# Patient Record
Sex: Female | Born: 1955 | Race: White | Hispanic: No | Marital: Married | Smoking: Former smoker
Health system: Southern US, Community
[De-identification: ages and names within clinical notes are randomized; demographics above are authoritative.]

## PROBLEM LIST (undated history)

## (undated) DIAGNOSIS — K9189 Other postprocedural complications and disorders of digestive system: Secondary | ICD-10-CM

## (undated) DIAGNOSIS — G4700 Insomnia, unspecified: Secondary | ICD-10-CM

## (undated) DIAGNOSIS — Z5189 Encounter for other specified aftercare: Secondary | ICD-10-CM

## (undated) DIAGNOSIS — Z8619 Personal history of other infectious and parasitic diseases: Secondary | ICD-10-CM

## (undated) DIAGNOSIS — R011 Cardiac murmur, unspecified: Secondary | ICD-10-CM

## (undated) DIAGNOSIS — M549 Dorsalgia, unspecified: Secondary | ICD-10-CM

## (undated) DIAGNOSIS — F319 Bipolar disorder, unspecified: Secondary | ICD-10-CM

## (undated) DIAGNOSIS — D508 Other iron deficiency anemias: Secondary | ICD-10-CM

## (undated) DIAGNOSIS — M5030 Other cervical disc degeneration, unspecified cervical region: Secondary | ICD-10-CM

## (undated) DIAGNOSIS — M797 Fibromyalgia: Secondary | ICD-10-CM

## (undated) DIAGNOSIS — G709 Myoneural disorder, unspecified: Secondary | ICD-10-CM

## (undated) DIAGNOSIS — N189 Chronic kidney disease, unspecified: Secondary | ICD-10-CM

## (undated) DIAGNOSIS — D649 Anemia, unspecified: Secondary | ICD-10-CM

## (undated) DIAGNOSIS — R519 Headache, unspecified: Secondary | ICD-10-CM

## (undated) DIAGNOSIS — I700 Atherosclerosis of aorta: Secondary | ICD-10-CM

## (undated) DIAGNOSIS — I471 Supraventricular tachycardia, unspecified: Secondary | ICD-10-CM

## (undated) DIAGNOSIS — F32A Depression, unspecified: Secondary | ICD-10-CM

## (undated) DIAGNOSIS — F329 Major depressive disorder, single episode, unspecified: Secondary | ICD-10-CM

## (undated) DIAGNOSIS — E785 Hyperlipidemia, unspecified: Secondary | ICD-10-CM

## (undated) DIAGNOSIS — T7840XA Allergy, unspecified, initial encounter: Secondary | ICD-10-CM

## (undated) DIAGNOSIS — M810 Age-related osteoporosis without current pathological fracture: Secondary | ICD-10-CM

## (undated) DIAGNOSIS — E162 Hypoglycemia, unspecified: Secondary | ICD-10-CM

## (undated) DIAGNOSIS — R51 Headache: Secondary | ICD-10-CM

## (undated) DIAGNOSIS — B1920 Unspecified viral hepatitis C without hepatic coma: Secondary | ICD-10-CM

## (undated) DIAGNOSIS — R32 Unspecified urinary incontinence: Secondary | ICD-10-CM

## (undated) DIAGNOSIS — I2510 Atherosclerotic heart disease of native coronary artery without angina pectoris: Secondary | ICD-10-CM

## (undated) DIAGNOSIS — M542 Cervicalgia: Secondary | ICD-10-CM

## (undated) DIAGNOSIS — K219 Gastro-esophageal reflux disease without esophagitis: Secondary | ICD-10-CM

## (undated) DIAGNOSIS — Z7982 Long term (current) use of aspirin: Secondary | ICD-10-CM

## (undated) DIAGNOSIS — M5416 Radiculopathy, lumbar region: Secondary | ICD-10-CM

## (undated) DIAGNOSIS — I779 Disorder of arteries and arterioles, unspecified: Secondary | ICD-10-CM

## (undated) DIAGNOSIS — K9589 Other complications of other bariatric procedure: Secondary | ICD-10-CM

## (undated) DIAGNOSIS — S92211A Displaced fracture of cuboid bone of right foot, initial encounter for closed fracture: Secondary | ICD-10-CM

## (undated) DIAGNOSIS — H34211 Partial retinal artery occlusion, right eye: Secondary | ICD-10-CM

## (undated) DIAGNOSIS — M1990 Unspecified osteoarthritis, unspecified site: Secondary | ICD-10-CM

## (undated) DIAGNOSIS — E079 Disorder of thyroid, unspecified: Secondary | ICD-10-CM

## (undated) DIAGNOSIS — F419 Anxiety disorder, unspecified: Secondary | ICD-10-CM

## (undated) HISTORY — DX: Anxiety disorder, unspecified: F41.9

## (undated) HISTORY — PX: PROSTATE SURGERY: SHX751

## (undated) HISTORY — DX: Dorsalgia, unspecified: M54.9

## (undated) HISTORY — PX: COSMETIC SURGERY: SHX468

## (undated) HISTORY — DX: Cervicalgia: M54.2

## (undated) HISTORY — PX: SPINE SURGERY: SHX786

## (undated) HISTORY — DX: Anemia, unspecified: D64.9

## (undated) HISTORY — DX: Chronic kidney disease, unspecified: N18.9

## (undated) HISTORY — DX: Age-related osteoporosis without current pathological fracture: M81.0

## (undated) HISTORY — DX: Radiculopathy, lumbar region: M54.16

## (undated) HISTORY — PX: CARDIOVERSION: SHX1299

## (undated) HISTORY — DX: Allergy, unspecified, initial encounter: T78.40XA

## (undated) HISTORY — DX: Depression, unspecified: F32.A

## (undated) HISTORY — DX: Disorder of thyroid, unspecified: E07.9

## (undated) HISTORY — DX: Displaced fracture of cuboid bone of right foot, initial encounter for closed fracture: S92.211A

## (undated) HISTORY — DX: Unspecified urinary incontinence: R32

## (undated) HISTORY — DX: Headache: R51

## (undated) HISTORY — DX: Unspecified osteoarthritis, unspecified site: M19.90

## (undated) HISTORY — DX: Hyperlipidemia, unspecified: E78.5

## (undated) HISTORY — DX: Gastro-esophageal reflux disease without esophagitis: K21.9

## (undated) HISTORY — DX: Headache, unspecified: R51.9

## (undated) HISTORY — DX: Major depressive disorder, single episode, unspecified: F32.9

## (undated) HISTORY — DX: Unspecified viral hepatitis C without hepatic coma: B19.20

## (undated) HISTORY — PX: DILATION AND CURETTAGE OF UTERUS: SHX78

---
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
## 1985-01-08 HISTORY — PX: TOTAL ABDOMINAL HYSTERECTOMY: SHX209

## 1985-01-08 HISTORY — PX: ABDOMINAL HYSTERECTOMY: SHX81

## 1995-01-09 DIAGNOSIS — G709 Myoneural disorder, unspecified: Secondary | ICD-10-CM

## 1995-01-09 HISTORY — PX: BREAST REDUCTION SURGERY: SHX8

## 1995-01-09 HISTORY — PX: REDUCTION MAMMAPLASTY: SUR839

## 1995-01-09 HISTORY — DX: Myoneural disorder, unspecified: G70.9

## 2003-01-09 HISTORY — PX: GASTRIC BYPASS: SHX52

## 2003-12-09 HISTORY — PX: IVC FILTER INSERTION: CATH118245

## 2003-12-24 HISTORY — PX: SMALL INTESTINE SURGERY: SHX150

## 2003-12-24 HISTORY — PX: GASTRIC BYPASS: SHX52

## 2004-03-08 HISTORY — PX: LAPAROSCOPIC CHOLECYSTECTOMY: SUR755

## 2004-03-08 HISTORY — PX: CHOLECYSTECTOMY: SHX55

## 2006-06-09 HISTORY — PX: LUMBAR LAMINECTOMY: SHX95

## 2008-12-08 HISTORY — PX: APPENDECTOMY: SHX54

## 2010-11-09 HISTORY — PX: SPINAL CORD STIMULATOR IMPLANT: SHX2422

## 2013-01-08 HISTORY — PX: AUGMENTATION MAMMAPLASTY: SUR837

## 2013-03-27 HISTORY — PX: SHOULDER ACROMIOPLASTY: SHX6093

## 2015-01-09 HISTORY — PX: EYE SURGERY: SHX253

## 2015-02-10 HISTORY — PX: OTHER SURGICAL HISTORY: SHX169

## 2015-05-18 HISTORY — PX: OTHER SURGICAL HISTORY: SHX169

## 2016-01-09 HISTORY — PX: AUGMENTATION MAMMAPLASTY: SUR837

## 2016-05-07 HISTORY — PX: OTHER SURGICAL HISTORY: SHX169

## 2016-07-16 DIAGNOSIS — E889 Metabolic disorder, unspecified: Secondary | ICD-10-CM | POA: Diagnosis not present

## 2016-07-16 DIAGNOSIS — M5440 Lumbago with sciatica, unspecified side: Secondary | ICD-10-CM | POA: Diagnosis not present

## 2016-07-16 DIAGNOSIS — E785 Hyperlipidemia, unspecified: Secondary | ICD-10-CM | POA: Diagnosis not present

## 2016-07-16 DIAGNOSIS — E1169 Type 2 diabetes mellitus with other specified complication: Secondary | ICD-10-CM | POA: Diagnosis not present

## 2016-07-26 DIAGNOSIS — M5440 Lumbago with sciatica, unspecified side: Secondary | ICD-10-CM | POA: Diagnosis not present

## 2016-07-26 DIAGNOSIS — M797 Fibromyalgia: Secondary | ICD-10-CM | POA: Diagnosis not present

## 2016-07-26 DIAGNOSIS — M47817 Spondylosis without myelopathy or radiculopathy, lumbosacral region: Secondary | ICD-10-CM | POA: Diagnosis not present

## 2016-07-26 DIAGNOSIS — Z79891 Long term (current) use of opiate analgesic: Secondary | ICD-10-CM | POA: Diagnosis not present

## 2016-07-26 DIAGNOSIS — R69 Illness, unspecified: Secondary | ICD-10-CM | POA: Diagnosis not present

## 2016-07-26 DIAGNOSIS — M4327 Fusion of spine, lumbosacral region: Secondary | ICD-10-CM | POA: Diagnosis not present

## 2016-07-26 DIAGNOSIS — G4700 Insomnia, unspecified: Secondary | ICD-10-CM | POA: Diagnosis not present

## 2016-07-26 DIAGNOSIS — M542 Cervicalgia: Secondary | ICD-10-CM | POA: Diagnosis not present

## 2016-08-01 DIAGNOSIS — E1169 Type 2 diabetes mellitus with other specified complication: Secondary | ICD-10-CM | POA: Diagnosis not present

## 2016-08-01 DIAGNOSIS — M5440 Lumbago with sciatica, unspecified side: Secondary | ICD-10-CM | POA: Diagnosis not present

## 2016-08-01 DIAGNOSIS — E889 Metabolic disorder, unspecified: Secondary | ICD-10-CM | POA: Diagnosis not present

## 2016-08-01 DIAGNOSIS — E785 Hyperlipidemia, unspecified: Secondary | ICD-10-CM | POA: Diagnosis not present

## 2016-08-14 ENCOUNTER — Ambulatory Visit: Payer: Medicare HMO | Attending: Nurse Practitioner | Admitting: Nurse Practitioner

## 2016-08-14 ENCOUNTER — Encounter: Payer: Self-pay | Admitting: Nurse Practitioner

## 2016-08-14 DIAGNOSIS — M25562 Pain in left knee: Secondary | ICD-10-CM | POA: Diagnosis not present

## 2016-08-14 DIAGNOSIS — M25559 Pain in unspecified hip: Secondary | ICD-10-CM

## 2016-08-14 DIAGNOSIS — E785 Hyperlipidemia, unspecified: Secondary | ICD-10-CM | POA: Insufficient documentation

## 2016-08-14 DIAGNOSIS — M5440 Lumbago with sciatica, unspecified side: Secondary | ICD-10-CM | POA: Diagnosis not present

## 2016-08-14 DIAGNOSIS — M79604 Pain in right leg: Secondary | ICD-10-CM | POA: Insufficient documentation

## 2016-08-14 DIAGNOSIS — M25552 Pain in left hip: Secondary | ICD-10-CM | POA: Diagnosis not present

## 2016-08-14 DIAGNOSIS — M79605 Pain in left leg: Secondary | ICD-10-CM | POA: Diagnosis not present

## 2016-08-14 DIAGNOSIS — E079 Disorder of thyroid, unspecified: Secondary | ICD-10-CM | POA: Diagnosis not present

## 2016-08-14 DIAGNOSIS — M545 Low back pain: Secondary | ICD-10-CM

## 2016-08-14 DIAGNOSIS — M542 Cervicalgia: Secondary | ICD-10-CM

## 2016-08-14 DIAGNOSIS — Z79891 Long term (current) use of opiate analgesic: Secondary | ICD-10-CM | POA: Diagnosis not present

## 2016-08-14 DIAGNOSIS — G8929 Other chronic pain: Secondary | ICD-10-CM | POA: Insufficient documentation

## 2016-08-14 DIAGNOSIS — M25551 Pain in right hip: Secondary | ICD-10-CM

## 2016-08-14 DIAGNOSIS — M533 Sacrococcygeal disorders, not elsewhere classified: Secondary | ICD-10-CM | POA: Diagnosis not present

## 2016-08-14 DIAGNOSIS — R69 Illness, unspecified: Secondary | ICD-10-CM | POA: Diagnosis not present

## 2016-08-14 DIAGNOSIS — Z9884 Bariatric surgery status: Secondary | ICD-10-CM | POA: Insufficient documentation

## 2016-08-14 DIAGNOSIS — F1190 Opioid use, unspecified, uncomplicated: Secondary | ICD-10-CM | POA: Insufficient documentation

## 2016-08-14 DIAGNOSIS — G894 Chronic pain syndrome: Secondary | ICD-10-CM | POA: Diagnosis not present

## 2016-08-14 DIAGNOSIS — M25512 Pain in left shoulder: Secondary | ICD-10-CM | POA: Diagnosis not present

## 2016-08-14 DIAGNOSIS — Z87891 Personal history of nicotine dependence: Secondary | ICD-10-CM | POA: Diagnosis not present

## 2016-08-14 NOTE — Progress Notes (Signed)
Safety precautions to be maintained throughout the outpatient stay will include: orient to surroundings, keep bed in low position, maintain call bell within reach at all times, provide assistance with transfer out of bed and ambulation.  

## 2016-08-14 NOTE — Progress Notes (Signed)
Patient's Name: Ashley Fields  MRN: [id number]  Referring Provider: Cletis Athens, MD  DOB: 28-Dec-1955  PCP: Cletis Athens, MD  DOS: 08/14/2016  Note by: Dionisio David NP  Service setting: Ambulatory outpatient  Specialty: Interventional Pain Management  Location: [facility] (AMB) Pain Management Facility    Patient type: New Patient    Primary Reason(s) for Visit: Initial Patient Evaluation CC: Back Pain (lower)  HPI  Ashley Fields is a 61 y.o. year old, female patient, who comes today for an initial evaluation. She has Chronic pain syndrome; Sacroiliac joint pain; Chronic neck pain ; Hip pain (Secondary Area of Pain) (bilateral) (L>R); Low back pain (Primary Area of Pain) (Bilateral)  (R>L); Long term current use of opiate analgesic; Long term prescription opiate use; Opiate use; Pain in joint, shoulder region; Lower extremity pain (Tertiary Area of Pain) (Bilateral) (L>R); and Chronic pain of left knee on her problem list.. Her primarily concern today is the Back Pain (lower)  Pain Assessment: Location: Lower Back Radiating: down the front of leg to the knee Onset: More than a month ago Duration: Chronic pain Quality: Aching, Constant, Radiating, Shooting, Sharp Severity: 8 /10 (self-reported pain score)  Note: Reported level is compatible with observation.                   Effect on ADL: pace self Timing: Constant Modifying factors: medicine  and rest by lying down  Onset and Duration: Gradual and Date of onset: 1990 Cause of pain: Work related accident or event Severity: Getting worse, NAS-11 at its worse: 9/10, NAS-11 at its best: 4/10, NAS-11 now: 7/10 and NAS-11 on the average: 8/10 Timing: Morning, Afternoon, Night, Not influenced by the time of the day, During activity or exercise, After activity or exercise and After a period of immobility Aggravating Factors: Bending, Climbing, Intercourse (sex), Kneeling, Lifiting, Motion, Prolonged sitting, Prolonged standing, Squatting, Stooping ,  Twisting, Walking, Walking uphill and Walking downhill Alleviating Factors: Cold packs, Hot packs, Lying down, Medications, Resting, Using a brace and Warm showers or baths Associated Problems: Constipation, Night-time cramps, Depression, Dizziness, Fatigue, Inability to concentrate, Inability to control bladder (urine), Numbness, Personality changes, Sadness, Spasms, Swelling, Temperature changes, Tingling, Weakness, Pain that wakes patient up and Pain that does not allow patient to sleep Quality of Pain: Aching, Agonizing, Annoying, Burning, Constant, Cruel, Deep, Disabling, Distressing, Dreadful, Dull, Exhausting, Feeling of constriction, Feeling of weight, Getting longer, Heavy, Horrible, Hot, Itching, Nagging, Pressure-like, Pulsating, Punishing, Sharp, Shooting, Sickening, Stabbing, Tender, Throbbing, Tingling, Tiring, Toothache-like and Uncomfortable Previous Examinations or Tests: Bone scan, CT scan, EMG/PNCV, MRI scan, Nerve block, X-rays, Neurological evaluation, Neurosurgical evaluation, Orthopedic evaluation, Chiropractic evaluation and Psychiatric evaluation Previous Treatments: Chiropractic manipulations, Epidural steroid injections, Facet blocks, Narcotic medications, Physical Therapy, Spinal cord stimulator, TENS and Trigger point injections  The patient comes into the clinics today for the first time for a chronic pain management evaluation. According to the patient her primary area of pain is in her lower back she admits that the right is greater than the left. She has radicular symptoms and goes to her knees. She is status post lumbar fusion 2008. She has had interventional procedures done in the past most recently 2017. Physical therapy last after spinal fusion. MRI completed in 2018 while living in [place].  Her next area of pain is in her hips. She admits the left is greater than the right. She denies any previous surgery, interventions, physical therapy or recent images.  Her third  area of pain  is in her lower extremities she admits that the left is greater than the right denies any previous surgeries, interventions, physical therapy or recent images.  Her fourth area of pain is in her neck. She is status post C3-C7 ACDF February 2017. She admits that the left side is greater than the right side. She denies any recent physical therapy Her last images completed in [place].  She admits that she does have pain in her right knee. She denies any surgeries, interventional, images or physical therapy  Today I took the time to provide the patient with information regarding this pain practice. The patient was informed that the practice is divided into two sections: an interventional pain management section, as well as a completely separate and distinct medication management section. I explained that there are procedure days for interventional therapies, and evaluation days for follow-ups and medication management. Because of the amount of documentation required during both, they are kept separated. This means that there is the possibility that she may be scheduled for a procedure on one day, and medication management the next. I have also informed her that because of staffing and facility limitations, this practice will no longer take patients for medication management only. To illustrate the reasons for this, I gave the patient the example of surgeons, and how inappropriate it would be to refer a patient to her care, just to write for the post-surgical antibiotics on a surgery done by a different surgeon.   Because interventional pain management is part of the board-certified specialty for the doctors, the patient was informed that joining this practice means that they are open to any and all interventional therapies. I made it clear that this does not mean that they will be forced to have any procedures done. What this means is that I believe interventional therapies to be essential part of  the diagnosis and proper management of chronic pain conditions. Therefore, patients not interested in these interventional alternatives will be better served under the care of a different practitioner.  The patient was also made aware of my Comprehensive Pain Management Safety Guidelines where by joining this practice, they limit all of their nerve blocks and joint injections to those done by our practice, for as long as we are retained to manage their care. Historic Controlled Substance Pharmacotherapy Review  PMP and historical list of controlled substances: Lorazepam 1 mg, tramadol 50 mg [place] recently just moved here from [facility] opioid analgesic regimen found: Tramadol 50 mg twice daily (fill date 07/16/2016) tramadol 100 mg daily Most recent opioid analgesic: Tramadol 50 mg twice daily (fill date 07/16/2016) tramadol 100 mg daily Current opioid analgesics:  Tramadol 50 mg twice daily (fill date 07/16/2016) tramadol 100 mg daily Highest recorded MME/day: 10 mg/day MME/day: 10 mg/day Medications: The patient did not bring the medication(s) to the appointment, as requested in our "New Patient Package" Pharmacodynamics: Desired effects: Analgesia: The patient reports >50% benefit. Reported improvement in function: The patient reports medication allows her to accomplish basic ADLs. Clinically meaningful improvement in function (CMIF): Sustained CMIF goals met Perceived effectiveness: Described as relatively effective, allowing for increase in activities of daily living (ADL) Undesirable effects: Side-effects or Adverse reactions: None reported Historical Monitoring: The patient  has no drug history on file. List of all UDS Test(s): No results found for: MDMA, COCAINSCRNUR, PCPSCRNUR, PCPQUANT, CANNABQUANT, THCU, [place] List of all Serum Drug Screening Test(s):  No results found for: AMPHSCRSER, BARBSCRSER, BENZOSCRSER, COCAINSCRSER, PCPSCRSER, PCPQUANT, THCSCRSER, CANNABQUANT,  OPIATESCRSER, OXYSCRSER,  PROPOXSCRSER Historical Background Evaluation: [place] PDMP: Six (6) year initial data search conducted.             [place] Department of public safety, offender search: Editor, commissioning Information) Non-contributory Risk Assessment Profile: Aberrant behavior: None observed or detected today Risk factors for fatal opioid overdose: Benzodiazepine use and concomitant use of Benzodiazepines Fatal overdose hazard ratio (HR): Calculation deferred Non-fatal overdose hazard ratio (HR): Calculation deferred Risk of opioid abuse or dependence: 0.7-3.0% with doses ? 36 MME/day and 6.1-26% with doses ? 120 MME/day. Substance use disorder (SUD) risk level: Pending results of Medical Psychology Evaluation for SUD Opioid risk tool (ORT) (Total Score): 1  ORT Scoring interpretation table:  Score <3 = Low Risk for SUD  Score between 4-7 = Moderate Risk for SUD  Score >8 = High Risk for Opioid Abuse   PHQ-2 Depression Scale:  Total score: 0  PHQ-2 Scoring interpretation table: (Score and probability of major depressive disorder)  Score 0 = No depression  Score 1 = 15.4% Probability  Score 2 = 21.1% Probability  Score 3 = 38.4% Probability  Score 4 = 45.5% Probability  Score 5 = 56.4% Probability  Score 6 = 78.6% Probability   PHQ-9 Depression Scale:  Total score: 0  PHQ-9 Scoring interpretation table:  Score 0-4 = No depression  Score 5-9 = Mild depression  Score 10-14 = Moderate depression  Score 15-19 = Moderately severe depression  Score 20-27 = Severe depression (2.4 times higher risk of SUD and 2.89 times higher risk of overuse)   Pharmacologic Plan: Pending ordered tests and/or consults  Meds  The patient has a current medication list which includes the following prescription(s): butalbital-acetaminophen-caffeine, fluticasone, gabapentin, lorazepam, omeprazole, oxycodone-acetaminophen, rexulti, sertraline, simvastatin, and tizanidine.  No current outpatient prescriptions on  file prior to visit.   No current facility-administered medications on file prior to visit.    Imaging Review   Note: No new results found.        ROS  Cardiovascular History: No reported cardiovascular signs or symptoms such as High blood pressure, coronary artery disease, abnormal heart rate or rhythm, heart attack, blood thinner therapy or heart weakness and/or failure Pulmonary or Respiratory History: No reported pulmonary signs or symptoms such as wheezing and difficulty taking a deep full breath (Asthma), difficulty blowing air out (Emphysema), coughing up mucus (Bronchitis), persistent dry cough, or temporary stoppage of breathing during sleep Neurological History: Curved spine Review of Past Neurological Studies: No results found for this or any previous visit. 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  Laboratory Chemistry  Inflammation Markers Lab Results  Component Value Date   CRP 0.7 08/14/2016   ESRSEDRATE 9 08/14/2016   (CRP: Acute Phase) (ESR: Chronic Phase) Renal Function Markers Lab Results  Component Value Date   BUN 17 08/14/2016   CREATININE 0.80 08/14/2016   GFRAA 93 08/14/2016   GFRNONAA 80 08/14/2016   Hepatic Function Markers Lab Results  Component  Value Date   AST 20  08/14/2016   ALT 10 08/14/2016   ALBUMIN 4.9 (H) 08/14/2016   ALKPHOS 103 08/14/2016   Electrolytes Lab Results  Component Value Date   NA 139 08/14/2016   K 4.1 08/14/2016   CL 97 08/14/2016   CALCIUM 9.6 08/14/2016   MG 2.3 08/14/2016   Neuropathy Markers Lab Results  Component Value Date   VITAMINB12 473 08/14/2016   Bone Pathology Markers Lab Results  Component Value Date   ALKPHOS 103 08/14/2016   25OHVITD1 47 08/14/2016   25OHVITD2 <1.0 08/14/2016   25OHVITD3 47 08/14/2016   CALCIUM 9.6 08/14/2016   Coagulation Parameters No results found for: INR, LABPROT, APTT, PLT Cardiovascular Markers No results found for: BNP, HGB, HCT Note: Lab results reviewed.  PFSH  Drug: Ashley Fields  has no drug history on file. Alcohol:  has no alcohol history on file. Tobacco:  reports that she has quit smoking. She quit after 4.00 years of use. She has never used smokeless tobacco. Medical:  has a past medical history of Allergy; Hyperlipidemia; and Thyroid disease. Family: family history includes COPD in her mother; Cancer in her mother; Heart disease in her father.  Past Surgical History:  Procedure Laterality Date  . 5 miscarriages    . ABDOMINAL HYSTERECTOMY    . acrominoplasty  2015  . APPENDECTOMY    . breast lift bilateral, implants    . BREAST REDUCTION SURGERY    . CESAREAN SECTION    . CHOLECYSTECTOMY    . GASTRIC BYPASS  2005  . LUMBAR LAMINECTOMY  2008   l4-l5   . mini tummy tuck    . neck surgery C3-C7    . spinal stimulator removed 2012  2012   Active Ambulatory Problems    Diagnosis Date Noted  . Chronic pain syndrome 08/14/2016  . Sacroiliac joint pain 08/14/2016  . Chronic neck pain  08/14/2016  . Hip pain (Secondary Area of Pain) (bilateral) (L>R) 08/14/2016  . Low back pain (Primary Area of Pain) (Bilateral)  (R>L) 08/14/2016  . Long term current use of opiate analgesic 08/14/2016  . Long term prescription opiate use  08/14/2016  . Opiate use 08/14/2016  . Pain in joint, shoulder region 08/14/2016  . Lower extremity pain [facility] Area of Pain) (Bilateral) (L>R) 08/20/2016  . Chronic pain of left knee 08/20/2016   Resolved Ambulatory Problems    Diagnosis Date Noted  . No Resolved Ambulatory Problems   Past Medical History:  Diagnosis Date  . Allergy   . Hyperlipidemia   . Thyroid disease    Constitutional Exam  General appearance: Well nourished, well developed, and well hydrated. In no apparent acute distress Vitals:   08/14/16 1410  BP: (!) 149/88  Pulse: 76  Resp: 16  Temp: 98.2 F (36.8 C)  SpO2: 100%  Weight: 184 lb (83.5 kg)  Height: 5' 2" (1.575 m)   BMI Assessment: Estimated body mass index is 33.65 kg/m as calculated from the following:   Height as of this encounter: 5' 2" (1.575 m).   Weight as of this encounter: 184 lb (83.5 kg).  BMI interpretation table: BMI level Category Range association with higher incidence of chronic pain  <18 kg/m2 Underweight   18.5-24.9 kg/m2 Ideal body weight   25-29.9 kg/m2 Overweight Increased incidence by 20%  30-34.9 kg/m2 Obese (Class I) Increased incidence by 68%  35-39.9 kg/m2 Severe obesity (Class II) Increased incidence by 136%  >40 kg/m2 Extreme obesity (Class III) Increased incidence by 254%   BMI  Readings from Last 4 Encounters:  08/14/16 33.65 kg/m   Wt Readings from Last 4 Encounters:  08/14/16 184 lb (83.5 kg)  Psych/Mental status: Alert, oriented x 3 (person, place, & time)       Eyes: PERLA Respiratory: No evidence of acute respiratory distress  Cervical Spine Exam  Inspection: Well healed scar from previous spine surgery detected Alignment: Symmetrical Functional ROM: Unrestricted ROM      Stability: No instability detected Muscle strength & Tone: Functionally intact Sensory: Unimpaired Palpation: No palpable anomalies              Upper Extremity (UE) Exam    Side: Right upper extremity  Side: Left upper  extremity  Inspection: No masses, redness, swelling, or asymmetry. No contractures  Inspection: No masses, redness, swelling, or asymmetry. No contractures  Functional ROM: Unrestricted ROM          Functional ROM: Unrestricted ROM          Muscle strength & Tone: Functionally intact  Muscle strength & Tone: Functionally intact  Sensory: Unimpaired  Sensory: Unimpaired  Palpation: No palpable anomalies              Palpation: Complains of area being tender to palpation              Specialized Test(s): Deferred         Specialized Test(s): Deferred          Thoracic Spine Exam  Inspection: No masses, redness, or swelling Alignment: Symmetrical Functional ROM: Unrestricted ROM Stability: No instability detected Sensory: Unimpaired Muscle strength & Tone: No palpable anomalies  Lumbar Spine Exam  Inspection: Well healed scar from previous spine surgery detected Alignment: Symmetrical Functional ROM: Unrestricted ROM      Stability: No instability detected Muscle strength & Tone: Functionally intact Sensory: Unimpaired Palpation: No palpable anomalies       Provocative Tests: Lumbar Hyperextension and rotation test: Positive bilaterally for facet joint pain. Patrick's Maneuver: Positive for bilateral S-I arthralgia and for bilateral hip arthralgia  Gait & Posture Assessment  Ambulation: Unassisted Gait: Relatively normal for age and body habitus Posture: WNL   Lower Extremity Exam    Side: Right lower extremity  Side: Left lower extremity  Inspection: No masses, redness, swelling, or asymmetry. No contractures  Inspection: No masses, redness, swelling, or asymmetry. No contractures  Functional ROM: Unrestricted ROM          Functional ROM: Unrestricted ROM          Muscle strength & Tone: Functionally intact  Muscle strength & Tone: Functionally intact  Sensory: Unimpaired  Sensory: Unimpaired  Palpation: No palpable anomalies  Palpation: Complains of area being tender to  palpation   Assessment  Primary Diagnosis & Pertinent Problem List: Diagnoses of Chronic pain syndrome, Sacroiliac joint pain, Pain in joint of left shoulder, Chronic neck pain, Pain of both hip joints, Pain in both lower extremities, Chronic bilateral low back pain, with sciatica presence unspecified, Long term current use of opiate analgesic, Long term prescription opiate use, Opiate use, and Chronic pain of left knee were pertinent to this visit.  Visit Diagnosis: 1. Chronic pain syndrome   2. Sacroiliac joint pain   3. Pain in joint of left shoulder   4. Chronic neck pain   5. Pain of both hip joints   6. Pain in both lower extremities   7. Chronic bilateral low back pain, with sciatica presence unspecified   8. Long term current use of  opiate analgesic   9. Long term prescription opiate use   10. Opiate use   11. Chronic pain of left knee    Plan of Care  Initial treatment plan:  Please be advised that as per protocol, today's visit has been an evaluation only. We have not taken over the patient's controlled substance management.  Problem-specific plan: No problem-specific Assessment & Plan notes found for this encounter.  Ordered Lab-work, Procedure(s), Referral(s), & Consult(s): Orders Placed This Encounter  Procedures  . DG Cervical Spine Complete  . DG HIP UNILAT W OR W/O PELVIS 2-3 VIEWS LEFT  . DG HIP UNILAT W OR W/O PELVIS 2-3 VIEWS RIGHT  . DG Knee 1-2 Views Left  . DG Si Joints  . DG Shoulder Left  . Compliance Drug Analysis, Ur  . Comprehensive metabolic panel  . C-reactive protein  . Sedimentation rate  . Magnesium  . 25-Hydroxyvitamin D Lcms D2+D3  . Vitamin B12  . Ambulatory referral to Psychology   Pharmacotherapy: Medications ordered:  No orders of the defined types were placed in this encounter.  Medications administered during this visit: Ashley Fields had no medications administered during this visit.   Pharmacotherapy under consideration:   Opioid Analgesics: The patient was informed that there is no guarantee that she would be a candidate for opioid analgesics. The decision will be made following CDC guidelines. This decision will be based on the results of diagnostic studies, as well as Ashley Fields's risk profile.  Membrane stabilizer: To be determined at a later time Muscle relaxant: To be determined at a later time NSAID: To be determined at a later time Other analgesic(s): To be determined at a later time   Interventional therapies under consideration: Ashley Fields was informed that there is no guarantee that she would be a candidate for interventional therapies. The decision will be based on the results of diagnostic studies, as well as Ashley Fields's risk profile.  Possible procedure(s): Possible diagnostic bilateral lumbar epidural steroid injection Possible diagnostic bilateral lumbar facet block radiofrequency ablation Possible diagnostic left hip injection Possible diagnostic femoral nerve block Possible diagnostic left knee intra-articular steroid injection Possible diagnostic left genicular nerve block Possible left knee radiofrequency ablation  Possible diagnostic bilateral cervical epidural steroid injection Possible bilateral cervical facet block Possible bilateral cervical radiofrequency ablation    Provider-requested follow-up: Return for 2nd Visit, w/ Dr. Dossie Arbour, after [facility] eval.  No future appointments.  Primary Care Physician: Cletis Athens, MD Location: [facility] Outpatient [facility] Management Facility Note by:  Date: 08/14/2016; Time: 1:12 PM  Pain Score Disclaimer: We use the NRS-11 scale. This is a self-reported, subjective measurement of pain severity with only modest accuracy. It is used primarily to identify changes within a particular patient. It must be understood that outpatient pain scales are significantly less accurate that those used for research, where they can be applied under ideal controlled  circumstances with minimal exposure to variables. In reality, the score is likely to be a combination of pain intensity and pain affect, where pain affect describes the degree of emotional arousal or changes in action readiness caused by the sensory experience of pain. Factors such as social and work situation, setting, emotional state, anxiety levels, expectation, and prior pain experience may influence pain perception and show large inter-individual differences that may also be affected by time variables.  Patient instructions provided during this appointment: Patient Instructions    ____________________________________________________________________________________________  Appointment Policy Summary  It is our goal and responsibility to provide the medical  community with assistance in the evaluation and management of patients with chronic pain. Unfortunately our resources are limited. Because we do not have an unlimited amount of time, or available appointments, we are required to closely monitor and manage their use. The following rules exist to maximize their use:  Patient's responsibilities: 1. Punctuality:  At what time should I arrive? You should be physically present in our office 30 minutes before your scheduled appointment. Your scheduled appointment is with your assigned healthcare provider. However, it takes 5-10 minutes to be "checked-in", and another 15 minutes for the nurses to do the admission. If you arrive to our office at the time you were given for your appointment, you will end up being at least 20-25 minutes late to your appointment with the provider. 2. Tardiness:  What happens if I arrive only a few minutes after my scheduled appointment time? You will need to reschedule your appointment. The cutoff is your appointment time. This is why it is so important that you arrive at least 30 minutes before that appointment. If you have an appointment scheduled for 10:00 AM and you  arrive at 10:01, you will be required to reschedule your appointment.  3. Plan ahead:  Always assume that you will encounter traffic on your way in. Plan for it. If you are dependent on a driver, make sure they understand these rules and the need to arrive early. 4. Other appointments and responsibilities:  Avoid scheduling any other appointments before or after your pain clinic appointments.  5. Be prepared:  Write down everything that you need to discuss with your healthcare provider and give this information to the admitting nurse. Write down the medications that you will need refilled. Bring your pills and bottles (even the empty ones), to all of your appointments, except for those where a procedure is scheduled. 6. No children or pets:  Find someone to take care of them. It is not appropriate to bring them in. 7. Scheduling changes:  We request "advanced notification" of any changes or cancellations. 8. Advanced notification:  Defined as a time period of more than 24 hours prior to the originally scheduled appointment. This allows for the appointment to be offered to other patients. 9. Rescheduling:  When a visit is rescheduled, it will require the cancellation of the original appointment. For this reason they both fall within the category of "Cancellations".  10. Cancellations:  They require advanced notification. Any cancellation less than 24 hours before the  appointment will be recorded as a "No Show". 11. No Show:  Defined as an unkept appointment where the patient failed to notify or declare to the practice their intention or inability to keep the appointment.  Corrective process for repeat offenders:  1. Tardiness: Three (3) episodes of rescheduling due to late arrivals will be recorded as one (1) "No Show". 2. Cancellation or reschedule: Three (3) cancellations or rescheduling will be recorded as one (1) "No Show". 3. "No Shows": Three (3) "No Shows" within a 12 month period will  result in discharge from the practice.  ____________________________________________________________________________________________  ____________________________________________________________________________________________  Pain Scale  Introduction: The pain score used by this practice is the Verbal Numerical Rating Scale (VNRS-11). This is an 11-point scale. It is for adults and children 10 years or older. There are significant differences in how the pain score is reported, used, and applied. Forget everything you learned in the past and learn this scoring system.  General Information: The scale should reflect your current level of  pain. Unless you are specifically asked for the level of your worst pain, or your average pain. If you are asked for one of these two, then it should be understood that it is over the past 24 hours.  Basic Activities of Daily Living (ADL): Personal hygiene, dressing, eating, transferring, and using restroom.  Instructions: Most patients tend to report their level of pain as a combination of two factors, their physical pain and their psychosocial pain. This last one is also known as "suffering" and it is reflection of how physical pain affects you socially and psychologically. From now on, report them separately. From this point on, when asked to report your pain level, report only your physical pain. Use the following table for reference.  Pain Clinic Pain Levels (0-5/10)  Pain Level Score  Description  No Pain 0   Mild pain 1 Nagging, annoying, but does not interfere with basic activities of daily living (ADL). Patients are able to eat, bathe, get dressed, toileting (being able to get on and off the toilet and perform personal hygiene functions), transfer (move in and out of bed or a chair without assistance), and maintain continence (able to control bladder and bowel functions). Blood pressure and heart rate are unaffected. A normal heart rate for a healthy adult  ranges from 60 to 100 bpm (beats per minute).   Mild to moderate pain 2 Noticeable and distracting. Impossible to hide from other people. More frequent flare-ups. Still possible to adapt and function close to normal. It can be very annoying and may have occasional stronger flare-ups. With discipline, patients may get used to it and adapt.   Moderate pain 3 Interferes significantly with activities of daily living (ADL). It becomes difficult to feed, bathe, get dressed, get on and off the toilet or to perform personal hygiene functions. Difficult to get in and out of bed or a chair without assistance. Very distracting. With effort, it can be ignored when deeply involved in activities.   Moderately severe pain 4 Impossible to ignore for more than a few minutes. With effort, patients may still be able to manage work or participate in some social activities. Very difficult to concentrate. Signs of autonomic nervous system discharge are evident: dilated pupils (mydriasis); mild sweating (diaphoresis); sleep interference. Heart rate becomes elevated (>115 bpm). Diastolic blood pressure (lower number) rises above 100 mmHg. Patients find relief in laying down and not moving.   Severe pain 5 Intense and extremely unpleasant. Associated with frowning face and frequent crying. Pain overwhelms the senses.  Ability to do any activity or maintain social relationships becomes significantly limited. Conversation becomes difficult. Pacing back and forth is common, as getting into a comfortable position is nearly impossible. Pain wakes you up from deep sleep. Physical signs will be obvious: pupillary dilation; increased sweating; goosebumps; brisk reflexes; cold, clammy hands and feet; nausea, vomiting or dry heaves; loss of appetite; significant sleep disturbance with inability to fall asleep or to remain asleep. When persistent, significant weight loss is observed due to the complete loss of appetite and sleep deprivation.   Blood pressure and heart rate becomes significantly elevated. Caution: If elevated blood pressure triggers a pounding headache associated with blurred vision, then the patient should immediately seek attention at an urgent or emergency care unit, as these may be signs of an impending stroke.    Emergency Department Pain Levels (6-10/10)  Emergency Room Pain 6 Severely limiting. Requires emergency care and should not be seen or managed at an outpatient  pain management facility. Communication becomes difficult and requires great effort. Assistance to reach the emergency department may be required. Facial flushing and profuse sweating along with potentially dangerous increases in heart rate and blood pressure will be evident.   Distressing pain 7 Self-care is very difficult. Assistance is required to transport, or use restroom. Assistance to reach the emergency department will be required. Tasks requiring coordination, such as bathing and getting dressed become very difficult.   Disabling pain 8 Self-care is no longer possible. At this level, pain is disabling. The individual is unable to do even the most "basic" activities such as walking, eating, bathing, dressing, transferring to a bed, or toileting. Fine motor skills are lost. It is difficult to think clearly.   Incapacitating pain 9 Pain becomes incapacitating. Thought processing is no longer possible. Difficult to remember your own name. Control of movement and coordination are lost.   The worst pain imaginable 10 At this level, most patients pass out from pain. When this level is reached, collapse of the autonomic nervous system occurs, leading to a sudden drop in blood pressure and heart rate. This in turn results in a temporary and dramatic drop in blood flow to the brain, leading to a loss of consciousness. Fainting is one of the body's self defense mechanisms. Passing out puts the brain in a calmed state and causes it to shut down for a while,  in order to begin the healing process.    Summary: 1. Refer to this scale when providing [place] with your pain level. 2. Be accurate and careful when reporting your pain level. This will help with your care. 3. Over-reporting your pain level will lead to loss of credibility. 4. Even a level of 1/10 means that there is pain and will be treated at our facility. 5. High, inaccurate reporting will be documented as "Symptom Exaggeration", leading to loss of credibility and suspicions of possible secondary gains such as obtaining more narcotics, or wanting to appear disabled, for fraudulent reasons. 6. Only pain levels of 5 or below will be seen at our facility. 7. Pain levels of 6 and above will be sent to the Emergency Department and the appointment cancelled. ____________________________________________________________________________________________  Pain Management Discharge Instructions  General Discharge Instructions :  If you need to reach your doctor call: Monday-Friday 8:00 am - 4:00 pm at [phone] or toll free [phone].  After clinic hours [phone] to have operator reach doctor.  Bring all of your medication bottles to all your appointments in the pain clinic.  To cancel or reschedule your appointment with Pain Management please remember to call 24 hours in advance to avoid a fee.  Refer to the educational materials which you have been given on: General Risks, I had my Procedure. Discharge Instructions, Post Sedation.  Post Procedure Instructions:  The drugs you were given will stay in your system until tomorrow, so for the next 24 hours you should not drive, make any legal decisions or drink any alcoholic beverages.  You may eat anything you prefer, but it is better to start with liquids then soups and crackers, and gradually work up to solid foods.  Please notify your doctor immediately if you have any unusual bleeding, trouble breathing or pain that is not related to  your normal pain.  Depending on the type of procedure that was done, some parts of your body may feel week and/or numb.  This usually clears up by tonight or the next day.  Walk with the use  of an assistive device or accompanied by an adult for the 24 hours.  You may use ice on the affected area for the first 24 hours.  Put ice in a Ziploc bag and cover with a towel and place against area 15 minutes on 15 minutes off.  You may switch to heat after 24 hours.

## 2016-08-14 NOTE — Patient Instructions (Addendum)
____________________________________________________________________________________________  Appointment Policy Summary  It is our goal and responsibility to provide the medical community with assistance in the evaluation and management of patients with chronic pain. Unfortunately our resources are limited. Because we do not have an unlimited amount of time, or available appointments, we are required to closely monitor and manage their use. The following rules exist to maximize their use:  Patient's responsibilities: 1. Punctuality:  At what time should I arrive? You should be physically present in our office 30 minutes before your scheduled appointment. Your scheduled appointment is with your assigned healthcare provider. However, it takes 5-10 minutes to be "checked-in", and another 15 minutes for the nurses to do the admission. If you arrive to our office at the time you were given for your appointment, you will end up being at least 20-25 minutes late to your appointment with the provider. 2. Tardiness:  What happens if I arrive only a few minutes after my scheduled appointment time? You will need to reschedule your appointment. The cutoff is your appointment time. This is why it is so important that you arrive at least 30 minutes before that appointment. If you have an appointment scheduled for 10:00 AM and you arrive at 10:01, you will be required to reschedule your appointment.  3. Plan ahead:  Always assume that you will encounter traffic on your way in. Plan for it. If you are dependent on a driver, make sure they understand these rules and the need to arrive early. 4. Other appointments and responsibilities:  Avoid scheduling any other appointments before or after your pain clinic appointments.  5. Be prepared:  Write down everything that you need to discuss with your healthcare provider and give this information to the admitting nurse. Write down the medications that you will need  refilled. Bring your pills and bottles (even the empty ones), to all of your appointments, except for those where a procedure is scheduled. 6. No children or pets:  Find someone to take care of them. It is not appropriate to bring them in. 7. Scheduling changes:  We request "advanced notification" of any changes or cancellations. 8. Advanced notification:  Defined as a time period of more than 24 hours prior to the originally scheduled appointment. This allows for the appointment to be offered to other patients. 9. Rescheduling:  When a visit is rescheduled, it will require the cancellation of the original appointment. For this reason they both fall within the category of "Cancellations".  10. Cancellations:  They require advanced notification. Any cancellation less than 24 hours before the  appointment will be recorded as a "No Show". 11. No Show:  Defined as an unkept appointment where the patient failed to notify or declare to the practice their intention or inability to keep the appointment.  Corrective process for repeat offenders:  1. Tardiness: Three (3) episodes of rescheduling due to late arrivals will be recorded as one (1) "No Show". 2. Cancellation or reschedule: Three (3) cancellations or rescheduling will be recorded as one (1) "No Show". 3. "No Shows": Three (3) "No Shows" within a 12 month period will result in discharge from the practice.  ____________________________________________________________________________________________  ____________________________________________________________________________________________  Pain Scale  Introduction: The pain score used by this practice is the Verbal Numerical Rating Scale (VNRS-11). This is an 11-point scale. It is for adults and children 10 years or older. There are significant differences in how the pain score is reported, used, and applied. Forget everything you learned in the past and  learn this scoring  system.  General Information: The scale should reflect your current level of pain. Unless you are specifically asked for the level of your worst pain, or your average pain. If you are asked for one of these two, then it should be understood that it is over the past 24 hours.  Basic Activities of Daily Living (ADL): Personal hygiene, dressing, eating, transferring, and using restroom.  Instructions: Most patients tend to report their level of pain as a combination of two factors, their physical pain and their psychosocial pain. This last one is also known as "suffering" and it is reflection of how physical pain affects you socially and psychologically. From now on, report them separately. From this point on, when asked to report your pain level, report only your physical pain. Use the following table for reference.  Pain Clinic Pain Levels (0-5/10)  Pain Level Score  Description  No Pain 0   Mild pain 1 Nagging, annoying, but does not interfere with basic activities of daily living (ADL). Patients are able to eat, bathe, get dressed, toileting (being able to get on and off the toilet and perform personal hygiene functions), transfer (move in and out of bed or a chair without assistance), and maintain continence (able to control bladder and bowel functions). Blood pressure and heart rate are unaffected. A normal heart rate for a healthy adult ranges from 60 to 100 bpm (beats per minute).   Mild to moderate pain 2 Noticeable and distracting. Impossible to hide from other people. More frequent flare-ups. Still possible to adapt and function close to normal. It can be very annoying and may have occasional stronger flare-ups. With discipline, patients may get used to it and adapt.   Moderate pain 3 Interferes significantly with activities of daily living (ADL). It becomes difficult to feed, bathe, get dressed, get on and off the toilet or to perform personal hygiene functions. Difficult to get in and out of  bed or a chair without assistance. Very distracting. With effort, it can be ignored when deeply involved in activities.   Moderately severe pain 4 Impossible to ignore for more than a few minutes. With effort, patients may still be able to manage work or participate in some social activities. Very difficult to concentrate. Signs of autonomic nervous system discharge are evident: dilated pupils (mydriasis); mild sweating (diaphoresis); sleep interference. Heart rate becomes elevated (>115 bpm). Diastolic blood pressure (lower number) rises above 100 mmHg. Patients find relief in laying down and not moving.   Severe pain 5 Intense and extremely unpleasant. Associated with frowning face and frequent crying. Pain overwhelms the senses.  Ability to do any activity or maintain social relationships becomes significantly limited. Conversation becomes difficult. Pacing back and forth is common, as getting into a comfortable position is nearly impossible. Pain wakes you up from deep sleep. Physical signs will be obvious: pupillary dilation; increased sweating; goosebumps; brisk reflexes; cold, clammy hands and feet; nausea, vomiting or dry heaves; loss of appetite; significant sleep disturbance with inability to fall asleep or to remain asleep. When persistent, significant weight loss is observed due to the complete loss of appetite and sleep deprivation.  Blood pressure and heart rate becomes significantly elevated. Caution: If elevated blood pressure triggers a pounding headache associated with blurred vision, then the patient should immediately seek attention at an urgent or emergency care unit, as these may be signs of an impending stroke.    Emergency Department Pain Levels (6-10/10)  Emergency Room Pain 6   Severely limiting. Requires emergency care and should not be seen or managed at an outpatient pain management facility. Communication becomes difficult and requires great effort. Assistance to reach the  emergency department may be required. Facial flushing and profuse sweating along with potentially dangerous increases in heart rate and blood pressure will be evident.   Distressing pain 7 Self-care is very difficult. Assistance is required to transport, or use restroom. Assistance to reach the emergency department will be required. Tasks requiring coordination, such as bathing and getting dressed become very difficult.   Disabling pain 8 Self-care is no longer possible. At this level, pain is disabling. The individual is unable to do even the most "basic" activities such as walking, eating, bathing, dressing, transferring to a bed, or toileting. Fine motor skills are lost. It is difficult to think clearly.   Incapacitating pain 9 Pain becomes incapacitating. Thought processing is no longer possible. Difficult to remember your own name. Control of movement and coordination are lost.   The worst pain imaginable 10 At this level, most patients pass out from pain. When this level is reached, collapse of the autonomic nervous system occurs, leading to a sudden drop in blood pressure and heart rate. This in turn results in a temporary and dramatic drop in blood flow to the brain, leading to a loss of consciousness. Fainting is one of the body's self defense mechanisms. Passing out puts the brain in a calmed state and causes it to shut down for a while, in order to begin the healing process.    Summary: 1. Refer to this scale when providing us with your pain level. 2. Be accurate and careful when reporting your pain level. This will help with your care. 3. Over-reporting your pain level will lead to loss of credibility. 4. Even a level of 1/10 means that there is pain and will be treated at our facility. 5. High, inaccurate reporting will be documented as "Symptom Exaggeration", leading to loss of credibility and suspicions of possible secondary gains such as obtaining more narcotics, or wanting to appear  disabled, for fraudulent reasons. 6. Only pain levels of 5 or below will be seen at our facility. 7. Pain levels of 6 and above will be sent to the Emergency Department and the appointment cancelled. ____________________________________________________________________________________________  Pain Management Discharge Instructions  General Discharge Instructions :  If you need to reach your doctor call: Monday-Friday 8:00 am - 4:00 pm at [phone] or toll free [phone].  After clinic hours [phone] to have operator reach doctor.  Bring all of your medication bottles to all your appointments in the pain clinic.  To cancel or reschedule your appointment with Pain Management please remember to call 24 hours in advance to avoid a fee.  Refer to the educational materials which you have been given on: General Risks, I had my Procedure. Discharge Instructions, Post Sedation.  Post Procedure Instructions:  The drugs you were given will stay in your system until tomorrow, so for the next 24 hours you should not drive, make any legal decisions or drink any alcoholic beverages.  You may eat anything you prefer, but it is better to start with liquids then soups and crackers, and gradually work up to solid foods.  Please notify your doctor immediately if you have any unusual bleeding, trouble breathing or pain that is not related to your normal pain.  Depending on the type of procedure that was done, some parts of your body may feel week and/or numb.    This usually clears up by tonight or the next day.  Walk with the use of an assistive device or accompanied by an adult for the 24 hours.  You may use ice on the affected area for the first 24 hours.  Put ice in a Ziploc bag and cover with a towel and place against area 15 minutes on 15 minutes off.  You may switch to heat after 24 hours. 

## 2016-08-16 ENCOUNTER — Ambulatory Visit
Admission: RE | Admit: 2016-08-16 | Discharge: 2016-08-16 | Disposition: A | Discharge status: Home / Self Care | Payer: Medicare HMO | Source: Ambulatory Visit | Attending: Nurse Practitioner | Admitting: Nurse Practitioner

## 2016-08-16 DIAGNOSIS — M1611 Unilateral primary osteoarthritis, right hip: Secondary | ICD-10-CM | POA: Diagnosis not present

## 2016-08-16 DIAGNOSIS — Z79891 Long term (current) use of opiate analgesic: Secondary | ICD-10-CM | POA: Diagnosis not present

## 2016-08-16 DIAGNOSIS — M533 Sacrococcygeal disorders, not elsewhere classified: Secondary | ICD-10-CM | POA: Insufficient documentation

## 2016-08-16 DIAGNOSIS — G8929 Other chronic pain: Secondary | ICD-10-CM

## 2016-08-16 DIAGNOSIS — M25551 Pain in right hip: Secondary | ICD-10-CM

## 2016-08-16 DIAGNOSIS — M25552 Pain in left hip: Secondary | ICD-10-CM | POA: Diagnosis present

## 2016-08-16 DIAGNOSIS — Z981 Arthrodesis status: Secondary | ICD-10-CM | POA: Diagnosis not present

## 2016-08-16 DIAGNOSIS — M5031 Other cervical disc degeneration,  high cervical region: Secondary | ICD-10-CM | POA: Diagnosis not present

## 2016-08-16 DIAGNOSIS — M25512 Pain in left shoulder: Secondary | ICD-10-CM

## 2016-08-16 DIAGNOSIS — M25562 Pain in left knee: Secondary | ICD-10-CM | POA: Diagnosis not present

## 2016-08-16 DIAGNOSIS — M1612 Unilateral primary osteoarthritis, left hip: Secondary | ICD-10-CM | POA: Diagnosis not present

## 2016-08-16 DIAGNOSIS — M160 Bilateral primary osteoarthritis of hip: Secondary | ICD-10-CM | POA: Diagnosis not present

## 2016-08-16 DIAGNOSIS — M542 Cervicalgia: Principal | ICD-10-CM

## 2016-08-17 LAB — COMPLIANCE DRUG ANALYSIS, UR

## 2016-08-20 DIAGNOSIS — M25562 Pain in left knee: Secondary | ICD-10-CM

## 2016-08-20 DIAGNOSIS — G8929 Other chronic pain: Secondary | ICD-10-CM | POA: Insufficient documentation

## 2016-08-20 LAB — COMPREHENSIVE METABOLIC PANEL
A/G RATIO: 1.9 (ref 1.2–2.2)
ALBUMIN: 4.9 g/dL — AB (ref 3.6–4.8)
ALT: 10 IU/L (ref 0–32)
AST: 20 IU/L (ref 0–40)
Alkaline Phosphatase: 103 IU/L (ref 39–117)
BUN/Creatinine Ratio: 21 (ref 12–28)
BUN: 17 mg/dL (ref 8–27)
Bilirubin Total: 0.2 mg/dL (ref 0.0–1.2)
CALCIUM: 9.6 mg/dL (ref 8.7–10.3)
CO2: 25 mmol/L (ref 20–29)
Chloride: 97 mmol/L (ref 96–106)
Creatinine, Ser: 0.8 mg/dL (ref 0.57–1.00)
GFR, EST AFRICAN AMERICAN: 93 mL/min/{1.73_m2} (ref >59)
GFR, EST NON AFRICAN AMERICAN: 80 mL/min/{1.73_m2} (ref >59)
Globulin, Total: 2.6 g/dL (ref 1.5–4.5)
Glucose: 94 mg/dL (ref 65–99)
POTASSIUM: 4.1 mmol/L (ref 3.5–5.2)
Sodium: 139 mmol/L (ref 134–144)
TOTAL PROTEIN: 7.5 g/dL (ref 6.0–8.5)

## 2016-08-20 LAB — 25-HYDROXY VITAMIN D LCMS D2+D3: 25-Hydroxy, Vitamin D-2: <1 ng/mL

## 2016-08-20 LAB — VITAMIN B12: VITAMIN B 12: 473 pg/mL (ref 232–1245)

## 2016-08-20 LAB — 25-HYDROXYVITAMIN D LCMS D2+D3
25-HYDROXY, VITAMIN D-3: 47 ng/mL
25-HYDROXY, VITAMIN D: 47 ng/mL

## 2016-08-20 LAB — SEDIMENTATION RATE: Sed Rate: 9 mm/hr (ref 0–40)

## 2016-08-20 LAB — MAGNESIUM: MAGNESIUM: 2.3 mg/dL (ref 1.6–2.3)

## 2016-08-20 LAB — C-REACTIVE PROTEIN: CRP: 0.7 mg/L (ref 0.0–4.9)

## 2016-08-21 DIAGNOSIS — F4310 Post-traumatic stress disorder, unspecified: Secondary | ICD-10-CM | POA: Diagnosis not present

## 2016-08-21 DIAGNOSIS — R69 Illness, unspecified: Secondary | ICD-10-CM | POA: Diagnosis not present

## 2016-08-21 DIAGNOSIS — F411 Generalized anxiety disorder: Secondary | ICD-10-CM | POA: Diagnosis not present

## 2016-08-21 NOTE — Progress Notes (Signed)
Results were reviewed and found to be: mildly abnormal  No acute injury or pathology identified  Review would suggest interventional pain management techniques may be of benefit 

## 2016-08-24 DIAGNOSIS — M47817 Spondylosis without myelopathy or radiculopathy, lumbosacral region: Secondary | ICD-10-CM | POA: Diagnosis not present

## 2016-08-24 DIAGNOSIS — R69 Illness, unspecified: Secondary | ICD-10-CM | POA: Diagnosis not present

## 2016-08-24 DIAGNOSIS — M542 Cervicalgia: Secondary | ICD-10-CM | POA: Diagnosis not present

## 2016-08-24 DIAGNOSIS — G4700 Insomnia, unspecified: Secondary | ICD-10-CM | POA: Diagnosis not present

## 2016-08-24 DIAGNOSIS — M797 Fibromyalgia: Secondary | ICD-10-CM | POA: Diagnosis not present

## 2016-08-24 DIAGNOSIS — Z79891 Long term (current) use of opiate analgesic: Secondary | ICD-10-CM | POA: Diagnosis not present

## 2016-08-24 DIAGNOSIS — M4327 Fusion of spine, lumbosacral region: Secondary | ICD-10-CM | POA: Diagnosis not present

## 2016-08-24 DIAGNOSIS — M5440 Lumbago with sciatica, unspecified side: Secondary | ICD-10-CM | POA: Diagnosis not present

## 2016-08-31 ENCOUNTER — Other Ambulatory Visit: Payer: Self-pay | Admitting: Internal Medicine

## 2016-08-31 DIAGNOSIS — E1169 Type 2 diabetes mellitus with other specified complication: Secondary | ICD-10-CM | POA: Diagnosis not present

## 2016-08-31 DIAGNOSIS — E785 Hyperlipidemia, unspecified: Secondary | ICD-10-CM | POA: Diagnosis not present

## 2016-08-31 DIAGNOSIS — E889 Metabolic disorder, unspecified: Secondary | ICD-10-CM | POA: Diagnosis not present

## 2016-08-31 DIAGNOSIS — M5440 Lumbago with sciatica, unspecified side: Secondary | ICD-10-CM | POA: Diagnosis not present

## 2016-08-31 DIAGNOSIS — R109 Unspecified abdominal pain: Secondary | ICD-10-CM

## 2016-09-06 DIAGNOSIS — G894 Chronic pain syndrome: Secondary | ICD-10-CM | POA: Diagnosis not present

## 2016-09-06 DIAGNOSIS — N939 Abnormal uterine and vaginal bleeding, unspecified: Secondary | ICD-10-CM | POA: Diagnosis not present

## 2016-09-06 DIAGNOSIS — E785 Hyperlipidemia, unspecified: Secondary | ICD-10-CM | POA: Diagnosis not present

## 2016-09-06 DIAGNOSIS — M489 Spondylopathy, unspecified: Secondary | ICD-10-CM | POA: Diagnosis not present

## 2016-09-07 ENCOUNTER — Ambulatory Visit
Admission: RE | Admit: 2016-09-07 | Discharge: 2016-09-07 | Disposition: A | Discharge status: Home / Self Care | Payer: Medicare HMO | Source: Ambulatory Visit | Attending: Internal Medicine | Admitting: Internal Medicine

## 2016-09-17 ENCOUNTER — Ambulatory Visit: Payer: Medicare HMO

## 2016-09-18 DIAGNOSIS — M79604 Pain in right leg: Secondary | ICD-10-CM

## 2016-09-18 DIAGNOSIS — M5442 Lumbago with sciatica, left side: Secondary | ICD-10-CM

## 2016-09-18 DIAGNOSIS — M160 Bilateral primary osteoarthritis of hip: Secondary | ICD-10-CM | POA: Insufficient documentation

## 2016-09-18 DIAGNOSIS — M79605 Pain in left leg: Secondary | ICD-10-CM

## 2016-09-18 DIAGNOSIS — M5441 Lumbago with sciatica, right side: Secondary | ICD-10-CM

## 2016-09-18 DIAGNOSIS — Z79899 Other long term (current) drug therapy: Secondary | ICD-10-CM | POA: Insufficient documentation

## 2016-09-18 DIAGNOSIS — G8929 Other chronic pain: Secondary | ICD-10-CM | POA: Insufficient documentation

## 2016-09-18 NOTE — Progress Notes (Signed)
Patient's Name: Ashley Fields  MRN: [id number]  Referring Provider: Cletis Athens, MD  DOB: 27-Jul-1955  PCP: Cletis Athens, MD  DOS: 09/19/2016  Note by: Gaspar Cola, MD  Service setting: Ambulatory outpatient  Specialty: Interventional Pain Management  Location: [facility] (AMB) Pain Management Facility    Patient type: Established   Primary Reason(s) for Visit: Encounter for evaluation before starting new chronic pain management plan of care (Level of risk: moderate) CC: Back Pain (lower, left knee goes numb)  HPI  [name] is a 61 y.o. year old, female patient, who comes today for a follow-up evaluation to review the test results and decide on a treatment plan. She has Chronic pain syndrome; Chronic neck pain ; Chronic hip pain (Secondary Area of Pain) (Bilateral) (L>R); Long term current use of opiate analgesic; Long term prescription opiate use; Opiate use; Chronic knee pain (Left); Osteoarthritis of hip (Bilateral); Chronic Low back pain (Primary Area of Pain) (Bilateral)  (R>L); Chronic pain of lower extremity (Tertiary Area of Pain) (Bilateral) (L>R); Long term prescription benzodiazepine use; Grade 1 Anterolisthesis of L3 over L4; Failed back surgical syndrome; Chronic shoulder pain (Left); History of cervical fusion (ACDF C4-C7); DDD (degenerative disc disease), cervical; and Chronic sacroiliac joint pain on her problem list. Her primarily concern today is the Back Pain (lower, left knee goes numb)  Pain Assessment: Location: Lower Back Radiating: pain goes down the left side of the leg to the knee and at times knee is numb esp when up moving; also  pain in both hips   Onset: More than a month ago Duration: Chronic pain Quality: Aching, Constant, Radiating, Shooting (shooting pain when moving on both hips) Severity: 7 /10 (self-reported pain score)  Note: Reported level is inconsistent with clinical observations. Clinically the patient looks like a 2/10 Information on the proper use of  the pain scale provided to the patient today Effect on ADL: pace self Timing: Constant Modifying factors: medicine, muscle relaxer  [name] comes in today for a follow-up visit after her initial evaluation on 08/14/2016. Today we went over the results of her tests. These were explained in "Layman's terms". During today's appointment we went over my diagnostic impression, as well as the proposed treatment plan.  According to the patient her primary area of pain is in her lower back she admits that the right is greater than the left. She has radicular symptoms and goes to her knees. She is status post lumbar fusion 2008. She has had interventional procedures done in the past most recently 2017. Physical therapy last after spinal fusion. MRI completed in 2018 while living in [place].  Her next area of pain is in her hips. She admits the left is greater than the right. She denies any previous surgery, interventions, physical therapy or recent images.  Her third area of pain is in her lower extremities she admits that the left is greater than the right denies any previous surgeries, interventions, physical therapy or recent images.  Her fourth area of pain is in her neck. She is status post C3-C7 ACDF February 2017. She admits that the left side is greater than the right side. She denies any recent physical therapy Her last images completed in [place].  She admits that she does have pain in her right knee. She denies any surgeries, interventional, images or physical therapy  In considering the treatment plan options, [name] was reminded that I no longer take patients for medication management only. I asked her to  let me know if she had no intention of taking advantage of the interventional therapies, so that we could make arrangements to provide this space to someone interested. I also made it clear that undergoing interventional therapies for the purpose of getting pain medications is very  inappropriate on the part of a patient, and it will not be tolerated in this practice. This type of behavior would suggest true addiction and therefore it requires referral to an addiction specialist.    Time Note: Greater than 50% of the 40 minute(s) of face-to-face time spent with [name], was spent in counseling/coordination of care regarding: the appropriate use of the pain scale, [name]'s primary cause of pain, the results of her recent test(s), the significance of each one oth the test(s) anomalies and it's corresponding characteristic pain pattern(s), the treatment plan, treatment alternatives, the risks and possible complications of proposed treatment and medication side effects.  Further details on both, my assessment(s), as well as the proposed treatment plan, please see below.  Controlled Substance Pharmacotherapy Assessment REMS (Risk Evaluation and Mitigation Strategy)  Analgesic: Tramadol 50 mg twice daily (fill date 07/16/2016) tramadol 100 mg daily Highest recorded MME/day: 10 mg/day MME/day: 10 mg/day Pill Count: None expected due to no prior prescriptions written by our practice. Lona Millard, RN  09/19/2016  8:23 AM  Sign at close encounter Safety precautions to be maintained throughout the outpatient stay will include: orient to surroundings, keep bed in low position, maintain call bell within reach at all times, provide assistance with transfer out of bed and ambulation.    Pharmacokinetics: Liberation and absorption (onset of action): WNL Distribution (time to peak effect): WNL Metabolism and excretion (duration of action): WNL         Pharmacodynamics: Desired effects: Analgesia: [name] reports >50% benefit. Functional ability: Patient reports that medication allows her to accomplish basic ADLs Clinically meaningful improvement in function (CMIF): Sustained CMIF goals met Perceived effectiveness: Described as relatively effective, allowing for increase in  activities of daily living (ADL) Undesirable effects: Side-effects or Adverse reactions: None reported Monitoring: [place] PMP: Online review of the past 45-monthperiod previously conducted. Not applicable at this point since we have not taken over the patient's medication management yet. List of all Serum Drug Screening Test(s):  No results found for: AMPHSCRSER, BARBSCRSER, BENZOSCRSER, COCAINSCRSER, PCPSCRSER, THCSCRSER, OPIATESCRSER, [place] PBerkeleyList of all UDS test(s) done:  Lab Results  Component Value Date   SUMMARY FINAL 08/14/2016   Last UDS on record: Summary  Date Value Ref Range Status  08/14/2016 FINAL  Final    Comment:    ==================================================================== TOXASSURE COMP DRUG ANALYSIS,UR ==================================================================== Test                             Result       Flag       Units Drug Present and Declared for Prescription Verification   Lorazepam                      2290         EXPECTED   ng/mg creat    Source of lorazepam is a scheduled prescription medication.   Oxymorphone                    683          EXPECTED   ng/mg creat   Noroxycodone  3024         EXPECTED   ng/mg creat   Noroxymorphone                 1675         EXPECTED   ng/mg creat    Oxymorphone, noroxycodone and noroxymorphone are expected    metabolites of oxycodone. Noroxymorphone is an expected    metabolite of oxymorphone. Sources of oxycodone and/or    oxymorphone include scheduled prescription medications.   Gabapentin                     PRESENT      EXPECTED   Sertraline                     PRESENT      EXPECTED   Desmethylsertraline            PRESENT      EXPECTED    Desmethylsertraline is an expected metabolite of sertraline.   Acetaminophen                  PRESENT      EXPECTED Drug Present not Declared for Prescription Verification   Trazodone                      PRESENT       UNEXPECTED   1,3 chlorophenyl piperazine    PRESENT      UNEXPECTED    1,3-chlorophenyl piperazine is an expected metabolite of    trazodone. Drug Absent but Declared for Prescription Verification   Oxycodone                      Not Detected UNEXPECTED ng/mg creat    Oxycodone is almost always present in patients taking this drug    consistently.  Absence of oxycodone could be due to lapse of time    since the last dose or unusual pharmacokinetics (rapid    metabolism).   Butalbital                     Not Detected UNEXPECTED   Tizanidine                     Not Detected UNEXPECTED    Tizanidine, as indicated in the declared medication list, is not    always detected even when used as directed. ==================================================================== Test                      Result    Flag   Units      Ref Range   Creatinine              84               mg/dL      >=20 ==================================================================== Declared Medications:  The flagging and interpretation on this report are based on the  following declared medications.  Unexpected results may arise from  inaccuracies in the declared medications.  **Note: The testing scope of this panel includes these medications:  Butalbital (Fioricet)  Gabapentin (Neurontin)  Lorazepam  Oxycodone (Oxycodone Acetaminophen)  Sertraline  **Note: The testing scope of this panel does not include small to  moderate amounts of these reported medications:  Acetaminophen (Fioricet)  Acetaminophen (Oxycodone Acetaminophen)  Tizanidine  **Note: The testing scope of this panel does not include following  reported medications:  Brexpiprazole (Rexulti)  Fluticasone (Flonase)  Levothyroxine  Metronidazole (Flagyl)  Omeprazole  Simvastatin ==================================================================== For clinical consultation, please call [phone]. ====================================================================    UDS interpretation: Unexpected findings not considered significantly abnormal The patient was given a final warning about the accuracy of reporting medications. Medication Assessment Form: Patient introduced to form today Treatment compliance: Treatment may start today if patient agrees with proposed plan. Evaluation of compliance is not applicable at this point Risk Assessment Profile: Aberrant behavior: See initial evaluations. None observed or detected today Comorbid factors increasing risk of overdose: See initial evaluation. No additional risks detected today Medical Psychology Evaluation: Please see scanned results in medical record.     Opioid Risk Tool - 09/19/16 0820      Family History of Substance Abuse   Alcohol Negative   Illegal Drugs Negative   Rx Drugs Negative     Personal History of Substance Abuse   Alcohol Negative   Illegal Drugs Negative   Rx Drugs Negative     Age   Age between 39-45 years  No     History of Preadolescent Sexual Abuse   History of Preadolescent Sexual Abuse Negative or Female     Psychological Disease   Psychological Disease Negative   Depression Positive  takes med     Total Score   Opioid Risk Tool Scoring 1   Opioid Risk Interpretation Low Risk     ORT Scoring interpretation table:  Score <3 = Low Risk for SUD  Score between 4-7 = Moderate Risk for SUD  Score >8 = High Risk for Opioid Abuse   Risk Mitigation Strategies:  Patient opioid safety counseling: Completed today. Counseling provided to patient as per "Patient Counseling Document". Document signed by patient, attesting to counseling and understanding Patient-Prescriber Agreement (PPA): Obtained today.  Controlled substance notification to other providers: Written and sent today.  Pharmacologic Plan: Today we may be taking over the patient's pharmacological regimen. See below              Laboratory Chemistry  Inflammation Markers (CRP: Acute Phase) (ESR: Chronic Phase) Lab Results  Component Value Date   CRP 0.7 08/14/2016   ESRSEDRATE 9 08/14/2016                 Renal Function Markers Lab Results  Component Value Date   BUN 17 08/14/2016   CREATININE 0.80 08/14/2016   GFRAA 93 08/14/2016   GFRNONAA 80 08/14/2016                 Hepatic Function Markers Lab Results  Component Value Date   AST 20 08/14/2016   ALT 10 08/14/2016   ALBUMIN 4.9 (H) 08/14/2016   ALKPHOS 103 08/14/2016                 Electrolytes Lab Results  Component Value Date   NA 139 08/14/2016   K 4.1 08/14/2016   CL 97 08/14/2016   CALCIUM 9.6 08/14/2016   MG 2.3 08/14/2016                 Neuropathy Markers Lab Results  Component Value Date   VITAMINB12 473 08/14/2016                 Bone Pathology Markers Lab Results  Component Value Date   ALKPHOS 103 08/14/2016   25OHVITD1 47 08/14/2016   25OHVITD2 <1.0 08/14/2016   25OHVITD3 47 08/14/2016   CALCIUM 9.6 08/14/2016  Coagulation Parameters No results found for: INR, LABPROT, APTT, PLT               Cardiovascular Markers No results found for: BNP, HGB, HCT               Note: Lab results reviewed and explained to patient in Layman's terms.  Recent Diagnostic Imaging Review  Cervical Imaging: Cervical DG complete:  Results for orders placed during the hospital encounter of 08/16/16  DG Cervical Spine Complete   Narrative CLINICAL DATA:  Chronic LEFT neck pain extending to LEFT shoulder, prior cervical spine surgery  EXAM: CERVICAL SPINE - COMPLETE 4+ VIEW  COMPARISON:  None  FINDINGS: Prevertebral soft tissues normal thickness.  Prior anterior fusion C4-C7.  Disc space narrowing with endplate spur formation at C3-C4.  Vertebral body and disc space heights otherwise maintained.  No acute fracture, subluxation, or bone destruction.  Bony foramina grossly patent.  C1-C2  alignment normal.  Tips of lung apices clear.  IMPRESSION: Prior C4-C7 anterior fusion.  Degenerative disc disease changes C3-C4.  No acute cervical spine abnormalities.   Electronically Signed   By: Lavonia Dana M.D.   On: 08/16/2016 13:02    Shoulder Imaging: Shoulder-L DG:  Results for orders placed during the hospital encounter of 08/16/16  DG Shoulder Left   Narrative CLINICAL DATA:  Left shoulder pain  EXAM: LEFT SHOULDER - 2+ VIEW  COMPARISON:  None.  FINDINGS: The left humeral head is in normal position and the glenohumeral joint space is unremarkable. The left AC joint appears normally aligned. No acute abnormality is seen .  IMPRESSION: Negative.   Electronically Signed   By: Ivar Drape M.D.   On: 08/16/2016 12:54    Sacroiliac Joint Imaging: Sacroiliac Joint DG:  Results for orders placed during the hospital encounter of 08/16/16  DG Si Joints   Narrative CLINICAL DATA:  SI joint pain, hip pain  EXAM: BILATERAL SACROILIAC JOINTS - 3+ VIEW  COMPARISON:  None.  FINDINGS: The SI joints are well corticated. There is no evidence of sacroiliitis. The sacral foramina appear corticated.  IMPRESSION: Negative.   Electronically Signed   By: Ivar Drape M.D.   On: 08/16/2016 12:54    Hip Imaging: Hip-R DG 2-3 views:  Results for orders placed during the hospital encounter of 08/16/16  DG HIP UNILAT W OR W/O PELVIS 2-3 VIEWS RIGHT   Narrative CLINICAL DATA:  Hip pain  EXAM: DG HIP (WITH OR WITHOUT PELVIS) 2-3V RIGHT  COMPARISON:  None.  FINDINGS: There is only mild degenerative joint disease of the right hip. No acute abnormality is seen. The right pelvic ramus is intact.  IMPRESSION: Mild degenerative change.  No acute abnormality.   Electronically Signed   By: Ivar Drape M.D.   On: 08/16/2016 12:55    Hip-L DG 2-3 views:  Results for orders placed during the hospital encounter of 08/16/16  DG HIP UNILAT W OR W/O PELVIS 2-3  VIEWS LEFT   Narrative CLINICAL DATA:  Pain, no recent injury  EXAM: DG HIP (WITH OR WITHOUT PELVIS) 2-3V LEFT  COMPARISON:  None.  FINDINGS: No acute fracture is seen. Very little degenerative joint disease of the hips is noted. The pelvic rami are intact. The SI joints are corticated. Hardware for fusion of the lower lumbar spine is present.  IMPRESSION: No acute abnormality. Only mild degenerative joint disease of the hips.   Electronically Signed   By: Ivar Drape M.D.   On:  08/16/2016 12:53    Knee Imaging: Knee-L DG 1-2 views:  Results for orders placed during the hospital encounter of 08/16/16  DG Knee 1-2 Views Left   Narrative CLINICAL DATA:  Pain, no injury  EXAM: LEFT KNEE - 1-2 VIEW  COMPARISON:  None.  FINDINGS: The left knee joint spaces are relatively well preserved for age. No fracture is seen. No joint effusion is noted.  IMPRESSION: Negative.   Electronically Signed   By: Ivar Drape M.D.   On: 08/16/2016 12:53    Note: Results of ordered imaging test(s) reviewed and explained to patient in Layman's terms. Copy of results provided to patient  Meds   Current Outpatient Prescriptions:  .  butalbital-acetaminophen-caffeine (FIORICET, ESGIC) 50-325-40 MG tablet, TAKE 1 TABLET BY MOUTH EVERY NIGHT AS NEEDED FOR HEADACHE, Disp: , Rfl: 2 .  fluticasone (FLONASE) 50 MCG/ACT nasal spray, 1 SPRAY IN EACH NOSTRIL ONCE A DAY NASALLY 30 DAYS, Disp: , Rfl: 3 .  gabapentin (NEURONTIN) 300 MG capsule, TAKE ONE CAPSULE BY MOUTH THREE times a day, Disp: , Rfl: 1 .  levothyroxine (SYNTHROID, LEVOTHROID) 50 MCG tablet, TAKE 1 TABLET ON AN EMPTY STOMACH IN THE MORNING ONCE A DAY ORALLY 90 DAYS, Disp: , Rfl: 3 .  LORazepam (ATIVAN) 0.5 MG tablet, Take 0.5 mg by mouth 2 (two) times daily as needed., Disp: , Rfl: 0 .  omeprazole (PRILOSEC) 40 MG capsule, Take 40 mg by mouth daily., Disp: , Rfl: 0 .  sertraline (ZOLOFT) 100 MG tablet, Take 200 mg by mouth 2 (two)  times daily. , Disp: , Rfl: 0 .  simvastatin (ZOCOR) 20 MG tablet, TAKE 1 TABLET IN THE EVENING ONCE A DAY ORALLY 90 DAYS, Disp: , Rfl: 0 .  tiZANidine (ZANAFLEX) 4 MG tablet, Take 1 tablet (4 mg total) by mouth 3 (three) times daily., Disp: 90 tablet, Rfl: 0 .  traMADol (ULTRAM) 50 MG tablet, Take 1 tablet (50 mg total) by mouth every 6 (six) hours as needed., Disp: 30 tablet, Rfl: 0 .  traZODone (DESYREL) 150 MG tablet, Take 150 mg by mouth at bedtime., Disp: , Rfl: 0 .  zolpidem (AMBIEN) 10 MG tablet, Take 10 mg by mouth at bedtime as needed., Disp: , Rfl: 0  ROS  Constitutional: Denies any fever or chills Gastrointestinal: No reported hemesis, hematochezia, vomiting, or acute GI distress Musculoskeletal: Denies any acute onset joint swelling, redness, loss of ROM, or weakness Neurological: No reported episodes of acute onset apraxia, aphasia, dysarthria, agnosia, amnesia, paralysis, loss of coordination, or loss of consciousness  Allergies  [name] is allergic to flagyl [metronidazole].  PFSH  Drug: [name]  has no drug history on file. Alcohol:  has no alcohol history on file. Tobacco:  reports that she has quit smoking. She quit after 4.00 years of use. She has never used smokeless tobacco. Medical:  has a past medical history of Allergy; Hyperlipidemia; and Thyroid disease. Surgical: [name]  has a past surgical history that includes Cesarean section; Abdominal hysterectomy; Breast reduction surgery; Cholecystectomy; Appendectomy; Gastric bypass (2005); spinal stimulator removed 2012 (2012); acrominoplasty (2015); mini tummy tuck; breast lift bilateral, implants; Lumbar laminectomy (2008); 5 miscarriages; and neck surgery C3-C7. Family: family history includes COPD in her mother; Cancer in her mother; Heart disease in her father.  Constitutional Exam  General appearance: Well nourished, well developed, and well hydrated. In no apparent acute distress Vitals:   09/19/16 0808   BP: 131/64  Pulse: 67  Resp: 16  Temp: 98.5 F (36.9 C)  SpO2: 99%  Weight: 184 lb (83.5 kg)  Height: _0  (1.575 m)   BMI Assessment: Estimated body mass index is 33.65 kg/m as calculated from the following:   Height as of this encounter: _1  (1.575 m).   Weight as of this encounter: 184 lb (83.5 kg).  BMI interpretation table: BMI level Category Range association with higher incidence of chronic pain  <18 kg/m2 Underweight   18.5-24.9 kg/m2 Ideal body weight   25-29.9 kg/m2 Overweight Increased incidence by 20%  30-34.9 kg/m2 Obese (Class I) Increased incidence by 68%  35-39.9 kg/m2 Severe obesity (Class II) Increased incidence by 136%  >40 kg/m2 Extreme obesity (Class III) Increased incidence by 254%   BMI Readings from Last 4 Encounters:  09/19/16 33.65 kg/m  08/14/16 33.65 kg/m   Wt Readings from Last 4 Encounters:  09/19/16 184 lb (83.5 kg)  08/14/16 184 lb (83.5 kg)  Psych/Mental status: Alert, oriented x 3 (person, place, & time)       Eyes: PERLA Respiratory: No evidence of acute respiratory distress  Cervical Spine Area Exam  Skin & Axial Inspection: Well healed scar from previous spine surgery detected Alignment: Symmetrical Functional ROM: Decreased ROM      Stability: No instability detected Muscle Tone/Strength: Functionally intact. No obvious neuro-muscular anomalies detected. Sensory (Neurological): Unimpaired Palpation: No palpable anomalies              Upper Extremity (UE) Exam    Side: Right upper extremity  Side: Left upper extremity  Skin & Extremity Inspection: Skin color, temperature, and hair growth are WNL. No peripheral edema or cyanosis. No masses, redness, swelling, asymmetry, or associated skin lesions. No contractures.  Skin & Extremity Inspection: Skin color, temperature, and hair growth are WNL. No peripheral edema or cyanosis. No masses, redness, swelling, asymmetry, or associated skin lesions. No contractures.  Functional ROM:  Unrestricted ROM          Functional ROM: Unrestricted ROM          Muscle Tone/Strength: Functionally intact. No obvious neuro-muscular anomalies detected.  Muscle Tone/Strength: Functionally intact. No obvious neuro-muscular anomalies detected.  Sensory (Neurological): Unimpaired          Sensory (Neurological): Unimpaired          Palpation: No palpable anomalies              Palpation: No palpable anomalies              Specialized Test(s): Deferred         Specialized Test(s): Deferred          Thoracic Spine Area Exam  Skin & Axial Inspection: No masses, redness, or swelling Alignment: Symmetrical Functional ROM: Unrestricted ROM Stability: No instability detected Muscle Tone/Strength: Functionally intact. No obvious neuro-muscular anomalies detected. Sensory (Neurological): Unimpaired Muscle strength & Tone: No palpable anomalies  Lumbar Spine Area Exam  Skin & Axial Inspection: Well healed scar from previous spine surgery detected Alignment: Symmetrical Functional ROM: Decreased ROM      Stability: No instability detected Muscle Tone/Strength: Functionally intact. No obvious neuro-muscular anomalies detected. Sensory (Neurological): Movement-associated pain Palpation: Complains of area being tender to palpation       Provocative Tests: Lumbar Hyperextension and rotation test: evaluation deferred today       Lumbar Lateral bending test: evaluation deferred today       Patrick's Maneuver: evaluation deferred today  Gait & Posture Assessment  Ambulation: Unassisted Gait: Relatively normal for age and body habitus Posture: WNL   Lower Extremity Exam    Side: Right lower extremity  Side: Left lower extremity  Skin & Extremity Inspection: Skin color, temperature, and hair growth are WNL. No peripheral edema or cyanosis. No masses, redness, swelling, asymmetry, or associated skin lesions. No contractures.  Skin & Extremity Inspection: Skin color, temperature, and  hair growth are WNL. No peripheral edema or cyanosis. No masses, redness, swelling, asymmetry, or associated skin lesions. No contractures.  Functional ROM: Unrestricted ROM          Functional ROM: Unrestricted ROM          Muscle Tone/Strength: Functionally intact. No obvious neuro-muscular anomalies detected.  Muscle Tone/Strength: Functionally intact. No obvious neuro-muscular anomalies detected.  Sensory (Neurological): Unimpaired  Sensory (Neurological): Unimpaired  Palpation: No palpable anomalies  Palpation: No palpable anomalies   Assessment & Plan  Primary Diagnosis & Pertinent Problem List: The primary encounter diagnosis was Chronic Low back pain (Primary Area of Pain) (Bilateral)  (R>L). Diagnoses of Grade 1 Anterolisthesis of L3 over L4, Chronic sacroiliac joint pain, Chronic hip pain (Secondary Area of Pain) (bilateral) (L>R), Chronic pain of lower extremity (Tertiary Area of Pain) (Bilateral) (L>R), Failed back surgical syndrome, Primary osteoarthritis of hips, bilateral, Chronic pain of left knee, Chronic neck pain , History of cervical fusion (ACDF C4-C7), DDD (degenerative disc disease), cervical, Chronic shoulder pain (Left), Chronic pain syndrome, Long term current use of opiate analgesic, Long term prescription benzodiazepine use, Long term prescription opiate use, and Opiate use were also pertinent to this visit.  Visit Diagnosis: 1. Chronic Low back pain (Primary Area of Pain) (Bilateral)  (R>L)   2. Grade 1 Anterolisthesis of L3 over L4   3. Chronic sacroiliac joint pain   4. Chronic hip pain (Secondary Area of Pain) (bilateral) (L>R)   5. Chronic pain of lower extremity (Tertiary Area of Pain) (Bilateral) (L>R)   6. Failed back surgical syndrome   7. Primary osteoarthritis of hips, bilateral   8. Chronic pain of left knee   9. Chronic neck pain    10. History of cervical fusion (ACDF C4-C7)   11. DDD (degenerative disc disease), cervical   12. Chronic shoulder pain  (Left)   13. Chronic pain syndrome   14. Long term current use of opiate analgesic   15. Long term prescription benzodiazepine use   16. Long term prescription opiate use   17. Opiate use    Problems updated and reviewed during this visit: Problem  Grade 1 Anterolisthesis of L3 over L4  Failed Back Surgical Syndrome   L4-5 Fusion   Chronic shoulder pain (Left)  History of cervical fusion (ACDF C4-C7)  Ddd (Degenerative Disc Disease), Cervical  Chronic Sacroiliac Joint Pain    Plan of Care  Pharmacotherapy (Medications Ordered): Meds ordered this encounter  Medications  . traMADol (ULTRAM) 50 MG tablet    Sig: Take 1 tablet (50 mg total) by mouth every 6 (six) hours as needed.    Dispense:  30 tablet    Refill:  0    Fill one day early if pharmacy is closed on scheduled refill date. Do not fill until: 09/19/16 To last until: 10/19/16  . tiZANidine (ZANAFLEX) 4 MG tablet    Sig: Take 1 tablet (4 mg total) by mouth 3 (three) times daily.    Dispense:  90 tablet    Refill:  0    Do  not place medication on "Automatic Refill". Fill one day early if pharmacy is closed on scheduled refill date.   Procedure Orders    No procedure(s) ordered today   Lab Orders  No laboratory test(s) ordered today    Imaging Orders     DG Lumbar Spine Complete W/Bend Referral Orders  No referral(s) requested today    Pharmacological management options:  Opioid Analgesics: We'll take over management today. See above orders Membrane stabilizer: We have discussed the possibility of optimizing this mode of therapy, if tolerated Muscle relaxant: We have discussed the possibility of a trial NSAID: We have discussed the possibility of a trial Other analgesic(s): To be determined at a later time   Interventional management options: Planned, scheduled, and/or pending:    Diagnostic x-rays of the lumbar spine on flexion and extension.   Considering:   Diagnostic bilateral lumbar epidural  steroid injection Possible bilateral lumbar facet block RFA Diagnostic left hip injection Diagnostic left femoral nerve + obturator nerve block  Diagnostic left knee intra-articular steroid injection Diagnostic left genicular nerve block Possible left knee RFA  Diagnostic cervical epidural steroid injection Diagnostic bilateral cervical facet block Possible bilateral cervical RFA   PRN Procedures:   To be determined at a later time   Provider-requested follow-up: Return in about 2 weeks (around 10/03/2016) for F/U eval with Dr. Dossie Arbour after test completion.  Future Appointments Date Time Provider [place]  10/15/2016 2:00 PM Milinda Pointer, MD [facility] None    Primary Care Physician: Cletis Athens, MD Location: [facility] Note by: Gaspar Cola, MD Date: 09/19/2016; Time: 9:37 AM

## 2016-09-19 ENCOUNTER — Ambulatory Visit
Admission: RE | Admit: 2016-09-19 | Discharge: 2016-09-19 | Disposition: A | Discharge status: Home / Self Care | Payer: Medicare HMO | Source: Ambulatory Visit | Attending: Pain Medicine | Admitting: Pain Medicine

## 2016-09-19 ENCOUNTER — Encounter: Payer: Self-pay | Admitting: Pain Medicine

## 2016-09-19 ENCOUNTER — Ambulatory Visit: Payer: Medicare HMO | Attending: Pain Medicine | Admitting: Pain Medicine

## 2016-09-19 ENCOUNTER — Encounter (INDEPENDENT_AMBULATORY_CARE_PROVIDER_SITE_OTHER): Payer: Self-pay

## 2016-09-19 VITALS — BP 131/64 | HR 67 | Temp 98.5°F | Resp 16 | Ht 62.0 in | Wt 184.0 lb

## 2016-09-19 DIAGNOSIS — M533 Sacrococcygeal disorders, not elsewhere classified: Secondary | ICD-10-CM | POA: Diagnosis not present

## 2016-09-19 DIAGNOSIS — M4310 Spondylolisthesis, site unspecified: Secondary | ICD-10-CM

## 2016-09-19 DIAGNOSIS — M5030 Other cervical disc degeneration, unspecified cervical region: Secondary | ICD-10-CM

## 2016-09-19 DIAGNOSIS — R69 Illness, unspecified: Secondary | ICD-10-CM | POA: Diagnosis not present

## 2016-09-19 DIAGNOSIS — G8929 Other chronic pain: Secondary | ICD-10-CM

## 2016-09-19 DIAGNOSIS — M5031 Other cervical disc degeneration,  high cervical region: Secondary | ICD-10-CM | POA: Insufficient documentation

## 2016-09-19 DIAGNOSIS — M79604 Pain in right leg: Secondary | ICD-10-CM

## 2016-09-19 DIAGNOSIS — M5441 Lumbago with sciatica, right side: Secondary | ICD-10-CM

## 2016-09-19 DIAGNOSIS — F1190 Opioid use, unspecified, uncomplicated: Secondary | ICD-10-CM

## 2016-09-19 DIAGNOSIS — M5442 Lumbago with sciatica, left side: Secondary | ICD-10-CM | POA: Insufficient documentation

## 2016-09-19 DIAGNOSIS — M25512 Pain in left shoulder: Secondary | ICD-10-CM | POA: Diagnosis not present

## 2016-09-19 DIAGNOSIS — Z79899 Other long term (current) drug therapy: Secondary | ICD-10-CM | POA: Diagnosis not present

## 2016-09-19 DIAGNOSIS — M542 Cervicalgia: Secondary | ICD-10-CM

## 2016-09-19 DIAGNOSIS — Z79891 Long term (current) use of opiate analgesic: Secondary | ICD-10-CM | POA: Diagnosis not present

## 2016-09-19 DIAGNOSIS — M545 Low back pain: Secondary | ICD-10-CM | POA: Diagnosis not present

## 2016-09-19 DIAGNOSIS — M25562 Pain in left knee: Secondary | ICD-10-CM

## 2016-09-19 DIAGNOSIS — M961 Postlaminectomy syndrome, not elsewhere classified: Secondary | ICD-10-CM | POA: Diagnosis not present

## 2016-09-19 DIAGNOSIS — Z981 Arthrodesis status: Secondary | ICD-10-CM | POA: Insufficient documentation

## 2016-09-19 DIAGNOSIS — G894 Chronic pain syndrome: Secondary | ICD-10-CM | POA: Insufficient documentation

## 2016-09-19 DIAGNOSIS — M160 Bilateral primary osteoarthritis of hip: Secondary | ICD-10-CM

## 2016-09-19 DIAGNOSIS — M25559 Pain in unspecified hip: Secondary | ICD-10-CM

## 2016-09-19 DIAGNOSIS — M4316 Spondylolisthesis, lumbar region: Secondary | ICD-10-CM | POA: Diagnosis not present

## 2016-09-19 DIAGNOSIS — M79605 Pain in left leg: Secondary | ICD-10-CM

## 2016-09-19 MED ORDER — TRAMADOL HCL 50 MG PO TABS: 50.0000 mg | ORAL_TABLET | Freq: Four times a day (QID) | ORAL | 0 refills | Status: DC | PRN

## 2016-09-19 NOTE — Patient Instructions (Addendum)
____________________________________________________________________________________________  Pain Scale  Introduction: The pain score used by this practice is the Verbal Numerical Rating Scale (VNRS-11). This is an 11-point scale. It is for adults and children 10 years or older. There are significant differences in how the pain score is reported, used, and applied. Forget everything you learned in the past and learn this scoring system.  General Information: The scale should reflect your current level of pain. Unless you are specifically asked for the level of your worst pain, or your average pain. If you are asked for one of these two, then it should be understood that it is over the past 24 hours.  Basic Activities of Daily Living (ADL): Personal hygiene, dressing, eating, transferring, and using restroom.  Instructions: Most patients tend to report their level of pain as a combination of two factors, their physical pain and their psychosocial pain. This last one is also known as "suffering" and it is reflection of how physical pain affects you socially and psychologically. From now on, report them separately. From this point on, when asked to report your pain level, report only your physical pain. Use the following table for reference.  Pain Clinic Pain Levels (0-5/10)  Pain Level Score  Description  No Pain 0   Mild pain 1 Nagging, annoying, but does not interfere with basic activities of daily living (ADL). Patients are able to eat, bathe, get dressed, toileting (being able to get on and off the toilet and perform personal hygiene functions), transfer (move in and out of bed or a chair without assistance), and maintain continence (able to control bladder and bowel functions). Blood pressure and heart rate are unaffected. A normal heart rate for a healthy adult ranges from 60 to 100 bpm (beats per minute).   Mild to moderate pain 2 Noticeable and distracting. Impossible to hide from other  people. More frequent flare-ups. Still possible to adapt and function close to normal. It can be very annoying and may have occasional stronger flare-ups. With discipline, patients may get used to it and adapt.   Moderate pain 3 Interferes significantly with activities of daily living (ADL). It becomes difficult to feed, bathe, get dressed, get on and off the toilet or to perform personal hygiene functions. Difficult to get in and out of bed or a chair without assistance. Very distracting. With effort, it can be ignored when deeply involved in activities.   Moderately severe pain 4 Impossible to ignore for more than a few minutes. With effort, patients may still be able to manage work or participate in some social activities. Very difficult to concentrate. Signs of autonomic nervous system discharge are evident: dilated pupils (mydriasis); mild sweating (diaphoresis); sleep interference. Heart rate becomes elevated (>115 bpm). Diastolic blood pressure (lower number) rises above 100 mmHg. Patients find relief in laying down and not moving.   Severe pain 5 Intense and extremely unpleasant. Associated with frowning face and frequent crying. Pain overwhelms the senses.  Ability to do any activity or maintain social relationships becomes significantly limited. Conversation becomes difficult. Pacing back and forth is common, as getting into a comfortable position is nearly impossible. Pain wakes you up from deep sleep. Physical signs will be obvious: pupillary dilation; increased sweating; goosebumps; brisk reflexes; cold, clammy hands and feet; nausea, vomiting or dry heaves; loss of appetite; significant sleep disturbance with inability to fall asleep or to remain asleep. When persistent, significant weight loss is observed due to the complete loss of appetite and sleep deprivation.  Blood   pressure and heart rate becomes significantly elevated. Caution: If elevated blood pressure triggers a pounding headache  associated with blurred vision, then the patient should immediately seek attention at an urgent or emergency care unit, as these may be signs of an impending stroke.    Emergency Department Pain Levels (6-10/10)  Emergency Room Pain 6 Severely limiting. Requires emergency care and should not be seen or managed at an outpatient pain management facility. Communication becomes difficult and requires great effort. Assistance to reach the emergency department may be required. Facial flushing and profuse sweating along with potentially dangerous increases in heart rate and blood pressure will be evident.   Distressing pain 7 Self-care is very difficult. Assistance is required to transport, or use restroom. Assistance to reach the emergency department will be required. Tasks requiring coordination, such as bathing and getting dressed become very difficult.   Disabling pain 8 Self-care is no longer possible. At this level, pain is disabling. The individual is unable to do even the most "basic" activities such as walking, eating, bathing, dressing, transferring to a bed, or toileting. Fine motor skills are lost. It is difficult to think clearly.   Incapacitating pain 9 Pain becomes incapacitating. Thought processing is no longer possible. Difficult to remember your own name. Control of movement and coordination are lost.   The worst pain imaginable 10 At this level, most patients pass out from pain. When this level is reached, collapse of the autonomic nervous system occurs, leading to a sudden drop in blood pressure and heart rate. This in turn results in a temporary and dramatic drop in blood flow to the brain, leading to a loss of consciousness. Fainting is one of the body's self defense mechanisms. Passing out puts the brain in a calmed state and causes it to shut down for a while, in order to begin the healing process.    Summary: 1. Refer to this scale when providing [place] with your pain level. 2. Be  accurate and careful when reporting your pain level. This will help with your care. 3. Over-reporting your pain level will lead to loss of credibility. 4. Even a level of 1/10 means that there is pain and will be treated at our facility. 5. High, inaccurate reporting will be documented as "Symptom Exaggeration", leading to loss of credibility and suspicions of possible secondary gains such as obtaining more narcotics, or wanting to appear disabled, for fraudulent reasons. 6. Only pain levels of 5 or below will be seen at our facility. 7. Pain levels of 6 and above will be sent to the Emergency Department and the appointment cancelled. ____________________________________________________________________________________________   ____________________________________________________________________________________________  Medication Rules  Applies to: All patients receiving prescriptions (written or electronic).  Pharmacy of record: Pharmacy where electronic prescriptions will be sent. If written prescriptions are taken to a different pharmacy, please inform the nursing staff. The pharmacy listed in the electronic medical record should be the one where you would like electronic prescriptions to be sent.  Prescription refills: Only during scheduled appointments. Applies to both, written and electronic prescriptions.  NOTE: The following applies primarily to controlled substances (Opioid* Pain Medications).   Patient's responsibilities: 1. Pain Pills: Bring all pain pills to every appointment (except for procedure appointments). 2. Pill Bottles: Bring pills in original pharmacy bottle. Always bring newest bottle. Bring bottle, even if empty. 3. Medication refills: You are responsible for knowing and keeping track of what medications you need refilled. The day before your appointment, write a list of all  prescriptions that need to be refilled. Bring that list to your appointment and give it to the  admitting nurse. Prescriptions will be written only during appointments. If you forget a medication, it will not be "Called in", "Faxed", or "electronically sent". You will need to get another appointment to get these prescribed. 4. Prescription Accuracy: You are responsible for carefully inspecting your prescriptions before leaving our office. Have the discharge nurse carefully go over each prescription with you, before taking them home. Make sure that your name is accurately spelled, that your address is correct. Check the name and dose of your medication to make sure it is accurate. Check the number of pills, and the written instructions to make sure they are clear and accurate. Make sure that you are given enough medication to last until your next medication refill appointment. 5. Taking Medication: Take medication as prescribed. Never take more pills than instructed. Never take medication more frequently than prescribed. Taking less pills or less frequently is permitted and encouraged, when it comes to controlled substances (written prescriptions).  6. Inform other Doctors: Always inform, all of your healthcare providers, of all the medications you take. 7. Pain Medication from other Providers: You are not allowed to accept any additional pain medication from any other Doctor or Healthcare provider. There are two exceptions to this rule. (see below) In the event that you require additional pain medication, you are responsible for notifying us, as stated below. 8. Medication Agreement: You are responsible for carefully reading and following our Medication Agreement. This must be signed before receiving any prescriptions from our practice. Safely store a copy of your signed Agreement. Violations to the Agreement will result in no further prescriptions. (Additional copies of our Medication Agreement are available upon request.) 9. Laws, Rules, & Regulations: All patients are expected to follow all Federal  and [employer], Rules, [employer]. Ignorance of the Laws does not constitute a valid excuse. The use of any illegal substances is prohibited. 10. Adopted CDC guidelines & recommendations: Target dosing levels will be at or below 60 MME/day. Use of benzodiazepines** is not recommended.  Exceptions: There are only two exceptions to the rule of not receiving pain medications from other Healthcare Providers. 1. Exception #1 (Emergencies): In the event of an emergency (i.e.: accident requiring emergency care), you are allowed to receive additional pain medication. However, you are responsible for: As soon as you are able, call our office [phone], at any time of the day or night, and leave a message stating your name, the date and nature of the emergency, and the name and dose of the medication prescribed. In the event that your call is answered by a member of our staff, make sure to document and save the date, time, and the name of the person that took your information.  2. Exception #2 (Planned Surgery): In the event that you are scheduled by another doctor or dentist to have any type of surgery or procedure, you are allowed (for a period no longer than 30 days), to receive additional pain medication, for the acute post-op pain. However, in this case, you are responsible for picking up a copy of our "Post-op Pain Management for Surgeons" handout, and giving it to your surgeon or dentist. This document is available at our office, and does not require an appointment to obtain it. Simply go to our office during business hours (Monday-Thursday from 8:00 AM to 4:00 PM) (Friday 8:00 AM to 12:00 Noon) or if you  have a scheduled appointment with [place], prior to your surgery, and ask for it by name. In addition, you will need to provide [place] with your name, name of your surgeon, type of surgery, and date of procedure or surgery.  *Opioid medications include: morphine, codeine, oxycodone, oxymorphone,  hydrocodone, hydromorphone, meperidine, tramadol, tapentadol, buprenorphine, fentanyl, methadone. **Benzodiazepine medications include: diazepam (Valium), alprazolam (Xanax), clonazepam (Klonopine), lorazepam (Ativan), clorazepate (Tranxene), chlordiazepoxide (Librium), estazolam (Prosom), oxazepam (Serax), temazepam (Restoril), triazolam (Halcion)  ____________________________________________________________________________________________ Pain Management Discharge Instructions  General Discharge Instructions :  If you need to reach your doctor call: Monday-Friday 8:00 am - 4:00 pm at [phone] or toll free [phone].  After clinic hours [phone] to have operator reach doctor.  Bring all of your medication bottles to all your appointments in the pain clinic.  To cancel or reschedule your appointment with Pain Management please remember to call 24 hours in advance to avoid a fee.  Refer to the educational materials which you have been given on: General Risks, I had my Procedure. Discharge Instructions, Post Sedation.  Post Procedure Instructions:  The drugs you were given will stay in your system until tomorrow, so for the next 24 hours you should not drive, make any legal decisions or drink any alcoholic beverages.  You may eat anything you prefer, but it is better to start with liquids then soups and crackers, and gradually work up to solid foods.  Please notify your doctor immediately if you have any unusual bleeding, trouble breathing or pain that is not related to your normal pain.  Depending on the type of procedure that was done, some parts of your body may feel week and/or numb.  This usually clears up by tonight or the next day.  Walk with the use of an assistive device or accompanied by an adult for the 24 hours.  You may use ice on the affected area for the first 24 hours.  Put ice in a Ziploc bag and cover with a towel and place against area 15 minutes on 15  minutes off.  You may switch to heat after 24 hours.Pain Management Discharge Instructions  General Discharge Instructions :  If you need to reach your doctor call: Monday-Friday 8:00 am - 4:00 pm at [phone] or toll free [phone].  After clinic hours [phone] to have operator reach doctor.  Bring all of your medication bottles to all your appointments in the pain clinic.  To cancel or reschedule your appointment with Pain Management please remember to call 24 hours in advance to avoid a fee.  Refer to the educational materials which you have been given on: General Risks, I had my Procedure. Discharge Instructions, Post Sedation.  Post Procedure Instructions:  The drugs you were given will stay in your system until tomorrow, so for the next 24 hours you should not drive, make any legal decisions or drink any alcoholic beverages.  You may eat anything you prefer, but it is better to start with liquids then soups and crackers, and gradually work up to solid foods.  Please notify your doctor immediately if you have any unusual bleeding, trouble breathing or pain that is not related to your normal pain.  Depending on the type of procedure that was done, some parts of your body may feel week and/or numb.  This usually clears up by tonight or the next day.  Walk with the use of an assistive device or accompanied by an adult for the 24 hours.  You may use ice  on the affected area for the first 24 hours.  Put ice in a Ziploc bag and cover with a towel and place against area 15 minutes on 15 minutes off.  You may switch to heat after 24 hours. 

## 2016-09-19 NOTE — Progress Notes (Signed)
Safety precautions to be maintained throughout the outpatient stay will include: orient to surroundings, keep bed in low position, maintain call bell within reach at all times, provide assistance with transfer out of bed and ambulation.  

## 2016-09-20 ENCOUNTER — Ambulatory Visit
Admission: RE | Admit: 2016-09-20 | Discharge: 2016-09-20 | Disposition: A | Discharge status: Home / Self Care | Payer: Medicare HMO | Source: Ambulatory Visit | Attending: Internal Medicine | Admitting: Internal Medicine

## 2016-09-23 ENCOUNTER — Other Ambulatory Visit: Payer: Self-pay | Admitting: Pain Medicine

## 2016-09-23 DIAGNOSIS — G894 Chronic pain syndrome: Secondary | ICD-10-CM

## 2016-09-26 ENCOUNTER — Telehealth: Payer: Self-pay | Admitting: Pain Medicine

## 2016-09-26 NOTE — Telephone Encounter (Signed)
Has enough Gabapentin, received Tramadol. States Dr. Dossie Arbour told her he would also write for Tizanidine, but he did not.Will ask Dr. Dossie Arbour about this.

## 2016-09-26 NOTE — Telephone Encounter (Signed)
Patient lvmail 09-25-16 at 4:33 stating she was understanding Dr. Dossie Arbour was going to write for her tizanidine, gabapentin, and tramadol. This has not been called in yet and she is out. Please let her know what she needs to do

## 2016-09-27 MED ORDER — TIZANIDINE HCL 4 MG PO TABS: 4.0000 mg | ORAL_TABLET | Freq: Three times a day (TID) | ORAL | 0 refills | Status: DC

## 2016-09-27 NOTE — Telephone Encounter (Signed)
Patient notified per voicemail that Tizanidine has been e-scribed to pharmacy. Instructed to be sure that all needed meds have been prescribed during that visit.

## 2016-09-28 DIAGNOSIS — M5030 Other cervical disc degeneration, unspecified cervical region: Secondary | ICD-10-CM | POA: Insufficient documentation

## 2016-09-28 DIAGNOSIS — G8929 Other chronic pain: Secondary | ICD-10-CM | POA: Insufficient documentation

## 2016-09-28 DIAGNOSIS — Z981 Arthrodesis status: Secondary | ICD-10-CM | POA: Insufficient documentation

## 2016-09-28 DIAGNOSIS — M533 Sacrococcygeal disorders, not elsewhere classified: Secondary | ICD-10-CM

## 2016-09-28 DIAGNOSIS — M25512 Pain in left shoulder: Secondary | ICD-10-CM

## 2016-09-28 DIAGNOSIS — M4310 Spondylolisthesis, site unspecified: Secondary | ICD-10-CM | POA: Insufficient documentation

## 2016-09-28 DIAGNOSIS — M961 Postlaminectomy syndrome, not elsewhere classified: Secondary | ICD-10-CM | POA: Insufficient documentation

## 2016-10-05 DIAGNOSIS — M489 Spondylopathy, unspecified: Secondary | ICD-10-CM | POA: Diagnosis not present

## 2016-10-05 DIAGNOSIS — E1169 Type 2 diabetes mellitus with other specified complication: Secondary | ICD-10-CM | POA: Diagnosis not present

## 2016-10-05 DIAGNOSIS — M5440 Lumbago with sciatica, unspecified side: Secondary | ICD-10-CM | POA: Diagnosis not present

## 2016-10-05 DIAGNOSIS — I498 Other specified cardiac arrhythmias: Secondary | ICD-10-CM | POA: Diagnosis not present

## 2016-10-12 DIAGNOSIS — R42 Dizziness and giddiness: Secondary | ICD-10-CM | POA: Diagnosis not present

## 2016-10-12 DIAGNOSIS — G894 Chronic pain syndrome: Secondary | ICD-10-CM | POA: Diagnosis not present

## 2016-10-12 DIAGNOSIS — G43909 Migraine, unspecified, not intractable, without status migrainosus: Secondary | ICD-10-CM | POA: Diagnosis not present

## 2016-10-12 DIAGNOSIS — I498 Other specified cardiac arrhythmias: Secondary | ICD-10-CM | POA: Diagnosis not present

## 2016-10-12 DIAGNOSIS — E785 Hyperlipidemia, unspecified: Secondary | ICD-10-CM | POA: Diagnosis not present

## 2016-10-15 ENCOUNTER — Ambulatory Visit: Payer: Medicare HMO | Attending: Pain Medicine | Admitting: Pain Medicine

## 2016-10-15 ENCOUNTER — Encounter: Payer: Self-pay | Admitting: Pain Medicine

## 2016-10-15 VITALS — BP 109/67 | HR 78 | Temp 98.9°F | Resp 18 | Ht 62.0 in | Wt 190.0 lb

## 2016-10-15 DIAGNOSIS — Z79899 Other long term (current) drug therapy: Secondary | ICD-10-CM | POA: Diagnosis not present

## 2016-10-15 DIAGNOSIS — Z79891 Long term (current) use of opiate analgesic: Secondary | ICD-10-CM | POA: Diagnosis not present

## 2016-10-15 DIAGNOSIS — M4310 Spondylolisthesis, site unspecified: Secondary | ICD-10-CM | POA: Diagnosis not present

## 2016-10-15 DIAGNOSIS — M5442 Lumbago with sciatica, left side: Secondary | ICD-10-CM

## 2016-10-15 DIAGNOSIS — Z981 Arthrodesis status: Secondary | ICD-10-CM | POA: Diagnosis not present

## 2016-10-15 DIAGNOSIS — M533 Sacrococcygeal disorders, not elsewhere classified: Secondary | ICD-10-CM | POA: Diagnosis not present

## 2016-10-15 DIAGNOSIS — M5441 Lumbago with sciatica, right side: Secondary | ICD-10-CM

## 2016-10-15 DIAGNOSIS — Z9884 Bariatric surgery status: Secondary | ICD-10-CM | POA: Diagnosis not present

## 2016-10-15 DIAGNOSIS — Z836 Family history of other diseases of the respiratory system: Secondary | ICD-10-CM | POA: Insufficient documentation

## 2016-10-15 DIAGNOSIS — Z881 Allergy status to other antibiotic agents status: Secondary | ICD-10-CM | POA: Insufficient documentation

## 2016-10-15 DIAGNOSIS — M47816 Spondylosis without myelopathy or radiculopathy, lumbar region: Secondary | ICD-10-CM | POA: Insufficient documentation

## 2016-10-15 DIAGNOSIS — Z9889 Other specified postprocedural states: Secondary | ICD-10-CM | POA: Diagnosis not present

## 2016-10-15 DIAGNOSIS — Z809 Family history of malignant neoplasm, unspecified: Secondary | ICD-10-CM | POA: Insufficient documentation

## 2016-10-15 DIAGNOSIS — Z9049 Acquired absence of other specified parts of digestive tract: Secondary | ICD-10-CM | POA: Insufficient documentation

## 2016-10-15 DIAGNOSIS — Z90710 Acquired absence of both cervix and uterus: Secondary | ICD-10-CM | POA: Diagnosis not present

## 2016-10-15 DIAGNOSIS — G8929 Other chronic pain: Secondary | ICD-10-CM | POA: Diagnosis not present

## 2016-10-15 DIAGNOSIS — E785 Hyperlipidemia, unspecified: Secondary | ICD-10-CM | POA: Diagnosis not present

## 2016-10-15 DIAGNOSIS — Z8249 Family history of ischemic heart disease and other diseases of the circulatory system: Secondary | ICD-10-CM | POA: Insufficient documentation

## 2016-10-15 DIAGNOSIS — E079 Disorder of thyroid, unspecified: Secondary | ICD-10-CM | POA: Diagnosis not present

## 2016-10-15 DIAGNOSIS — Z87891 Personal history of nicotine dependence: Secondary | ICD-10-CM | POA: Diagnosis not present

## 2016-10-15 DIAGNOSIS — G894 Chronic pain syndrome: Secondary | ICD-10-CM | POA: Insufficient documentation

## 2016-10-15 MED ORDER — TRAMADOL HCL 50 MG PO TABS: 50.0000 mg | ORAL_TABLET | Freq: Four times a day (QID) | ORAL | 0 refills | Status: DC | PRN

## 2016-10-15 MED ORDER — TIZANIDINE HCL 4 MG PO TABS: 4.0000 mg | ORAL_TABLET | Freq: Three times a day (TID) | ORAL | 0 refills | Status: DC

## 2016-10-15 NOTE — Progress Notes (Signed)
Patient's Name: Ashley Fields  MRN: [id number]  Referring Provider: Masoud, Javed, MD  DOB: 03/13/1955  PCP: Masoud, Javed, MD  DOS: 10/15/2016  Note by:  A , MD  Service setting: Ambulatory outpatient  Specialty: Interventional Pain Management  Location: [facility] (AMB) Pain Management Facility    Patient type: Established   Primary Reason(s) for Visit: Encounter for prescription drug management. (Level of risk: moderate)  CC: Back Pain (low left is worse than right) and Knee Pain (left anterior is numb, makes left leg weak)  HPI  Ms. Fields is a 61 y.o. year old, female patient, who comes today for a medication management evaluation. She has Chronic pain syndrome; Chronic neck pain ; Chronic hip pain (Secondary Area of Pain) (Bilateral) (L>R); Long term current use of opiate analgesic; Long term prescription opiate use; Opiate use; Chronic knee pain (Left); Osteoarthritis of hip (Bilateral); Chronic Low back pain (Primary Area of Pain) (Bilateral)  (R>L); Chronic pain of lower extremity (Tertiary Area of Pain) (Bilateral) (L>R); Long term prescription benzodiazepine use; Grade 1 Anterolisthesis of L3 over L4; Failed back surgical syndrome; Chronic shoulder pain (Left); History of cervical fusion (ACDF C4-C7); DDD (degenerative disc disease), cervical; 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 Her primarily concern today is the Back Pain (low left is worse than right) and Knee Pain (left anterior is numb, makes left leg weak)  Pain Assessment: Location: Lower Back Radiating: left leg and knee Onset: More than a month ago Duration: Chronic pain Quality: Aching, Constant, Radiating (left hip has a pulling sensation) Severity: 4 /10 (self-reported pain score)  Note: Reported level is compatible with observation.                   When using our objective Pain Scale, levels between 6 and 10/10 are said to belong in an emergency room, as  it progressively worsens from a 6/10, described as severely limiting, requiring emergency care not usually available at an outpatient pain management facility. At a 6/10 level, communication becomes difficult and requires great effort. Assistance to reach the emergency department may be required. Facial flushing and profuse sweating along with potentially dangerous increases in heart rate and blood pressure will be evident. Timing: Constant Modifying factors: medications  Ms. Fields was last scheduled for an appointment on 09/26/2016 for medication management. During today's appointment we reviewed Ms. Fields's chronic pain status, as well as her outpatient medication regimen.  The patient  has no drug history on file. Her body mass index is 34.75 kg/m.  Further details on both, my assessment(s), as well as the proposed treatment plan, please see below.  Controlled Substance Pharmacotherapy Assessment REMS (Risk Evaluation and Mitigation Strategy)  Analgesic: Tramadol 50 mg, 1 tab PO q6hrs (200 mg/day) MME/day: 20 mg/day.  Shatley, Jennifer C, RN  10/15/2016  1:48 PM  Sign at close encounter Nursing Pain Medication Assessment:  Safety precautions to be maintained throughout the outpatient stay will include: orient to surroundings, keep bed in low position, maintain call bell within reach at all times, provide assistance with transfer out of bed and ambulation.  Medication Inspection Compliance: Ms. Fields did not comply with our request to bring her pills to be counted. She was reminded that bringing the medication bottles, even when empty, is a requirement.  Medication: None brought in. Pill/Patch Count: None available to be counted. Bottle Appearance: No container available. Did not bring bottle(s) to appointment. Filled Date: N/A Last   Medication intake:  Today   Pharmacokinetics: Liberation and absorption (onset of action): WNL Distribution (time to peak effect): WNL Metabolism and  excretion (duration of action): WNL         Pharmacodynamics: Desired effects: Analgesia: Ms. Fields reports >50% benefit. Functional ability: Patient reports that medication allows her to accomplish basic ADLs Clinically meaningful improvement in function (CMIF): Sustained CMIF goals met Perceived effectiveness: Described as relatively effective, allowing for increase in activities of daily living (ADL) Undesirable effects: Side-effects or Adverse reactions: None reported Monitoring: [place] PMP: Online review of the past 12-month period conducted. Compliant with practice rules and regulations Last UDS on record: Summary  Date Value Ref Range Status  08/14/2016 FINAL  Final    Comment:    ==================================================================== [facility] COMP DRUG ANALYSIS,UR ==================================================================== Test                             Result       Flag       Units Drug Present and Declared for Prescription Verification   Lorazepam                      2290         EXPECTED   ng/mg creat    Source of lorazepam is a scheduled prescription medication.   Oxymorphone                    683          EXPECTED   ng/mg creat   Noroxycodone                   3024         EXPECTED   ng/mg creat   Noroxymorphone                 1675         EXPECTED   ng/mg creat    Oxymorphone, noroxycodone and noroxymorphone are expected    metabolites of oxycodone. Noroxymorphone is an expected    metabolite of oxymorphone. Sources of oxycodone and/or    oxymorphone include scheduled prescription medications.   Gabapentin                     PRESENT      EXPECTED   Sertraline                     PRESENT      EXPECTED   Desmethylsertraline            PRESENT      EXPECTED    Desmethylsertraline is an expected metabolite of sertraline.   Acetaminophen                  PRESENT      EXPECTED Drug Present not Declared for Prescription Verification   Trazodone                       PRESENT      UNEXPECTED   1,3 chlorophenyl piperazine    PRESENT      UNEXPECTED    1,3-chlorophenyl piperazine is an expected metabolite of    trazodone. Drug Absent but Declared for Prescription Verification   Oxycodone                      Not Detected UNEXPECTED ng/mg creat      Oxycodone is almost always present in patients taking this drug    consistently.  Absence of oxycodone could be due to lapse of time    since the last dose or unusual pharmacokinetics (rapid    metabolism).   Butalbital                     Not Detected UNEXPECTED   Tizanidine                     Not Detected UNEXPECTED    Tizanidine, as indicated in the declared medication list, is not    always detected even when used as directed. ==================================================================== Test                      Result    Flag   Units      Ref Range   Creatinine              84               mg/dL      >=20 ==================================================================== Declared Medications:  The flagging and interpretation on this report are based on the  following declared medications.  Unexpected results may arise from  inaccuracies in the declared medications.  **Note: The testing scope of this panel includes these medications:  Butalbital (Fioricet)  Gabapentin (Neurontin)  Lorazepam  Oxycodone (Oxycodone Acetaminophen)  Sertraline  **Note: The testing scope of this panel does not include small to  moderate amounts of these reported medications:  Acetaminophen (Fioricet)  Acetaminophen (Oxycodone Acetaminophen)  Tizanidine  **Note: The testing scope of this panel does not include following  reported medications:  Brexpiprazole (Rexulti)  Fluticasone (Flonase)  Levothyroxine  Metronidazole (Flagyl)  Omeprazole  Simvastatin ==================================================================== For clinical consultation, please call [phone]. ====================================================================    UDS interpretation: Compliant          Medication Assessment Form: Reviewed. Patient indicates being compliant with therapy Treatment compliance: Compliant Risk Assessment Profile: Aberrant behavior: See prior evaluations. None observed or detected today Comorbid factors increasing risk of overdose: See prior notes. No additional risks detected today Risk of substance use disorder (SUD): Low     Opioid Risk Tool - 09/19/16 0820      Family History of Substance Abuse   Alcohol Negative   Illegal Drugs Negative   Rx Drugs Negative     Personal History of Substance Abuse   Alcohol Negative   Illegal Drugs Negative   Rx Drugs Negative     Age   Age between 68-45 years  No     History of Preadolescent Sexual Abuse   History of Preadolescent Sexual Abuse Negative or Female     Psychological Disease   Psychological Disease Negative   Depression Positive  takes med     Total Score   Opioid Risk Tool Scoring 1   Opioid Risk Interpretation Low Risk     ORT Scoring interpretation table:  Score <3 = Low Risk for SUD  Score between 4-7 = Moderate Risk for SUD  Score >8 = High Risk for Opioid Abuse   Risk Mitigation Strategies:  Patient Counseling: Covered Patient-Prescriber Agreement (PPA): Present and active  Notification to other healthcare providers: Done  Pharmacologic Plan: No change in therapy, at this time  Laboratory Chemistry  Inflammation Markers (CRP: Acute Phase) (ESR: Chronic Phase) Lab Results  Component Value Date   CRP 0.7 08/14/2016   ESRSEDRATE 9 08/14/2016  Renal Function Markers Lab Results  Component Value Date   BUN 17 08/14/2016   CREATININE 0.80 08/14/2016   GFRAA 93 08/14/2016   GFRNONAA 80 08/14/2016                 Hepatic Function Markers Lab Results  Component Value Date   AST 20 08/14/2016   ALT 10 08/14/2016   ALBUMIN 4.9 (H)  08/14/2016   ALKPHOS 103 08/14/2016                 Electrolytes Lab Results  Component Value Date   NA 139 08/14/2016   K 4.1 08/14/2016   CL 97 08/14/2016   CALCIUM 9.6 08/14/2016   MG 2.3 08/14/2016                 Neuropathy Markers Lab Results  Component Value Date   VITAMINB12 473 08/14/2016                 Bone Pathology Markers Lab Results  Component Value Date   ALKPHOS 103 08/14/2016   25OHVITD1 47 08/14/2016   25OHVITD2 <1.0 08/14/2016   25OHVITD3 47 08/14/2016   CALCIUM 9.6 08/14/2016                 Coagulation Parameters No results found for: INR, LABPROT, APTT, PLT               Cardiovascular Markers No results found for: BNP, HGB, HCT               Note: Lab results reviewed.  Recent Diagnostic Imaging Results  DG Lumbar Spine Complete W/Bend CLINICAL DATA:  Chronic bilateral lower back pain.  EXAM: LUMBAR SPINE - COMPLETE WITH BENDING VIEWS  COMPARISON:  None.  FINDINGS: Status post surgical posterior fusion of L4-5 with bilateral intrapedicular screw placement. Grade 1 anterolisthesis of L3-4 is noted, most likely due to posterior facet joint hypertrophy. No change in vertebral body alignment is noted on flexion or extension views. Moderate degenerative disc disease is noted at L3-4. Mild degenerative disc disease is noted at L1-2. IVC filter is noted.  IMPRESSION: Grade 1 anterolisthesis of L3-4 is noted which is unchanged on flexion or extension views. Status post surgical posterior fusion of L4-5. No fracture is noted.  Electronically Signed   By: James  Green Jr, M.D.   On: 09/19/2016 13:19  Complexity Note: Imaging results reviewed. Results shared with Ms. Fields, using Layman's terms.                         Meds   Current Outpatient Prescriptions:  .  Biotin 10000 MCG TABS, Take 10,000 mcg by mouth 1 day or 1 dose., Disp: , Rfl:  .  Bromelains 500 MG TABS, Take 2 tablets by mouth 1 day or 1 dose., Disp: , Rfl:  .   butalbital-acetaminophen-caffeine (FIORICET, ESGIC) 50-325-40 MG tablet, TAKE 1 TABLET BY MOUTH EVERY NIGHT AS NEEDED FOR HEADACHE, Disp: , Rfl: 2 .  ferrous fumarate-b12-vitamic C-folic acid (TRINSICON / FOLTRIN) capsule, Take 1 capsule by mouth 3 (three) times daily after meals., Disp: , Rfl:  .  fluticasone (FLONASE) 50 MCG/ACT nasal spray, 1 SPRAY IN EACH NOSTRIL ONCE A DAY NASALLY 30 DAYS, Disp: , Rfl: 3 .  gabapentin (NEURONTIN) 300 MG capsule, TAKE ONE CAPSULE BY MOUTH THREE times a day, Disp: , Rfl: 1 .  LORazepam (ATIVAN) 0.5 MG tablet, Take 0.5 mg by mouth 2 (two) times   daily as needed., Disp: , Rfl: 0 .  Melatonin 10 MG CAPS, Take 1 capsule by mouth at bedtime., Disp: , Rfl:  .  Multiple Vitamin (MULTIVITAMIN WITH MINERALS) TABS tablet, Take 1 tablet by mouth daily., Disp: , Rfl:  .  omeprazole (PRILOSEC) 40 MG capsule, Take 40 mg by mouth daily., Disp: , Rfl: 0 .  sertraline (ZOLOFT) 100 MG tablet, Take 200 mg by mouth 2 (two) times daily. , Disp: , Rfl: 0 .  simvastatin (ZOCOR) 20 MG tablet, TAKE 1 TABLET IN THE EVENING ONCE A DAY ORALLY 90 DAYS, Disp: , Rfl: 0 .  [START ON 10/17/2016] tiZANidine (ZANAFLEX) 4 MG tablet, Take 1 tablet (4 mg total) by mouth 3 (three) times daily., Disp: 90 tablet, Rfl: 0 .  [START ON 10/17/2016] traMADol (ULTRAM) 50 MG tablet, Take 1 tablet (50 mg total) by mouth every 6 (six) hours as needed for severe pain., Disp: 120 tablet, Rfl: 0 .  traZODone (DESYREL) 150 MG tablet, Take 150 mg by mouth at bedtime., Disp: , Rfl: 0 .  Turmeric 500 MG CAPS, Take 2 capsules by mouth daily., Disp: , Rfl:   ROS  Constitutional: Denies any fever or chills Gastrointestinal: No reported hemesis, hematochezia, vomiting, or acute GI distress Musculoskeletal: Denies any acute onset joint swelling, redness, loss of ROM, or weakness Neurological: No reported episodes of acute onset apraxia, aphasia, dysarthria, agnosia, amnesia, paralysis, loss of coordination, or loss of  consciousness  Allergies  Ms. Fields is allergic to flagyl [metronidazole].  PFSH  Drug: [name]  has no drug history on file. Alcohol:  has no alcohol history on file. Tobacco:  reports that she has quit smoking. She quit after 4.00 years of use. She has never used smokeless tobacco. Medical:  has a past medical history of Allergy; Hyperlipidemia; and Thyroid disease. Surgical: [name]  has a past surgical history that includes Cesarean section; Abdominal hysterectomy; Breast reduction surgery; Cholecystectomy; Appendectomy; Gastric bypass (2005); spinal stimulator removed 2012 (2012); acrominoplasty (2015); mini tummy tuck; breast lift bilateral, implants; Lumbar laminectomy (2008); 5 miscarriages; and neck surgery C3-C7. Family: family history includes COPD in her mother; Cancer in her mother; Heart disease in her father.  Constitutional Exam  General appearance: Well nourished, well developed, and well hydrated. In no apparent acute distress Vitals:   10/15/16 1333  BP: 109/67  Pulse: 78  Resp: 18  Temp: 98.9 F (37.2 C)  SpO2: 100%  Weight: 190 lb (86.2 kg)  Height: 5' 2" (1.575 m)   BMI Assessment: Estimated body mass index is 34.75 kg/m as calculated from the following:   Height as of this encounter: 5' 2" (1.575 m).   Weight as of this encounter: 190 lb (86.2 kg).  BMI interpretation table: BMI level Category Range association with higher incidence of chronic pain  <18 kg/m2 Underweight   18.5-24.9 kg/m2 Ideal body weight   25-29.9 kg/m2 Overweight Increased incidence by 20%  30-34.9 kg/m2 Obese (Class I) Increased incidence by 68%  35-39.9 kg/m2 Severe obesity (Class II) Increased incidence by 136%  >40 kg/m2 Extreme obesity (Class III) Increased incidence by 254%   BMI Readings from Last 4 Encounters:  10/15/16 34.75 kg/m  09/19/16 33.65 kg/m  08/14/16 33.65 kg/m   Wt Readings from Last 4 Encounters:  10/15/16 190 lb (86.2 kg)  09/19/16 184 lb (83.5  kg)  08/14/16 184 lb (83.5 kg)  Psych/Mental status: Alert, oriented x 3 (person, place, & time)  Eyes: PERLA Respiratory: No evidence of acute respiratory distress  Cervical Spine Area Exam  Skin & Axial Inspection: No masses, redness, edema, swelling, or associated skin lesions Alignment: Symmetrical Functional ROM: Unrestricted ROM      Stability: No instability detected Muscle Tone/Strength: Functionally intact. No obvious neuro-muscular anomalies detected. Sensory (Neurological): Unimpaired Palpation: No palpable anomalies              Upper Extremity (UE) Exam    Side: Right upper extremity  Side: Left upper extremity  Skin & Extremity Inspection: Skin color, temperature, and hair growth are WNL. No peripheral edema or cyanosis. No masses, redness, swelling, asymmetry, or associated skin lesions. No contractures.  Skin & Extremity Inspection: Skin color, temperature, and hair growth are WNL. No peripheral edema or cyanosis. No masses, redness, swelling, asymmetry, or associated skin lesions. No contractures.  Functional ROM: Unrestricted ROM          Functional ROM: Unrestricted ROM          Muscle Tone/Strength: Functionally intact. No obvious neuro-muscular anomalies detected.  Muscle Tone/Strength: Functionally intact. No obvious neuro-muscular anomalies detected.  Sensory (Neurological): Unimpaired          Sensory (Neurological): Unimpaired          Palpation: No palpable anomalies              Palpation: No palpable anomalies              Specialized Test(s): Deferred         Specialized Test(s): Deferred          Thoracic Spine Area Exam  Skin & Axial Inspection: No masses, redness, or swelling Alignment: Symmetrical Functional ROM: Unrestricted ROM Stability: No instability detected Muscle Tone/Strength: Functionally intact. No obvious neuro-muscular anomalies detected. Sensory (Neurological): Unimpaired Muscle strength & Tone: No palpable anomalies  Lumbar Spine  Area Exam  Skin & Axial Inspection: No masses, redness, or swelling Alignment: Symmetrical Functional ROM: Limited ROM      Stability: No instability detected Muscle Tone/Strength: Functionally intact. No obvious neuro-muscular anomalies detected. Sensory (Neurological): Movement-associated pain Palpation: Complains of area being tender to palpation       Provocative Tests: Lumbar Hyperextension and rotation test: Positive bilaterally for facet joint pain. (R>L) Lumbar Lateral bending test: evaluation deferred today       Patrick's Maneuver: Positive for bilateral S-I arthralgia (R>L) and for right hip arthralgia  Gait & Posture Assessment  Ambulation: Patient ambulates using a cane Gait: Very limited, using assistive device to ambulate Posture: Antalgic   Lower Extremity Exam    Side: Right lower extremity  Side: Left lower extremity  Skin & Extremity Inspection: Skin color, temperature, and hair growth are WNL. No peripheral edema or cyanosis. No masses, redness, swelling, asymmetry, or associated skin lesions. No contractures.  Skin & Extremity Inspection: Skin color, temperature, and hair growth are WNL. No peripheral edema or cyanosis. No masses, redness, swelling, asymmetry, or associated skin lesions. No contractures.  Functional ROM: Unrestricted ROM          Functional ROM: Unrestricted ROM          Muscle Tone/Strength: Functionally intact. No obvious neuro-muscular anomalies detected.  Muscle Tone/Strength: Functionally intact. No obvious neuro-muscular anomalies detected.  Sensory (Neurological): Unimpaired  Sensory (Neurological): Unimpaired  Palpation: No palpable anomalies  Palpation: No palpable anomalies   Assessment  Primary Diagnosis & Pertinent Problem List: The primary encounter diagnosis was Chronic Low back pain (Primary Area of Pain) (Bilateral)  (  R>L). 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  Status Diagnosis  Controlled Controlled Controlled 1. Chronic Low back pain (Primary Area of Pain) (Bilateral)  (R>L)   2. 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 Chronic sacroiliac joint pain   4. Grade 1 Anterolisthesis of L3 over L4   5. Chronic pain syndrome   6. 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 tiZANidine (ZANAFLEX) 4 MG tablet    Sig: Take 1 tablet (4 mg total) by mouth 3 (three) times daily.    Dispense:  90 tablet    Refill:  0    Do not place medication on "Automatic Refill". Fill one day early if pharmacy is closed on scheduled refill date.  . traMADol (ULTRAM) 50 MG tablet    Sig: Take 1 tablet (50 mg total) by mouth every 6 (six) hours as needed for severe pain.    Dispense:  120 tablet    Refill:  0    Fill one day early if pharmacy is closed on scheduled refill date. Do not fill until: 10/17/16 To last until: 11/16/16   New Prescriptions   No medications on file   Medications administered today: [name] had no medications administered during this visit.   Procedure Orders     LUMBAR FACET(MEDIAL BRANCH NERVE BLOCK) MBNB     SACROILIAC JOINT INJECTINS Lab Orders  No laboratory test(s) ordered today   Imaging Orders  No imaging studies ordered today    Referral Orders     Ambulatory referral to Psychiatry  Interventional management options: Planned, scheduled, and/or pending:   Diagnostic bilateral lumbar facet + SI block under fluoro and IV sedation.  This will be followed by diagnostic/therapeutic left L3-4 TFESI under fluoro and IV sedation.   Considering:   Diagnosticbilateral lumbar epidural  steroid injection Possible bilateral lumbar facet block RFA Diagnosticleft hip injection Diagnosticleft femoral nerve + obturator nerve block  Diagnostic left knee intra-articular steroid injection Diagnosticleft genicular nerve block Possible left knee RFA  Diagnostic cervical epidural steroid injection Diagnosticbilateral cervical facet block Possiblebilateral cervical RFA   Palliative PRN treatment(s):   None at this time   Provider-requested follow-up: Return for Procedure (w/ sedation): (B) L-FCT + SI Blk.  Future Appointments Date Time Provider [place]  11/15/2016 9:00 AM Vevelyn Francois, NP [facility] None   Primary Care Physician: Cletis Athens, MD Location: [facility] Note by: Gaspar Cola, MD Date: 10/15/2016; Time: 2:56 PM

## 2016-10-15 NOTE — Patient Instructions (Addendum)
____________________________________________________________________________________________  Preparing for Procedure with Sedation Instructions: . Oral Intake: Do not eat or drink anything for at least 8 hours prior to your procedure. . Transportation: Public transportation is not allowed. Bring an adult driver. The driver must be physically present in our waiting room before any procedure can be started. Marland Kitchen Physical Assistance: Bring an adult physically capable of assisting you, in the event you need help. This adult should keep you company at home for at least 6 hours after the procedure. . Blood Pressure Medicine: Take your blood pressure medicine with a sip of water the morning of the procedure. . Blood thinners:  . Diabetics on insulin: Notify the staff so that you can be scheduled 1st case in the morning. If your diabetes requires high dose insulin, take only  of your normal insulin dose the morning of the procedure and notify the staff that you have done so. . Preventing infections: Shower with an antibacterial soap the morning of your procedure. . Build-up your immune system: Take 1000 mg of Vitamin C with every meal (3 times a day) the day prior to your procedure. Marland Kitchen Antibiotics: Inform the staff if you have a condition or reason that requires you to take antibiotics before dental procedures. . Pregnancy: If you are pregnant, call and cancel the procedure. . Sickness: If you have a cold, fever, or any active infections, call and cancel the procedure. . Arrival: You must be in the facility at least 30 minutes prior to your scheduled procedure. . Children: Do not bring children with you. . Dress appropriately: Bring dark clothing that you would not mind if they get stained. . Valuables: Do not bring any jewelry or valuables. Procedure appointments are reserved for interventional treatments only. Marland Kitchen No Prescription Refills. . No medication changes will be discussed during procedure  appointments. . No disability issues will be discussed. ____________________________________________________________________________________________   ____________________________________________________________________________________________  Medication Rules  Applies to: All patients receiving prescriptions (written or electronic).  Pharmacy of record: Pharmacy where electronic prescriptions will be sent. If written prescriptions are taken to a different pharmacy, please inform the nursing staff. The pharmacy listed in the electronic medical record should be the one where you would like electronic prescriptions to be sent.  Prescription refills: Only during scheduled appointments. Applies to both, written and electronic prescriptions.  NOTE: The following applies primarily to controlled substances (Opioid* Pain Medications).   Patient's responsibilities: 1. Pain Pills: Bring all pain pills to every appointment (except for procedure appointments). 2. Pill Bottles: Bring pills in original pharmacy bottle. Always bring newest bottle. Bring bottle, even if empty. 3. Medication refills: You are responsible for knowing and keeping track of what medications you need refilled. The day before your appointment, write a list of all prescriptions that need to be refilled. Bring that list to your appointment and give it to the admitting nurse. Prescriptions will be written only during appointments. If you forget a medication, it will not be "Called in", "Faxed", or "electronically sent". You will need to get another appointment to get these prescribed. 4. Prescription Accuracy: You are responsible for carefully inspecting your prescriptions before leaving our office. Have the discharge nurse carefully go over each prescription with you, before taking them home. Make sure that your name is accurately spelled, that your address is correct. Check the name and dose of your medication to make sure it is  accurate. Check the number of pills, and the written instructions to make sure they are clear and accurate.  Make sure that you are given enough medication to last until your next medication refill appointment. 5. Taking Medication: Take medication as prescribed. Never take more pills than instructed. Never take medication more frequently than prescribed. Taking less pills or less frequently is permitted and encouraged, when it comes to controlled substances (written prescriptions).  6. Inform other Doctors: Always inform, all of your healthcare providers, of all the medications you take. 7. Pain Medication from other Providers: You are not allowed to accept any additional pain medication from any other Doctor or Healthcare provider. There are two exceptions to this rule. (see below) In the event that you require additional pain medication, you are responsible for notifying us, as stated below. 8. Medication Agreement: You are responsible for carefully reading and following our Medication Agreement. This must be signed before receiving any prescriptions from our practice. Safely store a copy of your signed Agreement. Violations to the Agreement will result in no further prescriptions. (Additional copies of our Medication Agreement are available upon request.) 9. Laws, Rules, & Regulations: All patients are expected to follow all Federal and [employer], [employer], Rules, [facility]. Ignorance of the Laws does not constitute a valid excuse. The use of any illegal substances is prohibited. 10. Adopted CDC guidelines & recommendations: Target dosing levels will be at or below 60 MME/day. Use of benzodiazepines** is not recommended.  Exceptions: There are only two exceptions to the rule of not receiving pain medications from other Healthcare Providers. 1. Exception #1 (Emergencies): In the event of an emergency (i.e.: accident requiring emergency care), you are allowed to receive additional pain medication.  However, you are responsible for: As soon as you are able, call our office [phone], at any time of the day or night, and leave a message stating your name, the date and nature of the emergency, and the name and dose of the medication prescribed. In the event that your call is answered by a member of our staff, make sure to document and save the date, time, and the name of the person that took your information.  2. Exception #2 (Planned Surgery): In the event that you are scheduled by another doctor or dentist to have any type of surgery or procedure, you are allowed (for a period no longer than 30 days), to receive additional pain medication, for the acute post-op pain. However, in this case, you are responsible for picking up a copy of our "Post-op Pain Management for Surgeons" handout, and giving it to your surgeon or dentist. This document is available at our office, and does not require an appointment to obtain it. Simply go to our office during business hours (Monday-Thursday from 8:00 AM to 4:00 PM) (Friday 8:00 AM to 12:00 Noon) or if you have a scheduled appointment with [place], prior to your surgery, and ask for it by name. In addition, you will need to provide [place] with your name, name of your surgeon, type of surgery, and date of procedure or surgery.  *Opioid medications include: morphine, codeine, oxycodone, oxymorphone, hydrocodone, hydromorphone, meperidine, tramadol, tapentadol, buprenorphine, fentanyl, methadone. **Benzodiazepine medications include: diazepam (Valium), alprazolam (Xanax), clonazepam (Klonopine), lorazepam (Ativan), clorazepate (Tranxene), chlordiazepoxide (Librium), estazolam (Prosom), oxazepam (Serax), temazepam (Restoril), triazolam (Halcion)  ____________________________________________________________________________________________ Sacroiliac (SI) Joint Injection Patient Information  Description: The sacroiliac joint connects the scrum (very low back and tailbone) to  the ilium (a pelvic bone which also forms half of the hip joint).  Normally this joint experiences very little motion.  When this joint  becomes inflamed or unstable low back and or hip and pelvis pain may result.  Injection of this joint with local anesthetics (numbing medicines) and steroids can provide diagnostic information and reduce pain.  This injection is performed with the aid of x-ray guidance into the tailbone area while you are lying on your stomach.   You may experience an electrical sensation down the leg while this is being done.  You may also experience numbness.  We also may ask if we are reproducing your normal pain during the injection.  Conditions which may be treated SI injection:   Low back, buttock, hip or leg pain  Preparation for the Injection:  3. Do not eat any solid food or dairy products within 8 hours of your appointment.  4. You may drink clear liquids up to 3 hours before appointment.  Clear liquids include water, black coffee, juice or soda.  No milk or cream please. 5. You may take your regular medications, including pain medications with a sip of water before your appointment.  Diabetics should hold regular insulin (if take separately) and take 1/2 normal NPH dose the morning of the procedure.  Carry some sugar containing items with you to your appointment. 6. A driver must accompany you and be prepared to drive you home after your procedure. 7. Bring all of your current medications with you. 8. An IV may be inserted and sedation may be given at the discretion of the physician. 9. A blood pressure cuff, EKG and other monitors will often be applied during the procedure.  Some patients may need to have extra oxygen administered for a short period.  10. You will be asked to provide medical information, including your allergies, prior to the procedure.  We must know immediately if you are taking blood thinners (like Coumadin/Warfarin) or if you are allergic to IV iodine  contrast (dye).  We must know if you could possible be pregnant.  Possible side effects:   Bleeding from needle site  Infection (rare, may require surgery)  Nerve injury (rare)  Numbness & tingling (temporary)  A brief convulsion or seizure  Light-headedness (temporary)  Pain at injection site (several days)  Decreased blood pressure (temporary)  Weakness in the leg (temporary)   Call if you experience:   New onset weakness or numbness of an extremity below the injection site that last more than 8 hours.  Hives or difficulty breathing ( go to the emergency room)  Inflammation or drainage at the injection site  Any new symptoms which are concerning to you  Please note:  Although the local anesthetic injected can often make your back/ hip/ buttock/ leg feel good for several hours after the injections, the pain will likely return.  It takes 3-7 days for steroids to work in the sacroiliac area.  You may not notice any pain relief for at least that one week.  If effective, we will often do a series of three injections spaced 3-6 weeks apart to maximally decrease your pain.  After the initial series, we generally will wait some months before a repeat injection of the same type.  If you have any questions, please call [phone] [place]  What are the risk, side effects and possible complications? Generally speaking, most procedures are safe.  However, with any procedure there are risks, side effects, and the possibility of complications.  The risks and complications are dependent upon the sites that are lesioned,  or the type of nerve block to be performed.  The closer the procedure is to the spine, the more serious the risks are.  Great care is taken when placing the radio frequency needles, block needles or lesioning probes, but sometimes complications can occur. 1. Infection: Any time there is an  injection through the skin, there is a risk of infection.  This is why sterile conditions are used for these blocks.  There are four possible types of infection. 1. Localized skin infection. 2. Central Nervous System Infection-This can be in the form of Meningitis, which can be deadly. 3. Epidural Infections-This can be in the form of an epidural abscess, which can cause pressure inside of the spine, causing compression of the spinal cord with subsequent paralysis. This would require an emergency surgery to decompress, and there are no guarantees that the patient would recover from the paralysis. 4. Discitis-This is an infection of the intervertebral discs.  It occurs in about 1% of discography procedures.  It is difficult to treat and it may lead to surgery.        2. Pain: the needles have to go through skin and soft tissues, will cause soreness.       3. Damage to internal structures:  The nerves to be lesioned may be near blood vessels or    other nerves which can be potentially damaged.       4. Bleeding: Bleeding is more common if the patient is taking blood thinners such as  aspirin, Coumadin, Ticiid, Plavix, etc., or if he/she have some genetic predisposition  such as hemophilia. Bleeding into the spinal canal can cause compression of the spinal  cord with subsequent paralysis.  This would require an emergency surgery to  decompress and there are no guarantees that the patient would recover from the  paralysis.       5. Pneumothorax:  Puncturing of a lung is a possibility, every time a needle is introduced in  the area of the chest or upper back.  Pneumothorax refers to free air around the  collapsed lung(s), inside of the thoracic cavity (chest cavity).  Another two possible  complications related to a similar event would include: Hemothorax and Chylothorax.   These are variations of the Pneumothorax, where instead of air around the collapsed  lung(s), you may have blood or chyle, respectively.        6. Spinal headaches: They may occur with any procedures in the area of the spine.       7. Persistent CSF (Cerebro-Spinal Fluid) leakage: This is a rare problem, but may occur  with prolonged intrathecal or epidural catheters either due to the formation of a fistulous  track or a dural tear.       8. Nerve damage: By working so close to the spinal cord, there is always a possibility of  nerve damage, which could be as serious as a permanent spinal cord injury with  paralysis.       9. Death:  Although rare, severe deadly allergic reactions known as "Anaphylactic  reaction" can occur to any of the medications used.      10. Worsening of the symptoms:  We can always make thing worse.  What are the chances of something like this happening? Chances of any of this occuring are extremely low.  By statistics, you have more of a chance of getting killed in a motor vehicle accident: while driving to the hospital than any of the above occurring .  Nevertheless, you should be aware that they are possibilities.  In general, it is similar to taking a shower.  Everybody knows that you can slip, hit your head and get killed.  Does that mean that you should not shower again?  Nevertheless always keep in mind that statistics do not mean anything if you happen to be on the wrong side of them.  Even if a procedure has a 1 (one) in a 1,000,000 (million) chance of going wrong, it you happen to be that one..Also, keep in mind that by statistics, you have more of a chance of having something go wrong when taking medications.  Who should not have this procedure? If you are on a blood thinning medication (e.g. Coumadin, Plavix, see list of "Blood Thinners"), or if you have an active infection going on, you should not have the procedure.  If you are taking any blood thinners, please inform your physician.  How should I prepare for this procedure?  Do not eat or drink anything at least six hours prior to the  procedure.  Bring a driver with you .  It cannot be a taxi.  Come accompanied by an adult that can drive you back, and that is strong enough to help you if your legs get weak or numb from the local anesthetic.  Take all of your medicines the morning of the procedure with just enough water to swallow them.  If you have diabetes, make sure that you are scheduled to have your procedure done first thing in the morning, whenever possible.  If you have diabetes, take only half of your insulin dose and notify our nurse that you have done so as soon as you arrive at the clinic.  If you are diabetic, but only take blood sugar pills (oral hypoglycemic), then do not take them on the morning of your procedure.  You may take them after you have had the procedure.  Do not take aspirin or any aspirin-containing medications, at least eleven (11) days prior to the procedure.  They may prolong bleeding.  Wear loose fitting clothing that may be easy to take off and that you would not mind if it got stained with Betadine or blood.  Do not wear any jewelry or perfume  Remove any nail coloring.  It will interfere with some of our monitoring equipment.  NOTE: Remember that this is not meant to be interpreted as a complete list of all possible complications.  Unforeseen problems may occur.  BLOOD THINNERS The following drugs contain aspirin or other products, which can cause increased bleeding during surgery and should not be taken for 2 weeks prior to and 1 week after surgery.  If you should need take something for relief of minor pain, you may take acetaminophen which is found in Tylenol,m Datril, Anacin-3 and Panadol. It is not blood thinner. The products listed below are.  Do not take any of the products listed below in addition to any listed on your instruction sheet.  A.P.C or A.P.C with Codeine Codeine Phosphate Capsules #3 Ibuprofen Ridaura  ABC compound Congesprin Imuran rimadil  Advil Cope Indocin  Robaxisal  Alka-Seltzer Effervescent Pain Reliever and Antacid Coricidin or Coricidin-D  Indomethacin Rufen  Alka-Seltzer plus Cold Medicine Cosprin Ketoprofen S-A-C Tablets  Anacin Analgesic Tablets or Capsules Coumadin Korlgesic Salflex  Anacin Extra Strength Analgesic tablets or capsules CP-2 Tablets Lanoril Salicylate  Anaprox Cuprimine Capsules Levenox Salocol  Anexsia-D Dalteparin Magan Salsalate  Anodynos Darvon compound Magnesium Salicylate Sine-off  Ansaid Dasin Capsules Magsal Sodium Salicylate  Anturane Depen Capsules Marnal Soma  APF Arthritis pain formula Dewitt's Pills Measurin Stanback  Argesic Dia-Gesic Meclofenamic Sulfinpyrazone  Arthritis Bayer Timed Release Aspirin Diclofenac Meclomen Sulindac  Arthritis pain formula Anacin Dicumarol Medipren Supac  Analgesic (Safety coated) Arthralgen Diffunasal Mefanamic Suprofen  Arthritis Strength Bufferin Dihydrocodeine Mepro Compound Suprol  Arthropan liquid Dopirydamole Methcarbomol with Aspirin Synalgos  ASA tablets/Enseals Disalcid Micrainin Tagament  Ascriptin Doan's Midol Talwin  Ascriptin A/D Dolene Mobidin Tanderil  Ascriptin Extra Strength Dolobid Moblgesic Ticlid  Ascriptin with Codeine Doloprin or Doloprin with Codeine Momentum Tolectin  Asperbuf Duoprin Mono-gesic Trendar  Aspergum Duradyne Motrin or Motrin IB Triminicin  Aspirin plain, buffered or enteric coated Durasal Myochrisine Trigesic  Aspirin Suppositories Easprin Nalfon Trillsate  Aspirin with Codeine Ecotrin Regular or Extra Strength Naprosyn Uracel  Atromid-S Efficin Naproxen Ursinus  Auranofin Capsules Elmiron Neocylate Vanquish  Axotal Emagrin Norgesic Verin  Azathioprine Empirin or Empirin with Codeine Normiflo Vitamin E  Azolid Emprazil Nuprin Voltaren  Bayer Aspirin plain, buffered or children's or timed BC Tablets or powders Encaprin Orgaran Warfarin Sodium  Buff-a-Comp Enoxaparin Orudis Zorpin  Buff-a-Comp with Codeine Equegesic Os-Cal-Gesic    Buffaprin Excedrin plain, buffered or Extra Strength Oxalid   Bufferin Arthritis Strength Feldene Oxphenbutazone   Bufferin plain or Extra Strength Feldene Capsules Oxycodone with Aspirin   Bufferin with Codeine Fenoprofen Fenoprofen Pabalate or Pabalate-SF   Buffets II Flogesic Panagesic   Buffinol plain or Extra Strength Florinal or Florinal with Codeine Panwarfarin   Buf-Tabs Flurbiprofen Penicillamine   Butalbital Compound Four-way cold tablets Penicillin   Butazolidin Fragmin Pepto-Bismol   Carbenicillin Geminisyn Percodan   Carna Arthritis Reliever Geopen Persantine   Carprofen Gold's salt Persistin   Chloramphenicol Goody's Phenylbutazone   Chloromycetin Haltrain Piroxlcam   Clmetidine heparin Plaquenil   Cllnoril Hyco-pap Ponstel   Clofibrate Hydroxy chloroquine Propoxyphen         Before stopping any of these medications, be sure to consult the physician who ordered them.  Some, such as Coumadin (Warfarin) are ordered to prevent or treat serious conditions such as "deep thrombosis", "pumonary embolisms", and other heart problems.  The amount of time that you may need off of the medication may also vary with the medication and the reason for which you were taking it.  If you are taking any of these medications, please make sure you notify your pain physician before you undergo any procedures.          Facet Joint Block The facet joints connect the bones of the spine (vertebrae). They make it possible for you to bend, twist, and make other movements with your spine. They also keep you from bending too far, twisting too far, and making other excessive movements. A facet joint block is a procedure where a numbing medicine (anesthetic) is injected into a facet joint. Often, a type of anti-inflammatory medicine called a steroid is also injected. A facet joint block may be done to diagnose neck or back pain. If the pain gets better after a facet joint block, it means the pain is  probably coming from the facet joint. If the pain does not get better, it means the pain is probably not coming from the facet joint. A facet joint block may also be done to relieve neck or back pain caused by an inflamed facet joint. A facet joint block is only done to relieve pain if the pain does not improve with other methods, such as medicine, exercise  programs, and physical therapy. Tell a health care provider about:  Any allergies you have.  All medicines you are taking, including vitamins, herbs, eye drops, creams, and over-the-counter medicines.  Any problems you or family members have had with anesthetic medicines.  Any blood disorders you have.  Any surgeries you have had.  Any medical conditions you have.  Whether you are pregnant or may be pregnant. What are the risks? Generally, this is a safe procedure. However, problems may occur, including:  Bleeding.  Injury to a nerve near the injection site.  Pain at the injection site.  Weakness or numbness in areas controlled by nerves near the injection site.  Infection.  Temporary fluid retention.  Allergic reactions to medicines or dyes.  Injury to other structures or organs near the injection site.  What happens before the procedure?  Follow instructions from your health care provider about eating or drinking restrictions.  Ask your health care provider about: ? Changing or stopping your regular medicines. This is especially important if you are taking diabetes medicines or blood thinners. ? Taking medicines such as aspirin and ibuprofen. These medicines can thin your blood. Do not take these medicines before your procedure if your health care provider instructs you not to.  Do not take any new dietary supplements or medicines without asking your health care provider first.  Plan to have someone take you home after the procedure. What happens during the procedure?  You may need to remove your clothing and  dress in an open-back gown.  The procedure will be done while you are lying on an X-ray table. You will most likely be asked to lie on your stomach, but you may be asked to lie in a different position if an injection will be made in your neck.  Machines will be used to monitor your oxygen levels, heart rate, and blood pressure.  If an injection will be made in your neck, an IV tube will be inserted into one of your veins. Fluids and medicine will flow directly into your body through the IV tube.  The area over the facet joint where the injection will be made will be cleaned with soap. The surrounding skin will be covered with clean drapes.  A numbing medicine (local anesthetic) will be applied to your skin. Your skin may sting or burn for a moment.  A video X-ray machine (fluoroscopy) will be used to locate the joint. In some cases, a CT scan may be used.  A contrast dye may be injected into the facet joint area to help locate the joint.  When the joint is located, an anesthetic will be injected into the joint through the needle.  Your health care provider will ask you whether you feel pain relief. If you do feel relief, a steroid may be injected to provide pain relief for a longer period of time. If you do not feel relief or feel only partial relief, additional injections of an anesthetic may be made in other facet joints.  The needle will be removed.  Your skin will be cleaned.  A bandage (dressing) will be applied over each injection site. The procedure may vary among health care providers and hospitals. What happens after the procedure?  You will be observed for 15-30 minutes before being allowed to go home. This information is not intended to replace advice given to you by your health care provider. Make sure you discuss any questions you have with your health care provider. Document Released:  05/16/2006 Document Revised: 01/26/2015 Document Reviewed: 09/20/2014 Elsevier  Interactive Patient Education  [employer].

## 2016-10-15 NOTE — Progress Notes (Signed)
Nursing Pain Medication Assessment:  Safety precautions to be maintained throughout the outpatient stay will include: orient to surroundings, keep bed in low position, maintain call bell within reach at all times, provide assistance with transfer out of bed and ambulation.  Medication Inspection Compliance: [name] did not comply with our request to bring her pills to be counted. She was reminded that bringing the medication bottles, even when empty, is a requirement.  Medication: None brought in. Pill/Patch Count: None available to be counted. Bottle Appearance: No container available. Did not bring bottle(s) to appointment. Filled Date: N/A Last Medication intake:  Today

## 2016-10-16 DIAGNOSIS — B1921 Unspecified viral hepatitis C with hepatic coma: Secondary | ICD-10-CM | POA: Diagnosis not present

## 2016-10-25 ENCOUNTER — Encounter: Payer: Self-pay | Admitting: Pain Medicine

## 2016-10-25 ENCOUNTER — Ambulatory Visit (HOSPITAL_BASED_OUTPATIENT_CLINIC_OR_DEPARTMENT_OTHER): Payer: Medicare HMO | Admitting: Pain Medicine

## 2016-10-25 ENCOUNTER — Ambulatory Visit
Admission: RE | Admit: 2016-10-25 | Discharge: 2016-10-25 | Disposition: A | Discharge status: Home / Self Care | Payer: Medicare HMO | Source: Ambulatory Visit | Attending: Pain Medicine | Admitting: Pain Medicine

## 2016-10-25 VITALS — BP 153/72 | HR 68 | Temp 97.5°F | Resp 16 | Ht 62.0 in | Wt 189.0 lb

## 2016-10-25 DIAGNOSIS — M47896 Other spondylosis, lumbar region: Secondary | ICD-10-CM | POA: Insufficient documentation

## 2016-10-25 DIAGNOSIS — M533 Sacrococcygeal disorders, not elsewhere classified: Secondary | ICD-10-CM | POA: Diagnosis not present

## 2016-10-25 DIAGNOSIS — G8929 Other chronic pain: Secondary | ICD-10-CM | POA: Diagnosis not present

## 2016-10-25 DIAGNOSIS — M5441 Lumbago with sciatica, right side: Secondary | ICD-10-CM | POA: Insufficient documentation

## 2016-10-25 DIAGNOSIS — M5136 Other intervertebral disc degeneration, lumbar region: Secondary | ICD-10-CM | POA: Insufficient documentation

## 2016-10-25 DIAGNOSIS — M47816 Spondylosis without myelopathy or radiculopathy, lumbar region: Secondary | ICD-10-CM | POA: Diagnosis not present

## 2016-10-25 DIAGNOSIS — M5442 Lumbago with sciatica, left side: Secondary | ICD-10-CM | POA: Diagnosis not present

## 2016-10-25 DIAGNOSIS — M4310 Spondylolisthesis, site unspecified: Secondary | ICD-10-CM

## 2016-10-25 MED ORDER — ROPIVACAINE HCL 2 MG/ML IJ SOLN
9.0000 mL | Freq: Once | INTRAMUSCULAR | Status: AC
Start: 2016-10-25 — End: 2016-10-25
  Administered 2016-10-25: 9 mL via INTRA_ARTICULAR
  Filled 2016-10-25: qty 10

## 2016-10-25 MED ORDER — METHYLPREDNISOLONE ACETATE 80 MG/ML IJ SUSP
80.0000 mg | Freq: Once | INTRAMUSCULAR | Status: AC
  Administered 2016-10-25: 80 mg via INTRA_ARTICULAR
  Filled 2016-10-25: qty 1

## 2016-10-25 MED ORDER — ROPIVACAINE HCL 2 MG/ML IJ SOLN
9.0000 mL | Freq: Once | INTRAMUSCULAR | Status: AC
  Administered 2016-10-25: 9 mL via PERINEURAL
  Filled 2016-10-25: qty 10

## 2016-10-25 MED ORDER — LACTATED RINGERS IV SOLN
1000.0000 mL | Freq: Once | INTRAVENOUS | Status: AC
  Administered 2016-10-25: 1000 mL via INTRAVENOUS

## 2016-10-25 MED ORDER — MIDAZOLAM HCL 5 MG/5ML IJ SOLN
1.0000 mg | INTRAMUSCULAR | Status: DC | PRN
  Administered 2016-10-25: 3 mg via INTRAVENOUS
  Filled 2016-10-25: qty 5

## 2016-10-25 MED ORDER — FENTANYL CITRATE (PF) 100 MCG/2ML IJ SOLN
25.0000 ug | INTRAMUSCULAR | Status: DC | PRN
  Administered 2016-10-25: 100 ug via INTRAVENOUS
  Filled 2016-10-25: qty 2

## 2016-10-25 MED ORDER — LIDOCAINE HCL 2 % IJ SOLN
10.0000 mL | Freq: Once | INTRAMUSCULAR | Status: AC
  Administered 2016-10-25: 200 mg
  Filled 2016-10-25: qty 100

## 2016-10-25 MED ORDER — TRIAMCINOLONE ACETONIDE 40 MG/ML IJ SUSP
40.0000 mg | Freq: Once | INTRAMUSCULAR | Status: AC
Start: 2016-10-25 — End: 2016-10-25
  Administered 2016-10-25: 40 mg
  Filled 2016-10-25: qty 1

## 2016-10-25 MED ORDER — TRIAMCINOLONE ACETONIDE 40 MG/ML IJ SUSP
40.0000 mg | Freq: Once | INTRAMUSCULAR | Status: AC
  Administered 2016-10-25: 40 mg
  Filled 2016-10-25: qty 1

## 2016-10-25 NOTE — Progress Notes (Signed)
Safety precautions to be maintained throughout the outpatient stay will include: orient to surroundings, keep bed in low position, maintain call bell within reach at all times, provide assistance with transfer out of bed and ambulation.  

## 2016-10-25 NOTE — Progress Notes (Signed)
Patient's Name: Ashley Fields  MRN: [id number]  Referring Provider: Milinda Pointer, MD  DOB: [birth year]  PCP: Cletis Athens, MD  DOS: 10/25/2016  Note by: Gaspar Cola, MD  Service setting: Ambulatory outpatient  Specialty: Interventional Pain Management  Patient type: Established  Location: [facility] (AMB) Pain Management Facility  Visit type: Interventional Procedure   Primary Reason for Visit: Interventional Pain Management Treatment. CC: Back Pain (lower and left is worse)  Procedure:  Anesthesia, Analgesia, Anxiolysis:  Procedure #1: Type: Diagnostic Medial Branch Facet Block #1 Region: Lumbar Level: L2, L3, L4, L5, & S1 Medial Branch Level(s) Laterality: Bilateral  Procedure #2: Type: Diagnostic Sacroiliac Joint Block #1 Region: Posterior Lumbosacral Level: PSIS (Posterior Superior Iliac Spine) Sacroiliac Joint Laterality: Bilateral  Type: Local Anesthesia with Moderate (Conscious) Sedation Local Anesthetic: Lidocaine 1% Route: Intravenous (IV) IV Access: Secured Sedation: Meaningful verbal contact was maintained at all times during the procedure  Indication(s): Analgesia and Anxiety   Indications: 1. Lumbar Facet Syndrome (Bilateral) (R>L)   2. Lumbar facet osteoarthritis (Bilateral)   3. Chronic Low back pain (Primary Area of Pain) (Bilateral)  (R>L)   4. Chronic sacroiliac joint pain (Bilateral) (R>L)   5. Grade 1 Anterolisthesis of L3 over L4   6. Lumbar Facet Syndrome (B) (R>L)    Pain Score: Pre-procedure: 3 /10 Post-procedure: 0-No pain (3 in the left leg, 0 in th back)/10  Pre-op Assessment:  Ashley Fields is a 61 y.o. (year old), female patient, seen today for interventional treatment. She  has a past surgical history that includes Cesarean section; Abdominal hysterectomy; Breast reduction surgery; Cholecystectomy; Appendectomy; Gastric bypass (2005); spinal stimulator removed 2012 (2012); acrominoplasty (2015); mini tummy tuck; breast lift bilateral, implants;  Lumbar laminectomy (2008); 5 miscarriages; and neck surgery C3-C7. Ashley Fields has a current medication list which includes the following prescription(s): biotin, bromelains, butalbital-acetaminophen-caffeine, ferrous [id number] c-folic acid, fluticasone, gabapentin, lorazepam, melatonin, multivitamin with minerals, omeprazole, sertraline, simvastatin, tizanidine, tramadol, trazodone, and turmeric, and the following Facility-Administered Medications: fentanyl and midazolam. Her primarily concern today is the Back Pain (lower and left is worse)  Initial Vital Signs: There were no vitals taken for this visit. BMI: Estimated body mass index is 34.57 kg/m as calculated from the following:   Height as of this encounter: 5\' 2"  (1.575 m).   Weight as of this encounter: 189 lb (85.7 kg).  Risk Assessment: Allergies: Reviewed. She is allergic to flagyl [metronidazole].  Allergy Precautions: None required Coagulopathies: Reviewed. None identified.  Blood-thinner therapy: None at this time Active Infection(s): Reviewed. None identified. Ashley Fields is afebrile  Site Confirmation: Ashley Fields was asked to confirm the procedure and laterality before marking the site Procedure checklist: Completed Consent: Before the procedure and under the influence of no sedative(s), amnesic(s), or anxiolytics, the patient was informed of the treatment options, risks and possible complications. To fulfill our ethical and legal obligations, as recommended by the American Medical Association's Code of Ethics, I have informed the patient of my clinical impression; the nature and purpose of the treatment or procedure; the risks, benefits, and possible complications of the intervention; the alternatives, including doing nothing; the risk(s) and benefit(s) of the alternative treatment(s) or procedure(s); and the risk(s) and benefit(s) of doing nothing. The patient was provided information about the general risks and possible  complications associated with the procedure. These may include, but are not limited to: failure to achieve desired goals, infection, bleeding, organ or nerve damage, allergic reactions, paralysis, and death. In addition, the patient was  informed of those risks and complications associated to Spine-related procedures, such as failure to decrease pain; infection (i.e.: Meningitis, epidural or intraspinal abscess); bleeding (i.e.: epidural hematoma, subarachnoid hemorrhage, or any other type of intraspinal or peri-dural bleeding); organ or nerve damage (i.e.: Any type of peripheral nerve, nerve root, or spinal cord injury) with subsequent damage to sensory, motor, and/or autonomic systems, resulting in permanent pain, numbness, and/or weakness of one or several areas of the body; allergic reactions; (i.e.: anaphylactic reaction); and/or death. Furthermore, the patient was informed of those risks and complications associated with the medications. These include, but are not limited to: allergic reactions (i.e.: anaphylactic or anaphylactoid reaction(s)); adrenal axis suppression; blood sugar elevation that in diabetics may result in ketoacidosis or comma; water retention that in patients with history of congestive heart failure may result in shortness of breath, pulmonary edema, and decompensation with resultant heart failure; weight gain; swelling or edema; medication-induced neural toxicity; particulate matter embolism and blood vessel occlusion with resultant organ, and/or nervous system infarction; and/or aseptic necrosis of one or more joints. Finally, the patient was informed that Medicine is not an exact science; therefore, there is also the possibility of unforeseen or unpredictable risks and/or possible complications that may result in a catastrophic outcome. The patient indicated having understood very clearly. We have given the patient no guarantees and we have made no promises. Enough time was given to the  patient to ask questions, all of which were answered to the patient's satisfaction. Ashley Fields has indicated that she wanted to continue with the procedure. Attestation: I, the ordering provider, attest that I have discussed with the patient the benefits, risks, side-effects, alternatives, likelihood of achieving goals, and potential problems during recovery for the procedure that I have provided informed consent. Date: 10/25/2016; Time: 7:21 AM  Pre-Procedure Preparation:  Monitoring: As per clinic protocol. Respiration, ETCO2, SpO2, BP, heart rate and rhythm monitor placed and checked for adequate function Safety Precautions: Patient was assessed for positional comfort and pressure points before starting the procedure. Time-out: I initiated and conducted the "Time-out" before starting the procedure, as per protocol. The patient was asked to participate by confirming the accuracy of the "Time Out" information. Verification of the correct person, site, and procedure were performed and confirmed by me, the nursing staff, and the patient. "Time-out" conducted as per Joint Commission's Universal Protocol (UP.01.01.01). "Time-out" Date & Time: 10/25/2016; 0854 hrs.  Description of Procedure #1 Process:   Time-out: "Time-out" completed before starting procedure, as per protocol. Position: Prone Target Area: For Lumbar Facet blocks, the target is the groove formed by the junction of the transverse process and superior articular process. For the L5 dorsal ramus, the target is the notch between superior articular process and sacral ala. For the S1 dorsal ramus, the target is the superior and lateral edge of the posterior S1 Sacral foramen. Approach: Paramedial approach. Area Prepped: Entire Posterior Lumbosacral Region Prepping solution: ChloraPrep (2% chlorhexidine gluconate and 70% isopropyl alcohol) Safety Precautions: Aspiration looking for blood return was conducted prior to all injections. At no point  did we inject any substances, as a needle was being advanced. No attempts were made at seeking any paresthesias. Safe injection practices and needle disposal techniques used. Medications properly checked for expiration dates. SDV (single dose vial) medications used.  Description of the Procedure: Protocol guidelines were followed. The patient was placed in position over the fluoroscopy table. The target area was identified and the area prepped in the usual manner. Skin desensitized  using vapocoolant spray. Skin & deeper tissues infiltrated with local anesthetic. Appropriate amount of time allowed to pass for local anesthetics to take effect. The procedure needle was introduced through the skin, ipsilateral to the reported pain, and advanced to the target area. Employing the "Medial Branch Technique", the needles were advanced to the angle made by the superior and medial portion of the transverse process, and the lateral and inferior portion of the superior articulating process of the targeted vertebral bodies. This area is known as "Burton's Eye" or the "Eye of the [place] Dog". A procedure needle was introduced through the skin, and this time advanced to the angle made by the superior and medial border of the sacral ala, and the lateral border of the S1 vertebral body. This last needle was later repositioned at the superior and lateral border of the posterior S1 foramen. Negative aspiration confirmed. Solution injected in intermittent fashion, asking for systemic symptoms every 0.5cc of injectate. The needles were then removed and the area cleansed, making sure to leave some of the prepping solution back to take advantage of its long term bactericidal properties. Start Time: 0854 hrs. Materials:  Needle(s) Type: Regular needle Gauge: 22G Length: 3.5-in Medication(s): We administered lactated ringers, midazolam, fentaNYL, lidocaine, triamcinolone acetonide, ropivacaine (PF) 2 mg/mL (0.2%), triamcinolone  acetonide, ropivacaine (PF) 2 mg/mL (0.2%), ropivacaine (PF) 2 mg/mL (0.2%), and methylPREDNISolone acetate. Please see chart orders for dosing details.  Description of Procedure # 2 Process:   Position: Prone Target Area: For upper sacroiliac joint block(s), the target is the superior and posterior margin of the sacroiliac joint. Approach: Ipsilateral approach. Area Prepped: Entire Posterior Lumbosacral Region Prepping solution: ChloraPrep (2% chlorhexidine gluconate and 70% isopropyl alcohol) Safety Precautions: Aspiration looking for blood return was conducted prior to all injections. At no point did we inject any substances, as a needle was being advanced. No attempts were made at seeking any paresthesias. Safe injection practices and needle disposal techniques used. Medications properly checked for expiration dates. SDV (single dose vial) medications used. Description of the Procedure: Protocol guidelines were followed. The patient was placed in position over the fluoroscopy table. The target area was identified and the area prepped in the usual manner. Skin desensitized using vapocoolant spray. Skin & deeper tissues infiltrated with local anesthetic. Appropriate amount of time allowed to pass for local anesthetics to take effect. The procedure needle was advanced under fluoroscopic guidance into the sacroiliac joint until a firm endpoint was obtained. Proper needle placement secured. Negative aspiration confirmed. Solution injected in intermittent fashion, asking for systemic symptoms every 0.5cc of injectate. The needles were then removed and the area cleansed, making sure to leave some of the prepping solution back to take advantage of its long term bactericidal properties. Vitals:   10/25/16 0908 10/25/16 0918 10/25/16 0928 10/25/16 0938  BP: (!) 150/72 (!) 148/67 (!) 152/66 (!) 153/72  Pulse: 68     Resp: 16 13 12 16   Temp:  (!) 97.5 F (36.4 C)    TempSrc:  Temporal    SpO2: 98% 92% 100%  100%  Weight:      Height:        End Time: 0907 hrs. Materials:  Needle(s) Type: Regular needle Gauge: 22G Length: 3.5-in Medication(s): We administered lactated ringers, midazolam, fentaNYL, lidocaine, triamcinolone acetonide, ropivacaine (PF) 2 mg/mL (0.2%), triamcinolone acetonide, ropivacaine (PF) 2 mg/mL (0.2%), ropivacaine (PF) 2 mg/mL (0.2%), and methylPREDNISolone acetate. Please see chart orders for dosing details.  Imaging Guidance (Spinal):  Type of  Imaging Technique: Fluoroscopy Guidance (Spinal) Indication(s): Assistance in needle guidance and placement for procedures requiring needle placement in or near specific anatomical locations not easily accessible without such assistance. Exposure Time: Please see nurses notes. Contrast: None used. Fluoroscopic Guidance: I was personally present during the use of fluoroscopy. "Tunnel Vision Technique" used to obtain the best possible view of the target area. Parallax error corrected before commencing the procedure. "Direction-depth-direction" technique used to introduce the needle under continuous pulsed fluoroscopy. Once target was reached, antero-posterior, oblique, and lateral fluoroscopic projection used confirm needle placement in all planes. Images permanently stored in EMR. Interpretation: No contrast injected. I personally interpreted the imaging intraoperatively. Adequate needle placement confirmed in multiple planes. Permanent images saved into the patient's record.  Antibiotic Prophylaxis:  Indication(s): None identified Antibiotic given: None  Post-operative Assessment:  EBL: None Complications: No immediate post-treatment complications observed by team, or reported by patient. Note: The patient tolerated the entire procedure well. A repeat set of vitals were taken after the procedure and the patient was kept under observation following institutional policy, for this type of procedure. Post-procedural neurological  assessment was performed, showing return to baseline, prior to discharge. The patient was provided with post-procedure discharge instructions, including a section on how to identify potential problems. Should any problems arise concerning this procedure, the patient was given instructions to immediately contact us, at any time, without hesitation. In any case, we plan to contact the patient by telephone for a follow-up status report regarding this interventional procedure. Comments:  No additional relevant information.  Plan of Care  Interventional management options: Plan:   Evaluate results of today's procedure and medication management. Should the patient get more than 50% relief of the pain for the duration of the local anesthetic, but no long-term benefit, the procedure will be repeated in an effort to determine if a second treatment can improve the duration and effectiveness of the block and also to determine if the patient may be a good candidate for RFA.    Imaging Orders     DG C-Arm 1-60 Min-No Report  Procedure Orders     LUMBAR FACET(MEDIAL BRANCH NERVE BLOCK) MBNB     SACROILIAC JOINT INJECTINS  Medications ordered for procedure: Meds ordered this encounter  Medications  . lactated ringers infusion 1,000 mL  . midazolam (VERSED) 5 MG/5ML injection 1-2 mg    Make sure Flumazenil is available in the pyxis when using this medication. If oversedation occurs, administer 0.2 mg IV over 15 sec. If after 45 sec no response, administer 0.2 mg again over 1 min; may repeat at 1 min intervals; not to exceed 4 doses (1 mg)  . fentaNYL (SUBLIMAZE) injection 25-50 mcg    Make sure Narcan is available in the pyxis when using this medication. In the event of respiratory depression (RR< 8/min): Titrate NARCAN (naloxone) in increments of 0.1 to 0.2 mg IV at 2-3 minute intervals, until desired degree of reversal.  . lidocaine (XYLOCAINE) 2 % (with pres) injection 200 mg  . triamcinolone acetonide  (KENALOG-40) injection 40 mg  . ropivacaine (PF) 2 mg/mL (0.2%) (NAROPIN) injection 9 mL  . triamcinolone acetonide (KENALOG-40) injection 40 mg  . ropivacaine (PF) 2 mg/mL (0.2%) (NAROPIN) injection 9 mL  . ropivacaine (PF) 2 mg/mL (0.2%) (NAROPIN) injection 9 mL  . methylPREDNISolone acetate (DEPO-MEDROL) injection 80 mg   Medications administered: We administered lactated ringers, midazolam, fentaNYL, lidocaine, triamcinolone acetonide, ropivacaine (PF) 2 mg/mL (0.2%), triamcinolone acetonide, ropivacaine (PF) 2 mg/mL (0.2%), ropivacaine (  PF) 2 mg/mL (0.2%), and methylPREDNISolone acetate.  See the medical record for exact dosing, route, and time of administration.  New Prescriptions   No medications on file   Disposition: Discharge home  Discharge Date & Time: 10/25/2016; 0945 hrs.   Physician-requested Follow-up: Return for post-procedure eval by Dr. Dossie Arbour in 2 wks. Future Appointments Date Time Provider [place]  11/15/2016 9:00 AM Vevelyn Francois, NP [facility] None   Primary Care Physician: Cletis Athens, MD Location: [facility] Note by: Gaspar Cola, MD Date: 10/25/2016; Time: 10:25 AM  Disclaimer:  Medicine is not an exact science. The only guarantee in medicine is that nothing is guaranteed. It is important to note that the decision to proceed with this intervention was based on the information collected from the patient. The Data and conclusions were drawn from the patient's questionnaire, the interview, and the physical examination. Because the information was provided in large part by the patient, it cannot be guaranteed that it has not been purposely or unconsciously manipulated. Every effort has been made to obtain as much relevant data as possible for this evaluation. It is important to note that the conclusions that lead to this procedure are derived in large part from the available data. Always take into account that the  treatment will also be dependent on availability of resources and existing treatment guidelines, considered by other Pain Management Practitioners as being common knowledge and practice, at the time of the intervention. For Medico-Legal purposes, it is also important to point out that variation in procedural techniques and pharmacological choices are the acceptable norm. The indications, contraindications, technique, and results of the above procedure should only be interpreted and judged by a Board-Certified Interventional Pain Specialist with extensive familiarity and expertise in the same exact procedure and technique.

## 2016-10-25 NOTE — Patient Instructions (Signed)

## 2016-10-26 ENCOUNTER — Telehealth: Payer: Self-pay

## 2016-10-26 NOTE — Telephone Encounter (Signed)
Denies any needs at this time. Instructed to call if needed. 

## 2016-11-12 ENCOUNTER — Ambulatory Visit: Payer: Medicare HMO | Admitting: Pain Medicine

## 2016-11-14 DIAGNOSIS — E889 Metabolic disorder, unspecified: Secondary | ICD-10-CM | POA: Diagnosis not present

## 2016-11-14 DIAGNOSIS — E1169 Type 2 diabetes mellitus with other specified complication: Secondary | ICD-10-CM | POA: Diagnosis not present

## 2016-11-14 DIAGNOSIS — E119 Type 2 diabetes mellitus without complications: Secondary | ICD-10-CM | POA: Diagnosis not present

## 2016-11-14 DIAGNOSIS — M5440 Lumbago with sciatica, unspecified side: Secondary | ICD-10-CM | POA: Diagnosis not present

## 2016-11-15 ENCOUNTER — Encounter: Payer: Self-pay | Admitting: Nurse Practitioner

## 2016-11-15 ENCOUNTER — Ambulatory Visit: Payer: Medicare HMO | Attending: Nurse Practitioner | Admitting: Nurse Practitioner

## 2016-11-15 ENCOUNTER — Other Ambulatory Visit: Payer: Self-pay | Admitting: Cardiology

## 2016-11-15 VITALS — BP 160/72 | HR 74 | Temp 98.6°F | Resp 18 | Ht 62.0 in | Wt 184.0 lb

## 2016-11-15 DIAGNOSIS — Z87891 Personal history of nicotine dependence: Secondary | ICD-10-CM | POA: Diagnosis not present

## 2016-11-15 DIAGNOSIS — M25562 Pain in left knee: Secondary | ICD-10-CM | POA: Insufficient documentation

## 2016-11-15 DIAGNOSIS — G894 Chronic pain syndrome: Secondary | ICD-10-CM | POA: Insufficient documentation

## 2016-11-15 DIAGNOSIS — G8929 Other chronic pain: Secondary | ICD-10-CM | POA: Diagnosis not present

## 2016-11-15 DIAGNOSIS — Z9884 Bariatric surgery status: Secondary | ICD-10-CM | POA: Insufficient documentation

## 2016-11-15 DIAGNOSIS — Z888 Allergy status to other drugs, medicaments and biological substances status: Secondary | ICD-10-CM | POA: Diagnosis not present

## 2016-11-15 DIAGNOSIS — E079 Disorder of thyroid, unspecified: Secondary | ICD-10-CM | POA: Insufficient documentation

## 2016-11-15 DIAGNOSIS — M545 Low back pain: Secondary | ICD-10-CM | POA: Insufficient documentation

## 2016-11-15 DIAGNOSIS — M961 Postlaminectomy syndrome, not elsewhere classified: Secondary | ICD-10-CM | POA: Insufficient documentation

## 2016-11-15 DIAGNOSIS — Z1231 Encounter for screening mammogram for malignant neoplasm of breast: Secondary | ICD-10-CM

## 2016-11-15 DIAGNOSIS — M533 Sacrococcygeal disorders, not elsewhere classified: Secondary | ICD-10-CM

## 2016-11-15 DIAGNOSIS — Z7951 Long term (current) use of inhaled steroids: Secondary | ICD-10-CM | POA: Insufficient documentation

## 2016-11-15 DIAGNOSIS — M160 Bilateral primary osteoarthritis of hip: Secondary | ICD-10-CM | POA: Diagnosis not present

## 2016-11-15 DIAGNOSIS — M488X6 Other specified spondylopathies, lumbar region: Secondary | ICD-10-CM | POA: Insufficient documentation

## 2016-11-15 DIAGNOSIS — Z90710 Acquired absence of both cervix and uterus: Secondary | ICD-10-CM | POA: Diagnosis not present

## 2016-11-15 DIAGNOSIS — Z79899 Other long term (current) drug therapy: Secondary | ICD-10-CM

## 2016-11-15 DIAGNOSIS — M5136 Other intervertebral disc degeneration, lumbar region: Secondary | ICD-10-CM | POA: Diagnosis not present

## 2016-11-15 DIAGNOSIS — M25551 Pain in right hip: Secondary | ICD-10-CM | POA: Insufficient documentation

## 2016-11-15 DIAGNOSIS — Z79891 Long term (current) use of opiate analgesic: Secondary | ICD-10-CM | POA: Diagnosis not present

## 2016-11-15 DIAGNOSIS — M4316 Spondylolisthesis, lumbar region: Secondary | ICD-10-CM | POA: Diagnosis not present

## 2016-11-15 DIAGNOSIS — Z825 Family history of asthma and other chronic lower respiratory diseases: Secondary | ICD-10-CM | POA: Insufficient documentation

## 2016-11-15 DIAGNOSIS — Z981 Arthrodesis status: Secondary | ICD-10-CM | POA: Insufficient documentation

## 2016-11-15 DIAGNOSIS — M25552 Pain in left hip: Secondary | ICD-10-CM | POA: Diagnosis not present

## 2016-11-15 DIAGNOSIS — Z9049 Acquired absence of other specified parts of digestive tract: Secondary | ICD-10-CM | POA: Diagnosis not present

## 2016-11-15 DIAGNOSIS — Z8249 Family history of ischemic heart disease and other diseases of the circulatory system: Secondary | ICD-10-CM | POA: Insufficient documentation

## 2016-11-15 DIAGNOSIS — E785 Hyperlipidemia, unspecified: Secondary | ICD-10-CM | POA: Insufficient documentation

## 2016-11-15 DIAGNOSIS — M5442 Lumbago with sciatica, left side: Secondary | ICD-10-CM | POA: Diagnosis not present

## 2016-11-15 DIAGNOSIS — M25512 Pain in left shoulder: Secondary | ICD-10-CM | POA: Insufficient documentation

## 2016-11-15 DIAGNOSIS — M5441 Lumbago with sciatica, right side: Secondary | ICD-10-CM | POA: Diagnosis not present

## 2016-11-15 DIAGNOSIS — M5030 Other cervical disc degeneration, unspecified cervical region: Secondary | ICD-10-CM | POA: Insufficient documentation

## 2016-11-15 MED ORDER — TIZANIDINE HCL 4 MG PO TABS: 4.0000 mg | ORAL_TABLET | Freq: Three times a day (TID) | ORAL | 0 refills | Status: DC

## 2016-11-15 MED ORDER — TRAMADOL HCL 50 MG PO TABS: 50.0000 mg | ORAL_TABLET | Freq: Four times a day (QID) | ORAL | 0 refills | Status: DC | PRN

## 2016-11-15 NOTE — Progress Notes (Signed)
Patient's Name: Ashley Fields  MRN: [id number]  Referring Provider: Cletis Athens, MD  DOB: 1955/03/03  PCP: Cletis Athens, MD  DOS: 11/15/2016  Note by: Vevelyn Francois NP  Service setting: Ambulatory outpatient  Specialty: Interventional Pain Management  Location: [facility] (AMB) Pain Management Facility    Patient type: Established    Primary Reason(s) for Visit: Encounter for prescription drug management & post-procedure evaluation of chronic illness with mild to moderate exacerbation(Level of risk: moderate) CC: Hip Pain (bilateral) and Back Pain (low)  HPI  Ashley Fields is a 61 y.o. year old, female patient, who comes today for a post-procedure evaluation and medication management. She has Chronic pain syndrome; Chronic neck pain ; Chronic hip pain (Secondary Area of Pain) (Bilateral) (L>R); Long term current use of opiate analgesic; Long term prescription opiate use; Opiate use; Chronic knee pain (Left); Osteoarthritis of hip (Bilateral); Chronic Low back pain (Primary Area of Pain) (Bilateral)  (R>L); Chronic pain of lower extremity (Tertiary Area of Pain) (Bilateral) (L>R); Long term prescription benzodiazepine use; Grade 1 Anterolisthesis of L3 over L4; Failed back surgical syndrome; Chronic shoulder pain (Left); History of cervical fusion (ACDF C4-C7); DDD (degenerative disc disease), cervical; Chronic sacroiliac joint pain (Bilateral) (R>L); Lumbar Facet Syndrome (Bilateral) (R>L); Lumbar facet osteoarthritis (Bilateral); and DDD (degenerative disc disease), lumbar on their problem list. Her primarily concern today is the Hip Pain (bilateral) and Back Pain (low)  Pain Assessment: Location: Lower, Right, Left Back Radiating: hips, states knees feel weak Onset: More than a month ago Duration: Chronic pain Quality: ("solid pain that just hurts") Severity: 2 /10 (self-reported pain score)  Note: Reported level is compatible with observation.                          Effect on ADL:   Timing:  Intermittent Modifying factors: medications, positioning, procedures, heat occassionally  Ashley Fields was last seen on 08/14/2016 for a procedure. During today's appointment we reviewed Ashley Fields's post-procedure results, as well as her outpatient medication regimen.  She states that she has weakness in her left knee. She states that this pain goes into her knee. She states that she has to be careful. She denies knee pain. She denies any numbness or tingling since her injections. She admits that she has urinary incontinence when her back pain gets worse.   Further details on both, my assessment(s), as well as the proposed treatment plan, please see below.  Controlled Substance Pharmacotherapy Assessment REMS (Risk Evaluation and Mitigation Strategy)  Analgesic: Tramadol 50 mg, 1 tab PO q6hrs (200 mg/day) MME/day: 20 mg/day.  Hart Rochester, RN  11/15/2016  9:08 AM  Sign at close encounter Nursing Pain Medication Assessment:  Safety precautions to be maintained throughout the outpatient stay will include: orient to surroundings, keep bed in low position, maintain call bell within reach at all times, provide assistance with transfer out of bed and ambulation.  Medication Inspection Compliance: Ashley Fields did not comply with our request to bring her pills to be counted. She was reminded that bringing the medication bottles, even when empty, is a requirement.  Medication: None brought in. Pill/Patch Count: None available to be counted. Bottle Appearance: No container available. Did not bring bottle(s) to appointment. Filled Date: N/A Last Medication intake:  Today   Pharmacokinetics: Liberation and absorption (onset of action): WNL Distribution (time to peak effect): WNL Metabolism and excretion (duration of action): WNL  Pharmacodynamics: Desired effects: Analgesia: Ashley Fields reports >50% benefit. Functional ability: Patient reports that medication allows her to accomplish basic  ADLs Clinically meaningful improvement in function (CMIF): Sustained CMIF goals met Perceived effectiveness: Described as relatively effective, allowing for increase in activities of daily living (ADL) Undesirable effects: Side-effects or Adverse reactions: None reported Monitoring: [place] PMP: Online review of the past 28-monthperiod conducted. Abnormal. Results discussed with patient Last UDS on record: Summary  Date Value Ref Range Status  08/14/2016 FINAL  Final    Comment:    ==================================================================== TOXAS[facility]RE COMP DRUG ANALYSIS,UR ==================================================================== Test                             Result       Flag       Units Drug Present and Declared for Prescription Verification   Lorazepam                      2290         EXPECTED   ng/mg creat    Source of lorazepam is a scheduled prescription medication.   Oxymorphone                    683          EXPECTED   ng/mg creat   Noroxycodone                   3024         EXPECTED   ng/mg creat   Noroxymorphone                 1675         EXPECTED   ng/mg creat    Oxymorphone, noroxycodone and noroxymorphone are expected    metabolites of oxycodone. Noroxymorphone is an expected    metabolite of oxymorphone. Sources of oxycodone and/or    oxymorphone include scheduled prescription medications.   Gabapentin                     PRESENT      EXPECTED   Sertraline                     PRESENT      EXPECTED   Desmethylsertraline            PRESENT      EXPECTED    Desmethylsertraline is an expected metabolite of sertraline.   Acetaminophen                  PRESENT      EXPECTED Drug Present not Declared for Prescription Verification   Trazodone                      PRESENT      UNEXPECTED   1,3 chlorophenyl piperazine    PRESENT      UNEXPECTED    1,3-chlorophenyl piperazine is an expected metabolite of    trazodone. Drug Absent but Declared for  Prescription Verification   Oxycodone                      Not Detected UNEXPECTED ng/mg creat    Oxycodone is almost always present in patients taking this drug    consistently.  Absence of oxycodone could be due to lapse of time    since the last dose or  unusual pharmacokinetics (rapid    metabolism).   Butalbital                     Not Detected UNEXPECTED   Tizanidine                     Not Detected UNEXPECTED    Tizanidine, as indicated in the declared medication list, is not    always detected even when used as directed. ==================================================================== Test                      Result    Flag   Units      Ref Range   Creatinine              84               mg/dL      >=20 ==================================================================== Declared Medications:  The flagging and interpretation on this report are based on the  following declared medications.  Unexpected results may arise from  inaccuracies in the declared medications.  **Note: The testing scope of this panel includes these medications:  Butalbital (Fioricet)  Gabapentin (Neurontin)  Lorazepam  Oxycodone (Oxycodone Acetaminophen)  Sertraline  **Note: The testing scope of this panel does not include small to  moderate amounts of these reported medications:  Acetaminophen (Fioricet)  Acetaminophen (Oxycodone Acetaminophen)  Tizanidine  **Note: The testing scope of this panel does not include following  reported medications:  Brexpiprazole (Rexulti)  Fluticasone (Flonase)  Levothyroxine  Metronidazole (Flagyl)  Omeprazole  Simvastatin ==================================================================== For clinical consultation, please call [phone]. ====================================================================    UDS interpretation: Non-Compliant         UDS repeated today Medication Assessment Form: Reviewed. Abnormalities discussed Treatment  compliance: Deficiencies noted and steps taken to remind the patient of the seriousness of adequate therapy compliance Risk Assessment Profile: Aberrant behavior: See prior evaluations. None observed or detected today Comorbid factors increasing risk of overdose: See prior notes. No additional risks detected today Risk of substance use disorder (SUD): Low Opioid Risk Tool - 09/19/16 0820      Family History of Substance Abuse   Alcohol  Negative    Illegal Drugs  Negative    Rx Drugs  Negative      Personal History of Substance Abuse   Alcohol  Negative    Illegal Drugs  Negative    Rx Drugs  Negative      Age   Age between 45-45 years   No      History of Preadolescent Sexual Abuse   History of Preadolescent Sexual Abuse  Negative or Female      Psychological Disease   Psychological Disease  Negative    Depression  Positive takes med   takes med     Total Score   Opioid Risk Tool Scoring  1    Opioid Risk Interpretation  Low Risk      ORT Scoring interpretation table:  Score <3 = Low Risk for SUD  Score between 4-7 = Moderate Risk for SUD  Score >8 = High Risk for Opioid Abuse   Risk Mitigation Strategies:  Patient Counseling: Covered Patient-Prescriber Agreement (PPA): Present and active  Notification to other healthcare providers: Done  Pharmacologic Plan: No change in therapy, at this time  Post-Procedure Assessment  08/14/2016 Procedure: 10/25/16 bilateral Lumbar facet and SI joint injections Pre-procedure pain score:  3/10 Post-procedure pain score: 0/10  Influential Factors: BMI: 33.65 kg/m Intra-procedural challenges: None observed.         Assessment challenges: None detected.              Reported side-effects: None.        Post-procedural adverse reactions or complications: None reported         Sedation: Please see nurses note. When no sedatives are used, the analgesic levels obtained are directly associated to the effectiveness of the local  anesthetics. However, when sedation is provided, the level of analgesia obtained during the initial 1 hour following the intervention, is believed to be the result of a combination of factors. These factors may include, but are not limited to: 1. The effectiveness of the local anesthetics used. 2. The effects of the analgesic(s) and/or anxiolytic(s) used. 3. The degree of discomfort experienced by the patient at the time of the procedure. 4. The patients ability and reliability in recalling and recording the events. 5. The presence and influence of possible secondary gains and/or psychosocial factors. Reported result: Relief experienced during the 1st hour after the procedure: 100 % (Ultra-Short Term Relief)            Interpretative annotation: Clinically appropriate result. Analgesia during this period is likely to be Local Anesthetic and/or IV Sedative (Analgesic/Anxiolytic) related.          Effects of local anesthetic: The analgesic effects attained during this period are directly associated to the localized infiltration of local anesthetics and therefore cary significant diagnostic value as to the etiological location, or anatomical origin, of the pain. Expected duration of relief is directly dependent on the pharmacodynamics of the local anesthetic used. Long-acting (4-6 hours) anesthetics used.  Reported result: Relief during the next 4 to 6 hour after the procedure: 0 % (Short-Term Relief)            Interpretative annotation: Clinically appropriate result. Analgesia during this period is likely to be Local Anesthetic-related.          Long-term benefit: Defined as the period of time past the expected duration of local anesthetics (1 hour for short-acting and 4-6 hours for long-acting). With the possible exception of prolonged sympathetic blockade from the local anesthetics, benefits during this period are typically attributed to, or associated with, other factors such as analgesic sensory  neuropraxia, antiinflammatory effects, or beneficial biochemical changes provided by agents other than the local anesthetics.  Reported result: Extended relief following procedure: 60 %(after 11 days) (Long-Term Relief)            Interpretative annotation: Clinically appropriate result. Good relief. No permanent benefit expected. Inflammation plays a part in the etiology to the pain.          Current benefits: Defined as reported results that persistent at this point in time.   Analgesia: >50 % Ashley Fields reports improvement of extremity symptoms. Function: Somewhat improved ROM: Somewhat improved Interpretative annotation: Recurrence of symptoms. No permanent benefit expected. Effective diagnostic intervention.          Interpretation: Results would suggest a successful diagnostic intervention.                  Plan:  Please see "Plan of Care" for details.        Laboratory Chemistry  Inflammation Markers (CRP: Acute Phase) (ESR: Chronic Phase) Lab Results  Component Value Date   CRP 0.7 08/14/2016   ESRSEDRATE 9 08/14/2016  Renal Function Markers Lab Results  Component Value Date   BUN 17 08/14/2016   CREATININE 0.80 08/14/2016   GFRAA 93 08/14/2016   GFRNONAA 80 08/14/2016                 Hepatic Function Markers Lab Results  Component Value Date   AST 20 08/14/2016   ALT 10 08/14/2016   ALBUMIN 4.9 (H) 08/14/2016   ALKPHOS 103 08/14/2016                 Electrolytes Lab Results  Component Value Date   NA 139 08/14/2016   K 4.1 08/14/2016   CL 97 08/14/2016   CALCIUM 9.6 08/14/2016   MG 2.3 08/14/2016                 Neuropathy Markers Lab Results  Component Value Date   VITAMINB12 473 08/14/2016                 Bone Pathology Markers Lab Results  Component Value Date   ALKPHOS 103 08/14/2016   25OHVITD1 47 08/14/2016   25OHVITD2 <1.0 08/14/2016   25OHVITD3 47 08/14/2016   CALCIUM 9.6 08/14/2016                 Rheumatology  Markers No results found for: LABURIC              Coagulation Parameters No results found for: INR, LABPROT, APTT, PLT               Cardiovascular Markers No results found for: BNP, CKMB, TROPONINI, HGB, HCT               CA Markers No results found for: CEA, CA125               Note: Lab results reviewed.  Recent Diagnostic Imaging Results  DG C-Arm 1-60 Min-No Report Fluoroscopy was utilized by the requesting physician.  No radiographic  interpretation.   Complexity Note: Imaging results reviewed. Results shared with Ashley Fields, using Layman's terms.                         Meds   Current Outpatient Medications:  .  Biotin 10000 MCG TABS, Take 10,000 mcg by mouth 1 day or 1 dose., Disp: , Rfl:  .  butalbital-acetaminophen-caffeine (FIORICET, ESGIC) 50-325-40 MG tablet, TAKE 1 TABLET BY MOUTH EVERY NIGHT AS NEEDED FOR HEADACHE, Disp: , Rfl: 2 .  ferrous [id number] C-folic acid (TRINSICON / FOLTRIN) capsule, Take 1 capsule by mouth 3 (three) times daily after meals., Disp: , Rfl:  .  fluticasone (FLONASE) 50 MCG/ACT nasal spray, 1 SPRAY IN EACH NOSTRIL ONCE A DAY NASALLY 30 DAYS, Disp: , Rfl: 3 .  gabapentin (NEURONTIN) 300 MG capsule, TAKE ONE CAPSULE BY MOUTH THREE times a day, Disp: , Rfl: 1 .  LORazepam (ATIVAN) 0.5 MG tablet, Take 0.5 mg by mouth 2 (two) times daily as needed., Disp: , Rfl: 0 .  Melatonin 10 MG CAPS, Take 1 capsule by mouth at bedtime., Disp: , Rfl:  .  Multiple Vitamin (MULTIVITAMIN WITH MINERALS) TABS tablet, Take 1 tablet by mouth daily., Disp: , Rfl:  .  omeprazole (PRILOSEC) 40 MG capsule, Take 40 mg by mouth daily., Disp: , Rfl: 0 .  sertraline (ZOLOFT) 100 MG tablet, Take 200 mg by mouth 2 (two) times daily. , Disp: , Rfl: 0 .  simvastatin (ZOCOR) 20 MG tablet, TAKE 1 TABLET  IN THE EVENING ONCE A DAY ORALLY 90 DAYS, Disp: , Rfl: 0 .  [START ON 11/16/2016] tiZANidine (ZANAFLEX) 4 MG tablet, Take 1 tablet (4 mg total) 3 (three) times daily by  mouth., Disp: 90 tablet, Rfl: 0 .  [START ON 11/16/2016] traMADol (ULTRAM) 50 MG tablet, Take 1 tablet (50 mg total) every 6 (six) hours as needed by mouth for severe pain., Disp: 120 tablet, Rfl: 0 .  traZODone (DESYREL) 150 MG tablet, Take 150 mg by mouth at bedtime., Disp: , Rfl: 0 .  Turmeric 500 MG CAPS, Take 2 capsules by mouth daily., Disp: , Rfl:  .  vitamin C (ASCORBIC ACID) 500 MG tablet, Take 500 mg 2 (two) times daily by mouth., Disp: , Rfl:   ROS  Constitutional: Denies any fever or chills Gastrointestinal: No reported hemesis, hematochezia, vomiting, or acute GI distress Musculoskeletal: Denies any acute onset joint swelling, redness, loss of ROM, or weakness Neurological: No reported episodes of acute onset apraxia, aphasia, dysarthria, agnosia, amnesia, paralysis, loss of coordination, or loss of consciousness  Allergies  Ashley Fields is allergic to flagyl [metronidazole].  PFSH  Drug: Ashley Fields  reports that she does not use drugs. Alcohol:  reports that she does not drink alcohol. Tobacco:  reports that she has quit smoking. She quit after 4.00 years of use. she has never used smokeless tobacco. Medical:  has a past medical history of Allergy, Hyperlipidemia, and Thyroid disease. Surgical: Ashley Fields  has a past surgical history that includes Cesarean section; Abdominal hysterectomy; Breast reduction surgery; Cholecystectomy; Appendectomy; Gastric bypass (2005); spinal stimulator removed 2012 (2012); acrominoplasty (2015); mini tummy tuck; breast lift bilateral, implants; Lumbar laminectomy (2008); 5 miscarriages; and neck surgery C3-C7. Family: family history includes COPD in her mother; Cancer in her mother; Heart disease in her father.  Constitutional Exam  General appearance: Well nourished, well developed, and well hydrated. In no apparent acute distress Vitals:   11/15/16 0911  BP: (!) 160/72  Pulse: 74  Resp: 18  Temp: 98.6 F (37 C)  TempSrc: Oral  SpO2: 99%   Weight: 184 lb (83.5 kg)  Height: _0  (1.575 m)   BMI Assessment: Estimated body mass index is 33.65 kg/m as calculated from the following:   Height as of this encounter: _1  (1.575 m).   Weight as of this encounter: 184 lb (83.5 kg). Psych/Mental status: Alert, oriented x 3 (person, place, & time)       Eyes: PERLA Respiratory: No evidence of acute respiratory distress  Lumbar Spine Area Exam  Skin & Axial Inspection: Well healed scar from previous spine surgery detected Alignment: Symmetrical Functional ROM: Unrestricted ROM      Stability: No instability detected Muscle Tone/Strength: Functionally intact. No obvious neuro-muscular anomalies detected. Sensory (Neurological): Unimpaired Palpation: Complains of area being tender to palpation       Provocative Tests: Lumbar Hyperextension and rotation test: Positive bilaterally for facet joint pain. Lumbar Lateral bending test: evaluation deferred today       Patrick's Maneuver: evaluation deferred today                    Gait & Posture Assessment  Ambulation: Patient ambulates using a cane Gait: Relatively normal for age and body habitus Posture: WNL   Lower Extremity Exam    Side: Right lower extremity  Side: Left lower extremity  Skin & Extremity Inspection: Skin color, temperature, and hair growth are WNL. No peripheral edema or cyanosis. No masses, redness,  swelling, asymmetry, or associated skin lesions. No contractures.  Skin & Extremity Inspection: Skin color, temperature, and hair growth are WNL. No peripheral edema or cyanosis. No masses, redness, swelling, asymmetry, or associated skin lesions. No contractures.  Functional ROM: Unrestricted ROM          Functional ROM: Unrestricted ROM          Muscle Tone/Strength: Functionally intact. No obvious neuro-muscular anomalies detected.  Muscle Tone/Strength: Functionally intact. No obvious neuro-muscular anomalies detected.  Sensory (Neurological): Unimpaired  Sensory  (Neurological): Unimpaired  Palpation: No palpable anomalies  Palpation: No palpable anomalies   Assessment  Primary Diagnosis & Pertinent Problem List: The primary encounter diagnosis was Chronic Low back pain (Primary Area of Pain) (Bilateral)  (R>L). Diagnoses of Chronic sacroiliac joint pain (Bilateral) (R>L), Chronic knee pain (Left), Chronic pain syndrome, and Long term prescription benzodiazepine use were also pertinent to this visit.  Status Diagnosis  Controlled Controlled Controlled 1. Chronic Low back pain (Primary Area of Pain) (Bilateral)  (R>L)   2. Chronic sacroiliac joint pain (Bilateral) (R>L)   3. Chronic knee pain (Left)   4. Chronic pain syndrome   5. Long term prescription benzodiazepine use     Problems updated and reviewed during this visit: No problems updated. Plan of Care  Pharmacotherapy (Medications Ordered): Meds ordered this encounter  Medications  . traMADol (ULTRAM) 50 MG tablet    Sig: Take 1 tablet (50 mg total) every 6 (six) hours as needed by mouth for severe pain.    Dispense:  120 tablet    Refill:  0    Fill one day early if pharmacy is closed on scheduled refill date. Do not fill until: 11/16/2016 To last until: 12/16/2016    Order Specific Question:   Supervising Provider    Answer:   Milinda Pointer [phone]  . tiZANidine (ZANAFLEX) 4 MG tablet    Sig: Take 1 tablet (4 mg total) 3 (three) times daily by mouth.    Dispense:  90 tablet    Refill:  0    Do not place medication on "Automatic Refill". Fill one day early if pharmacy is closed on scheduled refill date.    Order Specific Question:   Supervising Provider    Answer:   Milinda Pointer [[id number]]  This SmartLink is deprecated. Use AVSMEDLIST instead to display the medication list for a patient. Medications administered today: Evelyn Aguinaldo had no medications administered during this visit. Lab-work, procedure(s), and/or referral(s): Orders Placed This Encounter  Procedures  .  ToxASSURE Select 13 (MW), Urine   Imaging and/or referral(s): None  Interventional therapies: Planned, scheduled, and/or pending:   Not at this time. Will call.   Considering:   Diagnosticbilateral lumbar epidural steroid injection Possible bilateral lumbar facet block RFA Diagnosticleft hip injection Diagnosticleft femoral nerve + obturator nerve block  Diagnostic left knee intra-articular steroid injection Diagnosticleft genicular nerve block Possible left knee RFA  Diagnosticcervical epidural steroid injection Diagnosticbilateral cervical facet block Possiblebilateral cervical RFA   Provider-requested follow-up: Return in about 4 weeks (around 12/13/2016) for MedMgmt.  Future Appointments  Date Time Provider [place]  12/13/2016  9:00 AM Vevelyn Francois, NP [facility] None   Primary Care Physician: Cletis Athens, MD Location: [facility] Note by: Vevelyn Francois NP Date: 11/15/2016; Time: 10:03 AM  Pain Score Disclaimer: We use the NRS-11 scale. This is a self-reported, subjective measurement of pain severity with only modest accuracy. It is used primarily to identify changes within  a particular patient. It must be understood that outpatient pain scales are significantly less accurate that those used for research, where they can be applied under ideal controlled circumstances with minimal exposure to variables. In reality, the score is likely to be a combination of pain intensity and pain affect, where pain affect describes the degree of emotional arousal or changes in action readiness caused by the sensory experience of pain. Factors such as social and work situation, setting, emotional state, anxiety levels, expectation, and prior pain experience may influence pain perception and show large inter-individual differences that may also be affected by time variables.  Patient instructions provided during this appointment: Patient  Instructions   ____________________________________________________________________________________________  Pain Scale  Introduction: The pain score used by this practice is the Verbal Numerical Rating Scale (VNRS-11). This is an 11-point scale. It is for adults and children 10 years or older. There are significant differences in how the pain score is reported, used, and applied. Forget everything you learned in the past and learn this scoring system.  General Information: The scale should reflect your current level of pain. Unless you are specifically asked for the level of your worst pain, or your average pain. If you are asked for one of these two, then it should be understood that it is over the past 24 hours.  Basic Activities of Daily Living (ADL): Personal hygiene, dressing, eating, transferring, and using restroom.  Instructions: Most patients tend to report their level of pain as a combination of two factors, their physical pain and their psychosocial pain. This last one is also known as "suffering" and it is reflection of how physical pain affects you socially and psychologically. From now on, report them separately. From this point on, when asked to report your pain level, report only your physical pain. Use the following table for reference.  Pain Clinic Pain Levels (0-5/10)  Pain Level Score  Description  No Pain 0   Mild pain 1 Nagging, annoying, but does not interfere with basic activities of daily living (ADL). Patients are able to eat, bathe, get dressed, toileting (being able to get on and off the toilet and perform personal hygiene functions), transfer (move in and out of bed or a chair without assistance), and maintain continence (able to control bladder and bowel functions). Blood pressure and heart rate are unaffected. A normal heart rate for a healthy adult ranges from 60 to 100 bpm (beats per minute).   Mild to moderate pain 2 Noticeable and distracting. Impossible to hide  from other people. More frequent flare-ups. Still possible to adapt and function close to normal. It can be very annoying and may have occasional stronger flare-ups. With discipline, patients may get used to it and adapt.   Moderate pain 3 Interferes significantly with activities of daily living (ADL). It becomes difficult to feed, bathe, get dressed, get on and off the toilet or to perform personal hygiene functions. Difficult to get in and out of bed or a chair without assistance. Very distracting. With effort, it can be ignored when deeply involved in activities.   Moderately severe pain 4 Impossible to ignore for more than a few minutes. With effort, patients may still be able to manage work or participate in some social activities. Very difficult to concentrate. Signs of autonomic nervous system discharge are evident: dilated pupils (mydriasis); mild sweating (diaphoresis); sleep interference. Heart rate becomes elevated (>115 bpm). Diastolic blood pressure (lower number) rises above 100 mmHg. Patients find relief in laying down  and not moving.   Severe pain 5 Intense and extremely unpleasant. Associated with frowning face and frequent crying. Pain overwhelms the senses.  Ability to do any activity or maintain social relationships becomes significantly limited. Conversation becomes difficult. Pacing back and forth is common, as getting into a comfortable position is nearly impossible. Pain wakes you up from deep sleep. Physical signs will be obvious: pupillary dilation; increased sweating; goosebumps; brisk reflexes; cold, clammy hands and feet; nausea, vomiting or dry heaves; loss of appetite; significant sleep disturbance with inability to fall asleep or to remain asleep. When persistent, significant weight loss is observed due to the complete loss of appetite and sleep deprivation.  Blood pressure and heart rate becomes significantly elevated. Caution: If elevated blood pressure triggers a pounding  headache associated with blurred vision, then the patient should immediately seek attention at an urgent or emergency care unit, as these may be signs of an impending stroke.    Emergency Department Pain Levels (6-10/10)  Emergency Room Pain 6 Severely limiting. Requires emergency care and should not be seen or managed at an outpatient pain management facility. Communication becomes difficult and requires great effort. Assistance to reach the emergency department may be required. Facial flushing and profuse sweating along with potentially dangerous increases in heart rate and blood pressure will be evident.   Distressing pain 7 Self-care is very difficult. Assistance is required to transport, or use restroom. Assistance to reach the emergency department will be required. Tasks requiring coordination, such as bathing and getting dressed become very difficult.   Disabling pain 8 Self-care is no longer possible. At this level, pain is disabling. The individual is unable to do even the most "basic" activities such as walking, eating, bathing, dressing, transferring to a bed, or toileting. Fine motor skills are lost. It is difficult to think clearly.   Incapacitating pain 9 Pain becomes incapacitating. Thought processing is no longer possible. Difficult to remember your own name. Control of movement and coordination are lost.   The worst pain imaginable 10 At this level, most patients pass out from pain. When this level is reached, collapse of the autonomic nervous system occurs, leading to a sudden drop in blood pressure and heart rate. This in turn results in a temporary and dramatic drop in blood flow to the brain, leading to a loss of consciousness. Fainting is one of the body's self defense mechanisms. Passing out puts the brain in a calmed state and causes it to shut down for a while, in order to begin the healing process.    Summary: 1. Refer to this scale when providing [place] with your pain  level. 2. Be accurate and careful when reporting your pain level. This will help with your care. 3. Over-reporting your pain level will lead to loss of credibility. 4. Even a level of 1/10 means that there is pain and will be treated at our facility. 5. High, inaccurate reporting will be documented as "Symptom Exaggeration", leading to loss of credibility and suspicions of possible secondary gains such as obtaining more narcotics, or wanting to appear disabled, for fraudulent reasons. 6. Only pain levels of 5 or below will be seen at our facility. 7. Pain levels of 6 and above will be sent to the Emergency Department and the appointment cancelled. ____________________________________________________________________________________________    BMI Assessment: Estimated body mass index is 33.65 kg/m as calculated from the following:   Height as of this encounter: _0  (1.575 m).   Weight as of this  encounter: 184 lb (83.5 kg).  BMI interpretation table: BMI level Category Range association with higher incidence of chronic pain  <18 kg/m2 Underweight   18.5-24.9 kg/m2 Ideal body weight   25-29.9 kg/m2 Overweight Increased incidence by 20%  30-34.9 kg/m2 Obese (Class I) Increased incidence by 68%  35-39.9 kg/m2 Severe obesity (Class II) Increased incidence by 136%  >40 kg/m2 Extreme obesity (Class III) Increased incidence by 254%   BMI Readings from Last 4 Encounters:  11/15/16 33.65 kg/m  10/25/16 34.57 kg/m  10/15/16 34.75 kg/m  09/19/16 33.65 kg/m   Wt Readings from Last 4 Encounters:  11/15/16 184 lb (83.5 kg)  10/25/16 189 lb (85.7 kg)  10/15/16 190 lb (86.2 kg)  09/19/16 184 lb (83.5 kg)

## 2016-11-15 NOTE — Progress Notes (Signed)
Nursing Pain Medication Assessment:  Safety precautions to be maintained throughout the outpatient stay will include: orient to surroundings, keep bed in low position, maintain call bell within reach at all times, provide assistance with transfer out of bed and ambulation.  Medication Inspection Compliance: [name] did not comply with our request to bring her pills to be counted. She was reminded that bringing the medication bottles, even when empty, is a requirement.  Medication: None brought in. Pill/Patch Count: None available to be counted. Bottle Appearance: No container available. Did not bring bottle(s) to appointment. Filled Date: N/A Last Medication intake:  Today

## 2016-11-15 NOTE — Progress Notes (Signed)
Nursing Pain Medication Assessment:  Safety precautions to be maintained throughout the outpatient stay will include: orient to surroundings, keep bed in low position, maintain call bell within reach at all times, provide assistance with transfer out of bed and ambulation.  Medication Inspection Compliance: Pill count conducted under aseptic conditions, in front of the patient. Neither the pills nor the bottle was removed from the patient's sight at any time. Once count was completed pills were immediately returned to the patient in their original bottle.  Medication: Tramadol (Ultram) Pill/Patch Count: 19 of 120 pills remain Pill/Patch Appearance: Markings consistent with prescribed medication Bottle Appearance: Standard pharmacy container. Clearly labeled. Filled Date: 16 / 16 / 2018 Last Medication intake:  Today

## 2016-11-15 NOTE — Patient Instructions (Addendum)
____________________________________________________________________________________________  Pain Scale  Introduction: The pain score used by this practice is the Verbal Numerical Rating Scale (VNRS-11). This is an 11-point scale. It is for adults and children 10 years or older. There are significant differences in how the pain score is reported, used, and applied. Forget everything you learned in the past and learn this scoring system.  General Information: The scale should reflect your current level of pain. Unless you are specifically asked for the level of your worst pain, or your average pain. If you are asked for one of these two, then it should be understood that it is over the past 24 hours.  Basic Activities of Daily Living (ADL): Personal hygiene, dressing, eating, transferring, and using restroom.  Instructions: Most patients tend to report their level of pain as a combination of two factors, their physical pain and their psychosocial pain. This last one is also known as "suffering" and it is reflection of how physical pain affects you socially and psychologically. From now on, report them separately. From this point on, when asked to report your pain level, report only your physical pain. Use the following table for reference.  Pain Clinic Pain Levels (0-5/10)  Pain Level Score  Description  No Pain 0   Mild pain 1 Nagging, annoying, but does not interfere with basic activities of daily living (ADL). Patients are able to eat, bathe, get dressed, toileting (being able to get on and off the toilet and perform personal hygiene functions), transfer (move in and out of bed or a chair without assistance), and maintain continence (able to control bladder and bowel functions). Blood pressure and heart rate are unaffected. A normal heart rate for a healthy adult ranges from 60 to 100 bpm (beats per minute).   Mild to moderate pain 2 Noticeable and distracting. Impossible to hide from other  people. More frequent flare-ups. Still possible to adapt and function close to normal. It can be very annoying and may have occasional stronger flare-ups. With discipline, patients may get used to it and adapt.   Moderate pain 3 Interferes significantly with activities of daily living (ADL). It becomes difficult to feed, bathe, get dressed, get on and off the toilet or to perform personal hygiene functions. Difficult to get in and out of bed or a chair without assistance. Very distracting. With effort, it can be ignored when deeply involved in activities.   Moderately severe pain 4 Impossible to ignore for more than a few minutes. With effort, patients may still be able to manage work or participate in some social activities. Very difficult to concentrate. Signs of autonomic nervous system discharge are evident: dilated pupils (mydriasis); mild sweating (diaphoresis); sleep interference. Heart rate becomes elevated (>115 bpm). Diastolic blood pressure (lower number) rises above 100 mmHg. Patients find relief in laying down and not moving.   Severe pain 5 Intense and extremely unpleasant. Associated with frowning face and frequent crying. Pain overwhelms the senses.  Ability to do any activity or maintain social relationships becomes significantly limited. Conversation becomes difficult. Pacing back and forth is common, as getting into a comfortable position is nearly impossible. Pain wakes you up from deep sleep. Physical signs will be obvious: pupillary dilation; increased sweating; goosebumps; brisk reflexes; cold, clammy hands and feet; nausea, vomiting or dry heaves; loss of appetite; significant sleep disturbance with inability to fall asleep or to remain asleep. When persistent, significant weight loss is observed due to the complete loss of appetite and sleep deprivation.  Blood   pressure and heart rate becomes significantly elevated. Caution: If elevated blood pressure triggers a pounding headache  associated with blurred vision, then the patient should immediately seek attention at an urgent or emergency care unit, as these may be signs of an impending stroke.    Emergency Department Pain Levels (6-10/10)  Emergency Room Pain 6 Severely limiting. Requires emergency care and should not be seen or managed at an outpatient pain management facility. Communication becomes difficult and requires great effort. Assistance to reach the emergency department may be required. Facial flushing and profuse sweating along with potentially dangerous increases in heart rate and blood pressure will be evident.   Distressing pain 7 Self-care is very difficult. Assistance is required to transport, or use restroom. Assistance to reach the emergency department will be required. Tasks requiring coordination, such as bathing and getting dressed become very difficult.   Disabling pain 8 Self-care is no longer possible. At this level, pain is disabling. The individual is unable to do even the most "basic" activities such as walking, eating, bathing, dressing, transferring to a bed, or toileting. Fine motor skills are lost. It is difficult to think clearly.   Incapacitating pain 9 Pain becomes incapacitating. Thought processing is no longer possible. Difficult to remember your own name. Control of movement and coordination are lost.   The worst pain imaginable 10 At this level, most patients pass out from pain. When this level is reached, collapse of the autonomic nervous system occurs, leading to a sudden drop in blood pressure and heart rate. This in turn results in a temporary and dramatic drop in blood flow to the brain, leading to a loss of consciousness. Fainting is one of the body's self defense mechanisms. Passing out puts the brain in a calmed state and causes it to shut down for a while, in order to begin the healing process.    Summary: 1. Refer to this scale when providing [place] with your pain level. 2. Be  accurate and careful when reporting your pain level. This will help with your care. 3. Over-reporting your pain level will lead to loss of credibility. 4. Even a level of 1/10 means that there is pain and will be treated at our facility. 5. High, inaccurate reporting will be documented as "Symptom Exaggeration", leading to loss of credibility and suspicions of possible secondary gains such as obtaining more narcotics, or wanting to appear disabled, for fraudulent reasons. 6. Only pain levels of 5 or below will be seen at our facility. 7. Pain levels of 6 and above will be sent to the Emergency Department and the appointment cancelled. ____________________________________________________________________________________________    BMI Assessment: Estimated body mass index is 33.65 kg/m as calculated from the following:   Height as of this encounter: 5\' 2"  (1.575 m).   Weight as of this encounter: 184 lb (83.5 kg).  BMI interpretation table: BMI level Category Range association with higher incidence of chronic pain  <18 kg/m2 Underweight   18.5-24.9 kg/m2 Ideal body weight   25-29.9 kg/m2 Overweight Increased incidence by 20%  30-34.9 kg/m2 Obese (Class I) Increased incidence by 68%  35-39.9 kg/m2 Severe obesity (Class II) Increased incidence by 136%  >40 kg/m2 Extreme obesity (Class III) Increased incidence by 254%   BMI Readings from Last 4 Encounters:  11/15/16 33.65 kg/m  10/25/16 34.57 kg/m  10/15/16 34.75 kg/m  09/19/16 33.65 kg/m   Wt Readings from Last 4 Encounters:  11/15/16 184 lb (83.5 kg)  10/25/16 189 lb (85.7 kg)  10/15/16 190 lb (86.2 kg)  09/19/16 184 lb (83.5 kg)

## 2016-11-15 NOTE — Progress Notes (Deleted)
Patient's Name: Ashley Fields  MRN: [id number]  Referring Provider: Cletis Athens, MD  DOB: 08/03/1955  PCP: Cletis Athens, MD  DOS: 11/15/2016  Note by: Vevelyn Francois NP  Service setting: Ambulatory outpatient  Specialty: Interventional Pain Management  Location: [facility] (AMB) Pain Management Facility    Patient type: Established    Primary Reason(s) for Visit: Encounter for prescription drug management. (Level of risk: moderate)  CC: Hip Pain (bilateral) and Back Pain (low)  HPI  Ashley Fields is a 60 y.o. year old, female patient, who comes today for a medication management evaluation. She has Chronic pain syndrome; Chronic neck pain ; Chronic hip pain (Secondary Area of Pain) (Bilateral) (L>R); Long term current use of opiate analgesic; Long term prescription opiate use; Opiate use; Chronic knee pain (Left); Osteoarthritis of hip (Bilateral); Chronic Low back pain (Primary Area of Pain) (Bilateral)  (R>L); Chronic pain of lower extremity (Tertiary Area of Pain) (Bilateral) (L>R); Long term prescription benzodiazepine use; Grade 1 Anterolisthesis of L3 over L4; Failed back surgical syndrome; Chronic shoulder pain (Left); History of cervical fusion (ACDF C4-C7); DDD (degenerative disc disease), cervical; Chronic sacroiliac joint pain (Bilateral) (R>L); Lumbar Facet Syndrome (Bilateral) (R>L); Lumbar facet osteoarthritis (Bilateral); and DDD (degenerative disc disease), lumbar on their problem list. Her primarily concern today is the Hip Pain (bilateral) and Back Pain (low)  Pain Assessment: Location: Lower, Right, Left Back Radiating: hips, states knees feel weak Onset: More than a month ago Duration: Chronic pain Quality: ("solid pain that just hurts") Severity: 2 /10 (self-reported pain score)  Note: {Blank single:19197::"Reported level is inconsistent with clinical observations.","Clear symptom exaggeration. Reported level of pain is not compatible with clinical observations.","Reported level is  compatible with observation."} {Blank single:19197::"Clinically the patient looks like a","     "} {Blank single:19197::"0/10","1/10","2/10","3/10","4/10","5/10","     "} {Blank single:19197::"A 1/10 is viewed as "Mild" and described as nagging, annoying, but not interfering with basic activities of daily living (ADL). Ashley Fields is able to eat, bathe, get dressed, do toileting (being able to get on and off the toilet and perform personal hygiene functions), transfer (move in and out of bed or a chair without assistance), and maintain continence (able to control bladder and bowel functions). Physiologic parameters such as blood pressure and heart rate apear wnl.","A 2/10 is viewed as "Mild to Moderate" and described as noticeable and distracting. Impossible to hide from other people. More frequent flare-ups. Still possible to adapt and function close to normal. It can be very annoying and may have occasional stronger flare-ups. With discipline, patients may get used to it and adapt.","A 3/10 is viewed as "Moderate" and described as significantly interfering with activities of daily living (ADL). It becomes difficult to feed, bathe, get dressed, get on and off the toilet or to perform personal hygiene functions. Difficult to get in and out of bed or a chair without assistance. Very distracting. With effort, it can be ignored when deeply involved in activities.","A 4/10 is viewed as "Moderately Severe" and described as impossible to ignore for more than a few minutes. With effort, patients may still be able to manage work or participate in some social activities. Very difficult to concentrate. Signs of autonomic nervous system discharge are evident: dilated pupils (mydriasis); mild sweating (diaphoresis); sleep interference. Heart rate becomes elevated (>115 bpm). Diastolic blood pressure (lower number) rises above 100 mmHg. Patients find relief in laying down and not moving.","A 5/10 is viewed as "Severe" and  described as intense and extremely unpleasant. Associated with  frowning face and frequent crying. Pain overwhelms the senses.  Ability to do any activity or maintain social relationships becomes significantly limited. Conversation becomes difficult. Pacing back and forth is common, as getting into a comfortable position is nearly impossible. Pain wakes you up from deep sleep. Physical signs will be obvious: pupillary dilation; increased sweating; goosebumps; brisk reflexes; cold, clammy hands and feet; nausea, vomiting or dry heaves; loss of appetite; significant sleep disturbance with inability to fall asleep or to remain asleep. When persistent, significant weight loss is observed due to the complete loss of appetite and sleep deprivation.  Blood pressure and heart rate becomes significantly elevated.","     "} {Blank single:19197::"Information on the proper use of the pain scale provided to the patient today.","Exaggerated score may be due to the reporting of a "suffering" component.","Score may indicate symptom exaggeration.","Ashley Fields does not seem to understand the use of our objective pain scale","     "} {Blank single:19197::"     ","When using our objective Pain Scale, levels between 6 and 10/10 are said to belong in an emergency room, as it progressively worsens from a 6/10, described as severely limiting, requiring emergency care not usually available at an outpatient pain management facility. At a 6/10 level, communication becomes difficult and requires great effort. Assistance to reach the emergency department may be required. Facial flushing and profuse sweating along with potentially dangerous increases in heart rate and blood pressure will be evident."} Effect on ADL:   Timing: Intermittent Modifying factors: medications, positioning, procedures, heat occassionally  Ashley Fields was last scheduled for an appointment on 08/14/2016 for medication management. During today's appointment we reviewed  Ashley Fields's chronic pain status, as well as her outpatient medication regimen.  The patient  reports that she does not use drugs. Her body mass index is 33.65 kg/m.  Further details on both, my assessment(s), as well as the proposed treatment plan, please see below.  Controlled Substance Pharmacotherapy Assessment REMS (Risk Evaluation and Mitigation Strategy)  Analgesic: *** MME/day: *** mg/day.  Hart Rochester, RN  11/15/2016  9:08 AM  Sign at close encounter Nursing Pain Medication Assessment:  Safety precautions to be maintained throughout the outpatient stay will include: orient to surroundings, keep bed in low position, maintain call bell within reach at all times, provide assistance with transfer out of bed and ambulation.  Medication Inspection Compliance: Ashley Fields did not comply with our request to bring her pills to be counted. She was reminded that bringing the medication bottles, even when empty, is a requirement.  Medication: None brought in. Pill/Patch Count: None available to be counted. Bottle Appearance: No container available. Did not bring bottle(s) to appointment. Filled Date: N/A Last Medication intake:  Today Pharmacokinetics: Liberation and absorption (onset of action): {Blank single:19197::"N/A","Shorter than expected.","Longer than expected","WNL"} Distribution (time to peak effect): {Blank single:19197::"N/A","Shorter than expected.","Longer than expected","WNL"} Metabolism and excretion (duration of action): {Blank single:19197::"N/A","Shorter than expected.","Longer than expected.","WNL"} {Blank single:19197::"This would suggest rapid metabolism and/or elimination","This would suggest impaired metabolism and/or elimination","WNL","       "} Pharmacodynamics: Desired effects: Analgesia: Ashley Fields reports {Blank single:19197::"no","<50%","50%",">50%"} benefit. Functional ability: Patient reports {Blank single:19197::"N/A","being unable to accomplish basic  ADLs","that medication does help, but not nearly as much as she would like","that medication allows her to have a normal productive life, including accomplishing all ADLs","that medication allows her to accomplish basic ADLs"} Clinically meaningful improvement in function (CMIF): {Blank single:19197::"N/A","Medication does not meet basic CMIF","Sustained CMIF goals met"} Perceived effectiveness: {Blank single:19197::"N/A","None","Described as  ineffective and would like to make some changes","Described as relatively effective but with some room for improvement","Described as relatively effective, allowing for increase in activities of daily living (ADL)"} Undesirable effects: Side-effects or Adverse reactions: {Blank single:19197::"N/A","Minimal","Moderate","Significant","Constipation","Oversedation. This could suggest a drug interaction or accumulation","None reported"} Monitoring: [place] PMP: {Blank single:19197::"Unable to conduct review of the controlled substance reporting system due to technological failure.","Online review of the past 32-monthperiod conducted.","Online review of the past 136-montheriod conducted."} {Blank siJOINOM:76720::"NOBpplicable","Non-compliant","Extensive, fairly regular use of opioid analgesics identified","Unreported sources of opioid analgesics detected","Abnormal. Results discussed with patient","Discrepancies found between information provided by the patient and the database","Compliant with practice rules and regulations"} Last UDS on record: Summary  Date Value Ref Range Status  08/14/2016 FINAL  Final    Comment:    ==================================================================== TOXAS[facility]RE COMP DRUG ANALYSIS,UR ==================================================================== Test                             Result       Flag       Units Drug Present and Declared for Prescription Verification   Lorazepam                      2290         EXPECTED   ng/mg  creat    Source of lorazepam is a scheduled prescription medication.   Oxymorphone                    683          EXPECTED   ng/mg creat   Noroxycodone                   3024         EXPECTED   ng/mg creat   Noroxymorphone                 1675         EXPECTED   ng/mg creat    Oxymorphone, noroxycodone and noroxymorphone are expected    metabolites of oxycodone. Noroxymorphone is an expected    metabolite of oxymorphone. Sources of oxycodone and/or    oxymorphone include scheduled prescription medications.   Gabapentin                     PRESENT      EXPECTED   Sertraline                     PRESENT      EXPECTED   Desmethylsertraline            PRESENT      EXPECTED    Desmethylsertraline is an expected metabolite of sertraline.   Acetaminophen                  PRESENT      EXPECTED Drug Present not Declared for Prescription Verification   Trazodone                      PRESENT      UNEXPECTED   1,3 chlorophenyl piperazine    PRESENT      UNEXPECTED    1,3-chlorophenyl piperazine is an expected metabolite of    trazodone. Drug Absent but Declared for Prescription Verification   Oxycodone  Not Detected UNEXPECTED ng/mg creat    Oxycodone is almost always present in patients taking this drug    consistently.  Absence of oxycodone could be due to lapse of time    since the last dose or unusual pharmacokinetics (rapid    metabolism).   Butalbital                     Not Detected UNEXPECTED   Tizanidine                     Not Detected UNEXPECTED    Tizanidine, as indicated in the declared medication list, is not    always detected even when used as directed. ==================================================================== Test                      Result    Flag   Units      Ref Range   Creatinine              84               mg/dL      >=20 ==================================================================== Declared Medications:  The flagging and  interpretation on this report are based on the  following declared medications.  Unexpected results may arise from  inaccuracies in the declared medications.  **Note: The testing scope of this panel includes these medications:  Butalbital (Fioricet)  Gabapentin (Neurontin)  Lorazepam  Oxycodone (Oxycodone Acetaminophen)  Sertraline  **Note: The testing scope of this panel does not include small to  moderate amounts of these reported medications:  Acetaminophen (Fioricet)  Acetaminophen (Oxycodone Acetaminophen)  Tizanidine  **Note: The testing scope of this panel does not include following  reported medications:  Brexpiprazole (Rexulti)  Fluticasone (Flonase)  Levothyroxine  Metronidazole (Flagyl)  Omeprazole  Simvastatin ==================================================================== For clinical consultation, please call [phone]. ====================================================================    UDS interpretation: {Blank SWNIOE:70350::"[id number]","IRC applicable","No unexpected findings","Unexpected findings not considered significantly abnormal","Unexpected findings:","Compliant"} {Blank single:19197::"Today I reminded Ashley Fields that accuracy during medication reconciliation is of paramount importance.","Undeclared illicit substance detected","The patient was given a final warning about the accuracy of reporting medications.","In this case, absence of medication may be secondary to sample timing with relation to PRN intake.","Presence of the parent compound in the absence of its metabolites could be due to very rare metabolic variances, vs deliberate urine sample tampering.","Absence of the parent compound in the presence of its metabolites could be due to lapse of time since the last dose or unusual pharmacokinetics (Rapid Metabolism).","Patient reminded of the CDC guidelines recommending to stay away from the sedatives & benzodiazepines due to the risk of  respiratory depression and death.","Patient informed of the CDC guidelines and recommendations to stay away from the concomitant use of benzodiazepines and opioids due to the increased risk of respiratory depression and death.","This may be an issue with the scheduling as opposed to taking more medication than prescribed. We will make an effort to schedule the return appointment before the patient runs out of medication.","        "} Medication Assessment Form: {Blank VELFYB:01751::"[facility] applicable. Initial evaluation. The patient has not received any medications from our practice","Discrepancies found between patient's report and information collected","Reviewed. Abnormalities discussed","Reviewed. Patient indicates being compliant with therapy"} Treatment compliance: {Blank ENIDPO:24235::"[facility] applicable. Initial evaluation","Non-compliant","Deficiencies noted and steps taken to remind the patient of the seriousness of adequate therapy compliance","Non-compliant. Steps taken to remind the patient of the seriousness of adequate therapy compliance","Recurrent non-compliance, despite repeated warnings","Compliant"} Risk  Assessment Profile: Aberrant behavior: {Aberrant Behavior:210120800::"See prior evaluations. None observed or detected today"} Comorbid factors increasing risk of overdose: {Risk Factors:210120801::"See prior notes. No additional risks detected today"} Risk of substance use disorder (SUD): {Blank single:19197::"Very High","High-to-Very High","High","Moderate-to-High","Moderate","Low-to-Moderate","Low"} Opioid Risk Tool - 09/19/16 0820      Family History of Substance Abuse   Alcohol  Negative    Illegal Drugs  Negative    Rx Drugs  Negative      Personal History of Substance Abuse   Alcohol  Negative    Illegal Drugs  Negative    Rx Drugs  Negative      Age   Age between 25-45 years   No      History of Preadolescent Sexual Abuse   History of Preadolescent Sexual Abuse  Negative  or Female      Psychological Disease   Psychological Disease  Negative    Depression  Positive takes med   takes med     Total Score   Opioid Risk Tool Scoring  1    Opioid Risk Interpretation  Low Risk      ORT Scoring interpretation table:  Score <3 = Low Risk for SUD  Score between 4-7 = Moderate Risk for SUD  Score >8 = High Risk for Opioid Abuse   Risk Mitigation Strategies:  Patient Counseling: {Blank single:19197::"Completed today. Counseling provided to patient as per "Patient Counseling Document". Document signed by patient, attesting to counseling and understanding","Covered"} Patient-Prescriber Agreement (PPA): {Blank single:19197::"Medication agreement broken by patient","Obtained today","Present and active"}  Notification to other healthcare providers: {Blank single:19197::"N/A. Opioid therapy discontinued","Written and sent today","Done"}  Pharmacologic Plan: {Blank single:19197::"Pending ordered tests and/or consults","Today we will take over the chronic pain medication management and from this point on our medication agreement with this patient is active","Therapy adjustment:",""Drug Holiday"","Opioid therapy discontinued","Treatment plan will be modified to exclude opioids","For safety reasons, the treatment plan will be modified to exclude opioids","Ashley Fields is not a candidate for opioid therapy at this time","No change in therapy, at this time"}  Laboratory Chemistry  Inflammation Markers (CRP: Acute Phase) (ESR: Chronic Phase) Lab Results  Component Value Date   CRP 0.7 08/14/2016   ESRSEDRATE 9 08/14/2016  {Blank single:19197::"Interpretation of abnormal results:","No significant anomalies detected.","            "}   Renal Function Markers Lab Results  Component Value Date   BUN 17 08/14/2016   CREATININE 0.80 08/14/2016   GFRAA 93 08/14/2016   GFRNONAA 80 08/14/2016  {Blank single:19197::"UCr/SCr Ratio: ","BUN/Cr Ratio: ","Interpretation of abnormal  results:","No significant anomalies detected.","            "}   Hepatic Function Markers Lab Results  Component Value Date   AST 20 08/14/2016   ALT 10 08/14/2016   ALBUMIN 4.9 (H) 08/14/2016   ALKPHOS 103 08/14/2016  {Blank single:19197::"Interpretation of abnormal results:","No significant anomalies detected.","            "}   Electrolytes Lab Results  Component Value Date   NA 139 08/14/2016   K 4.1 08/14/2016   CL 97 08/14/2016   CALCIUM 9.6 08/14/2016   MG 2.3 08/14/2016  {Blank single:19197::"Interpretation of abnormal results:","No significant anomalies detected.","            "}   Neuropathy Markers Lab Results  Component Value Date   VITAMINB12 473 08/14/2016  {Blank single:19197::"Interpretation of abnormal results:","No significant anomalies detected.","            "}   Bone  Pathology Markers Lab Results  Component Value Date   ALKPHOS 103 08/14/2016   25OHVITD1 47 08/14/2016   25OHVITD2 <1.0 08/14/2016   25OHVITD3 47 08/14/2016   CALCIUM 9.6 08/14/2016  {Blank single:19197::"Interpretation of abnormal results:","No significant anomalies detected.","            "}   Rheumatology Markers No results found for: LABURIC{Blank single:19197::"Interpretation of abnormal results:","No significant anomalies detected.","            "}  Coagulation Parameters No results found for: INR, LABPROT, APTT, PLT{Blank single:19197::"Interpretation of abnormal results:","No significant anomalies detected.","            "}   Cardiovascular Markers No results found for: BNP, CKMB, TROPONINI, HGB, HCT{Blank single:19197::"Interpretation of abnormal results:","No significant anomalies detected.","            "}   CA Markers No results found for: CEA, CA125{Blank single:19197::"Interpretation of abnormal results:","No significant anomalies detected.","            "}   Note: {Blank single:19197::"No results found under the [facility] electronic medical record","Results made  available to patient.","Lab results reviewed and made available to patient.","Lab results reviewed and explained to patient in Layman's terms.","Lab results reviewed."}  Recent Diagnostic Imaging Results  DG C-Arm 1-60 Min-No Report Fluoroscopy was utilized by the requesting physician.  No radiographic  interpretation.   Complexity Note: {Blank single:19197::"No new results found.","No results found under the [facility] record.","Imaging results reviewed.","Imaging results reviewed. Results shared with Ashley Fields, using Layman's terms."} {Blank single:19197::"Today I personally and independently reviewed the study images pertinent to Ashley Fields's problem.","      "} {Blank single:19197::"Results visible under [facility].","Results visible under [facility].","Results visible under [facility].","Results visible under [facility].","Results visible under "Care Everywhere".","Results made available to patient.","Copy of results provided to patient.","     "} {Blank single:19197::"Images reviewed using the PACS system hyperlink.","    "} {Blank single:19197::"I have personally examined the images and I agree with the reported  findings. I find no additional pain-related pathology to add to the report.","I have personally examined the images. I agree with the Radiologist's report. The following observed findings are of concern to me:","    "}  Meds   Current Outpatient Medications:  .  Biotin 10000 MCG TABS, Take 10,000 mcg by mouth 1 day or 1 dose., Disp: , Rfl:  .  butalbital-acetaminophen-caffeine (FIORICET, ESGIC) 50-325-40 MG tablet, TAKE 1 TABLET BY MOUTH EVERY NIGHT AS NEEDED FOR HEADACHE, Disp: , Rfl: 2 .  ferrous [id number] C-folic acid (TRINSICON / FOLTRIN) capsule, Take 1 capsule by mouth 3 (three) times daily after meals., Disp: , Rfl:  .  fluticasone (FLONASE) 50 MCG/ACT nasal spray, 1 SPRAY IN EACH NOSTRIL ONCE A DAY NASALLY 30 DAYS, Disp: , Rfl: 3 .  gabapentin  (NEURONTIN) 300 MG capsule, TAKE ONE CAPSULE BY MOUTH THREE times a day, Disp: , Rfl: 1 .  LORazepam (ATIVAN) 0.5 MG tablet, Take 0.5 mg by mouth 2 (two) times daily as needed., Disp: , Rfl: 0 .  Melatonin 10 MG CAPS, Take 1 capsule by mouth at bedtime., Disp: , Rfl:  .  Multiple Vitamin (MULTIVITAMIN WITH MINERALS) TABS tablet, Take 1 tablet by mouth daily., Disp: , Rfl:  .  omeprazole (PRILOSEC) 40 MG capsule, Take 40 mg by mouth daily., Disp: , Rfl: 0 .  sertraline (ZOLOFT) 100 MG tablet, Take 200 mg by mouth 2 (two) times daily. , Disp: , Rfl: 0 .  simvastatin (ZOCOR) 20 MG tablet, TAKE 1 TABLET IN THE EVENING ONCE A DAY ORALLY 90 DAYS, Disp: , Rfl: 0 .  tiZANidine (ZANAFLEX) 4 MG tablet, Take 1 tablet (4 mg total) by mouth 3 (three) times daily., Disp: 90 tablet, Rfl: 0 .  traMADol (ULTRAM) 50 MG tablet, Take 1 tablet (50 mg total) by mouth every 6 (six) hours as needed for severe pain., Disp: 120 tablet, Rfl: 0 .  traZODone (DESYREL) 150 MG tablet, Take 150 mg by mouth at bedtime., Disp: , Rfl: 0 .  Turmeric 500 MG CAPS, Take 2 capsules by mouth daily., Disp: , Rfl:  .  vitamin C (ASCORBIC ACID) 500 MG tablet, Take 500 mg 2 (two) times daily by mouth., Disp: , Rfl:   ROS  Constitutional: {Blank single:19197::"Denies any fever or chills"} Gastrointestinal: {Blank single:19197::"No reported hemesis, hematochezia, vomiting, or acute GI distress"} Musculoskeletal: {Blank single:19197::"Denies any acute onset joint swelling, redness, loss of ROM, or weakness"} Neurological: {Blank single:19197::"No reported episodes of acute onset apraxia, aphasia, dysarthria, agnosia, amnesia, paralysis, loss of coordination, or loss of consciousness"}  Allergies  Ashley Fields is allergic to flagyl [metronidazole].  PFSH  Drug: Ashley Fields  reports that she does not use drugs. Alcohol:  reports that she does not drink alcohol. Tobacco:  reports that she has quit smoking. She quit after 4.00 years of use. she  has never used smokeless tobacco. Medical:  has a past medical history of Allergy, Hyperlipidemia, and Thyroid disease. Surgical: Ashley Fields  has a past surgical history that includes Cesarean section; Abdominal hysterectomy; Breast reduction surgery; Cholecystectomy; Appendectomy; Gastric bypass (2005); spinal stimulator removed 2012 (2012); acrominoplasty (2015); mini tummy tuck; breast lift bilateral, implants; Lumbar laminectomy (2008); 5 miscarriages; and neck surgery C3-C7. Family: family history includes COPD in her mother; Cancer in her mother; Heart disease in her father.  Constitutional Exam  General appearance: {general exam:210120802::"Well nourished, well developed, and well hydrated. In no apparent acute distress"} Vitals:   11/15/16 0911  BP: (!) 160/72  Pulse: 74  Resp: 18  Temp: 98.6 F (37 C)  TempSrc: Oral  SpO2: 99%  Weight: 184 lb (83.5 kg)  Height: 5' 2"  (1.575 m)   BMI Assessment: Estimated body mass index is 33.65 kg/m as calculated from the following:   Height as of this encounter: 5' 2"  (1.575 m).   Weight as of this encounter: 184 lb (83.5 kg).  BMI interpretation table: BMI level Category Range association with higher incidence of chronic pain  <18 kg/m2 Underweight   18.5-24.9 kg/m2 Ideal body weight   25-29.9 kg/m2 Overweight Increased incidence by 20%  30-34.9 kg/m2 Obese (Class I) Increased incidence by 68%  35-39.9 kg/m2 Severe obesity (Class II) Increased incidence by 136%  >40 kg/m2 Extreme obesity (Class III) Increased incidence by 254%   BMI Readings from Last 4 Encounters:  11/15/16 33.65 kg/m  10/25/16 34.57 kg/m  10/15/16 34.75 kg/m  09/19/16 33.65 kg/m   Wt Readings from Last 4 Encounters:  11/15/16 184 lb (83.5 kg)  10/25/16 189 lb (85.7 kg)  10/15/16 190 lb (86.2 kg)  09/19/16 184 lb (83.5 kg)  Psych/Mental status: {Blank single:19197::"Alert and oriented x 3. 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","Midriatic","Anisocoric","Evidence of ptosis","Pin-point pupils","PERLA"} Respiratory: {Blank single:19197::"Oxygen-dependent COPD","No evidence of acute respiratory distress"}  Cervical Spine  Area Exam  Skin & Axial Inspection: {Blank single:19197::"Well healed scar from previous spine surgery detected","Paravertebral muscle atrophy","No masses, redness, edema, swelling, or associated skin lesions"} Alignment: {Blank single:19197::"Asymmetric","Symmetrical"} Functional ROM: {Blank single:19197::"Improved after treatment","Adequate ROM","Decreased ROM","Diminished ROM","Full ROM","Fused","Grossly intact ROM","Guarding","Limited ROM","Mechanically restricted ROM","Minimal ROM","Pain restricted ROM","Restricted ROM","Zero ROM","ROM appears unrestricted","ROM is within functional limits (WFL)","ROM is within normal limits (WNL)","Unrestricted ROM"}{Blank single:19197::", to the right",", to the left",", bilaterally","     "} Stability: {Blank single:19197::"Possibly unstable","No instability detected"} Muscle Tone/Strength: {Blank single:19197::"Inconsistent level of performance when tested","Guarding observed","Functionally intact. No obvious neuro-muscular anomalies detected."} Sensory (Neurological): {Blank single:19197::"Improved","Movement-associated pain","Movement-associated discomfort","Impaired sensorium","Articular pain pattern","Arthropathic arthralgia","Dermatomal pain pattern","Musculoskeletal pain pattern","Myotome pain pattern","Neurogenic pain pattern","Neuropathic pain pattern","Referred pain pattern","Visceral pain pattern","Allodynia (Painful response to non-painful stimuli)","Anesthesia (Absence of sensation)","Anesthesia Dolorosa (Numbness over painful  area)","Dysesthesias (Unpleasant sensation to touch)","Hyperalgesia (Increased sensitivity to pain)","Hyperesthesia (Increased sensitivity to touch)","Hyperpathia (Painful, exaggerated response to nociceptive stimuli)","Hypoalgesia (Decreased sensitivity to painful stimuli)","Hypoesthesia/Hypesthesia (Reduced sensation to touch)","Paresthesia (Burning sensation)","Paresthesia (Tingling sensation)","WNL","No anomaly detected","Unimpaired"} Palpation: {Blank single:19197::"Tender","Complains of area being tender to palpation","Uncomfortable","Positive","Negative","Increased muscle tone","Trigger Point","Muscular Atrophy","Non-tender","No complaints of tenderness","Hyperthermic","Hypothermic","Euthermic","No palpable anomalies"} {Blank single:19197::"Positive provocative maneuver for","Negative provocative maneuver for","Negative cervical compression test","Cervical compression test positive","Negative Tinel's test","Tinel's test","Negative Trousseau's test","Trousseau's test (3 minute blood pressure cuff test) for hypocalcemia","     "} {Blank single:19197::"for right occipital neuralgia","for left occipital neuralgia","for bilateral occipital neuralgia","for cervical facet disease","     "}  Upper Extremity (UE) Exam    Side: Right upper extremity  Side: Left upper extremity  Skin & Extremity Inspection: {Blank single:19197::"Evidence of prior arthroplastic surgery","Below elbow amputation (BEA)","Above elbow amputation (AEA)","Contracture","Atrophy","Dystrophy","Degenerative arthropathy with ulnar deviation","Degenerative deforming arthropathy","Heberden's nodes (DIP)","Bouchard's nodes (PIP)","No gross anomalies detected","Edema","Positive color changes","Some redness observed","Increased temperature","Acrocyanosis","Skin color, temperature, and hair growth are WNL. No peripheral edema or cyanosis. No masses, redness, swelling, asymmetry, or associated skin lesions. No contractures."}  Skin & Extremity  Inspection: {Blank single:19197::"Evidence of prior arthroplastic surgery","Below elbow amputation (BEA)","Above elbow amputation (AEA)","Contracture","Atrophy","Dystrophy","Degenerative arthropathy with ulnar deviation","Degenerative deforming arthropathy","Heberden's nodes (DIP)","Bouchard's nodes (PIP)","No gross anomalies detected","Edema","Positive color changes","Some redness observed","Increased temperature","Acrocyanosis","Skin color, temperature, and hair growth are WNL. No peripheral edema or cyanosis. No masses, redness, swelling, asymmetry, or associated skin lesions. No contractures."}  Functional ROM: {Blank single:19197::"Improved after treatment","Impaired ROM","Adequate ROM","Decreased ROM","Diminished ROM","Full ROM","Fused","Grossly intact ROM","Guarding","Limited ROM","Mechanically restricted ROM","Minimal ROM","Pain restricted ROM","Restricted ROM","Zero ROM","ROM appears unrestricted","ROM is within functional limits (WFL)","ROM is within normal limits (WNL)","Unrestricted ROM"} {Blank single:19197::"for shoulder","for elbow","for shoulder and elbow","for wrist","for wrist and hand","for hand","for all joints of upper extremity","       "}  Functional ROM: {Blank single:19197::"Improved after treatment","Impaired ROM","Adequate ROM","Decreased ROM","Diminished ROM","Full ROM","Fused","Grossly intact ROM","Guarding","Limited ROM","Mechanically restricted ROM","Minimal ROM","Pain restricted ROM","Restricted ROM","Zero ROM","ROM appears unrestricted","ROM is within functional limits (WFL)","ROM is within normal limits (WNL)","Unrestricted ROM"} {Blank single:19197::"for shoulder","for elbow","for shoulder and elbow","for wrist","for wrist and hand","for hand","for all joints of upper extremity","       "}  Muscle Tone/Strength: {Blank single:19197::"Inconsistent level of performance when tested","Generalized upper extremity weakness","Normal strength (5/5)","Movement possible against some  resistance (4/5)","Movement possible against gravity, but not against resistance (3/5)","Movement possible, but not against gravity (7/0)","YFVCBS flickering, but no movement (1/5)","No motor contraction (0/5)","Flaccid paralysis","Spastic paralysis","Cogwheel rigidity","Clasp-knife rigidity","Give-away weakness","Deconditioned","Mild-to-medarate deconditioning","Moderate-to-severe deconditioning","Guarding","WNL","Unremarkable","Grossly normal","Grossly intact","Functionally intact. No obvious neuro-muscular anomalies detected."}  Muscle Tone/Strength: {Blank single:19197::"Inconsistent level of performance when tested","Generalized upper extremity weakness","Normal strength (5/5)","Movement possible against some resistance (4/5)","Movement possible against gravity, but not against resistance (3/5)","Movement possible, but not against gravity (4/9)","QPRFFM flickering, but no movement (1/5)","No motor contraction (0/5)","Flaccid paralysis","Spastic paralysis","Cogwheel rigidity","Clasp-knife rigidity","Give-away weakness","Deconditioned","Mild-to-medarate deconditioning","Moderate-to-severe deconditioning","Guarding","WNL","Unremarkable","Grossly normal","Grossly intact","Functionally intact.  No obvious neuro-muscular anomalies detected."}  Sensory (Neurological): {Blank single:19197::"Improved","Movement-associated pain","Movement-associated discomfort","Impaired sensorium","Articular pain pattern","Arthropathic arthralgia","Dermatomal pain pattern","Non-dermatomal pain pattern","Musculoskeletal pain pattern","Myotome pain pattern","Neurogenic pain pattern","Neuropathic pain pattern","Referred pain pattern","Visceral pain pattern","Allodynia (Painful response to non-painful stimuli)","Anesthesia (Absence of sensation)","Anesthesia Dolorosa (Numbness over painful area)","Dysesthesias (Unpleasant sensation to touch)","Hyperalgesia (Increased sensitivity to pain)","Hyperesthesia (Increased sensitivity to  touch)","Hyperpathia (Painful, exaggerated response to nociceptive stimuli)","Hypoalgesia (Decreased sensitivity to painful stimuli)","Hypoesthesia/Hypesthesia (Reduced sensation to touch)","Paresthesia (Burning sensation)","Paresthesia (Tingling sensation)","WNL","No anomaly detected","Unimpaired"} {Blank single:19197::"affecting the shoulder","affecting the elbow","affecting the shoulder and elbow","affecting the wrist","affecting the wrist and hand","affecting the hand","affecting all joints of upper extremity","       "}  Sensory (Neurological): {Blank single:19197::"Improved","Movement-associated pain","Movement-associated discomfort","Impaired sensorium","Articular pain pattern","Arthropathic arthralgia","Dermatomal pain pattern","Non-dermatomal pain pattern","Musculoskeletal pain pattern","Myotome pain pattern","Neurogenic pain pattern","Neuropathic pain pattern","Referred pain pattern","Visceral pain pattern","Allodynia (Painful response to non-painful stimuli)","Anesthesia (Absence of sensation)","Anesthesia Dolorosa (Numbness over painful area)","Dysesthesias (Unpleasant sensation to touch)","Hyperalgesia (Increased sensitivity to pain)","Hyperesthesia (Increased sensitivity to touch)","Hyperpathia (Painful, exaggerated response to nociceptive stimuli)","Hypoalgesia (Decreased sensitivity to painful stimuli)","Hypoesthesia/Hypesthesia (Reduced sensation to touch)","Paresthesia (Burning sensation)","Paresthesia (Tingling sensation)","WNL","No anomaly detected","Unimpaired"} {Blank single:19197::"affecting the shoulder","affecting the elbow","affecting the shoulder and elbow","affecting the wrist","affecting the wrist and hand","affecting the hand","affecting all joints of upper extremity","       "}  Palpation: {Blank single:19197::"Tender","Complains of area being tender to palpation","Uncomfortable","Positive","Negative","Increased muscle tone","Trigger Point","Muscular Atrophy","Non-tender","No  complaints of tenderness","Hyperthermic","Hypothermic","Euthermic","No palpable anomalies"} {Blank single:19197::"Tinel's test","Trousseau's test (3 minute blood pressure cuff test) for hypocalcemia","     "} {Blank single:19197::"for right CTS","for left CTS","for bilateral CTS","     "}  Palpation: {Blank single:19197::"Tender","Complains of area being tender to palpation","Uncomfortable","Positive","Negative","Increased muscle tone","Trigger Point","Muscular Atrophy","Non-tender","No complaints of tenderness","Hyperthermic","Hypothermic","Euthermic","No palpable anomalies"} {Blank single:19197::"Tinel's test","Trousseau's test (3 minute blood pressure cuff test) for hypocalcemia","     "} {Blank single:19197::"for right CTS","for left CTS","for bilateral CTS","     "}  Specialized Test(s): {Blank single:19197::"Tinel's","Phalen's","Tinel's/Phalen's","Deferred"} {Blank single:19197::"(+)","(-)","(+)/(-)","(-)/(+)","      "}  Specialized Test(s): {Blank single:19197::"Tinel's","Phalen's","Tinel's/Phalen's","Deferred"} {Blank single:19197::"(+)","(-)","(+)/(-)","(-)/(+)","      "}   Thoracic Spine Area Exam  Skin & Axial Inspection: {Blank single:19197::"Well healed scar from previous spine surgery detected","prominent thoracic Kyphosis","Significant thoracic kyphosis","Paravertebral muscle atrophy","No masses, redness, or swelling"} Alignment: {Blank single:19197::"Asymmetric","Symmetrical"} Functional ROM: {Blank single:19197::"Improved after treatment","Adequate ROM","Decreased ROM","Diminished ROM","Full ROM","Fused","Grossly intact ROM","Guarding","Limited ROM","Mechanically restricted ROM","Minimal ROM","Pain restricted ROM","Restricted ROM","Zero ROM","ROM appears unrestricted","ROM is within functional limits (WFL)","ROM is within normal limits (WNL)","Unrestricted ROM"} Stability: {Blank single:19197::"Possibly unstable","No instability detected"} Muscle Tone/Strength: {Blank  single:19197::"Increased muscle tone over affected area","Inconsistent level of performance when tested","Functionally intact. No obvious neuro-muscular anomalies detected."} Sensory (Neurological): {Blank single:19197::"Improved","Movement associated pain","Movement associated discomfort","Impaired sensorium","Articular pain pattern","Dermatomal pain pattern","Musculoskeletal pain pattern","Myotome pain pattern","Neurogenic pain pattern","Neuropathic pain pattern","Referred pain pattern","Visceral pain pattern","Allodynia (Painful response to non-painful stimuli)","Anesthesia (Absence of sensation)","Anesthesia Dolorosa (Numbness over painful area)","Dysesthesias (Unpleasant sensation to touch)","Hyperalgesia (Increased sensitivity to pain)","Hyperesthesia (Increased sensitivity to touch)","Hyperpathia (Painful, exaggerated response to nociceptive stimuli)","Hypoalgesia (Decreased sensitivity to painful stimuli)","Hypoesthesia/Hypesthesia (Reduced sensation to touch)","Paresthesia (Burning sensation)","Paresthesia (Tingling sensation)","WNL","No anomaly detected","Unimpaired"} Muscle strength & Tone: {Blank single:19197::"Tender","Complains of area being tender to palpation","Uncomfortable","Positive","Negative","Increased muscle tone","Trigger Point","Muscular Atrophy","Non-tender","No complaints of tenderness","Hyperthermic","Hypothermic","Euthermic","No palpable anomalies"}  Lumbar Spine Area Exam  Skin & Axial Inspection: {Blank single:19197::"Well healed scar from previous spine surgery detected","Thoraco-lumbar Scoliosis","Lumbar Scoliosis","Paravertebral muscle atrophy","No masses, redness, or swelling"} Alignment: {Blank single:19197::"Scoliosis detected","Levoscoliosis","Dextroscoliosis","Asymmetric","Symmetrical"} Functional ROM: {Blank single:19197::"Improved after treatment","Adequate ROM","Decreased ROM","Diminished ROM","Full ROM","Fused","Grossly intact ROM","Guarding","Limited  ROM","Mechanically restricted ROM","Minimal ROM","Pain restricted ROM","Restricted ROM","Zero ROM","ROM appears unrestricted","ROM is within functional limits (WFL)","ROM is within normal limits (WNL)","Unrestricted ROM"}{Blank single:19197::", to the right",", to the left",", bilaterally","     "}  Stability: {Blank single:19197::"Possibly unstable","No instability detected"} Muscle Tone/Strength: {Blank single:19197::"Increased muscle tone over affected area","Inconsistent level of performance when tested","Functionally intact. No obvious neuro-muscular anomalies detected."} Sensory (Neurological): {Blank single:19197::"Improved","Movement-associated pain","Movement-associated discomfort","Impaired sensorium","Articular pain pattern","Dermatomal pain pattern","Musculoskeletal pain pattern","Myotome pain pattern","Neurogenic pain pattern","Neuropathic pain pattern","Referred pain pattern","Visceral pain pattern","Allodynia (Painful response to non-painful stimuli)","Anesthesia (Absence of sensation)","Anesthesia Dolorosa (Numbness over painful area)","Dysesthesias (Unpleasant sensation to touch)","Hyperalgesia (Increased sensitivity to pain)","Hyperesthesia (Increased sensitivity to touch)","Hyperpathia (Painful, exaggerated response to nociceptive stimuli)","Hypoalgesia (Decreased sensitivity to painful stimuli)","Hypoesthesia/Hypesthesia (Reduced sensation to touch)","Paresthesia (Burning sensation)","Paresthesia (Tingling sensation)","WNL","No anomaly detected","Unimpaired"} Palpation: {Blank single:19197::"Tender","Complains of area being tender to palpation","Uncomfortable","Positive","Negative","Increased muscle tone","Trigger Point","Muscular Atrophy","Non-tender","No complaints of tenderness","Hyperthermic","Hypothermic","Euthermic","No palpable anomalies"} {Blank single:19197::"Right Fist Percussion Test","Left Fist Percussion Test","Bilateral Fist Percussion Test","     "} Provocative Tests: Lumbar  Hyperextension and rotation test: {Blank single:19197::"Positive","Negative","Equivocal","Improved after treatment","Unable to perform","Non-contributory","improved","worsened","no change from prior assessment","evaluation deferred today"} {Blank single:19197::"bilaterally for facet joint pain.","on the right for facet joint pain.","on the left for facet joint pain.","due to pain.","due to fusion restriction.","     "} Lumbar Lateral bending test: {Blank single:19197::"Positive","Negative","Equivocal","Improved after treatment","Unable to perform","Non-contributory","improved","worsened","no change from prior assessment","evaluation deferred today"} {Blank single:19197::"ipsilateral radicular pain, bilaterally. Positive for bilateral foraminal stenosis.","ipsilateral radicular pain, on the right. Positive for right-sided foraminal stenosis.","ipsilateral radicular pain, on the left. Positive for left-sided foraminal stenosis.","due to pain.","due to fusion restriction.","     "} Patrick's Maneuver: {Blank single:19197::"Positive","Negative","Non-diagnostic","Improved after treatment","Unable to perform","worsened","unimproved","Unchanged","no change from prior assessment","evaluation deferred today"} {Blank single:19197::"for bilateral S-I arthralgia","for right-sided S-I arthralgia","for left-sided S-I arthralgia","     "} {Blank single:19197::"and","     "} {Blank single:19197::"for bilateral hip arthralgia","for right hip arthralgia","for left hip arthralgia","due to pain","due to fusion restriction","     "}  Gait & Posture Assessment  Ambulation: {Blank single:19197::"Limited","Patient ambulates using a cane","Patient ambulates using crutches","Patient ambulates using a walker","Patient ambulates using a wheel chair","Patient came in today in a wheel chair","Patient comes in today on a stretcher","Incapable of ambulation without assistance","Nonfunctional","Dependent, Level II (constant assistance  required)","Dependent, Level I (intermittent assistance required)","Dependent, Supervision required","Independent, Level surfaces only","Independent, Level and Non-level surfaces","Unassisted"} Gait: {Blank single:19197::"Age-related, senile gait pattern","Antalgic","Limited. Using assistive device to ambulate","Very limited, using assistive device to ambulate","Antalgic gait (limping)","Apraxia (Inability to execute a movement, upon request, without loss of motor or sensory function)","Ataxia (Poor voluntary coordination)","Ataxia (Appendicular)","Ataxia (Cerebellar)(Irregular, uncoordinated movement with inability to balance on one leg or perform tandem gait test)","Ataxia (Friedreich's)","Ataxia (Sensory) (Unsteady "stomping" gait with heavy heel strikes. Postural instability worsened by closing eyes.)","Ataxia (Truncal)("Drunken sailor" gait)","Ataxia (Vestibular)","Awkward","Cautious","Charlie-Chaplin (due to tibial torsion)","Choreiform (Irregular, jerky, involuntary movements)(Hyperkinetic)","Circumduction gait (due to hemiplegia)","Clumsy","Compensatory","Dystaxia (Mild degree of ataxia)","Dystonic","Frontal gait (Apraxia)","Hemiataxia (Ataxia limited to one side)","Hemiparetic (Post-stroke)(Ipsilateral arm flexion and tiptoe/lateral foot walk)","High stepping gait (foot drop)","Modified gait pattern (slower gait speed, wider stride width, and longer stance duration) associated with morbid obesity","Paraparetic (Stiffness, extension, adduction and scissoring of both legs)","Paraparesis","Paraplegia","Hemiparesis","Hemeplegia","Flaccid paralysis","Parkinsonian","Psychogenic","Scissor gait (cerebral palsy)","Shuffling gait","Staggering","Steppage","Stiff hip gait (hip ankylosis)","Stumbling","Trendelenburg (unstable hip)","Unaffected","Uneven","Waddling (Hip pathology)","Functionally WNL","Grossly intact","Improved after treatment","Relatively normal for age and body habitus"} Posture: {Blank  single:19197::"Antalgic","Difficulty standing up straight, due to pain","Positive Romberg's test (Sensory Ataxia)(Worsening of balance and pointing with eyes closed)","Painful","Recombent","Relaxed","Tense","Difficulty with positional changes","Sway back","Lumbar lordosis","Thoracic kyphosis","Kyphosis-lordosis","Flat back","Forward head","Neutral Spine","Slouching","Drooping","Rigid","Poor","Good","WNL"}   Lower Extremity Exam    Side: Right lower extremity  Side: Left lower extremity  Skin & Extremity Inspection: {Blank single:19197::"Evidence of prior arthroplastic surgery","Below knee amputation (BKA)","Above knee amputation (AKA)","Contracture","Atrophy","Dystrophy","No gross anomalies detected","Edema","Pitting edema","Venous stasis edema","Positive color changes","Some redness observed","Increased temperature","Acrocyanosis","Skin color, temperature, and hair growth are WNL. No peripheral edema or cyanosis. No masses, redness, swelling, asymmetry, or associated skin  lesions. No contractures."}  Skin & Extremity Inspection: {Blank single:19197::"Evidence of prior arthroplastic surgery","Below knee amputation (BKA)","Above knee amputation (AKA)","Contracture","Atrophy","Dystrophy","No gross anomalies detected","Edema","Pitting edema","Venous stasis edema","Positive color changes","Some redness observed","Increased temperature","Acrocyanosis","Skin color, temperature, and hair growth are WNL. No peripheral edema or cyanosis. No masses, redness, swelling, asymmetry, or associated skin lesions. No contractures."}  Functional ROM: {Blank single:19197::"Improved after treatment","Impaired ROM","Adequate ROM","Decreased ROM","Diminished ROM","Full ROM","Fused","Grossly intact ROM","Guarding","Limited ROM","Mechanically restricted ROM","Minimal ROM","Pain restricted ROM","Restricted ROM","Zero ROM","ROM appears unrestricted","ROM is within functional limits (WFL)","ROM is within normal limits (WNL)","Unrestricted  ROM"} {Blank single:19197::"for hip joint","for knee joint","for hip and knee joints","for all joints of the lower extremity","       "}  Functional ROM: {Blank single:19197::"Improved after treatment","Impaired ROM","Adequate ROM","Decreased ROM","Diminished ROM","Full ROM","Fused","Grossly intact ROM","Guarding","Limited ROM","Mechanically restricted ROM","Minimal ROM","Pain restricted ROM","Restricted ROM","Zero ROM","ROM appears unrestricted","ROM is within functional limits (WFL)","ROM is within normal limits (WNL)","Unrestricted ROM"} {Blank single:19197::"for hip joint","for knee joint","for hip and knee joints","for all joints of the lower extremity","       "}  Muscle Tone/Strength: {Blank single:19197::"Able to Toe-walk & Heel-walk without problems","Foot drop","L2 weakness","L3 weakness","L4 weakness","L5 weakness","S1 weakness","Give-away weakness","Deconditioned","Mild-to-moderate deconditioning","Moderate-to-severe deconditioning","Generalized lower extremity weakness","Guarding","Inconsistent level of performance when tested","Normal strength (5/5)","Movement possible against some resistance (4/5)","Movement possible against gravity, but not against resistance (3/5)","Movement possible, but not against gravity (2/7)","CWCBJS flickering, but no movement (1/5)","No motor contraction (0/5)","Paraparesis","Paraplegia","Hemiparesis","Hemeplegia","Flaccid paralysis","Spastic paralysis","Cogwheel rigidity","Clasp-knife rigidity","WNL","Unremarkable","Grossly normal","Grossly intact","Functionally intact. No obvious neuro-muscular anomalies detected."}  Muscle Tone/Strength: {Blank single:19197::"Able to Toe-walk & Heel-walk without problems","Foot drop","L2 weakness","L3 weakness","L4 weakness","L5 weakness","S1 weakness","Give-away weakness","Deconditioned","Mild-to-moderate deconditioning","Moderate-to-severe deconditioning","Generalized lower extremity weakness","Guarding","Inconsistent level of  performance when tested","Normal strength (5/5)","Movement possible against some resistance (4/5)","Movement possible against gravity, but not against resistance (3/5)","Movement possible, but not against gravity (2/8)","BTDVVO flickering, but no movement (1/5)","No motor contraction (0/5)","Paraparesis","Paraplegia","Hemiparesis","Hemeplegia","Flaccid paralysis","Spastic paralysis","Cogwheel rigidity","Clasp-knife rigidity","WNL","Unremarkable","Grossly normal","Grossly intact","Functionally intact. No obvious neuro-muscular anomalies detected."}  Sensory (Neurological): {Blank single:19197::"Improved","Movement-associated pain","Movement-associated discomfort","Impaired sensorium","Articular pain pattern","Arthropathic arthralgia","Dermatomal pain pattern","Non-dermatomal pain pattern","Musculoskeletal pain pattern","Myotome pain pattern","Neurogenic pain pattern","Neuropathic pain pattern","Referred pain pattern","Visceral pain pattern","Allodynia (Painful response to non-painful stimuli)","Anesthesia (Absence of sensation)","Anesthesia Dolorosa (Numbness over painful area)","Dysesthesias (Unpleasant sensation to touch)","Hyperalgesia (Increased sensitivity to pain)","Hyperesthesia (Increased sensitivity to touch)","Hyperpathia (Painful, exaggerated response to nociceptive stimuli)","Hypoalgesia (Decreased sensitivity to painful stimuli)","Hypoesthesia/Hypesthesia (Reduced sensation to touch)","Paresthesia (Burning sensation)","Paresthesia (Tingling sensation)","WNL","No anomaly detected","Unimpaired"}  Sensory (Neurological): {Blank single:19197::"Improved","Movement-associated pain","Movement-associated discomfort","Impaired sensorium","Articular pain pattern","Arthropathic arthralgia","Dermatomal pain pattern","Non-dermatomal pain pattern","Musculoskeletal pain pattern","Myotome pain pattern","Neurogenic pain pattern","Neuropathic pain pattern","Referred pain pattern","Visceral pain pattern","Allodynia  (Painful response to non-painful stimuli)","Anesthesia (Absence of sensation)","Anesthesia Dolorosa (Numbness over painful area)","Dysesthesias (Unpleasant sensation to touch)","Hyperalgesia (Increased sensitivity to pain)","Hyperesthesia (Increased sensitivity to touch)","Hyperpathia (Painful, exaggerated response to nociceptive stimuli)","Hypoalgesia (Decreased sensitivity to painful stimuli)","Hypoesthesia/Hypesthesia (Reduced sensation to touch)","Paresthesia (Burning sensation)","Paresthesia (Tingling sensation)","WNL","No anomaly detected","Unimpaired"}  Palpation: {Blank single:19197::"Tender","Complains of area being tender to palpation","Uncomfortable","Positive","Negative","Increased muscle tone","Trigger Point","Muscular Atrophy","Non-tender","No complaints of tenderness","Hyperthermic","Hypothermic","Euthermic","No palpable anomalies"}  Palpation: {Blank single:19197::"Tender","Complains of area being tender to palpation","Uncomfortable","Positive","Negative","Increased muscle tone","Trigger Point","Muscular Atrophy","Non-tender","No complaints of tenderness","Hyperthermic","Hypothermic","Euthermic","No palpable anomalies"}   Assessment  Primary Diagnosis & Pertinent Problem List: There were no encounter diagnoses.  Status Diagnosis  {Problem Stability:19197::"Improving","Improved","Not improving","Not responding","Persistent","Recurring","Reoccurring","Deteriorating","Having a Flare-up","Responding","Resolved","Stable","Unimproved","Worsening","Worsened","Controlled"} {Problem Stability:19197::"Improving","Improved","Not improving","Not responding","Persistent","Recurring","Reoccurring","Deteriorating","Having a Flare-up","Responding","Resolved","Stable","Unimproved","Worsening","Worsened","Controlled"} {Problem Stability:19197::"Improving","Improved","Not improving","Not responding","Persistent","Recurring","Reoccurring","Deteriorating","Having a  Flare-up","Responding","Resolved","Stable","Unimproved","Worsening","Worsened","Controlled"} No diagnosis found.  Problems updated and reviewed during this visit: No problems updated. Plan of Care  Pharmacotherapy (Medications Ordered): No orders of the defined types were placed in this encounter. This SmartLink is deprecated. Use AVSMEDLIST instead to display the medication list for a patient. Medications administered today: Rudy Domek had no medications administered during this visit. Lab-work, procedure(s), and/or referral(s): No orders of the  defined types were placed in this encounter.  Imaging and/or referral(s): None  Interventional therapies: Planned, scheduled, and/or pending:   {Blank single:19197::"Not indicated","Medically contraindicated","On anticoagulants","To be determined at a later time","Not at this time."}   Considering:   ***   Palliative PRN treatment(s):   ***   Provider-requested follow-up: Return in about 3 months (around 02/15/2017) for MedMgmt.  No future appointments. Primary Care Physician: Cletis Athens, MD Location: [facility] Out[facility] Management Facility Note by: Vevelyn Francois NP Date: 11/15/2016; Time: 9:24 AM  Pain Score Disclaimer: We use the NRS-11 scale. This is a self-reported, subjective measurement of pain severity with only modest accuracy. It is used primarily to identify changes within a particular patient. It must be understood that outpatient pain scales are significantly less accurate that those used for research, where they can be applied under ideal controlled circumstances with minimal exposure to variables. In reality, the score is likely to be a combination of pain intensity and pain affect, where pain affect describes the degree of emotional arousal or changes in action readiness caused by the sensory experience of pain. Factors such as social and work situation, setting, emotional state, anxiety levels, expectation, and prior  pain experience may influence pain perception and show large inter-individual differences that may also be affected by time variables.  Patient instructions provided during this appointment: Patient Instructions   ____________________________________________________________________________________________  Pain Scale  Introduction: The pain score used by this practice is the Verbal Numerical Rating Scale (VNRS-11). This is an 11-point scale. It is for adults and children 10 years or older. There are significant differences in how the pain score is reported, used, and applied. Forget everything you learned in the past and learn this scoring system.  General Information: The scale should reflect your current level of pain. Unless you are specifically asked for the level of your worst pain, or your average pain. If you are asked for one of these two, then it should be understood that it is over the past 24 hours.  Basic Activities of Daily Living (ADL): Personal hygiene, dressing, eating, transferring, and using restroom.  Instructions: Most patients tend to report their level of pain as a combination of two factors, their physical pain and their psychosocial pain. This last one is also known as "suffering" and it is reflection of how physical pain affects you socially and psychologically. From now on, report them separately. From this point on, when asked to report your pain level, report only your physical pain. Use the following table for reference.  Pain Clinic Pain Levels (0-5/10)  Pain Level Score  Description  No Pain 0   Mild pain 1 Nagging, annoying, but does not interfere with basic activities of daily living (ADL). Patients are able to eat, bathe, get dressed, toileting (being able to get on and off the toilet and perform personal hygiene functions), transfer (move in and out of bed or a chair without assistance), and maintain continence (able to control bladder and bowel functions).  Blood pressure and heart rate are unaffected. A normal heart rate for a healthy adult ranges from 60 to 100 bpm (beats per minute).   Mild to moderate pain 2 Noticeable and distracting. Impossible to hide from other people. More frequent flare-ups. Still possible to adapt and function close to normal. It can be very annoying and may have occasional stronger flare-ups. With discipline, patients may get used to it and adapt.   Moderate pain 3 Interferes significantly with activities of daily living (  ADL). It becomes difficult to feed, bathe, get dressed, get on and off the toilet or to perform personal hygiene functions. Difficult to get in and out of bed or a chair without assistance. Very distracting. With effort, it can be ignored when deeply involved in activities.   Moderately severe pain 4 Impossible to ignore for more than a few minutes. With effort, patients may still be able to manage work or participate in some social activities. Very difficult to concentrate. Signs of autonomic nervous system discharge are evident: dilated pupils (mydriasis); mild sweating (diaphoresis); sleep interference. Heart rate becomes elevated (>115 bpm). Diastolic blood pressure (lower number) rises above 100 mmHg. Patients find relief in laying down and not moving.   Severe pain 5 Intense and extremely unpleasant. Associated with frowning face and frequent crying. Pain overwhelms the senses.  Ability to do any activity or maintain social relationships becomes significantly limited. Conversation becomes difficult. Pacing back and forth is common, as getting into a comfortable position is nearly impossible. Pain wakes you up from deep sleep. Physical signs will be obvious: pupillary dilation; increased sweating; goosebumps; brisk reflexes; cold, clammy hands and feet; nausea, vomiting or dry heaves; loss of appetite; significant sleep disturbance with inability to fall asleep or to remain asleep. When persistent,  significant weight loss is observed due to the complete loss of appetite and sleep deprivation.  Blood pressure and heart rate becomes significantly elevated. Caution: If elevated blood pressure triggers a pounding headache associated with blurred vision, then the patient should immediately seek attention at an urgent or emergency care unit, as these may be signs of an impending stroke.    Emergency Department Pain Levels (6-10/10)  Emergency Room Pain 6 Severely limiting. Requires emergency care and should not be seen or managed at an outpatient pain management facility. Communication becomes difficult and requires great effort. Assistance to reach the emergency department may be required. Facial flushing and profuse sweating along with potentially dangerous increases in heart rate and blood pressure will be evident.   Distressing pain 7 Self-care is very difficult. Assistance is required to transport, or use restroom. Assistance to reach the emergency department will be required. Tasks requiring coordination, such as bathing and getting dressed become very difficult.   Disabling pain 8 Self-care is no longer possible. At this level, pain is disabling. The individual is unable to do even the most "basic" activities such as walking, eating, bathing, dressing, transferring to a bed, or toileting. Fine motor skills are lost. It is difficult to think clearly.   Incapacitating pain 9 Pain becomes incapacitating. Thought processing is no longer possible. Difficult to remember your own name. Control of movement and coordination are lost.   The worst pain imaginable 10 At this level, most patients pass out from pain. When this level is reached, collapse of the autonomic nervous system occurs, leading to a sudden drop in blood pressure and heart rate. This in turn results in a temporary and dramatic drop in blood flow to the brain, leading to a loss of consciousness. Fainting is one of the body's self defense  mechanisms. Passing out puts the brain in a calmed state and causes it to shut down for a while, in order to begin the healing process.    Summary: 1. Refer to this scale when providing [place] with your pain level. 2. Be accurate and careful when reporting your pain level. This will help with your care. 3. Over-reporting your pain level will lead to loss of credibility.  4. Even a level of 1/10 means that there is pain and will be treated at our facility. 5. High, inaccurate reporting will be documented as "Symptom Exaggeration", leading to loss of credibility and suspicions of possible secondary gains such as obtaining more narcotics, or wanting to appear disabled, for fraudulent reasons. 6. Only pain levels of 5 or below will be seen at our facility. 7. Pain levels of 6 and above will be sent to the Emergency Department and the appointment cancelled. ____________________________________________________________________________________________

## 2016-11-22 LAB — TOXASSURE SELECT 13 (MW), URINE

## 2016-12-11 DIAGNOSIS — R102 Pelvic and perineal pain: Secondary | ICD-10-CM | POA: Diagnosis not present

## 2016-12-11 DIAGNOSIS — Z136 Encounter for screening for cardiovascular disorders: Secondary | ICD-10-CM | POA: Diagnosis not present

## 2016-12-11 DIAGNOSIS — Z131 Encounter for screening for diabetes mellitus: Secondary | ICD-10-CM | POA: Diagnosis not present

## 2016-12-11 DIAGNOSIS — Z13220 Encounter for screening for lipoid disorders: Secondary | ICD-10-CM | POA: Diagnosis not present

## 2016-12-11 DIAGNOSIS — Z1329 Encounter for screening for other suspected endocrine disorder: Secondary | ICD-10-CM | POA: Diagnosis not present

## 2016-12-11 DIAGNOSIS — Z1321 Encounter for screening for nutritional disorder: Secondary | ICD-10-CM | POA: Diagnosis not present

## 2016-12-13 ENCOUNTER — Encounter: Payer: Self-pay | Admitting: Nurse Practitioner

## 2016-12-13 ENCOUNTER — Other Ambulatory Visit: Payer: Self-pay

## 2016-12-13 ENCOUNTER — Ambulatory Visit: Payer: Medicare HMO | Attending: Nurse Practitioner | Admitting: Nurse Practitioner

## 2016-12-13 VITALS — BP 165/82 | HR 58 | Temp 98.6°F | Resp 16 | Ht 62.0 in | Wt 181.0 lb

## 2016-12-13 DIAGNOSIS — M5441 Lumbago with sciatica, right side: Secondary | ICD-10-CM | POA: Diagnosis not present

## 2016-12-13 DIAGNOSIS — G894 Chronic pain syndrome: Secondary | ICD-10-CM | POA: Diagnosis not present

## 2016-12-13 DIAGNOSIS — G8929 Other chronic pain: Secondary | ICD-10-CM | POA: Diagnosis not present

## 2016-12-13 DIAGNOSIS — M25562 Pain in left knee: Secondary | ICD-10-CM | POA: Insufficient documentation

## 2016-12-13 DIAGNOSIS — M5136 Other intervertebral disc degeneration, lumbar region: Secondary | ICD-10-CM | POA: Insufficient documentation

## 2016-12-13 DIAGNOSIS — E785 Hyperlipidemia, unspecified: Secondary | ICD-10-CM | POA: Diagnosis not present

## 2016-12-13 DIAGNOSIS — M25512 Pain in left shoulder: Secondary | ICD-10-CM | POA: Insufficient documentation

## 2016-12-13 DIAGNOSIS — Z79891 Long term (current) use of opiate analgesic: Secondary | ICD-10-CM | POA: Insufficient documentation

## 2016-12-13 DIAGNOSIS — M25559 Pain in unspecified hip: Secondary | ICD-10-CM

## 2016-12-13 DIAGNOSIS — M47816 Spondylosis without myelopathy or radiculopathy, lumbar region: Secondary | ICD-10-CM | POA: Diagnosis not present

## 2016-12-13 DIAGNOSIS — M542 Cervicalgia: Secondary | ICD-10-CM | POA: Diagnosis not present

## 2016-12-13 DIAGNOSIS — Z87891 Personal history of nicotine dependence: Secondary | ICD-10-CM | POA: Diagnosis not present

## 2016-12-13 DIAGNOSIS — Z79899 Other long term (current) drug therapy: Secondary | ICD-10-CM | POA: Diagnosis not present

## 2016-12-13 DIAGNOSIS — M5442 Lumbago with sciatica, left side: Secondary | ICD-10-CM

## 2016-12-13 DIAGNOSIS — Z5181 Encounter for therapeutic drug level monitoring: Secondary | ICD-10-CM | POA: Insufficient documentation

## 2016-12-13 MED ORDER — TIZANIDINE HCL 4 MG PO TABS: 4.0000 mg | ORAL_TABLET | Freq: Three times a day (TID) | ORAL | 0 refills | Status: DC

## 2016-12-13 MED ORDER — TRAMADOL HCL 50 MG PO TABS: 50.0000 mg | ORAL_TABLET | Freq: Four times a day (QID) | ORAL | 0 refills | Status: DC | PRN

## 2016-12-13 NOTE — Progress Notes (Signed)
Nursing Pain Medication Assessment:  Safety precautions to be maintained throughout the outpatient stay will include: orient to surroundings, keep bed in low position, maintain call bell within reach at all times, provide assistance with transfer out of bed and ambulation.  Medication Inspection Compliance: Pill count conducted under aseptic conditions, in front of the patient. Neither the pills nor the bottle was removed from the patient's sight at any time. Once count was completed pills were immediately returned to the patient in their original bottle.  Medication: Tramadol (Ultram) Pill/Patch Count: 29 of 120 pills remain Pill/Patch Appearance: Markings consistent with prescribed medication Bottle Appearance: Standard pharmacy container. Clearly labeled. Filled Date: 29 / 12/ 2018 Last Medication intake:  Today

## 2016-12-13 NOTE — Progress Notes (Addendum)
Patient's Name: Ashley Fields  MRN: [id number]  Referring Provider: Cletis Athens, MD  DOB: 03/14/1955  PCP: Cletis Athens, MD  DOS: 12/13/2016  Note by: Vevelyn Francois NP  Service setting: Ambulatory outpatient  Specialty: Interventional Pain Management  Location: [facility] (AMB) Pain Management Facility    Patient type: Established    Primary Reason(s) for Visit: Encounter for prescription drug management. (Level of risk: moderate)  CC: Back Pain (lower) and Neck Pain  HPI  Ashley Fields is a 61 y.o. year old, female patient, who comes today for a medication management evaluation. She has Chronic pain syndrome; Chronic neck pain ; Chronic hip pain (Secondary Area of Pain) (Bilateral) (L>R); Long term current use of opiate analgesic; Long term prescription opiate use; Opiate use; Chronic knee pain (Left); Osteoarthritis of hip (Bilateral); Chronic Low back pain (Primary Area of Pain) (Bilateral)  (R>L); Chronic pain of lower extremity (Tertiary Area of Pain) (Bilateral) (L>R); Long term prescription benzodiazepine use; Grade 1 Anterolisthesis of L3 over L4; Failed back surgical syndrome; Chronic shoulder pain (Left); History of cervical fusion (ACDF C4-C7); DDD (degenerative disc disease), cervical; Chronic sacroiliac joint pain (Bilateral) (R>L); Lumbar Facet Syndrome (Bilateral) (R>L); Lumbar facet osteoarthritis (Bilateral); and DDD (degenerative disc disease), lumbar on their problem list. Her primarily concern today is the Back Pain (lower) and Neck Pain  Pain Assessment: Location: Lower Back Radiating: down front of left leg Onset: More than a month ago Duration: Chronic pain Quality: Aching, Shooting, Sharp, Nagging Severity: 3 /10 (self-reported pain score)  Note: Reported level is compatible with observation.                         When using our objective Pain Scale, levels between 6 and 10/10 are said to belong in an emergency room, as it progressively worsens from a 6/10, described as  severely limiting, requiring emergency care not usually available at an outpatient pain management facility. At a 6/10 level, communication becomes difficult and requires great effort. Assistance to reach the emergency department may be required. Facial flushing and profuse sweating along with potentially dangerous increases in heart rate and blood pressure will be evident. Effect on ADL: cant hardly do anything Timing: Constant Modifying factors: laying down relaxing whole body. Electric blanket  Ashley Fields was last scheduled for an appointment on 11/15/2016 for medication management. During today's appointment we reviewed Ashley Fields's chronic pain status, as well as her outpatient medication regimen. She states that her pain is back however it is "creeping back". She continues that have the pain worse on the left going to the thigh. She states that the back pain is worse than the leg pain. She states that she does have pain the left and right. She deneis any numbness or tingling but does have some weakness. She uses a cane.   The patient  reports that she does not use drugs. Her body mass index is 33.11 kg/m.  Further details on both, my assessment(s), as well as the proposed treatment plan, please see below.  Controlled Substance Pharmacotherapy Assessment REMS (Risk Evaluation and Mitigation Strategy)  Analgesic:Tramadol 50 mg, 1 tab PO q6hrs (200 mg/day) MME/day:14m/day.    NMorley Kos RN  12/13/2016 10:34 AM  Signed Nursing Pain Medication Assessment:  Safety precautions to be maintained throughout the outpatient stay will include: orient to surroundings, keep bed in low position, maintain call bell within reach at all times, provide assistance with transfer out of bed and ambulation.  Medication Inspection Compliance: Pill count conducted under aseptic conditions, in front of the patient. Neither the pills nor the bottle was removed from the patient's sight at any time. Once count  was completed pills were immediately returned to the patient in their original bottle.  Medication: Tramadol (Ultram) Pill/Patch Count: 29 of 120 pills remain Pill/Patch Appearance: Markings consistent with prescribed medication Bottle Appearance: Standard pharmacy container. Clearly labeled. Filled Date: 29 / 12/ 2018 Last Medication intake:  Today   Pharmacokinetics: Liberation and absorption (onset of action): WNL Distribution (time to peak effect): WNL Metabolism and excretion (duration of action): WNL         Pharmacodynamics: Desired effects: Analgesia: Ashley Fields reports >50% benefit. Functional ability: Patient reports that medication allows her to accomplish basic ADLs Clinically meaningful improvement in function (CMIF): Sustained CMIF goals met Perceived effectiveness: Described as relatively effective, allowing for increase in activities of daily living (ADL) Undesirable effects: Side-effects or Adverse reactions: None reported Monitoring: [place] PMP: Online review of the past 37-monthperiod conducted. Compliant with practice rules and regulations Last UDS on record: Summary  Date Value Ref Range Status  11/15/2016 FINAL  Final    Comment:    ==================================================================== TOXASSURE SELECT 13 (MW) ==================================================================== Test                             Result       Flag       Units Drug Present and Declared for Prescription Verification   Lorazepam                      138          EXPECTED   ng/mg creat    Source of lorazepam is a scheduled prescription medication.   Tramadol                       >6098        EXPECTED   ng/mg creat   O-Desmethyltramadol            >6098        EXPECTED   ng/mg creat   N-Desmethyltramadol            4685         EXPECTED   ng/mg creat    Source of tramadol is a prescription medication.    O-desmethyltramadol and N-desmethyltramadol are expected     metabolites of tramadol. Drug Absent but Declared for Prescription Verification   Butalbital                     Not Detected UNEXPECTED ==================================================================== Test                      Result    Flag   Units      Ref Range   Creatinine              82               mg/dL      >=20 ==================================================================== Declared Medications:  The flagging and interpretation on this report are based on the  following declared medications.  Unexpected results may arise from  inaccuracies in the declared medications.  **Note: The testing scope of this panel includes these medications:  Butalbital (Butalbital/APAP/Caffeine)  Lorazepam (Ativan)  Tramadol  **Note: The testing scope of this panel does not  include following  reported medications:  Acetaminophen (Butalbital/APAP/Caffeine)  Caffeine (Butalbital/APAP/Caffeine)  Fluticasone (Flonase)  Gabapentin  Iron  Melatonin  Multivitamin  Omeprazole (Prilosec)  Sertraline (Zoloft)  Tizanidine (Zanaflex)  Trazodone  Turmeric  Vitamin B (Biotin)  Vitamin B12  Vitamin C ==================================================================== For clinical consultation, please call [phone]. ====================================================================    UDS interpretation: Compliant          Medication Assessment Form: Reviewed. Patient indicates being compliant with therapy Treatment compliance: Compliant Risk Assessment Profile: Aberrant behavior: See prior evaluations. None observed or detected today Comorbid factors increasing risk of overdose: See prior notes. No additional risks detected today Risk of substance use disorder (SUD): Low Opioid Risk Tool - 12/13/16 0943      Family History of Substance Abuse   Alcohol  Positive Female fiance   fiance   Illegal Drugs  Negative    Rx Drugs  Negative      Personal History of Substance Abuse    Alcohol  Negative    Illegal Drugs  Negative    Rx Drugs  Negative      Age   Age between 55-45 years   No      History of Preadolescent Sexual Abuse   History of Preadolescent Sexual Abuse  Negative or Female      Psychological Disease   Psychological Disease  Negative    Depression  Positive      Total Score   Opioid Risk Tool Scoring  4    Opioid Risk Interpretation  Moderate Risk      ORT Scoring interpretation table:  Score <3 = Low Risk for SUD  Score between 4-7 = Moderate Risk for SUD  Score >8 = High Risk for Opioid Abuse   Risk Mitigation Strategies:  Patient Counseling: Covered Patient-Prescriber Agreement (PPA): Present and active  Notification to other healthcare providers: Done  Pharmacologic Plan: No change in therapy, at this time  Laboratory Chemistry  Inflammation Markers (CRP: Acute Phase) (ESR: Chronic Phase) Lab Results  Component Value Date   CRP 0.7 08/14/2016   ESRSEDRATE 9 08/14/2016                 Rheumatology Markers No results found for: Benay Spice, LYMEIGGIGMAB, [facility]              Renal Function Markers Lab Results  Component Value Date   BUN 17 08/14/2016   CREATININE 0.80 08/14/2016   GFRAA 93 08/14/2016   GFRNONAA 80 08/14/2016                 Hepatic Function Markers Lab Results  Component Value Date   Fields 20 08/14/2016   ALT 10 08/14/2016   ALBUMIN 4.9 (H) 08/14/2016   ALKPHOS 103 08/14/2016                 Electrolytes Lab Results  Component Value Date   NA 139 08/14/2016   K 4.1 08/14/2016   CL 97 08/14/2016   CALCIUM 9.6 08/14/2016   MG 2.3 08/14/2016                 Neuropathy Markers Lab Results  Component Value Date   VITAMINB12 473 08/14/2016                 Bone Pathology Markers Lab Results  Component Value Date   25OHVITD1 47 08/14/2016   25OHVITD2 <1.0 08/14/2016   25OHVITD3 47 08/14/2016  Coagulation Parameters No results found for: INR, LABPROT, APTT,  PLT, DDIMER               Cardiovascular Markers No results found for: BNP, CKTOTAL, CKMB, TROPONINI, HGB, HCT               CA Markers No results found for: CEA, CA125, LABCA2               Note: Lab results reviewed.  Recent Diagnostic Imaging Results  DG C-Arm 1-60 Min-No Report Fluoroscopy was utilized by the requesting physician.  No radiographic  interpretation.   Complexity Note: Imaging results reviewed. Results shared with Ashley Fields, using Layman's terms.                         Meds   Current Outpatient Medications:  .  Biotin 10000 MCG TABS, Take 10,000 mcg by mouth 1 day or 1 dose., Disp: , Rfl:  .  butalbital-acetaminophen-caffeine (FIORICET, ESGIC) 50-325-40 MG tablet, TAKE 1 TABLET BY MOUTH EVERY NIGHT AS NEEDED FOR HEADACHE, Disp: , Rfl: 2 .  estradiol (ESTRACE) 1 MG tablet, Take 1 mg by mouth daily., Disp: , Rfl:  .  ferrous [id number] C-folic acid (TRINSICON / FOLTRIN) capsule, Take 1 capsule by mouth 3 (three) times daily after meals., Disp: , Rfl:  .  fluticasone (FLONASE) 50 MCG/ACT nasal spray, 1 SPRAY IN EACH NOSTRIL ONCE A DAY NASALLY 30 DAYS, Disp: , Rfl: 3 .  gabapentin (NEURONTIN) 300 MG capsule, TAKE ONE CAPSULE BY MOUTH THREE times a day, Disp: , Rfl: 1 .  LORazepam (ATIVAN) 0.5 MG tablet, Take 0.5 mg by mouth 2 (two) times daily as needed., Disp: , Rfl: 0 .  Melatonin 10 MG CAPS, Take 1 capsule by mouth at bedtime., Disp: , Rfl:  .  Multiple Vitamin (MULTIVITAMIN WITH MINERALS) TABS tablet, Take 1 tablet by mouth daily., Disp: , Rfl:  .  omeprazole (PRILOSEC) 40 MG capsule, Take 40 mg by mouth daily., Disp: , Rfl: 0 .  sertraline (ZOLOFT) 100 MG tablet, Take 200 mg by mouth 2 (two) times daily. , Disp: , Rfl: 0 .  simvastatin (ZOCOR) 20 MG tablet, TAKE 1 TABLET IN THE EVENING ONCE A DAY ORALLY 90 DAYS, Disp: , Rfl: 0 .  [START ON 12/16/2016] tiZANidine (ZANAFLEX) 4 MG tablet, Take 1 tablet (4 mg total) by mouth 3 (three) times daily., Disp: 90  tablet, Rfl: 0 .  [START ON 12/16/2016] traMADol (ULTRAM) 50 MG tablet, Take 1 tablet (50 mg total) by mouth every 6 (six) hours as needed for severe pain., Disp: 120 tablet, Rfl: 0 .  traZODone (DESYREL) 150 MG tablet, Take 150 mg by mouth at bedtime., Disp: , Rfl: 0 .  Turmeric 500 MG CAPS, Take 2 capsules by mouth daily., Disp: , Rfl:  .  vitamin C (ASCORBIC ACID) 500 MG tablet, Take 500 mg 2 (two) times daily by mouth., Disp: , Rfl:   ROS  Constitutional: Denies any fever or chills Gastrointestinal: No reported hemesis, hematochezia, vomiting, or acute GI distress Musculoskeletal: Denies any acute onset joint swelling, redness, loss of ROM, or weakness Neurological: No reported episodes of acute onset apraxia, aphasia, dysarthria, agnosia, amnesia, paralysis, loss of coordination, or loss of consciousness  Allergies  Ashley Fields is allergic to flagyl [metronidazole].  PFSH  Drug: Ashley Fields  reports that she does not use drugs. Alcohol:  reports that she does not drink alcohol. Tobacco:  reports that  she has quit smoking. She quit after 4.00 years of use. she has never used smokeless tobacco. Medical:  has a past medical history of Allergy, Hyperlipidemia, and Thyroid disease. Surgical: Ashley Fields  has a past surgical history that includes Cesarean section; Abdominal hysterectomy; Breast reduction surgery; Cholecystectomy; Appendectomy; Gastric bypass (2005); spinal stimulator removed 2012 (2012); acrominoplasty (2015); mini tummy tuck; breast lift bilateral, implants; Lumbar laminectomy (2008); 5 miscarriages; and neck surgery C3-C7. Family: family history includes COPD in her mother; Cancer in her mother; Heart disease in her father.  Constitutional Exam  General appearance: Well nourished, well developed, and well hydrated. In no apparent acute distress Vitals:   12/13/16 0937  BP: (!) 165/82  Pulse: (!) 58  Resp: 16  Temp: 98.6 F (37 C)  TempSrc: Oral  SpO2: 99%  Weight: 181  lb (82.1 kg)  Height: 5' 2"  (1.575 m)   BMI Assessment: Estimated body mass index is 33.11 kg/m as calculated from the following:   Height as of this encounter: 5' 2"  (1.575 m).   Weight as of this encounter: 181 lb (82.1 kg).  BMI interpretation table: BMI level Category Range association with higher incidence of chronic pain  <18 kg/m2 Underweight   18.5-24.9 kg/m2 Ideal body weight   25-29.9 kg/m2 Overweight Increased incidence by 20%  30-34.9 kg/m2 Obese (Class I) Increased incidence by 68%  35-39.9 kg/m2 Severe obesity (Class II) Increased incidence by 136%  >40 kg/m2 Extreme obesity (Class III) Increased incidence by 254%   BMI Readings from Last 4 Encounters:  12/13/16 33.11 kg/m  11/15/16 33.65 kg/m  10/25/16 34.57 kg/m  10/15/16 34.75 kg/m   Wt Readings from Last 4 Encounters:  12/13/16 181 lb (82.1 kg)  11/15/16 184 lb (83.5 kg)  10/25/16 189 lb (85.7 kg)  10/15/16 190 lb (86.2 kg)  Psych/Mental status: Alert, oriented x 3 (person, place, & time)       Eyes: PERLA Respiratory: No evidence of acute respiratory distress  Lumbar Spine Area Exam  Skin & Axial Inspection: No masses, redness, or swelling Alignment: Symmetrical Functional ROM: Unrestricted ROM      Stability: No instability detected Muscle Tone/Strength: Functionally intact. No obvious neuro-muscular anomalies detected. Sensory (Neurological): Unimpaired Palpation: Complains of area being tender to palpation       Provocative Tests: Lumbar Hyperextension and rotation test: Positive on the left for facet joint pain. Lumbar Lateral bending test: evaluation deferred today       Patrick's Maneuver: evaluation deferred today                    Gait & Posture Assessment  Ambulation: Patient ambulates using a cane Gait: Relatively normal for age and body habitus Posture: WNL   Lower Extremity Exam    Side: Right lower extremity  Side: Left lower extremity  Skin & Extremity Inspection: Skin color,  temperature, and hair growth are WNL. No peripheral edema or cyanosis. No masses, redness, swelling, asymmetry, or associated skin lesions. No contractures.  Skin & Extremity Inspection: Skin color, temperature, and hair growth are WNL. No peripheral edema or cyanosis. No masses, redness, swelling, asymmetry, or associated skin lesions. No contractures.  Functional ROM: Unrestricted ROM          Functional ROM: Unrestricted ROM          Muscle Tone/Strength: Functionally intact. No obvious neuro-muscular anomalies detected.  Muscle Tone/Strength: Functionally intact. No obvious neuro-muscular anomalies detected.  Sensory (Neurological): Unimpaired  Sensory (Neurological): Unimpaired  Palpation:  No palpable anomalies  Palpation: No palpable anomalies   Assessment  Primary Diagnosis & Pertinent Problem List: The primary encounter diagnosis was Chronic neck pain . Diagnoses of Chronic Low back pain (Primary Area of Pain) (Bilateral)  (R>L), Chronic hip pain (Secondary Area of Pain) (Bilateral) (L>R), Chronic knee pain (Left), Lumbar Facet Syndrome (Bilateral) (R>L), and Chronic pain syndrome were also pertinent to this visit.  Status Diagnosis  Controlled Controlled Controlled 1. Chronic neck pain    2. Chronic Low back pain (Primary Area of Pain) (Bilateral)  (R>L)   3. Chronic hip pain (Secondary Area of Pain) (Bilateral) (L>R)   4. Chronic knee pain (Left)   5. Lumbar Facet Syndrome (Bilateral) (R>L)   6. Chronic pain syndrome     Problems updated and reviewed during this visit: No problems updated. Plan of Care  Pharmacotherapy (Medications Ordered): Meds ordered this encounter  Medications  . traMADol (ULTRAM) 50 MG tablet    Sig: Take 1 tablet (50 mg total) by mouth every 6 (six) hours as needed for severe pain.    Dispense:  120 tablet    Refill:  0    Fill one day early if pharmacy is closed on scheduled refill date. Do not fill until: 12/16/2016 To last until:01/15/2017    Order  Specific Question:   Supervising Provider    Answer:   Milinda Pointer [phone]  . tiZANidine (ZANAFLEX) 4 MG tablet    Sig: Take 1 tablet (4 mg total) by mouth 3 (three) times daily.    Dispense:  90 tablet    Refill:  0    Do not place medication on "Automatic Refill". Fill one day early if pharmacy is closed on scheduled refill date.    Order Specific Question:   Supervising Provider    Answer:   Milinda Pointer [[id number]]  This SmartLink is deprecated. Use AVSMEDLIST instead to display the medication list for a patient. Medications administered today: Jannah Guardiola had no medications administered during this visit. Lab-work, procedure(s), and/or referral(s): Orders Placed This Encounter  Procedures  . LUMBAR FACET(MEDIAL BRANCH NERVE BLOCK) MBNB   Imaging and/or referral(s): None  Interventional therapies: Planned, scheduled, and/or pending:   Bilateral lumbar facet NB    Considering: Diagnosticbilateral lumbar epidural steroid injection Possible bilateral lumbar facet block RFA Diagnosticleft hip injection Diagnosticleft femoral nerve + obturator nerve block  Diagnostic left knee intra-articular steroid injection Diagnosticleft genicular nerve block Possible left knee RFA  Diagnosticcervical epidural steroid injection Diagnosticbilateral cervical facet block Possiblebilateral cervical RFA    Provider-requested follow-up: Return in about 4 weeks (around 01/10/2017) for MedMgmt with Me Dionisio David), in addition, Procedure(NS), w/ Dr. Dossie Arbour, ([facility]).  Future Appointments  Date Time Provider [place]  01/14/2017 11:30 AM Vevelyn Francois, NP [facility] None  01/15/2017  1:30 PM Lucilla Lame, MD [facility] None   Primary Care Physician: Cletis Athens, MD Location: [facility] Outpatient [facility] Management Facility Note by: Vevelyn Francois NP Date: 12/13/2016; Time: 10:46 AM  Pain Score Disclaimer: We use the NRS-11 scale. This is a self-reported, subjective  measurement of pain severity with only modest accuracy. It is used primarily to identify changes within a particular patient. It must be understood that outpatient pain scales are significantly less accurate that those used for research, where they can be applied under ideal controlled circumstances with minimal exposure to variables. In reality, the score is likely to be a combination of pain intensity and pain affect, where pain affect describes the degree of emotional  arousal or changes in action readiness caused by the sensory experience of pain. Factors such as social and work situation, setting, emotional state, anxiety levels, expectation, and prior pain experience may influence pain perception and show large inter-individual differences that may also be affected by time variables.  Patient instructions provided during this appointment: Patient Instructions   ____________________________________________________________________________________________  Medication Rules  Applies to: All patients receiving prescriptions (written or electronic).  Pharmacy of record: Pharmacy where electronic prescriptions will be sent. If written prescriptions are taken to a different pharmacy, please inform the nursing staff. The pharmacy listed in the electronic medical record should be the one where you would like electronic prescriptions to be sent.  Prescription refills: Only during scheduled appointments. Applies to both, written and electronic prescriptions.  NOTE: The following applies primarily to controlled substances (Opioid* Pain Medications).   Patient's responsibilities: 1. Pain Pills: Bring all pain pills to every appointment (except for procedure appointments). 2. Pill Bottles: Bring pills in original pharmacy bottle. Always bring newest bottle. Bring bottle, even if empty. 3. Medication refills: You are responsible for knowing and keeping track of what medications you need refilled. The day  before your appointment, write a list of all prescriptions that need to be refilled. Bring that list to your appointment and give it to the admitting nurse. Prescriptions will be written only during appointments. If you forget a medication, it will not be "Called in", "Faxed", or "electronically sent". You will need to get another appointment to get these prescribed. 4. Prescription Accuracy: You are responsible for carefully inspecting your prescriptions before leaving our office. Have the discharge nurse carefully go over each prescription with you, before taking them home. Make sure that your name is accurately spelled, that your address is correct. Check the name and dose of your medication to make sure it is accurate. Check the number of pills, and the written instructions to make sure they are clear and accurate. Make sure that you are given enough medication to last until your next medication refill appointment. 5. Taking Medication: Take medication as prescribed. Never take more pills than instructed. Never take medication more frequently than prescribed. Taking less pills or less frequently is permitted and encouraged, when it comes to controlled substances (written prescriptions).  6. Inform other Doctors: Always inform, all of your healthcare providers, of all the medications you take. 7. Pain Medication from other Providers: You are not allowed to accept any additional pain medication from any other Doctor or Healthcare provider. There are two exceptions to this rule. (see below) In the event that you require additional pain medication, you are responsible for notifying us, as stated below. 8. Medication Agreement: You are responsible for carefully reading and following our Medication Agreement. This must be signed before receiving any prescriptions from our practice. Safely store a copy of your signed Agreement. Violations to the Agreement will result in no further prescriptions. (Additional copies  of our Medication Agreement are available upon request.) 9. Laws, Rules, & Regulations: All patients are expected to follow all Federal and [employer], [employer], Rules, [employer]. Ignorance of the Laws does not constitute a valid excuse. The use of any illegal substances is prohibited. 10. Adopted CDC guidelines & recommendations: Target dosing levels will be at or below 60 MME/day. Use of benzodiazepines** is not recommended.  Exceptions: There are only two exceptions to the rule of not receiving pain medications from other Healthcare Providers. 1. Exception #1 (Emergencies): In the event of an emergency (i.e.:  accident requiring emergency care), you are allowed to receive additional pain medication. However, you are responsible for: As soon as you are able, call our office [phone], at any time of the day or night, and leave a message stating your name, the date and nature of the emergency, and the name and dose of the medication prescribed. In the event that your call is answered by a member of our staff, make sure to document and save the date, time, and the name of the person that took your information.  2. Exception #2 (Planned Surgery): In the event that you are scheduled by another doctor or dentist to have any type of surgery or procedure, you are allowed (for a period no longer than 30 days), to receive additional pain medication, for the acute post-op pain. However, in this case, you are responsible for picking up a copy of our "Post-op Pain Management for Surgeons" handout, and giving it to your surgeon or dentist. This document is available at our office, and does not require an appointment to obtain it. Simply go to our office during business hours (Monday-Thursday from 8:00 AM to 4:00 PM) (Friday 8:00 AM to 12:00 Noon) or if you have a scheduled appointment with [place], prior to your surgery, and ask for it by name. In addition, you will need to provide [place] with your name, name of your  surgeon, type of surgery, and date of procedure or surgery.  *Opioid medications include: morphine, codeine, oxycodone, oxymorphone, hydrocodone, hydromorphone, meperidine, tramadol, tapentadol, buprenorphine, fentanyl, methadone. **Benzodiazepine medications include: diazepam (Valium), alprazolam (Xanax), clonazepam (Klonopine), lorazepam (Ativan), clorazepate (Tranxene), chlordiazepoxide (Librium), estazolam (Prosom), oxazepam (Serax), temazepam (Restoril), triazolam (Halcion)  ____________________________________________________________________________________________  Prescription given for tramadol

## 2016-12-13 NOTE — Patient Instructions (Addendum)
____________________________________________________________________________________________  Medication Rules  Applies to: All patients receiving prescriptions (written or electronic).  Pharmacy of record: Pharmacy where electronic prescriptions will be sent. If written prescriptions are taken to a different pharmacy, please inform the nursing staff. The pharmacy listed in the electronic medical record should be the one where you would like electronic prescriptions to be sent.  Prescription refills: Only during scheduled appointments. Applies to both, written and electronic prescriptions.  NOTE: The following applies primarily to controlled substances (Opioid* Pain Medications).   Patient's responsibilities: 1. Pain Pills: Bring all pain pills to every appointment (except for procedure appointments). 2. Pill Bottles: Bring pills in original pharmacy bottle. Always bring newest bottle. Bring bottle, even if empty. 3. Medication refills: You are responsible for knowing and keeping track of what medications you need refilled. The day before your appointment, write a list of all prescriptions that need to be refilled. Bring that list to your appointment and give it to the admitting nurse. Prescriptions will be written only during appointments. If you forget a medication, it will not be "Called in", "Faxed", or "electronically sent". You will need to get another appointment to get these prescribed. 4. Prescription Accuracy: You are responsible for carefully inspecting your prescriptions before leaving our office. Have the discharge nurse carefully go over each prescription with you, before taking them home. Make sure that your name is accurately spelled, that your address is correct. Check the name and dose of your medication to make sure it is accurate. Check the number of pills, and the written instructions to make sure they are clear and accurate. Make sure that you are given enough medication to  last until your next medication refill appointment. 5. Taking Medication: Take medication as prescribed. Never take more pills than instructed. Never take medication more frequently than prescribed. Taking less pills or less frequently is permitted and encouraged, when it comes to controlled substances (written prescriptions).  6. Inform other Doctors: Always inform, all of your healthcare providers, of all the medications you take. 7. Pain Medication from other Providers: You are not allowed to accept any additional pain medication from any other Doctor or Healthcare provider. There are two exceptions to this rule. (see below) In the event that you require additional pain medication, you are responsible for notifying us, as stated below. 8. Medication Agreement: You are responsible for carefully reading and following our Medication Agreement. This must be signed before receiving any prescriptions from our practice. Safely store a copy of your signed Agreement. Violations to the Agreement will result in no further prescriptions. (Additional copies of our Medication Agreement are available upon request.) 9. Laws, Rules, & Regulations: All patients are expected to follow all Federal and State Laws, Statutes, Rules, & Regulations. Ignorance of the Laws does not constitute a valid excuse. The use of any illegal substances is prohibited. 10. Adopted CDC guidelines & recommendations: Target dosing levels will be at or below 60 MME/day. Use of benzodiazepines** is not recommended.  Exceptions: There are only two exceptions to the rule of not receiving pain medications from other Healthcare Providers. 1. Exception #1 (Emergencies): In the event of an emergency (i.e.: accident requiring emergency care), you are allowed to receive additional pain medication. However, you are responsible for: As soon as you are able, call our office [phone], at any time of the day or night, and leave a message stating your  name, the date and nature of the emergency, and the name and dose of the medication   prescribed. In the event that your call is answered by a member of our staff, make sure to document and save the date, time, and the name of the person that took your information.  2. Exception #2 (Planned Surgery): In the event that you are scheduled by another doctor or dentist to have any type of surgery or procedure, you are allowed (for a period no longer than 30 days), to receive additional pain medication, for the acute post-op pain. However, in this case, you are responsible for picking up a copy of our "Post-op Pain Management for Surgeons" handout, and giving it to your surgeon or dentist. This document is available at our office, and does not require an appointment to obtain it. Simply go to our office during business hours (Monday-Thursday from 8:00 AM to 4:00 PM) (Friday 8:00 AM to 12:00 Noon) or if you have a scheduled appointment with [place], prior to your surgery, and ask for it by name. In addition, you will need to provide [place] with your name, name of your surgeon, type of surgery, and date of procedure or surgery.  *Opioid medications include: morphine, codeine, oxycodone, oxymorphone, hydrocodone, hydromorphone, meperidine, tramadol, tapentadol, buprenorphine, fentanyl, methadone. **Benzodiazepine medications include: diazepam (Valium), alprazolam (Xanax), clonazepam (Klonopine), lorazepam (Ativan), clorazepate (Tranxene), chlordiazepoxide (Librium), estazolam (Prosom), oxazepam (Serax), temazepam (Restoril), triazolam (Halcion)  ____________________________________________________________________________________________  Prescription given for tramadol

## 2016-12-13 NOTE — Addendum Note (Signed)
Addended by: Vevelyn Francois on: 12/13/2016 10:23 AM   Modules accepted: Orders

## 2016-12-27 ENCOUNTER — Ambulatory Visit: Payer: Medicare HMO | Admitting: Pain Medicine

## 2017-01-01 DIAGNOSIS — S92211A Displaced fracture of cuboid bone of right foot, initial encounter for closed fracture: Secondary | ICD-10-CM

## 2017-01-01 HISTORY — DX: Displaced fracture of cuboid bone of right foot, initial encounter for closed fracture: S92.211A

## 2017-01-02 DIAGNOSIS — S93601A Unspecified sprain of right foot, initial encounter: Secondary | ICD-10-CM | POA: Diagnosis not present

## 2017-01-03 DIAGNOSIS — S92354A Nondisplaced fracture of fifth metatarsal bone, right foot, initial encounter for closed fracture: Secondary | ICD-10-CM | POA: Diagnosis not present

## 2017-01-04 ENCOUNTER — Telehealth: Payer: Self-pay

## 2017-01-04 ENCOUNTER — Telehealth: Payer: Self-pay | Admitting: Pain Medicine

## 2017-01-04 NOTE — Telephone Encounter (Signed)
Attempted to call pt no answer  

## 2017-01-04 NOTE — Telephone Encounter (Signed)
Attempted to call patient.  No answer.

## 2017-01-04 NOTE — Telephone Encounter (Signed)
Patient fell and broke foot, and fell again, was told she could not get medicines because she comes to pain clinic. Please call asap.

## 2017-01-04 NOTE — Telephone Encounter (Signed)
Spoke with patient and was instructed that any time there is an acute situation that we would like the managing provider to handle pain medication for that situation and that we would do not want the patient to use her chronic pain medication for the acute pain.  She was seen by [facility] which denied pain medication d/t pain contract with [place].  They are not in office today and she has not contact info for after hours.  Patient is currently elevating and using ice to the foot.  Instructed that she could use ibuprofen 800 mg q 6 - 8 hours for pain between her tramadol and f/up with ortho when available.  Instructed that if the swelling or pain became unbearable that she could go to an urgent care or the ED.  Patient verbalizes u/o information given.

## 2017-01-14 ENCOUNTER — Encounter: Payer: Self-pay | Admitting: Nurse Practitioner

## 2017-01-14 ENCOUNTER — Ambulatory Visit: Payer: Medicare HMO | Attending: Nurse Practitioner | Admitting: Nurse Practitioner

## 2017-01-14 ENCOUNTER — Other Ambulatory Visit: Payer: Self-pay

## 2017-01-14 VITALS — BP 113/66 | HR 105 | Temp 98.4°F | Ht 62.0 in | Wt 176.0 lb

## 2017-01-14 DIAGNOSIS — M542 Cervicalgia: Secondary | ICD-10-CM | POA: Insufficient documentation

## 2017-01-14 DIAGNOSIS — M5136 Other intervertebral disc degeneration, lumbar region: Secondary | ICD-10-CM | POA: Diagnosis not present

## 2017-01-14 DIAGNOSIS — G8929 Other chronic pain: Secondary | ICD-10-CM

## 2017-01-14 DIAGNOSIS — M79604 Pain in right leg: Secondary | ICD-10-CM

## 2017-01-14 DIAGNOSIS — M25562 Pain in left knee: Secondary | ICD-10-CM | POA: Insufficient documentation

## 2017-01-14 DIAGNOSIS — M25559 Pain in unspecified hip: Secondary | ICD-10-CM

## 2017-01-14 DIAGNOSIS — M5441 Lumbago with sciatica, right side: Secondary | ICD-10-CM

## 2017-01-14 DIAGNOSIS — M5442 Lumbago with sciatica, left side: Secondary | ICD-10-CM

## 2017-01-14 DIAGNOSIS — G894 Chronic pain syndrome: Secondary | ICD-10-CM | POA: Diagnosis not present

## 2017-01-14 DIAGNOSIS — M25512 Pain in left shoulder: Secondary | ICD-10-CM | POA: Insufficient documentation

## 2017-01-14 DIAGNOSIS — Z981 Arthrodesis status: Secondary | ICD-10-CM | POA: Insufficient documentation

## 2017-01-14 DIAGNOSIS — Z9884 Bariatric surgery status: Secondary | ICD-10-CM | POA: Diagnosis not present

## 2017-01-14 DIAGNOSIS — Z79899 Other long term (current) drug therapy: Secondary | ICD-10-CM | POA: Diagnosis not present

## 2017-01-14 DIAGNOSIS — E785 Hyperlipidemia, unspecified: Secondary | ICD-10-CM | POA: Insufficient documentation

## 2017-01-14 DIAGNOSIS — M79605 Pain in left leg: Secondary | ICD-10-CM | POA: Diagnosis not present

## 2017-01-14 DIAGNOSIS — Z79891 Long term (current) use of opiate analgesic: Secondary | ICD-10-CM | POA: Insufficient documentation

## 2017-01-14 DIAGNOSIS — M1610 Unilateral primary osteoarthritis, unspecified hip: Secondary | ICD-10-CM | POA: Insufficient documentation

## 2017-01-14 DIAGNOSIS — M545 Low back pain: Secondary | ICD-10-CM | POA: Insufficient documentation

## 2017-01-14 MED ORDER — TRAMADOL HCL 50 MG PO TABS: 50.0000 mg | ORAL_TABLET | Freq: Four times a day (QID) | ORAL | 0 refills | Status: DC | PRN

## 2017-01-14 MED ORDER — TIZANIDINE HCL 4 MG PO TABS: 4.0000 mg | ORAL_TABLET | Freq: Three times a day (TID) | ORAL | 0 refills | Status: DC

## 2017-01-14 NOTE — Progress Notes (Signed)
Nursing Pain Medication Assessment:  Safety precautions to be maintained throughout the outpatient stay will include: orient to surroundings, keep bed in low position, maintain call bell within reach at all times, provide assistance with transfer out of bed and ambulation.  Medication Inspection Compliance: Pill count conducted under aseptic conditions, in front of the patient. Neither the pills nor the bottle was removed from the patient's sight at any time. Once count was completed pills were immediately returned to the patient in their original bottle.  Medication #1: Tramadol (Ultram) Pill/Patch Count: 7 of 120 pills remain Pill/Patch Appearance: Markings consistent with prescribed medication Bottle Appearance: Standard pharmacy container. Clearly labeled. Filled Date: 12/18/2016 Last Medication intake:  Today  Medication #2: Hydrocodone/APAP Pill/Patch Count: 2 of 20 pills remain Pill/Patch Appearance: Markings consistent with prescribed medication Bottle Appearance: Standard pharmacy container. Clearly labeled. Filled Date: 1 /2 / 2018 Last Medication intake:  Today

## 2017-01-14 NOTE — Patient Instructions (Signed)

## 2017-01-14 NOTE — Progress Notes (Signed)
Patient's Name: Ashley Fields  MRN: [id number]  Referring Provider: Cletis Athens, MD  DOB: 11/13/1955  PCP: Cletis Athens, MD  DOS: 01/14/2017  Note by: Vevelyn Francois NP  Service setting: Ambulatory outpatient  Specialty: Interventional Pain Management  Location: [facility] (AMB) Pain Management Facility    Patient type: Established    Primary Reason(s) for Visit: Encounter for prescription drug management. (Level of risk: moderate)  CC: Back Pain (lower)  HPI  Ashley Fields is a 62 y.o. year old, female patient, who comes today for a medication management evaluation. She has Chronic pain syndrome; Chronic neck pain ; Chronic hip pain (Secondary Area of Pain) (Bilateral) (L>R); Long term current use of opiate analgesic; Long term prescription opiate use; Opiate use; Chronic knee pain (Left); Osteoarthritis of hip (Bilateral); Chronic Low back pain (Primary Area of Pain) (Bilateral)  (R>L); Chronic pain of lower extremity (Tertiary Area of Pain) (Bilateral) (L>R); Long term prescription benzodiazepine use; Grade 1 Anterolisthesis of L3 over L4; Failed back surgical syndrome; Chronic shoulder pain (Left); History of cervical fusion (ACDF C4-C7); DDD (degenerative disc disease), cervical; Chronic sacroiliac joint pain (Bilateral) (R>L); Lumbar Facet Syndrome (Bilateral) (R>L); Lumbar facet osteoarthritis (Bilateral); and DDD (degenerative disc disease), lumbar on their problem list. Her primarily concern today is the Back Pain (lower)  Pain Assessment: Location: Lower Back Radiating: down left hip Onset: More than a month ago Duration: Chronic pain Quality: Constant, Sharp, Aching, Nagging Severity: 3 /10 (self-reported pain score)  Note: Reported level is compatible with observation.                          Timing: Constant Modifying factors: resting in recliner with a pillow behind back  Ashley Fields was last scheduled for an appointment on 12/13/2016 for medication management. During today's appointment  we reviewed Ashley Fields's chronic pain status, as well as her outpatient medication regimen. She has had 3 falls. She fractured her right foot. She is wearing a high boot. She was given Norco for the acute pain. She states that this makes her left hip pain worse. She is scheduled to have bilateral facet block on Thursday.  The patient  reports that she does not use drugs. Her body mass index is 32.19 kg/m.  Further details on both, my assessment(s), as well as the proposed treatment plan, please see below.  Controlled Substance Pharmacotherapy Assessment REMS (Risk Evaluation and Mitigation Strategy)  Analgesic:Tramadol 50 mg, 1 tab PO q6hrs (200 mg/day) MME/day:94m/day.    BChauncey Fischer RN  01/14/2017 11:58 AM  Sign at close encounter Nursing Pain Medication Assessment:  Safety precautions to be maintained throughout the outpatient stay will include: orient to surroundings, keep bed in low position, maintain call bell within reach at all times, provide assistance with transfer out of bed and ambulation.  Medication Inspection Compliance: Pill count conducted under aseptic conditions, in front of the patient. Neither the pills nor the bottle was removed from the patient's sight at any time. Once count was completed pills were immediately returned to the patient in their original bottle.  Medication #1: Tramadol (Ultram) Pill/Patch Count: 7 of 120 pills remain Pill/Patch Appearance: Markings consistent with prescribed medication Bottle Appearance: Standard pharmacy container. Clearly labeled. Filled Date: 12/18/2016 Last Medication intake:  Today  Medication #2: Hydrocodone/APAP Pill/Patch Count: 2 of 20 pills remain Pill/Patch Appearance: Markings consistent with prescribed medication Bottle Appearance: Standard pharmacy container. Clearly labeled. Filled Date: 1 /2 / 2018 Last Medication  intake:  Today   Pharmacokinetics: Liberation and absorption (onset of action):  WNL Distribution (time to peak effect): WNL Metabolism and excretion (duration of action): WNL         Pharmacodynamics: Desired effects: Analgesia: Ashley Fields reports >50% benefit. Functional ability: Patient reports that medication allows her to accomplish basic ADLs Clinically meaningful improvement in function (CMIF): Sustained CMIF goals met Perceived effectiveness: Described as relatively effective, allowing for increase in activities of daily living (ADL) Undesirable effects: Side-effects or Adverse reactions: None reported Monitoring: [place] PMP: Online review of the past 9-monthperiod conducted. Compliant with practice rules and regulations Last UDS on record: Summary  Date Value Ref Range Status  11/15/2016 FINAL  Final    Comment:    ==================================================================== TOXASSURE SELECT 13 (MW) ==================================================================== Test                             Result       Flag       Units Drug Present and Declared for Prescription Verification   Lorazepam                      138          EXPECTED   ng/mg creat    Source of lorazepam is a scheduled prescription medication.   Tramadol                       >6098        EXPECTED   ng/mg creat   O-Desmethyltramadol            >6098        EXPECTED   ng/mg creat   N-Desmethyltramadol            4685         EXPECTED   ng/mg creat    Source of tramadol is a prescription medication.    O-desmethyltramadol and N-desmethyltramadol are expected    metabolites of tramadol. Drug Absent but Declared for Prescription Verification   Butalbital                     Not Detected UNEXPECTED ==================================================================== Test                      Result    Flag   Units      Ref Range   Creatinine              82               mg/dL      >=20 ==================================================================== Declared Medications:   The flagging and interpretation on this report are based on the  following declared medications.  Unexpected results may arise from  inaccuracies in the declared medications.  **Note: The testing scope of this panel includes these medications:  Butalbital (Butalbital/APAP/Caffeine)  Lorazepam (Ativan)  Tramadol  **Note: The testing scope of this panel does not include following  reported medications:  Acetaminophen (Butalbital/APAP/Caffeine)  Caffeine (Butalbital/APAP/Caffeine)  Fluticasone (Flonase)  Gabapentin  Iron  Melatonin  Multivitamin  Omeprazole (Prilosec)  Sertraline (Zoloft)  Tizanidine (Zanaflex)  Trazodone  Turmeric  Vitamin B (Biotin)  Vitamin B12  Vitamin C ==================================================================== For clinical consultation, please call [phone] ====================================================================    UDS interpretation: Compliant          Medication Assessment Form: Reviewed. Patient indicates being compliant  with therapy Treatment compliance: Compliant Risk Assessment Profile: Aberrant behavior: See prior evaluations. None observed or detected today Comorbid factors increasing risk of overdose: See prior notes. No additional risks detected today Risk of substance use disorder (SUD): Low Opioid Risk Tool - 01/14/17 1149      Family History of Substance Abuse   Alcohol  Negative    Illegal Drugs  Negative    Rx Drugs  Negative      Personal History of Substance Abuse   Alcohol  Negative    Illegal Drugs  Negative    Rx Drugs  Negative      Age   Age between 34-45 years   No      History of Preadolescent Sexual Abuse   History of Preadolescent Sexual Abuse  Negative or Female      Psychological Disease   Psychological Disease  Negative    Depression  Positive      Total Score   Opioid Risk Tool Scoring  1    Opioid Risk Interpretation  Low Risk      ORT Scoring interpretation table:  Score <3  = Low Risk for SUD  Score between 4-7 = Moderate Risk for SUD  Score >8 = High Risk for Opioid Abuse   Risk Mitigation Strategies:  Patient Counseling: Covered Patient-Prescriber Agreement (PPA): Present and active  Notification to other healthcare providers: Done  Pharmacologic Plan: No change in therapy, at this time.             Laboratory Chemistry  Inflammation Markers (CRP: Acute Phase) (ESR: Chronic Phase) Lab Results  Component Value Date   CRP 0.7 08/14/2016   ESRSEDRATE 9 08/14/2016                 Rheumatology Markers No results found for: Elayne Guerin, [facility]              Renal Function Markers Lab Results  Component Value Date   BUN 17 08/14/2016   CREATININE 0.80 08/14/2016   GFRAA 93 08/14/2016   GFRNONAA 80 08/14/2016                 Hepatic Function Markers Lab Results  Component Value Date   AST 20 08/14/2016   ALT 10 08/14/2016   ALBUMIN 4.9 (H) 08/14/2016   ALKPHOS 103 08/14/2016                 Electrolytes Lab Results  Component Value Date   NA 139 08/14/2016   K 4.1 08/14/2016   CL 97 08/14/2016   CALCIUM 9.6 08/14/2016   MG 2.3 08/14/2016                 Neuropathy Markers Lab Results  Component Value Date   VITAMINB12 473 08/14/2016                 Bone Pathology Markers Lab Results  Component Value Date   25OHVITD1 47 08/14/2016   25OHVITD2 <1.0 08/14/2016   25OHVITD3 47 08/14/2016                 Coagulation Parameters No results found for: INR, LABPROT, APTT, PLT, DDIMER               Cardiovascular Markers No results found for: BNP, CKTOTAL, CKMB, TROPONINI, HGB, HCT               CA Markers No results found for: CEA, CA125, LABCA2  Note: Lab results reviewed.  Recent Diagnostic Imaging Results  DG C-Arm 1-60 Min-No Report Fluoroscopy was utilized by the requesting physician.  No radiographic  interpretation.   Complexity Note: Imaging results reviewed. Results  shared with Ashley Fields, using Layman's terms.                         Meds   Current Outpatient Medications:  .  Biotin 10000 MCG TABS, Take 10,000 mcg by mouth 1 day or 1 dose., Disp: , Rfl:  .  butalbital-acetaminophen-caffeine (FIORICET, ESGIC) 50-325-40 MG tablet, TAKE 1 TABLET BY MOUTH EVERY NIGHT AS NEEDED FOR HEADACHE, Disp: , Rfl: 2 .  estradiol (ESTRACE) 1 MG tablet, Take 1 mg by mouth daily., Disp: , Rfl:  .  ferrous [id number] C-folic acid (TRINSICON / FOLTRIN) capsule, Take 1 capsule by mouth 3 (three) times daily after meals., Disp: , Rfl:  .  fluticasone (FLONASE) 50 MCG/ACT nasal spray, 1 SPRAY IN EACH NOSTRIL ONCE A DAY NASALLY 30 DAYS, Disp: , Rfl: 3 .  gabapentin (NEURONTIN) 300 MG capsule, TAKE ONE CAPSULE BY MOUTH THREE times a day, Disp: , Rfl: 1 .  HYDROcodone-acetaminophen (NORCO/VICODIN) 5-325 MG tablet, Take 1 tablet by mouth every 6 (six) hours as needed for moderate pain., Disp: , Rfl:  .  LORazepam (ATIVAN) 0.5 MG tablet, Take 0.5 mg by mouth 2 (two) times daily as needed., Disp: , Rfl: 0 .  Melatonin 10 MG CAPS, Take 1 capsule by mouth at bedtime., Disp: , Rfl:  .  Multiple Vitamin (MULTIVITAMIN WITH MINERALS) TABS tablet, Take 1 tablet by mouth daily., Disp: , Rfl:  .  omeprazole (PRILOSEC) 40 MG capsule, Take 40 mg by mouth daily., Disp: , Rfl: 0 .  sertraline (ZOLOFT) 100 MG tablet, Take 200 mg by mouth 2 (two) times daily. , Disp: , Rfl: 0 .  simvastatin (ZOCOR) 20 MG tablet, TAKE 1 TABLET IN THE EVENING ONCE A DAY ORALLY 90 DAYS, Disp: , Rfl: 0 .  traZODone (DESYREL) 150 MG tablet, Take 150 mg by mouth at bedtime., Disp: , Rfl: 0 .  Turmeric 500 MG CAPS, Take 2 capsules by mouth daily., Disp: , Rfl:  .  vitamin C (ASCORBIC ACID) 500 MG tablet, Take 500 mg 2 (two) times daily by mouth., Disp: , Rfl:  .  [START ON 01/15/2017] tiZANidine (ZANAFLEX) 4 MG tablet, Take 1 tablet (4 mg total) by mouth 3 (three) times daily., Disp: 90 tablet, Rfl: 0 .  [START  ON 01/15/2017] traMADol (ULTRAM) 50 MG tablet, Take 1 tablet (50 mg total) by mouth every 6 (six) hours as needed for severe pain., Disp: 120 tablet, Rfl: 0  ROS  Constitutional: Denies any fever or chills Gastrointestinal: No reported hemesis, hematochezia, vomiting, or acute GI distress Musculoskeletal: Denies any acute onset joint swelling, redness, loss of ROM, or weakness Neurological: No reported episodes of acute onset apraxia, aphasia, dysarthria, agnosia, amnesia, paralysis, loss of coordination, or loss of consciousness  Allergies  Ashley Fields is allergic to flagyl [metronidazole].  PFSH  Drug: Ashley Fields  reports that she does not use drugs. Alcohol:  reports that she does not drink alcohol. Tobacco:  reports that she has quit smoking. She quit after 4.00 years of use. she has never used smokeless tobacco. Medical:  has a past medical history of Allergy, Disp fx of cuboid bone of right foot, init for clos fx (01/01/2017), Hyperlipidemia, and Thyroid disease. Surgical: Ashley Fields  has a  past surgical history that includes Cesarean section; Abdominal hysterectomy; Breast reduction surgery; Cholecystectomy; Appendectomy; Gastric bypass (2005); spinal stimulator removed 2012 (2012); acrominoplasty (2015); mini tummy tuck; breast lift bilateral, implants; Lumbar laminectomy (2008); 5 miscarriages; and neck surgery C3-C7. Family: family history includes COPD in her mother; Cancer in her mother; Heart disease in her father.  Constitutional Exam  General appearance: Well nourished, well developed, and well hydrated. In no apparent acute distress Vitals:   01/14/17 1131  BP: 113/66  Pulse: (!) 105  Temp: 98.4 F (36.9 C)  SpO2: 99%  Weight: 176 lb (79.8 kg)  Height: 5' 2" (1.575 m)   BMI Assessment: Estimated body mass index is 32.19 kg/m as calculated from the following:   Height as of this encounter: 5' 2" (1.575 m).   Weight as of this encounter: 176 lb (79.8 kg). Psych/Mental  status: Alert, oriented x 3 (person, place, & time)       Eyes: PERLA Respiratory: No evidence of acute respiratory distress  Cervical Spine Area Exam  Skin & Axial Inspection: No masses, redness, edema, swelling, or associated skin lesions Alignment: Symmetrical Functional ROM: Unrestricted ROM      Stability: No instability detected Muscle Tone/Strength: Functionally intact. No obvious neuro-muscular anomalies detected. Sensory (Neurological): Unimpaired Palpation: No palpable anomalies              Upper Extremity (UE) Exam    Side: Right upper extremity  Side: Left upper extremity  Skin & Extremity Inspection: Skin color, temperature, and hair growth are WNL. No peripheral edema or cyanosis. No masses, redness, swelling, asymmetry, or associated skin lesions. No contractures.  Skin & Extremity Inspection: Skin color, temperature, and hair growth are WNL. No peripheral edema or cyanosis. No masses, redness, swelling, asymmetry, or associated skin lesions. No contractures.  Functional ROM: Unrestricted ROM          Functional ROM: Unrestricted ROM          Muscle Tone/Strength: Functionally intact. No obvious neuro-muscular anomalies detected.  Muscle Tone/Strength: Functionally intact. No obvious neuro-muscular anomalies detected.  Sensory (Neurological): Unimpaired          Sensory (Neurological): Unimpaired          Palpation: No palpable anomalies              Palpation: No palpable anomalies              Specialized Test(s): Deferred         Specialized Test(s): Deferred          Thoracic Spine Area Exam  Skin & Axial Inspection: No masses, redness, or swelling Alignment: Symmetrical Functional ROM: Unrestricted ROM Stability: No instability detected Muscle Tone/Strength: Functionally intact. No obvious neuro-muscular anomalies detected. Sensory (Neurological): Unimpaired Muscle strength & Tone: No palpable anomalies  Lumbar Spine Area Exam  Skin & Axial Inspection: No masses,  redness, or swelling Alignment: Symmetrical Functional ROM: Unrestricted ROM      Stability: No instability detected Muscle Tone/Strength: Functionally intact. No obvious neuro-muscular anomalies detected. Sensory (Neurological): Unimpaired Palpation: Complains of area being tender to palpation       Provocative Tests: Lumbar Hyperextension and rotation test: Positive bilaterally for facet joint pain. Lumbar Lateral bending test: evaluation deferred today       Patrick's Maneuver: evaluation deferred today                    Gait & Posture Assessment  Ambulation: Unassisted Gait: Relatively normal for age and  body habitus Posture: WNL   Lower Extremity Exam    Side: Right lower extremity  Side: Left lower extremity  Skin & Extremity Inspection: high boot in place  Skin & Extremity Inspection: Skin color, temperature, and hair growth are WNL. No peripheral edema or cyanosis. No masses, redness, swelling, asymmetry, or associated skin lesions. No contractures.  Functional ROM: Pain restricted ROM          Functional ROM: Unrestricted ROM          Muscle Tone/Strength: Functionally intact. No obvious neuro-muscular anomalies detected.  Muscle Tone/Strength: Functionally intact. No obvious neuro-muscular anomalies detected.  Sensory (Neurological): Unimpaired  Sensory (Neurological): Unimpaired  Palpation: No palpable anomalies  Palpation: No palpable anomalies   Assessment  Primary Diagnosis & Pertinent Problem List: The primary encounter diagnosis was Chronic pain of lower extremity (Tertiary Area of Pain) (Bilateral) (L>R). Diagnoses of Chronic Low back pain (Primary Area of Pain) (Bilateral)  (R>L), Chronic hip pain (Secondary Area of Pain) (Bilateral) (L>R), Chronic knee pain (Left), and Chronic pain syndrome were also pertinent to this visit.  Status Diagnosis  Controlled Controlled Controlled 1. Chronic pain of lower extremity (Tertiary Area of Pain) (Bilateral) (L>R)   2. Chronic  Low back pain (Primary Area of Pain) (Bilateral)  (R>L)   3. Chronic hip pain (Secondary Area of Pain) (Bilateral) (L>R)   4. Chronic knee pain (Left)   5. Chronic pain syndrome     Problems updated and reviewed during this visit: No problems updated. Plan of Care  Pharmacotherapy (Medications Ordered): Meds ordered this encounter  Medications  . traMADol (ULTRAM) 50 MG tablet    Sig: Take 1 tablet (50 mg total) by mouth every 6 (six) hours as needed for severe pain.    Dispense:  120 tablet    Refill:  0    Fill one day early if pharmacy is closed on scheduled refill date. Do not fill until: 01/15/2017 To last until:02/14/2017    Order Specific Question:   Supervising Provider    Answer:   Milinda Pointer [phone]  . tiZANidine (ZANAFLEX) 4 MG tablet    Sig: Take 1 tablet (4 mg total) by mouth 3 (three) times daily.    Dispense:  90 tablet    Refill:  0    Do not place medication on "Automatic Refill". Fill one day early if pharmacy is closed on scheduled refill date.    Order Specific Question:   Supervising Provider    Answer:   Milinda Pointer [[id number]]  This SmartLink is deprecated. Use AVSMEDLIST instead to display the medication list for a patient. Medications administered today: Catheleen Langhorne had no medications administered during this visit. Lab-work, procedure(s), and/or referral(s): No orders of the defined types were placed in this encounter.  Imaging and/or referral(s): None  Interventional therapies: Planned, scheduled, and/or pending: Bilateral lumbar facet NB    Considering: Diagnosticbilateral lumbar epidural steroid injection Possible bilateral lumbar facet block RFA Diagnosticleft hip injection Diagnosticleft femoral nerve + obturator nerve block  Diagnostic left knee intra-articular steroid injection Diagnosticleft genicular nerve block Possible left knee RFA  Diagnosticcervical epidural steroid injection Diagnosticbilateral cervical  facet block Possiblebilateral cervical RFA      Provider-requested follow-up: Return in about 4 weeks (around 02/11/2017) for MedMgmt with Me Dionisio David).  Future Appointments  Date Time Provider [place]  01/17/2017  9:00 AM Milinda Pointer, MD [facility] None  01/24/2017  2:30 PM Lucilla Lame, MD [facility] None  02/11/2017  8:30 AM Dionisio David  Jerilynn Mages, NP [facility] None   Primary Care Physician: Cletis Athens, MD Location: [facility] Out[facility] Management Facility Note by: Vevelyn Francois NP Date: 01/14/2017; Time: 1:20 PM  Pain Score Disclaimer: We use the NRS-11 scale. This is a self-reported, subjective measurement of pain severity with only modest accuracy. It is used primarily to identify changes within a particular patient. It must be understood that outpatient pain scales are significantly less accurate that those used for research, where they can be applied under ideal controlled circumstances with minimal exposure to variables. In reality, the score is likely to be a combination of pain intensity and pain affect, where pain affect describes the degree of emotional arousal or changes in action readiness caused by the sensory experience of pain. Factors such as social and work situation, setting, emotional state, anxiety levels, expectation, and prior pain experience may influence pain perception and show large inter-individual differences that may also be affected by time variables.  Patient instructions provided during this appointment: Patient Instructions   ____________________________________________________________________________________________  Medication Rules  Applies to: All patients receiving prescriptions (written or electronic).  Pharmacy of record: Pharmacy where electronic prescriptions will be sent. If written prescriptions are taken to a different pharmacy, please inform the nursing staff. The pharmacy listed in the electronic medical record should be the  one where you would like electronic prescriptions to be sent.  Prescription refills: Only during scheduled appointments. Applies to both, written and electronic prescriptions.  NOTE: The following applies primarily to controlled substances (Opioid* Pain Medications).   Patient's responsibilities: 1. Pain Pills: Bring all pain pills to every appointment (except for procedure appointments). 2. Pill Bottles: Bring pills in original pharmacy bottle. Always bring newest bottle. Bring bottle, even if empty. 3. Medication refills: You are responsible for knowing and keeping track of what medications you need refilled. The day before your appointment, write a list of all prescriptions that need to be refilled. Bring that list to your appointment and give it to the admitting nurse. Prescriptions will be written only during appointments. If you forget a medication, it will not be "Called in", "Faxed", or "electronically sent". You will need to get another appointment to get these prescribed. 4. Prescription Accuracy: You are responsible for carefully inspecting your prescriptions before leaving our office. Have the discharge nurse carefully go over each prescription with you, before taking them home. Make sure that your name is accurately spelled, that your address is correct. Check the name and dose of your medication to make sure it is accurate. Check the number of pills, and the written instructions to make sure they are clear and accurate. Make sure that you are given enough medication to last until your next medication refill appointment. 5. Taking Medication: Take medication as prescribed. Never take more pills than instructed. Never take medication more frequently than prescribed. Taking less pills or less frequently is permitted and encouraged, when it comes to controlled substances (written prescriptions).  6. Inform other Doctors: Always inform, all of your healthcare providers, of all the medications  you take. 7. Pain Medication from other Providers: You are not allowed to accept any additional pain medication from any other Doctor or Healthcare provider. There are two exceptions to this rule. (see below) In the event that you require additional pain medication, you are responsible for notifying us, as stated below. 8. Medication Agreement: You are responsible for carefully reading and following our Medication Agreement. This must be signed before receiving any prescriptions from our practice. Safely  store a copy of your signed Agreement. Violations to the Agreement will result in no further prescriptions. (Additional copies of our Medication Agreement are available upon request.) 9. Laws, Rules, & Regulations: All patients are expected to follow all Federal and [employer], [employer], Rules, [employer]. Ignorance of the Laws does not constitute a valid excuse. The use of any illegal substances is prohibited. 10. Adopted CDC guidelines & recommendations: Target dosing levels will be at or below 60 MME/day. Use of benzodiazepines** is not recommended.  Exceptions: There are only two exceptions to the rule of not receiving pain medications from other Healthcare Providers. 1. Exception #1 (Emergencies): In the event of an emergency (i.e.: accident requiring emergency care), you are allowed to receive additional pain medication. However, you are responsible for: As soon as you are able, call our office [phone], at any time of the day or night, and leave a message stating your name, the date and nature of the emergency, and the name and dose of the medication prescribed. In the event that your call is answered by a member of our staff, make sure to document and save the date, time, and the name of the person that took your information.  2. Exception #2 (Planned Surgery): In the event that you are scheduled by another doctor or dentist to have any type of surgery or procedure, you are allowed (for a  period no longer than 30 days), to receive additional pain medication, for the acute post-op pain. However, in this case, you are responsible for picking up a copy of our "Post-op Pain Management for Surgeons" handout, and giving it to your surgeon or dentist. This document is available at our office, and does not require an appointment to obtain it. Simply go to our office during business hours (Monday-Thursday from 8:00 AM to 4:00 PM) (Friday 8:00 AM to 12:00 Noon) or if you have a scheduled appointment with [place], prior to your surgery, and ask for it by name. In addition, you will need to provide [place] with your name, name of your surgeon, type of surgery, and date of procedure or surgery.  *Opioid medications include: morphine, codeine, oxycodone, oxymorphone, hydrocodone, hydromorphone, meperidine, tramadol, tapentadol, buprenorphine, fentanyl, methadone. **Benzodiazepine medications include: diazepam (Valium), alprazolam (Xanax), clonazepam (Klonopine), lorazepam (Ativan), clorazepate (Tranxene), chlordiazepoxide (Librium), estazolam (Prosom), oxazepam (Serax), temazepam (Restoril), triazolam (Halcion)  ____________________________________________________________________________________________

## 2017-01-15 ENCOUNTER — Ambulatory Visit: Payer: Medicare HMO | Admitting: Gastroenterology

## 2017-01-15 DIAGNOSIS — H5213 Myopia, bilateral: Secondary | ICD-10-CM | POA: Diagnosis not present

## 2017-01-16 DIAGNOSIS — S92354A Nondisplaced fracture of fifth metatarsal bone, right foot, initial encounter for closed fracture: Secondary | ICD-10-CM | POA: Diagnosis not present

## 2017-01-16 NOTE — Progress Notes (Signed)
Patient's Name: Ashley Fields  MRN: [id number]  Referring Provider: Cletis Athens, MD  DOB: 12-Dec-1955  PCP: Cletis Athens, MD  DOS: 01/17/2017  Note by: Gaspar Cola, MD  Service setting: Ambulatory outpatient  Specialty: Interventional Pain Management  Patient type: Established  Location: [facility] (AMB) Pain Management Facility  Visit type: Interventional Procedure   Primary Reason for Visit: Interventional Pain Management Treatment. CC: Back Pain (low); Foot Pain (right); and Neck Pain  Procedure:  Anesthesia, Analgesia, Anxiolysis:  Procedure #1: Type: Diagnostic Medial Branch Facet Block #2 Region: Lumbar Level: L2, L3, L4, L5, & S1 Medial Branch Level(s) Laterality: Bilateral  Procedure #2: Type: Diagnostic Sacroiliac Joint Block #2 Region: Posterior Lumbosacral Level: PSIS (Posterior Superior Iliac Spine) Sacroiliac Joint Laterality: Bilateral  Type: Local Anesthesia with Moderate (Conscious) Sedation Local Anesthetic: Lidocaine 1% Route: Intravenous (IV) IV Access: Secured Sedation: Meaningful verbal contact was maintained at all times during the procedure  Indication(s): Analgesia and Anxiety   Indications: 1. Lumbar Facet Syndrome (Bilateral) (R>L)   2. Chronic sacroiliac joint pain (Bilateral) (R>L)   3. Chronic Low back pain (Primary Area of Pain) (Bilateral)  (R>L)   4. Lumbar facet osteoarthritis (Bilateral)   5. Facet syndrome, lumbar   6. Chronic sacroiliac joint pain   7. Chronic bilateral low back pain with bilateral sciatica   8. Osteoarthritis of facet joint of lumbar spine    Pain Score: Pre-procedure: 3 /10 Post-procedure: 0-No pain/10  Pre-op Assessment:  Ashley Fields is a 62 y.o. (year old), female patient, seen today for interventional treatment. She  has a past surgical history that includes Cesarean section; Abdominal hysterectomy; Breast reduction surgery; Cholecystectomy; Appendectomy; Gastric bypass (2005); spinal stimulator removed 2012 (2012);  acrominoplasty (2015); mini tummy tuck; breast lift bilateral, implants; Lumbar laminectomy (2008); 5 miscarriages; and neck surgery C3-C7. Ashley Fields has a current medication list which includes the following prescription(s): biotin, butalbital-acetaminophen-caffeine, estradiol, ferrous [id number] c-folic acid, ferrous sulfate, fluticasone, gabapentin, hydrocodone-acetaminophen, lorazepam, melatonin, multivitamin with minerals, omeprazole, sertraline, simvastatin, tizanidine, tramadol, trazodone, turmeric, and vitamin c, and the following Facility-Administered Medications: fentanyl and midazolam. Her primarily concern today is the Back Pain (low); Foot Pain (right); and Neck Pain  Initial Vital Signs: There were no vitals taken for this visit. BMI: Estimated body mass index is 32.19 kg/m as calculated from the following:   Height as of this encounter: 5\' 2"  (1.575 m).   Weight as of this encounter: 176 lb (79.8 kg).  Risk Assessment: Allergies: Reviewed. She is allergic to flagyl [metronidazole].  Allergy Precautions: None required Coagulopathies: Reviewed. None identified.  Blood-thinner therapy: None at this time Active Infection(s): Reviewed. None identified. Ashley Fields is afebrile  Site Confirmation: Ashley Fields was asked to confirm the procedure and laterality before marking the site Procedure checklist: Completed Consent: Before the procedure and under the influence of no sedative(s), amnesic(s), or anxiolytics, the patient was informed of the treatment options, risks and possible complications. To fulfill our ethical and legal obligations, as recommended by the American Medical Association's Code of Ethics, I have informed the patient of my clinical impression; the nature and purpose of the treatment or procedure; the risks, benefits, and possible complications of the intervention; the alternatives, including doing nothing; the risk(s) and benefit(s) of the alternative treatment(s)  or procedure(s); and the risk(s) and benefit(s) of doing nothing. The patient was provided information about the general risks and possible complications associated with the procedure. These may include, but are not limited to: failure to achieve desired goals,  infection, bleeding, organ or nerve damage, allergic reactions, paralysis, and death. In addition, the patient was informed of those risks and complications associated to Spine-related procedures, such as failure to decrease pain; infection (i.e.: Meningitis, epidural or intraspinal abscess); bleeding (i.e.: epidural hematoma, subarachnoid hemorrhage, or any other type of intraspinal or peri-dural bleeding); organ or nerve damage (i.e.: Any type of peripheral nerve, nerve root, or spinal cord injury) with subsequent damage to sensory, motor, and/or autonomic systems, resulting in permanent pain, numbness, and/or weakness of one or several areas of the body; allergic reactions; (i.e.: anaphylactic reaction); and/or death. Furthermore, the patient was informed of those risks and complications associated with the medications. These include, but are not limited to: allergic reactions (i.e.: anaphylactic or anaphylactoid reaction(s)); adrenal axis suppression; blood sugar elevation that in diabetics may result in ketoacidosis or comma; water retention that in patients with history of congestive heart failure may result in shortness of breath, pulmonary edema, and decompensation with resultant heart failure; weight gain; swelling or edema; medication-induced neural toxicity; particulate matter embolism and blood vessel occlusion with resultant organ, and/or nervous system infarction; and/or aseptic necrosis of one or more joints. Finally, the patient was informed that Medicine is not an exact science; therefore, there is also the possibility of unforeseen or unpredictable risks and/or possible complications that may result in a catastrophic outcome. The patient  indicated having understood very clearly. We have given the patient no guarantees and we have made no promises. Enough time was given to the patient to ask questions, all of which were answered to the patient's satisfaction. Ashley Fields has indicated that she wanted to continue with the procedure. Attestation: I, the ordering provider, attest that I have discussed with the patient the benefits, risks, side-effects, alternatives, likelihood of achieving goals, and potential problems during recovery for the procedure that I have provided informed consent. Date: 01/17/2017; Time: 7:45 AM  Pre-Procedure Preparation:  Monitoring: As per clinic protocol. Respiration, ETCO2, SpO2, BP, heart rate and rhythm monitor placed and checked for adequate function Safety Precautions: Patient was assessed for positional comfort and pressure points before starting the procedure. Time-out: I initiated and conducted the "Time-out" before starting the procedure, as per protocol. The patient was asked to participate by confirming the accuracy of the "Time Out" information. Verification of the correct person, site, and procedure were performed and confirmed by me, the nursing staff, and the patient. "Time-out" conducted as per Joint Commission's Universal Protocol (UP.01.01.01). "Time-out" Date & Time: 01/17/2017; 1049 hrs.  Description of Procedure #1 Process:   Time-out: "Time-out" completed before starting procedure, as per protocol. Position: Prone Target Area: For Lumbar Facet blocks, the target is the groove formed by the junction of the transverse process and superior articular process. For the L5 dorsal ramus, the target is the notch between superior articular process and sacral ala. For the S1 dorsal ramus, the target is the superior and lateral edge of the posterior S1 Sacral foramen. Approach: Paramedial approach. Area Prepped: Entire Posterior Lumbosacral Region Prepping solution: ChloraPrep (2% chlorhexidine  gluconate and 70% isopropyl alcohol) Safety Precautions: Aspiration looking for blood return was conducted prior to all injections. At no point did we inject any substances, as a needle was being advanced. No attempts were made at seeking any paresthesias. Safe injection practices and needle disposal techniques used. Medications properly checked for expiration dates. SDV (single dose vial) medications used.  Description of the Procedure: Protocol guidelines were followed. The patient was placed in position over the fluoroscopy  table. The target area was identified and the area prepped in the usual manner. Skin desensitized using vapocoolant spray. Skin & deeper tissues infiltrated with local anesthetic. Appropriate amount of time allowed to pass for local anesthetics to take effect. The procedure needle was introduced through the skin, ipsilateral to the reported pain, and advanced to the target area. Employing the "Medial Branch Technique", the needles were advanced to the angle made by the superior and medial portion of the transverse process, and the lateral and inferior portion of the superior articulating process of the targeted vertebral bodies. This area is known as "[place]'s Eye" or the "Eye of the [place] Dog". A procedure needle was introduced through the skin, and this time advanced to the angle made by the superior and medial border of the sacral ala, and the lateral border of the S1 vertebral body. This last needle was later repositioned at the superior and lateral border of the posterior S1 foramen. Negative aspiration confirmed. Solution injected in intermittent fashion, asking for systemic symptoms every 0.5cc of injectate. The needles were then removed and the area cleansed, making sure to leave some of the prepping solution back to take advantage of its long term bactericidal properties. Start Time: 1049 hrs. Materials:  Needle(s) Type: Regular needle Gauge: 22G Length:  3.5-in Medication(s): We administered midazolam, fentaNYL, lactated ringers, lidocaine, ropivacaine (PF) 2 mg/mL (0.2%), triamcinolone acetonide, ropivacaine (PF) 2 mg/mL (0.2%), triamcinolone acetonide, ropivacaine (PF) 2 mg/mL (0.2%), and methylPREDNISolone acetate. Please see chart orders for dosing details.  Description of Procedure # 2 Process:   Position: Prone Target Area: For upper sacroiliac joint block(s), the target is the superior and posterior margin of the sacroiliac joint. Approach: Ipsilateral approach. Area Prepped: Entire Posterior Lumbosacral Region Prepping solution: ChloraPrep (2% chlorhexidine gluconate and 70% isopropyl alcohol) Safety Precautions: Aspiration looking for blood return was conducted prior to all injections. At no point did we inject any substances, as a needle was being advanced. No attempts were made at seeking any paresthesias. Safe injection practices and needle disposal techniques used. Medications properly checked for expiration dates. SDV (single dose vial) medications used. Description of the Procedure: Protocol guidelines were followed. The patient was placed in position over the fluoroscopy table. The target area was identified and the area prepped in the usual manner. Skin desensitized using vapocoolant spray. Skin & deeper tissues infiltrated with local anesthetic. Appropriate amount of time allowed to pass for local anesthetics to take effect. The procedure needle was advanced under fluoroscopic guidance into the sacroiliac joint until a firm endpoint was obtained. Proper needle placement secured. Negative aspiration confirmed. Solution injected in intermittent fashion, asking for systemic symptoms every 0.5cc of injectate. The needles were then removed and the area cleansed, making sure to leave some of the prepping solution back to take advantage of its long term bactericidal properties. Vitals:   01/17/17 1112 01/17/17 1121 01/17/17 1131 01/17/17 1141   BP: 131/81 (!) 143/93 (!) 180/79 (!) 191/88  Pulse:      Resp: 12 (!) 7 (!) 9 12  Temp:  (!) 96.8 F (36 C)  (!) 97.3 F (36.3 C)  TempSrc:  Temporal    SpO2:      Weight:      Height:        End Time: 1102 hrs. Materials:  Needle(s) Type: Regular needle Gauge: 22G Length: 3.5-in Medication(s): We administered midazolam, fentaNYL, lactated ringers, lidocaine, ropivacaine (PF) 2 mg/mL (0.2%), triamcinolone acetonide, ropivacaine (PF) 2 mg/mL (0.2%), triamcinolone acetonide, ropivacaine (  PF) 2 mg/mL (0.2%), and methylPREDNISolone acetate. Please see chart orders for dosing details.  Imaging Guidance (Spinal):  Type of Imaging Technique: Fluoroscopy Guidance (Spinal) Indication(s): Assistance in needle guidance and placement for procedures requiring needle placement in or near specific anatomical locations not easily accessible without such assistance. Exposure Time: Please see nurses notes. Contrast: None used. Fluoroscopic Guidance: I was personally present during the use of fluoroscopy. "Tunnel Vision Technique" used to obtain the best possible view of the target area. Parallax error corrected before commencing the procedure. "Direction-depth-direction" technique used to introduce the needle under continuous pulsed fluoroscopy. Once target was reached, antero-posterior, oblique, and lateral fluoroscopic projection used confirm needle placement in all planes. Images permanently stored in EMR. Interpretation: No contrast injected. I personally interpreted the imaging intraoperatively. Adequate needle placement confirmed in multiple planes. Permanent images saved into the patient's record.  Antibiotic Prophylaxis:  Indication(s): None identified Antibiotic given: None  Post-operative Assessment:  EBL: None Complications: No immediate post-treatment complications observed by team, or reported by patient. Note: The patient tolerated the entire procedure well. A repeat set of vitals were  taken after the procedure and the patient was kept under observation following institutional policy, for this type of procedure. Post-procedural neurological assessment was performed, showing return to baseline, prior to discharge. The patient was provided with post-procedure discharge instructions, including a section on how to identify potential problems. Should any problems arise concerning this procedure, the patient was given instructions to immediately contact us, at any time, without hesitation. In any case, we plan to contact the patient by telephone for a follow-up status report regarding this interventional procedure. Comments:  No additional relevant information.  Plan of Care    Imaging Orders     DG C-Arm 1-60 Min-No Report  Procedure Orders     LUMBAR FACET(MEDIAL BRANCH NERVE BLOCK) MBNB     SACROILIAC JOINT INJECTION  Medications ordered for procedure: Meds ordered this encounter  Medications  . midazolam (VERSED) 5 MG/5ML injection 1-2 mg    Make sure Flumazenil is available in the pyxis when using this medication. If oversedation occurs, administer 0.2 mg IV over 15 sec. If after 45 sec no response, administer 0.2 mg again over 1 min; may repeat at 1 min intervals; not to exceed 4 doses (1 mg)  . fentaNYL (SUBLIMAZE) injection 25-50 mcg    Make sure Narcan is available in the pyxis when using this medication. In the event of respiratory depression (RR< 8/min): Titrate NARCAN (naloxone) in increments of 0.1 to 0.2 mg IV at 2-3 minute intervals, until desired degree of reversal.  . lactated ringers infusion 1,000 mL  . lidocaine (XYLOCAINE) 2 % (with pres) injection 200 mg  . ropivacaine (PF) 2 mg/mL (0.2%) (NAROPIN) injection 9 mL  . triamcinolone acetonide (KENALOG-40) injection 40 mg  . ropivacaine (PF) 2 mg/mL (0.2%) (NAROPIN) injection 9 mL  . triamcinolone acetonide (KENALOG-40) injection 40 mg  . ropivacaine (PF) 2 mg/mL (0.2%) (NAROPIN) injection 9 mL  .  methylPREDNISolone acetate (DEPO-MEDROL) injection 80 mg   Medications administered: We administered midazolam, fentaNYL, lactated ringers, lidocaine, ropivacaine (PF) 2 mg/mL (0.2%), triamcinolone acetonide, ropivacaine (PF) 2 mg/mL (0.2%), triamcinolone acetonide, ropivacaine (PF) 2 mg/mL (0.2%), and methylPREDNISolone acetate.  See the medical record for exact dosing, route, and time of administration.  New Prescriptions   No medications on file   Disposition: Discharge home  Discharge Date & Time: 01/17/2017; 1147 hrs.   Physician-requested Follow-up: Return for post-procedure eval (2 wks), w/  Dr. Dossie Arbour. Future Appointments  Date Time Provider [place]  01/24/2017  2:30 PM Lucilla Lame, MD [facility] None  02/04/2017  1:15 PM Milinda Pointer, MD [facility] None  02/11/2017  8:30 AM Vevelyn Francois, NP [facility] None   Primary Care Physician: Cletis Athens, MD Location: [facility] Outpatient Pain Management Facility Note by: Gaspar Cola, MD Date: 01/17/2017; Time: 12:42 PM  Disclaimer:  Medicine is not an Chief Strategy Officer. The only guarantee in medicine is that nothing is guaranteed. It is important to note that the decision to proceed with this intervention was based on the information collected from the patient. The Data and conclusions were drawn from the patient's questionnaire, the interview, and the physical examination. Because the information was provided in large part by the patient, it cannot be guaranteed that it has not been purposely or unconsciously manipulated. Every effort has been made to obtain as much relevant data as possible for this evaluation. It is important to note that the conclusions that lead to this procedure are derived in large part from the available data. Always take into account that the treatment will also be dependent on availability of resources and existing treatment guidelines, considered by other Pain Management Practitioners as being common  knowledge and practice, at the time of the intervention. For Medico-Legal purposes, it is also important to point out that variation in procedural techniques and pharmacological choices are the acceptable norm. The indications, contraindications, technique, and results of the above procedure should only be interpreted and judged by a Board-Certified Interventional Pain Specialist with extensive familiarity and expertise in the same exact procedure and technique.

## 2017-01-17 ENCOUNTER — Ambulatory Visit
Admission: RE | Admit: 2017-01-17 | Discharge: 2017-01-17 | Disposition: A | Discharge status: Home / Self Care | Payer: Medicare HMO | Source: Ambulatory Visit | Attending: Pain Medicine | Admitting: Pain Medicine

## 2017-01-17 ENCOUNTER — Ambulatory Visit (HOSPITAL_BASED_OUTPATIENT_CLINIC_OR_DEPARTMENT_OTHER): Payer: Medicare HMO | Admitting: Pain Medicine

## 2017-01-17 ENCOUNTER — Encounter: Payer: Self-pay | Admitting: Pain Medicine

## 2017-01-17 ENCOUNTER — Other Ambulatory Visit: Payer: Self-pay

## 2017-01-17 VITALS — BP 191/88 | HR 76 | Temp 97.3°F | Resp 12 | Ht 62.0 in | Wt 176.0 lb

## 2017-01-17 DIAGNOSIS — G8929 Other chronic pain: Secondary | ICD-10-CM | POA: Insufficient documentation

## 2017-01-17 DIAGNOSIS — M5441 Lumbago with sciatica, right side: Secondary | ICD-10-CM | POA: Diagnosis not present

## 2017-01-17 DIAGNOSIS — M542 Cervicalgia: Secondary | ICD-10-CM | POA: Diagnosis present

## 2017-01-17 DIAGNOSIS — M5442 Lumbago with sciatica, left side: Secondary | ICD-10-CM

## 2017-01-17 DIAGNOSIS — M47816 Spondylosis without myelopathy or radiculopathy, lumbar region: Secondary | ICD-10-CM | POA: Diagnosis not present

## 2017-01-17 DIAGNOSIS — Z90710 Acquired absence of both cervix and uterus: Secondary | ICD-10-CM | POA: Insufficient documentation

## 2017-01-17 DIAGNOSIS — Z9884 Bariatric surgery status: Secondary | ICD-10-CM | POA: Insufficient documentation

## 2017-01-17 DIAGNOSIS — M533 Sacrococcygeal disorders, not elsewhere classified: Secondary | ICD-10-CM

## 2017-01-17 DIAGNOSIS — Z9049 Acquired absence of other specified parts of digestive tract: Secondary | ICD-10-CM | POA: Diagnosis not present

## 2017-01-17 DIAGNOSIS — Z9882 Breast implant status: Secondary | ICD-10-CM | POA: Insufficient documentation

## 2017-01-17 DIAGNOSIS — M545 Low back pain: Secondary | ICD-10-CM | POA: Diagnosis present

## 2017-01-17 MED ORDER — ROPIVACAINE HCL 2 MG/ML IJ SOLN
9.0000 mL | Freq: Once | INTRAMUSCULAR | Status: AC
  Administered 2017-01-17: 10 mL via PERINEURAL
  Filled 2017-01-17: qty 10

## 2017-01-17 MED ORDER — LACTATED RINGERS IV SOLN
1000.0000 mL | Freq: Once | INTRAVENOUS | Status: AC
  Administered 2017-01-17: 1000 mL via INTRAVENOUS

## 2017-01-17 MED ORDER — TRIAMCINOLONE ACETONIDE 40 MG/ML IJ SUSP
40.0000 mg | Freq: Once | INTRAMUSCULAR | Status: AC
  Administered 2017-01-17: 40 mg
  Filled 2017-01-17: qty 1

## 2017-01-17 MED ORDER — METHYLPREDNISOLONE ACETATE 80 MG/ML IJ SUSP
80.0000 mg | Freq: Once | INTRAMUSCULAR | Status: AC
  Administered 2017-01-17: 80 mg via INTRA_ARTICULAR
  Filled 2017-01-17: qty 1

## 2017-01-17 MED ORDER — LIDOCAINE HCL 2 % IJ SOLN
10.0000 mL | Freq: Once | INTRAMUSCULAR | Status: AC
  Administered 2017-01-17: 400 mg
  Filled 2017-01-17: qty 40

## 2017-01-17 MED ORDER — ROPIVACAINE HCL 2 MG/ML IJ SOLN
9.0000 mL | Freq: Once | INTRAMUSCULAR | Status: AC
  Administered 2017-01-17: 10 mL via INTRA_ARTICULAR
  Filled 2017-01-17: qty 10

## 2017-01-17 MED ORDER — FENTANYL CITRATE (PF) 100 MCG/2ML IJ SOLN
25.0000 ug | INTRAMUSCULAR | Status: DC | PRN
  Administered 2017-01-17: 100 ug via INTRAVENOUS
  Filled 2017-01-17: qty 2

## 2017-01-17 MED ORDER — MIDAZOLAM HCL 5 MG/5ML IJ SOLN
1.0000 mg | INTRAMUSCULAR | Status: DC | PRN
  Administered 2017-01-17: 5 mg via INTRAVENOUS
  Filled 2017-01-17: qty 5

## 2017-01-17 NOTE — Progress Notes (Signed)
Safety precautions to be maintained throughout the outpatient stay will include: orient to surroundings, keep bed in low position, maintain call bell within reach at all times, provide assistance with transfer out of bed and ambulation.  

## 2017-01-17 NOTE — Patient Instructions (Signed)

## 2017-01-18 ENCOUNTER — Encounter: Payer: Self-pay | Admitting: Pain Medicine

## 2017-01-18 ENCOUNTER — Telehealth: Payer: Self-pay

## 2017-01-18 NOTE — Telephone Encounter (Signed)
Denies any needs at this time. Instructed to call if needed. Pt was concerned about follow up appt

## 2017-01-24 ENCOUNTER — Other Ambulatory Visit: Payer: Self-pay

## 2017-01-24 ENCOUNTER — Encounter: Payer: Self-pay | Admitting: Gastroenterology

## 2017-01-24 ENCOUNTER — Ambulatory Visit: Payer: Medicare HMO | Admitting: Gastroenterology

## 2017-01-24 VITALS — BP 136/76 | HR 75 | Ht 62.0 in | Wt 174.0 lb

## 2017-01-24 DIAGNOSIS — G8929 Other chronic pain: Secondary | ICD-10-CM | POA: Diagnosis not present

## 2017-01-24 DIAGNOSIS — R1031 Right lower quadrant pain: Secondary | ICD-10-CM | POA: Diagnosis not present

## 2017-01-24 DIAGNOSIS — B1920 Unspecified viral hepatitis C without hepatic coma: Secondary | ICD-10-CM | POA: Diagnosis not present

## 2017-01-24 NOTE — Progress Notes (Signed)
Gastroenterology Consultation  Referring Provider:     Cletis Athens, MD Primary Care Physician:  Cletis Athens, MD Primary Gastroenterologist:  Dr. Allen Norris     Reason for Consultation:     Hepatitis C antibody positive        HPI:   Ashley Fields is a 62 y.o. y/o female referred for consultation & management of Hepatitis C antibody positive by Dr. Cletis Athens, MD.  This patient is a woman who was diagnosed in the past with hepatitis C.  The patient states she was treated and confirmed as a sustained viral response.  The patient recently had hepatitis C antibody came back positive with a viral load negative and was sent to me for evaluation.  The patient also reports some right lower abdominal pain.  She states it hurts when she pushes on the area but is not made worse with eating or drinking.  She also states that the right lower abdomen bulges out.  Past Medical History:  Diagnosis Date  . Allergy   . Disp fx of cuboid bone of right foot, init for clos fx 01/01/2017  . Hyperlipidemia   . Thyroid disease     Past Surgical History:  Procedure Laterality Date  . 5 miscarriages    . ABDOMINAL HYSTERECTOMY    . acrominoplasty  2015  . APPENDECTOMY    . breast lift bilateral, implants    . BREAST REDUCTION SURGERY    . CESAREAN SECTION    . CHOLECYSTECTOMY    . GASTRIC BYPASS  2005  . LUMBAR LAMINECTOMY  2008   l4-l5   . mini tummy tuck    . neck surgery C3-C7    . spinal stimulator removed 2012  2012    Prior to Admission medications   Medication Sig Start Date End Date Taking? Authorizing Provider  Biotin 10000 MCG TABS Take 10,000 mcg by mouth 1 day or 1 dose.   Yes [provider]  butalbital-acetaminophen-caffeine (FIORICET, ESGIC) 50-325-40 MG tablet TAKE 1 TABLET BY MOUTH EVERY NIGHT AS NEEDED FOR HEADACHE 07/26/16  Yes [provider]  estradiol (ESTRACE) 1 MG tablet Take 1 mg by mouth daily.   Yes [provider]  ferrous sulfate 325 (65 FE)  MG tablet Take 325 mg by mouth daily.   Yes [provider]  fluticasone (FLONASE) 50 MCG/ACT nasal spray 1 SPRAY IN EACH NOSTRIL ONCE A DAY NASALLY 30 DAYS 07/04/16  Yes [provider]  gabapentin (NEURONTIN) 300 MG capsule TAKE ONE CAPSULE BY MOUTH THREE times a day 07/18/16  Yes [provider]  LORazepam (ATIVAN) 0.5 MG tablet Take 0.5 mg by mouth 2 (two) times daily as needed. 09/07/16  Yes [provider]  Melatonin 10 MG CAPS Take 1 capsule by mouth at bedtime.   Yes [provider]  Multiple Vitamin (MULTIVITAMIN WITH MINERALS) TABS tablet Take 1 tablet by mouth daily.   Yes [provider]  omeprazole (PRILOSEC) 40 MG capsule Take 40 mg by mouth daily. 07/06/16  Yes [provider]  sertraline (ZOLOFT) 100 MG tablet Take 200 mg by mouth 2 (two) times daily.  07/18/16  Yes [provider]  simvastatin (ZOCOR) 20 MG tablet TAKE 1 TABLET IN THE EVENING ONCE A DAY ORALLY 90 DAYS 07/30/16  Yes [provider]  tiZANidine (ZANAFLEX) 4 MG tablet Take 1 tablet (4 mg total) by mouth 3 (three) times daily. 01/15/17 02/14/17 Yes Vevelyn Francois, NP  traMADol (ULTRAM) 50 MG  tablet Take 1 tablet (50 mg total) by mouth every 6 (six) hours as needed for severe pain. 01/15/17 02/14/17 Yes Vevelyn Francois, NP  traZODone (DESYREL) 150 MG tablet Take 150 mg by mouth at bedtime. 09/04/16  Yes [provider]  Turmeric 500 MG CAPS Take 2 capsules by mouth daily.   Yes [provider]  vitamin C (ASCORBIC ACID) 500 MG tablet Take 500 mg 2 (two) times daily by mouth.   Yes [provider]  ferrous [id number] C-folic acid (TRINSICON / FOLTRIN) capsule Take 1 capsule by mouth 3 (three) times daily after meals.    [provider]  HYDROcodone-acetaminophen (NORCO/VICODIN) 5-325 MG tablet Take 1 tablet by mouth every 6 (six) hours as needed for moderate pain.    [provider]  meloxicam (MOBIC)  7.5 MG tablet Take 7.5 mg by mouth 2 (two) times daily. 01/16/17   [provider]    Family History  Problem Relation Age of Onset  . COPD Mother   . Cancer Mother   . Heart disease Father      Social History   Tobacco Use  . Smoking status: Former Smoker    Years: 4.00  . Smokeless tobacco: Never Used  Substance Use Topics  . Alcohol use: No  . Drug use: No    Allergies as of 01/24/2017 - Review Complete 01/24/2017  Allergen Reaction Noted  . Flagyl [metronidazole] Anaphylaxis 08/14/2016    Review of Systems:    All systems reviewed and negative except where noted in HPI.   Physical Exam:  BP 136/76   Pulse 75   Ht 5\' 2"  (1.575 m)   Wt 174 lb (78.9 kg)   BMI 31.83 kg/m  No LMP recorded. Patient has had a hysterectomy. Psych:  Alert and cooperative. Normal mood and affect. General:   Alert,  Well-developed, well-nourished, pleasant and cooperative in NAD Head:  Normocephalic and atraumatic. Eyes:  Sclera clear, no icterus.   Conjunctiva pink. Ears:  Normal auditory acuity. Abdomen:  Normal bowel sounds.  No bruits.  Soft, Mild tenderness consistent with musculoskeletal pain on the right lower abdomen and non-distended without masses, hepatosplenomegaly or hernias noted.  No guarding or rebound tenderness.  Negative Carnett sign.   Rectal:  Deferred.  Msk:  Symmetrical without gross deformities.  Good, equal movement & strength bilaterally. Pulses:  Normal pulses noted. Extremities:  No clubbing or edema.  No cyanosis. Neurologic:  Alert and oriented x3;  grossly normal neurologically. Skin:  Intact without significant lesions or rashes.  No jaundice. Lymph Nodes:  No significant cervical adenopathy. Psych:  Alert and cooperative. Normal mood and affect.  Imaging Studies: Dg C-arm 1-60 Min-no Report  Result Date: 01/17/2017 Fluoroscopy was utilized by the requesting physician.  No radiographic interpretation.    Assessment and Plan:   Ashley Fields is  a 62 y.o. y/o female who comes in today with a history of being treated for hepatitis C. The patient was found to have hepatitis C antibody positive with the viral load being negative.  The patient will always likely be antibody positive despite clearing the virus.  There is no need to continue testing for the antibody since she was already treated and resolved. The patient's abdominal pain in the right side is consistent with musculoskeletal pain and not associated with any GI symptoms.  The patient has been explained the plan and agrees with it.  Lucilla Lame, MD. Marval Regal   Note: This dictation was prepared with  Dragon dictation along with smaller Company secretary. Any transcriptional errors that result from this process are unintentional.

## 2017-02-01 DIAGNOSIS — E1169 Type 2 diabetes mellitus with other specified complication: Secondary | ICD-10-CM | POA: Diagnosis not present

## 2017-02-01 DIAGNOSIS — E119 Type 2 diabetes mellitus without complications: Secondary | ICD-10-CM | POA: Diagnosis not present

## 2017-02-01 DIAGNOSIS — M5440 Lumbago with sciatica, unspecified side: Secondary | ICD-10-CM | POA: Diagnosis not present

## 2017-02-01 DIAGNOSIS — M489 Spondylopathy, unspecified: Secondary | ICD-10-CM | POA: Diagnosis not present

## 2017-02-04 ENCOUNTER — Ambulatory Visit: Payer: Medicare HMO | Admitting: Pain Medicine

## 2017-02-11 ENCOUNTER — Ambulatory Visit: Payer: Medicare HMO | Attending: Nurse Practitioner | Admitting: Nurse Practitioner

## 2017-02-11 ENCOUNTER — Encounter: Payer: Self-pay | Admitting: Nurse Practitioner

## 2017-02-11 ENCOUNTER — Other Ambulatory Visit: Payer: Self-pay

## 2017-02-11 VITALS — BP 123/64 | HR 74 | Temp 98.1°F | Resp 16 | Ht 62.0 in | Wt 174.0 lb

## 2017-02-11 DIAGNOSIS — M25562 Pain in left knee: Secondary | ICD-10-CM | POA: Diagnosis not present

## 2017-02-11 DIAGNOSIS — M4316 Spondylolisthesis, lumbar region: Secondary | ICD-10-CM | POA: Insufficient documentation

## 2017-02-11 DIAGNOSIS — M47816 Spondylosis without myelopathy or radiculopathy, lumbar region: Secondary | ICD-10-CM

## 2017-02-11 DIAGNOSIS — Z79891 Long term (current) use of opiate analgesic: Secondary | ICD-10-CM

## 2017-02-11 DIAGNOSIS — M1610 Unilateral primary osteoarthritis, unspecified hip: Secondary | ICD-10-CM | POA: Diagnosis not present

## 2017-02-11 DIAGNOSIS — G8929 Other chronic pain: Secondary | ICD-10-CM | POA: Diagnosis not present

## 2017-02-11 DIAGNOSIS — M5442 Lumbago with sciatica, left side: Secondary | ICD-10-CM

## 2017-02-11 DIAGNOSIS — M5136 Other intervertebral disc degeneration, lumbar region: Secondary | ICD-10-CM | POA: Diagnosis not present

## 2017-02-11 DIAGNOSIS — Z9049 Acquired absence of other specified parts of digestive tract: Secondary | ICD-10-CM | POA: Insufficient documentation

## 2017-02-11 DIAGNOSIS — M533 Sacrococcygeal disorders, not elsewhere classified: Secondary | ICD-10-CM | POA: Diagnosis not present

## 2017-02-11 DIAGNOSIS — Z881 Allergy status to other antibiotic agents status: Secondary | ICD-10-CM | POA: Diagnosis not present

## 2017-02-11 DIAGNOSIS — Z9884 Bariatric surgery status: Secondary | ICD-10-CM | POA: Diagnosis not present

## 2017-02-11 DIAGNOSIS — Z809 Family history of malignant neoplasm, unspecified: Secondary | ICD-10-CM | POA: Diagnosis not present

## 2017-02-11 DIAGNOSIS — Z825 Family history of asthma and other chronic lower respiratory diseases: Secondary | ICD-10-CM | POA: Insufficient documentation

## 2017-02-11 DIAGNOSIS — N644 Mastodynia: Secondary | ICD-10-CM | POA: Insufficient documentation

## 2017-02-11 DIAGNOSIS — Z8249 Family history of ischemic heart disease and other diseases of the circulatory system: Secondary | ICD-10-CM | POA: Diagnosis not present

## 2017-02-11 DIAGNOSIS — Z79899 Other long term (current) drug therapy: Secondary | ICD-10-CM | POA: Insufficient documentation

## 2017-02-11 DIAGNOSIS — M5441 Lumbago with sciatica, right side: Secondary | ICD-10-CM

## 2017-02-11 DIAGNOSIS — Z981 Arthrodesis status: Secondary | ICD-10-CM | POA: Insufficient documentation

## 2017-02-11 DIAGNOSIS — G894 Chronic pain syndrome: Secondary | ICD-10-CM | POA: Diagnosis not present

## 2017-02-11 DIAGNOSIS — E785 Hyperlipidemia, unspecified: Secondary | ICD-10-CM | POA: Diagnosis not present

## 2017-02-11 DIAGNOSIS — M5030 Other cervical disc degeneration, unspecified cervical region: Secondary | ICD-10-CM | POA: Insufficient documentation

## 2017-02-11 DIAGNOSIS — M79605 Pain in left leg: Secondary | ICD-10-CM | POA: Diagnosis not present

## 2017-02-11 DIAGNOSIS — Z87891 Personal history of nicotine dependence: Secondary | ICD-10-CM | POA: Insufficient documentation

## 2017-02-11 DIAGNOSIS — M79604 Pain in right leg: Secondary | ICD-10-CM

## 2017-02-11 MED ORDER — TIZANIDINE HCL 4 MG PO TABS
4.0000 mg | ORAL_TABLET | Freq: Three times a day (TID) | ORAL | 0 refills | Status: DC
Start: 2017-02-14 — End: 2017-03-11

## 2017-02-11 MED ORDER — TRAMADOL HCL 50 MG PO TABS: 50.0000 mg | ORAL_TABLET | Freq: Four times a day (QID) | ORAL | 0 refills | Status: DC | PRN

## 2017-02-11 NOTE — Progress Notes (Signed)
Nursing Pain Medication Assessment:  Safety precautions to be maintained throughout the outpatient stay will include: orient to surroundings, keep bed in low position, maintain call bell within reach at all times, provide assistance with transfer out of bed and ambulation.  Medication Inspection Compliance: Pill count conducted under aseptic conditions, in front of the patient. Neither the pills nor the bottle was removed from the patient's sight at any time. Once count was completed pills were immediately returned to the patient in their original bottle.  Medication: See above Pill/Patch Count: 14 of 120 pills remain Pill/Patch Appearance: Markings consistent with prescribed medication Bottle Appearance: Standard pharmacy container. Clearly labeled. Filled Date: 1 / 8 / 2018 Last Medication intake:  Today

## 2017-02-11 NOTE — Progress Notes (Addendum)
Patient's Name: Ashley Fields  MRN: [id number]  Referring Provider: Cletis Athens, MD  DOB: [birth year]  PCP: Cletis Athens, MD  DOS: 02/11/2017  Note by: Vevelyn Francois NP  Service setting: Ambulatory outpatient  Specialty: Interventional Pain Management  Location: [facility] (AMB) Pain Management Facility    Patient type: Established    Primary Reason(s) for Visit: Encounter for prescription drug management & post-procedure evaluation of chronic illness with mild to moderate exacerbation(Level of risk: moderate) CC: Back Pain (lower); Knee Pain (left); and Breast Pain (left)  HPI  Ashley Fields is a 62 y.o. year old, female patient, who comes today for a post-procedure evaluation and medication management. She has Chronic pain syndrome; Chronic neck pain ; Chronic hip pain (Secondary Area of Pain) (Bilateral) (L>R); Long term current use of opiate analgesic; Long term prescription opiate use; Opiate use; Chronic knee pain (Left); Osteoarthritis of hip (Bilateral); Chronic Low back pain (Primary Area of Pain) (Bilateral)  (R>L); Chronic pain of lower extremity (Tertiary Area of Pain) (Bilateral) (L>R); Long term prescription benzodiazepine use; Grade 1 Anterolisthesis of L3 over L4; Failed back surgical syndrome; Chronic shoulder pain (Left); History of cervical fusion (ACDF C4-C7); DDD (degenerative disc disease), cervical; Chronic sacroiliac joint pain (Bilateral) (R>L); Lumbar Facet Syndrome (Bilateral) (R>L); Lumbar facet osteoarthritis (Bilateral); and DDD (degenerative disc disease), lumbar on their problem list. Her primarily concern today is the Back Pain (lower); Knee Pain (left); and Breast Pain (left)  Pain Assessment: Location: Lower Back Radiating: buttocks bilateral Onset: More than a month ago Duration: Chronic pain Quality: Aching, Sharp, Discomfort(immobling) Severity: 4 /10 (self-reported pain score)  Note: Reported level is compatible with observation.                          Effect on  ADL: prolonged standing and straightening up Timing: Constant Modifying factors: rest, medication sometimes  Ashley Fields was last seen on 01/17/2017 for a procedure. During today's appointment we reviewed Ashley Fields's post-procedure results, as well as her outpatient medication regimen. She states that the procedure was not effective. She states that she is having increased pain. She admits that the last month has been "horrible". She states that the pain stays in her lower back and radiates across.  She admits that when her back is really hurting that she has urinary incontinence. She is having left knee numbness. She is no longer using Meloxicam secondary to feeling jittery and shaky. She admits that she dos use Advil.  Further details on both, my assessment(s), as well as the proposed treatment plan, please see below.  Controlled Substance Pharmacotherapy Assessment REMS (Risk Evaluation and Mitigation Strategy)  Analgesic:Tramadol 50 mg, 1 tab PO q6hrs (200 mg/day) MME/day:65m/day. GIgnatius Specking RN  02/11/2017  9:02 AM  Sign at close encounter Nursing Pain Medication Assessment:  Safety precautions to be maintained throughout the outpatient stay will include: orient to surroundings, keep bed in low position, maintain call bell within reach at all times, provide assistance with transfer out of bed and ambulation.  Medication Inspection Compliance: Pill count conducted under aseptic conditions, in front of the patient. Neither the pills nor the bottle was removed from the patient's sight at any time. Once count was completed pills were immediately returned to the patient in their original bottle.  Medication: See above Pill/Patch Count: 14 of 120 pills remain Pill/Patch Appearance: Markings consistent with prescribed medication Bottle Appearance: Standard pharmacy container. Clearly labeled. Filled Date: 1 / 8 /  2018 Last Medication intake:  Today   Pharmacokinetics: Liberation and  absorption (onset of action): WNL Distribution (time to peak effect): WNL Metabolism and excretion (duration of action): WNL         Pharmacodynamics: Desired effects: Analgesia: Ashley Fields reports >50% benefit. Functional ability: Patient reports that medication allows her to accomplish basic ADLs Clinically meaningful improvement in function (CMIF): Sustained CMIF goals met Perceived effectiveness: Described as relatively effective, allowing for increase in activities of daily living (ADL) Undesirable effects: Side-effects or Adverse reactions: None reported Monitoring: [place] PMP: Online review of the past 43-monthperiod conducted. Compliant with practice rules and regulations Last UDS on record: Summary  Date Value Ref Range Status  11/15/2016 FINAL  Final    Comment:    ==================================================================== TOXASSURE SELECT 13 (MW) ==================================================================== Test                             Result       Flag       Units Drug Present and Declared for Prescription Verification   Lorazepam                      138          EXPECTED   ng/mg creat    Source of lorazepam is a scheduled prescription medication.   Tramadol                       >6098        EXPECTED   ng/mg creat   O-Desmethyltramadol            >6098        EXPECTED   ng/mg creat   N-Desmethyltramadol            4685         EXPECTED   ng/mg creat    Source of tramadol is a prescription medication.    O-desmethyltramadol and N-desmethyltramadol are expected    metabolites of tramadol. Drug Absent but Declared for Prescription Verification   Butalbital                     Not Detected UNEXPECTED ==================================================================== Test                      Result    Flag   Units      Ref Range   Creatinine              82               mg/dL       >=20 ==================================================================== Declared Medications:  The flagging and interpretation on this report are based on the  following declared medications.  Unexpected results may arise from  inaccuracies in the declared medications.  **Note: The testing scope of this panel includes these medications:  Butalbital (Butalbital/APAP/Caffeine)  Lorazepam (Ativan)  Tramadol  **Note: The testing scope of this panel does not include following  reported medications:  Acetaminophen (Butalbital/APAP/Caffeine)  Caffeine (Butalbital/APAP/Caffeine)  Fluticasone (Flonase)  Gabapentin  Iron  Melatonin  Multivitamin  Omeprazole (Prilosec)  Sertraline (Zoloft)  Tizanidine (Zanaflex)  Trazodone  Turmeric  Vitamin B (Biotin)  Vitamin B12  Vitamin C ==================================================================== For clinical consultation, please call ([phone] ====================================================================    UDS interpretation: Compliant          Medication Assessment Form: Reviewed. Patient  indicates being compliant with therapy Treatment compliance: Compliant Risk Assessment Profile: Aberrant behavior: See prior evaluations. None observed or detected today Comorbid factors increasing risk of overdose: See prior notes. No additional risks detected today Risk of substance use disorder (SUD): Low Opioid Risk Tool - 02/11/17 0859      Family History of Substance Abuse   Alcohol  Negative    Illegal Drugs  Negative    Rx Drugs  Negative      Personal History of Substance Abuse   Alcohol  Negative    Illegal Drugs  Negative    Rx Drugs  Negative      Total Score   Opioid Risk Tool Scoring  0    Opioid Risk Interpretation  Low Risk      ORT Scoring interpretation table:  Score <3 = Low Risk for SUD  Score between 4-7 = Moderate Risk for SUD  Score >8 = High Risk for Opioid Abuse   Risk Mitigation Strategies:   Patient Counseling: Covered Patient-Prescriber Agreement (PPA): Present and active  Notification to other healthcare providers: Done  Pharmacologic Plan: No change in therapy, at this time.             Post-Procedure Assessment  01/17/2017 Procedure: Bilateral lumbar facet nerve block + bilateral sacroiliac joint injection Pre-procedure pain score:  3/10 Post-procedure pain score: 0/10         Influential Factors: BMI: 31.83 kg/m Intra-procedural challenges: None observed.         Assessment challenges: None detected.              Reported side-effects: None.        Post-procedural adverse reactions or complications: None reported         Sedation: Please see nurses note. When no sedatives are used, the analgesic levels obtained are directly associated to the effectiveness of the local anesthetics. However, when sedation is provided, the level of analgesia obtained during the initial 1 hour following the intervention, is believed to be the result of a combination of factors. These factors may include, but are not limited to: 1. The effectiveness of the local anesthetics used. 2. The effects of the analgesic(s) and/or anxiolytic(s) used. 3. The degree of discomfort experienced by the patient at the time of the procedure. 4. The patients ability and reliability in recalling and recording the events. 5. The presence and influence of possible secondary gains and/or psychosocial factors. Reported result: Relief experienced during the 1st hour after the procedure: 100 % (Ultra-Short Term Relief)            Interpretative annotation: Clinically appropriate result. Analgesia during this period is likely to be Local Anesthetic and/or IV Sedative (Analgesic/Anxiolytic) related.          Effects of local anesthetic: The analgesic effects attained during this period are directly associated to the localized infiltration of local anesthetics and therefore cary significant diagnostic value as to the  etiological location, or anatomical origin, of the pain. Expected duration of relief is directly dependent on the pharmacodynamics of the local anesthetic used. Long-acting (4-6 hours) anesthetics used.  Reported result: Relief during the next 4 to 6 hour after the procedure: 0 % (Short-Term Relief)            Interpretative annotation: Clinically appropriate result. Analgesia during this period is likely to be Local Anesthetic-related.          Long-term benefit: Defined as the period of time past the expected duration of  local anesthetics (1 hour for short-acting and 4-6 hours for long-acting). With the possible exception of prolonged sympathetic blockade from the local anesthetics, benefits during this period are typically attributed to, or associated with, other factors such as analgesic sensory neuropraxia, antiinflammatory effects, or beneficial biochemical changes provided by agents other than the local anesthetics.  Reported result: Extended relief following procedure: 0 % (Long-Term Relief)            Interpretative annotation: Clinically appropriate result. Good relief. No permanent benefit expected. Inflammation plays a part in the etiology to the pain.          Current benefits: Defined as reported results that persistent at this point in time.   Analgesia: 0 %            Function: No benefit ROM: No benefit Interpretative annotation: No benefit.    Results would suggest further treatment needed.          Interpretation: Results would suggest that repeating the procedure may be necessary,                  Plan:  Please see "Plan of Care" for details.        Laboratory Chemistry  Inflammation Markers (CRP: Acute Phase) (ESR: Chronic Phase) Lab Results  Component Value Date   CRP 0.7 08/14/2016   ESRSEDRATE 9 08/14/2016                 Rheumatology Markers No results found for: Elayne Guerin, [employer]              Renal Function Markers Lab Results   Component Value Date   BUN 17 08/14/2016   CREATININE 0.80 08/14/2016   GFRAA 93 08/14/2016   GFRNONAA 80 08/14/2016                 Hepatic Function Markers Lab Results  Component Value Date   AST 20 08/14/2016   ALT 10 08/14/2016   ALBUMIN 4.9 (H) 08/14/2016   ALKPHOS 103 08/14/2016                 Electrolytes Lab Results  Component Value Date   NA 139 08/14/2016   K 4.1 08/14/2016   CL 97 08/14/2016   CALCIUM 9.6 08/14/2016   MG 2.3 08/14/2016                 Neuropathy Markers Lab Results  Component Value Date   VITAMINB12 473 08/14/2016                 Bone Pathology Markers Lab Results  Component Value Date   25OHVITD1 47 08/14/2016   25OHVITD2 <1.0 08/14/2016   25OHVITD3 47 08/14/2016                 Coagulation Parameters No results found for: INR, LABPROT, APTT, PLT, DDIMER               Cardiovascular Markers No results found for: BNP, CKTOTAL, CKMB, TROPONINI, HGB, HCT               CA Markers No results found for: CEA, CA125, LABCA2               Note: Lab results reviewed.  Recent Diagnostic Imaging Results  DG C-Arm 1-60 Min-No Report Fluoroscopy was utilized by the requesting physician.  No radiographic  interpretation.   Complexity Note: Imaging results reviewed. Results shared with Ashley Fields, using Layman's  terms.                         Meds   Current Outpatient Medications:  .  Biotin 10000 MCG TABS, Take 10,000 mcg by mouth 1 day or 1 dose., Disp: , Rfl:  .  butalbital-acetaminophen-caffeine (FIORICET, ESGIC) 50-325-40 MG tablet, TAKE 1 TABLET BY MOUTH EVERY NIGHT AS NEEDED FOR HEADACHE, Disp: , Rfl: 2 .  estradiol (ESTRACE) 1 MG tablet, Take 1 mg by mouth daily., Disp: , Rfl:  .  ferrous sulfate 325 (65 FE) MG tablet, Take 325 mg by mouth daily., Disp: , Rfl:  .  fluticasone (FLONASE) 50 MCG/ACT nasal spray, 1 SPRAY IN EACH NOSTRIL ONCE A DAY NASALLY 30 DAYS, Disp: , Rfl: 3 .  gabapentin (NEURONTIN) 300 MG capsule, TAKE ONE  CAPSULE BY MOUTH THREE times a day, Disp: , Rfl: 1 .  LORazepam (ATIVAN) 0.5 MG tablet, Take 0.5 mg by mouth 2 (two) times daily as needed., Disp: , Rfl: 0 .  LORazepam (ATIVAN) 1 MG tablet, Take 1 mg by mouth 3 (three) times daily., Disp: , Rfl: 0 .  Melatonin 10 MG CAPS, Take 1 capsule by mouth at bedtime., Disp: , Rfl:  .  Multiple Vitamin (MULTIVITAMIN WITH MINERALS) TABS tablet, Take 1 tablet by mouth daily., Disp: , Rfl:  .  omeprazole (PRILOSEC) 40 MG capsule, Take 40 mg by mouth daily., Disp: , Rfl: 0 .  sertraline (ZOLOFT) 100 MG tablet, Take 200 mg by mouth 2 (two) times daily. , Disp: , Rfl: 0 .  simvastatin (ZOCOR) 20 MG tablet, TAKE 1 TABLET IN THE EVENING ONCE A DAY ORALLY 90 DAYS, Disp: , Rfl: 0 .  [START ON 02/14/2017] tiZANidine (ZANAFLEX) 4 MG tablet, Take 1 tablet (4 mg total) by mouth 3 (three) times daily., Disp: 90 tablet, Rfl: 0 .  [START ON 02/14/2017] traMADol (ULTRAM) 50 MG tablet, Take 1 tablet (50 mg total) by mouth every 6 (six) hours as needed for severe pain., Disp: 120 tablet, Rfl: 0 .  traZODone (DESYREL) 150 MG tablet, Take 150 mg by mouth at bedtime., Disp: , Rfl: 0 .  Turmeric 500 MG CAPS, Take 2 capsules by mouth daily., Disp: , Rfl:  .  vitamin C (ASCORBIC ACID) 500 MG tablet, Take 500 mg 2 (two) times daily by mouth., Disp: , Rfl:   ROS  Constitutional: Denies any fever or chills Gastrointestinal: No reported hemesis, hematochezia, vomiting, or acute GI distress Musculoskeletal: Denies any acute onset joint swelling, redness, loss of ROM, or weakness Neurological: No reported episodes of acute onset apraxia, aphasia, dysarthria, agnosia, amnesia, paralysis, loss of coordination, or loss of consciousness  Allergies  Ashley Fields is allergic to flagyl [metronidazole].  PFSH  Drug: Ashley Fields  reports that she does not use drugs. Alcohol:  reports that she does not drink alcohol. Tobacco:  reports that she has quit smoking. She quit after 4.00 years of use. she  has never used smokeless tobacco. Medical:  has a past medical history of Allergy, Disp fx of cuboid bone of right foot, init for clos fx (01/01/2017), Foot fracture, right, Hyperlipidemia, and Thyroid disease. Surgical: Ashley Fields  has a past surgical history that includes Cesarean section; Abdominal hysterectomy; Breast reduction surgery; Cholecystectomy; Appendectomy; Gastric bypass (2005); spinal stimulator removed 2012 (2012); acrominoplasty (2015); mini tummy tuck; breast lift bilateral, implants; Lumbar laminectomy (2008); 5 miscarriages; and neck surgery C3-C7. Family: family history includes COPD in her mother; Cancer  in her mother; Heart disease in her father.  Constitutional Exam  General appearance: Well nourished, well developed, and well hydrated. In no apparent acute distress Vitals:   02/11/17 0843  BP: 123/64  Pulse: 74  Resp: 16  Temp: 98.1 F (36.7 C)  SpO2: 97%  Weight: 174 lb (78.9 kg)  Height: 5' 2"  (1.575 m)  Psych/Mental status: Alert, oriented x 3 (person, place, & time)       Eyes: PERLA Respiratory: No evidence of acute respiratory distress  Lumbar Spine Area Exam  Skin & Axial Inspection: No masses, redness, or swelling Alignment: Symmetrical Functional ROM: Unrestricted ROM      Stability: No instability detected Muscle Tone/Strength: Functionally intact. No obvious neuro-muscular anomalies detected. Sensory (Neurological): Unimpaired Palpation: Complains of area being tender to palpation       Provocative Tests: Lumbar Hyperextension and rotation test: Positive bilaterally for facet joint pain. Lumbar Lateral bending test: evaluation deferred today       Patrick's Maneuver: evaluation deferred today                    Gait & Posture Assessment  Ambulation: Patient ambulates using a cane Gait: Relatively normal for age and body habitus Posture: WNL   Lower Extremity Exam    Side: Right lower extremity  Side: Left lower extremity  Skin & Extremity  Inspection: Skin color, temperature, and hair growth are WNL. No peripheral edema or cyanosis. No masses, redness, swelling, asymmetry, or associated skin lesions. No contractures.  Skin & Extremity Inspection: Edema  Functional ROM: Unrestricted ROM          Functional ROM: Adequate ROM          Muscle Tone/Strength: Functionally intact. No obvious neuro-muscular anomalies detected.  Muscle Tone/Strength: Functionally intact. No obvious neuro-muscular anomalies detected.  Sensory (Neurological): Unimpaired  Sensory (Neurological): Unimpaired  Palpation: No palpable anomalies  Palpation: Complains of area being tender to palpation   Assessment  Primary Diagnosis & Pertinent Problem List: The primary encounter diagnosis was Chronic Low back pain (Primary Area of Pain) (Bilateral)  (R>L). Diagnoses of Chronic pain of lower extremity (Tertiary Area of Pain) (Bilateral) (L>R), Lumbar Facet Syndrome (Bilateral) (R>L), Lumbar facet osteoarthritis (Bilateral), Chronic pain syndrome, Long term prescription opiate use, and Chronic sacroiliac joint pain (Bilateral) (R>L) were also pertinent to this visit.  Status Diagnosis  Recurring Persistent Persistent 1. Chronic Low back pain (Primary Area of Pain) (Bilateral)  (R>L)   2. Chronic pain of lower extremity (Tertiary Area of Pain) (Bilateral) (L>R)   3. Lumbar Facet Syndrome (Bilateral) (R>L)   4. Lumbar facet osteoarthritis (Bilateral)   5. Chronic pain syndrome   6. Long term prescription opiate use   7. Chronic sacroiliac joint pain (Bilateral) (R>L)     Problems updated and reviewed during this visit: Problem  Lumbar facet osteoarthritis (Bilateral)  Ddd (Degenerative Disc Disease), Lumbar  Lumbar Facet Syndrome (Bilateral) (R>L)  Grade 1 Anterolisthesis of L3 over L4  Failed Back Surgical Syndrome   L4-5 Fusion   Chronic shoulder pain (Left)  History of cervical fusion (ACDF C4-C7)  Ddd (Degenerative Disc Disease), Cervical  Chronic  sacroiliac joint pain (Bilateral) (R>L)   Plan of Care  Pharmacotherapy (Medications Ordered): Meds ordered this encounter  Medications  . tiZANidine (ZANAFLEX) 4 MG tablet    Sig: Take 1 tablet (4 mg total) by mouth 3 (three) times daily.    Dispense:  90 tablet    Refill:  0  Do not place medication on "Automatic Refill". Fill one day early if pharmacy is closed on scheduled refill date.    Order Specific Question:   Supervising Provider    Answer:   Milinda Pointer [phone]  . traMADol (ULTRAM) 50 MG tablet    Sig: Take 1 tablet (50 mg total) by mouth every 6 (six) hours as needed for severe pain.    Dispense:  120 tablet    Refill:  0    Fill one day early if pharmacy is closed on scheduled refill date. Do not fill until: 02/14/2017 To last until:03/16/2017    Order Specific Question:   Supervising Provider    Answer:   Milinda Pointer [phone]   New Prescriptions   No medications on file   Medications administered today: Quynn Fields had no medications administered during this visit. Lab-work, procedure(s), and/or referral(s): Orders Placed This Encounter  Procedures  . LUMBAR FACET(MEDIAL BRANCH NERVE BLOCK) MBNB  . SACROILIAC JOINT INJECTION  . MR LUMBAR SPINE WO CONTRAST  . ToxASSURE Select 13 (MW), Urine   Imaging and/or referral(s): MR LUMBAR SPINE WO CONTRAST  Interventional therapies: Planned, scheduled, and/or pending: Diagnostic bilateral lumbar facet block + BL SI joint injection   Considering: Diagnosticbilateral lumbar epidural steroid injection Diagnostic bilateral lumbar facet block  Possible Bilateral lumbar facet NBRFA Diagnosticleft hip injection Diagnosticleft femoral nerve + obturator nerve block  Diagnostic left knee intra-articular steroid injection Diagnosticleft genicular nerve block Possible left knee RFA  Diagnosticcervical epidural steroid injection Diagnosticbilateral cervical facet block Possiblebilateral cervical  RFA   Provider-requested follow-up: Return in about 4 weeks (around 03/11/2017) for MedMgmt with Me Dionisio David), in addition, w/ Dr. Dossie Arbour, Procedure(w/Sedation), (ASAA), MRI.  Future Appointments  Date Time Provider [place]  03/11/2017  9:00 AM Vevelyn Francois, NP [facility] None   Primary Care Physician: Cletis Athens, MD Location: [facility] Facility Note by: Vevelyn Francois NP Date: 02/11/2017; Time: 2:28 PM  Pain Score Disclaimer: We use the NRS-11 scale. This is a self-reported, subjective measurement of pain severity with only modest accuracy. It is used primarily to identify changes within a particular patient. It must be understood that outpatient pain scales are significantly less accurate that those used for research, where they can be applied under ideal controlled circumstances with minimal exposure to variables. In reality, the score is likely to be a combination of pain intensity and pain affect, where pain affect describes the degree of emotional arousal or changes in action readiness caused by the sensory experience of pain. Factors such as social and work situation, setting, emotional state, anxiety levels, expectation, and prior pain experience may influence pain perception and show large inter-individual differences that may also be affected by time variables.  Patient instructions provided during this appointment: Patient Instructions    ____________________________________________________________________________________________  Medication Rules  Applies to: All patients receiving prescriptions (written or electronic).  Pharmacy of record: Pharmacy where electronic prescriptions will be sent. If written prescriptions are taken to a different pharmacy, please inform the nursing staff. The pharmacy listed in the electronic medical record should be the one where you would like electronic prescriptions to be sent.  Prescription refills: Only  during scheduled appointments. Applies to both, written and electronic prescriptions.  NOTE: The following applies primarily to controlled substances (Opioid* Pain Medications).   Patient's responsibilities: 1. Pain Pills: Bring all pain pills to every appointment (except for procedure appointments). 2. Pill Bottles: Bring pills in original pharmacy bottle. Always bring newest  bottle. Bring bottle, even if empty. 3. Medication refills: You are responsible for knowing and keeping track of what medications you need refilled. The day before your appointment, write a list of all prescriptions that need to be refilled. Bring that list to your appointment and give it to the admitting nurse. Prescriptions will be written only during appointments. If you forget a medication, it will not be "Called in", "Faxed", or "electronically sent". You will need to get another appointment to get these prescribed. 4. Prescription Accuracy: You are responsible for carefully inspecting your prescriptions before leaving our office. Have the discharge nurse carefully go over each prescription with you, before taking them home. Make sure that your name is accurately spelled, that your address is correct. Check the name and dose of your medication to make sure it is accurate. Check the number of pills, and the written instructions to make sure they are clear and accurate. Make sure that you are given enough medication to last until your next medication refill appointment. 5. Taking Medication: Take medication as prescribed. Never take more pills than instructed. Never take medication more frequently than prescribed. Taking less pills or less frequently is permitted and encouraged, when it comes to controlled substances (written prescriptions).  6. Inform other Doctors: Always inform, all of your healthcare providers, of all the medications you take. 7. Pain Medication from other Providers: You are not allowed to accept any  additional pain medication from any other Doctor or Healthcare provider. There are two exceptions to this rule. (see below) In the event that you require additional pain medication, you are responsible for notifying us, as stated below. 8. Medication Agreement: You are responsible for carefully reading and following our Medication Agreement. This must be signed before receiving any prescriptions from our practice. Safely store a copy of your signed Agreement. Violations to the Agreement will result in no further prescriptions. (Additional copies of our Medication Agreement are available upon request.) 9. Laws, Rules, & Regulations: All patients are expected to follow all Federal and [employer], [employer], Rules, [facility]. Ignorance of the Laws does not constitute a valid excuse. The use of any illegal substances is prohibited. 10. Adopted CDC guidelines & recommendations: Target dosing levels will be at or below 60 MME/day. Use of benzodiazepines** is not recommended.  Exceptions: There are only two exceptions to the rule of not receiving pain medications from other Healthcare Providers. 1. Exception #1 (Emergencies): In the event of an emergency (i.e.: accident requiring emergency care), you are allowed to receive additional pain medication. However, you are responsible for: As soon as you are able, call our office [phone], at any time of the day or night, and leave a message stating your name, the date and nature of the emergency, and the name and dose of the medication prescribed. In the event that your call is answered by a member of our staff, make sure to document and save the date, time, and the name of the person that took your information.  2. Exception #2 (Planned Surgery): In the event that you are scheduled by another doctor or dentist to have any type of surgery or procedure, you are allowed (for a period no longer than 30 days), to receive additional pain medication, for the acute  post-op pain. However, in this case, you are responsible for picking up a copy of our "Post-op Pain Management for Surgeons" handout, and giving it to your surgeon or dentist. This document is available at our office,  and does not require an appointment to obtain it. Simply go to our office during business hours (Monday-Thursday from 8:00 AM to 4:00 PM) (Friday 8:00 AM to 12:00 Noon) or if you have a scheduled appointment with [place], prior to your surgery, and ask for it by name. In addition, you will need to provide [place] with your name, name of your surgeon, type of surgery, and date of procedure or surgery.  *Opioid medications include: morphine, codeine, oxycodone, oxymorphone, hydrocodone, hydromorphone, meperidine, tramadol, tapentadol, buprenorphine, fentanyl, methadone. **Benzodiazepine medications include: diazepam (Valium), alprazolam (Xanax), clonazepam (Klonopine), lorazepam (Ativan), clorazepate (Tranxene), chlordiazepoxide (Librium), estazolam (Prosom), oxazepam (Serax), temazepam (Restoril), triazolam (Halcion)  ____________________________________________________________________________________________   BMI Assessment: Estimated body mass index is 31.83 kg/m as calculated from the following:   Height as of this encounter: 5' 2"  (1.575 m).   Weight as of this encounter: 174 lb (78.9 kg).  BMI interpretation table: BMI level Category Range association with higher incidence of chronic pain  <18 kg/m2 Underweight   18.5-24.9 kg/m2 Ideal body weight   25-29.9 kg/m2 Overweight Increased incidence by 20%  30-34.9 kg/m2 Obese (Class I) Increased incidence by 68%  35-39.9 kg/m2 Severe obesity (Class II) Increased incidence by 136%  >40 kg/m2 Extreme obesity (Class III) Increased incidence by 254%   BMI Readings from Last 4 Encounters:  02/11/17 31.83 kg/m  01/24/17 31.83 kg/m  01/17/17 32.19 kg/m  01/14/17 32.19 kg/m   Wt Readings from Last 4 Encounters:  02/11/17 174 lb (78.9 kg)    01/24/17 174 lb (78.9 kg)  01/17/17 176 lb (79.8 kg)  01/14/17 176 lb (79.8 kg)  GENERAL RISKS AND COMPLICATIONS  What are the risk, side effects and possible complications? Generally speaking, most procedures are safe.  However, with any procedure there are risks, side effects, and the possibility of complications.  The risks and complications are dependent upon the sites that are lesioned, or the type of nerve block to be performed.  The closer the procedure is to the spine, the more serious the risks are.  Great care is taken when placing the radio frequency needles, block needles or lesioning probes, but sometimes complications can occur. 1. Infection: Any time there is an injection through the skin, there is a risk of infection.  This is why sterile conditions are used for these blocks.  There are four possible types of infection. 1. Localized skin infection. 2. Central Nervous System Infection-This can be in the form of Meningitis, which can be deadly. 3. Epidural Infections-This can be in the form of an epidural abscess, which can cause pressure inside of the spine, causing compression of the spinal cord with subsequent paralysis. This would require an emergency surgery to decompress, and there are no guarantees that the patient would recover from the paralysis. 4. Discitis-This is an infection of the intervertebral discs.  It occurs in about 1% of discography procedures.  It is difficult to treat and it may lead to surgery.        2. Pain: the needles have to go through skin and soft tissues, will cause soreness.       3. Damage to internal structures:  The nerves to be lesioned may be near blood vessels or    other nerves which can be potentially damaged.       4. Bleeding: Bleeding is more common if the patient is taking blood thinners such as  aspirin, Coumadin, Ticiid, Plavix, etc., or if he/she have some genetic predisposition  such as hemophilia.  Bleeding into the spinal canal can  cause compression of the spinal  cord with subsequent paralysis.  This would require an emergency surgery to  decompress and there are no guarantees that the patient would recover from the  paralysis.       5. Pneumothorax:  Puncturing of a lung is a possibility, every time a needle is introduced in  the area of the chest or upper back.  Pneumothorax refers to free air around the  collapsed lung(s), inside of the thoracic cavity (chest cavity).  Another two possible  complications related to a similar event would include: Hemothorax and Chylothorax.   These are variations of the Pneumothorax, where instead of air around the collapsed  lung(s), you may have blood or chyle, respectively.       6. Spinal headaches: They may occur with any procedures in the area of the spine.       7. Persistent CSF (Cerebro-Spinal Fluid) leakage: This is a rare problem, but may occur  with prolonged intrathecal or epidural catheters either due to the formation of a fistulous  track or a dural tear.       8. Nerve damage: By working so close to the spinal cord, there is always a possibility of  nerve damage, which could be as serious as a permanent spinal cord injury with  paralysis.       9. Death:  Although rare, severe deadly allergic reactions known as "Anaphylactic  reaction" can occur to any of the medications used.      10. Worsening of the symptoms:  We can always make thing worse.  What are the chances of something like this happening? Chances of any of this occuring are extremely low.  By statistics, you have more of a chance of getting killed in a motor vehicle accident: while driving to the hospital than any of the above occurring .  Nevertheless, you should be aware that they are possibilities.  In general, it is similar to taking a shower.  Everybody knows that you can slip, hit your head and get killed.  Does that mean that you should not shower again?  Nevertheless always keep in mind that statistics do not mean  anything if you happen to be on the wrong side of them.  Even if a procedure has a 1 (one) in a 1,000,000 (million) chance of going wrong, it you happen to be that one..Also, keep in mind that by statistics, you have more of a chance of having something go wrong when taking medications.  Who should not have this procedure? If you are on a blood thinning medication (e.g. Coumadin, Plavix, see list of "Blood Thinners"), or if you have an active infection going on, you should not have the procedure.  If you are taking any blood thinners, please inform your physician.  How should I prepare for this procedure?  Do not eat or drink anything at least six hours prior to the procedure.  Bring a driver with you .  It cannot be a taxi.  Come accompanied by an adult that can drive you back, and that is strong enough to help you if your legs get weak or numb from the local anesthetic.  Take all of your medicines the morning of the procedure with just enough water to swallow them.  If you have diabetes, make sure that you are scheduled to have your procedure done first thing in the morning, whenever possible.  If you have diabetes, take  only half of your insulin dose and notify our nurse that you have done so as soon as you arrive at the clinic.  If you are diabetic, but only take blood sugar pills (oral hypoglycemic), then do not take them on the morning of your procedure.  You may take them after you have had the procedure.  Do not take aspirin or any aspirin-containing medications, at least eleven (11) days prior to the procedure.  They may prolong bleeding.  Wear loose fitting clothing that may be easy to take off and that you would not mind if it got stained with Betadine or blood.  Do not wear any jewelry or perfume  Remove any nail coloring.  It will interfere with some of our monitoring equipment.  NOTE: Remember that this is not meant to be interpreted as a complete list of all possible  complications.  Unforeseen problems may occur.  BLOOD THINNERS The following drugs contain aspirin or other products, which can cause increased bleeding during surgery and should not be taken for 2 weeks prior to and 1 week after surgery.  If you should need take something for relief of minor pain, you may take acetaminophen which is found in Tylenol,m Datril, Anacin-3 and Panadol. It is not blood thinner. The products listed below are.  Do not take any of the products listed below in addition to any listed on your instruction sheet.  A.P.C or A.P.C with Codeine Codeine Phosphate Capsules #3 Ibuprofen Ridaura  ABC compound Congesprin Imuran rimadil  Advil Cope Indocin Robaxisal  Alka-Seltzer Effervescent Pain Reliever and Antacid Coricidin or Coricidin-D  Indomethacin Rufen  Alka-Seltzer plus Cold Medicine Cosprin Ketoprofen S-A-C Tablets  Anacin Analgesic Tablets or Capsules Coumadin Korlgesic Salflex  Anacin Extra Strength Analgesic tablets or capsules CP-2 Tablets Lanoril Salicylate  Anaprox Cuprimine Capsules Levenox Salocol  Anexsia-D Dalteparin Magan Salsalate  Anodynos Darvon compound Magnesium Salicylate Sine-off  Ansaid Dasin Capsules Magsal Sodium Salicylate  Anturane Depen Capsules Marnal Soma  APF Arthritis pain formula Dewitt's Pills Measurin Stanback  Argesic Dia-Gesic Meclofenamic Sulfinpyrazone  Arthritis Bayer Timed Release Aspirin Diclofenac Meclomen Sulindac  Arthritis pain formula Anacin Dicumarol Medipren Supac  Analgesic (Safety coated) Arthralgen Diffunasal Mefanamic Suprofen  Arthritis Strength Bufferin Dihydrocodeine Mepro Compound Suprol  Arthropan liquid Dopirydamole Methcarbomol with Aspirin Synalgos  ASA tablets/Enseals Disalcid Micrainin Tagament  Ascriptin Doan's Midol Talwin  Ascriptin A/D Dolene Mobidin Tanderil  Ascriptin Extra Strength Dolobid Moblgesic Ticlid  Ascriptin with Codeine Doloprin or Doloprin with Codeine Momentum Tolectin  Asperbuf Duoprin  Mono-gesic Trendar  Aspergum Duradyne Motrin or Motrin IB Triminicin  Aspirin plain, buffered or enteric coated Durasal Myochrisine Trigesic  Aspirin Suppositories Easprin Nalfon Trillsate  Aspirin with Codeine Ecotrin Regular or Extra Strength Naprosyn Uracel  Atromid-S Efficin Naproxen Ursinus  Auranofin Capsules Elmiron Neocylate Vanquish  Axotal Emagrin Norgesic Verin  Azathioprine Empirin or Empirin with Codeine Normiflo Vitamin E  Azolid Emprazil Nuprin Voltaren  Bayer Aspirin plain, buffered or children's or timed BC Tablets or powders Encaprin Orgaran Warfarin Sodium  Buff-a-Comp Enoxaparin Orudis Zorpin  Buff-a-Comp with Codeine Equegesic Os-Cal-Gesic   Buffaprin Excedrin plain, buffered or Extra Strength Oxalid   Bufferin Arthritis Strength Feldene Oxphenbutazone   Bufferin plain or Extra Strength Feldene Capsules Oxycodone with Aspirin   Bufferin with Codeine Fenoprofen Fenoprofen Pabalate or Pabalate-SF   Buffets II Flogesic Panagesic   Buffinol plain or Extra Strength Florinal or Florinal with Codeine Panwarfarin   Buf-Tabs Flurbiprofen Penicillamine   Butalbital Compound Four-way cold tablets Penicillin  Butazolidin Fragmin Pepto-Bismol   Carbenicillin Geminisyn Percodan   Carna Arthritis Reliever Geopen Persantine   Carprofen Gold's salt Persistin   Chloramphenicol Goody's Phenylbutazone   Chloromycetin Haltrain Piroxlcam   Clmetidine heparin Plaquenil   Cllnoril Hyco-pap Ponstel   Clofibrate Hydroxy chloroquine Propoxyphen         Before stopping any of these medications, be sure to consult the physician who ordered them.  Some, such as Coumadin (Warfarin) are ordered to prevent or treat serious conditions such as "deep thrombosis", "pumonary embolisms", and other heart problems.  The amount of time that you may need off of the medication may also vary with the medication and the reason for which you were taking it.  If you are taking any of these medications, please  make sure you notify your pain physician before you undergo any procedures.          Facet Joint Block The facet joints connect the bones of the spine (vertebrae). They make it possible for you to bend, twist, and make other movements with your spine. They also keep you from bending too far, twisting too far, and making other excessive movements. A facet joint block is a procedure where a numbing medicine (anesthetic) is injected into a facet joint. Often, a type of anti-inflammatory medicine called a steroid is also injected. A facet joint block may be done to diagnose neck or back pain. If the pain gets better after a facet joint block, it means the pain is probably coming from the facet joint. If the pain does not get better, it means the pain is probably not coming from the facet joint. A facet joint block may also be done to relieve neck or back pain caused by an inflamed facet joint. A facet joint block is only done to relieve pain if the pain does not improve with other methods, such as medicine, exercise programs, and physical therapy. Tell a health care provider about:  Any allergies you have.  All medicines you are taking, including vitamins, herbs, eye drops, creams, and over-the-counter medicines.  Any problems you or family members have had with anesthetic medicines.  Any blood disorders you have.  Any surgeries you have had.  Any medical conditions you have.  Whether you are pregnant or may be pregnant. What are the risks? Generally, this is a safe procedure. However, problems may occur, including:  Bleeding.  Injury to a nerve near the injection site.  Pain at the injection site.  Weakness or numbness in areas controlled by nerves near the injection site.  Infection.  Temporary fluid retention.  Allergic reactions to medicines or dyes.  Injury to other structures or organs near the injection site.  What happens before the procedure?  Follow instructions  from your health care provider about eating or drinking restrictions.  Ask your health care provider about: ? Changing or stopping your regular medicines. This is especially important if you are taking diabetes medicines or blood thinners. ? Taking medicines such as aspirin and ibuprofen. These medicines can thin your blood. Do not take these medicines before your procedure if your health care provider instructs you not to.  Do not take any new dietary supplements or medicines without asking your health care provider first.  Plan to have someone take you home after the procedure. What happens during the procedure?  You may need to remove your clothing and dress in an open-back gown.  The procedure will be done while you are lying on an  X-ray table. You will most likely be asked to lie on your stomach, but you may be asked to lie in a different position if an injection will be made in your neck.  Machines will be used to monitor your oxygen levels, heart rate, and blood pressure.  If an injection will be made in your neck, an IV tube will be inserted into one of your veins. Fluids and medicine will flow directly into your body through the IV tube.  The area over the facet joint where the injection will be made will be cleaned with soap. The surrounding skin will be covered with clean drapes.  A numbing medicine (local anesthetic) will be applied to your skin. Your skin may sting or burn for a moment.  A video X-ray machine (fluoroscopy) will be used to locate the joint. In some cases, a CT scan may be used.  A contrast dye may be injected into the facet joint area to help locate the joint.  When the joint is located, an anesthetic will be injected into the joint through the needle.  Your health care provider will ask you whether you feel pain relief. If you do feel relief, a steroid may be injected to provide pain relief for a longer period of time. If you do not feel relief or feel only  partial relief, additional injections of an anesthetic may be made in other facet joints.  The needle will be removed.  Your skin will be cleaned.  A bandage (dressing) will be applied over each injection site. The procedure may vary among health care providers and hospitals. What happens after the procedure?  You will be observed for 15-30 minutes before being allowed to go home. This information is not intended to replace advice given to you by your health care provider. Make sure you discuss any questions you have with your health care provider. Document Released: 05/16/2006 Document Revised: 01/26/2015 Document Reviewed: 09/20/2014 [employer]vier Interactive Patient Education  [employer].

## 2017-02-11 NOTE — Patient Instructions (Addendum)
____________________________________________________________________________________________  Medication Rules  Applies to: All patients receiving prescriptions (written or electronic).  Pharmacy of record: Pharmacy where electronic prescriptions will be sent. If written prescriptions are taken to a different pharmacy, please inform the nursing staff. The pharmacy listed in the electronic medical record should be the one where you would like electronic prescriptions to be sent.  Prescription refills: Only during scheduled appointments. Applies to both, written and electronic prescriptions.  NOTE: The following applies primarily to controlled substances (Opioid* Pain Medications).   Patient's responsibilities: 1. Pain Pills: Bring all pain pills to every appointment (except for procedure appointments). 2. Pill Bottles: Bring pills in original pharmacy bottle. Always bring newest bottle. Bring bottle, even if empty. 3. Medication refills: You are responsible for knowing and keeping track of what medications you need refilled. The day before your appointment, write a list of all prescriptions that need to be refilled. Bring that list to your appointment and give it to the admitting nurse. Prescriptions will be written only during appointments. If you forget a medication, it will not be "Called in", "Faxed", or "electronically sent". You will need to get another appointment to get these prescribed. 4. Prescription Accuracy: You are responsible for carefully inspecting your prescriptions before leaving our office. Have the discharge nurse carefully go over each prescription with you, before taking them home. Make sure that your name is accurately spelled, that your address is correct. Check the name and dose of your medication to make sure it is accurate. Check the number of pills, and the written instructions to make sure they are clear and accurate. Make sure that you are given enough medication to  last until your next medication refill appointment. 5. Taking Medication: Take medication as prescribed. Never take more pills than instructed. Never take medication more frequently than prescribed. Taking less pills or less frequently is permitted and encouraged, when it comes to controlled substances (written prescriptions).  6. Inform other Doctors: Always inform, all of your healthcare providers, of all the medications you take. 7. Pain Medication from other Providers: You are not allowed to accept any additional pain medication from any other Doctor or Healthcare provider. There are two exceptions to this rule. (see below) In the event that you require additional pain medication, you are responsible for notifying us, as stated below. 8. Medication Agreement: You are responsible for carefully reading and following our Medication Agreement. This must be signed before receiving any prescriptions from our practice. Safely store a copy of your signed Agreement. Violations to the Agreement will result in no further prescriptions. (Additional copies of our Medication Agreement are available upon request.) 9. Laws, Rules, & Regulations: All patients are expected to follow all Federal and State Laws, Statutes, Rules, & Regulations. Ignorance of the Laws does not constitute a valid excuse. The use of any illegal substances is prohibited. 10. Adopted CDC guidelines & recommendations: Target dosing levels will be at or below 60 MME/day. Use of benzodiazepines** is not recommended.  Exceptions: There are only two exceptions to the rule of not receiving pain medications from other Healthcare Providers. 1. Exception #1 (Emergencies): In the event of an emergency (i.e.: accident requiring emergency care), you are allowed to receive additional pain medication. However, you are responsible for: As soon as you are able, call our office [phone], at any time of the day or night, and leave a message stating your  name, the date and nature of the emergency, and the name and dose of the medication   prescribed. In the event that your call is answered by a member of our staff, make sure to document and save the date, time, and the name of the person that took your information.  2. Exception #2 (Planned Surgery): In the event that you are scheduled by another doctor or dentist to have any type of surgery or procedure, you are allowed (for a period no longer than 30 days), to receive additional pain medication, for the acute post-op pain. However, in this case, you are responsible for picking up a copy of our "Post-op Pain Management for Surgeons" handout, and giving it to your surgeon or dentist. This document is available at our office, and does not require an appointment to obtain it. Simply go to our office during business hours (Monday-Thursday from 8:00 AM to 4:00 PM) (Friday 8:00 AM to 12:00 Noon) or if you have a scheduled appointment with [place], prior to your surgery, and ask for it by name. In addition, you will need to provide [place] with your name, name of your surgeon, type of surgery, and date of procedure or surgery.  *Opioid medications include: morphine, codeine, oxycodone, oxymorphone, hydrocodone, hydromorphone, meperidine, tramadol, tapentadol, buprenorphine, fentanyl, methadone. **Benzodiazepine medications include: diazepam (Valium), alprazolam (Xanax), clonazepam (Klonopine), lorazepam (Ativan), clorazepate (Tranxene), chlordiazepoxide (Librium), estazolam (Prosom), oxazepam (Serax), temazepam (Restoril), triazolam (Halcion)  ____________________________________________________________________________________________   BMI Assessment: Estimated body mass index is 31.83 kg/m as calculated from the following:   Height as of this encounter: 5\' 2"  (1.575 m).   Weight as of this encounter: 174 lb (78.9 kg).  BMI interpretation table: BMI level Category Range association with higher incidence of chronic  pain  <18 kg/m2 Underweight   18.5-24.9 kg/m2 Ideal body weight   25-29.9 kg/m2 Overweight Increased incidence by 20%  30-34.9 kg/m2 Obese (Class I) Increased incidence by 68%  35-39.9 kg/m2 Severe obesity (Class II) Increased incidence by 136%  >40 kg/m2 Extreme obesity (Class III) Increased incidence by 254%   BMI Readings from Last 4 Encounters:  02/11/17 31.83 kg/m  01/24/17 31.83 kg/m  01/17/17 32.19 kg/m  01/14/17 32.19 kg/m   Wt Readings from Last 4 Encounters:  02/11/17 174 lb (78.9 kg)  01/24/17 174 lb (78.9 kg)  01/17/17 176 lb (79.8 kg)  01/14/17 176 lb (79.8 kg)  GENERAL RISKS AND COMPLICATIONS  What are the risk, side effects and possible complications? Generally speaking, most procedures are safe.  However, with any procedure there are risks, side effects, and the possibility of complications.  The risks and complications are dependent upon the sites that are lesioned, or the type of nerve block to be performed.  The closer the procedure is to the spine, the more serious the risks are.  Great care is taken when placing the radio frequency needles, block needles or lesioning probes, but sometimes complications can occur. 1. Infection: Any time there is an injection through the skin, there is a risk of infection.  This is why sterile conditions are used for these blocks.  There are four possible types of infection. 1. Localized skin infection. 2. Central Nervous System Infection-This can be in the form of Meningitis, which can be deadly. 3. Epidural Infections-This can be in the form of an epidural abscess, which can cause pressure inside of the spine, causing compression of the spinal cord with subsequent paralysis. This would require an emergency surgery to decompress, and there are no guarantees that the patient would recover from the paralysis. 4. Discitis-This is an infection of the intervertebral  discs.  It occurs in about 1% of discography procedures.  It is  difficult to treat and it may lead to surgery.        2. Pain: the needles have to go through skin and soft tissues, will cause soreness.       3. Damage to internal structures:  The nerves to be lesioned may be near blood vessels or    other nerves which can be potentially damaged.       4. Bleeding: Bleeding is more common if the patient is taking blood thinners such as  aspirin, Coumadin, Ticiid, Plavix, etc., or if he/she have some genetic predisposition  such as hemophilia. Bleeding into the spinal canal can cause compression of the spinal  cord with subsequent paralysis.  This would require an emergency surgery to  decompress and there are no guarantees that the patient would recover from the  paralysis.       5. Pneumothorax:  Puncturing of a lung is a possibility, every time a needle is introduced in  the area of the chest or upper back.  Pneumothorax refers to free air around the  collapsed lung(s), inside of the thoracic cavity (chest cavity).  Another two possible  complications related to a similar event would include: Hemothorax and Chylothorax.   These are variations of the Pneumothorax, where instead of air around the collapsed  lung(s), you may have blood or chyle, respectively.       6. Spinal headaches: They may occur with any procedures in the area of the spine.       7. Persistent CSF (Cerebro-Spinal Fluid) leakage: This is a rare problem, but may occur  with prolonged intrathecal or epidural catheters either due to the formation of a fistulous  track or a dural tear.       8. Nerve damage: By working so close to the spinal cord, there is always a possibility of  nerve damage, which could be as serious as a permanent spinal cord injury with  paralysis.       9. Death:  Although rare, severe deadly allergic reactions known as "Anaphylactic  reaction" can occur to any of the medications used.      10. Worsening of the symptoms:  We can always make thing worse.  What are the chances of  something like this happening? Chances of any of this occuring are extremely low.  By statistics, you have more of a chance of getting killed in a motor vehicle accident: while driving to the hospital than any of the above occurring .  Nevertheless, you should be aware that they are possibilities.  In general, it is similar to taking a shower.  Everybody knows that you can slip, hit your head and get killed.  Does that mean that you should not shower again?  Nevertheless always keep in mind that statistics do not mean anything if you happen to be on the wrong side of them.  Even if a procedure has a 1 (one) in a 1,000,000 (million) chance of going wrong, it you happen to be that one..Also, keep in mind that by statistics, you have more of a chance of having something go wrong when taking medications.  Who should not have this procedure? If you are on a blood thinning medication (e.g. Coumadin, Plavix, see list of "Blood Thinners"), or if you have an active infection going on, you should not have the procedure.  If you are taking any blood thinners, please inform  your physician.  How should I prepare for this procedure?  Do not eat or drink anything at least six hours prior to the procedure.  Bring a driver with you .  It cannot be a taxi.  Come accompanied by an adult that can drive you back, and that is strong enough to help you if your legs get weak or numb from the local anesthetic.  Take all of your medicines the morning of the procedure with just enough water to swallow them.  If you have diabetes, make sure that you are scheduled to have your procedure done first thing in the morning, whenever possible.  If you have diabetes, take only half of your insulin dose and notify our nurse that you have done so as soon as you arrive at the clinic.  If you are diabetic, but only take blood sugar pills (oral hypoglycemic), then do not take them on the morning of your procedure.  You may take them  after you have had the procedure.  Do not take aspirin or any aspirin-containing medications, at least eleven (11) days prior to the procedure.  They may prolong bleeding.  Wear loose fitting clothing that may be easy to take off and that you would not mind if it got stained with Betadine or blood.  Do not wear any jewelry or perfume  Remove any nail coloring.  It will interfere with some of our monitoring equipment.  NOTE: Remember that this is not meant to be interpreted as a complete list of all possible complications.  Unforeseen problems may occur.  BLOOD THINNERS The following drugs contain aspirin or other products, which can cause increased bleeding during surgery and should not be taken for 2 weeks prior to and 1 week after surgery.  If you should need take something for relief of minor pain, you may take acetaminophen which is found in Tylenol,m Datril, Anacin-3 and Panadol. It is not blood thinner. The products listed below are.  Do not take any of the products listed below in addition to any listed on your instruction sheet.  A.P.C or A.P.C with Codeine Codeine Phosphate Capsules #3 Ibuprofen Ridaura  ABC compound Congesprin Imuran rimadil  Advil Cope Indocin Robaxisal  Alka-Seltzer Effervescent Pain Reliever and Antacid Coricidin or Coricidin-D  Indomethacin Rufen  Alka-Seltzer plus Cold Medicine Cosprin Ketoprofen S-A-C Tablets  Anacin Analgesic Tablets or Capsules Coumadin Korlgesic Salflex  Anacin Extra Strength Analgesic tablets or capsules CP-2 Tablets Lanoril Salicylate  Anaprox Cuprimine Capsules Levenox Salocol  Anexsia-D Dalteparin Magan Salsalate  Anodynos Darvon compound Magnesium Salicylate Sine-off  Ansaid Dasin Capsules Magsal Sodium Salicylate  Anturane Depen Capsules Marnal Soma  APF Arthritis pain formula Dewitt's Pills Measurin Stanback  Argesic Dia-Gesic Meclofenamic Sulfinpyrazone  Arthritis Bayer Timed Release Aspirin Diclofenac Meclomen Sulindac   Arthritis pain formula Anacin Dicumarol Medipren Supac  Analgesic (Safety coated) Arthralgen Diffunasal Mefanamic Suprofen  Arthritis Strength Bufferin Dihydrocodeine Mepro Compound Suprol  Arthropan liquid Dopirydamole Methcarbomol with Aspirin Synalgos  ASA tablets/Enseals Disalcid Micrainin Tagament  Ascriptin Doan's Midol Talwin  Ascriptin A/D Dolene Mobidin Tanderil  Ascriptin Extra Strength Dolobid Moblgesic Ticlid  Ascriptin with Codeine Doloprin or Doloprin with Codeine Momentum Tolectin  Asperbuf Duoprin Mono-gesic Trendar  Aspergum Duradyne Motrin or Motrin IB Triminicin  Aspirin plain, buffered or enteric coated Durasal Myochrisine Trigesic  Aspirin Suppositories Easprin Nalfon Trillsate  Aspirin with Codeine Ecotrin Regular or Extra Strength Naprosyn Uracel  Atromid-S Efficin Naproxen Ursinus  Auranofin Capsules Elmiron Neocylate Vanquish  Axotal Emagrin Norgesic  Verin  Azathioprine Empirin or Empirin with Codeine Normiflo Vitamin E  Azolid Emprazil Nuprin Voltaren  Bayer Aspirin plain, buffered or children's or timed BC Tablets or powders Encaprin Orgaran Warfarin Sodium  Buff-a-Comp Enoxaparin Orudis Zorpin  Buff-a-Comp with Codeine Equegesic Os-Cal-Gesic   Buffaprin Excedrin plain, buffered or Extra Strength Oxalid   Bufferin Arthritis Strength Feldene Oxphenbutazone   Bufferin plain or Extra Strength Feldene Capsules Oxycodone with Aspirin   Bufferin with Codeine Fenoprofen Fenoprofen Pabalate or Pabalate-SF   Buffets II Flogesic Panagesic   Buffinol plain or Extra Strength Florinal or Florinal with Codeine Panwarfarin   Buf-Tabs Flurbiprofen Penicillamine   Butalbital Compound Four-way cold tablets Penicillin   Butazolidin Fragmin Pepto-Bismol   Carbenicillin Geminisyn Percodan   Carna Arthritis Reliever Geopen Persantine   Carprofen Gold's salt Persistin   Chloramphenicol Goody's Phenylbutazone   Chloromycetin Haltrain Piroxlcam   Clmetidine heparin Plaquenil    Cllnoril Hyco-pap Ponstel   Clofibrate Hydroxy chloroquine Propoxyphen         Before stopping any of these medications, be sure to consult the physician who ordered them.  Some, such as Coumadin (Warfarin) are ordered to prevent or treat serious conditions such as "deep thrombosis", "pumonary embolisms", and other heart problems.  The amount of time that you may need off of the medication may also vary with the medication and the reason for which you were taking it.  If you are taking any of these medications, please make sure you notify your pain physician before you undergo any procedures.          Facet Joint Block The facet joints connect the bones of the spine (vertebrae). They make it possible for you to bend, twist, and make other movements with your spine. They also keep you from bending too far, twisting too far, and making other excessive movements. A facet joint block is a procedure where a numbing medicine (anesthetic) is injected into a facet joint. Often, a type of anti-inflammatory medicine called a steroid is also injected. A facet joint block may be done to diagnose neck or back pain. If the pain gets better after a facet joint block, it means the pain is probably coming from the facet joint. If the pain does not get better, it means the pain is probably not coming from the facet joint. A facet joint block may also be done to relieve neck or back pain caused by an inflamed facet joint. A facet joint block is only done to relieve pain if the pain does not improve with other methods, such as medicine, exercise programs, and physical therapy. Tell a health care provider about:  Any allergies you have.  All medicines you are taking, including vitamins, herbs, eye drops, creams, and over-the-counter medicines.  Any problems you or family members have had with anesthetic medicines.  Any blood disorders you have.  Any surgeries you have had.  Any medical conditions you  have.  Whether you are pregnant or may be pregnant. What are the risks? Generally, this is a safe procedure. However, problems may occur, including:  Bleeding.  Injury to a nerve near the injection site.  Pain at the injection site.  Weakness or numbness in areas controlled by nerves near the injection site.  Infection.  Temporary fluid retention.  Allergic reactions to medicines or dyes.  Injury to other structures or organs near the injection site.  What happens before the procedure?  Follow instructions from your health care provider about eating or drinking restrictions.  Ask your health care provider about: ? Changing or stopping your regular medicines. This is especially important if you are taking diabetes medicines or blood thinners. ? Taking medicines such as aspirin and ibuprofen. These medicines can thin your blood. Do not take these medicines before your procedure if your health care provider instructs you not to.  Do not take any new dietary supplements or medicines without asking your health care provider first.  Plan to have someone take you home after the procedure. What happens during the procedure?  You may need to remove your clothing and dress in an open-back gown.  The procedure will be done while you are lying on an X-ray table. You will most likely be asked to lie on your stomach, but you may be asked to lie in a different position if an injection will be made in your neck.  Machines will be used to monitor your oxygen levels, heart rate, and blood pressure.  If an injection will be made in your neck, an IV tube will be inserted into one of your veins. Fluids and medicine will flow directly into your body through the IV tube.  The area over the facet joint where the injection will be made will be cleaned with soap. The surrounding skin will be covered with clean drapes.  A numbing medicine (local anesthetic) will be applied to your skin. Your skin  may sting or burn for a moment.  A video X-ray machine (fluoroscopy) will be used to locate the joint. In some cases, a CT scan may be used.  A contrast dye may be injected into the facet joint area to help locate the joint.  When the joint is located, an anesthetic will be injected into the joint through the needle.  Your health care provider will ask you whether you feel pain relief. If you do feel relief, a steroid may be injected to provide pain relief for a longer period of time. If you do not feel relief or feel only partial relief, additional injections of an anesthetic may be made in other facet joints.  The needle will be removed.  Your skin will be cleaned.  A bandage (dressing) will be applied over each injection site. The procedure may vary among health care providers and hospitals. What happens after the procedure?  You will be observed for 15-30 minutes before being allowed to go home. This information is not intended to replace advice given to you by your health care provider. Make sure you discuss any questions you have with your health care provider. Document Released: 05/16/2006 Document Revised: 01/26/2015 Document Reviewed: 09/20/2014 [employer] Interactive Patient Education  [employer].

## 2017-02-14 DIAGNOSIS — S92354D Nondisplaced fracture of fifth metatarsal bone, right foot, subsequent encounter for fracture with routine healing: Secondary | ICD-10-CM | POA: Diagnosis not present

## 2017-02-15 LAB — TOXASSURE SELECT 13 (MW), URINE

## 2017-02-21 ENCOUNTER — Other Ambulatory Visit: Payer: Self-pay | Admitting: Nurse Practitioner

## 2017-02-21 DIAGNOSIS — Z95828 Presence of other vascular implants and grafts: Secondary | ICD-10-CM

## 2017-02-23 ENCOUNTER — Ambulatory Visit
Admission: RE | Admit: 2017-02-23 | Discharge: 2017-02-23 | Disposition: A | Discharge status: Home / Self Care | Payer: Medicare HMO | Source: Ambulatory Visit | Attending: Nurse Practitioner | Admitting: Nurse Practitioner

## 2017-02-23 DIAGNOSIS — Z95828 Presence of other vascular implants and grafts: Secondary | ICD-10-CM | POA: Diagnosis not present

## 2017-02-28 ENCOUNTER — Ambulatory Visit: Payer: Medicare HMO

## 2017-03-08 ENCOUNTER — Ambulatory Visit
Admission: RE | Admit: 2017-03-08 | Discharge: 2017-03-08 | Disposition: A | Discharge status: Home / Self Care | Payer: Medicare HMO | Source: Ambulatory Visit | Attending: Nurse Practitioner | Admitting: Nurse Practitioner

## 2017-03-08 DIAGNOSIS — M47816 Spondylosis without myelopathy or radiculopathy, lumbar region: Secondary | ICD-10-CM | POA: Diagnosis not present

## 2017-03-08 DIAGNOSIS — M5441 Lumbago with sciatica, right side: Secondary | ICD-10-CM | POA: Insufficient documentation

## 2017-03-08 DIAGNOSIS — M5442 Lumbago with sciatica, left side: Secondary | ICD-10-CM | POA: Insufficient documentation

## 2017-03-08 DIAGNOSIS — G8929 Other chronic pain: Secondary | ICD-10-CM

## 2017-03-08 DIAGNOSIS — M48061 Spinal stenosis, lumbar region without neurogenic claudication: Secondary | ICD-10-CM | POA: Diagnosis not present

## 2017-03-08 DIAGNOSIS — M545 Low back pain: Secondary | ICD-10-CM | POA: Diagnosis not present

## 2017-03-11 ENCOUNTER — Ambulatory Visit: Payer: Medicare HMO | Attending: Nurse Practitioner | Admitting: Nurse Practitioner

## 2017-03-11 ENCOUNTER — Other Ambulatory Visit: Payer: Self-pay

## 2017-03-11 ENCOUNTER — Encounter: Payer: Self-pay | Admitting: Nurse Practitioner

## 2017-03-11 VITALS — BP 132/73 | HR 68 | Temp 98.0°F | Resp 16 | Ht 62.0 in | Wt 172.0 lb

## 2017-03-11 DIAGNOSIS — M160 Bilateral primary osteoarthritis of hip: Secondary | ICD-10-CM | POA: Insufficient documentation

## 2017-03-11 DIAGNOSIS — G894 Chronic pain syndrome: Secondary | ICD-10-CM

## 2017-03-11 DIAGNOSIS — Z79899 Other long term (current) drug therapy: Secondary | ICD-10-CM | POA: Diagnosis not present

## 2017-03-11 DIAGNOSIS — F329 Major depressive disorder, single episode, unspecified: Secondary | ICD-10-CM | POA: Insufficient documentation

## 2017-03-11 DIAGNOSIS — M5441 Lumbago with sciatica, right side: Secondary | ICD-10-CM

## 2017-03-11 DIAGNOSIS — M48061 Spinal stenosis, lumbar region without neurogenic claudication: Secondary | ICD-10-CM | POA: Insufficient documentation

## 2017-03-11 DIAGNOSIS — M5442 Lumbago with sciatica, left side: Secondary | ICD-10-CM

## 2017-03-11 DIAGNOSIS — M25512 Pain in left shoulder: Secondary | ICD-10-CM | POA: Diagnosis not present

## 2017-03-11 DIAGNOSIS — Z87891 Personal history of nicotine dependence: Secondary | ICD-10-CM | POA: Diagnosis not present

## 2017-03-11 DIAGNOSIS — M25562 Pain in left knee: Secondary | ICD-10-CM | POA: Insufficient documentation

## 2017-03-11 DIAGNOSIS — E785 Hyperlipidemia, unspecified: Secondary | ICD-10-CM | POA: Diagnosis not present

## 2017-03-11 DIAGNOSIS — Z5181 Encounter for therapeutic drug level monitoring: Secondary | ICD-10-CM | POA: Diagnosis not present

## 2017-03-11 DIAGNOSIS — M47816 Spondylosis without myelopathy or radiculopathy, lumbar region: Secondary | ICD-10-CM | POA: Diagnosis not present

## 2017-03-11 DIAGNOSIS — Z981 Arthrodesis status: Secondary | ICD-10-CM | POA: Diagnosis not present

## 2017-03-11 DIAGNOSIS — Z9884 Bariatric surgery status: Secondary | ICD-10-CM | POA: Diagnosis not present

## 2017-03-11 DIAGNOSIS — Z79891 Long term (current) use of opiate analgesic: Secondary | ICD-10-CM | POA: Diagnosis not present

## 2017-03-11 DIAGNOSIS — M533 Sacrococcygeal disorders, not elsewhere classified: Secondary | ICD-10-CM | POA: Insufficient documentation

## 2017-03-11 DIAGNOSIS — R69 Illness, unspecified: Secondary | ICD-10-CM | POA: Diagnosis not present

## 2017-03-11 DIAGNOSIS — Z881 Allergy status to other antibiotic agents status: Secondary | ICD-10-CM | POA: Insufficient documentation

## 2017-03-11 DIAGNOSIS — G8929 Other chronic pain: Secondary | ICD-10-CM

## 2017-03-11 MED ORDER — TRAMADOL HCL 50 MG PO TABS: 50.0000 mg | ORAL_TABLET | Freq: Four times a day (QID) | ORAL | 1 refills | Status: DC | PRN

## 2017-03-11 MED ORDER — TIZANIDINE HCL 4 MG PO TABS
4.0000 mg | ORAL_TABLET | Freq: Three times a day (TID) | ORAL | 1 refills | Status: DC
Start: 2017-03-16 — End: 2017-05-09

## 2017-03-11 NOTE — Progress Notes (Signed)
Patient's Name: Roselene Gray  MRN: [id number]  Referring Provider: Cletis Athens, MD  DOB: 15-Apr-1955  PCP: Cletis Athens, MD  DOS: 03/11/2017  Note by: Vevelyn Francois NP  Service setting: Ambulatory outpatient  Specialty: Interventional Pain Management  Location: [facility] (AMB) Pain Management Facility    Patient type: Established    Primary Reason(s) for Visit: Encounter for prescription drug management. (Level of risk: moderate)  CC: Back Pain (lower, primarily left)  HPI  [name] is a 62 y.o. year old, female patient, who comes today for a medication management evaluation. She has Chronic pain syndrome; Chronic neck pain ; Chronic hip pain (Secondary Area of Pain) (Bilateral) (L>R); Long term current use of opiate analgesic; Long term prescription opiate use; Opiate use; Chronic knee pain (Left); Osteoarthritis of hip (Bilateral); Chronic Low back pain (Primary Area of Pain) (Bilateral)  (R>L); Chronic pain of lower extremity (Tertiary Area of Pain) (Bilateral) (L>R); Long term prescription benzodiazepine use; Grade 1 Anterolisthesis of L3 over L4; Failed back surgical syndrome; Chronic shoulder pain (Left); History of cervical fusion (ACDF C4-C7); DDD (degenerative disc disease), cervical; Chronic sacroiliac joint pain (Bilateral) (R>L); Lumbar Facet Syndrome (Bilateral) (R>L); Lumbar facet osteoarthritis (Bilateral); and DDD (degenerative disc disease), lumbar on their problem list. Her primarily concern today is the Back Pain (lower, primarily left)  Pain Assessment: Location: Lower, Left Back Radiating: sometimes radiates through left hip to baack of left thigh Onset: More than a month ago Quality: Throbbing, Stabbing, Sharp, Aching Severity: 3 /10 (self-reported pain score)  Note: Reported level is compatible with observation.                          Effect on ADL: sometimes it is difficult to rise to a standing position without assist Timing: Constant Modifying factors: medications only  help "a little bit"  [name] was last scheduled for an appointment on 02/11/2017 for medication management. During today's appointment we reviewed [name]'s chronic pain status, as well as her outpatient medication regimen. She continues to have low back pain. She admits that it is worse on the left however both sides hurt. She is SP 2 lumbar facet nerve blocks. She did not get long term relief with her second nerve block. She admits that insurance denied her next procedure. She had her lumbar MRI repeated. She states that she did trial physical therapy in the past but this was in [place]. She admits that this was not effective and made her pain worse. She does want to retry PT at this time.   The patient  reports that she does not use drugs. Her body mass index is 31.46 kg/m.  Further details on both, my assessment(s), as well as the proposed treatment plan, please see below.  Controlled Substance Pharmacotherapy Assessment REMS (Risk Evaluation and Mitigation Strategy)  Analgesic:Tramadol 50 mg, 1 tab PO q6hrs (200 mg/day) MME/day:48m/day   WRise Patience 03/11/2017  8:54 AM  Signed Nursing Pain Medication Assessment:  Safety precautions to be maintained throughout the outpatient stay will include: orient to surroundings, keep bed in low position, maintain call bell within reach at all times, provide assistance with transfer out of bed and ambulation.  Medication Inspection Compliance: Pill count conducted under aseptic conditions, in front of the patient. Neither the pills nor the bottle was removed from the patient's sight at any time. Once count was completed pills were immediately returned to the patient in their original bottle.  Medication:  Tramadol (Ultram) Pill/Patch Count: 7 of 120 pills remain Pill/Patch Appearance: Markings consistent with prescribed medication Bottle Appearance: Standard pharmacy container. Clearly labeled. Filled Date: 2 / 7 / 2019 Last Medication  intake:  Today   Pharmacokinetics: Liberation and absorption (onset of action): WNL Distribution (time to peak effect): WNL Metabolism and excretion (duration of action): WNL         Pharmacodynamics: Desired effects: Analgesia: [name] reports >50% benefit. Functional ability: Patient reports that medication allows her to accomplish basic ADLs Clinically meaningful improvement in function (CMIF): Sustained CMIF goals met Perceived effectiveness: Described as relatively effective, allowing for increase in activities of daily living (ADL) Undesirable effects: Side-effects or Adverse reactions: None reported Monitoring: [place] PMP: Online review of the past 45-monthperiod conducted. Compliant with practice rules and regulations Last UDS on record: Summary  Date Value Ref Range Status  02/11/2017 FINAL  Final    Comment:    ==================================================================== TOXASSURE SELECT 13 (MW) ==================================================================== Test                             Result       Flag       Units Drug Present and Declared for Prescription Verification   Lorazepam                      811          EXPECTED   ng/mg creat    Source of lorazepam is a scheduled prescription medication.   Tramadol                       >2674        EXPECTED   ng/mg creat   O-Desmethyltramadol            >2674        EXPECTED   ng/mg creat   N-Desmethyltramadol            >2674        EXPECTED   ng/mg creat    Source of tramadol is a prescription medication.    O-desmethyltramadol and N-desmethyltramadol are expected    metabolites of tramadol. Drug Absent but Declared for Prescription Verification   Butalbital                     Not Detected UNEXPECTED ==================================================================== Test                      Result    Flag   Units      Ref Range   Creatinine              187              mg/dL       >=20 ==================================================================== Declared Medications:  The flagging and interpretation on this report are based on the  following declared medications.  Unexpected results may arise from  inaccuracies in the declared medications.  **Note: The testing scope of this panel includes these medications:  Butalbital (Butalbital/APAP/Caffeine)  Lorazepam  Tramadol  **Note: The testing scope of this panel does not include following  reported medications:  Acetaminophen (Butalbital/APAP/Caffeine)  Caffeine (Butalbital/APAP/Caffeine)  Estradiol  Fluticasone  Gabapentin  Iron (Ferrous Sulfate)  Melatonin  Multivitamin  Omeprazole  Sertraline  Simvastatin  Tizanidine '  Trazodone  Turmeric  Vitamin B (Biotin)  Vitamin C ==================================================================== For clinical  consultation, please call [phone]. ====================================================================    UDS interpretation: Compliant          Medication Assessment Form: Reviewed. Patient indicates being compliant with therapy Treatment compliance: Compliant Risk Assessment Profile: Aberrant behavior: See prior evaluations. None observed or detected today Comorbid factors increasing risk of overdose: See prior notes. No additional risks detected today Risk of substance use disorder (SUD): Low Opioid Risk Tool - 03/11/17 0903      Family History of Substance Abuse   Alcohol  Negative    Illegal Drugs  Negative    Rx Drugs  Negative      Personal History of Substance Abuse   Alcohol  Negative    Illegal Drugs  Negative    Rx Drugs  Negative      Age   Age between 72-45 years   No      History of Preadolescent Sexual Abuse   History of Preadolescent Sexual Abuse  Negative or Female      Psychological Disease   Psychological Disease  Negative    Depression  Positive      Total Score   Opioid Risk Tool Scoring  1    Opioid  Risk Interpretation  Low Risk      ORT Scoring interpretation table:  Score <3 = Low Risk for SUD  Score between 4-7 = Moderate Risk for SUD  Score >8 = High Risk for Opioid Abuse   Risk Mitigation Strategies:  Patient Counseling: Covered Patient-Prescriber Agreement (PPA): Present and active  Notification to other healthcare providers: Done  Pharmacologic Plan: No change in therapy, at this time.             Laboratory Chemistry  Inflammation Markers (CRP: Acute Phase) (ESR: Chronic Phase) Lab Results  Component Value Date   CRP 0.7 08/14/2016   ESRSEDRATE 9 08/14/2016                         Rheumatology Markers No results found for: Benay Spice, [id number], [facility]              Renal Function Markers Lab Results  Component Value Date   BUN 17 08/14/2016   CREATININE 0.80 08/14/2016   GFRAA 93 08/14/2016   GFRNONAA 80 08/14/2016                 Hepatic Function Markers Lab Results  Component Value Date   AST 20 08/14/2016   ALT 10 08/14/2016   ALBUMIN 4.9 (H) 08/14/2016   ALKPHOS 103 08/14/2016                 Electrolytes Lab Results  Component Value Date   NA 139 08/14/2016   K 4.1 08/14/2016   CL 97 08/14/2016   CALCIUM 9.6 08/14/2016   MG 2.3 08/14/2016                        Neuropathy Markers Lab Results  Component Value Date   VITAMINB12 473 08/14/2016                 Bone Pathology Markers Lab Results  Component Value Date   25OHVITD1 47 08/14/2016   25OHVITD2 <1.0 08/14/2016   25OHVITD3 47 08/14/2016                         Coagulation Parameters No results found for: INR, LABPROT, APTT, PLT,  DDIMER               Cardiovascular Markers No results found for: BNP, CKTOTAL, CKMB, TROPONINI, HGB, HCT               CA Markers No results found for: CEA, CA125, LABCA2               Note: Lab results reviewed.  Recent Diagnostic Imaging Results  MR LUMBAR SPINE WO CONTRAST CLINICAL DATA:  Chronic progressive low  back pain and left buttock pain. Numbness in the left knee. Previous lumbar fusion at L4-5.  EXAM: MRI LUMBAR SPINE WITHOUT CONTRAST  TECHNIQUE: Multiplanar, multisequence MR imaging of the lumbar spine was performed. No intravenous contrast was administered.  COMPARISON:  Radiographs dated 09/19/2016  FINDINGS: Segmentation:  Standard.  Alignment: 2.4 mm retrolisthesis of L2 on L3. 5 mm anterolisthesis of L3 on L4. 1.6 mm anterolisthesis at L4-5.  Vertebrae:  Posterior fusion and posterior decompression at L4-5.  Conus medullaris and cauda equina: Conus extends to the L1-2 level.  Paraspinal and other soft tissues: Negative.  Disc levels:  L1-2: Slight disc desiccation and disc space narrowing. No disc bulging or protrusion. Minimal degenerative changes of the facet joints.  L2-3: Disc space narrowing with disc desiccation and slight retrolisthesis of L2 on L3 with a tiny broad-based disc bulge asymmetric into the right neural foramen without foraminal stenosis. Slight hypertrophy of the facet joints.  L3-4: 5 mm spondylolisthesis with a small broad-based protrusion of the uncovered disc asymmetric into the left neural foramen. Severe bilateral facet arthritis with hypertrophy of the ligamentum flavum. These findings combine to create severe spinal stenosis and bilateral recess impingement which should affect particularly the L4 nerves but also all more distal nerves. Moderately severe left foraminal stenosis.  L4-5: Minimal broad-based disc bulge. Annular tear of the disc at the level of the right neural foramen without disc protrusion or bulging at that site. Posterior fusion and decompression with no appreciable neural impingement.  L5-S1: Tiny broad-based disc bulge. Tiny annular tears at the level of both neural foramina without neural impingement. Minimal degenerative changes of the left facet joint adjacent to the left S1 nerve root.  IMPRESSION: 1.  Severe spinal stenosis and bilateral lateral recess impingement and moderately severe left foraminal stenosis at L3-4 as described above. 2. Postsurgical changes at L4-5 with no residual neural impingement.  Electronically Signed   By: Lorriane Shire M.D.   On: 03/08/2017 11:53  Complexity Note: Imaging results reviewed. Results shared with [name], using Layman's terms.                         Meds   Current Outpatient Medications:  .  Biotin 10000 MCG TABS, Take 10,000 mcg by mouth 1 day or 1 dose., Disp: , Rfl:  .  butalbital-acetaminophen-caffeine (FIORICET, ESGIC) 50-325-40 MG tablet, TAKE 1 TABLET BY MOUTH EVERY NIGHT AS NEEDED FOR HEADACHE, Disp: , Rfl: 2 .  estradiol (ESTRACE) 1 MG tablet, Take 1 mg by mouth daily., Disp: , Rfl:  .  ferrous sulfate 325 (65 FE) MG tablet, Take 325 mg by mouth daily., Disp: , Rfl:  .  fluticasone (FLONASE) 50 MCG/ACT nasal spray, 1 SPRAY IN EACH NOSTRIL ONCE A DAY NASALLY 30 DAYS, Disp: , Rfl: 3 .  gabapentin (NEURONTIN) 300 MG capsule, TAKE ONE CAPSULE BY MOUTH THREE times a day, Disp: , Rfl: 1 .  LORazepam (ATIVAN) 1 MG tablet, Take  1 mg by mouth 3 (three) times daily., Disp: , Rfl: 0 .  Melatonin 10 MG CAPS, Take 1 capsule by mouth at bedtime., Disp: , Rfl:  .  Multiple Vitamin (MULTIVITAMIN WITH MINERALS) TABS tablet, Take 1 tablet by mouth daily., Disp: , Rfl:  .  omeprazole (PRILOSEC) 40 MG capsule, Take 40 mg by mouth daily., Disp: , Rfl: 0 .  Phenylephrine-DM-GG-APAP (MUCINEX FAST-MAX COLD FLU PO), Take by mouth every 6 (six) hours as needed., Disp: , Rfl:  .  sertraline (ZOLOFT) 100 MG tablet, Take 200 mg by mouth 2 (two) times daily. , Disp: , Rfl: 0 .  simvastatin (ZOCOR) 20 MG tablet, TAKE 1 TABLET IN THE EVENING ONCE A DAY ORALLY 90 DAYS, Disp: , Rfl: 0 .  [START ON 03/16/2017] tiZANidine (ZANAFLEX) 4 MG tablet, Take 1 tablet (4 mg total) by mouth 3 (three) times daily., Disp: 90 tablet, Rfl: 1 .  [START ON 03/16/2017] traMADol (ULTRAM)  50 MG tablet, Take 1 tablet (50 mg total) by mouth every 6 (six) hours as needed for severe pain., Disp: 120 tablet, Rfl: 1 .  traZODone (DESYREL) 150 MG tablet, Take 150 mg by mouth at bedtime., Disp: , Rfl: 0 .  Turmeric 500 MG CAPS, Take 2 capsules by mouth daily., Disp: , Rfl:  .  vitamin C (ASCORBIC ACID) 500 MG tablet, Take 500 mg 2 (two) times daily by mouth., Disp: , Rfl:   ROS  Constitutional: Denies any fever or chills Gastrointestinal: No reported hemesis, hematochezia, vomiting, or acute GI distress Musculoskeletal: Denies any acute onset joint swelling, redness, loss of ROM, or weakness Neurological: No reported episodes of acute onset apraxia, aphasia, dysarthria, agnosia, amnesia, paralysis, loss of coordination, or loss of consciousness  Allergies  [name] is allergic to flagyl [metronidazole].  PFSH  Drug: [name]  reports that she does not use drugs. Alcohol:  reports that she does not drink alcohol. Tobacco:  reports that she has quit smoking. She quit after 4.00 years of use. she has never used smokeless tobacco. Medical:  has a past medical history of Allergy, Disp fx of cuboid bone of right foot, init for clos fx (01/01/2017), Foot fracture, right, Hyperlipidemia, and Thyroid disease. Surgical: [name]  has a past surgical history that includes Cesarean section; Abdominal hysterectomy; Breast reduction surgery; Cholecystectomy; Appendectomy; Gastric bypass (2005); spinal stimulator removed 2012 (2012); acrominoplasty (2015); mini tummy tuck; breast lift bilateral, implants; Lumbar laminectomy (2008); 5 miscarriages; and neck surgery C3-C7. Family: family history includes COPD in her mother; Cancer in her mother; Heart disease in her father.  Constitutional Exam  General appearance: Well nourished, well developed, and well hydrated. In no apparent acute distress Vitals:   03/11/17 0851  BP: 132/73  Pulse: 68  Resp: 16  Temp: 98 F (36.7 C)  TempSrc: Oral   SpO2: 98%  Weight: 172 lb (78 kg)  Height: 5' 2"  (1.575 m)   BMI Assessment: Estimated body mass index is 31.46 kg/m as calculated from the following:   Height as of this encounter: 5' 2"  (1.575 m).   Weight as of this encounter: 172 lb (78 kg). Psych/Mental status: Alert, oriented x 3 (person, place, & time)       Eyes: PERLA Respiratory: No evidence of acute respiratory distress  Cervical Spine Area Exam  Skin & Axial Inspection: No masses, redness, edema, swelling, or associated skin lesions Alignment: Symmetrical Functional ROM: Unrestricted ROM      Stability: No instability detected Muscle Tone/Strength:  Functionally intact. No obvious neuro-muscular anomalies detected. Sensory (Neurological): Unimpaired Palpation: No palpable anomalies              Upper Extremity (UE) Exam    Side: Right upper extremity  Side: Left upper extremity  Skin & Extremity Inspection: Skin color, temperature, and hair growth are WNL. No peripheral edema or cyanosis. No masses, redness, swelling, asymmetry, or associated skin lesions. No contractures.  Skin & Extremity Inspection: Skin color, temperature, and hair growth are WNL. No peripheral edema or cyanosis. No masses, redness, swelling, asymmetry, or associated skin lesions. No contractures.  Functional ROM: Unrestricted ROM          Functional ROM: Unrestricted ROM          Muscle Tone/Strength: Functionally intact. No obvious neuro-muscular anomalies detected.  Muscle Tone/Strength: Functionally intact. No obvious neuro-muscular anomalies detected.  Sensory (Neurological): Unimpaired          Sensory (Neurological): Unimpaired          Palpation: No palpable anomalies              Palpation: No palpable anomalies              Specialized Test(s): Deferred         Specialized Test(s): Deferred          Thoracic Spine Area Exam  Skin & Axial Inspection: No masses, redness, or swelling Alignment: Symmetrical Functional ROM: Unrestricted  ROM Stability: No instability detected Muscle Tone/Strength: Functionally intact. No obvious neuro-muscular anomalies detected. Sensory (Neurological): Unimpaired Muscle strength & Tone: No palpable anomalies  Lumbar Spine Area Exam  Skin & Axial Inspection: Well healed scar from previous spine surgery detected Alignment: Symmetrical Functional ROM: Painful restricted ROM      Stability: No instability detected Muscle Tone/Strength: Functionally intact. No obvious neuro-muscular anomalies detected. Sensory (Neurological): Unimpaired Palpation: Complains of area being tender to palpation       Provocative Tests: Lumbar Hyperextension and rotation test: Positive bilaterally for facet joint pain. Lumbar Lateral bending test: Positive ipsilateral radicular pain, bilaterally. Positive for bilateral foraminal stenosis. Patrick's Maneuver: evaluation deferred today                    Gait & Posture Assessment  Ambulation: Unassisted Gait: Relatively normal for age and body habitus Posture: WNL   Lower Extremity Exam    Side: Right lower extremity  Side: Left lower extremity  Skin & Extremity Inspection: Skin color, temperature, and hair growth are WNL. No peripheral edema or cyanosis. No masses, redness, swelling, asymmetry, or associated skin lesions. No contractures.  Skin & Extremity Inspection: Skin color, temperature, and hair growth are WNL. No peripheral edema or cyanosis. No masses, redness, swelling, asymmetry, or associated skin lesions. No contractures.  Functional ROM: Unrestricted ROM          Functional ROM: Unrestricted ROM          Muscle Tone/Strength: Functionally intact. No obvious neuro-muscular anomalies detected.  Muscle Tone/Strength: Functionally intact. No obvious neuro-muscular anomalies detected.  Sensory (Neurological): Unimpaired  Sensory (Neurological): Unimpaired  Palpation: No palpable anomalies  Palpation: No palpable anomalies   Assessment  Primary  Diagnosis & Pertinent Problem List: The primary encounter diagnosis was Chronic Low back pain (Primary Area of Pain) (Bilateral)  (R>L). Diagnoses of Lumbar spondylosis, Lumbar Facet Syndrome (Bilateral) (R>L), Lumbar facet osteoarthritis (Bilateral), and Chronic pain syndrome were also pertinent to this visit.  Status Diagnosis  Controlled Controlled Controlled 1. Chronic Low  back pain (Primary Area of Pain) (Bilateral)  (R>L)   2. Lumbar spondylosis   3. Lumbar Facet Syndrome (Bilateral) (R>L)   4. Lumbar facet osteoarthritis (Bilateral)   5. Chronic pain syndrome     Problems updated and reviewed during this visit: No problems updated. Plan of Care  Pharmacotherapy (Medications Ordered): Meds ordered this encounter  Medications  . tiZANidine (ZANAFLEX) 4 MG tablet    Sig: Take 1 tablet (4 mg total) by mouth 3 (three) times daily.    Dispense:  90 tablet    Refill:  1    Do not place medication on "Automatic Refill". Fill one day early if pharmacy is closed on scheduled refill date.    Order Specific Question:   Supervising Provider    Answer:   Milinda Pointer [phone]  . traMADol (ULTRAM) 50 MG tablet    Sig: Take 1 tablet (50 mg total) by mouth every 6 (six) hours as needed for severe pain.    Dispense:  120 tablet    Refill:  1    Fill one day early if pharmacy is closed on scheduled refill date. Do not fill until: 03/16/2017 To last until: 05/15/2017    Order Specific Question:   Supervising Provider    Answer:   Milinda Pointer [phone]   New Prescriptions   No medications on file   Medications administered today: Seynabou Boykin had no medications administered during this visit. Lab-work, procedure(s), and/or referral(s): Orders Placed This Encounter  Procedures  . Lumbar Transforaminal Epidural   Imaging and/or referral(s): None   Interventional therapies: Planned, scheduled, and/or pending: Diagnostic bilateral lumbar facet block + BL SI joint injection    Considering: Diagnosticbilateral lumbar epidural steroid injection Diagnostic bilateral lumbar facet block  Possible Bilateral lumbar facet NBRFA Diagnosticleft hip injection Diagnosticleft femoral nerve + obturator nerve block  Diagnostic left knee intra-articular steroid injection Diagnosticleft genicular nerve block Possible left knee RFA  Diagnosticcervical epidural steroid injection Diagnosticbilateral cervical facet block Possiblebilateral cervical RFA       Provider-requested follow-up: Return in about 2 months (around 05/11/2017) for MedMgmt with Me Dionisio David), Procedure(w/Sedation), w/ Dr. Dossie Arbour, ([employer]).  Future Appointments  Date Time Provider [place]  03/15/2017  2:20 PM Mikey College, NP [facility] None  05/09/2017  9:00 AM Vevelyn Francois, NP [facility] None   Primary Care Physician: Cletis Athens, MD Location: [facility] Outpatient Pain Management Facility Note by: Vevelyn Francois NP Date: 03/11/2017; Time: 1:51 PM  Pain Score Disclaimer: We use the NRS-11 scale. This is a self-reported, subjective measurement of pain severity with only modest accuracy. It is used primarily to identify changes within a particular patient. It must be understood that outpatient pain scales are significantly less accurate that those used for research, where they can be applied under ideal controlled circumstances with minimal exposure to variables. In reality, the score is likely to be a combination of pain intensity and pain affect, where pain affect describes the degree of emotional arousal or changes in action readiness caused by the sensory experience of pain. Factors such as social and work situation, setting, emotional state, anxiety levels, expectation, and prior pain experience may influence pain perception and show large inter-individual differences that may also be affected by time variables.  Patient instructions provided during this appointment: Patient  Instructions  ____________________________________________________________________________________________  Medication Rules  Applies to: All patients receiving prescriptions (written or electronic).  Pharmacy of record: Pharmacy where electronic prescriptions will be sent. If written prescriptions are  taken to a different pharmacy, please inform the nursing staff. The pharmacy listed in the electronic medical record should be the one where you would like electronic prescriptions to be sent.  Prescription refills: Only during scheduled appointments. Applies to both, written and electronic prescriptions.  NOTE: The following applies primarily to controlled substances (Opioid* Pain Medications).   Patient's responsibilities: 1. Pain Pills: Bring all pain pills to every appointment (except for procedure appointments). 2. Pill Bottles: Bring pills in original pharmacy bottle. Always bring newest bottle. Bring bottle, even if empty. 3. Medication refills: You are responsible for knowing and keeping track of what medications you need refilled. The day before your appointment, write a list of all prescriptions that need to be refilled. Bring that list to your appointment and give it to the admitting nurse. Prescriptions will be written only during appointments. If you forget a medication, it will not be "Called in", "Faxed", or "electronically sent". You will need to get another appointment to get these prescribed. 4. Prescription Accuracy: You are responsible for carefully inspecting your prescriptions before leaving our office. Have the discharge nurse carefully go over each prescription with you, before taking them home. Make sure that your name is accurately spelled, that your address is correct. Check the name and dose of your medication to make sure it is accurate. Check the number of pills, and the written instructions to make sure they are clear and accurate. Make sure that you are given enough  medication to last until your next medication refill appointment. 5. Taking Medication: Take medication as prescribed. Never take more pills than instructed. Never take medication more frequently than prescribed. Taking less pills or less frequently is permitted and encouraged, when it comes to controlled substances (written prescriptions).  6. Inform other Doctors: Always inform, all of your healthcare providers, of all the medications you take. 7. Pain Medication from other Providers: You are not allowed to accept any additional pain medication from any other Doctor or Healthcare provider. There are two exceptions to this rule. (see below) In the event that you require additional pain medication, you are responsible for notifying us, as stated below. 8. Medication Agreement: You are responsible for carefully reading and following our Medication Agreement. This must be signed before receiving any prescriptions from our practice. Safely store a copy of your signed Agreement. Violations to the Agreement will result in no further prescriptions. (Additional copies of our Medication Agreement are available upon request.) 9. Laws, Rules, & Regulations: All patients are expected to follow all Federal and [employer], [employer], Rules, [employer]. Ignorance of the Laws does not constitute a valid excuse. The use of any illegal substances is prohibited. 10. Adopted CDC guidelines & recommendations: Target dosing levels will be at or below 60 MME/day. Use of benzodiazepines** is not recommended.  Exceptions: There are only two exceptions to the rule of not receiving pain medications from other Healthcare Providers. 1. Exception #1 (Emergencies): In the event of an emergency (i.e.: accident requiring emergency care), you are allowed to receive additional pain medication. However, you are responsible for: As soon as you are able, call our office [phone], at any time of the day or night, and leave a message  stating your name, the date and nature of the emergency, and the name and dose of the medication prescribed. In the event that your call is answered by a member of our staff, make sure to document and save the date, time, and the name of the  person that took your information.  2. Exception #2 (Planned Surgery): In the event that you are scheduled by another doctor or dentist to have any type of surgery or procedure, you are allowed (for a period no longer than 30 days), to receive additional pain medication, for the acute post-op pain. However, in this case, you are responsible for picking up a copy of our "Post-op Pain Management for Surgeons" handout, and giving it to your surgeon or dentist. This document is available at our office, and does not require an appointment to obtain it. Simply go to our office during business hours (Monday-Thursday from 8:00 AM to 4:00 PM) (Friday 8:00 AM to 12:00 Noon) or if you have a scheduled appointment with [place], prior to your surgery, and ask for it by name. In addition, you will need to provide [place] with your name, name of your surgeon, type of surgery, and date of procedure or surgery.  *Opioid medications include: morphine, codeine, oxycodone, oxymorphone, hydrocodone, hydromorphone, meperidine, tramadol, tapentadol, buprenorphine, fentanyl, methadone. **Benzodiazepine medications include: diazepam (Valium), alprazolam (Xanax), clonazepam (Klonopine), lorazepam (Ativan), clorazepate (Tranxene), chlordiazepoxide (Librium), estazolam (Prosom), oxazepam (Serax), temazepam (Restoril), triazolam (Halcion) (Last updated: 03/07/2017) ____________________________________________________________________________________________   ____________________________________________________________________________________________  General Risks and Possible Complications  Patient Responsibilities: It is important that you read this as it is part of your informed consent. It is our  duty to inform you of the risks and possible complications associated with treatments offered to you. It is your responsibility as a patient to read this and to ask questions about anything that is not clear or that you believe was not covered in this document.  Patient's Rights: You have the right to refuse treatment. You also have the right to change your mind, even after initially having agreed to have the treatment done. However, under this last option, if you wait until the last second to change your mind, you may be charged for the materials used up to that point.  Introduction: Medicine is not an Chief Strategy Officer. Everything in Medicine, including the lack of treatment(s), carries the potential for danger, harm, or loss (which is by definition: Risk). In Medicine, a complication is a secondary problem, condition, or disease that can aggravate an already existing one. All treatments carry the risk of possible complications. The fact that a side effects or complications occurs, does not imply that the treatment was conducted incorrectly. It must be clearly understood that these can happen even when everything is done following the highest safety standards.  No treatment: You can choose not to proceed with the proposed treatment alternative. The "PRO(s)" would include: avoiding the risk of complications associated with the therapy. The "CON(s)" would include: not getting any of the treatment benefits. These benefits fall under one of three categories: diagnostic; therapeutic; and/or palliative. Diagnostic benefits include: getting information which can ultimately lead to improvement of the disease or symptom(s). Therapeutic benefits are those associated with the successful treatment of the disease. Finally, palliative benefits are those related to the decrease of the primary symptoms, without necessarily curing the condition (example: decreasing the pain from a flare-up of a chronic condition, such as  incurable terminal cancer).  General Risks and Complications: These are associated to most interventional treatments. They can occur alone, or in combination. They fall under one of the following six (6) categories: no benefit or worsening of symptoms; bleeding; infection; nerve damage; allergic reactions; and/or death. 1. No benefits or worsening of symptoms: In Medicine there are no guarantees, only probabilities.  No healthcare provider can ever guarantee that a medical treatment will work, they can only state the probability that it may. Furthermore, there is always the possibility that the condition may worsen, either directly, or indirectly, as a consequence of the treatment. 2. Bleeding: This is more common if the patient is taking a blood thinner, either prescription or over the counter (example: Goody Powders, Fish oil, Aspirin, Garlic, etc.), or if suffering a condition associated with impaired coagulation (example: Hemophilia, cirrhosis of the liver, low platelet counts, etc.). However, even if you do not have one on these, it can still happen. If you have any of these conditions, or take one of these drugs, make sure to notify your treating physician. 3. Infection: This is more common in patients with a compromised immune system, either due to disease (example: diabetes, cancer, human immunodeficiency virus [HIV], etc.), or due to medications or treatments (example: therapies used to treat cancer and rheumatological diseases). However, even if you do not have one on these, it can still happen. If you have any of these conditions, or take one of these drugs, make sure to notify your treating physician. 4. Nerve Damage: This is more common when the treatment is an invasive one, but it can also happen with the use of medications, such as those used in the treatment of cancer. The damage can occur to small secondary nerves, or to large primary ones, such as those in the spinal cord and brain. This  damage may be temporary or permanent and it may lead to impairments that can range from temporary numbness to permanent paralysis and/or brain death. 5. Allergic Reactions: Any time a substance or material comes in contact with our body, there is the possibility of an allergic reaction. These can range from a mild skin rash (contact dermatitis) to a severe systemic reaction (anaphylactic reaction), which can result in death. 6. Death: In general, any medical intervention can result in death, most of the time due to an unforeseen complication. ____________________________________________________________________________________________  Epidural Steroid Injection Patient Information  Description: The epidural space surrounds the nerves as they exit the spinal cord.  In some patients, the nerves can be compressed and inflamed by a bulging disc or a tight spinal canal (spinal stenosis).  By injecting steroids into the epidural space, we can bring irritated nerves into direct contact with a potentially helpful medication.  These steroids act directly on the irritated nerves and can reduce swelling and inflammation which often leads to decreased pain.  Epidural steroids may be injected anywhere along the spine and from the neck to the low back depending upon the location of your pain.   After numbing the skin with local anesthetic (like Novocaine), a small needle is passed into the epidural space slowly.  You may experience a sensation of pressure while this is being done.  The entire block usually last less than 10 minutes.  Conditions which may be treated by epidural steroids:   Low back and leg pain  Neck and arm pain  Spinal stenosis  Post-laminectomy syndrome  Herpes zoster (shingles) pain  Pain from compression fractures  Preparation for the injection:  3. Do not eat any solid food or dairy products within 8 hours of your appointment.  4. You may drink clear liquids up to 3 hours before  appointment.  Clear liquids include water, black coffee, juice or soda.  No milk or cream please. 5. You may take your regular medication, including pain medications, with a sip of  water before your appointment  Diabetics should hold regular insulin (if taken separately) and take 1/2 normal NPH dos the morning of the procedure.  Carry some sugar containing items with you to your appointment. 6. A driver must accompany you and be prepared to drive you home after your procedure.  7. Bring all your current medications with your. 8. An IV may be inserted and sedation may be given at the discretion of the physician.   9. A blood pressure cuff, EKG and other monitors will often be applied during the procedure.  Some patients may need to have extra oxygen administered for a short period. 10. You will be asked to provide medical information, including your allergies, prior to the procedure.  We must know immediately if you are taking blood thinners (like Coumadin/Warfarin)  Or if you are allergic to IV iodine contrast (dye). We must know if you could possible be pregnant.  Possible side-effects:  Bleeding from needle site  Infection (rare, may require surgery)  Nerve injury (rare)  Numbness & tingling (temporary)  Difficulty urinating (rare, temporary)  Spinal headache ( a headache worse with upright posture)  Light -headedness (temporary)  Pain at injection site (several days)  Decreased blood pressure (temporary)  Weakness in arm/leg (temporary)  Pressure sensation in back/neck (temporary)  Call if you experience:  Fever/chills associated with headache or increased back/neck pain.  Headache worsened by an upright position.  New onset weakness or numbness of an extremity below the injection site  Hives or difficulty breathing (go to the emergency room)  Inflammation or drainage at the infection site  Severe back/neck pain  Any new symptoms which are concerning to you  Please  note:  Although the local anesthetic injected can often make your back or neck feel good for several hours after the injection, the pain will likely return.  It takes 3-7 days for steroids to work in the epidural space.  You may not notice any pain relief for at least that one week.  If effective, we will often do a series of three injections spaced 3-6 weeks apart to maximally decrease your pain.  After the initial series, we generally will wait several months before considering a repeat injection of the same type.  If you have any questions, please call [phone] [facility]

## 2017-03-11 NOTE — Progress Notes (Signed)
Nursing Pain Medication Assessment:  Safety precautions to be maintained throughout the outpatient stay will include: orient to surroundings, keep bed in low position, maintain call bell within reach at all times, provide assistance with transfer out of bed and ambulation.  Medication Inspection Compliance: Pill count conducted under aseptic conditions, in front of the patient. Neither the pills nor the bottle was removed from the patient's sight at any time. Once count was completed pills were immediately returned to the patient in their original bottle.  Medication: Tramadol (Ultram) Pill/Patch Count: 7 of 120 pills remain Pill/Patch Appearance: Markings consistent with prescribed medication Bottle Appearance: Standard pharmacy container. Clearly labeled. Filled Date: 2 / 7 / 2019 Last Medication intake:  Today

## 2017-03-11 NOTE — Patient Instructions (Addendum)
____________________________________________________________________________________________  Medication Rules  Applies to: All patients receiving prescriptions (written or electronic).  Pharmacy of record: Pharmacy where electronic prescriptions will be sent. If written prescriptions are taken to a different pharmacy, please inform the nursing staff. The pharmacy listed in the electronic medical record should be the one where you would like electronic prescriptions to be sent.  Prescription refills: Only during scheduled appointments. Applies to both, written and electronic prescriptions.  NOTE: The following applies primarily to controlled substances (Opioid* Pain Medications).   Patient's responsibilities: 1. Pain Pills: Bring all pain pills to every appointment (except for procedure appointments). 2. Pill Bottles: Bring pills in original pharmacy bottle. Always bring newest bottle. Bring bottle, even if empty. 3. Medication refills: You are responsible for knowing and keeping track of what medications you need refilled. The day before your appointment, write a list of all prescriptions that need to be refilled. Bring that list to your appointment and give it to the admitting nurse. Prescriptions will be written only during appointments. If you forget a medication, it will not be "Called in", "Faxed", or "electronically sent". You will need to get another appointment to get these prescribed. 4. Prescription Accuracy: You are responsible for carefully inspecting your prescriptions before leaving our office. Have the discharge nurse carefully go over each prescription with you, before taking them home. Make sure that your name is accurately spelled, that your address is correct. Check the name and dose of your medication to make sure it is accurate. Check the number of pills, and the written instructions to make sure they are clear and accurate. Make sure that you are given enough medication to last  until your next medication refill appointment. 5. Taking Medication: Take medication as prescribed. Never take more pills than instructed. Never take medication more frequently than prescribed. Taking less pills or less frequently is permitted and encouraged, when it comes to controlled substances (written prescriptions).  6. Inform other Doctors: Always inform, all of your healthcare providers, of all the medications you take. 7. Pain Medication from other Providers: You are not allowed to accept any additional pain medication from any other Doctor or Healthcare provider. There are two exceptions to this rule. (see below) In the event that you require additional pain medication, you are responsible for notifying us, as stated below. 8. Medication Agreement: You are responsible for carefully reading and following our Medication Agreement. This must be signed before receiving any prescriptions from our practice. Safely store a copy of your signed Agreement. Violations to the Agreement will result in no further prescriptions. (Additional copies of our Medication Agreement are available upon request.) 9. Laws, Rules, & Regulations: All patients are expected to follow all Federal and State Laws, Statutes, Rules, & Regulations. Ignorance of the Laws does not constitute a valid excuse. The use of any illegal substances is prohibited. 10. Adopted CDC guidelines & recommendations: Target dosing levels will be at or below 60 MME/day. Use of benzodiazepines** is not recommended.  Exceptions: There are only two exceptions to the rule of not receiving pain medications from other Healthcare Providers. 1. Exception #1 (Emergencies): In the event of an emergency (i.e.: accident requiring emergency care), you are allowed to receive additional pain medication. However, you are responsible for: As soon as you are able, call our office [phone], at any time of the day or night, and leave a message stating your name, the  date and nature of the emergency, and the name and dose of the medication   prescribed. In the event that your call is answered by a member of our staff, make sure to document and save the date, time, and the name of the person that took your information.  2. Exception #2 (Planned Surgery): In the event that you are scheduled by another doctor or dentist to have any type of surgery or procedure, you are allowed (for a period no longer than 30 days), to receive additional pain medication, for the acute post-op pain. However, in this case, you are responsible for picking up a copy of our "Post-op Pain Management for Surgeons" handout, and giving it to your surgeon or dentist. This document is available at our office, and does not require an appointment to obtain it. Simply go to our office during business hours (Monday-Thursday from 8:00 AM to 4:00 PM) (Friday 8:00 AM to 12:00 Noon) or if you have a scheduled appointment with us, prior to your surgery, and ask for it by name. In addition, you will need to provide us with your name, name of your surgeon, type of surgery, and date of procedure or surgery.  *Opioid medications include: morphine, codeine, oxycodone, oxymorphone, hydrocodone, hydromorphone, meperidine, tramadol, tapentadol, buprenorphine, fentanyl, methadone. **Benzodiazepine medications include: diazepam (Valium), alprazolam (Xanax), clonazepam (Klonopine), lorazepam (Ativan), clorazepate (Tranxene), chlordiazepoxide (Librium), estazolam (Prosom), oxazepam (Serax), temazepam (Restoril), triazolam (Halcion) (Last updated: 03/07/2017) ____________________________________________________________________________________________   ____________________________________________________________________________________________  General Risks and Possible Complications  Patient Responsibilities: It is important that you read this as it is part of your informed consent. It is our duty to inform you of the  risks and possible complications associated with treatments offered to you. It is your responsibility as a patient to read this and to ask questions about anything that is not clear or that you believe was not covered in this document.  Patient's Rights: You have the right to refuse treatment. You also have the right to change your mind, even after initially having agreed to have the treatment done. However, under this last option, if you wait until the last second to change your mind, you may be charged for the materials used up to that point.  Introduction: Medicine is not an exact science. Everything in Medicine, including the lack of treatment(s), carries the potential for danger, harm, or loss (which is by definition: Risk). In Medicine, a complication is a secondary problem, condition, or disease that can aggravate an already existing one. All treatments carry the risk of possible complications. The fact that a side effects or complications occurs, does not imply that the treatment was conducted incorrectly. It must be clearly understood that these can happen even when everything is done following the highest safety standards.  No treatment: You can choose not to proceed with the proposed treatment alternative. The "PRO(s)" would include: avoiding the risk of complications associated with the therapy. The "CON(s)" would include: not getting any of the treatment benefits. These benefits fall under one of three categories: diagnostic; therapeutic; and/or palliative. Diagnostic benefits include: getting information which can ultimately lead to improvement of the disease or symptom(s). Therapeutic benefits are those associated with the successful treatment of the disease. Finally, palliative benefits are those related to the decrease of the primary symptoms, without necessarily curing the condition (example: decreasing the pain from a flare-up of a chronic condition, such as incurable terminal  cancer).  General Risks and Complications: These are associated to most interventional treatments. They can occur alone, or in combination. They fall under one of the following six (6) categories:   no benefit or worsening of symptoms; bleeding; infection; nerve damage; allergic reactions; and/or death. 1. No benefits or worsening of symptoms: In Medicine there are no guarantees, only probabilities. No healthcare provider can ever guarantee that a medical treatment will work, they can only state the probability that it may. Furthermore, there is always the possibility that the condition may worsen, either directly, or indirectly, as a consequence of the treatment. 2. Bleeding: This is more common if the patient is taking a blood thinner, either prescription or over the counter (example: Goody Powders, Fish oil, Aspirin, Garlic, etc.), or if suffering a condition associated with impaired coagulation (example: Hemophilia, cirrhosis of the liver, low platelet counts, etc.). However, even if you do not have one on these, it can still happen. If you have any of these conditions, or take one of these drugs, make sure to notify your treating physician. 3. Infection: This is more common in patients with a compromised immune system, either due to disease (example: diabetes, cancer, human immunodeficiency virus [HIV], etc.), or due to medications or treatments (example: therapies used to treat cancer and rheumatological diseases). However, even if you do not have one on these, it can still happen. If you have any of these conditions, or take one of these drugs, make sure to notify your treating physician. 4. Nerve Damage: This is more common when the treatment is an invasive one, but it can also happen with the use of medications, such as those used in the treatment of cancer. The damage can occur to small secondary nerves, or to large primary ones, such as those in the spinal cord and brain. This damage may be temporary  or permanent and it may lead to impairments that can range from temporary numbness to permanent paralysis and/or brain death. 5. Allergic Reactions: Any time a substance or material comes in contact with our body, there is the possibility of an allergic reaction. These can range from a mild skin rash (contact dermatitis) to a severe systemic reaction (anaphylactic reaction), which can result in death. 6. Death: In general, any medical intervention can result in death, most of the time due to an unforeseen complication. ____________________________________________________________________________________________  Epidural Steroid Injection Patient Information  Description: The epidural space surrounds the nerves as they exit the spinal cord.  In some patients, the nerves can be compressed and inflamed by a bulging disc or a tight spinal canal (spinal stenosis).  By injecting steroids into the epidural space, we can bring irritated nerves into direct contact with a potentially helpful medication.  These steroids act directly on the irritated nerves and can reduce swelling and inflammation which often leads to decreased pain.  Epidural steroids may be injected anywhere along the spine and from the neck to the low back depending upon the location of your pain.   After numbing the skin with local anesthetic (like Novocaine), a small needle is passed into the epidural space slowly.  You may experience a sensation of pressure while this is being done.  The entire block usually last less than 10 minutes.  Conditions which may be treated by epidural steroids:   Low back and leg pain  Neck and arm pain  Spinal stenosis  Post-laminectomy syndrome  Herpes zoster (shingles) pain  Pain from compression fractures  Preparation for the injection:  3. Do not eat any solid food or dairy products within 8 hours of your appointment.  4. You may drink clear liquids up to 3 hours before appointment.  Clear    liquids include water, black coffee, juice or soda.  No milk or cream please. 5. You may take your regular medication, including pain medications, with a sip of water before your appointment  Diabetics should hold regular insulin (if taken separately) and take 1/2 normal NPH dos the morning of the procedure.  Carry some sugar containing items with you to your appointment. 6. A driver must accompany you and be prepared to drive you home after your procedure.  7. Bring all your current medications with your. 8. An IV may be inserted and sedation may be given at the discretion of the physician.   9. A blood pressure cuff, EKG and other monitors will often be applied during the procedure.  Some patients may need to have extra oxygen administered for a short period. 10. You will be asked to provide medical information, including your allergies, prior to the procedure.  We must know immediately if you are taking blood thinners (like Coumadin/Warfarin)  Or if you are allergic to IV iodine contrast (dye). We must know if you could possible be pregnant.  Possible side-effects:  Bleeding from needle site  Infection (rare, may require surgery)  Nerve injury (rare)  Numbness & tingling (temporary)  Difficulty urinating (rare, temporary)  Spinal headache ( a headache worse with upright posture)  Light -headedness (temporary)  Pain at injection site (several days)  Decreased blood pressure (temporary)  Weakness in arm/leg (temporary)  Pressure sensation in back/neck (temporary)  Call if you experience:  Fever/chills associated with headache or increased back/neck pain.  Headache worsened by an upright position.  New onset weakness or numbness of an extremity below the injection site  Hives or difficulty breathing (go to the emergency room)  Inflammation or drainage at the infection site  Severe back/neck pain  Any new symptoms which are concerning to you  Please note:  Although  the local anesthetic injected can often make your back or neck feel good for several hours after the injection, the pain will likely return.  It takes 3-7 days for steroids to work in the epidural space.  You may not notice any pain relief for at least that one week.  If effective, we will often do a series of three injections spaced 3-6 weeks apart to maximally decrease your pain.  After the initial series, we generally will wait several months before considering a repeat injection of the same type.  If you have any questions, please call [phone] [facility] 

## 2017-03-14 DIAGNOSIS — S92354D Nondisplaced fracture of fifth metatarsal bone, right foot, subsequent encounter for fracture with routine healing: Secondary | ICD-10-CM | POA: Diagnosis not present

## 2017-03-15 ENCOUNTER — Encounter: Payer: Self-pay | Admitting: Family Medicine

## 2017-03-15 ENCOUNTER — Ambulatory Visit (INDEPENDENT_AMBULATORY_CARE_PROVIDER_SITE_OTHER): Payer: Medicare HMO | Admitting: Family Medicine

## 2017-03-15 VITALS — BP 131/61 | HR 59 | Temp 98.5°F | Resp 16 | Ht 62.0 in | Wt 181.4 lb

## 2017-03-15 DIAGNOSIS — R110 Nausea: Secondary | ICD-10-CM

## 2017-03-15 DIAGNOSIS — Z7689 Persons encountering health services in other specified circumstances: Secondary | ICD-10-CM | POA: Diagnosis not present

## 2017-03-15 DIAGNOSIS — Z8619 Personal history of other infectious and parasitic diseases: Secondary | ICD-10-CM | POA: Insufficient documentation

## 2017-03-15 DIAGNOSIS — J321 Chronic frontal sinusitis: Secondary | ICD-10-CM | POA: Diagnosis not present

## 2017-03-15 MED ORDER — IPRATROPIUM BROMIDE 0.06 % NA SOLN: 2.0000 | Freq: Four times a day (QID) | NASAL | 0 refills | Status: DC

## 2017-03-15 MED ORDER — AMOXICILLIN-POT CLAVULANATE 875-125 MG PO TABS: 1.0000 | ORAL_TABLET | Freq: Two times a day (BID) | ORAL | 0 refills | Status: DC

## 2017-03-15 MED ORDER — ONDANSETRON 4 MG PO TBDP: 4.0000 mg | ORAL_TABLET | Freq: Three times a day (TID) | ORAL | 0 refills | Status: DC | PRN

## 2017-03-15 NOTE — Patient Instructions (Addendum)
Thank you for coming to the office today.  1. It sounds like you have a Sinusitis (Bacterial Infection) - this most likely started as an Upper Respiratory Virus that has settled into an infection. Allergies can also cause this. - Start Augmentin 1 pill twice daily (breakfast and dinner, with food and plenty of water) for 10 days, complete entire course, do not stop early even if feeling better  Continue Flonase  Start Atrovent nasal spray decongestant 2 sprays in each nostril up to 4 times daily for 7 days  Zofran as needed for nausea  - Improve hydration by drinking plenty of clear fluids (water, gatorade) to reduce secretions and thin congestion  - Congestion draining down throat can cause irritation. May try warm herbal tea with honey, cough drops  - Can take Tylenol or Ibuprofen as needed for fevers  If you develop persistent fever >101F for at least 3 consecutive days, headaches with sinus pain or pressure or persistent earache, please schedule a follow-up evaluation within next few days to week.  Recommend that your husband seeks care at an Urgent Care or Walk In to get treated for sinus or sick symptoms as well  Please schedule a Follow-up Appointment to: Return in about 3 weeks (around 04/05/2017) for Establish Care / [place].  If you have any other questions or concerns, please feel free to call the office or send a message through [place]. You may also schedule an earlier appointment if necessary.  Additionally, you may be receiving a survey about your experience at our office within a few days to 1 week by e-mail or mail. We value your feedback.  Nobie Putnam, DO [place]

## 2017-03-15 NOTE — Progress Notes (Signed)
Subjective:    Patient ID: Ashley Fields, female    DOB: Jan 10, 1955, 62 y.o.   MRN: [id number]  Ashley Fields is a 62 y.o. female presenting on 03/15/2017 for Cough (obtw pt had chills, bodyache, HA has sore throat on and off ear pain past 4 week OTC not improving)  Here to establish care with new PCP. She moved to [place] approx 8 months ago from [place] (previously lived most of her life there), she no longer follows other local doctor due to her preference. She moved to [facility] after she has re-married her 1st husband.  HPI   SINUSITIS Reports symptoms initially 4 weeks ago with sinus congestion and pressure and raw throat with ear pain and coughing, had thicker yellow sinus drainage and sometimes brown sputum. Her husband has also had similar sickness with URI flu like symptoms and some nausea vomiting and diarrhea - She has tried OTC treatments with theraflu, mucinex, Tylenol, Alka-Seltzer - some mild relief  - Admits some occasional chills, headaches and some body aches, she has some chronic headaches in past due to DJD and takes Fioricet PRN with relief - Admits nausea, some loose stools - Denies fevers, ear pain  History of Fractured R Foot (5th metatarsal) Reports recent history fractured her R foot when she accidentally tripped trying to stand up and inverted her foot and it "folded" her ankles were crossed when she tried to get up. She had a severe bilateral ankle sprain. She was followed by [facility], had a cast and no surgery, x 6 weeks, now has upcoming f/u.  Additional medical history as new patient: - Reports history of miscarriage age 62, had severe hemorrhages, later developed hydaditiform mole, required transfusions, and contracted Hep C from blood transfusions, this was not discovered until approx 3 years ago had chronic Hep C for many years. She was treated with [facility] in [place] and reports she was cured of Hep C and had undetectable viral load. They advised for Abdominal US q 6-12  months, she has had this surveillance and now says yearly [place].  - She saw local [facility] Dr Allen Norris in 01/2017, due to prior testing that showed positive antibody Hep C which is to be expected in history of chronic Hep C s/p treatment  - History of Chronic Pain: Followed by [facility], who prescribe Tramadol and Tizanidine. She has retained surgical hardware in cervical and lumbar spine  - Evaluated by [facility] eval for Pain Management consult - continued Zoloft, Trazodone, Lorazepam PRN. She was treated with Diazepam chronically in past for several years by PCP in [place], used for anxiety PRN, about x 1 weekly or less.   Depression screen [facility] 2/9 03/11/2017 02/11/2017 01/17/2017  Decreased Interest 0 1 0  Down, Depressed, Hopeless 0 1 0  PHQ - 2 Score 0 2 0  Altered sleeping 0 3 -  Tired, decreased energy 0 3 -  Change in appetite 0 3 -  Feeling bad or failure about yourself  0 0 -  Trouble concentrating 0 1 -  Moving slowly or fidgety/restless 0 0 -  Suicidal thoughts 0 0 -  PHQ-9 Score 0 12 -  Difficult doing work/chores Not difficult at all Somewhat difficult -    Past Medical History:  Diagnosis Date  . Allergy   . Anemia   . Chronic kidney disease   . Depression   . Disp fx of cuboid bone of right foot, init for clos fx 01/01/2017  .  GERD (gastroesophageal reflux disease)   . Headache   . Hepatitis C   . Hyperlipidemia   . Osteoporosis   . Thyroid disease   . Urine incontinence    Past Surgical History:  Procedure Laterality Date  . 5 miscarriages    . ABDOMINAL HYSTERECTOMY    . acrominoplasty  2015  . APPENDECTOMY    . breast lift bilateral, implants    . BREAST REDUCTION SURGERY    . CESAREAN SECTION    . CHOLECYSTECTOMY    . GASTRIC BYPASS  2005  . LUMBAR LAMINECTOMY  2008   l4-l5   . mini tummy tuck    . neck surgery C3-C7    . spinal stimulator removed 2012  2012   Social History   Socioeconomic History  . Marital status: Widowed     Spouse name: Not on file  . Number of children: Not on file  . Years of education: Not on file  . Highest education level: Not on file  Social Needs  . Financial resource strain: Not on file  . Food insecurity - worry: Not on file  . Food insecurity - inability: Not on file  . Transportation needs - medical: Not on file  . Transportation needs - non-medical: Not on file  Occupational History  . Not on file  Tobacco Use  . Smoking status: Former Smoker    Years: 4.00  . Smokeless tobacco: Never Used  Substance and Sexual Activity  . Alcohol use: No  . Drug use: No  . Sexual activity: Not on file  Other Topics Concern  . Not on file  Social History Narrative  . Not on file   Family History  Problem Relation Age of Onset  . COPD Mother   . Cancer Mother   . Heart disease Father    Current Outpatient Medications on File Prior to Visit  Medication Sig  . Biotin 10000 MCG TABS Take 10,000 mcg by mouth 1 day or 1 dose.  . butalbital-acetaminophen-caffeine (FIORICET, ESGIC) 50-325-40 MG tablet TAKE 1 TABLET BY MOUTH EVERY NIGHT AS NEEDED FOR HEADACHE  . estradiol (ESTRACE) 1 MG tablet Take 1 mg by mouth daily.  . ferrous sulfate 325 (65 FE) MG tablet Take 325 mg by mouth daily.  . fluticasone (FLONASE) 50 MCG/ACT nasal spray 1 SPRAY IN EACH NOSTRIL ONCE A DAY NASALLY 30 DAYS  . gabapentin (NEURONTIN) 300 MG capsule TAKE ONE CAPSULE BY MOUTH THREE times a day  . LORazepam (ATIVAN) 1 MG tablet Take 1 mg by mouth 3 (three) times daily.  . Melatonin 10 MG CAPS Take 1 capsule by mouth at bedtime.  . Multiple Vitamin (MULTIVITAMIN WITH MINERALS) TABS tablet Take 1 tablet by mouth daily.  Marland Kitchen omeprazole (PRILOSEC) 40 MG capsule Take 40 mg by mouth daily.  Marland Kitchen Phenylephrine-DM-GG-APAP (MUCINEX FAST-MAX COLD FLU PO) Take by mouth every 6 (six) hours as needed.  . sertraline (ZOLOFT) 100 MG tablet Take 200 mg by mouth 2 (two) times daily.   . simvastatin (ZOCOR) 20 MG tablet TAKE 1 TABLET IN  THE EVENING ONCE A DAY ORALLY 90 DAYS  . [START ON 03/16/2017] tiZANidine (ZANAFLEX) 4 MG tablet Take 1 tablet (4 mg total) by mouth 3 (three) times daily.  Derrill Memo ON 03/16/2017] traMADol (ULTRAM) 50 MG tablet Take 1 tablet (50 mg total) by mouth every 6 (six) hours as needed for severe pain.  . traZODone (DESYREL) 150 MG tablet Take 150 mg by mouth at bedtime.  Marland Kitchen  Turmeric 500 MG CAPS Take 2 capsules by mouth daily.  . vitamin C (ASCORBIC ACID) 500 MG tablet Take 500 mg 2 (two) times daily by mouth.   No current facility-administered medications on file prior to visit.     Review of Systems Per HPI unless specifically indicated above     Objective:    BP 131/61   Pulse (!) 59   Temp 98.5 F (36.9 C) (Oral)   Resp 16   Ht 5\' 2"  (1.575 m)   Wt 181 lb 6.4 oz (82.3 kg)   SpO2 100%   BMI 33.18 kg/m   Wt Readings from Last 3 Encounters:  03/15/17 181 lb 6.4 oz (82.3 kg)  03/11/17 172 lb (78 kg)  02/11/17 174 lb (78.9 kg)    Physical Exam  Constitutional: She is oriented to person, place, and time. She appears well-developed and well-nourished. No distress.  Mildly ill and tired appearing, comfortable, cooperative  HENT:  Head: Normocephalic and atraumatic.  Mouth/Throat: Oropharynx is clear and moist.  Frontal sinuses tender. Nares mostly patent with some congestion without purulence or edema. Bilateral TMs with some slight effusion L>R and fullness without erythema or bulging. Oropharynx with some posterior erythema and mild drainage without exudates, edema or asymmetry.  Eyes: Conjunctivae are normal. Right eye exhibits no discharge. Left eye exhibits no discharge.  Neck: Normal range of motion. Neck supple. No thyromegaly present.  Cardiovascular: Normal rate, regular rhythm, normal heart sounds and intact distal pulses.  No murmur heard. Pulmonary/Chest: Effort normal and breath sounds normal. No respiratory distress. She has no wheezes. She has no rales.  Musculoskeletal: She  exhibits no edema.  Uses a cane to assist with ambulation  Lymphadenopathy:    She has no cervical adenopathy.  Neurological: She is alert and oriented to person, place, and time.  Skin: Skin is warm and dry. No rash noted. She is not diaphoretic. No erythema.  Psychiatric: Her behavior is normal.  Well groomed, good eye contact, normal speech and thoughts  Nursing note and vitals reviewed.  Results for orders placed or performed in visit on 02/11/17  ToxASSURE Select 13 (MW), Urine  Result Value Ref Range   Summary FINAL       Assessment & Plan:   Problem List Items Addressed This Visit    None    Visit Diagnoses    Chronic frontal sinusitis    -  Primary   Relevant Medications   amoxicillin-clavulanate (AUGMENTIN) 875-125 MG tablet   ipratropium (ATROVENT) 0.06 % nasal spray   Encounter to establish care with new doctor Request records from prior PCP in [place]       Nausea        Relevant Medications   ondansetron (ZOFRAN ODT) 4 MG disintegrating tablet     Consistent with subacute vs recurrent frontal sinusitis, likely initially viral URI vs allergic rhinitis component with worsening concern for bacterial infection since 2nd sickening, duration over 4 weeks  Plan: 1. Start Augmentin 875-125mg  PO BID x 10 days 2. Start Atrovent nasal spray decongestant 2 sprays in each nostril up to 4 times daily for 7 days 3. Rx Zofran ODT PRN nausea 4. Supportive care with nasal saline OTC, hydration 5. May try OTC medicines as well for congestion / Tylenol 6. Return criteria reviewed   Meds ordered this encounter  Medications  . amoxicillin-clavulanate (AUGMENTIN) 875-125 MG tablet    Sig: Take 1 tablet by mouth 2 (two) times daily. For 10 days  Dispense:  20 tablet    Refill:  0  . ondansetron (ZOFRAN ODT) 4 MG disintegrating tablet    Sig: Take 1 tablet (4 mg total) by mouth every 8 (eight) hours as needed for nausea or vomiting.    Dispense:  30 tablet    Refill:  0    . ipratropium (ATROVENT) 0.06 % nasal spray    Sig: Place 2 sprays into both nostrils 4 (four) times daily. For up to 5-7 days then stop.    Dispense:  15 mL    Refill:  0     Follow up plan: Return in about 3 weeks (around 04/05/2017) for Establish Care / Refills.  Nobie Putnam, [facility] 03/15/2017, 11:06 PM

## 2017-03-21 ENCOUNTER — Encounter: Payer: Self-pay | Admitting: Family Medicine

## 2017-03-21 DIAGNOSIS — K760 Fatty (change of) liver, not elsewhere classified: Secondary | ICD-10-CM | POA: Insufficient documentation

## 2017-03-21 DIAGNOSIS — Z87442 Personal history of urinary calculi: Secondary | ICD-10-CM | POA: Insufficient documentation

## 2017-03-26 ENCOUNTER — Encounter: Payer: Self-pay | Admitting: Pain Medicine

## 2017-03-26 ENCOUNTER — Ambulatory Visit
Admission: RE | Admit: 2017-03-26 | Discharge: 2017-03-26 | Disposition: A | Discharge status: Home / Self Care | Payer: Medicare HMO | Source: Ambulatory Visit | Attending: Pain Medicine | Admitting: Pain Medicine

## 2017-03-26 ENCOUNTER — Ambulatory Visit (HOSPITAL_BASED_OUTPATIENT_CLINIC_OR_DEPARTMENT_OTHER): Payer: Medicare HMO | Admitting: Pain Medicine

## 2017-03-26 VITALS — BP 152/84 | Temp 98.2°F | Resp 18 | Ht 62.0 in | Wt 170.0 lb

## 2017-03-26 DIAGNOSIS — M79604 Pain in right leg: Secondary | ICD-10-CM

## 2017-03-26 DIAGNOSIS — Z881 Allergy status to other antibiotic agents status: Secondary | ICD-10-CM | POA: Diagnosis not present

## 2017-03-26 DIAGNOSIS — M48061 Spinal stenosis, lumbar region without neurogenic claudication: Secondary | ICD-10-CM | POA: Insufficient documentation

## 2017-03-26 DIAGNOSIS — Z9689 Presence of other specified functional implants: Secondary | ICD-10-CM | POA: Insufficient documentation

## 2017-03-26 DIAGNOSIS — M5136 Other intervertebral disc degeneration, lumbar region: Secondary | ICD-10-CM | POA: Diagnosis not present

## 2017-03-26 DIAGNOSIS — M48062 Spinal stenosis, lumbar region with neurogenic claudication: Secondary | ICD-10-CM | POA: Insufficient documentation

## 2017-03-26 DIAGNOSIS — M79605 Pain in left leg: Secondary | ICD-10-CM | POA: Insufficient documentation

## 2017-03-26 DIAGNOSIS — Z9884 Bariatric surgery status: Secondary | ICD-10-CM | POA: Insufficient documentation

## 2017-03-26 DIAGNOSIS — M9983 Other biomechanical lesions of lumbar region: Secondary | ICD-10-CM | POA: Diagnosis not present

## 2017-03-26 DIAGNOSIS — M545 Low back pain: Secondary | ICD-10-CM | POA: Diagnosis not present

## 2017-03-26 DIAGNOSIS — G8929 Other chronic pain: Secondary | ICD-10-CM

## 2017-03-26 DIAGNOSIS — E119 Type 2 diabetes mellitus without complications: Secondary | ICD-10-CM | POA: Insufficient documentation

## 2017-03-26 DIAGNOSIS — M47816 Spondylosis without myelopathy or radiculopathy, lumbar region: Secondary | ICD-10-CM

## 2017-03-26 DIAGNOSIS — M5441 Lumbago with sciatica, right side: Secondary | ICD-10-CM

## 2017-03-26 DIAGNOSIS — M5442 Lumbago with sciatica, left side: Secondary | ICD-10-CM

## 2017-03-26 MED ORDER — ROPIVACAINE HCL 2 MG/ML IJ SOLN
1.0000 mL | Freq: Once | INTRAMUSCULAR | Status: AC
  Administered 2017-03-26: 1 mL via EPIDURAL

## 2017-03-26 MED ORDER — IOPAMIDOL (ISOVUE-M 200) INJECTION 41%
10.0000 mL | Freq: Once | INTRAMUSCULAR | Status: AC
  Administered 2017-03-26: 10 mL via EPIDURAL
  Filled 2017-03-26: qty 10

## 2017-03-26 MED ORDER — SODIUM CHLORIDE 0.9% FLUSH
1.0000 mL | Freq: Once | INTRAVENOUS | Status: AC
  Administered 2017-03-26: 1 mL

## 2017-03-26 MED ORDER — FENTANYL CITRATE (PF) 100 MCG/2ML IJ SOLN
25.0000 ug | INTRAMUSCULAR | Status: DC | PRN
  Administered 2017-03-26: 100 ug via INTRAVENOUS

## 2017-03-26 MED ORDER — FENTANYL CITRATE (PF) 100 MCG/2ML IJ SOLN
INTRAMUSCULAR | Status: AC
  Filled 2017-03-26: qty 2

## 2017-03-26 MED ORDER — SODIUM CHLORIDE 0.9 % IJ SOLN
INTRAMUSCULAR | Status: AC
  Filled 2017-03-26: qty 10

## 2017-03-26 MED ORDER — ROPIVACAINE HCL 2 MG/ML IJ SOLN
1.0000 mL | Freq: Once | INTRAMUSCULAR | Status: AC
  Administered 2017-03-26: 1 mL via EPIDURAL
  Filled 2017-03-26: qty 10

## 2017-03-26 MED ORDER — LIDOCAINE HCL 2 % IJ SOLN
20.0000 mL | Freq: Once | INTRAMUSCULAR | Status: AC
  Administered 2017-03-26: 400 mg
  Filled 2017-03-26: qty 40

## 2017-03-26 MED ORDER — MIDAZOLAM HCL 5 MG/5ML IJ SOLN
INTRAMUSCULAR | Status: AC
  Filled 2017-03-26: qty 5

## 2017-03-26 MED ORDER — DEXAMETHASONE SODIUM PHOSPHATE 10 MG/ML IJ SOLN
10.0000 mg | Freq: Once | INTRAMUSCULAR | Status: AC
  Administered 2017-03-26: 10 mg
  Filled 2017-03-26: qty 1

## 2017-03-26 MED ORDER — LACTATED RINGERS IV SOLN
1000.0000 mL | Freq: Once | INTRAVENOUS | Status: AC
  Administered 2017-03-26: 1000 mL via INTRAVENOUS

## 2017-03-26 MED ORDER — MIDAZOLAM HCL 5 MG/5ML IJ SOLN
1.0000 mg | INTRAMUSCULAR | Status: DC | PRN
  Administered 2017-03-26: 4 mg via INTRAVENOUS

## 2017-03-26 NOTE — Addendum Note (Signed)
Addended by: Morley Kos on: 03/26/2017 02:44 PM   Modules accepted: Orders

## 2017-03-26 NOTE — Progress Notes (Signed)
Patient's Name: Ashley Fields  MRN: [id number]  Referring Provider: Vevelyn Francois, NP  DOB: 09-04-1955  PCP: Olin Hauser, DO  DOS: 03/26/2017  Note by: Gaspar Cola, MD  Service setting: Ambulatory outpatient  Specialty: Interventional Pain Management  Patient type: Established  Location: [facility] (AMB) Pain Management Facility  Visit type: Interventional Procedure   Primary Reason for Visit: Interventional Pain Management Treatment. CC: No chief complaint on file.  Procedure:  Anesthesia, Analgesia, Anxiolysis:  Type: Trans-Foraminal Epidural Steroid Injection Purpose: Diagnostic/Therapeutic Region: Posterolateral Lumbosacral Level: L3-4 Level Laterality: Bilateral Paravertebral Target Area: The 6 o'clock position under the pedicle, on the affected side. Approach: Posterior Percutaneous Paravertebral approach. Position: Prone  Type: Moderate (Conscious) Sedation combined with Local Anesthesia Indication(s): Analgesia and Anxiety Route: Intravenous (IV) IV Access: Secured Sedation: Meaningful verbal contact was maintained at all times during the procedure  Local Anesthetic: Lidocaine 1-2%   Indications: 1. DDD (degenerative disc disease), lumbar   2. Lumbar spinal stenosis (severe) (L3-4)   3. 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 Lumbar foraminal stenosis (L3-4) (Left)   5. Chronic lower extremity pain (Tertiary Area of Pain) (Bilateral) (L>R)   6. Chronic Low back pain (Primary Area of Pain) (Bilateral)  (R>L)    Pain Score: Pre-procedure: 4 /10 Post-procedure: 1 /10  Pre-op Assessment:  Ashley Fields is a 62 y.o. (year old), female patient, seen today for interventional treatment. She  has a past surgical history that includes Cesarean section (07/27/1981); Abdominal hysterectomy (1987); Breast reduction surgery (Bilateral, 1997); Cholecystectomy (03/2004); Appendectomy (12/2008); Gastric bypass (2005); mini tummy tuck (05/07/2016); breast lift  bilateral, implants (05/18/2015); Lumbar laminectomy (06/2006); 5 miscarriages; neck surgery C3-C7 (02/10/2015); IVC FILTER INSERTION (12/2003); Spinal cord stimulator implant (11/2010); and Shoulder acromioplasty (Left, 03/27/2013). Ashley Fields has a current medication list which includes the following prescription(s): amoxicillin-clavulanate, biotin, butalbital-acetaminophen-caffeine, estradiol, ferrous sulfate, fluticasone, gabapentin, ipratropium, lorazepam, melatonin, multivitamin with minerals, omeprazole, ondansetron, phenylephrine-dm-gg-apap, sertraline, simvastatin, tizanidine, tramadol, trazodone, turmeric, and vitamin c, and the following Facility-Administered Medications: fentanyl and midazolam. Her primarily concern today is the No chief complaint on file.  Initial Vital Signs:  Pulse Rate:   Temp: 97.8 F (36.6 C) Resp: 16 BP: 124/66 SpO2: 100 %  BMI: Estimated body mass index is 31.09 kg/m as calculated from the following:   Height as of this encounter: 5\' 2"  (1.575 m).   Weight as of this encounter: 170 lb (77.1 kg).  Risk Assessment: Allergies: Reviewed. She is allergic to flagyl [metronidazole].  Allergy Precautions: None required Coagulopathies: Reviewed. None identified.  Blood-thinner therapy: None at this time Active Infection(s): Reviewed. None identified. Ashley Fields is afebrile  Site Confirmation: Ashley Fields was asked to confirm the procedure and laterality before marking the site Procedure checklist: Completed Consent: Before the procedure and under the influence of no sedative(s), amnesic(s), or anxiolytics, the patient was informed of the treatment options, risks and possible complications. To fulfill our ethical and legal obligations, as recommended by the American Medical Association's Code of Ethics, I have informed the patient of my clinical impression; the nature and purpose of the treatment or procedure; the risks, benefits, and possible complications of the  intervention; the alternatives, including doing nothing; the risk(s) and benefit(s) of the alternative treatment(s) or procedure(s); and the risk(s) and benefit(s) of doing nothing. The patient was provided information about the general risks and possible complications associated with the procedure. These may include, but are not limited to: failure to achieve desired goals, infection, bleeding,  organ or nerve damage, allergic reactions, paralysis, and death. In addition, the patient was informed of those risks and complications associated to Spine-related procedures, such as failure to decrease pain; infection (i.e.: Meningitis, epidural or intraspinal abscess); bleeding (i.e.: epidural hematoma, subarachnoid hemorrhage, or any other type of intraspinal or peri-dural bleeding); organ or nerve damage (i.e.: Any type of peripheral nerve, nerve root, or spinal cord injury) with subsequent damage to sensory, motor, and/or autonomic systems, resulting in permanent pain, numbness, and/or weakness of one or several areas of the body; allergic reactions; (i.e.: anaphylactic reaction); and/or death. Furthermore, the patient was informed of those risks and complications associated with the medications. These include, but are not limited to: allergic reactions (i.e.: anaphylactic or anaphylactoid reaction(s)); adrenal axis suppression; blood sugar elevation that in diabetics may result in ketoacidosis or comma; water retention that in patients with history of congestive heart failure may result in shortness of breath, pulmonary edema, and decompensation with resultant heart failure; weight gain; swelling or edema; medication-induced neural toxicity; particulate matter embolism and blood vessel occlusion with resultant organ, and/or nervous system infarction; and/or aseptic necrosis of one or more joints. Finally, the patient was informed that Medicine is not an exact science; therefore, there is also the possibility of  unforeseen or unpredictable risks and/or possible complications that may result in a catastrophic outcome. The patient indicated having understood very clearly. We have given the patient no guarantees and we have made no promises. Enough time was given to the patient to ask questions, all of which were answered to the patient's satisfaction. Ashley Fields has indicated that she wanted to continue with the procedure. Attestation: I, the ordering provider, attest that I have discussed with the patient the benefits, risks, side-effects, alternatives, likelihood of achieving goals, and potential problems during recovery for the procedure that I have provided informed consent. Date  Time:   Pre-Procedure Preparation:  Monitoring: As per clinic protocol. Respiration, ETCO2, SpO2, BP, heart rate and rhythm monitor placed and checked for adequate function Safety Precautions: Patient was assessed for positional comfort and pressure points before starting the procedure. Time-out: I initiated and conducted the "Time-out" before starting the procedure, as per protocol. The patient was asked to participate by confirming the accuracy of the "Time Out" information. Verification of the correct person, site, and procedure were performed and confirmed by me, the nursing staff, and the patient. "Time-out" conducted as per Joint Commission's Universal Protocol (UP.01.01.01). Time: 1151  Description of Procedure:       Area Prepped: Entire Posterior Lumbosacral Area Prepping solution: ChloraPrep (2% chlorhexidine gluconate and 70% isopropyl alcohol) Safety Precautions: Aspiration looking for blood return was conducted prior to all injections. At no point did we inject any substances, as a needle was being advanced. No attempts were made at seeking any paresthesias. Safe injection practices and needle disposal techniques used. Medications properly checked for expiration dates. SDV (single dose vial) medications  used. Description of the Procedure: Protocol guidelines were followed. The patient was placed in position over the procedure table. The target area was identified and the area prepped in the usual manner. Skin desensitized using vapocoolant spray. Skin & deeper tissues infiltrated with local anesthetic. Appropriate amount of time allowed to pass for local anesthetics to take effect. The procedure needles were then advanced to the target area. Proper needle placement secured. Negative aspiration confirmed. Solution injected in intermittent fashion, asking for systemic symptoms every 0.5cc of injectate. The needles were then removed and the area cleansed, making  sure to leave some of the prepping solution back to take advantage of its long term bactericidal properties. Vitals:   03/26/17 1207 03/26/17 1217 03/26/17 1227 03/26/17 1237  BP: (!) 168/83 (!) 155/65 (!) 160/79 (!) 152/84  Resp: 10 12 16 18   Temp:  98.3 F (36.8 C)  98.2 F (36.8 C)  SpO2: 98% 95% 99% 98%  Weight:      Height:        Start Time: 1151 hrs. End Time: 1206 hrs. Materials:  Needle(s) Type: Regular needle Gauge: 22G Length: 5-in Medication(s): Please see orders for medications and dosing details.  Imaging Guidance (Spinal):  Type of Imaging Technique: Fluoroscopy Guidance (Spinal) Indication(s): Assistance in needle guidance and placement for procedures requiring needle placement in or near specific anatomical locations not easily accessible without such assistance. Exposure Time: Please see nurses notes. Contrast: Before injecting any contrast, we confirmed that the patient did not have an allergy to iodine, shellfish, or radiological contrast. Once satisfactory needle placement was completed at the desired level, radiological contrast was injected. Contrast injected under live fluoroscopy. No contrast complications. See chart for type and volume of contrast used. Fluoroscopic Guidance: I was personally present during  the use of fluoroscopy. "Tunnel Vision Technique" used to obtain the best possible view of the target area. Parallax error corrected before commencing the procedure. "Direction-depth-direction" technique used to introduce the needle under continuous pulsed fluoroscopy. Once target was reached, antero-posterior, oblique, and lateral fluoroscopic projection used confirm needle placement in all planes. Images permanently stored in EMR. Interpretation: I personally interpreted the imaging intraoperatively. Adequate needle placement confirmed in multiple planes. Appropriate spread of contrast into desired area was observed. No evidence of afferent or efferent intravascular uptake. No intrathecal or subarachnoid spread observed. Permanent images saved into the patient's record.  Flow of contrast observed today in the transforaminal epidural steroid injections would suggest the left L3-4 to be more stenotic than the right.  Antibiotic Prophylaxis:   Anti-infectives (From admission, onward)   None     Indication(s): None identified  Post-operative Assessment:  Post-procedure Vital Signs:  Pulse Rate:   Temp: 98.2 F (36.8 C) Resp: 18 BP: (!) 152/84 SpO2: 98 %  EBL: None  Complications: No immediate post-treatment complications observed by team, or reported by patient.  Note: The patient tolerated the entire procedure well. A repeat set of vitals were taken after the procedure and the patient was kept under observation following institutional policy, for this type of procedure. Post-procedural neurological assessment was performed, showing return to baseline, prior to discharge. The patient was provided with post-procedure discharge instructions, including a section on how to identify potential problems. Should any problems arise concerning this procedure, the patient was given instructions to immediately contact us, at any time, without hesitation. In any case, we plan to contact the patient by  telephone for a follow-up status report regarding this interventional procedure.  Comments:  No additional relevant information.  Plan of Care   Possible POC:  Today the patient came in for a diagnostic bilateral L3-4 transforaminal epidural steroid injection based on the patient's symptoms and MRI results.  The MRI indicates that the patient has some severe central spinal stenosis at the L3-4 level, just above where she had her prior surgery.  If the above transforaminal epidural steroid injections did not provide her with significant relief of the pain, we could attempt to do an interlaminar lumbar epidural steroid injection at either the L2-3 or L3-4 level.  Since the  patient's lower extremity symptoms seems to be worse on the left side, we would attempt to do this interlaminar epidural steroid injection aiming at the left side.  Again, this would depend on the results of today's transforaminal epidural steroid injection.    Imaging Orders     DG C-Arm 1-60 Min-No Report  Procedure Orders     Lumbar Transforaminal Epidural  Medications ordered for procedure: Meds ordered this encounter  Medications  . iopamidol (ISOVUE-M) 41 % intrathecal injection 10 mL    Must be Myelogram-compatible. If not available, you may substitute with a water-soluble, non-ionic, hypoallergenic, myelogram-compatible radiological contrast medium.  Marland Kitchen lidocaine (XYLOCAINE) 2 % (with pres) injection 400 mg  . midazolam (VERSED) 5 MG/5ML injection 1-2 mg    Make sure Flumazenil is available in the pyxis when using this medication. If oversedation occurs, administer 0.2 mg IV over 15 sec. If after 45 sec no response, administer 0.2 mg again over 1 min; may repeat at 1 min intervals; not to exceed 4 doses (1 mg)  . fentaNYL (SUBLIMAZE) injection 25-50 mcg    Make sure Narcan is available in the pyxis when using this medication. In the event of respiratory depression (RR< 8/min): Titrate NARCAN (naloxone) in increments  of 0.1 to 0.2 mg IV at 2-3 minute intervals, until desired degree of reversal.  . lactated ringers infusion 1,000 mL  . sodium chloride flush (NS) 0.9 % injection 1 mL  . ropivacaine (PF) 2 mg/mL (0.2%) (NAROPIN) injection 1 mL  . dexamethasone (DECADRON) injection 10 mg  . sodium chloride flush (NS) 0.9 % injection 1 mL  . ropivacaine (PF) 2 mg/mL (0.2%) (NAROPIN) injection 1 mL  . dexamethasone (DECADRON) injection 10 mg   Medications administered: We administered iopamidol, lidocaine, midazolam, fentaNYL, lactated ringers, sodium chloride flush, ropivacaine (PF) 2 mg/mL (0.2%), dexamethasone, sodium chloride flush, ropivacaine (PF) 2 mg/mL (0.2%), and dexamethasone.  See the medical record for exact dosing, route, and time of administration.  New Prescriptions   No medications on file   Disposition: Discharge home  Discharge Date & Time: 03/26/2017; 1240 hrs.   Physician-requested Follow-up: Return for post-procedure eval (2 wks), w/ Dr. Dossie Arbour.  Future Appointments  Date Time Provider [place]  04/04/2017  8:20 AM Olin Hauser, DO [place] None  04/17/2017  9:15 AM Milinda Pointer, MD [facility] None  05/09/2017  9:00 AM Vevelyn Francois, NP [facility] None   Primary Care Physician: Olin Hauser, DO Location: [facility] Facility Note by: Gaspar Cola, MD Date: 03/26/2017; Time: 12:53 PM  Disclaimer:  Medicine is not an Chief Strategy Officer. The only guarantee in medicine is that nothing is guaranteed. It is important to note that the decision to proceed with this intervention was based on the information collected from the patient. The Data and conclusions were drawn from the patient's questionnaire, the interview, and the physical examination. Because the information was provided in large part by the patient, it cannot be guaranteed that it has not been purposely or unconsciously manipulated. Every effort has been made to obtain  as much relevant data as possible for this evaluation. It is important to note that the conclusions that lead to this procedure are derived in large part from the available data. Always take into account that the treatment will also be dependent on availability of resources and existing treatment guidelines, considered by other Pain Management Practitioners as being common knowledge and practice, at the time of the intervention. For Medico-Legal purposes,  it is also important to point out that variation in procedural techniques and pharmacological choices are the acceptable norm. The indications, contraindications, technique, and results of the above procedure should only be interpreted and judged by a Board-Certified Interventional Pain Specialist with extensive familiarity and expertise in the same exact procedure and technique.

## 2017-03-26 NOTE — Patient Instructions (Signed)

## 2017-03-27 ENCOUNTER — Telehealth: Payer: Self-pay

## 2017-03-27 NOTE — Telephone Encounter (Signed)
Post procedure phone call.  States she is doing OK.  States she had some pain during the night.  Instructed her to put heat on it today and to call us if she has any questions or concerns.

## 2017-04-04 ENCOUNTER — Encounter: Payer: Self-pay | Admitting: Family Medicine

## 2017-04-04 ENCOUNTER — Ambulatory Visit (INDEPENDENT_AMBULATORY_CARE_PROVIDER_SITE_OTHER): Payer: Medicare HMO | Admitting: Family Medicine

## 2017-04-04 VITALS — BP 108/64 | HR 70 | Temp 98.3°F | Resp 16 | Ht 62.0 in | Wt 177.6 lb

## 2017-04-04 DIAGNOSIS — Z1231 Encounter for screening mammogram for malignant neoplasm of breast: Secondary | ICD-10-CM | POA: Diagnosis not present

## 2017-04-04 DIAGNOSIS — M48062 Spinal stenosis, lumbar region with neurogenic claudication: Secondary | ICD-10-CM

## 2017-04-04 DIAGNOSIS — F4310 Post-traumatic stress disorder, unspecified: Secondary | ICD-10-CM | POA: Diagnosis not present

## 2017-04-04 DIAGNOSIS — F3341 Major depressive disorder, recurrent, in partial remission: Secondary | ICD-10-CM | POA: Diagnosis not present

## 2017-04-04 DIAGNOSIS — Z1239 Encounter for other screening for malignant neoplasm of breast: Secondary | ICD-10-CM

## 2017-04-04 DIAGNOSIS — G894 Chronic pain syndrome: Secondary | ICD-10-CM | POA: Diagnosis not present

## 2017-04-04 DIAGNOSIS — R69 Illness, unspecified: Secondary | ICD-10-CM | POA: Diagnosis not present

## 2017-04-04 MED ORDER — GABAPENTIN 300 MG PO CAPS: 300.0000 mg | ORAL_CAPSULE | Freq: Three times a day (TID) | ORAL | 1 refills | Status: DC

## 2017-04-04 MED ORDER — SERTRALINE HCL 100 MG PO TABS: 100.0000 mg | ORAL_TABLET | Freq: Two times a day (BID) | ORAL | 1 refills | Status: DC

## 2017-04-04 MED ORDER — LORAZEPAM 0.5 MG PO TABS: 0.5000 mg | ORAL_TABLET | Freq: Two times a day (BID) | ORAL | 2 refills | Status: DC | PRN

## 2017-04-04 NOTE — Assessment & Plan Note (Signed)
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

## 2017-04-04 NOTE — Assessment & Plan Note (Signed)
Suspected etiology of current chronic pain S/p injection, limited relief See A&P pain

## 2017-04-04 NOTE — Assessment & Plan Note (Addendum)
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

## 2017-04-04 NOTE — Patient Instructions (Addendum)
Thank you for coming to the office today.  Refilled Lorazepam today - reduced dose, try to use only when needed, if need long term we can develop a contract for controlled substance  Refilled Gabapentin, Sertraline today as well  If you are ready for referral let me know your preference -    OB / GYN  [facility] [address], [place], [place] [address] Hours: [place]: [phone]  Internal Referral [facility]   Address: [address] [place], [place], [place], [place] [address] Hours: 8AM-5PM Phone: [phone]   Submit the records release form to [place] screening for breast cancer   Call the [place] below anytime to schedule your own appointment now that order has been placed.  [facility] [place], [place] [address] Phone: [phone]  [facility] [address] [place], [place] [address] Phone: [phone]   Please schedule a Follow-up Appointment to: Return in about 3 months (around 07/05/2017) for Anxiety/Depression PTD PHQ/GAD, Chronic Pain.  If you have any other questions or concerns, please feel free to call the office or send a message through [place]. You may also schedule an earlier appointment if necessary.  Additionally, you may be receiving a survey about your experience at our office within a few days to 1 week by e-mail or mail. We value your feedback.  Nobie Putnam, DO [place]

## 2017-04-04 NOTE — Progress Notes (Signed)
Subjective:    Patient ID: Ashley Fields, female    DOB: 08/01/1955, 62 y.o.   MRN: [id number]  Ashley Fields is a 62 y.o. female presenting on 04/04/2017 for Establish Care; Pain; and Post-Traumatic Stress Disorder  Patient recently established care with our office on 03/15/17, was seen for initial acute visit and treated for sinusitis.  HPI  FOLLOW-UP Sinusitis Last seen 03/2017, treated with Augmentin. Now returns with resolved symptoms.  Chronic Pain Syndrome / Lumbar Spinal Stenosis Followed by [facility], last seen 03/26/17 for procedure visit Lumbar spinal ESI injection L3-L4, but her pain is worse at L5-S1, when she has severe pain with severe spinal stenosis, affecting her nerves unable to move legs at times - Her next apt on 04/17/17 - for follow-up to discuss medications, previously in [place] she was taking Oxycodone 7.5/325 with some relief, she has been seeing him since August 2018, has been taking Tramadol with limited relief, and has severe pain still she is coping with - Was taking Gabapentin 300mg  TID, needs refill, was rx by prior PCP - Continued on Tramadol and Tizanidine per pain management - PMH retained surgical hardware in cervical and lumbar spine  History of PTSD / Anxiety - Reports chronic history of mental health issue with PTSD and anxiety, previously managed by prior PCP in [place] for years on Diazepam for PRN anxiety. She was transitioned to Lorazepam by previous PCP locally, and now is currently low on this rx. It is not rx by pain management - She had recent evaluated by [facility] eval for Pain Management consult - continued Zoloft, Trazodone, Lorazepam PRN. And they could see her back in future if needed  S/p Hysterectomy, Hormone Replacement Therapy Followed by [facility] locally, she is on Estradiol hormone therapy 1mg  daily, she is requesting referral to in network GYN, requesting contact info.  History of Anemia - Taking  iron supplement daily, has secondary constipation with iron and pain medicine, taking Dulcolax PRN  Additional history - Followed by Dr Ellin Mayhew locally, and found a tiny film on R eye, may require laser to help resolve this.   Health Maintenance:  Colon CA Screening: Last Colonoscopy (done by out of state GI Dr Wynetta Emery in [place], [place] [facility] [place]) results with reported 2 small polyps benign, good for 10 years - requesting copy of report, that doctor has retired. Currently asymptomatic. No known family history of colon CA. Also reported had cologuard completed in past.  Breast CA Screening: Due for mammogram screening. Last mammogram result negative >1-2 years, done out of state in [place], patient to submit records release for [facility]. Prior abnormal mammogram required [place] but then benign, no biopsy or other abnormality. No known family history of breast cancer. Currently asymptomatic. S/p breast implants.   Depression screen [facility] 2/9 03/26/2017 03/11/2017 02/11/2017  Decreased Interest 0 0 1  Down, Depressed, Hopeless 0 0 1  PHQ - 2 Score 0 0 2  Altered sleeping 0 0 3  Tired, decreased energy 0 0 3  Change in appetite 0 0 3  Feeling bad or failure about yourself  0 0 0  Trouble concentrating 0 0 1  Moving slowly or fidgety/restless 0 0 0  Suicidal thoughts 0 0 0  PHQ-9 Score 0 0 12  Difficult doing work/chores Not difficult at all Not difficult at all Somewhat difficult    Past Medical History:  Diagnosis Date  . Allergy   . Anemia   .  Chronic kidney disease   . Depression   . Disp fx of cuboid bone of right foot, init for clos fx 01/01/2017  . GERD (gastroesophageal reflux disease)   . Headache   . Hepatitis C   . Hyperlipidemia   . Osteoporosis   . Thyroid disease   . Urine incontinence    Past Surgical History:  Procedure Laterality Date  . 5 miscarriages     1977-1985, Blood transfusion s/p miscarriage 1977  . ABDOMINAL HYSTERECTOMY  1987   Total   . APPENDECTOMY  12/2008  . breast lift bilateral, implants  16/10/9602   bilateral, silicon naturel  . BREAST REDUCTION SURGERY Bilateral 1997  . CESAREAN SECTION  07/27/1981   Placenta Previa  . CHOLECYSTECTOMY  03/2004   Lap surgery  . GASTRIC BYPASS  2005   Laparoscopic  . IVC FILTER INSERTION  12/2003   TrapEase Vena Cava Filter  . LUMBAR LAMINECTOMY  06/2006   L4-L5 (spinal fusion)  . mini tummy tuck  05/07/2016   Bilateral bra/back roll lift skin removal  . neck surgery C3-C7  02/10/2015   ACDF  . SHOULDER ACROMIOPLASTY Left 03/27/2013   w/ labral debridement  . SPINAL CORD STIMULATOR IMPLANT  11/2010   removed 05/2011   Social History   Socioeconomic History  . Marital status: Widowed    Spouse name: Not on file  . Number of children: Not on file  . Years of education: [employer]  . Highest education level: Not on file  Occupational History  . Not on file  Social Needs  . Financial resource strain: Not on file  . Food insecurity:    Worry: Not on file    Inability: Not on file  . Transportation needs:    Medical: Not on file    Non-medical: Not on file  Tobacco Use  . Smoking status: Former Smoker    Years: 4.00  . Smokeless tobacco: Never Used  Substance and Sexual Activity  . Alcohol use: No  . Drug use: No  . Sexual activity: Not on file  Lifestyle  . Physical activity:    Days per week: Not on file    Minutes per session: Not on file  . Stress: Not on file  Relationships  . Social connections:    Talks on phone: Not on file    Gets together: Not on file    Attends religious service: Not on file    Active member of club or organization: Not on file    Attends meetings of clubs or organizations: Not on file    Relationship status: Not on file  . Intimate partner violence:    Fear of current or ex partner: Not on file    Emotionally abused: Not on file    Physically abused: Not on file    Forced sexual activity: Not on file  Other Topics  Concern  . Not on file  Social History Narrative  . Not on file   Family History  Problem Relation Age of Onset  . COPD Mother   . Lung cancer Mother   . Diabetes Mother   . Heart disease Mother   . Stroke Mother   . Heart disease Father   . Heart attack Father 21  . Depression Sister        x 5 sisters  . Heart disease Brother        x 3 brothers  . Diabetes Brother   . Colon polyps Sister  Current Outpatient Medications on File Prior to Visit  Medication Sig  . Biotin 10000 MCG TABS Take 10,000 mcg by mouth 1 day or 1 dose.  . butalbital-acetaminophen-caffeine (FIORICET, ESGIC) 50-325-40 MG tablet TAKE 1 TABLET BY MOUTH EVERY NIGHT AS NEEDED FOR HEADACHE  . estradiol (ESTRACE) 1 MG tablet Take 1 mg by mouth daily.  . ferrous sulfate 325 (65 FE) MG tablet Take 325 mg by mouth daily.  . fluticasone (FLONASE) 50 MCG/ACT nasal spray 1 SPRAY IN EACH NOSTRIL ONCE A DAY NASALLY 30 DAYS  . Melatonin 10 MG CAPS Take 1 capsule by mouth at bedtime.  . Multiple Vitamin (MULTIVITAMIN WITH MINERALS) TABS tablet Take 1 tablet by mouth daily.  Marland Kitchen omeprazole (PRILOSEC) 40 MG capsule Take 40 mg by mouth daily.  . ondansetron (ZOFRAN ODT) 4 MG disintegrating tablet Take 1 tablet (4 mg total) by mouth every 8 (eight) hours as needed for nausea or vomiting.  . simvastatin (ZOCOR) 20 MG tablet TAKE 1 TABLET IN THE EVENING ONCE A DAY ORALLY 90 DAYS  . tiZANidine (ZANAFLEX) 4 MG tablet Take 1 tablet (4 mg total) by mouth 3 (three) times daily.  . traMADol (ULTRAM) 50 MG tablet Take 1 tablet (50 mg total) by mouth every 6 (six) hours as needed for severe pain.  . traZODone (DESYREL) 150 MG tablet Take 150 mg by mouth at bedtime.  . Turmeric 500 MG CAPS Take 2 capsules by mouth daily.  . vitamin C (ASCORBIC ACID) 500 MG tablet Take 500 mg 2 (two) times daily by mouth.   No current facility-administered medications on file prior to visit.     Review of Systems  Constitutional: Negative for  activity change, appetite change, chills, diaphoresis, fatigue and fever.  HENT: Negative for congestion and hearing loss.   Eyes: Negative for visual disturbance.  Respiratory: Negative for cough, chest tightness, shortness of breath and wheezing.   Cardiovascular: Negative for chest pain, palpitations and leg swelling.  Gastrointestinal: Negative for abdominal pain, anal bleeding, blood in stool, constipation, diarrhea, nausea and vomiting.  Endocrine: Negative for cold intolerance.  Genitourinary: Negative for dysuria, frequency and hematuria.  Musculoskeletal: Positive for arthralgias and back pain. Negative for neck pain.  Skin: Negative for rash.  Allergic/Immunologic: Negative for environmental allergies.  Neurological: Negative for dizziness, weakness, light-headedness, numbness and headaches.  Hematological: Negative for adenopathy.  Psychiatric/Behavioral: Positive for dysphoric mood. Negative for agitation, behavioral problems, self-injury, sleep disturbance and suicidal ideas. The patient is nervous/anxious.    Per HPI unless specifically indicated above     Objective:    BP 108/64   Pulse 70   Temp 98.3 F (36.8 C) (Oral)   Resp 16   Ht 5\' 2"  (1.575 m)   Wt 177 lb 9.6 oz (80.6 kg)   BMI 32.48 kg/m   Wt Readings from Last 3 Encounters:  04/04/17 177 lb 9.6 oz (80.6 kg)  03/26/17 170 lb (77.1 kg)  03/15/17 181 lb 6.4 oz (82.3 kg)    Physical Exam  Constitutional: She is oriented to person, place, and time. She appears well-developed and well-nourished. No distress.  Chronically ill appearing 62 yr old female, slightly uncomfortable with general pain, cooperative  HENT:  Head: Normocephalic and atraumatic.  Mouth/Throat: Oropharynx is clear and moist.  Eyes: Conjunctivae are normal. Right eye exhibits no discharge. Left eye exhibits no discharge.  Neck: Normal range of motion. Neck supple. No thyromegaly present.  Cardiovascular: Normal rate, regular rhythm, normal  heart sounds and intact  distal pulses.  No murmur heard. Pulmonary/Chest: Effort normal and breath sounds normal. No respiratory distress. She has no wheezes. She has no rales.  Musculoskeletal: Normal range of motion. She exhibits no edema.  Low Back Inspection: Normal appearance, no spinal deformity, symmetrical. Palpation: Mild general tenderness over spinous processes and bilateral paraspinal muscles low lumbar, with assoc hypertonicity/spasm. ROM: Reduced active ROM forward flex / back extension, rotation L/R without discomfort Strength: Bilateral hip flex/ext 5/5, knee flex/ext 5/5, ankle dorsiflex/plantarflex 5/5 Neurovascular: reduced distal sensation to light touch  Uses a cane to assist with ambulation  Lymphadenopathy:    She has no cervical adenopathy.  Neurological: She is alert and oriented to person, place, and time.  Skin: Skin is warm and dry. No rash noted. She is not diaphoretic. No erythema.  Psychiatric: She has a normal mood and affect. Her behavior is normal.  Well groomed, good eye contact, normal speech and thoughts  Nursing note and vitals reviewed.  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, neuropathy Followed by [facility] Last seen injection ESI 3/19, limited results Continue Tramadol, Tizanidine, Gabapentin (refilled) Continues to follow-up with [facility] for further med adjust - may need alternative pain medicine - Future consider switch Zoloft to Cymbalta      Relevant Medications   gabapentin (NEURONTIN) 300 MG capsule   Lumbar spinal stenosis (severe) (L3-4) (Chronic)    Suspected etiology of current chronic pain S/p injection, limited relief See A&P pain        Relevant Medications   gabapentin (NEURONTIN) 300 MG capsule   PTSD (post-traumatic stress disorder)    Stable chronic history PTSD w/ anxiety On chronic BDZ, years on Diazepam now transitioned to Lorazepam using more PRN lower doses now Continue Zoloft Agree to refill Lorazepam, reduce dose to 0.5mg  BID, #45 with refills for 3 month supply, ultimately if can reduce dose explained this is ideal in setting of her pain management / current medications regarding safety - Future consider switch zoloft to cymbalta if need      Relevant Medications   LORazepam (ATIVAN) 0.5 MG tablet   sertraline (ZOLOFT) 100 MG tablet    Other Visit Diagnoses    Recurrent major depressive disorder, in partial remission (HCC)       Relevant Medications   LORazepam (ATIVAN) 0.5 MG tablet   sertraline (ZOLOFT) 100 MG tablet   Screening for breast cancer       Ordered mammo w/ implants at [facility] - given release form, prior mammo in [place]   Relevant Orders   MM DIGITAL SCREENING W/ IMPLANTS BILATERAL      Meds ordered this encounter  Medications  . gabapentin (NEURONTIN) 300 MG capsule    Sig: Take 1 capsule (300 mg total) by mouth 3 (three) times daily.    Dispense:  270 capsule    Refill:  1  . LORazepam (ATIVAN) 0.5 MG tablet    Sig: Take 1 tablet (0.5 mg total) by mouth 2 (two) times daily as needed for anxiety.    Dispense:  45 tablet    Refill:  2  . sertraline (ZOLOFT) 100 MG tablet  Sig: Take 1 tablet (100 mg total) by mouth 2 (two) times daily.    Dispense:  180 tablet    Refill:  1    Follow up plan: Return in about 3 months (around 07/05/2017) for Anxiety/Depression PTD PHQ/GAD, Chronic Pain.  Nobie Putnam, [facility] 04/04/2017, 3:50 PM

## 2017-04-17 ENCOUNTER — Ambulatory Visit: Payer: Medicare HMO | Attending: Pain Medicine | Admitting: Pain Medicine

## 2017-04-17 ENCOUNTER — Other Ambulatory Visit: Payer: Self-pay

## 2017-04-17 ENCOUNTER — Encounter: Payer: Self-pay | Admitting: Pain Medicine

## 2017-04-17 VITALS — BP 115/73 | HR 64 | Temp 98.2°F | Ht 62.0 in | Wt 170.0 lb

## 2017-04-17 DIAGNOSIS — Z87442 Personal history of urinary calculi: Secondary | ICD-10-CM | POA: Diagnosis not present

## 2017-04-17 DIAGNOSIS — M79604 Pain in right leg: Secondary | ICD-10-CM

## 2017-04-17 DIAGNOSIS — E785 Hyperlipidemia, unspecified: Secondary | ICD-10-CM | POA: Diagnosis not present

## 2017-04-17 DIAGNOSIS — Z8249 Family history of ischemic heart disease and other diseases of the circulatory system: Secondary | ICD-10-CM | POA: Insufficient documentation

## 2017-04-17 DIAGNOSIS — G8929 Other chronic pain: Secondary | ICD-10-CM | POA: Diagnosis not present

## 2017-04-17 DIAGNOSIS — M533 Sacrococcygeal disorders, not elsewhere classified: Secondary | ICD-10-CM | POA: Diagnosis not present

## 2017-04-17 DIAGNOSIS — Z9884 Bariatric surgery status: Secondary | ICD-10-CM | POA: Diagnosis not present

## 2017-04-17 DIAGNOSIS — Z833 Family history of diabetes mellitus: Secondary | ICD-10-CM | POA: Insufficient documentation

## 2017-04-17 DIAGNOSIS — Z888 Allergy status to other drugs, medicaments and biological substances status: Secondary | ICD-10-CM | POA: Insufficient documentation

## 2017-04-17 DIAGNOSIS — Z9049 Acquired absence of other specified parts of digestive tract: Secondary | ICD-10-CM | POA: Insufficient documentation

## 2017-04-17 DIAGNOSIS — Z823 Family history of stroke: Secondary | ICD-10-CM | POA: Insufficient documentation

## 2017-04-17 DIAGNOSIS — M79605 Pain in left leg: Secondary | ICD-10-CM

## 2017-04-17 DIAGNOSIS — M5441 Lumbago with sciatica, right side: Secondary | ICD-10-CM

## 2017-04-17 DIAGNOSIS — M542 Cervicalgia: Secondary | ICD-10-CM | POA: Diagnosis not present

## 2017-04-17 DIAGNOSIS — G894 Chronic pain syndrome: Secondary | ICD-10-CM | POA: Insufficient documentation

## 2017-04-17 DIAGNOSIS — D649 Anemia, unspecified: Secondary | ICD-10-CM | POA: Diagnosis not present

## 2017-04-17 DIAGNOSIS — Z9889 Other specified postprocedural states: Secondary | ICD-10-CM | POA: Insufficient documentation

## 2017-04-17 DIAGNOSIS — M5136 Other intervertebral disc degeneration, lumbar region: Secondary | ICD-10-CM | POA: Insufficient documentation

## 2017-04-17 DIAGNOSIS — Z881 Allergy status to other antibiotic agents status: Secondary | ICD-10-CM | POA: Insufficient documentation

## 2017-04-17 DIAGNOSIS — Z79899 Other long term (current) drug therapy: Secondary | ICD-10-CM | POA: Insufficient documentation

## 2017-04-17 DIAGNOSIS — R32 Unspecified urinary incontinence: Secondary | ICD-10-CM | POA: Insufficient documentation

## 2017-04-17 DIAGNOSIS — Z818 Family history of other mental and behavioral disorders: Secondary | ICD-10-CM | POA: Insufficient documentation

## 2017-04-17 DIAGNOSIS — Z981 Arthrodesis status: Secondary | ICD-10-CM | POA: Insufficient documentation

## 2017-04-17 DIAGNOSIS — M961 Postlaminectomy syndrome, not elsewhere classified: Secondary | ICD-10-CM

## 2017-04-17 DIAGNOSIS — M25512 Pain in left shoulder: Secondary | ICD-10-CM | POA: Insufficient documentation

## 2017-04-17 DIAGNOSIS — F329 Major depressive disorder, single episode, unspecified: Secondary | ICD-10-CM | POA: Diagnosis not present

## 2017-04-17 DIAGNOSIS — M1610 Unilateral primary osteoarthritis, unspecified hip: Secondary | ICD-10-CM | POA: Diagnosis not present

## 2017-04-17 DIAGNOSIS — M5442 Lumbago with sciatica, left side: Secondary | ICD-10-CM | POA: Diagnosis not present

## 2017-04-17 DIAGNOSIS — B1920 Unspecified viral hepatitis C without hepatic coma: Secondary | ICD-10-CM | POA: Diagnosis not present

## 2017-04-17 DIAGNOSIS — K219 Gastro-esophageal reflux disease without esophagitis: Secondary | ICD-10-CM | POA: Diagnosis not present

## 2017-04-17 DIAGNOSIS — Z825 Family history of asthma and other chronic lower respiratory diseases: Secondary | ICD-10-CM | POA: Insufficient documentation

## 2017-04-17 DIAGNOSIS — M25562 Pain in left knee: Secondary | ICD-10-CM | POA: Insufficient documentation

## 2017-04-17 DIAGNOSIS — Z8371 Family history of colonic polyps: Secondary | ICD-10-CM | POA: Insufficient documentation

## 2017-04-17 DIAGNOSIS — Z87891 Personal history of nicotine dependence: Secondary | ICD-10-CM | POA: Diagnosis not present

## 2017-04-17 DIAGNOSIS — M25559 Pain in unspecified hip: Secondary | ICD-10-CM

## 2017-04-17 DIAGNOSIS — M48061 Spinal stenosis, lumbar region without neurogenic claudication: Secondary | ICD-10-CM | POA: Insufficient documentation

## 2017-04-17 DIAGNOSIS — Z90710 Acquired absence of both cervix and uterus: Secondary | ICD-10-CM | POA: Insufficient documentation

## 2017-04-17 MED ORDER — MAGNESIUM 500 MG PO CAPS: 500.0000 mg | ORAL_CAPSULE | Freq: Two times a day (BID) | ORAL | 5 refills | Status: DC

## 2017-04-17 NOTE — Progress Notes (Addendum)
Patient's Name: Ashley Fields  MRN: [id number]  Referring Provider: Nobie Putnam *  DOB: 07/26/1955  PCP: Olin Hauser, DO  DOS: 04/17/2017  Note by: Gaspar Cola, MD  Service setting: Ambulatory outpatient  Specialty: Interventional Pain Management  Location: [facility] (AMB) Pain Management Facility    Patient type: Established   Primary Reason(s) for Visit: Encounter for post-procedure evaluation of chronic illness with mild to moderate exacerbation CC: Back Pain (lower)  HPI  Ashley Fields is a 62 y.o. year old, female patient, who comes today for a post-procedure evaluation. She has Chronic pain syndrome; Chronic neck pain ; Chronic hip pain (Secondary Area of Pain) (Bilateral) (L>R); Long term current use of opiate analgesic; Long term prescription opiate use; Opiate use; Chronic knee pain (Left); Osteoarthritis of hip (Bilateral); Chronic Low back pain (Primary Area of Pain) (Bilateral)  (R>L); Chronic lower extremity pain (Tertiary Area of Pain) (Bilateral) (L>R); Long term prescription benzodiazepine use; Grade 1 Anterolisthesis of L3 over L4; Failed back surgical syndrome; Chronic shoulder pain (Left); History of cervical fusion (ACDF C4-C7); DDD (degenerative disc disease), cervical; Chronic sacroiliac joint pain (Bilateral) (R>L); Lumbar Facet Syndrome (Bilateral) (R>L); Lumbar facet osteoarthritis (Bilateral); DDD (degenerative disc disease), lumbar; History of hepatitis C; Hepatic steatosis; History of nephrolithiasis; Lumbar spinal stenosis (severe) (L3-4); Lumbar lateral recess stenosis (Bilateral) (L3-4); Lumbar foraminal stenosis (L3-4) (Left); PTSD (post-traumatic stress disorder); GERD (gastroesophageal reflux disease); and Coccygodynia on their problem list. Her primarily concern today is the Back Pain (lower)  Pain Assessment: Location: Lower Back Radiating: left hip and thigh Onset: More than a month ago Duration:   Quality: Aching, Throbbing, Jabbing,  Nagging, Discomfort, Constant Severity: 3 /10 (self-reported pain score)  Note: Reported level is compatible with observation.                         When using our objective Pain Scale, levels between 6 and 10/10 are said to belong in an emergency room, as it progressively worsens from a 6/10, described as severely limiting, requiring emergency care not usually available at an outpatient pain management facility. At a 6/10 level, communication becomes difficult and requires great effort. Assistance to reach the emergency department may be required. Facial flushing and profuse sweating along with potentially dangerous increases in heart rate and blood pressure will be evident. Effect on ADL: harding bending Timing: Constant Modifying factors: nothing  Ashley Fields comes in today for post-procedure evaluation after the treatment done on 03/26/2017.  Further details on both, my assessment(s), as well as the proposed treatment plan, please see below.  Post-Procedure Assessment  03/26/2017 Procedure: Diagnostic bilateral L3-4 transforaminal LESI #1 under fluoroscopic guidance and IV sedation Pre-procedure pain score:  4/10 Post-procedure pain score: 1/10 (> 50% relief) Influential Factors: BMI: 31.09 kg/m Intra-procedural challenges: None observed.         Assessment challenges: None detected.              Reported side-effects: None.        Post-procedural adverse reactions or complications: None reported         Sedation: Sedation provided. When no sedatives are used, the analgesic levels obtained are directly associated to the effectiveness of the local anesthetics. However, when sedation is provided, the level of analgesia obtained during the initial 1 hour following the intervention, is believed to be the result of a combination of factors. These factors may include, but are not limited to: 1. The effectiveness of the  local anesthetics used. 2. The effects of the analgesic(s) and/or  anxiolytic(s) used. 3. The degree of discomfort experienced by the patient at the time of the procedure. 4. The patients ability and reliability in recalling and recording the events. 5. The presence and influence of possible secondary gains and/or psychosocial factors. Reported result: Relief experienced during the 1st hour after the procedure: 80 % (Ultra-Short Term Relief)            Interpretative annotation: Clinically appropriate result. Analgesia during this period is likely to be Local Anesthetic and/or IV Sedative (Analgesic/Anxiolytic) related.          Effects of local anesthetic: The analgesic effects attained during this period are directly associated to the localized infiltration of local anesthetics and therefore cary significant diagnostic value as to the etiological location, or anatomical origin, of the pain. Expected duration of relief is directly dependent on the pharmacodynamics of the local anesthetic used. Long-acting (4-6 hours) anesthetics used.  Reported result: Relief during the next 4 to 6 hour after the procedure: 40 % (Short-Term Relief)            Interpretative annotation: Clinically appropriate result. Analgesia during this period is likely to be Local Anesthetic-related.          Long-term benefit: Defined as the period of time past the expected duration of local anesthetics (1 hour for short-acting and 4-6 hours for long-acting). With the possible exception of prolonged sympathetic blockade from the local anesthetics, benefits during this period are typically attributed to, or associated with, other factors such as analgesic sensory neuropraxia, antiinflammatory effects, or beneficial biochemical changes provided by agents other than the local anesthetics.  Reported result: Extended relief following procedure: 85 % (Long-Term Relief)            Interpretative annotation: Clinically appropriate result. Good relief. No permanent benefit expected. Inflammation plays a  part in the etiology to the pain.          Current benefits: Defined as reported results that persistent at this point in time.   Analgesia: 90 % Ashley Fields reports improvement of axial symptoms. Function: Somewhat improved ROM: Somewhat improved Interpretative annotation: Recurrence of symptoms. No permanent benefit expected. Effective diagnostic intervention.          Interpretation: Results would suggest a successful diagnostic intervention.                  Plan:  Please see "Plan of Care" for details.                Laboratory Chemistry  Inflammation Markers (CRP: Acute Phase) (ESR: Chronic Phase) Lab Results  Component Value Date   CRP 0.7 08/14/2016   ESRSEDRATE 9 08/14/2016                         Renal Function Markers Lab Results  Component Value Date   BUN 17 08/14/2016   CREATININE 0.80 08/14/2016   GFRAA 93 08/14/2016   GFRNONAA 80 08/14/2016                              Hepatic Function Markers Lab Results  Component Value Date   AST 20 08/14/2016   ALT 10 08/14/2016   ALBUMIN 4.9 (H) 08/14/2016   ALKPHOS 103 08/14/2016  Electrolytes Lab Results  Component Value Date   NA 139 08/14/2016   K 4.1 08/14/2016   CL 97 08/14/2016   CALCIUM 9.6 08/14/2016   MG 2.3 08/14/2016                        Neuropathy Markers Lab Results  Component Value Date   VITAMINB12 473 08/14/2016                        Bone Pathology Markers Lab Results  Component Value Date   25OHVITD1 47 08/14/2016   25OHVITD2 <1.0 08/14/2016   25OHVITD3 47 08/14/2016                         Note: Lab results reviewed.  Recent Diagnostic Imaging Results  DG C-Arm 1-60 Min-No Report Fluoroscopy was utilized by the requesting physician.  No radiographic  interpretation.   Complexity Note: I personally reviewed the fluoroscopic imaging of the procedure.                        Meds   Current Outpatient Medications:  .  Biotin 10000 MCG TABS, Take  10,000 mcg by mouth 1 day or 1 dose., Disp: , Rfl:  .  butalbital-acetaminophen-caffeine (FIORICET, ESGIC) 50-325-40 MG tablet, TAKE 1 TABLET BY MOUTH EVERY NIGHT AS NEEDED FOR HEADACHE, Disp: , Rfl: 2 .  cholecalciferol (VITAMIN D) 1000 units tablet, Take 1,000 Units by mouth daily., Disp: , Rfl:  .  estradiol (ESTRACE) 1 MG tablet, Take 1 mg by mouth daily., Disp: , Rfl:  .  ferrous sulfate 325 (65 FE) MG tablet, Take 325 mg by mouth daily., Disp: , Rfl:  .  fluticasone (FLONASE) 50 MCG/ACT nasal spray, 1 SPRAY IN EACH NOSTRIL ONCE A DAY NASALLY 30 DAYS, Disp: , Rfl: 3 .  gabapentin (NEURONTIN) 300 MG capsule, Take 1 capsule (300 mg total) by mouth 3 (three) times daily., Disp: 270 capsule, Rfl: 1 .  LORazepam (ATIVAN) 0.5 MG tablet, Take 1 tablet (0.5 mg total) by mouth 2 (two) times daily as needed for anxiety., Disp: 45 tablet, Rfl: 2 .  Melatonin 10 MG CAPS, Take 1 capsule by mouth at bedtime., Disp: , Rfl:  .  Multiple Vitamin (MULTIVITAMIN WITH MINERALS) TABS tablet, Take 1 tablet by mouth daily., Disp: , Rfl:  .  omeprazole (PRILOSEC) 40 MG capsule, Take 40 mg by mouth daily., Disp: , Rfl: 0 .  ondansetron (ZOFRAN ODT) 4 MG disintegrating tablet, Take 1 tablet (4 mg total) by mouth every 8 (eight) hours as needed for nausea or vomiting., Disp: 30 tablet, Rfl: 0 .  sertraline (ZOLOFT) 100 MG tablet, Take 1 tablet (100 mg total) by mouth 2 (two) times daily., Disp: 180 tablet, Rfl: 1 .  simvastatin (ZOCOR) 20 MG tablet, TAKE 1 TABLET IN THE EVENING ONCE A DAY ORALLY 90 DAYS, Disp: , Rfl: 0 .  tiZANidine (ZANAFLEX) 4 MG tablet, Take 1 tablet (4 mg total) by mouth 3 (three) times daily., Disp: 90 tablet, Rfl: 1 .  traMADol (ULTRAM) 50 MG tablet, Take 1 tablet (50 mg total) by mouth every 6 (six) hours as needed for severe pain., Disp: 120 tablet, Rfl: 1 .  traZODone (DESYREL) 150 MG tablet, Take 150 mg by mouth at bedtime., Disp: , Rfl: 0 .  Turmeric 500 MG CAPS, Take 2 capsules by mouth  daily., Disp: ,  Rfl:  .  vitamin B-12 (CYANOCOBALAMIN) 500 MCG tablet, Take 500 mcg by mouth daily., Disp: , Rfl:  .  vitamin C (ASCORBIC ACID) 500 MG tablet, Take 500 mg 2 (two) times daily by mouth., Disp: , Rfl:  .  vitamin E 100 UNIT capsule, Take by mouth daily., Disp: , Rfl:  .  Magnesium 500 MG CAPS, Take 1 capsule (500 mg total) by mouth 2 (two) times daily at 8 am and 10 pm., Disp: 60 capsule, Rfl: 5  ROS  Constitutional: Denies any fever or chills Gastrointestinal: No reported hemesis, hematochezia, vomiting, or acute GI distress Musculoskeletal: Denies any acute onset joint swelling, redness, loss of ROM, or weakness Neurological: No reported episodes of acute onset apraxia, aphasia, dysarthria, agnosia, amnesia, paralysis, loss of coordination, or loss of consciousness  Allergies  Ashley Fields is allergic to flagyl [metronidazole] and tape.  PFSH  Drug: Ashley Fields  reports that she does not use drugs. Alcohol:  reports that she does not drink alcohol. Tobacco:  reports that she has quit smoking. She quit after 4.00 years of use. She has never used smokeless tobacco. Medical:  has a past medical history of Allergy, Anemia, Chronic kidney disease, Depression, Disp fx of cuboid bone of right foot, init for clos fx (01/01/2017), GERD (gastroesophageal reflux disease), Headache, Hepatitis C, Hyperlipidemia, Osteoporosis, Thyroid disease, and Urine incontinence. Surgical: Ashley Fields  has a past surgical history that includes Cesarean section (07/27/1981); Abdominal hysterectomy (1987); Breast reduction surgery (Bilateral, 1997); Cholecystectomy (03/2004); Appendectomy (12/2008); Gastric bypass (2005); mini tummy tuck (05/07/2016); breast lift bilateral, implants (05/18/2015); Lumbar laminectomy (06/2006); 5 miscarriages; neck surgery C3-C7 (02/10/2015); IVC FILTER INSERTION (12/2003); Spinal cord stimulator implant (11/2010); and Shoulder acromioplasty (Left, 03/27/2013). Family: family  history includes COPD in her mother; Colon polyps in her sister; Depression in her sister; Diabetes in her brother and mother; Heart attack (age of onset: 30) in her father; Heart disease in her brother, father, and mother; Lung cancer in her mother; Stroke in her mother.  Constitutional Exam  General appearance: Well nourished, well developed, and well hydrated. In no apparent acute distress Vitals:   04/17/17 0924  BP: 115/73  Pulse: 64  Temp: 98.2 F (36.8 C)  SpO2: 98%  Weight: 170 lb (77.1 kg)  Height: _0  (1.575 m)   BMI Assessment: Estimated body mass index is 31.09 kg/m as calculated from the following:   Height as of this encounter: _1  (1.575 m).   Weight as of this encounter: 170 lb (77.1 kg).  BMI interpretation table: BMI level Category Range association with higher incidence of chronic pain  <18 kg/m2 Underweight   18.5-24.9 kg/m2 Ideal body weight   25-29.9 kg/m2 Overweight Increased incidence by 20%  30-34.9 kg/m2 Obese (Class I) Increased incidence by 68%  35-39.9 kg/m2 Severe obesity (Class II) Increased incidence by 136%  >40 kg/m2 Extreme obesity (Class III) Increased incidence by 254%   BMI Readings from Last 4 Encounters:  04/17/17 31.09 kg/m  04/04/17 32.48 kg/m  03/26/17 31.09 kg/m  03/15/17 33.18 kg/m   Wt Readings from Last 4 Encounters:  04/17/17 170 lb (77.1 kg)  04/04/17 177 lb 9.6 oz (80.6 kg)  03/26/17 170 lb (77.1 kg)  03/15/17 181 lb 6.4 oz (82.3 kg)  Psych/Mental status: Alert, oriented x 3 (person, place, & time)       Eyes: PERLA Respiratory: No evidence of acute respiratory distress  Cervical Spine Area Exam  Skin & Axial Inspection: No masses, redness,  edema, swelling, or associated skin lesions Alignment: Symmetrical Functional ROM: Unrestricted ROM      Stability: No instability detected Muscle Tone/Strength: Functionally intact. No obvious neuro-muscular anomalies detected. Sensory (Neurological): Unimpaired Palpation:  No palpable anomalies              Upper Extremity (UE) Exam    Side: Right upper extremity  Side: Left upper extremity  Skin & Extremity Inspection: Skin color, temperature, and hair growth are WNL. No peripheral edema or cyanosis. No masses, redness, swelling, asymmetry, or associated skin lesions. No contractures.  Skin & Extremity Inspection: Skin color, temperature, and hair growth are WNL. No peripheral edema or cyanosis. No masses, redness, swelling, asymmetry, or associated skin lesions. No contractures.  Functional ROM: Unrestricted ROM          Functional ROM: Unrestricted ROM          Muscle Tone/Strength: Functionally intact. No obvious neuro-muscular anomalies detected.  Muscle Tone/Strength: Functionally intact. No obvious neuro-muscular anomalies detected.  Sensory (Neurological): Unimpaired          Sensory (Neurological): Unimpaired          Palpation: No palpable anomalies              Palpation: No palpable anomalies              Specialized Test(s): Deferred         Specialized Test(s): Deferred          Thoracic Spine Area Exam  Skin & Axial Inspection: No masses, redness, or swelling Alignment: Symmetrical Functional ROM: Unrestricted ROM Stability: No instability detected Muscle Tone/Strength: Functionally intact. No obvious neuro-muscular anomalies detected. Sensory (Neurological): Unimpaired Muscle strength & Tone: No palpable anomalies  Lumbar Spine Area Exam  Skin & Axial Inspection: No masses, redness, or swelling Alignment: Symmetrical Functional ROM: Unrestricted ROM      Stability: No instability detected Muscle Tone/Strength: Functionally intact. No obvious neuro-muscular anomalies detected. Sensory (Neurological): Unimpaired Palpation: No palpable anomalies       Provocative Tests: Lumbar Hyperextension and rotation test: evaluation deferred today       Lumbar Lateral bending test: evaluation deferred today       Patrick's Maneuver: Positive for  bilateral S-I arthralgia and for bilateral hip arthralgia  Gait & Posture Assessment  Ambulation: Patient ambulates using a cane Gait: Very limited, using assistive device to ambulate Posture: Difficulty standing up straight, due to pain   Lower Extremity Exam    Side: Right lower extremity  Side: Left lower extremity  Skin & Extremity Inspection: Skin color, temperature, and hair growth are WNL. No peripheral edema or cyanosis. No masses, redness, swelling, asymmetry, or associated skin lesions. No contractures.  Skin & Extremity Inspection: Skin color, temperature, and hair growth are WNL. No peripheral edema or cyanosis. No masses, redness, swelling, asymmetry, or associated skin lesions. No contractures.  Functional ROM: Unrestricted ROM          Functional ROM: Unrestricted ROM          Muscle Tone/Strength: Functionally intact. No obvious neuro-muscular anomalies detected.  Muscle Tone/Strength: Functionally intact. No obvious neuro-muscular anomalies detected.  Sensory (Neurological): Unimpaired  Sensory (Neurological): Unimpaired  Palpation: No palpable anomalies  Palpation: No palpable anomalies   Assessment  Primary Diagnosis & Pertinent Problem List: The primary encounter diagnosis was Chronic Low back pain (Primary Area of Pain) (Bilateral)  (R>L). Diagnoses of Chronic hip pain (Secondary Area of Pain) (Bilateral) (L>R), Chronic lower extremity pain [facility] Area  of Pain) (Bilateral) (L>R), Gastroesophageal reflux disease without esophagitis, Coccygodynia, and Failed back surgical syndrome were also pertinent to this visit.  Status Diagnosis  Controlled Resolved Resolved 1. Chronic Low back pain (Primary Area of Pain) (Bilateral)  (R>L)   2. Chronic hip pain (Secondary Area of Pain) (Bilateral) (L>R)   3. Chronic lower extremity pain (Tertiary Area of Pain) (Bilateral) (L>R)   4. Gastroesophageal reflux disease without esophagitis   5. Coccygodynia   6. Failed back surgical  syndrome     Problems updated and reviewed during this visit: No problems updated. Plan of Care  Pharmacotherapy (Medications Ordered): Meds ordered this encounter  Medications  . Magnesium 500 MG CAPS    Sig: Take 1 capsule (500 mg total) by mouth 2 (two) times daily at 8 am and 10 pm.    Dispense:  60 capsule    Refill:  5    Do not place medication on "Automatic Refill".  The patient may use similar over-the-counter product.   Medications administered today: Wisdom Seybold had no medications administered during this visit.   Procedure Orders     Caudal Epidural Injection Lab Orders  No laboratory test(s) ordered today   Imaging Orders  No imaging studies ordered today   Referral Orders  No referral(s) requested today    Interventional management options: Planned, scheduled, and/or pending:   Diagnostic midline caudal epidural steroid injection #1 under fluoroscopic guidance and IV sedation   Considering:   Diagnosticbilateral L3-4 transforaminal LESI  Diagnostic bilateral lumbar facet block  Possiblebilateral lumbar facetRFA   Diagnostic bilateral sacroiliac joint block  Possible bilateral sacroiliac joint RFA  Diagnosticleft hip injection  Diagnosticleft femoral nerve + obturator nerve block   Possible left femoral nerve + obturator nerve RFA  Diagnostic left knee intra-articular steroid injection  Possible series of 5 left intra-articular Hyalgan knee injection  Diagnosticleft genicular nerve block  Possible left genicular nerve RFA  Diagnosticcervical epidural steroid injection  Diagnosticbilateral cervical facet block  Possiblebilateral cervical RFA    Palliative PRN treatment(s):   None at this time   Provider-requested follow-up: Return for Procedure (w/ sedation): (ML) Caudal ESI #1.  Future Appointments  Date Time Provider [place]  04/25/2017  9:15 AM Milinda Pointer, MD [facility] None  05/06/2017  2:00 PM [facility]   05/09/2017  9:00 AM Vevelyn Francois, NP [facility] None  07/05/2017 11:00 AM Parks Ranger, Devonne Doughty, DO [facility] None   Primary Care Physician: Olin Hauser, DO Location: [facility] Note by: Gaspar Cola, MD Date: 04/17/2017; Time: 10:07 AM

## 2017-04-17 NOTE — Patient Instructions (Addendum)
____________________________________________________________________________________________  Gabapentin Titration  Medication used: Gabapentin (Generic Name) or Neurontin (Brand Name) 300 mg tablets/capsules  Reasons to stop increasing the dose:  Reason 1: You get good relief of symptoms, in which case there is no need to increase the daily dose any further.    Reason 2: You develop some side effects, such as sleeping all of the time, difficulty concentrating, or becoming disoriented, in which case you need to go down on the dose, to the prior level, where you were not experiencing any side effects. Stay on that dose longer, to allow more time for your body to get use it, before attempting to increase it again.   Reasons to stop increasing the dose: Reason 1: You get good relief of symptoms, in which case there is no need to increase the daily dose any further.  Reason 2: You develop some side effects, such as sleeping all of the time, difficulty concentrating, or becoming disoriented, in which case you need to go down on the dose, to the prior level, where you were not experiencing any side effects. Stay on that dose longer, to allow more time for your body to get use it, before attempting to increase it again.  Steps to increase medication: Step 1: Start by taking 1 (one) tablet at bedtime x 7 (seven) days.  Step 2: After 7 (seven) days of taking 1 (one) tablet at bedtime, increase it to 2 (two) tablets at bedtime. Stay on this dose x 7 (seven) days.  Step 3: After 7 (seven) days of taking 2 (two) tablets at bedtime, increase it to 3 (three) tablets at bedtime. Stay on this dose x another 7 (seven) days.  Step 4: After 7 (seven) days of taking 3 (three) tablet at bedtime, begin taking 1 (one) tablet at noon with lunch. Stay on this dose x another 7 (seven) days.  Step 5: After 7 (seven) days of taking 3 (three) tablet at bedtime, and 1 (one) tablet at noon, then begin taking 1 (one)  tablet in the afternoon with dinner. Stay on this dose x another 7 (seven) days.  Step 6: After 7 (seven) days of taking 3 (three) tablet at bedtime, 1 (one) tablet at noon, and 1 (one) tablet in the afternoon, then begin taking 1 (one) tablet in the morning with breakfast. Stay on this dose x another 7 (seven) days. At this point you should be taking the medicine 4 (four) times a day, or about every 6 (six) hours. This daily regimen of taking the medicine 4 (four) times a day, will be maintained from now on. You should not take any doses any sooner than every 6 (six) hours.  Step 7: After 7 (seven) days of taking 3 (three) tablet at bedtime, 1 (one) tablet at noon, 1 (one) tablet in the afternoon, and 1 (one) tablet in the morning, begin taking 2 (two) tablets at noon with lunch. Stay on this dose x another 7 (seven) days.   Step 8: After 7 (seven) days of taking 3 (three) tablet at bedtime, 2 (two) tablets at noon, 1 (one) tablet in the afternoon, and 1 (one) tablet in the morning, begin taking 2 (two) tablets in the afternoon with dinner. Stay on this dose x another 7 (seven) days.   Step 9: After 7 (seven) days of taking 3 (three) tablet at bedtime, 2 (two) tablets at noon, 2 (two) tablets in the afternoon, and 1 (one) tablet in the morning, begin taking 2 (two) tablets in  the morning with breakfast. Stay on this dose x another 7 (seven) days. At this point you should be taking the medicine 4 (four) times a day, or about every 6 (six) hours. This daily regimen of taking the medicine 4 (four) times a day, will be maintained from now on. You should not take any doses any sooner than every 6 (six) hours.  Step 10: After 7 (seven) days of taking 3 (three) tablet at bedtime, 2 (two) tablets at noon, 2 (two) tablets in the afternoon, and 2 (two) tablets in the morning, begin taking 3 (three) tablets at noon with lunch. Stay on this dose x another 7 (seven) days.   Step 11: After 7 (seven) days of taking 3  (three) tablet at bedtime, 3 (three) tablets at noon, 2 (two) tablets in the afternoon, and 2 (two) tablets in the morning, begin taking 3 (three) tablets in the afternoon with dinner. Stay on this dose x another 7 (seven) days.   Step 12: After 7 (seven) days of taking 3 (three) tablet at bedtime, 3 (three) tablets at noon, 3 (three) tablets in the afternoon, and 2 (two) tablet in the morning, begin taking 3 (three) tablets in the morning with breakfast. Stay on this dose x another 7 (seven) days. At this point you should be taking the medicine 4 (four) times a day, or about every 6 (six) hours. This daily regimen of taking the medicine 4 (four) times a day, will be maintained from now on.   Endpoint: Once you have reached the maximum dose you can tolerate without side-effects, contact your physician so as to evaluate the results of the regimen.   Questions: Feel free to contact us for any questions or problems at [phone] ____________________________________________________________________________________________  ____________________________________________________________________________________________  Preparing for Procedure with Sedation  Instructions: . Oral Intake: Do not eat or drink anything for at least 8 hours prior to your procedure. . Transportation: Public transportation is not allowed. Bring an adult driver. The driver must be physically present in our waiting room before any procedure can be started. [facility] Physical Assistance: Bring an adult physically capable of assisting you, in the event you need help. This adult should keep you company at home for at least 6 hours after the procedure. . Blood Pressure Medicine: Take your blood pressure medicine with a sip of water the morning of the procedure. . Blood thinners:  . Diabetics on insulin: Notify the staff so that you can be scheduled 1st case in the morning. If your diabetes requires high dose insulin, take only  of your  normal insulin dose the morning of the procedure and notify the staff that you have done so. . Preventing infections: Shower with an antibacterial soap the morning of your procedure. . Build-up your immune system: Take 1000 mg of Vitamin C with every meal (3 times a day) the day prior to your procedure. [facility] Kitchen Antibiotics: Inform the staff if you have a condition or reason that requires you to take antibiotics before dental procedures. . Pregnancy: If you are pregnant, call and cancel the procedure. . Sickness: If you have a cold, fever, or any active infections, call and cancel the procedure. . Arrival: You must be in the facility at least 30 minutes prior to your scheduled procedure. . Children: Do not bring children with you. . Dress appropriately: Bring dark clothing that you would not mind if they get stained. . Valuables: Do not bring any jewelry or valuables.  Procedure appointments are reserved  for interventional treatments only. [facility] Kitchen No Prescription Refills. . No medication changes will be discussed during procedure appointments. . No disability issues will be discussed.  Remember:  Regular Business hours are:  Monday to Thursday 8:00 AM to 4:00 PM  Provider's Schedule: Milinda Pointer, MD:  Procedure days: Tuesday and Thursday 7:30 AM to 4:00 PM  Gillis Santa, MD:  Procedure days: Monday and Wednesday 7:30 AM to 4:00 PM ____________________________________________________________________________________________   ____________________________________________________________________________________________  Pain Scale  Introduction: The pain score used by this practice is the Verbal Numerical Rating Scale (VNRS-11). This is an 11-point scale. It is for adults and children 10 years or older. There are significant differences in how the pain score is reported, used, and applied. Forget everything you learned in the past and learn this scoring system.  General Information: The scale  should reflect your current level of pain. Unless you are specifically asked for the level of your worst pain, or your average pain. If you are asked for one of these two, then it should be understood that it is over the past 24 hours.  Basic Activities of Daily Living (ADL): Personal hygiene, dressing, eating, transferring, and using restroom.  Instructions: Most patients tend to report their level of pain as a combination of two factors, their physical pain and their psychosocial pain. This last one is also known as "suffering" and it is reflection of how physical pain affects you socially and psychologically. From now on, report them separately. From this point on, when asked to report your pain level, report only your physical pain. Use the following table for reference.  Pain Clinic Pain Levels (0-5/10)  Pain Level Score  Description  No Pain 0   Mild pain 1 Nagging, annoying, but does not interfere with basic activities of daily living (ADL). Patients are able to eat, bathe, get dressed, toileting (being able to get on and off the toilet and perform personal hygiene functions), transfer (move in and out of bed or a chair without assistance), and maintain continence (able to control bladder and bowel functions). Blood pressure and heart rate are unaffected. A normal heart rate for a healthy adult ranges from 60 to 100 bpm (beats per minute).   Mild to moderate pain 2 Noticeable and distracting. Impossible to hide from other people. More frequent flare-ups. Still possible to adapt and function close to normal. It can be very annoying and may have occasional stronger flare-ups. With discipline, patients may get used to it and adapt.   Moderate pain 3 Interferes significantly with activities of daily living (ADL). It becomes difficult to feed, bathe, get dressed, get on and off the toilet or to perform personal hygiene functions. Difficult to get in and out of bed or a chair without assistance. Very  distracting. With effort, it can be ignored when deeply involved in activities.   Moderately severe pain 4 Impossible to ignore for more than a few minutes. With effort, patients may still be able to manage work or participate in some social activities. Very difficult to concentrate. Signs of autonomic nervous system discharge are evident: dilated pupils (mydriasis); mild sweating (diaphoresis); sleep interference. Heart rate becomes elevated (>115 bpm). Diastolic blood pressure (lower number) rises above 100 mmHg. Patients find relief in laying down and not moving.   Severe pain 5 Intense and extremely unpleasant. Associated with frowning face and frequent crying. Pain overwhelms the senses.  Ability to do any activity or maintain social relationships becomes significantly limited. Conversation becomes difficult. Pacing  back and forth is common, as getting into a comfortable position is nearly impossible. Pain wakes you up from deep sleep. Physical signs will be obvious: pupillary dilation; increased sweating; goosebumps; brisk reflexes; cold, clammy hands and feet; nausea, vomiting or dry heaves; loss of appetite; significant sleep disturbance with inability to fall asleep or to remain asleep. When persistent, significant weight loss is observed due to the complete loss of appetite and sleep deprivation.  Blood pressure and heart rate becomes significantly elevated. Caution: If elevated blood pressure triggers a pounding headache associated with blurred vision, then the patient should immediately seek attention at an urgent or emergency care unit, as these may be signs of an impending stroke.    Emergency Department Pain Levels (6-10/10)  Emergency Room Pain 6 Severely limiting. Requires emergency care and should not be seen or managed at an outpatient pain management facility. Communication becomes difficult and requires great effort. Assistance to reach the emergency department may be required. Facial  flushing and profuse sweating along with potentially dangerous increases in heart rate and blood pressure will be evident.   Distressing pain 7 Self-care is very difficult. Assistance is required to transport, or use restroom. Assistance to reach the emergency department will be required. Tasks requiring coordination, such as bathing and getting dressed become very difficult.   Disabling pain 8 Self-care is no longer possible. At this level, pain is disabling. The individual is unable to do even the most "basic" activities such as walking, eating, bathing, dressing, transferring to a bed, or toileting. Fine motor skills are lost. It is difficult to think clearly.   Incapacitating pain 9 Pain becomes incapacitating. Thought processing is no longer possible. Difficult to remember your own name. Control of movement and coordination are lost.   The worst pain imaginable 10 At this level, most patients pass out from pain. When this level is reached, collapse of the autonomic nervous system occurs, leading to a sudden drop in blood pressure and heart rate. This in turn results in a temporary and dramatic drop in blood flow to the brain, leading to a loss of consciousness. Fainting is one of the body's self defense mechanisms. Passing out puts the brain in a calmed state and causes it to shut down for a while, in order to begin the healing process.    Summary: 1. Refer to this scale when providing [place] with your pain level. 2. Be accurate and careful when reporting your pain level. This will help with your care. 3. Over-reporting your pain level will lead to loss of credibility. 4. Even a level of 1/10 means that there is pain and will be treated at our facility. 5. High, inaccurate reporting will be documented as "Symptom Exaggeration", leading to loss of credibility and suspicions of possible secondary gains such as obtaining more narcotics, or wanting to appear disabled, for fraudulent reasons. 6. Only  pain levels of 5 or below will be seen at our facility. 7. Pain levels of 6 and above will be sent to the Emergency Department and the appointment cancelled. ____________________________________________________________________________________________

## 2017-04-24 ENCOUNTER — Encounter: Payer: Self-pay | Admitting: Pain Medicine

## 2017-04-25 ENCOUNTER — Ambulatory Visit
Admission: RE | Admit: 2017-04-25 | Discharge: 2017-04-25 | Disposition: A | Discharge status: Home / Self Care | Payer: Medicare HMO | Source: Ambulatory Visit | Attending: Pain Medicine | Admitting: Pain Medicine

## 2017-04-25 ENCOUNTER — Encounter: Payer: Self-pay | Admitting: Pain Medicine

## 2017-04-25 ENCOUNTER — Ambulatory Visit (HOSPITAL_BASED_OUTPATIENT_CLINIC_OR_DEPARTMENT_OTHER): Payer: Medicare HMO | Admitting: Pain Medicine

## 2017-04-25 VITALS — BP 146/74 | HR 63 | Temp 98.3°F | Resp 18 | Ht 62.0 in | Wt 170.0 lb

## 2017-04-25 DIAGNOSIS — M79605 Pain in left leg: Secondary | ICD-10-CM | POA: Diagnosis not present

## 2017-04-25 DIAGNOSIS — M79604 Pain in right leg: Secondary | ICD-10-CM

## 2017-04-25 DIAGNOSIS — M961 Postlaminectomy syndrome, not elsewhere classified: Secondary | ICD-10-CM | POA: Insufficient documentation

## 2017-04-25 DIAGNOSIS — Z881 Allergy status to other antibiotic agents status: Secondary | ICD-10-CM | POA: Insufficient documentation

## 2017-04-25 DIAGNOSIS — M5136 Other intervertebral disc degeneration, lumbar region: Secondary | ICD-10-CM

## 2017-04-25 DIAGNOSIS — Z888 Allergy status to other drugs, medicaments and biological substances status: Secondary | ICD-10-CM | POA: Insufficient documentation

## 2017-04-25 DIAGNOSIS — G8929 Other chronic pain: Secondary | ICD-10-CM | POA: Insufficient documentation

## 2017-04-25 DIAGNOSIS — Z9889 Other specified postprocedural states: Secondary | ICD-10-CM | POA: Diagnosis not present

## 2017-04-25 DIAGNOSIS — Z9049 Acquired absence of other specified parts of digestive tract: Secondary | ICD-10-CM | POA: Insufficient documentation

## 2017-04-25 DIAGNOSIS — M549 Dorsalgia, unspecified: Secondary | ICD-10-CM | POA: Diagnosis present

## 2017-04-25 DIAGNOSIS — Z90710 Acquired absence of both cervix and uterus: Secondary | ICD-10-CM | POA: Insufficient documentation

## 2017-04-25 DIAGNOSIS — M533 Sacrococcygeal disorders, not elsewhere classified: Secondary | ICD-10-CM

## 2017-04-25 MED ORDER — ROPIVACAINE HCL 2 MG/ML IJ SOLN
2.0000 mL | Freq: Once | INTRAMUSCULAR | Status: AC
  Administered 2017-04-25: 10 mL via EPIDURAL

## 2017-04-25 MED ORDER — TRIAMCINOLONE ACETONIDE 40 MG/ML IJ SUSP
40.0000 mg | Freq: Once | INTRAMUSCULAR | Status: AC
  Administered 2017-04-25: 40 mg

## 2017-04-25 MED ORDER — SODIUM CHLORIDE 0.9 % IJ SOLN
INTRAMUSCULAR | Status: AC
  Filled 2017-04-25: qty 10

## 2017-04-25 MED ORDER — ROPIVACAINE HCL 2 MG/ML IJ SOLN
INTRAMUSCULAR | Status: AC
  Filled 2017-04-25: qty 10

## 2017-04-25 MED ORDER — IOPAMIDOL (ISOVUE-M 200) INJECTION 41%
10.0000 mL | Freq: Once | INTRAMUSCULAR | Status: AC
  Administered 2017-04-25: 10 mL via EPIDURAL
  Filled 2017-04-25: qty 10

## 2017-04-25 MED ORDER — LIDOCAINE HCL 2 % IJ SOLN
INTRAMUSCULAR | Status: AC
  Filled 2017-04-25: qty 20

## 2017-04-25 MED ORDER — LIDOCAINE HCL 2 % IJ SOLN
20.0000 mL | Freq: Once | INTRAMUSCULAR | Status: AC
  Administered 2017-04-25: 400 mg

## 2017-04-25 MED ORDER — TRIAMCINOLONE ACETONIDE 40 MG/ML IJ SUSP
INTRAMUSCULAR | Status: AC
  Filled 2017-04-25: qty 1

## 2017-04-25 MED ORDER — SODIUM CHLORIDE 0.9% FLUSH
2.0000 mL | Freq: Once | INTRAVENOUS | Status: AC
  Administered 2017-04-25: 10 mL

## 2017-04-25 NOTE — Progress Notes (Addendum)
Patient's Name: Ashley Fields  MRN: [id number]  Referring Provider: Milinda Pointer, MD  DOB: [birth year]  PCP: Olin Hauser, DO  DOS: 04/25/2017  Note by: Gaspar Cola, MD  Service setting: Ambulatory outpatient  Specialty: Interventional Pain Management  Patient type: Established  Location: [facility] (AMB) Pain Management Facility  Visit type: Interventional Procedure   Primary Reason for Visit: Interventional Pain Management Treatment. CC: Back Pain (Low back radiates down Left thigh)  Procedure:  Anesthesia, Analgesia, Anxiolysis:  Type: Diagnostic Epidural Steroid Injection + Diagnostic Epidurogram Region: Caudal Level: Sacrococcygeal   Laterality: Midline aiming at the left  Type: Local Anesthesia Indication(s): Analgesia         Route: Infiltration (/IM) IV Access: Declined Sedation: Declined  Local Anesthetic: Lidocaine 1-2%   Indications: 1. DDD (degenerative disc disease), lumbar   2. Failed back surgical syndrome   3. Chronic lower extremity pain (Tertiary Area of Pain) (Bilateral) (L>R)    Pain Score: Pre-procedure: 3 /10 Post-procedure: 0-No pain/10  Pre-op Assessment:  Ashley Fields is a 62 y.o. (year old), female patient, seen today for interventional treatment. She  has a past surgical history that includes Cesarean section (07/27/1981); Abdominal hysterectomy (1987); Breast reduction surgery (Bilateral, 1997); Cholecystectomy (03/2004); Appendectomy (12/2008); Gastric bypass (2005); mini tummy tuck (05/07/2016); breast lift bilateral, implants (05/18/2015); Lumbar laminectomy (06/2006); 5 miscarriages; neck surgery C3-C7 (02/10/2015); IVC FILTER INSERTION (12/2003); Spinal cord stimulator implant (11/2010); and Shoulder acromioplasty (Left, 03/27/2013). Ashley Fields has a current medication list which includes the following prescription(s): biotin, butalbital-acetaminophen-caffeine, cholecalciferol, estradiol, ferrous sulfate, fluticasone, gabapentin,  lorazepam, magnesium, melatonin, multivitamin with minerals, omeprazole, ondansetron, sertraline, simvastatin, tizanidine, tramadol, trazodone, turmeric, vitamin b-12, vitamin c, and vitamin e. Her primarily concern today is the Back Pain (Low back radiates down Left thigh)  Initial Vital Signs:  Pulse/HCG Rate: 63ECG Heart Rate: 62 Temp: 98.3 F (36.8 C) Resp: 18 BP: 120/74 SpO2: 100 %  BMI: Estimated body mass index is 31.09 kg/m as calculated from the following:   Height as of this encounter: 5\' 2"  (1.575 m).   Weight as of this encounter: 170 lb (77.1 kg).  Risk Assessment: Allergies: Reviewed. She is allergic to flagyl [metronidazole] and tape.  Allergy Precautions: None required Coagulopathies: Reviewed. None identified.  Blood-thinner therapy: None at this time Active Infection(s): Reviewed. None identified. Ashley Fields is afebrile  Site Confirmation: Ashley Fields was asked to confirm the procedure and laterality before marking the site Procedure checklist: Completed Consent: Before the procedure and under the influence of no sedative(s), amnesic(s), or anxiolytics, the patient was informed of the treatment options, risks and possible complications. To fulfill our ethical and legal obligations, as recommended by the American Medical Association's Code of Ethics, I have informed the patient of my clinical impression; the nature and purpose of the treatment or procedure; the risks, benefits, and possible complications of the intervention; the alternatives, including doing nothing; the risk(s) and benefit(s) of the alternative treatment(s) or procedure(s); and the risk(s) and benefit(s) of doing nothing. The patient was provided information about the general risks and possible complications associated with the procedure. These may include, but are not limited to: failure to achieve desired goals, infection, bleeding, organ or nerve damage, allergic reactions, paralysis, and death. In  addition, the patient was informed of those risks and complications associated to Spine-related procedures, such as failure to decrease pain; infection (i.e.: Meningitis, epidural or intraspinal abscess); bleeding (i.e.: epidural hematoma, subarachnoid hemorrhage, or any other type of intraspinal or peri-dural bleeding); organ  or nerve damage (i.e.: Any type of peripheral nerve, nerve root, or spinal cord injury) with subsequent damage to sensory, motor, and/or autonomic systems, resulting in permanent pain, numbness, and/or weakness of one or several areas of the body; allergic reactions; (i.e.: anaphylactic reaction); and/or death. Furthermore, the patient was informed of those risks and complications associated with the medications. These include, but are not limited to: allergic reactions (i.e.: anaphylactic or anaphylactoid reaction(s)); adrenal axis suppression; blood sugar elevation that in diabetics may result in ketoacidosis or comma; water retention that in patients with history of congestive heart failure may result in shortness of breath, pulmonary edema, and decompensation with resultant heart failure; weight gain; swelling or edema; medication-induced neural toxicity; particulate matter embolism and blood vessel occlusion with resultant organ, and/or nervous system infarction; and/or aseptic necrosis of one or more joints. Finally, the patient was informed that Medicine is not an exact science; therefore, there is also the possibility of unforeseen or unpredictable risks and/or possible complications that may result in a catastrophic outcome. The patient indicated having understood very clearly. We have given the patient no guarantees and we have made no promises. Enough time was given to the patient to ask questions, all of which were answered to the patient's satisfaction. Ashley Fields has indicated that she wanted to continue with the procedure. Attestation: I, the ordering provider, attest that I  have discussed with the patient the benefits, risks, side-effects, alternatives, likelihood of achieving goals, and potential problems during recovery for the procedure that I have provided informed consent. Date  Time: 04/25/2017  8:53 AM  Pre-Procedure Preparation:  Monitoring: As per clinic protocol. Respiration, ETCO2, SpO2, BP, heart rate and rhythm monitor placed and checked for adequate function Safety Precautions: Patient was assessed for positional comfort and pressure points before starting the procedure. Time-out: I initiated and conducted the "Time-out" before starting the procedure, as per protocol. The patient was asked to participate by confirming the accuracy of the "Time Out" information. Verification of the correct person, site, and procedure were performed and confirmed by me, the nursing staff, and the patient. "Time-out" conducted as per Joint Commission's Universal Protocol (UP.01.01.01). Time: 0957  Description of Procedure Process:   Position: Prone Target Area: Caudal Epidural Canal. Approach: Midline approach. Area Prepped: Entire Posterior Sacrococcygeal Region Prepping solution: ChloraPrep (2% chlorhexidine gluconate and 70% isopropyl alcohol) Safety Precautions: Aspiration looking for blood return was conducted prior to all injections. At no point did we inject any substances, as a needle was being advanced. No attempts were made at seeking any paresthesias. Safe injection practices and needle disposal techniques used. Medications properly checked for expiration dates. SDV (single dose vial) medications used. Description of the Procedure: Protocol guidelines were followed. The patient was placed in position over the fluoroscopy table. The target area was identified and the area prepped in the usual manner. Skin desensitized using vapocoolant spray. Skin & deeper tissues infiltrated with local anesthetic. Appropriate amount of time allowed to pass for local anesthetics to  take effect. The procedure needles were then advanced to the target area. Proper needle placement secured. Negative aspiration confirmed. Solution injected in intermittent fashion, asking for systemic symptoms every 0.5cc of injectate. The needles were then removed and the area cleansed, making sure to leave some of the prepping solution back to take advantage of its long term bactericidal properties. Vitals:   04/25/17 0955 04/25/17 1000 04/25/17 1005 04/25/17 1011  BP: 140/79 (!) 150/90 (!) 148/91 (!) 146/74  Pulse:  Resp: 12 13 13 18   Temp:      TempSrc:      SpO2: 98% 99% 99% 97%  Weight:      Height:        Start Time: 0957 hrs. End Time: 1008 hrs. Materials:  Needle(s) Type: Epidural needle Gauge: 17G Length: 3.5-in Medication(s): Please see orders for medications and dosing details.  Imaging Guidance (Spinal):  Type of Imaging Technique: Fluoroscopy Guidance (Spinal) Indication(s): Assistance in needle guidance and placement for procedures requiring needle placement in or near specific anatomical locations not easily accessible without such assistance. Exposure Time: Please see nurses notes. Contrast: Before injecting any contrast, we confirmed that the patient did not have an allergy to iodine, shellfish, or radiological contrast. Once satisfactory needle placement was completed at the desired level, radiological contrast was injected. Contrast injected under live fluoroscopy. No contrast complications. See chart for type and volume of contrast used. Fluoroscopic Guidance: I was personally present during the use of fluoroscopy. "Tunnel Vision Technique" used to obtain the best possible view of the target area. Parallax error corrected before commencing the procedure. "Direction-depth-direction" technique used to introduce the needle under continuous pulsed fluoroscopy. Once target was reached, antero-posterior, oblique, and lateral fluoroscopic projection used confirm needle  placement in all planes. Images permanently stored in EMR. Interpretation: I personally interpreted the imaging intraoperatively. Adequate needle placement confirmed in multiple planes. Appropriate spread of contrast into desired area was observed. No evidence of afferent or efferent intravascular uptake. No intrathecal or subarachnoid spread observed. Permanent images saved into the patient's record.      Diagnostic Epidurogram:  Contrast: Before injecting any contrast, we confirmed that the patient did not have an allergy to iodine, shellfish, or radiological contrast. For accuracy purposes, contrast was injected under live fluoroscopy. Study personally interpreted intraoparatively. Type: Non-ionic, water soluble, hypoallergenic, myelogram-compatible, radiological contrast used. Please see orders and nurses note for specific choice of contrast. Volume: Please see nurses note for injected volume.  Observations:  Spinal Alignment: Adequate       Vertebral body: Intact Lamina: Surgical changes observed Disc: Loss of disc hight Facet: Arthritic changes  Multilevel Hardware: Posterior fusion device using pedicle screws  Spread: Abnormal contrast spread. See below. Anterior: No flow of contrast Posterior: Defect identified Superior (cephalad): Defect identified Inferior (caudad): Adequate Right lateral: Flow limited to: S1, S2, and S3.  No flow seen at the L5 level.  No flow above the L5-S1 intervertebral disc margin. Left lateral: Flow limited to: L5, S1, S2, and S3 with most of the flow in the S3.  No flow above the mid L5 vertebral body where the laminectomy begins. Plica medialis dorsalis: ill-defined Nerve root(s): ill-defined.  No flow observed on the right L5 nerve root. Epidural extravasation: None observed Intrathecal: No intrathecal spread identified Subarachnoid: No subarachnoid spread pattern observed Vascular: No evidence of afferent or efferent intravascular  uptake  Impression: Technically successful epidurogram. See above for details Note: Hard copies saved to EMR.  Antibiotic Prophylaxis:   Anti-infectives (From admission, onward)   None     Indication(s): None identified  Post-operative Assessment:  Post-procedure Vital Signs:  Pulse/HCG Rate: 6365 Temp: 98.3 F (36.8 C) Resp: 18 BP: (!) 146/74 SpO2: 97 %  EBL: None  Complications: No immediate post-treatment complications observed by team, or reported by patient.  Note: The patient tolerated the entire procedure well. A repeat set of vitals were taken after the procedure and the patient was kept under observation following institutional policy, for  this type of procedure. Post-procedural neurological assessment was performed, showing return to baseline, prior to discharge. The patient was provided with post-procedure discharge instructions, including a section on how to identify potential problems. Should any problems arise concerning this procedure, the patient was given instructions to immediately contact us, at any time, without hesitation. In any case, we plan to contact the patient by telephone for a follow-up status report regarding this interventional procedure.  Comments:  No additional relevant information.  Plan of Care    Imaging Orders     DG C-Arm 1-60 Min-No Report  Procedure Orders     Caudal Epidural Injection  Medications ordered for procedure: Meds ordered this encounter  Medications  . iopamidol (ISOVUE-M) 41 % intrathecal injection 10 mL    Must be Myelogram-compatible. If not available, you may substitute with a water-soluble, non-ionic, hypoallergenic, myelogram-compatible radiological contrast medium.  Marland Kitchen lidocaine (XYLOCAINE) 2 % (with pres) injection 400 mg  . sodium chloride flush (NS) 0.9 % injection 2 mL  . ropivacaine (PF) 2 mg/mL (0.2%) (NAROPIN) injection 2 mL  . triamcinolone acetonide (KENALOG-40) injection 40 mg   Medications  administered: We administered iopamidol, lidocaine, sodium chloride flush, ropivacaine (PF) 2 mg/mL (0.2%), and triamcinolone acetonide.  See the medical record for exact dosing, route, and time of administration.  New Prescriptions   No medications on file   Disposition: Discharge home  Discharge Date & Time: 04/25/2017; 1014 hrs.   Physician-requested Follow-up: Return for post-procedure eval (2 wks), w/ Dr. Dossie Arbour.  Future Appointments  Date Time Provider [place]  05/06/2017  2:00 PM [facility]  05/09/2017  9:00 AM Vevelyn Francois, NP [facility] None  05/22/2017  9:15 AM Milinda Pointer, MD [facility] None  07/05/2017 11:00 AM Parks Ranger, Devonne Doughty, DO [facility] None   Primary Care Physician: Olin Hauser, DO Location: [facility] Note by: Gaspar Cola, MD Date: 04/25/2017; Time: 2:20 PM  Disclaimer:  Medicine is not an Chief Strategy Officer. The only guarantee in medicine is that nothing is guaranteed. It is important to note that the decision to proceed with this intervention was based on the information collected from the patient. The Data and conclusions were drawn from the patient's questionnaire, the interview, and the physical examination. Because the information was provided in large part by the patient, it cannot be guaranteed that it has not been purposely or unconsciously manipulated. Every effort has been made to obtain as much relevant data as possible for this evaluation. It is important to note that the conclusions that lead to this procedure are derived in large part from the available data. Always take into account that the treatment will also be dependent on availability of resources and existing treatment guidelines, considered by other Pain Management Practitioners as being common knowledge and practice, at the time of the intervention. For Medico-Legal purposes, it is also important to point out that  variation in procedural techniques and pharmacological choices are the acceptable norm. The indications, contraindications, technique, and results of the above procedure should only be interpreted and judged by a Board-Certified Interventional Pain Specialist with extensive familiarity and expertise in the same exact procedure and technique.

## 2017-04-25 NOTE — Patient Instructions (Signed)

## 2017-04-26 ENCOUNTER — Telehealth: Payer: Self-pay

## 2017-04-26 NOTE — Telephone Encounter (Signed)
Post procedure phone call.  Left message.  

## 2017-04-29 DIAGNOSIS — H26493 Other secondary cataract, bilateral: Secondary | ICD-10-CM | POA: Diagnosis not present

## 2017-04-29 DIAGNOSIS — H26491 Other secondary cataract, right eye: Secondary | ICD-10-CM | POA: Diagnosis not present

## 2017-05-06 ENCOUNTER — Ambulatory Visit
Admission: RE | Admit: 2017-05-06 | Discharge: 2017-05-06 | Disposition: A | Discharge status: Home / Self Care | Payer: Medicare HMO | Source: Ambulatory Visit | Attending: Family Medicine | Admitting: Family Medicine

## 2017-05-06 DIAGNOSIS — Z1231 Encounter for screening mammogram for malignant neoplasm of breast: Secondary | ICD-10-CM | POA: Insufficient documentation

## 2017-05-06 DIAGNOSIS — Z1239 Encounter for other screening for malignant neoplasm of breast: Secondary | ICD-10-CM

## 2017-05-09 ENCOUNTER — Encounter: Payer: Self-pay | Admitting: Nurse Practitioner

## 2017-05-09 ENCOUNTER — Ambulatory Visit: Payer: Medicare HMO | Attending: Nurse Practitioner | Admitting: Nurse Practitioner

## 2017-05-09 VITALS — BP 105/65 | HR 58 | Temp 98.2°F | Resp 18 | Ht 62.0 in | Wt 167.0 lb

## 2017-05-09 DIAGNOSIS — M79605 Pain in left leg: Secondary | ICD-10-CM | POA: Diagnosis not present

## 2017-05-09 DIAGNOSIS — M25562 Pain in left knee: Secondary | ICD-10-CM | POA: Insufficient documentation

## 2017-05-09 DIAGNOSIS — Z981 Arthrodesis status: Secondary | ICD-10-CM | POA: Insufficient documentation

## 2017-05-09 DIAGNOSIS — B1920 Unspecified viral hepatitis C without hepatic coma: Secondary | ICD-10-CM | POA: Diagnosis not present

## 2017-05-09 DIAGNOSIS — G8929 Other chronic pain: Secondary | ICD-10-CM

## 2017-05-09 DIAGNOSIS — M545 Low back pain: Secondary | ICD-10-CM | POA: Insufficient documentation

## 2017-05-09 DIAGNOSIS — Z90710 Acquired absence of both cervix and uterus: Secondary | ICD-10-CM | POA: Insufficient documentation

## 2017-05-09 DIAGNOSIS — M25512 Pain in left shoulder: Secondary | ICD-10-CM | POA: Insufficient documentation

## 2017-05-09 DIAGNOSIS — Z79891 Long term (current) use of opiate analgesic: Secondary | ICD-10-CM | POA: Insufficient documentation

## 2017-05-09 DIAGNOSIS — Z87442 Personal history of urinary calculi: Secondary | ICD-10-CM | POA: Insufficient documentation

## 2017-05-09 DIAGNOSIS — M1610 Unilateral primary osteoarthritis, unspecified hip: Secondary | ICD-10-CM | POA: Diagnosis not present

## 2017-05-09 DIAGNOSIS — M5442 Lumbago with sciatica, left side: Secondary | ICD-10-CM

## 2017-05-09 DIAGNOSIS — M5441 Lumbago with sciatica, right side: Secondary | ICD-10-CM

## 2017-05-09 DIAGNOSIS — M47896 Other spondylosis, lumbar region: Secondary | ICD-10-CM | POA: Insufficient documentation

## 2017-05-09 DIAGNOSIS — G894 Chronic pain syndrome: Secondary | ICD-10-CM | POA: Diagnosis not present

## 2017-05-09 DIAGNOSIS — Z9884 Bariatric surgery status: Secondary | ICD-10-CM | POA: Insufficient documentation

## 2017-05-09 DIAGNOSIS — M533 Sacrococcygeal disorders, not elsewhere classified: Secondary | ICD-10-CM | POA: Insufficient documentation

## 2017-05-09 DIAGNOSIS — M79604 Pain in right leg: Secondary | ICD-10-CM | POA: Diagnosis not present

## 2017-05-09 DIAGNOSIS — K760 Fatty (change of) liver, not elsewhere classified: Secondary | ICD-10-CM | POA: Insufficient documentation

## 2017-05-09 DIAGNOSIS — M47816 Spondylosis without myelopathy or radiculopathy, lumbar region: Secondary | ICD-10-CM | POA: Diagnosis not present

## 2017-05-09 DIAGNOSIS — M48062 Spinal stenosis, lumbar region with neurogenic claudication: Secondary | ICD-10-CM | POA: Diagnosis not present

## 2017-05-09 DIAGNOSIS — Z79899 Other long term (current) drug therapy: Secondary | ICD-10-CM | POA: Insufficient documentation

## 2017-05-09 DIAGNOSIS — M48061 Spinal stenosis, lumbar region without neurogenic claudication: Secondary | ICD-10-CM | POA: Diagnosis not present

## 2017-05-09 DIAGNOSIS — K219 Gastro-esophageal reflux disease without esophagitis: Secondary | ICD-10-CM | POA: Diagnosis not present

## 2017-05-09 DIAGNOSIS — M542 Cervicalgia: Secondary | ICD-10-CM | POA: Diagnosis not present

## 2017-05-09 DIAGNOSIS — M5136 Other intervertebral disc degeneration, lumbar region: Secondary | ICD-10-CM | POA: Diagnosis not present

## 2017-05-09 MED ORDER — GABAPENTIN 800 MG PO TABS: 800.0000 mg | ORAL_TABLET | Freq: Four times a day (QID) | ORAL | 0 refills | Status: DC

## 2017-05-09 MED ORDER — TRAMADOL HCL 50 MG PO TABS: 50.0000 mg | ORAL_TABLET | Freq: Four times a day (QID) | ORAL | 1 refills | Status: DC | PRN

## 2017-05-09 MED ORDER — ETODOLAC 500 MG PO TABS: 500.0000 mg | ORAL_TABLET | Freq: Two times a day (BID) | ORAL | 1 refills | Status: DC

## 2017-05-09 MED ORDER — TIZANIDINE HCL 4 MG PO TABS: 4.0000 mg | ORAL_TABLET | Freq: Three times a day (TID) | ORAL | 1 refills | Status: DC

## 2017-05-09 NOTE — Progress Notes (Signed)
Nursing Pain Medication Assessment:  Safety precautions to be maintained throughout the outpatient stay will include: orient to surroundings, keep bed in low position, maintain call bell within reach at all times, provide assistance with transfer out of bed and ambulation.  Medication Inspection Compliance: Pill count conducted under aseptic conditions, in front of the patient. Neither the pills nor the bottle was removed from the patient's sight at any time. Once count was completed pills were immediately returned to the patient in their original bottle.  Medication: Tramadol (Ultram) Pill/Patch Count: 18 of 120 pills remain Pill/Patch Appearance: Markings consistent with prescribed medication Bottle Appearance: Standard pharmacy container. Clearly labeled. Filled Date: 04 / 08 / 2019 Last Medication intake:  Today

## 2017-05-09 NOTE — Patient Instructions (Signed)
____________________________________________________________________________________________  Medication Rules  Applies to: All patients receiving prescriptions (written or electronic).  Pharmacy of record: Pharmacy where electronic prescriptions will be sent. If written prescriptions are taken to a different pharmacy, please inform the nursing staff. The pharmacy listed in the electronic medical record should be the one where you would like electronic prescriptions to be sent.  Prescription refills: Only during scheduled appointments. Applies to both, written and electronic prescriptions.  NOTE: The following applies primarily to controlled substances (Opioid* Pain Medications).   Patient's responsibilities: 1. Pain Pills: Bring all pain pills to every appointment (except for procedure appointments). 2. Pill Bottles: Bring pills in original pharmacy bottle. Always bring newest bottle. Bring bottle, even if empty. 3. Medication refills: You are responsible for knowing and keeping track of what medications you need refilled. The day before your appointment, write a list of all prescriptions that need to be refilled. Bring that list to your appointment and give it to the admitting nurse. Prescriptions will be written only during appointments. If you forget a medication, it will not be "Called in", "Faxed", or "electronically sent". You will need to get another appointment to get these prescribed. 4. Prescription Accuracy: You are responsible for carefully inspecting your prescriptions before leaving our office. Have the discharge nurse carefully go over each prescription with you, before taking them home. Make sure that your name is accurately spelled, that your address is correct. Check the name and dose of your medication to make sure it is accurate. Check the number of pills, and the written instructions to make sure they are clear and accurate. Make sure that you are given enough medication to last  until your next medication refill appointment. 5. Taking Medication: Take medication as prescribed. Never take more pills than instructed. Never take medication more frequently than prescribed. Taking less pills or less frequently is permitted and encouraged, when it comes to controlled substances (written prescriptions).  6. Inform other Doctors: Always inform, all of your healthcare providers, of all the medications you take. 7. Pain Medication from other Providers: You are not allowed to accept any additional pain medication from any other Doctor or Healthcare provider. There are two exceptions to this rule. (see below) In the event that you require additional pain medication, you are responsible for notifying us, as stated below. 8. Medication Agreement: You are responsible for carefully reading and following our Medication Agreement. This must be signed before receiving any prescriptions from our practice. Safely store a copy of your signed Agreement. Violations to the Agreement will result in no further prescriptions. (Additional copies of our Medication Agreement are available upon request.) 9. Laws, Rules, & Regulations: All patients are expected to follow all Federal and State Laws, Statutes, Rules, & Regulations. Ignorance of the Laws does not constitute a valid excuse. The use of any illegal substances is prohibited. 10. Adopted CDC guidelines & recommendations: Target dosing levels will be at or below 60 MME/day. Use of benzodiazepines** is not recommended.  Exceptions: There are only two exceptions to the rule of not receiving pain medications from other Healthcare Providers. 1. Exception #1 (Emergencies): In the event of an emergency (i.e.: accident requiring emergency care), you are allowed to receive additional pain medication. However, you are responsible for: As soon as you are able, call our office [phone], at any time of the day or night, and leave a message stating your name, the  date and nature of the emergency, and the name and dose of the medication   prescribed. In the event that your call is answered by a member of our staff, make sure to document and save the date, time, and the name of the person that took your information.  2. Exception #2 (Planned Surgery): In the event that you are scheduled by another doctor or dentist to have any type of surgery or procedure, you are allowed (for a period no longer than 30 days), to receive additional pain medication, for the acute post-op pain. However, in this case, you are responsible for picking up a copy of our "Post-op Pain Management for Surgeons" handout, and giving it to your surgeon or dentist. This document is available at our office, and does not require an appointment to obtain it. Simply go to our office during business hours (Monday-Thursday from 8:00 AM to 4:00 PM) (Friday 8:00 AM to 12:00 Noon) or if you have a scheduled appointment with us, prior to your surgery, and ask for it by name. In addition, you will need to provide us with your name, name of your surgeon, type of surgery, and date of procedure or surgery.  *Opioid medications include: morphine, codeine, oxycodone, oxymorphone, hydrocodone, hydromorphone, meperidine, tramadol, tapentadol, buprenorphine, fentanyl, methadone. **Benzodiazepine medications include: diazepam (Valium), alprazolam (Xanax), clonazepam (Klonopine), lorazepam (Ativan), clorazepate (Tranxene), chlordiazepoxide (Librium), estazolam (Prosom), oxazepam (Serax), temazepam (Restoril), triazolam (Halcion) (Last updated: 03/07/2017) ____________________________________________________________________________________________    

## 2017-05-09 NOTE — Progress Notes (Signed)
Patient's Name: Ashley Fields  MRN: [id number]  Referring Provider: Cletis Athens, MD  DOB: 09-25-1955  PCP: Olin Hauser, DO  DOS: 05/09/2017  Note by: Vevelyn Francois NP  Service setting: Ambulatory outpatient  Specialty: Interventional Pain Management  Location: [facility] (AMB) Pain Management Facility    Patient type: Established    Primary Reason(s) for Visit: Encounter for prescription drug management. (Level of risk: moderate)  CC: Back Pain (low)  HPI  Ashley Fields is a 62 y.o. year old, female patient, who comes today for a medication management evaluation. She has Chronic pain syndrome; Chronic neck pain ; Chronic hip pain (Secondary Area of Pain) (Bilateral) (L>R); Long term current use of opiate analgesic; Long term prescription opiate use; Opiate use; Chronic knee pain (Left); Osteoarthritis of hip (Bilateral); Chronic Low back pain (Primary Area of Pain) (Bilateral)  (R>L); Chronic lower extremity pain (Tertiary Area of Pain) (Bilateral) (L>R); Long term prescription benzodiazepine use; Grade 1 Anterolisthesis of L3 over L4; Failed back surgical syndrome; Chronic shoulder pain (Left); History of cervical fusion (ACDF C4-C7); DDD (degenerative disc disease), cervical; Chronic sacroiliac joint pain (Bilateral) (R>L); Lumbar Facet Syndrome (Bilateral) (R>L); Lumbar facet osteoarthritis (Bilateral); DDD (degenerative disc disease), lumbar; History of hepatitis C; Hepatic steatosis; History of nephrolithiasis; Lumbar spinal stenosis (severe) (L3-4); Lumbar lateral recess stenosis (Bilateral) (L3-4); Lumbar foraminal stenosis (L3-4) (Left); PTSD (post-traumatic stress disorder); GERD (gastroesophageal reflux disease); and Coccygodynia on their problem list. Her primarily concern today is the Back Pain (low)  Pain Assessment: Location: Lower Back Radiating: left thigh Onset: More than a month ago Duration: Chronic pain Quality: Constant, Sharp, Shooting Severity: 4 /10 (self-reported pain  score)  Note: Reported level is compatible with observation.                          Timing: Constant Modifying factors: procedure helped a little, medications, rest, vibration pad  Ashley Fields was last scheduled for an appointment on 03/11/2017 for medication management. During today's appointment we reviewed Ashley Fields's chronic pain status, as well as her outpatient medication regimen. She admits that she has bilateral back and leg pain. She states that the left is greater than the right. She admits that the pain goes down the back and side of her thigh. She admits that she does not feel like the Tramadol is effective for her pain. She has recently had a caudal injection.  .  She states that she does not feel like it is strong enough. She admits that the increase in Gabapentin has been effective for her pain.  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 She is SP gastric bypass but denies being told that this was a contraindication. She admits that she watches what she takes secondary to her Hep C.  She does feel like a NSAID would help.   The patient  reports that she does not use drugs. Her body mass index is 30.54 kg/m.  Further details on both, my assessment(s), as well as the proposed treatment plan, please see below.  Controlled Substance Pharmacotherapy Assessment REMS (Risk Evaluation and Mitigation Strategy)  Analgesic:Tramadol 50 mg, 1 tab PO q6hrs (200 mg/day) MME/day:'20mg'$ /day   Ashley Rochester, RN  05/09/2017  9:03 AM  Sign at close encounter Nursing Pain Medication Assessment:  Safety precautions to be maintained throughout the outpatient stay will include: orient to surroundings, keep bed in low position, maintain call bell within reach  at all times, provide assistance with transfer out of bed and ambulation.  Medication Inspection Compliance: Pill count conducted under aseptic conditions, in front of the patient. Neither the pills nor  the bottle was removed from the patient's sight at any time. Once count was completed pills were immediately returned to the patient in their original bottle.  Medication: Tramadol (Ultram) Pill/Patch Count: 18 of 120 pills remain Pill/Patch Appearance: Markings consistent with prescribed medication Bottle Appearance: Standard pharmacy container. Clearly labeled. Filled Date: 04 / 08 / 2019 Last Medication intake:  Today   Pharmacokinetics: Liberation and absorption (onset of action): WNL Distribution (time to peak effect): WNL Metabolism and excretion (duration of action): WNL         Pharmacodynamics: Desired effects: Analgesia: Ashley Fields reports >50% benefit. Functional ability: Patient reports that medication allows her to accomplish basic ADLs Clinically meaningful improvement in function (CMIF): Sustained CMIF goals met Perceived effectiveness: Described as relatively effective, allowing for increase in activities of daily living (ADL) Undesirable effects: Side-effects or Adverse reactions: None reported Monitoring: [place] PMP: Online review of the past 62-monthperiod conducted. Compliant with practice rules and regulations Last UDS on record: Summary  Date Value Ref Range Status  02/11/2017 FINAL  Final    Comment:    ==================================================================== TOXASSURE SELECT 13 (MW) ==================================================================== Test                             Result       Flag       Units Drug Present and Declared for Prescription Verification   Lorazepam                      811          EXPECTED   ng/mg creat    Source of lorazepam is a scheduled prescription medication.   Tramadol                       >2674        EXPECTED   ng/mg creat   O-Desmethyltramadol            >2674        EXPECTED   ng/mg creat   N-Desmethyltramadol            >2674        EXPECTED   ng/mg creat    Source of tramadol is a prescription  medication.    O-desmethyltramadol and N-desmethyltramadol are expected    metabolites of tramadol. Drug Absent but Declared for Prescription Verification   Butalbital                     Not Detected UNEXPECTED ==================================================================== Test                      Result    Flag   Units      Ref Range   Creatinine              187              mg/dL      >=20 ==================================================================== Declared Medications:  The flagging and interpretation on this report are based on the  following declared medications.  Unexpected results may arise from  inaccuracies in the declared medications.  **Note: The testing scope of this panel includes these medications:  Butalbital (Butalbital/APAP/Caffeine)  Lorazepam  Tramadol  **Note: The testing scope of this panel does not include following  reported medications:  Acetaminophen (Butalbital/APAP/Caffeine)  Caffeine (Butalbital/APAP/Caffeine)  Estradiol  Fluticasone  Gabapentin  Iron (Ferrous Sulfate)  Melatonin  Multivitamin  Omeprazole  Sertraline  Simvastatin  Tizanidine '  Trazodone  Turmeric  Vitamin B (Biotin)  Vitamin C ==================================================================== For clinical consultation, please call [phone]. ====================================================================    UDS interpretation: Compliant          Medication Assessment Form: Reviewed. Patient indicates being compliant with therapy Treatment compliance: Compliant Risk Assessment Profile: Aberrant behavior: See prior evaluations. None observed or detected today Comorbid factors increasing risk of overdose: See prior notes. No additional risks detected today Risk of substance use disorder (SUD): Low Opioid Risk Tool - 03/11/17 0903      Family History of Substance Abuse   Alcohol  Negative    Illegal Drugs  Negative    Rx Drugs  Negative       Personal History of Substance Abuse   Alcohol  Negative    Illegal Drugs  Negative    Rx Drugs  Negative      Age   Age between 47-45 years   No      History of Preadolescent Sexual Abuse   History of Preadolescent Sexual Abuse  Negative or Female      Psychological Disease   Psychological Disease  Negative    Depression  Positive      Total Score   Opioid Risk Tool Scoring  1    Opioid Risk Interpretation  Low Risk      ORT Scoring interpretation table:  Score <3 = Low Risk for SUD  Score between 4-7 = Moderate Risk for SUD  Score >8 = High Risk for Opioid Abuse   Risk Mitigation Strategies:  Patient Counseling: Covered Patient-Prescriber Agreement (PPA): Present and active  Notification to other healthcare providers: Done  Pharmacologic Plan: No change in therapy, at this time.             Laboratory Chemistry  Inflammation Markers (CRP: Acute Phase) (ESR: Chronic Phase) Lab Results  Component Value Date   CRP 0.7 08/14/2016   ESRSEDRATE 9 08/14/2016                         Rheumatology Markers No results found for: RF, ANA, Therisa Doyne, [facility]                      Renal Function Markers Lab Results  Component Value Date   BUN 17 08/14/2016   CREATININE 0.80 08/14/2016   GFRAA 93 08/14/2016   GFRNONAA 80 08/14/2016                              Hepatic Function Markers Lab Results  Component Value Date   AST 20 08/14/2016   ALT 10 08/14/2016   ALBUMIN 4.9 (H) 08/14/2016   ALKPHOS 103 08/14/2016                        Electrolytes Lab Results  Component Value Date   NA 139 08/14/2016   K 4.1 08/14/2016   CL 97 08/14/2016   CALCIUM 9.6 08/14/2016   MG 2.3 08/14/2016  Neuropathy Markers Lab Results  Component Value Date   VITAMINB12 473 08/14/2016                        Bone Pathology Markers Lab Results  Component Value Date   25OHVITD1 47 08/14/2016   25OHVITD2 <1.0 08/14/2016   25OHVITD3 47  08/14/2016                         Coagulation Parameters No results found for: INR, LABPROT, APTT, PLT, DDIMER                      Cardiovascular Markers No results found for: BNP, CKTOTAL, CKMB, TROPONINI, HGB, HCT                       CA Markers No results found for: CEA, CA125, LABCA2                      Note: Lab results reviewed.  Recent Diagnostic Imaging Results  DG C-Arm 1-60 Min-No Report Fluoroscopy was utilized by the requesting physician.  No radiographic  interpretation.   Complexity Note: Imaging results reviewed. Results shared with Ashley Fields, using Layman's terms.                         Meds   Current Outpatient Medications:  .  Biotin 10000 MCG TABS, Take 10,000 mcg by mouth 1 day or 1 dose., Disp: , Rfl:  .  butalbital-acetaminophen-caffeine (FIORICET, ESGIC) 50-325-40 MG tablet, TAKE 1 TABLET BY MOUTH EVERY NIGHT AS NEEDED FOR HEADACHE, Disp: , Rfl: 2 .  cholecalciferol (VITAMIN D) 1000 units tablet, Take 1,000 Units by mouth daily., Disp: , Rfl:  .  estradiol (ESTRACE) 1 MG tablet, Take 1 mg by mouth daily., Disp: , Rfl:  .  ferrous sulfate 325 (65 FE) MG tablet, Take 325 mg by mouth daily., Disp: , Rfl:  .  fluticasone (FLONASE) 50 MCG/ACT nasal spray, 1 SPRAY IN EACH NOSTRIL ONCE A DAY NASALLY 30 DAYS, Disp: , Rfl: 3 .  LORazepam (ATIVAN) 0.5 MG tablet, Take 1 tablet (0.5 mg total) by mouth 2 (two) times daily as needed for anxiety., Disp: 45 tablet, Rfl: 2 .  Magnesium 500 MG CAPS, Take 1 capsule (500 mg total) by mouth 2 (two) times daily at 8 am and 10 pm., Disp: 60 capsule, Rfl: 5 .  Melatonin 10 MG CAPS, Take 1 capsule by mouth at bedtime., Disp: , Rfl:  .  Multiple Vitamin (MULTIVITAMIN WITH MINERALS) TABS tablet, Take 1 tablet by mouth daily., Disp: , Rfl:  .  omeprazole (PRILOSEC) 40 MG capsule, Take 40 mg by mouth daily., Disp: , Rfl: 0 .  ondansetron (ZOFRAN ODT) 4 MG disintegrating tablet, Take 1 tablet (4 mg total) by mouth every 8 (eight)  hours as needed for nausea or vomiting., Disp: 30 tablet, Rfl: 0 .  sertraline (ZOLOFT) 100 MG tablet, Take 1 tablet (100 mg total) by mouth 2 (two) times daily., Disp: 180 tablet, Rfl: 1 .  simvastatin (ZOCOR) 20 MG tablet, TAKE 1 TABLET IN THE EVENING ONCE A DAY ORALLY 90 DAYS, Disp: , Rfl: 0 .  tiZANidine (ZANAFLEX) 4 MG tablet, Take 1 tablet (4 mg total) by mouth 3 (three) times daily., Disp: 90 tablet, Rfl: 1 .  [START ON 05/15/2017] traMADol (ULTRAM) 50 MG tablet, Take 1  tablet (50 mg total) by mouth every 6 (six) hours as needed for severe pain., Disp: 120 tablet, Rfl: 1 .  traZODone (DESYREL) 150 MG tablet, Take 150 mg by mouth at bedtime., Disp: , Rfl: 0 .  Turmeric 500 MG CAPS, Take 2 capsules by mouth daily., Disp: , Rfl:  .  vitamin B-12 (CYANOCOBALAMIN) 500 MCG tablet, Take 500 mcg by mouth daily., Disp: , Rfl:  .  vitamin C (ASCORBIC ACID) 500 MG tablet, Take 500 mg 2 (two) times daily by mouth., Disp: , Rfl:  .  vitamin E 100 UNIT capsule, Take by mouth daily., Disp: , Rfl:  .  etodolac (LODINE) 500 MG tablet, Take 1 tablet (500 mg total) by mouth 2 (two) times daily., Disp: 60 tablet, Rfl: 1 .  gabapentin (NEURONTIN) 800 MG tablet, Take 1 tablet (800 mg total) by mouth QID., Disp: 360 tablet, Rfl: 0  ROS  Constitutional: Denies any fever or chills Gastrointestinal: No reported hemesis, hematochezia, vomiting, or acute GI distress Musculoskeletal: Denies any acute onset joint swelling, redness, loss of ROM, or weakness Neurological: No reported episodes of acute onset apraxia, aphasia, dysarthria, agnosia, amnesia, paralysis, loss of coordination, or loss of consciousness  Allergies  Ashley Fields is allergic to flagyl [metronidazole] and tape.  PFSH  Drug: Ashley Fields  reports that she does not use drugs. Alcohol:  reports that she does not drink alcohol. Tobacco:  reports that she has quit smoking. She quit after 4.00 years of use. She has never used smokeless tobacco. Medical:   has a past medical history of Allergy, Anemia, Chronic kidney disease, Depression, Disp fx of cuboid bone of right foot, init for clos fx (01/01/2017), GERD (gastroesophageal reflux disease), Headache, Hepatitis C, Hyperlipidemia, Osteoporosis, Thyroid disease, and Urine incontinence. Surgical: Ashley Fields  has a past surgical history that includes Cesarean section (07/27/1981); Abdominal hysterectomy (1987); Breast reduction surgery (Bilateral, 1997); Cholecystectomy (03/2004); Appendectomy (12/2008); Gastric bypass (2005); mini tummy tuck (05/07/2016); breast lift bilateral, implants (05/18/2015); Lumbar laminectomy (06/2006); 5 miscarriages; neck surgery C3-C7 (02/10/2015); IVC FILTER INSERTION (12/2003); Spinal cord stimulator implant (11/2010); Shoulder acromioplasty (Left, 03/27/2013); Reduction mammaplasty (Bilateral, 1997); Augmentation mammaplasty (Bilateral, 2015); and Augmentation mammaplasty (Bilateral, 2018). Family: family history includes COPD in her mother; Colon polyps in her sister; Depression in her sister; Diabetes in her brother and mother; Heart attack (age of onset: 62) in her father; Heart disease in her brother, father, and mother; Lung cancer in her mother; Stroke in her mother.  Constitutional Exam  General appearance: Well nourished, well developed, and well hydrated. In no apparent acute distress Vitals:   05/09/17 0852  BP: 105/65  Pulse: (!) 58  Resp: 18  Temp: 98.2 F (36.8 C)  TempSrc: Oral  SpO2: 100%  Weight: 167 lb (75.8 kg)  Height: _0  (1.575 m)  Psych/Mental status: Alert, oriented x 3 (person, place, & time)       Eyes: PERLA Respiratory: No evidence of acute respiratory distress  Lumbar Spine Area Exam  Skin & Axial Inspection: No masses, redness, or swelling Alignment: Symmetrical Functional ROM: Unrestricted ROM       Stability: No instability detected Muscle Tone/Strength: Functionally intact. No obvious neuro-muscular anomalies  detected. Sensory (Neurological): Unimpaired Palpation: Tender       Provocative Tests: Lumbar Hyperextension and rotation test: evaluation deferred today       Lumbar Lateral bending test: evaluation deferred today       Patrick's Maneuver: evaluation deferred today  Gait & Posture Assessment  Ambulation: Unassisted Gait: Relatively normal for age and body habitus Posture: WNL   Lower Extremity Exam    Side: Right lower extremity  Side: Left lower extremity  Skin & Extremity Inspection: Skin color, temperature, and hair growth are WNL. No peripheral edema or cyanosis. No masses, redness, swelling, asymmetry, or associated skin lesions. No contractures.  Skin & Extremity Inspection: Skin color, temperature, and hair growth are WNL. No peripheral edema or cyanosis. No masses, redness, swelling, asymmetry, or associated skin lesions. No contractures.  Functional ROM: Unrestricted ROM          Functional ROM: Unrestricted ROM          Muscle Tone/Strength: Functionally intact. No obvious neuro-muscular anomalies detected.  Muscle Tone/Strength: Functionally intact. No obvious neuro-muscular anomalies detected.  Sensory (Neurological): Unimpaired  Sensory (Neurological): Unimpaired  Palpation: No palpable anomalies  Palpation: No palpable anomalies   Assessment  Primary Diagnosis & Pertinent Problem List: The primary encounter diagnosis was Lumbar spinal stenosis (severe) (L3-4). Diagnoses of Lumbar Facet Syndrome (Bilateral) (R>L), Chronic Low back pain (Primary Area of Pain) (Bilateral)  (R>L), Chronic pain syndrome, and Chronic sacroiliac joint pain (Bilateral) (R>L) were also pertinent to this visit.  Status Diagnosis  Persistent Persistent Persistent 1. Lumbar spinal stenosis (severe) (L3-4)   2. Lumbar Facet Syndrome (Bilateral) (R>L)   3. Chronic Low back pain (Primary Area of Pain) (Bilateral)  (R>L)   4. Chronic pain syndrome   5. Chronic sacroiliac joint pain  (Bilateral) (R>L)     Problems updated and reviewed during this visit: No problems updated. Plan of Care  Pharmacotherapy (Medications Ordered): Meds ordered this encounter  Medications  . tiZANidine (ZANAFLEX) 4 MG tablet    Sig: Take 1 tablet (4 mg total) by mouth 3 (three) times daily.    Dispense:  90 tablet    Refill:  1    Do not place medication on "Automatic Refill". Fill one day early if pharmacy is closed on scheduled refill date.    Order Specific Question:   Supervising Provider    Answer:   Milinda Pointer [phone]  . etodolac (LODINE) 500 MG tablet    Sig: Take 1 tablet (500 mg total) by mouth 2 (two) times daily.    Dispense:  60 tablet    Refill:  1    Order Specific Question:   Supervising Provider    Answer:   Milinda Pointer [phone]  . gabapentin (NEURONTIN) 800 MG tablet    Sig: Take 1 tablet (800 mg total) by mouth QID.    Dispense:  360 tablet    Refill:  0    Do not place this medication, or any other prescription from our practice, on "Automatic Refill". Patient may have prescription filled one day early if pharmacy is closed on scheduled refill date.    Order Specific Question:   Supervising Provider    Answer:   Milinda Pointer [phone]  . traMADol (ULTRAM) 50 MG tablet    Sig: Take 1 tablet (50 mg total) by mouth every 6 (six) hours as needed for severe pain.    Dispense:  120 tablet    Refill:  1    Fill one day early if pharmacy is closed on scheduled refill date. Do not fill until:05/15/2017 To last until: 07/14/2017    Order Specific Question:   Supervising Provider    Answer:   Milinda Pointer [phone]   New Prescriptions   ETODOLAC (LODINE) 500 MG TABLET  Take 1 tablet (500 mg total) by mouth 2 (two) times daily.   GABAPENTIN (NEURONTIN) 800 MG TABLET    Take 1 tablet (800 mg total) by mouth QID.   Medications administered today: Shalyn Koral had no medications administered during this visit. Lab-work, procedure(s), and/or  referral(s): No orders of the defined types were placed in this encounter.  Imaging and/or referral(s): None  Interventional management options: Planned, scheduled, and/or pending:    Not at this time.    Considering:   Diagnosticbilateral lumbar epidural steroid injection Possible bilateral lumbar facet block RFA Diagnosticleft hip injection Diagnosticleft femoral nerve + obturator nerve block  Diagnostic left knee intra-articular steroid injection Diagnosticleft genicular nerve block Possible left knee RFA  Diagnostic cervical epidural steroid injection Diagnosticbilateral cervical facet block Possiblebilateral cervical RFA   PRN Procedures:   To be determined at a later time     Provider-requested follow-up: Return in about 2 months (around 07/09/2017) for MedMgmt with Me Donella Stade Edison Pace).  Future Appointments  Date Time Provider [place]  05/22/2017  9:15 AM Milinda Pointer, MD [facility] None  07/05/2017 11:00 AM Parks Ranger, Devonne Doughty, DO [facility] None   Primary Care Physician: Olin Hauser, DO Location: [facility] Note by: Vevelyn Francois NP Date: 05/09/2017; Time: 9:40 AM  Pain Score Disclaimer: We use the NRS-11 scale. This is a self-reported, subjective measurement of pain severity with only modest accuracy. It is used primarily to identify changes within a particular patient. It must be understood that outpatient pain scales are significantly less accurate that those used for research, where they can be applied under ideal controlled circumstances with minimal exposure to variables. In reality, the score is likely to be a combination of pain intensity and pain affect, where pain affect describes the degree of emotional arousal or changes in action readiness caused by the sensory experience of pain. Factors such as social and work situation, setting, emotional state, anxiety levels, expectation, and prior pain  experience may influence pain perception and show large inter-individual differences that may also be affected by time variables.  Patient instructions provided during this appointment: Patient Instructions  ____________________________________________________________________________________________  Medication Rules  Applies to: All patients receiving prescriptions (written or electronic).  Pharmacy of record: Pharmacy where electronic prescriptions will be sent. If written prescriptions are taken to a different pharmacy, please inform the nursing staff. The pharmacy listed in the electronic medical record should be the one where you would like electronic prescriptions to be sent.  Prescription refills: Only during scheduled appointments. Applies to both, written and electronic prescriptions.  NOTE: The following applies primarily to controlled substances (Opioid* Pain Medications).   Patient's responsibilities: 1. Pain Pills: Bring all pain pills to every appointment (except for procedure appointments). 2. Pill Bottles: Bring pills in original pharmacy bottle. Always bring newest bottle. Bring bottle, even if empty. 3. Medication refills: You are responsible for knowing and keeping track of what medications you need refilled. The day before your appointment, write a list of all prescriptions that need to be refilled. Bring that list to your appointment and give it to the admitting nurse. Prescriptions will be written only during appointments. If you forget a medication, it will not be "Called in", "Faxed", or "electronically sent". You will need to get another appointment to get these prescribed. 4. Prescription Accuracy: You are responsible for carefully inspecting your prescriptions before leaving our office. Have the discharge nurse carefully go over each prescription with you, before taking them home.  Make sure that your name is accurately spelled, that your address is correct. Check the  name and dose of your medication to make sure it is accurate. Check the number of pills, and the written instructions to make sure they are clear and accurate. Make sure that you are given enough medication to last until your next medication refill appointment. 5. Taking Medication: Take medication as prescribed. Never take more pills than instructed. Never take medication more frequently than prescribed. Taking less pills or less frequently is permitted and encouraged, when it comes to controlled substances (written prescriptions).  6. Inform other Doctors: Always inform, all of your healthcare providers, of all the medications you take. 7. Pain Medication from other Providers: You are not allowed to accept any additional pain medication from any other Doctor or Healthcare provider. There are two exceptions to this rule. (see below) In the event that you require additional pain medication, you are responsible for notifying us, as stated below. 8. Medication Agreement: You are responsible for carefully reading and following our Medication Agreement. This must be signed before receiving any prescriptions from our practice. Safely store a copy of your signed Agreement. Violations to the Agreement will result in no further prescriptions. (Additional copies of our Medication Agreement are available upon request.) 9. Laws, Rules, & Regulations: All patients are expected to follow all Federal and [employer], [employer], Rules, [employer]. Ignorance of the Laws does not constitute a valid excuse. The use of any illegal substances is prohibited. 10. Adopted CDC guidelines & recommendations: Target dosing levels will be at or below 60 MME/day. Use of benzodiazepines** is not recommended.  Exceptions: There are only two exceptions to the rule of not receiving pain medications from other Healthcare Providers. 1. Exception #1 (Emergencies): In the event of an emergency (i.e.: accident requiring emergency care), you  are allowed to receive additional pain medication. However, you are responsible for: As soon as you are able, call our office [phone], at any time of the day or night, and leave a message stating your name, the date and nature of the emergency, and the name and dose of the medication prescribed. In the event that your call is answered by a member of our staff, make sure to document and save the date, time, and the name of the person that took your information.  2. Exception #2 (Planned Surgery): In the event that you are scheduled by another doctor or dentist to have any type of surgery or procedure, you are allowed (for a period no longer than 30 days), to receive additional pain medication, for the acute post-op pain. However, in this case, you are responsible for picking up a copy of our "Post-op Pain Management for Surgeons" handout, and giving it to your surgeon or dentist. This document is available at our office, and does not require an appointment to obtain it. Simply go to our office during business hours (Monday-Thursday from 8:00 AM to 4:00 PM) (Friday 8:00 AM to 12:00 Noon) or if you have a scheduled appointment with [place], prior to your surgery, and ask for it by name. In addition, you will need to provide [place] with your name, name of your surgeon, type of surgery, and date of procedure or surgery.  *Opioid medications include: morphine, codeine, oxycodone, oxymorphone, hydrocodone, hydromorphone, meperidine, tramadol, tapentadol, buprenorphine, fentanyl, methadone. **Benzodiazepine medications include: diazepam (Valium), alprazolam (Xanax), clonazepam (Klonopine), lorazepam (Ativan), clorazepate (Tranxene), chlordiazepoxide (Librium), estazolam (Prosom), oxazepam (Serax), temazepam (Restoril), triazolam (Halcion) (Last updated: 03/07/2017)  ____________________________________________________________________________________________       

## 2017-05-13 ENCOUNTER — Inpatient Hospital Stay
Admission: RE | Admit: 2017-05-13 | Discharge: 2017-05-13 | Disposition: A | Discharge status: Home / Self Care | Payer: Self-pay | Source: Ambulatory Visit | Attending: *Deleted | Admitting: *Deleted

## 2017-05-13 ENCOUNTER — Other Ambulatory Visit: Payer: Self-pay | Admitting: *Deleted

## 2017-05-13 DIAGNOSIS — Z9289 Personal history of other medical treatment: Secondary | ICD-10-CM

## 2017-05-14 ENCOUNTER — Encounter: Payer: Self-pay | Admitting: Family Medicine

## 2017-05-14 ENCOUNTER — Telehealth: Payer: Self-pay | Admitting: Family Medicine

## 2017-05-14 DIAGNOSIS — H26491 Other secondary cataract, right eye: Secondary | ICD-10-CM | POA: Diagnosis not present

## 2017-05-14 DIAGNOSIS — H34211 Partial retinal artery occlusion, right eye: Secondary | ICD-10-CM | POA: Insufficient documentation

## 2017-05-14 NOTE — Telephone Encounter (Signed)
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  Recommended tests included Carotid Dopplers bilateral, and ECHOcardiogram, they specifically recommend Transesophageal ECHO for best evaluation.  I attempted to call patient, did not reach her today, left VM for her to call back to review these recommendations, or she may schedule office visit with me to discuss results. She is next due for routine visit in June 2019.  I would agree with ASA 81 and the imaging, however will need to review further with patient about pursing TEE as this is best imaging test to evaluate potential aortic plaque. I may contact Vascular Surgery as next option to get their opinion on if this patient would be proper for referral to discuss this work-up in more detail with her and get their opinion.  Nobie Putnam, [facility] 05/14/2017, 6:42 PM

## 2017-05-15 ENCOUNTER — Other Ambulatory Visit: Payer: Self-pay | Admitting: Family Medicine

## 2017-05-15 MED ORDER — ASPIRIN EC 81 MG PO TBEC
81.0000 mg | DELAYED_RELEASE_TABLET | Freq: Every day | ORAL | Status: DC
Start: 2017-05-15 — End: 2017-08-18

## 2017-05-15 NOTE — Telephone Encounter (Signed)
I called [facility] today to review this case about potential referral if they do both Carotids and TEE, and spoke with clinical staff and they called back with recommendation from [facility], [place] - stated that they would do carotid dopplers and treat appropriately, but they do not do TEE, that is done through Cardiology only and would request that we also send referral to Cardiology.  Spoke with patient. She recently found out about loss of her sister, due to cardiac arrest, in [place]. She will be out of town this week return 05/20/17.  She is eager to pursue this testing. She started ASA 81mg  daily now, and would like to pursue both Carotid Doppler and TEE as recommended. She has fam history mother with carotid plaque required surgery.  Will place referral to Cardiology for follow-up on this and TEE to review results for embolic work-up, and additionally will check to see if they can also perform Carotid Doppler as well, and she may not need vascular surgery consult, unless there is a problem identified with carotids.  Nobie Putnam, [facility] 05/15/2017, 5:43 PM

## 2017-05-16 NOTE — Telephone Encounter (Signed)
[place] and spoke with Anderson Malta and confirmed that Cardiology would order and perform both the Carotid Dopplers (in office) and the TEE (in hospital) and discuss results with patient for the embolic work-up.  Soonest available was actually a cancellation with Dr Fletcher Anon tomorrow, but patient will be out of state unfortunately, next soonest may be July 2019.  I have added patient to the call list for cancellation appointments.  Referred placed in Epic, they will contact her and schedule her to be seen and order both tests. Given new diagnosis for her, I would prefer that she sit down with Cardiology to review these upcoming tests and more of what to expect before proceeding.  Nobie Putnam, DO [facility] 05/16/2017, 8:30 AM

## 2017-05-16 NOTE — Addendum Note (Signed)
Addended by: Olin Hauser on: 05/16/2017 08:32 AM   Modules accepted: Orders

## 2017-05-20 ENCOUNTER — Ambulatory Visit: Payer: Medicare HMO | Admitting: Pain Medicine

## 2017-05-20 ENCOUNTER — Ambulatory Visit: Payer: Medicare HMO | Admitting: Cardiovascular Disease

## 2017-05-20 ENCOUNTER — Encounter: Payer: Self-pay | Admitting: Cardiovascular Disease

## 2017-05-20 VITALS — BP 110/75 | HR 71 | Ht 62.0 in | Wt 170.0 lb

## 2017-05-20 DIAGNOSIS — E782 Mixed hyperlipidemia: Secondary | ICD-10-CM

## 2017-05-20 DIAGNOSIS — Z8249 Family history of ischemic heart disease and other diseases of the circulatory system: Secondary | ICD-10-CM

## 2017-05-20 DIAGNOSIS — H34211 Partial retinal artery occlusion, right eye: Secondary | ICD-10-CM | POA: Diagnosis not present

## 2017-05-20 DIAGNOSIS — F17200 Nicotine dependence, unspecified, uncomplicated: Secondary | ICD-10-CM | POA: Diagnosis not present

## 2017-05-20 DIAGNOSIS — R0989 Other specified symptoms and signs involving the circulatory and respiratory systems: Secondary | ICD-10-CM

## 2017-05-20 DIAGNOSIS — R69 Illness, unspecified: Secondary | ICD-10-CM | POA: Diagnosis not present

## 2017-05-20 NOTE — Patient Instructions (Addendum)
Medication Instructions:   No medication changes made  Labwork:  No new labs needed  Testing/Procedures:  1) We will check a carotid ultrasound - we will call you to schedule  2) CT coronary calcium score ($150 out of pocket) - please call [facility] at [phone] to schedule - this is located [address], [place] (3rd floor)  3) We may need a TEE (transesophageal echo)   Follow-Up: It was a pleasure seeing you in the office today. Please call us if you have new issues that need to be addressed before your next appt.  [phone]  Your physician wants you to follow-up in:  We will call you with the results above  If you need a refill on your cardiac medications before your next appointment, please call your pharmacy.  For educational health videos Log in to : [email or web address] Or : [email or web address], password : [email or web address]

## 2017-05-20 NOTE — Progress Notes (Signed)
Cardiology Office Note  Date:  05/20/2017   ID:  Ashley, Fields [birth year], MRN [id number]  PCP:  Olin Hauser, DO   Chief Complaint  Patient presents with  . OTHER    Pt needing embolic work up ; Discuss Hollenhorst plaque Dx and diagnostic testing. Med reviewed verbally with pt.    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, concern for possible vascular or embolic etiology  She reports that she has been asymptomatic, Recent cataract surgery, vision now better Above finding was found on routine exam  Denies any shortness of breath on exertion Active at baseline Remote smoking history for at least 15 years  Recently treated aggressively for her cholesterol No numbers available but she feels her cholesterol is 180   in terms of her family history loss of her sister recently, due to cardiac arrest father died 104 MI Sister 90 died cardiac arrest  EKG personally reviewed by myself on todays visit Shows normal sinus rhythm rate 71 bpm no significant ST or T-wave changes  PMH:   has a past medical history of Allergy, Anemia, Chronic kidney disease, Depression, Disp fx of cuboid bone of right foot, init for clos fx (01/01/2017), GERD (gastroesophageal reflux disease), Headache, Hepatitis C, Hyperlipidemia, Osteoporosis, Thyroid disease, and Urine incontinence.  PSH:    Past Surgical History:  Procedure Laterality Date  . 5 miscarriages     1977-1985, Blood transfusion s/p miscarriage 1977  . ABDOMINAL HYSTERECTOMY  1987   Total  . APPENDECTOMY  12/2008  . AUGMENTATION MAMMAPLASTY Bilateral 2015   Bilat  . AUGMENTATION MAMMAPLASTY Bilateral 2018   implants redone w/ placement of implants under muscle  . breast lift  bilateral, implants  48/54/6270   bilateral, silicon naturel  . BREAST REDUCTION SURGERY Bilateral 1997  . CESAREAN SECTION  07/27/1981   Placenta Previa  . CHOLECYSTECTOMY  03/2004   Lap surgery  . GASTRIC BYPASS  2005   Laparoscopic  . IVC FILTER INSERTION  12/2003   TrapEase Vena Cava Filter  . LUMBAR LAMINECTOMY  06/2006   L4-L5 (spinal fusion)  . mini tummy tuck  05/07/2016   Bilateral bra/back roll lift skin removal  . neck surgery C3-C7  02/10/2015   ACDF  . REDUCTION MAMMAPLASTY Bilateral 1997  . SHOULDER ACROMIOPLASTY Left 03/27/2013   w/ labral debridement  . SPINAL CORD STIMULATOR IMPLANT  11/2010   removed 05/2011    Current Outpatient Medications  Medication Sig Dispense Refill  . aspirin EC 81 MG tablet Take 1 tablet (81 mg total) by mouth daily.    . Biotin 10000 MCG TABS Take 10,000 mcg by mouth 1 day or 1 dose.    . butalbital-acetaminophen-caffeine (FIORICET, ESGIC) 50-325-40 MG tablet TAKE 1 TABLET BY MOUTH EVERY NIGHT AS NEEDED FOR HEADACHE  2  . cholecalciferol (VITAMIN D) 1000 units tablet Take 1,000 Units by mouth daily.    Marland Kitchen estradiol (ESTRACE) 1 MG tablet Take 1 mg by mouth daily.    Marland Kitchen etodolac (LODINE) 500 MG tablet Take 1 tablet (500 mg total) by mouth 2 (two) times daily. 60 tablet 1  . ferrous sulfate 325 (65 FE) MG tablet Take 325 mg by mouth daily.    . fluticasone (FLONASE) 50 MCG/ACT nasal spray 1  SPRAY IN EACH NOSTRIL ONCE A DAY NASALLY 30 DAYS as needed  3  . gabapentin (NEURONTIN) 800 MG tablet Take 1 tablet (800 mg total) by mouth QID. 360 tablet 0  . LORazepam (ATIVAN) 0.5 MG tablet Take 1 tablet (0.5 mg total) by mouth 2 (two) times daily as needed for anxiety. 45 tablet 2  . Magnesium 500 MG CAPS Take 1 capsule (500 mg total) by mouth 2 (two) times daily at 8 am and 10 pm. 60 capsule 5  . Melatonin 10 MG CAPS Take 1 capsule by mouth at bedtime.    . Multiple Vitamin (MULTIVITAMIN WITH MINERALS) TABS tablet Take 1 tablet by mouth daily.     Marland Kitchen omeprazole (PRILOSEC) 40 MG capsule Take 40 mg by mouth daily.  0  . ondansetron (ZOFRAN ODT) 4 MG disintegrating tablet Take 1 tablet (4 mg total) by mouth every 8 (eight) hours as needed for nausea or vomiting. 30 tablet 0  . sertraline (ZOLOFT) 100 MG tablet Take 1 tablet (100 mg total) by mouth 2 (two) times daily. 180 tablet 1  . simvastatin (ZOCOR) 20 MG tablet TAKE 1 TABLET IN THE EVENING ONCE A DAY ORALLY 90 DAYS  0  . tiZANidine (ZANAFLEX) 4 MG tablet Take 1 tablet (4 mg total) by mouth 3 (three) times daily. 90 tablet 1  . traMADol (ULTRAM) 50 MG tablet Take 1 tablet (50 mg total) by mouth every 6 (six) hours as needed for severe pain. 120 tablet 1  . traZODone (DESYREL) 150 MG tablet Take 150 mg by mouth at bedtime.  0  . Turmeric 500 MG CAPS Take 2 capsules by mouth daily.    . vitamin B-12 (CYANOCOBALAMIN) 500 MCG tablet Take 500 mcg by mouth daily.    . vitamin C (ASCORBIC ACID) 500 MG tablet Take 500 mg 2 (two) times daily by mouth.    . vitamin E 100 UNIT capsule Take by mouth daily.     No current facility-administered medications for this visit.      Allergies:   Flagyl [metronidazole] and Tape   Social History:  The patient  reports that she has quit smoking. She quit after 4.00 years of use. She has never used smokeless tobacco. She reports that she does not drink alcohol or use drugs.   Family History:   family history includes COPD in her mother; Colon polyps in her sister; Depression in her sister; Diabetes in her brother and mother; Heart attack in her brother, mother, and sister; Heart attack (age of onset: 22) in her father; Heart disease in her brother, father, and mother; Lung cancer in her mother; Stroke in her mother.    Review of Systems: Review of Systems  Constitutional: Negative.   Respiratory: Negative.   Cardiovascular: Negative.   Gastrointestinal: Negative.   Musculoskeletal: Negative.   Neurological: Negative.   Psychiatric/Behavioral:  Negative.   All other systems reviewed and are negative.    PHYSICAL EXAM: VS:  BP 110/75 (BP Location: Right Arm, Patient Position: Sitting, Cuff Size: Large)   Pulse 71   Ht 5\' 2"  (1.575 m)   Wt 170 lb (77.1 kg)   BMI 31.09 kg/m  , BMI Body mass index is 31.09 kg/m. GEN: Well nourished, well developed, in no acute distress  HEENT: normal  Neck: no JVD, carotid bruits, or masses Cardiac: RRR; no murmurs, rubs, or gallops,no edema  Respiratory:  clear to auscultation bilaterally, normal work of breathing GI: soft, nontender, nondistended, + BS MS: no  deformity or atrophy  Skin: warm and dry, no rash Neuro:  Strength and sensation are intact Psych: euthymic mood, full affect    Recent Labs: 08/14/2016: ALT 10; BUN 17; Creatinine, Ser 0.80; Magnesium 2.3; Potassium 4.1; Sodium 139    Lipid Panel No results found for: CHOL, HDL, LDLCALC, TRIG    Wt Readings from Last 3 Encounters:  05/20/17 170 lb (77.1 kg)  05/09/17 167 lb (75.8 kg)  04/25/17 170 lb (77.1 kg)       ASSESSMENT AND PLAN:  Hollenhorst plaque, right eye - Plan: VAS US CAROTID Etiology unclear, concerning for embolic phenomenon We have requested carotid ultrasound CT coronary calcium score ordered to evaluate aorta for plaque Later date could consider echo/TEE if clinically indicated.   Family history of coronary artery disease - Plan: CT CARDIAC SCORING CT coronary calcium score is above given father's sister have died from MI  Carotid bruit, unspecified laterality - Plan: VAS US CAROTID Carotid ultrasound ordered as above, also in light of plaque in her eye  Mixed hyperlipidemia Depending on carotid and calcium score may need to push cholesterol lower with LDL less than 70 Most recent lipid panel Available  Smoker  Reports that she quit over 20 years ago Risk factor for atherosclerosis   Total encounter time more than 60 minutes  Greater than 50% was spent in counseling and coordination of  care with the patient  Disposition:   We will call with the results above to arrange follow-up   Orders Placed This Encounter  Procedures  . CT CARDIAC SCORING     Signed, Esmond Plants, M.D., Ph.D. 05/20/2017  [place], [place]

## 2017-05-21 NOTE — Addendum Note (Signed)
Addended by: Britt Bottom on: 05/21/2017 04:18 PM   Modules accepted: Orders

## 2017-05-21 NOTE — Progress Notes (Deleted)
Patient's Name: Ashley Fields  MRN: [id number]  Referring Provider: Nobie Putnam *  DOB: 05/12/1955  PCP: Olin Hauser, DO  DOS: 05/22/2017  Note by: Gaspar Cola, MD  Service setting: Ambulatory outpatient  Specialty: Interventional Pain Management  Location: [facility] (AMB) Pain Management Facility    Patient type: Established   Primary Reason(s) for Visit: Encounter for post-procedure evaluation of chronic illness with mild to moderate exacerbation CC: No chief complaint on file.  HPI  [name] is a 62 y.o. year old, female patient, who comes today for a post-procedure evaluation. She has Chronic pain syndrome; Chronic neck pain ; Chronic hip pain (Secondary Area of Pain) (Bilateral) (L>R); Long term current use of opiate analgesic; Long term prescription opiate use; Opiate use; Chronic knee pain (Left); Osteoarthritis of hip (Bilateral); Chronic Low back pain (Primary Area of Pain) (Bilateral)  (R>L); Chronic lower extremity pain (Tertiary Area of Pain) (Bilateral) (L>R); Long term prescription benzodiazepine use; Grade 1 Anterolisthesis of L3 over L4; Failed back surgical syndrome; Chronic shoulder pain (Left); History of cervical fusion (ACDF C4-C7); DDD (degenerative disc disease), cervical; Chronic sacroiliac joint pain (Bilateral) (R>L); Lumbar Facet Syndrome (Bilateral) (R>L); Lumbar facet osteoarthritis (Bilateral); DDD (degenerative disc disease), lumbar; History of hepatitis C; Hepatic steatosis; History of nephrolithiasis; Lumbar spinal stenosis (severe) (L3-4); Lumbar lateral recess stenosis (Bilateral) (L3-4); Lumbar foraminal stenosis (L3-4) (Left); PTSD (post-traumatic stress disorder); GERD (gastroesophageal reflux disease); Coccygodynia; Hollenhorst plaque, right eye; Family history of coronary artery disease; Carotid bruit; Mixed hyperlipidemia; and Smoker on their problem list. Her primarily concern today is the No chief complaint on file.  Pain  Assessment: Location:     Radiating:   Onset:   Duration:   Quality:   Severity:  /10 (subjective, self-reported pain score)  Note: Reported level is compatible with observation.                         When using our objective Pain Scale, levels between 6 and 10/10 are said to belong in an emergency room, as it progressively worsens from a 6/10, described as severely limiting, requiring emergency care not usually available at an outpatient pain management facility. At a 6/10 level, communication becomes difficult and requires great effort. Assistance to reach the emergency department may be required. Facial flushing and profuse sweating along with potentially dangerous increases in heart rate and blood pressure will be evident. Effect on ADL:   Timing:   Modifying factors:   BP:    HR:    [name] comes in today for post-procedure evaluation after the treatment done on 05/09/2017.  Further details on both, my assessment(s), as well as the proposed treatment plan, please see below.  Post-Procedure Assessment  04/25/2017 Procedure: Diagnostic midline caudal epidural steroid injection #1 under fluoroscopic guidance and IV sedation Pre-procedure pain score:  3/10 Post-procedure pain score: 0/10 (100% relief) Influential Factors: BMI:   Intra-procedural challenges: None observed.         Assessment challenges: None detected.              Reported side-effects: None.        Post-procedural adverse reactions or complications: None reported         Sedation: No sedation used. When no sedatives are used, the analgesic levels obtained are directly associated to the effectiveness of the local anesthetics. However, when sedation is provided, the level of analgesia obtained during the initial 1 hour following the intervention, is believed  to be the result of a combination of factors. These factors may include, but are not limited to: 1. The effectiveness of the local anesthetics used. 2. The  effects of the analgesic(s) and/or anxiolytic(s) used. 3. The degree of discomfort experienced by the patient at the time of the procedure. 4. The patients ability and reliability in recalling and recording the events. 5. The presence and influence of possible secondary gains and/or psychosocial factors. Reported result: Relief experienced during the 1st hour after the procedure:   (Ultra-Short Term Relief)            Interpretative annotation: Clinically appropriate result. No IV Analgesic or Anxiolytic given, therefore benefits are completely due to Local Anesthetic effects.          Effects of local anesthetic: The analgesic effects attained during this period are directly associated to the localized infiltration of local anesthetics and therefore cary significant diagnostic value as to the etiological location, or anatomical origin, of the pain. Expected duration of relief is directly dependent on the pharmacodynamics of the local anesthetic used. Long-acting (4-6 hours) anesthetics used.  Reported result: Relief during the next 4 to 6 hour after the procedure:   (Short-Term Relief)            Interpretative annotation: Clinically appropriate result. Analgesia during this period is likely to be Local Anesthetic-related.          Long-term benefit: Defined as the period of time past the expected duration of local anesthetics (1 hour for short-acting and 4-6 hours for long-acting). With the possible exception of prolonged sympathetic blockade from the local anesthetics, benefits during this period are typically attributed to, or associated with, other factors such as analgesic sensory neuropraxia, antiinflammatory effects, or beneficial biochemical changes provided by agents other than the local anesthetics.  Reported result: Extended relief following procedure:   (Long-Term Relief)            Interpretative annotation: Clinically appropriate result. Good relief. No permanent benefit expected.  Inflammation plays a part in the etiology to the pain.          Current benefits: Defined as reported results that persistent at this point in time.   Analgesia: *** %            Function: Somewhat improved ROM: Somewhat improved Interpretative annotation: Recurrence of symptoms. No permanent benefit expected. Effective diagnostic intervention.          Interpretation: Results would suggest a successful diagnostic intervention.                  Plan:  Please see "Plan of Care" for details.                Laboratory Chemistry  Inflammation Markers (CRP: Acute Phase) (ESR: Chronic Phase) Lab Results  Component Value Date   CRP 0.7 08/14/2016   ESRSEDRATE 9 08/14/2016                         Renal Function Markers Lab Results  Component Value Date   BUN 17 08/14/2016   CREATININE 0.80 08/14/2016   GFRAA 93 08/14/2016   GFRNONAA 80 08/14/2016                              Hepatic Function Markers Lab Results  Component Value Date   AST 20 08/14/2016   ALT 10 08/14/2016   ALBUMIN 4.9 (  H) 08/14/2016   ALKPHOS 103 08/14/2016                        Electrolytes Lab Results  Component Value Date   NA 139 08/14/2016   K 4.1 08/14/2016   CL 97 08/14/2016   CALCIUM 9.6 08/14/2016   MG 2.3 08/14/2016                        Neuropathy Markers Lab Results  Component Value Date   VITAMINB12 473 08/14/2016                        Bone Pathology Markers Lab Results  Component Value Date   25OHVITD1 47 08/14/2016   25OHVITD2 <1.0 08/14/2016   25OHVITD3 47 08/14/2016                         Note: Lab results reviewed.  Recent Diagnostic Imaging Results  MM DIGITAL SCREENING W/ IMPLANTS BILATERAL CLINICAL DATA:  Screening.  EXAM: DIGITAL SCREENING BILATERAL MAMMOGRAM WITH IMPLANTS AND CAD  The patient has retropectoral implants. Standard and implant displaced views were performed.  COMPARISON:  Previous exam(s).  ACR Breast Density Category a: The breast tissue  is almost entirely fatty.  FINDINGS: There are no findings suspicious for malignancy. Images were processed with CAD.  IMPRESSION: No mammographic evidence of malignancy. A result letter of this screening mammogram will be mailed directly to the patient.  RECOMMENDATION: Screening mammogram in one year. (Code:[id number]-B-01Y)  BI-RADS CATEGORY  1:  Negative.  Electronically Signed   By: Franki Cabot M.D.   On: 05/13/2017 16:00 MM Outside Films Mammo This examination belongs to an outside facility and is stored here for  comparison purposes only.  Contact the originating outside institution for  any associated report or interpretation.  Complexity Note: I personally reviewed the fluoroscopic imaging of the procedure.                        Meds   Current Outpatient Medications:  .  aspirin EC 81 MG tablet, Take 1 tablet (81 mg total) by mouth daily., Disp: , Rfl:  .  Biotin 10000 MCG TABS, Take 10,000 mcg by mouth 1 day or 1 dose., Disp: , Rfl:  .  butalbital-acetaminophen-caffeine (FIORICET, ESGIC) 50-325-40 MG tablet, TAKE 1 TABLET BY MOUTH EVERY NIGHT AS NEEDED FOR HEADACHE, Disp: , Rfl: 2 .  cholecalciferol (VITAMIN D) 1000 units tablet, Take 1,000 Units by mouth daily., Disp: , Rfl:  .  estradiol (ESTRACE) 1 MG tablet, Take 1 mg by mouth daily., Disp: , Rfl:  .  etodolac (LODINE) 500 MG tablet, Take 1 tablet (500 mg total) by mouth 2 (two) times daily., Disp: 60 tablet, Rfl: 1 .  ferrous sulfate 325 (65 FE) MG tablet, Take 325 mg by mouth daily., Disp: , Rfl:  .  fluticasone (FLONASE) 50 MCG/ACT nasal spray, 1 SPRAY IN EACH NOSTRIL ONCE A DAY NASALLY 30 DAYS as needed, Disp: , Rfl: 3 .  gabapentin (NEURONTIN) 800 MG tablet, Take 1 tablet (800 mg total) by mouth QID., Disp: 360 tablet, Rfl: 0 .  LORazepam (ATIVAN) 0.5 MG tablet, Take 1 tablet (0.5 mg total) by mouth 2 (two) times daily as needed for anxiety., Disp: 45 tablet, Rfl: 2 .  Magnesium 500 MG CAPS, Take 1 capsule (500 mg  total) by  mouth 2 (two) times daily at 8 am and 10 pm., Disp: 60 capsule, Rfl: 5 .  Melatonin 10 MG CAPS, Take 1 capsule by mouth at bedtime., Disp: , Rfl:  .  Multiple Vitamin (MULTIVITAMIN WITH MINERALS) TABS tablet, Take 1 tablet by mouth daily., Disp: , Rfl:  .  omeprazole (PRILOSEC) 40 MG capsule, Take 40 mg by mouth daily., Disp: , Rfl: 0 .  ondansetron (ZOFRAN ODT) 4 MG disintegrating tablet, Take 1 tablet (4 mg total) by mouth every 8 (eight) hours as needed for nausea or vomiting., Disp: 30 tablet, Rfl: 0 .  sertraline (ZOLOFT) 100 MG tablet, Take 1 tablet (100 mg total) by mouth 2 (two) times daily., Disp: 180 tablet, Rfl: 1 .  simvastatin (ZOCOR) 20 MG tablet, TAKE 1 TABLET IN THE EVENING ONCE A DAY ORALLY 90 DAYS, Disp: , Rfl: 0 .  tiZANidine (ZANAFLEX) 4 MG tablet, Take 1 tablet (4 mg total) by mouth 3 (three) times daily., Disp: 90 tablet, Rfl: 1 .  traMADol (ULTRAM) 50 MG tablet, Take 1 tablet (50 mg total) by mouth every 6 (six) hours as needed for severe pain., Disp: 120 tablet, Rfl: 1 .  traZODone (DESYREL) 150 MG tablet, Take 150 mg by mouth at bedtime., Disp: , Rfl: 0 .  Turmeric 500 MG CAPS, Take 2 capsules by mouth daily., Disp: , Rfl:  .  vitamin B-12 (CYANOCOBALAMIN) 500 MCG tablet, Take 500 mcg by mouth daily., Disp: , Rfl:  .  vitamin C (ASCORBIC ACID) 500 MG tablet, Take 500 mg 2 (two) times daily by mouth., Disp: , Rfl:  .  vitamin E 100 UNIT capsule, Take by mouth daily., Disp: , Rfl:   ROS  Constitutional: Denies any fever or chills Gastrointestinal: No reported hemesis, hematochezia, vomiting, or acute GI distress Musculoskeletal: Denies any acute onset joint swelling, redness, loss of ROM, or weakness Neurological: No reported episodes of acute onset apraxia, aphasia, dysarthria, agnosia, amnesia, paralysis, loss of coordination, or loss of consciousness  Allergies  [name] is allergic to flagyl [metronidazole] and tape.  PFSH  Drug: [name]  reports  that she does not use drugs. Alcohol:  reports that she does not drink alcohol. Tobacco:  reports that she has quit smoking. She quit after 4.00 years of use. She has never used smokeless tobacco. Medical:  has a past medical history of Allergy, Anemia, Chronic kidney disease, Depression, Disp fx of cuboid bone of right foot, init for clos fx (01/01/2017), GERD (gastroesophageal reflux disease), Headache, Hepatitis C, Hyperlipidemia, Osteoporosis, Thyroid disease, and Urine incontinence. Surgical: [name]  has a past surgical history that includes Cesarean section (07/27/1981); Abdominal hysterectomy (1987); Breast reduction surgery (Bilateral, 1997); Cholecystectomy (03/2004); Appendectomy (12/2008); Gastric bypass (2005); mini tummy tuck (05/07/2016); breast lift bilateral, implants (05/18/2015); Lumbar laminectomy (06/2006); 5 miscarriages; neck surgery C3-C7 (02/10/2015); IVC FILTER INSERTION (12/2003); Spinal cord stimulator implant (11/2010); Shoulder acromioplasty (Left, 03/27/2013); Reduction mammaplasty (Bilateral, 1997); Augmentation mammaplasty (Bilateral, 2015); and Augmentation mammaplasty (Bilateral, 2018). Family: family history includes COPD in her mother; Colon polyps in her sister; Depression in her sister; Diabetes in her brother and mother; Heart attack in her brother, mother, and sister; Heart attack (age of onset: 73) in her father; Heart disease in her brother, father, and mother; Lung cancer in her mother; Stroke in her mother.  Constitutional Exam  General appearance: Well nourished, well developed, and well hydrated. In no apparent acute distress There were no vitals filed for this visit. BMI Assessment: Estimated body  mass index is 31.09 kg/m as calculated from the following:   Height as of 05/20/17: 5' 2"  (1.575 m).   Weight as of 05/20/17: 170 lb (77.1 kg).  BMI interpretation table: BMI level Category Range association with higher incidence of chronic pain  <18 kg/m2  Underweight   18.5-24.9 kg/m2 Ideal body weight   25-29.9 kg/m2 Overweight Increased incidence by 20%  30-34.9 kg/m2 Obese (Class I) Increased incidence by 68%  35-39.9 kg/m2 Severe obesity (Class II) Increased incidence by 136%  >40 kg/m2 Extreme obesity (Class III) Increased incidence by 254%   Patient's current BMI Ideal Body weight  There is no height or weight on file to calculate BMI. Ideal body weight: 50.1 kg (110 lb 7.2 oz) Adjusted ideal body weight: 60.9 kg (134 lb 4.3 oz)   BMI Readings from Last 4 Encounters:  05/20/17 31.09 kg/m  05/09/17 30.54 kg/m  04/25/17 31.09 kg/m  04/17/17 31.09 kg/m   Wt Readings from Last 4 Encounters:  05/20/17 170 lb (77.1 kg)  05/09/17 167 lb (75.8 kg)  04/25/17 170 lb (77.1 kg)  04/17/17 170 lb (77.1 kg)  Psych/Mental status: Alert, oriented x 3 (person, place, & time)       Eyes: PERLA Respiratory: No evidence of acute respiratory distress  Cervical Spine Area Exam  Skin & Axial Inspection: No masses, redness, edema, swelling, or associated skin lesions Alignment: Symmetrical Functional ROM: Unrestricted ROM      Stability: No instability detected Muscle Tone/Strength: Functionally intact. No obvious neuro-muscular anomalies detected. Sensory (Neurological): Unimpaired Palpation: No palpable anomalies              Upper Extremity (UE) Exam    Side: Right upper extremity  Side: Left upper extremity  Skin & Extremity Inspection: Skin color, temperature, and hair growth are WNL. No peripheral edema or cyanosis. No masses, redness, swelling, asymmetry, or associated skin lesions. No contractures.  Skin & Extremity Inspection: Skin color, temperature, and hair growth are WNL. No peripheral edema or cyanosis. No masses, redness, swelling, asymmetry, or associated skin lesions. No contractures.  Functional ROM: Unrestricted ROM          Functional ROM: Unrestricted ROM          Muscle Tone/Strength: Functionally intact. No obvious  neuro-muscular anomalies detected.  Muscle Tone/Strength: Functionally intact. No obvious neuro-muscular anomalies detected.  Sensory (Neurological): Unimpaired          Sensory (Neurological): Unimpaired          Palpation: No palpable anomalies              Palpation: No palpable anomalies              Specialized Test(s): Deferred         Specialized Test(s): Deferred          Thoracic Spine Area Exam  Skin & Axial Inspection: No masses, redness, or swelling Alignment: Symmetrical Functional ROM: Unrestricted ROM Stability: No instability detected Muscle Tone/Strength: Functionally intact. No obvious neuro-muscular anomalies detected. Sensory (Neurological): Unimpaired Muscle strength & Tone: No palpable anomalies  Lumbar Spine Area Exam  Skin & Axial Inspection: No masses, redness, or swelling Alignment: Symmetrical Functional ROM: Unrestricted ROM       Stability: No instability detected Muscle Tone/Strength: Functionally intact. No obvious neuro-muscular anomalies detected. Sensory (Neurological): Unimpaired Palpation: No palpable anomalies       Provocative Tests: Lumbar Hyperextension and rotation test: evaluation deferred today       Lumbar Lateral bending  test: evaluation deferred today       Patrick's Maneuver: evaluation deferred today                    Gait & Posture Assessment  Ambulation: Unassisted Gait: Relatively normal for age and body habitus Posture: WNL   Lower Extremity Exam    Side: Right lower extremity  Side: Left lower extremity  Stability: No instability observed          Stability: No instability observed          Skin & Extremity Inspection: Skin color, temperature, and hair growth are WNL. No peripheral edema or cyanosis. No masses, redness, swelling, asymmetry, or associated skin lesions. No contractures.  Skin & Extremity Inspection: Skin color, temperature, and hair growth are WNL. No peripheral edema or cyanosis. No masses, redness, swelling,  asymmetry, or associated skin lesions. No contractures.  Functional ROM: Unrestricted ROM                  Functional ROM: Unrestricted ROM                  Muscle Tone/Strength: Functionally intact. No obvious neuro-muscular anomalies detected.  Muscle Tone/Strength: Functionally intact. No obvious neuro-muscular anomalies detected.  Sensory (Neurological): Unimpaired  Sensory (Neurological): Unimpaired  Palpation: No palpable anomalies  Palpation: No palpable anomalies   Assessment  Primary Diagnosis & Pertinent Problem List: The primary encounter diagnosis was Failed back surgical syndrome. Diagnoses of DDD (degenerative disc disease), lumbar, Chronic Low back pain (Primary Area of Pain) (Bilateral)  (R>L), Chronic hip pain (Secondary Area of Pain) (Bilateral) (L>R), Chronic lower extremity pain (Tertiary Area of Pain) (Bilateral) (L>R), Grade 1 Anterolisthesis of L3 over L4, Lumbar Facet Syndrome (Bilateral) (R>L), and Lumbar facet osteoarthritis (Bilateral) were also pertinent to this visit.  Status Diagnosis  Controlled Controlled Controlled 1. Failed back surgical syndrome   2. DDD (degenerative disc disease), lumbar   3. Chronic Low back pain (Primary Area of Pain) (Bilateral)  (R>L)   4. Chronic hip pain (Secondary Area of Pain) (Bilateral) (L>R)   5. Chronic lower extremity pain (Tertiary Area of Pain) (Bilateral) (L>R)   6. Grade 1 Anterolisthesis of L3 over L4   7. Lumbar Facet Syndrome (Bilateral) (R>L)   8. Lumbar facet osteoarthritis (Bilateral)     Problems updated and reviewed during this visit: No problems updated. Plan of Care  Pharmacotherapy (Medications Ordered): No orders of the defined types were placed in this encounter.  Medications administered today: Delaila Nand had no medications administered during this visit.  Procedure Orders    No procedure(s) ordered today   Lab Orders  No laboratory test(s) ordered today   Imaging Orders  No imaging studies  ordered today   Referral Orders  No referral(s) requested today    Interventional management options: Planned, scheduled, and/or pending:   ***   Considering:   Diagnosticbilateral L3-4 transforaminal LESI  Diagnostic bilateral lumbar facet block  Possiblebilateral lumbar facetRFA   Diagnostic bilateral sacroiliac joint block  Possible bilateral sacroiliac joint RFA  Diagnosticleft hip injection  Diagnosticleft femoral nerve + obturator nerve block   Possible left femoral nerve + obturator nerve RFA  Diagnostic left knee intra-articular steroid injection  Possible series of 5 left intra-articular Hyalgan knee injection  Diagnosticleft genicular nerve block  Possible left genicular nerve RFA  Diagnosticcervical epidural steroid injection  Diagnosticbilateral cervical facet block  Possiblebilateral cervical RFA    Palliative PRN treatment(s):   None at  this time   Provider-requested follow-up: No follow-ups on file.  Future Appointments  Date Time Provider [place]  05/22/2017  9:15 AM Milinda Pointer, MD [facility] None  07/05/2017 11:00 AM Olin Hauser, DO [place] None  07/15/2017  9:30 AM Vevelyn Francois, NP [facility] None   Primary Care Physician: Olin Hauser, DO Location: [facility] Note by: Gaspar Cola, MD Date: 05/22/2017; Time: 4:50 PM

## 2017-05-22 ENCOUNTER — Ambulatory Visit: Payer: Medicare HMO | Admitting: Pain Medicine

## 2017-05-22 ENCOUNTER — Encounter: Payer: Self-pay | Admitting: Cardiovascular Disease

## 2017-05-23 ENCOUNTER — Ambulatory Visit (INDEPENDENT_AMBULATORY_CARE_PROVIDER_SITE_OTHER): Payer: Medicare HMO

## 2017-05-23 DIAGNOSIS — R0989 Other specified symptoms and signs involving the circulatory and respiratory systems: Secondary | ICD-10-CM

## 2017-05-23 DIAGNOSIS — H34211 Partial retinal artery occlusion, right eye: Secondary | ICD-10-CM | POA: Diagnosis not present

## 2017-06-03 DIAGNOSIS — H26491 Other secondary cataract, right eye: Secondary | ICD-10-CM | POA: Diagnosis not present

## 2017-06-06 ENCOUNTER — Ambulatory Visit (INDEPENDENT_AMBULATORY_CARE_PROVIDER_SITE_OTHER)
Admission: RE | Admit: 2017-06-06 | Discharge: 2017-06-06 | Disposition: A | Discharge status: Home / Self Care | Payer: Self-pay | Source: Ambulatory Visit | Attending: Cardiovascular Disease | Admitting: Cardiovascular Disease

## 2017-06-06 DIAGNOSIS — Z8249 Family history of ischemic heart disease and other diseases of the circulatory system: Secondary | ICD-10-CM

## 2017-06-07 ENCOUNTER — Encounter: Payer: Self-pay | Admitting: Cardiovascular Disease

## 2017-06-09 ENCOUNTER — Encounter: Payer: Self-pay | Admitting: Cardiovascular Disease

## 2017-06-10 ENCOUNTER — Encounter (INDEPENDENT_AMBULATORY_CARE_PROVIDER_SITE_OTHER): Payer: Self-pay

## 2017-06-10 ENCOUNTER — Encounter: Payer: Self-pay | Admitting: Cardiovascular Disease

## 2017-06-10 ENCOUNTER — Telehealth: Payer: Self-pay | Admitting: Cardiovascular Disease

## 2017-06-10 ENCOUNTER — Encounter: Payer: Self-pay | Admitting: Family Medicine

## 2017-06-10 ENCOUNTER — Ambulatory Visit: Payer: Medicare HMO | Admitting: Cardiovascular Disease

## 2017-06-10 ENCOUNTER — Ambulatory Visit (INDEPENDENT_AMBULATORY_CARE_PROVIDER_SITE_OTHER): Payer: Medicare HMO | Admitting: Family Medicine

## 2017-06-10 VITALS — BP 98/70 | HR 62 | Ht 62.0 in | Wt 172.2 lb

## 2017-06-10 VITALS — BP 124/66 | HR 70 | Temp 98.3°F | Resp 16 | Ht 62.0 in | Wt 173.0 lb

## 2017-06-10 DIAGNOSIS — Z8249 Family history of ischemic heart disease and other diseases of the circulatory system: Secondary | ICD-10-CM

## 2017-06-10 DIAGNOSIS — H26492 Other secondary cataract, left eye: Secondary | ICD-10-CM | POA: Diagnosis not present

## 2017-06-10 DIAGNOSIS — H16102 Unspecified superficial keratitis, left eye: Secondary | ICD-10-CM | POA: Diagnosis not present

## 2017-06-10 DIAGNOSIS — F4310 Post-traumatic stress disorder, unspecified: Secondary | ICD-10-CM

## 2017-06-10 DIAGNOSIS — R0602 Shortness of breath: Secondary | ICD-10-CM | POA: Diagnosis not present

## 2017-06-10 DIAGNOSIS — F4329 Adjustment disorder with other symptoms: Secondary | ICD-10-CM

## 2017-06-10 DIAGNOSIS — R69 Illness, unspecified: Secondary | ICD-10-CM | POA: Diagnosis not present

## 2017-06-10 DIAGNOSIS — H34211 Partial retinal artery occlusion, right eye: Secondary | ICD-10-CM | POA: Diagnosis not present

## 2017-06-10 DIAGNOSIS — G894 Chronic pain syndrome: Secondary | ICD-10-CM | POA: Diagnosis not present

## 2017-06-10 DIAGNOSIS — F4321 Adjustment disorder with depressed mood: Secondary | ICD-10-CM

## 2017-06-10 DIAGNOSIS — F4381 Prolonged grief disorder: Secondary | ICD-10-CM

## 2017-06-10 MED ORDER — ROSUVASTATIN CALCIUM 40 MG PO TABS: 40.0000 mg | ORAL_TABLET | Freq: Every day | ORAL | 3 refills | Status: DC

## 2017-06-10 MED ORDER — EZETIMIBE 10 MG PO TABS: 10.0000 mg | ORAL_TABLET | Freq: Every day | ORAL | 3 refills | Status: DC

## 2017-06-10 MED ORDER — ARIPIPRAZOLE 5 MG PO TABS: 5.0000 mg | ORAL_TABLET | Freq: Every day | ORAL | 2 refills | Status: DC

## 2017-06-10 NOTE — Telephone Encounter (Signed)
Patient sent mychart msg to scheduling for an appt reason:    CT results that i've read, and it scares me. It says they suggest other test, cardiac cath. I just need something to calm me down and not thinking I'm going to drop dead at any second.    Please advise if a ov should be scheduled or she needs to speak with Triage

## 2017-06-10 NOTE — Progress Notes (Signed)
Cardiology Office Note  Date:  06/10/2017   ID:  Chantrice, Hagg 1955-02-15, MRN [id number]  PCP:  Olin Hauser, DO   Chief Complaint  Patient presents with  . OTHER    Discuss CT results. Meds reviewed verbally wiht pt.    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,  Vascular disease, follow-up of her CT coronary calcium score Calcium score 2400  Gets tired at times but overall not bad Has 12 acres, Just tired, no chest pain Some shortness of breath Walks with a cane limited by back pain  Remote smoking history for at least 15 years Reports her total cholesterol is 180  CT chest discussed with her calcium score of 2400 Also aortic atherosclerosis noted "Coronary arteries: Marked LM and 3 vessel coronary calcification" Ascending Aorta: Calcific aortic atherosclerosis normal diameter 3.4 cm  minimal carotid disease bilaterally less than 39% bilaterally  Long discussion again in terms of her family history loss of her sister recently, due to cardiac arrest father died 25 MI Sister 102 died cardiac arrest  EKG personally reviewed by myself on todays visit Shows normal sinus rhythm rate 62 bpm no significant ST or T-wave changes No change from previous EKG  PMH:   has a past medical history of Allergy, Anemia, Chronic kidney disease, Depression, Disp fx of cuboid bone of right foot, init for clos fx (01/01/2017), GERD (gastroesophageal reflux disease), Headache, Hepatitis C, Hyperlipidemia, Osteoporosis, Thyroid disease, and Urine incontinence.  PSH:    Past Surgical History:  Procedure Laterality Date  . 5 miscarriages     1977-1985, Blood transfusion s/p miscarriage 1977  . ABDOMINAL HYSTERECTOMY  1987   Total  . APPENDECTOMY   12/2008  . AUGMENTATION MAMMAPLASTY Bilateral 2015   Bilat  . AUGMENTATION MAMMAPLASTY Bilateral 2018   implants redone w/ placement of implants under muscle  . breast lift bilateral, implants  94/76/5465   bilateral, silicon naturel  . BREAST REDUCTION SURGERY Bilateral 1997  . CESAREAN SECTION  07/27/1981   Placenta Previa  . CHOLECYSTECTOMY  03/2004   Lap surgery  . GASTRIC BYPASS  2005   Laparoscopic  . IVC FILTER INSERTION  12/2003   TrapEase Vena Cava Filter  . LUMBAR LAMINECTOMY  06/2006   L4-L5 (spinal fusion)  . mini tummy tuck  05/07/2016   Bilateral bra/back roll lift skin removal  . neck surgery C3-C7  02/10/2015   ACDF  . REDUCTION MAMMAPLASTY Bilateral 1997  . SHOULDER ACROMIOPLASTY Left 03/27/2013   w/ labral debridement  . SPINAL CORD STIMULATOR IMPLANT  11/2010   removed 05/2011    Current Outpatient Medications  Medication Sig Dispense Refill  . Difluprednate (DUREZOL) 0.05 % EMUL Place into the left eye daily.    . ARIPiprazole (ABILIFY) 5 MG tablet Take 1 tablet (5 mg total) by mouth daily. 30 tablet 2  . aspirin EC 81 MG tablet Take 1 tablet (81 mg total) by mouth daily.    . Biotin 10000 MCG TABS Take 10,000 mcg by mouth 1 day or 1 dose.    . butalbital-acetaminophen-caffeine (FIORICET, ESGIC) 50-325-40 MG tablet TAKE 1 TABLET BY MOUTH EVERY NIGHT AS NEEDED FOR HEADACHE  2  . cholecalciferol (VITAMIN D) 1000 units tablet Take 1,000 Units by mouth daily.    Marland Kitchen  estradiol (ESTRACE) 1 MG tablet Take 1 mg by mouth daily.    Marland Kitchen etodolac (LODINE) 500 MG tablet Take 1 tablet (500 mg total) by mouth 2 (two) times daily. 60 tablet 1  . ferrous sulfate 325 (65 FE) MG tablet Take 325 mg by mouth daily.    . fluticasone (FLONASE) 50 MCG/ACT nasal spray 1 SPRAY IN EACH NOSTRIL ONCE A DAY NASALLY 30 DAYS as needed  3  . gabapentin (NEURONTIN) 800 MG tablet Take 1 tablet (800 mg total) by mouth QID. 360 tablet 0  . LORazepam (ATIVAN) 0.5 MG tablet Take 1 tablet (0.5 mg  total) by mouth 2 (two) times daily as needed for anxiety. 45 tablet 2  . Magnesium 500 MG CAPS Take 1 capsule (500 mg total) by mouth 2 (two) times daily at 8 am and 10 pm. 60 capsule 5  . Melatonin 10 MG CAPS Take 1 capsule by mouth at bedtime.    . Multiple Vitamin (MULTIVITAMIN WITH MINERALS) TABS tablet Take 1 tablet by mouth daily.    Marland Kitchen omeprazole (PRILOSEC) 40 MG capsule Take 40 mg by mouth daily.  0  . ondansetron (ZOFRAN ODT) 4 MG disintegrating tablet Take 1 tablet (4 mg total) by mouth every 8 (eight) hours as needed for nausea or vomiting. 30 tablet 0  . sertraline (ZOLOFT) 100 MG tablet Take 1 tablet (100 mg total) by mouth 2 (two) times daily. 180 tablet 1  . simvastatin (ZOCOR) 20 MG tablet TAKE 1 TABLET IN THE EVENING ONCE A DAY ORALLY 90 DAYS  0  . tiZANidine (ZANAFLEX) 4 MG tablet Take 1 tablet (4 mg total) by mouth 3 (three) times daily. 90 tablet 1  . traMADol (ULTRAM) 50 MG tablet Take 1 tablet (50 mg total) by mouth every 6 (six) hours as needed for severe pain. (Patient taking differently: Take 50 mg by mouth every 4 (four) hours as needed for severe pain. ) 120 tablet 1  . traZODone (DESYREL) 150 MG tablet Take 150 mg by mouth at bedtime.  0  . Turmeric 500 MG CAPS Take 2 capsules by mouth daily.    . vitamin B-12 (CYANOCOBALAMIN) 500 MCG tablet Take 500 mcg by mouth daily.    . vitamin E 100 UNIT capsule Take by mouth daily.     No current facility-administered medications for this visit.      Allergies:   Flagyl [metronidazole] and Tape   Social History:  The patient  reports that she has quit smoking. She quit after 4.00 years of use. She has never used smokeless tobacco. She reports that she does not drink alcohol or use drugs.   Family History:   family history includes COPD in her mother; Colon polyps in her sister; Depression in her sister; Diabetes in her brother and mother; Heart attack in her brother, mother, and sister; Heart attack (age of onset: 51) in her  father; Heart disease in her brother, father, and mother; Lung cancer in her mother; Stroke in her mother.    Review of Systems: Review of Systems  Constitutional: Negative.   Respiratory: Negative.   Cardiovascular: Negative.   Gastrointestinal: Negative.   Musculoskeletal: Negative.   Neurological: Negative.   Psychiatric/Behavioral: Negative.   All other systems reviewed and are negative.    PHYSICAL EXAM: VS:  BP 98/70 (BP Location: Left Arm, Patient Position: Sitting, Cuff Size: Normal)   Pulse 62   Ht 5\' 2"  (1.575 m)   Wt 172 lb 4 oz (78.1 kg)  BMI 31.50 kg/m  , BMI Body mass index is 31.5 kg/m. Constitutional:  oriented to person, place, and time. No distress.  HENT:  Head: Normocephalic and atraumatic.  Eyes:  no discharge. No scleral icterus.  Neck: Normal range of motion. Neck supple. No JVD present.  Cardiovascular: Normal rate, regular rhythm, normal heart sounds and intact distal pulses. Exam reveals no gallop and no friction rub. No edema No murmur heard. Pulmonary/Chest: Effort normal and breath sounds normal. No stridor. No respiratory distress.  no wheezes.  no rales.  no tenderness.  Abdominal: Soft.  no distension.  no tenderness.  Musculoskeletal: Normal range of motion.  no  tenderness or deformity.  Neurological:  normal muscle tone. Coordination normal. No atrophy Skin: Skin is warm and dry. No rash noted. not diaphoretic.  Psychiatric:  normal mood and affect. behavior is normal. Thought content normal.   Recent Labs: 08/14/2016: ALT 10; BUN 17; Creatinine, Ser 0.80; Magnesium 2.3; Potassium 4.1; Sodium 139    Lipid Panel No results found for: CHOL, HDL, LDLCALC, TRIG    Wt Readings from Last 3 Encounters:  06/10/17 172 lb 4 oz (78.1 kg)  06/10/17 173 lb (78.5 kg)  05/20/17 170 lb (77.1 kg)      ASSESSMENT AND PLAN:  Hollenhorst plaque, right eye -  Carotid with minimal bilateral disease She does have significant aortic atherosclerosis  likely explainingher pathology Stressed importance of aggressive lipid management  CAD Seen on CT scan Calcium score 2400 Recommended she stop her simvastatin start Crestor 40 with Zetia 10 Goal LDL less than 70 -shortness of breath symptoms at times unable to treadmill, we have recommended pharmacological Myoview  Mixed hyperlipidemia Medication changes as above  Smoker  Reports that she quit over 20 years ago Risk factor for atherosclerosis   Total encounter time more than 45 minutes  Greater than 50% was spent in counseling and coordination of care with the patient  Disposition:    Follow-up in 6 months     Orders Placed This Encounter  Procedures  . EKG 12-Lead     Signed, Esmond Plants, M.D., Ph.D. 06/10/2017  [place], [place]

## 2017-06-10 NOTE — Patient Instructions (Addendum)
Thank you for coming to the office today.  Continue Sertraline 100mg  twice daily, continue Trazodone 150mg  nightly and melatonin  Add Abilify 5mg  daily - check with pharmacist if safe to cut in half - if you feel like 5mg  is too strong  Likely short term only 3 to 6 months  If need additional counseling please consider these options  [facility] [place], [place] [address] Ph [phone] [email or web address]  --------------------------------------------------------------------------- Self referral  [facility]) [place] [address] [place], [place] [address] Phone: [phone]  [facility], available walk-in 9am-4pm M-F [place],  [address] Hours: [place] (M-F, walk in available) [phone]  Please schedule a Follow-up Appointment to: Return in about 4 weeks (around 07/05/2017).  If you have any other questions or concerns, please feel free to call the office or send a message through Iatan. You may also schedule an earlier appointment if necessary.  Additionally, you may be receiving a survey about your experience at our office within a few days to 1 week by e-mail or mail. We value your feedback.  Nobie Putnam, DO [place]

## 2017-06-10 NOTE — Telephone Encounter (Signed)
Patient sent a second msg through mychart:  Questions about reports, suggested other treatment such as cardiac cath, medication, I'm scared my sister who was 88 just died Jun 11, 2017 of a cardiac arrest. Please help me.

## 2017-06-10 NOTE — Telephone Encounter (Signed)
Patient confirmed appointment for today with provider to review her results.

## 2017-06-10 NOTE — Telephone Encounter (Signed)
No answer. Left message to call back.  We have opening this afternoon with Dr Rockey Situ if patient can come in.

## 2017-06-10 NOTE — Progress Notes (Signed)
Subjective:    Patient ID: Ashley Fields, female    DOB: 08/02/1955, 62 y.o.   MRN: [id number]  Ashley Fields is a 62 y.o. female presenting on 06/10/2017 for Depression   HPI   Acute Adjustment Disorder w/ Depressed mood with Porlonged Grief Reaction and History of PTSD / Anxiety - Reports chronic history of mental health issue with PTSD and anxiety, previously managed by prior PCP in [place] for years on Diazepam for PRN anxiety. She was transitioned to Lorazepam by previous PCP locally, and now is currently low on this rx. It is not rx by pain management. Other [facility] continued Zoloft, Trazodone, Lorazepam PRN. And they could see her back in future if needed. - Today she reports that mood is worse and she is dealing with significant depression following loss of her sister "Ashley Fields, nickname" who lived in [place]. She describes long history with close relationship with her sister and she was always there to help her when needed and vice versa, her sister's son passed away early and Ashley Fields was there to help with her, and now she has some guilt with move to [facility] and not being close to her sister for past >10 months. - Now she is dealing with flare up of depression among other symptoms, it is related to difficulty coping with loss of her sister, she feels like she cannot reach out to anyone - even though she is trying to talk to family members siblings and they help some she is not able to really move on. She has plans to get small tattoo with one sister in rememberence of her passed sister. She has not been able to really talk to her husband Ashley Fields about this much and she "pushes him away" as she always has with others during stressful times. - She is no longer followed by Psychiatry, she used to be on various meds in past, has failed many meds including Paxil in past, Cymbalta/Duloxetine, Celexa, Lexapro,  Wellbutrin - Additions to her Sertraline that helped were Abilify in past then switched to [place] - however this was not covered and cost was high. - Regarding sleep, still has insomniabut now doing better on Trazodone and Melatonin Admits some insomnia, agitation, crying spells fatigue Denies suicidal or homicidal ideation  Depression screen [facility] 2/9 06/10/2017 05/09/2017 04/25/2017  Decreased Interest 2 0 0  Down, Depressed, Hopeless 3 0 0  PHQ - 2 Score 5 0 0  Altered sleeping 2 - -  Tired, decreased energy 3 - -  Change in appetite 3 - -  Feeling bad or failure about yourself  0 - -  Trouble concentrating 2 - -  Moving slowly or fidgety/restless 1 - -  Suicidal thoughts 0 - -  PHQ-9 Score 16 - -  Difficult doing work/chores Somewhat difficult - -  Some recent data might be hidden   GAD 7 : Generalized Anxiety Score 06/10/2017  Nervous, Anxious, on Edge 3  Control/stop worrying 2  Worry too much - different things 2  Trouble relaxing 2  Restless 1  Easily annoyed or irritable 2  Afraid - awful might happen 0  Total GAD 7 Score 12  Anxiety Difficulty Somewhat difficult    Social History   Tobacco Use  . Smoking status: Former Smoker    Years: 4.00  . Smokeless tobacco: Never Used  Substance Use Topics  . Alcohol use: No  . Drug use: No  Review of Systems Per HPI unless specifically indicated above     Objective:    BP 124/66   Pulse 70   Temp 98.3 F (36.8 C) (Oral)   Resp 16   Ht 5\' 2"  (1.575 m)   Wt 173 lb (78.5 kg)   BMI 31.64 kg/m   Wt Readings from Last 3 Encounters:  06/10/17 173 lb (78.5 kg)  05/20/17 170 lb (77.1 kg)  05/09/17 167 lb (75.8 kg)    Physical Exam  Constitutional: She is oriented to person, place, and time. She appears well-developed and well-nourished. No distress.  Well-appearing, comfortable, cooperative  HENT:  Head: Normocephalic and atraumatic.  Mouth/Throat: Oropharynx is clear and moist.  Eyes: Conjunctivae are normal.  Right eye exhibits no discharge. Left eye exhibits no discharge.  Cardiovascular: Normal rate.  Pulmonary/Chest: Effort normal.  Musculoskeletal: She exhibits no edema.  Neurological: She is alert and oriented to person, place, and time.  Skin: Skin is warm and dry. No rash noted. She is not diaphoretic. No erythema.  Psychiatric: Her behavior is normal.  Well groomed, good eye contact, normal speech and thoughts. Mood is depressed with some brief crying spell at times, tearful. Congruent to affect and consistent with recent acute life stressor. No other abnormal behavior or perceptions. Does not appear anxious.  Nursing note and vitals reviewed.     Assessment & Plan:   Problem List Items Addressed This Visit    PTSD (post-traumatic stress disorder)   Relevant Medications   ARIPiprazole (ABILIFY) 5 MG tablet    Other Visit Diagnoses    Acute adjustment disorder with depressed mood    -  Primary   Relevant Medications   ARIPiprazole (ABILIFY) 5 MG tablet   Prolonged grief reaction       Relevant Medications   ARIPiprazole (ABILIFY) 5 MG tablet      Concern with acute on chronic depression, in setting of complex mental health history with PTSD, Anxiety, Insomnia, multiple failed treatments in past. Clinically seems only difficulty coping with loss of loved on within past 1 month. - Does not appear risk to safety  Plan Strongly recommended counseling or therapy in addition to meds - given this is patient's chief complaint, she agrees to consider Hospice Grief Counseling, she will call to check on this. Also she can return to [facility] as needed in future self referral has been there for pain management eval in past recently. - she declines at this time for Psychiatry - Agree to add back therapy with Abilify 5mg  daily - may consider half tab if able to cut it - likely 3-6 months only - limited by cost/coverage - CONTINUE her current Sertraline, declined to increase  Sertraline off label use up to 300mg , will keep current dose at 200mg  (100mg  BID), and Trazodone 150mg  nightly for insomnia - Continue Ativan now, avoid increasing or adjusting dose since helpful and may not benefit current symptoms as much - Follow-up within 1 month re-eval, return criteria given  Meds ordered this encounter  Medications  . ARIPiprazole (ABILIFY) 5 MG tablet    Sig: Take 1 tablet (5 mg total) by mouth daily.    Dispense:  30 tablet    Refill:  2    Follow up plan: Return in about 4 weeks (around 07/05/2017).  Did not order future labs at this time, will defer to Cardiology following her for atherosclerosis, may need lipid. Otherwise next visit is follow-up not due for Annual Physical.  Sheppard Coil  Parks Ranger, [facility] 06/10/2017, 12:55 PM

## 2017-06-10 NOTE — Telephone Encounter (Signed)
Left voicemail message to call back about appointment and also sent mychart message.

## 2017-06-10 NOTE — Patient Instructions (Addendum)
Medication Instructions:  Your physician has recommended you make the following change in your medication:  1. STOP Simvastatin 2. START Crestor 40 mg once daily 3. START Zetia 10 mg once daily   Labwork:  Your physician recommends that you return for lab work in: 3 months recheck fasting Lipids and LFTs. Make sure not to eat or drink after midnight prior except sip of water with pills.    Testing/Procedures:  We will schedule a lexiscan myoview Severe plaque/coronary calcification [facility] MYOVIEW  Your caregiver has ordered a Stress Test with nuclear imaging. The purpose of this test is to evaluate the blood supply to your heart muscle. This procedure is referred to as a "Non-Invasive Stress Test." This is because other than having an IV started in your vein, nothing is inserted or "invades" your body. Cardiac stress tests are done to find areas of poor blood flow to the heart by determining the extent of coronary artery disease (CAD). Some patients exercise on a treadmill, which naturally increases the blood flow to your heart, while others who are  unable to walk on a treadmill due to physical limitations have a pharmacologic/chemical stress agent called Lexiscan . This medicine will mimic walking on a treadmill by temporarily increasing your coronary blood flow.   Please note: these test may take anywhere between 2-4 hours to complete  PLEASE REPORT TO [facility] [place] AT THE FIRST DESK WILL DIRECT YOU WHERE TO GO  Date of Procedure:_____________________________________  Arrival Time for Procedure:______________________________   PLEASE NOTIFY THE OFFICE AT LEAST 24 HOURS IN ADVANCE IF YOU ARE UNABLE TO KEEP YOUR APPOINTMENT.  [phone] AND  PLEASE NOTIFY NUCLEAR MEDICINE AT [facility] AT LEAST 24 HOURS IN ADVANCE IF YOU ARE UNABLE TO KEEP YOUR APPOINTMENT. [phone]  How to prepare for your Myoview test:  1. Do not eat or drink after midnight 2. No  caffeine for 24 hours prior to test 3. No smoking 24 hours prior to test. 4. Your medication may be taken with water.  If your doctor stopped a medication because of this test, do not take that medication. 5. Ladies, please do not wear dresses.  Skirts or pants are appropriate. Please wear a short sleeve shirt. 6. No perfume, cologne or lotion. 7. Wear comfortable walking shoes. No heels!   Follow-Up: It was a pleasure seeing you in the office today. Please call us if you have new issues that need to be addressed before your next appt.  [phone]  Your physician wants you to follow-up in: 6 months.  You will receive a reminder letter in the mail two months in advance. If you don't receive a letter, please call our office to schedule the follow-up appointment.  If you need a refill on your cardiac medications before your next appointment, please call your pharmacy.  For educational health videos Log in to : [email or web address] Or : [email or web address], password : [email or web address]

## 2017-06-13 ENCOUNTER — Encounter
Admission: RE | Admit: 2017-06-13 | Discharge: 2017-06-13 | Disposition: A | Discharge status: Home / Self Care | Payer: Medicare HMO | Source: Ambulatory Visit | Attending: Cardiovascular Disease | Admitting: Cardiovascular Disease

## 2017-06-13 DIAGNOSIS — G894 Chronic pain syndrome: Secondary | ICD-10-CM | POA: Diagnosis not present

## 2017-06-13 DIAGNOSIS — R0602 Shortness of breath: Secondary | ICD-10-CM | POA: Diagnosis not present

## 2017-06-13 MED ORDER — REGADENOSON 0.4 MG/5ML IV SOLN
0.4000 mg | Freq: Once | INTRAVENOUS | Status: AC
  Administered 2017-06-13: 0.4 mg via INTRAVENOUS

## 2017-06-13 MED ORDER — TECHNETIUM TC 99M TETROFOSMIN IV KIT
32.3200 | PACK | Freq: Once | INTRAVENOUS | Status: AC | PRN
  Administered 2017-06-13: 32.32 via INTRAVENOUS

## 2017-06-13 MED ORDER — TECHNETIUM TC 99M TETROFOSMIN IV KIT
14.0200 | PACK | Freq: Once | INTRAVENOUS | Status: AC | PRN
  Administered 2017-06-13: 14.02 via INTRAVENOUS

## 2017-06-14 ENCOUNTER — Telehealth: Payer: Self-pay | Admitting: Cardiovascular Disease

## 2017-06-14 ENCOUNTER — Encounter: Payer: Self-pay | Admitting: Cardiovascular Disease

## 2017-06-14 LAB — NM MYOCAR MULTI W/SPECT W/WALL MOTION / EF
CHL CUP NUCLEAR SRS: 10
CHL CUP NUCLEAR SSS: 0
CHL CUP RESTING HR STRESS: 64 {beats}/min
LV dias vol: 64 mL (ref 46–106)
LV sys vol: 26 mL
Peak HR: 96 {beats}/min
Percent HR: 60 %
SDS: 0
TID: 0.91

## 2017-06-14 NOTE — Telephone Encounter (Signed)
Spoke with patient and let her know that I would call as soon as results are available. She verbalized understanding with no further questions at this time.

## 2017-06-14 NOTE — Telephone Encounter (Signed)
Patient calling to check status of stress test results  Please call to discuss

## 2017-06-17 ENCOUNTER — Other Ambulatory Visit: Payer: Self-pay | Admitting: Family Medicine

## 2017-06-17 ENCOUNTER — Other Ambulatory Visit: Payer: Self-pay

## 2017-06-17 ENCOUNTER — Encounter: Payer: Self-pay | Admitting: Pain Medicine

## 2017-06-17 ENCOUNTER — Ambulatory Visit: Payer: Medicare HMO | Attending: Pain Medicine | Admitting: Pain Medicine

## 2017-06-17 VITALS — BP 113/67 | HR 73 | Temp 98.2°F | Resp 18 | Ht 62.0 in | Wt 167.0 lb

## 2017-06-17 DIAGNOSIS — E782 Mixed hyperlipidemia: Secondary | ICD-10-CM | POA: Insufficient documentation

## 2017-06-17 DIAGNOSIS — R69 Illness, unspecified: Secondary | ICD-10-CM | POA: Diagnosis not present

## 2017-06-17 DIAGNOSIS — Z79899 Other long term (current) drug therapy: Secondary | ICD-10-CM | POA: Insufficient documentation

## 2017-06-17 DIAGNOSIS — M48061 Spinal stenosis, lumbar region without neurogenic claudication: Secondary | ICD-10-CM | POA: Insufficient documentation

## 2017-06-17 DIAGNOSIS — M5388 Other specified dorsopathies, sacral and sacrococcygeal region: Secondary | ICD-10-CM | POA: Diagnosis not present

## 2017-06-17 DIAGNOSIS — M961 Postlaminectomy syndrome, not elsewhere classified: Secondary | ICD-10-CM | POA: Diagnosis not present

## 2017-06-17 DIAGNOSIS — M5442 Lumbago with sciatica, left side: Secondary | ICD-10-CM | POA: Diagnosis not present

## 2017-06-17 DIAGNOSIS — F329 Major depressive disorder, single episode, unspecified: Secondary | ICD-10-CM | POA: Insufficient documentation

## 2017-06-17 DIAGNOSIS — G894 Chronic pain syndrome: Secondary | ICD-10-CM | POA: Insufficient documentation

## 2017-06-17 DIAGNOSIS — Z8249 Family history of ischemic heart disease and other diseases of the circulatory system: Secondary | ICD-10-CM | POA: Insufficient documentation

## 2017-06-17 DIAGNOSIS — M5136 Other intervertebral disc degeneration, lumbar region: Secondary | ICD-10-CM | POA: Insufficient documentation

## 2017-06-17 DIAGNOSIS — Z7982 Long term (current) use of aspirin: Secondary | ICD-10-CM | POA: Insufficient documentation

## 2017-06-17 DIAGNOSIS — Z888 Allergy status to other drugs, medicaments and biological substances status: Secondary | ICD-10-CM | POA: Diagnosis not present

## 2017-06-17 DIAGNOSIS — R110 Nausea: Secondary | ICD-10-CM

## 2017-06-17 DIAGNOSIS — Z79891 Long term (current) use of opiate analgesic: Secondary | ICD-10-CM | POA: Insufficient documentation

## 2017-06-17 DIAGNOSIS — M79604 Pain in right leg: Secondary | ICD-10-CM

## 2017-06-17 DIAGNOSIS — Z981 Arthrodesis status: Secondary | ICD-10-CM | POA: Diagnosis not present

## 2017-06-17 DIAGNOSIS — M5441 Lumbago with sciatica, right side: Secondary | ICD-10-CM

## 2017-06-17 DIAGNOSIS — G8929 Other chronic pain: Secondary | ICD-10-CM | POA: Diagnosis not present

## 2017-06-17 DIAGNOSIS — Z881 Allergy status to other antibiotic agents status: Secondary | ICD-10-CM | POA: Insufficient documentation

## 2017-06-17 DIAGNOSIS — Z87891 Personal history of nicotine dependence: Secondary | ICD-10-CM | POA: Insufficient documentation

## 2017-06-17 DIAGNOSIS — M792 Neuralgia and neuritis, unspecified: Secondary | ICD-10-CM | POA: Diagnosis not present

## 2017-06-17 DIAGNOSIS — K219 Gastro-esophageal reflux disease without esophagitis: Secondary | ICD-10-CM | POA: Diagnosis not present

## 2017-06-17 DIAGNOSIS — M25559 Pain in unspecified hip: Secondary | ICD-10-CM | POA: Diagnosis not present

## 2017-06-17 DIAGNOSIS — M160 Bilateral primary osteoarthritis of hip: Secondary | ICD-10-CM | POA: Diagnosis not present

## 2017-06-17 DIAGNOSIS — Z90710 Acquired absence of both cervix and uterus: Secondary | ICD-10-CM | POA: Insufficient documentation

## 2017-06-17 DIAGNOSIS — Z9884 Bariatric surgery status: Secondary | ICD-10-CM | POA: Diagnosis not present

## 2017-06-17 DIAGNOSIS — M533 Sacrococcygeal disorders, not elsewhere classified: Secondary | ICD-10-CM

## 2017-06-17 DIAGNOSIS — M79605 Pain in left leg: Secondary | ICD-10-CM | POA: Diagnosis not present

## 2017-06-17 MED ORDER — GABAPENTIN 800 MG PO TABS: 800.0000 mg | ORAL_TABLET | Freq: Three times a day (TID) | ORAL | 0 refills | Status: DC

## 2017-06-17 NOTE — Telephone Encounter (Signed)
Called patient. Let her know preliminary result of stress test as read by Dr Rockey Situ: "Pharmacological myocardial perfusion imaging study with no significant  ischemia Normal wall motion, EF estimated at 83% No EKG changes concerning for ischemia at peak stress or in recovery. Low risk scan   Signed, Esmond Plants, MD, Ph.D [facility]"  She verbalized understanding that these are preliminary and will need Dr Donivan Scull final review.

## 2017-06-17 NOTE — Patient Instructions (Addendum)
____________________________________________________________________________________________  Pain Scale  Introduction: The pain score used by this practice is the Verbal Numerical Rating Scale (VNRS-11). This is an 11-point scale. It is for adults and children 10 years or older. There are significant differences in how the pain score is reported, used, and applied. Forget everything you learned in the past and learn this scoring system.  General Information: The scale should reflect your current level of pain. Unless you are specifically asked for the level of your worst pain, or your average pain. If you are asked for one of these two, then it should be understood that it is over the past 24 hours.  Basic Activities of Daily Living (ADL): Personal hygiene, dressing, eating, transferring, and using restroom.  Instructions: Most patients tend to report their level of pain as a combination of two factors, their physical pain and their psychosocial pain. This last one is also known as "suffering" and it is reflection of how physical pain affects you socially and psychologically. From now on, report them separately. From this point on, when asked to report your pain level, report only your physical pain. Use the following table for reference.  Pain Clinic Pain Levels (0-5/10)  Pain Level Score  Description  No Pain 0   Mild pain 1 Nagging, annoying, but does not interfere with basic activities of daily living (ADL). Patients are able to eat, bathe, get dressed, toileting (being able to get on and off the toilet and perform personal hygiene functions), transfer (move in and out of bed or a chair without assistance), and maintain continence (able to control bladder and bowel functions). Blood pressure and heart rate are unaffected. A normal heart rate for a healthy adult ranges from 60 to 100 bpm (beats per minute).   Mild to moderate pain 2 Noticeable and distracting. Impossible to hide from other  people. More frequent flare-ups. Still possible to adapt and function close to normal. It can be very annoying and may have occasional stronger flare-ups. With discipline, patients may get used to it and adapt.   Moderate pain 3 Interferes significantly with activities of daily living (ADL). It becomes difficult to feed, bathe, get dressed, get on and off the toilet or to perform personal hygiene functions. Difficult to get in and out of bed or a chair without assistance. Very distracting. With effort, it can be ignored when deeply involved in activities.   Moderately severe pain 4 Impossible to ignore for more than a few minutes. With effort, patients may still be able to manage work or participate in some social activities. Very difficult to concentrate. Signs of autonomic nervous system discharge are evident: dilated pupils (mydriasis); mild sweating (diaphoresis); sleep interference. Heart rate becomes elevated (>115 bpm). Diastolic blood pressure (lower number) rises above 100 mmHg. Patients find relief in laying down and not moving.   Severe pain 5 Intense and extremely unpleasant. Associated with frowning face and frequent crying. Pain overwhelms the senses.  Ability to do any activity or maintain social relationships becomes significantly limited. Conversation becomes difficult. Pacing back and forth is common, as getting into a comfortable position is nearly impossible. Pain wakes you up from deep sleep. Physical signs will be obvious: pupillary dilation; increased sweating; goosebumps; brisk reflexes; cold, clammy hands and feet; nausea, vomiting or dry heaves; loss of appetite; significant sleep disturbance with inability to fall asleep or to remain asleep. When persistent, significant weight loss is observed due to the complete loss of appetite and sleep deprivation.  Blood   pressure and heart rate becomes significantly elevated. Caution: If elevated blood pressure triggers a pounding headache  associated with blurred vision, then the patient should immediately seek attention at an urgent or emergency care unit, as these may be signs of an impending stroke.    Emergency Department Pain Levels (6-10/10)  Emergency Room Pain 6 Severely limiting. Requires emergency care and should not be seen or managed at an outpatient pain management facility. Communication becomes difficult and requires great effort. Assistance to reach the emergency department may be required. Facial flushing and profuse sweating along with potentially dangerous increases in heart rate and blood pressure will be evident.   Distressing pain 7 Self-care is very difficult. Assistance is required to transport, or use restroom. Assistance to reach the emergency department will be required. Tasks requiring coordination, such as bathing and getting dressed become very difficult.   Disabling pain 8 Self-care is no longer possible. At this level, pain is disabling. The individual is unable to do even the most "basic" activities such as walking, eating, bathing, dressing, transferring to a bed, or toileting. Fine motor skills are lost. It is difficult to think clearly.   Incapacitating pain 9 Pain becomes incapacitating. Thought processing is no longer possible. Difficult to remember your own name. Control of movement and coordination are lost.   The worst pain imaginable 10 At this level, most patients pass out from pain. When this level is reached, collapse of the autonomic nervous system occurs, leading to a sudden drop in blood pressure and heart rate. This in turn results in a temporary and dramatic drop in blood flow to the brain, leading to a loss of consciousness. Fainting is one of the body's self defense mechanisms. Passing out puts the brain in a calmed state and causes it to shut down for a while, in order to begin the healing process.    Summary: 1. Refer to this scale when providing us with your pain level. 2. Be  accurate and careful when reporting your pain level. This will help with your care. 3. Over-reporting your pain level will lead to loss of credibility. 4. Even a level of 1/10 means that there is pain and will be treated at our facility. 5. High, inaccurate reporting will be documented as "Symptom Exaggeration", leading to loss of credibility and suspicions of possible secondary gains such as obtaining more narcotics, or wanting to appear disabled, for fraudulent reasons. 6. Only pain levels of 5 or below will be seen at our facility. 7. Pain levels of 6 and above will be sent to the Emergency Department and the appointment cancelled. ____________________________________________________________________________________________   ____________________________________________________________________________________________  Preparing for Procedure with Sedation  Instructions: . Oral Intake: Do not eat or drink anything for at least 8 hours prior to your procedure. . Transportation: Public transportation is not allowed. Bring an adult driver. The driver must be physically present in our waiting room before any procedure can be started. . Physical Assistance: Bring an adult physically capable of assisting you, in the event you need help. This adult should keep you company at home for at least 6 hours after the procedure. . Blood Pressure Medicine: Take your blood pressure medicine with a sip of water the morning of the procedure. . Blood thinners:  . Diabetics on insulin: Notify the staff so that you can be scheduled 1st case in the morning. If your diabetes requires high dose insulin, take only  of your normal insulin dose the morning of the procedure and   notify the staff that you have done so. . Preventing infections: Shower with an antibacterial soap the morning of your procedure. . Build-up your immune system: Take 1000 mg of Vitamin C with every meal (3 times a day) the day prior to your  procedure. [facility] Antibiotics: Inform the staff if you have a condition or reason that requires you to take antibiotics before dental procedures. . Pregnancy: If you are pregnant, call and cancel the procedure. . Sickness: If you have a cold, fever, or any active infections, call and cancel the procedure. . Arrival: You must be in the facility at least 30 minutes prior to your scheduled procedure. . Children: Do not bring children with you. . Dress appropriately: Bring dark clothing that you would not mind if they get stained. . Valuables: Do not bring any jewelry or valuables.  Procedure appointments are reserved for interventional treatments only. [facility] No Prescription Refills. . No medication changes will be discussed during procedure appointments. . No disability issues will be discussed.  Remember:  Regular Business hours are:  Monday to Thursday 8:00 AM to 4:00 PM  Provider's Schedule: Milinda Pointer, MD:  Procedure days: Tuesday and Thursday 7:30 AM to 4:00 PM  Gillis Santa, MD:  Procedure days: Monday and Wednesday 7:30 AM to 4:00 PM ____________________________________________________________________________________________   ____________________________________________________________________________________________  Preparing for Procedure with Sedation  Instructions: . Oral Intake: Do not eat or drink anything for at least 8 hours prior to your procedure. . Transportation: Public transportation is not allowed. Bring an adult driver. The driver must be physically present in our waiting room before any procedure can be started. [facility] Physical Assistance: Bring an adult physically capable of assisting you, in the event you need help. This adult should keep you company at home for at least 6 hours after the procedure. . Blood Pressure Medicine: Take your blood pressure medicine with a sip of water the morning of the procedure. . Blood thinners:  . Diabetics on insulin: Notify the  staff so that you can be scheduled 1st case in the morning. If your diabetes requires high dose insulin, take only  of your normal insulin dose the morning of the procedure and notify the staff that you have done so. . Preventing infections: Shower with an antibacterial soap the morning of your procedure. . Build-up your immune system: Take 1000 mg of Vitamin C with every meal (3 times a day) the day prior to your procedure. [facility] Kitchen Antibiotics: Inform the staff if you have a condition or reason that requires you to take antibiotics before dental procedures. . Pregnancy: If you are pregnant, call and cancel the procedure. . Sickness: If you have a cold, fever, or any active infections, call and cancel the procedure. . Arrival: You must be in the facility at least 30 minutes prior to your scheduled procedure. . Children: Do not bring children with you. . Dress appropriately: Bring dark clothing that you would not mind if they get stained. . Valuables: Do not bring any jewelry or valuables.  Procedure appointments are reserved for interventional treatments only. [facility] No Prescription Refills. . No medication changes will be discussed during procedure appointments. . No disability issues will be discussed.  Remember:  Regular Business hours are:  Monday to Thursday 8:00 AM to 4:00 PM  Provider's Schedule: Milinda Pointer, MD:  Procedure days: Tuesday and Thursday 7:30 AM to 4:00 PM  Gillis Santa, MD:  Procedure days: Monday and Wednesday 7:30 AM to 4:00 PM ____________________________________________________________________________________________

## 2017-06-17 NOTE — Progress Notes (Signed)
Nursing Pain Medication Assessment:  Safety precautions to be maintained throughout the outpatient stay will include: orient to surroundings, keep bed in low position, maintain call bell within reach at all times, provide assistance with transfer out of bed and ambulation.  Medication Inspection Compliance: [name] did not comply with our request to bring her pills to be counted. She was reminded that bringing the medication bottles, even when empty, is a requirement.  Medication: None brought in. Pill/Patch Count: None available to be counted. Bottle Appearance: No container available. Did not bring bottle(s) to appointment. Filled Date: N/A Last Medication intake:  Today

## 2017-06-17 NOTE — Telephone Encounter (Signed)
Patient came by office Checking on status of stress test results Please call when available

## 2017-06-17 NOTE — Progress Notes (Signed)
Patient's Name: Ashley Fields  MRN: [id number]  Referring Provider: Nobie Putnam *  DOB: [birth year]  PCP: Olin Hauser, DO  DOS: 06/17/2017  Note by: Gaspar Cola, MD  Service setting: Ambulatory outpatient  Specialty: Interventional Pain Management  Location: [facility] (AMB) Pain Management Facility    Patient type: Established   Primary Reason(s) for Visit: Encounter for post-procedure evaluation of chronic illness with mild to moderate exacerbation CC: Back Pain (low and left) and Leg Pain (left anterior to knee)  HPI  Ashley Fields is a 62 y.o. year old, female patient, who comes today for a post-procedure evaluation. She has Chronic pain syndrome; Chronic neck pain ; Chronic hip pain (Secondary Area of Pain) (Bilateral) (L>R); Long term current use of opiate analgesic; Long term prescription opiate use; Opiate use; Chronic knee pain (Left); Osteoarthritis of hip (Bilateral); Chronic Low back pain (Primary Area of Pain) (Bilateral)  (R>L); Chronic lower extremity pain (Tertiary Area of Pain) (Bilateral) (L>R); Long term prescription benzodiazepine use; Grade 1 Anterolisthesis of L3 over L4; Failed back surgical syndrome; Chronic shoulder pain (Left); History of cervical fusion (ACDF C4-C7); DDD (degenerative disc disease), cervical; Chronic sacroiliac joint pain (Bilateral) (R>L); Lumbar Facet Syndrome (Bilateral) (R>L); Lumbar facet osteoarthritis (Bilateral); DDD (degenerative disc disease), lumbar; History of hepatitis C; Hepatic steatosis; History of nephrolithiasis; Lumbar spinal stenosis (severe) (L3-4); Lumbar lateral recess stenosis (Bilateral) (L3-4); Lumbar foraminal stenosis (L3-4) (Left); PTSD (post-traumatic stress disorder); GERD (gastroesophageal reflux disease); Coccygodynia; Hollenhorst plaque, right eye; Family history of coronary artery disease; Carotid bruit; Mixed hyperlipidemia; Smoker; Other specified dorsopathies, sacral and sacrococcygeal region; and  Neurogenic pain on their problem list. Her primarily concern today is the Back Pain (low and left) and Leg Pain (left anterior to knee)  Pain Assessment: Location: Lower, Left Back Radiating: left thigh and left knee Onset: More than a month ago Duration: Chronic pain Quality: Constant, Throbbing, Shooting Severity: 6 /10 (subjective, self-reported pain score)  Note: Reported level is inconsistent with clinical observations. Clinically the patient looks like a 2/10 A 2/10 is viewed as "Mild to Moderate" and described as noticeable and distracting. Impossible to hide from other people. More frequent flare-ups. Still possible to adapt and function close to normal. It can be very annoying and may have occasional stronger flare-ups. With discipline, patients may get used to it and adapt. Information on the proper use of the pain scale provided to the patient today. When using our objective Pain Scale, levels between 6 and 10/10 are said to belong in an emergency room, as it progressively worsens from a 6/10, described as severely limiting, requiring emergency care not usually available at an outpatient pain management facility. At a 6/10 level, communication becomes difficult and requires great effort. Assistance to reach the emergency department may be required. Facial flushing and profuse sweating along with potentially dangerous increases in heart rate and blood pressure will be evident. Timing: Constant Modifying factors: medications, rest, heat, ice, rest , hot showers BP: 113/67  HR: 73  Ashley Fields comes in today for post-procedure evaluation after the treatment done on 05/09/2017.  Further details on both, my assessment(s), as well as the proposed treatment plan, please see below.  Post-Procedure Assessment  04/25/2017 Procedure: Diagnostic left sided caudal epidural steroid injection + epidurogram #1 under fluoroscopic guidance, no sedation Pre-procedure pain score:  3/10 Post-procedure pain  score: 0/10 (100% relief) Influential Factors: BMI: 30.54 kg/m Intra-procedural challenges: None observed.         Assessment challenges: None detected.  Reported side-effects: None.        Post-procedural adverse reactions or complications: None reported         Sedation: No sedation used. When no sedatives are used, the analgesic levels obtained are directly associated to the effectiveness of the local anesthetics. However, when sedation is provided, the level of analgesia obtained during the initial 1 hour following the intervention, is believed to be the result of a combination of factors. These factors may include, but are not limited to: 1. The effectiveness of the local anesthetics used. 2. The effects of the analgesic(s) and/or anxiolytic(s) used. 3. The degree of discomfort experienced by the patient at the time of the procedure. 4. The patients ability and reliability in recalling and recording the events. 5. The presence and influence of possible secondary gains and/or psychosocial factors. Reported result: Relief experienced during the 1st hour after the procedure: 80 % (Ultra-Short Term Relief)            Interpretative annotation: Clinically appropriate result. No IV Analgesic or Anxiolytic given, therefore benefits are completely due to Local Anesthetic effects.          Effects of local anesthetic: The analgesic effects attained during this period are directly associated to the localized infiltration of local anesthetics and therefore cary significant diagnostic value as to the etiological location, or anatomical origin, of the pain. Expected duration of relief is directly dependent on the pharmacodynamics of the local anesthetic used. Long-acting (4-6 hours) anesthetics used.  Reported result: Relief during the next 4 to 6 hour after the procedure: 80 % (Short-Term Relief)            Interpretative annotation: Clinically appropriate result. Analgesia during this  period is likely to be Local Anesthetic-related.          Long-term benefit: Defined as the period of time past the expected duration of local anesthetics (1 hour for short-acting and 4-6 hours for long-acting). With the possible exception of prolonged sympathetic blockade from the local anesthetics, benefits during this period are typically attributed to, or associated with, other factors such as analgesic sensory neuropraxia, antiinflammatory effects, or beneficial biochemical changes provided by agents other than the local anesthetics.  Reported result: Extended relief following procedure: 0 % (Long-Term Relief)            Interpretative annotation: Clinically possible results. Recurrence of symptoms. No long-term benefit expected. No significant inflammatory component detected. Etiology is likely mechanical rather than inflammatory.  Current benefits: Defined as reported results that persistent at this point in time.   Analgesia: 0-25 %            Function: Somewhat improved ROM: Somewhat improved Interpretative annotation: Recurrence of symptoms. No permanent benefit expected. Results would suggest persistent aggravating factors.          Interpretation: Results would suggest a successful diagnostic intervention.                  Plan:  Re-assessment of algesic etiology. Interestingly, based on today's physical exam, she seems to be having pain coming from her left hip and SI joint area more than the radicular pain. In fact, she indicates that her leg pain seems to now be gone except for pain over her thigh which would follow more the distribution of an upper nerve roots such as L1/L2 or perhaps the joint itself. In any case, the pattern has changed since the epidural which could suggest that it may have had some benefit. We  will see better once we have treated her SI joint and hip joint. The plan here will be to first treat the SI joint and hip joint and if it turns out that she gets excellent  relief, we may be able to postpone the Racz procedure and see how long the caudal epidural will last by itself.  Laboratory Chemistry  Inflammation Markers (CRP: Acute Phase) (ESR: Chronic Phase) Lab Results  Component Value Date   CRP 0.7 08/14/2016   ESRSEDRATE 9 08/14/2016                         Renal Markers Lab Results  Component Value Date   BUN 17 08/14/2016   CREATININE 0.80 08/14/2016   BCR 21 08/14/2016   GFRAA 93 08/14/2016   GFRNONAA 80 08/14/2016                             Hepatic Markers Lab Results  Component Value Date   AST 20 08/14/2016   ALT 10 08/14/2016   ALBUMIN 4.9 (H) 08/14/2016                        Note: Lab results reviewed.  Recent Diagnostic Imaging Results  NM Myocar Multi W/Spect W/Wall Motion / EF Pharmacological myocardial perfusion imaging study with no significant   ischemia Normal wall motion, EF estimated at 83% No EKG changes concerning for ischemia at peak stress or in recovery. Low risk scan  Signed, Esmond Plants, MD, Ph.D [facility]    Complexity Note: I personally reviewed the fluoroscopic imaging of the procedure.                        Meds   Current Outpatient Medications:  .  ARIPiprazole (ABILIFY) 5 MG tablet, Take 1 tablet (5 mg total) by mouth daily., Disp: 30 tablet, Rfl: 2 .  aspirin EC 81 MG tablet, Take 1 tablet (81 mg total) by mouth daily., Disp: , Rfl:  .  Biotin 10000 MCG TABS, Take 10,000 mcg by mouth 1 day or 1 dose., Disp: , Rfl:  .  butalbital-acetaminophen-caffeine (FIORICET, ESGIC) 50-325-40 MG tablet, TAKE 1 TABLET BY MOUTH EVERY NIGHT AS NEEDED FOR HEADACHE, Disp: , Rfl: 2 .  cholecalciferol (VITAMIN D) 1000 units tablet, Take 1,000 Units by mouth daily., Disp: , Rfl:  .  Difluprednate (DUREZOL) 0.05 % EMUL, Place into the left eye daily., Disp: , Rfl:  .  estradiol (ESTRACE) 1 MG tablet, Take 1 mg by mouth daily., Disp: , Rfl:  .  etodolac (LODINE) 500 MG tablet, Take 1 tablet (500 mg  total) by mouth 2 (two) times daily., Disp: 60 tablet, Rfl: 1 .  ezetimibe (ZETIA) 10 MG tablet, Take 1 tablet (10 mg total) by mouth daily., Disp: 90 tablet, Rfl: 3 .  ferrous sulfate 325 (65 FE) MG tablet, Take 325 mg by mouth daily., Disp: , Rfl:  .  fluticasone (FLONASE) 50 MCG/ACT nasal spray, 1 SPRAY IN EACH NOSTRIL ONCE A DAY NASALLY 30 DAYS as needed, Disp: , Rfl: 3 .  gabapentin (NEURONTIN) 800 MG tablet, Take 1 tablet (800 mg total) by mouth 3 (three) times daily., Disp: 270 tablet, Rfl: 0 .  LORazepam (ATIVAN) 0.5 MG tablet, Take 1 tablet (0.5 mg total) by mouth 2 (two) times daily as needed for anxiety., Disp: 45 tablet, Rfl: 2 .  Magnesium 500 MG CAPS, Take 1 capsule (500 mg total) by mouth 2 (two) times daily at 8 am and 10 pm., Disp: 60 capsule, Rfl: 5 .  Melatonin 10 MG CAPS, Take 1 capsule by mouth at bedtime., Disp: , Rfl:  .  Multiple Vitamin (MULTIVITAMIN WITH MINERALS) TABS tablet, Take 1 tablet by mouth daily., Disp: , Rfl:  .  omeprazole (PRILOSEC) 40 MG capsule, Take 40 mg by mouth daily., Disp: , Rfl: 0 .  ondansetron (ZOFRAN ODT) 4 MG disintegrating tablet, Take 1 tablet (4 mg total) by mouth every 8 (eight) hours as needed for nausea or vomiting., Disp: 30 tablet, Rfl: 0 .  rosuvastatin (CRESTOR) 40 MG tablet, Take 1 tablet (40 mg total) by mouth daily., Disp: 90 tablet, Rfl: 3 .  sertraline (ZOLOFT) 100 MG tablet, Take 1 tablet (100 mg total) by mouth 2 (two) times daily., Disp: 180 tablet, Rfl: 1 .  tiZANidine (ZANAFLEX) 4 MG tablet, Take 1 tablet (4 mg total) by mouth 3 (three) times daily., Disp: 90 tablet, Rfl: 1 .  traMADol (ULTRAM) 50 MG tablet, Take 1 tablet (50 mg total) by mouth every 6 (six) hours as needed for severe pain. (Patient taking differently: Take 50 mg by mouth every 4 (four) hours as needed for severe pain. ), Disp: 120 tablet, Rfl: 1 .  traZODone (DESYREL) 150 MG tablet, Take 150 mg by mouth at bedtime., Disp: , Rfl: 0 .  Turmeric 500 MG CAPS, Take 2  capsules by mouth daily., Disp: , Rfl:  .  vitamin B-12 (CYANOCOBALAMIN) 500 MCG tablet, Take 500 mcg by mouth daily., Disp: , Rfl:  .  vitamin E 100 UNIT capsule, Take by mouth daily., Disp: , Rfl:   ROS  Constitutional: Denies any fever or chills Gastrointestinal: No reported hemesis, hematochezia, vomiting, or acute GI distress Musculoskeletal: Denies any acute onset joint swelling, redness, loss of ROM, or weakness Neurological: No reported episodes of acute onset apraxia, aphasia, dysarthria, agnosia, amnesia, paralysis, loss of coordination, or loss of consciousness  Allergies  Ashley Fields is allergic to flagyl [metronidazole] and tape.  PFSH  Drug: Ashley Fields  reports that she does not use drugs. Alcohol:  reports that she does not drink alcohol. Tobacco:  reports that she has quit smoking. She quit after 4.00 years of use. She has never used smokeless tobacco. Medical:  has a past medical history of Allergy, Anemia, Chronic kidney disease, Depression, Disp fx of cuboid bone of right foot, init for clos fx (01/01/2017), GERD (gastroesophageal reflux disease), Headache, Hepatitis C, Hyperlipidemia, Osteoporosis, Thyroid disease, and Urine incontinence. Surgical: Ashley Fields  has a past surgical history that includes Cesarean section (07/27/1981); Abdominal hysterectomy (1987); Breast reduction surgery (Bilateral, 1997); Cholecystectomy (03/2004); Appendectomy (12/2008); Gastric bypass (2005); mini tummy tuck (05/07/2016); breast lift bilateral, implants (05/18/2015); Lumbar laminectomy (06/2006); 5 miscarriages; neck surgery C3-C7 (02/10/2015); IVC FILTER INSERTION (12/2003); Spinal cord stimulator implant (11/2010); Shoulder acromioplasty (Left, 03/27/2013); Reduction mammaplasty (Bilateral, 1997); Augmentation mammaplasty (Bilateral, 2015); and Augmentation mammaplasty (Bilateral, 2018). Family: family history includes COPD in her mother; Colon polyps in her sister; Depression in her sister;  Diabetes in her brother and mother; Heart attack in her brother, mother, and sister; Heart attack (age of onset: 82) in her father; Heart disease in her brother, father, and mother; Lung cancer in her mother; Stroke in her mother.  Constitutional Exam  General appearance: Well nourished, well developed, and well hydrated. In no apparent acute distress Vitals:   06/17/17 3086  BP: 113/67  Pulse: 73  Resp: 18  Temp: 98.2 F (36.8 C)  TempSrc: Oral  SpO2: 100%  Weight: 167 lb (75.8 kg)  Height: 5' 2"  (1.575 m)   BMI Assessment: Estimated body mass index is 30.54 kg/m as calculated from the following:   Height as of this encounter: 5' 2"  (1.575 m).   Weight as of this encounter: 167 lb (75.8 kg).  BMI interpretation table: BMI level Category Range association with higher incidence of chronic pain  <18 kg/m2 Underweight   18.5-24.9 kg/m2 Ideal body weight   25-29.9 kg/m2 Overweight Increased incidence by 20%  30-34.9 kg/m2 Obese (Class I) Increased incidence by 68%  35-39.9 kg/m2 Severe obesity (Class II) Increased incidence by 136%  >40 kg/m2 Extreme obesity (Class III) Increased incidence by 254%   Patient's current BMI Ideal Body weight  Body mass index is 30.54 kg/m. Ideal body weight: 50.1 kg (110 lb 7.2 oz) Adjusted ideal body weight: 60.4 kg (133 lb 1.1 oz)   BMI Readings from Last 4 Encounters:  06/17/17 30.54 kg/m  06/10/17 31.50 kg/m  06/10/17 31.64 kg/m  05/20/17 31.09 kg/m   Wt Readings from Last 4 Encounters:  06/17/17 167 lb (75.8 kg)  06/10/17 172 lb 4 oz (78.1 kg)  06/10/17 173 lb (78.5 kg)  05/20/17 170 lb (77.1 kg)  Psych/Mental status: Alert, oriented x 3 (person, place, & time)       Eyes: PERLA Respiratory: No evidence of acute respiratory distress  Cervical Spine Area Exam  Skin & Axial Inspection: No masses, redness, edema, swelling, or associated skin lesions Alignment: Symmetrical Functional ROM: Unrestricted ROM      Stability: No  instability detected Muscle Tone/Strength: Functionally intact. No obvious neuro-muscular anomalies detected. Sensory (Neurological): Unimpaired Palpation: No palpable anomalies              Upper Extremity (UE) Exam    Side: Right upper extremity  Side: Left upper extremity  Skin & Extremity Inspection: Skin color, temperature, and hair growth are WNL. No peripheral edema or cyanosis. No masses, redness, swelling, asymmetry, or associated skin lesions. No contractures.  Skin & Extremity Inspection: Skin color, temperature, and hair growth are WNL. No peripheral edema or cyanosis. No masses, redness, swelling, asymmetry, or associated skin lesions. No contractures.  Functional ROM: Unrestricted ROM          Functional ROM: Unrestricted ROM          Muscle Tone/Strength: Functionally intact. No obvious neuro-muscular anomalies detected.  Muscle Tone/Strength: Functionally intact. No obvious neuro-muscular anomalies detected.  Sensory (Neurological): Unimpaired          Sensory (Neurological): Unimpaired          Palpation: No palpable anomalies              Palpation: No palpable anomalies              Provocative Test(s):  Phalen's test: deferred Tinel's test: deferred Apley's scratch test (touch opposite shoulder):  Action 1 (Across chest): deferred Action 2 (Overhead): deferred Action 3 (LB reach): deferred   Provocative Test(s):  Phalen's test: deferred Tinel's test: deferred Apley's scratch test (touch opposite shoulder):  Action 1 (Across chest): deferred Action 2 (Overhead): deferred Action 3 (LB reach): deferred    Thoracic Spine Area Exam  Skin & Axial Inspection: No masses, redness, or swelling Alignment: Symmetrical Functional ROM: Unrestricted ROM Stability: No instability detected Muscle Tone/Strength: Functionally intact. No obvious neuro-muscular anomalies detected. Sensory (Neurological): Unimpaired Muscle  strength & Tone: No palpable anomalies  Lumbar Spine Area  Exam  Skin & Axial Inspection: No masses, redness, or swelling Alignment: Symmetrical Functional ROM: Decreased ROM       Stability: No instability detected Muscle Tone/Strength: Functionally intact. No obvious neuro-muscular anomalies detected. Sensory (Neurological): Movement-associated pain Palpation: Complains of area being tender to palpation       Provocative Tests: Lumbar Hyperextension/rotation test: deferred today       Lumbar quadrant test (Kemp's test): deferred today       Lumbar Lateral bending test: deferred today       Patrick's Maneuver: (+) for left-sided S-I arthralgia and for bilateral hip arthralgia (L>R) FABER test: Positive for left-sided S-I arthralgia Thigh-thrust test: deferred today       S-I compression test: deferred today       S-I distraction test: deferred today        Gait & Posture Assessment  Ambulation: Patient ambulates using a cane Gait: Limited. Using assistive device to ambulate Posture: Difficulty standing up straight, due to pain   Lower Extremity Exam    Side: Right lower extremity  Side: Left lower extremity  Stability: No instability observed          Stability: No instability observed          Skin & Extremity Inspection: Skin color, temperature, and hair growth are WNL. No peripheral edema or cyanosis. No masses, redness, swelling, asymmetry, or associated skin lesions. No contractures.  Skin & Extremity Inspection: Skin color, temperature, and hair growth are WNL. No peripheral edema or cyanosis. No masses, redness, swelling, asymmetry, or associated skin lesions. No contractures.  Functional ROM: Unrestricted ROM                  Functional ROM: Decreased ROM for hip joint          Muscle Tone/Strength: Functionally intact. No obvious neuro-muscular anomalies detected.  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 Diagnoses of Chronic sacroiliac joint pain (Bilateral) (R>L), Chronic Low back pain (Primary Area of Pain) (Bilateral)  (R>L), Other specified dorsopathies, sacral and sacrococcygeal region, Osteoarthritis of hip (Bilateral), Chronic lower extremity pain (Tertiary Area of Pain) (Bilateral) (L>R), Failed back surgical syndrome, and Neurogenic pain were also pertinent to this visit.  Status Diagnosis  Worsening Worsening Persistent 1. Chronic hip pain (Secondary Area of Pain) (Bilateral) (L>R)   2. Chronic sacroiliac joint pain (Bilateral) (R>L)   3. Chronic Low back pain (Primary Area of Pain) (Bilateral)  (R>L)   4. Other specified dorsopathies, sacral and sacrococcygeal region   5. Osteoarthritis of hip (Bilateral)   6. Chronic lower extremity pain (Tertiary Area of Pain) (Bilateral) (L>R)   7. Failed back surgical syndrome   8. Neurogenic pain     Problems updated and reviewed during this visit: Problem  Other Specified Dorsopathies, Sacral and Sacrococcygeal Region  Neurogenic Pain  Smoker  Family History of Coronary Artery Disease  Carotid Bruit  Mixed Hyperlipidemia  Hollenhorst Plaque, Right Eye   Dr Phoebe Sharps - last visit 05/14/17 for follow-up on this issue - Dx concerning for vascular etiology, requested embolic work-up - Patient advised to take ASA 81 from Optometry, and requesting Carotid US, ECHO  Plan of Care  Pharmacotherapy (Medications Ordered): Meds ordered this encounter  Medications  . gabapentin (NEURONTIN) 800 MG tablet    Sig: Take 1 tablet (800 mg total) by mouth 3 (three) times daily.    Dispense:  270 tablet    Refill:  0    Do not place this medication, or any other prescription from our practice, on "Automatic Refill". Patient may have prescription  filled one day early if pharmacy is closed on scheduled refill date.   Medications administered today: Shenequa Howse had no medications administered during this visit.   Procedure Orders     SACROILIAC JOINT INJECTION     HIP INJECTION     Racz (One Day) Lab Orders  No laboratory test(s) ordered today   Imaging Orders  No imaging studies ordered today   Referral Orders  No referral(s) requested today    Interventional management options: Planned, scheduled, and/or pending:   Diagnostic left sided sacroiliac joint + Hip joint injection #1 under fluoroscopic guidance, with sedation. Therapeutic RACZ procedure after above.   Considering:   Diagnosticbilateral L3-4 transforaminal LESI  Diagnostic bilateral lumbar facet block  Possiblebilateral lumbar facetRFA   Diagnostic bilateral sacroiliac joint block  Possible bilateral sacroiliac joint RFA  Diagnosticleft hip injection  Diagnosticleft femoral nerve + obturator nerve block   Possible left femoral nerve + obturator nerve RFA  Diagnostic left knee intra-articular steroid injection  Possible series of 5 left intra-articular Hyalgan knee injection  Diagnosticleft genicular nerve block  Possible left genicular nerve RFA  Diagnosticcervical epidural steroid injection  Diagnosticbilateral cervical facet block  Possiblebilateral cervical RFA    Palliative PRN treatment(s):   None at this time   Provider-requested follow-up: Return for Procedure (no sedation): (L) Hip + SI BLK #1.  Future Appointments  Date Time Provider [place]  07/05/2017 11:00 AM Olin Hauser, DO [place] [facility] None  07/15/2017  9:30 AM Vevelyn Francois, NP [facility] None   Primary Care Physician: Olin Hauser, DO Location: [facility] Outpatient Pain Management Facility Note by: Gaspar Cola, MD Date: 06/17/2017; Time: 9:53 AM

## 2017-06-26 NOTE — Progress Notes (Signed)
Patient's Name: Ashley Fields  MRN: [id number]  Referring Provider: Milinda Pointer, MD  DOB: 06-24-1955  PCP: Olin Hauser, DO  DOS: 06/27/2017  Note by: Gaspar Cola, MD  Service setting: Ambulatory outpatient  Specialty: Interventional Pain Management  Patient type: Established  Location: [facility] (AMB) Pain Management Facility  Visit type: Interventional Procedure   Primary Reason for Visit: Interventional Pain Management Treatment. CC: Back Pain (low left)  Procedure #1:  Anesthesia, Analgesia, Anxiolysis:  Type: Intra-Articular Hip Injection #1  Primary Purpose: Diagnostic Region: Posterolateral hip joint area. Level: Lower pelvic and hip joint level. Target Area: Superior aspect of the hip joint cavity, going thru the superior portion of the capsular ligament. Approach: Posterolateral approach. Laterality: Left-Sided  Type: Local Anesthesia Indication(s): Analgesia         Route: Infiltration ([place]/IM) IV Access: Declined Sedation: Declined  Local Anesthetic: Lidocaine 1-2%   Position: Lateral Decubitus with bad side up Prepped Area: Entire Posterolateral hip area. Prepping solution: ChloraPrep (2% chlorhexidine gluconate and 70% isopropyl alcohol)   Indications: 1. Osteoarthritis of hip (Bilateral)   2. Chronic hip pain (Secondary Area of Pain) (Bilateral) (L>R)    Procedure #2:  Anesthesia, Analgesia, Anxiolysis:  Type: Diagnostic Sacroiliac Joint Steroid Injection #1  Region: Superior Lumbosacral Region Level: PSIS (Posterior Superior Iliac Spine) Laterality: Left-Sided  Type: Local Anesthesia Indication(s): Analgesia         Route: Infiltration ([place]/IM) IV Access: Declined Sedation: Declined  Local Anesthetic: Lidocaine 1-2%   Indications: 1. Other specified dorsopathies, sacral and sacrococcygeal region   2. Chronic sacroiliac joint pain (Bilateral) (R>L)  3.  Chronic Low back pain (Primary Area of Pain) (Bilateral)  (R>L)    Pain  Score: Pre-procedure: 3 /10 Post-procedure: 0-No pain/10  Pre-op Assessment:  Ashley Fields is a 62 y.o. (year old), female patient, seen today for interventional treatment. She  has a past surgical history that includes Cesarean section (07/27/1981); Abdominal hysterectomy (1987); Breast reduction surgery (Bilateral, 1997); Cholecystectomy (03/2004); Appendectomy (12/2008); Gastric bypass (2005); mini tummy tuck (05/07/2016); breast lift bilateral, implants (05/18/2015); Lumbar laminectomy (06/2006); 5 miscarriages; neck surgery C3-C7 (02/10/2015); IVC FILTER INSERTION (12/2003); Spinal cord stimulator implant (11/2010); Shoulder acromioplasty (Left, 03/27/2013); Reduction mammaplasty (Bilateral, 1997); Augmentation mammaplasty (Bilateral, 2015); and Augmentation mammaplasty (Bilateral, 2018). Ashley Fields has a current medication list which includes the following prescription(s): aripiprazole, aspirin ec, biotin, butalbital-acetaminophen-caffeine, cholecalciferol, difluprednate, estradiol, etodolac, ezetimibe, ferrous sulfate, fluticasone, gabapentin, lorazepam, magnesium, melatonin, multivitamin with minerals, omeprazole, ondansetron, rosuvastatin, sertraline, tizanidine, tramadol, trazodone, turmeric, vitamin b-12, and vitamin e. Her primarily concern today is the Back Pain (low left)  Initial Vital Signs:  Pulse/HCG Rate: 70  Temp: 98.4 F (36.9 C) Resp: 18 BP: 123/76 SpO2: 99 %  BMI: Estimated body mass index is 31.28 kg/m as calculated from the following:   Height as of this encounter: 5\' 2"  (1.575 m).   Weight as of this encounter: 171 lb (77.6 kg).  Risk Assessment: Allergies: Reviewed. She is allergic to flagyl [metronidazole] and tape.  Allergy Precautions: None required Coagulopathies: Reviewed. None identified.  Blood-thinner therapy: None at this time Active Infection(s): Reviewed. None identified. Ashley Fields is afebrile  Site Confirmation: Ashley Fields was asked to confirm the  procedure and laterality before marking the site Procedure checklist: Completed Consent: Before the procedure and under the influence of no sedative(s), amnesic(s), or anxiolytics, the patient was informed of the treatment options, risks and possible complications. To fulfill our ethical and legal obligations, as recommended by the [place]  [employer]'s Code of Ethics, I have informed the patient of my clinical impression; the nature and purpose of the treatment or procedure; the risks, benefits, and possible complications of the intervention; the alternatives, including doing nothing; the risk(s) and benefit(s) of the alternative treatment(s) or procedure(s); and the risk(s) and benefit(s) of doing nothing. The patient was provided information about the general risks and possible complications associated with the procedure. These may include, but are not limited to: failure to achieve desired goals, infection, bleeding, organ or nerve damage, allergic reactions, paralysis, and death. In addition, the patient was informed of those risks and complications associated to the procedure, such as failure to decrease pain; infection; bleeding; organ or nerve damage with subsequent damage to sensory, motor, and/or autonomic systems, resulting in permanent pain, numbness, and/or weakness of one or several areas of the body; allergic reactions; (i.e.: anaphylactic reaction); and/or death. Furthermore, the patient was informed of those risks and complications associated with the medications. These include, but are not limited to: allergic reactions (i.e.: anaphylactic or anaphylactoid reaction(s)); adrenal axis suppression; blood sugar elevation that in diabetics may result in ketoacidosis or comma; water retention that in patients with history of congestive heart failure may result in shortness of breath, pulmonary edema, and decompensation with resultant heart failure; weight gain; swelling or edema;  medication-induced neural toxicity; particulate matter embolism and blood vessel occlusion with resultant organ, and/or nervous system infarction; and/or aseptic necrosis of one or more joints. Finally, the patient was informed that Medicine is not an exact science; therefore, there is also the possibility of unforeseen or unpredictable risks and/or possible complications that may result in a catastrophic outcome. The patient indicated having understood very clearly. We have given the patient no guarantees and we have made no promises. Enough time was given to the patient to ask questions, all of which were answered to the patient's satisfaction. Ashley Fields has indicated that she wanted to continue with the procedure. Attestation: I, the ordering provider, attest that I have discussed with the patient the benefits, risks, side-effects, alternatives, likelihood of achieving goals, and potential problems during recovery for the procedure that I have provided informed consent. Date  Time: 06/27/2017  7:53 AM  Pre-Procedure Preparation:  Monitoring: As per clinic protocol. Respiration, ETCO2, SpO2, BP, heart rate and rhythm monitor placed and checked for adequate function Safety Precautions: Patient was assessed for positional comfort and pressure points before starting the procedure. Time-out: I initiated and conducted the "Time-out" before starting the procedure, as per protocol. The patient was asked to participate by confirming the accuracy of the "Time Out" information. Verification of the correct person, site, and procedure were performed and confirmed by me, the nursing staff, and the patient. "Time-out" conducted as per Joint Commission's Universal Protocol (UP.01.01.01). Time: 0826  Description of Procedure #1:  Safety Precautions: Aspiration looking for blood return was conducted prior to all injections. At no point did we inject any substances, as a needle was being advanced. No attempts were made  at seeking any paresthesias. Safe injection practices and needle disposal techniques used. Medications properly checked for expiration dates. SDV (single dose vial) medications used. Description of the Procedure: Protocol guidelines were followed. The patient was placed in position over the fluoroscopy table. The target area was identified and the area prepped in the usual manner. Skin & deeper tissues infiltrated with local anesthetic. Appropriate amount of time allowed to pass for local anesthetics to take effect. The procedure needles were then advanced to the target  area. Proper needle placement secured. Negative aspiration confirmed. Solution injected in intermittent fashion, asking for systemic symptoms every 0.5cc of injectate. The needles were then removed and the area cleansed, making sure to leave some of the prepping solution back to take advantage of its long term bactericidal properties.  Start Time: 0826 hrs.  Materials:  Needle(s) Type: Regular needle Gauge: 22G Length: 7-in Medication(s): Please see orders for medications and dosing details.  Imaging Guidance (Non-Spinal):  Type of Imaging Technique: Fluoroscopy Guidance (Non-Spinal) Indication(s): Assistance in needle guidance and placement for procedures requiring needle placement in or near specific anatomical locations not easily accessible without such assistance. Exposure Time: Please see nurses notes. Contrast: Before injecting any contrast, we confirmed that the patient did not have an allergy to iodine, shellfish, or radiological contrast. Once satisfactory needle placement was completed at the desired level, radiological contrast was injected. Contrast injected under live fluoroscopy. No contrast complications. See chart for type and volume of contrast used. Fluoroscopic Guidance: I was personally present during the use of fluoroscopy. "Tunnel Vision Technique" used to obtain the best possible view of the target area.  Parallax error corrected before commencing the procedure. "Direction-depth-direction" technique used to introduce the needle under continuous pulsed fluoroscopy. Once target was reached, antero-posterior, oblique, and lateral fluoroscopic projection used confirm needle placement in all planes. Images permanently stored in EMR. Interpretation: I personally interpreted the imaging intraoperatively. Adequate needle placement confirmed in multiple planes. Appropriate spread of contrast into desired area was observed. No evidence of afferent or efferent intravascular uptake. Permanent images saved into the patient's record.  Description of Procedure #2:  Position: Prone Target Area: Superior, posterior, aspect of the sacroiliac fissure Approach: Posterior, paraspinal, ipsilateral approach. Area Prepped: Entire Lower Lumbosacral Region Prepping solution: ChloraPrep (2% chlorhexidine gluconate and 70% isopropyl alcohol) Safety Precautions: Aspiration looking for blood return was conducted prior to all injections. At no point did we inject any substances, as a needle was being advanced. No attempts were made at seeking any paresthesias. Safe injection practices and needle disposal techniques used. Medications properly checked for expiration dates. SDV (single dose vial) medications used. Description of the Procedure: Protocol guidelines were followed. The patient was placed in position over the procedure table. The target area was identified and the area prepped in the usual manner. Skin & deeper tissues infiltrated with local anesthetic. Appropriate amount of time allowed to pass for local anesthetics to take effect. The procedure needle was advanced under fluoroscopic guidance into the sacroiliac joint until a firm endpoint was obtained. Proper needle placement secured. Negative aspiration confirmed. Solution injected in intermittent fashion, asking for systemic symptoms every 0.5cc of injectate. The needles  were then removed and the area cleansed, making sure to leave some of the prepping solution back to take advantage of its long term bactericidal properties.  Vitals:   06/27/17 0830 06/27/17 0835 06/27/17 0840 06/27/17 0846  BP: 127/86 136/78 (!) 144/80 (!) 146/82  Pulse: 69 66 64 66  Resp: 15 16 15 16   Temp:      SpO2: 99% 96% 100% 100%  Weight:      Height:       End Time: 0840 hrs. Materials:  Needle(s) Type: Regular needle Gauge: 22G Length: 3.5-in Medication(s): Please see orders for medications and dosing details.  Imaging Guidance (Non-Spinal):  Type of Imaging Technique: Fluoroscopy Guidance (Non-Spinal) Indication(s): Assistance in needle guidance and placement for procedures requiring needle placement in or near specific anatomical locations not easily accessible without such  assistance. Exposure Time: Please see nurses notes. Contrast: Before injecting any contrast, we confirmed that the patient did not have an allergy to iodine, shellfish, or radiological contrast. Once satisfactory needle placement was completed at the desired level, radiological contrast was injected. Contrast injected under live fluoroscopy. No contrast complications. See chart for type and volume of contrast used. Fluoroscopic Guidance: I was personally present during the use of fluoroscopy. "Tunnel Vision Technique" used to obtain the best possible view of the target area. Parallax error corrected before commencing the procedure. "Direction-depth-direction" technique used to introduce the needle under continuous pulsed fluoroscopy. Once target was reached, antero-posterior, oblique, and lateral fluoroscopic projection used confirm needle placement in all planes. Images permanently stored in EMR. Interpretation: I personally interpreted the imaging intraoperatively. Adequate needle placement confirmed in multiple planes. Appropriate spread of contrast into desired area was observed. No evidence of afferent or  efferent intravascular uptake. Permanent images saved into the patient's record.  Antibiotic Prophylaxis:   Anti-infectives (From admission, onward)   None     Indication(s): None identified  Post-operative Assessment:  Post-procedure Vital Signs:  Pulse/HCG Rate: 66  Temp: 98.4 F (36.9 C) Resp: 16 BP: (!) 146/82 SpO2: 100 %  EBL: None  Complications: No immediate post-treatment complications observed by team, or reported by patient.  Note: The patient tolerated the entire procedure well. A repeat set of vitals were taken after the procedure and the patient was kept under observation following institutional policy, for this type of procedure. Post-procedural neurological assessment was performed, showing return to baseline, prior to discharge. The patient was provided with post-procedure discharge instructions, including a section on how to identify potential problems. Should any problems arise concerning this procedure, the patient was given instructions to immediately contact us, at any time, without hesitation. In any case, we plan to contact the patient by telephone for a follow-up status report regarding this interventional procedure.  Comments:  No additional relevant information.  Plan of Care   Imaging Orders     DG C-Arm 1-60 Min-No Report  Procedure Orders     SACROILIAC JOINT INJECTION     HIP INJECTION  Medications ordered for procedure: Meds ordered this encounter  Medications  . iopamidol (ISOVUE-M) 41 % intrathecal injection 10 mL    Must be Myelogram-compatible. If not available, you may substitute with a water-soluble, non-ionic, hypoallergenic, myelogram-compatible radiological contrast medium.  Marland Kitchen lidocaine (XYLOCAINE) 2 % (with pres) injection 400 mg  . DISCONTD: midazolam (VERSED) 5 MG/5ML injection 1-2 mg    Make sure Flumazenil is available in the pyxis when using this medication. If oversedation occurs, administer 0.2 mg IV over 15 sec. If after 45  sec no response, administer 0.2 mg again over 1 min; may repeat at 1 min intervals; not to exceed 4 doses (1 mg)  . DISCONTD: fentaNYL (SUBLIMAZE) injection 25-50 mcg    Make sure Narcan is available in the pyxis when using this medication. In the event of respiratory depression (RR< 8/min): Titrate NARCAN (naloxone) in increments of 0.1 to 0.2 mg IV at 2-3 minute intervals, until desired degree of reversal.  . DISCONTD: lactated ringers infusion 1,000 mL  . ropivacaine (PF) 2 mg/mL (0.2%) (NAROPIN) injection 4 mL  . methylPREDNISolone acetate (DEPO-MEDROL) injection 40 mg  . ropivacaine (PF) 2 mg/mL (0.2%) (NAROPIN) injection 4 mL  . methylPREDNISolone acetate (DEPO-MEDROL) injection 40 mg   Medications administered: We administered iopamidol, lidocaine, ropivacaine (PF) 2 mg/mL (0.2%), methylPREDNISolone acetate, ropivacaine (PF) 2 mg/mL (0.2%), and  methylPREDNISolone acetate.  See the medical record for exact dosing, route, and time of administration.  New Prescriptions   No medications on file   Disposition: Discharge home  Discharge Date & Time: 06/27/2017; 0847 hrs.   Physician-requested Follow-up: Return for post-procedure eval (2 wks), w/ Dr. Dossie Arbour.  Future Appointments  Date Time Provider [place]  07/05/2017 11:00 AM Olin Hauser, DO [facility] None  07/15/2017  9:30 AM Vevelyn Francois, NP [facility] None  07/23/2017  7:45 AM Milinda Pointer, MD [facility] None   Primary Care Physician: Olin Hauser, DO Location: [facility] Facility Note by: Gaspar Cola, MD Date: 06/27/2017; Time: 10:25 AM  Disclaimer:  Medicine is not an Chief Strategy Officer. The only guarantee in medicine is that nothing is guaranteed. It is important to note that the decision to proceed with this intervention was based on the information collected from the patient. The Data and conclusions were drawn from the patient's questionnaire, the interview, and  the physical examination. Because the information was provided in large part by the patient, it cannot be guaranteed that it has not been purposely or unconsciously manipulated. Every effort has been made to obtain as much relevant data as possible for this evaluation. It is important to note that the conclusions that lead to this procedure are derived in large part from the available data. Always take into account that the treatment will also be dependent on availability of resources and existing treatment guidelines, considered by other Pain Management Practitioners as being common knowledge and practice, at the time of the intervention. For Medico-Legal purposes, it is also important to point out that variation in procedural techniques and pharmacological choices are the acceptable norm. The indications, contraindications, technique, and results of the above procedure should only be interpreted and judged by a Board-Certified Interventional Pain Specialist with extensive familiarity and expertise in the same exact procedure and technique.

## 2017-06-27 ENCOUNTER — Telehealth: Payer: Self-pay

## 2017-06-27 ENCOUNTER — Other Ambulatory Visit: Payer: Self-pay | Admitting: Pain Medicine

## 2017-06-27 ENCOUNTER — Ambulatory Visit (HOSPITAL_BASED_OUTPATIENT_CLINIC_OR_DEPARTMENT_OTHER): Payer: Medicare HMO | Admitting: Pain Medicine

## 2017-06-27 ENCOUNTER — Ambulatory Visit
Admission: RE | Admit: 2017-06-27 | Discharge: 2017-06-27 | Disposition: A | Discharge status: Home / Self Care | Payer: Medicare HMO | Source: Ambulatory Visit | Attending: Pain Medicine | Admitting: Pain Medicine

## 2017-06-27 ENCOUNTER — Encounter: Payer: Self-pay | Admitting: Pain Medicine

## 2017-06-27 ENCOUNTER — Other Ambulatory Visit: Payer: Self-pay

## 2017-06-27 VITALS — BP 146/82 | HR 66 | Temp 98.4°F | Resp 16 | Ht 62.0 in | Wt 171.0 lb

## 2017-06-27 DIAGNOSIS — M160 Bilateral primary osteoarthritis of hip: Secondary | ICD-10-CM | POA: Diagnosis not present

## 2017-06-27 DIAGNOSIS — Z9884 Bariatric surgery status: Secondary | ICD-10-CM | POA: Insufficient documentation

## 2017-06-27 DIAGNOSIS — G8929 Other chronic pain: Secondary | ICD-10-CM | POA: Diagnosis not present

## 2017-06-27 DIAGNOSIS — Z881 Allergy status to other antibiotic agents status: Secondary | ICD-10-CM | POA: Diagnosis not present

## 2017-06-27 DIAGNOSIS — M5442 Lumbago with sciatica, left side: Secondary | ICD-10-CM

## 2017-06-27 DIAGNOSIS — Z7982 Long term (current) use of aspirin: Secondary | ICD-10-CM | POA: Diagnosis not present

## 2017-06-27 DIAGNOSIS — Z888 Allergy status to other drugs, medicaments and biological substances status: Secondary | ICD-10-CM | POA: Diagnosis not present

## 2017-06-27 DIAGNOSIS — M5388 Other specified dorsopathies, sacral and sacrococcygeal region: Secondary | ICD-10-CM

## 2017-06-27 DIAGNOSIS — M533 Sacrococcygeal disorders, not elsewhere classified: Secondary | ICD-10-CM | POA: Diagnosis not present

## 2017-06-27 DIAGNOSIS — M25559 Pain in unspecified hip: Secondary | ICD-10-CM

## 2017-06-27 DIAGNOSIS — Z9689 Presence of other specified functional implants: Secondary | ICD-10-CM | POA: Insufficient documentation

## 2017-06-27 DIAGNOSIS — M5441 Lumbago with sciatica, right side: Secondary | ICD-10-CM

## 2017-06-27 DIAGNOSIS — Z79899 Other long term (current) drug therapy: Secondary | ICD-10-CM | POA: Diagnosis not present

## 2017-06-27 MED ORDER — LACTATED RINGERS IV SOLN: 1000.0000 mL | Freq: Once | INTRAVENOUS | Status: DC

## 2017-06-27 MED ORDER — MIDAZOLAM HCL 5 MG/5ML IJ SOLN
1.0000 mg | INTRAMUSCULAR | Status: DC | PRN
  Filled 2017-06-27: qty 5

## 2017-06-27 MED ORDER — METHYLPREDNISOLONE ACETATE 40 MG/ML IJ SUSP
40.0000 mg | Freq: Once | INTRAMUSCULAR | Status: AC
  Administered 2017-06-27: 40 mg via INTRA_ARTICULAR
  Filled 2017-06-27: qty 1

## 2017-06-27 MED ORDER — FENTANYL CITRATE (PF) 100 MCG/2ML IJ SOLN
25.0000 ug | INTRAMUSCULAR | Status: DC | PRN
  Filled 2017-06-27: qty 2

## 2017-06-27 MED ORDER — LIDOCAINE HCL 2 % IJ SOLN
20.0000 mL | Freq: Once | INTRAMUSCULAR | Status: AC
  Administered 2017-06-27: 400 mg
  Filled 2017-06-27: qty 40

## 2017-06-27 MED ORDER — ROPIVACAINE HCL 2 MG/ML IJ SOLN
4.0000 mL | Freq: Once | INTRAMUSCULAR | Status: AC
  Administered 2017-06-27: 4 mL via INTRA_ARTICULAR
  Filled 2017-06-27: qty 10

## 2017-06-27 MED ORDER — LIDOCAINE HCL 2 % IJ SOLN
INTRAMUSCULAR | Status: AC
  Filled 2017-06-27: qty 20

## 2017-06-27 MED ORDER — IOPAMIDOL (ISOVUE-M 200) INJECTION 41%
10.0000 mL | Freq: Once | INTRAMUSCULAR | Status: AC
  Administered 2017-06-27: 10 mL via EPIDURAL
  Filled 2017-06-27: qty 10

## 2017-06-27 NOTE — Telephone Encounter (Signed)
Order sheet placed on Dr. Adalberto Cole desk, Leta Jungling notified.

## 2017-06-27 NOTE — Telephone Encounter (Signed)
The patient is scheduled for a RACZ procedure on  07/23/17 @ 0745.

## 2017-06-27 NOTE — Patient Instructions (Addendum)
____________________________________________________________________________________________  Post-Procedure Discharge Instructions  Instructions:  Apply ice: Fill a plastic sandwich bag with crushed ice. Cover it with a small towel and apply to injection site. Apply for 15 minutes then remove x 15 minutes. Repeat sequence on day of procedure, until you go to bed. The purpose is to minimize swelling and discomfort after procedure.  Apply heat: Apply heat to procedure site starting the day following the procedure. The purpose is to treat any soreness and discomfort from the procedure.  Food intake: Start with clear liquids (like water) and advance to regular food, as tolerated.   Physical activities: Keep activities to a minimum for the first 8 hours after the procedure.   Driving: If you have received any sedation, you are not allowed to drive for 24 hours after your procedure.  Blood thinner: Restart your blood thinner 6 hours after your procedure. (Only for those taking blood thinners)  Insulin: As soon as you can eat, you may resume your normal dosing schedule. (Only for those taking insulin)  Infection prevention: Keep procedure site clean and dry.  Post-procedure Pain Diary: Extremely important that this be done correctly and accurately. Recorded information will be used to determine the next step in treatment.  Pain evaluated is that of treated area only. Do not include pain from an untreated area.  Complete every hour, on the hour, for the initial 8 hours. Set an alarm to help you do this part accurately.  Do not go to sleep and have it completed later. It will not be accurate.  Follow-up appointment: Keep your follow-up appointment after the procedure. Usually 2 weeks for most procedures. (6 weeks in the case of radiofrequency.) Bring you pain diary.   Expect:  From numbing medicine (AKA: Local Anesthetics): Numbness or decrease in pain.  Onset: Full effect within 15  minutes of injected.  Duration: It will depend on the type of local anesthetic used. On the average, 1 to 8 hours.   From steroids: Decrease in swelling or inflammation. Once inflammation is improved, relief of the pain will follow.  Onset of benefits: Depends on the amount of swelling present. The more swelling, the longer it will take for the benefits to be seen. In some cases, up to 10 days.  Duration: Steroids will stay in the system x 2 weeks. Duration of benefits will depend on multiple posibilities including persistent irritating factors.  From procedure: Some discomfort is to be expected once the numbing medicine wears off. This should be minimal if ice and heat are applied as instructed.  Call if:  You experience numbness and weakness that gets worse with time, as opposed to wearing off.  New onset bowel or bladder incontinence. (This applies to Spinal procedures only)  Emergency Numbers:  [place] hours (Monday - Thursday, 8:00 AM - 4:00 PM) (Friday, 9:00 AM - 12:00 Noon): [phone]  After hours: [phone] ____________________________________________________________________________________________ Lysis of Epidural Adhesions Utilizing the Epidural Approach (RACZ Epidural Adhesiolysis)  Introduction Bleeding into the epidural space following surgery or leakage of disc material following breakage or a tear of a disc most commonly causes epidural scarring.  Presumably, inflammation and compression of nerve roots by epidural scar (Adhesions) are the mechanism of persistent pain following back surgery, ruptured or herniated discs, or vertebral body fracture.  Epidural scar may also contribute to the pain of spinal column metastatic carcinoma, failed facet joint syndrome, and unexplained neck or low back pain.   Diagnosis Conventional studies such as myelograms, computerized  tomography (CT Scans), and magnetic resonance imaging (MRI), are usually inadequate to  make the diagnosis.  Injection of contrast material (dye) into the epidural space yields an "epidurogram", which is diagnostic for the presence or absence of epidural scar tissue.   Procedure Since further surgery would only produce more scar tissue, a different approach to the problem needs to be taken.  "Lysis of Epidural Adhesions" (Breakage of scar tissue) is an alternative to this problem.  The procedure consists of introducing an epidural catheter (thin plastic tube) into the epidural space (space between your spinal cord and the walls of the spinal canal, within your backbone).  Once in place, medications are injected through this tubing in order to break the scar tissue.  Although the catheter is placed within fifteen to twenty minutes, it is kept in the epidural space for a couple of hours.  During this time, the injection of medications through the tubing is performed.  At the end of the procedure the catheter is removed and the patient discharged to home.   Conditions Usually Treated with This Modality Conditions include, but are not limited to: Marland Kitchen Failed Back Surgery Syndrome . Post-Laminectomy Syndrome . Epidural Adhesions . Back and leg pain . Sciatica . Radiculopathy . Neck and arm pain . Leg pain . Disc disruption . Traumatic vertebral body compression fracture . Degenerative arthritis of the spine . Facet pain . Epidural scarring following infection . Occipital neuralgia . Others  Side Effects and Possible Complications Everything in medicine is subject to side effects and possible complications.  No two patients are alike.  Side effects and complications are not the same or equivalent to malpractice.  Malpractice refers to an injury sustained by a patient, which occurs as a consequence of negligence in the practice of medicine.  Side effects and complications, on the other hand, are untoward events, which can occur and may injure a patient, in certain percentages of the  population.  These events can, and do occur even if everything goes according to plan, and in the absence of negligence or malpractice.  Possible side effects and complications of this procedure include, but are not limited to. . Pain or worsening of symptoms . Infection (local = abscess; or generalized = sepsis), including meningitis and death. Infection due to a steroid-induced immune system suppression. . Bleeding, including hematomas, which might compress the spinal cord, therefore causing paralysis. . Nerve damage, including sensory or motor weakness, and/or paralysis. Nerve damage, ranging from minor nerve irritation with pain, to major nerve damage with paralysis, impotence, urinary incontinence, and/or fecal incontinence. . Allergic reactions ranging from a minor rush to an anaphylactic reaction and death . Failure to relieve pain . Spinal cord compression leading to paralysis . Breakage of catheter (tubing) . Unforeseen events:   Guarantees That It Will Help None. There are no guarantees in medicine.   Results of Treatment 93.9% of patients will experience some pain relief, which will be variable.  6.1% will experience no relief.     Duration of Results Only 12.3% will experience persistent pain relief beyond 12 months.  57.9% of female patients and 64.4% of female patients will experience between 0-3 months of variable degrees of pain relief.   Frequently Asked Questions  What is the incidence of complications? It is unknown.  Most complications occur sporadically and are reported as isolated case reports.  Can the procedure be repeated when and if my pain returns? Yes.  The effectiveness of subsequent repeat procedures is  also variable.  Some patients obtain longer duration of pain relief while others obtain shorter duration.  Does this require that I be hospitalized? No.  On the average the procedure takes anywhere from 1 to 3 hours to complete.  What should I expect to feel  after the procedure? If the procedure is successful, you should experience pain relief of variable degrees.  Will I obtain complete (100%) pain relief? Although possible, it is our experience that patients suffering from chronic pain, the cause of the pain is multifactorial, and therefore, highly unlikely to completely address the problem with only one type of therapy.  What is an Epidurolysis (RACZ) Procedure? Epidurolysis (RACZ) Procedure is used to dissolve some of the scar tissue from around entrapped nerves in the Epidural space of spine, so that medications such as cortisone can reach the affected areas. Dr. Kari Baars pioneered this procedure.   What causes scarring (adhesions)? Scarring is most commonly caused from bleeding into the Epidural space following back surgery and the subsequent healing process. It is a natural occurrence following surgical intervention. Sometimes scarring can also occur when a disk ruptures and its contents leak out.   What is the purpose of it? To allow medications to reach affected nerves so that pain and other symptoms may be diminished.   How long does the procedure take?  First, a small catheter (surgical tube) is inserted in the Epidural space up to the area where the scar is located. This is done under sterile conditions using x-ray guidance. This epidural catheter is secured to the skin using tape and the patient is transported to the recovery room. This part may take 45 minutes. While in the recovery area, series of medications will be injected via the catheter. This part may take 1.0 to 2.0 hours. The patient is kept in the recovery room for the duration of the procedure. Once all medications are injected, then the catheter is removed. The actual injections only take a few minutes, however, the procedure protocol calls for certain periods of time to elapse between the administration of medications.   What is actually injected? The injection consists of  a mixture of a local anesthetic (numbing medicine), a steroid (anti-inflammatory medication), radiological contrast dye (to visualize the scar tissue), hyaluronidase (medicine to soften the scar), and a hyper-concentrated solution sterile salt water (to remove fluid from the swollen nerves via osmosis).   Will the injection hurt? Patients receive intravenous sedation and local infiltration of numbing medicine to minimize the discomfort from the procedure. Because the procedure involves the insertion of a needle, some discomfort is to be expected. In addition, because there is scar tissue and swelling inside of the epidural space, as the medications are injected, the sensation of pressure should be expected.   Will I be "put out" for this procedure? No. This procedure is done under local anesthesia and sedation, which makes the procedure easy to tolerate. The amount of sedation given generally depends upon the patient's tolerance. Communication with the patients during the procedure is essential to avoid complications.   How is the procedure performed? Catheter placement is done with the patient lying on their stomach. During the procedure, the patient is monitored with a heart monitor, blood pressure monitor, and a blood oxygen monitoring device. The skin in the back is cleaned with antiseptic solution and then the procedure is carried out. After the procedure, you are placed on your back or on your side. X-rays are used to assist in the  placement of the catheter and to perform the epidurogram (injection of radiological contrast to confirm adequate placement).   What should I expect after the injection? Immediately after the injection, you may feel your legs slightly heavy and numb. Also, you may notice that your pain may improve or disappear. This may only last for a few hours and it is due to the local anesthetics (numbing medicine).    When can I return to work? Unless there are any complications,  you should be able to return to work the next day. However, it may take a couple of days for you to get the full benefit of the procedure.   How long the effects of the medication last? The immediate effect is usually from the local anesthetic injected. This wears off in a few hours. The cortisone starts working in about 5 to 10 days and its effect can last for several days to a few months. The benefits from the procedure may last as long as 6 month.   How many times do I need to have this procedure performed? If the first procedure does not relieve your symptoms within one to two weeks, you may be recommended to have one more procedure. After this, we recommend 6 months between [facility] procedures.   Will the Epidurolysis (RACZ) Procedure help me? It is very difficult to predict if the procedure will indeed help you or not. Generally speaking, the patients who have recent scarring (e.g. following back surgery) respond better.    What are the risks and side effects? Generally speaking, this procedure is relatively safe. However, with any procedure there are risks and possible complications. These include, but are not limited to: spinal headaches; central nervous system infections such as meningitis, or epidural abscesses; epidural hematomas with possible spinal cord compression and subsequent permanent paralysis; worsening of symptoms; failure to provide relief; nerve damage; urinary or fecal incontinence; blood vessel damage, etc. Side effects related to the steroids, such as: weight gain; increase in blood sugar (mainly in diabetics); water retention; suppression of body's immune system; mood disturbances, etc.. Some of patients may have anaphylactic allergic reactions to hyaluronidase. Fortunately, the serious side effects and complications are uncommon.   Who should not have this procedure? If you are allergic to any of the medications to be injected, if you are on a blood thinning medication (e.i.  Coumadin), or if you have an active infection going on, you should not have the injection. The following are absolute contraindications for performing epidural adhesiolysis: 1. Sepsis 2. Chronic infection 3. Coagulopathy (Bleeding disorder) 4. Local infection at the procedure site 5. Patient refusal 6. Syrinx formation 7. A relative contraindication is the presence of arachnoiditis.  How do I prepare for the procedure? 1. Do not eat or drink for 6 hours before the procedure. 2. If you take blood pressure medication in the morning, take it as usual, with just a sip of water. 3. Take a shower the morning of the procedure using an antibacterial soap such as "Right Guard". 4. Take a 1000 mg Vitamin C every day, before and after the procedure to build up your immune system and decrease the risk of infections. 5. You may drink water up to 2 hours before the procedure. Empty your bladder before getting your IV started.Lysis of Epidural Adhesions Utilizing the Epidural Approach (RACZ Epidural Adhesiolysis)  Introduction Bleeding into the epidural space following surgery or leakage of disc material following breakage or a tear of a disc most commonly causes  epidural scarring.  Presumably, inflammation and compression of nerve roots by epidural scar (Adhesions) are the mechanism of persistent pain following back surgery, ruptured or herniated discs, or vertebral body fracture.  Epidural scar may also contribute to the pain of spinal column metastatic carcinoma, failed facet joint syndrome, and unexplained neck or low back pain.   Diagnosis Conventional studies such as myelograms, computerized tomography (CT Scans), and magnetic resonance imaging (MRI), are usually inadequate to make the diagnosis.  Injection of contrast material (dye) into the epidural space yields an "epidurogram", which is diagnostic for the presence or absence of epidural scar tissue.   Procedure Since further surgery would only  produce more scar tissue, a different approach to the problem needs to be taken.  "Lysis of Epidural Adhesions" (Breakage of scar tissue) is an alternative to this problem.  The procedure consists of introducing an epidural catheter (thin plastic tube) into the epidural space (space between your spinal cord and the walls of the spinal canal, within your backbone).  Once in place, medications are injected through this tubing in order to break the scar tissue.  Although the catheter is placed within fifteen to twenty minutes, it is kept in the epidural space for a couple of hours.  During this time, the injection of medications through the tubing is performed.  At the end of the procedure the catheter is removed and the patient discharged to home.   Conditions Usually Treated with This Modality Conditions include, but are not limited to: Failed Back Surgery Syndrome Post-Laminectomy Syndrome Epidural Adhesions Back and leg pain Sciatica Radiculopathy Neck and arm pain Leg pain Disc disruption Traumatic vertebral body compression fracture Degenerative arthritis of the spine Facet pain Epidural scarring following infection Occipital neuralgia Others  Side Effects and Possible Complications Everything in medicine is subject to side effects and possible complications.  No two patients are alike.  Side effects and complications are not the same or equivalent to malpractice.  Malpractice refers to an injury sustained by a patient, which occurs as a consequence of negligence in the practice of medicine.  Side effects and complications, on the other hand, are untoward events, which can occur and may injure a patient, in certain percentages of the population.  These events can, and do occur even if everything goes according to plan, and in the absence of negligence or malpractice.  Possible side effects and complications of this procedure include, but are not limited to. Pain or worsening of  symptoms Infection (local = abscess; or generalized = sepsis), including meningitis and death. Infection due to a steroid-induced immune system suppression. Bleeding, including hematomas, which might compress the spinal cord, therefore causing paralysis. Nerve damage, including sensory or motor weakness, and/or paralysis. Nerve damage, ranging from minor nerve irritation with pain, to major nerve damage with paralysis, impotence, urinary incontinence, and/or fecal incontinence. Allergic reactions ranging from a minor rush to an anaphylactic reaction and death Failure to relieve pain Spinal cord compression leading to paralysis Breakage of catheter (tubing) Unforeseen events:   Guarantees That It Will Help None. There are no guarantees in medicine.   Results of Treatment 93.9% of patients will experience some pain relief, which will be variable.  6.1% will experience no relief.     Duration of Results Only 12.3% will experience persistent pain relief beyond 12 months.  57.9% of female patients and 64.4% of female patients will experience between 0-3 months of variable degrees of pain relief.   Frequently Asked Questions  What  is the incidence of complications? It is unknown.  Most complications occur sporadically and are reported as isolated case reports.  Can the procedure be repeated when and if my pain returns? Yes.  The effectiveness of subsequent repeat procedures is also variable.  Some patients obtain longer duration of pain relief while others obtain shorter duration.  Does this require that I be hospitalized? No.  On the average the procedure takes anywhere from 1 to 3 hours to complete.  What should I expect to feel after the procedure? If the procedure is successful, you should experience pain relief of variable degrees.  Will I obtain complete (100%) pain relief? Although possible, it is our experience that patients suffering from chronic pain, the cause of the pain is  multifactorial, and therefore, highly unlikely to completely address the problem with only one type of therapy.  What is an Epidurolysis (RACZ) Procedure? Epidurolysis (RACZ) Procedure is used to dissolve some of the scar tissue from around entrapped nerves in the Epidural space of spine, so that medications such as cortisone can reach the affected areas. Dr. Kari Baars pioneered this procedure.   What causes scarring (adhesions)? Scarring is most commonly caused from bleeding into the Epidural space following back surgery and the subsequent healing process. It is a natural occurrence following surgical intervention. Sometimes scarring can also occur when a disk ruptures and its contents leak out.   What is the purpose of it? To allow medications to reach affected nerves so that pain and other symptoms may be diminished.   How long does the procedure take?  First, a small catheter (surgical tube) is inserted in the Epidural space up to the area where the scar is located. This is done under sterile conditions using x-ray guidance. This epidural catheter is secured to the skin using tape and the patient is transported to the recovery room. This part may take 45 minutes. While in the recovery area, series of medications will be injected via the catheter. This part may take 1.0 to 2.0 hours. The patient is kept in the recovery room for the duration of the procedure. Once all medications are injected, then the catheter is removed. The actual injections only take a few minutes, however, the procedure protocol calls for certain periods of time to elapse between the administration of medications.   What is actually injected? The injection consists of a mixture of a local anesthetic (numbing medicine), a steroid (anti-inflammatory medication), radiological contrast dye (to visualize the scar tissue), hyaluronidase (medicine to soften the scar), and a hyper-concentrated solution sterile salt water (to remove  fluid from the swollen nerves via osmosis).   Will the injection hurt? Patients receive intravenous sedation and local infiltration of numbing medicine to minimize the discomfort from the procedure. Because the procedure involves the insertion of a needle, some discomfort is to be expected. In addition, because there is scar tissue and swelling inside of the epidural space, as the medications are injected, the sensation of pressure should be expected.   Will I be "put out" for this procedure? No. This procedure is done under local anesthesia and sedation, which makes the procedure easy to tolerate. The amount of sedation given generally depends upon the patient's tolerance. Communication with the patients during the procedure is essential to avoid complications.   How is the procedure performed? Catheter placement is done with the patient lying on their stomach. During the procedure, the patient is monitored with a heart monitor, blood pressure monitor, and a  blood oxygen monitoring device. The skin in the back is cleaned with antiseptic solution and then the procedure is carried out. After the procedure, you are placed on your back or on your side. X-rays are used to assist in the placement of the catheter and to perform the epidurogram (injection of radiological contrast to confirm adequate placement).   What should I expect after the injection? Immediately after the injection, you may feel your legs slightly heavy and numb. Also, you may notice that your pain may improve or disappear. This may only last for a few hours and it is due to the local anesthetics (numbing medicine).    When can I return to work? Unless there are any complications, you should be able to return to work the next day. However, it may take a couple of days for you to get the full benefit of the procedure.   How long the effects of the medication last? The immediate effect is usually from the local anesthetic injected. This  wears off in a few hours. The cortisone starts working in about 5 to 10 days and its effect can last for several days to a few months. The benefits from the procedure may last as long as 6 month.   How many times do I need to have this procedure performed? If the first procedure does not relieve your symptoms within one to two weeks, you may be recommended to have one more procedure. After this, we recommend 6 months between [facility] procedures.   Will the Epidurolysis (RACZ) Procedure help me? It is very difficult to predict if the procedure will indeed help you or not. Generally speaking, the patients who have recent scarring (e.g. following back surgery) respond better.    What are the risks and side effects? Generally speaking, this procedure is relatively safe. However, with any procedure there are risks and possible complications. These include, but are not limited to: spinal headaches; central nervous system infections such as meningitis, or epidural abscesses; epidural hematomas with possible spinal cord compression and subsequent permanent paralysis; worsening of symptoms; failure to provide relief; nerve damage; urinary or fecal incontinence; blood vessel damage, etc. Side effects related to the steroids, such as: weight gain; increase in blood sugar (mainly in diabetics); water retention; suppression of body's immune system; mood disturbances, etc.. Some of patients may have anaphylactic allergic reactions to hyaluronidase. Fortunately, the serious side effects and complications are uncommon.   Who should not have this procedure? If you are allergic to any of the medications to be injected, if you are on a blood thinning medication (e.i. Coumadin), or if you have an active infection going on, you should not have the injection. The following are absolute contraindications for performing epidural adhesiolysis: Sepsis Chronic infection Coagulopathy (Bleeding disorder) Local infection at the  procedure site Patient refusal Syrinx formation A relative contraindication is the presence of arachnoiditis.  How do I prepare for the procedure? Do not eat or drink for 6 hours before the procedure. If you take blood pressure medication in the morning, take it as usual, with just a sip of water. Take a shower the morning of the procedure using an antibacterial soap such as "Right Guard". Take a 1000 mg Vitamin C every day, before and after the procedure to build up your immune system and decrease the risk of infections. You may drink water up to 2 hours before the procedure. 6. Empty your bladder before getting your IV started.

## 2017-06-27 NOTE — Progress Notes (Signed)
Safety precautions to be maintained throughout the outpatient stay will include: orient to surroundings, keep bed in low position, maintain call bell within reach at all times, provide assistance with transfer out of bed and ambulation.  

## 2017-06-28 NOTE — Telephone Encounter (Signed)
Called patient. States she is a little sore. Instructed to call if needed

## 2017-07-02 ENCOUNTER — Other Ambulatory Visit: Payer: Self-pay | Admitting: Family Medicine

## 2017-07-02 DIAGNOSIS — F4321 Adjustment disorder with depressed mood: Secondary | ICD-10-CM

## 2017-07-02 DIAGNOSIS — F4329 Adjustment disorder with other symptoms: Secondary | ICD-10-CM

## 2017-07-02 DIAGNOSIS — F4381 Prolonged grief disorder: Secondary | ICD-10-CM

## 2017-07-02 DIAGNOSIS — F4310 Post-traumatic stress disorder, unspecified: Secondary | ICD-10-CM

## 2017-07-04 ENCOUNTER — Ambulatory Visit: Payer: Medicare HMO | Admitting: Pain Medicine

## 2017-07-04 DIAGNOSIS — E669 Obesity, unspecified: Secondary | ICD-10-CM | POA: Diagnosis not present

## 2017-07-04 DIAGNOSIS — K219 Gastro-esophageal reflux disease without esophagitis: Secondary | ICD-10-CM | POA: Diagnosis not present

## 2017-07-04 DIAGNOSIS — R69 Illness, unspecified: Secondary | ICD-10-CM | POA: Diagnosis not present

## 2017-07-04 DIAGNOSIS — G629 Polyneuropathy, unspecified: Secondary | ICD-10-CM | POA: Diagnosis not present

## 2017-07-04 DIAGNOSIS — E785 Hyperlipidemia, unspecified: Secondary | ICD-10-CM | POA: Diagnosis not present

## 2017-07-04 DIAGNOSIS — G4700 Insomnia, unspecified: Secondary | ICD-10-CM | POA: Diagnosis not present

## 2017-07-04 DIAGNOSIS — H04129 Dry eye syndrome of unspecified lacrimal gland: Secondary | ICD-10-CM | POA: Diagnosis not present

## 2017-07-04 DIAGNOSIS — G8929 Other chronic pain: Secondary | ICD-10-CM | POA: Diagnosis not present

## 2017-07-04 DIAGNOSIS — J309 Allergic rhinitis, unspecified: Secondary | ICD-10-CM | POA: Diagnosis not present

## 2017-07-05 ENCOUNTER — Ambulatory Visit (INDEPENDENT_AMBULATORY_CARE_PROVIDER_SITE_OTHER): Payer: Medicare HMO | Admitting: Family Medicine

## 2017-07-05 ENCOUNTER — Encounter: Payer: Self-pay | Admitting: Family Medicine

## 2017-07-05 ENCOUNTER — Other Ambulatory Visit: Payer: Self-pay | Admitting: Family Medicine

## 2017-07-05 VITALS — BP 109/83 | HR 70 | Temp 98.0°F | Resp 16 | Ht 62.0 in | Wt 174.0 lb

## 2017-07-05 DIAGNOSIS — H60543 Acute eczematoid otitis externa, bilateral: Secondary | ICD-10-CM

## 2017-07-05 DIAGNOSIS — G43709 Chronic migraine without aura, not intractable, without status migrainosus: Secondary | ICD-10-CM | POA: Diagnosis not present

## 2017-07-05 DIAGNOSIS — H04123 Dry eye syndrome of bilateral lacrimal glands: Secondary | ICD-10-CM | POA: Diagnosis not present

## 2017-07-05 DIAGNOSIS — H26493 Other secondary cataract, bilateral: Secondary | ICD-10-CM | POA: Diagnosis not present

## 2017-07-05 DIAGNOSIS — M542 Cervicalgia: Secondary | ICD-10-CM | POA: Diagnosis not present

## 2017-07-05 DIAGNOSIS — E782 Mixed hyperlipidemia: Secondary | ICD-10-CM | POA: Diagnosis not present

## 2017-07-05 DIAGNOSIS — F4310 Post-traumatic stress disorder, unspecified: Secondary | ICD-10-CM | POA: Diagnosis not present

## 2017-07-05 DIAGNOSIS — G894 Chronic pain syndrome: Secondary | ICD-10-CM | POA: Diagnosis not present

## 2017-07-05 DIAGNOSIS — R69 Illness, unspecified: Secondary | ICD-10-CM | POA: Diagnosis not present

## 2017-07-05 DIAGNOSIS — H34211 Partial retinal artery occlusion, right eye: Secondary | ICD-10-CM | POA: Diagnosis not present

## 2017-07-05 DIAGNOSIS — G8929 Other chronic pain: Secondary | ICD-10-CM | POA: Diagnosis not present

## 2017-07-05 DIAGNOSIS — Z8619 Personal history of other infectious and parasitic diseases: Secondary | ICD-10-CM | POA: Diagnosis not present

## 2017-07-05 DIAGNOSIS — IMO0002 Reserved for concepts with insufficient information to code with codable children: Secondary | ICD-10-CM

## 2017-07-05 MED ORDER — HYDROCORTISONE-ACETIC ACID 1-2 % OT SOLN: 3.0000 [drp] | Freq: Three times a day (TID) | OTIC | 0 refills | Status: DC

## 2017-07-05 MED ORDER — BUTALBITAL-APAP-CAFFEINE 50-300-40 MG PO CAPS: 1.0000 | ORAL_CAPSULE | Freq: Every day | ORAL | 3 refills | Status: DC | PRN

## 2017-07-05 NOTE — Telephone Encounter (Signed)
Order sheet  For RACZ sent to pharmacy.

## 2017-07-05 NOTE — Progress Notes (Signed)
Subjective:    Patient ID: Ashley Fields, female    DOB: December 06, 1955, 62 y.o.   MRN: [id number]  Ashley Fields is a 62 y.o. female presenting on 07/05/2017 for Anxiety and Depression   HPI   FOLLOW-UP Adjustment Disorder w/ Depressed mood / PTSD / Anxiety Last visit 06/10/17 for same problem, initial visit after recent passing of her sister, see note for background HPI, she was started back on Abilify 5mg  daily, and continued on Sertraline, Trazodone. She was given contact info for self referrals to Psychiatry/Psychology and grief counseling. - Today reports her mood is improved on Abilify. She is handling recent loss better. See PHQ below - Not established with psychiatry - Due for Lorazepam refill, already requested prior to apt, due at 3 months now - Regarding sleep, still has insomnia but now doing better on Trazodone and Melatonin Reduced crying spells and mood lability Denies suicidal or homicidal ideation  Otitis Externa vs Psoriasis of Ears - Reports persistent problem with itching and irritation of outer ears and ear canals, has patchy flaky dry skin. In past has been treated with good results, now asking about medication to reduce dry itchy skin Denies hearing loss or ear pain  Chronic Pain Syndrome / Migraines Followed by [facility] - reviewed note Due for refill Fioricet for migraines PRN  S/p Gastric Bypass / Reduced absorption iron / vitamins / Iron Deficiency  Continues on iron supplement. She is asking about upcoming labs for iron anemia panel and vitamins.  History of Hep C s/p treatment She was previously followed by GI, last seen 01/2017 and advised not needed to return, she would need abdomen US liver surveillance q 6-12 months, due for this.  History of hollenhorst plaque / HLD Followed by Cardiology, on higher intensity statin now Requesting LFT and Lipid panel lab draw from Cardiology   Depression screen [facility] 2/9 07/05/2017 06/27/2017 06/17/2017    Decreased Interest 1 0 0  Down, Depressed, Hopeless 1 0 0  PHQ - 2 Score 2 0 0  Altered sleeping 3 - -  Tired, decreased energy 3 - -  Change in appetite 3 - -  Feeling bad or failure about yourself  0 - -  Trouble concentrating 1 - -  Moving slowly or fidgety/restless 1 - -  Suicidal thoughts 0 - -  PHQ-9 Score 13 - -  Difficult doing work/chores Somewhat difficult - -  Some recent data might be hidden   GAD 7 : Generalized Anxiety Score 07/05/2017 06/10/2017  Nervous, Anxious, on Edge 3 3  Control/stop worrying 1 2  Worry too much - different things 1 2  Trouble relaxing 1 2  Restless 1 1  Easily annoyed or irritable 2 2  Afraid - awful might happen 0 0  Total GAD 7 Score 9 12  Anxiety Difficulty Somewhat difficult Somewhat difficult    Social History   Tobacco Use  . Smoking status: Former Smoker    Years: 4.00  . Smokeless tobacco: Never Used  Substance Use Topics  . Alcohol use: No  . Drug use: No    Review of Systems Per HPI unless specifically indicated above     Objective:    BP 109/83   Pulse 70   Temp 98 F (36.7 C) (Oral)   Resp 16   Ht 5\' 2"  (1.575 m)   Wt 174 lb (78.9 kg)   BMI 31.83 kg/m   Wt Readings from Last 3 Encounters:  07/05/17 174 lb (  78.9 kg)  06/27/17 171 lb (77.6 kg)  06/17/17 167 lb (75.8 kg)    Physical Exam  Constitutional: She is oriented to person, place, and time. She appears well-developed and well-nourished. No distress.  Well-appearing, comfortable, cooperative  HENT:  Head: Normocephalic and atraumatic.  Mouth/Throat: Oropharynx is clear and moist.  Eyes: Conjunctivae are normal. Right eye exhibits no discharge. Left eye exhibits no discharge.  Cardiovascular: Normal rate.  Pulmonary/Chest: Effort normal.  Musculoskeletal: She exhibits no edema.  Neurological: She is alert and oriented to person, place, and time.  Skin: Skin is warm and dry. No rash noted. She is not diaphoretic. No erythema.  Psychiatric: She has  a normal mood and affect. Her behavior is normal.  Well groomed, good eye contact, normal speech and thoughts. Mood is improved.  Nursing note and vitals reviewed.      Assessment & Plan:   Problem List Items Addressed This Visit    Chronic neck pain  (Chronic) Underlying etiology for trigger chronic migraines Refill Fioricet    Relevant Medications   Butalbital-APAP-Caffeine 50-300-40 MG CAPS   Chronic pain syndrome (Chronic) Followed by [facility]    History of hepatitis C S/p treatment No longer followed by [facility] For routine surveillance will check LFTs and RUQ Abd [place] at [facility] - to be scheduled - will check q 12 months    Hollenhorst plaque, right eye F/u with Cardiology regarding statin dosing Check lipid panel as requested, cancel prior order - will forward result to Dr Rockey Situ [facility]    Relevant Medications   Butalbital-APAP-Caffeine 50-300-40 MG CAPS    Other Visit Diagnoses    Acute eczematoid otitis externa of both ears    -  Primary Consistent on exam with otitis externa eczema vs psoriasis Trial acetic acid-hydrocortisone ear drops If ineffective can try topical triamcinolone only on external ear    Relevant Medications   acetic acid-hydrocortisone (VOSOL-HC) OTIC solution   Chronic migraine       Relevant Medications   Butalbital-APAP-Caffeine 50-300-40 MG CAPS      # PTSD / Depression Improved Continue Abilify Follow-up with Psych in future if not improving, encourage counseling  # S/p Gastric Bypass / Anemia Nutrition Deficiency Will check upcoming labs for annual physical as requested for yearly check  Meds ordered this encounter  Medications  . acetic acid-hydrocortisone (VOSOL-HC) OTIC solution    Sig: Place 3 drops into both ears 3 (three) times daily.    Dispense:  10 mL    Refill:  0  . Butalbital-APAP-Caffeine 50-300-40 MG CAPS    Sig: Take 1 capsule by mouth daily as needed. PRN headache    Dispense:  90 capsule     Refill:  3   Orders Placed This Encounter  Procedures  . US Abdomen Limited RUQ    Standing Status:   Future    Standing Expiration Date:   09/06/2018    Order Specific Question:   Reason for Exam (SYMPTOM  OR DIAGNOSIS REQUIRED)    Answer:   Surveillance for Liver s/p Hepatitis C treatment every 12 months    Order Specific Question:   Preferred imaging location?    Answer:   [facility]    Follow up plan: Return in about 5 weeks (around 08/09/2017) for Annual Physical.  Future labs ordered for 07/30/17  Nobie Putnam, [facility] 07/05/2017, 6:24 PM

## 2017-07-05 NOTE — Patient Instructions (Addendum)
Thank you for coming to the office today.  Ordered all blood tests as requested - including liver cholesterol check from Dr Rockey Situ will be drawn in 3 weeks  Ultrasound of Abdomen for Liver checkup will be at [facility] - they call with scheduling I recommend yearly 12 months  Refilled Fioricet medication, ear drops for psoriasis/eczema - printed  Colon Cancer Screening: - For all adults age 61+ routine colon cancer screening is highly recommended.     - Recent guidelines from [place] recommend starting age of 65 - Early detection of colon cancer is important, because often there are no warning signs or symptoms, also if found early usually it can be cured. Late stage is hard to treat.  - If you are not interested in Colonoscopy screening (if done and normal you could be cleared for 5 to 10 years until next due), then Cologuard is an excellent alternative for screening test for Colon Cancer. It is highly sensitive for detecting DNA of colon cancer from even the earliest stages. Also, there is NO bowel prep required. - If Cologuard is NEGATIVE, then it is good for 3 years before next due - If Cologuard is POSITIVE, then it is strongly advised to get a Colonoscopy, which allows the GI doctor to locate the source of the cancer or polyp (even very early stage) and treat it by removing it. ------------------------- If you would like to proceed with Cologuard (stool DNA test) - FIRST, call your insurance company and tell them you want to check cost of Cologuard tell them CPT Code [id number] (it may be completely covered and you could get for no cost, OR max cost without any coverage is about $600). Also, keep in mind if you do NOT open the kit, and decide not to do the test, you will NOT be charged, you should contact the company if you decide not to do the test. - If you want to proceed, you can notify us (phone message, [place], or at next visit) and we will order it for you. The  test kit will be delivered to you house within about 1 week. Follow instructions to collect sample, you may call the company for any help or questions, 24/7 telephone support at [phone].    DUE for FASTING BLOOD WORK (no food or drink after midnight before the lab appointment, only water or coffee without cream/sugar on the morning of)  SCHEDULE "Lab Only" visit in the morning at the clinic for lab draw in 3 WEEKS   - Make sure Lab Only appointment is at about 1 week before your next appointment, so that results will be available  For Lab Results, once available within 2-3 days of blood draw, you can can log in to MyChart online to view your results and a brief explanation. Also, we can discuss results at next follow-up visit.   Please schedule a Follow-up Appointment to: Return in about 5 weeks (around 08/09/2017) for Annual Physical.  If you have any other questions or concerns, please feel free to call the office or send a message through [place]. You may also schedule an earlier appointment if necessary.  Additionally, you may be receiving a survey about your experience at our office within a few days to 1 week by e-mail or mail. We value your feedback.  Nobie Putnam, DO [place]

## 2017-07-06 ENCOUNTER — Other Ambulatory Visit: Payer: Self-pay | Admitting: Family Medicine

## 2017-07-06 DIAGNOSIS — G894 Chronic pain syndrome: Secondary | ICD-10-CM

## 2017-07-06 DIAGNOSIS — Z9884 Bariatric surgery status: Secondary | ICD-10-CM | POA: Insufficient documentation

## 2017-07-06 DIAGNOSIS — H34211 Partial retinal artery occlusion, right eye: Secondary | ICD-10-CM

## 2017-07-06 DIAGNOSIS — E782 Mixed hyperlipidemia: Secondary | ICD-10-CM

## 2017-07-06 DIAGNOSIS — E559 Vitamin D deficiency, unspecified: Secondary | ICD-10-CM

## 2017-07-06 DIAGNOSIS — R7309 Other abnormal glucose: Secondary | ICD-10-CM

## 2017-07-06 DIAGNOSIS — Z8619 Personal history of other infectious and parasitic diseases: Secondary | ICD-10-CM

## 2017-07-06 DIAGNOSIS — K909 Intestinal malabsorption, unspecified: Secondary | ICD-10-CM | POA: Insufficient documentation

## 2017-07-06 DIAGNOSIS — Z0000 Encounter for general adult medical examination without abnormal findings: Secondary | ICD-10-CM

## 2017-07-06 DIAGNOSIS — F4310 Post-traumatic stress disorder, unspecified: Secondary | ICD-10-CM

## 2017-07-06 DIAGNOSIS — R799 Abnormal finding of blood chemistry, unspecified: Secondary | ICD-10-CM

## 2017-07-06 DIAGNOSIS — E538 Deficiency of other specified B group vitamins: Secondary | ICD-10-CM

## 2017-07-06 DIAGNOSIS — Z79899 Other long term (current) drug therapy: Secondary | ICD-10-CM

## 2017-07-08 ENCOUNTER — Encounter: Payer: Self-pay | Admitting: Family Medicine

## 2017-07-08 ENCOUNTER — Encounter: Payer: Self-pay | Admitting: Cardiovascular Disease

## 2017-07-08 DIAGNOSIS — Z1211 Encounter for screening for malignant neoplasm of colon: Secondary | ICD-10-CM

## 2017-07-10 ENCOUNTER — Telehealth: Payer: Self-pay | Admitting: Family Medicine

## 2017-07-10 DIAGNOSIS — H60543 Acute eczematoid otitis externa, bilateral: Secondary | ICD-10-CM

## 2017-07-10 MED ORDER — TRIAMCINOLONE ACETONIDE 0.5 % EX OINT: 1.0000 "application " | TOPICAL_OINTMENT | Freq: Two times a day (BID) | CUTANEOUS | 0 refills | Status: DC

## 2017-07-10 MED ORDER — NEOMYCIN-POLYMYXIN-HC 3.5-10000-1 OT SOLN: 2.0000 [drp] | Freq: Two times a day (BID) | OTIC | 0 refills | Status: DC

## 2017-07-10 NOTE — Telephone Encounter (Signed)
[facility] in [place] called said that the ear drop  Medication was on back order until the end of the month.Pharmacy phone # [phone] ........... Fax # [phone] to  pharmcy

## 2017-07-10 NOTE — Telephone Encounter (Signed)
Called patient, could not reach. Called pharmacy spoke to pharmacist and verbally switched to instock ear drop polytrim + hydrocortisone 2 drops twice daily both ears and also add external triamcinolone ointment 0.5% topical external ear twice daily - they will notify patient of changes  Ashley Fields, [facility] 07/10/2017, 5:25 PM

## 2017-07-11 ENCOUNTER — Encounter: Payer: Self-pay | Admitting: Family Medicine

## 2017-07-11 ENCOUNTER — Encounter: Payer: Self-pay | Admitting: Pain Medicine

## 2017-07-11 ENCOUNTER — Encounter: Payer: Self-pay | Admitting: Cardiovascular Disease

## 2017-07-12 ENCOUNTER — Emergency Department
Admission: EM | Admit: 2017-07-12 | Discharge: 2017-07-12 | Disposition: A | Discharge status: Home / Self Care | Payer: Medicare HMO | Attending: Emergency Medicine | Admitting: Emergency Medicine

## 2017-07-12 ENCOUNTER — Encounter: Payer: Self-pay | Admitting: Family Medicine

## 2017-07-12 ENCOUNTER — Other Ambulatory Visit: Payer: Self-pay

## 2017-07-12 ENCOUNTER — Emergency Department: Payer: Medicare HMO

## 2017-07-12 ENCOUNTER — Other Ambulatory Visit: Payer: Self-pay | Admitting: *Deleted

## 2017-07-12 DIAGNOSIS — Z9049 Acquired absence of other specified parts of digestive tract: Secondary | ICD-10-CM | POA: Diagnosis not present

## 2017-07-12 DIAGNOSIS — Z87891 Personal history of nicotine dependence: Secondary | ICD-10-CM | POA: Insufficient documentation

## 2017-07-12 DIAGNOSIS — N189 Chronic kidney disease, unspecified: Secondary | ICD-10-CM | POA: Insufficient documentation

## 2017-07-12 DIAGNOSIS — Y92019 Unspecified place in single-family (private) house as the place of occurrence of the external cause: Secondary | ICD-10-CM | POA: Insufficient documentation

## 2017-07-12 DIAGNOSIS — T148XXA Other injury of unspecified body region, initial encounter: Secondary | ICD-10-CM

## 2017-07-12 DIAGNOSIS — S99922A Unspecified injury of left foot, initial encounter: Secondary | ICD-10-CM | POA: Diagnosis present

## 2017-07-12 DIAGNOSIS — F329 Major depressive disorder, single episode, unspecified: Secondary | ICD-10-CM | POA: Insufficient documentation

## 2017-07-12 DIAGNOSIS — W228XXA Striking against or struck by other objects, initial encounter: Secondary | ICD-10-CM | POA: Diagnosis not present

## 2017-07-12 DIAGNOSIS — Y998 Other external cause status: Secondary | ICD-10-CM | POA: Diagnosis not present

## 2017-07-12 DIAGNOSIS — Z79899 Other long term (current) drug therapy: Secondary | ICD-10-CM | POA: Insufficient documentation

## 2017-07-12 DIAGNOSIS — Y9389 Activity, other specified: Secondary | ICD-10-CM | POA: Diagnosis not present

## 2017-07-12 DIAGNOSIS — S91342A Puncture wound with foreign body, left foot, initial encounter: Secondary | ICD-10-CM | POA: Diagnosis not present

## 2017-07-12 DIAGNOSIS — Z9884 Bariatric surgery status: Secondary | ICD-10-CM | POA: Insufficient documentation

## 2017-07-12 DIAGNOSIS — Z23 Encounter for immunization: Secondary | ICD-10-CM | POA: Diagnosis not present

## 2017-07-12 DIAGNOSIS — S91332A Puncture wound without foreign body, left foot, initial encounter: Secondary | ICD-10-CM | POA: Insufficient documentation

## 2017-07-12 DIAGNOSIS — R69 Illness, unspecified: Secondary | ICD-10-CM | POA: Diagnosis not present

## 2017-07-12 DIAGNOSIS — Z7982 Long term (current) use of aspirin: Secondary | ICD-10-CM | POA: Diagnosis not present

## 2017-07-12 MED ORDER — AMOXICILLIN-POT CLAVULANATE 875-125 MG PO TABS
1.0000 | ORAL_TABLET | Freq: Once | ORAL | Status: AC
  Administered 2017-07-12: 1 via ORAL
  Filled 2017-07-12: qty 1

## 2017-07-12 MED ORDER — OXYCODONE-ACETAMINOPHEN 5-325 MG PO TABS
1.0000 | ORAL_TABLET | Freq: Once | ORAL | Status: AC
  Administered 2017-07-12: 1 via ORAL
  Filled 2017-07-12: qty 1

## 2017-07-12 MED ORDER — AMOXICILLIN-POT CLAVULANATE 875-125 MG PO TABS: 1.0000 | ORAL_TABLET | Freq: Two times a day (BID) | ORAL | 0 refills | Status: DC

## 2017-07-12 MED ORDER — TETANUS-DIPHTH-ACELL PERTUSSIS 5-2.5-18.5 LF-MCG/0.5 IM SUSP
0.5000 mL | Freq: Once | INTRAMUSCULAR | Status: AC
  Administered 2017-07-12: 0.5 mL via INTRAMUSCULAR
  Filled 2017-07-12: qty 0.5

## 2017-07-12 NOTE — ED Triage Notes (Signed)
Pt states she stepped on a rake at 2030. Pt with two puncture wounds noted left bottom of foot. Bleeding controlled

## 2017-07-12 NOTE — ED Provider Notes (Signed)
[facility] Emergency Department Provider Note  ____________________________________________  Time seen: Approximately 9:55 PM  I have reviewed the triage vital signs and the nursing notes.   HISTORY  Chief Complaint Laceration    HPI Ashley Fields is a 62 y.o. female who presents emergency department complaining of left foot injury.  Patient reports that she was working on her house, stepped on a rake and sustained 2 puncture wounds to the plantar aspect of the left foot.  Patient reports that she does not know when her last tetanus shot was.  She thoroughly irrigated the area, soap and water, apply Neosporin dressing.  Bleeding was controlled with direct pressure.  Patient was concerned that she may need antibiotics as well as an updated tetanus shot.  No other injury or complaint at this time.    Past Medical History:  Diagnosis Date  . Allergy   . Anemia   . Chronic kidney disease   . Depression   . Disp fx of cuboid bone of right foot, init for clos fx 01/01/2017  . GERD (gastroesophageal reflux disease)   . Headache   . Hepatitis C   . Hyperlipidemia   . Osteoporosis   . Thyroid disease   . Urine incontinence     Patient Active Problem List   Diagnosis Date Noted  . Abnormal intestinal absorption 07/06/2017  . S/P gastric bypass 07/06/2017  . Other specified dorsopathies, sacral and sacrococcygeal region 06/17/2017  . Neurogenic pain 06/17/2017  . Family history of coronary artery disease 05/20/2017  . Carotid bruit 05/20/2017  . Mixed hyperlipidemia 05/20/2017  . Smoker 05/20/2017  . Hollenhorst plaque, right eye 05/14/2017  . GERD (gastroesophageal reflux disease) 04/17/2017  . Coccygodynia 04/17/2017  . PTSD (post-traumatic stress disorder) 04/04/2017  . Lumbar spinal stenosis (severe) (L3-4) 03/26/2017  . Lumbar lateral recess stenosis (Bilateral) (L3-4) 03/26/2017  . Lumbar foraminal stenosis (L3-4) (Left) 03/26/2017  . Hepatic  steatosis 03/21/2017  . History of nephrolithiasis 03/21/2017  . History of hepatitis C 03/15/2017  . Lumbar facet osteoarthritis (Bilateral) 10/25/2016  . DDD (degenerative disc disease), lumbar 10/25/2016  . Lumbar Facet Syndrome (Bilateral) (R>L) 10/15/2016  . Grade 1 Anterolisthesis of L3 over L4 09/28/2016  . 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 Chronic shoulder pain (Left) 09/28/2016  . History of cervical fusion (ACDF C4-C7) 09/28/2016  . DDD (degenerative disc disease), cervical 09/28/2016  . Chronic sacroiliac joint pain (Bilateral) (R>L) 09/28/2016  . Osteoarthritis of hip (Bilateral) 09/18/2016  . Chronic Low back pain (Primary Area of Pain) (Bilateral)  (R>L) 09/18/2016  . Chronic lower extremity pain [facility] Area of Pain) (Bilateral) (L>R) 09/18/2016  . Long term prescription benzodiazepine use 09/18/2016  . Chronic knee pain (Left) 08/20/2016  . Chronic pain syndrome 08/14/2016  . Chronic neck pain  08/14/2016  . Chronic hip pain (Secondary Area of Pain) (Bilateral) (L>R) 08/14/2016  . Long term current use of opiate analgesic 08/14/2016  . Long term prescription opiate use 08/14/2016  . Opiate use 08/14/2016    Past Surgical History:  Procedure Laterality Date  . 5 miscarriages     1977-1985, Blood transfusion s/p miscarriage 1977  . ABDOMINAL HYSTERECTOMY  1987   Total  . APPENDECTOMY  12/2008  . AUGMENTATION MAMMAPLASTY Bilateral 2015   Bilat  . AUGMENTATION MAMMAPLASTY Bilateral 2018   implants redone w/ placement of implants under muscle  . breast lift bilateral, implants  08/65/7846   bilateral, silicon naturel  . BREAST REDUCTION SURGERY Bilateral 1997  .  CESAREAN SECTION  07/27/1981   Placenta Previa  . CHOLECYSTECTOMY  03/2004   Lap surgery  . GASTRIC BYPASS  2005   Laparoscopic  . IVC FILTER INSERTION  12/2003   TrapEase Vena Cava Filter  . LUMBAR LAMINECTOMY  06/2006   L4-L5 (spinal fusion)  . mini tummy tuck  05/07/2016   Bilateral  bra/back roll lift skin removal  . neck surgery C3-C7  02/10/2015   ACDF  . REDUCTION MAMMAPLASTY Bilateral 1997  . SHOULDER ACROMIOPLASTY Left 03/27/2013   w/ labral debridement  . SPINAL CORD STIMULATOR IMPLANT  11/2010   removed 05/2011    Prior to Admission medications   Medication Sig Start Date End Date Taking? Authorizing Provider  amoxicillin-clavulanate (AUGMENTIN) 875-125 MG tablet Take 1 tablet by mouth 2 (two) times daily. 07/12/17   Arber Wiemers, Roderic Palau D, PA-C  ARIPiprazole (ABILIFY) 5 MG tablet TAKE 1 TABLET BY MOUTH EVERY DAY 07/02/17   Parks Ranger, Devonne Doughty, DO  aspirin EC 81 MG tablet Take 1 tablet (81 mg total) by mouth daily. 05/15/17   Karamalegos, Devonne Doughty, DO  Biotin 10000 MCG TABS Take 10,000 mcg by mouth 1 day or 1 dose.    [provider]  Butalbital-APAP-Caffeine 50-300-40 MG CAPS Take 1 capsule by mouth daily as needed. PRN headache 07/05/17   Olin Hauser, DO  cholecalciferol (VITAMIN D) 1000 units tablet Take 1,000 Units by mouth daily.    [provider]  Difluprednate (DUREZOL) 0.05 % EMUL Place into the left eye daily.    [provider]  estradiol (ESTRACE) 1 MG tablet Take 1 mg by mouth daily.    [provider]  ezetimibe (ZETIA) 10 MG tablet Take 1 tablet (10 mg total) by mouth daily. 06/10/17 09/08/17  Minna Merritts, MD  ferrous sulfate 325 (65 FE) MG tablet Take 325 mg by mouth daily.    [provider]  fluticasone (FLONASE) 50 MCG/ACT nasal spray 1 SPRAY IN EACH NOSTRIL ONCE A DAY NASALLY 30 DAYS as needed 07/04/16   [provider]  gabapentin (NEURONTIN) 800 MG tablet Take 1 tablet (800 mg total) by mouth 3 (three) times daily. 06/17/17 09/15/17  Milinda Pointer, MD  LORazepam (ATIVAN) 0.5 MG tablet TAKE 1 TABLET (0.5 MG TOTAL) BY MOUTH 2 (TWO) TIMES DAILY AS NEEDED FOR ANXIETY. 07/05/17   Parks Ranger, Devonne Doughty, DO  Magnesium 500 MG CAPS Take 1 capsule (500 mg total) by mouth 2 (two)  times daily at 8 am and 10 pm. 04/17/17 10/14/17  Milinda Pointer, MD  Melatonin 10 MG CAPS Take 1 capsule by mouth at bedtime.    [provider]  Multiple Vitamin (MULTIVITAMIN WITH MINERALS) TABS tablet Take 1 tablet by mouth daily.    [provider]  neomycin-polymyxin-hydrocortisone (CORTISPORIN) OTIC solution Place 2 drops into both ears 2 (two) times daily. 07/10/17   Karamalegos, Devonne Doughty, DO  omeprazole (PRILOSEC) 40 MG capsule Take 40 mg by mouth daily. 07/06/16   [provider]  ondansetron (ZOFRAN ODT) 4 MG disintegrating tablet Take 1 tablet (4 mg total) by mouth every 8 (eight) hours as needed for nausea or vomiting. 03/15/17   Karamalegos, Devonne Doughty, DO  rosuvastatin (CRESTOR) 40 MG tablet Take 1 tablet (40 mg total) by mouth daily. 06/10/17 09/08/17  Minna Merritts, MD  sertraline (ZOLOFT) 100 MG tablet Take 1 tablet (100 mg total) by mouth 2 (two) times daily. 04/04/17   Karamalegos, Devonne Doughty, DO  tiZANidine (ZANAFLEX) 4 MG tablet  Take 1 tablet (4 mg total) by mouth 3 (three) times daily. 05/09/17 07/08/17  Vevelyn Francois, NP  traMADol (ULTRAM) 50 MG tablet Take 1 tablet (50 mg total) by mouth every 6 (six) hours as needed for severe pain. Patient taking differently: Take 50 mg by mouth every 4 (four) hours as needed for severe pain.  05/15/17 07/14/17  Vevelyn Francois, NP  traZODone (DESYREL) 150 MG tablet Take 150 mg by mouth at bedtime. 09/04/16   [provider]  triamcinolone ointment (KENALOG) 0.5 % Apply 1 application topically 2 (two) times daily. Apply to external ear for 2 weeks 07/10/17   Olin Hauser, DO  Turmeric 500 MG CAPS Take 2 capsules by mouth daily.    [provider]  vitamin B-12 (CYANOCOBALAMIN) 500 MCG tablet Take 500 mcg by mouth daily.    [provider]  vitamin E 100 UNIT capsule Take by mouth daily.    [provider]    Allergies Flagyl [metronidazole] and Tape  Family History   Problem Relation Age of Onset  . COPD Mother   . Lung cancer Mother   . Diabetes Mother   . Heart disease Mother   . Stroke Mother   . Heart attack Mother   . Heart disease Father   . Heart attack Father 62  . Depression Sister        x 5 sisters  . Heart disease Brother        x 3 brothers  . Diabetes Brother   . Colon polyps Sister   . Heart attack Sister   . Heart attack Brother   . Breast cancer Neg Hx     Social History Social History   Tobacco Use  . Smoking status: Former Smoker    Years: 4.00  . Smokeless tobacco: Never Used  Substance Use Topics  . Alcohol use: No  . Drug use: No     Review of Systems  Constitutional: No fever/chills Eyes: No visual changes.  Cardiovascular: no chest pain. Respiratory: no cough. No SOB. Gastrointestinal: No abdominal pain.  No nausea, no vomiting.   Musculoskeletal: Stepped on a frequent left foot, puncture wounds to the plantar foot Skin: Negative for rash, abrasions, lacerations, ecchymosis. Neurological: Negative for headaches, focal weakness or numbness. 10-point ROS otherwise negative.  ____________________________________________   PHYSICAL EXAM:  VITAL SIGNS: ED Triage Vitals  Enc Vitals Group     BP 07/12/17 2120 120/70     Pulse Rate 07/12/17 2120 (!) 59     Resp 07/12/17 2120 16     Temp 07/12/17 2120 97.8 F (36.6 C)     Temp src --      SpO2 07/12/17 2120 98 %     Weight 07/12/17 2121 168 lb (76.2 kg)     Height 07/12/17 2121 5\' 2"  (1.575 m)     Head Circumference --      Peak Flow --      Pain Score 07/12/17 2120 7     Pain Loc --      Pain Edu? --      Excl. in [place]? --      Constitutional: Alert and oriented. Well appearing and in no acute distress. Eyes: Conjunctivae are normal. PERRL. EOMI. Head: Atraumatic. Neck: No stridor.    Cardiovascular: Normal rate, regular rhythm. Normal S1 and S2.  Good peripheral circulation. Respiratory: Normal respiratory effort without tachypnea or  retractions. Lungs CTAB. Good air entry to the bases with  no decreased or absent breath sounds. Musculoskeletal: Full range of motion to all extremities. No gross deformities appreciated.  Visualization of the plantar aspect of the left foot reveals 2 punctate lesions with no bleeding or foreign body appreciated.  No other visible abnormality to the plantar foot.  Full range of motion to the left ankle in all digits left foot.  Sensation intact all 5 digits.  Capillary refill less than 2 seconds all digits. Neurologic:  Normal speech and language. No gross focal neurologic deficits are appreciated.  Skin:  Skin is warm, dry and intact. No rash noted. Psychiatric: Mood and affect are normal. Speech and behavior are normal. Patient exhibits appropriate insight and judgement.   ____________________________________________   LABS (all labs ordered are listed, but only abnormal results are displayed)  Labs Reviewed - No data to display ____________________________________________  EKG   ____________________________________________  RADIOLOGY I personally viewed and evaluated these images as part of my medical decision making, as well as reviewing the written report by the radiologist.  Concur with radiologist finding of no acute foreign body in the left foot.  Dg Foot Complete Left  Result Date: 07/12/2017 CLINICAL DATA:  Stepped on the rake puncture wound EXAM: LEFT FOOT - COMPLETE 3+ VIEW COMPARISON:  None. FINDINGS: No fracture or malalignment. No radiopaque foreign body in the soft tissues. Small plantar calcaneal spur and posterior enthesophyte. IMPRESSION: No acute osseous abnormality. Electronically Signed   By: Donavan Foil M.D.   On: 07/12/2017 22:22    ____________________________________________    PROCEDURES  Procedure(s) performed:    Procedures    Medications  Tdap (BOOSTRIX) injection 0.5 mL (0.5 mLs Intramuscular Given 07/12/17 2220)  oxyCODONE-acetaminophen  (PERCOCET/ROXICET) 5-325 MG per tablet 1 tablet (1 tablet Oral Given 07/12/17 2243)  amoxicillin-clavulanate (AUGMENTIN) 875-125 MG per tablet 1 tablet (1 tablet Oral Given 07/12/17 2243)     ____________________________________________   INITIAL IMPRESSION / ASSESSMENT AND PLAN / ED COURSE  Pertinent labs & imaging results that were available during my care of the patient were reviewed by me and considered in my medical decision making (see chart for details).  Review of the [place] CSRS was performed in accordance of the [place] prior to dispensing any controlled drugs.      Patient's diagnosis is consistent with a puncture wound to the left foot.  Patient presents emergency department complaining of punctures to the left foot after stepping on a metal rake.  Exam was overall reassuring.  X-ray reveals no retained foreign body.  Area was thoroughly cleansed using Betadine soaked.  Area was then dressed prior to discharge.  Wound care instructions discussed with patient.  Given the nature of injury, patient will be placed on antibiotics prophylactically.  Tetanus shot is updated tonight.. Patient will be discharged home with prescriptions for Augmentin. Patient is to follow up with [facility] care as needed or otherwise directed. Patient is given ED precautions to return to the ED for any worsening or new symptoms.     ____________________________________________  FINAL CLINICAL IMPRESSION(S) / ED DIAGNOSES  Final diagnoses:  Puncture wound      NEW MEDICATIONS STARTED DURING THIS VISIT:  ED Discharge Orders        Ordered    amoxicillin-clavulanate (AUGMENTIN) 875-125 MG tablet  2 times daily     07/12/17 2253          This chart was dictated using voice recognition software/Dragon. Despite best efforts to proofread, errors can occur which can change the meaning.  Any change was purely unintentional.    Darletta Moll, PA-C 07/12/17 2304    Earleen Newport,  MD 07/13/17 [phone]

## 2017-07-12 NOTE — ED Notes (Signed)
No peripheral IV placed this visit.    Discharge instructions reviewed with patient. Questions fielded by this RN. Patient verbalizes understanding of instructions. Patient discharged home in stable condition per provider. No acute distress noted at time of discharge.    

## 2017-07-14 ENCOUNTER — Encounter: Payer: Self-pay | Admitting: Pain Medicine

## 2017-07-15 ENCOUNTER — Encounter: Payer: Self-pay | Admitting: Family Medicine

## 2017-07-15 ENCOUNTER — Ambulatory Visit: Payer: Medicare HMO | Attending: Nurse Practitioner | Admitting: Nurse Practitioner

## 2017-07-15 ENCOUNTER — Encounter: Payer: Self-pay | Admitting: Nurse Practitioner

## 2017-07-15 ENCOUNTER — Ambulatory Visit (INDEPENDENT_AMBULATORY_CARE_PROVIDER_SITE_OTHER): Payer: Medicare HMO | Admitting: Family Medicine

## 2017-07-15 ENCOUNTER — Other Ambulatory Visit: Payer: Self-pay

## 2017-07-15 ENCOUNTER — Telehealth: Payer: Self-pay | Admitting: Nurse Practitioner

## 2017-07-15 VITALS — BP 100/54 | HR 74 | Temp 99.0°F | Resp 16 | Ht 62.0 in | Wt 174.0 lb

## 2017-07-15 VITALS — BP 114/68 | HR 67 | Temp 98.1°F | Resp 18 | Ht 62.0 in | Wt 171.0 lb

## 2017-07-15 DIAGNOSIS — M545 Low back pain: Secondary | ICD-10-CM | POA: Insufficient documentation

## 2017-07-15 DIAGNOSIS — S91332A Puncture wound without foreign body, left foot, initial encounter: Secondary | ICD-10-CM

## 2017-07-15 DIAGNOSIS — M160 Bilateral primary osteoarthritis of hip: Secondary | ICD-10-CM | POA: Diagnosis not present

## 2017-07-15 DIAGNOSIS — Z79899 Other long term (current) drug therapy: Secondary | ICD-10-CM | POA: Diagnosis not present

## 2017-07-15 DIAGNOSIS — L03116 Cellulitis of left lower limb: Secondary | ICD-10-CM

## 2017-07-15 DIAGNOSIS — F4310 Post-traumatic stress disorder, unspecified: Secondary | ICD-10-CM | POA: Insufficient documentation

## 2017-07-15 DIAGNOSIS — B379 Candidiasis, unspecified: Secondary | ICD-10-CM | POA: Diagnosis not present

## 2017-07-15 DIAGNOSIS — Z87891 Personal history of nicotine dependence: Secondary | ICD-10-CM | POA: Insufficient documentation

## 2017-07-15 DIAGNOSIS — M25559 Pain in unspecified hip: Secondary | ICD-10-CM | POA: Diagnosis not present

## 2017-07-15 DIAGNOSIS — T3695XA Adverse effect of unspecified systemic antibiotic, initial encounter: Secondary | ICD-10-CM

## 2017-07-15 DIAGNOSIS — G8929 Other chronic pain: Secondary | ICD-10-CM | POA: Diagnosis not present

## 2017-07-15 DIAGNOSIS — M533 Sacrococcygeal disorders, not elsewhere classified: Secondary | ICD-10-CM | POA: Insufficient documentation

## 2017-07-15 DIAGNOSIS — M5030 Other cervical disc degeneration, unspecified cervical region: Secondary | ICD-10-CM | POA: Diagnosis not present

## 2017-07-15 DIAGNOSIS — Z79891 Long term (current) use of opiate analgesic: Secondary | ICD-10-CM | POA: Diagnosis not present

## 2017-07-15 DIAGNOSIS — Z5181 Encounter for therapeutic drug level monitoring: Secondary | ICD-10-CM | POA: Diagnosis not present

## 2017-07-15 DIAGNOSIS — M48061 Spinal stenosis, lumbar region without neurogenic claudication: Secondary | ICD-10-CM | POA: Insufficient documentation

## 2017-07-15 DIAGNOSIS — G894 Chronic pain syndrome: Secondary | ICD-10-CM | POA: Diagnosis not present

## 2017-07-15 DIAGNOSIS — M792 Neuralgia and neuritis, unspecified: Secondary | ICD-10-CM | POA: Diagnosis not present

## 2017-07-15 DIAGNOSIS — Z7982 Long term (current) use of aspirin: Secondary | ICD-10-CM | POA: Diagnosis not present

## 2017-07-15 DIAGNOSIS — M5442 Lumbago with sciatica, left side: Secondary | ICD-10-CM

## 2017-07-15 DIAGNOSIS — M5441 Lumbago with sciatica, right side: Secondary | ICD-10-CM

## 2017-07-15 MED ORDER — TIZANIDINE HCL 4 MG PO TABS: 4.0000 mg | ORAL_TABLET | Freq: Three times a day (TID) | ORAL | 1 refills | Status: DC

## 2017-07-15 MED ORDER — TRAMADOL HCL 50 MG PO TABS: 50.0000 mg | ORAL_TABLET | Freq: Four times a day (QID) | ORAL | 1 refills | Status: DC | PRN

## 2017-07-15 MED ORDER — GABAPENTIN 800 MG PO TABS: 800.0000 mg | ORAL_TABLET | Freq: Three times a day (TID) | ORAL | 0 refills | Status: DC

## 2017-07-15 MED ORDER — FLUCONAZOLE 150 MG PO TABS: ORAL_TABLET | ORAL | 0 refills | Status: DC

## 2017-07-15 MED ORDER — DOXYCYCLINE HYCLATE 100 MG PO TABS: 100.0000 mg | ORAL_TABLET | Freq: Two times a day (BID) | ORAL | 0 refills | Status: DC

## 2017-07-15 MED ORDER — ETODOLAC 500 MG PO TABS: 500.0000 mg | ORAL_TABLET | Freq: Two times a day (BID) | ORAL | 1 refills | Status: AC

## 2017-07-15 NOTE — Progress Notes (Signed)
Patient's Name: Ashley Fields  MRN: [id number]  Referring Provider: Nobie Putnam *  DOB: April 27, 1955  PCP: Olin Hauser, DO  DOS: 07/15/2017  Note by: Vevelyn Francois NP  Service setting: Ambulatory outpatient  Specialty: Interventional Pain Management  Location: [facility] (AMB) Pain Management Facility    Patient type: Established    Primary Reason(s) for Visit: Encounter for prescription drug management. (Level of risk: moderate)  CC: Back Pain (low); Hip Pain (left); and Foot Pain (left)  HPI  [name] is a 62 y.o. year old, female patient, who comes today for a medication management evaluation. She has Chronic pain syndrome; Chronic neck pain ; Chronic hip pain (Secondary Area of Pain) (Bilateral) (L>R); Long term current use of opiate analgesic; Long term prescription opiate use; Opiate use; Chronic knee pain (Left); Osteoarthritis of hip (Bilateral); Chronic Low back pain (Primary Area of Pain) (Bilateral)  (R>L); Chronic lower extremity pain (Tertiary Area of Pain) (Bilateral) (L>R); Long term prescription benzodiazepine use; Grade 1 Anterolisthesis of L3 over L4; Failed back surgical syndrome; Chronic shoulder pain (Left); History of cervical fusion (ACDF C4-C7); DDD (degenerative disc disease), cervical; Chronic sacroiliac joint pain (Bilateral) (R>L); Lumbar Facet Syndrome (Bilateral) (R>L); Lumbar facet osteoarthritis (Bilateral); DDD (degenerative disc disease), lumbar; History of hepatitis C; Hepatic steatosis; History of nephrolithiasis; Lumbar spinal stenosis (severe) (L3-4); Lumbar lateral recess stenosis (Bilateral) (L3-4); Lumbar foraminal stenosis (L3-4) (Left); PTSD (post-traumatic stress disorder); GERD (gastroesophageal reflux disease); Coccygodynia; Hollenhorst plaque, right eye; Family history of coronary artery disease; Carotid bruit; Mixed hyperlipidemia; Smoker; Other specified dorsopathies, sacral and sacrococcygeal region; Neurogenic pain; Abnormal intestinal  absorption; and S/P gastric bypass on their problem list. Her primarily concern today is the Back Pain (low); Hip Pain (left); and Foot Pain (left)  Pain Assessment: Location: Lower Back Radiating: left hip Onset: More than a month ago Duration: Chronic pain Quality: Constant, Throbbing, Shooting Severity: 3 /10 (subjective, self-reported pain score)  Note: Reported level is compatible with observation.                          Effect on ADL:   Timing: Constant Modifying factors: medications,rest, heat, ice BP: 114/68  HR: 67  [name] was last scheduled for an appointment on 07/15/2017 for medication management. During today's appointment we reviewed [name]'s chronic pain status, as well as her outpatient medication regimen. 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 She injured her left foot. She admits that she was seen in the ER. She was treated with anbx and one Percocet. She admits that she has kept it elevated. She is SP left hip injection. She admits that she did well with her steroid injection.   The patient  reports that she does not use drugs. Her body mass index is 31.28 kg/m.  Further details on both, my assessment(s), as well as the proposed treatment plan, please see below.  Controlled Substance Pharmacotherapy Assessment REMS (Risk Evaluation and Mitigation Strategy)  Analgesic:Tramadol 50 mg, 1 tab PO q6hrs (200 mg/day) MME/day:77m/day     SHart Rochester RN  07/15/2017 10:09 AM  Sign at close encounter Nursing Pain Medication Assessment:  Safety precautions to be maintained throughout the outpatient stay will include: orient to surroundings, keep bed in low position, maintain call bell within reach at all times, provide assistance with transfer out of bed and ambulation.  Medication Inspection Compliance: Pill count conducted under aseptic conditions, in front of the patient.  Neither the pills nor the bottle was removed from the  patient's sight at any time. Once count was completed pills were immediately returned to the patient in their original bottle.  Medication: Tramadol (Ultram) Pill/Patch Count: 0 of 120 pills remain Pill/Patch Appearance: Markings consistent with prescribed medication Bottle Appearance: Standard pharmacy container. Clearly labeled. Filled Date: 06 / 06 / 2019 Last Medication intake:  TodayNursing Pain Medication Assessment:  Safety precautions to be maintained throughout the outpatient stay will include: orient to surroundings, keep bed in low position, maintain call bell within reach at all times, provide assistance with transfer out of bed and ambulation.  Medication Inspection Compliance: Pill count conducted under aseptic conditions, in front of the patient. Neither the pills nor the bottle was removed from the patient's sight at any time. Once count was completed pills were immediately returned to the patient in their original bottle.  Medication: See above Pill/Patch Count: 0 of 0 pills remain Pill/Patch Appearance: Markings consistent with prescribed medication Bottle Appearance: Standard pharmacy container. Clearly labeled. Filled Date: 0 / 0 / 2018 Last Medication intake:  Today   Pharmacokinetics: Liberation and absorption (onset of action): WNL Distribution (time to peak effect): WNL Metabolism and excretion (duration of action): WNL         Pharmacodynamics: Desired effects: Analgesia: [name] reports >50% benefit. Functional ability: Patient reports that medication allows her to accomplish basic ADLs Clinically meaningful improvement in function (CMIF): Sustained CMIF goals met Perceived effectiveness: Described as relatively effective, allowing for increase in activities of daily living (ADL) Undesirable effects: Side-effects or Adverse reactions: None reported Monitoring: [place] PMP: Online review of the past 23-monthperiod conducted. Compliant with practice rules and  regulations Last UDS on record: Summary  Date Value Ref Range Status  02/11/2017 FINAL  Final    Comment:    ==================================================================== TOXASSURE SELECT 13 (MW) ==================================================================== Test                             Result       Flag       Units Drug Present and Declared for Prescription Verification   Lorazepam                      811          EXPECTED   ng/mg creat    Source of lorazepam is a scheduled prescription medication.   Tramadol                       >2674        EXPECTED   ng/mg creat   O-Desmethyltramadol            >2674        EXPECTED   ng/mg creat   N-Desmethyltramadol            >2674        EXPECTED   ng/mg creat    Source of tramadol is a prescription medication.    O-desmethyltramadol and N-desmethyltramadol are expected    metabolites of tramadol. Drug Absent but Declared for Prescription Verification   Butalbital                     Not Detected UNEXPECTED ==================================================================== Test                      Result    Flag  Units      Ref Range   Creatinine              187              mg/dL      >=20 ==================================================================== Declared Medications:  The flagging and interpretation on this report are based on the  following declared medications.  Unexpected results may arise from  inaccuracies in the declared medications.  **Note: The testing scope of this panel includes these medications:  Butalbital (Butalbital/APAP/Caffeine)  Lorazepam  Tramadol  **Note: The testing scope of this panel does not include following  reported medications:  Acetaminophen (Butalbital/APAP/Caffeine)  Caffeine (Butalbital/APAP/Caffeine)  Estradiol  Fluticasone  Gabapentin  Iron (Ferrous Sulfate)  Melatonin  Multivitamin  Omeprazole  Sertraline  Simvastatin  Tizanidine '  Trazodone  Turmeric   Vitamin B (Biotin)  Vitamin C ==================================================================== For clinical consultation, please call [phone]. ====================================================================    UDS interpretation: Compliant          Medication Assessment Form: Reviewed. Patient indicates being compliant with therapy Treatment compliance: Compliant Risk Assessment Profile: Aberrant behavior: frequent involvement in accidents 2 in the last 6 months will continue to monitor closely  Comorbid factors increasing risk of overdose: See prior notes. No additional risks detected today Risk of substance use disorder (SUD): Low Opioid Risk Tool - 06/17/17 0831      Family History of Substance Abuse   Alcohol  Negative    Illegal Drugs  Negative    Rx Drugs  Negative      Personal History of Substance Abuse   Alcohol  Negative    Illegal Drugs  Negative    Rx Drugs  Negative      Age   Age between 30-45 years   No      History of Preadolescent Sexual Abuse   History of Preadolescent Sexual Abuse  -- patient prefers not to answer question   patient prefers not to answer question     Psychological Disease   Psychological Disease  Positive    Depression  Positive and anxiety   and anxiety     Total Score   Opioid Risk Tool Scoring  3    Opioid Risk Interpretation  Low Risk      ORT Scoring interpretation table:  Score <3 = Low Risk for SUD  Score between 4-7 = Moderate Risk for SUD  Score >8 = High Risk for Opioid Abuse   Risk Mitigation Strategies:  Patient Counseling: Covered Patient-Prescriber Agreement (PPA): Present and active  Notification to other healthcare providers: Done  Pharmacologic Plan: No change in therapy, at this time.            Post-Procedure Assessment  06/27/2017 Procedure: Left hip injection and left SI joint injection Pre-procedure pain score:  3/10 Post-procedure pain score: 0/10         Influential Factors: BMI:  31.28 kg/m Intra-procedural challenges: None observed.         Assessment challenges: None detected.              Reported side-effects: None.        Post-procedural adverse reactions or complications: None reported         Sedation: Please see nurses note. When no sedatives are used, the analgesic levels obtained are directly associated to the effectiveness of the local anesthetics. However, when sedation is provided, the level of analgesia obtained during the initial 1 hour following the intervention, is believed  to be the result of a combination of factors. These factors may include, but are not limited to: 1. The effectiveness of the local anesthetics used. 2. The effects of the analgesic(s) and/or anxiolytic(s) used. 3. The degree of discomfort experienced by the patient at the time of the procedure. 4. The patients ability and reliability in recalling and recording the events. 5. The presence and influence of possible secondary gains and/or psychosocial factors. Reported result: Relief experienced during the 1st hour after the procedure: 85 % (Ultra-Short Term Relief)            Interpretative annotation: Clinically appropriate result. Analgesia during this period is likely to be Local Anesthetic and/or IV Sedative (Analgesic/Anxiolytic) related.          Effects of local anesthetic: The analgesic effects attained during this period are directly associated to the localized infiltration of local anesthetics and therefore cary significant diagnostic value as to the etiological location, or anatomical origin, of the pain. Expected duration of relief is directly dependent on the pharmacodynamics of the local anesthetic used. Long-acting (4-6 hours) anesthetics used.  Reported result: Relief during the next 4 to 6 hour after the procedure: 85 % (Short-Term Relief)            Interpretative annotation: Clinically appropriate result. Analgesia during this period is likely to be Local  Anesthetic-related.          Long-term benefit: Defined as the period of time past the expected duration of local anesthetics (1 hour for short-acting and 4-6 hours for long-acting). With the possible exception of prolonged sympathetic blockade from the local anesthetics, benefits during this period are typically attributed to, or associated with, other factors such as analgesic sensory neuropraxia, antiinflammatory effects, or beneficial biochemical changes provided by agents other than the local anesthetics.  Reported result: Extended relief following procedure: 75 % (Long-Term Relief)            Interpretative annotation: Clinically appropriate result. Good relief. No permanent benefit expected. Inflammation plays a part in the etiology to the pain.          Current benefits: Defined as reported results that persistent at this point in time.   Analgesia: >50 %            Function: Somewhat improved ROM: Somewhat improved Interpretative annotation: Good relief.    Effective diagnostic intervention.          Interpretation: Results would suggest a successful diagnostic intervention.                  Plan:  Please see "Plan of Care" for details.                Laboratory Chemistry  Inflammation Markers (CRP: Acute Phase) (ESR: Chronic Phase) Lab Results  Component Value Date   CRP 0.7 08/14/2016   ESRSEDRATE 9 08/14/2016                         Rheumatology Markers No results found for: RF, ANA, LABURIC, URICUR, LYMEIGGIGMAB, LYMEABIGMQN, HLAB27                      Renal Function Markers Lab Results  Component Value Date   BUN 17 08/14/2016   CREATININE 0.80 08/14/2016   BCR 21 08/14/2016   GFRAA 93 08/14/2016   GFRNONAA 80 08/14/2016  Hepatic Function Markers Lab Results  Component Value Date   AST 20 08/14/2016   ALT 10 08/14/2016   ALBUMIN 4.9 (H) 08/14/2016   ALKPHOS 103 08/14/2016                        Electrolytes Lab Results   Component Value Date   NA 139 08/14/2016   K 4.1 08/14/2016   CL 97 08/14/2016   CALCIUM 9.6 08/14/2016   MG 2.3 08/14/2016                        Neuropathy Markers Lab Results  Component Value Date   VITAMINB12 473 08/14/2016                        Bone Pathology Markers Lab Results  Component Value Date   25OHVITD1 47 08/14/2016   25OHVITD2 <1.0 08/14/2016   25OHVITD3 47 08/14/2016                         Coagulation Parameters No results found for: INR, LABPROT, APTT, PLT, DDIMER                      Cardiovascular Markers No results found for: BNP, CKTOTAL, CKMB, TROPONINI, HGB, HCT                       CA Markers No results found for: CEA, CA125, LABCA2                      Note: Lab results reviewed.  Recent Diagnostic Imaging Results  DG Foot Complete Left CLINICAL DATA:  Stepped on the rake puncture wound  EXAM: LEFT FOOT - COMPLETE 3+ VIEW  COMPARISON:  None.  FINDINGS: No fracture or malalignment. No radiopaque foreign body in the soft tissues. Small plantar calcaneal spur and posterior enthesophyte.  IMPRESSION: No acute osseous abnormality.  Electronically Signed   By: Donavan Foil M.D.   On: 07/12/2017 22:22  Complexity Note: Imaging results reviewed. Results shared with [name], using Layman's terms.                         Meds   Current Outpatient Medications:  .  amoxicillin-clavulanate (AUGMENTIN) 875-125 MG tablet, Take 1 tablet by mouth 2 (two) times daily., Disp: 14 tablet, Rfl: 0 .  ARIPiprazole (ABILIFY) 5 MG tablet, TAKE 1 TABLET BY MOUTH EVERY DAY, Disp: 90 tablet, Rfl: 1 .  aspirin EC 81 MG tablet, Take 1 tablet (81 mg total) by mouth daily., Disp: , Rfl:  .  Biotin 10000 MCG TABS, Take 10,000 mcg by mouth 1 day or 1 dose., Disp: , Rfl:  .  cholecalciferol (VITAMIN D) 1000 units tablet, Take 1,000 Units by mouth daily., Disp: , Rfl:  .  Difluprednate (DUREZOL) 0.05 % EMUL, Place into the left eye daily., Disp: , Rfl:   .  estradiol (ESTRACE) 1 MG tablet, Take 1 mg by mouth daily., Disp: , Rfl:  .  ezetimibe (ZETIA) 10 MG tablet, Take 1 tablet (10 mg total) by mouth daily., Disp: 90 tablet, Rfl: 3 .  ferrous sulfate 325 (65 FE) MG tablet, Take 325 mg by mouth daily., Disp: , Rfl:  .  fluticasone (FLONASE) 50 MCG/ACT nasal spray, 1 SPRAY IN EACH NOSTRIL ONCE A DAY  NASALLY 30 DAYS as needed, Disp: , Rfl: 3 .  gabapentin (NEURONTIN) 800 MG tablet, Take 1 tablet (800 mg total) by mouth 3 (three) times daily., Disp: 180 tablet, Rfl: 0 .  LORazepam (ATIVAN) 0.5 MG tablet, TAKE 1 TABLET (0.5 MG TOTAL) BY MOUTH 2 (TWO) TIMES DAILY AS NEEDED FOR ANXIETY., Disp: 45 tablet, Rfl: 2 .  Magnesium 500 MG CAPS, Take 1 capsule (500 mg total) by mouth 2 (two) times daily at 8 am and 10 pm., Disp: 60 capsule, Rfl: 5 .  Melatonin 10 MG CAPS, Take 1 capsule by mouth at bedtime., Disp: , Rfl:  .  Multiple Vitamin (MULTIVITAMIN WITH MINERALS) TABS tablet, Take 1 tablet by mouth daily., Disp: , Rfl:  .  neomycin-polymyxin-hydrocortisone (CORTISPORIN) OTIC solution, Place 2 drops into both ears 2 (two) times daily., Disp: 10 mL, Rfl: 0 .  omeprazole (PRILOSEC) 40 MG capsule, Take 40 mg by mouth daily., Disp: , Rfl: 0 .  ondansetron (ZOFRAN ODT) 4 MG disintegrating tablet, Take 1 tablet (4 mg total) by mouth every 8 (eight) hours as needed for nausea or vomiting., Disp: 30 tablet, Rfl: 0 .  rosuvastatin (CRESTOR) 40 MG tablet, Take 1 tablet (40 mg total) by mouth daily., Disp: 90 tablet, Rfl: 3 .  sertraline (ZOLOFT) 100 MG tablet, Take 1 tablet (100 mg total) by mouth 2 (two) times daily., Disp: 180 tablet, Rfl: 1 .  tiZANidine (ZANAFLEX) 4 MG tablet, Take 1 tablet (4 mg total) by mouth 3 (three) times daily., Disp: 90 tablet, Rfl: 1 .  traMADol (ULTRAM) 50 MG tablet, Take 1 tablet (50 mg total) by mouth every 6 (six) hours as needed for severe pain., Disp: 120 tablet, Rfl: 1 .  traZODone (DESYREL) 150 MG tablet, Take 150 mg by mouth at  bedtime., Disp: , Rfl: 0 .  Turmeric 500 MG CAPS, Take 2 capsules by mouth daily., Disp: , Rfl:  .  vitamin B-12 (CYANOCOBALAMIN) 500 MCG tablet, Take 500 mcg by mouth daily., Disp: , Rfl:  .  vitamin E 100 UNIT capsule, Take by mouth daily., Disp: , Rfl:  .  butalbital-acetaminophen-caffeine (FIORICET, ESGIC) 50-325-40 MG tablet, Take 1-2 tablets by mouth every 6 (six) hours as needed for headache., Disp: 90 tablet, Rfl: 3 .  doxycycline (VIBRA-TABS) 100 MG tablet, Take 1 tablet (100 mg total) by mouth 2 (two) times daily. For 7 days. Take with full glass of water, stay upright 30 min after taking., Disp: 14 tablet, Rfl: 0 .  etodolac (LODINE) 500 MG tablet, Take 1 tablet (500 mg total) by mouth 2 (two) times daily., Disp: 60 tablet, Rfl: 1 .  fluconazole (DIFLUCAN) 150 MG tablet, Take one tablet by mouth on Day 1. Repeat dose 2nd tablet on Day 3., Disp: 2 tablet, Rfl: 0 .  oxyCODONE-acetaminophen (PERCOCET) 5-325 MG tablet, Take 1 tablet by mouth every 6 (six) hours as needed for moderate pain or severe pain., Disp: 20 tablet, Rfl: 0  ROS  Constitutional: Denies any fever or chills Gastrointestinal: No reported hemesis, hematochezia, vomiting, or acute GI distress Musculoskeletal: Denies any acute onset joint swelling, redness, loss of ROM, or weakness Neurological: No reported episodes of acute onset apraxia, aphasia, dysarthria, agnosia, amnesia, paralysis, loss of coordination, or loss of consciousness  Allergies  [name] is allergic to flagyl [metronidazole] and tape.  PFSH  Drug: [name]  reports that she does not use drugs. Alcohol:  reports that she does not drink alcohol. Tobacco:  reports that she has  quit smoking. She quit after 4.00 years of use. She has never used smokeless tobacco. Medical:  has a past medical history of Allergy, Anemia, Chronic kidney disease, Depression, Disp fx of cuboid bone of right foot, init for clos fx (01/01/2017), GERD (gastroesophageal reflux  disease), Headache, Hepatitis C, Hyperlipidemia, Osteoporosis, Thyroid disease, and Urine incontinence. Surgical: [name]  has a past surgical history that includes Cesarean section (07/27/1981); Abdominal hysterectomy (1987); Breast reduction surgery (Bilateral, 1997); Cholecystectomy (03/2004); Appendectomy (12/2008); Gastric bypass (2005); mini tummy tuck (05/07/2016); breast lift bilateral, implants (05/18/2015); Lumbar laminectomy (06/2006); 5 miscarriages; neck surgery C3-C7 (02/10/2015); IVC FILTER INSERTION (12/2003); Spinal cord stimulator implant (11/2010); Shoulder acromioplasty (Left, 03/27/2013); Reduction mammaplasty (Bilateral, 1997); Augmentation mammaplasty (Bilateral, 2015); and Augmentation mammaplasty (Bilateral, 2018). Family: family history includes COPD in her mother; Colon polyps in her sister; Depression in her sister; Diabetes in her brother and mother; Heart attack in her brother, mother, and sister; Heart attack (age of onset: 42) in her father; Heart disease in her brother, father, and mother; Lung cancer in her mother; Stroke in her mother.  Constitutional Exam  General appearance: Well nourished, well developed, and well hydrated. In no apparent acute distress Vitals:   07/15/17 1000  BP: 114/68  Pulse: 67  Resp: 18  Temp: 98.1 F (36.7 C)  TempSrc: Oral  SpO2: 98%  Weight: 171 lb (77.6 kg)  Height: 5' 2"  (1.575 m)  Psych/Mental status: Alert, oriented x 3 (person, place, & time)       Eyes: PERLA Respiratory: No evidence of acute respiratory distress  Lumbar Spine Area Exam  Skin & Axial Inspection: No masses, redness, or swelling Alignment: Symmetrical Functional ROM: Unrestricted ROM       Stability: No instability detected Muscle Tone/Strength: Functionally intact. No obvious neuro-muscular anomalies detected. Sensory (Neurological): Unimpaired Palpation: No palpable anomalies       Provocative Tests: Lumbar Hyperextension/rotation test: deferred  today       Lumbar quadrant test (Kemp's test): deferred today       Lumbar Lateral bending test: deferred today       Patrick's Maneuver: deferred today                   FABER test: deferred today       Thigh-thrust test: deferred today       S-I compression test: deferred today       S-I distraction test: deferred today        Gait & Posture Assessment  Ambulation: Patient ambulates using a wheel chair Gait: Relatively normal for age and body habitus Posture: WNL   Lower Extremity Exam    Side: Right lower extremity  Side: Left lower extremity  Stability: No instability observed          Stability: No instability observed          Skin & Extremity Inspection: Skin color, temperature, and hair growth are WNL. No peripheral edema or cyanosis. No masses, redness, swelling, asymmetry, or associated skin lesions. No contractures.  Skin & Extremity Inspection: ace wrap in place  Functional ROM: Unrestricted ROM                  Functional ROM: Unrestricted ROM                  Muscle Tone/Strength: Functionally intact. No obvious neuro-muscular anomalies detected.  Muscle Tone/Strength: Functionally intact. No obvious neuro-muscular anomalies detected.  Sensory (Neurological): Unimpaired  Sensory (Neurological): Unimpaired  Palpation: No palpable anomalies  Palpation: No palpable anomalies   Assessment  Primary Diagnosis & Pertinent Problem List: The primary encounter diagnosis was Chronic Low back pain (Primary Area of Pain) (Bilateral)  (R>L). Diagnoses of Chronic hip pain (Secondary Area of Pain) (Bilateral) (L>R), Chronic sacroiliac joint pain (Bilateral) (R>L), Chronic pain syndrome, and Neurogenic pain were also pertinent to this visit.  Status Diagnosis  Controlled Controlled Controlled 1. Chronic Low back pain (Primary Area of Pain) (Bilateral)  (R>L)   2. Chronic hip pain (Secondary Area of Pain) (Bilateral) (L>R)   3. Chronic sacroiliac joint pain (Bilateral) (R>L)   4.  Chronic pain syndrome   5. Neurogenic pain     Problems updated and reviewed during this visit: No problems updated. Plan of Care  Pharmacotherapy (Medications Ordered): Meds ordered this encounter  Medications  . tiZANidine (ZANAFLEX) 4 MG tablet    Sig: Take 1 tablet (4 mg total) by mouth 3 (three) times daily.    Dispense:  90 tablet    Refill:  1    Do not place medication on "Automatic Refill". Fill one day early if pharmacy is closed on scheduled refill date.    Order Specific Question:   Supervising Provider    Answer:   Milinda Pointer [phone]  . gabapentin (NEURONTIN) 800 MG tablet    Sig: Take 1 tablet (800 mg total) by mouth 3 (three) times daily.    Dispense:  180 tablet    Refill:  0    Do not place this medication, or any other prescription from our practice, on "Automatic Refill". Patient may have prescription filled one day early if pharmacy is closed on scheduled refill date.    Order Specific Question:   Supervising Provider    Answer:   Milinda Pointer [phone]  . etodolac (LODINE) 500 MG tablet    Sig: Take 1 tablet (500 mg total) by mouth 2 (two) times daily.    Dispense:  60 tablet    Refill:  1    Order Specific Question:   Supervising Provider    Answer:   Milinda Pointer [phone]  . traMADol (ULTRAM) 50 MG tablet    Sig: Take 1 tablet (50 mg total) by mouth every 6 (six) hours as needed for severe pain.    Dispense:  120 tablet    Refill:  1    Fill one day early if pharmacy is closed on scheduled refill date. Do not fill until:07/15/2017 To last until:09/13/2017    Order Specific Question:   Supervising Provider    Answer:   Milinda Pointer [phone]   New Prescriptions   BUTALBITAL-ACETAMINOPHEN-CAFFEINE (FIORICET, ESGIC) 50-325-40 MG TABLET    Take 1-2 tablets by mouth every 6 (six) hours as needed for headache.   DOXYCYCLINE (VIBRA-TABS) 100 MG TABLET    Take 1 tablet (100 mg total) by mouth 2 (two) times daily. For 7 days. Take with full  glass of water, stay upright 30 min after taking.   FLUCONAZOLE (DIFLUCAN) 150 MG TABLET    Take one tablet by mouth on Day 1. Repeat dose 2nd tablet on Day 3.   OXYCODONE-ACETAMINOPHEN (PERCOCET) 5-325 MG TABLET    Take 1 tablet by mouth every 6 (six) hours as needed for moderate pain or severe pain.   Medications administered today: Elowen Debruyn had no medications administered during this visit. Lab-work, procedure(s), and/or referral(s): No orders of the defined types were placed in this encounter.  Imaging and/or referral(s): None  Interventional  management options: Planned, scheduled, and/or pending: Not at this time. She was given information for acute pain treatment.    Considering: Diagnosticbilateral lumbar epidural steroid injection Possible bilateral lumbar facet blockRFA Diagnosticleft hip injection Diagnosticleft femoral nerve+ obturator nerve block Diagnostic left knee intra-articular steroid injection Diagnosticleft genicular nerve block Possible left kneeRFA  Diagnosticcervical epidural steroid injection Diagnosticbilateral cervical facet block Possiblebilateral cervicalRFA   PRN Procedures: To be determined at a later time    Provider-requested follow-up: Return in about 2 months (around 09/15/2017) for MedMgmt with Me Dionisio David), Appointment As Scheduled.  Future Appointments  Date Time Provider [place]  07/17/2017  9:30 AM [facility]  07/23/2017  7:45 AM Milinda Pointer, MD [facility] None  07/30/2017  8:45 AM [facility] NURSE [place] None  08/08/2017  9:00 AM Olin Hauser, DO [place] None  09/12/2017  8:45 AM Vevelyn Francois, NP [facility] None   Primary Care Physician: Olin Hauser, DO Location: [facility] Note by: Vevelyn Francois NP Date: 07/15/2017; Time: 1:12 PM  Pain Score Disclaimer: We use the NRS-11 scale. This is a self-reported, subjective  measurement of pain severity with only modest accuracy. It is used primarily to identify changes within a particular patient. It must be understood that outpatient pain scales are significantly less accurate that those used for research, where they can be applied under ideal controlled circumstances with minimal exposure to variables. In reality, the score is likely to be a combination of pain intensity and pain affect, where pain affect describes the degree of emotional arousal or changes in action readiness caused by the sensory experience of pain. Factors such as social and work situation, setting, emotional state, anxiety levels, expectation, and prior pain experience may influence pain perception and show large inter-individual differences that may also be affected by time variables.  Patient instructions provided during this appointment: Patient Instructions   ____________________________________________________________________________________________  Medication Rules  Applies to: All patients receiving prescriptions (written or electronic).  Pharmacy of record: Pharmacy where electronic prescriptions will be sent. If written prescriptions are taken to a different pharmacy, please inform the nursing staff. The pharmacy listed in the electronic medical record should be the one where you would like electronic prescriptions to be sent.  Prescription refills: Only during scheduled appointments. Applies to both, written and electronic prescriptions.  NOTE: The following applies primarily to controlled substances (Opioid* Pain Medications).   Patient's responsibilities: 1. Pain Pills: Bring all pain pills to every appointment (except for procedure appointments). 2. Pill Bottles: Bring pills in original pharmacy bottle. Always bring newest bottle. Bring bottle, even if empty. 3. Medication refills: You are responsible for knowing and keeping track of what medications you need refilled. The day  before your appointment, write a list of all prescriptions that need to be refilled. Bring that list to your appointment and give it to the admitting nurse. Prescriptions will be written only during appointments. If you forget a medication, it will not be "Called in", "Faxed", or "electronically sent". You will need to get another appointment to get these prescribed. 4. Prescription Accuracy: You are responsible for carefully inspecting your prescriptions before leaving our office. Have the discharge nurse carefully go over each prescription with you, before taking them home. Make sure that your name is accurately spelled, that your address is correct. Check the name and dose of your medication to make sure it is accurate. Check the number of pills, and the written instructions to make sure  they are clear and accurate. Make sure that you are given enough medication to last until your next medication refill appointment. 5. Taking Medication: Take medication as prescribed. Never take more pills than instructed. Never take medication more frequently than prescribed. Taking less pills or less frequently is permitted and encouraged, when it comes to controlled substances (written prescriptions).  6. Inform other Doctors: Always inform, all of your healthcare providers, of all the medications you take. 7. Pain Medication from other Providers: You are not allowed to accept any additional pain medication from any other Doctor or Healthcare provider. There are two exceptions to this rule. (see below) In the event that you require additional pain medication, you are responsible for notifying us, as stated below. 8. Medication Agreement: You are responsible for carefully reading and following our Medication Agreement. This must be signed before receiving any prescriptions from our practice. Safely store a copy of your signed Agreement. Violations to the Agreement will result in no further prescriptions. (Additional copies  of our Medication Agreement are available upon request.) 9. Laws, Rules, & Regulations: All patients are expected to follow all Federal and [employer], [employer], Rules, [employer]. Ignorance of the Laws does not constitute a valid excuse. The use of any illegal substances is prohibited. 10. Adopted CDC guidelines & recommendations: Target dosing levels will be at or below 60 MME/day. Use of benzodiazepines** is not recommended.  Exceptions: There are only two exceptions to the rule of not receiving pain medications from other Healthcare Providers. 1. Exception #1 (Emergencies): In the event of an emergency (i.e.: accident requiring emergency care), you are allowed to receive additional pain medication. However, you are responsible for: As soon as you are able, call our office [phone], at any time of the day or night, and leave a message stating your name, the date and nature of the emergency, and the name and dose of the medication prescribed. In the event that your call is answered by a member of our staff, make sure to document and save the date, time, and the name of the person that took your information.  2. Exception #2 (Planned Surgery): In the event that you are scheduled by another doctor or dentist to have any type of surgery or procedure, you are allowed (for a period no longer than 30 days), to receive additional pain medication, for the acute post-op pain. However, in this case, you are responsible for picking up a copy of our "Post-op Pain Management for Surgeons" handout, and giving it to your surgeon or dentist. This document is available at our office, and does not require an appointment to obtain it. Simply go to our office during business hours (Monday-Thursday from 8:00 AM to 4:00 PM) (Friday 8:00 AM to 12:00 Noon) or if you have a scheduled appointment with [place], prior to your surgery, and ask for it by name. In addition, you will need to provide [place] with your name, name of your  surgeon, type of surgery, and date of procedure or surgery.  *Opioid medications include: morphine, codeine, oxycodone, oxymorphone, hydrocodone, hydromorphone, meperidine, tramadol, tapentadol, buprenorphine, fentanyl, methadone. **Benzodiazepine medications include: diazepam (Valium), alprazolam (Xanax), clonazepam (Klonopine), lorazepam (Ativan), clorazepate (Tranxene), chlordiazepoxide (Librium), estazolam (Prosom), oxazepam (Serax), temazepam (Restoril), triazolam (Halcion) (Last updated: 03/07/2017) ____________________________________________________________________________________________   BMI Assessment: Estimated body mass index is 31.28 kg/m as calculated from the following:   Height as of this encounter: 5' 2"  (1.575 m).   Weight as of this encounter: 171 lb (77.6 kg).  BMI interpretation table: BMI level Category Range association with higher incidence of chronic pain  <18 kg/m2 Underweight   18.5-24.9 kg/m2 Ideal body weight   25-29.9 kg/m2 Overweight Increased incidence by 20%  30-34.9 kg/m2 Obese (Class I) Increased incidence by 68%  35-39.9 kg/m2 Severe obesity (Class II) Increased incidence by 136%  >40 kg/m2 Extreme obesity (Class III) Increased incidence by 254%   Patient's current BMI Ideal Body weight  Body mass index is 31.28 kg/m. Ideal body weight: 50.1 kg (110 lb 7.2 oz) Adjusted ideal body weight: 61.1 kg (134 lb 10.7 oz)   BMI Readings from Last 4 Encounters:  07/15/17 31.28 kg/m  07/12/17 30.73 kg/m  07/05/17 31.83 kg/m  06/27/17 31.28 kg/m   Wt Readings from Last 4 Encounters:  07/15/17 171 lb (77.6 kg)  07/12/17 168 lb (76.2 kg)  07/05/17 174 lb (78.9 kg)  06/27/17 171 lb (77.6 kg)

## 2017-07-15 NOTE — Patient Instructions (Addendum)
Thank you for coming to the office today.  Continue Augmentin antibiotic - also added Doxycycline antibiotic 1 pill twice daily with food and water stay upright 30 min for 7 days  Start more regular use of Ice packs up to 10-15 min  Use RICE therapy: - R - Rest / relative rest with activity modification avoid overuse of joint - I - Ice packs (make sure you use a towel or sock / something to protect skin) - C - Compression with ACE wrap to apply pressure and reduce swelling allowing more support - E - Elevation - if significant swelling, lift leg above heart level (toes above your nose) to help reduce swelling, most helpful at night after day of being on your feet  Bring by the letter from Pain Management we can review it and see what I can do for acute pain  If not improving you may need to return for re-evaluation. But if more severe worsening such as spreading redness or streaking redness, significantly larger size, persistent drainage of pus, increased pain, fevers/chills, nausea vomiting and cannot take antibiotic. If significantly worse symptoms or most of these symptoms, would recommend going straight to Hospital Emergency Dept as you may require IV antibiotics instead.   Please schedule a Follow-up Appointment to: Return in about 1 week (around 07/22/2017), or if symptoms worsen or fail to improve, for foot injury, celluitis.  If you have any other questions or concerns, please feel free to call the office or send a message through [place]. You may also schedule an earlier appointment if necessary.  Additionally, you may be receiving a survey about your experience at our office within a few days to 1 week by e-mail or mail. We value your feedback.  Nobie Putnam, DO [place]

## 2017-07-15 NOTE — Progress Notes (Addendum)
Subjective:    Patient ID: Ashley Fields, female    DOB: 1955/04/25, 62 y.o.   MRN: [id number]  Ashley Fields is a 62 y.o. female presenting on 07/15/2017 for Foot Injury (L foot puncture wound done 07/12/2017 ER Follow Up Was cleaning chicken pen and stepped on rake. Increased redness and swelling around wound and LG temp 99.0. )  Patient presents for a same day appointment.  HPI   ED FOLLOW-UP VISIT  Hospital/Location: [facility] Date of ED Visit: 07/12/17  Reason for Presenting to ED: Foot Pain, Puncture Wound Primary (+Secondary) Diagnosis: Puncture Wound Left Foot  FOLLOW-UP  - ED provider note and record have been reviewed - Patient presents today about 3 days after recent ED visit. Brief summary of recent course, patient had symptoms of new acute injury left foot pain after stepping on a rake on 07/12/17, she went to ED had the wound cleaned, and given updated tetanus shot for update, given dressing, and rx Augmentin for empiric prophylaxis at that time was not diagnosed with Cellulitis, she did receive percocet x 1 in ED with pain relief, she is on chronic pain management contract with [facility] and takes Tramadol, this was no longer helping by her report - Today reports overall has not improved much after discharge from ED. Symptoms of pain seem worse and her Tramadol is ineffective. She was contacted by [facility] Pain Management regarding this and was asked to follow-up with [place] here regarding this acute injury pain. - She is using ACE wrap, not using ice, she is using walking boot for support, some elevation. - Admits some worsening redness and seems to be slightly spreading, some clear to serosanguinous drainage without purulence Admits low grade temp Admits foot swelling and erythema - Denies any chills sweats, nausea vomiting  - New medications on discharge: Augmentin BID x 7 days  - Changes to current meds on discharge: None   Depression screen [facility] 2/9 07/15/2017 07/05/2017 06/27/2017  Decreased  Interest 0 1 0  Down, Depressed, Hopeless 0 1 0  PHQ - 2 Score 0 2 0  Altered sleeping - 3 -  Tired, decreased energy - 3 -  Change in appetite - 3 -  Feeling bad or failure about yourself  - 0 -  Trouble concentrating - 1 -  Moving slowly or fidgety/restless - 1 -  Suicidal thoughts - 0 -  PHQ-9 Score - 13 -  Difficult doing work/chores - Somewhat difficult -  Some recent data might be hidden    Social History   Tobacco Use  . Smoking status: Former Smoker    Years: 4.00  . Smokeless tobacco: Never Used  Substance Use Topics  . Alcohol use: No  . Drug use: No    Review of Systems Per HPI unless specifically indicated above     Objective:    BP (!) 100/54   Pulse 74   Temp 99 F (37.2 C)   Resp 16   Ht 5\' 2"  (1.575 m)   Wt 174 lb (78.9 kg)   SpO2 98%   BMI 31.83 kg/m   Wt Readings from Last 3 Encounters:  07/15/17 174 lb (78.9 kg)  07/15/17 171 lb (77.6 kg)  07/12/17 168 lb (76.2 kg)    Physical Exam  Constitutional: She is oriented to person, place, and time. She appears well-developed and well-nourished. No distress.  Well-appearing, comfortable, cooperative  HENT:  Head: Normocephalic and atraumatic.  Mouth/Throat: Oropharynx is clear and moist.  Eyes: Conjunctivae are  normal. Right eye exhibits no discharge. Left eye exhibits no discharge.  Cardiovascular: Normal rate.  Pulmonary/Chest: Effort normal.  Musculoskeletal: She exhibits edema (soft tissue edema left foot generalized).  Neurological: She is alert and oriented to person, place, and time.  Skin: Skin is warm and dry. No rash noted. She is not diaphoretic. There is erythema (Left foot dorsal and plantar aspect see picture, some extension from initial onset).  Slightly warm to touch L foot  Puncture wounds x 2 identified, no induration or purulent drainage, tender  Psychiatric: She has a normal mood and affect. Her behavior is normal.  Well groomed, good eye contact, normal speech and thoughts   Nursing note and vitals reviewed.     Left Foot plantar    Left Foot dorsal     Results for orders placed or performed during the hospital encounter of 06/13/17  NM Myocar Multi W/Spect W/Wall Motion / EF  Result Value Ref Range   Rest HR 64 bpm   Rest BP 137/81 mmHg   Percent HR 60 %   Peak HR 96 bpm   Peak BP 157/74 mmHg   SSS 0    SRS 10    SDS 0    TID 0.91    LV sys vol 26 mL   LV dias vol 64 46 - 106 mL      Assessment & Plan:   Problem List Items Addressed This Visit    None    Visit Diagnoses    Cellulitis of left foot    -  Primary   Relevant Medications   doxycycline (VIBRA-TABS) 100 MG tablet   Puncture wound of left foot, initial encounter       Antibiotic-induced yeast infection       Relevant Medications   fluconazole (DIFLUCAN) 150 MG tablet      Clinically with concern for cellulitis secondary to outdoor puncture wound on dirty rake Limited improvement from ED 07/12/17 3 days ago, persistent acute pain not improved on Tramadol, and concern limit improve 3 days on Augmentin. Some signs of infection with elevated temp but no actual fever, some extending erythema without purulence  Plan Regarding infection - start dual coverage added Doxycycline 100mg  BID x 7 days and CONTINUE Augmentin - Empiric diflucan for antibiotic induced yeast infection Continue RICE therapy, use more ice packs and elevation with ACE compression, boot as needed Return criteria given if worsening infection - may need to go to hospital ED for IV antibiotic if dramatic worsening or not improved  Regarding pain, given acute new injury pain, will consider temporary controlled rx of opiate such as Percocet for 5 day supply only, however awaiting confirmation from her [facility] Pain management office, patient states she was given a note but she forgot it at home. Called and left voicemail and send Epic EHR message to provider for follow-up before prescribing.  **UPDATE 07/16/17 - spoke  with [facility] Dionisio David NP who called back in response to our question, she agrees that we may proceed with acute pain management for this acute injury, as they do not treat the acute injury pain. Patient did follow the appropriate channels after her acute injury and has followed up appropriately. We agree that good option would be percocet 5/325mg  for acute 5 day supply for dosing 1 pill every 6 hours PRN pain, #20 pills for 5 day supply 0 refills.  Called patient, spoke with [name], reviewed above message, agree for acute pain medicine course, Sent  e-script to her [employer]. She understands this is a one time rx for this issue in particular, in future if any recurrent acute pain rx needed we will need to get permission again from pain management**   Meds ordered this encounter  Medications  . doxycycline (VIBRA-TABS) 100 MG tablet    Sig: Take 1 tablet (100 mg total) by mouth 2 (two) times daily. For 7 days. Take with full glass of water, stay upright 30 min after taking.    Dispense:  14 tablet    Refill:  0  . fluconazole (DIFLUCAN) 150 MG tablet    Sig: Take one tablet by mouth on Day 1. Repeat dose 2nd tablet on Day 3.    Dispense:  2 tablet    Refill:  0  . oxyCODONE-acetaminophen (PERCOCET) 5-325 MG tablet    Sig: Take 1 tablet by mouth every 6 (six) hours as needed for moderate pain or severe pain.    Dispense:  20 tablet    Refill:  0     Follow up plan: Return in about 1 week (around 07/22/2017), or if symptoms worsen or fail to improve, for foot injury, celluitis.  Nobie Putnam, [facility] 07/15/2017, 4:51 PM

## 2017-07-15 NOTE — Progress Notes (Signed)
Nursing Pain Medication Assessment:  Safety precautions to be maintained throughout the outpatient stay will include: orient to surroundings, keep bed in low position, maintain call bell within reach at all times, provide assistance with transfer out of bed and ambulation.  Medication Inspection Compliance: Pill count conducted under aseptic conditions, in front of the patient. Neither the pills nor the bottle was removed from the patient's sight at any time. Once count was completed pills were immediately returned to the patient in their original bottle.  Medication: Tramadol (Ultram) Pill/Patch Count: 0 of 120 pills remain Pill/Patch Appearance: Markings consistent with prescribed medication Bottle Appearance: Standard pharmacy container. Clearly labeled. Filled Date: 06 / 06 / 2019 Last Medication intake:  TodayNursing Pain Medication Assessment:  Safety precautions to be maintained throughout the outpatient stay will include: orient to surroundings, keep bed in low position, maintain call bell within reach at all times, provide assistance with transfer out of bed and ambulation.  Medication Inspection Compliance: Pill count conducted under aseptic conditions, in front of the patient. Neither the pills nor the bottle was removed from the patient's sight at any time. Once count was completed pills were immediately returned to the patient in their original bottle.  Medication: See above Pill/Patch Count: 0 of 0 pills remain Pill/Patch Appearance: Markings consistent with prescribed medication Bottle Appearance: Standard pharmacy container. Clearly labeled. Filled Date: 0 / 0 / 2018 Last Medication intake:  Today

## 2017-07-15 NOTE — Telephone Encounter (Signed)
Has changed pharmacy to  [facility] [phone]

## 2017-07-15 NOTE — Telephone Encounter (Signed)
Noted and changed.  

## 2017-07-15 NOTE — Patient Instructions (Signed)
____________________________________________________________________________________________  Medication Rules  Applies to: All patients receiving prescriptions (written or electronic).  Pharmacy of record: Pharmacy where electronic prescriptions will be sent. If written prescriptions are taken to a different pharmacy, please inform the nursing staff. The pharmacy listed in the electronic medical record should be the one where you would like electronic prescriptions to be sent.  Prescription refills: Only during scheduled appointments. Applies to both, written and electronic prescriptions.  NOTE: The following applies primarily to controlled substances (Opioid* Pain Medications).   Patient's responsibilities: 1. Pain Pills: Bring all pain pills to every appointment (except for procedure appointments). 2. Pill Bottles: Bring pills in original pharmacy bottle. Always bring newest bottle. Bring bottle, even if empty. 3. Medication refills: You are responsible for knowing and keeping track of what medications you need refilled. The day before your appointment, write a list of all prescriptions that need to be refilled. Bring that list to your appointment and give it to the admitting nurse. Prescriptions will be written only during appointments. If you forget a medication, it will not be "Called in", "Faxed", or "electronically sent". You will need to get another appointment to get these prescribed. 4. Prescription Accuracy: You are responsible for carefully inspecting your prescriptions before leaving our office. Have the discharge nurse carefully go over each prescription with you, before taking them home. Make sure that your name is accurately spelled, that your address is correct. Check the name and dose of your medication to make sure it is accurate. Check the number of pills, and the written instructions to make sure they are clear and accurate. Make sure that you are given enough medication to last  until your next medication refill appointment. 5. Taking Medication: Take medication as prescribed. Never take more pills than instructed. Never take medication more frequently than prescribed. Taking less pills or less frequently is permitted and encouraged, when it comes to controlled substances (written prescriptions).  6. Inform other Doctors: Always inform, all of your healthcare providers, of all the medications you take. 7. Pain Medication from other Providers: You are not allowed to accept any additional pain medication from any other Doctor or Healthcare provider. There are two exceptions to this rule. (see below) In the event that you require additional pain medication, you are responsible for notifying us, as stated below. 8. Medication Agreement: You are responsible for carefully reading and following our Medication Agreement. This must be signed before receiving any prescriptions from our practice. Safely store a copy of your signed Agreement. Violations to the Agreement will result in no further prescriptions. (Additional copies of our Medication Agreement are available upon request.) 9. Laws, Rules, & Regulations: All patients are expected to follow all Federal and State Laws, Statutes, Rules, & Regulations. Ignorance of the Laws does not constitute a valid excuse. The use of any illegal substances is prohibited. 10. Adopted CDC guidelines & recommendations: Target dosing levels will be at or below 60 MME/day. Use of benzodiazepines** is not recommended.  Exceptions: There are only two exceptions to the rule of not receiving pain medications from other Healthcare Providers. 1. Exception #1 (Emergencies): In the event of an emergency (i.e.: accident requiring emergency care), you are allowed to receive additional pain medication. However, you are responsible for: As soon as you are able, call our office [phone], at any time of the day or night, and leave a message stating your name, the  date and nature of the emergency, and the name and dose of the medication   prescribed. In the event that your call is answered by a member of our staff, make sure to document and save the date, time, and the name of the person that took your information.  2. Exception #2 (Planned Surgery): In the event that you are scheduled by another doctor or dentist to have any type of surgery or procedure, you are allowed (for a period no longer than 30 days), to receive additional pain medication, for the acute post-op pain. However, in this case, you are responsible for picking up a copy of our "Post-op Pain Management for Surgeons" handout, and giving it to your surgeon or dentist. This document is available at our office, and does not require an appointment to obtain it. Simply go to our office during business hours (Monday-Thursday from 8:00 AM to 4:00 PM) (Friday 8:00 AM to 12:00 Noon) or if you have a scheduled appointment with [place], prior to your surgery, and ask for it by name. In addition, you will need to provide [place] with your name, name of your surgeon, type of surgery, and date of procedure or surgery.  *Opioid medications include: morphine, codeine, oxycodone, oxymorphone, hydrocodone, hydromorphone, meperidine, tramadol, tapentadol, buprenorphine, fentanyl, methadone. **Benzodiazepine medications include: diazepam (Valium), alprazolam (Xanax), clonazepam (Klonopine), lorazepam (Ativan), clorazepate (Tranxene), chlordiazepoxide (Librium), estazolam (Prosom), oxazepam (Serax), temazepam (Restoril), triazolam (Halcion) (Last updated: 03/07/2017) ____________________________________________________________________________________________   BMI Assessment: Estimated body mass index is 31.28 kg/m as calculated from the following:   Height as of this encounter: 5\' 2"  (1.575 m).   Weight as of this encounter: 171 lb (77.6 kg).  BMI interpretation table: BMI level Category Range association with higher  incidence of chronic pain  <18 kg/m2 Underweight   18.5-24.9 kg/m2 Ideal body weight   25-29.9 kg/m2 Overweight Increased incidence by 20%  30-34.9 kg/m2 Obese (Class I) Increased incidence by 68%  35-39.9 kg/m2 Severe obesity (Class II) Increased incidence by 136%  >40 kg/m2 Extreme obesity (Class III) Increased incidence by 254%   Patient's current BMI Ideal Body weight  Body mass index is 31.28 kg/m. Ideal body weight: 50.1 kg (110 lb 7.2 oz) Adjusted ideal body weight: 61.1 kg (134 lb 10.7 oz)   BMI Readings from Last 4 Encounters:  07/15/17 31.28 kg/m  07/12/17 30.73 kg/m  07/05/17 31.83 kg/m  06/27/17 31.28 kg/m   Wt Readings from Last 4 Encounters:  07/15/17 171 lb (77.6 kg)  07/12/17 168 lb (76.2 kg)  07/05/17 174 lb (78.9 kg)  06/27/17 171 lb (77.6 kg)

## 2017-07-16 ENCOUNTER — Telehealth: Payer: Self-pay | Admitting: Nurse Practitioner

## 2017-07-16 ENCOUNTER — Telehealth: Payer: Self-pay | Admitting: Family Medicine

## 2017-07-16 DIAGNOSIS — R519 Headache, unspecified: Secondary | ICD-10-CM

## 2017-07-16 DIAGNOSIS — R51 Headache: Principal | ICD-10-CM

## 2017-07-16 DIAGNOSIS — G8929 Other chronic pain: Secondary | ICD-10-CM

## 2017-07-16 MED ORDER — BUTALBITAL-APAP-CAFFEINE 50-325-40 MG PO TABS: 1.0000 | ORAL_TABLET | Freq: Four times a day (QID) | ORAL | 3 refills | Status: AC | PRN

## 2017-07-16 MED ORDER — OXYCODONE-ACETAMINOPHEN 5-325 MG PO TABS: 1.0000 | ORAL_TABLET | Freq: Four times a day (QID) | ORAL | 0 refills | Status: DC | PRN

## 2017-07-16 NOTE — Telephone Encounter (Signed)
**  UPDATE 07/16/17 - spoke with [facility] Dionisio David NP who called back in response to our question, she agrees that we may proceed with acute pain management for this acute injury, as they do not treat the acute injury pain. Patient did follow the appropriate channels after her acute injury and has followed up appropriately. We agree that good option would be percocet 5/325mg  for acute 5 day supply for dosing 1 pill every 6 hours PRN pain, #20 pills for 5 day supply 0 refills.  Called patient, spoke with [name], reviewed above message, agree for acute pain medicine course, Sent e-script to her [employer]. She understands this is a one time rx for this issue in particular, in future if any recurrent acute pain rx needed we will need to get permission again from pain management**  See office visit from 07/15/17 for full clinical history  Nobie Putnam, [facility] 07/16/2017, 11:10 AM

## 2017-07-16 NOTE — Telephone Encounter (Signed)
Also verbally confirmed with [place] that this was approved by myself and pain management provider Dionisio David NP  Nobie Putnam, [facility] 07/16/2017, 11:16 AM

## 2017-07-16 NOTE — Telephone Encounter (Signed)
She was given the letter for the acute pain.  If they fell she needs additional pain medication then they may prescribe. I will not be making any adjustments in her medication.

## 2017-07-16 NOTE — Addendum Note (Signed)
Addended by: Olin Hauser on: 07/16/2017 11:09 AM   Modules accepted: Orders

## 2017-07-16 NOTE — Telephone Encounter (Signed)
[place] with [employer] said the butalbital capsules will cost the pt $200 but would only be $30 if changed to tablets 5325-40.  The call back number is [phone]

## 2017-07-16 NOTE — Telephone Encounter (Signed)
Crystal spoke with Dr. Raliegh Ip 07/16/2017 AM

## 2017-07-16 NOTE — Telephone Encounter (Signed)
Switched rx - new rx sent tablets  Nobie Putnam, [facility] 07/16/2017, 10:07 AM

## 2017-07-16 NOTE — Telephone Encounter (Signed)
Dr Parks Ranger would like to speak with Crystal this morning if possible about pain meds for Earlena. [phone]

## 2017-07-17 ENCOUNTER — Ambulatory Visit: Payer: Medicare HMO

## 2017-07-18 ENCOUNTER — Telehealth: Payer: Self-pay | Admitting: Family Medicine

## 2017-07-18 ENCOUNTER — Ambulatory Visit: Admission: RE | Admit: 2017-07-18 | Payer: Medicare HMO | Source: Ambulatory Visit

## 2017-07-18 NOTE — Telephone Encounter (Signed)
[place] called states that pt did not come to her appt. today

## 2017-07-18 NOTE — Telephone Encounter (Signed)
Left detail message. 

## 2017-07-18 NOTE — Telephone Encounter (Signed)
Please call patient to figure out why she missed her appointment for [facility].  She was scheduled to have [facility] Ultrasound for routine yearly surveillance of her Liver. This can be re-scheduled if she is interested.  Nobie Putnam, [facility] 07/18/2017, 1:52 PM

## 2017-07-23 ENCOUNTER — Encounter: Payer: Self-pay | Admitting: Pain Medicine

## 2017-07-23 ENCOUNTER — Other Ambulatory Visit: Payer: Self-pay

## 2017-07-23 ENCOUNTER — Ambulatory Visit (HOSPITAL_BASED_OUTPATIENT_CLINIC_OR_DEPARTMENT_OTHER): Payer: Medicare HMO | Admitting: Pain Medicine

## 2017-07-23 ENCOUNTER — Ambulatory Visit
Admission: RE | Admit: 2017-07-23 | Discharge: 2017-07-23 | Disposition: A | Discharge status: Home / Self Care | Payer: Medicare HMO | Source: Ambulatory Visit | Attending: Pain Medicine | Admitting: Pain Medicine

## 2017-07-23 VITALS — BP 143/68 | HR 64 | Temp 97.3°F | Resp 15 | Ht 62.0 in | Wt 169.0 lb

## 2017-07-23 DIAGNOSIS — M5441 Lumbago with sciatica, right side: Secondary | ICD-10-CM | POA: Insufficient documentation

## 2017-07-23 DIAGNOSIS — M5136 Other intervertebral disc degeneration, lumbar region: Secondary | ICD-10-CM | POA: Insufficient documentation

## 2017-07-23 DIAGNOSIS — M79605 Pain in left leg: Secondary | ICD-10-CM

## 2017-07-23 DIAGNOSIS — M961 Postlaminectomy syndrome, not elsewhere classified: Secondary | ICD-10-CM

## 2017-07-23 DIAGNOSIS — M549 Dorsalgia, unspecified: Secondary | ICD-10-CM | POA: Insufficient documentation

## 2017-07-23 DIAGNOSIS — Z9689 Presence of other specified functional implants: Secondary | ICD-10-CM | POA: Insufficient documentation

## 2017-07-23 DIAGNOSIS — G8929 Other chronic pain: Secondary | ICD-10-CM

## 2017-07-23 DIAGNOSIS — M79672 Pain in left foot: Secondary | ICD-10-CM | POA: Diagnosis not present

## 2017-07-23 DIAGNOSIS — Z888 Allergy status to other drugs, medicaments and biological substances status: Secondary | ICD-10-CM | POA: Diagnosis not present

## 2017-07-23 DIAGNOSIS — Z881 Allergy status to other antibiotic agents status: Secondary | ICD-10-CM | POA: Diagnosis not present

## 2017-07-23 DIAGNOSIS — M5442 Lumbago with sciatica, left side: Secondary | ICD-10-CM | POA: Diagnosis not present

## 2017-07-23 DIAGNOSIS — G9619 Other disorders of meninges, not elsewhere classified: Secondary | ICD-10-CM | POA: Diagnosis not present

## 2017-07-23 DIAGNOSIS — Z7982 Long term (current) use of aspirin: Secondary | ICD-10-CM | POA: Diagnosis not present

## 2017-07-23 DIAGNOSIS — Z79899 Other long term (current) drug therapy: Secondary | ICD-10-CM | POA: Diagnosis not present

## 2017-07-23 DIAGNOSIS — M79604 Pain in right leg: Secondary | ICD-10-CM

## 2017-07-23 DIAGNOSIS — Z79891 Long term (current) use of opiate analgesic: Secondary | ICD-10-CM | POA: Diagnosis not present

## 2017-07-23 DIAGNOSIS — Z9884 Bariatric surgery status: Secondary | ICD-10-CM | POA: Insufficient documentation

## 2017-07-23 DIAGNOSIS — G96198 Other disorders of meninges, not elsewhere classified: Secondary | ICD-10-CM

## 2017-07-23 DIAGNOSIS — Z9049 Acquired absence of other specified parts of digestive tract: Secondary | ICD-10-CM | POA: Diagnosis not present

## 2017-07-23 MED ORDER — SODIUM CHLORIDE 0.9% FLUSH
4.0000 mL | Freq: Once | INTRAVENOUS | Status: AC
  Administered 2017-07-23: 4 mL

## 2017-07-23 MED ORDER — HYALURONIDASE HUMAN 150 UNIT/ML IJ SOLN
1500.0000 [IU] | Freq: Once | INTRAMUSCULAR | Status: AC
  Administered 2017-07-23: 1500 [IU] via INTRADERMAL
  Filled 2017-07-23: qty 10

## 2017-07-23 MED ORDER — CEFAZOLIN SODIUM 1 G IJ SOLR
INTRAMUSCULAR | Status: AC
  Filled 2017-07-23: qty 10

## 2017-07-23 MED ORDER — ROPIVACAINE HCL 2 MG/ML IJ SOLN
4.0000 mL | Freq: Once | INTRAMUSCULAR | Status: AC
  Administered 2017-07-23: 4 mL via EPIDURAL
  Filled 2017-07-23: qty 10

## 2017-07-23 MED ORDER — CEFAZOLIN SODIUM-DEXTROSE 1-4 GM/50ML-% IV SOLN
1.0000 g | Freq: Once | INTRAVENOUS | Status: AC
  Administered 2017-07-23: 1 g via INTRAVENOUS

## 2017-07-23 MED ORDER — FENTANYL CITRATE (PF) 100 MCG/2ML IJ SOLN
25.0000 ug | INTRAMUSCULAR | Status: DC | PRN
  Administered 2017-07-23: 100 ug via INTRAVENOUS
  Filled 2017-07-23: qty 2

## 2017-07-23 MED ORDER — STERILE WATER FOR INJECTION IJ SOLN
10.0000 mL | Freq: Once | INTRAVENOUS | Status: DC
  Filled 2017-07-23: qty 4.27

## 2017-07-23 MED ORDER — LACTATED RINGERS IV SOLN
1000.0000 mL | Freq: Once | INTRAVENOUS | Status: AC
  Administered 2017-07-23: 1000 mL via INTRAVENOUS

## 2017-07-23 MED ORDER — MIDAZOLAM HCL 5 MG/5ML IJ SOLN
1.0000 mg | INTRAMUSCULAR | Status: DC | PRN
  Administered 2017-07-23: 3 mg via INTRAVENOUS
  Filled 2017-07-23: qty 5

## 2017-07-23 MED ORDER — IOPAMIDOL (ISOVUE-M 200) INJECTION 41%
10.0000 mL | Freq: Once | INTRAMUSCULAR | Status: AC
  Administered 2017-07-23: 10 mL via EPIDURAL
  Filled 2017-07-23: qty 10

## 2017-07-23 MED ORDER — TRIAMCINOLONE ACETONIDE 40 MG/ML IJ SUSP
40.0000 mg | Freq: Once | INTRAMUSCULAR | Status: AC
  Administered 2017-07-23: 40 mg
  Filled 2017-07-23: qty 1

## 2017-07-23 MED ORDER — LIDOCAINE HCL (PF) 2 % IJ SOLN
10.0000 mL | Freq: Once | INTRAMUSCULAR | Status: AC
  Administered 2017-07-23: 10 mL
  Filled 2017-07-23: qty 10

## 2017-07-23 MED ORDER — LIDOCAINE-EPINEPHRINE (PF) 2 %-1:200000 IJ SOLN
10.0000 mL | Freq: Once | INTRAMUSCULAR | Status: AC
  Administered 2017-07-23: 10 mL
  Filled 2017-07-23: qty 20

## 2017-07-23 NOTE — Progress Notes (Signed)
Patient's Name: Ashley Fields  MRN: [id number]  Referring Provider: Milinda Pointer, MD  DOB: [birth year]  PCP: Olin Hauser, DO  DOS: 07/23/2017  Note by: Gaspar Cola, MD  Service setting: Ambulatory outpatient  Specialty: Interventional Pain Management  Patient type: Established  Location: [facility] (AMB) Pain Management Facility  Visit type: Interventional Procedure   Primary Reason for Visit: Interventional Pain Management Treatment. CC: Back Pain (low) and Foot Pain (left)  Procedure:          Anesthesia, Analgesia, Anxiolysis:  Type: Therapeutic Percutaneous Epidural Neuroplasty and Lysis of Adhesions (RACZ Procedure) Region: Caudal Level: Sacrococcygeal   Laterality: Midline         Type: Moderate (Conscious) Sedation combined with Local Anesthesia Indication(s): Analgesia and Anxiety Route: Intravenous (IV) IV Access: Secured Sedation: Meaningful verbal contact was maintained at all times during the procedure  Local Anesthetic: Lidocaine 1-2%   Indications: 1. DDD (degenerative disc disease), lumbar   2. Failed back surgical syndrome   3. Chronic lower extremity pain (Tertiary Area of Pain) (Bilateral) (L>R)   4. Chronic Low back pain (Primary Area of Pain) (Bilateral)  (R>L)   5. Epidural fibrosis    Pain Score: Pre-procedure: 2 /10 Post-procedure: 0-No pain/10  Pre-op Assessment:  Ashley Fields is a 62 y.o. (year old), female patient, seen today for interventional treatment. She  has a past surgical history that includes Cesarean section (07/27/1981); Abdominal hysterectomy (1987); Breast reduction surgery (Bilateral, 1997); Cholecystectomy (03/2004); Appendectomy (12/2008); Gastric bypass (2005); mini tummy tuck (05/07/2016); breast lift bilateral, implants (05/18/2015); Lumbar laminectomy (06/2006); 5 miscarriages; neck surgery C3-C7 (02/10/2015); IVC FILTER INSERTION (12/2003); Spinal cord stimulator implant (11/2010); Shoulder acromioplasty (Left, 03/27/2013);  Reduction mammaplasty (Bilateral, 1997); Augmentation mammaplasty (Bilateral, 2015); and Augmentation mammaplasty (Bilateral, 2018). Ashley Fields has a current medication list which includes the following prescription(s): amoxicillin-clavulanate, aripiprazole, aspirin ec, biotin, butalbital-acetaminophen-caffeine, cholecalciferol, difluprednate, doxycycline, estradiol, etodolac, ezetimibe, ferrous sulfate, fluconazole, fluticasone, gabapentin, lorazepam, magnesium, melatonin, multivitamin with minerals, neomycin-polymyxin-hydrocortisone, omeprazole, ondansetron, oxycodone-acetaminophen, rosuvastatin, sertraline, tizanidine, tramadol, trazodone, turmeric, vitamin b-12, and vitamin e, and the following Facility-Administered Medications: fentanyl, midazolam, and sodium chloride hypertonic 10% epidural injection. Her primarily concern today is the Back Pain (low) and Foot Pain (left)  Initial Vital Signs:  Pulse/HCG Rate: 64ECG Heart Rate: 67 Temp: 98.5 F (36.9 C) Resp: 18 BP: 127/77 SpO2: 98 %  BMI: Estimated body mass index is 30.91 kg/m as calculated from the following:   Height as of this encounter: 5' 2"  (1.575 m).   Weight as of this encounter: 169 lb (76.7 kg).  Risk Assessment: Allergies: Reviewed. She is allergic to flagyl [metronidazole] and tape.  Allergy Precautions: None required Coagulopathies: Reviewed. None identified.  Blood-thinner therapy: None at this time Active Infection(s): Reviewed. None identified. Ashley Fields is afebrile  Site Confirmation: Ashley Fields was asked to confirm the procedure and laterality before marking the site Procedure checklist: Completed Consent: Before the procedure and under the influence of no sedative(s), amnesic(s), or anxiolytics, the patient was informed of the treatment options, risks and possible complications. To fulfill our ethical and legal obligations, as recommended by the American Medical Association's Code of Ethics, I have informed the  patient of my clinical impression; the nature and purpose of the treatment or procedure; the risks, benefits, and possible complications of the intervention; the alternatives, including doing nothing; the risk(s) and benefit(s) of the alternative treatment(s) or procedure(s); and the risk(s) and benefit(s) of doing nothing. The patient was provided information about the general risks  and possible complications associated with the procedure. These may include, but are not limited to: failure to achieve desired goals, infection, bleeding, organ or nerve damage, allergic reactions, paralysis, and death. In addition, the patient was informed of those risks and complications associated to Spine-related procedures, such as failure to decrease pain; infection (i.e.: Meningitis, epidural or intraspinal abscess); bleeding (i.e.: epidural hematoma, subarachnoid hemorrhage, or any other type of intraspinal or peri-dural bleeding); organ or nerve damage (i.e.: Any type of peripheral nerve, nerve root, or spinal cord injury) with subsequent damage to sensory, motor, and/or autonomic systems, resulting in permanent pain, numbness, and/or weakness of one or several areas of the body; allergic reactions; (i.e.: anaphylactic reaction); and/or death. Furthermore, the patient was informed of those risks and complications associated with the medications. These include, but are not limited to: allergic reactions (i.e.: anaphylactic or anaphylactoid reaction(s)); adrenal axis suppression; blood sugar elevation that in diabetics may result in ketoacidosis or comma; water retention that in patients with history of congestive heart failure may result in shortness of breath, pulmonary edema, and decompensation with resultant heart failure; weight gain; swelling or edema; medication-induced neural toxicity; particulate matter embolism and blood vessel occlusion with resultant organ, and/or nervous system infarction; and/or aseptic necrosis  of one or more joints. Finally, the patient was informed that Medicine is not an exact science; therefore, there is also the possibility of unforeseen or unpredictable risks and/or possible complications that may result in a catastrophic outcome. The patient indicated having understood very clearly. We have given the patient no guarantees and we have made no promises. Enough time was given to the patient to ask questions, all of which were answered to the patient's satisfaction. Ashley Fields has indicated that she wanted to continue with the procedure. Attestation: I, the ordering provider, attest that I have discussed with the patient the benefits, risks, side-effects, alternatives, likelihood of achieving goals, and potential problems during recovery for the procedure that I have provided informed consent. Date   Time: 07/23/2017  7:42 AM  Pre-Procedure Preparation:  Monitoring: As per clinic protocol. Respiration, ETCO2, SpO2, BP, heart rate and rhythm monitor placed and checked for adequate function Safety Precautions: Patient was assessed for positional comfort and pressure points before starting the procedure. Time-out: I initiated and conducted the "Time-out" before starting the procedure, as per protocol. The patient was asked to participate by confirming the accuracy of the "Time Out" information. Verification of the correct person, site, and procedure were performed and confirmed by me, the nursing staff, and the patient. "Time-out" conducted as per Joint Commission's Universal Protocol (UP.01.01.01). Time: 0901  Description of Procedure:          Position: Prone  Target Area: Caudal Epidural Canal Approach: Midline approach Area Prepped: Entire Posterior Sacrococcygeal Region Prepping solution: ChloraPrep (2% chlorhexidine gluconate and 70% isopropyl alcohol) Safety Precautions: Aspiration looking for blood return was conducted prior to all injections. At no point did I inject any  substances, as a needle was being advanced. No attempts were made at seeking any paresthesias. Safe injection practices and needle disposal techniques used. Medications properly checked for expiration dates. SDV (single dose vial) medications used. Description of the Procedure: Protocol guidelines were followed. The patient was placed in position over the fluoroscopy table. Patient assessed for comfort and pressure points before starting procedure. The target area was identified and the area prepped in the usual manner. Skin & deeper tissues infiltrated with local anesthetic. Appropriate amount of time allowed to pass for  local anesthetics to take effect. The epidural needle was then advanced to the target area, via the sacral hiatus, between the sacral cornu. Proper needle placement secured. Negative aspiration confirmed. Step 1: Epidurogram performed by slowly injecting a non-ionic, water-soluble, hypoallergenic, myelogram-compatible radiological contrast. Defect identified and Racz catheter advanced slowly next to rest proximal to it without attempting to perforate or puncture the defect. At this point, the epidural needle was removed. Step 2: Contrast was again injected, this time thru the catheter, under live fluoroscopy, closely observing for vascular uptake or intrathecal spread. Step 3: Once no vascular uptake or intrathecal spread was confirmed, a 2 mL test-dose using 2% PF-Lidocaine with 1:200,000 Epinephrine was injected thru the catheter, while closely monitoring for an increase in heart rate or evidence of spinal anesthesia. Step 4: After waiting over 5 minutes, the patient was assessed for evidence of a spinal block. Step 5: Once I had confirmed that there was no vascular uptake or evidence of intrathecal spread, I then slowly injected 1,500 Units of hyaluronidase, carefully monitoring for allergic reactions. I then waited 10 minutes, using this time to secure the catheter using a sterile benzoin  tincture swab and (45m x 100 mm) steri-strip. After confirming vitals to be stable, the patient was transferred to the recovery area where [name] was kept under constant observation and monitored as per post-sedation protocol. Step 6: After the 10 minutes, I proceeded to slowly inject a solution containing 0.2% MPF-Ropivacaine (4 mL) + 0.9% PF-NSS (5 mL) + SDV Triamcinolone 40 mg/mL (1 mL), in intermittent fashion, asking for systemic symptoms every 0.5cc of injectate. Step 7:  30 minutes later, a neurological exam was conducted for any evidence of a spinal blockade. Step 8:  After confirming the absence of anesthesia, I slowly injected the 10% PF-Hypertonic Saline, stopping to assess, any time the patient described any discomfort. Once the procedure was completed, I removed the catheter, taking time to show the patient its spring tip, and demonstrating to the patient that none had been left behind. EBL: None Materials & Medications used:  1. Racz Tun-L-Kath ([place], [place] Cathe[place]. (or similar)(Perifix 20Gx100cm) 2. 2 Foam Tape 3. Benzoin tincture swab 4. Steri-Strip (131mx 100 mm) 5. Non-occlusive Transparent Dressing (5M Tegaderm) 6. Bacteriostatic Filter for Epidural Catheter 7. Epidural Kit for 20G catheter Needle(s) used: 20g - 10cm, Tuohy-style epidural needle   Medications used:  1. Skin infiltration: 2.0% Lidocaine (1030m2. Test-dose: 1.5 % Lidocaine w/ Epi 1:200,000 (5ml41m. Epidural Steroid injection:  A). Steroid: Triamcinolone 40 mg/mL (SDV) (1ml)67m. Local Anesthetic: 0.2% PF-Ropivacaine (2 mg/mL) (4 mL) C). Dilution agent: 0.9% PF-NSS (injectable saline) (5 mL) 6. Neurolytic Agent: 10% PF-Sodium Chloride (Hypertonic Saline) (10ml)43m.4% PF-Sodium Chloride (4.273mL) 49m-Sterile H2O (5.727mL) =48m PF-Sodium Chloride (10mL)] 731mar softening agent: Hyaluronidase (150 units/mL) x (10 mL) = 1500 Units 8. Radiological Contrast Media: Isovue-M 200 (10  mL)  Vitals:   07/23/17 0950 07/23/17 1000 07/23/17 1020 07/23/17 1030  BP: 137/69 (!) 141/71 139/71 (!) 143/68  Pulse:      Resp: 17 16 16 15   Temp:    (!) 97.3 F (36.3 C)  TempSrc:      SpO2: 99% 98% 99% 98%  Weight:      Height:        Start Time: 0901 hrs. End Time:   hrs. Epidurogram:       Epidurogram flow pattern demonstrated a restricted low at the level of the surgery. The epidural  catheter was placed next to this restriction without attempting to perforate it. Contrast was observed to flow in the cephalad direction within the posterior caudal canal encountering blockage to the flow at mid body of L5. Since the patient's symptoms corresponded to a left L5 radiculitis, the catheter was inserted and positioned right at the point where the flow of contrast stopped. Materials:  Needle(s) Type: Epidural needle Gauge: 20G Length: 3.5-in Medication(s): Please see orders for medications and dosing details.  Imaging Guidance (Spinal):          Type of Imaging Technique: Fluoroscopy Guidance (Spinal) Indication(s): Assistance in needle guidance and placement for procedures requiring needle placement in or near specific anatomical locations not easily accessible without such assistance. Exposure Time: Please see nurses notes. Contrast: Before injecting any contrast, we confirmed that the patient did not have an allergy to iodine, shellfish, or radiological contrast. Once satisfactory needle placement was completed at the desired level, radiological contrast was injected. Contrast injected under live fluoroscopy. No contrast complications. See chart for type and volume of contrast used. Fluoroscopic Guidance: I was personally present during the use of fluoroscopy. "Tunnel Vision Technique" used to obtain the best possible view of the target area. Parallax error corrected before commencing the procedure. "Direction-depth-direction" technique used to introduce the needle under continuous  pulsed fluoroscopy. Once target was reached, antero-posterior, oblique, and lateral fluoroscopic projection used confirm needle placement in all planes. Images permanently stored in EMR. Interpretation: I personally interpreted the imaging intraoperatively. Adequate needle placement confirmed in multiple planes. Appropriate spread of contrast into desired area was observed. No evidence of afferent or efferent intravascular uptake. No intrathecal or subarachnoid spread observed. Permanent images saved into the patient's record.  Antibiotic Prophylaxis:   Anti-infectives (From admission, onward)   Start     Dose/Rate Route Frequency Ordered Stop   07/23/17 0800  ceFAZolin (ANCEF) IVPB 1 g/50 mL premix     1 g 100 mL/hr over 30 Minutes Intravenous  Once 07/23/17 0757 07/23/17 0854     Indication(s): None identified  Post-operative Assessment:  Post-procedure Vital Signs:  Pulse/HCG Rate: 6463 Temp: (!) 97.3 F (36.3 C) Resp: 15 BP: (!) 143/68 SpO2: 98 %  EBL: None  Complications: No immediate post-treatment complications observed by team, or reported by patient.  Note: The patient tolerated the entire procedure well. A repeat set of vitals were taken after the procedure and the patient was kept under observation following institutional policy, for this type of procedure. Post-procedural neurological assessment was performed, showing return to baseline, prior to discharge. The patient was provided with post-procedure discharge instructions, including a section on how to identify potential problems. Should any problems arise concerning this procedure, the patient was given instructions to immediately contact us, at any time, without hesitation. In any case, we plan to contact the patient by telephone for a follow-up status report regarding this interventional procedure. The patient was able to void without any problems or complications before she was discharged home.  Comments:  No additional  relevant information.  Plan of Care    Imaging Orders     DG C-Arm 1-60 Min-No Report  Procedure Orders     Racz (One Day)  Medications ordered for procedure: Meds ordered this encounter  Medications   iopamidol (ISOVUE-M) 41 % intrathecal injection 10 mL    Must be Myelogram-compatible. If not available, you may substitute with a water-soluble, non-ionic, hypoallergenic, myelogram-compatible radiological contrast medium.   midazolam (VERSED) 5 MG/5ML injection 1-2 mg  Make sure Flumazenil is available in the pyxis when using this medication. If oversedation occurs, administer 0.2 mg IV over 15 sec. If after 45 sec no response, administer 0.2 mg again over 1 min; may repeat at 1 min intervals; not to exceed 4 doses (1 mg)   fentaNYL (SUBLIMAZE) injection 25-50 mcg    Make sure Narcan is available in the pyxis when using this medication. In the event of respiratory depression (RR< 8/min): Titrate NARCAN (naloxone) in increments of 0.1 to 0.2 mg IV at 2-3 minute intervals, until desired degree of reversal.   lactated ringers infusion 1,000 mL   ceFAZolin (ANCEF) IVPB 1 g/50 mL premix    Order Specific Question:   Antibiotic Indication:    Answer:   Surgical Prophylaxis   lidocaine (XYLOCAINE) 2 % injection 10 mL    If 2.0 % is unavailable, it may be substituted with 1.5 %. Always notify physician of changes before procedure.   lidocaine-EPINEPHrine (XYLOCAINE W/EPI) 2 %-1:200000 (PF) injection 10 mL    If 2% is unavailable, may be substituted with 1.5%, but must be preservative-free. If 1:200,000 concentration of epinephrine is not available, it may be substituted with 1:100,000. Notify physician of changes, before procedure.   ropivacaine (PF) 2 mg/mL (0.2%) (NAROPIN) injection 4 mL   triamcinolone acetonide (KENALOG-40) injection 40 mg   sodium chloride flush (NS) 0.9 % injection 4 mL   hyaluronidase Human (HYLENEX) injection 1,500 Units    10 mL of the 150 Units/mL    sodium chloride hypertonic 10% epidural injection    For Racz Epidural Lysis of Adhesions.   Medications administered: We administered iopamidol, midazolam, fentaNYL, lactated ringers, ceFAZolin, lidocaine, lidocaine-EPINEPHrine, ropivacaine (PF) 2 mg/mL (0.2%), triamcinolone acetonide, sodium chloride flush, and hyaluronidase Human.  See the medical record for exact dosing, route, and time of administration.  New Prescriptions   No medications on file   Disposition: Discharge home  Discharge Date & Time: 07/23/2017; 1047 hrs.   Physician-requested Follow-up: Return for post-procedure eval (2 wks), w/ Dr. Dossie Arbour.  Future Appointments  Date Time Provider [place]  07/30/2017  8:45 AM Crivitz Kennard None  08/05/2017  1:30 PM Milinda Pointer, MD [facility] None  08/08/2017  9:00 AM Olin Hauser, DO [place] None  09/12/2017  8:45 AM Vevelyn Francois, NP [facility] None   Primary Care Physician: Olin Hauser, DO Location: [facility] Note by: Gaspar Cola, MD Date: 07/23/2017; Time: 11:06 AM  Disclaimer:  Medicine is not an Chief Strategy Officer. The only guarantee in medicine is that nothing is guaranteed. It is important to note that the decision to proceed with this intervention was based on the information collected from the patient. The Data and conclusions were drawn from the patient's questionnaire, the interview, and the physical examination. Because the information was provided in large part by the patient, it cannot be guaranteed that it has not been purposely or unconsciously manipulated. Every effort has been made to obtain as much relevant data as possible for this evaluation. It is important to note that the conclusions that lead to this procedure are derived in large part from the available data. Always take into account that the treatment will also be dependent on availability of resources and existing treatment  guidelines, considered by other Pain Management Practitioners as being common knowledge and practice, at the time of the intervention. For Medico-Legal purposes, it is also important to point out that variation in procedural techniques and pharmacological  choices are the acceptable norm. The indications, contraindications, technique, and results of the above procedure should only be interpreted and judged by a Board-Certified Interventional Pain Specialist with extensive familiarity and expertise in the same exact procedure and technique.

## 2017-07-23 NOTE — Progress Notes (Signed)
Safety precautions to be maintained throughout the outpatient stay will include: orient to surroundings, keep bed in low position, maintain call bell within reach at all times, provide assistance with transfer out of bed and ambulation.  

## 2017-07-23 NOTE — Patient Instructions (Signed)

## 2017-07-24 ENCOUNTER — Encounter: Payer: Self-pay | Admitting: Family Medicine

## 2017-07-24 NOTE — Progress Notes (Signed)
Received fax on 07/22/17 - [facility] - sample could not be processed. Patient will be contacted by them to resubmit a new sample.  Nobie Putnam, [facility] 07/24/2017, 4:48 PM

## 2017-07-25 ENCOUNTER — Other Ambulatory Visit: Payer: Self-pay | Admitting: Family Medicine

## 2017-07-25 ENCOUNTER — Encounter: Payer: Self-pay | Admitting: Family Medicine

## 2017-07-25 DIAGNOSIS — F4310 Post-traumatic stress disorder, unspecified: Secondary | ICD-10-CM

## 2017-07-28 ENCOUNTER — Encounter: Payer: Self-pay | Admitting: Pain Medicine

## 2017-07-29 ENCOUNTER — Encounter: Payer: Self-pay | Admitting: Family Medicine

## 2017-07-30 ENCOUNTER — Other Ambulatory Visit: Payer: Medicare HMO

## 2017-07-30 ENCOUNTER — Encounter: Payer: Self-pay | Admitting: Pain Medicine

## 2017-07-30 DIAGNOSIS — Z9884 Bariatric surgery status: Secondary | ICD-10-CM

## 2017-07-30 DIAGNOSIS — R7309 Other abnormal glucose: Secondary | ICD-10-CM

## 2017-07-30 DIAGNOSIS — R69 Illness, unspecified: Secondary | ICD-10-CM | POA: Diagnosis not present

## 2017-07-30 DIAGNOSIS — E538 Deficiency of other specified B group vitamins: Secondary | ICD-10-CM

## 2017-07-30 DIAGNOSIS — E782 Mixed hyperlipidemia: Secondary | ICD-10-CM

## 2017-07-30 DIAGNOSIS — Z0000 Encounter for general adult medical examination without abnormal findings: Secondary | ICD-10-CM | POA: Diagnosis not present

## 2017-07-30 DIAGNOSIS — Z79899 Other long term (current) drug therapy: Secondary | ICD-10-CM | POA: Diagnosis not present

## 2017-07-30 DIAGNOSIS — E559 Vitamin D deficiency, unspecified: Secondary | ICD-10-CM | POA: Diagnosis not present

## 2017-07-30 DIAGNOSIS — F4310 Post-traumatic stress disorder, unspecified: Secondary | ICD-10-CM

## 2017-07-30 DIAGNOSIS — K909 Intestinal malabsorption, unspecified: Secondary | ICD-10-CM

## 2017-07-31 ENCOUNTER — Encounter: Payer: Self-pay | Admitting: Family Medicine

## 2017-07-31 ENCOUNTER — Encounter: Payer: Self-pay | Admitting: Cardiovascular Disease

## 2017-07-31 ENCOUNTER — Encounter: Payer: Self-pay | Admitting: Pain Medicine

## 2017-07-31 LAB — COMPLETE METABOLIC PANEL WITH GFR
AG RATIO: 1.8 (calc) (ref 1.0–2.5)
ALBUMIN MSPROF: 4.4 g/dL (ref 3.6–5.1)
ALT: 16 U/L (ref 6–29)
AST: 23 U/L (ref 10–35)
Alkaline phosphatase (APISO): 57 U/L (ref 33–130)
BILIRUBIN TOTAL: 0.6 mg/dL (ref 0.2–1.2)
BUN: 23 mg/dL (ref 7–25)
CHLORIDE: 103 mmol/L (ref 98–110)
CO2: 29 mmol/L (ref 20–32)
Calcium: 9.3 mg/dL (ref 8.6–10.4)
Creat: 0.98 mg/dL (ref 0.50–0.99)
GFR, EST AFRICAN AMERICAN: 72 mL/min/{1.73_m2} (ref >=60)
GFR, Est Non African American: 62 mL/min/{1.73_m2} (ref >=60)
Globulin: 2.5 g/dL (calc) (ref 1.9–3.7)
Glucose, Bld: 98 mg/dL (ref 65–99)
POTASSIUM: 5.8 mmol/L — AB (ref 3.5–5.3)
SODIUM: 143 mmol/L (ref 135–146)
TOTAL PROTEIN: 6.9 g/dL (ref 6.1–8.1)

## 2017-07-31 LAB — IRON,TIBC AND FERRITIN PANEL
%SAT: 16 % (calc) (ref 16–45)
Ferritin: 25 ng/mL (ref 16–288)
Iron: 65 ug/dL (ref 45–160)
TIBC: 407 mcg/dL (calc) (ref 250–450)

## 2017-07-31 LAB — LIPID PANEL
Cholesterol: 142 mg/dL (ref <200)
HDL: 79 mg/dL (ref >50)
LDL Cholesterol (Calc): 49 mg/dL (calc)
NON-HDL CHOLESTEROL (CALC): 63 mg/dL (ref <130)
TRIGLYCERIDES: 68 mg/dL (ref <150)
Total CHOL/HDL Ratio: 1.8 (calc) (ref <5.0)

## 2017-07-31 LAB — CBC WITH DIFFERENTIAL/PLATELET
BASOS ABS: 30 {cells}/uL (ref 0–200)
Basophils Relative: 0.6 %
Eosinophils Absolute: 120 cells/uL (ref 15–500)
Eosinophils Relative: 2.4 %
HCT: 39 % (ref 35.0–45.0)
Hemoglobin: 12.9 g/dL (ref 11.7–15.5)
Lymphs Abs: 1085 cells/uL (ref 850–3900)
MCH: 29.8 pg (ref 27.0–33.0)
MCHC: 33.1 g/dL (ref 32.0–36.0)
MCV: 90.1 fL (ref 80.0–100.0)
MONOS PCT: 6.8 %
MPV: 10.2 fL (ref 7.5–12.5)
NEUTROS PCT: 68.5 %
Neutro Abs: 3425 cells/uL (ref 1500–7800)
Platelets: 163 10*3/uL (ref 140–400)
RBC: 4.33 10*6/uL (ref 3.80–5.10)
RDW: 13.6 % (ref 11.0–15.0)
TOTAL LYMPHOCYTE: 21.7 %
WBC mixed population: 340 cells/uL (ref 200–950)
WBC: 5 10*3/uL (ref 3.8–10.8)

## 2017-07-31 LAB — T4, FREE: FREE T4: 1 ng/dL (ref 0.8–1.8)

## 2017-07-31 LAB — HEMOGLOBIN A1C
EAG (MMOL/L): 5.2 (calc)
HEMOGLOBIN A1C: 4.9 %{Hb} (ref <5.7)
Mean Plasma Glucose: 94 (calc)

## 2017-07-31 LAB — VITAMIN D 25 HYDROXY (VIT D DEFICIENCY, FRACTURES): VIT D 25 HYDROXY: 37 ng/mL (ref 30–100)

## 2017-07-31 LAB — TSH: TSH: 1.88 mIU/L (ref 0.40–4.50)

## 2017-07-31 LAB — VITAMIN B12: Vitamin B-12: 989 pg/mL (ref 200–1100)

## 2017-08-02 ENCOUNTER — Ambulatory Visit
Admission: RE | Admit: 2017-08-02 | Discharge: 2017-08-02 | Disposition: A | Discharge status: Home / Self Care | Payer: Medicare HMO | Source: Ambulatory Visit | Attending: Family Medicine | Admitting: Family Medicine

## 2017-08-02 DIAGNOSIS — Z8619 Personal history of other infectious and parasitic diseases: Secondary | ICD-10-CM | POA: Diagnosis not present

## 2017-08-02 DIAGNOSIS — B1920 Unspecified viral hepatitis C without hepatic coma: Secondary | ICD-10-CM | POA: Diagnosis not present

## 2017-08-02 DIAGNOSIS — R932 Abnormal findings on diagnostic imaging of liver and biliary tract: Secondary | ICD-10-CM | POA: Diagnosis not present

## 2017-08-02 DIAGNOSIS — Z9049 Acquired absence of other specified parts of digestive tract: Secondary | ICD-10-CM | POA: Insufficient documentation

## 2017-08-03 ENCOUNTER — Encounter: Payer: Self-pay | Admitting: Cardiovascular Disease

## 2017-08-04 NOTE — Progress Notes (Signed)
Patient's Name: Ashley Fields  MRN: [id number]  Referring Provider: Nobie Putnam *  DOB: 1955-01-17  PCP: Olin Hauser, DO  DOS: 08/05/2017  Note by: Gaspar Cola, MD  Service setting: Ambulatory outpatient  Specialty: Interventional Pain Management  Location: [facility] (AMB) Pain Management Facility    Patient type: Established   Primary Reason(s) for Visit: Encounter for post-procedure evaluation of chronic illness with mild to moderate exacerbation CC: Back Pain (low)  HPI  Ashley Fields is a 62 y.o. year old, female patient, who comes today for a post-procedure evaluation. She has Chronic pain syndrome; Chronic neck pain ; Chronic hip pain (Secondary Area of Pain) (Bilateral) (L>R); Long term current use of opiate analgesic; Long term prescription opiate use; Opiate use; Chronic knee pain (Left); Osteoarthritis of hip (Bilateral); Chronic Low back pain (Primary Area of Pain) (Bilateral)  (R>L); Chronic lower extremity pain (Tertiary Area of Pain) (Bilateral) (L>R); Long term prescription benzodiazepine use; Grade 1 Anterolisthesis of L3 over L4; Failed back surgical syndrome; Chronic shoulder pain (Left); History of cervical fusion (ACDF C4-C7); DDD (degenerative disc disease), cervical; Chronic sacroiliac joint pain (Bilateral) (R>L); Lumbar Facet Syndrome (Bilateral) (R>L); Lumbar facet osteoarthritis (Bilateral); DDD (degenerative disc disease), lumbar; History of hepatitis C; Hepatic steatosis; History of nephrolithiasis; Lumbar spinal stenosis (severe) (L3-4); Lumbar lateral recess stenosis (Bilateral) (L3-4); Lumbar foraminal stenosis (L3-4) (Left); PTSD (post-traumatic stress disorder); GERD (gastroesophageal reflux disease); Coccygodynia; Hollenhorst plaque, right eye; Family history of coronary artery disease; Carotid bruit; Mixed hyperlipidemia; Other specified dorsopathies, sacral and sacrococcygeal region; Neurogenic pain; Abnormal intestinal absorption; S/P gastric  bypass; Epidural fibrosis; Cervico-occipital neuralgia of left side; Cervicogenic headache (L); Cervical facet syndrome (L); Spondylosis without myelopathy or radiculopathy, cervical region; and Cervicalgia (L) on their problem list. Her primarily concern today is the Back Pain (low)  Pain Assessment: Location: Lower Back Radiating: leg left goes numb Onset: More than a month ago Duration: Chronic pain Quality: Aching, Dull Severity: 1 /10 (subjective, self-reported pain score)  Note: Reported level is compatible with observation.                         When using our objective Pain Scale, levels between 6 and 10/10 are said to belong in an emergency room, as it progressively worsens from a 6/10, described as severely limiting, requiring emergency care not usually available at an outpatient pain management facility. At a 6/10 level, communication becomes difficult and requires great effort. Assistance to reach the emergency department may be required. Facial flushing and profuse sweating along with potentially dangerous increases in heart rate and blood pressure will be evident. Timing: Constant Modifying factors: RACZ BP: 103/70  HR: 60  Ashley Fields comes in today for post-procedure evaluation after the treatment done on 07/23/2017.  Further details on both, my assessment(s), as well as the proposed treatment plan, please see below.  Post-Procedure Assessment  07/23/2017 Procedure: Therapeutic Percutaneous RACZ Procedure Epidural Neuroplasty and Lysis of Adhesions under fluoroscopic guidance and IV sedation Pre-procedure pain score:  2/10 Post-procedure pain score: 0/10 (100% relief) Influential Factors: BMI: 30.91 kg/m Intra-procedural challenges: None observed.         Assessment challenges: None detected.              Reported side-effects: None.        Post-procedural adverse reactions or complications: None reported         Sedation: Sedation provided. When no sedatives are used,  the analgesic levels obtained  are directly associated to the effectiveness of the local anesthetics. However, when sedation is provided, the level of analgesia obtained during the initial 1 hour following the intervention, is believed to be the result of a combination of factors. These factors may include, but are not limited to: 1. The effectiveness of the local anesthetics used. 2. The effects of the analgesic(s) and/or anxiolytic(s) used. 3. The degree of discomfort experienced by the patient at the time of the procedure. 4. The patients ability and reliability in recalling and recording the events. 5. The presence and influence of possible secondary gains and/or psychosocial factors. Reported result: Relief experienced during the 1st hour after the procedure: 90 % (Ultra-Short Term Relief)            Interpretative annotation: Clinically appropriate result. Analgesia during this period is likely to be Local Anesthetic and/or IV Sedative (Analgesic/Anxiolytic) related.          Effects of local anesthetic: The analgesic effects attained during this period are directly associated to the localized infiltration of local anesthetics and therefore cary significant diagnostic value as to the etiological location, or anatomical origin, of the pain. Expected duration of relief is directly dependent on the pharmacodynamics of the local anesthetic used. Long-acting (4-6 hours) anesthetics used.  Reported result: Relief during the next 4 to 6 hour after the procedure: 70 % (Short-Term Relief)            Interpretative annotation: Clinically appropriate result. Analgesia during this period is likely to be Local Anesthetic-related.          Long-term benefit: Defined as the period of time past the expected duration of local anesthetics (1 hour for short-acting and 4-6 hours for long-acting). With the possible exception of prolonged sympathetic blockade from the local anesthetics, benefits during this period are  typically attributed to, or associated with, other factors such as analgesic sensory neuropraxia, antiinflammatory effects, or beneficial biochemical changes provided by agents other than the local anesthetics.  Reported result: Extended relief following procedure: 90 % (Long-Term Relief)            Interpretative annotation: Clinically appropriate result. Good relief. No permanent benefit expected. Inflammation plays a part in the etiology to the pain.          Current benefits: Defined as reported results that persistent at this point in time.   Analgesia: 90 % Ashley Fields reports that both, extremity and the axial pain improved with the treatment. Function: Ashley Fields reports improvement in function ROM: Ashley Fields reports improvement in ROM Interpretative annotation: Ongoing benefit. Therapeutic benefit observed. Effective therapeutic approach.          Interpretation: Results would suggest a successful palliative intervention.                  Plan:  Set up procedure as a PRN palliative treatment option for this patient.                Laboratory Chemistry  Inflammation Markers (CRP: Acute Phase) (ESR: Chronic Phase) Lab Results  Component Value Date   CRP 0.7 08/14/2016   ESRSEDRATE 9 08/14/2016                         Renal Markers Lab Results  Component Value Date   BUN 23 07/30/2017   CREATININE 0.98 81/85/6314   BCR NOT APPLICABLE 97/02/6376   GFRAA 72 07/30/2017   GFRNONAA 62 07/30/2017  Hepatic Markers Lab Results  Component Value Date   AST 23 07/30/2017   ALT 16 07/30/2017   ALBUMIN 4.9 (H) 08/14/2016                        Neuropathy Markers Lab Results  Component Value Date   HGBA1C 4.9 07/30/2017                        Hematology Parameters Lab Results  Component Value Date   PLT 163 07/30/2017   HGB 12.9 07/30/2017   HCT 39.0 07/30/2017                        CV Markers No results found for: BNP, CKTOTAL, CKMB,  TROPONINI                       Note: Lab results reviewed.  Recent Diagnostic Imaging Results  US Abdomen Limited RUQ CLINICAL DATA:  Hepatitis-C.  EXAM: ULTRASOUND ABDOMEN LIMITED RIGHT UPPER QUADRANT  COMPARISON:  No recent prior.  FINDINGS: Gallbladder:  Cholecystectomy.  Common bile duct:  Diameter: 3.0 mm  Liver:  Increased echogenicity consistent fatty infiltration and/or hepatocellular disease. No focal hepatic abnormality identified. Portal vein is patent on color Doppler imaging with normal direction of blood flow towards the liver.  IMPRESSION: 1.  Cholecystectomy.  No biliary distention.  2. Increased hepatic echogenicity consistent fatty infiltration and/or hepatocellular disease.  Electronically Signed   By: Marcello Moores  Register   On: 08/02/2017 09:55  Complexity Note: I personally reviewed the fluoroscopic imaging of the procedure.                        Meds   Current Outpatient Medications:  .  ARIPiprazole (ABILIFY) 5 MG tablet, TAKE 1 TABLET BY MOUTH EVERY DAY, Disp: 90 tablet, Rfl: 1 .  aspirin EC 81 MG tablet, Take 1 tablet (81 mg total) by mouth daily., Disp: , Rfl:  .  Biotin 10000 MCG TABS, Take 10,000 mcg by mouth 1 day or 1 dose., Disp: , Rfl:  .  butalbital-acetaminophen-caffeine (FIORICET, ESGIC) 50-325-40 MG tablet, Take 1-2 tablets by mouth every 6 (six) hours as needed for headache., Disp: 90 tablet, Rfl: 3 .  cholecalciferol (VITAMIN D) 1000 units tablet, Take 1,000 Units by mouth daily., Disp: , Rfl:  .  estradiol (ESTRACE) 1 MG tablet, Take 1 mg by mouth daily., Disp: , Rfl:  .  etodolac (LODINE) 500 MG tablet, Take 1 tablet (500 mg total) by mouth 2 (two) times daily., Disp: 60 tablet, Rfl: 1 .  ezetimibe (ZETIA) 10 MG tablet, Take 1 tablet (10 mg total) by mouth daily., Disp: 90 tablet, Rfl: 3 .  ferrous sulfate 325 (65 FE) MG tablet, Take 325 mg by mouth daily., Disp: , Rfl:  .  fluticasone (FLONASE) 50 MCG/ACT nasal spray, 1 SPRAY  IN EACH NOSTRIL ONCE A DAY NASALLY 30 DAYS as needed, Disp: , Rfl: 3 .  gabapentin (NEURONTIN) 800 MG tablet, Take 1 tablet (800 mg total) by mouth 3 (three) times daily., Disp: 180 tablet, Rfl: 0 .  Magnesium 500 MG CAPS, Take 1 capsule (500 mg total) by mouth 2 (two) times daily at 8 am and 10 pm., Disp: 60 capsule, Rfl: 5 .  Melatonin 10 MG CAPS, Take 1 capsule by mouth at bedtime., Disp: , Rfl:  .  Multiple Vitamin (  MULTIVITAMIN WITH MINERALS) TABS tablet, Take 1 tablet by mouth daily., Disp: , Rfl:  .  neomycin-polymyxin-hydrocortisone (CORTISPORIN) OTIC solution, Place 2 drops into both ears 2 (two) times daily., Disp: 10 mL, Rfl: 0 .  omeprazole (PRILOSEC) 40 MG capsule, Take 40 mg by mouth daily., Disp: , Rfl: 0 .  ondansetron (ZOFRAN ODT) 4 MG disintegrating tablet, Take 1 tablet (4 mg total) by mouth every 8 (eight) hours as needed for nausea or vomiting., Disp: 30 tablet, Rfl: 0 .  rosuvastatin (CRESTOR) 40 MG tablet, Take 1 tablet (40 mg total) by mouth daily., Disp: 90 tablet, Rfl: 3 .  sertraline (ZOLOFT) 100 MG tablet, Take 1 tablet (100 mg total) by mouth 2 (two) times daily., Disp: 180 tablet, Rfl: 1 .  tiZANidine (ZANAFLEX) 4 MG tablet, Take 1 tablet (4 mg total) by mouth 3 (three) times daily., Disp: 90 tablet, Rfl: 1 .  traMADol (ULTRAM) 50 MG tablet, Take 1 tablet (50 mg total) by mouth every 6 (six) hours as needed for severe pain., Disp: 120 tablet, Rfl: 1 .  traZODone (DESYREL) 150 MG tablet, Take 150 mg by mouth at bedtime., Disp: , Rfl: 0 .  Turmeric 500 MG CAPS, Take 2 capsules by mouth daily., Disp: , Rfl:  .  vitamin B-12 (CYANOCOBALAMIN) 500 MCG tablet, Take 500 mcg by mouth daily., Disp: , Rfl:  .  vitamin E 100 UNIT capsule, Take by mouth daily., Disp: , Rfl:  .  LORazepam (ATIVAN) 0.5 MG tablet, Take 1 tablet (0.5 mg total) by mouth 2 (two) times daily as needed for anxiety., Disp: 45 tablet, Rfl: 2  ROS  Constitutional: Denies any fever or  chills Gastrointestinal: No reported hemesis, hematochezia, vomiting, or acute GI distress Musculoskeletal: Denies any acute onset joint swelling, redness, loss of ROM, or weakness Neurological: No reported episodes of acute onset apraxia, aphasia, dysarthria, agnosia, amnesia, paralysis, loss of coordination, or loss of consciousness  Allergies  Ashley Fields is allergic to flagyl [metronidazole] and tape.  PFSH  Drug: Ashley Fields  reports that she does not use drugs. Alcohol:  reports that she does not drink alcohol. Tobacco:  reports that she has quit smoking. She quit after 4.00 years of use. She has never used smokeless tobacco. Medical:  has a past medical history of Allergy, Anemia, Chronic kidney disease, Depression, Disp fx of cuboid bone of right foot, init for clos fx (01/01/2017), GERD (gastroesophageal reflux disease), Headache, Hepatitis C, Hyperlipidemia, Osteoporosis, Thyroid disease, and Urine incontinence. Surgical: Ashley Fields  has a past surgical history that includes Cesarean section (07/27/1981); Abdominal hysterectomy (1987); Breast reduction surgery (Bilateral, 1997); Cholecystectomy (03/2004); Appendectomy (12/2008); Gastric bypass (2005); mini tummy tuck (05/07/2016); breast lift bilateral, implants (05/18/2015); Lumbar laminectomy (06/2006); 5 miscarriages; neck surgery C3-C7 (02/10/2015); IVC FILTER INSERTION (12/2003); Spinal cord stimulator implant (11/2010); Shoulder acromioplasty (Left, 03/27/2013); Reduction mammaplasty (Bilateral, 1997); Augmentation mammaplasty (Bilateral, 2015); and Augmentation mammaplasty (Bilateral, 2018). Family: family history includes COPD in her mother; Colon polyps in her sister; Depression in her sister; Diabetes in her brother and mother; Heart attack in her brother, mother, and sister; Heart attack (age of onset: 58) in her father; Heart disease in her brother, father, and mother; Lung cancer in her mother; Stroke in her  mother.  Constitutional Exam  General appearance: Well nourished, well developed, and well hydrated. In no apparent acute distress Vitals:   08/05/17 1353  BP: 103/70  Pulse: 60  Resp: 18  Temp: 98.3 F (36.8 C)  TempSrc: Oral  SpO2: 100%  Weight: 169 lb (76.7 kg)  Height: 5' 2"  (1.575 m)   BMI Assessment: Estimated body mass index is 30.91 kg/m as calculated from the following:   Height as of this encounter: 5' 2"  (1.575 m).   Weight as of this encounter: 169 lb (76.7 kg).  BMI interpretation table: BMI level Category Range association with higher incidence of chronic pain  <18 kg/m2 Underweight   18.5-24.9 kg/m2 Ideal body weight   25-29.9 kg/m2 Overweight Increased incidence by 20%  30-34.9 kg/m2 Obese (Class I) Increased incidence by 68%  35-39.9 kg/m2 Severe obesity (Class II) Increased incidence by 136%  >40 kg/m2 Extreme obesity (Class III) Increased incidence by 254%   Patient's current BMI Ideal Body weight  Body mass index is 30.91 kg/m. Ideal body weight: 50.1 kg (110 lb 7.2 oz) Adjusted ideal body weight: 60.7 kg (133 lb 13.9 oz)   BMI Readings from Last 4 Encounters:  08/08/17 32.37 kg/m  08/05/17 30.91 kg/m  07/23/17 30.91 kg/m  07/15/17 31.83 kg/m   Wt Readings from Last 4 Encounters:  08/08/17 177 lb (80.3 kg)  08/05/17 169 lb (76.7 kg)  07/23/17 169 lb (76.7 kg)  07/15/17 174 lb (78.9 kg)  Psych/Mental status: Alert, oriented x 3 (person, place, & time)       Eyes: PERLA Respiratory: No evidence of acute respiratory distress  Cervical Spine Area Exam  Skin & Axial Inspection: No masses, redness, edema, swelling, or associated skin lesions Alignment: Symmetrical Functional ROM: Unrestricted ROM      Stability: No instability detected Muscle Tone/Strength: Functionally intact. No obvious neuro-muscular anomalies detected. Sensory (Neurological): Unimpaired Palpation: No palpable anomalies              Upper Extremity (UE) Exam    Side:  Right upper extremity  Side: Left upper extremity  Skin & Extremity Inspection: Skin color, temperature, and hair growth are WNL. No peripheral edema or cyanosis. No masses, redness, swelling, asymmetry, or associated skin lesions. No contractures.  Skin & Extremity Inspection: Skin color, temperature, and hair growth are WNL. No peripheral edema or cyanosis. No masses, redness, swelling, asymmetry, or associated skin lesions. No contractures.  Functional ROM: Unrestricted ROM          Functional ROM: Unrestricted ROM          Muscle Tone/Strength: Functionally intact. No obvious neuro-muscular anomalies detected.  Muscle Tone/Strength: Functionally intact. No obvious neuro-muscular anomalies detected.  Sensory (Neurological): Unimpaired          Sensory (Neurological): Unimpaired          Palpation: No palpable anomalies              Palpation: No palpable anomalies              Provocative Test(s):  Phalen's test: deferred Tinel's test: deferred Apley's scratch test (touch opposite shoulder):  Action 1 (Across chest): deferred Action 2 (Overhead): deferred Action 3 (LB reach): deferred   Provocative Test(s):  Phalen's test: deferred Tinel's test: deferred Apley's scratch test (touch opposite shoulder):  Action 1 (Across chest): deferred Action 2 (Overhead): deferred Action 3 (LB reach): deferred    Thoracic Spine Area Exam  Skin & Axial Inspection: No masses, redness, or swelling Alignment: Symmetrical Functional ROM: Unrestricted ROM Stability: No instability detected Muscle Tone/Strength: Functionally intact. No obvious neuro-muscular anomalies detected. Sensory (Neurological): Unimpaired Muscle strength & Tone: No palpable anomalies  Lumbar Spine Area Exam  Skin & Axial Inspection: No masses, redness, or  swelling Alignment: Symmetrical Functional ROM: Improved after treatment       Stability: No instability detected Muscle Tone/Strength: Functionally intact. No obvious  neuro-muscular anomalies detected. Sensory (Neurological): Unimpaired Palpation: No palpable anomalies       Provocative Tests: Lumbar Hyperextension/rotation test: Improved after treatment       Lumbar quadrant test (Kemp's test): Improved after treatment       Lumbar Lateral bending test: deferred today       Patrick's Maneuver: deferred today                   FABER test: deferred today                   Thigh-thrust test: deferred today       S-I compression test: deferred today       S-I distraction test: deferred today        Gait & Posture Assessment  Ambulation: Unassisted Gait: Relatively normal for age and body habitus Posture: WNL   Lower Extremity Exam    Side: Right lower extremity  Side: Left lower extremity  Stability: No instability observed          Stability: No instability observed          Skin & Extremity Inspection: Skin color, temperature, and hair growth are WNL. No peripheral edema or cyanosis. No masses, redness, swelling, asymmetry, or associated skin lesions. No contractures.  Skin & Extremity Inspection: Skin color, temperature, and hair growth are WNL. No peripheral edema or cyanosis. No masses, redness, swelling, asymmetry, or associated skin lesions. No contractures.  Functional ROM: Unrestricted ROM                  Functional ROM: Unrestricted ROM                  Muscle Tone/Strength: Functionally intact. No obvious neuro-muscular anomalies detected.  Muscle Tone/Strength: Functionally intact. No obvious neuro-muscular anomalies detected.  Sensory (Neurological): Unimpaired  Sensory (Neurological): Unimpaired  Palpation: No palpable anomalies  Palpation: No palpable anomalies   Assessment  Primary Diagnosis & Pertinent Problem List: The primary encounter diagnosis was Chronic Low back pain (Primary Area of Pain) (Bilateral)  (R>L). Diagnoses of Chronic hip pain (Secondary Area of Pain) (Bilateral) (L>R), Chronic lower extremity pain (Tertiary Area of  Pain) (Bilateral) (L>R), Coccygodynia, Failed back surgical syndrome, Chronic pain syndrome, Cervico-occipital neuralgia of left side, Cervicogenic headache (L), Cervical facet syndrome (L), Spondylosis without myelopathy or radiculopathy, cervical region, and Cervicalgia (L) were also pertinent to this visit.  Status Diagnosis  Improved Improved Improved 1. Chronic Low back pain (Primary Area of Pain) (Bilateral)  (R>L)   2. Chronic hip pain (Secondary Area of Pain) (Bilateral) (L>R)   3. Chronic lower extremity pain (Tertiary Area of Pain) (Bilateral) (L>R)   4. Coccygodynia   5. Failed back surgical syndrome   6. Chronic pain syndrome   7. Cervico-occipital neuralgia of left side   8. Cervicogenic headache (L)   9. Cervical facet syndrome (L)   10. Spondylosis without myelopathy or radiculopathy, cervical region   11. Cervicalgia (L)     Problems updated and reviewed during this visit: No problems updated. Plan of Care  Pharmacotherapy (Medications Ordered): No orders of the defined types were placed in this encounter.  Medications administered today: Ashley Fields had no medications administered during this visit.   Procedure Orders     CERVICAL FACET (MEDIAL BRANCH NERVE BLOCK)  Lab Orders  No laboratory test(s) ordered today   Imaging Orders  No imaging studies ordered today   Referral Orders  No referral(s) requested today    Interventional management options: Planned, scheduled, and/or pending:   None at this point.    Considering:   Diagnosticbilateral L3-4 transforaminal LESI Diagnostic bilateral lumbar facet block Possiblebilateral lumbar facetRFA Diagnostic bilateral sacroiliac joint block Possible bilateral sacroiliac joint RFA Diagnosticleft hip injection Diagnosticleft femoral nerve + obturator nerve block Possible left femoral nerve + obturator nerve RFA Diagnostic left knee intra-articular steroid injection Possible series of 5 left  intra-articular Hyalgan knee injection Diagnosticleft genicular nerve block Possible left genicular nerve RFA Diagnosticcervical epidural steroid injection Diagnosticbilateral cervical facet block Possiblebilateral cervical RFA   Palliative PRN treatment(s):   Palliative Racz procedure #2 (last one done on 07/24/2017)    Provider-requested follow-up: Return for Procedure (w/ sedation): (L) C-FCT BLK #1.  Future Appointments  Date Time Provider [place]  08/27/2017  8:15 AM Milinda Pointer, MD [facility] None  09/12/2017  8:45 AM Vevelyn Francois, NP [facility] None  11/13/2017  8:00 AM [place] Ranger Devonne Doughty, DO [facility] None   Primary Care Physician: Olin Hauser, DO Location: [facility] Note by: Gaspar Cola, MD Date: 08/05/2017; Time: 2:22 PM

## 2017-08-05 ENCOUNTER — Other Ambulatory Visit: Payer: Self-pay

## 2017-08-05 ENCOUNTER — Encounter: Payer: Self-pay | Admitting: Pain Medicine

## 2017-08-05 ENCOUNTER — Ambulatory Visit: Payer: Medicare HMO | Attending: Pain Medicine | Admitting: Pain Medicine

## 2017-08-05 VITALS — BP 103/70 | HR 60 | Temp 98.3°F | Resp 18 | Ht 62.0 in | Wt 169.0 lb

## 2017-08-05 DIAGNOSIS — M533 Sacrococcygeal disorders, not elsewhere classified: Secondary | ICD-10-CM | POA: Diagnosis not present

## 2017-08-05 DIAGNOSIS — Z8249 Family history of ischemic heart disease and other diseases of the circulatory system: Secondary | ICD-10-CM | POA: Insufficient documentation

## 2017-08-05 DIAGNOSIS — Z981 Arthrodesis status: Secondary | ICD-10-CM | POA: Insufficient documentation

## 2017-08-05 DIAGNOSIS — Z9049 Acquired absence of other specified parts of digestive tract: Secondary | ICD-10-CM | POA: Insufficient documentation

## 2017-08-05 DIAGNOSIS — M47812 Spondylosis without myelopathy or radiculopathy, cervical region: Secondary | ICD-10-CM | POA: Insufficient documentation

## 2017-08-05 DIAGNOSIS — M48061 Spinal stenosis, lumbar region without neurogenic claudication: Secondary | ICD-10-CM | POA: Insufficient documentation

## 2017-08-05 DIAGNOSIS — R32 Unspecified urinary incontinence: Secondary | ICD-10-CM | POA: Insufficient documentation

## 2017-08-05 DIAGNOSIS — M961 Postlaminectomy syndrome, not elsewhere classified: Secondary | ICD-10-CM | POA: Diagnosis not present

## 2017-08-05 DIAGNOSIS — F329 Major depressive disorder, single episode, unspecified: Secondary | ICD-10-CM | POA: Insufficient documentation

## 2017-08-05 DIAGNOSIS — E782 Mixed hyperlipidemia: Secondary | ICD-10-CM | POA: Insufficient documentation

## 2017-08-05 DIAGNOSIS — M5441 Lumbago with sciatica, right side: Secondary | ICD-10-CM

## 2017-08-05 DIAGNOSIS — M79605 Pain in left leg: Secondary | ICD-10-CM | POA: Diagnosis not present

## 2017-08-05 DIAGNOSIS — Z1211 Encounter for screening for malignant neoplasm of colon: Secondary | ICD-10-CM | POA: Diagnosis not present

## 2017-08-05 DIAGNOSIS — G894 Chronic pain syndrome: Secondary | ICD-10-CM | POA: Insufficient documentation

## 2017-08-05 DIAGNOSIS — Z8619 Personal history of other infectious and parasitic diseases: Secondary | ICD-10-CM | POA: Insufficient documentation

## 2017-08-05 DIAGNOSIS — Z7982 Long term (current) use of aspirin: Secondary | ICD-10-CM | POA: Insufficient documentation

## 2017-08-05 DIAGNOSIS — K760 Fatty (change of) liver, not elsewhere classified: Secondary | ICD-10-CM | POA: Insufficient documentation

## 2017-08-05 DIAGNOSIS — Z825 Family history of asthma and other chronic lower respiratory diseases: Secondary | ICD-10-CM | POA: Insufficient documentation

## 2017-08-05 DIAGNOSIS — E079 Disorder of thyroid, unspecified: Secondary | ICD-10-CM | POA: Insufficient documentation

## 2017-08-05 DIAGNOSIS — Z1212 Encounter for screening for malignant neoplasm of rectum: Secondary | ICD-10-CM | POA: Diagnosis not present

## 2017-08-05 DIAGNOSIS — N189 Chronic kidney disease, unspecified: Secondary | ICD-10-CM | POA: Insufficient documentation

## 2017-08-05 DIAGNOSIS — K219 Gastro-esophageal reflux disease without esophagitis: Secondary | ICD-10-CM | POA: Insufficient documentation

## 2017-08-05 DIAGNOSIS — Z90710 Acquired absence of both cervix and uterus: Secondary | ICD-10-CM | POA: Insufficient documentation

## 2017-08-05 DIAGNOSIS — M79604 Pain in right leg: Secondary | ICD-10-CM

## 2017-08-05 DIAGNOSIS — M5442 Lumbago with sciatica, left side: Secondary | ICD-10-CM

## 2017-08-05 DIAGNOSIS — R51 Headache: Secondary | ICD-10-CM | POA: Diagnosis not present

## 2017-08-05 DIAGNOSIS — Z823 Family history of stroke: Secondary | ICD-10-CM | POA: Insufficient documentation

## 2017-08-05 DIAGNOSIS — Z87891 Personal history of nicotine dependence: Secondary | ICD-10-CM | POA: Insufficient documentation

## 2017-08-05 DIAGNOSIS — D649 Anemia, unspecified: Secondary | ICD-10-CM | POA: Insufficient documentation

## 2017-08-05 DIAGNOSIS — Z9884 Bariatric surgery status: Secondary | ICD-10-CM | POA: Insufficient documentation

## 2017-08-05 DIAGNOSIS — M810 Age-related osteoporosis without current pathological fracture: Secondary | ICD-10-CM | POA: Diagnosis not present

## 2017-08-05 DIAGNOSIS — Z8371 Family history of colonic polyps: Secondary | ICD-10-CM | POA: Insufficient documentation

## 2017-08-05 DIAGNOSIS — G4486 Cervicogenic headache: Secondary | ICD-10-CM | POA: Insufficient documentation

## 2017-08-05 DIAGNOSIS — Z883 Allergy status to other anti-infective agents status: Secondary | ICD-10-CM | POA: Diagnosis not present

## 2017-08-05 DIAGNOSIS — M25559 Pain in unspecified hip: Secondary | ICD-10-CM

## 2017-08-05 DIAGNOSIS — M5136 Other intervertebral disc degeneration, lumbar region: Secondary | ICD-10-CM | POA: Insufficient documentation

## 2017-08-05 DIAGNOSIS — M4316 Spondylolisthesis, lumbar region: Secondary | ICD-10-CM | POA: Insufficient documentation

## 2017-08-05 DIAGNOSIS — M545 Low back pain: Secondary | ICD-10-CM | POA: Diagnosis present

## 2017-08-05 DIAGNOSIS — M5481 Occipital neuralgia: Secondary | ICD-10-CM

## 2017-08-05 DIAGNOSIS — M25512 Pain in left shoulder: Secondary | ICD-10-CM | POA: Insufficient documentation

## 2017-08-05 DIAGNOSIS — Z79899 Other long term (current) drug therapy: Secondary | ICD-10-CM | POA: Insufficient documentation

## 2017-08-05 DIAGNOSIS — E785 Hyperlipidemia, unspecified: Secondary | ICD-10-CM | POA: Insufficient documentation

## 2017-08-05 DIAGNOSIS — M542 Cervicalgia: Secondary | ICD-10-CM | POA: Insufficient documentation

## 2017-08-05 DIAGNOSIS — F4310 Post-traumatic stress disorder, unspecified: Secondary | ICD-10-CM | POA: Insufficient documentation

## 2017-08-05 DIAGNOSIS — Z833 Family history of diabetes mellitus: Secondary | ICD-10-CM | POA: Insufficient documentation

## 2017-08-05 DIAGNOSIS — Z87442 Personal history of urinary calculi: Secondary | ICD-10-CM | POA: Insufficient documentation

## 2017-08-05 DIAGNOSIS — Z801 Family history of malignant neoplasm of trachea, bronchus and lung: Secondary | ICD-10-CM | POA: Insufficient documentation

## 2017-08-05 DIAGNOSIS — G8929 Other chronic pain: Secondary | ICD-10-CM

## 2017-08-05 LAB — COLOGUARD: COLOGUARD: NEGATIVE

## 2017-08-05 NOTE — Progress Notes (Signed)
Safety precautions to be maintained throughout the outpatient stay will include: orient to surroundings, keep bed in low position, maintain call bell within reach at all times, provide assistance with transfer out of bed and ambulation.  

## 2017-08-05 NOTE — Patient Instructions (Addendum)
____________________________________________________________________________________________  Preparing for Procedure with Sedation  Instructions: . Oral Intake: Do not eat or drink anything for at least 8 hours prior to your procedure. . Transportation: Public transportation is not allowed. Bring an adult driver. The driver must be physically present in our waiting room before any procedure can be started. . Physical Assistance: Bring an adult physically capable of assisting you, in the event you need help. This adult should keep you company at home for at least 6 hours after the procedure. . Blood Pressure Medicine: Take your blood pressure medicine with a sip of water the morning of the procedure. . Blood thinners: Notify our staff if you are taking any blood thinners. Depending on which one you take, there will be specific instructions on how and when to stop it. . Diabetics on insulin: Notify the staff so that you can be scheduled 1st case in the morning. If your diabetes requires high dose insulin, take only  of your normal insulin dose the morning of the procedure and notify the staff that you have done so. . Preventing infections: Shower with an antibacterial soap the morning of your procedure. . Build-up your immune system: Take 1000 mg of Vitamin C with every meal (3 times a day) the day prior to your procedure. . Antibiotics: Inform the staff if you have a condition or reason that requires you to take antibiotics before dental procedures. . Pregnancy: If you are pregnant, call and cancel the procedure. . Sickness: If you have a cold, fever, or any active infections, call and cancel the procedure. . Arrival: You must be in the facility at least 30 minutes prior to your scheduled procedure. . Children: Do not bring children with you. . Dress appropriately: Bring dark clothing that you would not mind if they get stained. . Valuables: Do not bring any jewelry or valuables.  Procedure  appointments are reserved for interventional treatments only. . No Prescription Refills. . No medication changes will be discussed during procedure appointments. . No disability issues will be discussed.  Reasons to call and reschedule or cancel your procedure: (Following these recommendations will minimize the risk of a serious complication.) . Surgeries: Avoid having procedures within 2 weeks of any surgery. (Avoid for 2 weeks before or after any surgery). . Flu Shots: Avoid having procedures within 2 weeks of a flu shots or . (Avoid for 2 weeks before or after immunizations). . Barium: Avoid having a procedure within 7-10 days after having had a radiological study involving the use of radiological contrast. (Myelograms, Barium swallow or enema study). . Heart attacks: Avoid any elective procedures or surgeries for the initial 6 months after a "Myocardial Infarction" (Heart Attack). . Blood thinners: It is imperative that you stop these medications before procedures. Let us know if you if you take any blood thinner.  . Infection: Avoid procedures during or within two weeks of an infection (including chest colds or gastrointestinal problems). Symptoms associated with infections include: Localized redness, fever, chills, night sweats or profuse sweating, burning sensation when voiding, cough, congestion, stuffiness, runny nose, sore throat, diarrhea, nausea, vomiting, cold or Flu symptoms, recent or current infections. It is specially important if the infection is over the area that we intend to treat. . Heart and lung problems: Symptoms that may suggest an active cardiopulmonary problem include: cough, chest pain, breathing difficulties or shortness of breath, dizziness, ankle swelling, uncontrolled high or unusually low blood pressure, and/or palpitations. If you are experiencing any of these symptoms, cancel   your procedure and contact your primary care physician for an evaluation.  Remember:   Regular Business hours are:  Monday to Thursday 8:00 AM to 4:00 PM  Provider's Schedule: Owen Pagnotta, MD:  Procedure days: Tuesday and Thursday 7:30 AM to 4:00 PM  Bilal Lateef, MD:  Procedure days: Monday and Wednesday 7:30 AM to 4:00 PM ____________________________________________________________________________________________    

## 2017-08-06 ENCOUNTER — Encounter: Payer: Self-pay | Admitting: Cardiovascular Disease

## 2017-08-08 ENCOUNTER — Ambulatory Visit (INDEPENDENT_AMBULATORY_CARE_PROVIDER_SITE_OTHER): Payer: Medicare HMO | Admitting: Family Medicine

## 2017-08-08 ENCOUNTER — Encounter: Payer: Self-pay | Admitting: Family Medicine

## 2017-08-08 VITALS — BP 95/57 | HR 57 | Temp 98.3°F | Resp 16 | Ht 62.0 in | Wt 177.0 lb

## 2017-08-08 DIAGNOSIS — E875 Hyperkalemia: Secondary | ICD-10-CM

## 2017-08-08 DIAGNOSIS — Z0000 Encounter for general adult medical examination without abnormal findings: Secondary | ICD-10-CM

## 2017-08-08 DIAGNOSIS — Z8619 Personal history of other infectious and parasitic diseases: Secondary | ICD-10-CM

## 2017-08-08 DIAGNOSIS — E782 Mixed hyperlipidemia: Secondary | ICD-10-CM

## 2017-08-08 DIAGNOSIS — K760 Fatty (change of) liver, not elsewhere classified: Secondary | ICD-10-CM

## 2017-08-08 DIAGNOSIS — F4310 Post-traumatic stress disorder, unspecified: Secondary | ICD-10-CM | POA: Diagnosis not present

## 2017-08-08 DIAGNOSIS — M79672 Pain in left foot: Secondary | ICD-10-CM

## 2017-08-08 DIAGNOSIS — M5136 Other intervertebral disc degeneration, lumbar region: Secondary | ICD-10-CM

## 2017-08-08 DIAGNOSIS — Z79899 Other long term (current) drug therapy: Secondary | ICD-10-CM

## 2017-08-08 DIAGNOSIS — R69 Illness, unspecified: Secondary | ICD-10-CM | POA: Diagnosis not present

## 2017-08-08 DIAGNOSIS — Z9884 Bariatric surgery status: Secondary | ICD-10-CM

## 2017-08-08 DIAGNOSIS — G894 Chronic pain syndrome: Secondary | ICD-10-CM

## 2017-08-08 MED ORDER — LORAZEPAM 0.5 MG PO TABS: 0.5000 mg | ORAL_TABLET | Freq: Two times a day (BID) | ORAL | 2 refills | Status: DC | PRN

## 2017-08-08 NOTE — Assessment & Plan Note (Signed)
Chronic problem History of HLD. Confirmed on Abd [place] On Statin Improved cholesterol Follow-up yearly

## 2017-08-08 NOTE — Progress Notes (Signed)
Subjective:    Patient ID: Ashley Fields, female    DOB: 09-24-1955, 62 y.o.   MRN: [id number]  Ashley Fields is a 62 y.o. female presenting on 08/08/2017 for Annual Exam   HPI   Here for Annual Physical and Lab Review.  Hyperkalemia Recent lab with K 5.8, she has stopped eating bananas daily Due for re-check lab  FOLLOW-UP Adjustment Disorder w/ Depressed mood / PTSD / Anxiety Last visit 07/2017 for same problem, after recent passing of her sister, see note for background HPI, she was started back on Abilify 5mg  daily, and continued on Sertraline, Trazodone. - Today reports her mood is improved. She is still handling recent loss better. See PHQ below - Not established with psychiatry - Due for Lorazepam refill - she has changed pharmacy - Regarding sleep, still has insomnia but now doing better on Trazodone and Melatonin Reduced crying spells and mood lability  Otitis Externa vs Psoriasis of Ears - resolving  Chronic Pain Syndrome / Migraines Followed by [facility] Continues on Fioricet for migraines PRN Scheduled for Cervical spine - facet inj > Pain management due to headaches - Left foot injury has improved by her report. Less pain, improved ambulation. No secondary infection  S/p Gastric Bypass / Reduced absorption iron / vitamins / Iron Deficiency  Recent labs reviewed show normal anemia panel and Tsat, ferritin, iron levels. Normal hemoglobin. Also normal Vitamin D and B12. Continues on iron supplement among other supplements see med list  History of Hep C s/p treatment / Hepatic Steatosis She was previously followed by GI, last seen 01/2017 and advised not needed to return, she would need abdomen US liver surveillance q 6-12 months. - Last done Abd RUQ [place] 07/2017 - reviewed results - showed increased echogenicity c/w fatty liver, prior history of this as well. No confirmed history of cirrhosis. No evidence of mass or lesion - She does not plan to return to GI at  this time  History of hollenhorst plaque / HLD Previously cholesterol elevated - now improved on statin therapy, followed by Cardiology Last lipid panel 07/2017 - showed normal range lipids. Well controlled Currently taking - Rosuvastatin 40mg  daily - tolerating well without myalgias Taking ASA 81  Health Maintenance:  Colon CA Screening: Last Colonoscopy do not have record (done by [facility]). Currently due for repeat and attempted to do Cologuard but sample was not appropriate due to timing. She will recollect. Awaiting result. Fam history of polyps not cancer.  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  Depression screen [facility] 2/9 08/08/2017 08/05/2017 07/23/2017  Decreased Interest 1 0 0  Down, Depressed, Hopeless 0 0 0  PHQ - 2 Score 1 0 0  Altered sleeping 1 - -  Tired, decreased energy 2 - -  Change in appetite 2 - -  Feeling bad or failure about yourself  0 - -  Trouble concentrating 1 - -  Moving slowly or fidgety/restless 1 - -  Suicidal thoughts 0 - -  PHQ-9 Score 8 - -  Difficult doing work/chores Somewhat difficult - -  Some recent data might be hidden   GAD 7 : Generalized Anxiety Score 08/08/2017 07/05/2017 06/10/2017  Nervous, Anxious, on Edge 0 3 3  Control/stop worrying 0 1 2  Worry too much - different things 0 1 2  Trouble relaxing 1 1 2  Restless 1 1 1   Easily annoyed or irritable 1 2 2   Afraid - awful might happen 0 0 0  Total GAD 7 Score 3 9 12   Anxiety Difficulty Not difficult at all Somewhat difficult Somewhat difficult    Past Medical History:  Diagnosis Date  . Allergy   . Anemia   . Chronic kidney disease   . Depression   . Disp fx of cuboid bone of right foot, init for clos fx 01/01/2017  . GERD (gastroesophageal reflux disease)   . Headache   . Hepatitis C   . Hyperlipidemia   . Osteoporosis   . Thyroid  disease   . Urine incontinence    Past Surgical History:  Procedure Laterality Date  . 5 miscarriages     1977-1985, Blood transfusion s/p miscarriage 1977  . ABDOMINAL HYSTERECTOMY  1987   Total  . APPENDECTOMY  12/2008  . AUGMENTATION MAMMAPLASTY Bilateral 2015   Bilat  . AUGMENTATION MAMMAPLASTY Bilateral 2018   implants redone w/ placement of implants under muscle  . breast lift bilateral, implants  66/06/3014   bilateral, silicon naturel  . BREAST REDUCTION SURGERY Bilateral 1997  . CESAREAN SECTION  07/27/1981   Placenta Previa  . CHOLECYSTECTOMY  03/2004   Lap surgery  . GASTRIC BYPASS  2005   Laparoscopic  . IVC FILTER INSERTION  12/2003   TrapEase Vena Cava Filter  . LUMBAR LAMINECTOMY  06/2006   L4-L5 (spinal fusion)  . mini tummy tuck  05/07/2016   Bilateral bra/back roll lift skin removal  . neck surgery C3-C7  02/10/2015   ACDF  . REDUCTION MAMMAPLASTY Bilateral 1997  . SHOULDER ACROMIOPLASTY Left 03/27/2013   w/ labral debridement  . SPINAL CORD STIMULATOR IMPLANT  11/2010   removed 05/2011   Social History   Socioeconomic History  . Marital status: Widowed    Spouse name: Not on file  . Number of children: Not on file  . Years of education: [employer]  . Highest education level: Not on file  Occupational History  . Not on file  Social Needs  . Financial resource strain: Not on file  . Food insecurity:    Worry: Not on file    Inability: Not on file  . Transportation needs:    Medical: Not on file    Non-medical: Not on file  Tobacco Use  . Smoking status: Former Smoker    Years: 4.00  . Smokeless tobacco: Never Used  Substance and Sexual Activity  . Alcohol use: No  . Drug use: No  . Sexual activity: Not on file  Lifestyle  . Physical activity:    Days per week: Not on file    Minutes per session: Not on file  . Stress: Not on file  Relationships  . Social connections:    Talks on phone: Not on file    Gets together: Not on file     Attends religious service: Not on file    Active member of club or organization: Not on file    Attends meetings of clubs or organizations: Not on file    Relationship status: Not on file  . Intimate partner violence:    Fear of current or ex partner: Not on file    Emotionally abused: Not on file    Physically abused: Not on file    Forced sexual activity: Not on file  Other Topics Concern  . Not on file  Social History Narrative  . Not on  file   Family History  Problem Relation Age of Onset  . COPD Mother   . Lung cancer Mother   . Diabetes Mother   . Heart disease Mother   . Stroke Mother   . Heart attack Mother   . Heart disease Father   . Heart attack Father 72  . Depression Sister        x 5 sisters  . Heart disease Brother        x 3 brothers  . Diabetes Brother   . Colon polyps Sister   . Heart attack Sister   . Heart attack Brother   . Breast cancer Neg Hx    Current Outpatient Medications on File Prior to Visit  Medication Sig  . ARIPiprazole (ABILIFY) 5 MG tablet TAKE 1 TABLET BY MOUTH EVERY DAY  . aspirin EC 81 MG tablet Take 1 tablet (81 mg total) by mouth daily.  . Biotin 10000 MCG TABS Take 10,000 mcg by mouth 1 day or 1 dose.  . butalbital-acetaminophen-caffeine (FIORICET, ESGIC) 50-325-40 MG tablet Take 1-2 tablets by mouth every 6 (six) hours as needed for headache.  . cholecalciferol (VITAMIN D) 1000 units tablet Take 1,000 Units by mouth daily.  Marland Kitchen estradiol (ESTRACE) 1 MG tablet Take 1 mg by mouth daily.  Marland Kitchen etodolac (LODINE) 500 MG tablet Take 1 tablet (500 mg total) by mouth 2 (two) times daily.  Marland Kitchen ezetimibe (ZETIA) 10 MG tablet Take 1 tablet (10 mg total) by mouth daily.  . ferrous sulfate 325 (65 FE) MG tablet Take 325 mg by mouth daily.  . fluticasone (FLONASE) 50 MCG/ACT nasal spray 1 SPRAY IN EACH NOSTRIL ONCE A DAY NASALLY 30 DAYS as needed  . gabapentin (NEURONTIN) 800 MG tablet Take 1 tablet (800 mg total) by mouth 3 (three) times daily.  .  Magnesium 500 MG CAPS Take 1 capsule (500 mg total) by mouth 2 (two) times daily at 8 am and 10 pm.  . Melatonin 10 MG CAPS Take 1 capsule by mouth at bedtime.  . Multiple Vitamin (MULTIVITAMIN WITH MINERALS) TABS tablet Take 1 tablet by mouth daily.  Marland Kitchen neomycin-polymyxin-hydrocortisone (CORTISPORIN) OTIC solution Place 2 drops into both ears 2 (two) times daily.  Marland Kitchen omeprazole (PRILOSEC) 40 MG capsule Take 40 mg by mouth daily.  . ondansetron (ZOFRAN ODT) 4 MG disintegrating tablet Take 1 tablet (4 mg total) by mouth every 8 (eight) hours as needed for nausea or vomiting.  . rosuvastatin (CRESTOR) 40 MG tablet Take 1 tablet (40 mg total) by mouth daily.  . sertraline (ZOLOFT) 100 MG tablet Take 1 tablet (100 mg total) by mouth 2 (two) times daily.  Marland Kitchen tiZANidine (ZANAFLEX) 4 MG tablet Take 1 tablet (4 mg total) by mouth 3 (three) times daily.  . traMADol (ULTRAM) 50 MG tablet Take 1 tablet (50 mg total) by mouth every 6 (six) hours as needed for severe pain.  . traZODone (DESYREL) 150 MG tablet Take 150 mg by mouth at bedtime.  . Turmeric 500 MG CAPS Take 2 capsules by mouth daily.  . vitamin B-12 (CYANOCOBALAMIN) 500 MCG tablet Take 500 mcg by mouth daily.  . vitamin E 100 UNIT capsule Take by mouth daily.   No current facility-administered medications on file prior to visit.     Review of Systems  Constitutional: Negative for activity change, appetite change, chills, diaphoresis, fatigue and fever.  HENT: Negative for congestion and hearing loss.   Eyes: Negative for visual disturbance.  Respiratory: Negative for  apnea, cough, choking, chest tightness, shortness of breath and wheezing.   Cardiovascular: Negative for chest pain, palpitations and leg swelling.  Gastrointestinal: Negative for abdominal pain, anal bleeding, blood in stool, constipation, diarrhea, nausea and vomiting.  Endocrine: Negative for cold intolerance.  Genitourinary: Negative for difficulty urinating, dysuria,  frequency and hematuria.  Musculoskeletal: Positive for arthralgias and back pain. Negative for neck pain.  Skin: Negative for rash.  Allergic/Immunologic: Negative for environmental allergies.  Neurological: Negative for dizziness, weakness, light-headedness, numbness and headaches.  Hematological: Negative for adenopathy.  Psychiatric/Behavioral: Positive for dysphoric mood. Negative for behavioral problems and sleep disturbance. The patient is not nervous/anxious.    Per HPI unless specifically indicated above     Objective:    BP (!) 95/57   Pulse (!) 57   Temp 98.3 F (36.8 C) (Oral)   Resp 16   Ht 5\' 2"  (1.575 m)   Wt 177 lb (80.3 kg)   BMI 32.37 kg/m   Wt Readings from Last 3 Encounters:  08/08/17 177 lb (80.3 kg)  08/05/17 169 lb (76.7 kg)  07/23/17 169 lb (76.7 kg)    Physical Exam  Constitutional: She is oriented to person, place, and time. She appears well-developed and well-nourished. No distress.  Chronically ill appearing 62 yr old female, mostly comfortable today, cooperative, uses cane  HENT:  Head: Normocephalic and atraumatic.  Mouth/Throat: Oropharynx is clear and moist.  Eyes: Conjunctivae are normal. Right eye exhibits no discharge. Left eye exhibits no discharge.  Neck: Normal range of motion. Neck supple. No thyromegaly present.  Cardiovascular: Normal rate, regular rhythm, normal heart sounds and intact distal pulses.  No murmur heard. Pulmonary/Chest: Effort normal and breath sounds normal. No respiratory distress. She has no wheezes. She has no rales.  Musculoskeletal: Normal range of motion. She exhibits no edema.  Low Back Inspection: Normal appearance, no spinal deformity, symmetrical. Palpation: Mild general tenderness over spinous processes and bilateral paraspinal muscles low lumbar, with assoc hypertonicity/spasm. ROM: Reduced active ROM forward flex / back extension, rotation L/R without discomfort Strength: Bilateral hip flex/ext 5/5, knee  flex/ext 5/5, ankle dorsiflex/plantarflex 5/5 Neurovascular: reduced distal sensation to light touch  Uses a cane to assist with ambulation  Lymphadenopathy:    She has no cervical adenopathy.  Neurological: She is alert and oriented to person, place, and time.  Skin: Skin is warm and dry. No rash noted. She is not diaphoretic. No erythema.  Left foot plantar aspect with healing puncture wounds, with some peeling skin now that edema is improved, no residual erythema or ecchymosis  Psychiatric: She has a normal mood and affect. Her behavior is normal.  Well groomed, good eye contact, normal speech and thoughts. Mood remains improved. Good insight into mental health.  Nursing note and vitals reviewed.  I have personally reviewed the radiology report from 08/02/17 RUQ Abd [place].  CLINICAL DATA:  Hepatitis-C.  EXAM: ULTRASOUND ABDOMEN LIMITED RIGHT UPPER QUADRANT  COMPARISON:  No recent prior.  FINDINGS: Gallbladder:  Cholecystectomy.  Common bile duct:  Diameter: 3.0 mm  Liver:  Increased echogenicity consistent fatty infiltration and/or hepatocellular disease. No focal hepatic abnormality identified. Portal vein is patent on color Doppler imaging with normal direction of blood flow towards the liver.  IMPRESSION: 1.  Cholecystectomy.  No biliary distention.  2. Increased hepatic echogenicity consistent fatty infiltration and/or hepatocellular disease.   Electronically Signed   By: Marcello Moores  Register   On: 08/02/2017 09:55  Results for orders placed or performed in visit on  07/30/17  Iron, TIBC and Ferritin Panel  Result Value Ref Range   Iron 65 45 - 160 mcg/dL   TIBC 407 250 - 450 mcg/dL (calc)   %SAT 16 16 - 45 % (calc)   Ferritin 25 16 - 288 ng/mL  Vitamin B12  Result Value Ref Range   Vitamin B-12 989 200 - 1,100 pg/mL  VITAMIN D 25 Hydroxy (Vit-D Deficiency, Fractures)  Result Value Ref Range   Vit D, 25-Hydroxy 37 30 - 100 ng/mL  T4, free    Result Value Ref Range   Free T4 1.0 0.8 - 1.8 ng/dL  TSH  Result Value Ref Range   TSH 1.88 0.40 - 4.50 mIU/L  Lipid panel  Result Value Ref Range   Cholesterol 142 <200 mg/dL   HDL 79 >50 mg/dL   Triglycerides 68 <150 mg/dL   LDL Cholesterol (Calc) 49 mg/dL (calc)   Total CHOL/HDL Ratio 1.8 <5.0 (calc)   Non-HDL Cholesterol (Calc) 63 <130 mg/dL (calc)  COMPLETE METABOLIC PANEL WITH GFR  Result Value Ref Range   Glucose, Bld 98 65 - 99 mg/dL   BUN 23 7 - 25 mg/dL   Creat 0.98 0.50 - 0.99 mg/dL   GFR, Est Non African American 62 > OR = 60 mL/min/1.32m2   GFR, Est African American 72 > OR = 60 mL/min/1.62m2   BUN/Creatinine Ratio NOT APPLICABLE 6 - 22 (calc)   Sodium 143 135 - 146 mmol/L   Potassium 5.8 (H) 3.5 - 5.3 mmol/L   Chloride 103 98 - 110 mmol/L   CO2 29 20 - 32 mmol/L   Calcium 9.3 8.6 - 10.4 mg/dL   Total Protein 6.9 6.1 - 8.1 g/dL   Albumin 4.4 3.6 - 5.1 g/dL   Globulin 2.5 1.9 - 3.7 g/dL (calc)   AG Ratio 1.8 1.0 - 2.5 (calc)   Total Bilirubin 0.6 0.2 - 1.2 mg/dL   Alkaline phosphatase (APISO) 57 33 - 130 U/L   AST 23 10 - 35 U/L   ALT 16 6 - 29 U/L  CBC with Differential/Platelet  Result Value Ref Range   WBC 5.0 3.8 - 10.8 Thousand/uL   RBC 4.33 3.80 - 5.10 Million/uL   Hemoglobin 12.9 11.7 - 15.5 g/dL   HCT 39.0 35.0 - 45.0 %   MCV 90.1 80.0 - 100.0 fL   MCH 29.8 27.0 - 33.0 pg   MCHC 33.1 32.0 - 36.0 g/dL   RDW 13.6 11.0 - 15.0 %   Platelets 163 140 - 400 Thousand/uL   MPV 10.2 7.5 - 12.5 fL   Neutro Abs 3,425 1,500 - 7,800 cells/uL   Lymphs Abs 1,085 850 - 3,900 cells/uL   WBC mixed population 340 200 - 950 cells/uL   Eosinophils Absolute 120 15 - 500 cells/uL   Basophils Absolute 30 0 - 200 cells/uL   Neutrophils Relative % 68.5 %   Total Lymphocyte 21.7 %   Monocytes Relative 6.8 %   Eosinophils Relative 2.4 %   Basophils Relative 0.6 %  Hemoglobin A1c  Result Value Ref Range   Hgb A1c MFr Bld 4.9 <5.7 % of total Hgb   Mean Plasma  Glucose 94 (calc)   eAG (mmol/L) 5.2 (calc)      Assessment & Plan:   Problem List Items Addressed This Visit    Chronic pain syndrome (Chronic)    Chronic pain secondary to spinal DJD and spinal stenosis, neuropathy Followed by [facility] S/p ESI injections. Now for facet  injections Continue Tramadol, Tizanidine, Gabapentin (refilled) Continues to follow-up with [facility] for further med adjust - may need alternative pain medicine - Future consider switch Zoloft to Cymbalta      DDD (degenerative disc disease), lumbar (Chronic)   Hepatic steatosis    Chronic problem History of HLD. Confirmed on Abd [place] On Statin Improved cholesterol Follow-up yearly      History of hepatitis C    S/p cured Hep C Surveilance with Abd US imaging No confirmed cirrhosis Last [place] image 07/2017 -fatty liver, without abnormal lesion May check [place] yearly - consider return to GI for fibrascan      Long term prescription benzodiazepine use (Chronic)   Mixed hyperlipidemia    Controlled cholesterol on statin and lifestyle Last lipid panel 07/2017 Prior hollenhorst plaque, followed by Cardiology  Plan: 1. Continue current meds - Rosuvastatin 40mg  2. Continue ASA 81mg  for primary ASCVD risk reduction 3. Encourage improved lifestyle - low carb/cholesterol, reduce portion size, continue improving regular exercise      PTSD (post-traumatic stress disorder)    Stable chronic history PTSD w/ anxiety On chronic BDZ, years on Diazepam now transitioned to Lorazepam using more PRN lower doses now Continue Zoloft Agree to refill Lorazepam, reduce dose to 0.5mg  BID, #45 with refills for 3 month supply, ultimately if can reduce dose this is goal - keep re-evaluating  Also Future consider switch zoloft to cymbalta if need      Relevant Medications   LORazepam (ATIVAN) 0.5 MG tablet   S/P gastric bypass    Labs show normal nutrients, iron, vitamins continue current supplements        Other Visit Diagnoses    Annual physical exam    -  Primary Updated Health Maintenance information - Cologuard was re ordered and resubmitted already -pending Reviewed recent lab results with patient Encouraged improvement to lifestyle with diet and exercise - Goal of weight loss      Left foot pain     Prior puncture injury vs sprain as well Improving Ambulate without significant issue now, using cane Continue current management No refer to Ortho at this time    Hyperkalemia     Reduced K rich foods Re-check BMET    Relevant Orders   BASIC METABOLIC PANEL WITH GFR      Meds ordered this encounter  Medications  . LORazepam (ATIVAN) 0.5 MG tablet    Sig: Take 1 tablet (0.5 mg total) by mouth 2 (two) times daily as needed for anxiety.    Dispense:  45 tablet    Refill:  2    Follow up plan: Return in about 3 months (around 11/08/2017) for PTSD Mood Anxiety PHQ GAD refill.  Additionally - given patient a [place] (Genetic Cancer Screening) order form to complete at home with her family history, will bring back to [place] at next visit and draw blood to send out for genetic cancer screening.  Nobie Putnam, [facility] 08/08/2017, 6:44 PM

## 2017-08-08 NOTE — Assessment & Plan Note (Signed)
Chronic pain secondary to spinal DJD and spinal stenosis, neuropathy Followed by [facility] Pain Management S/p ESI injections. Now for facet injections Continue Tramadol, Tizanidine, Gabapentin (refilled) Continues to follow-up with [facility] for further med adjust - may need alternative pain medicine - Future consider switch Zoloft to Cymbalta

## 2017-08-08 NOTE — Assessment & Plan Note (Addendum)
Stable chronic history PTSD w/ anxiety On chronic BDZ, years on Diazepam now transitioned to Lorazepam using more PRN lower doses now Continue Zoloft Agree to refill Lorazepam, reduce dose to 0.5mg  BID, #45 with refills for 3 month supply, ultimately if can reduce dose this is goal - keep re-evaluating  Also Future consider switch zoloft to cymbalta if need

## 2017-08-08 NOTE — Assessment & Plan Note (Signed)
S/p cured Hep C Surveilance with Abd US imaging No confirmed cirrhosis Last [place] image 07/2017 -fatty liver, without abnormal lesion May check [place] yearly - consider return to GI for fibrascan

## 2017-08-08 NOTE — Patient Instructions (Addendum)
Thank you for coming to the office today.  Reviewed lab results - overall very good as we have discussed.  If you want to discuss cholesterol medicine with Dr Rockey Situ you can do this if you think you have side effects. May try to hold medicines for a temporary period to see if you feel better.  Continue current medicine.  I will refill Lorazepam once I authorize it at [facility].  Muscle tremors are mostly normal as you have described. Likely due to some weakness in numb and muscles.  Leg cramps - Try spoonful of yellow mustard to relieve leg cramps or try daily to prevent the problem  - OTC natural option is Hyland's Leg Cramps (Dissolving tablet) take as needed for muscle cramps  Please schedule a Follow-up Appointment to: Return in about 3 months (around 11/08/2017) for PTSD Mood Anxiety PHQ GAD refill.  If you have any other questions or concerns, please feel free to call the office or send a message through [place]. You may also schedule an earlier appointment if necessary.  Additionally, you may be receiving a survey about your experience at our office within a few days to 1 week by e-mail or mail. We value your feedback.  Nobie Putnam, DO [place]

## 2017-08-08 NOTE — Assessment & Plan Note (Signed)
Labs show normal nutrients, iron, vitamins continue current supplements

## 2017-08-08 NOTE — Assessment & Plan Note (Signed)
Controlled cholesterol on statin and lifestyle Last lipid panel 07/2017 Prior hollenhorst plaque, followed by Cardiology  Plan: 1. Continue current meds - Rosuvastatin 40mg  2. Continue ASA 81mg  for primary ASCVD risk reduction 3. Encourage improved lifestyle - low carb/cholesterol, reduce portion size, continue improving regular exercise

## 2017-08-09 ENCOUNTER — Telehealth: Payer: Self-pay | Admitting: Cardiovascular Disease

## 2017-08-09 ENCOUNTER — Encounter: Payer: Self-pay | Admitting: Pain Medicine

## 2017-08-09 LAB — BASIC METABOLIC PANEL WITH GFR
BUN: 10 mg/dL (ref 7–25)
CALCIUM: 9.5 mg/dL (ref 8.6–10.4)
CO2: 30 mmol/L (ref 20–32)
Chloride: 106 mmol/L (ref 98–110)
Creat: 0.88 mg/dL (ref 0.50–0.99)
GFR, Est African American: 82 mL/min/{1.73_m2} (ref >=60)
GFR, Est Non African American: 71 mL/min/{1.73_m2} (ref >=60)
Glucose, Bld: 103 mg/dL — ABNORMAL HIGH (ref 65–99)
Potassium: 6 mmol/L — ABNORMAL HIGH (ref 3.5–5.3)
Sodium: 143 mmol/L (ref 135–146)

## 2017-08-09 MED ORDER — FUROSEMIDE 20 MG PO TABS: 20.0000 mg | ORAL_TABLET | Freq: Every day | ORAL | 0 refills | Status: DC | PRN

## 2017-08-09 NOTE — Telephone Encounter (Signed)
Pt states he potassium that her PCP performed was 6.0 and total cholesterol was 140. Please call to discuss her potassium level

## 2017-08-09 NOTE — Telephone Encounter (Signed)
Left voicemail message to call back  

## 2017-08-09 NOTE — Addendum Note (Signed)
Addended by: Olin Hauser on: 08/09/2017 12:53 PM   Modules accepted: Orders

## 2017-08-12 ENCOUNTER — Other Ambulatory Visit: Payer: Self-pay | Admitting: Nurse Practitioner

## 2017-08-12 DIAGNOSIS — G8929 Other chronic pain: Secondary | ICD-10-CM

## 2017-08-12 DIAGNOSIS — M25562 Pain in left knee: Principal | ICD-10-CM

## 2017-08-12 NOTE — Telephone Encounter (Signed)
Yes PT needs to help fit for it etc,.  If she would like this referral I will be glad to proceed with the order.

## 2017-08-13 DIAGNOSIS — H26493 Other secondary cataract, bilateral: Secondary | ICD-10-CM | POA: Diagnosis not present

## 2017-08-13 DIAGNOSIS — H43813 Vitreous degeneration, bilateral: Secondary | ICD-10-CM | POA: Diagnosis not present

## 2017-08-13 DIAGNOSIS — H04123 Dry eye syndrome of bilateral lacrimal glands: Secondary | ICD-10-CM | POA: Diagnosis not present

## 2017-08-14 NOTE — Telephone Encounter (Signed)
Advised patient to follow plan of care from Dr Parks Ranger as he is the one treating her for her potassium level. She verbalized understanding and no further questions at this time.

## 2017-08-18 ENCOUNTER — Other Ambulatory Visit: Payer: Self-pay

## 2017-08-18 DIAGNOSIS — E875 Hyperkalemia: Secondary | ICD-10-CM

## 2017-08-18 DIAGNOSIS — R110 Nausea: Secondary | ICD-10-CM

## 2017-08-18 DIAGNOSIS — Z90710 Acquired absence of both cervix and uterus: Secondary | ICD-10-CM

## 2017-08-19 ENCOUNTER — Other Ambulatory Visit: Payer: Self-pay | Admitting: Family Medicine

## 2017-08-19 ENCOUNTER — Encounter: Payer: Self-pay | Admitting: Pain Medicine

## 2017-08-19 DIAGNOSIS — H34211 Partial retinal artery occlusion, right eye: Secondary | ICD-10-CM

## 2017-08-19 DIAGNOSIS — E782 Mixed hyperlipidemia: Secondary | ICD-10-CM

## 2017-08-19 DIAGNOSIS — E875 Hyperkalemia: Secondary | ICD-10-CM

## 2017-08-19 MED ORDER — ONDANSETRON 4 MG PO TBDP: 4.0000 mg | ORAL_TABLET | Freq: Three times a day (TID) | ORAL | 2 refills | Status: DC | PRN

## 2017-08-19 MED ORDER — ASPIRIN EC 81 MG PO TBEC: 81.0000 mg | DELAYED_RELEASE_TABLET | Freq: Every day | ORAL | 3 refills | Status: DC

## 2017-08-19 MED ORDER — NEOMYCIN-POLYMYXIN-HC 3.5-10000-1 OT SOLN: 2.0000 [drp] | Freq: Two times a day (BID) | OTIC | 0 refills | Status: DC

## 2017-08-19 MED ORDER — ASPIRIN EC 81 MG PO TBEC: 81.0000 mg | DELAYED_RELEASE_TABLET | Freq: Every day | ORAL | Status: DC

## 2017-08-19 MED ORDER — ESTRADIOL 1 MG PO TABS: 1.0000 mg | ORAL_TABLET | Freq: Every day | ORAL | 1 refills | Status: DC

## 2017-08-19 NOTE — Telephone Encounter (Signed)
Declined Furosemide refill, she has already completed the treatment. She she needs to return for blood work for potassium in 1-2 weeks from now. Or she can have this drawn through Cardiology.  Nobie Putnam, [facility] 08/19/2017, 5:07 PM

## 2017-08-22 ENCOUNTER — Ambulatory Visit: Payer: Medicare HMO | Admitting: Pain Medicine

## 2017-08-23 ENCOUNTER — Telehealth: Payer: Self-pay | Admitting: Nurse Practitioner

## 2017-08-23 NOTE — Telephone Encounter (Signed)
Patient states she is having pain from left sided back of hip around to front thigh, down to knee and makes knee go numb. I have PT order but is this supposed to be for a brace? If so I will need written script stating that and what it is for, please.

## 2017-08-24 ENCOUNTER — Other Ambulatory Visit: Payer: Self-pay | Admitting: Family Medicine

## 2017-08-24 DIAGNOSIS — E875 Hyperkalemia: Secondary | ICD-10-CM

## 2017-08-26 NOTE — Telephone Encounter (Signed)
Does she want an apt for an injection? I will provide you with a written order for the knee brace. She still needs PT.

## 2017-08-27 ENCOUNTER — Ambulatory Visit
Admission: RE | Admit: 2017-08-27 | Discharge: 2017-08-27 | Disposition: A | Discharge status: Home / Self Care | Payer: Medicare HMO | Source: Ambulatory Visit | Attending: Pain Medicine | Admitting: Pain Medicine

## 2017-08-27 ENCOUNTER — Ambulatory Visit (HOSPITAL_BASED_OUTPATIENT_CLINIC_OR_DEPARTMENT_OTHER): Payer: Medicare HMO | Admitting: Pain Medicine

## 2017-08-27 ENCOUNTER — Encounter: Payer: Self-pay | Admitting: Pain Medicine

## 2017-08-27 ENCOUNTER — Other Ambulatory Visit: Payer: Self-pay

## 2017-08-27 VITALS — BP 153/80 | HR 59 | Temp 97.5°F | Resp 16 | Ht 62.0 in | Wt 171.0 lb

## 2017-08-27 DIAGNOSIS — Z79891 Long term (current) use of opiate analgesic: Secondary | ICD-10-CM | POA: Diagnosis not present

## 2017-08-27 DIAGNOSIS — R51 Headache: Secondary | ICD-10-CM | POA: Insufficient documentation

## 2017-08-27 DIAGNOSIS — M542 Cervicalgia: Secondary | ICD-10-CM | POA: Diagnosis not present

## 2017-08-27 DIAGNOSIS — Z7982 Long term (current) use of aspirin: Secondary | ICD-10-CM | POA: Insufficient documentation

## 2017-08-27 DIAGNOSIS — Z9049 Acquired absence of other specified parts of digestive tract: Secondary | ICD-10-CM | POA: Diagnosis not present

## 2017-08-27 DIAGNOSIS — Z9889 Other specified postprocedural states: Secondary | ICD-10-CM | POA: Insufficient documentation

## 2017-08-27 DIAGNOSIS — M47812 Spondylosis without myelopathy or radiculopathy, cervical region: Secondary | ICD-10-CM | POA: Diagnosis not present

## 2017-08-27 DIAGNOSIS — M5481 Occipital neuralgia: Secondary | ICD-10-CM | POA: Diagnosis not present

## 2017-08-27 DIAGNOSIS — Z79899 Other long term (current) drug therapy: Secondary | ICD-10-CM | POA: Diagnosis not present

## 2017-08-27 DIAGNOSIS — Z888 Allergy status to other drugs, medicaments and biological substances status: Secondary | ICD-10-CM | POA: Insufficient documentation

## 2017-08-27 DIAGNOSIS — Z90710 Acquired absence of both cervix and uterus: Secondary | ICD-10-CM | POA: Diagnosis not present

## 2017-08-27 DIAGNOSIS — Z9689 Presence of other specified functional implants: Secondary | ICD-10-CM | POA: Diagnosis not present

## 2017-08-27 DIAGNOSIS — G4486 Cervicogenic headache: Secondary | ICD-10-CM

## 2017-08-27 DIAGNOSIS — Z881 Allergy status to other antibiotic agents status: Secondary | ICD-10-CM | POA: Insufficient documentation

## 2017-08-27 DIAGNOSIS — Z9884 Bariatric surgery status: Secondary | ICD-10-CM | POA: Diagnosis not present

## 2017-08-27 MED ORDER — FENTANYL CITRATE (PF) 100 MCG/2ML IJ SOLN
25.0000 ug | INTRAMUSCULAR | Status: DC | PRN
  Administered 2017-08-27: 50 ug via INTRAVENOUS

## 2017-08-27 MED ORDER — DEXAMETHASONE SODIUM PHOSPHATE 10 MG/ML IJ SOLN
10.0000 mg | Freq: Once | INTRAMUSCULAR | Status: AC
  Administered 2017-08-27: 10 mg

## 2017-08-27 MED ORDER — LACTATED RINGERS IV SOLN
1000.0000 mL | Freq: Once | INTRAVENOUS | Status: AC
  Administered 2017-08-27: 1000 mL via INTRAVENOUS

## 2017-08-27 MED ORDER — ROPIVACAINE HCL 2 MG/ML IJ SOLN
9.0000 mL | Freq: Once | INTRAMUSCULAR | Status: AC
  Administered 2017-08-27: 10 mL via PERINEURAL

## 2017-08-27 MED ORDER — LIDOCAINE HCL 2 % IJ SOLN
INTRAMUSCULAR | Status: AC
  Filled 2017-08-27: qty 20

## 2017-08-27 MED ORDER — FENTANYL CITRATE (PF) 100 MCG/2ML IJ SOLN
INTRAMUSCULAR | Status: AC
  Filled 2017-08-27: qty 2

## 2017-08-27 MED ORDER — DEXAMETHASONE SODIUM PHOSPHATE 10 MG/ML IJ SOLN
INTRAMUSCULAR | Status: AC
  Filled 2017-08-27: qty 1

## 2017-08-27 MED ORDER — MIDAZOLAM HCL 5 MG/5ML IJ SOLN
1.0000 mg | INTRAMUSCULAR | Status: DC | PRN
  Administered 2017-08-27: 2 mg via INTRAVENOUS

## 2017-08-27 MED ORDER — LIDOCAINE HCL 2 % IJ SOLN
20.0000 mL | Freq: Once | INTRAMUSCULAR | Status: AC
  Administered 2017-08-27: 400 mg
  Filled 2017-08-27: qty 20

## 2017-08-27 MED ORDER — MIDAZOLAM HCL 5 MG/5ML IJ SOLN
INTRAMUSCULAR | Status: AC
  Filled 2017-08-27: qty 5

## 2017-08-27 MED ORDER — ROPIVACAINE HCL 2 MG/ML IJ SOLN
INTRAMUSCULAR | Status: AC
  Filled 2017-08-27: qty 10

## 2017-08-27 NOTE — Progress Notes (Signed)
Safety precautions to be maintained throughout the outpatient stay will include: orient to surroundings, keep bed in low position, maintain call bell within reach at all times, provide assistance with transfer out of bed and ambulation.  

## 2017-08-27 NOTE — Patient Instructions (Signed)

## 2017-08-27 NOTE — Progress Notes (Signed)
Patient's Name: Ashley Fields  MRN: [id number]  Referring Provider: Milinda Pointer, MD  DOB: January 24, 1955  PCP: Olin Hauser, DO  DOS: 08/27/2017  Note by: Gaspar Cola, MD  Service setting: Ambulatory outpatient  Specialty: Interventional Pain Management  Patient type: Established  Location: [facility] (AMB) Pain Management Facility  Visit type: Interventional Procedure   Primary Reason for Visit: Interventional Pain Management Treatment. CC: Neck Pain (bilateral, left is worse)  Procedure:          Anesthesia, Analgesia, Anxiolysis:  Type: Cervical Facet Medial Branch Block(s) #1  Primary Purpose: Diagnostic Region: Posterolateral cervical spine Level: C3, C4, C5, C6, & C7 Medial Branch Level(s). Injecting these levels blocks the C3-4, C4-5, C5-6, and C6-7 cervical facet joints. Laterality: Left  Type: Moderate (Conscious) Sedation combined with Local Anesthesia Indication(s): Analgesia and Anxiety Route: Intravenous (IV) IV Access: Secured Sedation: Meaningful verbal contact was maintained at all times during the procedure  Local Anesthetic: Lidocaine 1-2%   Indications: 1. Spondylosis without myelopathy or radiculopathy, cervical region   2. Cervicalgia (Left)   3. Cervical facet syndrome (Left)   4. Cervicogenic headache (Left)   5. Cervico-occipital neuralgia (Left)    Pain Score: Pre-procedure: 3 /10 Post-procedure: 0-No pain/10  Pre-op Assessment:  [name] is a 62 y.o. (year old), female patient, seen today for interventional treatment. She  has a past surgical history that includes Cesarean section (07/27/1981); Abdominal hysterectomy (1987); Breast reduction surgery (Bilateral, 1997); Cholecystectomy (03/2004); Appendectomy (12/2008); Gastric bypass (2005); mini tummy tuck (05/07/2016); breast lift bilateral, implants (05/18/2015); Lumbar laminectomy (06/2006); 5 miscarriages; neck surgery C3-C7 (02/10/2015); IVC FILTER INSERTION (12/2003); Spinal cord  stimulator implant (11/2010); Shoulder acromioplasty (Left, 03/27/2013); Reduction mammaplasty (Bilateral, 1997); Augmentation mammaplasty (Bilateral, 2015); and Augmentation mammaplasty (Bilateral, 2018). [name] has a current medication list which includes the following prescription(s): aripiprazole, aspirin ec, biotin, butalbital-acetaminophen-caffeine, cholecalciferol, estradiol, etodolac, ezetimibe, ferrous sulfate, fluticasone, furosemide, gabapentin, lorazepam, magnesium, melatonin, multivitamin with minerals, neomycin-polymyxin-hydrocortisone, omeprazole, ondansetron, rosuvastatin, sertraline, tizanidine, tramadol, trazodone, turmeric, vitamin b-12, vitamin e, and hydrocodone-acetaminophen, and the following Facility-Administered Medications: fentanyl and midazolam. Her primarily concern today is the Neck Pain (bilateral, left is worse)  Initial Vital Signs:  Pulse/HCG Rate: (!) 59ECG Heart Rate: 64 Temp: 98.7 F (37.1 C) Resp: 18 BP: 140/75 SpO2: 99 %  BMI: Estimated body mass index is 31.28 kg/m as calculated from the following:   Height as of this encounter: 5\' 2"  (1.575 m).   Weight as of this encounter: 171 lb (77.6 kg).  Risk Assessment: Allergies: Reviewed. She is allergic to flagyl [metronidazole] and tape.  Allergy Precautions: None required Coagulopathies: Reviewed. None identified.  Blood-thinner therapy: None at this time Active Infection(s): Reviewed. None identified. [name] is afebrile  Site Confirmation: [name] was asked to confirm the procedure and laterality before marking the site Procedure checklist: Completed Consent: Before the procedure and under the influence of no sedative(s), amnesic(s), or anxiolytics, the patient was informed of the treatment options, risks and possible complications. To fulfill our ethical and legal obligations, as recommended by the American Medical Association's Code of Ethics, I have informed the patient of my clinical  impression; the nature and purpose of the treatment or procedure; the risks, benefits, and possible complications of the intervention; the alternatives, including doing nothing; the risk(s) and benefit(s) of the alternative treatment(s) or procedure(s); and the risk(s) and benefit(s) of doing nothing. The patient was provided information about the general risks and possible complications associated with the procedure. These may  include, but are not limited to: failure to achieve desired goals, infection, bleeding, organ or nerve damage, allergic reactions, paralysis, and death. In addition, the patient was informed of those risks and complications associated to Spine-related procedures, such as failure to decrease pain; infection (i.e.: Meningitis, epidural or intraspinal abscess); bleeding (i.e.: epidural hematoma, subarachnoid hemorrhage, or any other type of intraspinal or peri-dural bleeding); organ or nerve damage (i.e.: Any type of peripheral nerve, nerve root, or spinal cord injury) with subsequent damage to sensory, motor, and/or autonomic systems, resulting in permanent pain, numbness, and/or weakness of one or several areas of the body; allergic reactions; (i.e.: anaphylactic reaction); and/or death. Furthermore, the patient was informed of those risks and complications associated with the medications. These include, but are not limited to: allergic reactions (i.e.: anaphylactic or anaphylactoid reaction(s)); adrenal axis suppression; blood sugar elevation that in diabetics may result in ketoacidosis or comma; water retention that in patients with history of congestive heart failure may result in shortness of breath, pulmonary edema, and decompensation with resultant heart failure; weight gain; swelling or edema; medication-induced neural toxicity; particulate matter embolism and blood vessel occlusion with resultant organ, and/or nervous system infarction; and/or aseptic necrosis of one or more  joints. Finally, the patient was informed that Medicine is not an exact science; therefore, there is also the possibility of unforeseen or unpredictable risks and/or possible complications that may result in a catastrophic outcome. The patient indicated having understood very clearly. We have given the patient no guarantees and we have made no promises. Enough time was given to the patient to ask questions, all of which were answered to the patient's satisfaction. [name] has indicated that she wanted to continue with the procedure. Attestation: I, the ordering provider, attest that I have discussed with the patient the benefits, risks, side-effects, alternatives, likelihood of achieving goals, and potential problems during recovery for the procedure that I have provided informed consent. Date  Time: 08/27/2017  7:49 AM  Pre-Procedure Preparation:  Monitoring: As per clinic protocol. Respiration, ETCO2, SpO2, BP, heart rate and rhythm monitor placed and checked for adequate function Safety Precautions: Patient was assessed for positional comfort and pressure points before starting the procedure. Time-out: I initiated and conducted the "Time-out" before starting the procedure, as per protocol. The patient was asked to participate by confirming the accuracy of the "Time Out" information. Verification of the correct person, site, and procedure were performed and confirmed by me, the nursing staff, and the patient. "Time-out" conducted as per Joint Commission's Universal Protocol (UP.01.01.01). Time: 3664  Description of Procedure:          Position: Prone with head of the table raised to facilitate breathing. Laterality: Left Level: C3, C4, C5, C6, & C7 Medial Branch Level(s). Area Prepped: Posterior Cervico-thoracic Region Prepping solution: ChloraPrep (2% chlorhexidine gluconate and 70% isopropyl alcohol) Safety Precautions: Aspiration looking for blood return was conducted prior to all  injections. At no point did we inject any substances, as a needle was being advanced. Before injecting, the patient was told to immediately notify me if she was experiencing any new onset of "ringing in the ears, or metallic taste in the mouth". No attempts were made at seeking any paresthesias. Safe injection practices and needle disposal techniques used. Medications properly checked for expiration dates. SDV (single dose vial) medications used. After the completion of the procedure, all disposable equipment used was discarded in the proper designated medical waste containers. Local Anesthesia: Protocol guidelines were followed. The patient was  positioned over the fluoroscopy table. The area was prepped in the usual manner. The time-out was completed. The target area was identified using fluoroscopy. A 12-in long, straight, sterile hemostat was used with fluoroscopic guidance to locate the targets for each level blocked. Once located, the skin was marked with an approved surgical skin marker. Once all sites were marked, the skin (epidermis, dermis, and hypodermis), as well as deeper tissues (fat, connective tissue and muscle) were infiltrated with a small amount of a short-acting local anesthetic, loaded on a 10cc syringe with a 25G, 1.5-in  Needle. An appropriate amount of time was allowed for local anesthetics to take effect before proceeding to the next step. Local Anesthetic: Lidocaine 2.0% The unused portion of the local anesthetic was discarded in the proper designated containers. Technical explanation of process:  C3 Medial Branch Nerve Block (MBB): The target area for the C3 dorsal medial articular branch is the lateral concave waist of the articular pillar of C3. Under fluoroscopic guidance, a Quincke needle was inserted until contact was made with os over the postero-lateral aspect of the articular pillar of C3 (target area). After negative aspiration for blood, 0.5 mL of the nerve block solution was  injected without difficulty or complication. The needle was removed intact. C4 Medial Branch Nerve Block (MBB): The target area for the C4 dorsal medial articular branch is the lateral concave waist of the articular pillar of C4. Under fluoroscopic guidance, a Quincke needle was inserted until contact was made with os over the postero-lateral aspect of the articular pillar of C4 (target area). After negative aspiration for blood, 0.5 mL of the nerve block solution was injected without difficulty or complication. The needle was removed intact. C5 Medial Branch Nerve Block (MBB): The target area for the C5 dorsal medial articular branch is the lateral concave waist of the articular pillar of C5. Under fluoroscopic guidance, a Quincke needle was inserted until contact was made with os over the postero-lateral aspect of the articular pillar of C5 (target area). After negative aspiration for blood, 0.5 mL of the nerve block solution was injected without difficulty or complication. The needle was removed intact. C6 Medial Branch Nerve Block (MBB): The target area for the C6 dorsal medial articular branch is the lateral concave waist of the articular pillar of C6. Under fluoroscopic guidance, a Quincke needle was inserted until contact was made with os over the postero-lateral aspect of the articular pillar of C6 (target area). After negative aspiration for blood, 0.5 mL of the nerve block solution was injected without difficulty or complication. The needle was removed intact. C7 Medial Branch Nerve Block (MBB): The target for the C7 dorsal medial articular branch lies on the superior-medial tip of the C7 transverse process. Under fluoroscopic guidance, a Quincke needle was inserted until contact was made with os over the postero-lateral aspect of the articular pillar of C7 (target area). After negative aspiration for blood, 0.5 mL of the nerve block solution was injected without difficulty or complication. The needle  was removed intact. Procedural Needles: 22-gauge, 3.5-inch, Quincke needles used for all levels. Nerve block solution: 0.2% PF-Ropivacaine + Triamcinolone (40 mg/mL) diluted to a final concentration of 4 mg of Triamcinolone/mL of Ropivacaine The unused portion of the solution was discarded in the proper designated containers.  Once the entire procedure was completed, the treated area was cleaned, making sure to leave some of the prepping solution back to take advantage of its long term bactericidal properties.  Vitals:   08/27/17  0914 08/27/17 0920 08/27/17 0930 08/27/17 0940  BP: (!) 177/91 (!) 177/82 (!) 175/79 (!) 153/80  Pulse:      Resp: 16 16 14 16   Temp: 98.1 F (36.7 C)   (!) 97.5 F (36.4 C)  TempSrc: Temporal   Temporal  SpO2: 99% 99% 100% 99%  Weight:      Height:        Start Time: 0859 hrs. End Time: 0906 hrs.  Imaging Guidance (Spinal):          Type of Imaging Technique: Fluoroscopy Guidance (Spinal) Indication(s): Assistance in needle guidance and placement for procedures requiring needle placement in or near specific anatomical locations not easily accessible without such assistance. Exposure Time: Please see nurses notes. Contrast: None used. Fluoroscopic Guidance: I was personally present during the use of fluoroscopy. "Tunnel Vision Technique" used to obtain the best possible view of the target area. Parallax error corrected before commencing the procedure. "Direction-depth-direction" technique used to introduce the needle under continuous pulsed fluoroscopy. Once target was reached, antero-posterior, oblique, and lateral fluoroscopic projection used confirm needle placement in all planes. Images permanently stored in EMR. Interpretation: No contrast injected. I personally interpreted the imaging intraoperatively. Adequate needle placement confirmed in multiple planes. Permanent images saved into the patient's record.  Antibiotic Prophylaxis:   Anti-infectives  (From admission, onward)   None     Indication(s): None identified  Post-operative Assessment:  Post-procedure Vital Signs:  Pulse/HCG Rate: (!) 5974 Temp: (!) 97.5 F (36.4 C) Resp: 16 BP: (!) 153/80 SpO2: 99 %  EBL: None  Complications: No immediate post-treatment complications observed by team, or reported by patient.  Note: The patient tolerated the entire procedure well. A repeat set of vitals were taken after the procedure and the patient was kept under observation following institutional policy, for this type of procedure. Post-procedural neurological assessment was performed, showing return to baseline, prior to discharge. The patient was provided with post-procedure discharge instructions, including a section on how to identify potential problems. Should any problems arise concerning this procedure, the patient was given instructions to immediately contact us, at any time, without hesitation. In any case, we plan to contact the patient by telephone for a follow-up status report regarding this interventional procedure.  Comments:  No additional relevant information.  Plan of Care    Imaging Orders     DG C-Arm 1-60 Min-No Report  Procedure Orders     CERVICAL FACET (MEDIAL BRANCH NERVE BLOCK)   Medications ordered for procedure: Meds ordered this encounter  Medications  . lidocaine (XYLOCAINE) 2 % (with pres) injection 400 mg  . midazolam (VERSED) 5 MG/5ML injection 1-2 mg    Make sure Flumazenil is available in the pyxis when using this medication. If oversedation occurs, administer 0.2 mg IV over 15 sec. If after 45 sec no response, administer 0.2 mg again over 1 min; may repeat at 1 min intervals; not to exceed 4 doses (1 mg)  . fentaNYL (SUBLIMAZE) injection 25-50 mcg    Make sure Narcan is available in the pyxis when using this medication. In the event of respiratory depression (RR< 8/min): Titrate NARCAN (naloxone) in increments of 0.1 to 0.2 mg IV at 2-3 minute  intervals, until desired degree of reversal.  . lactated ringers infusion 1,000 mL  . ropivacaine (PF) 2 mg/mL (0.2%) (NAROPIN) injection 9 mL  . dexamethasone (DECADRON) injection 10 mg   Medications administered: We administered lidocaine, midazolam, fentaNYL, lactated ringers, ropivacaine (PF) 2 mg/mL (0.2%), and dexamethasone.  See the medical record for exact dosing, route, and time of administration.  New Prescriptions   No medications on file   Disposition: Discharge home  Discharge Date & Time: 08/27/2017; 0945 hrs.   Physician-requested Follow-up: Return for post-procedure eval (2 wks), w/ Dr. Dossie Arbour.  Future Appointments  Date Time Provider [place]  09/12/2017  8:45 AM Vevelyn Francois, NP [facility] None  09/23/2017  8:15 AM Milinda Pointer, MD [facility] None  11/13/2017  8:00 AM Parks Ranger Devonne Doughty, DO [facility] None   Primary Care Physician: Olin Hauser, DO Location: [facility] Facility Note by: Gaspar Cola, MD Date: 08/27/2017; Time: 10:56 AM  Disclaimer:  Medicine is not an exact science. The only guarantee in medicine is that nothing is guaranteed. It is important to note that the decision to proceed with this intervention was based on the information collected from the patient. The Data and conclusions were drawn from the patient's questionnaire, the interview, and the physical examination. Because the information was provided in large part by the patient, it cannot be guaranteed that it has not been purposely or unconsciously manipulated. Every effort has been made to obtain as much relevant data as possible for this evaluation. It is important to note that the conclusions that lead to this procedure are derived in large part from the available data. Always take into account that the treatment will also be dependent on availability of resources and existing treatment guidelines, considered by other Pain Management  Practitioners as being common knowledge and practice, at the time of the intervention. For Medico-Legal purposes, it is also important to point out that variation in procedural techniques and pharmacological choices are the acceptable norm. The indications, contraindications, technique, and results of the above procedure should only be interpreted and judged by a Board-Certified Interventional Pain Specialist with extensive familiarity and expertise in the same exact procedure and technique.

## 2017-08-28 ENCOUNTER — Telehealth: Payer: Self-pay | Admitting: *Deleted

## 2017-08-28 NOTE — Telephone Encounter (Signed)
Spoke with patient re; procedure on yesterday.  States she has a tiny headache.  Patient had cervical facets on yesterday.  She has taken tramadol and fioricet, states headache is not continuous.  States she continues to have a little swellinig and will continue with ice.  Does report good pain relief in the neck.  Will call if headache gets worse.

## 2017-09-02 NOTE — Telephone Encounter (Signed)
Can you contact patient to see what her question is and if she wants to schedule this week that is fine to follow-up on the lab result?  She is asking me about scheduling, and it looks like the time slot she is requesting is booked.  You can route her to front office to schedule if she just wants to discuss in office, if she doesn't have a more simple question.  Nobie Putnam, [facility] 09/02/2017, 12:33 PM

## 2017-09-03 ENCOUNTER — Other Ambulatory Visit: Payer: Medicare HMO

## 2017-09-03 DIAGNOSIS — E875 Hyperkalemia: Secondary | ICD-10-CM

## 2017-09-03 LAB — BASIC METABOLIC PANEL WITH GFR
BUN: 14 mg/dL (ref 7–25)
CALCIUM: 8.9 mg/dL (ref 8.6–10.4)
CO2: 30 mmol/L (ref 20–32)
CREATININE: 0.95 mg/dL (ref 0.50–0.99)
Chloride: 106 mmol/L (ref 98–110)
GFR, Est African American: 75 mL/min/{1.73_m2} (ref >=60)
GFR, Est Non African American: 65 mL/min/{1.73_m2} (ref >=60)
Glucose, Bld: 74 mg/dL (ref 65–99)
Potassium: 4.9 mmol/L (ref 3.5–5.3)
Sodium: 141 mmol/L (ref 135–146)

## 2017-09-04 ENCOUNTER — Other Ambulatory Visit: Payer: Self-pay | Admitting: Family Medicine

## 2017-09-12 ENCOUNTER — Encounter: Payer: Self-pay | Admitting: Nurse Practitioner

## 2017-09-12 ENCOUNTER — Other Ambulatory Visit: Payer: Self-pay

## 2017-09-12 ENCOUNTER — Ambulatory Visit: Payer: Medicare HMO | Attending: Nurse Practitioner | Admitting: Nurse Practitioner

## 2017-09-12 VITALS — BP 113/65 | HR 72 | Temp 98.0°F | Resp 16 | Ht 62.0 in | Wt 175.0 lb

## 2017-09-12 DIAGNOSIS — M5481 Occipital neuralgia: Secondary | ICD-10-CM | POA: Insufficient documentation

## 2017-09-12 DIAGNOSIS — M48061 Spinal stenosis, lumbar region without neurogenic claudication: Secondary | ICD-10-CM | POA: Insufficient documentation

## 2017-09-12 DIAGNOSIS — G8929 Other chronic pain: Secondary | ICD-10-CM

## 2017-09-12 DIAGNOSIS — M5030 Other cervical disc degeneration, unspecified cervical region: Secondary | ICD-10-CM | POA: Diagnosis not present

## 2017-09-12 DIAGNOSIS — M5442 Lumbago with sciatica, left side: Secondary | ICD-10-CM | POA: Diagnosis not present

## 2017-09-12 DIAGNOSIS — F4310 Post-traumatic stress disorder, unspecified: Secondary | ICD-10-CM | POA: Diagnosis not present

## 2017-09-12 DIAGNOSIS — Z87891 Personal history of nicotine dependence: Secondary | ICD-10-CM | POA: Diagnosis not present

## 2017-09-12 DIAGNOSIS — M533 Sacrococcygeal disorders, not elsewhere classified: Secondary | ICD-10-CM | POA: Insufficient documentation

## 2017-09-12 DIAGNOSIS — Z801 Family history of malignant neoplasm of trachea, bronchus and lung: Secondary | ICD-10-CM | POA: Insufficient documentation

## 2017-09-12 DIAGNOSIS — Z79899 Other long term (current) drug therapy: Secondary | ICD-10-CM | POA: Insufficient documentation

## 2017-09-12 DIAGNOSIS — Z8249 Family history of ischemic heart disease and other diseases of the circulatory system: Secondary | ICD-10-CM | POA: Insufficient documentation

## 2017-09-12 DIAGNOSIS — M5441 Lumbago with sciatica, right side: Secondary | ICD-10-CM

## 2017-09-12 DIAGNOSIS — E782 Mixed hyperlipidemia: Secondary | ICD-10-CM | POA: Insufficient documentation

## 2017-09-12 DIAGNOSIS — R0989 Other specified symptoms and signs involving the circulatory and respiratory systems: Secondary | ICD-10-CM | POA: Insufficient documentation

## 2017-09-12 DIAGNOSIS — Z9049 Acquired absence of other specified parts of digestive tract: Secondary | ICD-10-CM | POA: Insufficient documentation

## 2017-09-12 DIAGNOSIS — Z9884 Bariatric surgery status: Secondary | ICD-10-CM | POA: Insufficient documentation

## 2017-09-12 DIAGNOSIS — Z87442 Personal history of urinary calculi: Secondary | ICD-10-CM | POA: Insufficient documentation

## 2017-09-12 DIAGNOSIS — Z79891 Long term (current) use of opiate analgesic: Secondary | ICD-10-CM | POA: Insufficient documentation

## 2017-09-12 DIAGNOSIS — F329 Major depressive disorder, single episode, unspecified: Secondary | ICD-10-CM | POA: Insufficient documentation

## 2017-09-12 DIAGNOSIS — M160 Bilateral primary osteoarthritis of hip: Secondary | ICD-10-CM | POA: Diagnosis not present

## 2017-09-12 DIAGNOSIS — G9619 Other disorders of meninges, not elsewhere classified: Secondary | ICD-10-CM | POA: Insufficient documentation

## 2017-09-12 DIAGNOSIS — N189 Chronic kidney disease, unspecified: Secondary | ICD-10-CM | POA: Insufficient documentation

## 2017-09-12 DIAGNOSIS — K219 Gastro-esophageal reflux disease without esophagitis: Secondary | ICD-10-CM | POA: Insufficient documentation

## 2017-09-12 DIAGNOSIS — M47812 Spondylosis without myelopathy or radiculopathy, cervical region: Secondary | ICD-10-CM | POA: Diagnosis not present

## 2017-09-12 DIAGNOSIS — Z7951 Long term (current) use of inhaled steroids: Secondary | ICD-10-CM | POA: Diagnosis not present

## 2017-09-12 DIAGNOSIS — R69 Illness, unspecified: Secondary | ICD-10-CM | POA: Diagnosis not present

## 2017-09-12 DIAGNOSIS — G894 Chronic pain syndrome: Secondary | ICD-10-CM | POA: Diagnosis not present

## 2017-09-12 DIAGNOSIS — Z90710 Acquired absence of both cervix and uterus: Secondary | ICD-10-CM | POA: Insufficient documentation

## 2017-09-12 DIAGNOSIS — Z9889 Other specified postprocedural states: Secondary | ICD-10-CM | POA: Insufficient documentation

## 2017-09-12 DIAGNOSIS — M25512 Pain in left shoulder: Secondary | ICD-10-CM | POA: Insufficient documentation

## 2017-09-12 DIAGNOSIS — Z833 Family history of diabetes mellitus: Secondary | ICD-10-CM | POA: Insufficient documentation

## 2017-09-12 DIAGNOSIS — Z8371 Family history of colonic polyps: Secondary | ICD-10-CM | POA: Insufficient documentation

## 2017-09-12 DIAGNOSIS — M810 Age-related osteoporosis without current pathological fracture: Secondary | ICD-10-CM | POA: Insufficient documentation

## 2017-09-12 DIAGNOSIS — Z818 Family history of other mental and behavioral disorders: Secondary | ICD-10-CM | POA: Insufficient documentation

## 2017-09-12 DIAGNOSIS — Z7982 Long term (current) use of aspirin: Secondary | ICD-10-CM | POA: Diagnosis not present

## 2017-09-12 DIAGNOSIS — M792 Neuralgia and neuritis, unspecified: Secondary | ICD-10-CM

## 2017-09-12 DIAGNOSIS — M4322 Fusion of spine, cervical region: Secondary | ICD-10-CM | POA: Diagnosis not present

## 2017-09-12 DIAGNOSIS — Z825 Family history of asthma and other chronic lower respiratory diseases: Secondary | ICD-10-CM | POA: Insufficient documentation

## 2017-09-12 DIAGNOSIS — M25562 Pain in left knee: Secondary | ICD-10-CM | POA: Diagnosis not present

## 2017-09-12 DIAGNOSIS — Z888 Allergy status to other drugs, medicaments and biological substances status: Secondary | ICD-10-CM | POA: Diagnosis not present

## 2017-09-12 DIAGNOSIS — Z823 Family history of stroke: Secondary | ICD-10-CM | POA: Insufficient documentation

## 2017-09-12 MED ORDER — TRAMADOL HCL 50 MG PO TABS: 50.0000 mg | ORAL_TABLET | Freq: Four times a day (QID) | ORAL | 0 refills | Status: DC | PRN

## 2017-09-12 MED ORDER — TIZANIDINE HCL 4 MG PO TABS: 4.0000 mg | ORAL_TABLET | Freq: Three times a day (TID) | ORAL | 1 refills | Status: DC

## 2017-09-12 MED ORDER — GABAPENTIN 800 MG PO TABS: 800.0000 mg | ORAL_TABLET | Freq: Three times a day (TID) | ORAL | 0 refills | Status: DC

## 2017-09-12 NOTE — Progress Notes (Signed)
Safety precautions to be maintained throughout the outpatient stay will include: orient to surroundings, keep bed in low position, maintain call bell within reach at all times, provide assistance with transfer out of bed and ambulation.  Nursing Pain Medication Assessment:  Safety precautions to be maintained throughout the outpatient stay will include: orient to surroundings, keep bed in low position, maintain call bell within reach at all times, provide assistance with transfer out of bed and ambulation.  Medication Inspection Compliance: Pill count conducted under aseptic conditions, in front of the patient. Neither the pills nor the bottle was removed from the patient's sight at any time. Once count was completed pills were immediately returned to the patient in their original bottle.  Medication: Tramadol (Ultram) Pill/Patch Count: 0 of 120 pills remain Pill/Patch Appearance: Markings consistent with prescribed medication Bottle Appearance: Standard pharmacy container. Clearly labeled. Filled Date: 08 / 02 / 2019 Last Medication intake:  Today

## 2017-09-12 NOTE — Patient Instructions (Addendum)
___You have been given electronic prescriptions for Tramadol to last until 11/12/17. Also e-prescription for Gabapentin and Zanaflex.   _________________________________________________________________________________________  Medication Rules  Applies to: All patients receiving prescriptions (written or electronic).  Pharmacy of record: Pharmacy where electronic prescriptions will be sent. If written prescriptions are taken to a different pharmacy, please inform the nursing staff. The pharmacy listed in the electronic medical record should be the one where you would like electronic prescriptions to be sent.  Prescription refills: Only during scheduled appointments. Applies to both, written and electronic prescriptions.  NOTE: The following applies primarily to controlled substances (Opioid* Pain Medications).   Patient's responsibilities: 1. Pain Pills: Bring all pain pills to every appointment (except for procedure appointments). 2. Pill Bottles: Bring pills in original pharmacy bottle. Always bring newest bottle. Bring bottle, even if empty. 3. Medication refills: You are responsible for knowing and keeping track of what medications you need refilled. The day before your appointment, write a list of all prescriptions that need to be refilled. Bring that list to your appointment and give it to the admitting nurse. Prescriptions will be written only during appointments. If you forget a medication, it will not be "Called in", "Faxed", or "electronically sent". You will need to get another appointment to get these prescribed. 4. Prescription Accuracy: You are responsible for carefully inspecting your prescriptions before leaving our office. Have the discharge nurse carefully go over each prescription with you, before taking them home. Make sure that your name is accurately spelled, that your address is correct. Check the name and dose of your medication to make sure it is accurate. Check the number of  pills, and the written instructions to make sure they are clear and accurate. Make sure that you are given enough medication to last until your next medication refill appointment. 5. Taking Medication: Take medication as prescribed. Never take more pills than instructed. Never take medication more frequently than prescribed. Taking less pills or less frequently is permitted and encouraged, when it comes to controlled substances (written prescriptions).  6. Inform other Doctors: Always inform, all of your healthcare providers, of all the medications you take. 7. Pain Medication from other Providers: You are not allowed to accept any additional pain medication from any other Doctor or Healthcare provider. There are two exceptions to this rule. (see below) In the event that you require additional pain medication, you are responsible for notifying us, as stated below. 8. Medication Agreement: You are responsible for carefully reading and following our Medication Agreement. This must be signed before receiving any prescriptions from our practice. Safely store a copy of your signed Agreement. Violations to the Agreement will result in no further prescriptions. (Additional copies of our Medication Agreement are available upon request.) 9. Laws, Rules, & Regulations: All patients are expected to follow all Federal and [employer], [employer], Rules, [employer]. Ignorance of the Laws does not constitute a valid excuse. The use of any illegal substances is prohibited. 10. Adopted CDC guidelines & recommendations: Target dosing levels will be at or below 60 MME/day. Use of benzodiazepines** is not recommended.  Exceptions: There are only two exceptions to the rule of not receiving pain medications from other Healthcare Providers. 1. Exception #1 (Emergencies): In the event of an emergency (i.e.: accident requiring emergency care), you are allowed to receive additional pain medication. However, you are responsible for:  As soon as you are able, call our office [phone], at any time of the day or night, and leave  a message stating your name, the date and nature of the emergency, and the name and dose of the medication prescribed. In the event that your call is answered by a member of our staff, make sure to document and save the date, time, and the name of the person that took your information.  2. Exception #2 (Planned Surgery): In the event that you are scheduled by another doctor or dentist to have any type of surgery or procedure, you are allowed (for a period no longer than 30 days), to receive additional pain medication, for the acute post-op pain. However, in this case, you are responsible for picking up a copy of our "Post-op Pain Management for Surgeons" handout, and giving it to your surgeon or dentist. This document is available at our office, and does not require an appointment to obtain it. Simply go to our office during business hours (Monday-Thursday from 8:00 AM to 4:00 PM) (Friday 8:00 AM to 12:00 Noon) or if you have a scheduled appointment with [place], prior to your surgery, and ask for it by name. In addition, you will need to provide [place] with your name, name of your surgeon, type of surgery, and date of procedure or surgery.  *Opioid medications include: morphine, codeine, oxycodone, oxymorphone, hydrocodone, hydromorphone, meperidine, tramadol, tapentadol, buprenorphine, fentanyl, methadone. **Benzodiazepine medications include: diazepam (Valium), alprazolam (Xanax), clonazepam (Klonopine), lorazepam (Ativan), clorazepate (Tranxene), chlordiazepoxide (Librium), estazolam (Prosom), oxazepam (Serax), temazepam (Restoril), triazolam (Halcion) (Last updated: 03/07/2017) ____________________________________________________________________________________________

## 2017-09-12 NOTE — Progress Notes (Addendum)
Patient's Name: Ashley Fields  MRN: [id number]  Referring Provider: Nobie Putnam *  DOB: 09/09/1955  PCP: Olin Hauser, DO  DOS: 09/12/2017  Note by: Vevelyn Francois NP  Service setting: Ambulatory outpatient  Specialty: Interventional Pain Management  Location: [facility] (AMB) Pain Management Facility    Patient type: Established    Primary Reason(s) for Visit: Encounter for prescription drug management & post-procedure evaluation of chronic illness with mild to moderate exacerbation(Level of risk: moderate) CC: Medication Refill (Tramadol)  HPI  Ashley Fields is a 62 y.o. year old, female patient, who comes today for a post-procedure evaluation and medication management. She has Chronic pain syndrome; Chronic neck pain ; Chronic hip pain (Secondary Area of Pain) (Bilateral) (L>R); Long term current use of opiate analgesic; Long term prescription opiate use; Opiate use; Chronic knee pain (Left); Osteoarthritis of hip (Bilateral); Chronic Low back pain (Primary Area of Pain) (Bilateral)  (R>L); Chronic lower extremity pain (Tertiary Area of Pain) (Bilateral) (L>R); Long term prescription benzodiazepine use; Grade 1 Anterolisthesis of L3 over L4; Failed back surgical syndrome; Chronic shoulder pain (Left); History of cervical fusion (ACDF C4-C7); DDD (degenerative disc disease), cervical; Chronic sacroiliac joint pain (Bilateral) (R>L); Lumbar Facet Syndrome (Bilateral) (R>L); Lumbar facet osteoarthritis (Bilateral); DDD (degenerative disc disease), lumbar; History of hepatitis C; Hepatic steatosis; History of nephrolithiasis; Lumbar spinal stenosis (severe) (L3-4); Lumbar lateral recess stenosis (Bilateral) (L3-4); Lumbar foraminal stenosis (L3-4) (Left); PTSD (post-traumatic stress disorder); GERD (gastroesophageal reflux disease); Coccygodynia; Hollenhorst plaque, right eye; Family history of coronary artery disease; Carotid bruit; Mixed hyperlipidemia; Other specified dorsopathies, sacral and  sacrococcygeal region; Neurogenic pain; Abnormal intestinal absorption; S/P gastric bypass; Epidural fibrosis; Cervico-occipital neuralgia (Left); Cervicogenic headache (Left); Cervical facet syndrome (Left); Spondylosis without myelopathy or radiculopathy, cervical region; and Cervicalgia (Left) on their problem list. Her primarily concern today is the Medication Refill (Tramadol)  Pain Assessment: Location: Lower, Left Back Radiating: "Radiates from lower back down to side of hip, towards front of thigh and to front of knee but it is not constant" Onset: More than a month ago Duration: Chronic pain Quality: Radiating, Dull, Aching, Numbness Severity: 3 /10 (subjective, self-reported pain score)  Note: Reported level is compatible with observation.                          Effect on ADL: "can't do everything I need to do"  Timing: Constant(low back pain) Modifying factors: Rest and knee brace  BP: 113/65  HR: 72  Ashley Fields was last seen on 08/28/2017 for a procedure. During today's appointment we reviewed Ashley Fields's post-procedure results, as well as her outpatient medication regimen.  She feels like she is having hypoglycemia from the tramadol.  She admits that she has read at this as a side effect.  She is status post gastric bypass surgery and has had a history of hypoglycemia mom before being prescribed tramadol.  She feels that it may be worse with the tramadol.  She admits that the tramadol is not effective for her pain  Further details on both, my assessment(s), as well as the proposed treatment plan, please see below.  Controlled Substance Pharmacotherapy Assessment REMS (Risk Evaluation and Mitigation Strategy)  Analgesic:Tramadol 50 mg, 1 tab PO q6hrs (200 mg/day) MME/day:86m/day RJanne Napoleon RN  09/12/2017  3:16 PM  Signed Safety precautions to be maintained throughout the outpatient stay will include: orient to surroundings, keep bed in low position, maintain call bell  within reach  at all times, provide assistance with transfer out of bed and ambulation.  Nursing Pain Medication Assessment:  Safety precautions to be maintained throughout the outpatient stay will include: orient to surroundings, keep bed in low position, maintain call bell within reach at all times, provide assistance with transfer out of bed and ambulation.  Medication Inspection Compliance: Pill count conducted under aseptic conditions, in front of the patient. Neither the pills nor the bottle was removed from the patient's sight at any time. Once count was completed pills were immediately returned to the patient in their original bottle.  Medication: Tramadol (Ultram) Pill/Patch Count: 0 of 120 pills remain Pill/Patch Appearance: Markings consistent with prescribed medication Bottle Appearance: Standard pharmacy container. Clearly labeled. Filled Date: 08 / 02 / 2019 Last Medication intake:  Today   Pharmacokinetics: Liberation and absorption (onset of action): WNL Distribution (time to peak effect): WNL Metabolism and excretion (duration of action): WNL         Pharmacodynamics: Desired effects: Analgesia: Ashley Fields reports >50% benefit. Functional ability: Patient reports that medication allows her to accomplish basic ADLs Clinically meaningful improvement in function (CMIF): Sustained CMIF goals met Perceived effectiveness: Described as relatively effective but with some room for improvement Undesirable effects: Side-effects or Adverse reactions: None reported Monitoring: [place] PMP: Online review of the past 2-monthperiod conducted. Compliant with practice rules and regulations Last UDS on record: Summary  Date Value Ref Range Status  02/11/2017 FINAL  Final    Comment:    ==================================================================== TOXASSURE SELECT 13 (MW) ==================================================================== Test                             Result        Flag       Units Drug Present and Declared for Prescription Verification   Lorazepam                      811          EXPECTED   ng/mg creat    Source of lorazepam is a scheduled prescription medication.   Tramadol                       >2674        EXPECTED   ng/mg creat   O-Desmethyltramadol            >2674        EXPECTED   ng/mg creat   N-Desmethyltramadol            >2674        EXPECTED   ng/mg creat    Source of tramadol is a prescription medication.    O-desmethyltramadol and N-desmethyltramadol are expected    metabolites of tramadol. Drug Absent but Declared for Prescription Verification   Butalbital                     Not Detected UNEXPECTED ==================================================================== Test                      Result    Flag   Units      Ref Range   Creatinine              187              mg/dL      >=20 ==================================================================== Declared Medications:  The flagging and interpretation on this  report are based on the  following declared medications.  Unexpected results may arise from  inaccuracies in the declared medications.  **Note: The testing scope of this panel includes these medications:  Butalbital (Butalbital/APAP/Caffeine)  Lorazepam  Tramadol  **Note: The testing scope of this panel does not include following  reported medications:  Acetaminophen (Butalbital/APAP/Caffeine)  Caffeine (Butalbital/APAP/Caffeine)  Estradiol  Fluticasone  Gabapentin  Iron (Ferrous Sulfate)  Melatonin  Multivitamin  Omeprazole  Sertraline  Simvastatin  Tizanidine '  Trazodone  Turmeric  Vitamin B (Biotin)  Vitamin C ==================================================================== For clinical consultation, please call [phone]. ====================================================================    UDS interpretation: Compliant          Medication Assessment Form: Reviewed. Patient  indicates being compliant with therapy Treatment compliance: Non-compliant taking more medication than prescribed Risk Assessment Profile: Aberrant behavior: continued use despite claims of ineffective analgesia, frequent involvement in accidents and taking more medication than prescribed Comorbid factors increasing risk of overdose: Benzodiazepine use, caucasian, concomitant use of Benzodiazepines, nicotine dependence and Posttraumatic stress disorder Opioid risk tool (ORT) (Total Score): 1 Personal History of Substance Abuse (SUD-Substance use disorder):  Alcohol: Negative  Illegal Drugs: Negative  Rx Drugs: Negative  ORT Risk Level calculation: Low Risk Risk of substance use disorder (SUD): Moderate-to-High Opioid Risk Tool - 09/12/17 0903      Family History of Substance Abuse   Alcohol  Negative    Illegal Drugs  Negative    Rx Drugs  Negative      Personal History of Substance Abuse   Alcohol  Negative    Illegal Drugs  Negative    Rx Drugs  Negative      Age   Age between 8-45 years   No      History of Preadolescent Sexual Abuse   History of Preadolescent Sexual Abuse  Negative or Female      Psychological Disease   Psychological Disease  Negative    Depression  Positive      Total Score   Opioid Risk Tool Scoring  1    Opioid Risk Interpretation  Low Risk      ORT Scoring interpretation table:  Score <3 = Low Risk for SUD  Score between 4-7 = Moderate Risk for SUD  Score >8 = High Risk for Opioid Abuse   Risk Mitigation Strategies:  Patient Counseling: Covered Patient-Prescriber Agreement (PPA): Present and active  Notification to other healthcare providers: Done  Pharmacologic Plan: After careful consideration, I have decided to decline Ashley Fields's request for an increase in the daily dose of her opioid analgesics. Instaed we will address the flare-up with interventional management techniques.  Post-Procedure Assessment  08/27/2017 Procedure: Left cervical  facet nerve block Pre-procedure pain score:  3/10 Post-procedure pain score: 0/10         Influential Factors: BMI: 32.01 kg/m Intra-procedural challenges: None observed.         Assessment challenges: None detected.              Reported side-effects: None.        Post-procedural adverse reactions or complications: None reported         Sedation: Please see nurses note. When no sedatives are used, the analgesic levels obtained are directly associated to the effectiveness of the local anesthetics. However, when sedation is provided, the level of analgesia obtained during the initial 1 hour following the intervention, is believed to be the result of a combination of factors. These factors may include, but are  not limited to: 1. The effectiveness of the local anesthetics used. 2. The effects of the analgesic(s) and/or anxiolytic(s) used. 3. The degree of discomfort experienced by the patient at the time of the procedure. 4. The patients ability and reliability in recalling and recording the events. 5. The presence and influence of possible secondary gains and/or psychosocial factors. Reported result: Relief experienced during the 1st hour after the procedure: 100 % (Ultra-Short Term Relief)            Interpretative annotation: Clinically appropriate result. Analgesia during this period is likely to be Local Anesthetic and/or IV Sedative (Analgesic/Anxiolytic) related.          Effects of local anesthetic: The analgesic effects attained during this period are directly associated to the localized infiltration of local anesthetics and therefore cary significant diagnostic value as to the etiological location, or anatomical origin, of the pain. Expected duration of relief is directly dependent on the pharmacodynamics of the local anesthetic used. Long-acting (4-6 hours) anesthetics used.  Reported result: Relief during the next 4 to 6 hour after the procedure: 90 % (Short-Term Relief)             Interpretative annotation: Clinically appropriate result. Analgesia during this period is likely to be Local Anesthetic-related.          Long-term benefit: Defined as the period of time past the expected duration of local anesthetics (1 hour for short-acting and 4-6 hours for long-acting). With the possible exception of prolonged sympathetic blockade from the local anesthetics, benefits during this period are typically attributed to, or associated with, other factors such as analgesic sensory neuropraxia, antiinflammatory effects, or beneficial biochemical changes provided by agents other than the local anesthetics.  Reported result: Extended relief following procedure: 80 % (Long-Term Relief)            Interpretative annotation: Clinically possible results. Good relief. No permanent benefit expected. Inflammation plays a part in the etiology to the pain.          Current benefits: Defined as reported results that persistent at this point in time.   Analgesia: <75 %            Function: Somewhat improved ROM: Somewhat improved Interpretative annotation: Good relief.    Effective diagnostic intervention.          Interpretation: Results would suggest a successful diagnostic intervention.                  Plan:  Please see "Plan of Care" for details.                Laboratory Chemistry  Inflammation Markers (CRP: Acute Phase) (ESR: Chronic Phase) Lab Results  Component Value Date   CRP 0.7 08/14/2016   ESRSEDRATE 9 08/14/2016                         Rheumatology Markers No results found for: RF, ANA, LABURIC, URICUR, LYMEIGGIGMAB, LYMEABIGMQN, HLAB27                      Renal Function Markers Lab Results  Component Value Date   BUN 14 09/03/2017   CREATININE 0.95 88/91/6945   BCR NOT APPLICABLE 03/88/8280   GFRAA 75 09/03/2017   GFRNONAA 65 09/03/2017                             Hepatic Function  Markers Lab Results  Component Value Date   AST 23 07/30/2017   ALT 16  07/30/2017   ALBUMIN 4.9 (H) 08/14/2016   ALKPHOS 103 08/14/2016                        Electrolytes Lab Results  Component Value Date   NA 141 09/03/2017   K 4.9 09/03/2017   CL 106 09/03/2017   CALCIUM 8.9 09/03/2017   MG 2.3 08/14/2016                        Neuropathy Markers Lab Results  Component Value Date   VITAMINB12 989 07/30/2017   HGBA1C 4.9 07/30/2017                        CNS Tests No results found for: SDES, GRAMSTAIN, CULT, COLORCSF, APPEARCSF, RBCCOUNTCSF, WBCCSF, POLYSCSF, LYMPHSCSF, EOSCSF, PROTEINCSF, GLUCCSF, JCVIRUS, CSFOLI, IGGCSF, IGGALB, IGGIND                      Bone Pathology Markers Lab Results  Component Value Date   VD25OH 37 07/30/2017   25OHVITD1 47 08/14/2016   25OHVITD2 <1.0 08/14/2016   25OHVITD3 47 08/14/2016                         Coagulation Parameters Lab Results  Component Value Date   PLT 163 07/30/2017                        Cardiovascular Markers Lab Results  Component Value Date   HGB 12.9 07/30/2017   HCT 39.0 07/30/2017                         CA Markers No results found for: CEA, CA125, LABCA2                      Note: Lab results reviewed.  Recent Diagnostic Imaging Results  DG C-Arm 1-60 Min-No Report Fluoroscopy was utilized by the requesting physician.  No radiographic  interpretation.   Complexity Note: Imaging results reviewed. Results shared with Ashley Fields, using Layman's terms.                         Meds   Current Outpatient Medications:  .  ARIPiprazole (ABILIFY) 5 MG tablet, TAKE 1 TABLET BY MOUTH EVERY DAY, Disp: 90 tablet, Rfl: 1 .  aspirin EC 81 MG tablet, Take 1 tablet (81 mg total) by mouth daily., Disp: 90 tablet, Rfl: 3 .  Biotin 10000 MCG TABS, Take 10,000 mcg by mouth 1 day or 1 dose., Disp: , Rfl:  .  butalbital-acetaminophen-caffeine (FIORICET, ESGIC) 50-325-40 MG tablet, Take 1-2 tablets by mouth every 6 (six) hours as needed for headache., Disp: 90 tablet, Rfl: 3 .   cholecalciferol (VITAMIN D) 1000 units tablet, Take 1,000 Units by mouth daily., Disp: , Rfl:  .  estradiol (ESTRACE) 1 MG tablet, Take 1 tablet (1 mg total) by mouth daily., Disp: 90 tablet, Rfl: 1 .  etodolac (LODINE) 500 MG tablet, Take 1 tablet (500 mg total) by mouth 2 (two) times daily., Disp: 60 tablet, Rfl: 1 .  ferrous sulfate 325 (65 FE) MG tablet, Take 325 mg by mouth daily., Disp: , Rfl:  .  fluticasone (FLONASE) 50  MCG/ACT nasal spray, 1 SPRAY IN EACH NOSTRIL ONCE A DAY NASALLY 30 DAYS as needed, Disp: , Rfl: 3 .  [START ON 09/13/2017] gabapentin (NEURONTIN) 800 MG tablet, Take 1 tablet (800 mg total) by mouth 3 (three) times daily., Disp: 180 tablet, Rfl: 0 .  LORazepam (ATIVAN) 0.5 MG tablet, Take 1 tablet (0.5 mg total) by mouth 2 (two) times daily as needed for anxiety., Disp: 45 tablet, Rfl: 2 .  Magnesium 500 MG CAPS, Take 1 capsule (500 mg total) by mouth 2 (two) times daily at 8 am and 10 pm., Disp: 60 capsule, Rfl: 5 .  Melatonin 10 MG CAPS, Take 1 capsule by mouth at bedtime., Disp: , Rfl:  .  Multiple Vitamin (MULTIVITAMIN WITH MINERALS) TABS tablet, Take 1 tablet by mouth daily., Disp: , Rfl:  .  neomycin-polymyxin-hydrocortisone (CORTISPORIN) OTIC solution, Place 2 drops into both ears 2 (two) times daily., Disp: 10 mL, Rfl: 0 .  omeprazole (PRILOSEC) 40 MG capsule, Take 40 mg by mouth daily., Disp: , Rfl: 0 .  ondansetron (ZOFRAN ODT) 4 MG disintegrating tablet, Take 1 tablet (4 mg total) by mouth every 8 (eight) hours as needed for nausea or vomiting., Disp: 30 tablet, Rfl: 2 .  sertraline (ZOLOFT) 100 MG tablet, Take 1 tablet (100 mg total) by mouth 2 (two) times daily., Disp: 180 tablet, Rfl: 1 .  [START ON 09/13/2017] tiZANidine (ZANAFLEX) 4 MG tablet, Take 1 tablet (4 mg total) by mouth 3 (three) times daily., Disp: 90 tablet, Rfl: 1 .  [START ON 09/13/2017] traMADol (ULTRAM) 50 MG tablet, Take 1 tablet (50 mg total) by mouth every 6 (six) hours as needed for severe pain.,  Disp: 120 tablet, Rfl: 0 .  traZODone (DESYREL) 150 MG tablet, Take 150 mg by mouth at bedtime., Disp: , Rfl: 0 .  Turmeric 500 MG CAPS, Take 2 capsules by mouth daily., Disp: , Rfl:  .  vitamin B-12 (CYANOCOBALAMIN) 500 MCG tablet, Take 500 mcg by mouth daily., Disp: , Rfl:  .  vitamin E 100 UNIT capsule, Take by mouth daily., Disp: , Rfl:  .  ezetimibe (ZETIA) 10 MG tablet, Take 1 tablet (10 mg total) by mouth daily., Disp: 90 tablet, Rfl: 3 .  rosuvastatin (CRESTOR) 40 MG tablet, Take 1 tablet (40 mg total) by mouth daily., Disp: 90 tablet, Rfl: 3 .  [START ON 10/13/2017] traMADol (ULTRAM) 50 MG tablet, Take 1 tablet (50 mg total) by mouth every 6 (six) hours as needed for severe pain., Disp: 120 tablet, Rfl: 0  ROS  Constitutional: Denies any fever or chills Gastrointestinal: No reported hemesis, hematochezia, vomiting, or acute GI distress Musculoskeletal: Denies any acute onset joint swelling, redness, loss of ROM, or weakness Neurological: No reported episodes of acute onset apraxia, aphasia, dysarthria, agnosia, amnesia, paralysis, loss of coordination, or loss of consciousness  Allergies  Ashley Fields is allergic to flagyl [metronidazole] and tape.  PFSH  Drug: Ashley Fields  reports that she does not use drugs. Alcohol:  reports that she does not drink alcohol. Tobacco:  reports that she has quit smoking. She quit after 4.00 years of use. She has never used smokeless tobacco. Medical:  has a past medical history of Allergy, Anemia, Chronic kidney disease, Depression, Disp fx of cuboid bone of right foot, init for clos fx (01/01/2017), GERD (gastroesophageal reflux disease), Headache, Hepatitis C, Hyperlipidemia, Osteoporosis, Thyroid disease, and Urine incontinence. Surgical: Ashley Fields  has a past surgical history that includes Cesarean section (07/27/1981); Abdominal hysterectomy (  1987); Breast reduction surgery (Bilateral, 1997); Cholecystectomy (03/2004); Appendectomy (12/2008);  Gastric bypass (2005); mini tummy tuck (05/07/2016); breast lift bilateral, implants (05/18/2015); Lumbar laminectomy (06/2006); 5 miscarriages; neck surgery C3-C7 (02/10/2015); IVC FILTER INSERTION (12/2003); Spinal cord stimulator implant (11/2010); Shoulder acromioplasty (Left, 03/27/2013); Reduction mammaplasty (Bilateral, 1997); Augmentation mammaplasty (Bilateral, 2015); and Augmentation mammaplasty (Bilateral, 2018). Family: family history includes COPD in her mother; Colon polyps in her sister; Depression in her sister; Diabetes in her brother and mother; Heart attack in her brother, mother, and sister; Heart attack (age of onset: 14) in her father; Heart disease in her brother, father, and mother; Lung cancer in her mother; Stroke in her mother.  Constitutional Exam  General appearance: Well nourished, well developed, and well hydrated. In no apparent acute distress Vitals:   09/12/17 0849  BP: 113/65  Pulse: 72  Resp: 16  Temp: 98 F (36.7 C)  SpO2: 97%  Weight: 175 lb (79.4 kg)  Height: 5' 2"  (1.575 m)  Psych/Mental status: Alert, oriented x 3 (person, place, & time)       Eyes: PERLA Respiratory: No evidence of acute respiratory distress  Cervical Spine Area Exam  Skin & Axial Inspection: No masses, redness, edema, swelling, or associated skin lesions Alignment: Symmetrical Functional ROM: Adequate ROM      Stability: No instability detected Muscle Tone/Strength: Functionally intact. No obvious neuro-muscular anomalies detected. Sensory (Neurological): Unimpaired Palpation: No palpable anomalies              Upper Extremity (UE) Exam    Side: Right upper extremity  Side: Left upper extremity  Skin & Extremity Inspection: Skin color, temperature, and hair growth are WNL. No peripheral edema or cyanosis. No masses, redness, swelling, asymmetry, or associated skin lesions. No contractures.  Skin & Extremity Inspection: Skin color, temperature, and hair growth are WNL. No  peripheral edema or cyanosis. No masses, redness, swelling, asymmetry, or associated skin lesions. No contractures.  Functional ROM: Adequate ROM          Functional ROM: Decreased ROM          Muscle Tone/Strength: Functionally intact. No obvious neuro-muscular anomalies detected.  Muscle Tone/Strength: Functionally intact. No obvious neuro-muscular anomalies detected.  Sensory (Neurological): Unimpaired          Sensory (Neurological): Unimpaired          Palpation: No palpable anomalies              Palpation: No palpable anomalies                   Gait & Posture Assessment  Ambulation: Patient ambulates using a cane Gait: Relatively normal for age and body habitus Posture: WNL   Lower Extremity Exam    Side: Right lower extremity  Side: Left lower extremity  Stability: No instability observed          Stability: No instability observed          Skin & Extremity Inspection: Skin color, temperature, and hair growth are WNL. No peripheral edema or cyanosis. No masses, redness, swelling, asymmetry, or associated skin lesions. No contractures.  Skin & Extremity Inspection: Edema  Functional ROM: Unrestricted ROM                  Functional ROM: Guarding                  Muscle Tone/Strength: Functionally intact. No obvious neuro-muscular anomalies detected.  Muscle Tone/Strength: Functionally intact. No obvious neuro-muscular  anomalies detected.  Sensory (Neurological): Unimpaired  Sensory (Neurological): Unimpaired   Assessment  Primary Diagnosis & Pertinent Problem List: The primary encounter diagnosis was Chronic knee pain (Left). Diagnoses of Chronic Low back pain (Primary Area of Pain) (Bilateral)  (R>L), DDD (degenerative disc disease), cervical, Neurogenic pain, Gastroesophageal reflux disease without esophagitis, and Chronic pain syndrome were also pertinent to this visit.  Status Diagnosis  Worsening persistent Controlled Controlled 1. Chronic knee pain (Left)   2. Chronic Low  back pain (Primary Area of Pain) (Bilateral)  (R>L)   3. DDD (degenerative disc disease), cervical   4. Neurogenic pain   5. Gastroesophageal reflux disease without esophagitis   6. Chronic pain syndrome     Problems updated and reviewed during this visit: No problems updated. Plan of Care  Pharmacotherapy (Medications Ordered): Meds ordered this encounter  Medications  . gabapentin (NEURONTIN) 800 MG tablet    Sig: Take 1 tablet (800 mg total) by mouth 3 (three) times daily.    Dispense:  180 tablet    Refill:  0    Do not place this medication, or any other prescription from our practice, on "Automatic Refill". Patient may have prescription filled one day early if pharmacy is closed on scheduled refill date.    Order Specific Question:   Supervising Provider    Answer:   Milinda Pointer [phone]  . tiZANidine (ZANAFLEX) 4 MG tablet    Sig: Take 1 tablet (4 mg total) by mouth 3 (three) times daily.    Dispense:  90 tablet    Refill:  1    Do not place medication on "Automatic Refill". Fill one day early if pharmacy is closed on scheduled refill date.    Order Specific Question:   Supervising Provider    Answer:   Milinda Pointer [phone]  . traMADol (ULTRAM) 50 MG tablet    Sig: Take 1 tablet (50 mg total) by mouth every 6 (six) hours as needed for severe pain.    Dispense:  120 tablet    Refill:  0    Do not fill until:09/13/2017 To last until:10/13/2017    Order Specific Question:   Supervising Provider    Answer:   Milinda Pointer [phone]  . traMADol (ULTRAM) 50 MG tablet    Sig: Take 1 tablet (50 mg total) by mouth every 6 (six) hours as needed for severe pain.    Dispense:  120 tablet    Refill:  0    Do not fill until: 10/13/2017 To last until:11/12/2017    Order Specific Question:   Supervising Provider    Answer:   Milinda Pointer [phone]   New Prescriptions   TRAMADOL (ULTRAM) 50 MG TABLET    Take 1 tablet (50 mg total) by mouth every 6 (six) hours as  needed for severe pain.   Medications administered today: Kurt Azimi had no medications administered during this visit. Lab-work, procedure(s), and/or referral(s): Orders Placed This Encounter  Procedures  . MR KNEE LEFT WO CONTRAST   Imaging and/or referral(s): MR KNEE LEFT WO CONTRAST  Interventional management options: Planned, scheduled, and/or pending:   None at this point.    Considering:   Diagnosticbilateral L3-4 transforaminal LESI Diagnostic bilateral lumbar facet block Possiblebilateral lumbar facetRFA Diagnostic bilateral sacroiliac joint block Possible bilateral sacroiliac joint RFA Diagnosticleft hip injection Diagnosticleft femoral nerve + obturator nerve block Possible left femoral nerve + obturator nerve RFA Diagnostic left knee intra-articular steroid injection Possible series of 5 left intra-articular Hyalgan knee  injection Diagnosticleft genicular nerve block Possible left genicular nerve RFA Diagnosticcervical epidural steroid injection Diagnosticbilateral cervical facet block Possiblebilateral cervical RFA   Palliative PRN treatment(s):   Palliative Racz procedure #2 (last one done on 07/24/2017)     Provider-requested follow-up: Return in about 8 weeks (around 11/04/2017) for MedMgmt with Me Donella Stade Edison Pace).  Future Appointments  Date Time Provider [place]  09/23/2017  8:15 AM Milinda Pointer, MD [facility] None  11/04/2017  8:30 AM Vevelyn Francois, NP [facility] None  11/13/2017  8:00 AM [place] Ranger Devonne Doughty, DO [facility] None   Primary Care Physician: Olin Hauser, DO Location: [facility] Facility Note by: Vevelyn Francois NP Date: 09/12/2017; Time: 3:17 PM  Pain Score Disclaimer: We use the NRS-11 scale. This is a self-reported, subjective measurement of pain severity with only modest accuracy. It is used primarily to identify changes within a particular patient. It  must be understood that outpatient pain scales are significantly less accurate that those used for research, where they can be applied under ideal controlled circumstances with minimal exposure to variables. In reality, the score is likely to be a combination of pain intensity and pain affect, where pain affect describes the degree of emotional arousal or changes in action readiness caused by the sensory experience of pain. Factors such as social and work situation, setting, emotional state, anxiety levels, expectation, and prior pain experience may influence pain perception and show large inter-individual differences that may also be affected by time variables.  Patient instructions provided during this appointment: Patient Instructions  ___You have been given electronic prescriptions for Tramadol to last until 11/12/17. Also e-prescription for Gabapentin and Zanaflex.   _________________________________________________________________________________________  Medication Rules  Applies to: All patients receiving prescriptions (written or electronic).  Pharmacy of record: Pharmacy where electronic prescriptions will be sent. If written prescriptions are taken to a different pharmacy, please inform the nursing staff. The pharmacy listed in the electronic medical record should be the one where you would like electronic prescriptions to be sent.  Prescription refills: Only during scheduled appointments. Applies to both, written and electronic prescriptions.  NOTE: The following applies primarily to controlled substances (Opioid* Pain Medications).   Patient's responsibilities: 1. Pain Pills: Bring all pain pills to every appointment (except for procedure appointments). 2. Pill Bottles: Bring pills in original pharmacy bottle. Always bring newest bottle. Bring bottle, even if empty. 3. Medication refills: You are responsible for knowing and keeping track of what medications you need refilled. The day  before your appointment, write a list of all prescriptions that need to be refilled. Bring that list to your appointment and give it to the admitting nurse. Prescriptions will be written only during appointments. If you forget a medication, it will not be "Called in", "Faxed", or "electronically sent". You will need to get another appointment to get these prescribed. 4. Prescription Accuracy: You are responsible for carefully inspecting your prescriptions before leaving our office. Have the discharge nurse carefully go over each prescription with you, before taking them home. Make sure that your name is accurately spelled, that your address is correct. Check the name and dose of your medication to make sure it is accurate. Check the number of pills, and the written instructions to make sure they are clear and accurate. Make sure that you are given enough medication to last until your next medication refill appointment. 5. Taking Medication: Take medication as prescribed. Never take more pills than instructed. Never take medication more frequently than  prescribed. Taking less pills or less frequently is permitted and encouraged, when it comes to controlled substances (written prescriptions).  6. Inform other Doctors: Always inform, all of your healthcare providers, of all the medications you take. 7. Pain Medication from other Providers: You are not allowed to accept any additional pain medication from any other Doctor or Healthcare provider. There are two exceptions to this rule. (see below) In the event that you require additional pain medication, you are responsible for notifying us, as stated below. 8. Medication Agreement: You are responsible for carefully reading and following our Medication Agreement. This must be signed before receiving any prescriptions from our practice. Safely store a copy of your signed Agreement. Violations to the Agreement will result in no further prescriptions. (Additional copies  of our Medication Agreement are available upon request.) 9. Laws, Rules, & Regulations: All patients are expected to follow all Federal and [employer], [employer], Rules, [employer]. Ignorance of the Laws does not constitute a valid excuse. The use of any illegal substances is prohibited. 10. Adopted CDC guidelines & recommendations: Target dosing levels will be at or below 60 MME/day. Use of benzodiazepines** is not recommended.  Exceptions: There are only two exceptions to the rule of not receiving pain medications from other Healthcare Providers. 1. Exception #1 (Emergencies): In the event of an emergency (i.e.: accident requiring emergency care), you are allowed to receive additional pain medication. However, you are responsible for: As soon as you are able, call our office [phone], at any time of the day or night, and leave a message stating your name, the date and nature of the emergency, and the name and dose of the medication prescribed. In the event that your call is answered by a member of our staff, make sure to document and save the date, time, and the name of the person that took your information.  2. Exception #2 (Planned Surgery): In the event that you are scheduled by another doctor or dentist to have any type of surgery or procedure, you are allowed (for a period no longer than 30 days), to receive additional pain medication, for the acute post-op pain. However, in this case, you are responsible for picking up a copy of our "Post-op Pain Management for Surgeons" handout, and giving it to your surgeon or dentist. This document is available at our office, and does not require an appointment to obtain it. Simply go to our office during business hours (Monday-Thursday from 8:00 AM to 4:00 PM) (Friday 8:00 AM to 12:00 Noon) or if you have a scheduled appointment with [place], prior to your surgery, and ask for it by name. In addition, you will need to provide [place] with your name, name of your  surgeon, type of surgery, and date of procedure or surgery.  *Opioid medications include: morphine, codeine, oxycodone, oxymorphone, hydrocodone, hydromorphone, meperidine, tramadol, tapentadol, buprenorphine, fentanyl, methadone. **Benzodiazepine medications include: diazepam (Valium), alprazolam (Xanax), clonazepam (Klonopine), lorazepam (Ativan), clorazepate (Tranxene), chlordiazepoxide (Librium), estazolam (Prosom), oxazepam (Serax), temazepam (Restoril), triazolam (Halcion) (Last updated: 03/07/2017) ____________________________________________________________________________________________

## 2017-09-13 ENCOUNTER — Telehealth: Payer: Self-pay | Admitting: Family Medicine

## 2017-09-13 DIAGNOSIS — M5030 Other cervical disc degeneration, unspecified cervical region: Secondary | ICD-10-CM

## 2017-09-13 DIAGNOSIS — M51369 Other intervertebral disc degeneration, lumbar region without mention of lumbar back pain or lower extremity pain: Secondary | ICD-10-CM

## 2017-09-13 DIAGNOSIS — M47812 Spondylosis without myelopathy or radiculopathy, cervical region: Secondary | ICD-10-CM

## 2017-09-13 DIAGNOSIS — M5136 Other intervertebral disc degeneration, lumbar region: Secondary | ICD-10-CM

## 2017-09-13 DIAGNOSIS — M48062 Spinal stenosis, lumbar region with neurogenic claudication: Secondary | ICD-10-CM

## 2017-09-13 DIAGNOSIS — G894 Chronic pain syndrome: Secondary | ICD-10-CM

## 2017-09-13 NOTE — Telephone Encounter (Signed)
Patient contacted our office regarding request for new referral to different pain management office and she requests to go to [facility] for orthopedics and pain management. She has been previously followed by [facility] >1 year now. She is requesting to change for 2nd opinion and different management, has had ineffective results on previous treatments.  I am willing to place referral if [facility] is accepting new patients for pain management, my understanding is that she would need to establish with their orthopedics first and it may be an internal referral from the orthopedics. I am not exactly sure how that referral process works.  I will place both orders for [facility] and we will send to our referral coordinator to see if we can get her scheduled as new patient.  Patient was notified that she will need to call [facility] to learn more about their Pain Management if they are accepting new patients and if this is the right fit for her.  She should be cautious about self discharging from her current [facility] until she has a new appointment with [facility] because I would not plan to rx her chronic Tramadol rx unless she is already scheduled to see [facility].  Nobie Putnam, DO [facility] 09/13/2017, 5:11 PM

## 2017-09-16 ENCOUNTER — Ambulatory Visit: Payer: Medicare HMO

## 2017-09-23 ENCOUNTER — Ambulatory Visit: Payer: Medicare HMO | Admitting: Pain Medicine

## 2017-09-24 ENCOUNTER — Other Ambulatory Visit: Payer: Self-pay | Admitting: Nurse Practitioner

## 2017-09-24 DIAGNOSIS — M25562 Pain in left knee: Principal | ICD-10-CM

## 2017-09-24 DIAGNOSIS — M5136 Other intervertebral disc degeneration, lumbar region: Secondary | ICD-10-CM | POA: Diagnosis not present

## 2017-09-24 DIAGNOSIS — M4310 Spondylolisthesis, site unspecified: Secondary | ICD-10-CM | POA: Diagnosis not present

## 2017-09-24 DIAGNOSIS — G8929 Other chronic pain: Secondary | ICD-10-CM

## 2017-09-24 DIAGNOSIS — M48061 Spinal stenosis, lumbar region without neurogenic claudication: Secondary | ICD-10-CM | POA: Diagnosis not present

## 2017-09-26 ENCOUNTER — Telehealth: Payer: Self-pay

## 2017-09-26 NOTE — Telephone Encounter (Signed)
Pt called and is having Surg on her back 10/21 @ [facility] in [place], She wants to know what does she need to do regarding [place]

## 2017-09-26 NOTE — Telephone Encounter (Signed)
She called and wanted to let us know she is having back surgery on 10/28/17  Dr. Sharyne Richters is doing it.

## 2017-09-29 ENCOUNTER — Encounter: Payer: Self-pay | Admitting: Pain Medicine

## 2017-10-01 ENCOUNTER — Other Ambulatory Visit: Payer: Self-pay | Admitting: Nurse Practitioner

## 2017-10-01 ENCOUNTER — Ambulatory Visit: Payer: Medicare HMO | Admitting: Cardiovascular Disease

## 2017-10-01 ENCOUNTER — Telehealth: Payer: Self-pay | Admitting: Cardiovascular Disease

## 2017-10-01 ENCOUNTER — Encounter: Payer: Self-pay | Admitting: Cardiovascular Disease

## 2017-10-01 ENCOUNTER — Encounter

## 2017-10-01 VITALS — BP 138/88 | HR 67 | Ht 62.0 in | Wt 178.5 lb

## 2017-10-01 DIAGNOSIS — R0602 Shortness of breath: Secondary | ICD-10-CM

## 2017-10-01 DIAGNOSIS — I25118 Atherosclerotic heart disease of native coronary artery with other forms of angina pectoris: Secondary | ICD-10-CM | POA: Diagnosis not present

## 2017-10-01 DIAGNOSIS — Z01810 Encounter for preprocedural cardiovascular examination: Secondary | ICD-10-CM

## 2017-10-01 DIAGNOSIS — I2510 Atherosclerotic heart disease of native coronary artery without angina pectoris: Secondary | ICD-10-CM | POA: Insufficient documentation

## 2017-10-01 DIAGNOSIS — R69 Illness, unspecified: Secondary | ICD-10-CM | POA: Diagnosis not present

## 2017-10-01 DIAGNOSIS — F17200 Nicotine dependence, unspecified, uncomplicated: Secondary | ICD-10-CM

## 2017-10-01 DIAGNOSIS — E782 Mixed hyperlipidemia: Secondary | ICD-10-CM | POA: Diagnosis not present

## 2017-10-01 MED ORDER — ETODOLAC 500 MG PO TABS: 500.0000 mg | ORAL_TABLET | Freq: Two times a day (BID) | ORAL | 1 refills | Status: AC

## 2017-10-01 NOTE — Progress Notes (Signed)
Cardiology Office Note  Date:  10/01/2017   ID:  Ashley, Fields 02/02/1955, MRN [id number]  PCP:  Olin Hauser, DO   Chief Complaint  Patient presents with  . OTHER    6 month f/u pt needs cardiac clearance c/o headache. Meds reviewed verbally with pt.    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 Marcello Moores Dimmig  No change in status since her last clinic visit He denies any significant chest pain on exertion concerning for angina  Stress test 06/13/2017 results reviewed with her in detail Pharmacological myocardial perfusion imaging study with no significant  ischemia Normal wall motion, EF estimated at 83% No EKG changes concerning for ischemia at peak stress or in recovery. Low risk scan  Chronic mild shortness of breath with exertion Remote smoking history for at least 15 years  Walks with a cane limited by back pain   Total cholesterol and LDL now at goal  EKG personally reviewed by myself on todays visit Shows normal sinus rhythm rate 67 no Bpm no significant ST or T-wave changes No change from previous EKG  Other past medical history reviewed CT chest  calcium score of 2400  aortic atherosclerosis noted "Coronary arteries: Marked LM and 3 vessel coronary calcification" Ascending Aorta: Calcific aortic atherosclerosis normal diameter 3.4 cm  minimal carotid disease bilaterally less than 39% bilaterally  Family history loss of her sister recently, due to cardiac arrest father died 26 MI Sister 43 died cardiac arrest    PMH:    has a past medical history of Allergy, Anemia, Chronic kidney disease, Depression, Disp fx of cuboid bone of right foot, init for clos fx (01/01/2017), GERD (gastroesophageal reflux disease), Headache, Hepatitis C, Hyperlipidemia, Osteoporosis, Thyroid disease, and Urine incontinence.  PSH:    Past Surgical History:  Procedure Laterality Date  . 5 miscarriages     1977-1985, Blood transfusion s/p miscarriage 1977  . ABDOMINAL HYSTERECTOMY  1987   Total  . APPENDECTOMY  12/2008  . AUGMENTATION MAMMAPLASTY Bilateral 2015   Bilat  . AUGMENTATION MAMMAPLASTY Bilateral 2018   implants redone w/ placement of implants under muscle  . breast lift bilateral, implants  08/65/7846   bilateral, silicon naturel  . BREAST REDUCTION SURGERY Bilateral 1997  . CESAREAN SECTION  07/27/1981   Placenta Previa  . CHOLECYSTECTOMY  03/2004   Lap surgery  . GASTRIC BYPASS  2005   Laparoscopic  . IVC FILTER INSERTION  12/2003   TrapEase Vena Cava Filter  . LUMBAR LAMINECTOMY  06/2006   L4-L5 (spinal fusion)  . mini tummy tuck  05/07/2016   Bilateral bra/back roll lift skin removal  . neck surgery C3-C7  02/10/2015   ACDF  . REDUCTION MAMMAPLASTY Bilateral 1997  . SHOULDER ACROMIOPLASTY Left 03/27/2013   w/ labral debridement  . SPINAL CORD STIMULATOR IMPLANT  11/2010   removed 05/2011    Current Outpatient Medications  Medication Sig Dispense Refill  . ARIPiprazole (ABILIFY) 5 MG tablet TAKE 1 TABLET BY MOUTH EVERY DAY 90 tablet 1  . aspirin EC 81 MG tablet Take 1 tablet (81  mg total) by mouth daily. 90 tablet 3  . Biotin 10000 MCG TABS Take 10,000 mcg by mouth 1 day or 1 dose.    . butalbital-acetaminophen-caffeine (FIORICET, ESGIC) 50-325-40 MG tablet Take 1-2 tablets by mouth every 6 (six) hours as needed for headache. 90 tablet 3  . cholecalciferol (VITAMIN D) 1000 units tablet Take 1,000 Units by mouth daily.    Marland Kitchen estradiol (ESTRACE) 1 MG tablet Take 1 tablet (1 mg total) by mouth daily. 90  tablet 1  . etodolac (LODINE) 500 MG tablet Take 1 tablet (500 mg total) by mouth 2 (two) times daily. 60 tablet 1  . ezetimibe (ZETIA) 10 MG tablet Take 1 tablet (10 mg total) by mouth daily. 90 tablet 3  . ferrous sulfate 325 (65 FE) MG tablet Take 325 mg by mouth daily.    . fluticasone (FLONASE) 50 MCG/ACT nasal spray 1 SPRAY IN EACH NOSTRIL ONCE A DAY NASALLY 30 DAYS as needed  3  . gabapentin (NEURONTIN) 800 MG tablet Take 1 tablet (800 mg total) by mouth 3 (three) times daily. 180 tablet 0  . LORazepam (ATIVAN) 0.5 MG tablet Take 1 tablet (0.5 mg total) by mouth 2 (two) times daily as needed for anxiety. 45 tablet 2  . Magnesium 500 MG CAPS Take 1 capsule (500 mg total) by mouth 2 (two) times daily at 8 am and 10 pm. 60 capsule 5  . Melatonin 10 MG CAPS Take 1 capsule by mouth at bedtime.    . Multiple Vitamin (MULTIVITAMIN WITH MINERALS) TABS tablet Take 1 tablet by mouth daily.    Marland Kitchen neomycin-polymyxin-hydrocortisone (CORTISPORIN) OTIC solution Place 2 drops into both ears 2 (two) times daily. 10 mL 0  . omeprazole (PRILOSEC) 40 MG capsule Take 40 mg by mouth daily.  0  . ondansetron (ZOFRAN ODT) 4 MG disintegrating tablet Take 1 tablet (4 mg total) by mouth every 8 (eight) hours as needed for nausea or vomiting. 30 tablet 2  . rosuvastatin (CRESTOR) 40 MG tablet Take 1 tablet (40 mg total) by mouth daily. 90 tablet 3  . sertraline (ZOLOFT) 100 MG tablet Take 1 tablet (100 mg total) by mouth 2 (two) times daily. 180 tablet 1  . tiZANidine (ZANAFLEX) 4 MG tablet Take 1 tablet (4 mg total) by mouth 3 (three) times daily. 90 tablet 1  . traMADol (ULTRAM) 50 MG tablet Take 1 tablet (50 mg total) by mouth every 6 (six) hours as needed for severe pain. 120 tablet 0  . [START ON 10/13/2017] traMADol (ULTRAM) 50 MG tablet Take 1 tablet (50 mg total) by mouth every 6 (six) hours as needed for severe pain. 120 tablet 0  . Turmeric 500 MG CAPS Take 2 capsules by mouth daily.    . vitamin B-12  (CYANOCOBALAMIN) 500 MCG tablet Take 500 mcg by mouth daily.    . vitamin E 100 UNIT capsule Take by mouth daily.     No current facility-administered medications for this visit.      Allergies:   Flagyl [metronidazole] and Tape   Social History:  The patient  reports that she has quit smoking. She quit after 4.00 years of use. She has never used smokeless tobacco. She reports that she does not drink alcohol or use drugs.   Family History:   family history includes COPD in her mother; Colon polyps in her sister; Depression in her sister; Diabetes in her brother and mother; Heart attack in her brother, mother, and sister; Heart attack (age  of onset: 49) in her father; Heart disease in her brother, father, and mother; Lung cancer in her mother; Stroke in her mother.    Review of Systems: Review of Systems  Constitutional: Negative.   Respiratory: Positive for shortness of breath.   Cardiovascular: Negative.   Gastrointestinal: Negative.   Musculoskeletal: Negative.   Neurological: Negative.   Psychiatric/Behavioral: Negative.   All other systems reviewed and are negative.    PHYSICAL EXAM: VS:  BP 138/88 (BP Location: Left Arm, Patient Position: Sitting, Cuff Size: Normal)   Pulse 67   Ht 5\' 2"  (1.575 m)   Wt 178 lb 8 oz (81 kg)   BMI 32.65 kg/m  , BMI Body mass index is 32.65 kg/m.  No significant change in exam Constitutional:  oriented to person, place, and time. No distress.  HENT:  Head: Normocephalic and atraumatic.  Eyes:  no discharge. No scleral icterus.  Neck: Normal range of motion. Neck supple. No JVD present.  Cardiovascular: Normal rate, regular rhythm, normal heart sounds and intact distal pulses. Exam reveals no gallop and no friction rub. No edema No murmur heard. Pulmonary/Chest: Effort normal and breath sounds normal. No stridor. No respiratory distress.  no wheezes.  no rales.  no tenderness.  Abdominal: Soft.  no distension.  no tenderness.   Musculoskeletal: Normal range of motion.  no  tenderness or deformity.  Neurological:  normal muscle tone. Coordination normal. No atrophy Skin: Skin is warm and dry. No rash noted. not diaphoretic.  Psychiatric:  normal mood and affect. behavior is normal. Thought content normal.   Recent Labs: 07/30/2017: ALT 16; Hemoglobin 12.9; Platelets 163; TSH 1.88 09/03/2017: BUN 14; Creat 0.95; Potassium 4.9; Sodium 141    Lipid Panel Lab Results  Component Value Date   CHOL 142 07/30/2017   HDL 79 07/30/2017   LDLCALC 49 07/30/2017   TRIG 68 07/30/2017      Wt Readings from Last 3 Encounters:  10/01/17 178 lb 8 oz (81 kg)  09/12/17 175 lb (79.4 kg)  08/27/17 171 lb (77.6 kg)      ASSESSMENT AND PLAN:  Preop cardiovascular Acceptable risk for upcoming back surgery with Dr. Joselyn Arrow No further testing needed We will need to determine if she is required to stop aspirin  Hollenhorst plaque, right eye -  Carotid with minimal bilateral disease significant aortic atherosclerosis  CAD Calcium score 2400 on  CT scan Negative stress test Continue Crestor 40 with Zetia 10 Goal LDL less than 70 Long discussion concerning anginal symptoms to watch for  Mixed hyperlipidemia Continue medications as detailed above Numbers discussed with her,  at goal  Smoker  Reports that she quit over 20 years ago Risk factor for atherosclerosis   Total encounter time more than 25 minutes  Greater than 50% was spent in counseling and coordination of care with the patient  Disposition:    Follow-up in 12 months     No orders of the defined types were placed in this encounter.    Signed, Esmond Plants, M.D., Ph.D. 10/01/2017  [place], [place]

## 2017-10-01 NOTE — Telephone Encounter (Signed)
Stress test 06/2017. Routing to Dr Rockey Situ.

## 2017-10-01 NOTE — Telephone Encounter (Signed)
° °  [facility] Pre-operative Risk Assessment    Request for surgical clearance:  1. What type of surgery is being performed? Lumbar Decomp infusion    2. When is this surgery scheduled? 10/28/17   3. What type of clearance is required (medical clearance vs. Pharmacy clearance to hold med vs. Both)? Medical   4. Are there any medications that need to be held prior to surgery and how long?  5. Practice name and name of physician performing surgery? [facility] Dr Marcello Moores Dimmeg   6. What is your office phone number [phone]   7.   What is your office fax number [phone]  8.   Anesthesia type (None, local, MAC, general) ?

## 2017-10-01 NOTE — Telephone Encounter (Signed)
The refill was sent

## 2017-10-01 NOTE — Patient Instructions (Addendum)

## 2017-10-05 ENCOUNTER — Other Ambulatory Visit: Payer: Self-pay

## 2017-10-07 MED ORDER — ROSUVASTATIN CALCIUM 40 MG PO TABS: 40.0000 mg | ORAL_TABLET | Freq: Every day | ORAL | 3 refills | Status: DC

## 2017-10-07 NOTE — Telephone Encounter (Signed)
Elizabeth from [facility] calling Would like a status update on clearance Please call

## 2017-10-07 NOTE — Telephone Encounter (Signed)
S/w Benjamine Mola at [employer]. Let her know we are still waiting on response from Dr Rockey Situ. She verbalized understanding and was appreciative. Routing to Dr Rockey Situ.

## 2017-10-08 NOTE — Telephone Encounter (Signed)
It is in my last office note Acceptable risk

## 2017-10-09 NOTE — Telephone Encounter (Signed)
Copy of this phone encounter and Dr. Donivan Scull office note from 10/01/17 were faxed to Dr. Leslye Peer via [place].

## 2017-10-10 ENCOUNTER — Other Ambulatory Visit: Payer: Self-pay | Admitting: Nurse Practitioner

## 2017-10-10 ENCOUNTER — Telehealth: Payer: Self-pay

## 2017-10-10 MED ORDER — TRAMADOL HCL 50 MG PO TABS: 50.0000 mg | ORAL_TABLET | Freq: Four times a day (QID) | ORAL | 0 refills | Status: DC | PRN

## 2017-10-10 NOTE — Telephone Encounter (Signed)
This was resent

## 2017-10-10 NOTE — Telephone Encounter (Signed)
Patient notified Tramadol script resent.

## 2017-10-10 NOTE — Telephone Encounter (Signed)
Her pharmacy did not receive her escripts this month. [facility]

## 2017-10-10 NOTE — Telephone Encounter (Signed)
I called [facility] to verify this. She filled all the scripts in September, but they did not receive the Tramadol to be filled 10-13-17. Will you send it again?

## 2017-10-12 ENCOUNTER — Other Ambulatory Visit: Payer: Self-pay | Admitting: Cardiovascular Disease

## 2017-10-12 ENCOUNTER — Other Ambulatory Visit: Payer: Self-pay | Admitting: Pain Medicine

## 2017-10-12 DIAGNOSIS — K219 Gastro-esophageal reflux disease without esophagitis: Secondary | ICD-10-CM

## 2017-10-23 DIAGNOSIS — E039 Hypothyroidism, unspecified: Secondary | ICD-10-CM | POA: Diagnosis not present

## 2017-10-23 DIAGNOSIS — Z01812 Encounter for preprocedural laboratory examination: Secondary | ICD-10-CM | POA: Diagnosis not present

## 2017-10-23 DIAGNOSIS — M48061 Spinal stenosis, lumbar region without neurogenic claudication: Secondary | ICD-10-CM | POA: Diagnosis not present

## 2017-10-23 DIAGNOSIS — M4310 Spondylolisthesis, site unspecified: Secondary | ICD-10-CM | POA: Diagnosis not present

## 2017-10-23 DIAGNOSIS — Z01818 Encounter for other preprocedural examination: Secondary | ICD-10-CM | POA: Diagnosis not present

## 2017-10-23 DIAGNOSIS — Z01810 Encounter for preprocedural cardiovascular examination: Secondary | ICD-10-CM | POA: Diagnosis not present

## 2017-10-23 DIAGNOSIS — M5136 Other intervertebral disc degeneration, lumbar region: Secondary | ICD-10-CM | POA: Diagnosis not present

## 2017-10-23 DIAGNOSIS — Z01811 Encounter for preprocedural respiratory examination: Secondary | ICD-10-CM | POA: Diagnosis not present

## 2017-10-25 ENCOUNTER — Telehealth: Payer: Self-pay | Admitting: Pain Medicine

## 2017-10-25 NOTE — Telephone Encounter (Signed)
Ashley Fields [phone]. Packages the patients medications. Calling about refill on magnesium oxate? They need to get refill called in so they can get meds to patient.  Fax [phone] Phone [phone]

## 2017-10-27 ENCOUNTER — Other Ambulatory Visit: Payer: Self-pay | Admitting: Nurse Practitioner

## 2017-10-27 DIAGNOSIS — M792 Neuralgia and neuritis, unspecified: Secondary | ICD-10-CM

## 2017-10-28 ENCOUNTER — Other Ambulatory Visit: Payer: Self-pay | Admitting: Nurse Practitioner

## 2017-10-28 DIAGNOSIS — M545 Low back pain: Secondary | ICD-10-CM | POA: Diagnosis not present

## 2017-10-28 DIAGNOSIS — K219 Gastro-esophageal reflux disease without esophagitis: Secondary | ICD-10-CM | POA: Diagnosis not present

## 2017-10-28 DIAGNOSIS — M48061 Spinal stenosis, lumbar region without neurogenic claudication: Secondary | ICD-10-CM | POA: Diagnosis not present

## 2017-10-28 DIAGNOSIS — M4316 Spondylolisthesis, lumbar region: Secondary | ICD-10-CM | POA: Diagnosis not present

## 2017-10-28 DIAGNOSIS — E039 Hypothyroidism, unspecified: Secondary | ICD-10-CM | POA: Diagnosis not present

## 2017-10-28 DIAGNOSIS — M4310 Spondylolisthesis, site unspecified: Secondary | ICD-10-CM | POA: Diagnosis not present

## 2017-10-28 DIAGNOSIS — R69 Illness, unspecified: Secondary | ICD-10-CM | POA: Diagnosis not present

## 2017-10-28 DIAGNOSIS — M5416 Radiculopathy, lumbar region: Secondary | ICD-10-CM | POA: Diagnosis not present

## 2017-10-28 DIAGNOSIS — M5136 Other intervertebral disc degeneration, lumbar region: Secondary | ICD-10-CM | POA: Diagnosis not present

## 2017-10-28 DIAGNOSIS — D6489 Other specified anemias: Secondary | ICD-10-CM | POA: Diagnosis not present

## 2017-10-28 DIAGNOSIS — E785 Hyperlipidemia, unspecified: Secondary | ICD-10-CM | POA: Diagnosis not present

## 2017-10-28 MED ORDER — MAGNESIUM 500 MG PO CAPS: 500.0000 mg | ORAL_CAPSULE | Freq: Two times a day (BID) | ORAL | 5 refills | Status: AC

## 2017-10-28 NOTE — Telephone Encounter (Signed)
Called Pharmacy and told them that patient can get OTC and she stated that since it was filled by pre package that they need a prescription for this. This medication was last written by Dr Dossie Arbour 04/18/17. Can you write prescription and I will call pharmacy back and notify.

## 2017-10-28 NOTE — Telephone Encounter (Signed)
Called number listed to inform them that Prescription was sent to her main Pharmacy by Cking,NP

## 2017-10-28 NOTE — Telephone Encounter (Signed)
sent 

## 2017-11-02 DIAGNOSIS — M5136 Other intervertebral disc degeneration, lumbar region: Secondary | ICD-10-CM | POA: Diagnosis not present

## 2017-11-02 DIAGNOSIS — M48061 Spinal stenosis, lumbar region without neurogenic claudication: Secondary | ICD-10-CM | POA: Diagnosis not present

## 2017-11-02 DIAGNOSIS — M4310 Spondylolisthesis, site unspecified: Secondary | ICD-10-CM | POA: Diagnosis not present

## 2017-11-04 ENCOUNTER — Ambulatory Visit: Payer: Medicare HMO | Admitting: Cardiovascular Disease

## 2017-11-04 ENCOUNTER — Ambulatory Visit: Payer: Medicare HMO | Admitting: Nurse Practitioner

## 2017-11-07 ENCOUNTER — Other Ambulatory Visit: Payer: Self-pay | Admitting: Family Medicine

## 2017-11-07 DIAGNOSIS — E875 Hyperkalemia: Secondary | ICD-10-CM

## 2017-11-13 ENCOUNTER — Ambulatory Visit: Payer: Medicare HMO | Admitting: Family Medicine

## 2017-11-25 DIAGNOSIS — Z79899 Other long term (current) drug therapy: Secondary | ICD-10-CM | POA: Diagnosis not present

## 2017-11-28 DIAGNOSIS — Z79899 Other long term (current) drug therapy: Secondary | ICD-10-CM | POA: Diagnosis not present

## 2017-12-10 ENCOUNTER — Other Ambulatory Visit: Payer: Self-pay | Admitting: Family Medicine

## 2017-12-10 DIAGNOSIS — F4310 Post-traumatic stress disorder, unspecified: Secondary | ICD-10-CM

## 2017-12-10 DIAGNOSIS — F4321 Adjustment disorder with depressed mood: Secondary | ICD-10-CM

## 2017-12-10 DIAGNOSIS — F4381 Prolonged grief disorder: Secondary | ICD-10-CM

## 2017-12-10 DIAGNOSIS — F4329 Adjustment disorder with other symptoms: Secondary | ICD-10-CM

## 2017-12-11 MED ORDER — ARIPIPRAZOLE 5 MG PO TABS: 5.0000 mg | ORAL_TABLET | Freq: Every day | ORAL | 0 refills | Status: DC

## 2017-12-11 NOTE — Addendum Note (Signed)
Addended by: Cleaster Corin on: 12/11/2017 07:51 AM   Modules accepted: Orders

## 2017-12-12 ENCOUNTER — Other Ambulatory Visit: Payer: Self-pay | Admitting: Family Medicine

## 2017-12-12 ENCOUNTER — Ambulatory Visit (INDEPENDENT_AMBULATORY_CARE_PROVIDER_SITE_OTHER): Payer: Medicare HMO | Admitting: Family Medicine

## 2017-12-12 ENCOUNTER — Encounter: Payer: Self-pay | Admitting: Family Medicine

## 2017-12-12 VITALS — BP 125/65 | HR 60 | Temp 98.1°F | Ht 62.0 in | Wt 186.0 lb

## 2017-12-12 DIAGNOSIS — E782 Mixed hyperlipidemia: Secondary | ICD-10-CM

## 2017-12-12 DIAGNOSIS — F4329 Adjustment disorder with other symptoms: Secondary | ICD-10-CM

## 2017-12-12 DIAGNOSIS — F4321 Adjustment disorder with depressed mood: Secondary | ICD-10-CM | POA: Diagnosis not present

## 2017-12-12 DIAGNOSIS — G894 Chronic pain syndrome: Secondary | ICD-10-CM

## 2017-12-12 DIAGNOSIS — K219 Gastro-esophageal reflux disease without esophagitis: Secondary | ICD-10-CM

## 2017-12-12 DIAGNOSIS — F4310 Post-traumatic stress disorder, unspecified: Secondary | ICD-10-CM

## 2017-12-12 DIAGNOSIS — J3089 Other allergic rhinitis: Secondary | ICD-10-CM

## 2017-12-12 DIAGNOSIS — F3341 Major depressive disorder, recurrent, in partial remission: Secondary | ICD-10-CM

## 2017-12-12 DIAGNOSIS — F4381 Prolonged grief disorder: Secondary | ICD-10-CM

## 2017-12-12 DIAGNOSIS — K909 Intestinal malabsorption, unspecified: Secondary | ICD-10-CM

## 2017-12-12 DIAGNOSIS — M792 Neuralgia and neuritis, unspecified: Secondary | ICD-10-CM

## 2017-12-12 DIAGNOSIS — Z79899 Other long term (current) drug therapy: Secondary | ICD-10-CM

## 2017-12-12 DIAGNOSIS — M48062 Spinal stenosis, lumbar region with neurogenic claudication: Secondary | ICD-10-CM

## 2017-12-12 DIAGNOSIS — R69 Illness, unspecified: Secondary | ICD-10-CM | POA: Diagnosis not present

## 2017-12-12 MED ORDER — OMEPRAZOLE 40 MG PO CPDR: 40.0000 mg | DELAYED_RELEASE_CAPSULE | Freq: Every day | ORAL | 3 refills | Status: DC

## 2017-12-12 MED ORDER — ARIPIPRAZOLE 5 MG PO TABS: 5.0000 mg | ORAL_TABLET | Freq: Every day | ORAL | 1 refills | Status: DC

## 2017-12-12 MED ORDER — SERTRALINE HCL 100 MG PO TABS: 100.0000 mg | ORAL_TABLET | Freq: Two times a day (BID) | ORAL | 1 refills | Status: DC

## 2017-12-12 MED ORDER — FLUTICASONE PROPIONATE 50 MCG/ACT NA SUSP: 2.0000 | Freq: Every day | NASAL | 3 refills | Status: DC

## 2017-12-12 MED ORDER — LORAZEPAM 0.5 MG PO TABS: 0.5000 mg | ORAL_TABLET | Freq: Two times a day (BID) | ORAL | 2 refills | Status: DC | PRN

## 2017-12-12 NOTE — Patient Instructions (Addendum)
Thank you for coming to the office today.  Medications to be refilled now.  DUE for FASTING BLOOD WORK (no food or drink after midnight before the lab appointment, only water or coffee without cream/sugar on the morning of)  SCHEDULE "Lab Only" visit in the morning at the clinic for lab draw in 3 MONTHS   - Make sure Lab Only appointment is at about 1 week before your next appointment, so that results will be available  For Lab Results, once available within 2-3 days of blood draw, you can can log in to MyChart online to view your results and a brief explanation. Also, we can discuss results at next follow-up visit.   Please schedule a Follow-up Appointment to: Return in about 3 months (around 03/13/2018) for Mood/Anxiety / lab results.  If you have any other questions or concerns, please feel free to call the office or send a message through [facility]. You may also schedule an earlier appointment if necessary.  Additionally, you may be receiving a survey about your experience at our office within a few days to 1 week by e-mail or mail. We value your feedback.  Nobie Putnam, DO [place]

## 2017-12-12 NOTE — Assessment & Plan Note (Signed)
Controlled cholesterol on statin and lifestyle - advised that she should continue therapy - this is preventative Last lipid panel 07/2017 Prior hollenhorst plaque, followed by Cardiology  Plan: 1. Continue current meds - Rosuvastatin 40mg  2. Continue ASA 81mg  for primary ASCVD risk reduction 3. Encourage improved lifestyle - low carb/cholesterol, reduce portion size, continue improving regular exercise  Re-check labs in 3 months

## 2017-12-12 NOTE — Progress Notes (Signed)
Subjective:    Patient ID: Ashley Fields, female    DOB: 1955-12-16, 62 y.o.   MRN: [id number]  Ashley Fields is a 62 y.o. female presenting on 12/12/2017 for Depression and Anxiety   HPI   FOLLOW-UPAdjustment Disorder w/ Depressed mood/PTSD / Anxiety Last visits 07/2017 - 08/2017 for same problem, after passing of her sister this year, her symptoms have flared up , see note for background HPI, she has remained back on Abilify 5mg  daily, and continued on Sertraline, Trazodone. - Today reports her mood and anxiety are persistent overall, without significant change. She continues on medications. She is due for refill Lorapzem, ran out 1 week ago, she denies any withdrawal symptoms. She was asking about possibility of higher dose for Lorazepam up to 1mg  as she was having more problems with anxiety now seems gradually improving. - She has not seen Psychiatry. And remains not interested in this now. She is open to consider this but prefers to continue with current medications now. See PHQ below - Regarding sleep, still has insomnia but now doing better on Trazodone and Melatonin Still states improved reduced crying spells and mood lability  History of hollenhorst plaque / HLD Previously cholesterol elevated - now improved on statin therapy, followed by Cardiology Last lipid panel 07/2017 - showed normal range lipids. Well controlled Currently taking - Rosuvastatin 40mg  daily - tolerating well without myalgias Taking ASA 81 Today she is asking about repeat blood work, states cardiology did not want to take her off medication  Chronic Pain / History of Back Surgery History of back surgery from [facility] Dr Dimmig by her report. She is wearing back brace for support and using pain medications as prescribed. She states now transferred her pain management from [facility] over to [facility] and doing well. Requested record.  Health Maintenance: UTD Flu Vaccine 09/12/17  Depression screen [facility] 2/9  12/12/2017 09/12/2017 08/27/2017  Decreased Interest 1 0 0  Down, Depressed, Hopeless 0 1 0  PHQ - 2 Score 1 1 0  Altered sleeping 1 - -  Tired, decreased energy 0 - -  Change in appetite 1 - -  Feeling bad or failure about yourself  0 - -  Trouble concentrating 0 - -  Moving slowly or fidgety/restless 0 - -  Suicidal thoughts 0 - -  PHQ-9 Score 3 - -  Difficult doing work/chores Somewhat difficult - -  Some recent data might be hidden   GAD 7 : Generalized Anxiety Score 12/12/2017 08/08/2017 07/05/2017 06/10/2017  Nervous, Anxious, on Edge 1 0 3 3  Control/stop worrying 1 0 1 2  Worry too much - different things 1 0 1 2  Trouble relaxing 1 1 1 2   Restless 0 1 1 1   Easily annoyed or irritable 1 1 2 2   Afraid - awful might happen 1 0 0 0  Total GAD 7 Score 6 3 9 12   Anxiety Difficulty Somewhat difficult Not difficult at all Somewhat difficult Somewhat difficult    Social History   Tobacco Use  . Smoking status: Former Smoker    Years: 4.00  . Smokeless tobacco: Never Used  Substance Use Topics  . Alcohol use: No  . Drug use: No    Review of Systems Per HPI unless specifically indicated above     Objective:    BP 125/65 (BP Location: Left Arm, Patient Position: Sitting, Cuff Size: Normal)   Pulse 60   Temp 98.1 F (36.7 C)   Ht 5'  2" (1.575 m)   Wt 186 lb (84.4 kg)   BMI 34.02 kg/m   Wt Readings from Last 3 Encounters:  12/12/17 186 lb (84.4 kg)  10/01/17 178 lb 8 oz (81 kg)  09/12/17 175 lb (79.4 kg)    Physical Exam  Constitutional: She is oriented to person, place, and time. She appears well-developed and well-nourished. No distress.  Well-appearing, comfortable, cooperative  HENT:  Head: Normocephalic and atraumatic.  Mouth/Throat: Oropharynx is clear and moist.  Eyes: Conjunctivae are normal. Right eye exhibits no discharge. Left eye exhibits no discharge.  Neck: Normal range of motion.  Cardiovascular: Normal rate, regular rhythm, normal heart sounds and  intact distal pulses.  No murmur heard. Pulmonary/Chest: Effort normal and breath sounds normal. No respiratory distress. She has no wheezes. She has no rales.  Musculoskeletal: She exhibits no edema.  Wearing back brace  Neurological: She is alert and oriented to person, place, and time.  Skin: Skin is warm and dry. No rash noted. She is not diaphoretic. No erythema.  Psychiatric: She has a normal mood and affect. Her behavior is normal.  Well groomed, good eye contact, normal speech and thoughts. Good insight into mental health. Not anxious appearing.  Nursing note and vitals reviewed.  Results for orders placed or performed in visit on 86/76/72  BASIC METABOLIC PANEL WITH GFR  Result Value Ref Range   Glucose, Bld 74 65 - 99 mg/dL   BUN 14 7 - 25 mg/dL   Creat 0.95 0.50 - 0.99 mg/dL   GFR, Est Non African American 65 > OR = 60 mL/min/1.54m2   GFR, Est African American 75 > OR = 60 mL/min/1.36m2   BUN/Creatinine Ratio NOT APPLICABLE 6 - 22 (calc)   Sodium 141 135 - 146 mmol/L   Potassium 4.9 3.5 - 5.3 mmol/L   Chloride 106 98 - 110 mmol/L   CO2 30 20 - 32 mmol/L   Calcium 8.9 8.6 - 10.4 mg/dL      Assessment & Plan:   Problem List Items Addressed This Visit    Chronic pain syndrome (Chronic)    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 Continue current meds - Rosuvastatin 40mg  2. Continue ASA 81mg  for primary ASCVD risk reduction 3. Encourage improved lifestyle - low  carb/cholesterol, reduce portion size, continue improving regular exercise  Re-check labs in 3 months      PTSD (post-traumatic stress disorder) - Primary    Stable chronic history PTSD w/ anxiety - recent worse in past 6 months with passing of sister, gradually improving On chronic BDZ Continue Zoloft Agree to refill Lorazepam, agree to increase to 60 pills for 0.5 BID dosing now - she ran out early - #60 now not 45, and +2 refills, ultimately if can reduce dose this is goal - keep re-evaluating  Also Future consider switch zoloft to cymbalta if need      Relevant Medications   ARIPiprazole (ABILIFY) 5 MG tablet   LORazepam (ATIVAN) 0.5 MG tablet   sertraline (ZOLOFT) 100 MG tablet  Other Visit Diagnoses    Prolonged grief reaction       Relevant Medications   ARIPiprazole (ABILIFY) 5 MG tablet   Acute adjustment disorder with depressed mood       Relevant Medications   ARIPiprazole (ABILIFY) 5 MG tablet   Recurrent major depressive disorder, in partial remission (HCC)       Relevant Medications   LORazepam (ATIVAN) 0.5 MG tablet   sertraline (ZOLOFT) 100 MG tablet      Meds ordered this encounter  Medications  . ARIPiprazole (ABILIFY) 5 MG tablet    Sig: Take 1 tablet (5 mg total) by mouth daily.    Dispense:  90 tablet    Refill:  1    90 day supply  . LORazepam (ATIVAN) 0.5 MG tablet    Sig: Take 1 tablet (0.5 mg total) by mouth 2 (two) times daily as needed for anxiety.    Dispense:  60 tablet    Refill:  2  . sertraline (ZOLOFT) 100 MG tablet    Sig: Take 1 tablet (100 mg total) by mouth 2 (two) times daily.    Dispense:  180 tablet    Refill:  1    Follow up plan: Return in about 3 months (around 03/13/2018) for Mood/Anxiety / lab results.  Future labs ordered for 6 months - 03/2018 - patient to be notified to schedule fasting lab visit.  Nobie Putnam, DO [facility] 12/12/2017, 10:30 AM

## 2017-12-12 NOTE — Assessment & Plan Note (Signed)
Stable chronic history PTSD w/ anxiety - recent worse in past 6 months with passing of sister, gradually improving On chronic BDZ Continue Zoloft Agree to refill Lorazepam, agree to increase to 60 pills for 0.5 BID dosing now - she ran out early - #60 now not 45, and +2 refills, ultimately if can reduce dose this is goal - keep re-evaluating  Also Future consider switch zoloft to cymbalta if need

## 2017-12-12 NOTE — Assessment & Plan Note (Addendum)
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

## 2017-12-13 DIAGNOSIS — Z79899 Other long term (current) drug therapy: Secondary | ICD-10-CM | POA: Diagnosis not present

## 2017-12-18 DIAGNOSIS — M545 Low back pain: Secondary | ICD-10-CM | POA: Diagnosis not present

## 2017-12-18 DIAGNOSIS — G894 Chronic pain syndrome: Secondary | ICD-10-CM | POA: Diagnosis not present

## 2017-12-30 ENCOUNTER — Telehealth: Payer: Self-pay

## 2017-12-30 NOTE — Telephone Encounter (Signed)
Called patient and suggested that we don't prescribe antibiotics over the phone needed to be evaluated by scheduling office visit. unfortunatley we don't have any opening but you can do revisit or go to urgent care and she is also aware of Dr. Raliegh Ip not being in office till Friday.

## 2017-12-31 ENCOUNTER — Telehealth: Payer: Medicare HMO | Admitting: Physician Assistant

## 2017-12-31 DIAGNOSIS — R69 Illness, unspecified: Secondary | ICD-10-CM

## 2017-12-31 DIAGNOSIS — J111 Influenza due to unidentified influenza virus with other respiratory manifestations: Secondary | ICD-10-CM

## 2017-12-31 NOTE — Progress Notes (Signed)
E visit for Flu like symptoms   We are sorry that you are not feeling well.  Here is how we plan to help! Based on what you have shared with me it looks like you may have a respiratory virus that may be influenza.  Influenza or "the flu" is   an infection caused by a respiratory virus. The flu virus is highly contagious and persons who did not receive their yearly flu vaccination may "catch" the flu from close contact.  We have anti-viral medications to treat the viruses that cause this infection. They are not a "cure" and only shorten the course of the infection. These prescriptions are most effective when they are given within the first 2 days of "flu" symptoms. Antiviral medication are indicated if you have a high risk of complications from the flu. You should also consider an antiviral medication if you are in close contact with someone who is at risk. These medications can help patients avoid complications from the flu  but have side effects that you should know. Possible side effects from Tamiflu or oseltamivir include nausea, vomiting, diarrhea, dizziness, headaches, eye redness, sleep problems or other respiratory symptoms. You should not take Tamiflu if you have an allergy to oseltamivir or any to the ingredients in Tamiflu.  Based upon your symptoms and potential risk factors I recommend that you follow the flu symptoms recommendation that I have listed below. Given you are apparently healthy and your symptoms have been present for more than three days tamiflu would be of little to no benefit.  OTC antipyretics and lots of rest are best.    ANYONE WHO HAS FLU SYMPTOMS SHOULD: Stay home. The flu is highly contagious and going out or to work exposes others!  Be sure to drink plenty of fluids. Water is fine as well as fruit juices, sodas and electrolyte beverages. You may want to stay away from caffeine or alcohol. If you are nauseated, try taking small sips of liquids. How do you know if you  are getting enough fluid? Your urine should be a pale yellow or almost colorless. Get rest. Taking a steamy shower or using a humidifier may help nasal congestion and ease sore throat pain. Using a saline nasal spray works much the same way. Cough drops, hard candies and sore throat lozenges may ease your cough. Line up a caregiver. Have someone check on you regularly.   GET HELP RIGHT AWAY IF: You cannot keep down liquids or your medications. You become short of breath Your fell like you are going to pass out or loose consciousness. Your symptoms persist after you have completed your treatment plan MAKE SURE YOU  Understand these instructions. Will watch your condition. Will get help right away if you are not doing well or get worse.  Your e-visit answers were reviewed by a board certified advanced clinical practitioner to complete your personal care plan.  Depending on the condition, your plan could have included both over the counter or prescription medications.  If there is a problem please reply once you have received a response from your provider.  Your safety is important to [place].  If you have drug allergies check your prescription carefully.    You can use MyChart to ask questions about today's visit, request a non-urgent call back, or ask for a work or school excuse for 24 hours related to this e-Visit. If it has been greater than 24 hours you will need to follow up with your provider, or  enter a new e-Visit to address those concerns.  You will get an e-mail in the next two days asking about your experience.  I hope that your e-visit has been valuable and will speed your recovery. Thank you for using e-visits.   ===View-only below this line===   ----- Message -----    From: Ashley Fields    Sent: 12/31/2017 10:22 AM EST      To: E-Visit Mailing List Subject: E-Visit Submission: Flu Like Symptoms  E-Visit Submission: Flu Like  Symptoms --------------------------------  Question: How long have you had flu like symptoms? Answer:   over two days  Question: Do you have a cough or sore throat? Answer:   I have both a cough and a sore throat  Question: Are you in close contact with anyone who has similar symptoms ? Answer:   Yes  Question: Do you have a fever? Answer:   Yes, I have a low fever (lower than 101 degrees)  Question: Are you short of breath? Answer:   No  Question: Do you have constant pain in your chest or abdomen? Answer:   Yes  Question: Are you able to keep liquids down? Answer:   Yes  Question: Have you experienced a seizure or loss of consciousness? Answer:   No  Question: Do you have a headache? Answer:   Yes  Question: Is there a rash? Answer:   No  Question: Are you over the age of 78? Answer:   No  Question: Are you treated for any of the following conditions: Asthma, COPD, diabetes, renal failure on dialysis, AIDS, any neuromuscular disease that effects the clearing of secretions heart failure or heart disease? Answer:   No  Question: Describe your sore throat: Answer:   Scratchy, raw feeling down into my lungs, coughing hurts my throat really bad  Question: How long have you had a sore throat? Answer:   3 days  Question: Do you have any tenderness or swelling in your neck? Answer:   Yes  Question: Have you received recent chemotherapy or are you taking a drug that may reduce your ability to fight infections for psoriasis, arthritis, or any other autoimmune condition? Answer:   No  Question: Do you have close contact with anyone who has been diagnosed with any of the conditions listed above? Answer:   No  Question: Are your symptoms severe enough that you think or someone has told you that you need to see a physician urgently? Answer:   No  Question: Are you allergic to Zanamivir or Oseltamivir (Tamiflu)? Answer:   No  Question: Are there children in your family  or household that are under the age of 48? Answer:   No  Question: Please list your medication allergies that you may have ? (If 'none' , please list as 'none') Answer:   Flagyl  Question: Please list any additional comments  Answer:

## 2018-01-10 ENCOUNTER — Encounter: Payer: Self-pay | Admitting: Family Medicine

## 2018-01-10 ENCOUNTER — Ambulatory Visit (INDEPENDENT_AMBULATORY_CARE_PROVIDER_SITE_OTHER): Payer: Medicare HMO | Admitting: Family Medicine

## 2018-01-10 VITALS — BP 121/58 | HR 69 | Temp 98.6°F | Resp 16 | Ht 62.0 in | Wt 193.0 lb

## 2018-01-10 DIAGNOSIS — J0100 Acute maxillary sinusitis, unspecified: Secondary | ICD-10-CM | POA: Diagnosis not present

## 2018-01-10 DIAGNOSIS — J9801 Acute bronchospasm: Secondary | ICD-10-CM | POA: Diagnosis not present

## 2018-01-10 MED ORDER — PROAIR RESPICLICK 108 (90 BASE) MCG/ACT IN AEPB: 2.0000 | INHALATION_SPRAY | RESPIRATORY_TRACT | 0 refills | Status: DC | PRN

## 2018-01-10 MED ORDER — ALBUTEROL SULFATE HFA 108 (90 BASE) MCG/ACT IN AERS: 2.0000 | INHALATION_SPRAY | RESPIRATORY_TRACT | 0 refills | Status: DC | PRN

## 2018-01-10 MED ORDER — AMOXICILLIN-POT CLAVULANATE 875-125 MG PO TABS: 1.0000 | ORAL_TABLET | Freq: Two times a day (BID) | ORAL | 0 refills | Status: DC

## 2018-01-10 NOTE — Progress Notes (Signed)
Subjective:    Patient ID: Ashley Fields, female    DOB: 25-Mar-1955, 63 y.o.   MRN: [id number]  Ashley Fields is a 63 y.o. female presenting on 01/10/2018 for Cough (exhaustion, her brother died and went to [place] had sick grandchildrens onset 10 days HA, ear pain, sinusitis, chills, nausea, abdominal pain, wheezing at night)   HPI   SINUSITIS / Cough w/ Bronchospasm Reports symptoms started about 10 days ago with sinus congestion and drainage, worsening into lower chest congestion, admits some coughing productive with "brown" sputum". Still has Left sided sinus pain and pressure. - Sick contacts with grandchildren. - Recently she did E Visit as well, not treatment from that she was told possibly flu vs viral. - Tried warm tea for throat, dayquil, nyquil, alkaseltzer cold/flu, sudafed - Admits bilateral ear pain, and itchy - Admits sore throat and lungs feel "raw" with some discomfort with coughing - Admits episodes of nausea, rare vomiting episode - Admits some temperature instability and chills, did not measure fever - Admits some body aches  Health Maintenance: UTD Flu Vaccine 09/12/17  Depression screen [facility] 2/9 12/12/2017 09/12/2017 08/27/2017  Decreased Interest 1 0 0  Down, Depressed, Hopeless 0 1 0  PHQ - 2 Score 1 1 0  Altered sleeping 1 - -  Tired, decreased energy 0 - -  Change in appetite 1 - -  Feeling bad or failure about yourself  0 - -  Trouble concentrating 0 - -  Moving slowly or fidgety/restless 0 - -  Suicidal thoughts 0 - -  PHQ-9 Score 3 - -  Difficult doing work/chores Somewhat difficult - -  Some recent data might be hidden    Social History   Tobacco Use  . Smoking status: Former Smoker    Years: 4.00  . Smokeless tobacco: Former Network engineer Use Topics  . Alcohol use: No  . Drug use: No    Review of Systems Per HPI unless specifically indicated above     Objective:    BP (!) 121/58   Pulse 69   Temp 98.6 F (37 C) (Oral)   Resp 16   Ht  5\' 2"  (1.575 m)   Wt 193 lb (87.5 kg)   SpO2 99%   BMI 35.30 kg/m   Wt Readings from Last 3 Encounters:  01/10/18 193 lb (87.5 kg)  12/12/17 186 lb (84.4 kg)  10/01/17 178 lb 8 oz (81 kg)    Physical Exam Vitals signs and nursing note reviewed.  Constitutional:      General: She is not in acute distress.    Appearance: She is well-developed. She is not diaphoretic.     Comments: Tired appearing, comfortable, cooperative  HENT:     Head: Normocephalic and atraumatic.     Comments: RIGHT maxillary sinuses mild -tender. Nares patent without purulence or edema. R TM with some clear effusion with slight fullness vs bulging without erythema, L TM clear without erythema, effusion or bulging.  Oropharynx non specific with generalized post nasal drainage and erythema without exudates or asymmetry. Eyes:     General:        Right eye: No discharge.        Left eye: No discharge.     Conjunctiva/sclera: Conjunctivae normal.  Neck:     Musculoskeletal: Normal range of motion and neck supple.     Thyroid: No thyromegaly.  Cardiovascular:     Rate and Rhythm: Normal rate and regular rhythm.  Heart sounds: Normal heart sounds. No murmur.  Pulmonary:     Effort: Pulmonary effort is normal. No respiratory distress.     Breath sounds: Normal breath sounds. No wheezing or rales.     Comments: Scattered expiratory coarse breath sound vs wheezing, non focal, no crackles. Good air movement, speaks full sentences. Occasional cough. Musculoskeletal: Normal range of motion.  Lymphadenopathy:     Cervical: No cervical adenopathy.  Skin:    General: Skin is warm and dry.     Findings: No erythema or rash.  Neurological:     Mental Status: She is alert and oriented to person, place, and time.  Psychiatric:        Behavior: Behavior normal.     Comments: Well groomed, good eye contact, normal speech and thoughts    Results for orders placed or performed in visit on 78/93/81  BASIC METABOLIC  PANEL WITH GFR  Result Value Ref Range   Glucose, Bld 74 65 - 99 mg/dL   BUN 14 7 - 25 mg/dL   Creat 0.95 0.50 - 0.99 mg/dL   GFR, Est Non African American 65 > OR = 60 mL/min/1.11m2   GFR, Est African American 75 > OR = 60 mL/min/1.50m2   BUN/Creatinine Ratio NOT APPLICABLE 6 - 22 (calc)   Sodium 141 135 - 146 mmol/L   Potassium 4.9 3.5 - 5.3 mmol/L   Chloride 106 98 - 110 mmol/L   CO2 30 20 - 32 mmol/L   Calcium 8.9 8.6 - 10.4 mg/dL      Assessment & Plan:   Problem List Items Addressed This Visit    None    Visit Diagnoses    Acute non-recurrent maxillary sinusitis    -  Primary   Relevant Medications   amoxicillin-clavulanate (AUGMENTIN) 875-125 MG tablet   Bronchospasm, acute       Relevant Medications   albuterol (PROVENTIL HFA;VENTOLIN HFA) 108 (90 Base) MCG/ACT inhaler   PROAIR RESPICLICK 017 (90 Base) MCG/ACT AEPB      Consistent with acute maxillary sinusitis, likely initially viral URI with worsening concern for bacterial infection, now duration >10 days and sick contacts, seems to have 2nd sickening with sinus pain and lower respiratory symptoms progressing now with some bronchospasm coughing.  Plan: 1. Start Augmentin 875-125mg  PO BID x 10 days 2. Continue Flonase 3. Add Albuterol inhaler, discussed proper use, sent rx generic to pharmacy and printed brand name inhaler incase needs this for coverage 4. Supportive care OTC medications 5. Return criteria reviewed - within 1 week if needed consider chest x-ray or additional therapy if wheezing/cough/dyspnea - consider prednisone   Meds ordered this encounter  Medications  . amoxicillin-clavulanate (AUGMENTIN) 875-125 MG tablet    Sig: Take 1 tablet by mouth 2 (two) times daily. For 10 days    Dispense:  20 tablet    Refill:  0  . albuterol (PROVENTIL HFA;VENTOLIN HFA) 108 (90 Base) MCG/ACT inhaler    Sig: Inhale 2 puffs into the lungs every 4 (four) hours as needed for wheezing or shortness of breath (cough).      Dispense:  1 Inhaler    Refill:  0  . PROAIR RESPICLICK 510 (90 Base) MCG/ACT AEPB    Sig: Inhale 2 puffs into the lungs every 4 (four) hours as needed.    Dispense:  1 each    Refill:  0    Follow up plan: Return in about 1 week (around 01/17/2018), or if symptoms worsen or fail  to improve, for sinusitis.   Nobie Putnam, DO [facility] 01/10/2018, 2:41 PM

## 2018-01-10 NOTE — Patient Instructions (Addendum)
Thank you for coming to the office today.   1. It sounds like you have a Sinusitis (Bacterial Infection) - this most likely started as an Upper Respiratory Virus that has settled into an infection.  - Start Augmentin 1 pill twice daily (breakfast and dinner, with food and plenty of water) for 10 days, complete entire course, do not stop early even if feeling better - Continue Flonase  Use albuterol inhaler for cough - as needed - Recommend to may try using Nasal Saline spray multiple times a day to help flush out congestion and clear sinuses - Improve hydration by drinking plenty of clear fluids (water, gatorade) to reduce secretions and thin congestion - Congestion draining down throat can cause irritation. May try warm herbal tea with honey, cough drops - Can take Tylenol or Ibuprofen as needed for fevers - May continue over the counter cold medicine as you are, I would not use any decongestant or mucinex longer than 7 days.  If you develop persistent fever >101F for at least 3 consecutive days, headaches with sinus pain or pressure or persistent earache, please schedule a follow-up evaluation within next few days to week.   Please schedule a Follow-up Appointment to: Return in about 1 week (around 01/17/2018), or if symptoms worsen or fail to improve, for sinusitis.  If you have any other questions or concerns, please feel free to call the office or send a message through [place]. You may also schedule an earlier appointment if necessary.  Additionally, you may be receiving a survey about your experience at our office within a few days to 1 week by e-mail or mail. We value your feedback.  Nobie Putnam, DO [place]

## 2018-01-15 ENCOUNTER — Other Ambulatory Visit: Payer: Self-pay | Admitting: Nurse Practitioner

## 2018-01-15 DIAGNOSIS — M792 Neuralgia and neuritis, unspecified: Secondary | ICD-10-CM

## 2018-01-15 DIAGNOSIS — G8929 Other chronic pain: Secondary | ICD-10-CM

## 2018-01-15 DIAGNOSIS — G894 Chronic pain syndrome: Secondary | ICD-10-CM

## 2018-01-15 DIAGNOSIS — M5442 Lumbago with sciatica, left side: Secondary | ICD-10-CM

## 2018-01-15 DIAGNOSIS — M5441 Lumbago with sciatica, right side: Secondary | ICD-10-CM

## 2018-01-15 MED ORDER — ETODOLAC 500 MG PO TABS: 500.0000 mg | ORAL_TABLET | Freq: Two times a day (BID) | ORAL | 1 refills | Status: DC

## 2018-01-15 MED ORDER — GABAPENTIN 800 MG PO TABS: 800.0000 mg | ORAL_TABLET | Freq: Three times a day (TID) | ORAL | 1 refills | Status: DC

## 2018-01-16 DIAGNOSIS — M545 Low back pain: Secondary | ICD-10-CM | POA: Diagnosis not present

## 2018-01-20 ENCOUNTER — Other Ambulatory Visit: Payer: Self-pay | Admitting: Family Medicine

## 2018-01-20 DIAGNOSIS — Z90710 Acquired absence of both cervix and uterus: Secondary | ICD-10-CM

## 2018-01-21 DIAGNOSIS — J9801 Acute bronchospasm: Secondary | ICD-10-CM

## 2018-01-21 NOTE — Telephone Encounter (Signed)
Please advise 

## 2018-01-22 MED ORDER — PREDNISONE 50 MG PO TABS: 50.0000 mg | ORAL_TABLET | Freq: Every day | ORAL | 0 refills | Status: DC

## 2018-01-22 NOTE — Addendum Note (Signed)
Addended by: Olin Hauser on: 01/22/2018 03:26 PM   Modules accepted: Orders

## 2018-01-28 DIAGNOSIS — H04123 Dry eye syndrome of bilateral lacrimal glands: Secondary | ICD-10-CM | POA: Diagnosis not present

## 2018-01-28 DIAGNOSIS — H43813 Vitreous degeneration, bilateral: Secondary | ICD-10-CM | POA: Diagnosis not present

## 2018-01-28 DIAGNOSIS — H26493 Other secondary cataract, bilateral: Secondary | ICD-10-CM | POA: Diagnosis not present

## 2018-01-29 DIAGNOSIS — G894 Chronic pain syndrome: Secondary | ICD-10-CM | POA: Diagnosis not present

## 2018-01-29 DIAGNOSIS — M545 Low back pain: Secondary | ICD-10-CM | POA: Diagnosis not present

## 2018-02-13 ENCOUNTER — Other Ambulatory Visit: Payer: Self-pay | Admitting: Family Medicine

## 2018-02-13 DIAGNOSIS — F4310 Post-traumatic stress disorder, unspecified: Secondary | ICD-10-CM

## 2018-02-13 DIAGNOSIS — F5104 Psychophysiologic insomnia: Secondary | ICD-10-CM

## 2018-02-13 DIAGNOSIS — Z90710 Acquired absence of both cervix and uterus: Secondary | ICD-10-CM

## 2018-02-14 MED ORDER — TRAZODONE HCL 150 MG PO TABS: 150.0000 mg | ORAL_TABLET | Freq: Every day | ORAL | 1 refills | Status: DC

## 2018-02-18 ENCOUNTER — Telehealth: Payer: Self-pay | Admitting: Family Medicine

## 2018-02-18 NOTE — Telephone Encounter (Signed)
Can you check status of this with Lexine Baton later today or later this week?  Thanks. She was referred to Dr Weber Cooks [facility]  Nobie Putnam, [facility] 02/18/2018, 3:22 PM

## 2018-02-18 NOTE — Telephone Encounter (Signed)
I checked on status from my view of the order, and it still says NEW. I do not see any notes or updates. I am not sure if it is being processed.  Even if she cannot see the referral - perhaps Lexine Baton can still reach out to [facility] for [place] to help coordinate this.  --------------------  Two options:  1. Could you notify patient to call [facility] to check status of the referral?  2. Probably best answer is to notify Lexine Baton to call [facility] to make sure they have all of the correct information - if they are missing anything, then they may not process the referral.  Nobie Putnam, [facility] 02/18/2018, 5:09 PM

## 2018-02-18 NOTE — Telephone Encounter (Signed)
Pt called to check status of referral to psych [phone]

## 2018-02-19 NOTE — Telephone Encounter (Signed)
The patient from 12/19 has still waiting to be schedule so adviced patient to expect wait period.

## 2018-03-10 ENCOUNTER — Ambulatory Visit: Payer: Medicare HMO

## 2018-03-10 DIAGNOSIS — E782 Mixed hyperlipidemia: Secondary | ICD-10-CM

## 2018-03-10 DIAGNOSIS — Z79899 Other long term (current) drug therapy: Secondary | ICD-10-CM

## 2018-03-10 DIAGNOSIS — F4310 Post-traumatic stress disorder, unspecified: Secondary | ICD-10-CM

## 2018-03-10 DIAGNOSIS — R69 Illness, unspecified: Secondary | ICD-10-CM | POA: Diagnosis not present

## 2018-03-10 DIAGNOSIS — K909 Intestinal malabsorption, unspecified: Secondary | ICD-10-CM | POA: Diagnosis not present

## 2018-03-11 LAB — CBC WITH DIFFERENTIAL/PLATELET
Absolute Monocytes: 340 cells/uL (ref 200–950)
Basophils Absolute: 18 cells/uL (ref 0–200)
Basophils Relative: 0.4 %
Eosinophils Absolute: 129 cells/uL (ref 15–500)
Eosinophils Relative: 2.8 %
HCT: 37.5 % (ref 35.0–45.0)
Hemoglobin: 12.2 g/dL (ref 11.7–15.5)
Lymphs Abs: 1030 cells/uL (ref 850–3900)
MCH: 26.3 pg — ABNORMAL LOW (ref 27.0–33.0)
MCHC: 32.5 g/dL (ref 32.0–36.0)
MCV: 80.8 fL (ref 80.0–100.0)
MPV: 10.6 fL (ref 7.5–12.5)
Monocytes Relative: 7.4 %
Neutro Abs: 3082 cells/uL (ref 1500–7800)
Neutrophils Relative %: 67 %
Platelets: 146 10*3/uL (ref 140–400)
RBC: 4.64 10*6/uL (ref 3.80–5.10)
RDW: 15.3 % — ABNORMAL HIGH (ref 11.0–15.0)
Total Lymphocyte: 22.4 %
WBC: 4.6 10*3/uL (ref 3.8–10.8)

## 2018-03-11 LAB — LIPID PANEL
Cholesterol: 133 mg/dL (ref <200)
HDL: 80 mg/dL (ref >=50)
LDL Cholesterol (Calc): 38 mg/dL (calc)
Non-HDL Cholesterol (Calc): 53 mg/dL (calc) (ref <130)
Total CHOL/HDL Ratio: 1.7 (calc) (ref <5.0)
Triglycerides: 72 mg/dL (ref <150)

## 2018-03-11 LAB — COMPLETE METABOLIC PANEL WITH GFR
AG Ratio: 2.1 (calc) (ref 1.0–2.5)
ALT: 21 U/L (ref 6–29)
AST: 28 U/L (ref 10–35)
Albumin: 4.6 g/dL (ref 3.6–5.1)
Alkaline phosphatase (APISO): 62 U/L (ref 37–153)
BUN: 17 mg/dL (ref 7–25)
CO2: 30 mmol/L (ref 20–32)
Calcium: 9.2 mg/dL (ref 8.6–10.4)
Chloride: 107 mmol/L (ref 98–110)
Creat: 0.93 mg/dL (ref 0.50–0.99)
GFR, Est African American: 77 mL/min/{1.73_m2} (ref >=60)
GFR, Est Non African American: 66 mL/min/{1.73_m2} (ref >=60)
Globulin: 2.2 g/dL (calc) (ref 1.9–3.7)
Glucose, Bld: 100 mg/dL — ABNORMAL HIGH (ref 65–99)
Potassium: 5.2 mmol/L (ref 3.5–5.3)
Sodium: 143 mmol/L (ref 135–146)
Total Bilirubin: 0.5 mg/dL (ref 0.2–1.2)
Total Protein: 6.8 g/dL (ref 6.1–8.1)

## 2018-03-13 ENCOUNTER — Other Ambulatory Visit: Payer: Self-pay | Admitting: Family Medicine

## 2018-03-13 ENCOUNTER — Other Ambulatory Visit: Payer: Self-pay

## 2018-03-13 ENCOUNTER — Encounter: Payer: Self-pay | Admitting: Family Medicine

## 2018-03-13 ENCOUNTER — Ambulatory Visit (INDEPENDENT_AMBULATORY_CARE_PROVIDER_SITE_OTHER): Payer: Medicare HMO | Admitting: Family Medicine

## 2018-03-13 VITALS — BP 126/62 | HR 79 | Temp 98.3°F | Resp 16 | Ht 62.0 in | Wt 193.0 lb

## 2018-03-13 DIAGNOSIS — G894 Chronic pain syndrome: Secondary | ICD-10-CM

## 2018-03-13 DIAGNOSIS — F4310 Post-traumatic stress disorder, unspecified: Secondary | ICD-10-CM

## 2018-03-13 DIAGNOSIS — Z0000 Encounter for general adult medical examination without abnormal findings: Secondary | ICD-10-CM

## 2018-03-13 DIAGNOSIS — R7309 Other abnormal glucose: Secondary | ICD-10-CM

## 2018-03-13 DIAGNOSIS — E782 Mixed hyperlipidemia: Secondary | ICD-10-CM

## 2018-03-13 DIAGNOSIS — Z9884 Bariatric surgery status: Secondary | ICD-10-CM

## 2018-03-13 DIAGNOSIS — E538 Deficiency of other specified B group vitamins: Secondary | ICD-10-CM

## 2018-03-13 DIAGNOSIS — R69 Illness, unspecified: Secondary | ICD-10-CM | POA: Diagnosis not present

## 2018-03-13 DIAGNOSIS — E559 Vitamin D deficiency, unspecified: Secondary | ICD-10-CM

## 2018-03-13 NOTE — Progress Notes (Signed)
Subjective:    Patient ID: Ashley Fields, female    DOB: 1955/05/28, 63 y.o.   MRN: [id number]  Loriann Bosserman is a 63 y.o. female presenting on 03/13/2018 for Hyperlipidemia   HPI   FOLLOW-UPAdjustment Disorder w/ Depressed mood/PTSD / Anxiety Last visit 12/2017 for same problem, see prior note for background. - Interval update - she was referred to Psychiatry at [facility] - now scheduled as new patient with Dr Shea Evans on 03/27/18 - She is doing well currently, no new concerns, continues on current meds Abilify 5mg  daily, and continued on Sertraline, Trazodone. See PHQ below - Regarding sleep, still has insomnia but now doing better on Trazodone and Melatonin Still states improved reduced crying spells and mood lability  History of hollenhorst plaque / Hyperlipidemia Previously cholesterol elevated- now improved on statin therapy, followed by Cardiology Last lipid panel 2020 - showed very well controlled normal range lipids Currently taking - Rosuvastatin 40mg  daily - tolerating well without myalgias Taking ASA 81  Depression screen [facility] 2/9 03/13/2018 12/12/2017 09/12/2017  Decreased Interest 1 1 0  Down, Depressed, Hopeless 0 0 1  PHQ - 2 Score 1 1 1   Altered sleeping 1 1 -  Tired, decreased energy 0 0 -  Change in appetite 1 1 -  Feeling bad or failure about yourself  0 0 -  Trouble concentrating 1 0 -  Moving slowly or fidgety/restless 0 0 -  Suicidal thoughts 0 0 -  PHQ-9 Score 4 3 -  Difficult doing work/chores Somewhat difficult Somewhat difficult -  Some recent data might be hidden    Social History   Tobacco Use  . Smoking status: Former Smoker    Years: 4.00  . Smokeless tobacco: Former Network engineer Use Topics  . Alcohol use: No  . Drug use: No    Review of Systems Per HPI unless specifically indicated above     Objective:    BP 126/62   Pulse 79   Temp 98.3 F (36.8 C) (Oral)   Resp 16   Ht 5\' 2"  (1.575 m)   Wt 193 lb (87.5 kg)   BMI 35.30 kg/m     Wt Readings from Last 3 Encounters:  03/13/18 193 lb (87.5 kg)  01/10/18 193 lb (87.5 kg)  12/12/17 186 lb (84.4 kg)    Physical Exam Vitals signs and nursing note reviewed.  Constitutional:      General: She is not in acute distress.    Appearance: She is well-developed. She is not diaphoretic.     Comments: Well-appearing, comfortable, cooperative  HENT:     Head: Normocephalic and atraumatic.  Eyes:     General:        Right eye: No discharge.        Left eye: No discharge.     Conjunctiva/sclera: Conjunctivae normal.  Cardiovascular:     Rate and Rhythm: Normal rate.  Pulmonary:     Effort: Pulmonary effort is normal.  Skin:    General: Skin is warm and dry.     Findings: No erythema or rash.  Neurological:     Mental Status: She is alert and oriented to person, place, and time.  Psychiatric:        Behavior: Behavior normal.     Comments: Well groomed, good eye contact, normal speech and thoughts, mood is improved. Does not appear anxious today    Results for orders placed or performed in visit on 03/10/18  Lipid panel  Result Value  Ref Range   Cholesterol 133 <200 mg/dL   HDL 80 > OR = 50 mg/dL   Triglycerides 72 <150 mg/dL   LDL Cholesterol (Calc) 38 mg/dL (calc)   Total CHOL/HDL Ratio 1.7 <5.0 (calc)   Non-HDL Cholesterol (Calc) 53 <130 mg/dL (calc)  CBC with Differential/Platelet  Result Value Ref Range   WBC 4.6 3.8 - 10.8 Thousand/uL   RBC 4.64 3.80 - 5.10 Million/uL   Hemoglobin 12.2 11.7 - 15.5 g/dL   HCT 37.5 35.0 - 45.0 %   MCV 80.8 80.0 - 100.0 fL   MCH 26.3 (L) 27.0 - 33.0 pg   MCHC 32.5 32.0 - 36.0 g/dL   RDW 15.3 (H) 11.0 - 15.0 %   Platelets 146 140 - 400 Thousand/uL   MPV 10.6 7.5 - 12.5 fL   Neutro Abs 3,082 1,500 - 7,800 cells/uL   Lymphs Abs 1,030 850 - 3,900 cells/uL   Absolute Monocytes 340 200 - 950 cells/uL   Eosinophils Absolute 129 15 - 500 cells/uL   Basophils Absolute 18 0 - 200 cells/uL   Neutrophils Relative % 67 %   Total  Lymphocyte 22.4 %   Monocytes Relative 7.4 %   Eosinophils Relative 2.8 %   Basophils Relative 0.4 %  COMPLETE METABOLIC PANEL WITH GFR  Result Value Ref Range   Glucose, Bld 100 (H) 65 - 99 mg/dL   BUN 17 7 - 25 mg/dL   Creat 0.93 0.50 - 0.99 mg/dL   GFR, Est Non African American 66 > OR = 60 mL/min/1.54m2   GFR, Est African American 77 > OR = 60 mL/min/1.11m2   BUN/Creatinine Ratio NOT APPLICABLE 6 - 22 (calc)   Sodium 143 135 - 146 mmol/L   Potassium 5.2 3.5 - 5.3 mmol/L   Chloride 107 98 - 110 mmol/L   CO2 30 20 - 32 mmol/L   Calcium 9.2 8.6 - 10.4 mg/dL   Total Protein 6.8 6.1 - 8.1 g/dL   Albumin 4.6 3.6 - 5.1 g/dL   Globulin 2.2 1.9 - 3.7 g/dL (calc)   AG Ratio 2.1 1.0 - 2.5 (calc)   Total Bilirubin 0.5 0.2 - 1.2 mg/dL   Alkaline phosphatase (APISO) 62 37 - 153 U/L   AST 28 10 - 35 U/L   ALT 21 6 - 29 U/L      Assessment & Plan:   Problem List Items Addressed This Visit    Mixed hyperlipidemia - Primary    Controlled cholesterol on statin and lifestyletherapy - this is preventative Last lipid panel 02/2018 Prior hollenhorst plaque, followed by Cardiology  Plan: 1. Continue current meds - Rosuvastatin 40mg  2. Continue ASA 81mg  for primary ASCVD risk reduction 3. Encourage improved lifestyle - low carb/cholesterol, reduce portion size, continue improving regular exercise  Re-check labs in 5 months for annual      PTSD (post-traumatic stress disorder)    Stable chronic history PTSD w/ anxiety Still affected mood by passing of sister, gradually improving On chronic BDZ Continue Zoloft, Trazodone, Abilify Agree to continue refill Lorazepam for now awaiting further input from psych - may continue adjust dose  Scheduled as new patient with Dr Ferdie Ping 03/27/18         No orders of the defined types were placed in this encounter.    Follow up plan: Return in about 5 months (around 08/13/2018) for Annual Physical.  Future labs ordered for 08/2018 -  iron  anemia panel, TSH, CMET, CBC, Mag, A1c, lipid, Vitamin D,  Vitamin B12  Nobie Putnam, DO [facility] 03/13/2018, 8:23 AM

## 2018-03-13 NOTE — Addendum Note (Signed)
Addended by: Olin Hauser on: 03/13/2018 05:09 PM   Modules accepted: Orders

## 2018-03-13 NOTE — Patient Instructions (Addendum)
Thank you for coming to the office today.  Keep on current medications  Lab results are great - continue on cholesterol medication.  Continue on Abilify 5mg   Keep apt with Dr Shea Evans on 3/19  DUE for FASTING BLOOD WORK (no food or drink after midnight before the lab appointment, only water or coffee without cream/sugar on the morning of)  SCHEDULE "Lab Only" visit in the morning at the clinic for lab draw in 4 MONTHS   - Make sure Lab Only appointment is at about 1 week before your next appointment, so that results will be available  For Lab Results, once available within 2-3 days of blood draw, you can can log in to MyChart online to view your results and a brief explanation. Also, we can discuss results at next follow-up visit.   Please schedule a Follow-up Appointment to: Return in about 5 months (around 08/13/2018) for Annual Physical.  If you have any other questions or concerns, please feel free to call the office or send a message through [facility]. You may also schedule an earlier appointment if necessary.  Additionally, you may be receiving a survey about your experience at our office within a few days to 1 week by e-mail or mail. We value your feedback.  Nobie Putnam, DO [place]

## 2018-03-13 NOTE — Assessment & Plan Note (Signed)
Controlled cholesterol on statin and lifestyletherapy - this is preventative Last lipid panel 02/2018 Prior hollenhorst plaque, followed by Cardiology  Plan: 1. Continue current meds - Rosuvastatin 40mg  2. Continue ASA 81mg  for primary ASCVD risk reduction 3. Encourage improved lifestyle - low carb/cholesterol, reduce portion size, continue improving regular exercise  Re-check labs in 5 months for annual

## 2018-03-13 NOTE — Assessment & Plan Note (Signed)
Stable chronic history PTSD w/ anxiety Still affected mood by passing of sister, gradually improving On chronic BDZ Continue Zoloft, Trazodone, Abilify Agree to continue refill Lorazepam for now awaiting further input from psych - may continue adjust dose  Scheduled as new patient with Dr Shea Evans [facility] 03/27/18

## 2018-03-21 NOTE — Telephone Encounter (Signed)
Already responded to prior message today same complaint.  Ashley Fields, [facility] 03/21/2018, 6:09 PM

## 2018-03-27 ENCOUNTER — Ambulatory Visit: Payer: Self-pay | Admitting: Psychiatry

## 2018-03-28 DIAGNOSIS — M545 Low back pain: Secondary | ICD-10-CM | POA: Diagnosis not present

## 2018-03-28 DIAGNOSIS — M533 Sacrococcygeal disorders, not elsewhere classified: Secondary | ICD-10-CM | POA: Diagnosis not present

## 2018-03-28 DIAGNOSIS — G894 Chronic pain syndrome: Secondary | ICD-10-CM | POA: Diagnosis not present

## 2018-03-31 ENCOUNTER — Encounter (INDEPENDENT_AMBULATORY_CARE_PROVIDER_SITE_OTHER): Payer: Medicare HMO

## 2018-03-31 ENCOUNTER — Telehealth: Payer: Self-pay | Admitting: Family Medicine

## 2018-03-31 DIAGNOSIS — L03213 Periorbital cellulitis: Secondary | ICD-10-CM | POA: Diagnosis not present

## 2018-03-31 MED ORDER — CLINDAMYCIN HCL 300 MG PO CAPS: 300.0000 mg | ORAL_CAPSULE | Freq: Three times a day (TID) | ORAL | 0 refills | Status: DC

## 2018-03-31 MED ORDER — AMOXICILLIN-POT CLAVULANATE 875-125 MG PO TABS: 1.0000 | ORAL_TABLET | Freq: Two times a day (BID) | ORAL | 0 refills | Status: DC

## 2018-03-31 NOTE — Telephone Encounter (Signed)
Pt's eye is swollen and red [phone]

## 2018-03-31 NOTE — Telephone Encounter (Signed)
Attempted to call patient, did not reach patient, responded to her mychart encounter - as we have advised to proceed w/ my chart visit, she has attached pictures as requested.  Ashley Fields, [facility] 03/31/2018, 4:52 PM

## 2018-03-31 NOTE — Telephone Encounter (Signed)
Patient denies high risk travel or exposure to COVID 19 and denies Symptoms of fever, cough or SOB. Her R upper eyelid inflamed and red, itchy and crusty being going on for 5 days. Advised patient that I will call her back with Dr. Marthann Schiller reply.

## 2018-04-08 ENCOUNTER — Telehealth: Payer: Self-pay

## 2018-04-08 NOTE — Telephone Encounter (Signed)
Patient scheduled for an AWV on 04/15/2018 with [facility], Due to Covid-19 pandemic this is unable to be done in office, called patient to see if she was able to do this virtually.  Left message with patient to call back Tiffany Hill,LPN [phone] DeeDee Mccain- [phone]

## 2018-04-15 ENCOUNTER — Ambulatory Visit: Payer: Self-pay

## 2018-04-16 DIAGNOSIS — M533 Sacrococcygeal disorders, not elsewhere classified: Secondary | ICD-10-CM | POA: Diagnosis not present

## 2018-04-16 NOTE — Telephone Encounter (Signed)
Addendum documentation  Follow-up: - Return or notify office within 1-2 weeks from date of service as advised  At the time of the encounter, patient acknowledged understanding with the above medical recommendations including the limitation of remote medical advice.  Specific follow-up and call-back criteria were given for patient to follow-up or seek medical care more urgently if needed.  Total duration of remote telemedicine services of direct patient care provided to patient: 12 minutes  Nobie Putnam, [facility] 04/16/2018, 6:14 PM

## 2018-05-01 DIAGNOSIS — M791 Myalgia, unspecified site: Secondary | ICD-10-CM | POA: Diagnosis not present

## 2018-05-02 ENCOUNTER — Ambulatory Visit (INDEPENDENT_AMBULATORY_CARE_PROVIDER_SITE_OTHER): Payer: Medicare HMO | Admitting: Licensed Clinical Social Worker

## 2018-05-02 ENCOUNTER — Encounter: Payer: Self-pay | Admitting: Licensed Clinical Social Worker

## 2018-05-02 ENCOUNTER — Other Ambulatory Visit: Payer: Self-pay

## 2018-05-02 DIAGNOSIS — F4310 Post-traumatic stress disorder, unspecified: Secondary | ICD-10-CM

## 2018-05-02 DIAGNOSIS — R69 Illness, unspecified: Secondary | ICD-10-CM | POA: Diagnosis not present
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
## 2018-05-05 DIAGNOSIS — L03213 Periorbital cellulitis: Secondary | ICD-10-CM

## 2018-05-06 MED ORDER — AMOXICILLIN-POT CLAVULANATE 875-125 MG PO TABS: 1.0000 | ORAL_TABLET | Freq: Two times a day (BID) | ORAL | 0 refills | Status: DC

## 2018-05-06 MED ORDER — CLINDAMYCIN HCL 300 MG PO CAPS: 300.0000 mg | ORAL_CAPSULE | Freq: Three times a day (TID) | ORAL | 0 refills | Status: DC

## 2018-05-13 ENCOUNTER — Other Ambulatory Visit: Payer: Self-pay

## 2018-05-13 ENCOUNTER — Ambulatory Visit: Payer: Medicare HMO | Admitting: Psychiatry

## 2018-05-15 ENCOUNTER — Encounter: Payer: Self-pay | Admitting: Psychiatry

## 2018-05-15 ENCOUNTER — Ambulatory Visit (INDEPENDENT_AMBULATORY_CARE_PROVIDER_SITE_OTHER): Payer: Medicare HMO | Admitting: Psychiatry

## 2018-05-15 ENCOUNTER — Other Ambulatory Visit: Payer: Self-pay

## 2018-05-15 ENCOUNTER — Encounter: Payer: Self-pay | Admitting: Licensed Clinical Social Worker

## 2018-05-15 ENCOUNTER — Ambulatory Visit (INDEPENDENT_AMBULATORY_CARE_PROVIDER_SITE_OTHER): Payer: Medicare HMO | Admitting: Licensed Clinical Social Worker

## 2018-05-15 DIAGNOSIS — F410 Panic disorder [episodic paroxysmal anxiety] without agoraphobia: Secondary | ICD-10-CM

## 2018-05-15 DIAGNOSIS — R69 Illness, unspecified: Secondary | ICD-10-CM | POA: Diagnosis not present

## 2018-05-15 DIAGNOSIS — F4310 Post-traumatic stress disorder, unspecified: Secondary | ICD-10-CM

## 2018-05-15 DIAGNOSIS — F3181 Bipolar II disorder: Secondary | ICD-10-CM

## 2018-05-15 MED ORDER — PROPRANOLOL HCL 10 MG PO TABS: 10.0000 mg | ORAL_TABLET | Freq: Three times a day (TID) | ORAL | 1 refills | Status: DC | PRN

## 2018-05-15 MED ORDER — VENLAFAXINE HCL ER 37.5 MG PO CP24: 37.5000 mg | ORAL_CAPSULE | Freq: Every day | ORAL | 0 refills | Status: DC
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
## 2018-05-23 DIAGNOSIS — G894 Chronic pain syndrome: Secondary | ICD-10-CM | POA: Diagnosis not present

## 2018-05-23 DIAGNOSIS — M545 Low back pain: Secondary | ICD-10-CM | POA: Diagnosis not present

## 2018-05-23 DIAGNOSIS — M533 Sacrococcygeal disorders, not elsewhere classified: Secondary | ICD-10-CM | POA: Diagnosis not present

## 2018-05-28 DIAGNOSIS — M5442 Lumbago with sciatica, left side: Secondary | ICD-10-CM

## 2018-05-28 DIAGNOSIS — M792 Neuralgia and neuritis, unspecified: Secondary | ICD-10-CM

## 2018-05-28 DIAGNOSIS — G894 Chronic pain syndrome: Secondary | ICD-10-CM

## 2018-05-28 DIAGNOSIS — G8929 Other chronic pain: Secondary | ICD-10-CM

## 2018-05-28 MED ORDER — GABAPENTIN 800 MG PO TABS: 800.0000 mg | ORAL_TABLET | Freq: Three times a day (TID) | ORAL | 1 refills | Status: DC

## 2018-05-28 MED ORDER — ETODOLAC 500 MG PO TABS: 500.0000 mg | ORAL_TABLET | Freq: Two times a day (BID) | ORAL | 1 refills | Status: DC

## 2018-06-03 ENCOUNTER — Ambulatory Visit (INDEPENDENT_AMBULATORY_CARE_PROVIDER_SITE_OTHER): Payer: Medicare HMO | Admitting: Licensed Clinical Social Worker

## 2018-06-03 ENCOUNTER — Encounter: Payer: Self-pay | Admitting: Psychiatry

## 2018-06-03 ENCOUNTER — Telehealth: Payer: Self-pay | Admitting: Family Medicine

## 2018-06-03 ENCOUNTER — Ambulatory Visit (INDEPENDENT_AMBULATORY_CARE_PROVIDER_SITE_OTHER): Payer: Medicare HMO | Admitting: Psychiatry

## 2018-06-03 ENCOUNTER — Encounter: Payer: Self-pay | Admitting: Licensed Clinical Social Worker

## 2018-06-03 ENCOUNTER — Other Ambulatory Visit: Payer: Self-pay

## 2018-06-03 DIAGNOSIS — F3181 Bipolar II disorder: Secondary | ICD-10-CM

## 2018-06-03 DIAGNOSIS — R69 Illness, unspecified: Secondary | ICD-10-CM | POA: Diagnosis not present

## 2018-06-03 DIAGNOSIS — F410 Panic disorder [episodic paroxysmal anxiety] without agoraphobia: Secondary | ICD-10-CM | POA: Diagnosis not present

## 2018-06-03 DIAGNOSIS — F4310 Post-traumatic stress disorder, unspecified: Secondary | ICD-10-CM | POA: Diagnosis not present

## 2018-06-03 MED ORDER — ARIPIPRAZOLE 10 MG PO TABS: 10.0000 mg | ORAL_TABLET | Freq: Every day | ORAL | 0 refills | Status: DC

## 2018-06-03 MED ORDER — VENLAFAXINE HCL ER 37.5 MG PO CP24: 37.5000 mg | ORAL_CAPSULE | Freq: Every day | ORAL | 1 refills | Status: DC

## 2018-06-03 NOTE — Progress Notes (Signed)
Virtual Visit via Video Note  I connected with Ashley Fields on 06/03/18 at  9:15 AM EDT by a video enabled telemedicine application and verified that I am speaking with the correct person using two identifiers.   I discussed the limitations of evaluation and management by telemedicine and the availability of in person appointments. The patient expressed understanding and agreed to proceed.    I discussed the assessment and treatment plan with the patient. The patient was provided an opportunity to ask questions and all were answered. The patient agreed with the plan and demonstrated an understanding of the instructions.   The patient was advised to call back or seek an in-person evaluation if the symptoms worsen or if the condition fails to improve as anticipated.  [place] MD OP Progress Note  06/03/2018 3:40 PM Ashley Fields  MRN:  [id number]  Chief Complaint:  Chief Complaint    Follow-up     HPI: Ashley Fields is a 63 year old Caucasian female, on disability, lives in [place], currently engaged, has a history of bipolar disorder, PTSD, chronic pain, hypoglycemic episodes was evaluated by telemedicine today.  Patient today reports she continues to struggle with some agitation, mood swings.  She reports she is stressed out about the COVID-19 outbreak.  She reports sleep is fair.  She reports she is tolerating the venlafaxine okay.  She has not noticed any side effects.  She does continue to struggle with some anxiety symptoms however discussed with her to give the venlafaxine more time.  Patient has started psychotherapy sessions with Ms. Alden Hipp.  She does have upcoming appointments coming up.  Patient denies any suicidality, homicidality or perceptual disturbances. Visit Diagnosis:    ICD-10-CM   1. Bipolar 2 disorder (HCC) F31.81 ARIPiprazole (ABILIFY) 10 MG tablet   mild ,mixed  2. PTSD (post-traumatic stress disorder) F43.10 venlafaxine XR (EFFEXOR-XR) 37.5 MG 24 hr capsule  3. Panic  attacks F41.0     Past Psychiatric History: I have reviewed past psychiatric history from my progress note on 05/15/2018.  Past trials of Prozac, Wellbutrin, Lexapro, Celexa, Cymbalta, Rexulti  Past Medical History:  Past Medical History:  Diagnosis Date  . Allergy   . Anemia   . Chronic kidney disease   . Depression   . Disp fx of cuboid bone of right foot, init for clos fx 01/01/2017  . GERD (gastroesophageal reflux disease)   . Headache   . Hepatitis C   . Hyperlipidemia   . Osteoporosis   . Thyroid disease   . Urine incontinence     Past Surgical History:  Procedure Laterality Date  . 5 miscarriages     1977-1985, Blood transfusion s/p miscarriage 1977  . ABDOMINAL HYSTERECTOMY  1987   Total  . APPENDECTOMY  12/2008  . AUGMENTATION MAMMAPLASTY Bilateral 2015   Bilat  . AUGMENTATION MAMMAPLASTY Bilateral 2018   implants redone w/ placement of implants under muscle  . breast lift bilateral, implants  16/38/4665   bilateral, silicon naturel  . BREAST REDUCTION SURGERY Bilateral 1997  . CESAREAN SECTION  07/27/1981   Placenta Previa  . CHOLECYSTECTOMY  03/2004   Lap surgery  . GASTRIC BYPASS  2005   Laparoscopic  . IVC FILTER INSERTION  12/2003   TrapEase Vena Cava Filter  . LUMBAR LAMINECTOMY  06/2006   L4-L5 (spinal fusion)  . mini tummy tuck  05/07/2016   Bilateral bra/back roll lift skin removal  . neck surgery C3-C7  02/10/2015   ACDF  . REDUCTION MAMMAPLASTY Bilateral  1997  . SHOULDER ACROMIOPLASTY Left 03/27/2013   w/ labral debridement  . SPINAL CORD STIMULATOR IMPLANT  11/2010   removed 05/2011    Family Psychiatric History: Have reviewed family psychiatric history from my progress note on 05/15/2018  Family History:  Family History  Problem Relation Age of Onset  . COPD Mother   . Lung cancer Mother   . Diabetes Mother   . Heart disease Mother   . Stroke Mother   . Heart attack Mother   . Heart disease Father   . Heart attack Father 38  .  Depression Sister        x 5 sisters  . Heart disease Brother        x 3 brothers  . Diabetes Brother   . Colon polyps Sister   . Heart attack Sister   . Heart attack Brother   . Breast cancer Neg Hx     Social History: Reviewed social history from my progress note on 05/15/2018 Social History   Socioeconomic History  . Marital status: Married    Spouse name: Not on file  . Number of children: 1  . Years of education: [employer]  . Highest education level: Some college, no degree  Occupational History  . Not on file  Social Needs  . Financial resource strain: Hard  . Food insecurity:    Worry: Never true    Inability: Never true  . Transportation needs:    Medical: No    Non-medical: No  Tobacco Use  . Smoking status: Former Smoker    Years: 4.00  . Smokeless tobacco: Former Network engineer and Sexual Activity  . Alcohol use: No  . Drug use: No  . Sexual activity: Not Currently  Lifestyle  . Physical activity:    Days per week: 5 days    Minutes per session: 20 min  . Stress: Not on file  Relationships  . Social connections:    Talks on phone: Not on file    Gets together: Not on file    Attends religious service: Never    Active member of club or organization: No    Attends meetings of clubs or organizations: Never    Relationship status: Widowed  Other Topics Concern  . Not on file  Social History Narrative  . Not on file    Allergies:  Allergies  Allergen Reactions  . Flagyl [Metronidazole] Anaphylaxis  . Cymbalta [Duloxetine Hcl]   . Tape Rash    Metabolic Disorder Labs: Lab Results  Component Value Date   HGBA1C 4.9 07/30/2017   MPG 94 07/30/2017   No results found for: PROLACTIN Lab Results  Component Value Date   CHOL 133 03/10/2018   TRIG 72 03/10/2018   HDL 80 03/10/2018   CHOLHDL 1.7 03/10/2018   LDLCALC 38 03/10/2018   LDLCALC 49 07/30/2017   Lab Results  Component Value Date   TSH 1.88 07/30/2017    Therapeutic Level  Labs: No results found for: LITHIUM No results found for: VALPROATE No components found for:  CBMZ  Current Medications: Current Outpatient Medications  Medication Sig Dispense Refill  . albuterol (PROVENTIL HFA;VENTOLIN HFA) 108 (90 Base) MCG/ACT inhaler Inhale 2 puffs into the lungs every 4 (four) hours as needed for wheezing or shortness of breath (cough). 1 Inhaler 0  . amoxicillin-clavulanate (AUGMENTIN) 875-125 MG tablet Take 1 tablet by mouth 2 (two) times daily. For 10 days 20 tablet 0  . ARIPiprazole (ABILIFY) 10 MG  tablet Take 1 tablet (10 mg total) by mouth daily. 90 tablet 0  . aspirin 81 MG tablet TAKE 1 TABLET BY MOUTH EVERY DAY    . aspirin EC 81 MG tablet Take 1 tablet (81 mg total) by mouth daily. 90 tablet 3  . Biotin 10000 MCG TABS Take 10,000 mcg by mouth 1 day or 1 dose.    . butalbital-acetaminophen-caffeine (FIORICET, ESGIC) 50-325-40 MG tablet Take 1-2 tablets by mouth every 6 (six) hours as needed for headache. 90 tablet 3  . cholecalciferol (VITAMIN D) 1000 units tablet Take 1,000 Units by mouth daily.    . clindamycin (CLEOCIN) 300 MG capsule Take 1 capsule (300 mg total) by mouth 3 (three) times daily. For 10 days 30 capsule 0  . estradiol (ESTRACE) 1 MG tablet TAKE 1 TABLET BY MOUTH EVERY DAY 90 tablet 1  . etodolac (LODINE) 500 MG tablet Take 1 tablet (500 mg total) by mouth 2 (two) times daily. 180 tablet 1  . ezetimibe (ZETIA) 10 MG tablet TAKE 1 TABLET BY MOUTH EVERY DAY 90 tablet 3  . ferrous sulfate 325 (65 FE) MG tablet Take 325 mg by mouth daily.    . fluticasone (FLONASE) 50 MCG/ACT nasal spray Place 2 sprays into both nostrils daily. 48 g 3  . gabapentin (NEURONTIN) 800 MG tablet Take 1 tablet (800 mg total) by mouth 3 (three) times daily. 270 tablet 1  . LORazepam (ATIVAN) 0.5 MG tablet Take 1 tablet (0.5 mg total) by mouth 2 (two) times daily as needed for anxiety. 60 tablet 2  . Melatonin 10 MG CAPS Take 1 capsule by mouth at bedtime.    . Multiple  Vitamin (MULTIVITAMIN WITH MINERALS) TABS tablet Take 1 tablet by mouth daily.    Marland Kitchen neomycin-polymyxin-hydrocortisone (CORTISPORIN) OTIC solution Place 2 drops into both ears 2 (two) times daily. 10 mL 0  . omeprazole (PRILOSEC) 40 MG capsule Take 1 capsule (40 mg total) by mouth daily. 90 capsule 3  . ondansetron (ZOFRAN ODT) 4 MG disintegrating tablet Take 1 tablet (4 mg total) by mouth every 8 (eight) hours as needed for nausea or vomiting. 30 tablet 2  . oxyCODONE (OXY IR/ROXICODONE) 5 MG immediate release tablet TAKE 1 TABLET BY MOUTH EVERY 8 HOURS FILL ON DNF PRIOR TO 04/29/2018    . PROAIR RESPICLICK 546 (90 Base) MCG/ACT AEPB Inhale 2 puffs into the lungs every 4 (four) hours as needed. 1 each 0  . propranolol (INDERAL) 10 MG tablet Take 1 tablet (10 mg total) by mouth 3 (three) times daily as needed. For anxiety attacks 90 tablet 1  . rosuvastatin (CRESTOR) 40 MG tablet Take 1 tablet (40 mg total) by mouth daily. 90 tablet 3  . tiZANidine (ZANAFLEX) 4 MG tablet Take 1 tablet (4 mg total) by mouth 3 (three) times daily. 90 tablet 1  . traZODone (DESYREL) 150 MG tablet Take 1 tablet (150 mg total) by mouth at bedtime. 90 tablet 1  . Turmeric 500 MG CAPS Take 2 capsules by mouth daily.    Marland Kitchen venlafaxine XR (EFFEXOR-XR) 37.5 MG 24 hr capsule Take 1 capsule (37.5 mg total) by mouth daily with breakfast. For anxiety,mood 30 capsule 1  . vitamin B-12 (CYANOCOBALAMIN) 500 MCG tablet Take 500 mcg by mouth daily.    . vitamin E 100 UNIT capsule Take by mouth daily.     No current facility-administered medications for this visit.      Musculoskeletal: Strength & Muscle Tone: within normal limits Gait &  Station: Observed as sitting Patient leans: N/A  Psychiatric Specialty Exam: Review of Systems  Psychiatric/Behavioral: The patient is nervous/anxious.   All other systems reviewed and are negative.   There were no vitals taken for this visit.There is no height or weight on file to calculate  BMI.  General Appearance: Casual  Eye Contact:  Fair  Speech:  Normal Rate  Volume:  Normal  Mood:  Anxious  Affect:  Appropriate  Thought Process:  Goal Directed and Descriptions of Associations: Intact  Orientation:  Full (Time, Place, and Person)  Thought Content: Logical   Suicidal Thoughts:  No  Homicidal Thoughts:  No  Memory:  Immediate;   Fair Recent;   Fair Remote;   Fair  Judgement:  Fair  Insight:  Fair  Psychomotor Activity:  Normal  Concentration:  Concentration: Fair and Attention Span: Fair  Recall:  [employer] of Knowledge: Fair  Language: Fair  Akathisia:  No  Handed:  Right  AIMS (if indicated): Denies tremors, rigidity, stiffness  Assets:  Communication Skills Desire for Improvement Housing  ADL's:  Intact  Cognition: WNL  Sleep:  improving   Screenings: GAD-7     Office Visit from 03/13/2018 in [facility] Office Visit from 12/12/2017 in [facility] Office Visit from 08/08/2017 in  [facility] Office Visit from 07/05/2017 in [facility] Office Visit from 06/10/2017 in [facility]  Total GAD-7 Score  5  6  3  9  12     PHQ2-9     Office Visit from 03/13/2018 in [facility] Office Visit from 12/12/2017 in [place] from 09/12/2017 in [place] Procedure visit from 08/27/2017 in [place] Office Visit from 08/08/2017 in [place]  PHQ-2 Total Score  1  1  1   0  1  PHQ-9 Total Score  4  3  -  -  8       Assessment and Plan: Roselin is a 63 year old Caucasian female, on disability, engaged, lives in [place], has a history of PTSD, mood lability, panic attacks, chronic pain was evaluated by telemedicine today.  Patient is biologically predisposed given her family history as well as her own medical problems.  Patient has psychosocial stressors  of multiple medical problems and chronic pain.  Patient will continue to benefit from medication changes as well as psychotherapy sessions since he continues to struggle with mood lability.  Plan as noted below.  Plan Bipolar disorder type II-some progress Increase Abilify to 10 mg p.o. daily Continue Effexor XR 37.5 mg p.o. daily with breakfast  For PTSD-improving Effexor as prescribed Continue trazodone 150 mg p.o. nightly. Continue melatonin 10 mg p.o. daily. Continue CBT with Ms. Alden Hipp  Panic attacks- improving Patient has stopped using lorazepam.  She is aware about the risk of combining lorazepam and oxycodone. Continue propranolol 10 mg p.o. 3 times daily PRN for severe anxiety attacks  Follow-up in clinic in 3 to 4 weeks or sooner if needed.  Appointment scheduled for June 18 at 4:15 PM  I have spent atleast 15 minutes non  face to face with patient today. More than 50 % of the time was spent for psychoeducation and supportive psychotherapy and care coordination.  This note was generated in part or whole with voice recognition software. Voice recognition is usually quite accurate but there are transcription  errors that can and very often do occur. I apologize for any typographical errors that were not detected and corrected.        Ursula Alert, MD 06/03/2018, 3:40 PM

## 2018-06-03 NOTE — Telephone Encounter (Signed)
Patient to be called by [facility] staff for triage and discuss treatment options.  Ashley Fields, [facility] 06/03/2018, 4:59 PM

## 2018-06-03 NOTE — Telephone Encounter (Signed)
Patient sent a mychart message this morning with acute symptoms yeast infection.  I am not quite sure what she is asking for, she said the pills don't work fast enough.  Could you call her and request more information on how I can help her? If there is something that normally works for her - if we have treated her for it before then we can send it in as a one time treatment. If it comes back or worsening or she has other concerns she needs a virtual visit.  Nobie Putnam, [facility] 06/03/2018, 4:58 PM
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
## 2018-06-04 NOTE — Telephone Encounter (Signed)
The pt said she purchase a otc medication to take care of her symptoms on yesterday. No need of anything at this time.

## 2018-06-08 ENCOUNTER — Other Ambulatory Visit: Payer: Self-pay | Admitting: Psychiatry

## 2018-06-08 DIAGNOSIS — F3181 Bipolar II disorder: Secondary | ICD-10-CM

## 2018-06-08 DIAGNOSIS — F4310 Post-traumatic stress disorder, unspecified: Secondary | ICD-10-CM

## 2018-06-09 ENCOUNTER — Other Ambulatory Visit: Payer: Self-pay | Admitting: Family Medicine

## 2018-06-09 ENCOUNTER — Other Ambulatory Visit: Payer: Self-pay | Admitting: Psychiatry

## 2018-06-09 DIAGNOSIS — E782 Mixed hyperlipidemia: Secondary | ICD-10-CM

## 2018-06-09 DIAGNOSIS — H34211 Partial retinal artery occlusion, right eye: Secondary | ICD-10-CM

## 2018-06-09 DIAGNOSIS — F4310 Post-traumatic stress disorder, unspecified: Secondary | ICD-10-CM

## 2018-06-12 ENCOUNTER — Other Ambulatory Visit: Payer: Self-pay | Admitting: Cardiovascular Disease

## 2018-06-12 ENCOUNTER — Other Ambulatory Visit: Payer: Self-pay

## 2018-06-12 DIAGNOSIS — H0014 Chalazion left upper eyelid: Secondary | ICD-10-CM | POA: Diagnosis not present

## 2018-06-12 MED ORDER — ROSUVASTATIN CALCIUM 40 MG PO TABS: 40.0000 mg | ORAL_TABLET | Freq: Every day | ORAL | 3 refills | Status: DC

## 2018-06-12 NOTE — Telephone Encounter (Signed)
°*  STAT* If patient is at the pharmacy, call can be transferred to refill team.   1. Which medications need to be refilled? (please list name of each medication and dose if known)  ezetimibe 10 MG  rosuvastatin 40 MG   2. Which pharmacy/location (including street and city if local pharmacy) is medication to be sent to? [facility] in [place]   3. Do they need a 30 day or 90 day supply? 90 day

## 2018-06-18 ENCOUNTER — Encounter: Payer: Self-pay | Admitting: Licensed Clinical Social Worker

## 2018-06-18 ENCOUNTER — Other Ambulatory Visit: Payer: Self-pay

## 2018-06-18 ENCOUNTER — Ambulatory Visit (INDEPENDENT_AMBULATORY_CARE_PROVIDER_SITE_OTHER): Payer: Medicare HMO | Admitting: Licensed Clinical Social Worker

## 2018-06-18 DIAGNOSIS — F410 Panic disorder [episodic paroxysmal anxiety] without agoraphobia: Secondary | ICD-10-CM

## 2018-06-18 DIAGNOSIS — F4310 Post-traumatic stress disorder, unspecified: Secondary | ICD-10-CM

## 2018-06-18 DIAGNOSIS — F3181 Bipolar II disorder: Secondary | ICD-10-CM | POA: Diagnosis not present

## 2018-06-18 DIAGNOSIS — R69 Illness, unspecified: Secondary | ICD-10-CM | POA: Diagnosis not present

## 2018-06-18 NOTE — Progress Notes (Signed)
Virtual Visit via Telephone Note  I connected with Ashley Fields on 06/18/18 at 10:00 AM EDT by telephone and verified that I am speaking with the correct person using two identifiers.   I discussed the limitations, risks, security and privacy concerns of performing an evaluation and management service by telephone and the availability of in person appointments. I also discussed with the patient that there may be a patient responsible charge related to this service. The patient expressed understanding and agreed to proceed.   I discussed the assessment and treatment plan with the patient. The patient was provided an opportunity to ask questions and all were answered. The patient agreed with the plan and demonstrated an understanding of the instructions.   The patient was advised to call back or seek an in-person evaluation if the symptoms worsen or if the condition fails to improve as anticipated.  I provided 30 minutes of non-face-to-face time during this encounter.   Alden Hipp, LCSW    THERAPIST PROGRESS NOTE  Session Time: 1000  Participation Level: Active  Behavioral Response: NAAlertAnxious  Type of Therapy: Individual Therapy  Treatment Goals addressed: Coping  Interventions: CBT  Summary: Ashley Fields is a 63 y.o. female who presents with continued symptoms related to her diagnosis. Ashley Fields reports doing well since our lats session. She reports her sleep has been more regulated which she believes has assisted in improving her mood. She reports she has also felt an improved mood for a majority of the day, "I feel down for about 10-15 minutes in the morning, and then I feel a lot better for the rest of the day." Ashley Fields was unable to articulate what she feels has made the change, though reports thinking it has to do with both medications and trying to be more open with her husband. She reports her depression is still there but, "it's not nearly as bad as it used to be." LCSW  normalized and validated those feelings. This led to a discussion around mood, and how we will always have variations in our mood, but the goal is to have the majority be a positive mood. Ashley Fields expressed agreement and understanding with that idea. Trina reported her flashbacks have decreased, and she will have one for a second or two, but is able to snap out of them more quickly. We discussed grounding techniques she could utilize in those moments to alert her body she is not in that moment. Linzi expressed understanding and agreement with this idea as well. Marveline reports she was planning to go to a family reunion on Saturday in [place], but decided against it because she does not want to drive 355 miles one way by herself. LCSW validated her feelings around this situation, and highlighted the ways she is advocating for herself by saying no to things she knows will not contribute positively to her mood. Yides expressed agreement with that idea as well.   Suicidal/Homicidal: No  Therapist Response: Charmion continues to work towards her tx goals but has not yet reached them. We will continue to work on emotional regulation skills and improving mood by challenging negative thoughts moving forward.   Plan: Return again in 3 weeks.  Diagnosis: Axis I: Post Traumatic Stress Disorder    Axis II: No diagnosis    Alden Hipp, LCSW 06/18/2018

## 2018-06-19 DIAGNOSIS — M545 Low back pain: Secondary | ICD-10-CM | POA: Diagnosis not present

## 2018-06-19 DIAGNOSIS — G894 Chronic pain syndrome: Secondary | ICD-10-CM | POA: Diagnosis not present

## 2018-06-26 ENCOUNTER — Other Ambulatory Visit: Payer: Self-pay

## 2018-06-26 ENCOUNTER — Encounter: Payer: Self-pay | Admitting: Psychiatry

## 2018-06-26 ENCOUNTER — Ambulatory Visit (INDEPENDENT_AMBULATORY_CARE_PROVIDER_SITE_OTHER): Payer: Medicare HMO | Admitting: Psychiatry

## 2018-06-26 DIAGNOSIS — F410 Panic disorder [episodic paroxysmal anxiety] without agoraphobia: Secondary | ICD-10-CM

## 2018-06-26 DIAGNOSIS — F3181 Bipolar II disorder: Secondary | ICD-10-CM | POA: Diagnosis not present

## 2018-06-26 DIAGNOSIS — F4310 Post-traumatic stress disorder, unspecified: Secondary | ICD-10-CM

## 2018-06-26 DIAGNOSIS — R69 Illness, unspecified: Secondary | ICD-10-CM | POA: Diagnosis not present

## 2018-06-26 NOTE — Progress Notes (Signed)
Virtual Visit via Video Note  I connected with Ashley Fields on 06/26/18 at  4:15 PM EDT by a video enabled telemedicine application and verified that I am speaking with the correct person using two identifiers.   I discussed the limitations of evaluation and management by telemedicine and the availability of in person appointments. The patient expressed understanding and agreed to proceed.     I discussed the assessment and treatment plan with the patient. The patient was provided an opportunity to ask questions and all were answered. The patient agreed with the plan and demonstrated an understanding of the instructions.   The patient was advised to call back or seek an in-person evaluation if the symptoms worsen or if the condition fails to improve as anticipated.   [facility] MD OP Progress Note  06/26/2018 5:34 PM [name]  MRN:  [id number]  Chief Complaint:  Chief Complaint    Follow-up     HPI: Ashley Fields is a 63 year old Caucasian female, on disability, lives in [place], currently engaged, has a history of bipolar disorder, PTSD, chronic pain, hypoglycemic episodes was evaluated by telemedicine today.  Patient today reports she is making some progress on the medication.  She however reports she has been feeling slightly down the past few days just because she cannot go to [place] to meet her family.  She reports her fianc broke his wrist recently and was out of work for 13 weeks.  He just started going back to work and hence they cannot afford to take another medication at this time.  Patient otherwise reports the medication changes as helpful.  She reports she has noticed some mild changes however her medications were readjusted only 2 weeks ago and she would like to give it more time.  Currently denies any side effects to the medications.  She has been compliant on them.  She reports she does not think she can afford to keep seeing the therapist since the co-pay is high.  She reports she  just wants to try without therapy for now and will let writer know if that changes.  Patient denies any suicidality, homicidality or perceptual disturbances.   Visit Diagnosis:    ICD-10-CM   1. Bipolar 2 disorder (HCC)  F31.81    mild, mixed   2. PTSD (post-traumatic stress disorder)  F43.10   3. Panic attacks  F41.0     Past Psychiatric History: I have reviewed past psychiatric history from my progress note on 05/15/2018.  Past trials of Prozac, Wellbutrin, Lexapro, Celexa, Cymbalta, Rexulti.  Past Medical History:  Past Medical History:  Diagnosis Date  . Allergy   . Anemia   . Chronic kidney disease   . Depression   . Disp fx of cuboid bone of right foot, init for clos fx 01/01/2017  . GERD (gastroesophageal reflux disease)   . Headache   . Hepatitis C   . Hyperlipidemia   . Osteoporosis   . Thyroid disease   . Urine incontinence     Past Surgical History:  Procedure Laterality Date  . 5 miscarriages     1977-1985, Blood transfusion s/p miscarriage 1977  . ABDOMINAL HYSTERECTOMY  1987   Total  . APPENDECTOMY  12/2008  . AUGMENTATION MAMMAPLASTY Bilateral 2015   Bilat  . AUGMENTATION MAMMAPLASTY Bilateral 2018   implants redone w/ placement of implants under muscle  . breast lift bilateral, implants  81/01/7508   bilateral, silicon naturel  . BREAST REDUCTION SURGERY Bilateral 1997  . CESAREAN SECTION  07/27/1981   Placenta Previa  . CHOLECYSTECTOMY  03/2004   Lap surgery  . GASTRIC BYPASS  2005   Laparoscopic  . IVC FILTER INSERTION  12/2003   TrapEase Vena Cava Filter  . LUMBAR LAMINECTOMY  06/2006   L4-L5 (spinal fusion)  . mini tummy tuck  05/07/2016   Bilateral bra/back roll lift skin removal  . neck surgery C3-C7  02/10/2015   ACDF  . REDUCTION MAMMAPLASTY Bilateral 1997  . SHOULDER ACROMIOPLASTY Left 03/27/2013   w/ labral debridement  . SPINAL CORD STIMULATOR IMPLANT  11/2010   removed 05/2011    Family Psychiatric History: Reviewed family  psychiatric history from my progress note on 05/15/2018.  Family History:  Family History  Problem Relation Age of Onset  . COPD Mother   . Lung cancer Mother   . Diabetes Mother   . Heart disease Mother   . Stroke Mother   . Heart attack Mother   . Heart disease Father   . Heart attack Father 21  . Depression Sister        x 5 sisters  . Heart disease Brother        x 3 brothers  . Diabetes Brother   . Colon polyps Sister   . Heart attack Sister   . Heart attack Brother   . Breast cancer Neg Hx     Social History: Reviewed social history from my progress note on 05/15/2018. Social History   Socioeconomic History  . Marital status: Married    Spouse name: Not on file  . Number of children: 1  . Years of education: [employer]  . Highest education level: Some college, no degree  Occupational History  . Not on file  Social Needs  . Financial resource strain: Hard  . Food insecurity    Worry: Never true    Inability: Never true  . Transportation needs    Medical: No    Non-medical: No  Tobacco Use  . Smoking status: Former Smoker    Years: 4.00  . Smokeless tobacco: Former Network engineer and Sexual Activity  . Alcohol use: No  . Drug use: No  . Sexual activity: Not Currently  Lifestyle  . Physical activity    Days per week: 5 days    Minutes per session: 20 min  . Stress: Not on file  Relationships  . Social Herbalist on phone: Not on file    Gets together: Not on file    Attends religious service: Never    Active member of club or organization: No    Attends meetings of clubs or organizations: Never    Relationship status: Widowed  Other Topics Concern  . Not on file  Social History Narrative  . Not on file    Allergies:  Allergies  Allergen Reactions  . Flagyl [Metronidazole] Anaphylaxis  . Cymbalta [Duloxetine Hcl]   . Tape Rash    Metabolic Disorder Labs: Lab Results  Component Value Date   HGBA1C 4.9 07/30/2017   MPG 94  07/30/2017   No results found for: PROLACTIN Lab Results  Component Value Date   CHOL 133 03/10/2018   TRIG 72 03/10/2018   HDL 80 03/10/2018   CHOLHDL 1.7 03/10/2018   LDLCALC 38 03/10/2018   LDLCALC 49 07/30/2017   Lab Results  Component Value Date   TSH 1.88 07/30/2017    Therapeutic Level Labs: No results found for: LITHIUM No results found for: VALPROATE No components  found for:  CBMZ  Current Medications: Current Outpatient Medications  Medication Sig Dispense Refill  . albuterol (PROVENTIL HFA;VENTOLIN HFA) 108 (90 Base) MCG/ACT inhaler Inhale 2 puffs into the lungs every 4 (four) hours as needed for wheezing or shortness of breath (cough). 1 Inhaler 0  . amoxicillin-clavulanate (AUGMENTIN) 875-125 MG tablet Take 1 tablet by mouth 2 (two) times daily. For 10 days 20 tablet 0  . ARIPiprazole (ABILIFY) 10 MG tablet Take 1 tablet (10 mg total) by mouth daily. 90 tablet 0  . aspirin 81 MG EC tablet TAKE 1 TABLET BY MOUTH EVERY DAY 90 tablet 3  . Biotin 10000 MCG TABS Take 10,000 mcg by mouth 1 day or 1 dose.    . butalbital-acetaminophen-caffeine (FIORICET, ESGIC) 50-325-40 MG tablet Take 1-2 tablets by mouth every 6 (six) hours as needed for headache. 90 tablet 3  . cholecalciferol (VITAMIN D) 1000 units tablet Take 1,000 Units by mouth daily.    . clindamycin (CLEOCIN) 300 MG capsule Take 1 capsule (300 mg total) by mouth 3 (three) times daily. For 10 days 30 capsule 0  . estradiol (ESTRACE) 1 MG tablet TAKE 1 TABLET BY MOUTH EVERY DAY 90 tablet 1  . etodolac (LODINE) 500 MG tablet Take 1 tablet (500 mg total) by mouth 2 (two) times daily. 180 tablet 1  . ezetimibe (ZETIA) 10 MG tablet TAKE ONE TABLET BY MOUTH ONE TIME DAILY  90 tablet 1  . ferrous sulfate 325 (65 FE) MG tablet Take 325 mg by mouth daily.    . fluticasone (FLONASE) 50 MCG/ACT nasal spray Place 2 sprays into both nostrils daily. 48 g 3  . gabapentin (NEURONTIN) 800 MG tablet Take 1 tablet (800 mg total) by  mouth 3 (three) times daily. 270 tablet 1  . LORazepam (ATIVAN) 0.5 MG tablet Take 1 tablet (0.5 mg total) by mouth 2 (two) times daily as needed for anxiety. 60 tablet 2  . Melatonin 10 MG CAPS Take 1 capsule by mouth at bedtime.    . Multiple Vitamin (MULTIVITAMIN WITH MINERALS) TABS tablet Take 1 tablet by mouth daily.    Marland Kitchen neomycin-polymyxin-hydrocortisone (CORTISPORIN) OTIC solution Place 2 drops into both ears 2 (two) times daily. 10 mL 0  . omeprazole (PRILOSEC) 40 MG capsule Take 1 capsule (40 mg total) by mouth daily. 90 capsule 3  . ondansetron (ZOFRAN ODT) 4 MG disintegrating tablet Take 1 tablet (4 mg total) by mouth every 8 (eight) hours as needed for nausea or vomiting. 30 tablet 2  . oxyCODONE (OXY IR/ROXICODONE) 5 MG immediate release tablet TAKE 1 TABLET BY MOUTH EVERY 8 HOURS FILL ON DNF PRIOR TO 04/29/2018    . PROAIR RESPICLICK 329 (90 Base) MCG/ACT AEPB Inhale 2 puffs into the lungs every 4 (four) hours as needed. 1 each 0  . propranolol (INDERAL) 10 MG tablet TAKE 1 TABLET (10 MG TOTAL) BY MOUTH 3 (THREE) TIMES DAILY AS NEEDED. FOR ANXIETY ATTACKS 90 tablet 1  . rosuvastatin (CRESTOR) 40 MG tablet Take 1 tablet (40 mg total) by mouth daily. 90 tablet 3  . tiZANidine (ZANAFLEX) 4 MG tablet Take 1 tablet (4 mg total) by mouth 3 (three) times daily. 90 tablet 1  . traZODone (DESYREL) 150 MG tablet Take 1 tablet (150 mg total) by mouth at bedtime. 90 tablet 1  . Turmeric 500 MG CAPS Take 2 capsules by mouth daily.    Marland Kitchen venlafaxine XR (EFFEXOR-XR) 37.5 MG 24 hr capsule Take 1 capsule (37.5 mg total) by  mouth daily with breakfast. For anxiety,mood 30 capsule 1  . vitamin B-12 (CYANOCOBALAMIN) 500 MCG tablet Take 500 mcg by mouth daily.    . vitamin E 100 UNIT capsule Take by mouth daily.     No current facility-administered medications for this visit.      Musculoskeletal: Strength & Muscle Tone: within normal limits Gait & Station: normal Patient leans: N/A  Psychiatric  Specialty Exam: Review of Systems  Psychiatric/Behavioral: Positive for depression.  All other systems reviewed and are negative.   There were no vitals taken for this visit.There is no height or weight on file to calculate BMI.  General Appearance: Casual  Eye Contact:  Fair  Speech:  Normal Rate  Volume:  Normal  Mood:  Depressed improving  Affect:  Appropriate  Thought Process:  Goal Directed and Descriptions of Associations: Intact  Orientation:  Full (Time, Place, and Person)  Thought Content: Logical   Suicidal Thoughts:  No  Homicidal Thoughts:  No  Memory:  Immediate;   Fair Recent;   Fair Remote;   Fair  Judgement:  Fair  Insight:  Fair  Psychomotor Activity:  Normal  Concentration:  Concentration: Fair and Attention Span: Fair  Recall:  [employer] of Knowledge: Fair  Language: Fair  Akathisia:  No  Handed:  Right  AIMS (if indicated): denies tremors, rigidity,stiffness  Assets:  Communication Skills Desire for Improvement Social Support  ADL's:  Intact  Cognition: WNL  Sleep:  Fair   Screenings: GAD-7     Office Visit from 03/13/2018 in [facility] Office Visit from 12/12/2017 in [facility] Office Visit from 08/08/2017 in [facility] Office Visit from 07/05/2017 in [facility] Office Visit from 06/10/2017 in [facility]  Total GAD-7 Score  5  6  3  9  12     PHQ2-9     Office Visit from 03/13/2018 in [facility] Office Visit from 12/12/2017 in [place] from 09/12/2017 in [place] Procedure visit from 08/27/2017 in [place] Office Visit from 08/08/2017 in [facility]  PHQ-2 Total Score  1  1  1   0  1  PHQ-9 Total Score  4  3  -  -  8       Assessment and Plan: Franki is a 63 year old Caucasian female, on disability, engaged, lives in  [place], has a history of PTSD, mood lability, panic attacks, chronic pain was evaluated by telemedicine today.  Patient is biologically predisposed given her family history as well as her own medical problems.  Patient has psychosocial stressors of multiple medical problems and chronic pain.  Patient however is currently making progress.  We will continue medications as noted below.  Plan Bipolar disorder type II- some progress Abilify 10 mg p.o. daily Effexor XR 37.5 mg p.o. daily with breakfast  For PTSD-improving Effexor as prescribed Trazodone 150 mg p.o. nightly Melatonin 10 mg p.o. daily Continue CBT with Ms. Alden Hipp.  Panic attacks-improving Propranolol 10 mg p.o. 3 times daily as needed for anxiety attacks  Follow-up in clinic in 3 to 4 weeks or sooner if needed.  August 11 at 4:15 PM  I have spent atleast 15 minutes non face to face with patient today. More than 50 % of the time was spent for psychoeducation and supportive psychotherapy and care coordination.  This note  was generated in part or whole with voice recognition software. Voice recognition is usually quite accurate but there are transcription errors that can and very often do occur. I apologize for any typographical errors that were not detected and corrected.        Ursula Alert, MD 06/26/2018, 5:34 PM

## 2018-07-02 ENCOUNTER — Other Ambulatory Visit: Payer: Self-pay | Admitting: Psychiatry

## 2018-07-02 DIAGNOSIS — F3181 Bipolar II disorder: Secondary | ICD-10-CM

## 2018-07-02 DIAGNOSIS — F4310 Post-traumatic stress disorder, unspecified: Secondary | ICD-10-CM

## 2018-07-05 ENCOUNTER — Other Ambulatory Visit: Payer: Self-pay | Admitting: Family Medicine

## 2018-07-05 DIAGNOSIS — F4310 Post-traumatic stress disorder, unspecified: Secondary | ICD-10-CM

## 2018-07-05 DIAGNOSIS — F3341 Major depressive disorder, recurrent, in partial remission: Secondary | ICD-10-CM

## 2018-07-06 ENCOUNTER — Other Ambulatory Visit: Payer: Self-pay | Admitting: Psychiatry

## 2018-07-06 DIAGNOSIS — F3181 Bipolar II disorder: Secondary | ICD-10-CM

## 2018-07-06 DIAGNOSIS — F4310 Post-traumatic stress disorder, unspecified: Secondary | ICD-10-CM

## 2018-07-07 DIAGNOSIS — Z1272 Encounter for screening for malignant neoplasm of vagina: Secondary | ICD-10-CM | POA: Diagnosis not present

## 2018-07-07 DIAGNOSIS — Z124 Encounter for screening for malignant neoplasm of cervix: Secondary | ICD-10-CM | POA: Diagnosis not present

## 2018-07-07 DIAGNOSIS — E669 Obesity, unspecified: Secondary | ICD-10-CM | POA: Diagnosis not present

## 2018-07-07 DIAGNOSIS — Z1239 Encounter for other screening for malignant neoplasm of breast: Secondary | ICD-10-CM | POA: Diagnosis not present

## 2018-07-07 DIAGNOSIS — N393 Stress incontinence (female) (male): Secondary | ICD-10-CM | POA: Diagnosis not present

## 2018-07-08 ENCOUNTER — Other Ambulatory Visit: Payer: Self-pay | Admitting: Obstetrics and Gynecology

## 2018-07-08 DIAGNOSIS — Z1231 Encounter for screening mammogram for malignant neoplasm of breast: Secondary | ICD-10-CM

## 2018-07-09 ENCOUNTER — Ambulatory Visit: Payer: Medicare HMO | Admitting: Licensed Clinical Social Worker

## 2018-07-09 DIAGNOSIS — E669 Obesity, unspecified: Secondary | ICD-10-CM | POA: Diagnosis not present

## 2018-07-17 ENCOUNTER — Telehealth: Payer: Self-pay

## 2018-07-17 DIAGNOSIS — F3181 Bipolar II disorder: Secondary | ICD-10-CM

## 2018-07-17 DIAGNOSIS — F4310 Post-traumatic stress disorder, unspecified: Secondary | ICD-10-CM

## 2018-07-17 MED ORDER — ARIPIPRAZOLE 15 MG PO TABS: 15.0000 mg | ORAL_TABLET | Freq: Every day | ORAL | 0 refills | Status: DC

## 2018-07-17 NOTE — Telephone Encounter (Signed)
Increased Abilify to 15 mg and will send to pharmacy.

## 2018-07-17 NOTE — Telephone Encounter (Signed)
pt called states she feels she is on edge and she wants to know if medications needs to be changed.

## 2018-07-18 NOTE — Telephone Encounter (Signed)
Patient needs a virtual visit to address this concern instead of treating over mychart.  Ashley Fields, [facility] 07/18/2018, 5:25 PM

## 2018-07-21 DIAGNOSIS — H0019 Chalazion unspecified eye, unspecified eyelid: Secondary | ICD-10-CM | POA: Diagnosis not present

## 2018-07-21 NOTE — Telephone Encounter (Signed)
Patient was contacted and scheduled. I agree with asking her to f/u with Dr Ellin Mayhew. Appears to be internal hordeolum/stye  Nobie Putnam, DO [facility] 07/21/2018, 8:28 AM

## 2018-07-21 NOTE — Telephone Encounter (Signed)
Patient scheduled for office visit on 07/22/18.  Nobie Putnam, [facility] 07/21/2018, 8:31 AM

## 2018-07-22 ENCOUNTER — Encounter: Payer: Self-pay | Admitting: Family Medicine

## 2018-07-22 ENCOUNTER — Other Ambulatory Visit: Payer: Self-pay

## 2018-07-22 ENCOUNTER — Ambulatory Visit (INDEPENDENT_AMBULATORY_CARE_PROVIDER_SITE_OTHER): Payer: Medicare HMO | Admitting: Family Medicine

## 2018-07-22 DIAGNOSIS — H0014 Chalazion left upper eyelid: Secondary | ICD-10-CM | POA: Diagnosis not present

## 2018-07-22 DIAGNOSIS — R609 Edema, unspecified: Secondary | ICD-10-CM | POA: Diagnosis not present

## 2018-07-22 DIAGNOSIS — R600 Localized edema: Secondary | ICD-10-CM | POA: Insufficient documentation

## 2018-07-22 MED ORDER — FUROSEMIDE 20 MG PO TABS: 20.0000 mg | ORAL_TABLET | Freq: Every day | ORAL | 2 refills | Status: DC | PRN

## 2018-07-22 NOTE — Progress Notes (Signed)
Subjective:    Patient ID: Ashley Fields, female    DOB: 1955/04/08, 63 y.o.   MRN: [id number]  Ashley Fields is a 63 y.o. female presenting on 07/22/2018 for Edema (obtw lump in eye had seen Dr. Awanda Mink)  Virtual / Telehealth Encounter - Video Visit via [email or web address] The purpose of this virtual visit is to provide medical care while limiting exposure to the novel coronavirus (COVID19) for both patient and office staff.  Consent was obtained for remote visit:  Yes.   Answered questions that patient had about telehealth interaction:  Yes.   I discussed the limitations, risks, security and privacy concerns of performing an evaluation and management service by video/telephone. I also discussed with the patient that there may be a patient responsible charge related to this service. The patient expressed understanding and agreed to proceed.  Patient Location: Home Provider Location: [facility] (Office)   HPI   Left Eye Internal Stye upper Eye vs Chalazion Reports initially seen about 2 weeks ago by Dr Ellin Mayhew optometry for management of stye, treated with topical antibiotic eye drops (neomycin, polymyxin eye drops) at that time, improved soft stye and then seemed to shift towards a harder bump residual, was dx with Chalazion, said if it it did not resolve may need refer to other eye doctor to remove it with procedure. She is doing warm compresses now some relief. Denies eye pain redness drainage spreading redness, fever chills, loss of vision  Peripheral Edema -Reports worsening lately with fluid retention edema in hands and lower extremity, previously on lasix fluid pill PRN in past did well in 2019. She has low normal BP currently and not always checking BP. Tries to stay hydrated, worse swelling in warmer weather. Last labs reviewed Denies any chest pain dyspnea, cough  Depression screen [facility] 2/9 07/22/2018 03/13/2018 12/12/2017  Decreased Interest 0 1 1  Down, Depressed, Hopeless 0  0 0  PHQ - 2 Score 0 1 1  Altered sleeping 1 1 1   Tired, decreased energy 0 0 0  Change in appetite 0 1 1  Feeling bad or failure about yourself  0 0 0  Trouble concentrating 1 1 0  Moving slowly or fidgety/restless 0 0 0  Suicidal thoughts 0 0 0  PHQ-9 Score 2 4 3   Difficult doing work/chores Not difficult at all Somewhat difficult Somewhat difficult  Some recent data might be hidden    Social History   Tobacco Use  . Smoking status: Former Smoker    Years: 4.00  . Smokeless tobacco: Former Network engineer Use Topics  . Alcohol use: No  . Drug use: No    Review of Systems Per HPI unless specifically indicated above     Objective:    There were no vitals taken for this visit.  Wt Readings from Last 3 Encounters:  03/13/18 193 lb (87.5 kg)  01/10/18 193 lb (87.5 kg)  12/12/17 186 lb (84.4 kg)    Physical Exam   Note examination was completely remotely via video observation objective data only  Gen - well-appearing, no acute distress or apparent pain, comfortable HEENT - eyes appear clear without discharge or redness. LEFT eye has appearance of 1 cm or larger swollen firm nodule under left upper outer eyelid Heart/Lungs - cannot examine virtually - observed no evidence of coughing or labored breathing. Skin - no erythema on face or eye. Vascular - peripheral edema +1 appearance of hands and lower extremity Neuro - awake, alert, oriented  Psych - not anxious appearing    Results for orders placed or performed in visit on 03/10/18  Lipid panel  Result Value Ref Range   Cholesterol 133 <200 mg/dL   HDL 80 > OR = 50 mg/dL   Triglycerides 72 <150 mg/dL   LDL Cholesterol (Calc) 38 mg/dL (calc)   Total CHOL/HDL Ratio 1.7 <5.0 (calc)   Non-HDL Cholesterol (Calc) 53 <130 mg/dL (calc)  CBC with Differential/Platelet  Result Value Ref Range   WBC 4.6 3.8 - 10.8 Thousand/uL   RBC 4.64 3.80 - 5.10 Million/uL   Hemoglobin 12.2 11.7 - 15.5 g/dL   HCT 37.5 35.0 - 45.0 %    MCV 80.8 80.0 - 100.0 fL   MCH 26.3 (L) 27.0 - 33.0 pg   MCHC 32.5 32.0 - 36.0 g/dL   RDW 15.3 (H) 11.0 - 15.0 %   Platelets 146 140 - 400 Thousand/uL   MPV 10.6 7.5 - 12.5 fL   Neutro Abs 3,082 1,500 - 7,800 cells/uL   Lymphs Abs 1,030 850 - 3,900 cells/uL   Absolute Monocytes 340 200 - 950 cells/uL   Eosinophils Absolute 129 15 - 500 cells/uL   Basophils Absolute 18 0 - 200 cells/uL   Neutrophils Relative % 67 %   Total Lymphocyte 22.4 %   Monocytes Relative 7.4 %   Eosinophils Relative 2.8 %   Basophils Relative 0.4 %  COMPLETE METABOLIC PANEL WITH GFR  Result Value Ref Range   Glucose, Bld 100 (H) 65 - 99 mg/dL   BUN 17 7 - 25 mg/dL   Creat 0.93 0.50 - 0.99 mg/dL   GFR, Est Non African American 66 > OR = 60 mL/min/1.77m2   GFR, Est African American 77 > OR = 60 mL/min/1.28m2   BUN/Creatinine Ratio NOT APPLICABLE 6 - 22 (calc)   Sodium 143 135 - 146 mmol/L   Potassium 5.2 3.5 - 5.3 mmol/L   Chloride 107 98 - 110 mmol/L   CO2 30 20 - 32 mmol/L   Calcium 9.2 8.6 - 10.4 mg/dL   Total Protein 6.8 6.1 - 8.1 g/dL   Albumin 4.6 3.6 - 5.1 g/dL   Globulin 2.2 1.9 - 3.7 g/dL (calc)   AG Ratio 2.1 1.0 - 2.5 (calc)   Total Bilirubin 0.5 0.2 - 1.2 mg/dL   Alkaline phosphatase (APISO) 62 37 - 153 U/L   AST 28 10 - 35 U/L   ALT 21 6 - 29 U/L      Assessment & Plan:   Problem List Items Addressed This Visit    Chalazion left upper eyelid Stable with gradual improvement, residual chalazion now Followed by Dr Ellin Mayhew Optometry S/p antibiotic eye drops, no sign of secondary infection Reassurance, continue warm compress may f/u in future with other eye doctor for lance if need to remove chalazion    Peripheral edema - Primary   Relevant Medications   furosemide (LASIX) 20 MG tablet      Clinically without obvious cause of edema other than multiple factors can be venous stasis, warmer temperature, may be hydration. Prior history of similar issue - Labs reviewed no clear cause of  edema such as kidney function or CHF based on evaluation and past history course  Plan Repeat trial Lasix 20mg  daily PRN edema, use 1-5 days in future, caution if using more regular, notify office - Caution affect kidney, may lower BP, can check BP more regularly  Meds ordered this encounter  Medications  . furosemide (LASIX) 20 MG  tablet    Sig: Take 1 tablet (20 mg total) by mouth daily as needed for fluid or edema.    Dispense:  30 tablet    Refill:  2      Follow up plan: Return if symptoms worsen or fail to improve, for edema.   Patient verbalizes understanding with the above medical recommendations including the limitation of remote medical advice.  Specific follow-up and call-back criteria were given for patient to follow-up or seek medical care more urgently if needed.  Total duration of direct patient care provided via video conference: 15 minutes    Nobie Putnam, [facility] 07/22/2018, 10:18 AM

## 2018-07-22 NOTE — Patient Instructions (Addendum)
Follow with Dr Ellin Mayhew for Chalazion or hardened eye stye, may need removal Keep on warm compresses for now  Try furosemide lasix again 20mg  daily as needed only for swelling, new rx sent, if need for 1-3 days that is fine, can take up to 1 week but if needing too often then need to notify office, caution lower BP on med, check BP at home, avoid taking if BP < 100/60 persistently. Also try to stay hydrated  Follow-up as needed  Please schedule a Follow-up Appointment to: Return if symptoms worsen or fail to improve, for edema.  If you have any other questions or concerns, please feel free to call the office or send a message through [facility]. You may also schedule an earlier appointment if necessary.  Additionally, you may be receiving a survey about your experience at our office within a few days to 1 week by e-mail or mail. We value your feedback.  Nobie Putnam, DO [place] [facility]

## 2018-07-23 DIAGNOSIS — M533 Sacrococcygeal disorders, not elsewhere classified: Secondary | ICD-10-CM | POA: Diagnosis not present

## 2018-07-23 DIAGNOSIS — G894 Chronic pain syndrome: Secondary | ICD-10-CM | POA: Diagnosis not present

## 2018-07-23 DIAGNOSIS — M545 Low back pain: Secondary | ICD-10-CM | POA: Diagnosis not present

## 2018-07-29 ENCOUNTER — Ambulatory Visit (INDEPENDENT_AMBULATORY_CARE_PROVIDER_SITE_OTHER): Payer: Medicare HMO

## 2018-07-29 ENCOUNTER — Other Ambulatory Visit: Payer: Self-pay | Admitting: Family Medicine

## 2018-07-29 DIAGNOSIS — Z90710 Acquired absence of both cervix and uterus: Secondary | ICD-10-CM

## 2018-07-29 DIAGNOSIS — F5104 Psychophysiologic insomnia: Secondary | ICD-10-CM

## 2018-07-29 DIAGNOSIS — Z0000 Encounter for general adult medical examination without abnormal findings: Secondary | ICD-10-CM

## 2018-07-29 DIAGNOSIS — F4310 Post-traumatic stress disorder, unspecified: Secondary | ICD-10-CM

## 2018-07-29 NOTE — Progress Notes (Signed)
Subjective:   Ashley Fields is a 63 y.o. female who presents for an Initial Medicare Annual Wellness Visit.  This visit is being conducted via phone call  - after an attmept to do on video chat - due to the COVID-19 pandemic. This patient has given me verbal consent via phone to conduct this visit, patient states they are participating from their home address. Some vital signs may be absent or patient reported.   Patient identification: identified by name, DOB, and current address.    Review of Systems      Cardiac Risk Factors include: advanced age (>79men, >61 women);dyslipidemia     Objective:    Today's Vitals   07/29/18 1130  PainSc: 3    There is no height or weight on file to calculate BMI.  Advanced Directives 07/29/2018 09/12/2017 08/27/2017 07/23/2017 06/27/2017 04/17/2017 03/11/2017  Does Patient Have a Medical Advance Directive? Yes Yes Yes Yes Yes No No  Type of Advance Directive Living will;Healthcare Power of Towns;Living will - [place] in Chart? No - copy requested Yes - - - - -  Would patient like information on creating a medical advance directive? - - - - - No - Patient declined No - Patient declined  Some encounter information is confidential and restricted. Go to Review Flowsheets activity to see all data.    Current Medications (verified) Outpatient Encounter Medications as of 07/29/2018  Medication Sig  . ARIPiprazole (ABILIFY) 15 MG tablet Take 1 tablet (15 mg total) by mouth daily.  Marland Kitchen aspirin 81 MG EC tablet TAKE 1 TABLET BY MOUTH EVERY DAY  . Biotin 10000 MCG TABS Take 10,000 mcg by mouth 1 day or 1 dose.  . estradiol (ESTRACE) 1 MG tablet TAKE 1 TABLET BY MOUTH EVERY DAY  . etodolac (LODINE) 500 MG tablet Take 1 tablet (500 mg total) by mouth 2 (two) times daily.  Marland Kitchen ezetimibe (ZETIA) 10 MG tablet TAKE ONE TABLET BY MOUTH ONE TIME DAILY    . fluticasone (FLONASE) 50 MCG/ACT nasal spray Place 2 sprays into both nostrils daily.  . furosemide (LASIX) 20 MG tablet Take 1 tablet (20 mg total) by mouth daily as needed for fluid or edema.  . gabapentin (NEURONTIN) 800 MG tablet Take 1 tablet (800 mg total) by mouth 3 (three) times daily.  . Melatonin 10 MG CAPS Take 2 capsules by mouth at bedtime.   . Multiple Vitamin (MULTIVITAMIN WITH MINERALS) TABS tablet Take 1 tablet by mouth daily.  Marland Kitchen neomycin-polymyxin-hydrocortisone (CORTISPORIN) OTIC solution Place 2 drops into both ears 2 (two) times daily.  Marland Kitchen omeprazole (PRILOSEC) 40 MG capsule Take 1 capsule (40 mg total) by mouth daily.  . ondansetron (ZOFRAN ODT) 4 MG disintegrating tablet Take 1 tablet (4 mg total) by mouth every 8 (eight) hours as needed for nausea or vomiting.  Marland Kitchen oxyCODONE (OXY IR/ROXICODONE) 5 MG immediate release tablet TAKE 1 TABLET BY MOUTH EVERY 8 HOURS FILL ON DNF PRIOR TO 04/29/2018  . propranolol (INDERAL) 10 MG tablet TAKE 1 TABLET (10 MG TOTAL) BY MOUTH 3 (THREE) TIMES DAILY AS NEEDED. FOR ANXIETY ATTACKS  . rosuvastatin (CRESTOR) 40 MG tablet Take 1 tablet (40 mg total) by mouth daily.  . traZODone (DESYREL) 150 MG tablet TAKE 1 TABLET (150 MG TOTAL) BY MOUTH AT BEDTIME.  Marland Kitchen venlafaxine XR (EFFEXOR-XR) 37.5 MG 24 hr capsule Take 1 capsule (37.5 mg  total) by mouth daily with breakfast. For anxiety,mood  . tiZANidine (ZANAFLEX) 4 MG tablet Take 1 tablet (4 mg total) by mouth 3 (three) times daily.  . [DISCONTINUED] albuterol (PROVENTIL HFA;VENTOLIN HFA) 108 (90 Base) MCG/ACT inhaler Inhale 2 puffs into the lungs every 4 (four) hours as needed for wheezing or shortness of breath (cough).  . [DISCONTINUED] cholecalciferol (VITAMIN D) 1000 units tablet Take 1,000 Units by mouth daily.  . [DISCONTINUED] ferrous sulfate 325 (65 FE) MG tablet Take 325 mg by mouth daily.  . [DISCONTINUED] LORazepam (ATIVAN) 0.5 MG tablet Take 1 tablet (0.5 mg total) by mouth 2 (two) times  daily as needed for anxiety.  . [DISCONTINUED] PROAIR RESPICLICK 008 (90 Base) MCG/ACT AEPB Inhale 2 puffs into the lungs every 4 (four) hours as needed.  . [DISCONTINUED] Turmeric 500 MG CAPS Take 2 capsules by mouth daily.  . [DISCONTINUED] vitamin B-12 (CYANOCOBALAMIN) 500 MCG tablet Take 500 mcg by mouth daily.  . [DISCONTINUED] vitamin E 100 UNIT capsule Take by mouth daily.   No facility-administered encounter medications on file as of 07/29/2018.     Allergies (verified) Flagyl [metronidazole], Cymbalta [duloxetine hcl], and Tape   History: Past Medical History:  Diagnosis Date  . Allergy   . Anemia   . Anxiety   . Arthritis    Yes  . Blood transfusion without reported diagnosis 1977   Transfusions from miscarriage & hemorrhaging  . Chronic kidney disease   . Depression   . Disp fx of cuboid bone of right foot, init for clos fx 01/01/2017  . GERD (gastroesophageal reflux disease)   . Headache   . Hepatitis C   . Hyperlipidemia   . Neuromuscular disorder ([place]) 1997   Falling to much was an eye-opener  . Osteoporosis   . Thyroid disease   . Urine incontinence    Past Surgical History:  Procedure Laterality Date  . 5 miscarriages     1977-1985, Blood transfusion s/p miscarriage 1977  . ABDOMINAL HYSTERECTOMY  1987   Total  . APPENDECTOMY  12/2008  . AUGMENTATION MAMMAPLASTY Bilateral 2015   Bilat  . AUGMENTATION MAMMAPLASTY Bilateral 2018   implants redone w/ placement of implants under muscle  . breast lift bilateral, implants  67/61/9509   bilateral, silicon naturel  . BREAST REDUCTION SURGERY Bilateral 1997  . CESAREAN SECTION  07/27/1981   Placenta Previa  . CHOLECYSTECTOMY  03/2004   Lap surgery  . COSMETIC SURGERY     Breast implants w/ lift, tummy tuck, upper & lower Blepharop  . EYE SURGERY  2017   Cataract surgery & lasik  . GASTRIC BYPASS  2005   Laparoscopic  . IVC FILTER INSERTION  12/2003   TrapEase Vena Cava Filter  . LUMBAR LAMINECTOMY   06/2006   L4-L5 (spinal fusion)  . mini tummy tuck  05/07/2016   Bilateral bra/back roll lift skin removal  . neck surgery C3-C7  02/10/2015   ACDF  . REDUCTION MAMMAPLASTY Bilateral 1997  . SHOULDER ACROMIOPLASTY Left 03/27/2013   w/ labral debridement  . SMALL INTESTINE SURGERY  12/24/2003   Lap Gastric Bypass  . SPINAL CORD STIMULATOR IMPLANT  11/2010   removed 05/2011  . SPINE SURGERY  2008 2017   Fusion L4-L5, ACDF C4-C7   Family History  Problem Relation Age of Onset  . COPD Mother   . Lung cancer Mother   . Diabetes Mother   . Heart disease Mother   . Stroke Mother   . Heart  attack Mother   . Arthritis Mother   . Cancer Mother   . Hypertension Mother   . Obesity Mother   . Varicose Veins Mother   . Heart disease Father   . Heart attack Father 54  . Early death Father   . Depression Sister        x 5 sisters  . COPD Sister   . Hypertension Sister   . Heart disease Brother        x 3 brothers  . Diabetes Brother   . Colon polyps Sister   . Hearing loss Sister   . Heart attack Sister   . Heart attack Brother   . Arthritis Sister   . Varicose Veins Sister   . Arthritis Brother   . Heart disease Brother   . Hypertension Brother   . Cancer Maternal Grandfather   . Stroke Maternal Grandfather   . COPD Sister   . Obesity Sister   . Depression Sister   . Depression Sister   . Heart disease Sister   . Hypertension Sister   . Diabetes Brother   . Heart disease Brother   . Hypertension Brother   . Obesity Brother   . Varicose Veins Brother   . Heart disease Brother   . Hypertension Brother   . Obesity Brother   . Hypertension Sister   . Obesity Sister   . Miscarriages / Stillbirths Maternal Aunt   . Miscarriages / Stillbirths Paternal Aunt   . Breast cancer Neg Hx    Social History   Socioeconomic History  . Marital status: Married    Spouse name: Not on file  . Number of children: 1  . Years of education: [employer]  . Highest education level:  Some college, no degree  Occupational History  . Not on file  Social Needs  . Financial resource strain: Not very hard  . Food insecurity    Worry: Never true    Inability: Never true  . Transportation needs    Medical: No    Non-medical: No  Tobacco Use  . Smoking status: Former Smoker    Packs/day: 0.25    Years: 10.00    Pack years: 2.50    Types: Cigarettes    Quit date: 11/29/1995    Years since quitting: 22.6  . Smokeless tobacco: Never Used  . Tobacco comment: Quite 1997, didnt smoke hardly at all when I was smoking.  Substance and Sexual Activity  . Alcohol use: No  . Drug use: No  . Sexual activity: Yes    Birth control/protection: Condom, Post-menopausal, Surgical    Comment: Total Hysterectomy condoms  Lifestyle  . Physical activity    Days per week: 5 days    Minutes per session: 20 min  . Stress: Not on file  Relationships  . Social Herbalist on phone: Not on file    Gets together: Not on file    Attends religious service: Never    Active member of club or organization: No    Attends meetings of clubs or organizations: Never    Relationship status: Widowed  Other Topics Concern  . Not on file  Social History Narrative  . Not on file    Tobacco Counseling Counseling given: Not Answered Comment: Quite 1997, didnt smoke hardly at all when I was smoking.   Clinical Intake:  Pre-visit preparation completed: Yes  Pain : 0-10 Pain Score: 3  Pain Type: Chronic pain Pain Location: Back  Pain Orientation: Lower Pain Descriptors / Indicators: Aching Pain Onset: More than a month ago Pain Frequency: Constant Pain Relieving Factors: cane  Pain Relieving Factors: cane  Nutritional Risks: None Diabetes: No            Activities of Daily Living In your present state of health, do you have any difficulty performing the following activities: 07/29/2018  Hearing? N  Vision? N  Comment reading glasses  Difficulty concentrating or  making decisions? N  Walking or climbing stairs? Y  Dressing or bathing? N  Doing errands, shopping? N  Preparing Food and eating ? N  Using the Toilet? N  In the past six months, have you accidently leaked urine? Y  Comment wears pads  for protection  Do you have problems with loss of bowel control? N  Managing your Medications? N  Managing your Finances? N  Housekeeping or managing your Housekeeping? N  Some recent data might be hidden     Immunizations and Health Maintenance Immunization History  Administered Date(s) Administered  . Influenza,inj,Quad PF,6+ Mos 09/12/2017  . Influenza-Unspecified 09/12/2017  . Tdap 07/12/2017   There are no preventive care reminders to display for this patient.  Patient Care Team: Olin Hauser, DO as PCP - General (Family Medicine)  Indicate any recent Medical Services you may have received from other than [facility] providers in the past year (date may be approximate).     Assessment:   This is a routine wellness examination for Ashley Fields.  Hearing/Vision screen No exam data present  Dietary issues and exercise activities discussed: Current Exercise Habits: Home exercise routine(feeds her animals at home), Type of exercise: walking, Time (Minutes): 35, Frequency (Times/Week): 7, Weekly Exercise (Minutes/Week): 245, Intensity: Mild, Exercise limited by: None identified  Goals   None    Depression Screen PHQ 2/9 Scores 07/22/2018 03/13/2018 01/10/2018 12/12/2017 09/12/2017 08/27/2017 08/08/2017  PHQ - 2 Score 0 1 - 1 1 0 1  PHQ- 9 Score 2 4 - 3 - - 8  Exception Documentation - - Patient refusal - - - -    Fall Risk Fall Risk  07/29/2018 07/22/2018 01/10/2018 09/12/2017 08/27/2017  Falls in the past year? 1 0 0 No No  Number falls in past yr: 1 - - 1 -  Comment - - - patient fell on Friday 07/12/2017 in a chicken coop, rake punctured her foot and she went to ED. -  Injury with Fall? 0 - - Yes -  Risk Factor Category  - - - - -  Risk for fall  due to : - - - Impaired balance/gait -  Risk for fall due to: Comment - - - - -  Follow up - Falls evaluation completed Falls evaluation completed Falls evaluation completed -   FALL RISK PREVENTION PERTAINING TO THE HOME:  Any stairs in or around the home? Yes  If so, are there any without handrails? No   Home free of loose throw rugs in walkways, pet beds, electrical cords, etc? Yes  Adequate lighting in your home to reduce risk of falls? Yes   ASSISTIVE DEVICES UTILIZED TO PREVENT FALLS:  Life alert? No  Use of a cane, walker or w/c? Yes   cane Grab bars in the bathroom? No  Shower chair or bench in shower? Yes  Elevated toilet seat or a handicapped toilet? No    DME ORDERS:  DME order needed?  No   TIMED UP AND GO:  Unable to perform  Cognitive Function:        Screening Tests Health Maintenance  Topic Date Due  . INFLUENZA VACCINE  08/09/2018  . MAMMOGRAM  05/07/2019  . Fecal DNA (Cologuard)  08/05/2020  . TETANUS/TDAP  07/13/2027  . Hepatitis C Screening  Completed  . HIV Screening  Completed    Qualifies for Shingles Vaccine? Yes  Zostavax completed unknown date . Due for Shingrix. Education has been provided regarding the importance of this vaccine. Pt has been advised to call insurance company to determine out of pocket expense. Advised may also receive vaccine at local pharmacy or Health Dept. Verbalized acceptance and understanding.  Tdap: 07/12/2017  Flu Vaccine: up to date   Pneumococcal Vaccine: not inicated   Cancer Screenings:  Colorectal Screening: Cologuard 08/05/2017  Mammogram: Completed 05/06/2017. Repeat every year, scheduled for 08/21/2018  Bone Density: due at age 70   Lung Cancer Screening: (Low Dose CT Chest recommended if Age 73-80 years, 30 pack-year currently smoking OR have quit w/in 15years.) does not qualify.    Additional Screening:  Hepatitis C Screening: does not qualify; Completed 08/08/2017  Vision Screening:  Recommended annual ophthalmology exams for early detection of glaucoma and other disorders of the eye. Is the patient up to date with their annual eye exam?  Yes  Who is the provider or what is the name of the office in which the pt attends annual eye exams? [facility]  Dental Screening: Recommended annual dental exams for proper oral hygiene  Community Resource Referral:  CRR required this visit?  No       Plan:  I have personally reviewed and addressed the Medicare Annual Wellness questionnaire and have noted the following in the patient's chart:  A. Medical and social history B. Use of alcohol, tobacco or illicit drugs  C. Current medications and supplements D. Functional ability and status E.  Nutritional status F.  Physical activity G. Advance directives H. List of other physicians I.  Hospitalizations, surgeries, and ER visits in previous 12 months J.  [place] such as hearing and vision if needed, cognitive and depression L. Referrals and appointments   In addition, I have reviewed and discussed with patient certain preventive protocols, quality metrics, and best practice recommendations. A written personalized care plan for preventive services as well as general preventive health recommendations were provided to patient.   Signed,    Bevelyn Ngo, LPN   07/20/4578  Nurse Health Advisor   Nurse Notes: none

## 2018-07-29 NOTE — Patient Instructions (Addendum)
[name] , Thank you for taking time to come for your Medicare Wellness Visit. I appreciate your ongoing commitment to your health goals. Please review the following plan we discussed and let me know if I can assist you in the future.   Screening recommendations/referrals: Colonoscopy: cologuard completed 08/05/2017 Mammogram: scheduled for 08/21/2018 Bone Density: due at age 63 Recommended yearly ophthalmology/optometry visit for glaucoma screening and checkup Recommended yearly dental visit for hygiene and checkup  Vaccinations: Influenza vaccine: up to date  Pneumococcal vaccine: up to date Tdap vaccine: up to date Shingles vaccine: shingrix eligible, check with your insurance company for coverage     Advanced directives: Please bring a copy of your health care power of attorney and living will to the office at your convenience.    Next appointment: follow up in one year for your annual wellness visit.  Preventive Care 40-64 Years, Female Preventive care refers to lifestyle choices and visits with your health care provider that can promote health and wellness. What does preventive care include?  A yearly physical exam. This is also called an annual well check.  Dental exams once or twice a year.  Routine eye exams. Ask your health care provider how often you should have your eyes checked.  Personal lifestyle choices, including:  Daily care of your teeth and gums.  Regular physical activity.  Eating a healthy diet.  Avoiding tobacco and drug use.  Limiting alcohol use.  Practicing safe sex.  Taking low-dose aspirin daily starting at age 51.  Taking vitamin and mineral supplements as recommended by your health care provider. What happens during an annual well check? The services and screenings done by your health care provider during your annual well check will depend on your age, overall health, lifestyle risk factors, and family history of disease. Counseling   Your health care provider may ask you questions about your:  Alcohol use.  Tobacco use.  Drug use.  Emotional well-being.  Home and relationship well-being.  Sexual activity.  Eating habits.  Work and work Statistician.  Method of birth control.  Menstrual cycle.  Pregnancy history. Screening  You may have the following tests or measurements:  Height, weight, and BMI.  Blood pressure.  Lipid and cholesterol levels. These may be checked every 5 years, or more frequently if you are over 67 years old.  Skin check.  Lung cancer screening. You may have this screening every year starting at age 65 if you have a 30-pack-year history of smoking and currently smoke or have quit within the past 15 years.  Fecal occult blood test (FOBT) of the stool. You may have this test every year starting at age 67.  Flexible sigmoidoscopy or colonoscopy. You may have a sigmoidoscopy every 5 years or a colonoscopy every 10 years starting at age 63.  Hepatitis C blood test.  Hepatitis B blood test.  Sexually transmitted disease (STD) testing.  Diabetes screening. This is done by checking your blood sugar (glucose) after you have not eaten for a while (fasting). You may have this done every 1-3 years.  Mammogram. This may be done every 1-2 years. Talk to your health care provider about when you should start having regular mammograms. This may depend on whether you have a family history of breast cancer.  BRCA-related cancer screening. This may be done if you have a family history of breast, ovarian, tubal, or peritoneal cancers.  Pelvic exam and Pap test. This may be done every 3 years starting at  age 56. Starting at age 78, this may be done every 5 years if you have a Pap test in combination with an HPV test.  Bone density scan. This is done to screen for osteoporosis. You may have this scan if you are at high risk for osteoporosis. Discuss your test results, treatment options, and if  necessary, the need for more tests with your health care provider. Vaccines  Your health care provider may recommend certain vaccines, such as:  Influenza vaccine. This is recommended every year.  Tetanus, diphtheria, and acellular pertussis (Tdap, Td) vaccine. You may need a Td booster every 10 years.  Zoster vaccine. You may need this after age 75.  Pneumococcal 13-valent conjugate (PCV13) vaccine. You may need this if you have certain conditions and were not previously vaccinated.  Pneumococcal polysaccharide (PPSV23) vaccine. You may need one or two doses if you smoke cigarettes or if you have certain conditions. Talk to your health care provider about which screenings and vaccines you need and how often you need them. This information is not intended to replace advice given to you by your health care provider. Make sure you discuss any questions you have with your health care provider. Document Released: 01/21/2015 Document Revised: 09/14/2015 Document Reviewed: 10/26/2014 Elsevier Interactive Patient Education  2017 [place] Prevention in the Home Falls can cause injuries. They can happen to people of all ages. There are many things you can do to make your home safe and to help prevent falls. What can I do on the outside of my home?  Regularly fix the edges of walkways and driveways and fix any cracks.  Remove anything that might make you trip as you walk through a door, such as a raised step or threshold.  Trim any bushes or trees on the path to your home.  Use bright outdoor lighting.  Clear any walking paths of anything that might make someone trip, such as rocks or tools.  Regularly check to see if handrails are loose or broken. Make sure that both sides of any steps have handrails.  Any raised decks and porches should have guardrails on the edges.  Have any leaves, snow, or ice cleared regularly.  Use sand or salt on walking paths during winter.   Clean up any spills in your garage right away. This includes oil or grease spills. What can I do in the bathroom?  Use night lights.  Install grab bars by the toilet and in the tub and shower. Do not use towel bars as grab bars.  Use non-skid mats or decals in the tub or shower.  If you need to sit down in the shower, use a plastic, non-slip stool.  Keep the floor dry. Clean up any water that spills on the floor as soon as it happens.  Remove soap buildup in the tub or shower regularly.  Attach bath mats securely with double-sided non-slip rug tape.  Do not have throw rugs and other things on the floor that can make you trip. What can I do in the bedroom?  Use night lights.  Make sure that you have a light by your bed that is easy to reach.  Do not use any sheets or blankets that are too big for your bed. They should not hang down onto the floor.  Have a firm chair that has side arms. You can use this for support while you get dressed.  Do not have throw rugs and other things  on the floor that can make you trip. What can I do in the kitchen?  Clean up any spills right away.  Avoid walking on wet floors.  Keep items that you use a lot in easy-to-reach places.  If you need to reach something above you, use a strong step stool that has a grab bar.  Keep electrical cords out of the way.  Do not use floor polish or wax that makes floors slippery. If you must use wax, use non-skid floor wax.  Do not have throw rugs and other things on the floor that can make you trip. What can I do with my stairs?  Do not leave any items on the stairs.  Make sure that there are handrails on both sides of the stairs and use them. Fix handrails that are broken or loose. Make sure that handrails are as long as the stairways.  Check any carpeting to make sure that it is firmly attached to the stairs. Fix any carpet that is loose or worn.  Avoid having throw rugs at the top or bottom of the  stairs. If you do have throw rugs, attach them to the floor with carpet tape.  Make sure that you have a light switch at the top of the stairs and the bottom of the stairs. If you do not have them, ask someone to add them for you. What else can I do to help prevent falls?  Wear shoes that:  Do not have high heels.  Have rubber bottoms.  Are comfortable and fit you well.  Are closed at the toe. Do not wear sandals.  If you use a stepladder:  Make sure that it is fully opened. Do not climb a closed stepladder.  Make sure that both sides of the stepladder are locked into place.  Ask someone to hold it for you, if possible.  Clearly mark and make sure that you can see:  Any grab bars or handrails.  First and last steps.  Where the edge of each step is.  Use tools that help you move around (mobility aids) if they are needed. These include:  Canes.  Walkers.  Scooters.  Crutches.  Turn on the lights when you go into a dark area. Replace any light bulbs as soon as they burn out.  Set up your furniture so you have a clear path. Avoid moving your furniture around.  If any of your floors are uneven, fix them.  If there are any pets around you, be aware of where they are.  Review your medicines with your doctor. Some medicines can make you feel dizzy. This can increase your chance of falling. Ask your doctor what other things that you can do to help prevent falls. This information is not intended to replace advice given to you by your health care provider. Make sure you discuss any questions you have with your health care provider. Document Released: 10/21/2008 Document Revised: 06/02/2015 Document Reviewed: 01/29/2014 Elsevier Interactive Patient Education  2017 [employer]  

## 2018-08-06 ENCOUNTER — Other Ambulatory Visit: Payer: Self-pay | Admitting: Psychiatry

## 2018-08-06 DIAGNOSIS — F4310 Post-traumatic stress disorder, unspecified: Secondary | ICD-10-CM

## 2018-08-07 DIAGNOSIS — E669 Obesity, unspecified: Secondary | ICD-10-CM | POA: Diagnosis not present

## 2018-08-11 ENCOUNTER — Other Ambulatory Visit: Payer: Medicare HMO

## 2018-08-11 ENCOUNTER — Other Ambulatory Visit: Payer: Self-pay

## 2018-08-11 DIAGNOSIS — Z9884 Bariatric surgery status: Secondary | ICD-10-CM

## 2018-08-11 DIAGNOSIS — E782 Mixed hyperlipidemia: Secondary | ICD-10-CM | POA: Diagnosis not present

## 2018-08-11 DIAGNOSIS — F4310 Post-traumatic stress disorder, unspecified: Secondary | ICD-10-CM

## 2018-08-11 DIAGNOSIS — R7309 Other abnormal glucose: Secondary | ICD-10-CM

## 2018-08-11 DIAGNOSIS — E538 Deficiency of other specified B group vitamins: Secondary | ICD-10-CM

## 2018-08-11 DIAGNOSIS — Z0000 Encounter for general adult medical examination without abnormal findings: Secondary | ICD-10-CM | POA: Diagnosis not present

## 2018-08-11 DIAGNOSIS — R7989 Other specified abnormal findings of blood chemistry: Secondary | ICD-10-CM | POA: Diagnosis not present

## 2018-08-11 DIAGNOSIS — R69 Illness, unspecified: Secondary | ICD-10-CM | POA: Diagnosis not present

## 2018-08-11 DIAGNOSIS — E559 Vitamin D deficiency, unspecified: Secondary | ICD-10-CM | POA: Diagnosis not present

## 2018-08-12 LAB — COMPLETE METABOLIC PANEL WITH GFR
AG Ratio: 1.7 (calc) (ref 1.0–2.5)
ALT: 16 U/L (ref 6–29)
AST: 22 U/L (ref 10–35)
Albumin: 4.1 g/dL (ref 3.6–5.1)
Alkaline phosphatase (APISO): 50 U/L (ref 37–153)
BUN: 16 mg/dL (ref 7–25)
CO2: 29 mmol/L (ref 20–32)
Calcium: 9 mg/dL (ref 8.6–10.4)
Chloride: 108 mmol/L (ref 98–110)
Creat: 0.79 mg/dL (ref 0.50–0.99)
GFR, Est African American: 93 mL/min/{1.73_m2} (ref >=60)
GFR, Est Non African American: 80 mL/min/{1.73_m2} (ref >=60)
Globulin: 2.4 g/dL (calc) (ref 1.9–3.7)
Glucose, Bld: 95 mg/dL (ref 65–99)
Potassium: 5.2 mmol/L (ref 3.5–5.3)
Sodium: 144 mmol/L (ref 135–146)
Total Bilirubin: 0.7 mg/dL (ref 0.2–1.2)
Total Protein: 6.5 g/dL (ref 6.1–8.1)

## 2018-08-12 LAB — CBC WITH DIFFERENTIAL/PLATELET
Absolute Monocytes: 288 cells/uL (ref 200–950)
Basophils Absolute: 28 cells/uL (ref 0–200)
Basophils Relative: 0.7 %
Eosinophils Absolute: 168 cells/uL (ref 15–500)
Eosinophils Relative: 4.2 %
HCT: 35.5 % (ref 35.0–45.0)
Hemoglobin: 11.7 g/dL (ref 11.7–15.5)
Lymphs Abs: 1256 cells/uL (ref 850–3900)
MCH: 27.3 pg (ref 27.0–33.0)
MCHC: 33 g/dL (ref 32.0–36.0)
MCV: 82.8 fL (ref 80.0–100.0)
MPV: 10.6 fL (ref 7.5–12.5)
Monocytes Relative: 7.2 %
Neutro Abs: 2260 cells/uL (ref 1500–7800)
Neutrophils Relative %: 56.5 %
Platelets: 164 10*3/uL (ref 140–400)
RBC: 4.29 10*6/uL (ref 3.80–5.10)
RDW: 13.9 % (ref 11.0–15.0)
Total Lymphocyte: 31.4 %
WBC: 4 10*3/uL (ref 3.8–10.8)

## 2018-08-12 LAB — LIPID PANEL
Cholesterol: 138 mg/dL (ref <200)
HDL: 79 mg/dL (ref >=50)
LDL Cholesterol (Calc): 44 mg/dL (calc)
Non-HDL Cholesterol (Calc): 59 mg/dL (calc) (ref <130)
Total CHOL/HDL Ratio: 1.7 (calc) (ref <5.0)
Triglycerides: 69 mg/dL (ref <150)

## 2018-08-12 LAB — HEMOGLOBIN A1C
Hgb A1c MFr Bld: 5.2 % of total Hgb (ref <5.7)
Mean Plasma Glucose: 103 (calc)
eAG (mmol/L): 5.7 (calc)

## 2018-08-12 LAB — VITAMIN B12: Vitamin B-12: 629 pg/mL (ref 200–1100)

## 2018-08-12 LAB — TSH: TSH: 0.86 mIU/L (ref 0.40–4.50)

## 2018-08-12 LAB — VITAMIN D 25 HYDROXY (VIT D DEFICIENCY, FRACTURES): Vit D, 25-Hydroxy: 27 ng/mL — ABNORMAL LOW (ref 30–100)

## 2018-08-12 LAB — IRON,TIBC AND FERRITIN PANEL
%SAT: 11 % (calc) — ABNORMAL LOW (ref 16–45)
Ferritin: 7 ng/mL — ABNORMAL LOW (ref 16–288)
Iron: 48 ug/dL (ref 45–160)
TIBC: 438 mcg/dL (calc) (ref 250–450)

## 2018-08-12 LAB — MAGNESIUM: Magnesium: 2.2 mg/dL (ref 1.5–2.5)

## 2018-08-15 DIAGNOSIS — M48061 Spinal stenosis, lumbar region without neurogenic claudication: Secondary | ICD-10-CM | POA: Diagnosis not present

## 2018-08-15 DIAGNOSIS — G894 Chronic pain syndrome: Secondary | ICD-10-CM | POA: Diagnosis not present

## 2018-08-15 DIAGNOSIS — Z5181 Encounter for therapeutic drug level monitoring: Secondary | ICD-10-CM | POA: Diagnosis not present

## 2018-08-15 DIAGNOSIS — M25552 Pain in left hip: Secondary | ICD-10-CM | POA: Diagnosis not present

## 2018-08-15 DIAGNOSIS — M545 Low back pain: Secondary | ICD-10-CM | POA: Diagnosis not present

## 2018-08-15 DIAGNOSIS — Z79899 Other long term (current) drug therapy: Secondary | ICD-10-CM | POA: Diagnosis not present

## 2018-08-18 ENCOUNTER — Other Ambulatory Visit: Payer: Self-pay

## 2018-08-18 ENCOUNTER — Ambulatory Visit (INDEPENDENT_AMBULATORY_CARE_PROVIDER_SITE_OTHER): Payer: Medicare HMO | Admitting: Family Medicine

## 2018-08-18 ENCOUNTER — Encounter: Payer: Self-pay | Admitting: Family Medicine

## 2018-08-18 VITALS — BP 117/75 | HR 68 | Temp 98.4°F | Resp 16 | Ht 62.0 in | Wt 197.0 lb

## 2018-08-18 DIAGNOSIS — E559 Vitamin D deficiency, unspecified: Secondary | ICD-10-CM

## 2018-08-18 DIAGNOSIS — K909 Intestinal malabsorption, unspecified: Secondary | ICD-10-CM | POA: Diagnosis not present

## 2018-08-18 DIAGNOSIS — Q6672 Congenital pes cavus, left foot: Secondary | ICD-10-CM | POA: Insufficient documentation

## 2018-08-18 DIAGNOSIS — M79672 Pain in left foot: Secondary | ICD-10-CM

## 2018-08-18 DIAGNOSIS — G8929 Other chronic pain: Secondary | ICD-10-CM

## 2018-08-18 DIAGNOSIS — Q667 Congenital pes cavus, unspecified foot: Secondary | ICD-10-CM | POA: Diagnosis not present

## 2018-08-18 DIAGNOSIS — D508 Other iron deficiency anemias: Secondary | ICD-10-CM | POA: Diagnosis not present

## 2018-08-18 DIAGNOSIS — Z9884 Bariatric surgery status: Secondary | ICD-10-CM | POA: Diagnosis not present

## 2018-08-18 DIAGNOSIS — L570 Actinic keratosis: Secondary | ICD-10-CM

## 2018-08-18 NOTE — Progress Notes (Signed)
Subjective:    Patient ID: Ashley Fields, female    DOB: 04-Mar-1955, 63 y.o.   MRN: [id number]  Ashley Fields is a 63 y.o. female presenting on 08/18/2018 for Hyperlipidemia (follow up lab work)   HPI   Actinic Keratosis / Right Eyebrow Reports newly identified spot near R eyebrow with dry flaky red irritated area, some slight burning. She does not feel a lump or bump, or have a mole there. She requests a Refer to Dermatology - sister had similar spot dx as skin cancer, SCC  Left Ankle Pain / Pes Cavus Reports onset pain within past 1 week, difficulty walking in morning when get up. Same foot she broke her toe on in past. Worse during day. Improvement if rest and elevation. Now toe is improved. She has high arches pes cavus, uses her own OTC arch support. Has not seen podiatrist before.  Iron Deficiency Anemia / Sp Gastric Bypass Previously discussed, she remains on oral iron supplement w/ folic acid once daily, otherwise difficulty tolerating. GI intolerance with bypass. She admits fatigue still and tired. She had labs that showed reduced Tsat to 11, previous 16, but low normal hemoglobin. She has normal Vitamin B12, and mild low vitamin D was on Vit D 2k daily Ashley Fields history of pernicious anemia  Left Eye Stye Chalazion - Was referred by Dr Ellin Mayhew to Ophthalmology, but it self resolved.  Hyperlipidemia Follow with Dr Rockey Situ, adjust cholesterol medication, on zetia and statin. Last result was controlled in 08/11/18.   Depression screen [facility] 2/9 08/18/2018 07/22/2018 03/13/2018  Decreased Interest 1 0 1  Down, Depressed, Hopeless 1 0 0  PHQ - 2 Score 2 0 1  Altered sleeping 0 1 1  Tired, decreased energy 3 0 0  Change in appetite 3 0 1  Feeling bad or failure about yourself  1 0 0  Trouble concentrating 1 1 1   Moving slowly or fidgety/restless 1 0 0  Suicidal thoughts 0 0 0  PHQ-9 Score 11 2 4   Difficult doing work/chores Somewhat difficult Not difficult at all Somewhat difficult   Some recent data might be hidden   GAD 7 : Generalized Anxiety Score 08/18/2018 07/22/2018 03/13/2018 12/12/2017  Nervous, Anxious, on Edge 1 2 1 1   Control/stop worrying 1 1 1 1   Worry too much - different things 2 1 1 1   Trouble relaxing 1 2 1 1   Restless 1 2 0 0  Easily annoyed or irritable 2 0 1 1  Afraid - awful might happen 1 0 0 1  Total GAD 7 Score 9 8 5 6   Anxiety Difficulty Somewhat difficult Not difficult at all Somewhat difficult Somewhat difficult     Social History   Tobacco Use  . Smoking status: Former Smoker    Packs/day: 0.25    Years: 10.00    Pack years: 2.50    Types: Cigarettes    Quit date: 11/29/1995    Years since quitting: 22.7  . Smokeless tobacco: Former Systems developer  . Tobacco comment: Quite 1997, didnt smoke hardly at all when I was smoking.  Substance Use Topics  . Alcohol use: No  . Drug use: No    Review of Systems Per HPI unless specifically indicated above     Objective:    BP 117/75   Pulse 68   Temp 98.4 F (36.9 C) (Oral)   Resp 16   Ht 5\' 2"  (1.575 m)   Wt 197 lb (89.4 kg)   BMI 36.03 kg/m  Wt Readings from Last 3 Encounters:  08/18/18 197 lb (89.4 kg)  03/13/18 193 lb (87.5 kg)  01/10/18 193 lb (87.5 kg)    Physical Exam Vitals signs and nursing note reviewed.  Constitutional:      General: She is not in acute distress.    Appearance: She is well-developed. She is not diaphoretic.     Comments: Well-appearing, comfortable, cooperative  HENT:     Head: Normocephalic and atraumatic.  Eyes:     General:        Right eye: No discharge.        Left eye: No discharge.     Conjunctiva/sclera: Conjunctivae normal.  Cardiovascular:     Rate and Rhythm: Normal rate.  Pulmonary:     Effort: Pulmonary effort is normal.  Musculoskeletal:     Comments: Left Foot Pes cavus No erythema edema, bony deformity L 5th toe non tender no obvious deformity Localized area of pain medial ankle area, but no bony tenderness Rest of foot non  tender  Skin:    General: Skin is warm and dry.     Findings: No erythema or rash.     Comments: Right eyebrow region with 1 cm of slight dry red flaking patch of skin, non tender, no nodular density, no pigmented skin lesion  Neurological:     Mental Status: She is alert and oriented to person, place, and time.  Psychiatric:        Behavior: Behavior normal.     Comments: Well groomed, good eye contact, normal speech and thoughts    Results for orders placed or performed in visit on 08/11/18  Magnesium  Result Value Ref Range   Magnesium 2.2 1.5 - 2.5 mg/dL  Iron, TIBC and Ferritin Panel  Result Value Ref Range   Iron 48 45 - 160 mcg/dL   TIBC 438 250 - 450 mcg/dL (calc)   %SAT 11 (L) 16 - 45 % (calc)   Ferritin 7 (L) 16 - 288 ng/mL  Vitamin B12  Result Value Ref Range   Vitamin B-12 629 200 - 1,100 pg/mL  VITAMIN D 25 Hydroxy (Vit-D Deficiency, Fractures)  Result Value Ref Range   Vit D, 25-Hydroxy 27 (L) 30 - 100 ng/mL  TSH  Result Value Ref Range   TSH 0.86 0.40 - 4.50 mIU/L  Lipid panel  Result Value Ref Range   Cholesterol 138 <200 mg/dL   HDL 79 > OR = 50 mg/dL   Triglycerides 69 <150 mg/dL   LDL Cholesterol (Calc) 44 mg/dL (calc)   Total CHOL/HDL Ratio 1.7 <5.0 (calc)   Non-HDL Cholesterol (Calc) 59 <130 mg/dL (calc)  COMPLETE METABOLIC PANEL WITH GFR  Result Value Ref Range   Glucose, Bld 95 65 - 99 mg/dL   BUN 16 7 - 25 mg/dL   Creat 0.79 0.50 - 0.99 mg/dL   GFR, Est Non African American 80 > OR = 60 mL/min/1.19m2   GFR, Est African American 93 > OR = 60 mL/min/1.90m2   BUN/Creatinine Ratio NOT APPLICABLE 6 - 22 (calc)   Sodium 144 135 - 146 mmol/L   Potassium 5.2 3.5 - 5.3 mmol/L   Chloride 108 98 - 110 mmol/L   CO2 29 20 - 32 mmol/L   Calcium 9.0 8.6 - 10.4 mg/dL   Total Protein 6.5 6.1 - 8.1 g/dL   Albumin 4.1 3.6 - 5.1 g/dL   Globulin 2.4 1.9 - 3.7 g/dL (calc)   AG Ratio 1.7 1.0 - 2.5 (calc)  Total Bilirubin 0.7 0.2 - 1.2 mg/dL   Alkaline  phosphatase (APISO) 50 37 - 153 U/L   AST 22 10 - 35 U/L   ALT 16 6 - 29 U/L  CBC with Differential/Platelet  Result Value Ref Range   WBC 4.0 3.8 - 10.8 Thousand/uL   RBC 4.29 3.80 - 5.10 Million/uL   Hemoglobin 11.7 11.7 - 15.5 g/dL   HCT 35.5 35.0 - 45.0 %   MCV 82.8 80.0 - 100.0 fL   MCH 27.3 27.0 - 33.0 pg   MCHC 33.0 32.0 - 36.0 g/dL   RDW 13.9 11.0 - 15.0 %   Platelets 164 140 - 400 Thousand/uL   MPV 10.6 7.5 - 12.5 fL   Neutro Abs 2,260 1,500 - 7,800 cells/uL   Lymphs Abs 1,256 850 - 3,900 cells/uL   Absolute Monocytes 288 200 - 950 cells/uL   Eosinophils Absolute 168 15 - 500 cells/uL   Basophils Absolute 28 0 - 200 cells/uL   Neutrophils Relative % 56.5 %   Total Lymphocyte 31.4 %   Monocytes Relative 7.2 %   Eosinophils Relative 4.2 %   Basophils Relative 0.7 %  Hemoglobin A1c  Result Value Ref Range   Hgb A1c MFr Bld 5.2 <5.7 % of total Hgb   Mean Plasma Glucose 103 (calc)   eAG (mmol/L) 5.7 (calc)      Assessment & Plan:   Problem List Items Addressed This Visit    Abnormal intestinal absorption   S/P gastric bypass   Relevant Medications   Phendimetrazine Tartrate 35 MG TABS   Other Relevant Orders   Ambulatory referral to Hematology    Other Visit Diagnoses    Other iron deficiency anemia    -  Primary   Relevant Medications   FERROUS SULFATE-FOLIC ACID PO  Clinically with some mild chronic symptomatic iron deficiency anemia based on low Tsat%, likely absorption issue with oral iron, s/p gastric bypass  Continue oral Iron supplement for now, may double dose BID in future if tolerated Referral to [facility]ematology for evaluation, may benefit from IV iron therapy in setting of reduced absorption GI     Other Relevant Orders   Ambulatory referral to Hematology   Vitamin D deficiency      Mild low Increase Vit D3 up to 5k daily for 3 months then reduce to 2k    AK (actinic keratosis)      R facial / eyebrow region, mild AK appearance. Not  consistent with ulceration or other concerns Patient has family history sister with SCC skin cancer, she request derm evaluation Referral sent    Relevant Orders   Ambulatory referral to Dermatology   Pes cavus       Relevant Orders   Ambulatory referral to Podiatry   Chronic foot pain, left       Relevant Orders   Ambulatory referral to Podiatry      Chronic problem with L foot pain Known pes cavus chronic issue Previous injury with fractured vs sprained L 5th toe suspected now healed  Referral to [facility] Podiatry for chronic L ankle and foot pain, may warrant new custom orthotic device given symptoms seem to be overuse issue with triggering pain.  No orders of the defined types were placed in this encounter.  Orders Placed This Encounter  Procedures  . Ambulatory referral to Hematology    Referral Priority:   Routine    Referral Type:   Consultation    Referral Reason:  Specialty Services Required    Requested Specialty:   Oncology    Number of Visits Requested:   1  . Ambulatory referral to Dermatology    Referral Priority:   Routine    Referral Type:   Consultation    Referral Reason:   Specialty Services Required    Requested Specialty:   Dermatology    Number of Visits Requested:   1  . Ambulatory referral to Podiatry    Referral Priority:   Routine    Referral Type:   Consultation    Referral Reason:   Specialty Services Required    Requested Specialty:   Podiatry    Number of Visits Requested:   1     Follow up plan: Return in about 3 months (around 11/18/2018) for 3 month follow-up Anemia.   Nobie Putnam, [facility] 08/18/2018, 8:24 AM

## 2018-08-18 NOTE — Patient Instructions (Addendum)
Thank you for coming to the office today.  Hematology specialist for iron deficiency, discuss IV Iron therapy [place] at [facility] (Hematology/Oncology) [place], [place] [address] Phone: [phone]  ----------------------------------------  Referral to Dermatology  [facility]   [place], [place] [address] Hours: 8AM-5PM Phone: [phone]  Stay tuned for apt  ----------------------------------------------------------  Referral to Podiatry - for further evaluation, please discuss prior foot injuries, recent problem, and insoles  [facility] Address: [address] [place], [place] [address] Hours: Open 8AM-5PM Phone: [phone]  Overuse injury, try to keep resting it, may need new custom orthotic  Please schedule a Follow-up Appointment to: Return in about 3 months (around 11/18/2018) for 3 month follow-up Anemia.  If you have any other questions or concerns, please feel free to call the office or send a message through [facility]. You may also schedule an earlier appointment if necessary.  Additionally, you may be receiving a survey about your experience at our office within a few days to 1 week by e-mail or mail. We value your feedback.  Nobie Putnam, DO [place]

## 2018-08-19 ENCOUNTER — Encounter: Payer: Self-pay | Admitting: Psychiatry

## 2018-08-19 ENCOUNTER — Ambulatory Visit (INDEPENDENT_AMBULATORY_CARE_PROVIDER_SITE_OTHER): Payer: Medicare HMO | Admitting: Psychiatry

## 2018-08-19 DIAGNOSIS — F4310 Post-traumatic stress disorder, unspecified: Secondary | ICD-10-CM | POA: Diagnosis not present

## 2018-08-19 DIAGNOSIS — F3181 Bipolar II disorder: Secondary | ICD-10-CM | POA: Diagnosis not present

## 2018-08-19 DIAGNOSIS — F410 Panic disorder [episodic paroxysmal anxiety] without agoraphobia: Secondary | ICD-10-CM | POA: Diagnosis not present

## 2018-08-19 DIAGNOSIS — R69 Illness, unspecified: Secondary | ICD-10-CM | POA: Diagnosis not present

## 2018-08-19 MED ORDER — VENLAFAXINE HCL ER 37.5 MG PO CP24: ORAL_CAPSULE | ORAL | 2 refills | Status: DC

## 2018-08-19 NOTE — Progress Notes (Signed)
Virtual Visit via Video Note  I connected with Ashley Fields on 08/19/18 at  4:15 PM EDT by a video enabled telemedicine application and verified that I am speaking with the correct person using two identifiers.   I discussed the limitations of evaluation and management by telemedicine and the availability of in person appointments. The patient expressed understanding and agreed to proceed.   I discussed the assessment and treatment plan with the patient. The patient was provided an opportunity to ask questions and all were answered. The patient agreed with the plan and demonstrated an understanding of the instructions.   The patient was advised to call back or seek an in-person evaluation if the symptoms worsen or if the condition fails to improve as anticipated.  Raymond MD OP Progress Note  08/19/2018 4:59 PM Ashley Fields  MRN:  [id number]  Chief Complaint:  Chief Complaint    Follow-up     HPI: Ashley Fields is a 63 year old Caucasian female, on disability, lives in [place], currently engaged, has a history of bipolar disorder, PTSD, chronic pain, hypoglycemic episodes, was evaluated by telemedicine today.  Patient today reports she is currently making progress on the Abilify.  She is tolerating the higher dosage of Abilify, she is currently taking 15 mg.  Patient reports sleep is improved.  Patient reports overall she feels better on this combination of medications.  She however currently struggles with some health issues and is being referred to a hematologist because her iron saturation is low.  Patient otherwise denies any concerns.  She continues to have good support system from her fianc.   Visit Diagnosis:    ICD-10-CM   1. Bipolar 2 disorder (HCC)  F31.81    mild  2. PTSD (post-traumatic stress disorder)  F43.10 venlafaxine XR (EFFEXOR-XR) 37.5 MG 24 hr capsule  3. Panic attacks  F41.0     Past Psychiatric History: I have reviewed past psychiatric history from my progress note  on 05/15/2018.  Past trials of Prozac, Wellbutrin, Lexapro, Celexa, Cymbalta, Rexulti.  Past Medical History:  Past Medical History:  Diagnosis Date  . Allergy   . Anemia   . Anxiety   . Arthritis    Yes  . Blood transfusion without reported diagnosis 1977   Transfusions from miscarriage & hemorrhaging  . Chronic kidney disease   . Depression   . Disp fx of cuboid bone of right foot, init for clos fx 01/01/2017  . GERD (gastroesophageal reflux disease)   . Headache   . Hepatitis C   . Hyperlipidemia   . Neuromuscular disorder ([place]) 1997   Falling to much was an eye-opener  . Osteoporosis   . Thyroid disease   . Urine incontinence     Past Surgical History:  Procedure Laterality Date  . 5 miscarriages     1977-1985, Blood transfusion s/p miscarriage 1977  . ABDOMINAL HYSTERECTOMY  1987   Total  . APPENDECTOMY  12/2008  . AUGMENTATION MAMMAPLASTY Bilateral 2015   Bilat  . AUGMENTATION MAMMAPLASTY Bilateral 2018   implants redone w/ placement of implants under muscle  . breast lift bilateral, implants  31/49/7026   bilateral, silicon naturel  . BREAST REDUCTION SURGERY Bilateral 1997  . CESAREAN SECTION  07/27/1981   Placenta Previa  . CHOLECYSTECTOMY  03/2004   Lap surgery  . COSMETIC SURGERY     Breast implants w/ lift, tummy tuck, upper & lower Blepharop  . EYE SURGERY  2017   Cataract surgery & lasik  . GASTRIC BYPASS  2005   Laparoscopic  . IVC FILTER INSERTION  12/2003   TrapEase Vena Cava Filter  . LUMBAR LAMINECTOMY  06/2006   L4-L5 (spinal fusion)  . mini tummy tuck  05/07/2016   Bilateral bra/back roll lift skin removal  . neck surgery C3-C7  02/10/2015   ACDF  . REDUCTION MAMMAPLASTY Bilateral 1997  . SHOULDER ACROMIOPLASTY Left 03/27/2013   w/ labral debridement  . SMALL INTESTINE SURGERY  12/24/2003   Lap Gastric Bypass  . SPINAL CORD STIMULATOR IMPLANT  11/2010   removed 05/2011  . SPINE SURGERY  2008 2017   Fusion L4-L5, ACDF C4-C7     Family Psychiatric History: I have reviewed family psychiatric history from my progress note on 05/15/2018.  Family History:  Family History  Problem Relation Age of Onset  . COPD Mother   . Lung cancer Mother   . Diabetes Mother   . Heart disease Mother   . Stroke Mother   . Heart attack Mother   . Arthritis Mother   . Cancer Mother   . Hypertension Mother   . Obesity Mother   . Varicose Veins Mother   . Heart disease Father   . Heart attack Father 34  . Early death Father   . Depression Sister        x 5 sisters  . COPD Sister   . Hypertension Sister   . Heart disease Brother        x 3 brothers  . Diabetes Brother   . Colon polyps Sister   . Hearing loss Sister   . Heart attack Sister   . Heart attack Brother   . Arthritis Sister   . Varicose Veins Sister   . Arthritis Brother   . Heart disease Brother   . Hypertension Brother   . Cancer Maternal Grandfather   . Stroke Maternal Grandfather   . COPD Sister   . Obesity Sister   . Depression Sister   . Depression Sister   . Heart disease Sister   . Hypertension Sister   . Diabetes Brother   . Heart disease Brother   . Hypertension Brother   . Obesity Brother   . Varicose Veins Brother   . Heart disease Brother   . Hypertension Brother   . Obesity Brother   . Hypertension Sister   . Obesity Sister   . Miscarriages / Stillbirths Maternal Aunt   . Miscarriages / Stillbirths Paternal Aunt   . Breast cancer Neg Hx     Social History: I have reviewed social history from my progress note on 05/15/2018. Social History   Socioeconomic History  . Marital status: Married    Spouse name: Not on file  . Number of children: 1  . Years of education: [employer]  . Highest education level: Some college, no degree  Occupational History  . Not on file  Social Needs  . Financial resource strain: Not very hard  . Food insecurity    Worry: Never true    Inability: Never true  . Transportation needs    Medical:  No    Non-medical: No  Tobacco Use  . Smoking status: Former Smoker    Packs/day: 0.25    Years: 10.00    Pack years: 2.50    Types: Cigarettes    Quit date: 11/29/1995    Years since quitting: 22.7  . Smokeless tobacco: Former Systems developer  . Tobacco comment: Quite 1997, didnt smoke hardly at all when I was smoking.  Substance and Sexual Activity  . Alcohol use: No  . Drug use: No  . Sexual activity: Yes    Birth control/protection: Condom, Post-menopausal, Surgical    Comment: Total Hysterectomy condoms  Lifestyle  . Physical activity    Days per week: 5 days    Minutes per session: 20 min  . Stress: Not on file  Relationships  . Social Herbalist on phone: Not on file    Gets together: Not on file    Attends religious service: Never    Active member of club or organization: No    Attends meetings of clubs or organizations: Never    Relationship status: Widowed  Other Topics Concern  . Not on file  Social History Narrative  . Not on file    Allergies:  Allergies  Allergen Reactions  . Flagyl [Metronidazole] Anaphylaxis  . Cymbalta [Duloxetine Hcl]   . Tape Rash    Metabolic Disorder Labs: Lab Results  Component Value Date   HGBA1C 5.2 08/11/2018   MPG 103 08/11/2018   MPG 94 07/30/2017   No results found for: PROLACTIN Lab Results  Component Value Date   CHOL 138 08/11/2018   TRIG 69 08/11/2018   HDL 79 08/11/2018   CHOLHDL 1.7 08/11/2018   LDLCALC 44 08/11/2018   LDLCALC 38 03/10/2018   Lab Results  Component Value Date   TSH 0.86 08/11/2018   TSH 1.88 07/30/2017    Therapeutic Level Labs: No results found for: LITHIUM No results found for: VALPROATE No components found for:  CBMZ  Current Medications: Current Outpatient Medications  Medication Sig Dispense Refill  . ARIPiprazole (ABILIFY) 15 MG tablet Take 1 tablet (15 mg total) by mouth daily. 90 tablet 0  . aspirin 81 MG EC tablet TAKE 1 TABLET BY MOUTH EVERY DAY 90 tablet 3  .  Biotin 10000 MCG TABS Take 10,000 mcg by mouth 1 day or 1 dose.    . estradiol (ESTRACE) 1 MG tablet TAKE 1 TABLET BY MOUTH EVERY DAY 90 tablet 1  . etodolac (LODINE) 500 MG tablet Take 1 tablet (500 mg total) by mouth 2 (two) times daily. 180 tablet 1  . ezetimibe (ZETIA) 10 MG tablet TAKE ONE TABLET BY MOUTH ONE TIME DAILY  90 tablet 1  . FERROUS SULFATE-FOLIC ACID PO Take by mouth daily.    . fluticasone (FLONASE) 50 MCG/ACT nasal spray Place 2 sprays into both nostrils daily. 48 g 3  . furosemide (LASIX) 20 MG tablet Take 1 tablet (20 mg total) by mouth daily as needed for fluid or edema. 30 tablet 2  . gabapentin (NEURONTIN) 800 MG tablet Take 1 tablet (800 mg total) by mouth 3 (three) times daily. 270 tablet 1  . Melatonin 10 MG CAPS Take 2 capsules by mouth at bedtime.     . Multiple Vitamin (MULTIVITAMIN WITH MINERALS) TABS tablet Take 1 tablet by mouth daily.    Marland Kitchen neomycin-polymyxin-hydrocortisone (CORTISPORIN) OTIC solution Place 2 drops into both ears 2 (two) times daily. 10 mL 0  . omeprazole (PRILOSEC) 40 MG capsule Take 1 capsule (40 mg total) by mouth daily. 90 capsule 3  . ondansetron (ZOFRAN ODT) 4 MG disintegrating tablet Take 1 tablet (4 mg total) by mouth every 8 (eight) hours as needed for nausea or vomiting. 30 tablet 2  . oxyCODONE (OXY IR/ROXICODONE) 5 MG immediate release tablet TAKE 1 TABLET BY MOUTH EVERY 8 HOURS FILL ON DNF PRIOR TO 04/29/2018    .  Phendimetrazine Tartrate 35 MG TABS phendimetrazine tartrate 35 mg tablet  Take 1 tablet 3 times a day by oral route.    . propranolol (INDERAL) 10 MG tablet TAKE 1 TABLET (10 MG TOTAL) BY MOUTH 3 (THREE) TIMES DAILY AS NEEDED. FOR ANXIETY ATTACKS 270 tablet 1  . rosuvastatin (CRESTOR) 40 MG tablet Take 1 tablet (40 mg total) by mouth daily. 90 tablet 3  . tiZANidine (ZANAFLEX) 4 MG tablet Take 1 tablet (4 mg total) by mouth 3 (three) times daily. (Patient taking differently: Take 4 mg by mouth 3 (three) times daily. ) 90  tablet 1  . traZODone (DESYREL) 150 MG tablet TAKE 1 TABLET (150 MG TOTAL) BY MOUTH AT BEDTIME. 90 tablet 1  . venlafaxine XR (EFFEXOR-XR) 37.5 MG 24 hr capsule TAKE ONE CAPSULE BY MOUTH DAILY with breakfast for anxiety/mood 30 capsule 2   No current facility-administered medications for this visit.      Musculoskeletal: Strength & Muscle Tone: UTA Gait & Station: normal Patient leans: N/A  Psychiatric Specialty Exam: Review of Systems  Psychiatric/Behavioral: The patient is nervous/anxious.   All other systems reviewed and are negative.   There were no vitals taken for this visit.There is no height or weight on file to calculate BMI.  General Appearance: Casual  Eye Contact:  Fair  Speech:  Clear and Coherent  Volume:  Normal  Mood:  Anxious improving  Affect:  Appropriate  Thought Process:  Goal Directed and Descriptions of Associations: Intact  Orientation:  Full (Time, Place, and Person)  Thought Content: Logical   Suicidal Thoughts:  No  Homicidal Thoughts:  No  Memory:  Immediate;   Fair Recent;   Fair Remote;   Fair  Judgement:  Fair  Insight:  Fair  Psychomotor Activity:  Normal  Concentration:  Concentration: Fair and Attention Span: Fair  Recall:  [employer] of Knowledge: Fair  Language: Fair  Akathisia:  No  Handed:  Right  AIMS (if indicated): Denies tremors, rigidity  Assets:  Communication Skills Desire for Improvement Housing Intimacy Social Support  ADL's:  Intact  Cognition: WNL  Sleep:  Fair   Screenings: GAD-7     Office Visit from 08/18/2018 in [facility] Office Visit from 07/22/2018 in [facility] Office Visit from 03/13/2018 in [facility] Office Visit from 12/12/2017 in [facility] Office Visit from 08/08/2017 in [facility]  Total GAD-7 Score  9  8  5  6  3     PHQ2-9     Office Visit from 08/18/2018 in [facility] Office Visit from 07/22/2018 in  [facility] Office Visit from 03/13/2018 in [facility] Office Visit from 12/12/2017 in [facility] from 09/12/2017 in [facility]  PHQ-2 Total Score  2  0  1  1  1   PHQ-9 Total Score  11  2  4  3   -       Assessment and Plan: Kirti is a 63 year old Caucasian female, on disability, engaged, lives in [place], has a history of PTSD, mood lability, panic attacks, chronic pain was evaluated by telemedicine today.  Patient is biologically predisposed given her family history as well as her own medical problems.  Patient also with psychosocial stressors of multiple medical issues and chronic pain.  She however is currently making progress on current medication regimen.  Plan Bipolar disorder type  II-improving Abilify 15 mg p.o. daily Venlafaxine XR 37.5 mg p.o. daily with breakfast  For PTSD- improving Effexor as prescribed Trazodone 150 mg p.o. nightly Melatonin 10 mg p.o. daily   For panic attacks-improving Propranolol 10 mg p.o. 3 times daily as needed for anxiety attacks  Patient reports she is unable to afford therapy sessions with Ms. Alden Hipp at this time.  Discussed with patient to follow-up as needed  Follow-up in clinic in 2 months or sooner if needed.  November 6 at 10 AM  I have spent atleast 15 minutes non  face to face with patient today. More than 50 % of the time was spent for psychoeducation and supportive psychotherapy and care coordination.  This note was generated in part or whole with voice recognition software. Voice recognition is usually quite accurate but there are transcription errors that can and very often do occur. I apologize for any typographical errors that were not detected and corrected.          Ursula Alert, MD 08/19/2018, 4:59 PM

## 2018-08-21 ENCOUNTER — Ambulatory Visit
Admission: RE | Admit: 2018-08-21 | Discharge: 2018-08-21 | Disposition: A | Discharge status: Home / Self Care | Payer: Medicare HMO | Source: Ambulatory Visit | Attending: Obstetrics and Gynecology | Admitting: Obstetrics and Gynecology

## 2018-08-21 ENCOUNTER — Other Ambulatory Visit: Payer: Self-pay

## 2018-08-21 DIAGNOSIS — Z1231 Encounter for screening mammogram for malignant neoplasm of breast: Secondary | ICD-10-CM | POA: Insufficient documentation

## 2018-08-21 DIAGNOSIS — R69 Illness, unspecified: Secondary | ICD-10-CM | POA: Diagnosis not present

## 2018-08-22 ENCOUNTER — Inpatient Hospital Stay: Payer: Medicare HMO | Attending: Internal Medicine | Admitting: Internal Medicine

## 2018-08-22 ENCOUNTER — Other Ambulatory Visit: Payer: Self-pay

## 2018-08-22 ENCOUNTER — Encounter: Payer: Self-pay | Admitting: Internal Medicine

## 2018-08-22 DIAGNOSIS — D508 Other iron deficiency anemias: Secondary | ICD-10-CM | POA: Insufficient documentation

## 2018-08-22 DIAGNOSIS — G8929 Other chronic pain: Secondary | ICD-10-CM | POA: Insufficient documentation

## 2018-08-22 DIAGNOSIS — K912 Postsurgical malabsorption, not elsewhere classified: Secondary | ICD-10-CM

## 2018-08-22 DIAGNOSIS — E559 Vitamin D deficiency, unspecified: Secondary | ICD-10-CM

## 2018-08-22 DIAGNOSIS — D509 Iron deficiency anemia, unspecified: Secondary | ICD-10-CM | POA: Diagnosis not present

## 2018-08-22 NOTE — Progress Notes (Signed)
[facility] CONSULT NOTE  Patient Care Team: Olin Hauser, DO as PCP - General (Family Medicine)  CHIEF COMPLAINTS/PURPOSE OF CONSULTATION: Iron deficiency   HEMATOLOGY HISTORY  # ANEMIA IRON DEFICIENCY GASTRIC BYPASS [2005 [place]] EGD-none; colonoscopy-2015- few polyps snared [[place]]  HISTORY OF PRESENTING ILLNESS:  Ashley Fields 63 y.o.  female has been referred to [place] for further evaluation/work-up/treatment of iron deficiency.  Patient states she moved from [place] about 2 years ago because of family reasons.  She states that she had been fatigue of the last many months.  She also noted shortness of breath on exertion.  Denies any leg swelling.  Denies any cough.  Patient has been on iron pills for about 1 to 2 weeks noted to have gastric upset.  Patient states that she had gastric bypass in 2005 in [place].    Blood in stools: None Change in bowel habits- None Blood in urine: None Vaginal bleeding: None Difficulty swallowing: None Abnormal weight loss: None Iron supplementation: P.o. iron/poor tolerance   Review of Systems  Constitutional: Positive for malaise/fatigue. Negative for chills, diaphoresis, fever and weight loss.  HENT: Negative for nosebleeds and sore throat.   Eyes: Negative for double vision.  Respiratory: Positive for shortness of breath. Negative for cough, hemoptysis, sputum production and wheezing.   Cardiovascular: Negative for chest pain, palpitations, orthopnea and leg swelling.  Gastrointestinal: Negative for abdominal pain, blood in stool, constipation, diarrhea, heartburn, melena, nausea and vomiting.  Genitourinary: Negative for dysuria, frequency and urgency.  Musculoskeletal: Negative for back pain and joint pain.  Skin: Negative.  Negative for itching and rash.  Neurological: Negative for dizziness, tingling, focal weakness, weakness and headaches.  Endo/Heme/Allergies: Does not bruise/bleed easily.   Psychiatric/Behavioral: Negative for depression. The patient is not nervous/anxious and does not have insomnia.     MEDICAL HISTORY:  Past Medical History:  Diagnosis Date  . Allergy   . Anemia   . Anxiety   . Arthritis    Yes  . Blood transfusion without reported diagnosis 1977   Transfusions from miscarriage & hemorrhaging  . Chronic kidney disease   . Depression   . Disp fx of cuboid bone of right foot, init for clos fx 01/01/2017  . GERD (gastroesophageal reflux disease)   . Headache   . Hepatitis C   . Hyperlipidemia   . Neuromuscular disorder ([place]) 1997   Falling to much was an eye-opener  . Osteoporosis   . Thyroid disease   . Urine incontinence     SURGICAL HISTORY: Past Surgical History:  Procedure Laterality Date  . 5 miscarriages     1977-1985, Blood transfusion s/p miscarriage 1977  . ABDOMINAL HYSTERECTOMY  1987   Total  . APPENDECTOMY  12/2008  . AUGMENTATION MAMMAPLASTY Bilateral 2015   Bilat  . AUGMENTATION MAMMAPLASTY Bilateral 2018   implants redone w/ placement of implants under muscle  . breast lift bilateral, implants  35/46/5681   bilateral, silicon naturel  . BREAST REDUCTION SURGERY Bilateral 1997  . CESAREAN SECTION  07/27/1981   Placenta Previa  . CHOLECYSTECTOMY  03/2004   Lap surgery  . COSMETIC SURGERY     Breast implants w/ lift, tummy tuck, upper & lower Blepharop  . EYE SURGERY  2017   Cataract surgery & lasik  . GASTRIC BYPASS  2005   Laparoscopic  . IVC FILTER INSERTION  12/2003   TrapEase Vena Cava Filter  . LUMBAR LAMINECTOMY  06/2006   L4-L5 (spinal fusion)  .  mini tummy tuck  05/07/2016   Bilateral bra/back roll lift skin removal  . neck surgery C3-C7  02/10/2015   ACDF  . REDUCTION MAMMAPLASTY Bilateral 1997  . SHOULDER ACROMIOPLASTY Left 03/27/2013   w/ labral debridement  . SMALL INTESTINE SURGERY  12/24/2003   Lap Gastric Bypass  . SPINAL CORD STIMULATOR IMPLANT  11/2010   removed 05/2011  . SPINE SURGERY   2008 2017   Fusion L4-L5, ACDF C4-C7    SOCIAL HISTORY: Social History   Socioeconomic History  . Marital status: Married    Spouse name: Not on file  . Number of children: 1  . Years of education: [employer]  . Highest education level: Some college, no degree  Occupational History  . Not on file  Social Needs  . Financial resource strain: Not very hard  . Food insecurity    Worry: Never true    Inability: Never true  . Transportation needs    Medical: No    Non-medical: No  Tobacco Use  . Smoking status: Former Smoker    Packs/day: 0.25    Years: 10.00    Pack years: 2.50    Types: Cigarettes    Quit date: 11/29/1995    Years since quitting: 22.7  . Smokeless tobacco: Former Systems developer  . Tobacco comment: Quite 1997, didnt smoke hardly at all when I was smoking.  Substance and Sexual Activity  . Alcohol use: No  . Drug use: No  . Sexual activity: Yes    Birth control/protection: Condom, Post-menopausal, Surgical    Comment: Total Hysterectomy condoms  Lifestyle  . Physical activity    Days per week: 5 days    Minutes per session: 20 min  . Stress: Not on file  Relationships  . Social Herbalist on phone: Not on file    Gets together: Not on file    Attends religious service: Never    Active member of club or organization: No    Attends meetings of clubs or organizations: Never    Relationship status: Widowed  . Intimate partner violence    Fear of current or ex partner: No    Emotionally abused: No    Physically abused: No    Forced sexual activity: No  Other Topics Concern  . 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 COPD Mother   . Lung cancer Mother   . Diabetes Mother   . Heart disease Mother   . Stroke Mother   . Heart attack Mother   . Arthritis Mother   . Cancer  Mother   . Hypertension Mother   . Obesity Mother   . Varicose Veins Mother   . Heart disease Father   . Heart attack Father 28  . Early death Father   . Depression Sister        x 5 sisters  . COPD Sister   . Hypertension Sister   . Heart disease Brother        x 3 brothers  . Diabetes Brother   . Colon polyps Sister   . Hearing loss Sister   . Heart attack Sister   . Heart attack Brother   . Arthritis Sister   . Varicose Veins Sister   . Arthritis Brother   . Heart disease Brother   .  Hypertension Brother   . Cancer Maternal Grandfather   . Stroke Maternal Grandfather   . COPD Sister   . Obesity Sister   . Depression Sister   . Depression Sister   . Heart disease Sister   . Hypertension Sister   . Diabetes Brother   . Heart disease Brother   . Hypertension Brother   . Obesity Brother   . Varicose Veins Brother   . Heart disease Brother   . Hypertension Brother   . Obesity Brother   . Hypertension Sister   . Obesity Sister   . Miscarriages / Stillbirths Maternal Aunt   . Miscarriages / Stillbirths Paternal Aunt   . Breast cancer Neg Hx     ALLERGIES:  is allergic to flagyl [metronidazole]; cymbalta [duloxetine hcl]; and tape.  MEDICATIONS:  Current Outpatient Medications  Medication Sig Dispense Refill  . ARIPiprazole (ABILIFY) 15 MG tablet Take 1 tablet (15 mg total) by mouth daily. 90 tablet 0  . Biotin 10000 MCG TABS Take 10,000 mcg by mouth 1 day or 1 dose.    . estradiol (ESTRACE) 1 MG tablet TAKE 1 TABLET BY MOUTH EVERY DAY 90 tablet 1  . ezetimibe (ZETIA) 10 MG tablet TAKE ONE TABLET BY MOUTH ONE TIME DAILY  90 tablet 1  . FERROUS SULFATE-FOLIC ACID PO Take 2 tablets by mouth daily.     . fluticasone (FLONASE) 50 MCG/ACT nasal spray Place 2 sprays into both nostrils daily. 48 g 3  . furosemide (LASIX) 20 MG tablet Take 1 tablet (20 mg total) by mouth daily as needed for fluid or edema. 30 tablet 2  . gabapentin (NEURONTIN) 800 MG tablet Take 1 tablet  (800 mg total) by mouth 3 (three) times daily. 270 tablet 1  . Melatonin 10 MG CAPS Take 2 capsules by mouth at bedtime.     . Multiple Vitamin (MULTIVITAMIN WITH MINERALS) TABS tablet Take 1 tablet by mouth daily.    Marland Kitchen neomycin-polymyxin-hydrocortisone (CORTISPORIN) OTIC solution Place 2 drops into both ears 2 (two) times daily. 10 mL 0  . omeprazole (PRILOSEC) 40 MG capsule Take 1 capsule (40 mg total) by mouth daily. 90 capsule 3  . ondansetron (ZOFRAN ODT) 4 MG disintegrating tablet Take 1 tablet (4 mg total) by mouth every 8 (eight) hours as needed for nausea or vomiting. 30 tablet 2  . oxyCODONE (OXY IR/ROXICODONE) 5 MG immediate release tablet TAKE 1 TABLET BY MOUTH EVERY 8 HOURS FILL ON DNF PRIOR TO 04/29/2018    . Phendimetrazine Tartrate 35 MG TABS phendimetrazine tartrate 35 mg tablet  Take 1 tablet 3 times a day by oral route.    . propranolol (INDERAL) 10 MG tablet TAKE 1 TABLET (10 MG TOTAL) BY MOUTH 3 (THREE) TIMES DAILY AS NEEDED. FOR ANXIETY ATTACKS 270 tablet 1  . rosuvastatin (CRESTOR) 40 MG tablet Take 1 tablet (40 mg total) by mouth daily. 90 tablet 3  . tiZANidine (ZANAFLEX) 4 MG tablet Take 1 tablet (4 mg total) by mouth 3 (three) times daily. (Patient taking differently: Take 4 mg by mouth 3 (three) times daily. ) 90 tablet 1  . traZODone (DESYREL) 150 MG tablet TAKE 1 TABLET (150 MG TOTAL) BY MOUTH AT BEDTIME. 90 tablet 1  . venlafaxine XR (EFFEXOR-XR) 37.5 MG 24 hr capsule TAKE ONE CAPSULE BY MOUTH DAILY with breakfast for anxiety/mood 30 capsule 2  . aspirin 81 MG EC tablet TAKE 1 TABLET BY MOUTH EVERY DAY (Patient not taking: Reported on 08/22/2018) 90 tablet  3  . etodolac (LODINE) 500 MG tablet Take 1 tablet (500 mg total) by mouth 2 (two) times daily. (Patient not taking: Reported on 08/22/2018) 180 tablet 1   No current facility-administered medications for this visit.       PHYSICAL EXAMINATION:   Vitals:   08/22/18 1100  BP: (!) 141/82  Pulse: 85  Resp: 18   Temp: (!) 96.7 F (35.9 C)   There were no vitals filed for this visit.  Physical Exam  Constitutional: She is oriented to person, place, and time and well-developed, well-nourished, and in no distress.  HENT:  Head: Normocephalic and atraumatic.  Mouth/Throat: Oropharynx is clear and moist. No oropharyngeal exudate.  Eyes: Pupils are equal, round, and reactive to light.  Neck: Normal range of motion. Neck supple.  Cardiovascular: Normal rate and regular rhythm.  Pulmonary/Chest: Effort normal and breath sounds normal. No respiratory distress. She has no wheezes.  Abdominal: Soft. Bowel sounds are normal. She exhibits no distension and no mass. There is no abdominal tenderness. There is no rebound and no guarding.  Musculoskeletal: Normal range of motion.        General: No tenderness or edema.  Neurological: She is alert and oriented to person, place, and time.  Skin: Skin is warm.  Psychiatric: Affect normal.    LABORATORY DATA:  I have reviewed the data as listed Lab Results  Component Value Date   WBC 4.0 08/11/2018   HGB 11.7 08/11/2018   HCT 35.5 08/11/2018   MCV 82.8 08/11/2018   PLT 164 08/11/2018   Recent Labs    09/03/17 0805 03/10/18 1111 08/11/18 0804  NA 141 143 144  K 4.9 5.2 5.2  CL 106 107 108  CO2 30 30 29   GLUCOSE 74 100* 95  BUN 14 17 16   CREATININE 0.95 0.93 0.79  CALCIUM 8.9 9.2 9.0  GFRNONAA 65 66 80  GFRAA 75 77 93  PROT  --  6.8 6.5  AST  --  28 22  ALT  --  21 16  BILITOT  --  0.5 0.7     Mm 3d Screen Breast W/implant Bilateral  Result Date: 08/21/2018 CLINICAL DATA:  Screening. EXAM: DIGITAL SCREENING BILATERAL MAMMOGRAM WITH IMPLANTS, CAD AND TOMO The patient has retropectoral implants. Standard and implant displaced views were performed. COMPARISON:  Previous exam(s). ACR Breast Density Category a: The breast tissue is almost entirely fatty. FINDINGS: There are no findings suspicious for malignancy. Images were processed with CAD.  IMPRESSION: No mammographic evidence of malignancy. A result letter of this screening mammogram will be mailed directly to the patient. RECOMMENDATION: Screening mammogram in one year. (Code:SM-B-01Y) BI-RADS CATEGORY  1:  Negative. Electronically Signed   By: Ammie Ferrier M.D.   On: 08/21/2018 08:08    Acquired iron deficiency anemia due to decreased absorption #Iron deficiency-saturation-11% ferritin 7; hemoglobin 11-secondary to gastric malabsorption status post gastric bypass.  Recommend IV Feraheme. Discussed the potential acute infusion reactions with IV iron; which are quite rare.  Patient understands the risk; will proceed with infusions.  #B12 malabsorption-recent B12 levels normal; however recommend sublingual B12 tablets.  #Vitamin D deficiency-malabsorption; recommend vitamin D 5000 units.  #Chronic back pain neck pain-unrelated to her iron deficiency anemia.  No contraindications from hematologic standpoint for steroid injections.  Thank you Dr.Karamegalos for allowing me to participate in the care of your pleasant patient. Please do not hesitate to contact me with questions or concerns in the interim.   # DISPOSITION:  #  IV Ferrahem weekly x3 - start next week  # follow up in 2 months MD- labs-UA cbc/ldh/b12/possible ferrahem- Dr.B    All questions were answered. The patient knows to call the clinic with any problems, questions or concerns.      Cammie Sickle, MD 08/22/2018 12:25 PM

## 2018-08-22 NOTE — Assessment & Plan Note (Signed)
#  Iron deficiency-saturation-11% ferritin 7; hemoglobin 11-secondary to gastric malabsorption status post gastric bypass.  Recommend IV Feraheme. Discussed the potential acute infusion reactions with IV iron; which are quite rare.  Patient understands the risk; will proceed with infusions.  #B12 malabsorption-recent B12 levels normal; however recommend sublingual B12 tablets.  #Vitamin D deficiency-malabsorption; recommend vitamin D 5000 units.  #Chronic back pain neck pain-unrelated to her iron deficiency anemia.  No contraindications from hematologic standpoint for steroid injections.  Thank you Dr.Karamegalos for allowing me to participate in the care of your pleasant patient. Please do not hesitate to contact me with questions or concerns in the interim.   # DISPOSITION:  # IV Ferrahem weekly x3 - start next week  # follow up in 2 months MD- labs-UA cbc/ldh/b12/possible ferrahem- Dr.B

## 2018-08-25 ENCOUNTER — Other Ambulatory Visit: Payer: Self-pay | Admitting: Internal Medicine

## 2018-08-26 DIAGNOSIS — L219 Seborrheic dermatitis, unspecified: Secondary | ICD-10-CM | POA: Diagnosis not present

## 2018-08-26 DIAGNOSIS — L815 Leukoderma, not elsewhere classified: Secondary | ICD-10-CM | POA: Diagnosis not present

## 2018-08-26 DIAGNOSIS — L305 Pityriasis alba: Secondary | ICD-10-CM | POA: Diagnosis not present

## 2018-08-27 ENCOUNTER — Other Ambulatory Visit: Payer: Self-pay | Admitting: Psychiatry

## 2018-08-27 DIAGNOSIS — F4310 Post-traumatic stress disorder, unspecified: Secondary | ICD-10-CM

## 2018-08-27 DIAGNOSIS — M47816 Spondylosis without myelopathy or radiculopathy, lumbar region: Secondary | ICD-10-CM | POA: Diagnosis not present

## 2018-08-27 DIAGNOSIS — F3181 Bipolar II disorder: Secondary | ICD-10-CM

## 2018-08-28 ENCOUNTER — Other Ambulatory Visit: Payer: Self-pay

## 2018-08-29 ENCOUNTER — Inpatient Hospital Stay: Payer: Medicare HMO

## 2018-08-29 ENCOUNTER — Other Ambulatory Visit: Payer: Self-pay

## 2018-08-29 VITALS — BP 126/82 | HR 81 | Temp 95.5°F | Resp 18

## 2018-08-29 DIAGNOSIS — K912 Postsurgical malabsorption, not elsewhere classified: Secondary | ICD-10-CM | POA: Diagnosis not present

## 2018-08-29 DIAGNOSIS — D508 Other iron deficiency anemias: Secondary | ICD-10-CM

## 2018-08-29 DIAGNOSIS — D509 Iron deficiency anemia, unspecified: Secondary | ICD-10-CM | POA: Diagnosis not present

## 2018-08-29 DIAGNOSIS — G8929 Other chronic pain: Secondary | ICD-10-CM | POA: Diagnosis not present

## 2018-08-29 DIAGNOSIS — E559 Vitamin D deficiency, unspecified: Secondary | ICD-10-CM | POA: Diagnosis not present

## 2018-08-29 MED ORDER — SODIUM CHLORIDE 0.9 % IV SOLN: 200.0000 mg | Freq: Once | INTRAVENOUS | Status: DC

## 2018-08-29 MED ORDER — IRON SUCROSE 20 MG/ML IV SOLN
200.0000 mg | Freq: Once | INTRAVENOUS | Status: AC
  Administered 2018-08-29: 200 mg via INTRAVENOUS
  Filled 2018-08-29: qty 10

## 2018-08-29 MED ORDER — SODIUM CHLORIDE 0.9 % IV SOLN
Freq: Once | INTRAVENOUS | Status: AC
  Administered 2018-08-29: 11:00:00 via INTRAVENOUS
  Filled 2018-08-29: qty 250

## 2018-08-29 NOTE — Progress Notes (Signed)
Pt tolerated Venofer IVP well. Pt and Vs stable at discharge.

## 2018-09-02 ENCOUNTER — Other Ambulatory Visit: Payer: Self-pay

## 2018-09-02 ENCOUNTER — Ambulatory Visit: Payer: Medicare HMO | Admitting: Podiatry

## 2018-09-02 ENCOUNTER — Ambulatory Visit (INDEPENDENT_AMBULATORY_CARE_PROVIDER_SITE_OTHER): Payer: Medicare HMO

## 2018-09-02 ENCOUNTER — Other Ambulatory Visit: Payer: Self-pay | Admitting: Podiatry

## 2018-09-02 VITALS — Temp 97.6°F

## 2018-09-02 DIAGNOSIS — M722 Plantar fascial fibromatosis: Secondary | ICD-10-CM | POA: Diagnosis not present

## 2018-09-02 DIAGNOSIS — G5761 Lesion of plantar nerve, right lower limb: Secondary | ICD-10-CM

## 2018-09-02 MED ORDER — METHYLPREDNISOLONE 4 MG PO TBPK: ORAL_TABLET | ORAL | 0 refills | Status: DC

## 2018-09-04 ENCOUNTER — Other Ambulatory Visit: Payer: Self-pay

## 2018-09-04 DIAGNOSIS — E669 Obesity, unspecified: Secondary | ICD-10-CM | POA: Diagnosis not present

## 2018-09-04 DIAGNOSIS — Z6834 Body mass index (BMI) 34.0-34.9, adult: Secondary | ICD-10-CM | POA: Diagnosis not present

## 2018-09-04 NOTE — Progress Notes (Signed)
   HPI: 63 y.o. female presenting today as a new patient with a chief complaint of bilateral foot pain that began 3-4 months ago. She states the pain in the left heel is located in the posterior heel and radiates to the arch. She states the pain is worse in the morning time and after walking in sandals.  She states the pain in the right foot is located in the forefoot and is throbbing and burning. She states it feels like she is stepping on a sharp rock. She has been taping the feet and wearing good, supportive shoes. Patient is here for further evaluation and treatment.   Past Medical History:  Diagnosis Date  . Allergy   . Anemia   . Anxiety   . Arthritis    Yes  . Blood transfusion without reported diagnosis 1977   Transfusions from miscarriage & hemorrhaging  . Chronic kidney disease   . Depression   . Disp fx of cuboid bone of right foot, init for clos fx 01/01/2017  . GERD (gastroesophageal reflux disease)   . Headache   . Hepatitis C   . Hyperlipidemia   . Neuromuscular disorder ([place]) 1997   Falling to much was an eye-opener  . Osteoporosis   . Thyroid disease   . Urine incontinence       Physical Exam: General: The patient is alert and oriented x3 in no acute distress.  Dermatology: Skin is warm, dry and supple bilateral lower extremities. Negative for open lesions or macerations.  Vascular: Palpable pedal pulses bilaterally. No edema or erythema noted. Capillary refill within normal limits.  Neurological: Epicritic and protective threshold grossly intact bilaterally.   Musculoskeletal Exam: Sharp pain with palpation of the 2nd interspace and lateral compression of the metatarsal heads consistent with neuroma.  Positive Conley Canal sign with loadbearing of the forefoot. Pain with palpation noted to the left heel along the plantar fascia.   Radiographic Exam:  Normal osseous mineralization. Joint spaces preserved. No fracture/dislocation/boney destruction.     Assessment: 1.  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 Plantar fasciitis left    Plan of Care:  1. Patient was evaluated. X-Rays reviewed.  2. Injection of 0.5 mLs Celestone Soluspan injected into the neuroma of the right foot.  3. Injection of 0.5 mLs Celestone Soluspan injected into the left heel. 4. Prescription for Medrol Dose Pak provided to patient. Then continue taking Etodolac from pain management.  5. Plantar fascial brace dispensed.  6. Continue using OTC insoles.  7. Compression anklet dispensed.  8. Return to clinic in 4 weeks.    Edrick Kins, DPM [facility]  Dr. Edrick Kins, [place]                                        [place], [place] [address]                Office [phone]  Fax [phone]

## 2018-09-05 ENCOUNTER — Inpatient Hospital Stay: Payer: Medicare HMO

## 2018-09-05 ENCOUNTER — Other Ambulatory Visit: Payer: Self-pay

## 2018-09-05 VITALS — BP 128/81 | HR 91 | Temp 96.0°F | Resp 20

## 2018-09-05 DIAGNOSIS — K912 Postsurgical malabsorption, not elsewhere classified: Secondary | ICD-10-CM | POA: Diagnosis not present

## 2018-09-05 DIAGNOSIS — G8929 Other chronic pain: Secondary | ICD-10-CM | POA: Diagnosis not present

## 2018-09-05 DIAGNOSIS — D508 Other iron deficiency anemias: Secondary | ICD-10-CM

## 2018-09-05 DIAGNOSIS — E559 Vitamin D deficiency, unspecified: Secondary | ICD-10-CM | POA: Diagnosis not present

## 2018-09-05 DIAGNOSIS — D509 Iron deficiency anemia, unspecified: Secondary | ICD-10-CM | POA: Diagnosis not present

## 2018-09-05 MED ORDER — SODIUM CHLORIDE 0.9 % IV SOLN
Freq: Once | INTRAVENOUS | Status: AC
  Administered 2018-09-05: 11:00:00 via INTRAVENOUS
  Filled 2018-09-05: qty 250

## 2018-09-05 MED ORDER — IRON SUCROSE 20 MG/ML IV SOLN
200.0000 mg | Freq: Once | INTRAVENOUS | Status: AC
  Administered 2018-09-05: 200 mg via INTRAVENOUS
  Filled 2018-09-05: qty 10

## 2018-09-09 ENCOUNTER — Ambulatory Visit (INDEPENDENT_AMBULATORY_CARE_PROVIDER_SITE_OTHER): Payer: Medicare HMO | Admitting: Family Medicine

## 2018-09-09 ENCOUNTER — Encounter: Payer: Self-pay | Admitting: Family Medicine

## 2018-09-09 ENCOUNTER — Other Ambulatory Visit: Payer: Self-pay

## 2018-09-09 DIAGNOSIS — J0110 Acute frontal sinusitis, unspecified: Secondary | ICD-10-CM | POA: Diagnosis not present

## 2018-09-09 DIAGNOSIS — H6983 Other specified disorders of Eustachian tube, bilateral: Secondary | ICD-10-CM

## 2018-09-09 MED ORDER — AMOXICILLIN-POT CLAVULANATE 875-125 MG PO TABS: 1.0000 | ORAL_TABLET | Freq: Two times a day (BID) | ORAL | 0 refills | Status: DC

## 2018-09-09 NOTE — Progress Notes (Signed)
Virtual Visit via Telephone The purpose of this virtual visit is to provide medical care while limiting exposure to the novel coronavirus (COVID19) for both patient and office staff.  Consent was obtained for phone visit:  Yes.   Answered questions that patient had about telehealth interaction:  Yes.   I discussed the limitations, risks, security and privacy concerns of performing an evaluation and management service by telephone. I also discussed with the patient that there may be a patient responsible charge related to this service. The patient expressed understanding and agreed to proceed.  Patient Location: Home Provider Location: [facility])   ---------------------------------------------------------------------- Chief Complaint  Patient presents with  . Sore Throat    Sore throat accompanied by sinus pain, weakness, dizziness x3 days    S: Reviewed CMA documentation. I have called patient and gathered additional HPI as follows:  ACUTE SINUSITIS / SORE THROAT / EUSTACHIAN TUBE DYSFUNCTION Reports that symptoms started x 3 days ago with raw sore throat and some purulent drainage, and sinus pressure behind both ears and pain, feels like her equilibrium is off. She tried OTC NyQuil without relief. Has not had fever. - No prior coronavirus concerns or testing.  Denies any high risk travel to areas of current concern for COVID19. Denies any known or suspected exposure to person with or possibly with COVID19.  Denies any fevers, chills, sweats, body ache, cough, shortness of breath, sinus pain or pressure, abdominal pain, diarrhea  -------------------------------------------------------------------------- O: No physical exam performed due to remote telephone encounter.  -------------------------------------------------------------------------- A&P:  Suspected Acute Sinusitis, possible for benign viral etiology at onset - now concern with progression of  symptoms, consider 2nd sickening and cannot rule out bacterial infection. - Also secondary eustachian tube dysfunction / sore throat likely from drainage  Cannot rule out COVID19   Reassuring without high risk symptoms - Afebrile, without dyspnea  1. Start Augmentin BID x 7 days for empiric coverage of sinus/ear infection 2. Use Flonase, allergy med, supportive care 3. Offered COVID19 testing - at test site, gave information, especially if not improving, she will check cost  Meds ordered this encounter  Medications  . amoxicillin-clavulanate (AUGMENTIN) 875-125 MG tablet    Sig: Take 1 tablet by mouth 2 (two) times daily. For 7 days    Dispense:  14 tablet    Refill:  0    OPTIONAL RECOMMENDED self quarantine for patient safety for PREVENTION ONLY. It is not required based on current clinical symptoms. If they were to develop fever or worsening shortness of breath, then emphasis on REQUIRED quarantine for up to 7-14 days that could be resolved if fever free >3 days AND if symptoms improving after 7 days.   If symptoms do not resolve or significantly improve OR if WORSENING - fever / cough - or worsening shortness of breath - then should contact us and seek advice on next steps in treatment at home vs where/when to seek care at Urgent Care or Hospital ED for further intervention and possible testing if indicated.  Patient verbalizes understanding with the above medical recommendations including the limitation of remote medical advice.  Specific follow-up / call-back criteria were given for patient to follow-up or seek medical care more urgently if needed.   - Time spent in direct consultation with patient on phone: 9 minutes  Nobie Putnam, [facility] 09/09/2018, 2:58 PM

## 2018-09-09 NOTE — Patient Instructions (Addendum)
Start Augmentin twice a day for 7 days for sinus and ear / throat concerns, possible bacterial infection.  It is possible to be viral cause as well, cannot rule this out today.  -------------------------- Also you have some Eustachian Tube Dysfunction, this problem is usually caused by some deeper sinus swelling and pressure, causing difficulty of eustachian tubes to clear fluid from behind ear drum. You can have ear pain, pressure, fullness, loss of hearing. Often related to sinus symptoms and sometimes with sinusitis or infection or allergy symptoms.  Treatment: - Start using nasal steroid spray, Flonase 2 sprays in each nostril every day for at least 4-6 weeks, and maybe longer - Start OTC allergy medicine (Claritin, Zyrtec, or Allegra - or generics) once daily - For significant ear/sinus pressure, try OTC decongestant such as Phenylephrine or similar medicines, included in DayQuil, take this for up to 1 week or less, no longer - AVOID Afrin nasal spray, if you use this do not use more than 3 days or can make sinus swelling worse  -------  [place] COVID19 Testing Information  I have placed an order in the [place] system.  All you need to do is arrive at a testing site. No appointment needed.  Hours (Open 8 a.m. - 3:45 p.m.) LAST TEST completed at 3:30pm  [place]: [facility] at [facility], [address] [place], [place]: [place], [place], [place], [place] (entrance off [address])  [place]: [place]. [address], [place], [place] (across from [facility])  Test result may take 2-7 days to result. You will be notified by MyChart or by Phone.  Phone: [phone] [facility] contact, can inquire about status of test result)  If negative test - they will call you with result. If abnormal or positive test you will be notified as well and our office will contact you to help  further with treatment plan.  May take Tylenol as needed for aches pains and fever. Prefer to avoid Ibuprofen if can help it, to avoid complication from virus.  REQUIRED self quarantine to [place] - advised to avoid all exposure with others while during treatment. Should continue to quarantine for up to 7-14 days, pending resolution of symptoms, if symptoms resolve by 7 days and is afebrile >3 days - may STOP self quarantine at that time.  If symptoms do not resolve or significantly improve OR if WORSENING - fever / cough - or worsening shortness of breath - then should contact us and seek advice on next steps in treatment at home vs where/when to seek care at Urgent Care or Hospital ED for further intervention   Please schedule a Follow-up Appointment to: Return in about 1 week (around 09/16/2018), or if symptoms worsen or fail to improve, for sinusitis.  If you have any other questions or concerns, please feel free to call the office or send a message through [place]. You may also schedule an earlier appointment if necessary.  Additionally, you may be receiving a survey about your experience at our office within a few days to 1 week by e-mail or mail. We value your feedback.  Nobie Putnam, DO [place]

## 2018-09-10 ENCOUNTER — Ambulatory Visit: Payer: Medicare HMO | Admitting: Family Medicine

## 2018-09-11 ENCOUNTER — Other Ambulatory Visit: Payer: Self-pay

## 2018-09-12 ENCOUNTER — Other Ambulatory Visit: Payer: Self-pay

## 2018-09-12 ENCOUNTER — Inpatient Hospital Stay: Payer: Medicare HMO | Attending: Internal Medicine

## 2018-09-12 DIAGNOSIS — D509 Iron deficiency anemia, unspecified: Secondary | ICD-10-CM | POA: Diagnosis present

## 2018-09-12 DIAGNOSIS — Z79899 Other long term (current) drug therapy: Secondary | ICD-10-CM | POA: Diagnosis not present

## 2018-09-12 DIAGNOSIS — M25552 Pain in left hip: Secondary | ICD-10-CM | POA: Diagnosis not present

## 2018-09-12 DIAGNOSIS — M533 Sacrococcygeal disorders, not elsewhere classified: Secondary | ICD-10-CM | POA: Diagnosis not present

## 2018-09-12 DIAGNOSIS — M48061 Spinal stenosis, lumbar region without neurogenic claudication: Secondary | ICD-10-CM | POA: Diagnosis not present

## 2018-09-12 DIAGNOSIS — G894 Chronic pain syndrome: Secondary | ICD-10-CM | POA: Diagnosis not present

## 2018-09-12 DIAGNOSIS — M545 Low back pain: Secondary | ICD-10-CM | POA: Diagnosis not present

## 2018-09-12 DIAGNOSIS — D508 Other iron deficiency anemias: Secondary | ICD-10-CM

## 2018-09-12 MED ORDER — SODIUM CHLORIDE 0.9 % IV SOLN
Freq: Once | INTRAVENOUS | Status: AC
  Administered 2018-09-12: 13:00:00 via INTRAVENOUS
  Filled 2018-09-12: qty 250

## 2018-09-12 MED ORDER — IRON SUCROSE 20 MG/ML IV SOLN
200.0000 mg | Freq: Once | INTRAVENOUS | Status: AC
  Administered 2018-09-12: 200 mg via INTRAVENOUS
  Filled 2018-09-12: qty 10

## 2018-09-21 ENCOUNTER — Other Ambulatory Visit: Payer: Self-pay | Admitting: Family Medicine

## 2018-09-21 DIAGNOSIS — R609 Edema, unspecified: Secondary | ICD-10-CM

## 2018-10-03 ENCOUNTER — Ambulatory Visit: Payer: Medicare HMO | Admitting: Podiatry

## 2018-10-06 ENCOUNTER — Ambulatory Visit: Payer: Medicare HMO | Admitting: Physician Assistant

## 2018-10-09 DIAGNOSIS — R7309 Other abnormal glucose: Secondary | ICD-10-CM

## 2018-10-10 MED ORDER — BLOOD GLUCOSE METER KIT: PACK | 0 refills | Status: DC

## 2018-10-10 NOTE — Telephone Encounter (Signed)
Verified pharmacy and order faxed to [facility], [place], [place]

## 2018-10-14 NOTE — Progress Notes (Signed)
Virtual Visit via Video Note   This visit type was conducted due to national recommendations for restrictions regarding the COVID-19 Pandemic (e.g. social distancing) in an effort to limit this patient's exposure and mitigate transmission in our community.  Due to her co-morbid illnesses, this patient is at least at moderate risk for complications without adequate follow up.  This format is felt to be most appropriate for this patient at this time.  All issues noted in this document were discussed and addressed.  A limited physical exam was performed with this format.  Please refer to the patient's chart for her consent to telehealth for [facility].   I connected with  Ashley Fields on 10/15/18 by a video enabled telemedicine application and verified that I am speaking with the correct person using two identifiers. I discussed the limitations of evaluation and management by telemedicine. The patient expressed understanding and agreed to proceed.   Evaluation Performed:  Follow-up visit  Date:  10/15/2018   ID:  Ashley Fields, [name] [birth year], MRN [id number]  Patient Location:  [place] [address]   Provider location:   [facility], [place] office  PCP:  Olin Hauser, DO  Cardiologist:  [facility]   Chief Complaint:  CAD   History of Present Illness:    Ashley Fields is a 63 y.o. female who presents via audio/video conferencing for a telehealth visit today.   The patient does not symptoms concerning for COVID-19 infection (fever, chills, cough, or new SHORTNESS OF BREATH).   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 , beef cattle, goats dogs 6 K steps a day Walks dogs Denies any anginal symptoms, no shortness of breath on exertion  Weight up 12 pounds  Iron infusions, to maintain iron levels Has not had significant anemia  Other past medical history reviewed Stress test 06/13/2017 Pharmacological myocardial perfusion imaging study with no significant  ischemia Normal wall motion, EF estimated at 83% Low risk scan  Remote smoking history for at least 15 years  CT chest  calcium score of 2400  aortic atherosclerosis noted "Coronary arteries: Marked LM and 3 vessel coronary calcification" Ascending Aorta: Calcific aortic atherosclerosis normal diameter 3.4 cm  minimal carotid disease bilaterally less than 39% bilaterally  Family history loss of her sister recently, due to cardiac arrest father died 61 MI Sister 21 died cardiac arrest   Prior CV studies:   The following studies were reviewed today:   Past Medical History:  Diagnosis Date  . Allergy   . Anemia   . Anxiety   . Arthritis    Yes  . Blood transfusion without reported diagnosis 1977   Transfusions from miscarriage & hemorrhaging  . Chronic kidney disease   . Depression   . Disp fx of cuboid bone of right foot, init for clos fx 01/01/2017  . GERD (gastroesophageal reflux disease)   . Headache   . Hepatitis C   . Hyperlipidemia   . Neuromuscular disorder ([place]) 1997   Falling to much was an eye-opener  . Osteoporosis   . Thyroid disease   .  Urine incontinence    Past Surgical History:  Procedure Laterality Date  . 5 miscarriages     1977-1985, Blood transfusion s/p miscarriage 1977  . ABDOMINAL HYSTERECTOMY  1987   Total  . APPENDECTOMY  12/2008  . AUGMENTATION MAMMAPLASTY Bilateral 2015   Bilat  . AUGMENTATION MAMMAPLASTY Bilateral 2018   implants redone w/ placement of implants under muscle  . breast lift bilateral, implants  44/01/270   bilateral, silicon  naturel  . BREAST REDUCTION SURGERY Bilateral 1997  . CESAREAN SECTION  07/27/1981   Placenta Previa  . CHOLECYSTECTOMY  03/2004   Lap surgery  . COSMETIC SURGERY     Breast implants w/ lift, tummy tuck, upper & lower Blepharop  . EYE SURGERY  2017   Cataract surgery & lasik  . GASTRIC BYPASS  2005   Laparoscopic  . IVC FILTER INSERTION  12/2003   TrapEase Vena Cava Filter  . LUMBAR LAMINECTOMY  06/2006   L4-L5 (spinal fusion)  . mini tummy tuck  05/07/2016   Bilateral bra/back roll lift skin removal  . neck surgery C3-C7  02/10/2015   ACDF  . REDUCTION MAMMAPLASTY Bilateral 1997  . SHOULDER ACROMIOPLASTY Left 03/27/2013   w/ labral debridement  . SMALL INTESTINE SURGERY  12/24/2003   Lap Gastric Bypass  . SPINAL CORD STIMULATOR IMPLANT  11/2010   removed 05/2011  . SPINE SURGERY  2008 2017   Fusion L4-L5, ACDF C4-C7      Allergies:   Flagyl [metronidazole], Cymbalta [duloxetine hcl], and Tape   Social History   Tobacco Use  . Smoking status: Former Smoker    Packs/day: 0.25    Years: 10.00    Pack years: 2.50    Types: Cigarettes    Quit date: 11/29/1995    Years since quitting: 22.8  . Smokeless tobacco: Former Systems developer  . Tobacco comment: Quite 1997, didnt smoke hardly at all when I was smoking.  Substance Use Topics  . Alcohol use: No  . Drug use: No     Current Outpatient Medications on File Prior to Visit  Medication Sig Dispense Refill  . ARIPiprazole (ABILIFY) 15 MG tablet TAKE ONE TABLET BY MOUTH DAILY  90 tablet 0  . aspirin 81 MG EC tablet TAKE 1 TABLET BY MOUTH EVERY DAY 90 tablet 3  . Biotin 10000 MCG TABS Take 10,000 mcg by mouth 1 day or 1 dose.    . blood glucose meter kit and supplies Dispense based on patient and insurance preference. Use up to four times daily as directed. (FOR ICD-10 E10.9, E11.9). 1 each 0  . estradiol (ESTRACE) 1 MG tablet TAKE 1 TABLET BY MOUTH EVERY DAY 90 tablet 1  . etodolac (LODINE) 500 MG tablet Take 1 tablet (500 mg  total) by mouth 2 (two) times daily. 180 tablet 1  . ezetimibe (ZETIA) 10 MG tablet TAKE ONE TABLET BY MOUTH ONE TIME DAILY  90 tablet 1  . FERROUS SULFATE-FOLIC ACID PO Take 1 tablet by mouth 2 (two) times daily.     . fluticasone (FLONASE) 50 MCG/ACT nasal spray Place 2 sprays into both nostrils daily. 48 g 3  . furosemide (LASIX) 20 MG tablet TAKE 1 TABLET (20 MG TOTAL) BY MOUTH DAILY AS NEEDED FOR FLUID OR EDEMA. 90 tablet 0  . gabapentin (NEURONTIN) 800 MG tablet Take 1 tablet (800 mg total) by mouth 3 (three) times daily. 270 tablet 1  . Melatonin 10 MG CAPS Take 2 capsules by mouth at bedtime.     Marland Kitchen  Multiple Vitamin (MULTIVITAMIN WITH MINERALS) TABS tablet Take 1 tablet by mouth daily.    Marland Kitchen omeprazole (PRILOSEC) 40 MG capsule Take 1 capsule (40 mg total) by mouth daily. 90 capsule 3  . ondansetron (ZOFRAN ODT) 4 MG disintegrating tablet Take 1 tablet (4 mg total) by mouth every 8 (eight) hours as needed for nausea or vomiting. 30 tablet 2  . oxyCODONE (OXY IR/ROXICODONE) 5 MG immediate release tablet TAKE 1 TABLET BY MOUTH EVERY 8 HOURS FILL ON DNF PRIOR TO 04/29/2018    . Phendimetrazine Tartrate 35 MG TABS phendimetrazine tartrate 35 mg tablet  Take 1 tablet 3 times a day by oral route.    . phentermine (ADIPEX-P) 37.5 MG tablet Take 37.5 mg by mouth daily.    . propranolol (INDERAL) 10 MG tablet TAKE 1 TABLET (10 MG TOTAL) BY MOUTH 3 (THREE) TIMES DAILY AS NEEDED. FOR ANXIETY ATTACKS 270 tablet 1  . rosuvastatin (CRESTOR) 40 MG tablet Take 1 tablet (40 mg total) by mouth daily. 90 tablet 3  . tiZANidine (ZANAFLEX) 4 MG tablet Take 1 tablet (4 mg total) by mouth 3 (three) times daily. (Patient taking differently: Take 4 mg by mouth 3 (three) times daily. ) 90 tablet 1  . traZODone (DESYREL) 150 MG tablet TAKE 1 TABLET (150 MG TOTAL) BY MOUTH AT BEDTIME. 90 tablet 1  . venlafaxine XR (EFFEXOR-XR) 37.5 MG 24 hr capsule TAKE ONE CAPSULE BY MOUTH DAILY with breakfast for anxiety/mood 30  capsule 2  . amoxicillin-clavulanate (AUGMENTIN) 875-125 MG tablet Take 1 tablet by mouth 2 (two) times daily. For 7 days 14 tablet 0  . methylPREDNISolone (MEDROL DOSEPAK) 4 MG TBPK tablet 6 day dose pack - take as directed (Patient not taking: Reported on 09/09/2018) 21 tablet 0   No current facility-administered medications on file prior to visit.      Family Hx: The patient's family history includes Arthritis in her brother, mother, and sister; COPD in her mother, sister, and sister; Cancer in her maternal grandfather and mother; Colon polyps in her sister; Depression in her sister, sister, and sister; Diabetes in her brother, brother, and mother; Early death in her father; Hearing loss in her sister; Heart attack in her brother, mother, and sister; Heart attack (age of onset: 71) in her father; Heart disease in her brother, brother, brother, brother, father, mother, and sister; Hypertension in her brother, brother, brother, mother, sister, sister, and sister; Lung cancer in her mother; Miscarriages / Stillbirths in her maternal aunt and paternal aunt; Obesity in her brother, brother, mother, sister, and sister; Stroke in her maternal grandfather and mother; Varicose Veins in her brother, mother, and sister. There is no history of Breast cancer.  ROS:   Please see the history of present illness.    Review of Systems  Constitutional: Negative.   HENT: Negative.   Respiratory: Negative.   Cardiovascular: Negative.   Gastrointestinal: Negative.   Musculoskeletal: Negative.   Neurological: Negative.   Psychiatric/Behavioral: Negative.   All other systems reviewed and are negative.    Labs/Other Tests and Data Reviewed:    Recent Labs: 08/11/2018: ALT 16; BUN 16; Creat 0.79; Hemoglobin 11.7; Magnesium 2.2; Platelets 164; Potassium 5.2; Sodium 144; TSH 0.86   Recent Lipid Panel Lab Results  Component Value Date/Time   CHOL 138 08/11/2018 08:04 AM   TRIG 69 08/11/2018 08:04 AM   HDL 79  08/11/2018 08:04 AM   CHOLHDL 1.7 08/11/2018 08:04 AM   LDLCALC 44 08/11/2018 08:04 AM  Wt Readings from Last 3 Encounters:  10/15/18 188 lb (85.3 kg)  08/18/18 197 lb (89.4 kg)  03/13/18 193 lb (87.5 kg)     Exam:    Vital Signs: Vital signs may also be detailed in the HPI Ht _0  (1.575 m)   Wt 188 lb (85.3 kg)   BMI 34.39 kg/m   Wt Readings from Last 3 Encounters:  10/15/18 188 lb (85.3 kg)  08/18/18 197 lb (89.4 kg)  03/13/18 193 lb (87.5 kg)   Temp Readings from Last 3 Encounters:  09/05/18 (!) 96 F (35.6 C) (Tympanic)  09/02/18 97.6 F (36.4 C)  08/29/18 (!) 95.5 F (35.3 C) (Tympanic)   BP Readings from Last 3 Encounters:  09/05/18 128/81  08/29/18 126/82  08/22/18 (!) 141/82   Pulse Readings from Last 3 Encounters:  09/05/18 91  08/29/18 81  08/22/18 85     Well nourished, well developed female in no acute distress. Constitutional:  oriented to person, place, and time. No distress.  Head: Normocephalic and atraumatic.  Eyes:  no discharge. No scleral icterus.  Neck: Normal range of motion. Neck supple.  Pulmonary/Chest: No audible wheezing, no distress, appears comfortable Musculoskeletal: Normal range of motion.  no  tenderness or deformity.  Neurological:   Coordination normal. Full exam not performed Skin:  No rash Psychiatric:  normal mood and affect. behavior is normal. Thought content normal.    ASSESSMENT & PLAN:    Problem List Items Addressed This Visit      Cardiology Problems   Mixed hyperlipidemia   Atherosclerosis of native coronary artery of native heart with stable angina pectoris (HCC) - Primary     Other   Smoker   Shortness of breath     Hollenhorst plaque, right eye -  Carotid with minimal bilateral disease significant aortic atherosclerosis  CAD with chronic stable angina Calcium score 2400 on  CT scan Negative stress test Continue Crestor 40 with Zetia 10 Discussed that if she were to have chest pain symptoms  we would likely proceed with cardiac catheterization given the severity of her calcifications Other tests were discussed as well such as cardiac CTA but this would be difficult given the level of calcification  Mixed hyperlipidemia Continue medications as detailed above Numbers discussed with her,  at goal  Smoker  Reports that she quit over 20 years ago Risk factor for atherosclerosis   COVID-19 Education: The signs and symptoms of COVID-19 were discussed with the patient and how to seek care for testing (follow up with PCP or arrange E-visit).  The importance of social distancing was discussed today.  Patient Risk:   After full review of this patients clinical status, I feel that they are at least moderate risk at this time.  Time:   Today, I have spent 25 minutes with the patient with telehealth technology discussing the cardiac and medical problems/diagnoses detailed above   Additional 10 min spent reviewing the chart prior to patient visit today   Medication Adjustments/Labs and Tests Ordered: Current medicines are reviewed at length with the patient today.  Concerns regarding medicines are outlined above.   Tests Ordered: No tests ordered   Medication Changes: No changes made  Disposition: Follow-up in 12 months   Signed, Ida Rogue, MD  [facility] [address], [place], [place] [address]

## 2018-10-15 ENCOUNTER — Telehealth (INDEPENDENT_AMBULATORY_CARE_PROVIDER_SITE_OTHER): Payer: Medicare HMO | Admitting: Cardiovascular Disease

## 2018-10-15 ENCOUNTER — Other Ambulatory Visit: Payer: Self-pay

## 2018-10-15 VITALS — Ht 62.0 in | Wt 188.0 lb

## 2018-10-15 DIAGNOSIS — F17200 Nicotine dependence, unspecified, uncomplicated: Secondary | ICD-10-CM | POA: Diagnosis not present

## 2018-10-15 DIAGNOSIS — I25118 Atherosclerotic heart disease of native coronary artery with other forms of angina pectoris: Secondary | ICD-10-CM

## 2018-10-15 DIAGNOSIS — E782 Mixed hyperlipidemia: Secondary | ICD-10-CM

## 2018-10-15 DIAGNOSIS — R69 Illness, unspecified: Secondary | ICD-10-CM | POA: Diagnosis not present

## 2018-10-15 DIAGNOSIS — R0602 Shortness of breath: Secondary | ICD-10-CM | POA: Diagnosis not present

## 2018-10-15 DIAGNOSIS — I739 Peripheral vascular disease, unspecified: Secondary | ICD-10-CM | POA: Diagnosis not present

## 2018-10-15 NOTE — Patient Instructions (Addendum)
Medication Instructions:  No changes  If you need a refill on your cardiac medications before your next appointment, please call your pharmacy.    Lab work: No new labs needed   If you have labs (blood work) drawn today and your tests are completely normal, you will receive your results only by: Marland Kitchen MyChart Message (if you have MyChart) OR . A paper copy in the mail If you have any lab test that is abnormal or we need to change your treatment, we will call you to review the results.   Testing/Procedures: No new testing needed   Follow-Up: At [facility], you and your health needs are our priority.  As part of our continuing mission to provide you with exceptional heart care, we have created designated Provider Care Teams.  These Care Teams include your primary Cardiologist (physician) and Advanced Practice Providers (APPs -  Physician Assistants and Nurse Practitioners) who all work together to provide you with the care you need, when you need it.  . You will need a follow up appointment in 12 months (October 2021) .   Please call our office 2 months in advance to schedule this appointment.  (Call in early August 2021 to schedule)  . Providers on your designated Care Team:   . Murray Hodgkins, NP . Christell Faith, PA-C . Marrianne Mood, PA-C  Any Other Special Instructions Will Be Listed Below (If Applicable).  For educational health videos Log in to : [email or web address] Or : [email or web address], password : [email or web address]

## 2018-10-22 ENCOUNTER — Encounter: Payer: Self-pay | Admitting: Podiatry

## 2018-10-23 ENCOUNTER — Other Ambulatory Visit: Payer: Self-pay

## 2018-10-23 ENCOUNTER — Encounter: Payer: Self-pay | Admitting: Internal Medicine

## 2018-10-23 ENCOUNTER — Ambulatory Visit (INDEPENDENT_AMBULATORY_CARE_PROVIDER_SITE_OTHER): Payer: Medicare HMO | Admitting: Psychiatry

## 2018-10-23 ENCOUNTER — Encounter: Payer: Self-pay | Admitting: Psychiatry

## 2018-10-23 DIAGNOSIS — R300 Dysuria: Secondary | ICD-10-CM

## 2018-10-23 DIAGNOSIS — R69 Illness, unspecified: Secondary | ICD-10-CM | POA: Diagnosis not present

## 2018-10-23 DIAGNOSIS — F410 Panic disorder [episodic paroxysmal anxiety] without agoraphobia: Secondary | ICD-10-CM | POA: Diagnosis not present

## 2018-10-23 DIAGNOSIS — F4310 Post-traumatic stress disorder, unspecified: Secondary | ICD-10-CM | POA: Diagnosis not present

## 2018-10-23 DIAGNOSIS — D508 Other iron deficiency anemias: Secondary | ICD-10-CM

## 2018-10-23 DIAGNOSIS — F3181 Bipolar II disorder: Secondary | ICD-10-CM

## 2018-10-23 MED ORDER — ARIPIPRAZOLE 20 MG PO TABS: 20.0000 mg | ORAL_TABLET | Freq: Every day | ORAL | 0 refills | Status: DC

## 2018-10-23 MED ORDER — VENLAFAXINE HCL ER 75 MG PO CP24: 75.0000 mg | ORAL_CAPSULE | Freq: Every day | ORAL | 0 refills | Status: DC

## 2018-10-23 NOTE — Progress Notes (Signed)
Virtual Visit via Video Note  I connected with Ashley Fields on 10/23/18 at 10:00 AM EDT by a video enabled telemedicine application and verified that I am speaking with the correct person using two identifiers.   I discussed the limitations of evaluation and management by telemedicine and the availability of in person appointments. The patient expressed understanding and agreed to proceed.  I discussed the assessment and treatment plan with the patient. The patient was provided an opportunity to ask questions and all were answered. The patient agreed with the plan and demonstrated an understanding of the instructions.   The patient was advised to call back or seek an in-person evaluation if the symptoms worsen or if the condition fails to improve as anticipated. [facility] [facility] MD OP Progress Note  10/23/2018 10:59 AM Ashley Fields  MRN:  [id number]  Chief Complaint:  Chief Complaint    Follow-up     HPI: Ashley Fields is a 63 year old Caucasian female, on disability, lives in [place], currently engaged, has a history of bipolar disorder, PTSD, chronic pain, hypoglycemic episodes was evaluated by telemedicine today.  Patient today reports she is currently struggling with some depressive symptoms.  She reports she feels sad often.  She reports she struggles with lack of motivation as well as anhedonia.  She reports she has a lot of property and dogs and other animals.  She reports she usually enjoys spending time with them and enjoys walking with her fianc and her dogs.  She however reports the past 3 weeks she does not enjoy anything anymore.  She does struggle with back pain however reports that is more under control and she is on medications for the same.  She reports she follows up with pain management.  She reports she does not have any suicidality or homicidality.  She denies any perceptual disturbances.  She reports she is compliant on her medications as prescribed.  She however does not know if the  medications are effective or not.  She denies side effects.  Patient reports she recently visited her son in [place].  She continues to call him on a regular basis and also has grandchildren.  She however does not have a lot of family around here.  Patient continues to decline psychotherapy referral due to financial problems. Visit Diagnosis:    ICD-10-CM   1. Bipolar 2 disorder, major depressive episode (HCC)  F31.81 venlafaxine XR (EFFEXOR XR) 75 MG 24 hr capsule    ARIPiprazole (ABILIFY) 20 MG tablet  2. PTSD (post-traumatic stress disorder)  F43.10 venlafaxine XR (EFFEXOR XR) 75 MG 24 hr capsule  3. Panic attacks  F41.0 venlafaxine XR (EFFEXOR XR) 75 MG 24 hr capsule    Past Psychiatric History: I have reviewed past psychiatric history from my progress note on 05/15/2018.  Past also Prozac, Wellbutrin, Lexapro, Celexa, Cymbalta, Rexulti.  Past Medical History:  Past Medical History:  Diagnosis Date  . Allergy   . Anemia   . Anxiety   . Arthritis    Yes  . Blood transfusion without reported diagnosis 1977   Transfusions from miscarriage & hemorrhaging  . Chronic kidney disease   . Depression   . Disp fx of cuboid bone of right foot, init for clos fx 01/01/2017  . GERD (gastroesophageal reflux disease)   . Headache   . Hepatitis C   . Hyperlipidemia   . Neuromuscular disorder ([place]) 1997   Falling to much was an eye-opener  . Osteoporosis   . Thyroid disease   . Urine  incontinence     Past Surgical History:  Procedure Laterality Date  . 5 miscarriages     1977-1985, Blood transfusion s/p miscarriage 1977  . ABDOMINAL HYSTERECTOMY  1987   Total  . APPENDECTOMY  12/2008  . AUGMENTATION MAMMAPLASTY Bilateral 2015   Bilat  . AUGMENTATION MAMMAPLASTY Bilateral 2018   implants redone w/ placement of implants under muscle  . breast lift bilateral, implants  26/71/2458   bilateral, silicon naturel  . BREAST REDUCTION SURGERY Bilateral 1997  . CESAREAN SECTION  07/27/1981    Placenta Previa  . CHOLECYSTECTOMY  03/2004   Lap surgery  . COSMETIC SURGERY     Breast implants w/ lift, tummy tuck, upper & lower Blepharop  . EYE SURGERY  2017   Cataract surgery & lasik  . GASTRIC BYPASS  2005   Laparoscopic  . IVC FILTER INSERTION  12/2003   TrapEase Vena Cava Filter  . LUMBAR LAMINECTOMY  06/2006   L4-L5 (spinal fusion)  . mini tummy tuck  05/07/2016   Bilateral bra/back roll lift skin removal  . neck surgery C3-C7  02/10/2015   ACDF  . REDUCTION MAMMAPLASTY Bilateral 1997  . SHOULDER ACROMIOPLASTY Left 03/27/2013   w/ labral debridement  . SMALL INTESTINE SURGERY  12/24/2003   Lap Gastric Bypass  . SPINAL CORD STIMULATOR IMPLANT  11/2010   removed 05/2011  . SPINE SURGERY  2008 2017   Fusion L4-L5, ACDF C4-C7    Family Psychiatric History: I have reviewed family psychiatric history from my progress note on 05/15/2018.  Family History:  Family History  Problem Relation Age of Onset  . COPD Mother   . Lung cancer Mother   . Diabetes Mother   . Heart disease Mother   . Stroke Mother   . Heart attack Mother   . Arthritis Mother   . Cancer Mother   . Hypertension Mother   . Obesity Mother   . Varicose Veins Mother   . Heart disease Father   . Heart attack Father 46  . Early death Father   . Depression Sister        x 5 sisters  . COPD Sister   . Hypertension Sister   . Heart disease Brother        x 3 brothers  . Diabetes Brother   . Colon polyps Sister   . Hearing loss Sister   . Heart attack Sister   . Heart attack Brother   . Arthritis Sister   . Varicose Veins Sister   . Arthritis Brother   . Heart disease Brother   . Hypertension Brother   . Cancer Maternal Grandfather   . Stroke Maternal Grandfather   . COPD Sister   . Obesity Sister   . Depression Sister   . Depression Sister   . Heart disease Sister   . Hypertension Sister   . Diabetes Brother   . Heart disease Brother   . Hypertension Brother   . Obesity Brother   .  Varicose Veins Brother   . Heart disease Brother   . Hypertension Brother   . Obesity Brother   . Hypertension Sister   . Obesity Sister   . Miscarriages / Stillbirths Maternal Aunt   . Miscarriages / Stillbirths Paternal Aunt   . Breast cancer Neg Hx     Social History: I have reviewed social history from my progress note on 05/15/2018. Social History   Socioeconomic History  . Marital status: Significant Other  Spouse name: Not on file  . Number of children: 1  . Years of education: [employer]  . Highest education level: Some college, no degree  Occupational History  . Not on file  Social Needs  . Financial resource strain: Not very hard  . Food insecurity    Worry: Never true    Inability: Never true  . Transportation needs    Medical: No    Non-medical: No  Tobacco Use  . Smoking status: Former Smoker    Packs/day: 0.25    Years: 10.00    Pack years: 2.50    Types: Cigarettes    Quit date: 11/29/1995    Years since quitting: 22.9  . Smokeless tobacco: Former Systems developer  . Tobacco comment: Quite 1997, didnt smoke hardly at all when I was smoking.  Substance and Sexual Activity  . Alcohol use: No  . Drug use: No  . Sexual activity: Yes    Birth control/protection: Condom, Post-menopausal, Surgical    Comment: Total Hysterectomy condoms  Lifestyle  . Physical activity    Days per week: 5 days    Minutes per session: 20 min  . Stress: Not on file  Relationships  . Social Herbalist on phone: Not on file    Gets together: Not on file    Attends religious service: Never    Active member of club or organization: No    Attends meetings of clubs or organizations: Never    Relationship status: Widowed  Other Topics Concern  . 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 Flagyl [Metronidazole] Anaphylaxis   . Cymbalta [Duloxetine Hcl]   . Tape Rash    Metabolic Disorder Labs: Lab Results  Component Value Date   HGBA1C 5.2 08/11/2018   MPG 103 08/11/2018   MPG 94 07/30/2017   No results found for: PROLACTIN Lab Results  Component Value Date   CHOL 138 08/11/2018   TRIG 69 08/11/2018   HDL 79 08/11/2018   CHOLHDL 1.7 08/11/2018   LDLCALC 44 08/11/2018   LDLCALC 38 03/10/2018   Lab Results  Component Value Date   TSH 0.86 08/11/2018   TSH 1.88 07/30/2017    Therapeutic Level Labs: No results found for: LITHIUM No results found for: VALPROATE No components found for:  CBMZ  Current Medications: Current Outpatient Medications  Medication Sig Dispense Refill  . ARIPiprazole (ABILIFY) 20 MG tablet Take 1 tablet (20 mg total) by mouth daily. 90 tablet 0  . aspirin 81 MG EC tablet TAKE 1 TABLET BY MOUTH EVERY DAY 90 tablet 3  . Biotin 10000 MCG TABS Take 10,000 mcg by mouth 1 day or 1 dose.    . blood glucose meter kit and supplies Dispense based on patient and insurance preference. Use up to four times daily as directed. (FOR ICD-10 E10.9, E11.9). 1 each 0  . estradiol (ESTRACE) 1 MG tablet TAKE 1 TABLET BY MOUTH EVERY DAY 90 tablet 1  . etodolac (LODINE) 500 MG tablet Take 1 tablet (500 mg total) by mouth 2 (two) times daily. 180 tablet 1  . ezetimibe (ZETIA) 10 MG tablet TAKE ONE TABLET BY MOUTH ONE TIME DAILY  90 tablet 1  . gabapentin (NEURONTIN) 800 MG tablet Take 1 tablet (800 mg total) by mouth 3 (three) times daily. 270 tablet 1  .  Melatonin 10 MG CAPS Take 2 capsules by mouth at bedtime.     . Multiple Vitamin (MULTIVITAMIN WITH MINERALS) TABS tablet Take 1 tablet by mouth daily.    Marland Kitchen oxyCODONE (OXY IR/ROXICODONE) 5 MG immediate release tablet TAKE 1 TABLET BY MOUTH EVERY 8 HOURS FILL ON DNF PRIOR TO 04/29/2018    . propranolol (INDERAL) 10 MG tablet TAKE 1 TABLET (10 MG TOTAL) BY MOUTH 3 (THREE) TIMES DAILY AS NEEDED. FOR ANXIETY ATTACKS 270 tablet 1  . traZODone  (DESYREL) 150 MG tablet TAKE 1 TABLET (150 MG TOTAL) BY MOUTH AT BEDTIME. 90 tablet 1  . venlafaxine XR (EFFEXOR XR) 75 MG 24 hr capsule Take 1 capsule (75 mg total) by mouth daily with breakfast. 90 capsule 0  . amoxicillin-clavulanate (AUGMENTIN) 875-125 MG tablet Take 1 tablet by mouth 2 (two) times daily. For 7 days 14 tablet 0  . FERROUS SULFATE-FOLIC ACID PO Take 1 tablet by mouth 2 (two) times daily.     . fluticasone (FLONASE) 50 MCG/ACT nasal spray Place 2 sprays into both nostrils daily. 48 g 3  . furosemide (LASIX) 20 MG tablet TAKE 1 TABLET (20 MG TOTAL) BY MOUTH DAILY AS NEEDED FOR FLUID OR EDEMA. 90 tablet 0  . omeprazole (PRILOSEC) 40 MG capsule Take 1 capsule (40 mg total) by mouth daily. 90 capsule 3  . ondansetron (ZOFRAN ODT) 4 MG disintegrating tablet Take 1 tablet (4 mg total) by mouth every 8 (eight) hours as needed for nausea or vomiting. 30 tablet 2  . phentermine (ADIPEX-P) 37.5 MG tablet Take 37.5 mg by mouth daily.    . rosuvastatin (CRESTOR) 40 MG tablet Take 1 tablet (40 mg total) by mouth daily. 90 tablet 3  . tiZANidine (ZANAFLEX) 4 MG tablet Take 1 tablet (4 mg total) by mouth 3 (three) times daily. (Patient taking differently: Take 4 mg by mouth 3 (three) times daily. ) 90 tablet 1   No current facility-administered medications for this visit.      Musculoskeletal: Strength & Muscle Tone: UTA Gait & Station: normal Patient leans: N/A  Psychiatric Specialty Exam: Review of Systems  Musculoskeletal: Positive for back pain.  Psychiatric/Behavioral: Positive for depression.  All other systems reviewed and are negative.   There were no vitals taken for this visit.There is no height or weight on file to calculate BMI.  General Appearance: Casual  Eye Contact:  Fair  Speech:  Normal Rate  Volume:  Normal  Mood:  Depressed  Affect:  Appropriate  Thought Process:  Goal Directed and Descriptions of Associations: Intact  Orientation:  Full (Time, Place, and  Person)  Thought Content: Logical   Suicidal Thoughts:  No  Homicidal Thoughts:  No  Memory:  Immediate;   Fair Recent;   Fair Remote;   Fair  Judgement:  Fair  Insight:  Fair  Psychomotor Activity:  Normal  Concentration:  Concentration: Fair and Attention Span: Fair  Recall:  [employer] of Knowledge: Fair  Language: Fair  Akathisia:  No  Handed:  Right  AIMS (if indicated): Denies tremors, rigidity  Assets:  Communication Skills Desire for Improvement Housing Social Support  ADL's:  Intact  Cognition: WNL  Sleep:  Fair   Screenings: GAD-7     Office Visit from 08/18/2018 in [facility] Office Visit from 07/22/2018 in [facility] Office Visit from 03/13/2018 in [facility] Office Visit from 12/12/2017 in [facility]2017 [facility]  Total GAD-7 Score  9  8  5  6  3     PHQ2-9     Office Visit from 09/09/2018 in [facility] Office Visit from 08/18/2018 in [facility] Office Visit from 07/22/2018 in [facility] Office Visit from 03/13/2018 in [facility] Office Visit from 12/12/2017 in [place]  PHQ-2 Total Score  0  2  0  1  1  PHQ-9 Total Score  3  11  2  4  3        Assessment and Plan: Ashley Fields is a 64 year old Caucasian female, disability, engaged, lives in [name], has a history of PTSD, mood lability, panic attacks, chronic pain was evaluated by telemedicine today.  Patient is biologically predisposed given her family history as well as her own medical problems.  Patient with psychosocial stressors of multiple medical problems and chronic pain.  Patient continues to struggle with depression.  Plan as noted below.  Plan Bipolar disorder type II-unstable Increase Abilify to 20 mg p.o. daily Venlafaxine XR, increased to 75 mg p.o. daily with breakfast  PTSD-improving Effexor as prescribed Trazodone  150 mg p.o. nightly Melatonin 10 mg p.o. daily  Panic attacks-improving Propranolol 10 mg p.o. 3 times daily as needed for anxiety attacks  Patient continues to decline psychotherapy referral.  Follow-up in clinic in 3 weeks or sooner if needed.  Patient does have an appointment scheduled on November 6.  I have spent atleast 15 minutes non  face to face with patient today. More than 50 % of the time was spent for psychoeducation and supportive psychotherapy and care coordination. This note was generated in part or whole with voice recognition software. Voice recognition is usually quite accurate but there are transcription errors that can and very often do occur. I apologize for any typographical errors that were not detected and corrected.       Ursula Alert, MD 10/23/2018, 10:59 AM

## 2018-10-23 NOTE — Progress Notes (Signed)
Pre-visit assessment completed prior to [place] appointment with Dr. Rogue Bussing on 10/24/2018. Pt c/o recurring fatigue. She did feel better after her infusions for about 4-5 weeks, then the fatigue returned. Otherwise, no concerns.

## 2018-10-24 ENCOUNTER — Inpatient Hospital Stay: Payer: Medicare HMO | Admitting: Internal Medicine

## 2018-10-24 ENCOUNTER — Inpatient Hospital Stay: Payer: Medicare HMO

## 2018-10-24 ENCOUNTER — Other Ambulatory Visit: Payer: Self-pay

## 2018-10-24 ENCOUNTER — Inpatient Hospital Stay: Payer: Medicare HMO | Attending: Internal Medicine

## 2018-10-24 DIAGNOSIS — D696 Thrombocytopenia, unspecified: Secondary | ICD-10-CM | POA: Insufficient documentation

## 2018-10-24 DIAGNOSIS — E559 Vitamin D deficiency, unspecified: Secondary | ICD-10-CM | POA: Insufficient documentation

## 2018-10-24 DIAGNOSIS — E538 Deficiency of other specified B group vitamins: Secondary | ICD-10-CM | POA: Insufficient documentation

## 2018-10-24 DIAGNOSIS — R300 Dysuria: Secondary | ICD-10-CM

## 2018-10-24 DIAGNOSIS — D508 Other iron deficiency anemias: Secondary | ICD-10-CM | POA: Diagnosis not present

## 2018-10-24 DIAGNOSIS — D509 Iron deficiency anemia, unspecified: Secondary | ICD-10-CM | POA: Insufficient documentation

## 2018-10-24 LAB — URINALYSIS, COMPLETE (UACMP) WITH MICROSCOPIC
Glucose, UA: NEGATIVE mg/dL
Hgb urine dipstick: NEGATIVE
Ketones, ur: NEGATIVE mg/dL
Nitrite: NEGATIVE
Protein, ur: NEGATIVE mg/dL
Specific Gravity, Urine: 1.014 (ref 1.005–1.030)
pH: 5 (ref 5.0–8.0)

## 2018-10-24 LAB — CBC WITH DIFFERENTIAL/PLATELET
Abs Immature Granulocytes: 0.01 10*3/uL (ref 0.00–0.07)
Basophils Absolute: 0 10*3/uL (ref 0.0–0.1)
Basophils Relative: 1 %
Eosinophils Absolute: 0.2 10*3/uL (ref 0.0–0.5)
Eosinophils Relative: 4 %
HCT: 42.7 % (ref 36.0–46.0)
Hemoglobin: 13.8 g/dL (ref 12.0–15.0)
Immature Granulocytes: 0 %
Lymphocytes Relative: 32 %
Lymphs Abs: 1.5 10*3/uL (ref 0.7–4.0)
MCH: 28.5 pg (ref 26.0–34.0)
MCHC: 32.3 g/dL (ref 30.0–36.0)
MCV: 88 fL (ref 80.0–100.0)
Monocytes Absolute: 0.3 10*3/uL (ref 0.1–1.0)
Monocytes Relative: 7 %
Neutro Abs: 2.7 10*3/uL (ref 1.7–7.7)
Neutrophils Relative %: 56 %
Platelets: 146 10*3/uL — ABNORMAL LOW (ref 150–400)
RBC: 4.85 MIL/uL (ref 3.87–5.11)
RDW: 14.9 % (ref 11.5–15.5)
WBC: 4.6 10*3/uL (ref 4.0–10.5)
nRBC: 0 % (ref 0.0–0.2)

## 2018-10-24 LAB — LACTATE DEHYDROGENASE: LDH: 161 U/L (ref 98–192)

## 2018-10-24 LAB — VITAMIN B12: Vitamin B-12: 547 pg/mL (ref 180–914)

## 2018-10-24 NOTE — Progress Notes (Signed)
[facility] CONSULT NOTE  Patient Care Team: Olin Hauser, DO as PCP - General (Family Medicine)  CHIEF COMPLAINTS/PURPOSE OF CONSULTATION: Iron deficiency   HEMATOLOGY HISTORY  # ANEMIA IRON DEFICIENCY GASTRIC BYPASS [2005 [place], [place]] EGD-none; colonoscopy-2015- few polyps snared [[place]]  HISTORY OF PRESENTING ILLNESS:  Ashley Fields 63 y.o.  female history of iron deficiency secondary to gastric bypass is here for follow-up.  Patient is currently status post IV Venofer x3.  Patient noted significant provement of her energy levels.   Review of Systems  Constitutional: Positive for malaise/fatigue. Negative for chills, diaphoresis, fever and weight loss.  HENT: Negative for nosebleeds and sore throat.   Eyes: Negative for double vision.  Respiratory: Positive for shortness of breath. Negative for cough, hemoptysis, sputum production and wheezing.   Cardiovascular: Negative for chest pain, palpitations, orthopnea and leg swelling.  Gastrointestinal: Negative for abdominal pain, blood in stool, constipation, diarrhea, heartburn, melena, nausea and vomiting.  Genitourinary: Negative for dysuria, frequency and urgency.  Musculoskeletal: Negative for back pain and joint pain.  Skin: Negative.  Negative for itching and rash.  Neurological: Negative for dizziness, tingling, focal weakness, weakness and headaches.  Endo/Heme/Allergies: Does not bruise/bleed easily.  Psychiatric/Behavioral: Negative for depression. The patient is not nervous/anxious and does not have insomnia.     MEDICAL HISTORY:  Past Medical History:  Diagnosis Date  . Allergy   . Anemia   . Anxiety   . Arthritis    Yes  . Blood transfusion without reported diagnosis 1977   Transfusions from miscarriage & hemorrhaging  . Chronic kidney disease   . Depression   . Disp fx of cuboid bone of right foot, init for clos fx 01/01/2017  . GERD (gastroesophageal reflux disease)   . Headache    . Hepatitis C   . Hyperlipidemia   . Neuromuscular disorder ([place]) 1997   Falling to much was an eye-opener  . Osteoporosis   . Thyroid disease   . Urine incontinence     SURGICAL HISTORY: Past Surgical History:  Procedure Laterality Date  . 5 miscarriages     1977-1985, Blood transfusion s/p miscarriage 1977  . ABDOMINAL HYSTERECTOMY  1987   Total  . APPENDECTOMY  12/2008  . AUGMENTATION MAMMAPLASTY Bilateral 2015   Bilat  . AUGMENTATION MAMMAPLASTY Bilateral 2018   implants redone w/ placement of implants under muscle  . breast lift bilateral, implants  99/24/2683   bilateral, silicon naturel  . BREAST REDUCTION SURGERY Bilateral 1997  . CESAREAN SECTION  07/27/1981   Placenta Previa  . CHOLECYSTECTOMY  03/2004   Lap surgery  . COSMETIC SURGERY     Breast implants w/ lift, tummy tuck, upper & lower Blepharop  . EYE SURGERY  2017   Cataract surgery & lasik  . GASTRIC BYPASS  2005   Laparoscopic  . IVC FILTER INSERTION  12/2003   TrapEase Vena Cava Filter  . LUMBAR LAMINECTOMY  06/2006   L4-L5 (spinal fusion)  . mini tummy tuck  05/07/2016   Bilateral bra/back roll lift skin removal  . neck surgery C3-C7  02/10/2015   ACDF  . REDUCTION MAMMAPLASTY Bilateral 1997  . SHOULDER ACROMIOPLASTY Left 03/27/2013   w/ labral debridement  . SMALL INTESTINE SURGERY  12/24/2003   Lap Gastric Bypass  . SPINAL CORD STIMULATOR IMPLANT  11/2010   removed 05/2011  . SPINE SURGERY  2008 2017   Fusion L4-L5, ACDF C4-C7    SOCIAL HISTORY: Social History   Socioeconomic  History  . Marital status: Significant Other    Spouse name: Not on file  . Number of children: 1  . Years of education: [employer]  . Highest education level: Some college, no degree  Occupational History  . Not on file  Social Needs  . Financial resource strain: Not very hard  . Food insecurity    Worry: Never true    Inability: Never true  . Transportation needs    Medical: No    Non-medical: No   Tobacco Use  . Smoking status: Former Smoker    Packs/day: 0.25    Years: 10.00    Pack years: 2.50    Types: Cigarettes    Quit date: 11/29/1995    Years since quitting: 22.9  . Smokeless tobacco: Former Systems developer  . Tobacco comment: Quite 1997, didnt smoke hardly at all when I was smoking.  Substance and Sexual Activity  . Alcohol use: No  . Drug use: No  . Sexual activity: Yes    Birth control/protection: Condom, Post-menopausal, Surgical    Comment: Total Hysterectomy condoms  Lifestyle  . Physical activity    Days per week: 5 days    Minutes per session: 20 min  . Stress: Not on file  Relationships  . Social Herbalist on phone: Not on file    Gets together: Not on file    Attends religious service: Never    Active member of club or organization: No    Attends meetings of clubs or organizations: Never    Relationship status: Widowed  . Intimate partner violence    Fear of current or ex partner: No    Emotionally abused: No    Physically abused: No    Forced sexual activity: No  Other Topics Concern  . 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 COPD Mother   . Lung cancer Mother   . Diabetes Mother   . Heart disease Mother   . Stroke Mother   . Heart attack Mother   . Arthritis Mother   . Cancer Mother   . Hypertension Mother   . Obesity Mother   . Varicose Veins Mother   . Heart disease Father   . Heart attack Father 19  . Early death Father   . Depression Sister        x 5 sisters  . COPD Sister   . Hypertension Sister   . Heart disease Brother        x 3 brothers  . Diabetes Brother   . Colon polyps Sister   . Hearing loss Sister   . Heart attack Sister   . Heart attack Brother   . Arthritis Sister   . Varicose Veins Sister   . Arthritis Brother   . Heart disease  Brother   . Hypertension Brother   . Cancer Maternal Grandfather   . Stroke Maternal Grandfather   . COPD Sister   . Obesity Sister   . Depression Sister   . Depression Sister   . Heart disease Sister   . Hypertension Sister   . Diabetes Brother   . Heart disease Brother   . Hypertension Brother   . Obesity Brother   . Varicose Veins Brother   . Heart disease Brother   . Hypertension Brother   .  Obesity Brother   . Hypertension Sister   . Obesity Sister   . Miscarriages / Stillbirths Maternal Aunt   . Miscarriages / Stillbirths Paternal Aunt   . Breast cancer Neg Hx     ALLERGIES:  is allergic to flagyl [metronidazole]; cymbalta [duloxetine hcl]; and tape.  MEDICATIONS:  Current Outpatient Medications  Medication Sig Dispense Refill  . ARIPiprazole (ABILIFY) 20 MG tablet Take 1 tablet (20 mg total) by mouth daily. 90 tablet 0  . aspirin 81 MG EC tablet TAKE 1 TABLET BY MOUTH EVERY DAY 90 tablet 3  . Biotin 10000 MCG TABS Take 10,000 mcg by mouth 1 day or 1 dose.    . blood glucose meter kit and supplies Dispense based on patient and insurance preference. Use up to four times daily as directed. (FOR ICD-10 E10.9, E11.9). 1 each 0  . estradiol (ESTRACE) 1 MG tablet TAKE 1 TABLET BY MOUTH EVERY DAY 90 tablet 1  . etodolac (LODINE) 500 MG tablet Take 1 tablet (500 mg total) by mouth 2 (two) times daily. 180 tablet 1  . ezetimibe (ZETIA) 10 MG tablet TAKE ONE TABLET BY MOUTH ONE TIME DAILY  90 tablet 1  . FERROUS SULFATE-FOLIC ACID PO Take 1 tablet by mouth 2 (two) times daily.     . fluticasone (FLONASE) 50 MCG/ACT nasal spray Place 2 sprays into both nostrils daily. 48 g 3  . furosemide (LASIX) 20 MG tablet TAKE 1 TABLET (20 MG TOTAL) BY MOUTH DAILY AS NEEDED FOR FLUID OR EDEMA. 90 tablet 0  . gabapentin (NEURONTIN) 800 MG tablet Take 1 tablet (800 mg total) by mouth 3 (three) times daily. 270 tablet 1  . Melatonin 10 MG CAPS Take 2 capsules by mouth at bedtime.     . Multiple  Vitamin (MULTIVITAMIN WITH MINERALS) TABS tablet Take 1 tablet by mouth daily.    Marland Kitchen omeprazole (PRILOSEC) 40 MG capsule Take 1 capsule (40 mg total) by mouth daily. 90 capsule 3  . ondansetron (ZOFRAN ODT) 4 MG disintegrating tablet Take 1 tablet (4 mg total) by mouth every 8 (eight) hours as needed for nausea or vomiting. 30 tablet 2  . oxyCODONE (OXY IR/ROXICODONE) 5 MG immediate release tablet TAKE 1 TABLET BY MOUTH EVERY 8 HOURS FILL ON DNF PRIOR TO 04/29/2018    . phentermine (ADIPEX-P) 37.5 MG tablet Take 37.5 mg by mouth daily.    . propranolol (INDERAL) 10 MG tablet TAKE 1 TABLET (10 MG TOTAL) BY MOUTH 3 (THREE) TIMES DAILY AS NEEDED. FOR ANXIETY ATTACKS 270 tablet 1  . rosuvastatin (CRESTOR) 40 MG tablet Take 1 tablet (40 mg total) by mouth daily. 90 tablet 3  . tiZANidine (ZANAFLEX) 4 MG tablet Take 1 tablet (4 mg total) by mouth 3 (three) times daily. (Patient taking differently: Take 4 mg by mouth 3 (three) times daily. ) 90 tablet 1  . traZODone (DESYREL) 150 MG tablet TAKE 1 TABLET (150 MG TOTAL) BY MOUTH AT BEDTIME. 90 tablet 1  . venlafaxine XR (EFFEXOR XR) 75 MG 24 hr capsule Take 1 capsule (75 mg total) by mouth daily with breakfast. 90 capsule 0  . amoxicillin-clavulanate (AUGMENTIN) 875-125 MG tablet Take 1 tablet by mouth 2 (two) times daily. For 7 days 14 tablet 0   No current facility-administered medications for this visit.       PHYSICAL EXAMINATION:   Vitals:   10/24/18 1053  BP: 130/74  Pulse: 69  Temp: (!) 96.2 F (35.7 C)   Filed Weights  10/24/18 1053  Weight: 190 lb (86.2 kg)    Physical Exam  Constitutional: She is oriented to person, place, and time and well-developed, well-nourished, and in no distress.  HENT:  Head: Normocephalic and atraumatic.  Mouth/Throat: Oropharynx is clear and moist. No oropharyngeal exudate.  Eyes: Pupils are equal, round, and reactive to light.  Neck: Normal range of motion. Neck supple.  Cardiovascular: Normal rate  and regular rhythm.  Pulmonary/Chest: Effort normal and breath sounds normal. No respiratory distress. She has no wheezes.  Abdominal: Soft. Bowel sounds are normal. She exhibits no distension and no mass. There is no abdominal tenderness. There is no rebound and no guarding.  Musculoskeletal: Normal range of motion.        General: No tenderness or edema.  Neurological: She is alert and oriented to person, place, and time.  Skin: Skin is warm.  Psychiatric: Affect normal.    LABORATORY DATA:  I have reviewed the data as listed Lab Results  Component Value Date   WBC 4.6 10/24/2018   HGB 13.8 10/24/2018   HCT 42.7 10/24/2018   MCV 88.0 10/24/2018   PLT 146 (L) 10/24/2018   Recent Labs    03/10/18 1111 08/11/18 0804  NA 143 144  K 5.2 5.2  CL 107 108  CO2 30 29  GLUCOSE 100* 95  BUN 17 16  CREATININE 0.93 0.79  CALCIUM 9.2 9.0  GFRNONAA 66 80  GFRAA 77 93  PROT 6.8 6.5  AST 28 22  ALT 21 16  BILITOT 0.5 0.7     No results found.  Acquired iron deficiency anemia due to decreased absorption #Iron deficiency-saturation-11% ferritin 7; hemoglobin 11-secondary to gastric malabsorption status post gastric bypass.  S/p IV Venofer x3 significant improvement in the fatigue.  #B12 malabsorption-recent B12 levels normal; oral B12 [intolerance to SL]; awaiting B12.  #Vitamin D deficiency-malabsorption;on vitamin D 5000 units.  Stable.  #Mild thrombocytopenia-146 question ITP versus others.  Inconsequential.  Monitor for now  # DISPOSITION:  # HOLD VENOFER TODAY # follow up in 3 months MD- labs-cbc/possible VENOFER- Dr.B    All questions were answered. The patient knows to call the clinic with any problems, questions or concerns.      Cammie Sickle, MD 10/24/2018 12:20 PM

## 2018-10-24 NOTE — Assessment & Plan Note (Addendum)
#  Iron deficiency-saturation-11% ferritin 7; hemoglobin 11-secondary to gastric malabsorption status post gastric bypass.  S/p IV Venofer x3 significant improvement in the fatigue.  #B12 malabsorption-recent B12 levels normal; oral B12 [intolerance to SL]; awaiting B12.  #Vitamin D deficiency-malabsorption;on vitamin D 5000 units.  Stable.  #Mild thrombocytopenia-146 question ITP versus others.  Inconsequential.  Monitor for now  # DISPOSITION:  # HOLD VENOFER TODAY # follow up in 3 months MD- labs-cbc/possible VENOFER- Dr.B

## 2018-10-31 ENCOUNTER — Ambulatory Visit: Payer: Medicare HMO | Admitting: Podiatry

## 2018-10-31 ENCOUNTER — Encounter: Payer: Self-pay | Admitting: Podiatry

## 2018-11-01 DIAGNOSIS — Z20828 Contact with and (suspected) exposure to other viral communicable diseases: Secondary | ICD-10-CM | POA: Diagnosis not present

## 2018-11-02 ENCOUNTER — Other Ambulatory Visit: Payer: Self-pay | Admitting: Family Medicine

## 2018-11-02 DIAGNOSIS — K219 Gastro-esophageal reflux disease without esophagitis: Secondary | ICD-10-CM

## 2018-11-03 ENCOUNTER — Other Ambulatory Visit: Payer: Self-pay | Admitting: Family Medicine

## 2018-11-03 ENCOUNTER — Other Ambulatory Visit: Payer: Self-pay | Admitting: *Deleted

## 2018-11-03 DIAGNOSIS — J3089 Other allergic rhinitis: Secondary | ICD-10-CM

## 2018-11-03 MED ORDER — ROSUVASTATIN CALCIUM 40 MG PO TABS: 40.0000 mg | ORAL_TABLET | Freq: Every day | ORAL | 0 refills | Status: DC

## 2018-11-07 DIAGNOSIS — G894 Chronic pain syndrome: Secondary | ICD-10-CM | POA: Diagnosis not present

## 2018-11-07 DIAGNOSIS — Z79899 Other long term (current) drug therapy: Secondary | ICD-10-CM | POA: Diagnosis not present

## 2018-11-07 DIAGNOSIS — M48061 Spinal stenosis, lumbar region without neurogenic claudication: Secondary | ICD-10-CM | POA: Diagnosis not present

## 2018-11-07 DIAGNOSIS — M533 Sacrococcygeal disorders, not elsewhere classified: Secondary | ICD-10-CM | POA: Diagnosis not present

## 2018-11-07 DIAGNOSIS — M25552 Pain in left hip: Secondary | ICD-10-CM | POA: Diagnosis not present

## 2018-11-07 DIAGNOSIS — M545 Low back pain: Secondary | ICD-10-CM | POA: Diagnosis not present

## 2018-11-13 NOTE — Progress Notes (Signed)
Virtual Visit via Video Note  I connected with Ashley Fields on 11/14/18 at 10:00 AM EST by a video enabled telemedicine application and verified that I am speaking with the correct person using two identifiers.   I discussed the limitations of evaluation and management by telemedicine and the availability of in person appointments. The patient expressed understanding and agreed to proceed.    I discussed the assessment and treatment plan with the patient. The patient was provided an opportunity to ask questions and all were answered. The patient agreed with the plan and demonstrated an understanding of the instructions.   The patient was advised to call back or seek an in-person evaluation if the symptoms worsen or if the condition fails to improve as anticipated.   [place] MD OP Progress Note  11/14/2018 12:37 PM Ashley Fields  MRN:  [id number]  Chief Complaint:  Chief Complaint    Follow-up     HPI: Ashley Fields is a 63 year old Caucasian female, on disability, lives in [place], currently engaged, has a history of bipolar disorder, PTSD, chronic pain, hypoglycemic episodes was evaluated by telemedicine today.  Patient today reports she is currently making progress on the Abilify and venlafaxine.  Her dosages were increased last visit.  She reports her depressive symptoms as improved.  She reports sleep is good.  Patient denies any suicidality, homicidality or perceptual disturbances.  She has been sleeping okay.  Appetite continues to be average.  She however has been encouraging herself to eat better.  Patient reports she has been able to get out and do more activities, take care of the animals as well as go out for walks with her dog.  Patient denies any other concerns today.       Visit Diagnosis:    ICD-10-CM   1. Bipolar 2 disorder, major depressive episode ([place])  F31.81    improving  2. PTSD (post-traumatic stress disorder)  F43.10    improving  3. Panic attacks  F41.0     stable    Past Psychiatric History: I have reviewed past psychiatric history from my progress note on 05/15/2018.  Past trials of Prozac, Wellbutrin, Lexapro, Cymbalta, Rexulti.     Past Medical History:  Past Medical History:  Diagnosis Date  . Allergy   . Anemia   . Anxiety   . Arthritis    Yes  . Blood transfusion without reported diagnosis 1977   Transfusions from miscarriage & hemorrhaging  . Chronic kidney disease   . Depression   . Disp fx of cuboid bone of right foot, init for clos fx 01/01/2017  . GERD (gastroesophageal reflux disease)   . Headache   . Hepatitis C   . Hyperlipidemia   . Neuromuscular disorder ([place]) 1997   Falling to much was an eye-opener  . Osteoporosis   . Thyroid disease   . Urine incontinence     Past Surgical History:  Procedure Laterality Date  . 5 miscarriages     1977-1985, Blood transfusion s/p miscarriage 1977  . ABDOMINAL HYSTERECTOMY  1987   Total  . APPENDECTOMY  12/2008  . AUGMENTATION MAMMAPLASTY Bilateral 2015   Bilat  . AUGMENTATION MAMMAPLASTY Bilateral 2018   implants redone w/ placement of implants under muscle  . breast lift bilateral, implants  13/24/4010   bilateral, silicon naturel  . BREAST REDUCTION SURGERY Bilateral 1997  . CESAREAN SECTION  07/27/1981   Placenta Previa  . CHOLECYSTECTOMY  03/2004   Lap surgery  . COSMETIC SURGERY  Breast implants w/ lift, tummy tuck, upper & lower Blepharop  . EYE SURGERY  2017   Cataract surgery & lasik  . GASTRIC BYPASS  2005   Laparoscopic  . IVC FILTER INSERTION  12/2003   TrapEase Vena Cava Filter  . LUMBAR LAMINECTOMY  06/2006   L4-L5 (spinal fusion)  . mini tummy tuck  05/07/2016   Bilateral bra/back roll lift skin removal  . neck surgery C3-C7  02/10/2015   ACDF  . REDUCTION MAMMAPLASTY Bilateral 1997  . SHOULDER ACROMIOPLASTY Left 03/27/2013   w/ labral debridement  . SMALL INTESTINE SURGERY  12/24/2003   Lap Gastric Bypass  . SPINAL CORD STIMULATOR  IMPLANT  11/2010   removed 05/2011  . SPINE SURGERY  2008 2017   Fusion L4-L5, ACDF C4-C7    Family Psychiatric History: Reviewed family psychiatric history from my progress note on 05/15/2018  Family History:  Family History  Problem Relation Age of Onset  . COPD Mother   . Lung cancer Mother   . Diabetes Mother   . Heart disease Mother   . Stroke Mother   . Heart attack Mother   . Arthritis Mother   . Cancer Mother   . Hypertension Mother   . Obesity Mother   . Varicose Veins Mother   . Heart disease Father   . Heart attack Father 20  . Early death Father   . Depression Sister        x 5 sisters  . COPD Sister   . Hypertension Sister   . Heart disease Brother        x 3 brothers  . Diabetes Brother   . Colon polyps Sister   . Hearing loss Sister   . Heart attack Sister   . Heart attack Brother   . Arthritis Sister   . Varicose Veins Sister   . Arthritis Brother   . Heart disease Brother   . Hypertension Brother   . Cancer Maternal Grandfather   . Stroke Maternal Grandfather   . COPD Sister   . Obesity Sister   . Depression Sister   . Depression Sister   . Heart disease Sister   . Hypertension Sister   . Diabetes Brother   . Heart disease Brother   . Hypertension Brother   . Obesity Brother   . Varicose Veins Brother   . Heart disease Brother   . Hypertension Brother   . Obesity Brother   . Hypertension Sister   . Obesity Sister   . Miscarriages / Stillbirths Maternal Aunt   . Miscarriages / Stillbirths Paternal Aunt   . Breast cancer Neg Hx     Social History: Reviewed social history from my progress note on 05/15/2018 Social History   Socioeconomic History  . Marital status: Significant Other    Spouse name: Not on file  . Number of children: 1  . Years of education: [employer]  . Highest education level: Some college, no degree  Occupational History  . Not on file  Social Needs  . Financial resource strain: Not very hard  . Food insecurity     Worry: Never true    Inability: Never true  . Transportation needs    Medical: No    Non-medical: No  Tobacco Use  . Smoking status: Former Smoker    Packs/day: 0.25    Years: 10.00    Pack years: 2.50    Types: Cigarettes    Quit date: 11/29/1995    Years  since quitting: 22.9  . Smokeless tobacco: Former Systems developer  . Tobacco comment: Quite 1997, didnt smoke hardly at all when I was smoking.  Substance and Sexual Activity  . Alcohol use: No  . Drug use: No  . Sexual activity: Yes    Birth control/protection: Condom, Post-menopausal, Surgical    Comment: Total Hysterectomy condoms  Lifestyle  . Physical activity    Days per week: 5 days    Minutes per session: 20 min  . Stress: Not on file  Relationships  . Social Herbalist on phone: Not on file    Gets together: Not on file    Attends religious service: Never    Active member of club or organization: No    Attends meetings of clubs or organizations: Never    Relationship status: Widowed  Other Topics Concern  . 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 Flagyl [Metronidazole] Anaphylaxis  . Cymbalta [Duloxetine Hcl]   . Tape Rash    Metabolic Disorder Labs: Lab Results  Component Value Date   HGBA1C 5.2 08/11/2018   MPG 103 08/11/2018   MPG 94 07/30/2017   No results found for: PROLACTIN Lab Results  Component Value Date   CHOL 138 08/11/2018   TRIG 69 08/11/2018   HDL 79 08/11/2018   CHOLHDL 1.7 08/11/2018   LDLCALC 44 08/11/2018   LDLCALC 38 03/10/2018   Lab Results  Component Value Date   TSH 0.86 08/11/2018   TSH 1.88 07/30/2017    Therapeutic Level Labs: No results found for: LITHIUM No results found for: VALPROATE No components found for:  CBMZ  Current Medications: Current Outpatient Medications  Medication Sig Dispense Refill   . ARIPiprazole (ABILIFY) 20 MG tablet Take 1 tablet (20 mg total) by mouth daily. 90 tablet 0  . aspirin 81 MG EC tablet TAKE 1 TABLET BY MOUTH EVERY DAY 90 tablet 3  . Biotin 10000 MCG TABS Take 10,000 mcg by mouth 1 day or 1 dose.    . blood glucose meter kit and supplies Dispense based on patient and insurance preference. Use up to four times daily as directed. (FOR ICD-10 E10.9, E11.9). 1 each 0  . estradiol (ESTRACE) 1 MG tablet TAKE 1 TABLET BY MOUTH EVERY DAY 90 tablet 1  . etodolac (LODINE) 500 MG tablet Take 1 tablet (500 mg total) by mouth 2 (two) times daily. 180 tablet 1  . ezetimibe (ZETIA) 10 MG tablet TAKE ONE TABLET BY MOUTH ONE TIME DAILY  90 tablet 1  . FERROUS SULFATE-FOLIC ACID PO Take 1 tablet by mouth 2 (two) times daily.     . fluticasone (FLONASE) 50 MCG/ACT nasal spray SPRAY 2 SPRAYS INTO EACH NOSTRIL EVERY DAY 48 mL 3  . furosemide (LASIX) 20 MG tablet TAKE 1 TABLET (20 MG TOTAL) BY MOUTH DAILY AS NEEDED FOR FLUID OR EDEMA. 90 tablet 0  . gabapentin (NEURONTIN) 800 MG tablet Take 1 tablet (800 mg total) by mouth 3 (three) times daily. 270 tablet 1  . Melatonin 10 MG CAPS Take 2 capsules by mouth at bedtime.     . Multiple Vitamin (MULTIVITAMIN WITH MINERALS) TABS tablet Take 1 tablet by mouth daily.    Marland Kitchen omeprazole (PRILOSEC) 40 MG capsule TAKE 1 CAPSULE BY MOUTH EVERY DAY 90 capsule 3  . ondansetron (ZOFRAN ODT)  4 MG disintegrating tablet Take 1 tablet (4 mg total) by mouth every 8 (eight) hours as needed for nausea or vomiting. 30 tablet 2  . oxyCODONE (OXY IR/ROXICODONE) 5 MG immediate release tablet TAKE 1 TABLET BY MOUTH EVERY 8 HOURS FILL ON DNF PRIOR TO 04/29/2018    . phentermine (ADIPEX-P) 37.5 MG tablet Take 37.5 mg by mouth daily.    . propranolol (INDERAL) 10 MG tablet TAKE 1 TABLET (10 MG TOTAL) BY MOUTH 3 (THREE) TIMES DAILY AS NEEDED. FOR ANXIETY ATTACKS 270 tablet 1  . rosuvastatin (CRESTOR) 40 MG tablet Take 1 tablet (40 mg total) by mouth daily. 90  tablet 0  . tiZANidine (ZANAFLEX) 4 MG tablet Take 1 tablet (4 mg total) by mouth 3 (three) times daily. (Patient taking differently: Take 4 mg by mouth 3 (three) times daily. ) 90 tablet 1  . traZODone (DESYREL) 150 MG tablet TAKE 1 TABLET (150 MG TOTAL) BY MOUTH AT BEDTIME. 90 tablet 1  . venlafaxine XR (EFFEXOR XR) 75 MG 24 hr capsule Take 1 capsule (75 mg total) by mouth daily with breakfast. 90 capsule 0  . vitamin E 100 UNIT capsule Take 100 mg by mouth daily.     No current facility-administered medications for this visit.      Musculoskeletal: Strength & Muscle Tone: UTA Gait & Station: normal Patient leans: N/A  Psychiatric Specialty Exam: Review of Systems  Psychiatric/Behavioral: Positive for depression (improved).  All other systems reviewed and are negative.   There were no vitals taken for this visit.There is no height or weight on file to calculate BMI.  General Appearance: Casual  Eye Contact:  Fair  Speech:  Clear and Coherent  Volume:  Normal  Mood:  Depressed improving  Affect:  Congruent  Thought Process:  Goal Directed and Descriptions of Associations: Intact  Orientation:  Full (Time, Place, and Person)  Thought Content: Logical   Suicidal Thoughts:  No  Homicidal Thoughts:  No  Memory:  Immediate;   Fair Recent;   Fair Remote;   Fair  Judgement:  Fair  Insight:  Fair  Psychomotor Activity:  Normal  Concentration:  Concentration: Fair and Attention Span: Fair  Recall:  [employer] of Knowledge: Fair  Language: Fair  Akathisia:  No  Handed:  Right  AIMS (if indicated): Denies tremors, rigidity  Assets:  Communication Skills Desire for Improvement Social Support  ADL's:  Intact  Cognition: WNL  Sleep:  Fair   Screenings: GAD-7     Office Visit from 08/18/2018 in [facility] Office Visit from 07/22/2018 in [facility] Office Visit from 03/13/2018 in [facility] Office Visit from 12/12/2017 in [facility] Office Visit from 08/08/2017 in [facility]  Total GAD-7 Score  9  8  5  6  3     PHQ2-9     Office Visit from 09/09/2018 in [facility] Office Visit from 08/18/2018 in [facility] Office Visit from 07/22/2018 in [facility] Office Visit from 03/13/2018 in [facility] Office Visit from 12/12/2017 in [facility]  PHQ-2 Total Score  0  2  0  1  1  PHQ-9 Total Score  3  11  2  4  3        Assessment and Plan: Ashley Fields is a 63 year old Caucasian female, on disability, engaged, lives in [place], has a history of PTSD, mood lability,  panic attacks, chronic pain was evaluated by telemedicine today.  Patient is biologically predisposed given her family history as well as her own medical problems.  Patient with psychosocial stressors of multiple medical problems and chronic pain.  Patient is currently making progress on the current medication regimen.  Plan Bipolar disorder type II-improving Abilify 20 mg p.o. daily Venlafaxine XR 75 mg p.o. daily with breakfast  PTSD-improving Effexor as prescribed Trazodone 150 mg p.o. nightly Melatonin 10 mg p.o. daily  Panic attacks-improving Propranolol 10 mg p.o. 3 times daily as needed for anxiety attacks  Patient declines psychotherapy referral  Follow-up in clinic in 4 weeks or sooner if needed.  January 4 at 10:20 AM    I have spent atleast 15 MINUTES NON face to face with patient today. More than 50 % of the time was spent for psychoeducation and supportive psychotherapy and care coordination. This note was generated in part or whole with voice recognition software. Voice recognition is usually quite accurate but there are transcription errors that can and very often do occur. I apologize for any typographical errors that were not detected and corrected.     Ursula Alert, MD 11/14/2018, 12:37 PM

## 2018-11-14 ENCOUNTER — Other Ambulatory Visit: Payer: Self-pay

## 2018-11-14 ENCOUNTER — Encounter: Payer: Self-pay | Admitting: Psychiatry

## 2018-11-14 ENCOUNTER — Ambulatory Visit (INDEPENDENT_AMBULATORY_CARE_PROVIDER_SITE_OTHER): Payer: Medicare HMO | Admitting: Psychiatry

## 2018-11-14 DIAGNOSIS — F410 Panic disorder [episodic paroxysmal anxiety] without agoraphobia: Secondary | ICD-10-CM

## 2018-11-14 DIAGNOSIS — F4310 Post-traumatic stress disorder, unspecified: Secondary | ICD-10-CM

## 2018-11-14 DIAGNOSIS — R69 Illness, unspecified: Secondary | ICD-10-CM | POA: Diagnosis not present

## 2018-11-14 DIAGNOSIS — F3181 Bipolar II disorder: Secondary | ICD-10-CM

## 2018-11-20 ENCOUNTER — Encounter: Payer: Self-pay | Admitting: Family Medicine

## 2018-11-20 ENCOUNTER — Ambulatory Visit (INDEPENDENT_AMBULATORY_CARE_PROVIDER_SITE_OTHER): Payer: Medicare HMO | Admitting: Family Medicine

## 2018-11-20 ENCOUNTER — Other Ambulatory Visit: Payer: Self-pay

## 2018-11-20 VITALS — BP 115/79 | HR 67 | Temp 98.3°F | Resp 16 | Ht 62.0 in | Wt 194.0 lb

## 2018-11-20 DIAGNOSIS — M810 Age-related osteoporosis without current pathological fracture: Secondary | ICD-10-CM

## 2018-11-20 DIAGNOSIS — E875 Hyperkalemia: Secondary | ICD-10-CM

## 2018-11-20 DIAGNOSIS — I25118 Atherosclerotic heart disease of native coronary artery with other forms of angina pectoris: Secondary | ICD-10-CM | POA: Diagnosis not present

## 2018-11-20 DIAGNOSIS — D508 Other iron deficiency anemias: Secondary | ICD-10-CM | POA: Diagnosis not present

## 2018-11-20 NOTE — Patient Instructions (Addendum)
Thank you for coming to the office today.  Anemia panel today  Chemistry for Potassium  For DEXA Scan (Bone mineral density) screening for osteoporosis  Call the [facility] below anytime to schedule your own appointment now that order has been placed.  [facility] [place], [place] [address] Phone: [phone]   Please schedule a Follow-up Appointment to: Return in about 4 months (around 03/20/2019) for 4 month follow-up anemia.  If you have any other questions or concerns, please feel free to call the office or send a message through [place]. You may also schedule an earlier appointment if necessary.  Additionally, you may be receiving a survey about your experience at our office within a few days to 1 week by e-mail or mail. We value your feedback.  Nobie Putnam, DO [place]

## 2018-11-20 NOTE — Progress Notes (Signed)
Subjective:    Patient ID: Ashley Fields, female    DOB: February 22, 1955, 63 y.o.   MRN: [id number]  Ashley Fields is a 63 y.o. 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 Her fatigue improved but not resolved. B12 treatment as well and Vitamin D Last visit and labs from [facility] 10/2018 showed improved Hgb, and other results, but did not have anemia panel, she request this check today  History of Hyperkalemia Last lab 5.2 (08/2018) She has improved, diet modification, will re-check chemistry today  CAD with stable angina Followed by [facility] Continues on medication management with ASA, Statin, Zetia, has not had any significant worsening. She has had risk stratification scoring as well.  Health Maintenance: History of osteoporosis - due for DEXA, last done >3-4 years ago.  Depression screen [facility] 2/9 11/20/2018 09/09/2018 08/18/2018  Decreased Interest 1 0 1  Down, Depressed, Hopeless 1 0 1  PHQ - 2 Score 2 0 2  Altered sleeping 0 1 0  Tired, decreased energy 1 1 3   Change in appetite 2 0 3  Feeling bad or failure about yourself  0 0 1  Trouble concentrating 0 0 1  Moving slowly or fidgety/restless 1 1 1   Suicidal thoughts 0 0 0  PHQ-9 Score 6 3 11   Difficult doing work/chores Somewhat difficult Not difficult at all Somewhat difficult  Some recent data might be hidden   GAD 7 : Generalized Anxiety Score 11/20/2018 08/18/2018 07/22/2018 03/13/2018  Nervous, Anxious, on Edge 1 1 2 1   Control/stop worrying 1 1 1 1   Worry too much - different things 1 2 1 1   Trouble relaxing 1 1 2 1   Restless 0 1 2 0  Easily annoyed or irritable 0 2 0 1  Afraid - awful might happen 0 1 0 0  Total GAD 7 Score 4 9 8 5   Anxiety Difficulty Somewhat difficult Somewhat difficult Not difficult  at all Somewhat difficult      Social History   Tobacco Use  . Smoking status: Former Smoker    Packs/day: 0.25    Years: 10.00    Pack years: 2.50    Types: Cigarettes    Quit date: 11/29/1995    Years since quitting: 22.9  . Smokeless tobacco: Former Systems developer  . Tobacco comment: Quite 1997, didnt smoke hardly at all when I was smoking.  Substance Use Topics  . Alcohol use: No  . Drug use: No    Review of Systems Per HPI unless specifically indicated above     Objective:    BP 115/79 (BP Location: Left Arm, Patient Position: Sitting, Cuff Size: Normal)   Pulse 67   Temp 98.3 F (36.8 C) (Oral)   Resp 16   Ht 5\' 2"  (1.575 m)   Wt 194 lb (88 kg)   BMI 35.48 kg/m   Wt Readings from Last 3 Encounters:  11/20/18 194 lb (88 kg)  10/24/18 190 lb (86.2 kg)  10/15/18 188 lb (85.3 kg)    Physical Exam Vitals signs and nursing note reviewed.  Constitutional:      General: She is not in acute distress.    Appearance: She is well-developed. She is not diaphoretic.     Comments: Well-appearing, comfortable, cooperative  HENT:  Head: Normocephalic and atraumatic.  Eyes:     General:        Right eye: No discharge.        Left eye: No discharge.     Conjunctiva/sclera: Conjunctivae normal.  Cardiovascular:     Rate and Rhythm: Normal rate.  Pulmonary:     Effort: Pulmonary effort is normal.  Skin:    General: Skin is warm and dry.     Findings: No erythema or rash.  Neurological:     Mental Status: She is alert and oriented to person, place, and time.  Psychiatric:        Behavior: Behavior normal.     Comments: Well groomed, good eye contact, normal speech and thoughts        Results for orders placed or performed in visit on 10/24/18  Vitamin B12  Result Value Ref Range   Vitamin B-12 547 180 - 914 pg/mL  Lactate dehydrogenase  Result Value Ref Range   LDH 161 98 - 192 U/L  CBC with Differential/Platelet  Result Value Ref Range   WBC 4.6 4.0 - 10.5  K/uL   RBC 4.85 3.87 - 5.11 MIL/uL   Hemoglobin 13.8 12.0 - 15.0 g/dL   HCT 42.7 36.0 - 46.0 %   MCV 88.0 80.0 - 100.0 fL   MCH 28.5 26.0 - 34.0 pg   MCHC 32.3 30.0 - 36.0 g/dL   RDW 14.9 11.5 - 15.5 %   Platelets 146 (L) 150 - 400 K/uL   nRBC 0.0 0.0 - 0.2 %   Neutrophils Relative % 56 %   Neutro Abs 2.7 1.7 - 7.7 K/uL   Lymphocytes Relative 32 %   Lymphs Abs 1.5 0.7 - 4.0 K/uL   Monocytes Relative 7 %   Monocytes Absolute 0.3 0.1 - 1.0 K/uL   Eosinophils Relative 4 %   Eosinophils Absolute 0.2 0.0 - 0.5 K/uL   Basophils Relative 1 %   Basophils Absolute 0.0 0.0 - 0.1 K/uL   Immature Granulocytes 0 %   Abs Immature Granulocytes 0.01 0.00 - 0.07 K/uL  Urinalysis, Complete w Microscopic  Result Value Ref Range   Color, Urine YELLOW (A) YELLOW   APPearance HAZY (A) CLEAR   Specific Gravity, Urine 1.014 1.005 - 1.030   pH 5.0 5.0 - 8.0   Glucose, UA NEGATIVE NEGATIVE mg/dL   Hgb urine dipstick NEGATIVE NEGATIVE   Bilirubin Urine MODERATE (A) NEGATIVE   Ketones, ur NEGATIVE NEGATIVE mg/dL   Protein, ur NEGATIVE NEGATIVE mg/dL   Nitrite NEGATIVE NEGATIVE   Leukocytes,Ua SMALL (A) NEGATIVE   RBC / HPF 0-5 0 - 5 RBC/hpf   WBC, UA 6-10 0 - 5 WBC/hpf   Bacteria, UA RARE (A) NONE SEEN   Squamous Epithelial / LPF 0-5 0 - 5      Assessment & Plan:   Problem List Items Addressed This Visit    Atherosclerosis of native coronary artery of native heart with stable angina pectoris (HCC)   Relevant Medications   gabapentin (NEURONTIN) 300 MG capsule   Acquired iron deficiency anemia due to decreased absorption - Primary   Relevant Orders   [facility] - Iron Panel Fe+TIBC+Fer    Other Visit Diagnoses    Hyperkalemia       Relevant Orders   [place] - BMET w/ GFR BMP Basic Metabolic Panel   Age-related osteoporosis without current pathological fracture       Relevant Orders   DG Bone Density      #  CAD Asymptomatic On med management per Cardiology  #Anemia / malabsorption B12  Followed by Hematology Continue current IV iron therapy, B12, Vit D Check anemia panel today and chemistry Follow up results  #Osteoporosis history Order DEXA - overdue, patient can schedule  No orders of the defined types were placed in this encounter.    Follow up plan: Return in about 4 months (around 03/20/2019) for 4 month follow-up anemia.   Nobie Putnam, DO [facility] 11/20/2018, 8:14 AM

## 2018-11-21 LAB — BASIC METABOLIC PANEL WITH GFR
BUN: 18 mg/dL (ref 7–25)
CO2: 28 mmol/L (ref 20–32)
Calcium: 9.1 mg/dL (ref 8.6–10.4)
Chloride: 105 mmol/L (ref 98–110)
Creat: 0.99 mg/dL (ref 0.50–0.99)
GFR, Est African American: 71 mL/min/{1.73_m2} (ref >=60)
GFR, Est Non African American: 61 mL/min/{1.73_m2} (ref >=60)
Glucose, Bld: 86 mg/dL (ref 65–99)
Potassium: 4.3 mmol/L (ref 3.5–5.3)
Sodium: 142 mmol/L (ref 135–146)

## 2018-11-21 LAB — IRON,TIBC AND FERRITIN PANEL
%SAT: 20 % (calc) (ref 16–45)
Ferritin: 37 ng/mL (ref 16–288)
Iron: 79 ug/dL (ref 45–160)
TIBC: 389 mcg/dL (calc) (ref 250–450)

## 2018-11-26 DIAGNOSIS — M545 Low back pain: Secondary | ICD-10-CM | POA: Diagnosis not present

## 2018-11-26 DIAGNOSIS — Z79899 Other long term (current) drug therapy: Secondary | ICD-10-CM | POA: Diagnosis not present

## 2018-11-26 DIAGNOSIS — M48061 Spinal stenosis, lumbar region without neurogenic claudication: Secondary | ICD-10-CM | POA: Diagnosis not present

## 2018-11-26 DIAGNOSIS — M533 Sacrococcygeal disorders, not elsewhere classified: Secondary | ICD-10-CM | POA: Diagnosis not present

## 2018-11-26 DIAGNOSIS — M25552 Pain in left hip: Secondary | ICD-10-CM | POA: Diagnosis not present

## 2018-11-26 DIAGNOSIS — M479 Spondylosis, unspecified: Secondary | ICD-10-CM | POA: Diagnosis not present

## 2018-11-26 DIAGNOSIS — G894 Chronic pain syndrome: Secondary | ICD-10-CM | POA: Diagnosis not present

## 2018-12-10 DIAGNOSIS — M47816 Spondylosis without myelopathy or radiculopathy, lumbar region: Secondary | ICD-10-CM | POA: Diagnosis not present

## 2018-12-15 ENCOUNTER — Telehealth: Payer: Self-pay

## 2018-12-15 NOTE — Telephone Encounter (Signed)
Returned call to patient.  She reports she is having headaches, does have a history of migraine headaches.  Discussed with patient that her medications possibly could make it worse since they work on serotonin receptors.  However medications like venlafaxine and propranolol are used for the treatment of headaches as well.  Advised patient to talk to her headache provider for medication management.  Discussed with her that she could go up on the propranolol to 20 mg 3 times a day as needed to see if that will help with her headaches or not.

## 2018-12-15 NOTE — Telephone Encounter (Signed)
pt states she having headaches and she not sure if it is coming from change in medication.

## 2018-12-16 ENCOUNTER — Encounter: Payer: Self-pay | Admitting: Family Medicine

## 2018-12-16 ENCOUNTER — Other Ambulatory Visit: Payer: Self-pay

## 2018-12-16 ENCOUNTER — Ambulatory Visit (INDEPENDENT_AMBULATORY_CARE_PROVIDER_SITE_OTHER): Payer: Medicare HMO | Admitting: Family Medicine

## 2018-12-16 VITALS — BP 137/73 | HR 73 | Temp 97.5°F | Ht 62.0 in | Wt 197.0 lb

## 2018-12-16 DIAGNOSIS — G43701 Chronic migraine without aura, not intractable, with status migrainosus: Secondary | ICD-10-CM | POA: Diagnosis not present

## 2018-12-16 DIAGNOSIS — R110 Nausea: Secondary | ICD-10-CM

## 2018-12-16 MED ORDER — SUMATRIPTAN SUCCINATE 100 MG PO TABS: 50.0000 mg | ORAL_TABLET | Freq: Once | ORAL | 2 refills | Status: DC | PRN

## 2018-12-16 MED ORDER — ONDANSETRON 4 MG PO TBDP: 4.0000 mg | ORAL_TABLET | Freq: Three times a day (TID) | ORAL | 2 refills | Status: DC | PRN

## 2018-12-16 NOTE — Patient Instructions (Addendum)
Thank you for coming to the office today.  1. You most likely have Chronic Migraine Headaches - Migraine headaches present differently for many patients, pain is usually throbbing or aching, often on one side of head or behind the eye. They tend to last for up to hours or days. In treating migraines, our goal is to 1) stop the headache and 2) prevent recurrence of headaches  Treatment to STOP the headache at this time: - Start with Sumatriptan 100mg  - take HALF tab first - immediately at onset of moderate to severe migraine headache, if unresolved or return within 2 hours then repeat dose 1 tablet, that is max dose for 24 hours. (Note - this medication can cause a brief episode of flushing and chest pressure or pain very soon after taking it. That is NORMAL, and it is the medicine taking effect and dilating some blood vessels. It should pass, and resolve within seconds to minutes after - it may not happen at all)  - Try this for 1-2 weeks, if absolutely NOT helping then contact me to discuss changes, possibly can double sumatriptan dose or try nasal spray - You can still take over the counter meds with Ibuprofen up to 600-800mg  per dose 3 times a day with food for a few days and may try Excedrin Migraine as needed, ONLY use these after you have tried Sumatriptan up to 2 doses, and try to limit their use if they are not effective  Treatment to PREVENT headaches: - Goal is to avoid triggers. We need to learn more details on what are your exact or possible headache triggers first. - Keep detailed headache diary (on printed handout) for possible triggers, bring this to your next visit to discuss further - Known possible triggers include caffeine, chocolate, alcohol, stress, weather changes, menstrual cycle, certain other foods - Also be aware that OTC pain meds/anti-inflammatories can cause rebound headache, they help resolve the headache but then after the effect wears off they can CAUSE a headache. Try  to taper down and stop these medications and allow them to get out of your system for 1-2 weeks   Future medication options that can be considered if persistent severe migraines and if failed some of the above prevention medicines include: - Aimovig, Emgality, Trulicity   (these are once a month self injection medications that work directly to block migraine signals. They are relatively new, can be costly and have been proven to be very safe and effective. We can try to get them approved or for low cost if needed in future.)   [facility] - Neurology Dept [place], [place] [address] Phone: [phone]  Future can consider new migraine medicines  Abortive therapy - Reyvow  Prophylaxis therapy - Emgality, Aimovig, Ajovy  Please schedule a Follow-up Appointment to: Return in about 3 months (around 03/16/2019) for migraine headaches.  If you have any other questions or concerns, please feel free to call the office or send a message through [facility]. You may also schedule an earlier appointment if necessary.  Additionally, you may be receiving a survey about your experience at our office within a few days to 1 week by e-mail or mail. We value your feedback.  Nobie Putnam, DO [place]

## 2018-12-16 NOTE — Progress Notes (Signed)
Subjective:    Patient ID: Ashley Fields, female    DOB: [birth year], 63 y.o.   MRN: [id number]  Ashley Fields is a 63 y.o. female presenting on 12/16/2018 for Headache (persistent headaches w/ history of migraines x 4 days. No relief after taking propanolol, Oxycodone, and fiorcet )   HPI   Acute on Chronic MIgraine with status migrainosus New onset migraine x 4 days. Has been mostly migraine headache free for years. Usually has not had them for a while since neck / shoulder surgery back in [place]. - Current meds not working as well. Tried Oxycodone PRN from pain management, also Fioricet PRN. And she takes propranolol, Gabapentin, tizanidine, effexor already. - Associated elevated BP with migraine headache. Was improved in AM now up to 130+ with headache  In past when she had migraines, back in [place], they were considering Botox injections but did not pursue this because migraines improved after her shoulder surgery. Has existing Zofran ODT but it is >69 year old, she needs new refill since has nausea associated with migraine  In past she has tried imitrex sumatriptan uncertain results on this years ago, willing to try it again. Not on any CGRP inhibitor or other newer prophylaxis agent.  Prefers dark room helps her avoid headache and rest helps.  Denies any dyspnea, chest pain, numbness, weakness tingling, vision changes.  Depression screen Ashley Fields 2/9 12/16/2018 11/20/2018 09/09/2018  Decreased Interest 0 1 0  Down, Depressed, Hopeless 0 1 0  PHQ - 2 Score 0 2 0  Altered sleeping - 0 1  Tired, decreased energy - 1 1  Change in appetite - 2 0  Feeling bad or failure about yourself  - 0 0  Trouble concentrating - 0 0  Moving slowly or fidgety/restless - 1 1  Suicidal thoughts - 0 0  PHQ-9 Score - 6 3  Difficult doing work/chores - Somewhat difficult Not difficult at all  Some recent data might be hidden    Social History   Tobacco Use  . Smoking status: Former Smoker   Packs/day: 0.25    Years: 10.00    Pack years: 2.50    Types: Cigarettes    Quit date: 11/29/1995    Years since quitting: 23.0  . Smokeless tobacco: Former Systems developer  . Tobacco comment: Quite 1997, didnt smoke hardly at all when I was smoking.  Substance Use Topics  . Alcohol use: No  . Drug use: No    Review of Systems Per HPI unless specifically indicated above     Objective:    BP 137/73 (BP Location: Right Arm, Patient Position: Sitting, Cuff Size: Normal)   Pulse 73   Temp (!) 97.5 F (36.4 C) (Oral)   Ht 5\' 2"  (1.575 m)   Wt 197 lb (89.4 kg)   BMI 36.03 kg/m   Wt Readings from Last 3 Encounters:  12/16/18 197 lb (89.4 kg)  11/20/18 194 lb (88 kg)  10/24/18 190 lb (86.2 kg)    Physical Exam Vitals signs and nursing note reviewed.  Constitutional:      General: She is not in acute distress.    Appearance: She is well-developed. She is not diaphoretic.     Comments: Well-appearing, slightly uncomfortable with headache, cooperative  HENT:     Head: Normocephalic and atraumatic.  Eyes:     General:        Right eye: No discharge.        Left eye: No discharge.  Conjunctiva/sclera: Conjunctivae normal.  Neck:     Musculoskeletal: Normal range of motion and neck supple. No neck rigidity.  Cardiovascular:     Rate and Rhythm: Normal rate.  Pulmonary:     Effort: Pulmonary effort is normal.  Skin:    General: Skin is warm and dry.     Findings: No erythema or rash.  Neurological:     Mental Status: She is alert and oriented to person, place, and time.     Cranial Nerves: No cranial nerve deficit.     Sensory: No sensory deficit.     Motor: No weakness.  Psychiatric:        Behavior: Behavior normal.     Comments: Well groomed, good eye contact, normal speech and thoughts    Results for orders placed or performed in visit on 11/20/18  [facility] - Iron Panel Fe+TIBC+Fer  Result Value Ref Range   Iron 79 45 - 160 mcg/dL   TIBC 389 250 - 450 mcg/dL (calc)    %SAT 20 16 - 45 % (calc)   Ferritin 37 16 - 288 ng/mL  [facility] - BMET w/ GFR BMP Basic Metabolic Panel  Result Value Ref Range   Glucose, Bld 86 65 - 99 mg/dL   BUN 18 7 - 25 mg/dL   Creat 0.99 0.50 - 0.99 mg/dL   GFR, Est Non African American 61 > OR = 60 mL/min/1.41m2   GFR, Est African American 71 > OR = 60 mL/min/1.85m2   BUN/Creatinine Ratio NOT APPLICABLE 6 - 22 (calc)   Sodium 142 135 - 146 mmol/L   Potassium 4.3 3.5 - 5.3 mmol/L   Chloride 105 98 - 110 mmol/L   CO2 28 20 - 32 mmol/L   Calcium 9.1 8.6 - 10.4 mg/dL      Assessment & Plan:   Problem List Items Addressed This Visit    Chronic migraine without aura with status migrainosus, not intractable - Primary   Relevant Medications   butalbital-acetaminophen-caffeine (FIORICET) 50-325-40 MG tablet   SUMAtriptan (IMITREX) 100 MG tablet   Other Relevant Orders   Ambulatory referral to Neurology    Other Visit Diagnoses    Nausea       Relevant Medications   ondansetron (ZOFRAN ODT) 4 MG disintegrating tablet      Consistent with persistent migraine HA x 4 days w/ intermittent worsening, unilateral localized to L side usually Uncertain exact trigger, known history chronic recurrent migraines, worse with elevated BP or causing the elevated BP. - Currently with active HA, well-appearing, no focal neuro deficits, tolerating PO w/o n/v - failed Oxycodone, Fioricet, NSAID, preventative effexor, propranolol  Plan: Start abortive therapy with Sumatriptan 100mg  tabs - take HALF to 1 whole PRN (#9, 0 refill due to quantity limit), severe HA, may repeat dose within 2 hr if persistent, no more in 24 hours, in future can titrate dose to 50-100mg  if needed. Counseling on potential side effect / intolerance with chest discomfort acutely after taking sumatriptan  Use current pain meds / analgesia - can consider high dose Tylenol / Excedrin PRN if need  Avoid triggers including foods, caffeine. Important to rest. - Discussion on future  migraine abortive therapy and prophylaxis - various new meds available such as Reyvow for abortive (caution sedation and driving restriction 8 hr, and already on SNRI Trazodone risk of 5HT serotonin syndrome), also preventative med such as CGNR-inhibitor Emgality, Aimovig, Ajovy  Proceed with referral to [facility] for consultation on migraines given her chronic  history and multiple med failures, she requests to consult with specialist. They can consider newer med options or other therapy as well.  Return criteria given for acute migraine, when to go to office vs ED  Orders Placed This Encounter  Procedures  . Ambulatory referral to Neurology    Referral Priority:   Routine    Referral Type:   Consultation    Referral Reason:   Specialty Services Required    Requested Specialty:   Neurology    Number of Visits Requested:   1     Meds ordered this encounter  Medications  . SUMAtriptan (IMITREX) 100 MG tablet    Sig: Take 0.5 tablets (50 mg total) by mouth once as needed for up to 1 dose for migraine. May repeat one dose in 2 hours if headache persists, for max dose 24 hours    Dispense:  9 tablet    Refill:  2  . ondansetron (ZOFRAN ODT) 4 MG disintegrating tablet    Sig: Take 1 tablet (4 mg total) by mouth every 8 (eight) hours as needed for nausea or vomiting.    Dispense:  30 tablet    Refill:  2     Follow up plan: Return in about 3 months (around 03/16/2019) for migraine headaches.   Nobie Putnam, DO [facility] 12/16/2018, 10:35 AM

## 2018-12-17 DIAGNOSIS — G894 Chronic pain syndrome: Secondary | ICD-10-CM | POA: Diagnosis not present

## 2018-12-17 DIAGNOSIS — Z79899 Other long term (current) drug therapy: Secondary | ICD-10-CM | POA: Diagnosis not present

## 2018-12-19 ENCOUNTER — Other Ambulatory Visit: Payer: Self-pay | Admitting: Family Medicine

## 2018-12-19 DIAGNOSIS — R609 Edema, unspecified: Secondary | ICD-10-CM

## 2018-12-22 DIAGNOSIS — M961 Postlaminectomy syndrome, not elsewhere classified: Secondary | ICD-10-CM | POA: Diagnosis not present

## 2019-01-12 ENCOUNTER — Other Ambulatory Visit: Payer: Self-pay

## 2019-01-12 ENCOUNTER — Encounter: Payer: Self-pay | Admitting: Psychiatry

## 2019-01-12 ENCOUNTER — Ambulatory Visit (INDEPENDENT_AMBULATORY_CARE_PROVIDER_SITE_OTHER): Payer: 59 | Admitting: Psychiatry

## 2019-01-12 DIAGNOSIS — R69 Illness, unspecified: Secondary | ICD-10-CM | POA: Diagnosis not present

## 2019-01-12 DIAGNOSIS — F410 Panic disorder [episodic paroxysmal anxiety] without agoraphobia: Secondary | ICD-10-CM | POA: Diagnosis not present

## 2019-01-12 DIAGNOSIS — F3176 Bipolar disorder, in full remission, most recent episode depressed: Secondary | ICD-10-CM

## 2019-01-12 DIAGNOSIS — F4310 Post-traumatic stress disorder, unspecified: Secondary | ICD-10-CM | POA: Diagnosis not present

## 2019-01-12 MED ORDER — ARIPIPRAZOLE 20 MG PO TABS: 20.0000 mg | ORAL_TABLET | Freq: Every day | ORAL | 0 refills | Status: DC

## 2019-01-12 MED ORDER — PROPRANOLOL HCL 10 MG PO TABS: 10.0000 mg | ORAL_TABLET | Freq: Three times a day (TID) | ORAL | 1 refills | Status: DC | PRN

## 2019-01-12 MED ORDER — VENLAFAXINE HCL ER 75 MG PO CP24: 75.0000 mg | ORAL_CAPSULE | Freq: Every day | ORAL | 0 refills | Status: DC

## 2019-01-12 NOTE — Progress Notes (Addendum)
Virtual Visit via Video Note  I connected with Ashley Fields on 01/12/19 at 10:20 AM EST by a video enabled telemedicine application and verified that I am speaking with the correct person using two identifiers.   I discussed the limitations of evaluation and management by telemedicine and the availability of in person appointments. The patient expressed understanding and agreed to proceed.     I discussed the assessment and treatment plan with the patient. The patient was provided an opportunity to ask questions and all were answered. The patient agreed with the plan and demonstrated an understanding of the instructions.   The patient was advised to call back or seek an in-person evaluation if the symptoms worsen or if the condition fails to improve as anticipated.  [place] MD OP Progress Note  01/12/2019 4:20 PM Ashley Fields  MRN:  [id number]  Chief Complaint:  Chief Complaint    Follow-up     HPI: Ashley Fields is a 64 year old Caucasian female on disability, lives in [place], married, has a history of bipolar disorder, PTSD, chronic pain, migraine headaches hypoglycemic episodes was evaluated by telemedicine today.  Patient reports she is currently doing well with regards to her mood symptoms.  She denies any significant depression or anxiety symptoms.  She reports she is currently compliant on her medications like Abilify and venlafaxine and is doing well.  She denies any side effects.  Patient reports she recently got married.  She also just returned from a trip from [place] where she visited family.  Patient denies any suicidality, homicidality or perceptual disturbances.  She reports she was recently started on Imitrex for headaches.  Her headaches have resolved and she did not even have to take the medication yet.  She however has upcoming appointment with neurology.  Patient denies any other concerns today.   Visit Diagnosis:    ICD-10-CM   1. Bipolar disorder, in full remission, most  recent episode depressed (HCC)  F31.76 ARIPiprazole (ABILIFY) 20 MG tablet    venlafaxine XR (EFFEXOR XR) 75 MG 24 hr capsule    propranolol (INDERAL) 10 MG tablet  2. PTSD (post-traumatic stress disorder)  F43.10 venlafaxine XR (EFFEXOR XR) 75 MG 24 hr capsule    propranolol (INDERAL) 10 MG tablet  3. Panic attacks  F41.0 venlafaxine XR (EFFEXOR XR) 75 MG 24 hr capsule    propranolol (INDERAL) 10 MG tablet    Past Psychiatric History: I have reviewed past psychiatric history from my progress note on 05/15/2018.  Past trials of Prozac, Wellbutrin, Lexapro, Cymbalta, Rexulti  Past Medical History:  Past Medical History:  Diagnosis Date  . Allergy   . Anemia   . Anxiety   . Arthritis    Yes  . Blood transfusion without reported diagnosis 1977   Transfusions from miscarriage & hemorrhaging  . Chronic kidney disease   . Depression   . Disp fx of cuboid bone of right foot, init for clos fx 01/01/2017  . GERD (gastroesophageal reflux disease)   . Headache   . Hepatitis C   . Hyperlipidemia   . Neuromuscular disorder ([place]) 1997   Falling to much was an eye-opener  . Osteoporosis   . Thyroid disease   . Urine incontinence     Past Surgical History:  Procedure Laterality Date  . 5 miscarriages     1977-1985, Blood transfusion s/p miscarriage 1977  . ABDOMINAL HYSTERECTOMY  1987   Total  . APPENDECTOMY  12/2008  . AUGMENTATION MAMMAPLASTY Bilateral 2015   Bilat  .  AUGMENTATION MAMMAPLASTY Bilateral 2018   implants redone w/ placement of implants under muscle  . breast lift bilateral, implants  0000000   bilateral, silicon naturel  . BREAST REDUCTION SURGERY Bilateral 1997  . CESAREAN SECTION  07/27/1981   Placenta Previa  . CHOLECYSTECTOMY  03/2004   Lap surgery  . COSMETIC SURGERY     Breast implants w/ lift, tummy tuck, upper & lower Blepharop  . EYE SURGERY  2017   Cataract surgery & lasik  . GASTRIC BYPASS  2005   Laparoscopic  . IVC FILTER INSERTION  12/2003    TrapEase Vena Cava Filter  . LUMBAR LAMINECTOMY  06/2006   L4-L5 (spinal fusion)  . mini tummy tuck  05/07/2016   Bilateral bra/back roll lift skin removal  . neck surgery C3-C7  02/10/2015   ACDF  . REDUCTION MAMMAPLASTY Bilateral 1997  . SHOULDER ACROMIOPLASTY Left 03/27/2013   w/ labral debridement  . SMALL INTESTINE SURGERY  12/24/2003   Lap Gastric Bypass  . SPINAL CORD STIMULATOR IMPLANT  11/2010   removed 05/2011  . SPINE SURGERY  2008 2017   Fusion L4-L5, ACDF C4-C7    Family Psychiatric History: I have reviewed family psychiatric history from my progress note on 05/15/2018.  Family History:  Family History  Problem Relation Age of Onset  . COPD Mother   . Lung cancer Mother   . Diabetes Mother   . Heart disease Mother   . Stroke Mother   . Heart attack Mother   . Arthritis Mother   . Cancer Mother   . Hypertension Mother   . Obesity Mother   . Varicose Veins Mother   . Heart disease Father   . Heart attack Father 71  . Early death Father   . Depression Sister        x 5 sisters  . COPD Sister   . Hypertension Sister   . Heart disease Brother        x 3 brothers  . Diabetes Brother   . Colon polyps Sister   . Hearing loss Sister   . Heart attack Sister   . Heart attack Brother   . Arthritis Sister   . Varicose Veins Sister   . Arthritis Brother   . Heart disease Brother   . Hypertension Brother   . Cancer Maternal Grandfather   . Stroke Maternal Grandfather   . COPD Sister   . Obesity Sister   . Depression Sister   . Depression Sister   . Heart disease Sister   . Hypertension Sister   . Diabetes Brother   . Heart disease Brother   . Hypertension Brother   . Obesity Brother   . Varicose Veins Brother   . Heart disease Brother   . Hypertension Brother   . Obesity Brother   . Hypertension Sister   . Obesity Sister   . Miscarriages / Stillbirths Maternal Aunt   . Miscarriages / Stillbirths Paternal Aunt   . Breast cancer Neg Hx     Social  History: I have reviewed social history from my progress note on 05/15/2018. Social History   Socioeconomic History  . Marital status: Significant Other    Spouse name: Not on file  . Number of children: 1  . Years of education: [employer]  . Highest education level: Some college, no degree  Occupational History  . Not on file  Tobacco Use  . Smoking status: Former Smoker    Packs/day: 0.25  Years: 10.00    Pack years: 2.50    Types: Cigarettes    Quit date: 11/29/1995    Years since quitting: 23.1  . Smokeless tobacco: Former Systems developer  . Tobacco comment: Quite 1997, didnt smoke hardly at all when I was smoking.  Substance and Sexual Activity  . Alcohol use: No  . Drug use: No  . Sexual activity: Yes    Birth control/protection: Condom, Post-menopausal, Surgical    Comment: Total Hysterectomy condoms  Other Topics Concern  . 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 Difficulty of Paying Living Expenses: Not very hard  Food Insecurity: No Food Insecurity  . Worried About Charity fundraiser in the Last Year: Never true  . Ran Out of Food in the Last Year: Never true  Transportation Needs: No Transportation Needs  . Lack of Transportation (Medical): No  . Lack of Transportation (Non-Medical): No  Physical Activity: Insufficiently Active  . Days of Exercise per Week: 5 days  . Minutes of Exercise per Session: 20 min  Stress:   . Feeling of Stress : Not on file  Social Connections: Unknown  . Frequency of Communication with Friends and Family: Not on file  . Frequency of Social Gatherings with Friends and Family: Not on file  . Attends Religious Services: Never  . Active Member of Clubs or Organizations: No  . Attends Archivist Meetings: Never  . Marital Status: Widowed    Allergies:   Allergies  Allergen Reactions  . Flagyl [Metronidazole] Anaphylaxis  . Cymbalta [Duloxetine Hcl]   . Tape Rash    Metabolic Disorder Labs: Lab Results  Component Value Date   HGBA1C 5.2 08/11/2018   MPG 103 08/11/2018   MPG 94 07/30/2017   No results found for: PROLACTIN Lab Results  Component Value Date   CHOL 138 08/11/2018   TRIG 69 08/11/2018   HDL 79 08/11/2018   CHOLHDL 1.7 08/11/2018   LDLCALC 44 08/11/2018   LDLCALC 38 03/10/2018   Lab Results  Component Value Date   TSH 0.86 08/11/2018   TSH 1.88 07/30/2017    Therapeutic Level Labs: No results found for: LITHIUM No results found for: VALPROATE No components found for:  CBMZ  Current Medications: Current Outpatient Medications  Medication Sig Dispense Refill  . Magnesium 500 MG CAPS magnesium oxide 500 mg capsule  TAKE 1 CAPSULE (500 MG TOTAL) BY MOUTH 2 (TWO) TIMES DAILY AT 8 AM AND 10 PM.    . ARIPiprazole (ABILIFY) 20 MG tablet Take 1 tablet (20 mg total) by mouth daily. 90 tablet 0  . aspirin 81 MG EC tablet TAKE 1 TABLET BY MOUTH EVERY DAY 90 tablet 3  . Biotin 10000 MCG TABS Take 10,000 mcg by mouth 1 day or 1 dose.    . butalbital-acetaminophen-caffeine (FIORICET) 50-325-40 MG tablet Take 1 tablet by mouth 2 (two) times daily as needed for headache.    . estradiol (ESTRACE) 1 MG tablet TAKE 1 TABLET BY MOUTH EVERY DAY 90 tablet 1  . etodolac (LODINE) 500 MG tablet Take 1 tablet (500 mg total) by mouth 2 (two) times daily. 180 tablet 1  . ezetimibe (ZETIA) 10 MG tablet TAKE ONE TABLET BY MOUTH ONE TIME DAILY  90 tablet 1  . ferrous sulfate 325 (65 FE) MG  tablet ferrous sulfate 325 mg (65 mg iron) tablet  TAKE 1 TABLET BY MOUTH DAILY    . FERROUS SULFATE-FOLIC ACID PO Take 1 tablet by mouth 2 (two) times daily.     . fluticasone (FLONASE) 50 MCG/ACT nasal spray SPRAY 2 SPRAYS INTO EACH NOSTRIL EVERY DAY 48 mL 3  . furosemide (LASIX) 20 MG tablet TAKE 1 TABLET (20 MG TOTAL) BY MOUTH DAILY AS NEEDED  FOR FLUID OR EDEMA. 90 tablet 0  . gabapentin (NEURONTIN) 300 MG capsule Take 1 capsule (300 mg total) by mouth 3 (three) times daily. 270 capsule 3  . Melatonin 10 MG CAPS Take 2 capsules by mouth at bedtime.     . Multiple Vitamin (MULTIVITAMIN WITH MINERALS) TABS tablet Take 1 tablet by mouth daily.    Marland Kitchen omeprazole (PRILOSEC) 40 MG capsule TAKE 1 CAPSULE BY MOUTH EVERY DAY 90 capsule 3  . ondansetron (ZOFRAN ODT) 4 MG disintegrating tablet Take 1 tablet (4 mg total) by mouth every 8 (eight) hours as needed for nausea or vomiting. 30 tablet 2  . oxyCODONE (OXY IR/ROXICODONE) 5 MG immediate release tablet TAKE 1 TABLET BY MOUTH EVERY 8 HOURS FILL ON DNF PRIOR TO 04/29/2018    . phentermine (ADIPEX-P) 37.5 MG tablet Take 37.5 mg by mouth daily.    . propranolol (INDERAL) 10 MG tablet Take 1 tablet (10 mg total) by mouth 3 (three) times daily as needed. Severe anxiety attacks 270 tablet 1  . rosuvastatin (CRESTOR) 40 MG tablet Take 1 tablet (40 mg total) by mouth daily. 90 tablet 0  . SUMAtriptan (IMITREX) 100 MG tablet Take 0.5 tablets (50 mg total) by mouth once as needed for up to 1 dose for migraine. May repeat one dose in 2 hours if headache persists, for max dose 24 hours 9 tablet 2  . tiZANidine (ZANAFLEX) 4 MG tablet Take 1 tablet (4 mg total) by mouth 3 (three) times daily. (Patient taking differently: Take 4 mg by mouth 4 (four) times daily. ) 90 tablet 1  . traZODone (DESYREL) 150 MG tablet TAKE 1 TABLET (150 MG TOTAL) BY MOUTH AT BEDTIME. 90 tablet 1  . venlafaxine XR (EFFEXOR XR) 75 MG 24 hr capsule Take 1 capsule (75 mg total) by mouth daily with breakfast. 90 capsule 0  . vitamin E 100 UNIT capsule Take 100 mg by mouth daily.     No current facility-administered medications for this visit.     Musculoskeletal: Strength & Muscle Tone: UTA Gait & Station: normal Patient leans: N/A  Psychiatric Specialty Exam: Review of Systems  Psychiatric/Behavioral: Negative for agitation,  behavioral problems, confusion, decreased concentration, dysphoric mood, hallucinations, self-injury, sleep disturbance and suicidal ideas. The patient is not nervous/anxious and is not hyperactive.   All other systems reviewed and are negative.   There were no vitals taken for this visit.There is no height or weight on file to calculate BMI.  General Appearance: Casual  Eye Contact:  Fair  Speech:  Clear and Coherent  Volume:  Normal  Mood:  Euthymic  Affect:  Congruent  Thought Process:  Goal Directed and Descriptions of Associations: Intact  Orientation:  Full (Time, Place, and Person)  Thought Content: Logical   Suicidal Thoughts:  No  Homicidal Thoughts:  No  Memory:  Immediate;   Fair Recent;   Fair Remote;   Fair  Judgement:  Fair  Insight:  Fair  Psychomotor Activity:  Normal  Concentration:  Concentration: Fair and Attention Span: Fair  Recall:  [place] of Knowledge: Fair  Language: Fair  Akathisia:  No  Handed:  Right  AIMS (if indicated):Denies tremors, rigidity  Assets:  Communication Skills Desire for Improvement Housing Social Support  ADL's:  Intact  Cognition: WNL  Sleep:  Fair   Screenings: GAD-7     Office Visit from 11/20/2018 in [facility] Office Visit from 08/18/2018 in [facility] Office Visit from 07/22/2018 in [facility] Office Visit from 03/13/2018 in [facility] Office Visit from 12/12/2017 in [facility]  Total GAD-7 Score  4  9  8  5  6     PHQ2-9     Office Visit from 12/16/2018 in [facility] Office Visit from 11/20/2018 in [facility] Office Visit from 09/09/2018 in [facility] Office Visit from 08/18/2018 in [facility] Office Visit from 07/22/2018 in [facility]  PHQ-2 Total Score  0  2  0  2  0  PHQ-9 Total Score  --  6  3  11  2        Assessment and Plan: Ashley Fields is a 64 year old  Caucasian female on disability, engaged, lives in [place], has a history of PTSD, bipolar disorder, panic attacks, migraine headaches, chronic pain was evaluated by telemedicine today.  Patient is biologically predisposed given her family history as well as her own medical problems.  Patient with psychosocial stressors of multiple health problems at the current pandemic.  She is currently making progress.  Plan as noted below.  Plan Bipolar disorder type II-in remission Abilify 20 mg p.o. daily Venlafaxine XR 75 mg p.o. daily with breakfast  PTSD-improving Venlafaxine as prescribed Trazodone 150 mg p.o. nightly Melatonin 10 mg p.o. daily   Panic attacks-improving Propranolol 20 mg p.o. 3 times daily as needed for anxiety attacks  Patient declines psychotherapy referral.  Discussed with patient the interaction between her psychotropic medications as well as Imitrex including serotonin syndrome.  Follow-up in clinic in 8 weeks or sooner if needed.  March 8 at 10 AM  I have spent atleast 20 minutes non face to face with patient today. More than 50 % of the time was spent for psychoeducation and supportive psychotherapy and care coordination. This note was generated in part or whole with voice recognition software. Voice recognition is usually quite accurate but there are transcription errors that can and very often do occur. I apologize for any typographical errors that were not detected and corrected.      Ursula Alert, MD 01/12/2019, 4:20 PM

## 2019-01-16 DIAGNOSIS — M479 Spondylosis, unspecified: Secondary | ICD-10-CM | POA: Diagnosis not present

## 2019-01-16 DIAGNOSIS — M533 Sacrococcygeal disorders, not elsewhere classified: Secondary | ICD-10-CM | POA: Diagnosis not present

## 2019-01-16 DIAGNOSIS — G894 Chronic pain syndrome: Secondary | ICD-10-CM | POA: Diagnosis not present

## 2019-01-16 DIAGNOSIS — M48061 Spinal stenosis, lumbar region without neurogenic claudication: Secondary | ICD-10-CM | POA: Diagnosis not present

## 2019-01-16 DIAGNOSIS — Z79899 Other long term (current) drug therapy: Secondary | ICD-10-CM | POA: Diagnosis not present

## 2019-01-16 DIAGNOSIS — M25552 Pain in left hip: Secondary | ICD-10-CM | POA: Diagnosis not present

## 2019-01-16 DIAGNOSIS — M545 Low back pain: Secondary | ICD-10-CM | POA: Diagnosis not present

## 2019-01-22 ENCOUNTER — Telehealth: Payer: Self-pay

## 2019-01-22 NOTE — Telephone Encounter (Signed)
Please fill out a letter for her. Thanks

## 2019-01-22 NOTE — Telephone Encounter (Signed)
pt called left message that she needed a letter for jury duty.

## 2019-01-23 ENCOUNTER — Inpatient Hospital Stay: Payer: 59

## 2019-01-23 ENCOUNTER — Other Ambulatory Visit: Payer: Self-pay | Admitting: Internal Medicine

## 2019-01-23 ENCOUNTER — Other Ambulatory Visit: Payer: Self-pay

## 2019-01-23 ENCOUNTER — Inpatient Hospital Stay: Payer: 59 | Admitting: Internal Medicine

## 2019-01-23 ENCOUNTER — Inpatient Hospital Stay: Payer: 59 | Attending: Internal Medicine

## 2019-01-23 ENCOUNTER — Other Ambulatory Visit: Payer: Self-pay | Admitting: *Deleted

## 2019-01-23 DIAGNOSIS — D508 Other iron deficiency anemias: Secondary | ICD-10-CM

## 2019-01-23 DIAGNOSIS — E559 Vitamin D deficiency, unspecified: Secondary | ICD-10-CM | POA: Insufficient documentation

## 2019-01-23 DIAGNOSIS — D509 Iron deficiency anemia, unspecified: Secondary | ICD-10-CM | POA: Insufficient documentation

## 2019-01-23 DIAGNOSIS — D696 Thrombocytopenia, unspecified: Secondary | ICD-10-CM | POA: Insufficient documentation

## 2019-01-23 LAB — CBC WITH DIFFERENTIAL/PLATELET
Abs Immature Granulocytes: 0.01 10*3/uL (ref 0.00–0.07)
Basophils Absolute: 0.1 10*3/uL (ref 0.0–0.1)
Basophils Relative: 1 %
Eosinophils Absolute: 0.2 10*3/uL (ref 0.0–0.5)
Eosinophils Relative: 3 %
HCT: 42.8 % (ref 36.0–46.0)
Hemoglobin: 13.8 g/dL (ref 12.0–15.0)
Immature Granulocytes: 0 %
Lymphocytes Relative: 23 %
Lymphs Abs: 1.3 10*3/uL (ref 0.7–4.0)
MCH: 29.8 pg (ref 26.0–34.0)
MCHC: 32.2 g/dL (ref 30.0–36.0)
MCV: 92.4 fL (ref 80.0–100.0)
Monocytes Absolute: 0.3 10*3/uL (ref 0.1–1.0)
Monocytes Relative: 6 %
Neutro Abs: 3.8 10*3/uL (ref 1.7–7.7)
Neutrophils Relative %: 67 %
Platelets: 149 10*3/uL — ABNORMAL LOW (ref 150–400)
RBC: 4.63 MIL/uL (ref 3.87–5.11)
RDW: 12.1 % (ref 11.5–15.5)
WBC: 5.7 10*3/uL (ref 4.0–10.5)
nRBC: 0 % (ref 0.0–0.2)

## 2019-01-23 LAB — FERRITIN: Ferritin: 22 ng/mL (ref 11–307)

## 2019-01-23 LAB — IRON AND TIBC
Iron: 60 ug/dL (ref 28–170)
Saturation Ratios: 14 % (ref 10.4–31.8)
TIBC: 421 ug/dL (ref 250–450)
UIBC: 361 ug/dL

## 2019-01-23 NOTE — Assessment & Plan Note (Addendum)
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  S/p IV Venofer x3 significant improvement in the fatigue.  # HOLD Venofer-hemoglobin 13.8.  Asymptomatic.  # B12 malabsorption-recent B12 levels normal; continue oral B12 [intolerance to SL];   #Vitamin D deficiency-malabsorption;on vitamin D 5000 units.  Stable.  # Mild thrombocytopenia-149 question ITP versus others-discussed is inconsequential.  Will monitor for now.  # # I discussed regarding Covid-19 precautions.  I reviewed the vaccine effectiveness and potential side effects in detail.  Also discussed long-term effectiveness and safety profile are unclear at this time.  I discussed December, 2020 [facility] position statement-that all patients are recommended COVID-19 vaccinations [when available]-as long as they do not have allergy to components of the vaccine.  However, I think the benefits of the vaccination outweigh the potential risks. Re: [id number] vaccination.    # DISPOSITION: mychart # HOLD VENOFER TODAY # follow up in 6 months MD- labs-cbc/b12/ iron studies/ferrtin possible VENOFER- Dr.B

## 2019-01-23 NOTE — Patient Instructions (Signed)
#   Covid- 19 vaccine- For more information/scheduling recommend call [facility] [phone], 8:30am-4:30pm.

## 2019-01-23 NOTE — Progress Notes (Signed)
[facility] CONSULT NOTE  Patient Care Team: Olin Hauser, DO as PCP - General (Family Medicine)  CHIEF COMPLAINTS/PURPOSE OF CONSULTATION: Iron deficiency   HEMATOLOGY HISTORY  # ANEMIA IRON DEFICIENCY GASTRIC BYPASS [2005 [place]] EGD-none; colonoscopy-2015- few polyps snared [[place]]; IV venofer x3; fall 2020/ PO B12 [intol to SL b12]   HISTORY OF PRESENTING ILLNESS:  Ashley Fields 64 y.o.  female history of iron deficiency secondary to gastric bypass is here for follow-up.  Patient denies any new shortness of breath or worsening fatigue.  No blood in stools black or stools.  No nausea no vomiting no abdominal pain.  No fevers or chills. Review of Systems  Constitutional: Negative for chills, diaphoresis, fever and weight loss.  HENT: Negative for nosebleeds and sore throat.   Eyes: Negative for double vision.  Respiratory: Negative for cough, hemoptysis, sputum production and wheezing.   Cardiovascular: Negative for chest pain, palpitations, orthopnea and leg swelling.  Gastrointestinal: Negative for abdominal pain, blood in stool, constipation, diarrhea, heartburn, melena, nausea and vomiting.  Genitourinary: Negative for dysuria, frequency and urgency.  Musculoskeletal: Negative for back pain and joint pain.  Skin: Negative.  Negative for itching and rash.  Neurological: Negative for dizziness, tingling, focal weakness, weakness and headaches.  Endo/Heme/Allergies: Does not bruise/bleed easily.  Psychiatric/Behavioral: Negative for depression. The patient is not nervous/anxious and does not have insomnia.     MEDICAL HISTORY:  Past Medical History:  Diagnosis Date  . Allergy   . Anemia   . Anxiety   . Arthritis    Yes  . Blood transfusion without reported diagnosis 1977   Transfusions from miscarriage & hemorrhaging  . Chronic kidney disease   . Depression   . Disp fx of cuboid bone of right foot, init for clos fx 01/01/2017  . GERD  (gastroesophageal reflux disease)   . Headache   . Hepatitis C   . Hyperlipidemia   . Neuromuscular disorder ([place]) 1997   Falling to much was an eye-opener  . Osteoporosis   . Thyroid disease   . Urine incontinence     SURGICAL HISTORY: Past Surgical History:  Procedure Laterality Date  . 5 miscarriages     1977-1985, Blood transfusion s/p miscarriage 1977  . ABDOMINAL HYSTERECTOMY  1987   Total  . APPENDECTOMY  12/2008  . AUGMENTATION MAMMAPLASTY Bilateral 2015   Bilat  . AUGMENTATION MAMMAPLASTY Bilateral 2018   implants redone w/ placement of implants under muscle  . breast lift bilateral, implants  0000000   bilateral, silicon naturel  . BREAST REDUCTION SURGERY Bilateral 1997  . CESAREAN SECTION  07/27/1981   Placenta Previa  . CHOLECYSTECTOMY  03/2004   Lap surgery  . COSMETIC SURGERY     Breast implants w/ lift, tummy tuck, upper & lower Blepharop  . EYE SURGERY  2017   Cataract surgery & lasik  . GASTRIC BYPASS  2005   Laparoscopic  . IVC FILTER INSERTION  12/2003   TrapEase Vena Cava Filter  . LUMBAR LAMINECTOMY  06/2006   L4-L5 (spinal fusion)  . mini tummy tuck  05/07/2016   Bilateral bra/back roll lift skin removal  . neck surgery C3-C7  02/10/2015   ACDF  . REDUCTION MAMMAPLASTY Bilateral 1997  . SHOULDER ACROMIOPLASTY Left 03/27/2013   w/ labral debridement  . SMALL INTESTINE SURGERY  12/24/2003   Lap Gastric Bypass  . SPINAL CORD STIMULATOR IMPLANT  11/2010   removed 05/2011  . [place] SURGERY  2008 2017  Fusion L4-L5, ACDF C4-C7    SOCIAL HISTORY: Social History   Socioeconomic History  . Marital status: Significant Other    Spouse name: Not on file  . Number of children: 1  . Years of education: [employer]  . Highest education level: Some college, no degree  Occupational History  . Not on file  Tobacco Use  . Smoking status: Former Smoker    Packs/day: 0.25    Years: 10.00    Pack years: 2.50    Types: Cigarettes    Quit date:  11/29/1995    Years since quitting: 23.1  . Smokeless tobacco: Former Systems developer  . Tobacco comment: Quite 1997, didnt smoke hardly at all when I was smoking.  Substance and Sexual Activity  . Alcohol use: No  . Drug use: No  . Sexual activity: Yes    Birth control/protection: Condom, Post-menopausal, Surgical    Comment: Total Hysterectomy condoms  Other Topics Concern  . 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 Difficulty of Paying Living Expenses: Not very hard  Food Insecurity: No Food Insecurity  . Worried About Charity fundraiser in the Last Year: Never true  . Ran Out of Food in the Last Year: Never true  Transportation Needs: No Transportation Needs  . Lack of Transportation (Medical): No  . Lack of Transportation (Non-Medical): No  Physical Activity: Insufficiently Active  . Days of Exercise per Week: 5 days  . Minutes of Exercise per Session: 20 min  Stress:   . Feeling of Stress : Not on file  Social Connections: Unknown  . Frequency of Communication with Friends and Family: Not on file  . Frequency of Social Gatherings with Friends and Family: Not on file  . Attends Religious Services: Never  . Active Member of Clubs or Organizations: No  . Attends Archivist Meetings: Never  . Marital Status: Widowed  Intimate Partner Violence: Not At Risk  . Fear of Current or Ex-Partner: No  . Emotionally Abused: No  . Physically Abused: No  . Sexually Abused: No    FAMILY HISTORY: Family History  Problem Relation Age of Onset  . COPD Mother   . Lung cancer Mother   . Diabetes Mother   . Heart disease Mother   . Stroke Mother   . Heart attack Mother   . Arthritis Mother   . Cancer Mother   . Hypertension Mother   . Obesity Mother   . Varicose Veins Mother   . Heart disease Father    . Heart attack Father 61  . Early death Father   . Depression Sister        x 5 sisters  . COPD Sister   . Hypertension Sister   . Heart disease Brother        x 3 brothers  . Diabetes Brother   . Colon polyps Sister   . Hearing loss Sister   . Heart attack Sister   . Heart attack Brother   . Arthritis Sister   . Varicose Veins Sister   . Arthritis Brother   . Heart disease Brother   . Hypertension Brother   . Cancer Maternal Grandfather   . Stroke Maternal Grandfather   . COPD Sister   . Obesity Sister   . Depression Sister   .  Depression Sister   . Heart disease Sister   . Hypertension Sister   . Diabetes Brother   . Heart disease Brother   . Hypertension Brother   . Obesity Brother   . Varicose Veins Brother   . Heart disease Brother   . Hypertension Brother   . Obesity Brother   . Hypertension Sister   . Obesity Sister   . Miscarriages / Stillbirths Maternal Aunt   . Miscarriages / Stillbirths Paternal Aunt   . Breast cancer Neg Hx     ALLERGIES:  is allergic to flagyl [metronidazole]; cymbalta [duloxetine hcl]; and tape.  MEDICATIONS:  Current Outpatient Medications  Medication Sig Dispense Refill  . ARIPiprazole (ABILIFY) 20 MG tablet Take 1 tablet (20 mg total) by mouth daily. 90 tablet 0  . aspirin 81 MG EC tablet TAKE 1 TABLET BY MOUTH EVERY DAY 90 tablet 3  . Biotin 10000 MCG TABS Take 10,000 mcg by mouth 1 day or 1 dose.    . butalbital-acetaminophen-caffeine (FIORICET) 50-325-40 MG tablet Take 1 tablet by mouth 2 (two) times daily as needed for headache.    . estradiol (ESTRACE) 1 MG tablet TAKE 1 TABLET BY MOUTH EVERY DAY 90 tablet 1  . etodolac (LODINE) 500 MG tablet Take 1 tablet (500 mg total) by mouth 2 (two) times daily. 180 tablet 1  . ezetimibe (ZETIA) 10 MG tablet TAKE ONE TABLET BY MOUTH ONE TIME DAILY  90 tablet 1  . ferrous sulfate 325 (65 FE) MG tablet ferrous sulfate 325 mg (65 mg iron) tablet  TAKE 1 TABLET BY MOUTH DAILY    .  FERROUS SULFATE-FOLIC ACID PO Take 1 tablet by mouth 2 (two) times daily.     . fluticasone (FLONASE) 50 MCG/ACT nasal spray SPRAY 2 SPRAYS INTO EACH NOSTRIL EVERY DAY 48 mL 3  . furosemide (LASIX) 20 MG tablet TAKE 1 TABLET (20 MG TOTAL) BY MOUTH DAILY AS NEEDED FOR FLUID OR EDEMA. 90 tablet 0  . gabapentin (NEURONTIN) 300 MG capsule Take 1 capsule (300 mg total) by mouth 3 (three) times daily. 270 capsule 3  . Magnesium 500 MG CAPS magnesium oxide 500 mg capsule  TAKE 1 CAPSULE (500 MG TOTAL) BY MOUTH 2 (TWO) TIMES DAILY AT 8 AM AND 10 PM.    . Melatonin 10 MG CAPS Take 2 capsules by mouth at bedtime.     . Multiple Vitamin (MULTIVITAMIN WITH MINERALS) TABS tablet Take 1 tablet by mouth daily.    Marland Kitchen omeprazole (PRILOSEC) 40 MG capsule TAKE 1 CAPSULE BY MOUTH EVERY DAY 90 capsule 3  . ondansetron (ZOFRAN ODT) 4 MG disintegrating tablet Take 1 tablet (4 mg total) by mouth every 8 (eight) hours as needed for nausea or vomiting. 30 tablet 2  . oxyCODONE (OXY IR/ROXICODONE) 5 MG immediate release tablet TAKE 1 TABLET BY MOUTH EVERY 8 HOURS FILL ON DNF PRIOR TO 04/29/2018    . phentermine (ADIPEX-P) 37.5 MG tablet Take 37.5 mg by mouth daily.    . propranolol (INDERAL) 10 MG tablet Take 1 tablet (10 mg total) by mouth 3 (three) times daily as needed. Severe anxiety attacks 270 tablet 1  . rosuvastatin (CRESTOR) 40 MG tablet Take 1 tablet (40 mg total) by mouth daily. 90 tablet 0  . SUMAtriptan (IMITREX) 100 MG tablet Take 0.5 tablets (50 mg total) by mouth once as needed for up to 1 dose for migraine. May repeat one dose in 2 hours if headache persists, for max dose 24 hours  9 tablet 2  . tiZANidine (ZANAFLEX) 4 MG tablet Take 1 tablet (4 mg total) by mouth 3 (three) times daily. (Patient taking differently: Take 4 mg by mouth 4 (four) times daily. ) 90 tablet 1  . traZODone (DESYREL) 150 MG tablet TAKE 1 TABLET (150 MG TOTAL) BY MOUTH AT BEDTIME. 90 tablet 1  . venlafaxine XR (EFFEXOR XR) 75 MG 24 hr  capsule Take 1 capsule (75 mg total) by mouth daily with breakfast. 90 capsule 0  . vitamin E 100 UNIT capsule Take 100 mg by mouth daily.     No current facility-administered medications for this visit.      PHYSICAL EXAMINATION:   Vitals:   01/23/19 1319  BP: (!) 128/93  Pulse: (!) 54  Resp: 20  Temp: (!) 96.3 F (35.7 C)   Filed Weights   01/23/19 1319  Weight: 198 lb (89.8 kg)    Physical Exam  Constitutional: She is oriented to person, place, and time and well-developed, well-nourished, and in no distress.  HENT:  Head: Normocephalic and atraumatic.  Mouth/Throat: Oropharynx is clear and moist. No oropharyngeal exudate.  Eyes: Pupils are equal, round, and reactive to light.  Cardiovascular: Normal rate and regular rhythm.  Pulmonary/Chest: Effort normal and breath sounds normal. No respiratory distress. She has no wheezes.  Abdominal: Soft. Bowel sounds are normal. She exhibits no distension and no mass. There is no abdominal tenderness. There is no rebound and no guarding.  Musculoskeletal:        General: No tenderness or edema. Normal range of motion.     Cervical back: Normal range of motion and neck supple.  Neurological: She is alert and oriented to person, place, and time.  Skin: Skin is warm.  Psychiatric: Affect normal.    LABORATORY DATA:  I have reviewed the data as listed Lab Results  Component Value Date   WBC 5.7 01/23/2019   HGB 13.8 01/23/2019   HCT 42.8 01/23/2019   MCV 92.4 01/23/2019   PLT 149 (L) 01/23/2019   Recent Labs    03/10/18 1111 08/11/18 0804 11/20/18 0840  NA 143 144 142  K 5.2 5.2 4.3  CL 107 108 105  CO2 30 29 28   GLUCOSE 100* 95 86  BUN 17 16 18   CREATININE 0.93 0.79 0.99  CALCIUM 9.2 9.0 9.1  GFRNONAA 66 80 61  GFRAA 77 93 71  PROT 6.8 6.5  --   AST 28 22  --   ALT 21 16  --   BILITOT 0.5 0.7  --      No results found.  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  S/p IV Venofer x3 significant improvement in the fatigue.  # HOLD Venofer-hemoglobin 13.8.  Asymptomatic.  # B12 malabsorption-recent B12 levels normal; continue oral B12 [intolerance to SL];   #Vitamin D deficiency-malabsorption;on vitamin D 5000 units.  Stable.  # Mild thrombocytopenia-149 question ITP versus others-discussed is inconsequential.  Will monitor for now.  # # I discussed regarding Covid-19 precautions.  I reviewed the vaccine effectiveness and potential side effects in detail.  Also discussed long-term effectiveness and safety profile are unclear at this time.  I discussed December, 2020 ASCO position statement-that all patients are recommended COVID-19 vaccinations [when available]-as long as they do not have allergy to components of the vaccine.  However, I think the benefits of the vaccination outweigh the potential risks. Re: [id number] vaccination.    #  DISPOSITION: mychart # HOLD VENOFER TODAY # follow up in 6 months MD- labs-cbc/b12/ iron studies/ferrtin possible VENOFER- Dr.B  All questions were answered. The patient knows to call the clinic with any problems, questions or concerns.    Cammie Sickle, MD 01/26/2019 8:02 AM

## 2019-01-25 ENCOUNTER — Other Ambulatory Visit: Payer: Self-pay | Admitting: Family Medicine

## 2019-01-25 DIAGNOSIS — F4310 Post-traumatic stress disorder, unspecified: Secondary | ICD-10-CM

## 2019-01-25 DIAGNOSIS — F5104 Psychophysiologic insomnia: Secondary | ICD-10-CM

## 2019-01-25 DIAGNOSIS — Z90710 Acquired absence of both cervix and uterus: Secondary | ICD-10-CM

## 2019-02-06 ENCOUNTER — Other Ambulatory Visit: Payer: Self-pay

## 2019-02-06 ENCOUNTER — Encounter: Payer: Self-pay | Admitting: Family Medicine

## 2019-02-06 ENCOUNTER — Ambulatory Visit (INDEPENDENT_AMBULATORY_CARE_PROVIDER_SITE_OTHER): Payer: 59 | Admitting: Family Medicine

## 2019-02-06 VITALS — BP 101/53 | HR 65 | Temp 97.7°F | Resp 16 | Ht 62.0 in | Wt 198.6 lb

## 2019-02-06 DIAGNOSIS — G8929 Other chronic pain: Secondary | ICD-10-CM | POA: Diagnosis not present

## 2019-02-06 DIAGNOSIS — Z9884 Bariatric surgery status: Secondary | ICD-10-CM | POA: Diagnosis not present

## 2019-02-06 DIAGNOSIS — M5441 Lumbago with sciatica, right side: Secondary | ICD-10-CM | POA: Diagnosis not present

## 2019-02-06 DIAGNOSIS — R635 Abnormal weight gain: Secondary | ICD-10-CM

## 2019-02-06 DIAGNOSIS — H43813 Vitreous degeneration, bilateral: Secondary | ICD-10-CM | POA: Diagnosis not present

## 2019-02-06 DIAGNOSIS — H04123 Dry eye syndrome of bilateral lacrimal glands: Secondary | ICD-10-CM | POA: Diagnosis not present

## 2019-02-06 DIAGNOSIS — E782 Mixed hyperlipidemia: Secondary | ICD-10-CM | POA: Diagnosis not present

## 2019-02-06 DIAGNOSIS — E669 Obesity, unspecified: Secondary | ICD-10-CM | POA: Diagnosis not present

## 2019-02-06 DIAGNOSIS — H16223 Keratoconjunctivitis sicca, not specified as Sjogren's, bilateral: Secondary | ICD-10-CM | POA: Diagnosis not present

## 2019-02-06 DIAGNOSIS — G894 Chronic pain syndrome: Secondary | ICD-10-CM

## 2019-02-06 DIAGNOSIS — M5442 Lumbago with sciatica, left side: Secondary | ICD-10-CM

## 2019-02-06 DIAGNOSIS — H26493 Other secondary cataract, bilateral: Secondary | ICD-10-CM | POA: Diagnosis not present

## 2019-02-06 MED ORDER — ETODOLAC 500 MG PO TABS: 500.0000 mg | ORAL_TABLET | Freq: Two times a day (BID) | ORAL | 1 refills | Status: DC

## 2019-02-06 MED ORDER — GABAPENTIN 300 MG PO CAPS: 300.0000 mg | ORAL_CAPSULE | Freq: Three times a day (TID) | ORAL | 3 refills | Status: DC

## 2019-02-06 MED ORDER — EZETIMIBE 10 MG PO TABS: 10.0000 mg | ORAL_TABLET | Freq: Every day | ORAL | 1 refills | Status: DC

## 2019-02-06 MED ORDER — CONTRAVE 8-90 MG PO TB12: ORAL_TABLET | ORAL | 0 refills | Status: DC

## 2019-02-06 NOTE — Patient Instructions (Addendum)
Thank you for coming to the office today.  [facility] in [place]. If need approval we can try, otherwise use your coverage and goodrx for cash pay. Goal is to take for 3 months. Dose increase schedule on rx for 1 month then 2 pills twice a day for 2 months after, will need new rx let me know if working.  Please schedule a Follow-up Appointment to: Return in about 4 weeks (around 03/06/2019) for weight management.  If you have any other questions or concerns, please feel free to call the office or send a message through [place]. You may also schedule an earlier appointment if necessary.  Additionally, you may be receiving a survey about your experience at our office within a few days to 1 week by e-mail or mail. We value your feedback.  Nobie Putnam, DO [place]

## 2019-02-06 NOTE — Telephone Encounter (Signed)
Patient seen for face to face apt already today  Ashley Fields, [facility] 02/06/2019, 9:53 AM

## 2019-02-06 NOTE — Progress Notes (Signed)
Subjective:    Patient ID: Ashley Fields, female    DOB: 29-Dec-1955, 64 y.o.   MRN: [id number]  Ashley Fields is a 64 y.o. female presenting on 02/06/2019 for Obesity   HPI   Obesity BMI >38 / Abnormal Weight Gain. Chronic problem with abnormal weight gain worse in past >12 months +20 lbs gain. She has history of prior significant morbid obesity and s/p gastric bypass surgery in 2005. She had done well overall but still has had some issues with continued weight gain over the years. Now recent had worsening. Today interested in medication options for weight loss. She is on variety of other medications for her mood currently and followed by Dr Shea Evans at [facility]. - For lifestyle, she continues to limit portions / calories, reduce starch/fats in diet and has tried to exercise but has had some limitations on exercise recently. - in addition she has tried weight management programs in past with [employer], Weight Watchers, Skinny Youth, Nutrisystem, Atkins Diet - Interested in [place] for weight loss. She has taken Wellbutrin in the past. She has no history of Pre-Diabetes or Diabetes.  Hyperlipidemia Chronic problem. Complication of obesity as well. On Rosuvastatin and Zetia currently, needs refill. Followed by Cardiology  Chronic Pain Syndrome Request refill Gabapentin, Etolodac.  Depression screen [facility] 2/9 12/16/2018 11/20/2018 09/09/2018  Decreased Interest 0 1 0  Down, Depressed, Hopeless 0 1 0  PHQ - 2 Score 0 2 0  Altered sleeping - 0 1  Tired, decreased energy - 1 1  Change in appetite - 2 0  Feeling bad or failure about yourself  - 0 0  Trouble concentrating - 0 0  Moving slowly or fidgety/restless - 1 1  Suicidal thoughts - 0 0  PHQ-9 Score - 6 3  Difficult doing work/chores - Somewhat difficult Not difficult at all  Some recent data might be hidden   Past Surgical History:  Procedure Laterality Date  . 5 miscarriages     1977-1985, Blood transfusion s/p miscarriage  1977  . ABDOMINAL HYSTERECTOMY  1987   Total  . APPENDECTOMY  12/2008  . AUGMENTATION MAMMAPLASTY Bilateral 2015   Bilat  . AUGMENTATION MAMMAPLASTY Bilateral 2018   implants redone w/ placement of implants under muscle  . breast lift bilateral, implants  0000000   bilateral, silicon naturel  . BREAST REDUCTION SURGERY Bilateral 1997  . CESAREAN SECTION  07/27/1981   Placenta Previa  . CHOLECYSTECTOMY  03/2004   Lap surgery  . COSMETIC SURGERY     Breast implants w/ lift, tummy tuck, upper & lower Blepharop  . EYE SURGERY  2017   Cataract surgery & lasik  . GASTRIC BYPASS  2005   Laparoscopic  . IVC FILTER INSERTION  12/2003   TrapEase Vena Cava Filter  . LUMBAR LAMINECTOMY  06/2006   L4-L5 (spinal fusion)  . mini tummy tuck  05/07/2016   Bilateral bra/back roll lift skin removal  . neck surgery C3-C7  02/10/2015   ACDF  . REDUCTION MAMMAPLASTY Bilateral 1997  . SHOULDER ACROMIOPLASTY Left 03/27/2013   w/ labral debridement  . SMALL INTESTINE SURGERY  12/24/2003   Lap Gastric Bypass  . SPINAL CORD STIMULATOR IMPLANT  11/2010   removed 05/2011  . SPINE SURGERY  2008 2017   Fusion L4-L5, ACDF C4-C7     Social History   Tobacco Use  . Smoking status: Former Smoker    Packs/day: 0.25    Years: 10.00    Pack years:  2.50    Types: Cigarettes    Quit date: 11/29/1995    Years since quitting: 23.2  . Smokeless tobacco: Former Systems developer  . Tobacco comment: Quite 1997, didnt smoke hardly at all when I was smoking.  Substance Use Topics  . Alcohol use: No  . Drug use: No    Review of Systems Per HPI unless specifically indicated above     Objective:    BP (!) 101/53   Pulse 65   Temp 97.7 F (36.5 C) (Oral)   Resp 16   Ht 5\' 2"  (1.575 m)   Wt 198 lb 9.6 oz (90.1 kg)   BMI 36.32 kg/m   Wt Readings from Last 3 Encounters:  02/06/19 198 lb 9.6 oz (90.1 kg)  01/23/19 198 lb (89.8 kg)  12/16/18 197 lb (89.4 kg)    Physical Exam Vitals and nursing note  reviewed.  Constitutional:      General: She is not in acute distress.    Appearance: She is well-developed. She is not diaphoretic.     Comments: Well-appearing, comfortable, cooperative, obese  HENT:     Head: Normocephalic and atraumatic.  Eyes:     General:        Right eye: No discharge.        Left eye: No discharge.     Conjunctiva/sclera: Conjunctivae normal.  Cardiovascular:     Rate and Rhythm: Normal rate.  Pulmonary:     Effort: Pulmonary effort is normal.  Skin:    General: Skin is warm and dry.     Findings: No erythema or rash.  Neurological:     Mental Status: She is alert and oriented to person, place, and time.  Psychiatric:        Behavior: Behavior normal.     Comments: Well groomed, good eye contact, normal speech and thoughts    Results for orders placed or performed in visit on 01/23/19  Iron and TIBC  Result Value Ref Range   Iron 60 28 - 170 ug/dL   TIBC 421 250 - 450 ug/dL   Saturation Ratios 14 10.4 - 31.8 %   UIBC 361 ug/dL  Ferritin  Result Value Ref Range   Ferritin 22 11 - 307 ng/mL  CBC with Differential  Result Value Ref Range   WBC 5.7 4.0 - 10.5 K/uL   RBC 4.63 3.87 - 5.11 MIL/uL   Hemoglobin 13.8 12.0 - 15.0 g/dL   HCT 42.8 36.0 - 46.0 %   MCV 92.4 80.0 - 100.0 fL   MCH 29.8 26.0 - 34.0 pg   MCHC 32.2 30.0 - 36.0 g/dL   RDW 12.1 11.5 - 15.5 %   Platelets 149 (L) 150 - 400 K/uL   nRBC 0.0 0.0 - 0.2 %   Neutrophils Relative % 67 %   Neutro Abs 3.8 1.7 - 7.7 K/uL   Lymphocytes Relative 23 %   Lymphs Abs 1.3 0.7 - 4.0 K/uL   Monocytes Relative 6 %   Monocytes Absolute 0.3 0.1 - 1.0 K/uL   Eosinophils Relative 3 %   Eosinophils Absolute 0.2 0.0 - 0.5 K/uL   Basophils Relative 1 %   Basophils Absolute 0.1 0.0 - 0.1 K/uL   Immature Granulocytes 0 %   Abs Immature Granulocytes 0.01 0.00 - 0.07 K/uL      Assessment & Plan:   Problem List Items Addressed This Visit    S/P gastric bypass   Relevant Medications   CONTRAVE 8-90  MG  TB12   Obesity (BMI 30-39.9) - Primary   Relevant Medications   CONTRAVE 8-90 MG TB12   Mixed hyperlipidemia   Relevant Medications   ezetimibe (ZETIA) 10 MG tablet   Chronic pain syndrome (Chronic)   Relevant Medications   gabapentin (NEURONTIN) 300 MG capsule   etodolac (LODINE) 500 MG tablet   Chronic Low back pain (Primary Area of Pain) (Bilateral)  (R>L) (Chronic)   Relevant Medications   CONTRAVE 8-90 MG TB12   gabapentin (NEURONTIN) 300 MG capsule   etodolac (LODINE) 500 MG tablet    Other Visit Diagnoses    Abnormal weight gain       Relevant Medications   CONTRAVE 8-90 MG TB12      Chronic abnormal weight gain, difficulty managing weight +20 lbs gain in >12 months S/p Lap Gastric Bypass in 2005 See HPI for other weight management programs tried  Trial now on Contrave rx titration up slow over 1 month, 1 daily, then 1 BID, then 2 AM / 1 PM, then 2 BID, #70 pills, caution mood changes on med, also on pain medication can affect or reduce results.  If effective for weight loss, will renew rx for 2 pills BID, for 2 more months max 3 month course or 12 weeks, goal >5% weight loss, and allow her to resume previous natural weight loss attempts as well.  Reviewed medication rx with Dr Ferdie Ping Psychiatry, who will also follow along in future for her other mood medications.  Follow-up as needed. If not covered, will use goodrx program.  Meds ordered this encounter  Medications  . CONTRAVE 8-90 MG TB12    Sig: Start 1 tab daily. Increase dose every 7 days - next 1 tab twice daily, then 2 tab in AM, 1 tab in PM, then to max dose 2 tab twice daily    Dispense:  70 tablet    Refill:  0  . gabapentin (NEURONTIN) 300 MG capsule    Sig: Take 1 capsule (300 mg total) by mouth 3 (three) times daily.    Dispense:  270 capsule    Refill:  3  . etodolac (LODINE) 500 MG tablet    Sig: Take 1 tablet (500 mg total) by mouth 2 (two) times daily.    Dispense:  180 tablet     Refill:  1  . ezetimibe (ZETIA) 10 MG tablet    Sig: Take 1 tablet (10 mg total) by mouth daily.    Dispense:  90 tablet    Refill:  1      Follow up plan: Return in about 4 weeks (around 03/06/2019) for weight management.   Nobie Putnam, [facility] 02/06/2019, 8:39 AM

## 2019-02-13 ENCOUNTER — Other Ambulatory Visit: Payer: Self-pay

## 2019-02-13 ENCOUNTER — Encounter: Payer: Self-pay | Admitting: Family Medicine

## 2019-02-13 ENCOUNTER — Ambulatory Visit (INDEPENDENT_AMBULATORY_CARE_PROVIDER_SITE_OTHER): Payer: 59 | Admitting: Family Medicine

## 2019-02-13 VITALS — BP 117/68 | Temp 98.8°F

## 2019-02-13 DIAGNOSIS — J9801 Acute bronchospasm: Secondary | ICD-10-CM | POA: Diagnosis not present

## 2019-02-13 DIAGNOSIS — Z20822 Contact with and (suspected) exposure to covid-19: Secondary | ICD-10-CM | POA: Diagnosis not present

## 2019-02-13 DIAGNOSIS — B349 Viral infection, unspecified: Secondary | ICD-10-CM

## 2019-02-13 MED ORDER — ALBUTEROL SULFATE HFA 108 (90 BASE) MCG/ACT IN AERS: 2.0000 | INHALATION_SPRAY | RESPIRATORY_TRACT | 2 refills | Status: DC | PRN

## 2019-02-13 MED ORDER — PREDNISONE 50 MG PO TABS: 50.0000 mg | ORAL_TABLET | Freq: Every day | ORAL | 0 refills | Status: DC

## 2019-02-13 MED ORDER — PSEUDOEPH-BROMPHEN-DM 30-2-10 MG/5ML PO SYRP: 5.0000 mL | ORAL_SOLUTION | Freq: Three times a day (TID) | ORAL | 0 refills | Status: DC | PRN

## 2019-02-13 NOTE — Telephone Encounter (Signed)
Treated w/ virtual visit today 02/13/19  Nobie Putnam, [facility] 02/13/2019, 4:40 PM

## 2019-02-13 NOTE — Patient Instructions (Addendum)
Use prednisone for 5 days Use albuterol inhaler as needed Use cough syrup as needed  REQUIRED self quarantine to [place] - advised to avoid all exposure with others while during TESTING (Pending result) and treatment. Should continue to quarantine for up to 7-14 days - pending resolution of symptoms, if TEST IS NEGATIVE and symptoms resolve by 7 days and is afebrile >3 days - may STOP self quarantine at that time. IF test is POSITIVE then will require 10 additional day quarantine after date of positive test result.  If symptoms do not resolve or significantly improve OR if WORSENING - fever / cough - or worsening shortness of breath - then should contact us and seek advice on next steps in treatment at home vs where/when to seek care at Urgent Care or Hospital ED for further intervention and possible testing if indicated.   Please schedule a Follow-up Appointment to: Return in about 1 week (around 02/20/2019), or if symptoms worsen or fail to improve, for viral syndrome possible covid.  If you have any other questions or concerns, please feel free to call the office or send a message through [place]. You may also schedule an earlier appointment if necessary.  Additionally, you may be receiving a survey about your experience at our office within a few days to 1 week by e-mail or mail. We value your feedback.  Nobie Putnam, DO [place]

## 2019-02-13 NOTE — Progress Notes (Signed)
Virtual Visit via Telephone The purpose of this virtual visit is to provide medical care while limiting exposure to the novel coronavirus (COVID19) for both patient and office staff.  Consent was obtained for phone visit:  Yes.   Answered questions that patient had about telehealth interaction:  Yes.   I discussed the limitations, risks, security and privacy concerns of performing an evaluation and management service by telephone. I also discussed with the patient that there may be a patient responsible charge related to this service. The patient expressed understanding and agreed to proceed.  Patient Location: Home Provider Location: [facility])   ---------------------------------------------------------------------- Chief Complaint  Patient presents with  . Headache    Pounding headache, difficulty with breathing, diarrhea,  sore throat,  bilateral ears discomfort and body aches x 1 day     S: Reviewed CMA documentation. I have called patient and gathered additional HPI as follows:  SUSPECTED COVID19 / Headache / Dyspnea Cough Reports that symptoms started 1 day ago. She was asked to get COVID testing. [facility] went [facility] today. Pending result. Still having symptoms persistent headache, cough, dyspnea some tightness in breathing worse at night, sinus pressure ear pain. General body aches. -s/p Flu vaccine 08/2018   Denies any high risk travel to areas of current concern for COVID19. Denies any known or suspected exposure to person with or possibly with COVID19.  Denies any fevers, chills, sweats, abdominal pain, diarrhea  -------------------------------------------------------------------------- O: No physical exam performed due to remote telephone encounter.  -------------------------------------------------------------------------- A&P: Suspected COVID19 vs other viral syndrome based on current history Pending COVID19 test result from [facility] patient will  notify us of result  Clinically not entirely consistent with influenza, considered empiric treatment, she is s/p flu vaccine will hold off on treatment today for this.  Treat symptoms bronchospasm / cough - Prednisone 50 daily x 5 days - Albuterol PRN - Cough syrup Return criteria reviewed If neg covid can consider flu treatment  Meds ordered this encounter  Medications  . albuterol (VENTOLIN HFA) 108 (90 Base) MCG/ACT inhaler    Sig: Inhale 2 puffs into the lungs every 4 (four) hours as needed for wheezing or shortness of breath (cough).    Dispense:  8 g    Refill:  2  . brompheniramine-pseudoephedrine-DM 30-2-10 MG/5ML syrup    Sig: Take 5 mLs by mouth 3 (three) times daily as needed.    Dispense:  118 mL    Refill:  0  . predniSONE (DELTASONE) 50 MG tablet    Sig: Take 1 tablet (50 mg total) by mouth daily with breakfast.    Dispense:  5 tablet    Refill:  0    REQUIRED self quarantine to [place] - advised to avoid all exposure with others while during TESTING (Pending result) and treatment. Should continue to quarantine for up to 7-14 days - pending resolution of symptoms, if TEST IS NEGATIVE and symptoms resolve by 7 days and is afebrile >3 days - may STOP self quarantine at that time. IF test is POSITIVE then will require 10 additional day quarantine after date of positive test result.  If symptoms do not resolve or significantly improve OR if WORSENING - fever / cough - or worsening shortness of breath - then should contact us and seek advice on next steps in treatment at home vs where/when to seek care at Urgent Care or Hospital ED for further intervention and possible testing if indicated.  Patient verbalizes  understanding with the above medical recommendations including the limitation of remote medical advice.  Specific follow-up / call-back criteria were given for patient to follow-up or seek medical care more urgently if needed.   - Time spent in direct  consultation with patient on phone: 8 minutes  Nobie Putnam, [facility] 02/13/2019, 4:27 PM

## 2019-02-13 NOTE — Telephone Encounter (Signed)
I called the patient to f/u on her symptoms. She complains of a headache, chills, no fever, diarrhea,  sore throat,  ears pain, and whole body aches. I spoke with Dr. Raliegh Ip and he verbalize that the patient needs to be tested for COVID . The pt was offered a 4 o'clock virtual appointment or the option to go to the urgent care where she can be tested and evaluated today. The patient decided to go and be seen at the urgent care.

## 2019-02-26 ENCOUNTER — Other Ambulatory Visit: Payer: Self-pay | Admitting: Family Medicine

## 2019-02-26 DIAGNOSIS — Z9884 Bariatric surgery status: Secondary | ICD-10-CM

## 2019-02-26 DIAGNOSIS — R635 Abnormal weight gain: Secondary | ICD-10-CM

## 2019-02-26 DIAGNOSIS — E669 Obesity, unspecified: Secondary | ICD-10-CM

## 2019-02-26 DIAGNOSIS — Z90710 Acquired absence of both cervix and uterus: Secondary | ICD-10-CM

## 2019-03-02 ENCOUNTER — Ambulatory Visit: Payer: Medicare HMO | Admitting: Family Medicine

## 2019-03-11 DIAGNOSIS — M533 Sacrococcygeal disorders, not elsewhere classified: Secondary | ICD-10-CM | POA: Diagnosis not present

## 2019-03-11 DIAGNOSIS — M25552 Pain in left hip: Secondary | ICD-10-CM | POA: Diagnosis not present

## 2019-03-11 DIAGNOSIS — M479 Spondylosis, unspecified: Secondary | ICD-10-CM | POA: Diagnosis not present

## 2019-03-11 DIAGNOSIS — Z79899 Other long term (current) drug therapy: Secondary | ICD-10-CM | POA: Diagnosis not present

## 2019-03-11 DIAGNOSIS — M48061 Spinal stenosis, lumbar region without neurogenic claudication: Secondary | ICD-10-CM | POA: Diagnosis not present

## 2019-03-11 DIAGNOSIS — M545 Low back pain: Secondary | ICD-10-CM | POA: Diagnosis not present

## 2019-03-11 DIAGNOSIS — G894 Chronic pain syndrome: Secondary | ICD-10-CM | POA: Diagnosis not present

## 2019-03-14 ENCOUNTER — Other Ambulatory Visit: Payer: Self-pay | Admitting: Cardiovascular Disease

## 2019-03-14 ENCOUNTER — Other Ambulatory Visit: Payer: Self-pay | Admitting: Family Medicine

## 2019-03-14 DIAGNOSIS — R609 Edema, unspecified: Secondary | ICD-10-CM

## 2019-03-16 ENCOUNTER — Ambulatory Visit (INDEPENDENT_AMBULATORY_CARE_PROVIDER_SITE_OTHER): Payer: 59 | Admitting: Psychiatry

## 2019-03-16 ENCOUNTER — Encounter: Payer: Self-pay | Admitting: Psychiatry

## 2019-03-16 ENCOUNTER — Other Ambulatory Visit: Payer: Self-pay

## 2019-03-16 DIAGNOSIS — R69 Illness, unspecified: Secondary | ICD-10-CM | POA: Diagnosis not present

## 2019-03-16 DIAGNOSIS — F410 Panic disorder [episodic paroxysmal anxiety] without agoraphobia: Secondary | ICD-10-CM

## 2019-03-16 DIAGNOSIS — F4310 Post-traumatic stress disorder, unspecified: Secondary | ICD-10-CM

## 2019-03-16 DIAGNOSIS — F3176 Bipolar disorder, in full remission, most recent episode depressed: Secondary | ICD-10-CM | POA: Diagnosis not present

## 2019-03-16 MED ORDER — ARIPIPRAZOLE 20 MG PO TABS: 20.0000 mg | ORAL_TABLET | Freq: Every day | ORAL | 0 refills | Status: DC

## 2019-03-16 MED ORDER — VENLAFAXINE HCL ER 75 MG PO CP24: 75.0000 mg | ORAL_CAPSULE | Freq: Every day | ORAL | 0 refills | Status: DC

## 2019-03-16 NOTE — Progress Notes (Signed)
Provider Location : [facility] Patient Location : Home  Virtual Visit via Video Note  I connected with Ashley Fields on 03/16/19 at 10:00 AM EST by a video enabled telemedicine application and verified that I am speaking with the correct person using two identifiers.   I discussed the limitations of evaluation and management by telemedicine and the availability of in person appointments. The patient expressed understanding and agreed to proceed.    I discussed the assessment and treatment plan with the patient. The patient was provided an opportunity to ask questions and all were answered. The patient agreed with the plan and demonstrated an understanding of the instructions.   The patient was advised to call back or seek an in-person evaluation if the symptoms worsen or if the condition fails to improve as anticipated.   [facility] MD OP Progress Note  03/16/2019 10:18 AM Ashley Fields  MRN:  [id number]  Chief Complaint:  Chief Complaint    Follow-up     HPI: Ashley Fields is a 64 year old Caucasian female on disability, lives in [place], married, has a history of bipolar disorder, PTSD, chronic pain, migraine headaches, hypoglycemic episodes was evaluated by telemedicine today.  Patient today reports she is currently doing well with regards to her mood symptoms.  Patient denies any suicidality, homicidality or perceptual disturbances.  She reports sleep as good.  She continues to use trazodone and melatonin on and off.  She denies any sadness, crying spells, irritability.  She reports she is currently watching her diet and trying to exercise by going out for walks.  She reports she is on Contrave which definitely helps her and she denies any side effects.  She has lost 9 pounds and she is excited about that.  She reports she currently does not have any other concerns. Visit Diagnosis:    ICD-10-CM   1. Bipolar disorder, in full remission, most recent episode depressed (HCC)  F31.76 venlafaxine XR  (EFFEXOR XR) 75 MG 24 hr capsule    ARIPiprazole (ABILIFY) 20 MG tablet  2. PTSD (post-traumatic stress disorder)  F43.10 venlafaxine XR (EFFEXOR XR) 75 MG 24 hr capsule  3. Panic attacks  F41.0 venlafaxine XR (EFFEXOR XR) 75 MG 24 hr capsule    Past Psychiatric History: I have reviewed past psychiatric history from my progress note on 05/15/2018.  Past trials of Prozac, Wellbutrin, Lexapro, Cymbalta, Rexulti  Past Medical History:  Past Medical History:  Diagnosis Date  . Allergy   . Anemia   . Anxiety   . Arthritis    Yes  . Blood transfusion without reported diagnosis 1977   Transfusions from miscarriage & hemorrhaging  . Chronic kidney disease   . Depression   . Disp fx of cuboid bone of right foot, init for clos fx 01/01/2017  . GERD (gastroesophageal reflux disease)   . Headache   . Hepatitis C   . Hyperlipidemia   . Neuromuscular disorder ([place]) 1997   Falling to much was an eye-opener  . Osteoporosis   . Thyroid disease   . Urine incontinence     Past Surgical History:  Procedure Laterality Date  . 5 miscarriages     1977-1985, Blood transfusion s/p miscarriage 1977  . ABDOMINAL HYSTERECTOMY  1987   Total  . APPENDECTOMY  12/2008  . AUGMENTATION MAMMAPLASTY Bilateral 2015   Bilat  . AUGMENTATION MAMMAPLASTY Bilateral 2018   implants redone w/ placement of implants under muscle  . breast lift bilateral, implants  0000000   bilateral, silicon naturel  .  BREAST REDUCTION SURGERY Bilateral 1997  . CESAREAN SECTION  07/27/1981   Placenta Previa  . CHOLECYSTECTOMY  03/2004   Lap surgery  . COSMETIC SURGERY     Breast implants w/ lift, tummy tuck, upper & lower Blepharop  . EYE SURGERY  2017   Cataract surgery & lasik  . GASTRIC BYPASS  2005   Laparoscopic  . IVC FILTER INSERTION  12/2003   TrapEase Vena Cava Filter  . LUMBAR LAMINECTOMY  06/2006   L4-L5 (spinal fusion)  . mini tummy tuck  05/07/2016   Bilateral bra/back roll lift skin removal  . neck  surgery C3-C7  02/10/2015   ACDF  . REDUCTION MAMMAPLASTY Bilateral 1997  . SHOULDER ACROMIOPLASTY Left 03/27/2013   w/ labral debridement  . SMALL INTESTINE SURGERY  12/24/2003   Lap Gastric Bypass  . SPINAL CORD STIMULATOR IMPLANT  11/2010   removed 05/2011  . SPINE SURGERY  2008 2017   Fusion L4-L5, ACDF C4-C7    Family Psychiatric History: I have reviewed family psychiatric history from my progress note on 05/15/2018.  Family History:  Family History  Problem Relation Age of Onset  . COPD Mother   . Lung cancer Mother   . Diabetes Mother   . Heart disease Mother   . Stroke Mother   . Heart attack Mother   . Arthritis Mother   . Cancer Mother   . Hypertension Mother   . Obesity Mother   . Varicose Veins Mother   . Heart disease Father   . Heart attack Father 48  . Early death Father   . Depression Sister        x 5 sisters  . COPD Sister   . Hypertension Sister   . Heart disease Brother        x 3 brothers  . Diabetes Brother   . Colon polyps Sister   . Hearing loss Sister   . Heart attack Sister   . Heart attack Brother   . Arthritis Sister   . Varicose Veins Sister   . Arthritis Brother   . Heart disease Brother   . Hypertension Brother   . Cancer Maternal Grandfather   . Stroke Maternal Grandfather   . COPD Sister   . Obesity Sister   . Depression Sister   . Depression Sister   . Heart disease Sister   . Hypertension Sister   . Diabetes Brother   . Heart disease Brother   . Hypertension Brother   . Obesity Brother   . Varicose Veins Brother   . Heart disease Brother   . Hypertension Brother   . Obesity Brother   . Hypertension Sister   . Obesity Sister   . Miscarriages / Stillbirths Maternal Aunt   . Miscarriages / Stillbirths Paternal Aunt   . Breast cancer Neg Hx     Social History: I have reviewed social history from my progress note on 05/15/2018. Social History   Socioeconomic History  . Marital status: Married    Spouse name: Not on  file  . Number of children: 1  . Years of education: [employer]  . Highest education level: Some college, no degree  Occupational History  . Not on file  Tobacco Use  . Smoking status: Former Smoker    Packs/day: 0.25    Years: 10.00    Pack years: 2.50    Types: Cigarettes    Quit date: 11/29/1995    Years since quitting: 23.3  . Smokeless  tobacco: Former Systems developer  . Tobacco comment: Quite 1997, didnt smoke hardly at all when I was smoking.  Substance and Sexual Activity  . Alcohol use: No  . Drug use: No  . Sexual activity: Yes    Birth control/protection: Condom, Post-menopausal, Surgical    Comment: Total Hysterectomy condoms  Other Topics Concern  . 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 Difficulty of Paying Living Expenses: Not very hard  Food Insecurity: No Food Insecurity  . Worried About Charity fundraiser in the Last Year: Never true  . Ran Out of Food in the Last Year: Never true  Transportation Needs: No Transportation Needs  . Lack of Transportation (Medical): No  . Lack of Transportation (Non-Medical): No  Physical Activity: Insufficiently Active  . Days of Exercise per Week: 5 days  . Minutes of Exercise per Session: 20 min  Stress:   . Feeling of Stress : Not on file  Social Connections: Unknown  . Frequency of Communication with Friends and Family: Not on file  . Frequency of Social Gatherings with Friends and Family: Not on file  . Attends Religious Services: Never  . Active Member of Clubs or Organizations: No  . Attends Archivist Meetings: Never  . Marital Status: Widowed    Allergies:  Allergies  Allergen Reactions  . Flagyl [Metronidazole] Anaphylaxis  . Cymbalta [Duloxetine Hcl]   . Tape Rash    Metabolic Disorder Labs: Lab Results  Component Value  Date   HGBA1C 5.2 08/11/2018   MPG 103 08/11/2018   MPG 94 07/30/2017   No results found for: PROLACTIN Lab Results  Component Value Date   CHOL 138 08/11/2018   TRIG 69 08/11/2018   HDL 79 08/11/2018   CHOLHDL 1.7 08/11/2018   LDLCALC 44 08/11/2018   LDLCALC 38 03/10/2018   Lab Results  Component Value Date   TSH 0.86 08/11/2018   TSH 1.88 07/30/2017    Therapeutic Level Labs: No results found for: LITHIUM No results found for: VALPROATE No components found for:  CBMZ  Current Medications: Current Outpatient Medications  Medication Sig Dispense Refill  . albuterol (VENTOLIN HFA) 108 (90 Base) MCG/ACT inhaler Inhale 2 puffs into the lungs every 4 (four) hours as needed for wheezing or shortness of breath (cough). 8 g 2  . ARIPiprazole (ABILIFY) 20 MG tablet Take 1 tablet (20 mg total) by mouth daily. 90 tablet 0  . aspirin 81 MG EC tablet TAKE 1 TABLET BY MOUTH EVERY DAY 90 tablet 3  . Biotin 10000 MCG TABS Take 10,000 mcg by mouth 1 day or 1 dose.    . brompheniramine-pseudoephedrine-DM 30-2-10 MG/5ML syrup Take 5 mLs by mouth 3 (three) times daily as needed. 118 mL 0  . butalbital-acetaminophen-caffeine (FIORICET) 50-325-40 MG tablet Take 1 tablet by mouth 2 (two) times daily as needed for headache.    . CONTRAVE 8-90 MG TB12 Take 2 tablets by mouth 2 (two) times daily. 120 tablet 1  . estradiol (ESTRACE) 1 MG tablet TAKE 1 TABLET BY MOUTH EVERY DAY 90 tablet 1  . etodolac (LODINE) 500 MG tablet Take 1 tablet (500 mg total) by mouth 2 (two) times daily. 180 tablet 1  . ezetimibe (ZETIA) 10 MG tablet Take 1 tablet (10 mg total) by mouth daily. 90 tablet  1  . ferrous sulfate 325 (65 FE) MG tablet ferrous sulfate 325 mg (65 mg iron) tablet  TAKE 1 TABLET BY MOUTH DAILY    . FERROUS SULFATE-FOLIC ACID PO Take 1 tablet by mouth 2 (two) times daily.     . fluticasone (FLONASE) 50 MCG/ACT nasal spray SPRAY 2 SPRAYS INTO EACH NOSTRIL EVERY DAY 48 mL 3  . furosemide (LASIX) 20 MG  tablet TAKE 1 TABLET (20 MG TOTAL) BY MOUTH DAILY AS NEEDED FOR FLUID OR EDEMA. 90 tablet 2  . gabapentin (NEURONTIN) 300 MG capsule Take 1 capsule (300 mg total) by mouth 3 (three) times daily. 270 capsule 3  . Magnesium 500 MG CAPS Once a day    . Melatonin 10 MG CAPS Take 2 capsules by mouth at bedtime.     . Multiple Vitamin (MULTIVITAMIN WITH MINERALS) TABS tablet Take 1 tablet by mouth daily.    Marland Kitchen omeprazole (PRILOSEC) 40 MG capsule TAKE 1 CAPSULE BY MOUTH EVERY DAY 90 capsule 3  . ondansetron (ZOFRAN ODT) 4 MG disintegrating tablet Take 1 tablet (4 mg total) by mouth every 8 (eight) hours as needed for nausea or vomiting. 30 tablet 2  . oxyCODONE (OXY IR/ROXICODONE) 5 MG immediate release tablet TAKE 1 TABLET BY MOUTH EVERY 8 HOURS FILL ON DNF PRIOR TO 04/29/2018    . predniSONE (DELTASONE) 50 MG tablet Take 1 tablet (50 mg total) by mouth daily with breakfast. 5 tablet 0  . propranolol (INDERAL) 10 MG tablet Take 1 tablet (10 mg total) by mouth 3 (three) times daily as needed. Severe anxiety attacks (Patient taking differently: Take 20 mg by mouth 3 (three) times daily as needed. Severe anxiety attacks pt taking 20 mg now) 270 tablet 1  . rosuvastatin (CRESTOR) 40 MG tablet TAKE 1 TABLET BY MOUTH EVERY DAY 90 tablet 0  . tiZANidine (ZANAFLEX) 4 MG tablet Take 1 tablet (4 mg total) by mouth 3 (three) times daily. (Patient taking differently: Take 4 mg by mouth 4 (four) times daily. ) 90 tablet 1  . traZODone (DESYREL) 150 MG tablet TAKE 1 TABLET (150 MG TOTAL) BY MOUTH AT BEDTIME. 90 tablet 1  . venlafaxine XR (EFFEXOR XR) 75 MG 24 hr capsule Take 1 capsule (75 mg total) by mouth daily with breakfast. 90 capsule 0  . vitamin E 100 UNIT capsule Take 100 mg by mouth daily.    Marland Kitchen XIIDRA 5 % SOLN INSTILL 1 DROP INTO BOTH EYES TWICE A DAY     No current facility-administered medications for this visit.     Musculoskeletal: Strength & Muscle Tone: UTA Gait & Station: normal Patient leans:  N/A  Psychiatric Specialty Exam: Review of Systems  Psychiatric/Behavioral: Negative for agitation, behavioral problems, confusion, decreased concentration, dysphoric mood, hallucinations, self-injury, sleep disturbance and suicidal ideas. The patient is not nervous/anxious and is not hyperactive.   All other systems reviewed and are negative.   There were no vitals taken for this visit.There is no height or weight on file to calculate BMI.  General Appearance: Casual  Eye Contact:  Fair  Speech:  Clear and Coherent  Volume:  Normal  Mood:  Euthymic  Affect:  Congruent  Thought Process:  Goal Directed and Descriptions of Associations: Intact  Orientation:  Full (Time, Place, and Person)  Thought Content: Logical   Suicidal Thoughts:  No  Homicidal Thoughts:  No  Memory:  Immediate;   Fair Recent;   Fair Remote;   Fair  Judgement:  Fair  Insight:  Fair  Psychomotor Activity:  Normal  Concentration:  Concentration: Fair and Attention Span: Fair  Recall:  [employer] of Knowledge: Fair  Language: Fair  Akathisia:  No  Handed:  Right  AIMS (if indicated): UTA  Assets:  Communication Skills Desire for Improvement Housing Social Support  ADL's:  Intact  Cognition: WNL  Sleep:  Fair   Screenings: GAD-7     Office Visit from 11/20/2018 in [facility] Office Visit from 08/18/2018 in [facility] Office Visit from 07/22/2018 in [facility] Office Visit from 03/13/2018 in [facility] Office Visit from 12/12/2017 in [facility]  Total GAD-7 Score  4  9  8  5  6     PHQ2-9     Office Visit from 12/16/2018 in [facility] Office Visit from 11/20/2018 in [facility] Office Visit from 09/09/2018 in [facility] Office Visit from 08/18/2018 in [facility] Office Visit from 07/22/2018 in [facility]  PHQ-2 Total Score  0  2  0  2  0  PHQ-9  Total Score  --  6  3  11  2        Assessment and Plan: Keyuna is a 64 year old Caucasian female on disability, engaged, lives in [place], has a history of PTSD, bipolar disorder, panic attacks, migraine headaches, chronic pain was evaluated by telemedicine today.  Patient is biologically predisposed given her family history as well as her own medical problems.  Patient with psychosocial stressors of multiple health problems, current pandemic however is currently making progress.  Plan as noted below.  Plan Bipolar disorder type II in remission Abilify 20 mg p.o. daily Venlafaxine extended release 75 mg p.o. daily with breakfast  PTSD-improving Venlafaxine as prescribed Trazodone 150 mg p.o. nightly Melatonin 10 mg p.o. daily  Panic attacks-improving Propranolol 20 mg p.o. 3 times daily as needed for anxiety attacks  Patient is currently stable.  Discussed with patient about tapering off Abilify however she wants to wait we will continue to reassess during future visits.  Follow-up in clinic in 2 months or sooner if needed.  I have spent atleast 20 minutes non face to face with patient today. More than 50 % of the time was spent for  ordering medications and test ,psychoeducation and supportive psychotherapy and care coordination,as well as documenting clinical information in electronic health record. This note was generated in part or whole with voice recognition software. Voice recognition is usually quite accurate but there are transcription errors that can and very often do occur. I apologize for any typographical errors that were not detected and corrected.      Ursula Alert, MD 03/16/2019, 10:18 AM

## 2019-03-17 ENCOUNTER — Ambulatory Visit (INDEPENDENT_AMBULATORY_CARE_PROVIDER_SITE_OTHER): Payer: 59 | Admitting: Family Medicine

## 2019-03-17 ENCOUNTER — Encounter: Payer: Self-pay | Admitting: Family Medicine

## 2019-03-17 VITALS — BP 111/67 | HR 72 | Temp 97.5°F | Resp 16 | Ht 62.0 in | Wt 192.0 lb

## 2019-03-17 DIAGNOSIS — K909 Intestinal malabsorption, unspecified: Secondary | ICD-10-CM

## 2019-03-17 DIAGNOSIS — Z79899 Other long term (current) drug therapy: Secondary | ICD-10-CM | POA: Diagnosis not present

## 2019-03-17 DIAGNOSIS — F4310 Post-traumatic stress disorder, unspecified: Secondary | ICD-10-CM

## 2019-03-17 DIAGNOSIS — E559 Vitamin D deficiency, unspecified: Secondary | ICD-10-CM

## 2019-03-17 DIAGNOSIS — E669 Obesity, unspecified: Secondary | ICD-10-CM

## 2019-03-17 DIAGNOSIS — G43701 Chronic migraine without aura, not intractable, with status migrainosus: Secondary | ICD-10-CM

## 2019-03-17 DIAGNOSIS — R69 Illness, unspecified: Secondary | ICD-10-CM | POA: Diagnosis not present

## 2019-03-17 DIAGNOSIS — Z9884 Bariatric surgery status: Secondary | ICD-10-CM

## 2019-03-17 DIAGNOSIS — D508 Other iron deficiency anemias: Secondary | ICD-10-CM

## 2019-03-17 DIAGNOSIS — E782 Mixed hyperlipidemia: Secondary | ICD-10-CM | POA: Diagnosis not present

## 2019-03-17 DIAGNOSIS — F3176 Bipolar disorder, in full remission, most recent episode depressed: Secondary | ICD-10-CM

## 2019-03-17 DIAGNOSIS — E538 Deficiency of other specified B group vitamins: Secondary | ICD-10-CM | POA: Diagnosis not present

## 2019-03-17 DIAGNOSIS — Z0000 Encounter for general adult medical examination without abnormal findings: Secondary | ICD-10-CM

## 2019-03-17 DIAGNOSIS — R7309 Other abnormal glucose: Secondary | ICD-10-CM

## 2019-03-17 NOTE — Patient Instructions (Addendum)
Thank you for coming to the office today.  Labs today, stay tuned for result.   [email or web address]  Check on COVID vaccine  [place] COVID19 Vaccine Information  LOCATION:  [facility]) [place] [address]  Hours: Monday - Sunday 8:00am to 12:00pm  COVID-19 Vaccines By Appointment Only  Sign up for [place] List  [email or web address] or call [phone]   Please schedule a Follow-up Appointment to: Return in about 3 months (around 06/17/2019) for 3 month weight check, contrave.  If you have any other questions or concerns, please feel free to call the office or send a message through [facility]. You may also schedule an earlier appointment if necessary.  Additionally, you may be receiving a survey about your experience at our office within a few days to 1 week by e-mail or mail. We value your feedback.  Nobie Putnam, DO [place]

## 2019-03-17 NOTE — Progress Notes (Signed)
Subjective:    Patient ID: Ashley Fields, female    DOB: 01-11-1955, 64 y.o.   MRN: [id number]  Ashley Fields is a 64 y.o. female presenting on 03/17/2019 for Annual Exam   HPI   Here for Annual Physical and Lab Review  Obesity BMI >35 Since last visit has been taking Contrave, finished 1st month and now into 2nd month down about 9- 10 lbs approx so far, with some reduced appetite. Now she is doing a light breakfast with high protein [place] and lunch and dinner are limited portions, lean cuisine, and drinking nearly 100% water frequently. No longer drinking tea. Focus is on reduced portions. - Goal to improve exercise - in addition she has tried weight management programs in past with [employer], Weight Watchers, Skinny Youth, Nutrisystem, Atkins Diet She has no history of Pre-Diabetes or Diabetes. Last A1c 5.2. due for labs.  Hyperlipidemia Chronic problem. Complication of obesity as well. On Rosuvastatin and Zetia currently, needs refill. Followed by Cardiology  FOLLOW-UPBipolar Disorder in full remission / PTSD / Anxiety see prior note for background. Last visit 03/16/19 with Dr Ferdie Ping Psychiatry - She is doing well currently, no new concerns, continues on current meds Abilify 20mg  daily, and continued on Trazodone, Venlafaxine. Propranolol PRN dose inc for anxiety PRN. - Goal is to taper down on Abilify in future. See PHQ below  Chronic Pain Tizanidine 4mg  x 4 times daily PRN pain now for back pain.  Health Maintenance: UTD Colon CA Screening - cologuard negative 08/05/17 - 07/2020  UTD Mammogram 08/2018 UTD TDap 2019 UTD Flu vaccine 08/2018  Depression screen [facility] 2/9 03/17/2019 12/16/2018 11/20/2018  Decreased Interest 0 0 1  Down, Depressed, Hopeless 0 0 1  PHQ - 2 Score 0 0 2  Altered sleeping 1 - 0  Tired, decreased energy 1 - 1  Change in appetite 0 - 2  Feeling bad or failure about yourself  0 - 0  Trouble concentrating 0 - 0  Moving slowly or fidgety/restless 0 -  1  Suicidal thoughts 0 - 0  PHQ-9 Score 2 - 6  Difficult doing work/chores Somewhat difficult - Somewhat difficult  Some recent data might be hidden   GAD 7 : Generalized Anxiety Score 03/17/2019 11/20/2018 08/18/2018 07/22/2018  Nervous, Anxious, on Edge 1 1 1 2   Control/stop worrying 0 1 1 1   Worry too much - different things 0 1 2 1   Trouble relaxing 0 1 1 2   Restless 0 0 1 2  Easily annoyed or irritable 1 0 2 0  Afraid - awful might happen 0 0 1 0  Total GAD 7 Score 2 4 9 8   Anxiety Difficulty Somewhat difficult Somewhat difficult Somewhat difficult Not difficult at all    Past Medical History:  Diagnosis Date  . Allergy   . Anemia   . Anxiety   . Arthritis    Yes  . Blood transfusion without reported diagnosis 1977   Transfusions from miscarriage & hemorrhaging  . Chronic kidney disease   . Depression   . Disp fx of cuboid bone of right foot, init for clos fx 01/01/2017  . GERD (gastroesophageal reflux disease)   . Headache   . Hepatitis C   . Hyperlipidemia   . Neuromuscular disorder ([place]) 1997   Falling to much was an eye-opener  . Osteoporosis   . Thyroid disease   . Urine incontinence    Past Surgical History:  Procedure Laterality Date  . 5 miscarriages  [id number], Blood transfusion s/p miscarriage 1977  . ABDOMINAL HYSTERECTOMY  1987   Total  . APPENDECTOMY  12/2008  . AUGMENTATION MAMMAPLASTY Bilateral 2015   Bilat  . AUGMENTATION MAMMAPLASTY Bilateral 2018   implants redone w/ placement of implants under muscle  . breast lift bilateral, implants  0000000   bilateral, silicon naturel  . BREAST REDUCTION SURGERY Bilateral 1997  . CESAREAN SECTION  07/27/1981   Placenta Previa  . CHOLECYSTECTOMY  03/2004   Lap surgery  . COSMETIC SURGERY     Breast implants w/ lift, tummy tuck, upper & lower Blepharop  . EYE SURGERY  2017   Cataract surgery & lasik  . GASTRIC BYPASS  2005   Laparoscopic  . IVC FILTER INSERTION  12/2003   TrapEase Vena Cava  Filter  . LUMBAR LAMINECTOMY  06/2006   L4-L5 (spinal fusion)  . mini tummy tuck  05/07/2016   Bilateral bra/back roll lift skin removal  . neck surgery C3-C7  02/10/2015   ACDF  . REDUCTION MAMMAPLASTY Bilateral 1997  . SHOULDER ACROMIOPLASTY Left 03/27/2013   w/ labral debridement  . SMALL INTESTINE SURGERY  12/24/2003   Lap Gastric Bypass  . SPINAL CORD STIMULATOR IMPLANT  11/2010   removed 05/2011  . SPINE SURGERY  2008 2017   Fusion L4-L5, ACDF C4-C7   Social History   Socioeconomic History  . Marital status: Married    Spouse name: Not on file  . Number of children: 1  . Years of education: [employer]  . Highest education level: Some college, no degree  Occupational History  . Not on file  Tobacco Use  . Smoking status: Former Smoker    Packs/day: 0.25    Years: 10.00    Pack years: 2.50    Types: Cigarettes    Quit date: 11/29/1995    Years since quitting: 23.3  . Smokeless tobacco: Former Systems developer  . Tobacco comment: Quite 1997, didnt smoke hardly at all when I was smoking.  Substance and Sexual Activity  . Alcohol use: No  . Drug use: No  . Sexual activity: Yes    Birth control/protection: Condom, Post-menopausal, Surgical    Comment: Total Hysterectomy condoms  Other Topics Concern  . 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 Difficulty of Paying Living Expenses: Not very hard  Food Insecurity: No Food Insecurity  . Worried About Charity fundraiser in the Last Year: Never true  . Ran Out of Food in the Last Year: Never true  Transportation Needs: No Transportation Needs  . Lack of Transportation (Medical): No  . Lack of Transportation (Non-Medical): No  Physical Activity: Insufficiently Active  . Days of Exercise per Week: 5 days  . Minutes of Exercise per Session: 20 min    Stress:   . Feeling of Stress : Not on file  Social Connections: Unknown  . Frequency of Communication with Friends and Family: Not on file  . Frequency of Social Gatherings with Friends and Family: Not on file  . Attends Religious Services: Never  . Active Member of Clubs or Organizations: No  . Attends Archivist Meetings: Never  . Marital Status: Widowed  Intimate Partner Violence: Not At Risk  . Fear of Current or Ex-Partner: No  . Emotionally Abused: No  . Physically  Abused: No  . Sexually Abused: No   Family History  Problem Relation Age of Onset  . COPD Mother   . Lung cancer Mother   . Diabetes Mother   . Heart disease Mother   . Stroke Mother   . Heart attack Mother   . Arthritis Mother   . Cancer Mother   . Hypertension Mother   . Obesity Mother   . Varicose Veins Mother   . Heart disease Father   . Heart attack Father 12  . Early death Father   . Depression Sister        x 5 sisters  . COPD Sister   . Hypertension Sister   . Heart disease Brother        x 3 brothers  . Diabetes Brother   . Colon polyps Sister   . Hearing loss Sister   . Heart attack Sister   . Heart attack Brother   . Arthritis Sister   . Varicose Veins Sister   . Arthritis Brother   . Heart disease Brother   . Hypertension Brother   . Cancer Maternal Grandfather   . Stroke Maternal Grandfather   . COPD Sister   . Obesity Sister   . Depression Sister   . Depression Sister   . Heart disease Sister   . Hypertension Sister   . Diabetes Brother   . Heart disease Brother   . Hypertension Brother   . Obesity Brother   . Varicose Veins Brother   . Heart disease Brother   . Hypertension Brother   . Obesity Brother   . Hypertension Sister   . Obesity Sister   . Miscarriages / Stillbirths Maternal Aunt   . Miscarriages / Stillbirths Paternal Aunt   . Breast cancer Neg Hx    Current Outpatient Medications on File Prior to Visit  Medication Sig  . albuterol (VENTOLIN  HFA) 108 (90 Base) MCG/ACT inhaler Inhale 2 puffs into the lungs every 4 (four) hours as needed for wheezing or shortness of breath (cough).  . ARIPiprazole (ABILIFY) 20 MG tablet Take 1 tablet (20 mg total) by mouth daily.  Marland Kitchen aspirin 81 MG EC tablet TAKE 1 TABLET BY MOUTH EVERY DAY  . Biotin 10000 MCG TABS Take 10,000 mcg by mouth 1 day or 1 dose.  . brompheniramine-pseudoephedrine-DM 30-2-10 MG/5ML syrup Take 5 mLs by mouth 3 (three) times daily as needed.  . butalbital-acetaminophen-caffeine (FIORICET) 50-325-40 MG tablet Take 1 tablet by mouth 2 (two) times daily as needed for headache.  . CONTRAVE 8-90 MG TB12 Take 2 tablets by mouth 2 (two) times daily.  Marland Kitchen estradiol (ESTRACE) 1 MG tablet TAKE 1 TABLET BY MOUTH EVERY DAY  . etodolac (LODINE) 500 MG tablet Take 1 tablet (500 mg total) by mouth 2 (two) times daily.  Marland Kitchen ezetimibe (ZETIA) 10 MG tablet Take 1 tablet (10 mg total) by mouth daily.  . ferrous sulfate 325 (65 FE) MG tablet ferrous sulfate 325 mg (65 mg iron) tablet  TAKE 1 TABLET BY MOUTH DAILY  . FERROUS SULFATE-FOLIC ACID PO Take 1 tablet by mouth 2 (two) times daily.   . fluticasone (FLONASE) 50 MCG/ACT nasal spray SPRAY 2 SPRAYS INTO EACH NOSTRIL EVERY DAY  . furosemide (LASIX) 20 MG tablet TAKE 1 TABLET (20 MG TOTAL) BY MOUTH DAILY AS NEEDED FOR FLUID OR EDEMA.  Marland Kitchen gabapentin (NEURONTIN) 300 MG capsule Take 1 capsule (300 mg total) by mouth 3 (three) times daily.  . Magnesium 500 MG  CAPS Once a day  . Melatonin 10 MG CAPS Take 2 capsules by mouth at bedtime.   . Multiple Vitamin (MULTIVITAMIN WITH MINERALS) TABS tablet Take 1 tablet by mouth daily.  Marland Kitchen omeprazole (PRILOSEC) 40 MG capsule TAKE 1 CAPSULE BY MOUTH EVERY DAY  . ondansetron (ZOFRAN ODT) 4 MG disintegrating tablet Take 1 tablet (4 mg total) by mouth every 8 (eight) hours as needed for nausea or vomiting.  Marland Kitchen oxyCODONE (OXY IR/ROXICODONE) 5 MG immediate release tablet TAKE 1 TABLET BY MOUTH EVERY 8 HOURS FILL ON DNF PRIOR  TO 04/29/2018  . propranolol (INDERAL) 10 MG tablet Take 1 tablet (10 mg total) by mouth 3 (three) times daily as needed. Severe anxiety attacks (Patient taking differently: Take 20 mg by mouth 3 (three) times daily as needed. Severe anxiety attacks pt taking 20 mg now)  . rosuvastatin (CRESTOR) 40 MG tablet TAKE 1 TABLET BY MOUTH EVERY DAY  . tiZANidine (ZANAFLEX) 4 MG tablet Take 1 tablet (4 mg total) by mouth 3 (three) times daily. (Patient taking differently: Take 4 mg by mouth 4 (four) times daily. )  . traZODone (DESYREL) 150 MG tablet TAKE 1 TABLET (150 MG TOTAL) BY MOUTH AT BEDTIME.  Marland Kitchen venlafaxine XR (EFFEXOR XR) 75 MG 24 hr capsule Take 1 capsule (75 mg total) by mouth daily with breakfast.  . vitamin E 100 UNIT capsule Take 100 mg by mouth daily.  Marland Kitchen XIIDRA 5 % SOLN INSTILL 1 DROP INTO BOTH EYES TWICE A DAY   No current facility-administered medications on file prior to visit.    Review of Systems  Constitutional: Negative for activity change, appetite change, chills, diaphoresis, fatigue and fever.  HENT: Negative for congestion and hearing loss.   Eyes: Negative for visual disturbance.  Respiratory: Negative for apnea, cough, chest tightness, shortness of breath and wheezing.   Cardiovascular: Negative for chest pain, palpitations and leg swelling.  Gastrointestinal: Negative for abdominal pain, anal bleeding, blood in stool, constipation, diarrhea, nausea and vomiting.  Endocrine: Negative for cold intolerance.  Genitourinary: Negative for difficulty urinating, dysuria, frequency and hematuria.  Musculoskeletal: Negative for arthralgias, back pain and neck pain.  Skin: Negative for rash.  Allergic/Immunologic: Negative for environmental allergies.  Neurological: Negative for dizziness, weakness, light-headedness, numbness and headaches.  Hematological: Negative for adenopathy.  Psychiatric/Behavioral: Negative for behavioral problems, decreased concentration, dysphoric mood and  sleep disturbance. The patient is not nervous/anxious.    Per HPI unless specifically indicated above     Objective:    BP 111/67   Pulse 72   Temp (!) 97.5 F (36.4 C) (Temporal)   Resp 16   Ht 5\' 2"  (1.575 m)   Wt 192 lb (87.1 kg)   BMI 35.12 kg/m   Wt Readings from Last 3 Encounters:  03/17/19 192 lb (87.1 kg)  02/06/19 198 lb 9.6 oz (90.1 kg)  01/23/19 198 lb (89.8 kg)    Physical Exam Vitals and nursing note reviewed.  Constitutional:      General: She is not in acute distress.    Appearance: She is well-developed. She is not diaphoretic.     Comments: Well-appearing, comfortable, cooperative  HENT:     Head: Normocephalic and atraumatic.  Eyes:     General:        Right eye: No discharge.        Left eye: No discharge.     Conjunctiva/sclera: Conjunctivae normal.     Pupils: Pupils are equal, round, and reactive to light.  Neck:     Thyroid: No thyromegaly.  Cardiovascular:     Rate and Rhythm: Normal rate and regular rhythm.     Heart sounds: Normal heart sounds. No murmur.  Pulmonary:     Effort: Pulmonary effort is normal. No respiratory distress.     Breath sounds: Normal breath sounds. No wheezing or rales.  Abdominal:     General: Bowel sounds are normal. There is no distension.     Palpations: Abdomen is soft. There is no mass.     Tenderness: There is no abdominal tenderness.  Musculoskeletal:        General: No tenderness. Normal range of motion.     Cervical back: Normal range of motion and neck supple.     Comments: Upper / Lower Extremities: - Normal muscle tone, strength bilateral upper extremities 5/5, lower extremities 5/5  Lymphadenopathy:     Cervical: No cervical adenopathy.  Skin:    General: Skin is warm and dry.     Findings: No erythema or rash.  Neurological:     Mental Status: She is alert and oriented to person, place, and time.     Comments: Distal sensation intact to light touch all extremities  Psychiatric:         Behavior: Behavior normal.     Comments: Well groomed, good eye contact, normal speech and thoughts       Results for orders placed or performed in visit on 01/23/19  Iron and TIBC  Result Value Ref Range   Iron 60 28 - 170 ug/dL   TIBC 421 250 - 450 ug/dL   Saturation Ratios 14 10.4 - 31.8 %   UIBC 361 ug/dL  Ferritin  Result Value Ref Range   Ferritin 22 11 - 307 ng/mL  CBC with Differential  Result Value Ref Range   WBC 5.7 4.0 - 10.5 K/uL   RBC 4.63 3.87 - 5.11 MIL/uL   Hemoglobin 13.8 12.0 - 15.0 g/dL   HCT 42.8 36.0 - 46.0 %   MCV 92.4 80.0 - 100.0 fL   MCH 29.8 26.0 - 34.0 pg   MCHC 32.2 30.0 - 36.0 g/dL   RDW 12.1 11.5 - 15.5 %   Platelets 149 (L) 150 - 400 K/uL   nRBC 0.0 0.0 - 0.2 %   Neutrophils Relative % 67 %   Neutro Abs 3.8 1.7 - 7.7 K/uL   Lymphocytes Relative 23 %   Lymphs Abs 1.3 0.7 - 4.0 K/uL   Monocytes Relative 6 %   Monocytes Absolute 0.3 0.1 - 1.0 K/uL   Eosinophils Relative 3 %   Eosinophils Absolute 0.2 0.0 - 0.5 K/uL   Basophils Relative 1 %   Basophils Absolute 0.1 0.0 - 0.1 K/uL   Immature Granulocytes 0 %   Abs Immature Granulocytes 0.01 0.00 - 0.07 K/uL      Assessment & Plan:   Problem List Items Addressed This Visit    S/P gastric bypass   Relevant Orders   Vitamin B12   VITAMIN D 25 Hydroxy (Vit-D Deficiency, Fractures)   PTSD (post-traumatic stress disorder)   Relevant Orders   COMPLETE METABOLIC PANEL WITH GFR   Obesity (BMI 30-39.9)   Relevant Orders   VITAMIN D 25 Hydroxy (Vit-D Deficiency, Fractures)   Mixed hyperlipidemia   Relevant Orders   Lipid panel   Chronic migraine without aura with status migrainosus, not intractable   Relevant Orders   COMPLETE METABOLIC PANEL WITH GFR   Bipolar disorder, in  full remission, most recent episode depressed ([place])   Relevant Orders   COMPLETE METABOLIC PANEL WITH GFR   Acquired iron deficiency anemia due to decreased absorption   Abnormal intestinal absorption   Relevant  Orders   Vitamin B12   VITAMIN D 25 Hydroxy (Vit-D Deficiency, Fractures)    Other Visit Diagnoses    Annual physical exam    -  Primary   Relevant Orders   Hemoglobin A1c   COMPLETE METABOLIC PANEL WITH GFR   Lipid panel   Abnormal glucose       Relevant Orders   Hemoglobin A1c   Vitamin D deficiency       Relevant Orders   VITAMIN D 25 Hydroxy (Vit-D Deficiency, Fractures)   Vitamin B12 deficiency       Relevant Orders   Vitamin B12   Long-term use of high-risk medication       Relevant Orders   Hemoglobin A1c   COMPLETE METABOLIC PANEL WITH GFR   Lipid panel   Vitamin B12   VITAMIN D 25 Hydroxy (Vit-D Deficiency, Fractures)      Updated Health Maintenance information Due for fasting labs, will go get blood drawn here after visit Send results to mychart with information and f/u  Prior low Vitamin B12/D and s/p gastric bypass with malabsorption, will re-check  Improved iron deficiency anemia and followed by [facility] will f/u in March 2021, defer lab CBC and anemia panel has improved it seems.  Improving Bipolar in remission and PTSD, per Psychiatry, current meds still good but in future she will work with Psych to decrease Abilify dosing.  Weight Management Continue Contrave current dose for month #2 and finish next refill for month #3, has demonstrated appropriate weight loss approximately 7 lbs in 6 weeks. Likely remain off med after finish 3 months for a trial off then can repeat in future again if need. She is using goodrx discount. Encouraged improvement to lifestyle with diet and exercise - Goal of weight loss   No orders of the defined types were placed in this encounter.    Follow up plan: Return in about 3 months (around 06/17/2019) for 3 month weight check, contrave.  Nobie Putnam, [facility] 03/17/2019, 8:43 AM

## 2019-03-18 NOTE — Telephone Encounter (Signed)
Can you check to see if [facility] can add on a CBC + Anemia panel to her lab draw recently? [id number] (Iron, Ferritin, Saturation), and CBC. Thanks - if not she should return for re-draw and we can order. Let me know.

## 2019-03-19 DIAGNOSIS — M5441 Lumbago with sciatica, right side: Secondary | ICD-10-CM

## 2019-03-19 DIAGNOSIS — G8929 Other chronic pain: Secondary | ICD-10-CM

## 2019-03-19 DIAGNOSIS — G894 Chronic pain syndrome: Secondary | ICD-10-CM

## 2019-03-19 DIAGNOSIS — M5442 Lumbago with sciatica, left side: Secondary | ICD-10-CM

## 2019-03-19 MED ORDER — ESTRADIOL 1 MG PO TABS: 1.0000 mg | ORAL_TABLET | Freq: Every day | ORAL | 1 refills | Status: DC

## 2019-03-20 LAB — TEST AUTHORIZATION

## 2019-03-20 LAB — IRON,TIBC AND FERRITIN PANEL
%SAT: 18 % (calc) (ref 16–45)
Ferritin: 23 ng/mL (ref 16–288)
Iron: 71 ug/dL (ref 45–160)
TIBC: 401 mcg/dL (calc) (ref 250–450)

## 2019-03-20 LAB — COMPLETE METABOLIC PANEL WITH GFR
AG Ratio: 1.7 (calc) (ref 1.0–2.5)
ALT: 20 U/L (ref 6–29)
AST: 22 U/L (ref 10–35)
Albumin: 4.3 g/dL (ref 3.6–5.1)
Alkaline phosphatase (APISO): 53 U/L (ref 37–153)
BUN/Creatinine Ratio: 13 (calc) (ref 6–22)
BUN: 13 mg/dL (ref 7–25)
CO2: 33 mmol/L — ABNORMAL HIGH (ref 20–32)
Calcium: 9.2 mg/dL (ref 8.6–10.4)
Chloride: 106 mmol/L (ref 98–110)
Creat: 1.03 mg/dL — ABNORMAL HIGH (ref 0.50–0.99)
GFR, Est African American: 67 mL/min/{1.73_m2} (ref >=60)
GFR, Est Non African American: 58 mL/min/{1.73_m2} — ABNORMAL LOW (ref >=60)
Globulin: 2.5 g/dL (calc) (ref 1.9–3.7)
Glucose, Bld: 95 mg/dL (ref 65–99)
Potassium: 4.6 mmol/L (ref 3.5–5.3)
Sodium: 143 mmol/L (ref 135–146)
Total Bilirubin: 0.6 mg/dL (ref 0.2–1.2)
Total Protein: 6.8 g/dL (ref 6.1–8.1)

## 2019-03-20 LAB — HEMOGLOBIN A1C
Hgb A1c MFr Bld: 5 % of total Hgb (ref <5.7)
Mean Plasma Glucose: 97 (calc)
eAG (mmol/L): 5.4 (calc)

## 2019-03-20 LAB — VITAMIN B12: Vitamin B-12: 486 pg/mL (ref 200–1100)

## 2019-03-20 LAB — LIPID PANEL
Cholesterol: 142 mg/dL (ref <200)
HDL: 71 mg/dL (ref >=50)
LDL Cholesterol (Calc): 53 mg/dL (calc)
Non-HDL Cholesterol (Calc): 71 mg/dL (calc) (ref <130)
Total CHOL/HDL Ratio: 2 (calc) (ref <5.0)
Triglycerides: 101 mg/dL (ref <150)

## 2019-03-20 LAB — VITAMIN D 25 HYDROXY (VIT D DEFICIENCY, FRACTURES): Vit D, 25-Hydroxy: 21 ng/mL — ABNORMAL LOW (ref 30–100)

## 2019-04-13 ENCOUNTER — Ambulatory Visit: Payer: Medicare HMO

## 2019-04-29 DIAGNOSIS — M4316 Spondylolisthesis, lumbar region: Secondary | ICD-10-CM | POA: Diagnosis not present

## 2019-05-18 ENCOUNTER — Ambulatory Visit
Admission: RE | Admit: 2019-05-18 | Discharge: 2019-05-18 | Disposition: A | Discharge status: Home / Self Care | Payer: 59 | Source: Ambulatory Visit | Attending: Family Medicine | Admitting: Family Medicine

## 2019-05-18 ENCOUNTER — Ambulatory Visit: Payer: Self-pay

## 2019-05-18 ENCOUNTER — Telehealth (INDEPENDENT_AMBULATORY_CARE_PROVIDER_SITE_OTHER): Payer: 59 | Admitting: Psychiatry

## 2019-05-18 ENCOUNTER — Ambulatory Visit (INDEPENDENT_AMBULATORY_CARE_PROVIDER_SITE_OTHER): Payer: 59 | Admitting: Family Medicine

## 2019-05-18 ENCOUNTER — Encounter: Payer: Self-pay | Admitting: Psychiatry

## 2019-05-18 ENCOUNTER — Other Ambulatory Visit: Payer: Self-pay

## 2019-05-18 ENCOUNTER — Encounter: Payer: Self-pay | Admitting: Family Medicine

## 2019-05-18 VITALS — BP 130/70 | HR 78 | Temp 97.1°F | Ht 62.0 in | Wt 198.0 lb

## 2019-05-18 DIAGNOSIS — F4310 Post-traumatic stress disorder, unspecified: Secondary | ICD-10-CM

## 2019-05-18 DIAGNOSIS — M79642 Pain in left hand: Secondary | ICD-10-CM | POA: Insufficient documentation

## 2019-05-18 DIAGNOSIS — T148XXA Other injury of unspecified body region, initial encounter: Secondary | ICD-10-CM | POA: Insufficient documentation

## 2019-05-18 DIAGNOSIS — F3176 Bipolar disorder, in full remission, most recent episode depressed: Secondary | ICD-10-CM | POA: Diagnosis not present

## 2019-05-18 DIAGNOSIS — R69 Illness, unspecified: Secondary | ICD-10-CM | POA: Diagnosis not present

## 2019-05-18 DIAGNOSIS — F410 Panic disorder [episodic paroxysmal anxiety] without agoraphobia: Secondary | ICD-10-CM | POA: Diagnosis not present

## 2019-05-18 MED ORDER — ARIPIPRAZOLE 20 MG PO TABS: 10.0000 mg | ORAL_TABLET | Freq: Every day | ORAL | 0 refills | Status: DC

## 2019-05-18 NOTE — Assessment & Plan Note (Signed)
Left hand pain s/p fall into hand today.  Loss of active ROM, xray taken and questionable fracture at base of first metacarpal/scaphoid area.  Discussed with patient and images printed.  Requested patient proceed to [facility] in [place], as their urgent care is open until 7:30pm and can be splinted and treatment plan discussed.  Patient in agreement with plan.  Xray images provided.  Plan: 1. Xray images provided and patient to proceed to [facility] for further evaluation/splinting.

## 2019-05-18 NOTE — Patient Instructions (Signed)
As we discussed, you have a possible left scaphoid fracture.  I have had your images printed and want you to follow up with [facility] through their walk in urgent care today/tomorrow for evaluation and splinting.  Can take over the counter ibuprofen and/or acetaminophen, according to packaging directions, as needed for discomfort/swelling  We will plan to see you back as needed for this  You will receive a survey after today's visit either digitally by e-mail or paper by [place] mail. Your experiences and feedback matter to [place].  Please respond so we know how we are doing as we provide care for you.  Call us with any questions/concerns/needs.  It is my goal to be available to you for your health concerns.  Thanks for choosing me to be a partner in your healthcare needs!  Harlin Rain, FNP-C Family Nurse Practitioner [facility] Phone: [phone]

## 2019-05-18 NOTE — Telephone Encounter (Signed)
Pt. Called and reported she was pulled down 3 cement steps, and landed on cement sidewalk at approx. 1:00 PM today.  Stated her Pit Bull pulled her down the steps, causing her to fall.  Reported swelling and bruising of left thumb area, and pain just below the thumb joint.   Stated the pain is mod. To severe.  Denied hitting head or feeling faint/ dizzy.  Reported multiple areas with scratches and bruises.  Stated she has bruises on right arm, bruising of right LE, from knee to just above the ankle, and scratches of the hands and arms.  Reported  bleeding from the left elbow, initially, that has subsided.  She applied "New Skin" to the left elbow.  Advised to schedule an appt. this afternoon with Nurse Practitioner.  Appt. sched. at 3:40 PM today.  Agreed with plan.     Reason for Disposition . Nursing judgment or information in reference  Answer Assessment - Initial Assessment Questions 1. REASON FOR CALL: "What is your main concern right now?"     Pulled down 3 cement steps by her dog  2. ONSET: "When did the accident occur?"     1:00 PM  3. SEVERITY: "How bad is the pain?"     Mod to severe 4. FEVER: "Do you have a fever?"     Denied 5. OTHER SYMPTOMS: "Do you have any other new symptoms?"     Pain below left thumb joint; bruising of the right LE from knee to ankle, multiple scratches and bruises of hands / arms; initial bleeding left elbow that has subsided from using "new skin"  6. INTERVENTIONS AND RESPONSE: "What have you done so far to try to make this better? What medications have you used?"     Has not taken any pain medication at this time.  7. PREGNANCY: "Is there any chance you are pregnant?"     N/a  Protocols used: NO GUIDELINE AVAILABLE-A-AH

## 2019-05-18 NOTE — Progress Notes (Signed)
Provider Location : [facility] Patient Location : Home  Virtual Visit via Video Note  I connected with Ashley Fields on 05/18/19 at 10:00 AM EDT by a video enabled telemedicine application and verified that I am speaking with the correct person using two identifiers.   I discussed the limitations of evaluation and management by telemedicine and the availability of in person appointments. The patient expressed understanding and agreed to proceed.    I discussed the assessment and treatment plan with the patient. The patient was provided an opportunity to ask questions and all were answered. The patient agreed with the plan and demonstrated an understanding of the instructions.   The patient was advised to call back or seek an in-person evaluation if the symptoms worsen or if the condition fails to improve as anticipated.   Owings MD OP Progress Note  05/18/2019 12:59 PM Ashley Fields  MRN:  [id number]  Chief Complaint:  Chief Complaint    Follow-up     HPI: Stachia is a 64 year old Caucasian female, on disability, lives in [place], married, has a history of bipolar disorder, PTSD, chronic pain, migraine headaches, hypoglycemic episodes was evaluated by telemedicine today.  Patient today reports she continues to have stable mood.  She denies any mood swings.  She reports sleep is good.  She wonders whether she can take a lower dose of trazodone since she has been able to sleep better.  She is compliant on medications and denies side effects.  Patient denies any suicidality, homicidality or perceptual disturbances.  Patient reports she is ready to gradually being tapered off of the Abilify.  Patient denies any other concerns today. Visit Diagnosis:    ICD-10-CM   1. Bipolar disorder, in full remission, most recent episode depressed (HCC)  F31.76 ARIPiprazole (ABILIFY) 20 MG tablet  2. PTSD (post-traumatic stress disorder)  F43.10   3. Panic attacks  F41.0     Past Psychiatric History: I have  reviewed past psychiatric history from my progress note on 05/15/2018.  Past trials of Prozac, Wellbutrin, Lexapro, Cymbalta, Rexulti.  Past Medical History:  Past Medical History:  Diagnosis Date  . Allergy   . Anemia   . Anxiety   . Arthritis    Yes  . Blood transfusion without reported diagnosis 1977   Transfusions from miscarriage & hemorrhaging  . Chronic kidney disease   . Depression   . Disp fx of cuboid bone of right foot, init for clos fx 01/01/2017  . GERD (gastroesophageal reflux disease)   . Headache   . Hepatitis C   . Hyperlipidemia   . Neuromuscular disorder ([place]) 1997   Falling to much was an eye-opener  . Osteoporosis   . Thyroid disease   . Urine incontinence     Past Surgical History:  Procedure Laterality Date  . 5 miscarriages     1977-1985, Blood transfusion s/p miscarriage 1977  . ABDOMINAL HYSTERECTOMY  1987   Total  . APPENDECTOMY  12/2008  . AUGMENTATION MAMMAPLASTY Bilateral 2015   Bilat  . AUGMENTATION MAMMAPLASTY Bilateral 2018   implants redone w/ placement of implants under muscle  . breast lift bilateral, implants  0000000   bilateral, silicon naturel  . BREAST REDUCTION SURGERY Bilateral 1997  . CESAREAN SECTION  07/27/1981   Placenta Previa  . CHOLECYSTECTOMY  03/2004   Lap surgery  . COSMETIC SURGERY     Breast implants w/ lift, tummy tuck, upper & lower Blepharop  . EYE SURGERY  2017   Cataract surgery &  lasik  . GASTRIC BYPASS  2005   Laparoscopic  . IVC FILTER INSERTION  12/2003   TrapEase Vena Cava Filter  . LUMBAR LAMINECTOMY  06/2006   L4-L5 (spinal fusion)  . mini tummy tuck  05/07/2016   Bilateral bra/back roll lift skin removal  . neck surgery C3-C7  02/10/2015   ACDF  . REDUCTION MAMMAPLASTY Bilateral 1997  . SHOULDER ACROMIOPLASTY Left 03/27/2013   w/ labral debridement  . SMALL INTESTINE SURGERY  12/24/2003   Lap Gastric Bypass  . SPINAL CORD STIMULATOR IMPLANT  11/2010   removed 05/2011  . SPINE SURGERY   2008 2017   Fusion L4-L5, ACDF C4-C7    Family Psychiatric History: I have reviewed family psychiatric history from my progress note on 05/15/2018. Family History:  Family History  Problem Relation Age of Onset  . COPD Mother   . Lung cancer Mother   . Diabetes Mother   . Heart disease Mother   . Stroke Mother   . Heart attack Mother   . Arthritis Mother   . Cancer Mother   . Hypertension Mother   . Obesity Mother   . Varicose Veins Mother   . Heart disease Father   . Heart attack Father 1  . Early death Father   . Depression Sister        x 5 sisters  . COPD Sister   . Hypertension Sister   . Heart disease Brother        x 3 brothers  . Diabetes Brother   . Colon polyps Sister   . Hearing loss Sister   . Heart attack Sister   . Heart attack Brother   . Arthritis Sister   . Varicose Veins Sister   . Arthritis Brother   . Heart disease Brother   . Hypertension Brother   . Cancer Maternal Grandfather   . Stroke Maternal Grandfather   . COPD Sister   . Obesity Sister   . Depression Sister   . Depression Sister   . Heart disease Sister   . Hypertension Sister   . Diabetes Brother   . Heart disease Brother   . Hypertension Brother   . Obesity Brother   . Varicose Veins Brother   . Heart disease Brother   . Hypertension Brother   . Obesity Brother   . Hypertension Sister   . Obesity Sister   . Miscarriages / Stillbirths Maternal Aunt   . Miscarriages / Stillbirths Paternal Aunt   . Breast cancer Neg Hx     Social History:  Social History   Socioeconomic History  . Marital status: Married    Spouse name: Not on file  . Number of children: 1  . Years of education: [employer]  . Highest education level: Some college, no degree  Occupational History  . Not on file  Tobacco Use  . Smoking status: Former Smoker    Packs/day: 0.25    Years: 10.00    Pack years: 2.50    Types: Cigarettes    Quit date: 11/29/1995    Years since quitting: 23.4  .  Smokeless tobacco: Former Systems developer  . Tobacco comment: Quite 1997, didnt smoke hardly at all when I was smoking.  Substance and Sexual Activity  . Alcohol use: No  . Drug use: No  . Sexual activity: Yes    Birth control/protection: Condom, Post-menopausal, Surgical    Comment: Total Hysterectomy condoms  Other Topics Concern  . 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 Difficulty of Paying Living Expenses: Not very hard  Food Insecurity:   . Worried About Charity fundraiser in the Last Year:   . Arboriculturist in the Last Year:   Transportation Needs:   . Film/video editor (Medical):   Marland Kitchen Lack of Transportation (Non-Medical):   Physical Activity:   . Days of Exercise per Week:   . Minutes of Exercise per Session:   Stress:   . Feeling of Stress :   Social Connections:   . Frequency of Communication with Friends and Family:   . Frequency of Social Gatherings with Friends and Family:   . Attends Religious Services:   . Active Member of Clubs or Organizations:   . Attends Archivist Meetings:   Marland Kitchen Marital Status:     Allergies:  Allergies  Allergen Reactions  . Flagyl [Metronidazole] Anaphylaxis  . Cymbalta [Duloxetine Hcl]   . Tape Rash    Metabolic Disorder Labs: Lab Results  Component Value Date   HGBA1C 5.0 03/17/2019   MPG 97 03/17/2019   MPG 103 08/11/2018   No results found for: PROLACTIN Lab Results  Component Value Date   CHOL 142 03/17/2019   TRIG 101 03/17/2019   HDL 71 03/17/2019   CHOLHDL 2.0 03/17/2019   LDLCALC 53 03/17/2019   LDLCALC 44 08/11/2018   Lab Results  Component Value Date   TSH 0.86 08/11/2018   TSH 1.88 07/30/2017    Therapeutic Level Labs: No results found for: LITHIUM No results found for: VALPROATE No components found for:   CBMZ  Current Medications: Current Outpatient Medications  Medication Sig Dispense Refill  . albuterol (VENTOLIN HFA) 108 (90 Base) MCG/ACT inhaler Inhale 2 puffs into the lungs every 4 (four) hours as needed for wheezing or shortness of breath (cough). 8 g 2  . ARIPiprazole (ABILIFY) 20 MG tablet Take 0.5 tablets (10 mg total) by mouth daily. 90 tablet 0  . aspirin 81 MG EC tablet TAKE 1 TABLET BY MOUTH EVERY DAY 90 tablet 3  . Biotin 10000 MCG TABS Take 10,000 mcg by mouth 1 day or 1 dose.    . brompheniramine-pseudoephedrine-DM 30-2-10 MG/5ML syrup Take 5 mLs by mouth 3 (three) times daily as needed. 118 mL 0  . butalbital-acetaminophen-caffeine (FIORICET) 50-325-40 MG tablet Take 1 tablet by mouth 2 (two) times daily as needed for headache.    . CONTRAVE 8-90 MG TB12 Take 2 tablets by mouth 2 (two) times daily. 120 tablet 1  . estradiol (ESTRACE) 1 MG tablet Take 1 tablet (1 mg total) by mouth daily. 90 tablet 1  . etodolac (LODINE) 500 MG tablet Take 1 tablet (500 mg total) by mouth 2 (two) times daily. 180 tablet 1  . ezetimibe (ZETIA) 10 MG tablet Take 1 tablet (10 mg total) by mouth daily. 90 tablet 1  . ferrous sulfate 325 (65 FE) MG tablet ferrous sulfate 325 mg (65 mg iron) tablet  TAKE 1 TABLET BY MOUTH DAILY    . FERROUS SULFATE-FOLIC ACID PO Take 1 tablet by mouth 2 (two) times daily.     . fluticasone (FLONASE) 50 MCG/ACT nasal spray SPRAY 2 SPRAYS INTO EACH NOSTRIL EVERY DAY 48 mL 3  . furosemide (LASIX) 20 MG tablet TAKE 1 TABLET (20 MG TOTAL)  BY MOUTH DAILY AS NEEDED FOR FLUID OR EDEMA. 90 tablet 2  . gabapentin (NEURONTIN) 300 MG capsule Take 1 capsule (300 mg total) by mouth 3 (three) times daily. 270 capsule 3  . Magnesium 500 MG CAPS Once a day    . Melatonin 10 MG CAPS Take 2 capsules by mouth at bedtime.     . Multiple Vitamin (MULTIVITAMIN WITH MINERALS) TABS tablet Take 1 tablet by mouth daily.    Marland Kitchen omeprazole (PRILOSEC) 40 MG capsule TAKE 1 CAPSULE BY MOUTH EVERY  DAY 90 capsule 3  . ondansetron (ZOFRAN ODT) 4 MG disintegrating tablet Take 1 tablet (4 mg total) by mouth every 8 (eight) hours as needed for nausea or vomiting. 30 tablet 2  . oxyCODONE (OXY IR/ROXICODONE) 5 MG immediate release tablet TAKE 1 TABLET BY MOUTH EVERY 8 HOURS FILL ON DNF PRIOR TO 04/29/2018    . propranolol (INDERAL) 10 MG tablet Take 1 tablet (10 mg total) by mouth 3 (three) times daily as needed. Severe anxiety attacks (Patient taking differently: Take 20 mg by mouth 3 (three) times daily as needed. Severe anxiety attacks pt taking 20 mg now) 270 tablet 1  . rosuvastatin (CRESTOR) 40 MG tablet TAKE 1 TABLET BY MOUTH EVERY DAY 90 tablet 0  . tiZANidine (ZANAFLEX) 4 MG tablet Take 1 tablet (4 mg total) by mouth 3 (three) times daily. (Patient taking differently: Take 4 mg by mouth 4 (four) times daily. ) 90 tablet 1  . traZODone (DESYREL) 150 MG tablet TAKE 1 TABLET (150 MG TOTAL) BY MOUTH AT BEDTIME. 90 tablet 1  . venlafaxine XR (EFFEXOR XR) 75 MG 24 hr capsule Take 1 capsule (75 mg total) by mouth daily with breakfast. 90 capsule 0  . vitamin E 100 UNIT capsule Take 100 mg by mouth daily.    Marland Kitchen XIIDRA 5 % SOLN INSTILL 1 DROP INTO BOTH EYES TWICE A DAY     No current facility-administered medications for this visit.     Musculoskeletal: Strength & Muscle Tone: UTA Gait & Station: normal Patient leans: N/A  Psychiatric Specialty Exam: Review of Systems  Psychiatric/Behavioral: Negative for agitation, behavioral problems, confusion, decreased concentration, dysphoric mood, hallucinations, self-injury, sleep disturbance and suicidal ideas. The patient is not nervous/anxious and is not hyperactive.   All other systems reviewed and are negative.   There were no vitals taken for this visit.There is no height or weight on file to calculate BMI.  General Appearance: Casual  Eye Contact:  Fair  Speech:  Normal Rate  Volume:  Normal  Mood:  Euthymic  Affect:  Congruent  Thought  Process:  Goal Directed and Descriptions of Associations: Intact  Orientation:  Full (Time, Place, and Person)  Thought Content: Logical   Suicidal Thoughts:  No  Homicidal Thoughts:  No  Memory:  Immediate;   Fair Recent;   Fair Remote;   Fair  Judgement:  Fair  Insight:  Fair  Psychomotor Activity:  Normal  Concentration:  Concentration: Fair and Attention Span: Fair  Recall:  [employer] of Knowledge: Fair  Language: Fair  Akathisia:  No  Handed:  Right  AIMS (if indicated): UTA  Assets:  Communication Skills Desire for Improvement Housing Social Support  ADL's:  Intact  Cognition: WNL  Sleep:  Fair   Screenings: GAD-7     [facility] from [facility]/17/2019 [facility] Office Visit from 11/20/2018 in [facility] Office Visit from 08/18/2018 in [facility]  Office Visit from 07/22/2018 in [facility] Office Visit from 03/13/2018 in [facility]  Total GAD-7 Score  2  4  9  8  5     PHQ2-9     Office Visit from 03/17/2019 in [facility] Office Visit from 12/16/2018 in [facility] Office Visit from 11/20/2018 in [facility] Office Visit from 09/09/2018 in [facility] Office Visit from 08/18/2018 in [facility]  PHQ-2 Total Score  0  0  2  0  2  PHQ-9 Total Score  2  --  6  3  11        Assessment and Plan: Tinzley is a 64 year old Caucasian female on disability, married, lives in [place], has a history of PTSD, bipolar disorder, panic attacks, migraine headaches, chronic pain was evaluated by telemedicine today.  Patient is biologically predisposed given her family history as well as her own medical problems.  Patient with psychosocial stressors of multiple health problems, current pandemic is currently stable on medications.  Will gradually taper her off of the Abilify.  Plan as noted below.  Bipolar disorder in remission Reduce Abilify to  10 mg p.o. daily Venlafaxine extended release 75 mg p.o. daily with breakfast  PTSD-stable Venlafaxine as prescribed Trazodone, reduce to 75 mg p.o. nightly. Melatonin 10 mg p.o. daily  Panic attacks-stable Propranolol 20 mg p.o. 3 times daily as needed for anxiety attacks  Follow-up in clinic in 1.5 months or sooner if needed.  I have spent atleast 20 minutes non face to face with patient today. More than 50 % of the time was spent for preparing to see the patient ( e.g., review of test, records ), ordering medications and test ,psychoeducation and supportive psychotherapy and care coordination,as well as documenting clinical information in electronic health record. This note was generated in part or whole with voice recognition software. Voice recognition is usually quite accurate but there are transcription errors that can and very often do occur. I apologize for any typographical errors that were not detected and corrected.       Ursula Alert, MD 05/18/2019, 12:59 PM

## 2019-05-18 NOTE — Assessment & Plan Note (Signed)
Abrasions noted to bilateral knees and left elbow s/p fall onto concrete.  Patient had cleaned and washed wounds out prior to arrival, treated topically with neosporin and new skin liquid bandage.  No s/s of infection or retained foreign body.  Ecchymotic area right calf, no tenderness with palpation, discussed likely self limiting and should begin to resolve over the next few days.  Plan: 1. Follow up as needed

## 2019-05-18 NOTE — Progress Notes (Signed)
Subjective:    Patient ID: Ashley Fields, female    DOB: 1956/01/07, 64 y.o.   MRN: [id number]  Ashley Fields is a 64 y.o. female presenting on 05/18/2019 for Hand Pain (left hand pain, left elbow injury, bilateral knee injury and right calf bruising. She got pulled down some cement step and on cement sidewalk by her dog trying to get at another animal.)   HPI  Ashley Fields presents to clinic for evaluation of left hand pain, left elbow skin scrape, bilateral knee scrapes and right calf bruising.  States she was walking down a cement step with her dog and had fallen onto the concrete.  Reports has cleaned out the scrapes and applied topical neosporin and new skin liquid bandage.  Denies any pain with her abrasions or right calf bruising.  Reports all pain in left hand with loss of ROM at base of 1st metacarpal.  Full finger ROM, loss of some ROM in left hand thumb and pain with passive full ROM.  Denies previous hand injury/trauma/surgery.  Depression screen [facility] 2/9 03/17/2019 12/16/2018 11/20/2018  Decreased Interest 0 0 1  Down, Depressed, Hopeless 0 0 1  PHQ - 2 Score 0 0 2  Altered sleeping 1 - 0  Tired, decreased energy 1 - 1  Change in appetite 0 - 2  Feeling bad or failure about yourself  0 - 0  Trouble concentrating 0 - 0  Moving slowly or fidgety/restless 0 - 1  Suicidal thoughts 0 - 0  PHQ-9 Score 2 - 6  Difficult doing work/chores Somewhat difficult - Somewhat difficult  Some recent data might be hidden    Social History   Tobacco Use  . Smoking status: Former Smoker    Packs/day: 0.25    Years: 10.00    Pack years: 2.50    Types: Cigarettes    Quit date: 11/29/1995    Years since quitting: 23.4  . Smokeless tobacco: Former Systems developer  . Tobacco comment: Quite 1997, didnt smoke hardly at all when I was smoking.  Substance Use Topics  . Alcohol use: No  . Drug use: No    Review of Systems  Constitutional: Negative.   HENT: Negative.   Eyes: Negative.   Respiratory:  Negative.   Cardiovascular: Negative.   Gastrointestinal: Negative.   Endocrine: Negative.   Genitourinary: Negative.   Musculoskeletal: Positive for arthralgias and myalgias. Negative for back pain, gait problem, joint swelling, neck pain and neck stiffness.  Skin: Positive for wound. Negative for color change, pallor and rash.       Ecchymosis to right outer calf  Allergic/Immunologic: Negative.   Neurological: Negative.   Hematological: Negative.   Psychiatric/Behavioral: Negative.    Per HPI unless specifically indicated above     Objective:    BP 130/70   Pulse 78   Temp (!) 97.1 F (36.2 C) (Temporal)   Ht 5\' 2"  (1.575 m)   Wt 198 lb (89.8 kg)   BMI 36.21 kg/m   Wt Readings from Last 3 Encounters:  05/18/19 198 lb (89.8 kg)  03/17/19 192 lb (87.1 kg)  02/06/19 198 lb 9.6 oz (90.1 kg)    Physical Exam Constitutional:      General: She is not in acute distress.    Appearance: Normal appearance. She is well-developed and well-groomed. She is obese. She is not ill-appearing or toxic-appearing.  HENT:     Head: Normocephalic.  Eyes:     General: Lids are normal. Vision grossly intact.  Conjunctiva/sclera: Conjunctivae normal.  Cardiovascular:     Pulses: Normal pulses.  Pulmonary:     Effort: Pulmonary effort is normal. No respiratory distress.  Musculoskeletal:        General: Swelling, tenderness and signs of injury present.     Right elbow: Normal.     Left elbow: Swelling present.     Right wrist: Normal.     Left wrist: No swelling, effusion or lacerations. Decreased range of motion.     Right hand: Normal.     Left hand: Swelling and tenderness present. No deformity or lacerations. Decreased range of motion. Decreased strength. Normal sensation. Normal capillary refill. Normal pulse.       Arms:     Right knee: No swelling. Normal range of motion. No tenderness. Normal alignment. Normal pulse.     Left knee: No swelling or ecchymosis. Normal range of  motion. No tenderness. Normal alignment. Normal pulse.     Right lower leg: No edema.     Left lower leg: No edema.       Legs:     Comments: Abrasion to left elbow, superficial, full ROM, no pain with active ROM, no s/s of infection.  Wound was washed prior to arrival.  Left hand/wrist with decreased active ROM but full passive ROM, hand in flexed position, unable to fully extend.  When testing ROM of wrist left to right pt with increased pain but able to move with assistance.  Bilateral knees with abrasions, superficial.  No s/s of infection, full ROM.  Wounds were washed and cleaned prior to arrival.  Right lower calf with large outer ecchymosis, non tender to touch, able to ambulate and full ROM without tenderness/pain  Skin:    General: Skin is warm and dry.     Capillary Refill: Capillary refill takes less than 2 seconds.  Neurological:     General: No focal deficit present.     Mental Status: She is alert and oriented to person, place, and time.     Cranial Nerves: No cranial nerve deficit.     Sensory: No sensory deficit.     Motor: No weakness.     Coordination: Coordination normal.     Gait: Gait normal.  Psychiatric:        Mood and Affect: Mood normal.        Behavior: Behavior normal. Behavior is cooperative.        Thought Content: Thought content normal.        Judgment: Judgment normal.     Results for orders placed or performed in visit on 03/17/19  Hemoglobin A1c  Result Value Ref Range   Hgb A1c MFr Bld 5.0 <5.7 % of total Hgb   Mean Plasma Glucose 97 (calc)   eAG (mmol/L) 5.4 (calc)  COMPLETE METABOLIC PANEL WITH GFR  Result Value Ref Range   Glucose, Bld 95 65 - 99 mg/dL   BUN 13 7 - 25 mg/dL   Creat 1.03 (H) 0.50 - 0.99 mg/dL   GFR, Est Non African American 58 (L) > OR = 60 mL/min/1.77m2   GFR, Est African American 67 > OR = 60 mL/min/1.67m2   BUN/Creatinine Ratio 13 6 - 22 (calc)   Sodium 143 135 - 146 mmol/L   Potassium 4.6 3.5 - 5.3 mmol/L    Chloride 106 98 - 110 mmol/L   CO2 33 (H) 20 - 32 mmol/L   Calcium 9.2 8.6 - 10.4 mg/dL   Total Protein 6.8 6.1 -  8.1 g/dL   Albumin 4.3 3.6 - 5.1 g/dL   Globulin 2.5 1.9 - 3.7 g/dL (calc)   AG Ratio 1.7 1.0 - 2.5 (calc)   Total Bilirubin 0.6 0.2 - 1.2 mg/dL   Alkaline phosphatase (APISO) 53 37 - 153 U/L   AST 22 10 - 35 U/L   ALT 20 6 - 29 U/L  Lipid panel  Result Value Ref Range   Cholesterol 142 <200 mg/dL   HDL 71 > OR = 50 mg/dL   Triglycerides 101 <150 mg/dL   LDL Cholesterol (Calc) 53 mg/dL (calc)   Total CHOL/HDL Ratio 2.0 <5.0 (calc)   Non-HDL Cholesterol (Calc) 71 <130 mg/dL (calc)  Vitamin B12  Result Value Ref Range   Vitamin B-12 486 200 - 1,100 pg/mL  VITAMIN D 25 Hydroxy (Vit-D Deficiency, Fractures)  Result Value Ref Range   Vit D, 25-Hydroxy 21 (L) 30 - 100 ng/mL  Iron, TIBC and Ferritin Panel  Result Value Ref Range   Iron 71 45 - 160 mcg/dL   TIBC 401 250 - 450 mcg/dL (calc)   %SAT 18 16 - 45 % (calc)   Ferritin 23 16 - 288 ng/mL  TEST AUTHORIZATION  Result Value Ref Range   TEST NAME: IRON, TIBC AND FERRITIN PANEL    TEST CODE: [id number]    CLIENT CONTACT: DR Parks Ranger    REPORT ALWAYS MESSAGE SIGNATURE        Assessment & Plan:   Problem List Items Addressed This Visit      Other   Left hand pain - Primary    Left hand pain s/p fall into hand today.  Loss of active ROM, xray taken and questionable fracture at base of first metacarpal/scaphoid area.  Discussed with patient and images printed.  Requested patient proceed to [facility] in [place], as their urgent care is open until 7:30pm and can be splinted and treatment plan discussed.  Patient in agreement with plan.  Xray images provided.  Plan: 1. Xray images provided and patient to proceed to [facility] for further evaluation/splinting.      Relevant Orders   DG Hand Complete Left   Abrasion    Abrasions noted to bilateral knees and left elbow s/p fall onto concrete.  Patient had  cleaned and washed wounds out prior to arrival, treated topically with neosporin and new skin liquid bandage.  No s/s of infection or retained foreign body.  Ecchymotic area right calf, no tenderness with palpation, discussed likely self limiting and should begin to resolve over the next few days.  Plan: 1. Follow up as needed         No orders of the defined types were placed in this encounter.     Follow up plan: Return if symptoms worsen or fail to improve.   Harlin Rain, [place] [facility] Nurse Practitioner [facility] 05/18/2019, 4:44 PM

## 2019-05-19 ENCOUNTER — Ambulatory Visit: Payer: Medicare HMO | Admitting: Family Medicine

## 2019-05-19 DIAGNOSIS — S63502A Unspecified sprain of left wrist, initial encounter: Secondary | ICD-10-CM | POA: Diagnosis not present

## 2019-05-19 DIAGNOSIS — M25532 Pain in left wrist: Secondary | ICD-10-CM | POA: Diagnosis not present

## 2019-05-26 ENCOUNTER — Other Ambulatory Visit: Payer: Self-pay | Admitting: Family Medicine

## 2019-05-26 DIAGNOSIS — E782 Mixed hyperlipidemia: Secondary | ICD-10-CM

## 2019-05-26 DIAGNOSIS — H34211 Partial retinal artery occlusion, right eye: Secondary | ICD-10-CM

## 2019-05-26 NOTE — Telephone Encounter (Signed)
Requested Prescriptions  Pending Prescriptions Disp Refills  . [facility] ASPIRIN LOW DOSE 81 MG EC tablet [Pharmacy Med Name: [facility] ASPIRIN EC 81 MG TABLET] 90 tablet 3    Sig: TAKE 1 TABLET BY MOUTH EVERY DAY     Analgesics:  NSAIDS - aspirin Passed - 05/26/2019  1:24 AM      Passed - Patient is not pregnant      Passed - Valid encounter within last 12 months    Recent Outpatient Visits          1 week ago Left hand pain   [place], FNP   2 months ago Annual physical exam   [facility] Olin Hauser, DO   3 months ago Suspected COVID-19 virus infection   [place], DO   3 months ago Obesity (BMI 30-39.9)   [facility], Devonne Doughty, DO   5 months ago Chronic migraine without aura with status migrainosus, not intractable   [place], DO      Future Appointments            In 3 weeks [place], Devonne Doughty, DO [facility], [facility]

## 2019-05-28 ENCOUNTER — Encounter: Payer: Self-pay | Admitting: Family Medicine

## 2019-05-28 ENCOUNTER — Other Ambulatory Visit: Payer: Self-pay

## 2019-05-28 ENCOUNTER — Ambulatory Visit (INDEPENDENT_AMBULATORY_CARE_PROVIDER_SITE_OTHER): Payer: 59 | Admitting: Family Medicine

## 2019-05-28 VITALS — BP 124/64 | HR 69 | Temp 97.5°F | Resp 16 | Ht 62.0 in | Wt 201.6 lb

## 2019-05-28 DIAGNOSIS — M79604 Pain in right leg: Secondary | ICD-10-CM

## 2019-05-28 DIAGNOSIS — S8011XA Contusion of right lower leg, initial encounter: Secondary | ICD-10-CM | POA: Diagnosis not present

## 2019-05-28 DIAGNOSIS — E669 Obesity, unspecified: Secondary | ICD-10-CM

## 2019-05-28 DIAGNOSIS — T148XXA Other injury of unspecified body region, initial encounter: Secondary | ICD-10-CM

## 2019-05-28 MED ORDER — SAXENDA 18 MG/3ML ~~LOC~~ SOPN: 0.6000 mg | PEN_INJECTOR | Freq: Every day | SUBCUTANEOUS | 0 refills | Status: DC

## 2019-05-28 NOTE — Patient Instructions (Addendum)
Thank you for coming to the office today.  Likely hematoma with bruise Keep elevating, ice and heat alternating, as long as swelling is improving, may take a while for the knot to resolve  Try Saxenda sample. For weight loss  We can consider a rx for it if you like it, but may have difficulty with coverage.  SubQ: Initial: 0.6 mg once daily for 1 week; increase by 0.6 mg daily at weekly intervals to a target dose of 3 mg once daily. If the patient cannot tolerate an increased dose during dose escalation, consider delaying dose escalation for 1 additional week   Please schedule a Follow-up Appointment to: Return if symptoms worsen or fail to improve, for keep apt for 6/10.  If you have any other questions or concerns, please feel free to call the office or send a message through [place]. You may also schedule an earlier appointment if necessary.  Additionally, you may be receiving a survey about your experience at our office within a few days to 1 week by e-mail or mail. We value your feedback.  Nobie Putnam, DO [place]

## 2019-05-28 NOTE — Progress Notes (Signed)
Subjective:    Patient ID: Ashley Fields, female    DOB: 01-21-1955, 64 y.o.   MRN: [id number]  Ashley Fields is a 64 y.o. female presenting on 05/28/2019 for Leg Pain (Bruise --patient fell due to ---dog pulled pt. down cement steps and landed on sidewalk on 05/17/2019)   HPI   Obesity BMI >38 / Abnormal Weight Gain. Chronic problem with abnormal weight gain worse in past >12 months +20 lbs gain. She has history of prior significant morbid obesity and s/p gastric bypass surgery in 2005. She had done well overall but still has had some issues with continued weight gain over the years. Now recent had worsening. She is on variety of other medications for her mood currently and followed by Dr Shea Evans at [facility]. - For lifestyle, she continues to limit portions / calories, reduce starch/fats in diet and has tried to exercise but has had some limitations on exercise - She has tried Contrave in past. Phentermine. in addition she has tried weight management programs in past with [employer], Weight Watchers, Skinny Youth, Nutrisystem, Atkins Diet - No history of Pre-Diabetes or DIabetes  Right Lower extremity pain / contusion / hematoma Reports recent injury back on 5/9 accidental fall with trauma due to dog causing fall, RLE area injured as well as L hand - already seen back on 5/10 had x-ray of L wrist/hand concern for scaphoid injury referred to Ortho seen on 5/11. Thumb spica splint 7-10 days restrictions and f/u. - Now has residual RLE extremity pain with still ecchymosis bruising and knots under skin, painful. She can ambulate without problem. Admits some tingling in R side of  Leg into foot. No otherwise loss of sensation. Denies any redness spreading, ulceration, dyspnea, calf pain or swelling asymmetry  Depression screen [facility] 2/9 03/17/2019 12/16/2018 11/20/2018  Decreased Interest 0 0 1  Down, Depressed, Hopeless 0 0 1  PHQ - 2 Score 0 0 2  Altered sleeping 1 - 0  Tired, decreased energy 1 - 1    Change in appetite 0 - 2  Feeling bad or failure about yourself  0 - 0  Trouble concentrating 0 - 0  Moving slowly or fidgety/restless 0 - 1  Suicidal thoughts 0 - 0  PHQ-9 Score 2 - 6  Difficult doing work/chores Somewhat difficult - Somewhat difficult  Some recent data might be hidden    Social History   Tobacco Use  . Smoking status: Former Smoker    Packs/day: 0.25    Years: 10.00    Pack years: 2.50    Types: Cigarettes    Quit date: 11/29/1995    Years since quitting: 23.5  . Smokeless tobacco: Former Systems developer  . Tobacco comment: Quite 1997, didnt smoke hardly at all when I was smoking.  Substance Use Topics  . Alcohol use: No  . Drug use: No    Review of Systems Per HPI unless specifically indicated above     Objective:    BP 124/64   Pulse 69   Temp (!) 97.5 F (36.4 C) (Temporal)   Resp 16   Ht 5\' 2"  (1.575 m)   Wt 201 lb 9.6 oz (91.4 kg)   SpO2 99%   BMI 36.87 kg/m   Wt Readings from Last 3 Encounters:  05/28/19 201 lb 9.6 oz (91.4 kg)  05/18/19 198 lb (89.8 kg)  03/17/19 192 lb (87.1 kg)    Physical Exam Vitals and nursing note reviewed.  Constitutional:  General: She is not in acute distress.    Appearance: She is well-developed. She is obese. She is not diaphoretic.     Comments: Well-appearing, comfortable, cooperative  HENT:     Head: Normocephalic and atraumatic.  Eyes:     General:        Right eye: No discharge.        Left eye: No discharge.     Conjunctiva/sclera: Conjunctivae normal.  Cardiovascular:     Rate and Rhythm: Normal rate.  Pulmonary:     Effort: Pulmonary effort is normal.  Musculoskeletal:     Right lower leg: No edema.     Left lower leg: No edema.     Comments: Right lower extremity lateral aspect below knee with larger area resolving ecchymosis, has 2 distinct localized more nodular density subcutaneous tender consistent with hematoma formation  Skin:    General: Skin is warm and dry.     Findings: Bruising  (R lower extremity ) present. No erythema or rash.  Neurological:     Mental Status: She is alert and oriented to person, place, and time.     Sensory: No sensory deficit.     Comments: Distal sensation intact  Psychiatric:        Behavior: Behavior normal.     Comments: Well groomed, good eye contact, normal speech and thoughts    Results for orders placed or performed in visit on 03/17/19  Hemoglobin A1c  Result Value Ref Range   Hgb A1c MFr Bld 5.0 <5.7 % of total Hgb   Mean Plasma Glucose 97 (calc)   eAG (mmol/L) 5.4 (calc)  COMPLETE METABOLIC PANEL WITH GFR  Result Value Ref Range   Glucose, Bld 95 65 - 99 mg/dL   BUN 13 7 - 25 mg/dL   Creat 1.03 (H) 0.50 - 0.99 mg/dL   GFR, Est Non African American 58 (L) > OR = 60 mL/min/1.69m2   GFR, Est African American 67 > OR = 60 mL/min/1.72m2   BUN/Creatinine Ratio 13 6 - 22 (calc)   Sodium 143 135 - 146 mmol/L   Potassium 4.6 3.5 - 5.3 mmol/L   Chloride 106 98 - 110 mmol/L   CO2 33 (H) 20 - 32 mmol/L   Calcium 9.2 8.6 - 10.4 mg/dL   Total Protein 6.8 6.1 - 8.1 g/dL   Albumin 4.3 3.6 - 5.1 g/dL   Globulin 2.5 1.9 - 3.7 g/dL (calc)   AG Ratio 1.7 1.0 - 2.5 (calc)   Total Bilirubin 0.6 0.2 - 1.2 mg/dL   Alkaline phosphatase (APISO) 53 37 - 153 U/L   AST 22 10 - 35 U/L   ALT 20 6 - 29 U/L  Lipid panel  Result Value Ref Range   Cholesterol 142 <200 mg/dL   HDL 71 > OR = 50 mg/dL   Triglycerides 101 <150 mg/dL   LDL Cholesterol (Calc) 53 mg/dL (calc)   Total CHOL/HDL Ratio 2.0 <5.0 (calc)   Non-HDL Cholesterol (Calc) 71 <130 mg/dL (calc)  Vitamin B12  Result Value Ref Range   Vitamin B-12 486 200 - 1,100 pg/mL  VITAMIN D 25 Hydroxy (Vit-D Deficiency, Fractures)  Result Value Ref Range   Vit D, 25-Hydroxy 21 (L) 30 - 100 ng/mL  Iron, TIBC and Ferritin Panel  Result Value Ref Range   Iron 71 45 - 160 mcg/dL   TIBC 401 250 - 450 mcg/dL (calc)   %SAT 18 16 - 45 % (calc)   Ferritin 23 16 -  288 ng/mL  TEST AUTHORIZATION    Result Value Ref Range   TEST NAME: IRON, TIBC AND FERRITIN PANEL    TEST CODE: [id number]    CLIENT CONTACT: DR Parks Ranger    REPORT ALWAYS MESSAGE SIGNATURE        Assessment & Plan:   Problem List Items Addressed This Visit    Obesity (BMI 30-39.9)   Relevant Medications   Liraglutide -Weight Management (SAXENDA) 18 MG/3ML SOPN    Other Visit Diagnoses    Right leg pain    -  Primary   Traumatic hematoma of right lower leg, initial encounter       Bruising          #Right Leg Pain / hematoma / contusion Secondary to MSK injury from contusion after accidental fall, triggered by pet dog Likely superficial nerve impacted has some localized paresthesia - improving Gradual improving ecchymosis on RLE Has area of hematoma formation based on exam Reassurance, use hot/cold compresses, elevation if swelling  # Obesity, abnormal weight Abnormal weight gain Prior trial on Contrave mixed results, off now Previous history phentermine Will trial sample Saxenda - review benefit risk dosing, sample pen given 18mg  pen, w/ needles, dose 0.6mg  daily inj inc by 0.6 weekly until desired effect Ancitipate order rx if requested if she tolerates it with good result     Meds ordered this encounter  Medications  . Liraglutide -Weight Management (SAXENDA) 18 MG/3ML SOPN    Sig: Inject 0.1 mLs (0.6 mg total) into the skin daily.    Dispense:  1 pen    Refill:  0      Follow up plan: Return if symptoms worsen or fail to improve, for keep apt for 6/10.    Nobie Putnam, [facility] 05/28/2019, 10:57 AM

## 2019-06-11 DIAGNOSIS — E669 Obesity, unspecified: Secondary | ICD-10-CM

## 2019-06-11 MED ORDER — SAXENDA 18 MG/3ML ~~LOC~~ SOPN: 3.0000 mg | PEN_INJECTOR | Freq: Every day | SUBCUTANEOUS | 2 refills | Status: DC

## 2019-06-11 NOTE — Addendum Note (Signed)
Addended by: Olin Hauser on: 06/11/2019 04:45 PM   Modules accepted: Orders

## 2019-06-12 DIAGNOSIS — M48061 Spinal stenosis, lumbar region without neurogenic claudication: Secondary | ICD-10-CM | POA: Diagnosis not present

## 2019-06-12 DIAGNOSIS — G894 Chronic pain syndrome: Secondary | ICD-10-CM | POA: Diagnosis not present

## 2019-06-12 DIAGNOSIS — M479 Spondylosis, unspecified: Secondary | ICD-10-CM | POA: Diagnosis not present

## 2019-06-12 DIAGNOSIS — Z79899 Other long term (current) drug therapy: Secondary | ICD-10-CM | POA: Diagnosis not present

## 2019-06-12 DIAGNOSIS — M533 Sacrococcygeal disorders, not elsewhere classified: Secondary | ICD-10-CM | POA: Diagnosis not present

## 2019-06-12 DIAGNOSIS — M545 Low back pain: Secondary | ICD-10-CM | POA: Diagnosis not present

## 2019-06-12 DIAGNOSIS — M25552 Pain in left hip: Secondary | ICD-10-CM | POA: Diagnosis not present

## 2019-06-12 MED ORDER — SAXENDA 18 MG/3ML ~~LOC~~ SOPN: 3.0000 mg | PEN_INJECTOR | Freq: Every day | SUBCUTANEOUS | 2 refills | Status: DC

## 2019-06-12 NOTE — Addendum Note (Signed)
Addended by: Olin Hauser on: 06/12/2019 12:20 PM   Modules accepted: Orders

## 2019-06-18 ENCOUNTER — Other Ambulatory Visit: Payer: Self-pay | Admitting: Psychiatry

## 2019-06-18 ENCOUNTER — Encounter: Payer: Self-pay | Admitting: Family Medicine

## 2019-06-18 ENCOUNTER — Ambulatory Visit (INDEPENDENT_AMBULATORY_CARE_PROVIDER_SITE_OTHER): Payer: 59 | Admitting: Family Medicine

## 2019-06-18 ENCOUNTER — Other Ambulatory Visit: Payer: Self-pay

## 2019-06-18 VITALS — BP 102/61 | HR 77 | Temp 97.5°F | Resp 16 | Ht 62.0 in | Wt 196.0 lb

## 2019-06-18 DIAGNOSIS — I25118 Atherosclerotic heart disease of native coronary artery with other forms of angina pectoris: Secondary | ICD-10-CM | POA: Diagnosis not present

## 2019-06-18 DIAGNOSIS — S8011XD Contusion of right lower leg, subsequent encounter: Secondary | ICD-10-CM

## 2019-06-18 DIAGNOSIS — F3176 Bipolar disorder, in full remission, most recent episode depressed: Secondary | ICD-10-CM

## 2019-06-18 DIAGNOSIS — Z8249 Family history of ischemic heart disease and other diseases of the circulatory system: Secondary | ICD-10-CM | POA: Diagnosis not present

## 2019-06-18 DIAGNOSIS — F410 Panic disorder [episodic paroxysmal anxiety] without agoraphobia: Secondary | ICD-10-CM

## 2019-06-18 DIAGNOSIS — E669 Obesity, unspecified: Secondary | ICD-10-CM | POA: Insufficient documentation

## 2019-06-18 DIAGNOSIS — F4310 Post-traumatic stress disorder, unspecified: Secondary | ICD-10-CM

## 2019-06-18 MED ORDER — OZEMPIC (0.25 OR 0.5 MG/DOSE) 2 MG/1.5ML ~~LOC~~ SOPN: 0.2500 mg | PEN_INJECTOR | SUBCUTANEOUS | 2 refills | Status: DC

## 2019-06-18 NOTE — Patient Instructions (Addendum)
Thank you for coming to the office today.  Ozempic (Semaglutide injection) - start 0.25mg  weekly for 4 weeks then increase to 0.5mg  weekly - This one has best benefit of weight loss and reducing Cardiovascular events  Hematoma, improving. May take time, future can do ultrasound if needed.  DUE for FASTING BLOOD WORK (no food or drink after midnight before the lab appointment, only water or coffee without cream/sugar on the morning of)  SCHEDULE "Lab Only" visit in the morning at the clinic for lab draw in 3 MONTHS   - Make sure Lab Only appointment is at about 1 week before your next appointment, so that results will be available  For Lab Results, once available within 2-3 days of blood draw, you can can log in to MyChart online to view your results and a brief explanation. Also, we can discuss results at next follow-up visit.   Please schedule a Follow-up Appointment to: Return in about 3 months (around 09/18/2019) for Follow-up Labs, Weight.  If you have any other questions or concerns, please feel free to call the office or send a message through [place]. You may also schedule an earlier appointment if necessary.  Additionally, you may be receiving a survey about your experience at our office within a few days to 1 week by e-mail or mail. We value your feedback.  Nobie Putnam, DO [place]

## 2019-06-18 NOTE — Progress Notes (Signed)
Subjective:    Patient ID: Ashley Fields, female    DOB: Oct 03, 1955, 64 y.o.   MRN: [id number]  Ashley Fields is a 64 y.o. female presenting on 06/18/2019 for Weight Check and Fall (still has knot under her bruise from fall in past)   HPI   Follow-up RLE injury, hematoma contusion Last visit 05/28/19, original fall 05/17/19 see note RLE improving gradually, less pain and swelling and discoloration, circulation good. Some hematoma still, using ice packs as needed  Morbid Obesity BMI >35 HLD CAD Fam history CAD Chronic problem with abnormal weight gain worse in past >12 months +20 lbs gain. She has history of prior significant morbid obesity and s/p gastric bypass surgery in 2005. She had done well overall but still has had some issues with continued weight gain over the years. Now recent had worsening. She is on variety of other medications for her mood currently and followed by Dr Shea Evans at [facility]. - For lifestyle, she continues to limit portions / calories, reduce starch/fats in diet and has tried to exercise but has had some limitations on exercise - She has tried Contrave in past. Phentermine, and most recently [place] with some wt loss 5 lbs in 1 month. in addition she has tried weight management programs in past with[employer] Youth, Nutrisystem,Atkins Diet - No history of Pre-Diabetes or DIabetes    Depression screen [facility] 2/9 06/18/2019 03/17/2019 12/16/2018  Decreased Interest 0 0 0  Down, Depressed, Hopeless 0 0 0  PHQ - 2 Score 0 0 0  Altered sleeping - 1 -  Tired, decreased energy - 1 -  Change in appetite - 0 -  Feeling bad or failure about yourself  - 0 -  Trouble concentrating - 0 -  Moving slowly or fidgety/restless - 0 -  Suicidal thoughts - 0 -  PHQ-9 Score - 2 -  Difficult doing work/chores - Somewhat difficult -  Some recent data might be hidden    Social History   Tobacco Use  . Smoking status: Former Smoker    Packs/day: 0.25    Years:  10.00    Pack years: 2.50    Types: Cigarettes    Quit date: 11/29/1995    Years since quitting: 23.5  . Smokeless tobacco: Former Systems developer  . Tobacco comment: Quite 1997, didnt smoke hardly at all when I was smoking.  Vaping Use  . Vaping Use: Never used  Substance Use Topics  . Alcohol use: No  . Drug use: No    Review of Systems Per HPI unless specifically indicated above     Objective:    BP 102/61   Pulse 77   Temp (!) 97.5 F (36.4 C) (Temporal)   Resp 16   Ht 5\' 2"  (1.575 m)   Wt 196 lb (88.9 kg)   SpO2 98%   BMI 35.85 kg/m   Wt Readings from Last 3 Encounters:  06/18/19 196 lb (88.9 kg)  05/28/19 201 lb 9.6 oz (91.4 kg)  05/18/19 198 lb (89.8 kg)    Physical Exam Vitals and nursing note reviewed.  Constitutional:      General: She is not in acute distress.    Appearance: She is well-developed. She is obese. She is not diaphoretic.     Comments: Well-appearing, comfortable, cooperative  HENT:     Head: Normocephalic and atraumatic.  Eyes:     General:        Right eye: No discharge.  Left eye: No discharge.     Conjunctiva/sclera: Conjunctivae normal.  Cardiovascular:     Rate and Rhythm: Normal rate.  Pulmonary:     Effort: Pulmonary effort is normal.  Musculoskeletal:     Comments: RLE with resolving traumatic injury with hematoma, now with smaller reduced nodular density lower anterior leg, possible area of hematoma still gradual resolving, some scar tissue likely with mild tender. No significant edema or asymmetry or calf tenderness.  Skin:    General: Skin is warm and dry.     Findings: No erythema or rash.  Neurological:     Mental Status: She is alert and oriented to person, place, and time.  Psychiatric:        Behavior: Behavior normal.     Comments: Well groomed, good eye contact, normal speech and thoughts       Results for orders placed or performed in visit on 03/17/19  Hemoglobin A1c  Result Value Ref Range   Hgb A1c MFr Bld  5.0 <5.7 % of total Hgb   Mean Plasma Glucose 97 (calc)   eAG (mmol/L) 5.4 (calc)  COMPLETE METABOLIC PANEL WITH GFR  Result Value Ref Range   Glucose, Bld 95 65 - 99 mg/dL   BUN 13 7 - 25 mg/dL   Creat 1.03 (H) 0.50 - 0.99 mg/dL   GFR, Est Non African American 58 (L) > OR = 60 mL/min/1.28m2   GFR, Est African American 67 > OR = 60 mL/min/1.33m2   BUN/Creatinine Ratio 13 6 - 22 (calc)   Sodium 143 135 - 146 mmol/L   Potassium 4.6 3.5 - 5.3 mmol/L   Chloride 106 98 - 110 mmol/L   CO2 33 (H) 20 - 32 mmol/L   Calcium 9.2 8.6 - 10.4 mg/dL   Total Protein 6.8 6.1 - 8.1 g/dL   Albumin 4.3 3.6 - 5.1 g/dL   Globulin 2.5 1.9 - 3.7 g/dL (calc)   AG Ratio 1.7 1.0 - 2.5 (calc)   Total Bilirubin 0.6 0.2 - 1.2 mg/dL   Alkaline phosphatase (APISO) 53 37 - 153 U/L   AST 22 10 - 35 U/L   ALT 20 6 - 29 U/L  Lipid panel  Result Value Ref Range   Cholesterol 142 <200 mg/dL   HDL 71 > OR = 50 mg/dL   Triglycerides 101 <150 mg/dL   LDL Cholesterol (Calc) 53 mg/dL (calc)   Total CHOL/HDL Ratio 2.0 <5.0 (calc)   Non-HDL Cholesterol (Calc) 71 <130 mg/dL (calc)  Vitamin B12  Result Value Ref Range   Vitamin B-12 486 200 - 1,100 pg/mL  VITAMIN D 25 Hydroxy (Vit-D Deficiency, Fractures)  Result Value Ref Range   Vit D, 25-Hydroxy 21 (L) 30 - 100 ng/mL  Iron, TIBC and Ferritin Panel  Result Value Ref Range   Iron 71 45 - 160 mcg/dL   TIBC 401 250 - 450 mcg/dL (calc)   %SAT 18 16 - 45 % (calc)   Ferritin 23 16 - 288 ng/mL  TEST AUTHORIZATION  Result Value Ref Range   TEST NAME: IRON, TIBC AND FERRITIN PANEL    TEST CODE: [id number]    CLIENT CONTACT: DR Parks Ranger    REPORT ALWAYS MESSAGE SIGNATURE        Assessment & Plan:   Problem List Items Addressed This Visit    Morbid obesity ([place])   Relevant Medications   OZEMPIC, 0.25 OR 0.5 MG/DOSE, 2 MG/1.5ML SOPN   Atherosclerosis of native coronary artery of native heart  with stable angina pectoris ([place]) - Primary   Relevant Medications    OZEMPIC, 0.25 OR 0.5 MG/DOSE, 2 MG/1.5ML SOPN    Other Visit Diagnoses    Family history of heart disease       Relevant Medications   OZEMPIC, 0.25 OR 0.5 MG/DOSE, 2 MG/1.5ML SOPN   Hematoma of right lower extremity, subsequent encounter          Morbid Obesity BMI >35 with comorbidity CAD, HLD A1c is 5.0, no evidence of PreDM or DM Some gradual weight loss with lifestyle changes, recent Saxenda sample dose titration is limited due to quantity of medication up to 1.2mg  daily inj, now down 5 lbs in 1 month  Failed Contrave  Order Ozempic rx titration 0.25mg  weekly inj x 4 weeks then inc to 0.5 mg weekly, will attempt to complete PA due to her history of CAD, HLD and cardiovascular risk w/ morbid obesity   #Hematoma, RLE Improving prior traumatic injury Residual tenderness over hematoma and likely some small nodular scar tissue areas May consider [place] order if indicated if unresolved  Meds ordered this encounter  Medications  . OZEMPIC, 0.25 OR 0.5 MG/DOSE, 2 MG/1.5ML SOPN    Sig: Inject 0.1875 mLs (0.25 mg total) into the skin once a week. For first 4 weeks. Then increase dose to 0.5mg  weekly    Dispense:  1 pen    Refill:  2     Follow up plan: Return in about 3 months (around 09/18/2019) for Follow-up Labs, Weight.  Future labs will order AFTER visit in AM in 3 months, will review available lab results and then place orders. No iron labs will be needed, already done by Hematologist  Nobie Putnam, [facility] 06/18/2019, 8:39 AM

## 2019-06-20 ENCOUNTER — Other Ambulatory Visit: Payer: Self-pay | Admitting: Cardiovascular Disease

## 2019-06-26 DIAGNOSIS — M5481 Occipital neuralgia: Secondary | ICD-10-CM

## 2019-06-26 DIAGNOSIS — M47812 Spondylosis without myelopathy or radiculopathy, cervical region: Secondary | ICD-10-CM

## 2019-06-29 ENCOUNTER — Ambulatory Visit
Admission: RE | Admit: 2019-06-29 | Discharge: 2019-06-29 | Disposition: A | Discharge status: Home / Self Care | Payer: 59 | Source: Ambulatory Visit | Attending: Family Medicine | Admitting: Family Medicine

## 2019-06-29 ENCOUNTER — Ambulatory Visit
Admission: RE | Admit: 2019-06-29 | Discharge: 2019-06-29 | Disposition: A | Discharge status: Home / Self Care | Payer: 59 | Attending: Family Medicine | Admitting: Family Medicine

## 2019-06-29 ENCOUNTER — Other Ambulatory Visit: Payer: Self-pay

## 2019-06-29 DIAGNOSIS — M542 Cervicalgia: Secondary | ICD-10-CM | POA: Diagnosis not present

## 2019-06-29 DIAGNOSIS — M47812 Spondylosis without myelopathy or radiculopathy, cervical region: Secondary | ICD-10-CM | POA: Insufficient documentation

## 2019-06-29 DIAGNOSIS — M5481 Occipital neuralgia: Secondary | ICD-10-CM | POA: Diagnosis not present

## 2019-07-02 ENCOUNTER — Other Ambulatory Visit: Payer: Self-pay

## 2019-07-02 ENCOUNTER — Telehealth (INDEPENDENT_AMBULATORY_CARE_PROVIDER_SITE_OTHER): Payer: 59 | Admitting: Psychiatry

## 2019-07-02 ENCOUNTER — Encounter: Payer: Self-pay | Admitting: Psychiatry

## 2019-07-02 DIAGNOSIS — F3176 Bipolar disorder, in full remission, most recent episode depressed: Secondary | ICD-10-CM

## 2019-07-02 DIAGNOSIS — F4310 Post-traumatic stress disorder, unspecified: Secondary | ICD-10-CM | POA: Diagnosis not present

## 2019-07-02 DIAGNOSIS — F410 Panic disorder [episodic paroxysmal anxiety] without agoraphobia: Secondary | ICD-10-CM | POA: Diagnosis not present

## 2019-07-02 MED ORDER — VENLAFAXINE HCL ER 75 MG PO CP24: 75.0000 mg | ORAL_CAPSULE | Freq: Every day | ORAL | 2 refills | Status: DC

## 2019-07-02 MED ORDER — ARIPIPRAZOLE 10 MG PO TABS: 5.0000 mg | ORAL_TABLET | Freq: Every day | ORAL | 2 refills | Status: DC

## 2019-07-02 NOTE — Progress Notes (Signed)
Provider Location : [facility] Patient Location : Home  Virtual Visit via Video Note  I connected with Ashley Fields on 07/02/19 at 10:00 AM EDT by a video enabled telemedicine application and verified that I am speaking with the correct person using two identifiers.   I discussed the limitations of evaluation and management by telemedicine and the availability of in person appointments. The patient expressed understanding and agreed to proceed.    I discussed the assessment and treatment plan with the patient. The patient was provided an opportunity to ask questions and all were answered. The patient agreed with the plan and demonstrated an understanding of the instructions.   The patient was advised to call back or seek an in-person evaluation if the symptoms worsen or if the condition fails to improve as anticipated.  [place] MD OP Progress Note  07/02/2019 10:12 AM Ashley Fields  MRN:  [id number]  Chief Complaint:  Chief Complaint    Follow-up     HPI: Ashley Fields is a 64 year old Caucasian female on disability, lives in [place], married, has a history of bipolar disorder, PTSD, chronic pain, migraine headaches, hypoglycemic episodes, was evaluated by telemedicine today.  Patient today reports she is currently tolerating the lower dosage of Abilify.  She currently takes 10 mg which is a reduction from her 20 mg previously.  She denies any significant mood lability.  She reports anxiety symptoms are controlled.  She reports sleep continues to be good.  She goes to bed early and is awake by 4:30 a.m.  She reports she has to get lunch and breakfast ready and also has to take care of her animals.  She however reports she feels as though she is getting enough sleep since she goes to bed early.  Patient denies any suicidality, homicidality or perceptual disturbances.  Patient denies any other concerns today.  Visit Diagnosis:    ICD-10-CM   1. Bipolar disorder, in full remission, most recent episode  depressed (HCC)  F31.76 ARIPiprazole (ABILIFY) 10 MG tablet    venlafaxine XR (EFFEXOR-XR) 75 MG 24 hr capsule  2. PTSD (post-traumatic stress disorder)  F43.10 venlafaxine XR (EFFEXOR-XR) 75 MG 24 hr capsule  3. Panic attacks  F41.0 venlafaxine XR (EFFEXOR-XR) 75 MG 24 hr capsule    Past Psychiatric History: I have reviewed past psychiatric history from my progress note on 05/15/2018.  Past trials of Prozac, Wellbutrin, Lexapro, Cymbalta, Rexulti  Past Medical History:  Past Medical History:  Diagnosis Date  . Allergy   . Anemia   . Anxiety   . Arthritis    Yes  . Blood transfusion without reported diagnosis 1977   Transfusions from miscarriage & hemorrhaging  . Chronic kidney disease   . Depression   . Disp fx of cuboid bone of right foot, init for clos fx 01/01/2017  . GERD (gastroesophageal reflux disease)   . Headache   . Hepatitis C   . Hyperlipidemia   . Neuromuscular disorder ([place]) 1997   Falling to much was an eye-opener  . Osteoporosis   . Thyroid disease   . Urine incontinence     Past Surgical History:  Procedure Laterality Date  . 5 miscarriages     1977-1985, Blood transfusion s/p miscarriage 1977  . ABDOMINAL HYSTERECTOMY  1987   Total  . APPENDECTOMY  12/2008  . AUGMENTATION MAMMAPLASTY Bilateral 2015   Bilat  . AUGMENTATION MAMMAPLASTY Bilateral 2018   implants redone w/ placement of implants under muscle  . breast lift bilateral, implants  27/78/2423   bilateral, silicon naturel  . BREAST REDUCTION SURGERY Bilateral 1997  . CESAREAN SECTION  07/27/1981   Placenta Previa  . CHOLECYSTECTOMY  03/2004   Lap surgery  . COSMETIC SURGERY     Breast implants w/ lift, tummy tuck, upper & lower Blepharop  . EYE SURGERY  2017   Cataract surgery & lasik  . GASTRIC BYPASS  2005   Laparoscopic  . IVC FILTER INSERTION  12/2003   TrapEase Vena Cava Filter  . LUMBAR LAMINECTOMY  06/2006   L4-L5 (spinal fusion)  . mini tummy tuck  05/07/2016   Bilateral  bra/back roll lift skin removal  . neck surgery C3-C7  02/10/2015   ACDF  . REDUCTION MAMMAPLASTY Bilateral 1997  . SHOULDER ACROMIOPLASTY Left 03/27/2013   w/ labral debridement  . SMALL INTESTINE SURGERY  12/24/2003   Lap Gastric Bypass  . SPINAL CORD STIMULATOR IMPLANT  11/2010   removed 05/2011  . SPINE SURGERY  2008 2017   Fusion L4-L5, ACDF C4-C7    Family Psychiatric History: I have reviewed family psychiatric history from my progress note on 05/15/2018.  Family History:  Family History  Problem Relation Age of Onset  . COPD Mother   . Lung cancer Mother   . Diabetes Mother   . Heart disease Mother   . Stroke Mother   . Heart attack Mother   . Arthritis Mother   . Cancer Mother   . Hypertension Mother   . Obesity Mother   . Varicose Veins Mother   . Heart disease Father   . Heart attack Father 55  . Early death Father   . Depression Sister        x 5 sisters  . COPD Sister   . Hypertension Sister   . Heart disease Brother        x 3 brothers  . Diabetes Brother   . Colon polyps Sister   . Hearing loss Sister   . Heart attack Sister   . Heart attack Brother   . Arthritis Sister   . Varicose Veins Sister   . Arthritis Brother   . Heart disease Brother   . Hypertension Brother   . Cancer Maternal Grandfather   . Stroke Maternal Grandfather   . COPD Sister   . Obesity Sister   . Depression Sister   . Depression Sister   . Heart disease Sister   . Hypertension Sister   . Diabetes Brother   . Heart disease Brother   . Hypertension Brother   . Obesity Brother   . Varicose Veins Brother   . Heart disease Brother   . Hypertension Brother   . Obesity Brother   . Hypertension Sister   . Obesity Sister   . Miscarriages / Stillbirths Maternal Aunt   . Miscarriages / Stillbirths Paternal Aunt   . Breast cancer Neg Hx     Social History: I have reviewed social history from my progress note on 05/15/2018. Social History   Socioeconomic History  . Marital  status: Married    Spouse name: Not on file  . Number of children: 1  . Years of education: [employer]  . Highest education level: Some college, no degree  Occupational History  . Not on file  Tobacco Use  . Smoking status: Former Smoker    Packs/day: 0.25    Years: 10.00    Pack years: 2.50    Types: Cigarettes    Quit date: 11/29/1995  Years since quitting: 23.6  . Smokeless tobacco: Former Systems developer  . Tobacco comment: Quite 1997, didnt smoke hardly at all when I was smoking.  Vaping Use  . Vaping Use: Never used  Substance and Sexual Activity  . Alcohol use: No  . Drug use: No  . Sexual activity: Yes    Birth control/protection: Condom, Post-menopausal, Surgical    Comment: Total Hysterectomy condoms  Other Topics Concern  . 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 Difficulty of Paying Living Expenses: Not very hard  Food Insecurity:   . Worried About Charity fundraiser in the Last Year:   . Arboriculturist in the Last Year:   Transportation Needs:   . Film/video editor (Medical):   Marland Kitchen Lack of Transportation (Non-Medical):   Physical Activity:   . Days of Exercise per Week:   . Minutes of Exercise per Session:   Stress:   . Feeling of Stress :   Social Connections:   . Frequency of Communication with Friends and Family:   . Frequency of Social Gatherings with Friends and Family:   . Attends Religious Services:   . Active Member of Clubs or Organizations:   . Attends Archivist Meetings:   Marland Kitchen Marital Status:     Allergies:  Allergies  Allergen Reactions  . Flagyl [Metronidazole] Anaphylaxis  . Cymbalta [Duloxetine Hcl]   . Tape Rash    Metabolic Disorder Labs: Lab Results  Component Value Date   HGBA1C 5.0 03/17/2019   MPG 97 03/17/2019   MPG 103 08/11/2018    No results found for: PROLACTIN Lab Results  Component Value Date   CHOL 142 03/17/2019   TRIG 101 03/17/2019   HDL 71 03/17/2019   CHOLHDL 2.0 03/17/2019   LDLCALC 53 03/17/2019   LDLCALC 44 08/11/2018   Lab Results  Component Value Date   TSH 0.86 08/11/2018   TSH 1.88 07/30/2017    Therapeutic Level Labs: No results found for: LITHIUM No results found for: VALPROATE No components found for:  CBMZ  Current Medications: Current Outpatient Medications  Medication Sig Dispense Refill  . albuterol (VENTOLIN HFA) 108 (90 Base) MCG/ACT inhaler Inhale 2 puffs into the lungs every 4 (four) hours as needed for wheezing or shortness of breath (cough). 8 g 2  . ARIPiprazole (ABILIFY) 10 MG tablet Take 0.5 tablets (5 mg total) by mouth daily. 15 tablet 2  . Biotin 10000 MCG TABS Take 10,000 mcg by mouth 1 day or 1 dose.    . butalbital-acetaminophen-caffeine (FIORICET) 50-325-40 MG tablet Take 1 tablet by mouth 2 (two) times daily as needed for headache.    . [facility] ASPIRIN LOW DOSE 81 MG EC tablet TAKE 1 TABLET BY MOUTH EVERY DAY 90 tablet 3  . estradiol (ESTRACE) 1 MG tablet Take 1 tablet (1 mg total) by mouth daily. 90 tablet 1  . etodolac (LODINE) 500 MG tablet Take 1 tablet (500 mg total) by mouth 2 (two) times daily. 180 tablet 1  . ezetimibe (ZETIA) 10 MG tablet Take 1 tablet (10 mg total) by mouth daily. 90 tablet 1  . ferrous sulfate 325 (65 FE) MG tablet ferrous sulfate 325 mg (65 mg iron) tablet  TAKE 1 TABLET BY MOUTH DAILY    . FERROUS SULFATE-FOLIC ACID  PO Take 1 tablet by mouth 2 (two) times daily.     . fluticasone (FLONASE) 50 MCG/ACT nasal spray SPRAY 2 SPRAYS INTO EACH NOSTRIL EVERY DAY 48 mL 3  . furosemide (LASIX) 20 MG tablet TAKE 1 TABLET (20 MG TOTAL) BY MOUTH DAILY AS NEEDED FOR FLUID OR EDEMA. 90 tablet 2  . gabapentin (NEURONTIN) 300 MG capsule Take 1 capsule (300 mg total) by mouth 3 (three) times daily. 270 capsule 3  . Magnesium 500 MG CAPS Once a day    .  Melatonin 10 MG CAPS Take 2 capsules by mouth at bedtime.     . Multiple Vitamin (MULTIVITAMIN WITH MINERALS) TABS tablet Take 1 tablet by mouth daily.    Marland Kitchen omeprazole (PRILOSEC) 40 MG capsule TAKE 1 CAPSULE BY MOUTH EVERY DAY 90 capsule 3  . ondansetron (ZOFRAN ODT) 4 MG disintegrating tablet Take 1 tablet (4 mg total) by mouth every 8 (eight) hours as needed for nausea or vomiting. 30 tablet 2  . oxyCODONE (OXY IR/ROXICODONE) 5 MG immediate release tablet Take 5 mg by mouth every 6 (six) hours as needed.     Marland Kitchen OZEMPIC, 0.25 OR 0.5 MG/DOSE, 2 MG/1.5ML SOPN Inject 0.1875 mLs (0.25 mg total) into the skin once a week. For first 4 weeks. Then increase dose to 0.5mg  weekly 1 pen 2  . propranolol (INDERAL) 10 MG tablet Take 1 tablet (10 mg total) by mouth 3 (three) times daily as needed. Severe anxiety attacks (Patient taking differently: Take 20 mg by mouth 3 (three) times daily as needed. Severe anxiety attacks pt taking 20 mg now) 270 tablet 1  . rosuvastatin (CRESTOR) 40 MG tablet TAKE 1 TABLET BY MOUTH EVERY DAY 30 tablet 4  . SAXENDA 18 MG/3ML SOPN Inject 0.5 mLs (3 mg total) into the skin daily. 15 mL 2  . tiZANidine (ZANAFLEX) 4 MG tablet Take 1 tablet (4 mg total) by mouth 3 (three) times daily. (Patient taking differently: Take 4 mg by mouth 4 (four) times daily. ) 90 tablet 1  . traZODone (DESYREL) 150 MG tablet TAKE 1 TABLET (150 MG TOTAL) BY MOUTH AT BEDTIME. (Patient taking differently: Take 75 mg by mouth at bedtime. ) 90 tablet 1  . venlafaxine XR (EFFEXOR-XR) 75 MG 24 hr capsule Take 1 capsule (75 mg total) by mouth daily with breakfast. 30 capsule 2  . vitamin E 100 UNIT capsule Take 100 mg by mouth daily.    Marland Kitchen XIIDRA 5 % SOLN INSTILL 1 DROP INTO BOTH EYES TWICE A DAY     No current facility-administered medications for this visit.     Musculoskeletal: Strength & Muscle Tone: UTA Gait & Station: normal Patient leans: N/A  Psychiatric Specialty Exam: Review of Systems   Psychiatric/Behavioral: Negative for agitation, behavioral problems, confusion, decreased concentration, dysphoric mood, hallucinations, self-injury, sleep disturbance and suicidal ideas. The patient is not nervous/anxious and is not hyperactive.   All other systems reviewed and are negative.   There were no vitals taken for this visit.There is no height or weight on file to calculate BMI.  General Appearance: Casual  Eye Contact:  Fair  Speech:  Clear and Coherent  Volume:  Normal  Mood:  Euthymic  Affect:  Congruent  Thought Process:  Goal Directed and Descriptions of Associations: Intact  Orientation:  Full (Time, Place, and Person)  Thought Content: Logical   Suicidal Thoughts:  No  Homicidal Thoughts:  No  Memory:  Immediate;   Fair Recent;   Fair  Remote;   Fair  Judgement:  Fair  Insight:  Fair  Psychomotor Activity:  Normal  Concentration:  Concentration: Fair and Attention Span: Fair  Recall:  [employer] of Knowledge: Fair  Language: Fair  Akathisia:  No  Handed:  Right  AIMS (if indicated): UTA  Assets:  Communication Skills Desire for Improvement Housing Social Support  ADL's:  Intact  Cognition: WNL  Sleep:  Fair   Screenings: GAD-7     Office Visit from 03/17/2019 in [facility] Office Visit from 11/20/2018 in [facility] Office Visit from 08/18/2018 in [facility] Office Visit from 07/22/2018 in [facility] Office Visit from 03/13/2018 in [facility]  Total GAD-7 Score 2 4 9 8 5     PHQ2-9     Office Visit from 06/18/2019 in [facility] Office Visit from 03/17/2019 in [facility] Office Visit from 12/16/2018 in [facility] Office Visit from 11/20/2018 in [facility] Office Visit from 09/09/2018 in [place]  PHQ-2 Total Score 0 0 0 2 0  PHQ-9 Total Score -- 2 -- 6 3       Assessment and Plan: Ashley Fields is  a 64 year old Caucasian female on disability, married, lives in [place], has a history of PTSD, bipolar disorder, panic attacks, migraine headaches, chronic pain was evaluated by telemedicine today.  Patient is biologically predisposed given her family history as well as her medical problems.  Patient with psychosocial stressors of multiple health issues, current pandemic.  Patient however is stable on current medication regimen and is tolerating the tapering off of Abilify well.  Plan as noted below.  Plan Bipolar disorder in remission We will reduce Abilify to 5 mg p.o. daily. Venlafaxine extended release 75 mg p.o. daily with breakfast  PTSD-stable Venlafaxine as prescribed Trazodone 75 mg p.o. nightly-reduced dosage. Melatonin 10 mg p.o. daily.  Panic attacks-stable Propranolol 20 mg p.o. 3 times daily as needed for anxiety attacks  Follow-up in clinic in 2 months or sooner if needed.  I have spent atleast 20 minutes non face to face with patient today. More than 50 % of the time was spent for preparing to see the patient ( e.g., review of test, records ),ordering medications and test ,psychoeducation and supportive psychotherapy and care coordination,as well as documenting clinical information in electronic health record. This note was generated in part or whole with voice recognition software. Voice recognition is usually quite accurate but there are transcription errors that can and very often do occur. I apologize for any typographical errors that were not detected and corrected.        Ursula Alert, MD 07/02/2019, 10:12 AM

## 2019-07-08 ENCOUNTER — Other Ambulatory Visit: Payer: Self-pay | Admitting: Family Medicine

## 2019-07-08 DIAGNOSIS — Z1231 Encounter for screening mammogram for malignant neoplasm of breast: Secondary | ICD-10-CM

## 2019-07-15 ENCOUNTER — Other Ambulatory Visit: Payer: Self-pay | Admitting: Family Medicine

## 2019-07-15 DIAGNOSIS — M5442 Lumbago with sciatica, left side: Secondary | ICD-10-CM

## 2019-07-15 DIAGNOSIS — G894 Chronic pain syndrome: Secondary | ICD-10-CM

## 2019-07-15 DIAGNOSIS — G8929 Other chronic pain: Secondary | ICD-10-CM

## 2019-07-15 NOTE — Telephone Encounter (Signed)
Requested Prescriptions  Pending Prescriptions Disp Refills   etodolac (LODINE) 500 MG tablet [Pharmacy Med Name: ETODOLAC 500 MG TABLET] 180 tablet 1    Sig: TAKE 1 TABLET BY MOUTH TWICE A DAY     Analgesics:  NSAIDS Failed - 07/15/2019  1:25 AM      Failed - Cr in normal range and within 360 days    Creat  Date Value Ref Range Status  03/17/2019 1.03 (H) 0.50 - 0.99 mg/dL Final    Comment:    For patients >63 years of age, the reference limit for Creatinine is approximately 13% higher for people identified as African-American. .          Passed - HGB in normal range and within 360 days    Hemoglobin  Date Value Ref Range Status  01/23/2019 13.8 12.0 - 15.0 g/dL Final         Passed - Patient is not pregnant      Passed - Valid encounter within last 12 months    Recent Outpatient Visits          3 weeks ago Atherosclerosis of native coronary artery of native heart with stable angina pectoris [facility])   [place], DO   1 month ago Right leg pain   [place], DO   1 month ago Left hand pain   [place], FNP   4 months ago Annual physical exam   [facility] Olin Hauser, DO   5 months ago Suspected COVID-19 virus infection   [place], DO      Future Appointments            In 2 months [place], Devonne Doughty, [facility], [facility]

## 2019-07-17 ENCOUNTER — Other Ambulatory Visit: Payer: Self-pay | Admitting: Child and Adolescent Psychiatry

## 2019-07-17 ENCOUNTER — Other Ambulatory Visit: Payer: Self-pay | Admitting: Family Medicine

## 2019-07-17 DIAGNOSIS — F3176 Bipolar disorder, in full remission, most recent episode depressed: Secondary | ICD-10-CM

## 2019-07-17 DIAGNOSIS — F4310 Post-traumatic stress disorder, unspecified: Secondary | ICD-10-CM

## 2019-07-17 DIAGNOSIS — F5104 Psychophysiologic insomnia: Secondary | ICD-10-CM

## 2019-07-17 NOTE — Telephone Encounter (Signed)
Dr. Eappen's patient

## 2019-07-17 NOTE — Telephone Encounter (Signed)
Requested  medications are  due for refill today yes  Requested medications are on the active medication list yes, however pt states taking 75mg  instead of 150.  Last refill 6/12  Last visit March 2021  Future visit scheduled yes   Notes to clinic Visit did not address Trazodone and pt taking different dose than ordered.

## 2019-07-21 ENCOUNTER — Ambulatory Visit (INDEPENDENT_AMBULATORY_CARE_PROVIDER_SITE_OTHER): Payer: 59 | Admitting: Family Medicine

## 2019-07-21 ENCOUNTER — Other Ambulatory Visit: Payer: Self-pay

## 2019-07-21 ENCOUNTER — Encounter: Payer: Self-pay | Admitting: Family Medicine

## 2019-07-21 VITALS — BP 98/50 | HR 80 | Temp 96.9°F | Resp 16 | Ht 62.0 in | Wt 196.0 lb

## 2019-07-21 DIAGNOSIS — K760 Fatty (change of) liver, not elsewhere classified: Secondary | ICD-10-CM

## 2019-07-21 DIAGNOSIS — Z8619 Personal history of other infectious and parasitic diseases: Secondary | ICD-10-CM | POA: Diagnosis not present

## 2019-07-21 DIAGNOSIS — I872 Venous insufficiency (chronic) (peripheral): Secondary | ICD-10-CM | POA: Diagnosis not present

## 2019-07-21 NOTE — Progress Notes (Signed)
Subjective:    Patient ID: Ashley Fields, female    DOB: [birth year], 64 y.o.   MRN: [id number]  Ashley Fields is a 64 y.o. female presenting on 07/21/2019 for Joint Swelling (left foot) and Numbness (left foot)   HPI   Lower Extremity Edema / Venous Insufficiency Reports new problem. Onset over past few weeks mostly Left foot ankle with Left lower extremity venous swelling, triggering numbness. She has worry today about concern of blood clot but she denies any pain or redness of the lower extremity, no obvious asymmetry. She is able to walk on it without problem. Says bothered by some tingling numbness, swelling worse during day afternoon if on her feet and very active, but does improve with elevation and she is not using compression yet at this point, has seen vascular specialist For obesity, she is  Now on Ozempic  History of Hepatitis C / s/p Harvoni treatment Last RUQ US Liver done 2019, due for repeat and lab HCV Viral load testing. Asymptomatic.   Depression screen [facility] 2/9 06/18/2019 03/17/2019 12/16/2018  Decreased Interest 0 0 0  Down, Depressed, Hopeless 0 0 0  PHQ - 2 Score 0 0 0  Altered sleeping - 1 -  Tired, decreased energy - 1 -  Change in appetite - 0 -  Feeling bad or failure about yourself  - 0 -  Trouble concentrating - 0 -  Moving slowly or fidgety/restless - 0 -  Suicidal thoughts - 0 -  PHQ-9 Score - 2 -  Difficult doing work/chores - Somewhat difficult -  Some recent data might be hidden    Social History   Tobacco Use  . Smoking status: Former Smoker    Packs/day: 0.25    Years: 10.00    Pack years: 2.50    Types: Cigarettes    Quit date: 11/29/1995    Years since quitting: 23.6  . Smokeless tobacco: Former Systems developer  . Tobacco comment: Quite 1997, didnt smoke hardly at all when I was smoking.  Vaping Use  . Vaping Use: Never used  Substance Use Topics  . Alcohol use: No  . Drug use: No    Review of Systems Per HPI unless specifically indicated  above     Objective:    BP (!) 98/50   Pulse 80   Temp (!) 96.9 F (36.1 C) (Temporal)   Resp 16   Ht 5\' 2"  (1.575 m)   Wt 196 lb (88.9 kg)   SpO2 100%   BMI 35.85 kg/m   Wt Readings from Last 3 Encounters:  07/21/19 196 lb (88.9 kg)  06/18/19 196 lb (88.9 kg)  05/28/19 201 lb 9.6 oz (91.4 kg)    Physical Exam Vitals and nursing note reviewed.  Constitutional:      General: She is not in acute distress.    Appearance: She is well-developed. She is not diaphoretic.     Comments: Well-appearing, comfortable, cooperative  HENT:     Head: Normocephalic and atraumatic.  Eyes:     General:        Right eye: No discharge.        Left eye: No discharge.     Conjunctiva/sclera: Conjunctivae normal.  Cardiovascular:     Rate and Rhythm: Normal rate.  Pulmonary:     Effort: Pulmonary effort is normal.  Musculoskeletal:     Right lower leg: Edema present.     Left lower leg: Edema (non pitting edema 1+ seems mostly related to obesity.  Not erythema, non tender, no pitting.) present.  Skin:    General: Skin is warm and dry.     Findings: No erythema or rash.  Neurological:     Mental Status: She is alert and oriented to person, place, and time.  Psychiatric:        Behavior: Behavior normal.     Comments: Well groomed, good eye contact, normal speech and thoughts      I have personally reviewed the radiology report from 08/02/17 on Abdominal US.   CLINICAL DATA:  Hepatitis-C.  EXAM: ULTRASOUND ABDOMEN LIMITED RIGHT UPPER QUADRANT  COMPARISON:  No recent prior.  FINDINGS: Gallbladder:  Cholecystectomy.  Common bile duct:  Diameter: 3.0 mm  Liver:  Increased echogenicity consistent fatty infiltration and/or hepatocellular disease. No focal hepatic abnormality identified. Portal vein is patent on color Doppler imaging with normal direction of blood flow towards the liver.  IMPRESSION: 1.  Cholecystectomy.  No biliary distention.  2. Increased  hepatic echogenicity consistent fatty infiltration and/or hepatocellular disease.   Electronically Signed   By: Marcello Moores  Register   On: 08/02/2017 09:55  Results for orders placed or performed in visit on 03/17/19  Hemoglobin A1c  Result Value Ref Range   Hgb A1c MFr Bld 5.0 <5.7 % of total Hgb   Mean Plasma Glucose 97 (calc)   eAG (mmol/L) 5.4 (calc)  COMPLETE METABOLIC PANEL WITH GFR  Result Value Ref Range   Glucose, Bld 95 65 - 99 mg/dL   BUN 13 7 - 25 mg/dL   Creat 1.03 (H) 0.50 - 0.99 mg/dL   GFR, Est Non African American 58 (L) > OR = 60 mL/min/1.86m2   GFR, Est African American 67 > OR = 60 mL/min/1.107m2   BUN/Creatinine Ratio 13 6 - 22 (calc)   Sodium 143 135 - 146 mmol/L   Potassium 4.6 3.5 - 5.3 mmol/L   Chloride 106 98 - 110 mmol/L   CO2 33 (H) 20 - 32 mmol/L   Calcium 9.2 8.6 - 10.4 mg/dL   Total Protein 6.8 6.1 - 8.1 g/dL   Albumin 4.3 3.6 - 5.1 g/dL   Globulin 2.5 1.9 - 3.7 g/dL (calc)   AG Ratio 1.7 1.0 - 2.5 (calc)   Total Bilirubin 0.6 0.2 - 1.2 mg/dL   Alkaline phosphatase (APISO) 53 37 - 153 U/L   AST 22 10 - 35 U/L   ALT 20 6 - 29 U/L  Lipid panel  Result Value Ref Range   Cholesterol 142 <200 mg/dL   HDL 71 > OR = 50 mg/dL   Triglycerides 101 <150 mg/dL   LDL Cholesterol (Calc) 53 mg/dL (calc)   Total CHOL/HDL Ratio 2.0 <5.0 (calc)   Non-HDL Cholesterol (Calc) 71 <130 mg/dL (calc)  Vitamin B12  Result Value Ref Range   Vitamin B-12 486 200 - 1,100 pg/mL  VITAMIN D 25 Hydroxy (Vit-D Deficiency, Fractures)  Result Value Ref Range   Vit D, 25-Hydroxy 21 (L) 30 - 100 ng/mL  Iron, TIBC and Ferritin Panel  Result Value Ref Range   Iron 71 45 - 160 mcg/dL   TIBC 401 250 - 450 mcg/dL (calc)   %SAT 18 16 - 45 % (calc)   Ferritin 23 16 - 288 ng/mL  TEST AUTHORIZATION  Result Value Ref Range   TEST NAME: IRON, TIBC AND FERRITIN PANEL    TEST CODE: [id number]    CLIENT CONTACT: DR Parks Ranger    REPORT ALWAYS MESSAGE SIGNATURE  Assessment  & Plan:   Problem List Items Addressed This Visit    Morbid obesity ([place])   Relevant Medications   phentermine (ADIPEX-P) 37.5 MG tablet   History of hepatitis C   Relevant Orders   US Abdomen Limited RUQ   Hepatitis C RNA quantitative   Hepatic steatosis   Relevant Orders   US Abdomen Limited RUQ    Other Visit Diagnoses    Venous insufficiency of left lower extremity    -  Primary     #Venous insufficiency / LE Edema Morbid obesity is primary risk factor for her, also seems episodic worse in warmer temperature and affected by activity seems more functional swelling problem. - No focal evidence of acute DVT. Well's score is 0 unlikely - RICE therapy, compression can use ACE wrap, elevation, improve fluid balance - She has diuretic furosemide 20mg  PRN doesn't resolve this issue can use if needed - Discussed future step would involve referral to Vascular may consider venous reflux [place] testing if indicated and further management. - Goal weight loss, keep on ozempic  #History of Hepatitis C, treated w/ Harvoni in past Due for routine surveillance, RUQ US Abdomen and HCV Lab RNA viral load.   No orders of the defined types were placed in this encounter.   Follow up plan: Return in about 4 weeks (around 08/18/2019), or if symptoms worsen or fail to improve, for venous insufficency.   Nobie Putnam, [facility] 07/21/2019, 9:51 AM

## 2019-07-21 NOTE — Patient Instructions (Addendum)
Thank you for coming to the office today.  Likely swelling due to venous insufficiency, difficulty veins pumping fluid out of leg, can be worse in summer warmer weather on feet more, and also weight can put more pressure on veins.  Use RICE therapy: - R - Rest / relative rest with activity modification avoid overuse of joint - I - Ice packs (make sure you use a towel or sock / something to protect skin) - C - Compression with compression stocking / ACE wrap to apply pressure and reduce swelling allowing more support - E - Elevation - if significant swelling, lift leg above heart level (toes above your nose) to help reduce swelling, most helpful at night after day of being on your feet  Likely numbness from compression on nerve  Unlikely DVT  Use furosemide if needed  We can refer to Vascular specialist for circulation evaluation if not improving.   Please schedule a Follow-up Appointment to: Return in about 4 weeks (around 08/18/2019), or if symptoms worsen or fail to improve, for venous insufficency.  If you have any other questions or concerns, please feel free to call the office or send a message through [place]. You may also schedule an earlier appointment if necessary.  Additionally, you may be receiving a survey about your experience at our office within a few days to 1 week by e-mail or mail. We value your feedback.  Nobie Putnam, DO [place]

## 2019-07-23 ENCOUNTER — Encounter: Payer: Self-pay | Admitting: Internal Medicine

## 2019-07-23 ENCOUNTER — Ambulatory Visit: Payer: Medicare HMO

## 2019-07-23 NOTE — Progress Notes (Signed)
Patient was called for pre assessment. She denies any pain or concerns at this time.  

## 2019-07-24 ENCOUNTER — Inpatient Hospital Stay: Payer: 59 | Attending: Internal Medicine

## 2019-07-24 ENCOUNTER — Inpatient Hospital Stay (HOSPITAL_BASED_OUTPATIENT_CLINIC_OR_DEPARTMENT_OTHER): Payer: 59 | Admitting: Internal Medicine

## 2019-07-24 ENCOUNTER — Inpatient Hospital Stay: Payer: 59

## 2019-07-24 ENCOUNTER — Other Ambulatory Visit: Payer: Self-pay

## 2019-07-24 ENCOUNTER — Ambulatory Visit
Admission: RE | Admit: 2019-07-24 | Discharge: 2019-07-24 | Disposition: A | Discharge status: Home / Self Care | Payer: 59 | Source: Ambulatory Visit | Attending: Family Medicine | Admitting: Family Medicine

## 2019-07-24 VITALS — BP 123/72 | HR 68 | Resp 18

## 2019-07-24 VITALS — BP 134/71 | HR 80 | Temp 97.2°F | Resp 18 | Ht 62.0 in | Wt 195.6 lb

## 2019-07-24 DIAGNOSIS — Z8619 Personal history of other infectious and parasitic diseases: Secondary | ICD-10-CM

## 2019-07-24 DIAGNOSIS — Z90722 Acquired absence of ovaries, bilateral: Secondary | ICD-10-CM | POA: Diagnosis not present

## 2019-07-24 DIAGNOSIS — K760 Fatty (change of) liver, not elsewhere classified: Secondary | ICD-10-CM | POA: Insufficient documentation

## 2019-07-24 DIAGNOSIS — Z9884 Bariatric surgery status: Secondary | ICD-10-CM | POA: Insufficient documentation

## 2019-07-24 DIAGNOSIS — E559 Vitamin D deficiency, unspecified: Secondary | ICD-10-CM | POA: Insufficient documentation

## 2019-07-24 DIAGNOSIS — E079 Disorder of thyroid, unspecified: Secondary | ICD-10-CM | POA: Insufficient documentation

## 2019-07-24 DIAGNOSIS — Z90710 Acquired absence of both cervix and uterus: Secondary | ICD-10-CM | POA: Diagnosis not present

## 2019-07-24 DIAGNOSIS — F419 Anxiety disorder, unspecified: Secondary | ICD-10-CM | POA: Diagnosis not present

## 2019-07-24 DIAGNOSIS — Z8349 Family history of other endocrine, nutritional and metabolic diseases: Secondary | ICD-10-CM | POA: Diagnosis not present

## 2019-07-24 DIAGNOSIS — R69 Illness, unspecified: Secondary | ICD-10-CM | POA: Diagnosis not present

## 2019-07-24 DIAGNOSIS — Z833 Family history of diabetes mellitus: Secondary | ICD-10-CM | POA: Insufficient documentation

## 2019-07-24 DIAGNOSIS — D508 Other iron deficiency anemias: Secondary | ICD-10-CM

## 2019-07-24 DIAGNOSIS — K219 Gastro-esophageal reflux disease without esophagitis: Secondary | ICD-10-CM | POA: Diagnosis not present

## 2019-07-24 DIAGNOSIS — F329 Major depressive disorder, single episode, unspecified: Secondary | ICD-10-CM | POA: Insufficient documentation

## 2019-07-24 DIAGNOSIS — Z801 Family history of malignant neoplasm of trachea, bronchus and lung: Secondary | ICD-10-CM | POA: Diagnosis not present

## 2019-07-24 DIAGNOSIS — Z87891 Personal history of nicotine dependence: Secondary | ICD-10-CM | POA: Insufficient documentation

## 2019-07-24 DIAGNOSIS — K7689 Other specified diseases of liver: Secondary | ICD-10-CM | POA: Diagnosis not present

## 2019-07-24 DIAGNOSIS — K912 Postsurgical malabsorption, not elsewhere classified: Secondary | ICD-10-CM | POA: Insufficient documentation

## 2019-07-24 DIAGNOSIS — Z79899 Other long term (current) drug therapy: Secondary | ICD-10-CM | POA: Diagnosis not present

## 2019-07-24 DIAGNOSIS — N189 Chronic kidney disease, unspecified: Secondary | ICD-10-CM | POA: Insufficient documentation

## 2019-07-24 DIAGNOSIS — Z9079 Acquired absence of other genital organ(s): Secondary | ICD-10-CM | POA: Insufficient documentation

## 2019-07-24 DIAGNOSIS — E785 Hyperlipidemia, unspecified: Secondary | ICD-10-CM | POA: Diagnosis not present

## 2019-07-24 DIAGNOSIS — Z8249 Family history of ischemic heart disease and other diseases of the circulatory system: Secondary | ICD-10-CM | POA: Insufficient documentation

## 2019-07-24 DIAGNOSIS — D696 Thrombocytopenia, unspecified: Secondary | ICD-10-CM | POA: Insufficient documentation

## 2019-07-24 LAB — CBC WITH DIFFERENTIAL/PLATELET
Abs Immature Granulocytes: 0.01 10*3/uL (ref 0.00–0.07)
Basophils Absolute: 0 10*3/uL (ref 0.0–0.1)
Basophils Relative: 1 %
Eosinophils Absolute: 0.1 10*3/uL (ref 0.0–0.5)
Eosinophils Relative: 3 %
HCT: 40.4 % (ref 36.0–46.0)
Hemoglobin: 13.6 g/dL (ref 12.0–15.0)
Immature Granulocytes: 0 %
Lymphocytes Relative: 30 %
Lymphs Abs: 1 10*3/uL (ref 0.7–4.0)
MCH: 30.1 pg (ref 26.0–34.0)
MCHC: 33.7 g/dL (ref 30.0–36.0)
MCV: 89.4 fL (ref 80.0–100.0)
Monocytes Absolute: 0.2 10*3/uL (ref 0.1–1.0)
Monocytes Relative: 6 %
Neutro Abs: 2 10*3/uL (ref 1.7–7.7)
Neutrophils Relative %: 60 %
Platelets: 139 10*3/uL — ABNORMAL LOW (ref 150–400)
RBC: 4.52 MIL/uL (ref 3.87–5.11)
RDW: 12.4 % (ref 11.5–15.5)
WBC: 3.4 10*3/uL — ABNORMAL LOW (ref 4.0–10.5)
nRBC: 0 % (ref 0.0–0.2)

## 2019-07-24 LAB — IRON AND TIBC
Iron: 94 ug/dL (ref 28–170)
Saturation Ratios: 21 % (ref 10.4–31.8)
TIBC: 458 ug/dL — ABNORMAL HIGH (ref 250–450)
UIBC: 364 ug/dL

## 2019-07-24 LAB — VITAMIN B12: Vitamin B-12: 631 pg/mL (ref 180–914)

## 2019-07-24 LAB — FERRITIN: Ferritin: 20 ng/mL (ref 11–307)

## 2019-07-24 LAB — HEPATITIS C RNA QUANTITATIVE
HCV Quantitative Log: <1.18 Log IU/mL
HCV RNA, PCR, QN: <15 IU/mL

## 2019-07-24 MED ORDER — IRON SUCROSE 20 MG/ML IV SOLN
200.0000 mg | Freq: Once | INTRAVENOUS | Status: AC
  Administered 2019-07-24: 200 mg via INTRAVENOUS
  Filled 2019-07-24: qty 10

## 2019-07-24 MED ORDER — SODIUM CHLORIDE 0.9 % IV SOLN
Freq: Once | INTRAVENOUS | Status: AC
  Filled 2019-07-24: qty 250

## 2019-07-24 NOTE — Progress Notes (Signed)
No new changes noted today 

## 2019-07-24 NOTE — Assessment & Plan Note (Addendum)
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  On IV iron.  Positive fatigue.  #Proceed with Venofer today.  Hemoglobin 13; saturation 20%.  # B12 malabsorption-continue oral B12 [intolerance to SL/IM home];   #Vitamin D deficiency-malabsorption;on vitamin D 5000 units.  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

## 2019-07-24 NOTE — Addendum Note (Signed)
Addended by: Ruthell Rummage A on: 07/24/2019 01:40 PM   Modules accepted: Orders

## 2019-07-24 NOTE — Progress Notes (Signed)
[facility] CONSULT NOTE  Patient Care Team: Olin Hauser, DO as PCP - General (Family Medicine)  CHIEF COMPLAINTS/PURPOSE OF CONSULTATION: Iron deficiency   HEMATOLOGY HISTORY  # ANEMIA IRON DEFICIENCY GASTRIC BYPASS [2005 [place]] EGD-none; colonoscopy-2015- few polyps snared [[place]]; IV venofer x3; fall 2020/ PO B12 [intol to SL b12]   HISTORY OF PRESENTING ILLNESS:  Ashley Fields 64 y.o.  female history of iron deficiency secondary to gastric bypass is here for follow-up.  Denies any nausea vomiting.  Admits to ongoing fatigue.  No blood in stools no black or stools.   Review of Systems  Constitutional: Negative for chills, diaphoresis, fever and weight loss.  HENT: Negative for nosebleeds and sore throat.   Eyes: Negative for double vision.  Respiratory: Negative for cough, hemoptysis, sputum production and wheezing.   Cardiovascular: Negative for chest pain, palpitations, orthopnea and leg swelling.  Gastrointestinal: Negative for abdominal pain, blood in stool, constipation, diarrhea, heartburn, melena, nausea and vomiting.  Genitourinary: Negative for dysuria, frequency and urgency.  Musculoskeletal: Negative for back pain and joint pain.  Skin: Negative.  Negative for itching and rash.  Neurological: Negative for dizziness, tingling, focal weakness, weakness and headaches.  Endo/Heme/Allergies: Does not bruise/bleed easily.  Psychiatric/Behavioral: Negative for depression. The patient is not nervous/anxious and does not have insomnia.     MEDICAL HISTORY:  Past Medical History:  Diagnosis Date  . Allergy   . Anemia   . Anxiety   . Arthritis    Yes  . Blood transfusion without reported diagnosis 1977   Transfusions from miscarriage & hemorrhaging  . Chronic kidney disease   . Depression   . Disp fx of cuboid bone of right foot, init for clos fx 01/01/2017  . GERD (gastroesophageal reflux disease)   . Headache   . Hepatitis C   .  Hyperlipidemia   . Neuromuscular disorder ([place]) 1997   Falling to much was an eye-opener  . Osteoporosis   . Thyroid disease   . Urine incontinence     SURGICAL HISTORY: Past Surgical History:  Procedure Laterality Date  . 5 miscarriages     1977-1985, Blood transfusion s/p miscarriage 1977  . ABDOMINAL HYSTERECTOMY  1987   Total  . APPENDECTOMY  12/2008  . AUGMENTATION MAMMAPLASTY Bilateral 2015   Bilat  . AUGMENTATION MAMMAPLASTY Bilateral 2018   implants redone w/ placement of implants under muscle  . breast lift bilateral, implants  67/20/9470   bilateral, silicon naturel  . BREAST REDUCTION SURGERY Bilateral 1997  . CESAREAN SECTION  07/27/1981   Placenta Previa  . CHOLECYSTECTOMY  03/2004   Lap surgery  . COSMETIC SURGERY     Breast implants w/ lift, tummy tuck, upper & lower Blepharop  . EYE SURGERY  2017   Cataract surgery & lasik  . GASTRIC BYPASS  2005   Laparoscopic  . IVC FILTER INSERTION  12/2003   TrapEase Vena Cava Filter  . LUMBAR LAMINECTOMY  06/2006   L4-L5 (spinal fusion)  . mini tummy tuck  05/07/2016   Bilateral bra/back roll lift skin removal  . neck surgery C3-C7  02/10/2015   ACDF  . REDUCTION MAMMAPLASTY Bilateral 1997  . SHOULDER ACROMIOPLASTY Left 03/27/2013   w/ labral debridement  . SMALL INTESTINE SURGERY  12/24/2003   Lap Gastric Bypass  . SPINAL CORD STIMULATOR IMPLANT  11/2010   removed 05/2011  . SPINE SURGERY  2008 2017   Fusion L4-L5, ACDF C4-C7    SOCIAL HISTORY:  Social History   Socioeconomic History  . Marital status: Married    Spouse name: Not on file  . Number of children: 1  . Years of education: [employer]  . Highest education level: Some college, no degree  Occupational History  . Not on file  Tobacco Use  . Smoking status: Former Smoker    Packs/day: 0.25    Years: 10.00    Pack years: 2.50    Types: Cigarettes    Quit date: 11/29/1995    Years since quitting: 23.6  . Smokeless tobacco: Former Systems developer   . Tobacco comment: Quite 1997, didnt smoke hardly at all when I was smoking.  Vaping Use  . Vaping Use: Never used  Substance and Sexual Activity  . Alcohol use: No  . Drug use: No  . Sexual activity: Yes    Birth control/protection: Condom, Post-menopausal, Surgical    Comment: Total Hysterectomy condoms  Other Topics Concern  . 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 Difficulty of Paying Living Expenses: Not very hard  Food Insecurity:   . Worried About Charity fundraiser in the Last Year:   . Arboriculturist in the Last Year:   Transportation Needs:   . Film/video editor (Medical):   Marland Kitchen Lack of Transportation (Non-Medical):   Physical Activity:   . Days of Exercise per Week:   . Minutes of Exercise per Session:   Stress:   . Feeling of Stress :   Social Connections:   . Frequency of Communication with Friends and Family:   . Frequency of Social Gatherings with Friends and Family:   . Attends Religious Services:   . Active Member of Clubs or Organizations:   . Attends Archivist Meetings:   Marland Kitchen Marital Status:   Intimate Partner Violence:   . Fear of Current or Ex-Partner:   . Emotionally Abused:   Marland Kitchen Physically Abused:   . Sexually Abused:     FAMILY HISTORY: Family History  Problem Relation Age of Onset  . COPD Mother   . Lung cancer Mother   . Diabetes Mother   . Heart disease Mother   . Stroke Mother   . Heart attack Mother   . Arthritis Mother   . Cancer Mother   . Hypertension Mother   . Obesity Mother   . Varicose Veins Mother   . Heart disease Father   . Heart attack Father 43  . Early death Father   . Depression Sister        x 5 sisters  . COPD Sister   . Hypertension Sister   . Heart disease Brother        x 3 brothers  . Diabetes Brother   . Colon  polyps Sister   . Hearing loss Sister   . Heart attack Sister   . Heart attack Brother   . Arthritis Sister   . Varicose Veins Sister   . Arthritis Brother   . Heart disease Brother   . Hypertension Brother   . Cancer Maternal Grandfather   . Stroke Maternal Grandfather   . COPD Sister   . Obesity Sister   . Depression Sister   . Depression Sister   . Heart disease Sister   . Hypertension Sister   .  Diabetes Brother   . Heart disease Brother   . Hypertension Brother   . Obesity Brother   . Varicose Veins Brother   . Heart disease Brother   . Hypertension Brother   . Obesity Brother   . Hypertension Sister   . Obesity Sister   . Miscarriages / Stillbirths Maternal Aunt   . Miscarriages / Stillbirths Paternal Aunt   . Breast cancer Neg Hx     ALLERGIES:  is allergic to flagyl [metronidazole], cymbalta [duloxetine hcl], and tape.  MEDICATIONS:  Current Outpatient Medications  Medication Sig Dispense Refill  . albuterol (VENTOLIN HFA) 108 (90 Base) MCG/ACT inhaler Inhale 2 puffs into the lungs every 4 (four) hours as needed for wheezing or shortness of breath (cough). 8 g 2  . ARIPiprazole (ABILIFY) 10 MG tablet Take 0.5 tablets (5 mg total) by mouth daily. 15 tablet 2  . Biotin 10000 MCG TABS Take 10,000 mcg by mouth 1 day or 1 dose.    . butalbital-acetaminophen-caffeine (FIORICET) 50-325-40 MG tablet Take 1 tablet by mouth 2 (two) times daily as needed for headache.    . [facility] ASPIRIN LOW DOSE 81 MG EC tablet TAKE 1 TABLET BY MOUTH EVERY DAY 90 tablet 3  . estradiol (ESTRACE) 1 MG tablet Take 1 tablet (1 mg total) by mouth daily. 90 tablet 1  . etodolac (LODINE) 500 MG tablet TAKE 1 TABLET BY MOUTH TWICE A DAY 180 tablet 1  . ezetimibe (ZETIA) 10 MG tablet Take 1 tablet (10 mg total) by mouth daily. 90 tablet 1  . ferrous sulfate 325 (65 FE) MG tablet ferrous sulfate 325 mg (65 mg iron) tablet  TAKE 1 TABLET BY MOUTH DAILY    . FERROUS SULFATE-FOLIC ACID PO Take 1 tablet  by mouth 2 (two) times daily.     . fluticasone (FLONASE) 50 MCG/ACT nasal spray SPRAY 2 SPRAYS INTO EACH NOSTRIL EVERY DAY 48 mL 3  . furosemide (LASIX) 20 MG tablet TAKE 1 TABLET (20 MG TOTAL) BY MOUTH DAILY AS NEEDED FOR FLUID OR EDEMA. 90 tablet 2  . gabapentin (NEURONTIN) 300 MG capsule Take 1 capsule (300 mg total) by mouth 3 (three) times daily. 270 capsule 3  . Magnesium 500 MG CAPS Once a day    . Melatonin 10 MG CAPS Take 2 capsules by mouth at bedtime.     . Multiple Vitamin (MULTIVITAMIN WITH MINERALS) TABS tablet Take 1 tablet by mouth daily.    Marland Kitchen omeprazole (PRILOSEC) 40 MG capsule TAKE 1 CAPSULE BY MOUTH EVERY DAY 90 capsule 3  . ondansetron (ZOFRAN ODT) 4 MG disintegrating tablet Take 1 tablet (4 mg total) by mouth every 8 (eight) hours as needed for nausea or vomiting. 30 tablet 2  . oxyCODONE (OXY IR/ROXICODONE) 5 MG immediate release tablet Take 5 mg by mouth every 6 (six) hours as needed.     Marland Kitchen OZEMPIC, 0.25 OR 0.5 MG/DOSE, 2 MG/1.5ML SOPN Inject 0.1875 mLs (0.25 mg total) into the skin once a week. For first 4 weeks. Then increase dose to 0.5mg  weekly 1 pen 2  . phentermine (ADIPEX-P) 37.5 MG tablet Take by mouth.    . propranolol (INDERAL) 10 MG tablet Take 1 tablet (10 mg total) by mouth 3 (three) times daily as needed. Severe anxiety attacks (Patient taking differently: Take 20 mg by mouth 3 (three) times daily as needed. Severe anxiety attacks pt taking 20 mg now) 270 tablet 1  . rosuvastatin (CRESTOR) 40 MG tablet TAKE 1 TABLET BY  MOUTH EVERY DAY 30 tablet 4  . tiZANidine (ZANAFLEX) 4 MG tablet Take 1 tablet (4 mg total) by mouth 3 (three) times daily. (Patient taking differently: Take 4 mg by mouth 4 (four) times daily. ) 90 tablet 1  . traZODone (DESYREL) 150 MG tablet Take 0.5 tablets (75 mg total) by mouth at bedtime. 45 tablet 1  . venlafaxine XR (EFFEXOR-XR) 75 MG 24 hr capsule Take 1 capsule (75 mg total) by mouth daily with breakfast. 30 capsule 2  . vitamin E 100  UNIT capsule Take 100 mg by mouth daily.    Marland Kitchen XIIDRA 5 % SOLN INSTILL 1 DROP INTO BOTH EYES TWICE A DAY     No current facility-administered medications for this visit.      PHYSICAL EXAMINATION:   Vitals:   07/24/19 1259  BP: 134/71  Pulse: 80  Resp: 18  Temp: (!) 97.2 F (36.2 C)  SpO2: 100%   Filed Weights   07/24/19 1259  Weight: 195 lb 9.6 oz (88.7 kg)    Physical Exam HENT:     Head: Normocephalic and atraumatic.     Mouth/Throat:     Pharynx: No oropharyngeal exudate.  Eyes:     Pupils: Pupils are equal, round, and reactive to light.  Cardiovascular:     Rate and Rhythm: Normal rate and regular rhythm.  Pulmonary:     Effort: Pulmonary effort is normal. No respiratory distress.     Breath sounds: Normal breath sounds. No wheezing.  Abdominal:     General: Bowel sounds are normal. There is no distension.     Palpations: Abdomen is soft. There is no mass.     Tenderness: There is no abdominal tenderness. There is no guarding or rebound.  Musculoskeletal:        General: No tenderness. Normal range of motion.     Cervical back: Normal range of motion and neck supple.  Skin:    General: Skin is warm.  Neurological:     Mental Status: She is alert and oriented to person, place, and time.  Psychiatric:        Mood and Affect: Affect normal.     LABORATORY DATA:  I have reviewed the data as listed Lab Results  Component Value Date   WBC 3.4 (L) 07/24/2019   HGB 13.6 07/24/2019   HCT 40.4 07/24/2019   MCV 89.4 07/24/2019   PLT 139 (L) 07/24/2019   Recent Labs    08/11/18 0804 11/20/18 0840 03/17/19 0908  NA 144 142 143  K 5.2 4.3 4.6  CL 108 105 106  CO2 29 28 33*  GLUCOSE 95 86 95  BUN 16 18 13   CREATININE 0.79 0.99 1.03*  CALCIUM 9.0 9.1 9.2  GFRNONAA 80 61 58*  GFRAA 93 71 67  PROT 6.5  --  6.8  AST 22  --  22  ALT 16  --  20  BILITOT 0.7  --  0.6     DG Cervical Spine Complete  Result Date: 06/29/2019 CLINICAL DATA:  Fall,  chronic neck pain, worse, history of anterior cervical discectomy and fusion C4 through C7 EXAM: CERVICAL SPINE - COMPLETE 4+ VIEW COMPARISON:  08/16/2016 FINDINGS: No fracture or static subluxation of the cervical spine. Redemonstrated postoperative findings of anterior cervical discectomy and fusion from C4 through C7. No evidence of perihardware fracture or loosening. There is moderate disc space height loss and osteophytosis at C3-C4. Remaining disc levels are intact. No significant bony neural foraminal  stenosis. Partially imaged skull base, cervical soft tissues, and upper chest are unremarkable. IMPRESSION: 1. No fracture or static subluxation of the cervical spine. 2. Redemonstrated postoperative findings of anterior cervical discectomy and fusion from C4 through C7. No evidence of perihardware fracture or loosening. There is moderate disc space height loss and osteophytosis at C3-C4. Remaining disc levels are intact. Electronically Signed   By: Eddie Candle M.D.   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  On IV iron.  Positive fatigue.  #Proceed with Venofer today.  Hemoglobin 13; saturation 20%.  # B12 malabsorption-continue oral B12 [intolerance to SL/IM home];   #Vitamin D deficiency-malabsorption;on vitamin D 5000 units.  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 The patient knows to call the clinic with any problems, questions or concerns.    Cammie Sickle, MD 07/24/2019 1:16 PM

## 2019-07-25 ENCOUNTER — Other Ambulatory Visit: Payer: Self-pay | Admitting: Psychiatry

## 2019-07-25 DIAGNOSIS — F3176 Bipolar disorder, in full remission, most recent episode depressed: Secondary | ICD-10-CM

## 2019-08-08 ENCOUNTER — Other Ambulatory Visit: Payer: Self-pay | Admitting: Psychiatry

## 2019-08-08 DIAGNOSIS — F4310 Post-traumatic stress disorder, unspecified: Secondary | ICD-10-CM

## 2019-08-08 DIAGNOSIS — F3176 Bipolar disorder, in full remission, most recent episode depressed: Secondary | ICD-10-CM

## 2019-08-08 DIAGNOSIS — F410 Panic disorder [episodic paroxysmal anxiety] without agoraphobia: Secondary | ICD-10-CM

## 2019-08-12 ENCOUNTER — Other Ambulatory Visit: Payer: Self-pay | Admitting: Family Medicine

## 2019-08-12 DIAGNOSIS — E782 Mixed hyperlipidemia: Secondary | ICD-10-CM

## 2019-08-14 DIAGNOSIS — E669 Obesity, unspecified: Secondary | ICD-10-CM | POA: Diagnosis not present

## 2019-08-19 ENCOUNTER — Other Ambulatory Visit: Payer: Self-pay | Admitting: Child and Adolescent Psychiatry

## 2019-08-19 DIAGNOSIS — F3176 Bipolar disorder, in full remission, most recent episode depressed: Secondary | ICD-10-CM

## 2019-08-19 NOTE — Telephone Encounter (Signed)
Dr. Eappen's patient

## 2019-08-24 ENCOUNTER — Encounter: Payer: Self-pay | Admitting: Family Medicine

## 2019-08-24 ENCOUNTER — Ambulatory Visit
Admission: RE | Admit: 2019-08-24 | Discharge: 2019-08-24 | Disposition: A | Discharge status: Home / Self Care | Payer: 59 | Source: Ambulatory Visit | Attending: Family Medicine | Admitting: Family Medicine

## 2019-08-24 ENCOUNTER — Other Ambulatory Visit: Payer: Self-pay

## 2019-08-24 DIAGNOSIS — Z13820 Encounter for screening for osteoporosis: Secondary | ICD-10-CM | POA: Diagnosis not present

## 2019-08-24 DIAGNOSIS — Z90710 Acquired absence of both cervix and uterus: Secondary | ICD-10-CM | POA: Diagnosis not present

## 2019-08-24 DIAGNOSIS — M810 Age-related osteoporosis without current pathological fracture: Secondary | ICD-10-CM | POA: Insufficient documentation

## 2019-08-24 DIAGNOSIS — Z1231 Encounter for screening mammogram for malignant neoplasm of breast: Secondary | ICD-10-CM | POA: Diagnosis present

## 2019-08-24 DIAGNOSIS — Z780 Asymptomatic menopausal state: Secondary | ICD-10-CM | POA: Diagnosis not present

## 2019-08-24 DIAGNOSIS — Z90722 Acquired absence of ovaries, bilateral: Secondary | ICD-10-CM | POA: Diagnosis not present

## 2019-08-24 DIAGNOSIS — M816 Localized osteoporosis [Lequesne]: Secondary | ICD-10-CM | POA: Insufficient documentation

## 2019-09-03 ENCOUNTER — Other Ambulatory Visit: Payer: Self-pay | Admitting: Family Medicine

## 2019-09-03 DIAGNOSIS — Z8249 Family history of ischemic heart disease and other diseases of the circulatory system: Secondary | ICD-10-CM

## 2019-09-03 DIAGNOSIS — I25118 Atherosclerotic heart disease of native coronary artery with other forms of angina pectoris: Secondary | ICD-10-CM

## 2019-09-03 NOTE — Telephone Encounter (Signed)
Requested Prescriptions  Pending Prescriptions Disp Refills   OZEMPIC, 0.25 OR 0.5 MG/DOSE, 2 MG/1.5ML SOPN [Pharmacy Med Name: OZEMPIC 0.25-0.5 MG/DOSE PEN] 1.5 mL 2    Sig: INJECT 0.1875 MLS (0.25 MG TOTAL) INTO THE SKIN ONCE A WEEK. FOR FIRST 4 WEEKS. THEN INCREASE DOSE TO 0.5MG  WEEKLY     Endocrinology:  Diabetes - GLP-1 Receptor Agonists Passed - 09/03/2019  4:58 AM      Passed - HBA1C is between 0 and 7.9 and within 180 days    Hgb A1c MFr Bld  Date Value Ref Range Status  03/17/2019 5.0 <5.7 % of total Hgb Final    Comment:    For the purpose of screening for the presence of diabetes: . <5.7%       Consistent with the absence of diabetes 5.7-6.4%    Consistent with increased risk for diabetes             (prediabetes) > or =6.5%  Consistent with diabetes . This assay result is consistent with a decreased risk of diabetes. . Currently, no consensus exists regarding use of hemoglobin A1c for diagnosis of diabetes in children. . According to American Diabetes Association (ADA) guidelines, hemoglobin A1c <7.0% represents optimal control in non-pregnant diabetic patients. Different metrics may apply to specific patient populations.  Standards of Medical Care in Diabetes(ADA). Renella Cunas - Valid encounter within last 6 months    Recent Outpatient Visits          1 month ago Venous insufficiency of left lower extremity   [place], DO   2 months ago Atherosclerosis of native coronary artery of native heart with stable angina pectoris [facility])   [place], DO   3 months ago Right leg pain   [place], DO   3 months ago Left hand pain   [place], FNP   5 months ago Annual physical exam   [facility] Olin Hauser, DO      Future Appointments            In 2 weeks [place],  Devonne Doughty, [facility], [place]   In 1 month [place], Kathlene November, MD [facility], [place]

## 2019-09-08 ENCOUNTER — Telehealth (INDEPENDENT_AMBULATORY_CARE_PROVIDER_SITE_OTHER): Payer: 59 | Admitting: Psychiatry

## 2019-09-08 ENCOUNTER — Encounter: Payer: Self-pay | Admitting: Psychiatry

## 2019-09-08 ENCOUNTER — Other Ambulatory Visit: Payer: Self-pay

## 2019-09-08 DIAGNOSIS — F4310 Post-traumatic stress disorder, unspecified: Secondary | ICD-10-CM | POA: Diagnosis not present

## 2019-09-08 DIAGNOSIS — G4700 Insomnia, unspecified: Secondary | ICD-10-CM | POA: Diagnosis not present

## 2019-09-08 DIAGNOSIS — F3176 Bipolar disorder, in full remission, most recent episode depressed: Secondary | ICD-10-CM | POA: Diagnosis not present

## 2019-09-08 DIAGNOSIS — F410 Panic disorder [episodic paroxysmal anxiety] without agoraphobia: Secondary | ICD-10-CM

## 2019-09-08 MED ORDER — TRAZODONE HCL 100 MG PO TABS: 100.0000 mg | ORAL_TABLET | Freq: Every day | ORAL | 2 refills | Status: DC

## 2019-09-08 MED ORDER — ARIPIPRAZOLE 5 MG PO TABS: 5.0000 mg | ORAL_TABLET | Freq: Every day | ORAL | 2 refills | Status: DC

## 2019-09-08 NOTE — Progress Notes (Signed)
Provider Location : [facility] Patient Location : Home  Participants: Patient , Provider  Virtual Visit via Video Note  I connected with Ashley Fields on 09/08/19 at 10:00 AM EDT by a video enabled telemedicine application and verified that I am speaking with the correct person using two identifiers.   I discussed the limitations of evaluation and management by telemedicine and the availability of in person appointments. The patient expressed understanding and agreed to proceed.     I discussed the assessment and treatment plan with the patient. The patient was provided an opportunity to ask questions and all were answered. The patient agreed with the plan and demonstrated an understanding of the instructions.   The patient was advised to call back or seek an in-person evaluation if the symptoms worsen or if the condition fails to improve as anticipated.    [place] MD OP Progress Note  09/08/2019 11:24 AM [name]  MRN:  [id number]  Chief Complaint:  Chief Complaint    Follow-up     HPI: Ashley Fields is a 64 year old Caucasian female on disability, lives in [place], married, has a history of bipolar disorder, PTSD, chronic pain, migraine headaches, hypoglycemic episodes was evaluated by telemedicine today.  Patient today reports she is currently struggling with sleep issues.  She reports she has been waking up early in the morning and is unable to go back to sleep.  She does feel unrested when she wakes up in the morning.  She reports she is interested in a dosage increase of her trazodone.  Patient reports otherwise she is doing okay.  She denies any mood lability.  Patient denies any suicidality, homicidality or perceptual disturbances.  Patient reports she was recently started on phentermine for weight loss and also was started on ozempic.  Patient  otherwise denies new changes with her medications.  Patient reports she got both COVID-19 vaccines and tolerated it well.  She denies  any other concerns today.    Visit Diagnosis:    ICD-10-CM   1. Bipolar disorder, in full remission, most recent episode depressed (HCC)  F31.76 ARIPiprazole (ABILIFY) 5 MG tablet  2. PTSD (post-traumatic stress disorder)  F43.10 traZODone (DESYREL) 100 MG tablet  3. Panic attacks  F41.0   4. Insomnia, unspecified type  G47.00 traZODone (DESYREL) 100 MG tablet    Past Psychiatric History: I have reviewed past psychiatric history from my progress note on 05/15/2018.  Past trials of Prozac, Wellbutrin, Lexapro, Cymbalta, Rexulti  Past Medical History:  Past Medical History:  Diagnosis Date  . Allergy   . Anemia   . Anxiety   . Arthritis    Yes  . Blood transfusion without reported diagnosis 1977   Transfusions from miscarriage & hemorrhaging  . Chronic kidney disease   . Depression   . Disp fx of cuboid bone of right foot, init for clos fx 01/01/2017  . GERD (gastroesophageal reflux disease)   . Headache   . Hepatitis C   . Hyperlipidemia   . Neuromuscular disorder ([place]) 1997   Falling to much was an eye-opener  . Osteoporosis   . Thyroid disease   . Urine incontinence     Past Surgical History:  Procedure Laterality Date  . 5 miscarriages     1977-1985, Blood transfusion s/p miscarriage 1977  . ABDOMINAL HYSTERECTOMY  1987   Total  . APPENDECTOMY  12/2008  . AUGMENTATION MAMMAPLASTY Bilateral 2015   Bilat  . AUGMENTATION MAMMAPLASTY Bilateral 2018   implants redone w/ placement  of implants under muscle  . breast lift bilateral, implants  68/34/1962   bilateral, silicon naturel  . BREAST REDUCTION SURGERY Bilateral 1997  . CESAREAN SECTION  07/27/1981   Placenta Previa  . CHOLECYSTECTOMY  03/2004   Lap surgery  . COSMETIC SURGERY     Breast implants w/ lift, tummy tuck, upper & lower Blepharop  . EYE SURGERY  2017   Cataract surgery & lasik  . GASTRIC BYPASS  2005   Laparoscopic  . IVC FILTER INSERTION  12/2003   TrapEase Vena Cava Filter  . LUMBAR  LAMINECTOMY  06/2006   L4-L5 (spinal fusion)  . mini tummy tuck  05/07/2016   Bilateral bra/back roll lift skin removal  . neck surgery C3-C7  02/10/2015   ACDF  . REDUCTION MAMMAPLASTY Bilateral 1997  . SHOULDER ACROMIOPLASTY Left 03/27/2013   w/ labral debridement  . SMALL INTESTINE SURGERY  12/24/2003   Lap Gastric Bypass  . SPINAL CORD STIMULATOR IMPLANT  11/2010   removed 05/2011  . SPINE SURGERY  2008 2017   Fusion L4-L5, ACDF C4-C7    Family Psychiatric History: I have reviewed family psychiatric history from my progress note on 05/15/2018  Family History:  Family History  Problem Relation Age of Onset  . COPD Mother   . Lung cancer Mother   . Diabetes Mother   . Heart disease Mother   . Stroke Mother   . Heart attack Mother   . Arthritis Mother   . Cancer Mother   . Hypertension Mother   . Obesity Mother   . Varicose Veins Mother   . Heart disease Father   . Heart attack Father 34  . Early death Father   . Depression Sister        x 5 sisters  . COPD Sister   . Hypertension Sister   . Heart disease Brother        x 3 brothers  . Diabetes Brother   . Colon polyps Sister   . Hearing loss Sister   . Heart attack Sister   . Heart attack Brother   . Arthritis Sister   . Varicose Veins Sister   . Arthritis Brother   . Heart disease Brother   . Hypertension Brother   . Cancer Maternal Grandfather   . Stroke Maternal Grandfather   . COPD Sister   . Obesity Sister   . Depression Sister   . Depression Sister   . Heart disease Sister   . Hypertension Sister   . Diabetes Brother   . Heart disease Brother   . Hypertension Brother   . Obesity Brother   . Varicose Veins Brother   . Heart disease Brother   . Hypertension Brother   . Obesity Brother   . Hypertension Sister   . Obesity Sister   . Miscarriages / Stillbirths Maternal Aunt   . Miscarriages / Stillbirths Paternal Aunt   . Breast cancer Neg Hx     Social History: I have reviewed social history  from my progress note on 05/15/2018 Social History   Socioeconomic History  . Marital status: Married    Spouse name: Not on file  . Number of children: 1  . Years of education: [employer]  . Highest education level: Some college, no degree  Occupational History  . Not on file  Tobacco Use  . Smoking status: Former Smoker    Packs/day: 0.25    Years: 10.00    Pack years: 2.50  Types: Cigarettes    Quit date: 11/29/1995    Years since quitting: 23.7  . Smokeless tobacco: Former Systems developer  . Tobacco comment: Quite 1997, didnt smoke hardly at all when I was smoking.  Vaping Use  . Vaping Use: Never used  Substance and Sexual Activity  . Alcohol use: No  . Drug use: No  . Sexual activity: Yes    Birth control/protection: Condom, Post-menopausal, Surgical    Comment: Total Hysterectomy condoms  Other Topics Concern  . 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 Difficulty of Paying Living Expenses: Not on file  Food Insecurity:   . Worried About Charity fundraiser in the Last Year: Not on file  . Ran Out of Food in the Last Year: Not on file  Transportation Needs:   . Lack of Transportation (Medical): Not on file  . Lack of Transportation (Non-Medical): Not on file  Physical Activity:   . Days of Exercise per Week: Not on file  . Minutes of Exercise per Session: Not on file  Stress:   . Feeling of Stress : Not on file  Social Connections:   . Frequency of Communication with Friends and Family: Not on file  . Frequency of Social Gatherings with Friends and Family: Not on file  . Attends Religious Services: Not on file  . Active Member of Clubs or Organizations: Not on file  . Attends Archivist Meetings: Not on file  . Marital Status: Not on file    Allergies:  Allergies  Allergen Reactions  .  Flagyl [Metronidazole] Anaphylaxis  . Cymbalta [Duloxetine Hcl]   . Tape Rash    Metabolic Disorder Labs: Lab Results  Component Value Date   HGBA1C 5.0 03/17/2019   MPG 97 03/17/2019   MPG 103 08/11/2018   No results found for: PROLACTIN Lab Results  Component Value Date   CHOL 142 03/17/2019   TRIG 101 03/17/2019   HDL 71 03/17/2019   CHOLHDL 2.0 03/17/2019   LDLCALC 53 03/17/2019   LDLCALC 44 08/11/2018   Lab Results  Component Value Date   TSH 0.86 08/11/2018   TSH 1.88 07/30/2017    Therapeutic Level Labs: No results found for: LITHIUM No results found for: VALPROATE No components found for:  CBMZ  Current Medications: Current Outpatient Medications  Medication Sig Dispense Refill  . phentermine (ADIPEX-P) 37.5 MG tablet Take by mouth.    Marland Kitchen albuterol (VENTOLIN HFA) 108 (90 Base) MCG/ACT inhaler Inhale 2 puffs into the lungs every 4 (four) hours as needed for wheezing or shortness of breath (cough). 8 g 2  . ARIPiprazole (ABILIFY) 5 MG tablet Take 1 tablet (5 mg total) by mouth daily. 30 tablet 2  . Biotin 10000 MCG TABS Take 10,000 mcg by mouth 1 day or 1 dose.    . butalbital-acetaminophen-caffeine (FIORICET) 50-325-40 MG tablet Take 1 tablet by mouth 2 (two) times daily as needed for headache.    . [employer] ASPIRIN LOW DOSE 81 MG EC tablet TAKE 1 TABLET BY MOUTH EVERY DAY 90 tablet 3  . cyanocobalamin (,VITAMIN B-12,) 1000 MCG/ML injection Inject 1,000 mcg into the muscle every 30 (thirty) days.    Marland Kitchen estradiol (ESTRACE) 1 MG tablet Take 1 tablet (1 mg total) by mouth daily. 90 tablet 1  . etodolac (LODINE) 500  MG tablet TAKE 1 TABLET BY MOUTH TWICE A DAY 180 tablet 1  . ezetimibe (ZETIA) 10 MG tablet TAKE 1 TABLET BY MOUTH EVERY DAY 90 tablet 1  . ferrous sulfate 325 (65 FE) MG tablet ferrous sulfate 325 mg (65 mg iron) tablet  TAKE 1 TABLET BY MOUTH DAILY    . FERROUS SULFATE-FOLIC ACID PO Take 1 tablet by mouth 2 (two) times daily.     . fluticasone (FLONASE) 50  MCG/ACT nasal spray SPRAY 2 SPRAYS INTO EACH NOSTRIL EVERY DAY 48 mL 3  . furosemide (LASIX) 20 MG tablet TAKE 1 TABLET (20 MG TOTAL) BY MOUTH DAILY AS NEEDED FOR FLUID OR EDEMA. 90 tablet 2  . gabapentin (NEURONTIN) 300 MG capsule Take 1 capsule (300 mg total) by mouth 3 (three) times daily. 270 capsule 3  . Magnesium 500 MG CAPS Once a day    . Melatonin 10 MG CAPS Take 2 capsules by mouth at bedtime.     . Multiple Vitamin (MULTIVITAMIN WITH MINERALS) TABS tablet Take 1 tablet by mouth daily.    Marland Kitchen omeprazole (PRILOSEC) 40 MG capsule TAKE 1 CAPSULE BY MOUTH EVERY DAY 90 capsule 3  . ondansetron (ZOFRAN ODT) 4 MG disintegrating tablet Take 1 tablet (4 mg total) by mouth every 8 (eight) hours as needed for nausea or vomiting. 30 tablet 2  . oxyCODONE (OXY IR/ROXICODONE) 5 MG immediate release tablet Take 5 mg by mouth every 6 (six) hours as needed.     Marland Kitchen OZEMPIC, 0.25 OR 0.5 MG/DOSE, 2 MG/1.5ML SOPN INJECT 0.1875 MLS (0.25 MG TOTAL) INTO THE SKIN ONCE A WEEK. FOR FIRST 4 WEEKS. THEN INCREASE DOSE TO 0.5MG  WEEKLY 1.5 mL 0  . propranolol (INDERAL) 10 MG tablet Take 1 tablet (10 mg total) by mouth 3 (three) times daily as needed. Severe anxiety attacks (Patient taking differently: Take 20 mg by mouth 3 (three) times daily as needed. Severe anxiety attacks pt taking 20 mg now) 270 tablet 1  . rosuvastatin (CRESTOR) 40 MG tablet TAKE 1 TABLET BY MOUTH EVERY DAY 30 tablet 4  . tiZANidine (ZANAFLEX) 4 MG tablet Take 1 tablet (4 mg total) by mouth 3 (three) times daily. (Patient taking differently: Take 4 mg by mouth 4 (four) times daily. ) 90 tablet 1  . traZODone (DESYREL) 100 MG tablet Take 1 tablet (100 mg total) by mouth at bedtime. 30 tablet 2  . venlafaxine XR (EFFEXOR-XR) 75 MG 24 hr capsule TAKE 1 CAPSULE (75 MG TOTAL) BY MOUTH DAILY WITH BREAKFAST. 90 capsule 1  . XIIDRA 5 % SOLN INSTILL 1 DROP INTO BOTH EYES TWICE A DAY     No current facility-administered medications for this visit.      Musculoskeletal: Strength & Muscle Tone: UTA Gait & Station: normal Patient leans: N/A  Psychiatric Specialty Exam: Review of Systems  Psychiatric/Behavioral: Positive for sleep disturbance. Negative for agitation, behavioral problems, confusion, decreased concentration, dysphoric mood, hallucinations, self-injury and suicidal ideas. The patient is not nervous/anxious and is not hyperactive.   All other systems reviewed and are negative.   There were no vitals taken for this visit.There is no height or weight on file to calculate BMI.  General Appearance: Casual  Eye Contact:  Fair  Speech:  Normal Rate  Volume:  Normal  Mood:  Euthymic  Affect:  Congruent  Thought Process:  Goal Directed and Descriptions of Associations: Intact  Orientation:  Full (Time, Place, and Person)  Thought Content: Logical   Suicidal Thoughts:  No  Homicidal Thoughts:  No  Memory:  Immediate;   Fair Recent;   Fair Remote;   Fair  Judgement:  Fair  Insight:  Fair  Psychomotor Activity:  Normal  Concentration:  Concentration: Fair and Attention Span: Fair  Recall:  [employer] of Knowledge: Fair  Language: Fair  Akathisia:  No  Handed:  Right  AIMS (if indicated): UTA  Assets:  Communication Skills Desire for Improvement Housing Intimacy Social Support Talents/Skills Transportation Vocational/Educational  ADL's:  Intact  Cognition: WNL  Sleep:  Poor   Screenings: GAD-7     Office Visit from 03/17/2019 in [facility] Office Visit from 11/20/2018 in [facility] Office Visit from 08/18/2018 in [facility] Office Visit from 07/22/2018 in [facility] Office Visit from 03/13/2018 in [facility]  Total GAD-7 Score 2 4 9 8 5     PHQ2-9     Office Visit from 06/18/2019 in [facility] Office Visit from 03/17/2019 in [facility] Office Visit from 12/16/2018 in [facility] Office  Visit from 11/20/2018 in [facility] Office Visit from 09/09/2018 in [place]  PHQ-2 Total Score 0 0 0 2 0  PHQ-9 Total Score -- 2 -- 6 3       Assessment and Plan: Ashley Fields is a 64 year old Caucasian female on disability, married, lives in [place], has a history of PTSD, bipolar disorder, panic attacks, migraine headaches, chronic pain was evaluated by telemedicine today.  Patient is biologically predisposed given her family history as well as medical problems.  Patient with psychosocial stressors of multiple health issues, is currently struggling with sleep.  Plan as noted below.  Plan Bipolar disorder in remission Abilify 5 mg p.o. daily Venlafaxine extended release 75 mg p.o. daily with breakfast.   PTSD-stable Venlafaxine as prescribed   Insomnia unspecified-unstable Increase trazodone to 100 mg p.o. nightly Melatonin 10 mg p.o. daily  Panic attacks-stable Propranolol 20 mg p.o. 3 times daily as needed for severe anxiety attacks only.  Discussed with patient to monitor her sleep closely and if she continues to struggle with sleep she may have to stop taking phentermine.  She will have a discussion with her provider who is prescribing it.  Follow-up in clinic in 4 to 6 weeks or sooner if needed.  I have spent atleast 20 minutes face to face with patient today. More than 50 % of the time was spent for preparing to see the patient ( e.g., review of test, records ), ordering medications and test ,psychoeducation and supportive psychotherapy and care coordination,as well as documenting clinical information in electronic health record. This note was generated in part or whole with voice recognition software. Voice recognition is usually quite accurate but there are transcription errors that can and very often do occur. I apologize for any typographical errors that were not detected and corrected.       Ursula Alert, MD 09/08/2019, 11:24 AM

## 2019-09-09 DIAGNOSIS — M542 Cervicalgia: Secondary | ICD-10-CM | POA: Diagnosis not present

## 2019-09-09 DIAGNOSIS — M25552 Pain in left hip: Secondary | ICD-10-CM | POA: Diagnosis not present

## 2019-09-09 DIAGNOSIS — Z79891 Long term (current) use of opiate analgesic: Secondary | ICD-10-CM | POA: Diagnosis not present

## 2019-09-09 DIAGNOSIS — Z79899 Other long term (current) drug therapy: Secondary | ICD-10-CM | POA: Diagnosis not present

## 2019-09-09 DIAGNOSIS — M47896 Other spondylosis, lumbar region: Secondary | ICD-10-CM | POA: Diagnosis not present

## 2019-09-09 DIAGNOSIS — M533 Sacrococcygeal disorders, not elsewhere classified: Secondary | ICD-10-CM | POA: Diagnosis not present

## 2019-09-09 DIAGNOSIS — M25562 Pain in left knee: Secondary | ICD-10-CM | POA: Diagnosis not present

## 2019-09-09 DIAGNOSIS — M479 Spondylosis, unspecified: Secondary | ICD-10-CM | POA: Diagnosis not present

## 2019-09-09 DIAGNOSIS — M545 Low back pain: Secondary | ICD-10-CM | POA: Diagnosis not present

## 2019-09-09 DIAGNOSIS — G894 Chronic pain syndrome: Secondary | ICD-10-CM | POA: Diagnosis not present

## 2019-09-09 DIAGNOSIS — M48061 Spinal stenosis, lumbar region without neurogenic claudication: Secondary | ICD-10-CM | POA: Diagnosis not present

## 2019-09-10 DIAGNOSIS — E669 Obesity, unspecified: Secondary | ICD-10-CM | POA: Diagnosis not present

## 2019-09-21 ENCOUNTER — Encounter: Payer: Self-pay | Admitting: Family Medicine

## 2019-09-21 ENCOUNTER — Ambulatory Visit (INDEPENDENT_AMBULATORY_CARE_PROVIDER_SITE_OTHER): Payer: 59 | Admitting: Family Medicine

## 2019-09-21 ENCOUNTER — Other Ambulatory Visit: Payer: Self-pay

## 2019-09-21 DIAGNOSIS — Z9884 Bariatric surgery status: Secondary | ICD-10-CM | POA: Diagnosis not present

## 2019-09-21 DIAGNOSIS — M5441 Lumbago with sciatica, right side: Secondary | ICD-10-CM

## 2019-09-21 DIAGNOSIS — E782 Mixed hyperlipidemia: Secondary | ICD-10-CM | POA: Diagnosis not present

## 2019-09-21 DIAGNOSIS — M816 Localized osteoporosis [Lequesne]: Secondary | ICD-10-CM | POA: Diagnosis not present

## 2019-09-21 DIAGNOSIS — E559 Vitamin D deficiency, unspecified: Secondary | ICD-10-CM | POA: Diagnosis not present

## 2019-09-21 DIAGNOSIS — G894 Chronic pain syndrome: Secondary | ICD-10-CM | POA: Diagnosis not present

## 2019-09-21 DIAGNOSIS — E538 Deficiency of other specified B group vitamins: Secondary | ICD-10-CM

## 2019-09-21 DIAGNOSIS — G8929 Other chronic pain: Secondary | ICD-10-CM | POA: Diagnosis not present

## 2019-09-21 DIAGNOSIS — M5442 Lumbago with sciatica, left side: Secondary | ICD-10-CM | POA: Diagnosis not present

## 2019-09-21 MED ORDER — ALENDRONATE SODIUM 70 MG PO TABS: 70.0000 mg | ORAL_TABLET | ORAL | 11 refills | Status: DC

## 2019-09-21 MED ORDER — GABAPENTIN 600 MG PO TABS: 600.0000 mg | ORAL_TABLET | Freq: Three times a day (TID) | ORAL | 3 refills | Status: DC

## 2019-09-21 NOTE — Assessment & Plan Note (Signed)
Followed by Pain Management Will adjust Gabapentin as requested from 300 TID up to 600 TID, gradually increase dose, notify us if need new order with instructions

## 2019-09-21 NOTE — Assessment & Plan Note (Signed)
Significant improved weight loss with 7 lbs in 2 months On ozempic GLP1 therapy Dose increase to Ozempic 1mg  weekly when ready, still has some pens left on 0.5 dose Contact me and I can re order for 1mg  when ready Encourage lifestyle diet exercise regimen still

## 2019-09-21 NOTE — Assessment & Plan Note (Signed)
Controlled cholesterol on statin and lifestyletherapy - this is preventative Last lipid panel 2020 Prior hollenhorst plaque, followed by Cardiology  Plan: 1. Continue current meds - Rosuvastatin 40mg  + Zetia 10mg  daily 2. Continue ASA 81mg  for primary ASCVD risk reduction 3. Encourage improved lifestyle - low carb/cholesterol, reduce portion size, continue improving regular exercise  Check lipids today  Asked her to f/u with Cardiology discuss if needs Zetia still

## 2019-09-21 NOTE — Progress Notes (Signed)
Subjective:    Patient ID: Ashley Fields, female    DOB: 23-Oct-1955, 64 y.o.   MRN: [id number]  Ashley Fields is a 64 y.o. female presenting on 09/21/2019 for Osteoporosis   HPI   Return tomorrow for labs.  Vitamin D / B12 History of gastric bypass On Vitamin D OTC and B12 injection, requesting lab check vitamin levels  Joint Pain On Gabapentin 300mg  TID, says followed for chronic pain and asks about dose increase needs new order.  Morbid Obesity BMI >35 HLD CAD Fam history CAD Chronic problem with weight gain. Now on ozempic for past 3 months at dose 0.5mg  Weight trend down from 201 lbs at onset Ozempic, then down to 189 lbs now on Ozempic 0.5mg  weekly, she has about 6-7 weeks of medicine left, will notify us when running low for dose increase  She has history of prior significant morbid obesity and s/p gastric bypass surgery in 2005. She had done well overall but still has had some issues with continued weight gain over the years. Now recent had worsening. She is on variety of other medications for her mood currently and followed by Dr Shea Evans [facility].  - For lifestyle, she continues to limit portions / calories, reduce starch/fats in diet  Past meds tried Contrave in past. Phentermine, and most recently [place] with some wt loss 5 lbs in 1 month. in addition she has tried weight management programs in past [place], Nutrisystem,Atkins Diet - No history of Pre-Diabetes or DIabetes  Osteoporosis Last DEXA 08/2019 shows L femur T-2.6 She is on calcium vitamin D, asking for doses. Due for lab vitamin D Never on med for OP. She is interested.  Health Maintenance: UTD Mammogram 08/2019 UTD Flu vaccine 9/4  UTD DEXA done 08/2019, next 2 years.  Depression screen [facility] 2/9 09/21/2019 06/18/2019 03/17/2019  Decreased Interest 0 0 0  Down, Depressed, Hopeless 0 0 0  PHQ - 2 Score 0 0 0  Altered sleeping 0 - 1  Tired, decreased energy 0 - 1  Change  in appetite 0 - 0  Feeling bad or failure about yourself  0 - 0  Trouble concentrating 0 - 0  Moving slowly or fidgety/restless 0 - 0  Suicidal thoughts 0 - 0  PHQ-9 Score 0 - 2  Difficult doing work/chores Not difficult at all - Somewhat difficult  Some recent data might be hidden    Social History   Tobacco Use  . Smoking status: Former Smoker    Packs/day: 0.25    Years: 10.00    Pack years: 2.50    Types: Cigarettes    Quit date: 11/29/1995    Years since quitting: 23.8  . Smokeless tobacco: Former Systems developer  . Tobacco comment: Quite 1997, didnt smoke hardly at all when I was smoking.  Vaping Use  . Vaping Use: Never used  Substance Use Topics  . Alcohol use: No  . Drug use: No    Review of Systems Per HPI unless specifically indicated above     Objective:    BP 110/65   Pulse 85   Temp (!) 97.3 F (36.3 C) (Temporal)   Resp 16   Ht 5\' 1"  (1.549 m)   Wt 189 lb (85.7 kg)   SpO2 100%   BMI 35.71 kg/m   Wt Readings from Last 3 Encounters:  09/21/19 189 lb (85.7 kg)  07/24/19 195 lb 9.6 oz (88.7 kg)  07/21/19 196 lb (88.9 kg)  Physical Exam Vitals and nursing note reviewed.  Constitutional:      General: She is not in acute distress.    Appearance: She is well-developed. She is obese. She is not diaphoretic.     Comments: Well-appearing, comfortable, cooperative  HENT:     Head: Normocephalic and atraumatic.  Eyes:     General:        Right eye: No discharge.        Left eye: No discharge.     Conjunctiva/sclera: Conjunctivae normal.  Neck:     Thyroid: No thyromegaly.  Cardiovascular:     Rate and Rhythm: Normal rate and regular rhythm.     Heart sounds: Normal heart sounds. No murmur heard.   Pulmonary:     Effort: Pulmonary effort is normal. No respiratory distress.     Breath sounds: Normal breath sounds. No wheezing or rales.  Musculoskeletal:        General: Normal range of motion.     Cervical back: Normal range of motion and neck supple.       Right lower leg: No edema.     Left lower leg: No edema.  Lymphadenopathy:     Cervical: No cervical adenopathy.  Skin:    General: Skin is warm and dry.     Findings: No erythema or rash.  Neurological:     Mental Status: She is alert and oriented to person, place, and time.  Psychiatric:        Behavior: Behavior normal.     Comments: Well groomed, good eye contact, normal speech and thoughts    I have personally reviewed the radiology report from [place] 08/24/19   Narrative & Impression  EXAM: DUAL X-RAY ABSORPTIOMETRY (DXA) FOR BONE MINERAL DENSITY  IMPRESSION: Your patient Jeanni Allshouse completed a BMD test on 08/24/2019 using the [employer] (software version: 14.10) manufactured by [employer]. The following summarizes the results of our evaluation. Technologist: [facility] PATIENT BIOGRAPHICAL: Name: Zyria, Fiscus Patient ID: [id number] Birth Date: June 08, 1955 Height: 61.0 in. Gender: Female Exam Date: 08/24/2019 Weight: 190.6 lbs. Indications: Caucasian, History of Spinal Surgery, Hysterectomy, Oophorectomy Bilateral, Postmenopausal Fractures: Treatments: E[place]ace, Vitamin D DENSITOMETRY RESULTS: Site         Region     Measured Date Measured Age WHO Classification Young Adult T-score BMD         %Change vs. Previous Significant Change (*) DualFemur Neck Left 08/24/2019 64.3 Osteoporosis -2.6 0.672 g/cm2  Left Forearm Radius 33% 08/24/2019 63.3 Normal -0.6 0.822 g/cm2 ASSESSMENT: The BMD measured at Femur Neck Left is 0.672 g/cm2 with a T-score of -2.6. This patient is considered osteoporotic according to [facility]) criteria. Lumbar spine was not utilized due to surgical hardware. The scan quality is good.  World Pharmacologist [facility]) criteria for post-menopausal, Caucasian Women: Normal:                   T-score at or above -1 SD Osteopenia/low bone mass: T-score between -1 and -2.5 SD Osteoporosis:              T-score at or below -2.5 SD  RECOMMENDATIONS: 1. All patients should optimize calcium and vitamin D intake. 2. Consider FDA-approved medical therapies in postmenopausal women and men aged 91 years and older, based on the following: a. A hip or vertebral(clinical or morphometric) fracture b. T-score < -2.5 at the femoral neck or spine after appropriate evaluation to exclude secondary causes c. Low bone  mass (T-score between -1.0 and -2.5 at the femoral neck or spine) and a 10-year probability of a hip fracture > 3% or a 10-year probability of a major osteoporosis-related fracture > 20% based on the [place]-adapted WHO algorithm 3. Clinician judgment and/or patient preferences may indicate treatment for people with 10-year fracture probabilities above or below these levels FOLLOW-UP: People with diagnosed cases of osteoporosis or at high risk for fracture should have regular bone mineral density tests. For patients eligible for Medicare, routine testing is allowed once every 2 years. The testing frequency can be increased to one year for patients who have rapidly progressing disease, those who are receiving or discontinuing medical therapy to restore bone mass, or have additional risk factors.  I have reviewed this report, and agree with the above findings.  [facility]   Electronically Signed   By: Lowella Grip III M.D.   On: 08/24/2019 09:19     Results for orders placed or performed in visit on 07/24/19  Iron and TIBC  Result Value Ref Range   Iron 94 28 - 170 ug/dL   TIBC 458 (H) 250 - 450 ug/dL   Saturation Ratios 21 10.4 - 31.8 %   UIBC 364 ug/dL  Ferritin  Result Value Ref Range   Ferritin 20 11 - 307 ng/mL  Vitamin B12  Result Value Ref Range   Vitamin B-12 631 180 - 914 pg/mL  CBC with Differential  Result Value Ref Range   WBC 3.4 (L) 4.0 - 10.5 K/uL   RBC 4.52 3.87 - 5.11 MIL/uL   Hemoglobin 13.6 12.0 - 15.0 g/dL   HCT 40.4 36 - 46 %     MCV 89.4 80.0 - 100.0 fL   MCH 30.1 26.0 - 34.0 pg   MCHC 33.7 30.0 - 36.0 g/dL   RDW 12.4 11.5 - 15.5 %   Platelets 139 (L) 150 - 400 K/uL   nRBC 0.0 0.0 - 0.2 %   Neutrophils Relative % 60 %   Neutro Abs 2.0 1.7 - 7.7 K/uL   Lymphocytes Relative 30 %   Lymphs Abs 1.0 0.7 - 4.0 K/uL   Monocytes Relative 6 %   Monocytes Absolute 0.2 0 - 1 K/uL   Eosinophils Relative 3 %   Eosinophils Absolute 0.1 0 - 0 K/uL   Basophils Relative 1 %   Basophils Absolute 0.0 0 - 0 K/uL   Immature Granulocytes 0 %   Abs Immature Granulocytes 0.01 0.00 - 0.07 K/uL      Assessment & Plan:   Problem List Items Addressed This Visit    S/P gastric bypass   Relevant Orders   COMPLETE METABOLIC PANEL WITH GFR   Morbid obesity (HCC) - Primary    Significant improved weight loss with 7 lbs in 2 months On ozempic GLP1 therapy Dose increase to Ozempic 1mg  weekly when ready, still has some pens left on 0.5 dose Contact me and I can re order for 1mg  when ready Encourage lifestyle diet exercise regimen still      Relevant Orders   COMPLETE METABOLIC PANEL WITH GFR   Mixed hyperlipidemia    Controlled cholesterol on statin and lifestyletherapy - this is preventative Last lipid panel 2020 Prior hollenhorst plaque, followed by Cardiology  Plan: 1. Continue current meds - Rosuvastatin 40mg  + Zetia 10mg  daily 2. Continue ASA 81mg  for primary ASCVD risk reduction 3. Encourage improved lifestyle - low carb/cholesterol, reduce portion size, continue improving regular exercise  Check lipids today  Asked her to f/u with Cardiology discuss if needs Zetia still      Relevant Orders   Lipid panel   Localized osteoporosis without current pathological fracture    OP, L femur T-2.6 No fractures No prior meds Last DEXA 08/2019  START Bisphosphonate oral with Alendronate 70mg  weekly, new order sent, reviewed benefit side effect risk, potential osteonecrosis jaw and other concerns, will trial for 2 years,  repeat DEXA in 08/2021      Relevant Medications   alendronate (FOSAMAX) 70 MG tablet   Other Relevant Orders   COMPLETE METABOLIC PANEL WITH GFR   Chronic pain syndrome (Chronic)    Followed by Pain Management Will adjust Gabapentin as requested from 300 TID up to 600 TID, gradually increase dose, notify us if need new order with instructions      Relevant Medications   gabapentin (NEURONTIN) 600 MG tablet   Chronic Low back pain (Primary Area of Pain) (Bilateral)  (R>L) (Chronic)   Relevant Medications   gabapentin (NEURONTIN) 600 MG tablet    Other Visit Diagnoses    Vitamin D deficiency       Relevant Orders   VITAMIN D 25 Hydroxy (Vit-D Deficiency, Fractures)   Vitamin B12 deficiency       Relevant Orders   Vitamin B12      Orders Placed This Encounter  Procedures  . VITAMIN D 25 Hydroxy (Vit-D Deficiency, Fractures)  . COMPLETE METABOLIC PANEL WITH GFR  . Vitamin B12  . Lipid panel    Order Specific Question:   Has the patient fasted?    Answer:   Yes     Meds ordered this encounter  Medications  . gabapentin (NEURONTIN) 600 MG tablet    Sig: Take 1 tablet (600 mg total) by mouth 3 (three) times daily.    Dispense:  90 tablet    Refill:  3    Dose increase  . alendronate (FOSAMAX) 70 MG tablet    Sig: Take 1 tablet (70 mg total) by mouth every 7 (seven) days. Take with a full glass of water on an empty stomach.    Dispense:  4 tablet    Refill:  11      Follow up plan: Return in about 3 months (around 12/21/2019) for 3 month follow-up Weight.Nobie Putnam, DO [facility] 09/21/2019, 8:37 AM

## 2019-09-21 NOTE — Assessment & Plan Note (Signed)
OP, L femur T-2.6 No fractures No prior meds Last DEXA 08/2019  START Bisphosphonate oral with Alendronate 70mg  weekly, new order sent, reviewed benefit side effect risk, potential osteonecrosis jaw and other concerns, will trial for 2 years, repeat DEXA in 08/2021

## 2019-09-21 NOTE — Patient Instructions (Addendum)
Thank you for coming to the office today.  Keep on Ozempic 0.5mg  weekly for now, if you have 6-7 weeks left on the current dose, use most of those, and then about 1-2 weeks away from running out, send mychart message for dose increase up to 1mg  weekly.  Will order new Ozempic 1mg  weekly pen.  Checking labs tomorrow, Vitamins chemistry  Can recommend Calcium in addition to Vitamin D, will see levels.  Start Alendronate/Fosamax 70mg  weekly, see instructions, up to 2-5 years for osteoporosis  INcrease Gabapentin from 300 to 600mg , gradually increase dose.  Ask Dr Rockey Situ about the Zetia dose, if you still need this one, in addition to the Rosuvastatin.   Please schedule a Follow-up Appointment to: Return in about 3 months (around 12/21/2019) for 3 month follow-up Weight..  If you have any other questions or concerns, please feel free to call the office or send a message through [facility]. You may also schedule an earlier appointment if necessary.  Additionally, you may be receiving a survey about your experience at our office within a few days to 1 week by e-mail or mail. We value your feedback.  Nobie Putnam, DO [facility]

## 2019-09-22 DIAGNOSIS — E559 Vitamin D deficiency, unspecified: Secondary | ICD-10-CM | POA: Diagnosis not present

## 2019-09-22 DIAGNOSIS — M816 Localized osteoporosis [Lequesne]: Secondary | ICD-10-CM | POA: Diagnosis not present

## 2019-09-22 DIAGNOSIS — E538 Deficiency of other specified B group vitamins: Secondary | ICD-10-CM | POA: Diagnosis not present

## 2019-09-22 DIAGNOSIS — Z9884 Bariatric surgery status: Secondary | ICD-10-CM | POA: Diagnosis not present

## 2019-09-22 LAB — COMPLETE METABOLIC PANEL WITH GFR
AG Ratio: 1.9 (calc) (ref 1.0–2.5)
ALT: 30 U/L — ABNORMAL HIGH (ref 6–29)
AST: 34 U/L (ref 10–35)
Albumin: 4.4 g/dL (ref 3.6–5.1)
Alkaline phosphatase (APISO): 46 U/L (ref 37–153)
BUN: 13 mg/dL (ref 7–25)
CO2: 29 mmol/L (ref 20–32)
Calcium: 9.3 mg/dL (ref 8.6–10.4)
Chloride: 106 mmol/L (ref 98–110)
Creat: 0.89 mg/dL (ref 0.50–0.99)
GFR, Est African American: 80 mL/min/{1.73_m2} (ref >=60)
GFR, Est Non African American: 69 mL/min/{1.73_m2} (ref >=60)
Globulin: 2.3 g/dL (calc) (ref 1.9–3.7)
Glucose, Bld: 94 mg/dL (ref 65–99)
Potassium: 4.9 mmol/L (ref 3.5–5.3)
Sodium: 142 mmol/L (ref 135–146)
Total Bilirubin: 0.9 mg/dL (ref 0.2–1.2)
Total Protein: 6.7 g/dL (ref 6.1–8.1)

## 2019-09-22 LAB — LIPID PANEL
Cholesterol: 145 mg/dL (ref <200)
HDL: 88 mg/dL (ref >=50)
LDL Cholesterol (Calc): 42 mg/dL (calc)
Non-HDL Cholesterol (Calc): 57 mg/dL (calc) (ref <130)
Total CHOL/HDL Ratio: 1.6 (calc) (ref <5.0)
Triglycerides: 72 mg/dL (ref <150)

## 2019-09-22 LAB — VITAMIN B12: Vitamin B-12: 694 pg/mL (ref 200–1100)

## 2019-09-22 LAB — VITAMIN D 25 HYDROXY (VIT D DEFICIENCY, FRACTURES): Vit D, 25-Hydroxy: 55 ng/mL (ref 30–100)

## 2019-10-02 ENCOUNTER — Other Ambulatory Visit: Payer: Self-pay | Admitting: Psychiatry

## 2019-10-02 DIAGNOSIS — F4310 Post-traumatic stress disorder, unspecified: Secondary | ICD-10-CM

## 2019-10-02 DIAGNOSIS — G4700 Insomnia, unspecified: Secondary | ICD-10-CM

## 2019-10-02 DIAGNOSIS — F3176 Bipolar disorder, in full remission, most recent episode depressed: Secondary | ICD-10-CM

## 2019-10-05 ENCOUNTER — Other Ambulatory Visit: Payer: Self-pay | Admitting: Family Medicine

## 2019-10-05 DIAGNOSIS — I25118 Atherosclerotic heart disease of native coronary artery with other forms of angina pectoris: Secondary | ICD-10-CM

## 2019-10-05 DIAGNOSIS — Z8249 Family history of ischemic heart disease and other diseases of the circulatory system: Secondary | ICD-10-CM

## 2019-10-05 NOTE — Telephone Encounter (Signed)
Requested medication (s) are due for refill today: Yes  Requested medication (s) are on the active medication list: Yes  Last refill:  09/03/19  Future visit scheduled: No  Notes to clinic:  Unable to refill per protocol due to the sig of increasing the dose. New Rx will need to be sent with the increase of 0.5 mg weekly as the new Sig.     Requested Prescriptions  Pending Prescriptions Disp Refills   OZEMPIC, 0.25 OR 0.5 MG/DOSE, 2 MG/1.5ML SOPN [Pharmacy Med Name: OZEMPIC 0.25-0.5 MG/DOSE PEN]      Sig: INJECT 0.1875 MLS (0.25 MG TOTAL) INTO THE SKIN ONCE A WEEK. FOR FIRST 4 WEEKS. THEN INCREASE DOSE TO 0.5MG  WEEKLY      Endocrinology:  Diabetes - GLP-1 Receptor Agonists Failed - 10/05/2019  3:53 PM      Failed - HBA1C is between 0 and 7.9 and within 180 days    Hgb A1c MFr Bld  Date Value Ref Range Status  03/17/2019 5.0 <5.7 % of total Hgb Final    Comment:    For the purpose of screening for the presence of diabetes: . <5.7%       Consistent with the absence of diabetes 5.7-6.4%    Consistent with increased risk for diabetes             (prediabetes) > or =6.5%  Consistent with diabetes . This assay result is consistent with a decreased risk of diabetes. . Currently, no consensus exists regarding use of hemoglobin A1c for diagnosis of diabetes in children. . According to American Diabetes Association (ADA) guidelines, hemoglobin A1c <7.0% represents optimal control in non-pregnant diabetic patients. Different metrics may apply to specific patient populations.  Standards of Medical Care in Diabetes(ADA). Renella Cunas - Valid encounter within last 6 months    Recent Outpatient Visits           2 weeks ago Morbid obesity [facility])   [place], DO   2 months ago Venous insufficiency of left lower extremity   [place], DO   3 months ago Atherosclerosis of native coronary  artery of native heart with stable angina pectoris [facility])   [place], DO   4 months ago Right leg pain   [place], DO   4 months ago Left hand pain   [place], FNP       Future Appointments             In 2 weeks Gollan, Kathlene November, MD [facility], [facility]   In 2 months [place], Devonne Doughty, [facility], [facility]

## 2019-10-06 ENCOUNTER — Telehealth: Payer: Self-pay | Admitting: Family Medicine

## 2019-10-06 NOTE — Telephone Encounter (Signed)
I called the patient to schedule virtual AWV with Nicke[place].  She was driving, and the call dropped twice.  Will try again later.

## 2019-10-08 DIAGNOSIS — Z6833 Body mass index (BMI) 33.0-33.9, adult: Secondary | ICD-10-CM | POA: Diagnosis not present

## 2019-10-08 DIAGNOSIS — E669 Obesity, unspecified: Secondary | ICD-10-CM | POA: Diagnosis not present

## 2019-10-12 ENCOUNTER — Other Ambulatory Visit: Payer: Self-pay | Admitting: Family Medicine

## 2019-10-12 DIAGNOSIS — K219 Gastro-esophageal reflux disease without esophagitis: Secondary | ICD-10-CM

## 2019-10-19 NOTE — Progress Notes (Signed)
Date:  10/20/2019   ID:  Ashley Fields, DOB May 25, 1955, MRN [id number]  Patient Location:  [place] [id number]   Provider location:   [facility], [place] office  PCP:  Olin Hauser, DO  Cardiologist:  Patsy Baltimore   Chief Complaint  Patient presents with   office visit    12 month F/U-Patient would like to discuss discontinuing zetia; Meds verbally reviewed with patient.     History of Present Illness:    Ashley Fields is a 64 y.o. 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,  Vascular disease,  Coronary calcification , calcium score 2400 Who presents for follow-up of her coronary artery disease  No anginal sx activeon the farm Many of her family with CAD,  Chronic back pain back surgery  Oct 21st 2019  Active [place] the farm, [place] acres With animals , 12 cows, 2 goats 3 dogs 17 chickens 6 K steps a day Walks dogs  EKG personally reviewed by myself on todays visit NSR rate 87 no ST and T wave changes  Other past medical history reviewed Stress test 06/13/2017 Pharmacological myocardial perfusion imaging study with no significant  ischemia Normal wall motion, EF estimated at 83% Low risk scan  Remote smoking history for at least 15 years  CT chest  calcium score of 2400  aortic atherosclerosis noted "Coronary arteries: Marked LM and 3 vessel coronary calcification" Ascending Aorta: Calcific aortic atherosclerosis normal diameter 3.4 cm  minimal carotid disease bilaterally less than 39% bilaterally  Family history loss of her sister recently, due to cardiac arrest father died 46 MI Sister 55 died cardiac arrest   Prior CV studies:   The following studies were reviewed today:   Past Medical History:  Diagnosis Date   Allergy    Anemia    Anxiety     Arthritis    Yes   Blood transfusion without reported diagnosis 1977   Transfusions from miscarriage & hemorrhaging   Chronic kidney disease    Depression    Disp fx of cuboid bone of right foot, init for clos fx 01/01/2017   GERD (gastroesophageal reflux disease)    Headache    Hepatitis C    Hyperlipidemia    Neuromuscular disorder ([place]) 1997   Falling to much was an eye-opener   Osteoporosis    Thyroid disease    Urine incontinence    Past Surgical History:  Procedure Laterality Date   5 miscarriages     1977-1985, Blood transfusion s/p miscarriage [place]   Total   APPENDECTOMY  12/2008   AUGMENTATION MAMMAPLASTY Bilateral 2015   Bilat   AUGMENTATION MAMMAPLASTY Bilateral 2018   implants redone w/ placement of implants under muscle   breast lift bilateral, implants  33/54/5625   bilateral, silicon naturel   BREAST REDUCTION SURGERY Bilateral 1997   CESAREAN SECTION  07/27/1981   Placenta Previa   CHOLECYSTECTOMY  03/2004   Lap surgery   COSMETIC SURGERY     Breast implants w/ lift, tummy tuck, upper & lower Blepharop   EYE SURGERY  2017   Cataract surgery & lasik   GASTRIC BYPASS  2005   Laparoscopic   IVC FILTER INSERTION  12/2003   TrapEase Vena Cava Filter   LUMBAR LAMINECTOMY  06/2006   L4-L5 (spinal fusion)   mini tummy tuck  05/07/2016   Bilateral bra/back roll lift skin removal   neck surgery C3-C7  02/10/2015   ACDF   REDUCTION MAMMAPLASTY Bilateral 1997   SHOULDER ACROMIOPLASTY Left 03/27/2013   w/ labral debridement   SMALL INTESTINE SURGERY  12/24/2003   Lap Gastric Bypass   SPINAL CORD STIMULATOR IMPLANT  11/2010   removed 05/2011   SPINE SURGERY  2008 2017   Fusion L4-L5, ACDF C4-C7      Allergies:   Flagyl [metronidazole], Cymbalta [duloxetine hcl], and Tape   Social History   Tobacco Use   Smoking status: Former Smoker    Packs/day: 0.25    Years: 10.00    Pack years:  2.50    Types: Cigarettes    Quit date: 11/29/1995    Years since quitting: 23.9   Smokeless tobacco: Former Systems developer   Tobacco comment: Quite 1997, didnt smoke hardly at all when I was smoking.  Vaping Use   Vaping Use: Never used  Substance Use Topics   Alcohol use: No   Drug use: No     Current Outpatient Medications on File Prior to Visit  Medication Sig Dispense Refill   albuterol (VENTOLIN HFA) 108 (90 Base) MCG/ACT inhaler Inhale 2 puffs into the lungs every 4 (four) hours as needed for wheezing or shortness of breath (cough). 8 g 2   alendronate (FOSAMAX) 70 MG tablet Take 1 tablet (70 mg total) by mouth every 7 (seven) days. Take with a full glass of water on an empty stomach. 4 tablet 11   ARIPiprazole (ABILIFY) 5 MG tablet TAKE 1 TABLET BY MOUTH EVERY DAY 90 tablet 1   Biotin 10000 MCG TABS Take 10,000 mcg by mouth 1 day or 1 dose.     butalbital-acetaminophen-caffeine (FIORICET) 50-325-40 MG tablet Take 1 tablet by mouth 2 (two) times daily as needed for headache.     [facility] ASPIRIN LOW DOSE 81 MG EC tablet TAKE 1 TABLET BY MOUTH EVERY DAY 90 tablet 3   cyanocobalamin (,VITAMIN B-12,) 1000 MCG/ML injection Inject 1,000 mcg into the muscle every 30 (thirty) days.     estradiol (ESTRACE) 1 MG tablet Take 1 tablet (1 mg total) by mouth daily. 90 tablet 1   etodolac (LODINE) 500 MG tablet TAKE 1 TABLET BY MOUTH TWICE A DAY 180 tablet 1   ezetimibe (ZETIA) 10 MG tablet TAKE 1 TABLET BY MOUTH EVERY DAY 90 tablet 1   ferrous sulfate 325 (65 FE) MG tablet ferrous sulfate 325 mg (65 mg iron) tablet  TAKE 1 TABLET BY MOUTH DAILY     FERROUS SULFATE-FOLIC ACID PO Take 1 tablet by mouth 2 (two) times daily.      fluticasone (FLONASE) 50 MCG/ACT nasal spray SPRAY 2 SPRAYS INTO EACH NOSTRIL EVERY DAY 48 mL 3   furosemide (LASIX) 20 MG tablet TAKE 1 TABLET (20 MG TOTAL) BY MOUTH DAILY AS NEEDED FOR FLUID OR EDEMA. 90 tablet 2   gabapentin (NEURONTIN) 600 MG tablet Take 1  tablet (600 mg total) by mouth 3 (three) times daily. 90 tablet 3   Magnesium 500 MG CAPS Once a day     Melatonin 10 MG CAPS Take 2 capsules by mouth at bedtime.      Multiple Vitamin (MULTIVITAMIN WITH MINERALS) TABS tablet Take 1 tablet by mouth daily.     omeprazole (PRILOSEC) 40 MG capsule TAKE 1 CAPSULE BY MOUTH EVERY DAY 90 capsule 3  ondansetron (ZOFRAN ODT) 4 MG disintegrating tablet Take 1 tablet (4 mg total) by mouth every 8 (eight) hours as needed for nausea or vomiting. 30 tablet 2   oxyCODONE (OXY IR/ROXICODONE) 5 MG immediate release tablet Take 5 mg by mouth every 6 (six) hours as needed.      OZEMPIC, 0.25 OR 0.5 MG/DOSE, 2 MG/1.5ML SOPN Inject 0.5 mg into the skin once a week. 1.5 mL 2   propranolol (INDERAL) 10 MG tablet Take 1 tablet (10 mg total) by mouth 3 (three) times daily as needed. Severe anxiety attacks (Patient taking differently: Take 20 mg by mouth 3 (three) times daily as needed. Severe anxiety attacks pt taking 20 mg now) 270 tablet 1   rosuvastatin (CRESTOR) 40 MG tablet TAKE 1 TABLET BY MOUTH EVERY DAY 30 tablet 4   tiZANidine (ZANAFLEX) 4 MG tablet Take 4 mg by mouth 4 (four) times daily.     traZODone (DESYREL) 100 MG tablet TAKE 1 TABLET BY MOUTH EVERYDAY AT BEDTIME 90 tablet 1   venlafaxine XR (EFFEXOR-XR) 75 MG 24 hr capsule TAKE 1 CAPSULE (75 MG TOTAL) BY MOUTH DAILY WITH BREAKFAST. 90 capsule 1   XIIDRA 5 % SOLN INSTILL 1 DROP INTO BOTH EYES TWICE A DAY     tiZANidine (ZANAFLEX) 4 MG tablet Take 1 tablet (4 mg total) by mouth 3 (three) times daily. (Patient taking differently: Take 4 mg by mouth 4 (four) times daily. ) 90 tablet 1   No current facility-administered medications on file prior to visit.     Family Hx: The patient's family history includes Arthritis in her brother, mother, and sister; COPD in her mother, sister, and sister; Cancer in her maternal grandfather and mother; Colon polyps in her sister; Depression in her sister,  sister, and sister; Diabetes in her brother, brother, and mother; Early death in her father; Hearing loss in her sister; Heart attack in her brother, mother, and sister; Heart attack (age of onset: 49) in her father; Heart disease in her brother, brother, brother, brother, father, mother, and sister; Hypertension in her brother, brother, brother, mother, sister, sister, and sister; Lung cancer in her mother; Miscarriages / Stillbirths in her maternal aunt and paternal aunt; Obesity in her brother, brother, mother, sister, and sister; Stroke in her maternal grandfather and mother; Varicose Veins in her brother, mother, and sister. There is no history of Breast cancer.  ROS:   Please see the history of present illness.    Review of Systems  Constitutional: Negative.   HENT: Negative.   Respiratory: Negative.   Cardiovascular: Negative.   Gastrointestinal: Negative.   Musculoskeletal: Negative.   Neurological: Negative.   Psychiatric/Behavioral: Negative.   All other systems reviewed and are negative.    Labs/Other Tests and Data Reviewed:    Recent Labs: 07/24/2019: Hemoglobin 13.6; Platelets 139 09/22/2019: ALT 30; BUN 13; Creat 0.89; Potassium 4.9; Sodium 142   Recent Lipid Panel Lab Results  Component Value Date/Time   CHOL 145 09/22/2019 08:02 AM   TRIG 72 09/22/2019 08:02 AM   HDL 88 09/22/2019 08:02 AM   CHOLHDL 1.6 09/22/2019 08:02 AM   LDLCALC 42 09/22/2019 08:02 AM    Wt Readings from Last 3 Encounters:  09/21/19 189 lb (85.7 kg)  07/24/19 195 lb 9.6 oz (88.7 kg)  07/21/19 196 lb (88.9 kg)     Exam:    Vital Signs: Vital signs may also be detailed in the HPI BP 120/78 (BP Location: Left Arm, Patient Position: Sitting,  Cuff Size: Large)    Pulse 87    Ht 5\' 2"  (1.575 m)    Wt 189 lb 2 oz (85.8 kg)    SpO2 92%    BMI 34.59 kg/m   Constitutional:  oriented to person, place, and time. No distress.  HENT:  Head: Grossly normal Eyes:  no discharge. No scleral icterus.    Neck: No JVD, no carotid bruits  Cardiovascular: Regular rate and rhythm, no murmurs appreciated Pulmonary/Chest: Clear to auscultation bilaterally, no wheezes or rails Abdominal: Soft.  no distension.  no tenderness.  Musculoskeletal: Normal range of motion Neurological:  normal muscle tone. Coordination normal. No atrophy Skin: Skin warm and dry Psychiatric: normal affect, pleasant   ASSESSMENT & PLAN:    Problem List Items Addressed This Visit      Cardiology Problems   Mixed hyperlipidemia   Atherosclerosis of native coronary artery of native heart with stable angina pectoris (HCC) - Primary   Relevant Medications   tiZANidine (ZANAFLEX) 4 MG tablet     Other   Smoker   Shortness of breath    Other Visit Diagnoses    PAD (peripheral artery disease) (HCC)         Hollenhorst plaque, right eye -  Carotid with minimal bilateral disease significant aortic atherosclerosis  No more issues  CAD with chronic stable angina Calcium score 2400 on  CT scan Negative stress test Continue Crestor 40 with Zetia 10 Stable, results discussed, numbers at goal  Mixed hyperlipidemia Cholesterol is at goal on the current lipid regimen. No changes to the medications were made.  Smoker  Reports that she quit over 20 years ago, 1997 she quit    Total encounter time more than 25 minutes  Greater than 50% was spent in counseling and coordination of care with the patient   Signed, Ida Rogue, [facility] [facility] [id number], [place], [place] [address]

## 2019-10-20 ENCOUNTER — Other Ambulatory Visit: Payer: Self-pay | Admitting: Family Medicine

## 2019-10-20 ENCOUNTER — Other Ambulatory Visit: Payer: Self-pay

## 2019-10-20 ENCOUNTER — Encounter: Payer: Self-pay | Admitting: Cardiovascular Disease

## 2019-10-20 ENCOUNTER — Ambulatory Visit (INDEPENDENT_AMBULATORY_CARE_PROVIDER_SITE_OTHER): Payer: 59 | Admitting: Cardiovascular Disease

## 2019-10-20 VITALS — BP 120/78 | HR 87 | Ht 62.0 in | Wt 189.1 lb

## 2019-10-20 DIAGNOSIS — Z90710 Acquired absence of both cervix and uterus: Secondary | ICD-10-CM

## 2019-10-20 DIAGNOSIS — I25118 Atherosclerotic heart disease of native coronary artery with other forms of angina pectoris: Secondary | ICD-10-CM

## 2019-10-20 DIAGNOSIS — E782 Mixed hyperlipidemia: Secondary | ICD-10-CM

## 2019-10-20 DIAGNOSIS — I739 Peripheral vascular disease, unspecified: Secondary | ICD-10-CM

## 2019-10-20 DIAGNOSIS — F17200 Nicotine dependence, unspecified, uncomplicated: Secondary | ICD-10-CM | POA: Diagnosis not present

## 2019-10-20 DIAGNOSIS — R609 Edema, unspecified: Secondary | ICD-10-CM

## 2019-10-20 DIAGNOSIS — F410 Panic disorder [episodic paroxysmal anxiety] without agoraphobia: Secondary | ICD-10-CM

## 2019-10-20 DIAGNOSIS — R0602 Shortness of breath: Secondary | ICD-10-CM

## 2019-10-20 DIAGNOSIS — F4310 Post-traumatic stress disorder, unspecified: Secondary | ICD-10-CM

## 2019-10-20 DIAGNOSIS — F3176 Bipolar disorder, in full remission, most recent episode depressed: Secondary | ICD-10-CM

## 2019-10-20 MED ORDER — ROSUVASTATIN CALCIUM 40 MG PO TABS
40.0000 mg | ORAL_TABLET | Freq: Every day | ORAL | 3 refills | Status: DC
Start: 2019-10-20 — End: 2020-06-23

## 2019-10-20 MED ORDER — EZETIMIBE 10 MG PO TABS: 10.0000 mg | ORAL_TABLET | Freq: Every day | ORAL | 3 refills | Status: DC

## 2019-10-20 MED ORDER — FUROSEMIDE 20 MG PO TABS: 20.0000 mg | ORAL_TABLET | Freq: Every day | ORAL | 3 refills | Status: DC | PRN

## 2019-10-20 MED ORDER — ESTRADIOL 1 MG PO TABS: 1.0000 mg | ORAL_TABLET | Freq: Every day | ORAL | 0 refills | Status: DC

## 2019-10-20 NOTE — Patient Instructions (Signed)
Medication Instructions:  No changes  If you need a refill on your cardiac medications before your next appointment, please call your pharmacy.    Lab work: No new labs needed   If you have labs (blood work) drawn today and your tests are completely normal, you will receive your results only by: . MyChart Message (if you have MyChart) OR . A paper copy in the mail If you have any lab test that is abnormal or we need to change your treatment, we will call you to review the results.   Testing/Procedures: No new testing needed   Follow-Up: At [facility], you and your health needs are our priority.  As part of our continuing mission to provide you with exceptional heart care, we have created designated Provider Care Teams.  These Care Teams include your primary Cardiologist (physician) and Advanced Practice Providers (APPs -  Physician Assistants and Nurse Practitioners) who all work together to provide you with the care you need, when you need it.  . You will need a follow up appointment in 12 months  . Providers on your designated Care Team:   . Christopher Berge, NP . Ryan Dunn, PA-C . Jacquelyn Visser, PA-C  Any Other Special Instructions Will Be Listed Below (If Applicable).  COVID-19 Vaccine Information can be found at: [email or web address] For questions related to vaccine distribution or appointments, please email [email or web address] or call [phone].     

## 2019-10-20 NOTE — Telephone Encounter (Signed)
Copied from [place] [phone]. Topic: Quick Communication - Rx Refill/Question >> Oct 20, 2019 10:02 AM Yvette Rack wrote: Medication: estradiol (ESTRACE) 1 MG tablet  Has the patient contacted their pharmacy? yes   Preferred Pharmacy (with phone number or street name): [facility], [place]  Phone: [phone]  Fax: [phone]  Agent: Please be advised that RX refills may take up to 3 business days. We ask that you follow-up with your pharmacy.

## 2019-10-23 ENCOUNTER — Other Ambulatory Visit: Payer: Self-pay | Admitting: Psychiatry

## 2019-10-23 DIAGNOSIS — F4310 Post-traumatic stress disorder, unspecified: Secondary | ICD-10-CM

## 2019-10-23 DIAGNOSIS — F3176 Bipolar disorder, in full remission, most recent episode depressed: Secondary | ICD-10-CM

## 2019-10-23 DIAGNOSIS — F410 Panic disorder [episodic paroxysmal anxiety] without agoraphobia: Secondary | ICD-10-CM

## 2019-10-25 ENCOUNTER — Other Ambulatory Visit: Payer: Self-pay | Admitting: Psychiatry

## 2019-10-25 DIAGNOSIS — F4310 Post-traumatic stress disorder, unspecified: Secondary | ICD-10-CM

## 2019-10-25 DIAGNOSIS — F3176 Bipolar disorder, in full remission, most recent episode depressed: Secondary | ICD-10-CM

## 2019-10-25 DIAGNOSIS — F410 Panic disorder [episodic paroxysmal anxiety] without agoraphobia: Secondary | ICD-10-CM

## 2019-10-27 ENCOUNTER — Other Ambulatory Visit: Payer: Self-pay

## 2019-10-27 ENCOUNTER — Encounter: Payer: Self-pay | Admitting: Psychiatry

## 2019-10-27 ENCOUNTER — Telehealth (INDEPENDENT_AMBULATORY_CARE_PROVIDER_SITE_OTHER): Payer: 59 | Admitting: Psychiatry

## 2019-10-27 DIAGNOSIS — F410 Panic disorder [episodic paroxysmal anxiety] without agoraphobia: Secondary | ICD-10-CM

## 2019-10-27 DIAGNOSIS — G4700 Insomnia, unspecified: Secondary | ICD-10-CM | POA: Insufficient documentation

## 2019-10-27 DIAGNOSIS — G4701 Insomnia due to medical condition: Secondary | ICD-10-CM

## 2019-10-27 DIAGNOSIS — F4310 Post-traumatic stress disorder, unspecified: Secondary | ICD-10-CM

## 2019-10-27 DIAGNOSIS — F3161 Bipolar disorder, current episode mixed, mild: Secondary | ICD-10-CM

## 2019-10-27 DIAGNOSIS — R69 Illness, unspecified: Secondary | ICD-10-CM | POA: Diagnosis not present

## 2019-10-27 MED ORDER — ARIPIPRAZOLE 10 MG PO TABS: 10.0000 mg | ORAL_TABLET | Freq: Every day | ORAL | 0 refills | Status: DC

## 2019-10-27 MED ORDER — TRAZODONE HCL 100 MG PO TABS: ORAL_TABLET | ORAL | 1 refills | Status: DC

## 2019-10-27 NOTE — Progress Notes (Signed)
Provider Location : [facility] Patient Location : [place]  Participants: Patient , Provider  Virtual Visit via Video Note  I connected with Ashley Fields on 10/27/19 at 10:00 AM EDT by a video enabled telemedicine application and verified that I am speaking with the correct person using two identifiers.   I discussed the limitations of evaluation and management by telemedicine and the availability of in person appointments. The patient expressed understanding and agreed to proceed.     I discussed the assessment and treatment plan with the patient. The patient was provided an opportunity to ask questions and all were answered. The patient agreed with the plan and demonstrated an understanding of the instructions.   The patient was advised to call back or seek an in-person evaluation if the symptoms worsen or if the condition fails to improve as anticipated.  Video connection was lost at less than 50% of the duration of the visit, at which time the remainder of the visit was completed through audio only     [facility] MD OP Progress Note  10/27/2019 12:18 PM Kasen Sako  MRN:  [id number]  Chief Complaint:  Chief Complaint    Follow-up     HPI: [name] is a 64 year old Caucasian female, married, on disability, lives in [place], has a history of bipolar disorder, PTSD, chronic pain, migraine headaches, insomnia, was evaluated by telemedicine today.  Patient today reports she is currently struggling with mood swings, irritability, anxiety, sadness, sleep issues.  This has been getting worse since the past few days.  She reports she currently has psychosocial stressors of her sister and her sister's daughter who moved in with her.  She reports they were going through some personal problems which led to this.  She reports ever since then her mood symptoms are getting worse.  Patient reports she is compliant on medications.  Denies side effects.  She denies any suicidality , homicidality or  perceptual disturbances.  Patient is not interested in referral for CBT.   She denies any other concerns today.  Visit Diagnosis:    ICD-10-CM   1. Bipolar 1 disorder, mixed, mild (HCC)  F31.61 ARIPiprazole (ABILIFY) 10 MG tablet  2. PTSD (post-traumatic stress disorder)  F43.10 traZODone (DESYREL) 100 MG tablet  3. Panic attacks  F41.0   4. Insomnia due to medical condition  G47.01 traZODone (DESYREL) 100 MG tablet    Past Psychiatric History: I have reviewed past psychiatric history from my progress note on 05/15/2018.  Past trials of Prozac, Wellbutrin, Lexapro, Cymbalta, Rexulti  Past Medical History:  Past Medical History:  Diagnosis Date  . Allergy   . Anemia   . Anxiety   . Arthritis    Yes  . Blood transfusion without reported diagnosis 1977   Transfusions from miscarriage & hemorrhaging  . Chronic kidney disease   . Depression   . Disp fx of cuboid bone of right foot, init for clos fx 01/01/2017  . GERD (gastroesophageal reflux disease)   . Headache   . Hepatitis C   . Hyperlipidemia   . Neuromuscular disorder ([place]) 1997   Falling to much was an eye-opener  . Osteoporosis   . Thyroid disease   . Urine incontinence     Past Surgical History:  Procedure Laterality Date  . 5 miscarriages     1977-1985, Blood transfusion s/p miscarriage 1977  . ABDOMINAL HYSTERECTOMY  1987   Total  . APPENDECTOMY  12/2008  . AUGMENTATION MAMMAPLASTY Bilateral 2015   Bilat  .  AUGMENTATION MAMMAPLASTY Bilateral 2018   implants redone w/ placement of implants under muscle  . breast lift bilateral, implants  23/53/6144   bilateral, silicon naturel  . BREAST REDUCTION SURGERY Bilateral 1997  . CESAREAN SECTION  07/27/1981   Placenta Previa  . CHOLECYSTECTOMY  03/2004   Lap surgery  . COSMETIC SURGERY     Breast implants w/ lift, tummy tuck, upper & lower Blepharop  . EYE SURGERY  2017   Cataract surgery & lasik  . GASTRIC BYPASS  2005   Laparoscopic  . IVC FILTER INSERTION   12/2003   TrapEase Vena Cava Filter  . LUMBAR LAMINECTOMY  06/2006   L4-L5 (spinal fusion)  . mini tummy tuck  05/07/2016   Bilateral bra/back roll lift skin removal  . neck surgery C3-C7  02/10/2015   ACDF  . REDUCTION MAMMAPLASTY Bilateral 1997  . SHOULDER ACROMIOPLASTY Left 03/27/2013   w/ labral debridement  . SMALL INTESTINE SURGERY  12/24/2003   Lap Gastric Bypass  . SPINAL CORD STIMULATOR IMPLANT  11/2010   removed 05/2011  . SPINE SURGERY  2008 2017   Fusion L4-L5, ACDF C4-C7    Family Psychiatric History: I have reviewed family psychiatric history from my progress note on 05/15/2018  Family History:  Family History  Problem Relation Age of Onset  . COPD Mother   . Lung cancer Mother   . Diabetes Mother   . Heart disease Mother   . Stroke Mother   . Heart attack Mother   . Arthritis Mother   . Cancer Mother   . Hypertension Mother   . Obesity Mother   . Varicose Veins Mother   . Heart disease Father   . Heart attack Father 42  . Early death Father   . Depression Sister        x 5 sisters  . COPD Sister   . Hypertension Sister   . Heart disease Brother        x 3 brothers  . Diabetes Brother   . Colon polyps Sister   . Hearing loss Sister   . Heart attack Sister   . Heart attack Brother   . Arthritis Sister   . Varicose Veins Sister   . Arthritis Brother   . Heart disease Brother   . Hypertension Brother   . Cancer Maternal Grandfather   . Stroke Maternal Grandfather   . COPD Sister   . Obesity Sister   . Depression Sister   . Depression Sister   . Heart disease Sister   . Hypertension Sister   . Diabetes Brother   . Heart disease Brother   . Hypertension Brother   . Obesity Brother   . Varicose Veins Brother   . Heart disease Brother   . Hypertension Brother   . Obesity Brother   . Hypertension Sister   . Obesity Sister   . Miscarriages / Stillbirths Maternal Aunt   . Miscarriages / Stillbirths Paternal Aunt   . Breast cancer Neg Hx      Social History: Reviewed social history from my progress note on 05/15/2018 Social History   Socioeconomic History  . Marital status: Married    Spouse name: Not on file  . Number of children: 1  . Years of education: [employer]  . Highest education level: Some college, no degree  Occupational History  . Not on file  Tobacco Use  . Smoking status: Former Smoker    Packs/day: 0.25    Years: 10.00  Pack years: 2.50    Types: Cigarettes    Quit date: 11/29/1995    Years since quitting: 23.9  . Smokeless tobacco: Former Systems developer  . Tobacco comment: Quite 1997, didnt smoke hardly at all when I was smoking.  Vaping Use  . Vaping Use: Never used  Substance and Sexual Activity  . Alcohol use: No  . Drug use: No  . Sexual activity: Yes    Birth control/protection: Condom, Post-menopausal, Surgical    Comment: Total Hysterectomy condoms  Other Topics Concern  . 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 Difficulty of Paying Living Expenses: Not on file  Food Insecurity:   . Worried About Charity fundraiser in the Last Year: Not on file  . Ran Out of Food in the Last Year: Not on file  Transportation Needs:   . Lack of Transportation (Medical): Not on file  . Lack of Transportation (Non-Medical): Not on file  Physical Activity:   . Days of Exercise per Week: Not on file  . Minutes of Exercise per Session: Not on file  Stress:   . Feeling of Stress : Not on file  Social Connections:   . Frequency of Communication with Friends and Family: Not on file  . Frequency of Social Gatherings with Friends and Family: Not on file  . Attends Religious Services: Not on file  . Active Member of Clubs or Organizations: Not on file  . Attends Archivist Meetings: Not on file  . Marital Status: Not on file     Allergies:  Allergies  Allergen Reactions  . Flagyl [Metronidazole] Anaphylaxis  . Cymbalta [Duloxetine Hcl]   . Tape Rash    Metabolic Disorder Labs: Lab Results  Component Value Date   HGBA1C 5.0 03/17/2019   MPG 97 03/17/2019   MPG 103 08/11/2018   No results found for: PROLACTIN Lab Results  Component Value Date   CHOL 145 09/22/2019   TRIG 72 09/22/2019   HDL 88 09/22/2019   CHOLHDL 1.6 09/22/2019   LDLCALC 42 09/22/2019   LDLCALC 53 03/17/2019   Lab Results  Component Value Date   TSH 0.86 08/11/2018   TSH 1.88 07/30/2017    Therapeutic Level Labs: No results found for: LITHIUM No results found for: VALPROATE No components found for:  CBMZ  Current Medications: Current Outpatient Medications  Medication Sig Dispense Refill  . albuterol (VENTOLIN HFA) 108 (90 Base) MCG/ACT inhaler Inhale 2 puffs into the lungs every 4 (four) hours as needed for wheezing or shortness of breath (cough). 8 g 2  . alendronate (FOSAMAX) 70 MG tablet Take 1 tablet (70 mg total) by mouth every 7 (seven) days. Take with a full glass of water on an empty stomach. 4 tablet 11  . ARIPiprazole (ABILIFY) 10 MG tablet Take 1 tablet (10 mg total) by mouth daily. 90 tablet 0  . Biotin 10000 MCG TABS Take 10,000 mcg by mouth 1 day or 1 dose.    . butalbital-acetaminophen-caffeine (FIORICET) 50-325-40 MG tablet Take 1 tablet by mouth 2 (two) times daily as needed for headache.    . [employer] ASPIRIN LOW DOSE 81 MG EC tablet TAKE 1 TABLET BY MOUTH EVERY DAY 90 tablet 3  . cyanocobalamin (,VITAMIN B-12,) 1000 MCG/ML injection Inject 1,000 mcg into the muscle every 30 (thirty)  days.    . diclofenac Sodium (VOLTAREN) 1 % GEL Apply 2 g topically 4 (four) times daily.    Marland Kitchen estradiol (ESTRACE) 1 MG tablet Take 1 tablet (1 mg total) by mouth daily. 90 tablet 0  . etodolac (LODINE) 500 MG tablet TAKE 1 TABLET BY MOUTH TWICE A DAY 180 tablet 1  . ezetimibe (ZETIA) 10 MG tablet Take 1 tablet (10 mg total) by  mouth daily. 90 tablet 3  . ferrous sulfate 325 (65 FE) MG tablet ferrous sulfate 325 mg (65 mg iron) tablet  TAKE 1 TABLET BY MOUTH DAILY    . FERROUS SULFATE-FOLIC ACID PO Take 1 tablet by mouth 2 (two) times daily.     . fluticasone (FLONASE) 50 MCG/ACT nasal spray SPRAY 2 SPRAYS INTO EACH NOSTRIL EVERY DAY 48 mL 3  . furosemide (LASIX) 20 MG tablet Take 1 tablet (20 mg total) by mouth daily as needed for fluid or edema. 90 tablet 3  . gabapentin (NEURONTIN) 600 MG tablet Take 1 tablet (600 mg total) by mouth 3 (three) times daily. 90 tablet 3  . Magnesium 500 MG CAPS Once a day    . Melatonin 10 MG CAPS Take 2 capsules by mouth at bedtime.     . Multiple Vitamin (MULTIVITAMIN WITH MINERALS) TABS tablet Take 1 tablet by mouth daily.    Marland Kitchen omeprazole (PRILOSEC) 40 MG capsule TAKE 1 CAPSULE BY MOUTH EVERY DAY 90 capsule 3  . ondansetron (ZOFRAN ODT) 4 MG disintegrating tablet Take 1 tablet (4 mg total) by mouth every 8 (eight) hours as needed for nausea or vomiting. 30 tablet 2  . oxyCODONE (OXY IR/ROXICODONE) 5 MG immediate release tablet Take 5 mg by mouth every 6 (six) hours as needed.     Marland Kitchen OZEMPIC, 0.25 OR 0.5 MG/DOSE, 2 MG/1.5ML SOPN Inject 0.5 mg into the skin once a week. 1.5 mL 2  . propranolol (INDERAL) 10 MG tablet TAKE ONE TABLET BY MOUTH THREE TIMES DAILY AS NEEDED for severe anxiety attacks 270 tablet 0  . rosuvastatin (CRESTOR) 40 MG tablet Take 1 tablet (40 mg total) by mouth daily. 90 tablet 3  . tiZANidine (ZANAFLEX) 4 MG tablet Take 1 tablet (4 mg total) by mouth 3 (three) times daily. (Patient taking differently: Take 4 mg by mouth 4 (four) times daily. ) 90 tablet 1  . tiZANidine (ZANAFLEX) 4 MG tablet Take 4 mg by mouth 4 (four) times daily.    . traZODone (DESYREL) 100 MG tablet TAKE 1 TABLET BY MOUTH EVERYDAY AT BEDTIME 90 tablet 1  . venlafaxine XR (EFFEXOR-XR) 75 MG 24 hr capsule TAKE 1 CAPSULE (75 MG TOTAL) BY MOUTH DAILY WITH BREAKFAST. 90 capsule 1  . XIIDRA 5 % SOLN  INSTILL 1 DROP INTO BOTH EYES TWICE A DAY     No current facility-administered medications for this visit.     Musculoskeletal: Strength & Muscle Tone: UTA Gait & Station: normal Patient leans: N/A  Psychiatric Specialty Exam: Review of Systems  Psychiatric/Behavioral: Positive for sleep disturbance. The patient is nervous/anxious.        Mood swings  All other systems reviewed and are negative.   There were no vitals taken for this visit.There is no height or weight on file to calculate BMI.  General Appearance: Casual  Eye Contact:  Fair  Speech:  Clear and Coherent  Volume:  Normal  Mood:  Anxious, mood swings  Affect:  Congruent  Thought Process:  Goal Directed and Descriptions of Associations:  Intact  Orientation:  Full (Time, Place, and Person)  Thought Content: Logical   Suicidal Thoughts:  No  Homicidal Thoughts:  No  Memory:  Immediate;   Fair Recent;   Fair Remote;   Fair  Judgement:  Fair  Insight:  Fair  Psychomotor Activity:  Normal  Concentration:  Concentration: Fair and Attention Span: Fair  Recall:  [employer] of Knowledge: Fair  Language: Fair  Akathisia:  No  Handed:  Right  AIMS (if indicated): UTA  Assets:  Communication Skills Desire for Improvement Housing Social Support  ADL's:  Intact  Cognition: WNL  Sleep:  Restless   Screenings: GAD-7     Office Visit from 03/17/2019 in [facility] Office Visit from 11/20/2018 in [facility] Office Visit from 08/18/2018 in [facility] Office Visit from 07/22/2018 in [facility] Office Visit from 03/13/2018 in [facility]  Total GAD-7 Score 2 4 9 8 5     PHQ2-9     Office Visit from 09/21/2019 in [facility] Office Visit from 06/18/2019 in [facility] Office Visit from 03/17/2019 in [facility] Office Visit from 12/16/2018 in [facility] Office Visit from 11/20/2018 in  [place]  PHQ-2 Total Score 0 0 0 0 2  PHQ-9 Total Score 0 -- 2 -- 6       Assessment and Plan: Ashley Fields is a 64 year old Caucasian female on disability, married, lives in [place], has a history of PTSD, bipolar disorder, panic attacks, migraine headaches, chronic pain was evaluated by telemedicine today.  Patient is biologically predisposed given her family history, medical problems.  Patient with psychosocial stressors of recent situational stressors as noted above.  Patient will benefit from medication readjustment for her mood symptoms and sleep issues.  Plan as noted below.  Plan Bipolar disorder mixed mild-unstable Will increase Abilify to 10 mg p.o. daily Venlafaxine extended release 75 mg p.o. daily with breakfast Restart trazodone 100 mg p.o. nightly for sleep  PTSD-stable Venlafaxine as prescribed Continue trazodone 100 mg p.o. nightly. Melatonin 10 mg p.o. daily  Panic attacks-unstable She reports she wants to just restart propranolol at this time.  Propranolol 20 mg p.o. 3 times daily as needed for anxiety attacks Discussed referral for CBT, she will let writer know if she is interested.   Follow-up in clinic in 3 to 4 weeks or sooner if needed.  I have spent atleast -20 minutes face to face by video with patient today. More than 50 % of the time was spent for preparing to see the patient ( e.g., review of test, records ), ordering medications and test ,psychoeducation and supportive psychotherapy and care coordination,as well as documenting clinical information in electronic health record. This note was generated in part or whole with voice recognition software. Voice recognition is usually quite accurate but there are transcription errors that can and very often do occur. I apologize for any typographical errors that were not detected and corrected.      Ursula Alert, MD 10/27/2019, 12:18 PM

## 2019-10-29 ENCOUNTER — Other Ambulatory Visit: Payer: Self-pay | Admitting: Family Medicine

## 2019-10-29 DIAGNOSIS — K219 Gastro-esophageal reflux disease without esophagitis: Secondary | ICD-10-CM

## 2019-10-30 DIAGNOSIS — I25118 Atherosclerotic heart disease of native coronary artery with other forms of angina pectoris: Secondary | ICD-10-CM

## 2019-10-30 DIAGNOSIS — Z8249 Family history of ischemic heart disease and other diseases of the circulatory system: Secondary | ICD-10-CM

## 2019-10-30 MED ORDER — OZEMPIC (1 MG/DOSE) 4 MG/3ML ~~LOC~~ SOPN: 1.0000 mg | PEN_INJECTOR | SUBCUTANEOUS | 2 refills | Status: DC

## 2019-11-11 DIAGNOSIS — R69 Illness, unspecified: Secondary | ICD-10-CM | POA: Diagnosis not present

## 2019-11-13 DIAGNOSIS — M25552 Pain in left hip: Secondary | ICD-10-CM | POA: Diagnosis not present

## 2019-11-13 DIAGNOSIS — M542 Cervicalgia: Secondary | ICD-10-CM | POA: Diagnosis not present

## 2019-11-13 DIAGNOSIS — G894 Chronic pain syndrome: Secondary | ICD-10-CM | POA: Diagnosis not present

## 2019-11-13 DIAGNOSIS — Z79891 Long term (current) use of opiate analgesic: Secondary | ICD-10-CM | POA: Diagnosis not present

## 2019-11-13 DIAGNOSIS — M533 Sacrococcygeal disorders, not elsewhere classified: Secondary | ICD-10-CM | POA: Diagnosis not present

## 2019-11-13 DIAGNOSIS — M47896 Other spondylosis, lumbar region: Secondary | ICD-10-CM | POA: Diagnosis not present

## 2019-11-13 DIAGNOSIS — M48061 Spinal stenosis, lumbar region without neurogenic claudication: Secondary | ICD-10-CM | POA: Diagnosis not present

## 2019-11-13 DIAGNOSIS — M479 Spondylosis, unspecified: Secondary | ICD-10-CM | POA: Diagnosis not present

## 2019-11-13 DIAGNOSIS — Z79899 Other long term (current) drug therapy: Secondary | ICD-10-CM | POA: Diagnosis not present

## 2019-11-16 DIAGNOSIS — H43813 Vitreous degeneration, bilateral: Secondary | ICD-10-CM | POA: Diagnosis not present

## 2019-11-16 DIAGNOSIS — H43391 Other vitreous opacities, right eye: Secondary | ICD-10-CM | POA: Diagnosis not present

## 2019-11-16 DIAGNOSIS — H04123 Dry eye syndrome of bilateral lacrimal glands: Secondary | ICD-10-CM | POA: Diagnosis not present

## 2019-11-16 DIAGNOSIS — H26493 Other secondary cataract, bilateral: Secondary | ICD-10-CM | POA: Diagnosis not present

## 2019-11-16 DIAGNOSIS — H16223 Keratoconjunctivitis sicca, not specified as Sjogren's, bilateral: Secondary | ICD-10-CM | POA: Diagnosis not present

## 2019-11-18 DIAGNOSIS — E669 Obesity, unspecified: Secondary | ICD-10-CM | POA: Diagnosis not present

## 2019-11-24 ENCOUNTER — Other Ambulatory Visit: Payer: Self-pay

## 2019-11-24 ENCOUNTER — Encounter: Payer: Self-pay | Admitting: Psychiatry

## 2019-11-24 ENCOUNTER — Telehealth (INDEPENDENT_AMBULATORY_CARE_PROVIDER_SITE_OTHER): Payer: 59 | Admitting: Family Medicine

## 2019-11-24 ENCOUNTER — Telehealth (INDEPENDENT_AMBULATORY_CARE_PROVIDER_SITE_OTHER): Payer: Medicare HMO | Admitting: Psychiatry

## 2019-11-24 ENCOUNTER — Encounter: Payer: Self-pay | Admitting: Family Medicine

## 2019-11-24 DIAGNOSIS — H00021 Hordeolum internum right upper eyelid: Secondary | ICD-10-CM | POA: Diagnosis not present

## 2019-11-24 DIAGNOSIS — F4310 Post-traumatic stress disorder, unspecified: Secondary | ICD-10-CM | POA: Diagnosis not present

## 2019-11-24 DIAGNOSIS — F3161 Bipolar disorder, current episode mixed, mild: Secondary | ICD-10-CM | POA: Insufficient documentation

## 2019-11-24 DIAGNOSIS — G4701 Insomnia due to medical condition: Secondary | ICD-10-CM | POA: Diagnosis not present

## 2019-11-24 DIAGNOSIS — F3178 Bipolar disorder, in full remission, most recent episode mixed: Secondary | ICD-10-CM | POA: Diagnosis not present

## 2019-11-24 DIAGNOSIS — F410 Panic disorder [episodic paroxysmal anxiety] without agoraphobia: Secondary | ICD-10-CM

## 2019-11-24 DIAGNOSIS — R69 Illness, unspecified: Secondary | ICD-10-CM | POA: Diagnosis not present

## 2019-11-24 MED ORDER — POLYMYXIN B-TRIMETHOPRIM 10000-0.1 UNIT/ML-% OP SOLN: 1.0000 [drp] | OPHTHALMIC | 0 refills | Status: DC

## 2019-11-24 NOTE — Progress Notes (Signed)
Virtual Visit via Video Note  I connected with Ashley Fields on 11/24/19 at  4:00 PM EST by a video enabled telemedicine application and verified that I am speaking with the correct person using two identifiers.  Location Provider Location : [facility] Patient Location : Home  Participants: Patient , Provider    I discussed the limitations of evaluation and management by telemedicine and the availability of in person appointments. The patient expressed understanding and agreed to proceed.   I discussed the assessment and treatment plan with the patient. The patient was provided an opportunity to ask questions and all were answered. The patient agreed with the plan and demonstrated an understanding of the instructions.   The patient was advised to call back or seek an in-person evaluation if the symptoms worsen or if the condition fails to improve as anticipated.    [place] MD OP Progress Note  11/24/2019 4:17 PM Shunta Mclaurin  MRN:  [id number]  Chief Complaint:  Chief Complaint    Follow-up     HPI: Ashley Fields is a 64 year old Caucasian female, married, on disability, lives in [place], has a history of bipolar disorder, PTSD, chronic pain, migraine headaches, insomnia was evaluated by telemedicine today.  Patient today reports she is currently doing well with regards to her mood.  She reports she is getting along with her sister better than before.  She is making an effort to do so.  She reports that has tremendously helped.  She is currently on the higher dosage of Abilify which also seems to help.  She denies any side effects to this dosage increase.  She reports sleep is better.  She denies any suicidality, homicidality or perceptual disturbances.  She reports she developed a stye on her eyes and is currently on treatment for that.  She does not remember the name of the medication though.  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  She reports she looks forward to Thanksgiving holidays.  She is planning  to spend it with her family.  Patient denies any other concerns today.  Visit Diagnosis:    ICD-10-CM   1. Bipolar disorder, in full remission, most recent episode mixed ([place])  F31.78   2. PTSD (post-traumatic stress disorder)  F43.10   3. Panic attacks  F41.0   4. Insomnia due to medical condition  G47.01     Past Psychiatric History: I have reviewed past psychiatric history from my progress note on 05/15/2018.  Past trials of Prozac, Wellbutrin, Lexapro, Cymbalta, Rexulti  Past Medical History:  Past Medical History:  Diagnosis Date  . Allergy   . Anemia   . Anxiety   . Arthritis    Yes  . Blood transfusion without reported diagnosis 1977   Transfusions from miscarriage & hemorrhaging  . Chronic kidney disease   . Depression   . Disp fx of cuboid bone of right foot, init for clos fx 01/01/2017  . GERD (gastroesophageal reflux disease)   . Headache   . Hepatitis C   . Hyperlipidemia   . Neuromuscular disorder ([place]) 1997   Falling to much was an eye-opener  . Osteoporosis   . Thyroid disease   . Urine incontinence     Past Surgical History:  Procedure Laterality Date  . 5 miscarriages     1977-1985, Blood transfusion s/p miscarriage 1977  . ABDOMINAL HYSTERECTOMY  1987   Total  . APPENDECTOMY  12/2008  . AUGMENTATION MAMMAPLASTY Bilateral 2015   Bilat  . AUGMENTATION MAMMAPLASTY Bilateral 2018  implants redone w/ placement of implants under muscle  . breast lift bilateral, implants  16/10/9602   bilateral, silicon naturel  . BREAST REDUCTION SURGERY Bilateral 1997  . CESAREAN SECTION  07/27/1981   Placenta Previa  . CHOLECYSTECTOMY  03/2004   Lap surgery  . COSMETIC SURGERY     Breast implants w/ lift, tummy tuck, upper & lower Blepharop  . EYE SURGERY  2017   Cataract surgery & lasik  . GASTRIC BYPASS  2005   Laparoscopic  . IVC FILTER INSERTION  12/2003   TrapEase Vena Cava Filter  . LUMBAR LAMINECTOMY  06/2006   L4-L5 (spinal fusion)  . mini tummy  tuck  05/07/2016   Bilateral bra/back roll lift skin removal  . neck surgery C3-C7  02/10/2015   ACDF  . REDUCTION MAMMAPLASTY Bilateral 1997  . SHOULDER ACROMIOPLASTY Left 03/27/2013   w/ labral debridement  . SMALL INTESTINE SURGERY  12/24/2003   Lap Gastric Bypass  . SPINAL CORD STIMULATOR IMPLANT  11/2010   removed 05/2011  . SPINE SURGERY  2008 2017   Fusion L4-L5, ACDF C4-C7    Family Psychiatric History: I have reviewed family psychiatric history from my progress note on 05/15/2018  Family History:  Family History  Problem Relation Age of Onset  . COPD Mother   . Lung cancer Mother   . Diabetes Mother   . Heart disease Mother   . Stroke Mother   . Heart attack Mother   . Arthritis Mother   . Cancer Mother   . Hypertension Mother   . Obesity Mother   . Varicose Veins Mother   . Heart disease Father   . Heart attack Father 22  . Early death Father   . Depression Sister        x 5 sisters  . COPD Sister   . Hypertension Sister   . Heart disease Brother        x 3 brothers  . Diabetes Brother   . Colon polyps Sister   . Hearing loss Sister   . Heart attack Sister   . Heart attack Brother   . Arthritis Sister   . Varicose Veins Sister   . Arthritis Brother   . Heart disease Brother   . Hypertension Brother   . Cancer Maternal Grandfather   . Stroke Maternal Grandfather   . COPD Sister   . Obesity Sister   . Depression Sister   . Depression Sister   . Heart disease Sister   . Hypertension Sister   . Diabetes Brother   . Heart disease Brother   . Hypertension Brother   . Obesity Brother   . Varicose Veins Brother   . Heart disease Brother   . Hypertension Brother   . Obesity Brother   . Hypertension Sister   . Obesity Sister   . Miscarriages / Stillbirths Maternal Aunt   . Miscarriages / Stillbirths Paternal Aunt   . Breast cancer Neg Hx     Social History: Reviewed social history from my progress note on 05/15/2018 Social History   Socioeconomic  History  . Marital status: Married    Spouse name: Not on file  . Number of children: 1  . Years of education: [employer]  . Highest education level: Some college, no degree  Occupational History  . Not on file  Tobacco Use  . Smoking status: Former Smoker    Packs/day: 0.25    Years: 10.00    Pack years: 2.50  Types: Cigarettes    Quit date: 11/29/1995    Years since quitting: 24.0  . Smokeless tobacco: Former Systems developer  . Tobacco comment: Quite 1997, didnt smoke hardly at all when I was smoking.  Vaping Use  . Vaping Use: Never used  Substance and Sexual Activity  . Alcohol use: No  . Drug use: No  . Sexual activity: Yes    Birth control/protection: Condom, Post-menopausal, Surgical    Comment: Total Hysterectomy condoms  Other Topics Concern  . 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 Difficulty of Paying Living Expenses: Not on file  Food Insecurity:   . Worried About Charity fundraiser in the Last Year: Not on file  . Ran Out of Food in the Last Year: Not on file  Transportation Needs:   . Lack of Transportation (Medical): Not on file  . Lack of Transportation (Non-Medical): Not on file  Physical Activity:   . Days of Exercise per Week: Not on file  . Minutes of Exercise per Session: Not on file  Stress:   . Feeling of Stress : Not on file  Social Connections:   . Frequency of Communication with Friends and Family: Not on file  . Frequency of Social Gatherings with Friends and Family: Not on file  . Attends Religious Services: Not on file  . Active Member of Clubs or Organizations: Not on file  . Attends Archivist Meetings: Not on file  . Marital Status: Not on file    Allergies:  Allergies  Allergen Reactions  . Flagyl [Metronidazole] Anaphylaxis  . Cymbalta [Duloxetine Hcl]    . Tape Rash    Metabolic Disorder Labs: Lab Results  Component Value Date   HGBA1C 5.0 03/17/2019   MPG 97 03/17/2019   MPG 103 08/11/2018   No results found for: PROLACTIN Lab Results  Component Value Date   CHOL 145 09/22/2019   TRIG 72 09/22/2019   HDL 88 09/22/2019   CHOLHDL 1.6 09/22/2019   LDLCALC 42 09/22/2019   LDLCALC 53 03/17/2019   Lab Results  Component Value Date   TSH 0.86 08/11/2018   TSH 1.88 07/30/2017    Therapeutic Level Labs: No results found for: LITHIUM No results found for: VALPROATE No components found for:  CBMZ  Current Medications: Current Outpatient Medications  Medication Sig Dispense Refill  . albuterol (VENTOLIN HFA) 108 (90 Base) MCG/ACT inhaler Inhale 2 puffs into the lungs every 4 (four) hours as needed for wheezing or shortness of breath (cough). 8 g 2  . alendronate (FOSAMAX) 70 MG tablet Take 1 tablet (70 mg total) by mouth every 7 (seven) days. Take with a full glass of water on an empty stomach. 4 tablet 11  . ARIPiprazole (ABILIFY) 10 MG tablet Take 1 tablet (10 mg total) by mouth daily. 90 tablet 0  . butalbital-acetaminophen-caffeine (FIORICET) 50-325-40 MG tablet Take 1 tablet by mouth 2 (two) times daily as needed for headache.    . cloNIDine (CATAPRES - DOSED IN MG/24 HR) 0.1 mg/24hr patch SMARTSIG:Topical    . [employer] ASPIRIN LOW DOSE 81 MG EC tablet TAKE 1 TABLET BY MOUTH EVERY DAY 90 tablet 3  . cyanocobalamin (,VITAMIN B-12,) 1000 MCG/ML injection Inject 1,000 mcg into the muscle every 30 (thirty) days.    . diclofenac Sodium (VOLTAREN) 1 %  GEL Apply 2 g topically 4 (four) times daily.    Marland Kitchen estradiol (ESTRACE) 1 MG tablet Take 1 tablet (1 mg total) by mouth daily. 90 tablet 0  . etodolac (LODINE) 500 MG tablet TAKE 1 TABLET BY MOUTH TWICE A DAY 180 tablet 1  . ezetimibe (ZETIA) 10 MG tablet Take 1 tablet (10 mg total) by mouth daily. 90 tablet 3  . FERROUS SULFATE-FOLIC ACID PO Take 1 tablet by mouth 2 (two) times daily.      . fluticasone (FLONASE) 50 MCG/ACT nasal spray SPRAY 2 SPRAYS INTO EACH NOSTRIL EVERY DAY 48 mL 3  . furosemide (LASIX) 20 MG tablet Take 1 tablet (20 mg total) by mouth daily as needed for fluid or edema. 90 tablet 3  . gabapentin (NEURONTIN) 600 MG tablet Take 1 tablet (600 mg total) by mouth 3 (three) times daily. 90 tablet 3  . Melatonin 10 MG CAPS Take 2 capsules by mouth at bedtime.     . Multiple Vitamin (MULTIVITAMIN WITH MINERALS) TABS tablet Take 1 tablet by mouth daily.    Marland Kitchen omeprazole (PRILOSEC) 40 MG capsule TAKE 1 CAPSULE BY MOUTH EVERY DAY 90 capsule 2  . ondansetron (ZOFRAN ODT) 4 MG disintegrating tablet Take 1 tablet (4 mg total) by mouth every 8 (eight) hours as needed for nausea or vomiting. 30 tablet 2  . oxyCODONE (OXY IR/ROXICODONE) 5 MG immediate release tablet Take 5 mg by mouth every 6 (six) hours as needed.     Marland Kitchen OZEMPIC, 1 MG/DOSE, 4 MG/3ML SOPN Inject 1 mg into the skin once a week. 3 mL 2  . phentermine (ADIPEX-P) 37.5 MG tablet Take 37.5 mg by mouth at bedtime.    . propranolol (INDERAL) 10 MG tablet TAKE ONE TABLET BY MOUTH THREE TIMES DAILY AS NEEDED for severe anxiety attacks 270 tablet 0  . rosuvastatin (CRESTOR) 40 MG tablet Take 1 tablet (40 mg total) by mouth daily. 90 tablet 3  . tiZANidine (ZANAFLEX) 4 MG tablet Take 4 mg by mouth 4 (four) times daily.    . traMADol (ULTRAM) 50 MG tablet Take 50 mg by mouth 3 (three) times daily as needed.    . traZODone (DESYREL) 100 MG tablet TAKE 1 TABLET BY MOUTH EVERYDAY AT BEDTIME 90 tablet 1  . trimethoprim-polymyxin b (POLYTRIM) ophthalmic solution Place 1 drop into the right eye every 4 (four) hours. For 7 days 10 mL 0  . venlafaxine XR (EFFEXOR-XR) 75 MG 24 hr capsule TAKE 1 CAPSULE (75 MG TOTAL) BY MOUTH DAILY WITH BREAKFAST. 90 capsule 1  . XIIDRA 5 % SOLN INSTILL 1 DROP INTO BOTH EYES TWICE A DAY     No current facility-administered medications for this visit.     Musculoskeletal: Strength & Muscle Tone:  UTA Gait & Station: normal Patient leans: N/A  Psychiatric Specialty Exam: Review of Systems  Psychiatric/Behavioral: Negative for agitation, behavioral problems, confusion, hallucinations, self-injury, sleep disturbance and suicidal ideas. The patient is not nervous/anxious.   All other systems reviewed and are negative.   There were no vitals taken for this visit.There is no height or weight on file to calculate BMI.  General Appearance: Casual  Eye Contact:  Fair  Speech:  Normal Rate  Volume:  Normal  Mood:  Euthymic  Affect:  Congruent  Thought Process:  Goal Directed and Descriptions of Associations: Intact  Orientation:  Full (Time, Place, and Person)  Thought Content: Logical   Suicidal Thoughts:  No  Homicidal Thoughts:  No  Memory:  Immediate;   Fair Recent;   Fair Remote;   Fair  Judgement:  Fair  Insight:  Fair  Psychomotor Activity:  Normal  Concentration:  Concentration: Fair and Attention Span: Fair  Recall:  [employer] of Knowledge: Fair  Language: Fair  Akathisia:  No  Handed:  Right  AIMS (if indicated):UTA  Assets:  Communication Skills Desire for Improvement Housing Social Support  ADL's:  Intact  Cognition: WNL  Sleep:  Fair   Screenings: GAD-7     Office Visit from 03/17/2019 in [facility] Office Visit from 11/20/2018 in [facility] Office Visit from 08/18/2018 in [facility] Office Visit from 07/22/2018 in [facility] Office Visit from 03/13/2018 in [facility]  Total GAD-7 Score 2 4 9 8 5     PHQ2-9     Office Visit from 09/21/2019 in [facility] Office Visit from 06/18/2019 in [facility] Office Visit from 03/17/2019 in [facility] Office Visit from 12/16/2018 in [facility] Office Visit from 11/20/2018 in [facility]  PHQ-2 Total Score 0 0 0 0 2  PHQ-9 Total Score 0 -- 2 -- 6        Assessment and Plan: Allen Basista is a 64 year old Caucasian female on disability, married, lives in [place], has a history of PTSD, bipolar disorder, panic attacks, migraine headaches, chronic pain was evaluated by telemedicine today.  Patient is currently making progress on the current medication regimen.  Plan as noted below.  Plan Bipolar disorder mixed mild in remission. Abilify 10 mg p.o. daily Venlafaxine extended release 75 mg p.o. daily with breakfast Trazodone 100 mg p.o. nightly for sleep  PTSD-stable Venlafaxine as prescribed Trazodone 100 mg p.o. nightly Melatonin 10 mg p.o. daily  Panic attacks-stable Propranolol 20 mg p.o. 3 times daily as needed  Follow-up in clinic in 2 months or sooner if needed.  I have spent atleast 20 minutes face to face by video with patient today. More than 50 % of the time was spent for preparing to see the patient ( e.g., review of test, records ), ordering medications and test ,psychoeducation and supportive psychotherapy and care coordination,as well as documenting clinical information in electronic health record. This note was generated in part or whole with voice recognition software. Voice recognition is usually quite accurate but there are transcription errors that can and very often do occur. I apologize for any typographical errors that were not detected and corrected.      Ursula Alert, MD 11/25/2019, 8:28 AM

## 2019-11-24 NOTE — Progress Notes (Signed)
Subjective:    Patient ID: Ashley Fields, female    DOB: [birth year], 64 y.o.   MRN: [id number]  Ashley Fields is a 64 y.o. female presenting on 11/24/2019 for Eye Pain (Right side top of her eyelid getting bigger sore to touch--last year was left eye)  Virtual / Telehealth Encounter - Video Visit via MyChart The purpose of this virtual visit is to provide medical care while limiting exposure to the novel coronavirus (COVID19) for both patient and office staff.  Consent was obtained for remote visit:  Yes.   Answered questions that patient had about telehealth interaction:  Yes.   I discussed the limitations, risks, security and privacy concerns of performing an evaluation and management service by video/telephone. I also discussed with the patient that there may be a patient responsible charge related to this service. The patient expressed understanding and agreed to proceed.  Patient Location: Home Provider Location: [facility] (Office)   HPI   Right Eye, upper center lid - Stye She had same problem 1 year ago, seen by Dr Ellin Mayhew, given poly trim antibiotic eye drops and eye wash medicated and compresses, it resolved, seen by me and we evaluated was improving had residual chalazion forming and eventually it resolved. No new concerns. - Currently she is doing well otherwise, without acute illness, without fever chills sweats nausea vomiting loss of vision no other concerns Her husband has similar except has more external irritation of his eye and similar location R upper eyelid    Depression screen [facility] 2/9 09/21/2019 06/18/2019 03/17/2019  Decreased Interest 0 0 0  Down, Depressed, Hopeless 0 0 0  PHQ - 2 Score 0 0 0  Altered sleeping 0 - 1  Tired, decreased energy 0 - 1  Change in appetite 0 - 0  Feeling bad or failure about yourself  0 - 0  Trouble concentrating 0 - 0  Moving slowly or fidgety/restless 0 - 0  Suicidal thoughts 0 - 0  PHQ-9 Score 0 - 2  Difficult  doing work/chores Not difficult at all - Somewhat difficult  Some recent data might be hidden    Social History   Tobacco Use  . Smoking status: Former Smoker    Packs/day: 0.25    Years: 10.00    Pack years: 2.50    Types: Cigarettes    Quit date: 11/29/1995    Years since quitting: 24.0  . Smokeless tobacco: Former Systems developer  . Tobacco comment: Quite 1997, didnt smoke hardly at all when I was smoking.  Vaping Use  . Vaping Use: Never used  Substance Use Topics  . Alcohol use: No  . Drug use: No    Review of Systems Per HPI unless specifically indicated above     Objective:    There were no vitals taken for this visit.  Wt Readings from Last 3 Encounters:  10/20/19 189 lb 2 oz (85.8 kg)  09/21/19 189 lb (85.7 kg)  07/24/19 195 lb 9.6 oz (88.7 kg)    Physical Exam   Note examination was completely remotely via video observation objective data only  Gen - well-appearing, no acute distress or apparent pain, comfortable HEENT - Right upper eyelid with noticeable raised internal inside eyelid bump or stye - otherwise appear clear without discharge or redness Heart/Lungs - cannot examine virtually - observed no evidence of coughing or labored breathing. Skin - face visible today- no rash Neuro - awake, alert, oriented Psych - not anxious appearing  Results for orders placed or performed in visit on 09/21/19  VITAMIN D 25 Hydroxy (Vit-D Deficiency, Fractures)  Result Value Ref Range   Vit D, 25-Hydroxy 55 30 - 100 ng/mL  COMPLETE METABOLIC PANEL WITH GFR  Result Value Ref Range   Glucose, Bld 94 65 - 99 mg/dL   BUN 13 7 - 25 mg/dL   Creat 0.89 0.50 - 0.99 mg/dL   GFR, Est Non African American 69 > OR = 60 mL/min/1.69m2   GFR, Est African American 80 > OR = 60 mL/min/1.69m2   BUN/Creatinine Ratio NOT APPLICABLE 6 - 22 (calc)   Sodium 142 135 - 146 mmol/L   Potassium 4.9 3.5 - 5.3 mmol/L   Chloride 106 98 - 110 mmol/L   CO2 29 20 - 32 mmol/L   Calcium 9.3 8.6 - 10.4  mg/dL   Total Protein 6.7 6.1 - 8.1 g/dL   Albumin 4.4 3.6 - 5.1 g/dL   Globulin 2.3 1.9 - 3.7 g/dL (calc)   AG Ratio 1.9 1.0 - 2.5 (calc)   Total Bilirubin 0.9 0.2 - 1.2 mg/dL   Alkaline phosphatase (APISO) 46 37 - 153 U/L   AST 34 10 - 35 U/L   ALT 30 (H) 6 - 29 U/L  Vitamin B12  Result Value Ref Range   Vitamin B-12 694 200 - 1,100 pg/mL  Lipid panel  Result Value Ref Range   Cholesterol 145 <200 mg/dL   HDL 88 > OR = 50 mg/dL   Triglycerides 72 <150 mg/dL   LDL Cholesterol (Calc) 42 mg/dL (calc)   Total CHOL/HDL Ratio 1.6 <5.0 (calc)   Non-HDL Cholesterol (Calc) 57 <130 mg/dL (calc)      Assessment & Plan:   Problem List Items Addressed This Visit    None    Visit Diagnoses    Hordeolum internum of right upper eyelid    -  Primary   Relevant Medications   trimethoprim-polymyxin b (POLYTRIM) ophthalmic solution      Acute vs recurrent R upper internal hordeolum, last episode 07/2018 No evidence of complication, without conjunctivitis or extending eyelid/peri-orbital erythema or edema.  Plan: Start Polytrim antibiotic eye drops 1 drop in R eye every 4 hours for 7-10 days  Start moist warm compresses over Right eyelid 10-15 min at a time, up to 4-6 times daily until resolution  Strict return precautions for spreading infection, otherwise if mild improvement but persistent hordeolum (or develops more hard chalazion) then asked to notify us and we can refer to Ophthalmology for I&D removal  Follow-up within 1-2 weeks as needed     Meds ordered this encounter  Medications  . trimethoprim-polymyxin b (POLYTRIM) ophthalmic solution    Sig: Place 1 drop into the right eye every 4 (four) hours. For 7 days    Dispense:  10 mL    Refill:  0      Follow up plan: Return in about 2 weeks (around 12/08/2019), or if symptoms worsen or fail to improve, for 2 weeks if unresolved eye stye.   Patient verbalizes understanding with the above medical recommendations including  the limitation of remote medical advice.  Specific follow-up and call-back criteria were given for patient to follow-up or seek medical care more urgently if needed.  Total duration of direct patient care provided via video conference: 15 minutes   Nobie Putnam, [facility] 11/24/2019, 2:42 PM

## 2019-11-24 NOTE — Patient Instructions (Addendum)
Thank you for coming to the office today.  1. It looks like a Stye or "Hordeoleum" of your eyelid, this is a blocked duct infection, it may come back again if it does not fully resolve. If it develops a more hard or firm cyst, we consider it a "Chalazion" and it is less likely to resolve on it's own  Start poly trim antibiotic eye drops 4 drop in R eye every 4 hours approx for 7-10 days  - Initial treatment is warm compresses up to 4 to 6 times a day using warm washcloth place over eyelid and apply gentle pressure, hold this on for 10-15 min at a time, can re-warm if cools down  It may drain pus and reduce in size, this is ideal, otherwise if significant worsening with spreading of redness or swelling onto eyelid or around face, unable to see, fevers/chills, then please return to clinic sooner or call, may go to Emergency Dept if significant worsening  Otherwise, if improving but not going away after 1-2 weeks, call us back and we will refer you to Ophthalmology to have it removed.   Do not wear any contacts until this heals  Please schedule a follow-up appointment with Dr Parks Ranger within 1-2 weeks if not resolved   Please schedule a Follow-up Appointment to: Return in about 2 weeks (around 12/08/2019), or if symptoms worsen or fail to improve, for 2 weeks if unresolved eye stye.  If you have any other questions or concerns, please feel free to call the office or send a message through [place]. You may also schedule an earlier appointment if necessary.  Additionally, you may be receiving a survey about your experience at our office within a few days to 1 week by e-mail or mail. We value your feedback.  Nobie Putnam, DO [place]

## 2019-11-25 ENCOUNTER — Other Ambulatory Visit: Payer: Self-pay | Admitting: Psychiatry

## 2019-11-25 DIAGNOSIS — F4310 Post-traumatic stress disorder, unspecified: Secondary | ICD-10-CM

## 2019-11-25 DIAGNOSIS — G4701 Insomnia due to medical condition: Secondary | ICD-10-CM

## 2019-11-25 NOTE — Telephone Encounter (Signed)
Virtual visit completed 11/24/19  Ashley Fields, [facility] 11/25/2019, 8:08 AM

## 2019-12-07 DIAGNOSIS — M542 Cervicalgia: Secondary | ICD-10-CM | POA: Diagnosis not present

## 2019-12-07 DIAGNOSIS — M25552 Pain in left hip: Secondary | ICD-10-CM | POA: Diagnosis not present

## 2019-12-07 DIAGNOSIS — M479 Spondylosis, unspecified: Secondary | ICD-10-CM | POA: Diagnosis not present

## 2019-12-07 DIAGNOSIS — M47896 Other spondylosis, lumbar region: Secondary | ICD-10-CM | POA: Diagnosis not present

## 2019-12-07 DIAGNOSIS — G894 Chronic pain syndrome: Secondary | ICD-10-CM | POA: Diagnosis not present

## 2019-12-07 DIAGNOSIS — M533 Sacrococcygeal disorders, not elsewhere classified: Secondary | ICD-10-CM | POA: Diagnosis not present

## 2019-12-07 DIAGNOSIS — M48061 Spinal stenosis, lumbar region without neurogenic claudication: Secondary | ICD-10-CM | POA: Diagnosis not present

## 2019-12-07 DIAGNOSIS — Z79891 Long term (current) use of opiate analgesic: Secondary | ICD-10-CM | POA: Diagnosis not present

## 2019-12-07 DIAGNOSIS — Z79899 Other long term (current) drug therapy: Secondary | ICD-10-CM | POA: Diagnosis not present

## 2019-12-07 DIAGNOSIS — M545 Low back pain, unspecified: Secondary | ICD-10-CM | POA: Diagnosis not present

## 2019-12-18 ENCOUNTER — Ambulatory Visit (INDEPENDENT_AMBULATORY_CARE_PROVIDER_SITE_OTHER): Payer: 59 | Admitting: Family Medicine

## 2019-12-18 ENCOUNTER — Encounter: Payer: Self-pay | Admitting: Family Medicine

## 2019-12-18 ENCOUNTER — Other Ambulatory Visit: Payer: Self-pay

## 2019-12-18 VITALS — BP 147/68 | HR 92 | Temp 96.9°F | Resp 16 | Ht 61.0 in | Wt 187.0 lb

## 2019-12-18 DIAGNOSIS — S8002XA Contusion of left knee, initial encounter: Secondary | ICD-10-CM

## 2019-12-18 DIAGNOSIS — M25562 Pain in left knee: Secondary | ICD-10-CM

## 2019-12-18 NOTE — Progress Notes (Signed)
Subjective:    Patient ID: Ashley Fields, female    DOB: 06-08-1955, 64 y.o.   MRN: [id number]  Davine Sweney is a 64 y.o. female presenting on 12/18/2019 for Knee Pain (Left side onset 6 days due to fall )   HPI   Left Knee Pain, fall injury Left Knee Pain Reports accidental fall mechanical on past Sunday 6 days ago, outside, fell forward on outstretch hands and contact with Left knee, abrasion and cut her jeans. She had swelling and bruising. She has done ice packs and elevation, in recliner for few days did help it. - Followed by [facility], was on oxycodone being weaned down to Tramadol now, has pain medicine PRN. Currently off oxycodone. Denies other joint pain or swelling redness numbness tingling, new fall  Depression screen [facility] 2/9 09/21/2019 06/18/2019 03/17/2019  Decreased Interest 0 0 0  Down, Depressed, Hopeless 0 0 0  PHQ - 2 Score 0 0 0  Altered sleeping 0 - 1  Tired, decreased energy 0 - 1  Change in appetite 0 - 0  Feeling bad or failure about yourself  0 - 0  Trouble concentrating 0 - 0  Moving slowly or fidgety/restless 0 - 0  Suicidal thoughts 0 - 0  PHQ-9 Score 0 - 2  Difficult doing work/chores Not difficult at all - Somewhat difficult  Some recent data might be hidden    Social History   Tobacco Use  . Smoking status: Former Smoker    Packs/day: 0.25    Years: 10.00    Pack years: 2.50    Types: Cigarettes    Quit date: 11/29/1995    Years since quitting: 24.0  . Smokeless tobacco: Former Systems developer  . Tobacco comment: Quite 1997, didnt smoke hardly at all when I was smoking.  Vaping Use  . Vaping Use: Never used  Substance Use Topics  . Alcohol use: No  . Drug use: No    Review of Systems Per HPI unless specifically indicated above     Objective:    BP (!) 147/68   Pulse 92   Temp (!) 96.9 F (36.1 C) (Temporal)   Resp 16   Ht 5\' 1"  (1.549 m)   Wt 187 lb (84.8 kg)   SpO2 98%   BMI 35.33 kg/m   Wt Readings from Last 3  Encounters:  12/18/19 187 lb (84.8 kg)  10/20/19 189 lb 2 oz (85.8 kg)  09/21/19 189 lb (85.7 kg)    Physical Exam Vitals and nursing note reviewed.  Constitutional:      General: She is not in acute distress.    Appearance: She is well-developed and well-nourished. She is not diaphoretic.     Comments: Well-appearing, comfortable, cooperative  HENT:     Head: Normocephalic and atraumatic.     Mouth/Throat:     Mouth: Oropharynx is clear and moist.  Eyes:     General:        Right eye: No discharge.        Left eye: No discharge.     Conjunctiva/sclera: Conjunctivae normal.  Cardiovascular:     Rate and Rhythm: Normal rate.  Pulmonary:     Effort: Pulmonary effort is normal.  Musculoskeletal:        General: No edema.     Comments: Left Knee Inspection: Bulky appearance and symmetrical. Resolving ecchymosis and some edema. Palpation: Mild +TTP Left knee only medial joint line. No crepitus ROM: Full active ROM bilaterally Special  Testing: Lachman / Valgus/Varus tests negative with intact ligaments (ACL, MCL, LCL). Strength: 5/5 intact knee flex/ext, ankle dorsi/plantarflex Neurovascular: distally intact sensation light touch and pulses  Skin:    General: Skin is warm and dry.     Findings: No erythema or rash.  Neurological:     Mental Status: She is alert and oriented to person, place, and time.  Psychiatric:        Mood and Affect: Mood and affect normal.        Behavior: Behavior normal.     Comments: Well groomed, good eye contact, normal speech and thoughts    Results for orders placed or performed in visit on 09/21/19  VITAMIN D 25 Hydroxy (Vit-D Deficiency, Fractures)  Result Value Ref Range   Vit D, 25-Hydroxy 55 30 - 100 ng/mL  COMPLETE METABOLIC PANEL WITH GFR  Result Value Ref Range   Glucose, Bld 94 65 - 99 mg/dL   BUN 13 7 - 25 mg/dL   Creat 0.89 0.50 - 0.99 mg/dL   GFR, Est Non African American 69 > OR = 60 mL/min/1.68m2   GFR, Est African American  80 > OR = 60 mL/min/1.67m2   BUN/Creatinine Ratio NOT APPLICABLE 6 - 22 (calc)   Sodium 142 135 - 146 mmol/L   Potassium 4.9 3.5 - 5.3 mmol/L   Chloride 106 98 - 110 mmol/L   CO2 29 20 - 32 mmol/L   Calcium 9.3 8.6 - 10.4 mg/dL   Total Protein 6.7 6.1 - 8.1 g/dL   Albumin 4.4 3.6 - 5.1 g/dL   Globulin 2.3 1.9 - 3.7 g/dL (calc)   AG Ratio 1.9 1.0 - 2.5 (calc)   Total Bilirubin 0.9 0.2 - 1.2 mg/dL   Alkaline phosphatase (APISO) 46 37 - 153 U/L   AST 34 10 - 35 U/L   ALT 30 (H) 6 - 29 U/L  Vitamin B12  Result Value Ref Range   Vitamin B-12 694 200 - 1,100 pg/mL  Lipid panel  Result Value Ref Range   Cholesterol 145 <200 mg/dL   HDL 88 > OR = 50 mg/dL   Triglycerides 72 <150 mg/dL   LDL Cholesterol (Calc) 42 mg/dL (calc)   Total CHOL/HDL Ratio 1.6 <5.0 (calc)   Non-HDL Cholesterol (Calc) 57 <130 mg/dL (calc)      Assessment & Plan:   Problem List Items Addressed This Visit   None   Visit Diagnoses    Acute pain of left knee    -  Primary   Relevant Orders   DG Knee Complete 4 Views Left   Contusion of left patella, initial encounter       Relevant Orders   DG Knee Complete 4 Views Left      Acute Left knee anterior fall injury vs contusion recently from fall 6 days ago Abrasion healed, some mild residual edema and ecchymosis that is healing or nearly resolved. Able to ambulate and has intact range of motion Some point tenderness medial patella aspect Will pursue X-ray to rule out fracture of patella, no x-ray in office, she declines outpatient imaging at [facility] will order for [facility] she can return Monday for X-ray walk in   For treatment, Use RICE therapy, may use existing Tramadol that she has from pain management. Follow-up as needed  Orders Placed This Encounter  Procedures  . DG Knee Complete 4 Views Left    Standing Status:   Future    Standing Expiration Date:   12/17/2020  Order Specific Question:   Reason for Exam (SYMPTOM  OR DIAGNOSIS REQUIRED)    Answer:    Left knee contusion acute pain swelling bruise 6 days ago from fall, rule out patella fracture    Order Specific Question:   Preferred imaging location?    Answer:   [facility]     No orders of the defined types were placed in this encounter.   Follow up plan: Return if symptoms worsen or fail to improve.  Nobie Putnam, [facility] 12/18/2019, 11:10 AM

## 2019-12-18 NOTE — Patient Instructions (Addendum)
Thank you for coming to the office today.  [place] of Left knee to evaluate for possible patella fracture if it is negative it may be a bone bruise and will take time to heal.  Take Tramadol as needed  Use RICE therapy: - R - Rest / relative rest with activity modification avoid overuse of joint - I - Ice packs (make sure you use a towel or sock / something to protect skin) - C - Compression with flexible Knee Sleeve / ACE wrap to apply pressure and reduce swelling allowing more support - E - Elevation - if significant swelling, lift leg above heart level (toes above your nose) to help reduce swelling, most helpful at night after day of being on your feet  Please schedule a Follow-up Appointment to: Return if symptoms worsen or fail to improve.  If you have any other questions or concerns, please feel free to call the office or send a message through [place]. You may also schedule an earlier appointment if necessary.  Additionally, you may be receiving a survey about your experience at our office within a few days to 1 week by e-mail or mail. We value your feedback.  Nobie Putnam, DO [place]

## 2019-12-21 ENCOUNTER — Other Ambulatory Visit: Payer: Self-pay

## 2019-12-21 ENCOUNTER — Ambulatory Visit
Admission: RE | Admit: 2019-12-21 | Discharge: 2019-12-21 | Disposition: A | Discharge status: Home / Self Care | Payer: 59 | Attending: Family Medicine | Admitting: Family Medicine

## 2019-12-21 ENCOUNTER — Ambulatory Visit
Admission: RE | Admit: 2019-12-21 | Discharge: 2019-12-21 | Disposition: A | Discharge status: Home / Self Care | Payer: 59 | Source: Ambulatory Visit | Attending: Family Medicine | Admitting: Family Medicine

## 2019-12-21 DIAGNOSIS — S8002XA Contusion of left knee, initial encounter: Secondary | ICD-10-CM | POA: Diagnosis not present

## 2019-12-21 DIAGNOSIS — M7989 Other specified soft tissue disorders: Secondary | ICD-10-CM | POA: Diagnosis not present

## 2019-12-21 DIAGNOSIS — M25562 Pain in left knee: Secondary | ICD-10-CM

## 2019-12-21 IMAGING — DX DG FOOT COMPLETE 3+V*L*
3 series · 3 of 3 positions shown · non-contrast
Comparison: None.

CLINICAL DATA: Stepped on the rake puncture wound

EXAM:
LEFT FOOT - COMPLETE 3+ VIEW

[foot ap]
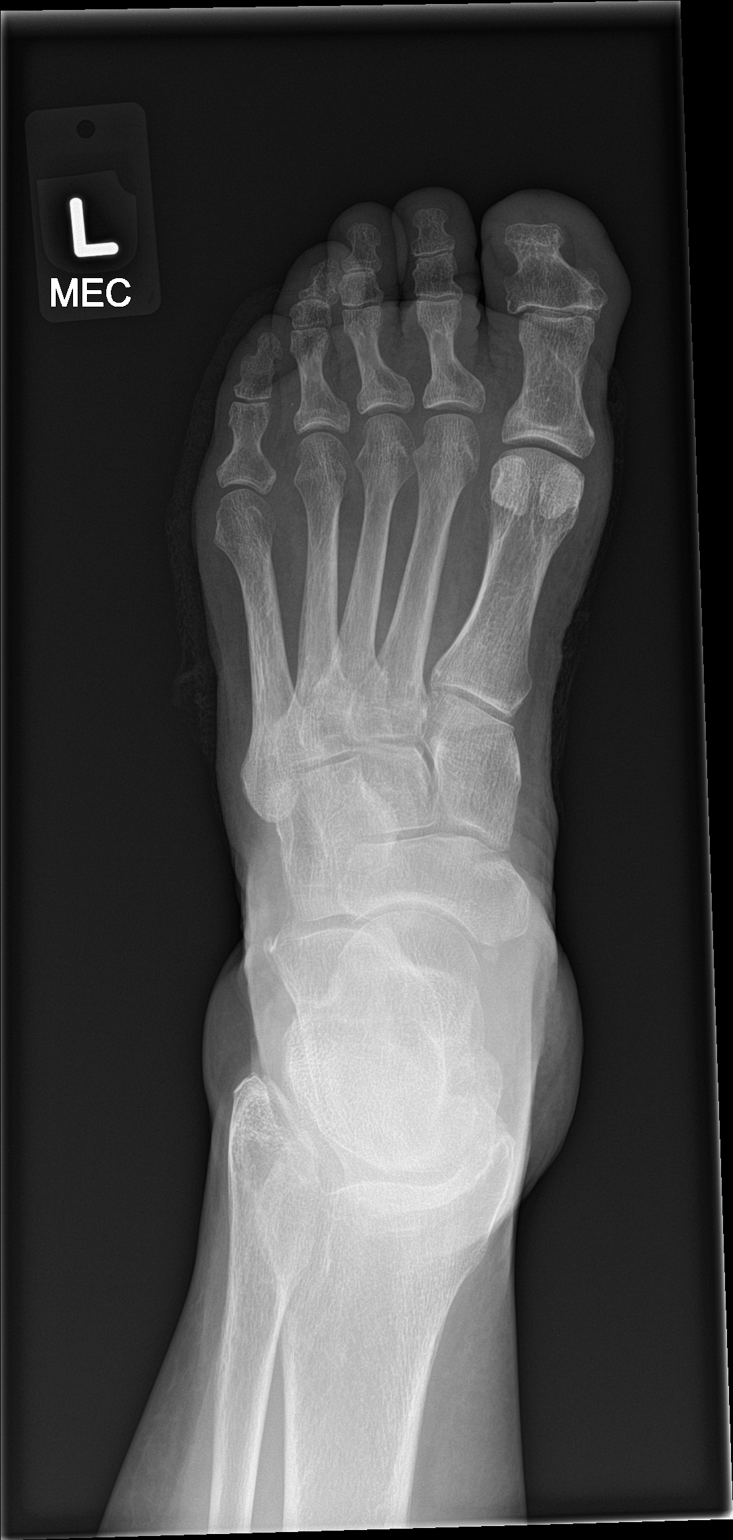

[foot obl]
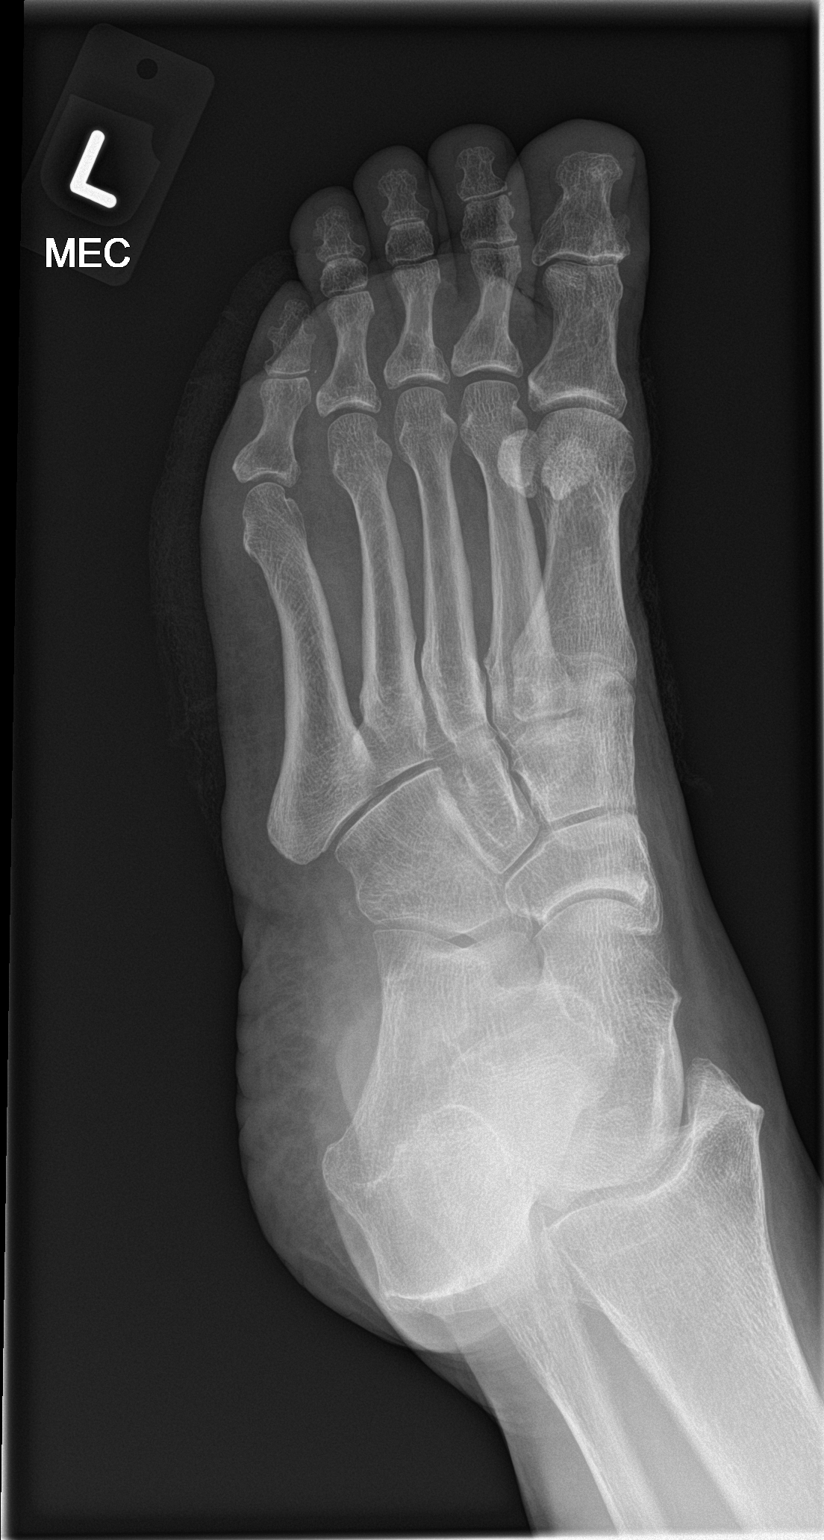

[foot lat]
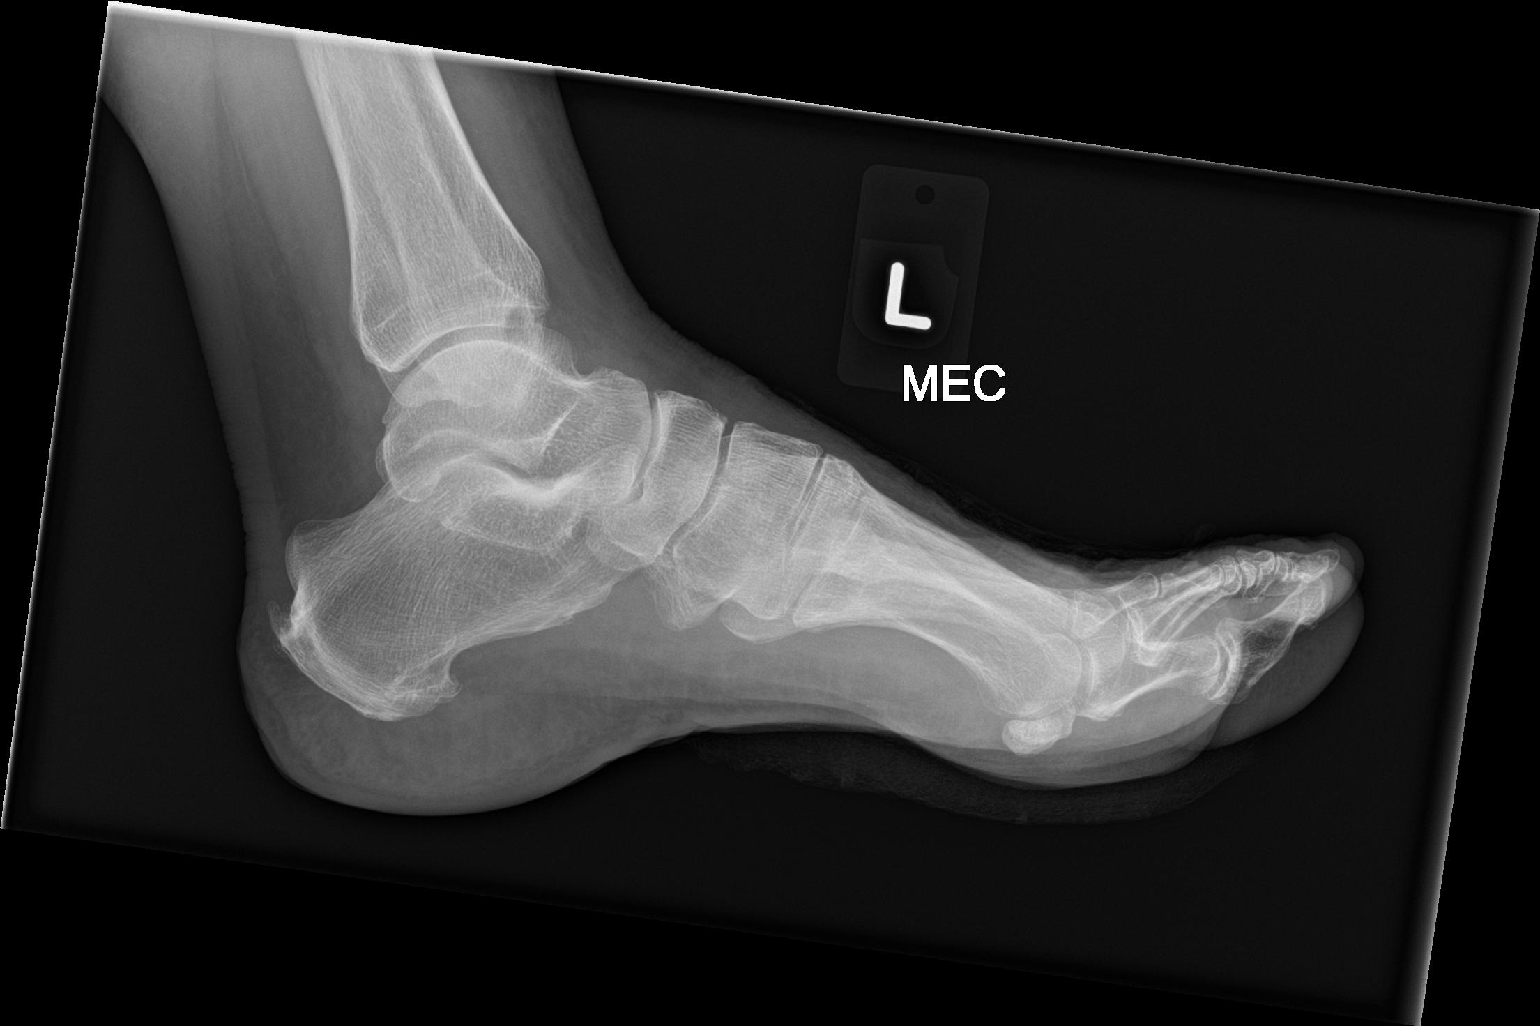

[3 of 3 positions shown; findings below may reference images not displayed]

FINDINGS: No fracture or malalignment. No radiopaque foreign body in the soft
tissues. Small plantar calcaneal spur and posterior enthesophyte.
IMPRESSION: No acute osseous abnormality.

## 2019-12-23 ENCOUNTER — Other Ambulatory Visit: Payer: Self-pay | Admitting: Family Medicine

## 2019-12-23 DIAGNOSIS — Z90710 Acquired absence of both cervix and uterus: Secondary | ICD-10-CM

## 2019-12-24 ENCOUNTER — Other Ambulatory Visit: Payer: Self-pay | Admitting: Psychiatry

## 2019-12-24 ENCOUNTER — Other Ambulatory Visit: Payer: Self-pay | Admitting: Family Medicine

## 2019-12-24 DIAGNOSIS — F3161 Bipolar disorder, current episode mixed, mild: Secondary | ICD-10-CM

## 2019-12-24 DIAGNOSIS — F410 Panic disorder [episodic paroxysmal anxiety] without agoraphobia: Secondary | ICD-10-CM

## 2019-12-24 DIAGNOSIS — F4310 Post-traumatic stress disorder, unspecified: Secondary | ICD-10-CM

## 2019-12-24 DIAGNOSIS — F3176 Bipolar disorder, in full remission, most recent episode depressed: Secondary | ICD-10-CM

## 2019-12-24 DIAGNOSIS — E782 Mixed hyperlipidemia: Secondary | ICD-10-CM

## 2019-12-24 DIAGNOSIS — Z90710 Acquired absence of both cervix and uterus: Secondary | ICD-10-CM

## 2019-12-28 ENCOUNTER — Other Ambulatory Visit: Payer: Self-pay

## 2019-12-28 ENCOUNTER — Ambulatory Visit (INDEPENDENT_AMBULATORY_CARE_PROVIDER_SITE_OTHER): Payer: 59 | Admitting: Family Medicine

## 2019-12-28 ENCOUNTER — Other Ambulatory Visit: Payer: Self-pay | Admitting: Family Medicine

## 2019-12-28 ENCOUNTER — Encounter: Payer: Self-pay | Admitting: Family Medicine

## 2019-12-28 DIAGNOSIS — E559 Vitamin D deficiency, unspecified: Secondary | ICD-10-CM

## 2019-12-28 DIAGNOSIS — Z9884 Bariatric surgery status: Secondary | ICD-10-CM

## 2019-12-28 DIAGNOSIS — Z0000 Encounter for general adult medical examination without abnormal findings: Secondary | ICD-10-CM

## 2019-12-28 DIAGNOSIS — F4310 Post-traumatic stress disorder, unspecified: Secondary | ICD-10-CM

## 2019-12-28 DIAGNOSIS — E538 Deficiency of other specified B group vitamins: Secondary | ICD-10-CM | POA: Diagnosis not present

## 2019-12-28 DIAGNOSIS — E782 Mixed hyperlipidemia: Secondary | ICD-10-CM

## 2019-12-28 DIAGNOSIS — R7309 Other abnormal glucose: Secondary | ICD-10-CM

## 2019-12-28 NOTE — Patient Instructions (Addendum)
Thank you for coming to the office today.  Keep on current track.  I am hopeful knee will continue to improve.  DUE for FASTING BLOOD WORK (no food or drink after midnight before the lab appointment, only water or coffee without cream/sugar on the morning of)  SCHEDULE "Lab Only" visit in the morning at the clinic for lab draw in 3 MONTHS   - Make sure Lab Only appointment is at about 1 week before your next appointment, so that results will be available  For Lab Results, once available within 2-3 days of blood draw, you can can log in to MyChart online to view your results and a brief explanation. Also, we can discuss results at next follow-up visit.'   Please schedule a Follow-up Appointment to: Return in about 3 months (around 03/27/2020) for 3 month fasting lab only then 1 week later Annual Physical.  If you have any other questions or concerns, please feel free to call the office or send a message through [place]. You may also schedule an earlier appointment if necessary.  Additionally, you may be receiving a survey about your experience at our office within a few days to 1 week by e-mail or mail. We value your feedback.  Nobie Putnam, DO [place]

## 2019-12-28 NOTE — Progress Notes (Signed)
Subjective:    Patient ID: Ashley Fields, female    DOB: January 02, 1956, 64 y.o.   MRN: [id number]  Ashley Fields is a 64 y.o. female presenting on 12/28/2019 for Weight Check   HPI   Vitamin D / B12 History of gastric bypass On Vitamin D OTC and B12 injection, requesting lab check vitamin levels  Morbid Obesity BMI >34 HLD CAD Fam history CAD Chronic problem with weight gain. Doing well on Ozempic higher 1mg  weekly with weight loss over >6 months. Weight trend >20 lbs lost in that time. Previously dose had been increased from 0.5 up to 1mg   She has history of prior significant morbid obesity and s/p gastric bypass surgery in 2005. She had done well overall but still has had some issues with continued weight gain over the years. Now recent had worsening. She is on variety of other medications for her mood currently and followed by Dr Shea Evans at [facility].  - For lifestyle, she continues to limit portions / calories, reduce starch/fats in diet - No history of Pre-Diabetes or DIabetes  Additional update Left knee Pain/Strain - improvement, still sore but overall improved since recent visit   Depression screen [facility] 2/9 12/28/2019 09/21/2019 06/18/2019  Decreased Interest 0 0 0  Down, Depressed, Hopeless 0 0 0  PHQ - 2 Score 0 0 0  Altered sleeping 1 0 -  Tired, decreased energy 0 0 -  Change in appetite 0 0 -  Feeling bad or failure about yourself  0 0 -  Trouble concentrating 0 0 -  Moving slowly or fidgety/restless 0 0 -  Suicidal thoughts 0 0 -  PHQ-9 Score 1 0 -  Difficult doing work/chores Not difficult at all Not difficult at all -  Some recent data might be hidden   GAD 7 : Generalized Anxiety Score 12/28/2019 03/17/2019 11/20/2018 08/18/2018  Nervous, Anxious, on Edge 1 1 1 1   Control/stop worrying 0 0 1 1  Worry too much - different things 0 0 1 2  Trouble relaxing 0 0 1 1  Restless 0 0 0 1  Easily annoyed or irritable 1 1 0 2  Afraid - awful might happen 0 0 0 1  Total GAD  7 Score 2 2 4 9   Anxiety Difficulty Somewhat difficult Somewhat difficult Somewhat difficult Somewhat difficult      Social History   Tobacco Use  . Smoking status: Former Smoker    Packs/day: 0.25    Years: 10.00    Pack years: 2.50    Types: Cigarettes    Quit date: 11/29/1995    Years since quitting: 24.0  . Smokeless tobacco: Former Systems developer  . Tobacco comment: Quite 1997, didnt smoke hardly at all when I was smoking.  Vaping Use  . Vaping Use: Never used  Substance Use Topics  . Alcohol use: No  . Drug use: No    Review of Systems Per HPI unless specifically indicated above     Objective:    BP 105/62   Pulse 82   Temp (!) 97.1 F (36.2 C) (Temporal)   Resp 16   Ht 5\' 1"  (1.549 m)   Wt 184 lb (83.5 kg)   SpO2 99%   BMI 34.77 kg/m   Wt Readings from Last 3 Encounters:  12/28/19 184 lb (83.5 kg)  12/18/19 187 lb (84.8 kg)  10/20/19 189 lb 2 oz (85.8 kg)    Physical Exam Vitals and nursing note reviewed.  Constitutional:  General: She is not in acute distress.    Appearance: She is well-developed and well-nourished. She is not diaphoretic.     Comments: Well-appearing, comfortable, cooperative  HENT:     Head: Normocephalic and atraumatic.     Mouth/Throat:     Mouth: Oropharynx is clear and moist.  Eyes:     General:        Right eye: No discharge.        Left eye: No discharge.     Conjunctiva/sclera: Conjunctivae normal.  Cardiovascular:     Rate and Rhythm: Normal rate.  Pulmonary:     Effort: Pulmonary effort is normal.  Musculoskeletal:        General: No edema.  Skin:    General: Skin is warm and dry.     Findings: No erythema or rash.  Neurological:     Mental Status: She is alert and oriented to person, place, and time.  Psychiatric:        Mood and Affect: Mood and affect normal.        Behavior: Behavior normal.     Comments: Well groomed, good eye contact, normal speech and thoughts        Results for orders placed or  performed in visit on 09/21/19  VITAMIN D 25 Hydroxy (Vit-D Deficiency, Fractures)  Result Value Ref Range   Vit D, 25-Hydroxy 55 30 - 100 ng/mL  COMPLETE METABOLIC PANEL WITH GFR  Result Value Ref Range   Glucose, Bld 94 65 - 99 mg/dL   BUN 13 7 - 25 mg/dL   Creat 0.89 0.50 - 0.99 mg/dL   GFR, Est Non African American 69 > OR = 60 mL/min/1.65m2   GFR, Est African American 80 > OR = 60 mL/min/1.35m2   BUN/Creatinine Ratio NOT APPLICABLE 6 - 22 (calc)   Sodium 142 135 - 146 mmol/L   Potassium 4.9 3.5 - 5.3 mmol/L   Chloride 106 98 - 110 mmol/L   CO2 29 20 - 32 mmol/L   Calcium 9.3 8.6 - 10.4 mg/dL   Total Protein 6.7 6.1 - 8.1 g/dL   Albumin 4.4 3.6 - 5.1 g/dL   Globulin 2.3 1.9 - 3.7 g/dL (calc)   AG Ratio 1.9 1.0 - 2.5 (calc)   Total Bilirubin 0.9 0.2 - 1.2 mg/dL   Alkaline phosphatase (APISO) 46 37 - 153 U/L   AST 34 10 - 35 U/L   ALT 30 (H) 6 - 29 U/L  Vitamin B12  Result Value Ref Range   Vitamin B-12 694 200 - 1,100 pg/mL  Lipid panel  Result Value Ref Range   Cholesterol 145 <200 mg/dL   HDL 88 > OR = 50 mg/dL   Triglycerides 72 <150 mg/dL   LDL Cholesterol (Calc) 42 mg/dL (calc)   Total CHOL/HDL Ratio 1.6 <5.0 (calc)   Non-HDL Cholesterol (Calc) 57 <130 mg/dL (calc)      Assessment & Plan:   Problem List Items Addressed This Visit    S/P gastric bypass   Relevant Orders   BASIC METABOLIC PANEL WITH GFR   Morbid obesity (HCC) - Primary   Relevant Orders   BASIC METABOLIC PANEL WITH GFR    Other Visit Diagnoses    Vitamin D deficiency       Relevant Orders   VITAMIN D 25 Hydroxy (Vit-D Deficiency, Fractures)   Vitamin B12 deficiency       Relevant Orders   Vitamin B12      Check labs today,  Vitamins B12, D, BMET for chemistry panel - recently had labs done already 09/2019 Defer rest of full lab panel until 03/2020 for annual Followed by Hematology upcoming apt Continue B12/D supplement/therapy Continue Ozempic 1mg  weekly for weight loss management, keep  improving  No orders of the defined types were placed in this encounter.     Follow up plan: Return in about 3 months (around 03/27/2020) for 3 month fasting lab only then 1 week later Annual Physical.  Future labs ordered for  03/2020 full physical labs and vitamin testing   Nobie Putnam, [facility] 12/28/2019, 8:17 AM

## 2019-12-29 LAB — BASIC METABOLIC PANEL WITH GFR
BUN/Creatinine Ratio: 18 (calc) (ref 6–22)
BUN: 18 mg/dL (ref 7–25)
CO2: 27 mmol/L (ref 20–32)
Calcium: 9.4 mg/dL (ref 8.6–10.4)
Chloride: 101 mmol/L (ref 98–110)
Creat: 1.01 mg/dL — ABNORMAL HIGH (ref 0.50–0.99)
GFR, Est African American: 69 mL/min/{1.73_m2} (ref >=60)
GFR, Est Non African American: 59 mL/min/{1.73_m2} — ABNORMAL LOW (ref >=60)
Glucose, Bld: 80 mg/dL (ref 65–99)
Potassium: 4.1 mmol/L (ref 3.5–5.3)
Sodium: 142 mmol/L (ref 135–146)

## 2019-12-29 LAB — VITAMIN B12: Vitamin B-12: 514 pg/mL (ref 200–1100)

## 2019-12-29 LAB — VITAMIN D 25 HYDROXY (VIT D DEFICIENCY, FRACTURES): Vit D, 25-Hydroxy: 61 ng/mL (ref 30–100)

## 2020-01-06 ENCOUNTER — Other Ambulatory Visit: Payer: Self-pay | Admitting: Family Medicine

## 2020-01-06 DIAGNOSIS — G8929 Other chronic pain: Secondary | ICD-10-CM

## 2020-01-06 DIAGNOSIS — G894 Chronic pain syndrome: Secondary | ICD-10-CM

## 2020-01-06 DIAGNOSIS — M5441 Lumbago with sciatica, right side: Secondary | ICD-10-CM

## 2020-01-14 ENCOUNTER — Other Ambulatory Visit: Payer: Self-pay | Admitting: Family Medicine

## 2020-01-14 DIAGNOSIS — G894 Chronic pain syndrome: Secondary | ICD-10-CM

## 2020-01-14 DIAGNOSIS — M5441 Lumbago with sciatica, right side: Secondary | ICD-10-CM

## 2020-01-14 DIAGNOSIS — G8929 Other chronic pain: Secondary | ICD-10-CM

## 2020-01-14 MED ORDER — GABAPENTIN 600 MG PO TABS: 600.0000 mg | ORAL_TABLET | Freq: Three times a day (TID) | ORAL | 3 refills | Status: DC

## 2020-01-14 NOTE — Telephone Encounter (Signed)
Received fax request for [facility] pharmacy.

## 2020-01-16 ENCOUNTER — Other Ambulatory Visit: Payer: Self-pay | Admitting: Psychiatry

## 2020-01-16 DIAGNOSIS — G4701 Insomnia due to medical condition: Secondary | ICD-10-CM

## 2020-01-16 DIAGNOSIS — F4310 Post-traumatic stress disorder, unspecified: Secondary | ICD-10-CM

## 2020-01-18 ENCOUNTER — Encounter: Payer: Self-pay | Admitting: Psychiatry

## 2020-01-18 ENCOUNTER — Telehealth (INDEPENDENT_AMBULATORY_CARE_PROVIDER_SITE_OTHER): Payer: 59 | Admitting: Psychiatry

## 2020-01-18 ENCOUNTER — Other Ambulatory Visit: Payer: Self-pay

## 2020-01-18 DIAGNOSIS — F410 Panic disorder [episodic paroxysmal anxiety] without agoraphobia: Secondary | ICD-10-CM | POA: Diagnosis not present

## 2020-01-18 DIAGNOSIS — G4701 Insomnia due to medical condition: Secondary | ICD-10-CM

## 2020-01-18 DIAGNOSIS — F3178 Bipolar disorder, in full remission, most recent episode mixed: Secondary | ICD-10-CM | POA: Diagnosis not present

## 2020-01-18 DIAGNOSIS — F4310 Post-traumatic stress disorder, unspecified: Secondary | ICD-10-CM

## 2020-01-18 MED ORDER — VENLAFAXINE HCL ER 37.5 MG PO CP24
37.5000 mg | ORAL_CAPSULE | Freq: Every day | ORAL | 0 refills | Status: DC
Start: 2020-01-18 — End: 2020-03-28

## 2020-01-18 MED ORDER — HYDROXYZINE HCL 25 MG PO TABS: 12.5000 mg | ORAL_TABLET | Freq: Two times a day (BID) | ORAL | 0 refills | Status: DC | PRN

## 2020-01-18 NOTE — Progress Notes (Addendum)
Virtual Visit via Video Note  I connected with Ashley Fields on 01/18/20 at 10:20 AM EST by a video enabled telemedicine application and verified that I am speaking with the correct person using two identifiers.  Location Provider Location : [facility] Patient Location : Home  Participants: Patient , Provider   I discussed the limitations of evaluation and management by telemedicine and the availability of in person appointments. The patient expressed understanding and agreed to proceed.   I discussed the assessment and treatment plan with the patient. The patient was provided an opportunity to ask questions and all were answered. The patient agreed with the plan and demonstrated an understanding of the instructions.   The patient was advised to call back or seek an in-person evaluation if the symptoms worsen or if the condition fails to improve as anticipated.   [place] MD OP Progress Note  01/18/2020 11:15 AM Ashley Fields  MRN:  [id number]  Chief Complaint:  Chief Complaint    Follow-up     HPI: Ashley Fields is a 65 year old Caucasian female, married, on disability, lives in [place], has a history of bipolar disorder, PTSD, chronic pain, migraine headaches, insomnia was evaluated by telemedicine today.  Patient today reports she had to go for jury duty recently.  She reports she went into a panic mode and had severe anxiety symptoms.  She reports she hence asked the judge to excuse her.  She however reports after she was excused that day she had to drive in the snow.  She went into a panic attack and had trouble driving.  Patient continues to be anxious and restless and on edge often.  She is interested in readjusting the dosage of her venlafaxine.  She reports sleep as good.  She denies any mood swings.  Patient denies any suicidality, homicidality or perceptual disturbances.  She is compliant on medications and denies side effects.  Patient is interested in psychotherapy Fields although  reports she will check with her health insurance plan and will make a decision.  Patient denies any other concerns today.  Visit Diagnosis:    ICD-10-CM   1. Bipolar disorder, in full remission, most recent episode mixed ([place])  F31.78   2. PTSD (post-traumatic stress disorder)  F43.10 venlafaxine XR (EFFEXOR XR) 37.5 MG 24 hr capsule    hydrOXYzine (ATARAX/VISTARIL) 25 MG tablet  3. Panic attacks  F41.0 venlafaxine XR (EFFEXOR XR) 37.5 MG 24 hr capsule    hydrOXYzine (ATARAX/VISTARIL) 25 MG tablet  4. Insomnia due to medical condition  G47.01     Past Psychiatric History: I have reviewed past psychiatric history from my progress note on 05/15/2018.  Past trials of Prozac, Wellbutrin, Lexapro, Cymbalta, Rexulti  Past Medical History:  Past Medical History:  Diagnosis Date  . Allergy   . Anemia   . Anxiety   . Arthritis    Yes  . Blood transfusion without reported diagnosis 1977   Transfusions from miscarriage & hemorrhaging  . Chronic kidney disease   . Depression   . Disp fx of cuboid bone of right foot, init for clos fx 01/01/2017  . GERD (gastroesophageal reflux disease)   . Headache   . Hepatitis C   . Hyperlipidemia   . Neuromuscular disorder ([place]) 1997   Falling to much was an eye-opener  . Osteoporosis   . Thyroid disease   . Urine incontinence     Past Surgical History:  Procedure Laterality Date  . 5 miscarriages     1977-1985, Blood  transfusion s/p miscarriage 1977  . ABDOMINAL HYSTERECTOMY  1987   Total  . APPENDECTOMY  12/2008  . AUGMENTATION MAMMAPLASTY Bilateral 2015   Bilat  . AUGMENTATION MAMMAPLASTY Bilateral 2018   implants redone w/ placement of implants under muscle  . breast lift bilateral, implants  0000000   bilateral, silicon naturel  . BREAST REDUCTION SURGERY Bilateral 1997  . CESAREAN SECTION  07/27/1981   Placenta Previa  . CHOLECYSTECTOMY  03/2004   Lap surgery  . COSMETIC SURGERY     Breast implants w/ lift, tummy tuck, upper &  lower Blepharop  . EYE SURGERY  2017   Cataract surgery & lasik  . GASTRIC BYPASS  2005   Laparoscopic  . IVC FILTER INSERTION  12/2003   TrapEase Vena Cava Filter  . LUMBAR LAMINECTOMY  06/2006   L4-L5 (spinal fusion)  . mini tummy tuck  05/07/2016   Bilateral bra/back roll lift skin removal  . neck surgery C3-C7  02/10/2015   ACDF  . REDUCTION MAMMAPLASTY Bilateral 1997  . SHOULDER ACROMIOPLASTY Left 03/27/2013   w/ labral debridement  . SMALL INTESTINE SURGERY  12/24/2003   Lap Gastric Bypass  . SPINAL CORD STIMULATOR IMPLANT  11/2010   removed 05/2011  . SPINE SURGERY  2008 2017   Fusion L4-L5, ACDF C4-C7    Family Psychiatric History: I have manage family psychiatric history from my progress note on 05/15/2018.  Family History:  Family History  Problem Relation Age of Onset  . COPD Mother   . Lung cancer Mother   . Diabetes Mother   . Heart disease Mother   . Stroke Mother   . Heart attack Mother   . Arthritis Mother   . Cancer Mother   . Hypertension Mother   . Obesity Mother   . Varicose Veins Mother   . Heart disease Father   . Heart attack Father 47  . Early death Father   . Depression Sister        x 5 sisters  . COPD Sister   . Hypertension Sister   . Heart disease Brother        x 3 brothers  . Diabetes Brother   . Colon polyps Sister   . Hearing loss Sister   . Heart attack Sister   . Heart attack Brother   . Arthritis Sister   . Varicose Veins Sister   . Arthritis Brother   . Heart disease Brother   . Hypertension Brother   . Cancer Maternal Grandfather   . Stroke Maternal Grandfather   . COPD Sister   . Obesity Sister   . Depression Sister   . Depression Sister   . Heart disease Sister   . Hypertension Sister   . Diabetes Brother   . Heart disease Brother   . Hypertension Brother   . Obesity Brother   . Varicose Veins Brother   . Heart disease Brother   . Hypertension Brother   . Obesity Brother   . Hypertension Sister   .  Obesity Sister   . Miscarriages / Stillbirths Maternal Aunt   . Miscarriages / Stillbirths Paternal Aunt   . Breast cancer Neg Hx     Social History: I have reviewed social history from my progress note on 05/15/2018. Social History   Socioeconomic History  . Marital status: Married    Spouse name: Not on file  . Number of children: 1  . Years of education: [employer]  . Highest education level:  Some college, no degree  Occupational History  . Not on file  Tobacco Use  . Smoking status: Former Smoker    Packs/day: 0.25    Years: 10.00    Pack years: 2.50    Types: Cigarettes    Quit date: 11/29/1995    Years since quitting: 24.1  . Smokeless tobacco: Former Systems developer  . Tobacco comment: Quite 1997, didnt smoke hardly at all when I was smoking.  Vaping Use  . Vaping Use: Never used  Substance and Sexual Activity  . Alcohol use: No  . Drug use: No  . Sexual activity: Yes    Birth control/protection: Condom, Post-menopausal, Surgical    Comment: Total Hysterectomy condoms  Other Topics Concern  . 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 Flagyl [Metronidazole] Anaphylaxis  . Cymbalta [Duloxetine Hcl]   . Tape Rash    Metabolic Disorder Labs: Lab Results  Component Value Date   HGBA1C 5.0 03/17/2019   MPG 97 03/17/2019   MPG 103 08/11/2018   No results found for: PROLACTIN Lab Results  Component Value Date   CHOL 145 09/22/2019   TRIG 72 09/22/2019   HDL 88 09/22/2019   CHOLHDL 1.6 09/22/2019   LDLCALC 42 09/22/2019   LDLCALC 53 03/17/2019   Lab Results  Component Value Date   TSH  0.86 08/11/2018   TSH 1.88 07/30/2017    Therapeutic Level Labs: No results found for: LITHIUM No results found for: VALPROATE No components found for:  CBMZ  Current Medications: Current Outpatient Medications  Medication Sig Dispense Refill  . hydrOXYzine (ATARAX/VISTARIL) 25 MG tablet Take 0.5-1 tablets (12.5-25 mg total) by mouth 2 (two) times daily as needed for anxiety. For severe panic attacks only 30 tablet 0  . venlafaxine XR (EFFEXOR XR) 37.5 MG 24 hr capsule Take 1 capsule (37.5 mg total) by mouth daily with breakfast. To be combined with 75 mg daily 90 capsule 0  . albuterol (VENTOLIN HFA) 108 (90 Base) MCG/ACT inhaler Inhale 2 puffs into the lungs every 4 (four) hours as needed for wheezing or shortness of breath (cough). 8 g 2  . alendronate (FOSAMAX) 70 MG tablet Take 1 tablet (70 mg total) by mouth every 7 (seven) days. Take with a full glass of water on an empty stomach. 4 tablet 11  . ARIPiprazole (ABILIFY) 10 MG tablet TAKE ONE TABLET BY MOUTH DAILY 90 tablet 0  . butalbital-acetaminophen-caffeine (FIORICET) 50-325-40 MG tablet Take 1 tablet by mouth 2 (two) times daily as needed for headache.    . cloNIDine (CATAPRES - DOSED IN MG/24 HR) 0.1 mg/24hr patch SMARTSIG:Topical    . [employer] ASPIRIN LOW DOSE 81 MG EC tablet TAKE 1 TABLET BY MOUTH EVERY DAY 90 tablet 3  . cyanocobalamin (,VITAMIN B-12,) 1000 MCG/ML injection Inject 1,000 mcg into the muscle every 30 (thirty) days.    . diclofenac Sodium (VOLTAREN) 1 % GEL Apply 2 g topically 4 (four) times daily.    Marland Kitchen estradiol (ESTRACE) 1 MG tablet TAKE ONE TABLET BY MOUTH DAILY 90 tablet 0  . etodolac (LODINE) 500 MG  tablet TAKE 1 TABLET BY MOUTH TWICE A DAY 180 tablet 1  . ezetimibe (ZETIA) 10 MG tablet TAKE ONE TABLET BY MOUTH DAILY 90 tablet 0  . FERROUS SULFATE-FOLIC ACID PO Take 1 tablet by mouth 2 (two) times daily.     . fluticasone (FLONASE) 50 MCG/ACT nasal spray SPRAY 2 SPRAYS INTO EACH NOSTRIL EVERY DAY 48 mL 3  .  furosemide (LASIX) 20 MG tablet Take 1 tablet (20 mg total) by mouth daily as needed for fluid or edema. 90 tablet 3  . gabapentin (NEURONTIN) 600 MG tablet Take 1 tablet (600 mg total) by mouth 3 (three) times daily. 90 tablet 3  . Melatonin 10 MG CAPS Take 2 capsules by mouth at bedtime.     . Multiple Vitamin (MULTIVITAMIN WITH MINERALS) TABS tablet Take 1 tablet by mouth daily.    Marland Kitchen omeprazole (PRILOSEC) 40 MG capsule TAKE 1 CAPSULE BY MOUTH EVERY DAY 90 capsule 2  . ondansetron (ZOFRAN ODT) 4 MG disintegrating tablet Take 1 tablet (4 mg total) by mouth every 8 (eight) hours as needed for nausea or vomiting. 30 tablet 2  . OZEMPIC, 1 MG/DOSE, 4 MG/3ML SOPN Inject 1 mg into the skin once a week. 3 mL 2  . phentermine (ADIPEX-P) 37.5 MG tablet Take 37.5 mg by mouth at bedtime.    . propranolol (INDERAL) 10 MG tablet TAKE ONE TABLET BY MOUTH THREE TIMES DAILY AS NEEDED for severe anxiety attacks 270 tablet 0  . rosuvastatin (CRESTOR) 40 MG tablet Take 1 tablet (40 mg total) by mouth daily. 90 tablet 3  . tiZANidine (ZANAFLEX) 4 MG tablet Take 4 mg by mouth 4 (four) times daily.    . traMADol (ULTRAM) 50 MG tablet Take 50 mg by mouth 3 (three) times daily as needed.    . traZODone (DESYREL) 100 MG tablet TAKE 1 TABLET BY MOUTH EVERYDAY AT BEDTIME 90 tablet 2  . trimethoprim-polymyxin b (POLYTRIM) ophthalmic solution Place 1 drop into the right eye every 4 (four) hours. For 7 days 10 mL 0  . venlafaxine XR (EFFEXOR-XR) 75 MG 24 hr capsule TAKE 1 CAPSULE (75 MG TOTAL) BY MOUTH DAILY WITH BREAKFAST. 90 capsule 1  . XIIDRA 5 % SOLN INSTILL 1 DROP INTO BOTH EYES TWICE A DAY     No current facility-administered medications for this visit.     Musculoskeletal: Strength & Muscle Tone: UTA Gait & Station: normal Patient leans: N/A  Psychiatric Specialty Exam: Review of Systems  Psychiatric/Behavioral: The patient is nervous/anxious.   All other systems reviewed and are negative.   There were no  vitals taken for this visit.There is no height or weight on file to calculate BMI.  General Appearance: Casual  Eye Contact:  Fair  Speech:  Clear and Coherent  Volume:  Normal  Mood:  Anxious  Affect:  Congruent  Thought Process:  Goal Directed and Descriptions of Associations: Intact  Orientation:  Full (Time, Place, and Person)  Thought Content: Logical   Suicidal Thoughts:  No  Homicidal Thoughts:  No  Memory:  Immediate;   Fair Recent;   Fair Remote;   Fair  Judgement:  Fair  Insight:  Fair  Psychomotor Activity:  Normal  Concentration:  Concentration: Fair and Attention Span: Fair  Recall:  [employer] of Knowledge: Fair  Language: Fair  Akathisia:  No  Handed:  Right  AIMS (if indicated): UTA  Assets:  Communication Skills Desire for Improvement Housing Social Support  ADL's:  Intact  Cognition: WNL  Sleep:  Fair   Screenings: GAD-7   Flowsheet Row Office Visit from 12/28/2019 in [facility] Office Visit from 03/17/2019 in [facility] Office Visit from 11/20/2018 in [facility] Office Visit from 08/18/2018 in [facility] Office Visit from 07/22/2018 in [facility]  Total GAD-7 Score 2 2 4 9 8     [id number]   [place] Office Visit from 12/28/2019 in [facility] Office Visit from 09/21/2019 in [facility] Office Visit from 06/18/2019 in [facility] Office Visit from 03/17/2019 in [facility] Office Visit from 12/16/2018 in [facility]  PHQ-2 Total Score 0 0 0 0 0  PHQ-9 Total Score 1 0 -- 2 --       Assessment and Plan: Ashley Fields is a 65 year old Caucasian female on disability, married, lives in [place], has a history of PTSD, bipolar disorder, panic attacks, migraine headaches, chronic pain was evaluated by telemedicine today.  Patient is currently struggling with anxiety symptoms and will benefit from the following  plan.  Plan Bipolar disorder information Abilify 10 mg p.o. daily. Venlafaxine as prescribed Trazodone 100 mg p.o. nightly for sleep  PTSD-unstable Increase venlafaxine extended release 112.5 mg p.o. daily Trazodone 100 mg p.o. nightly Melatonin 10 mg daily Add hydroxyzine 12.5 to 25 mg p.o. twice daily as needed for severe panic symptoms. Discussed referral for CBT , she will let writer know.  Panic attacks-unstable Increase venlafaxine 112.5 mg p.o. daily Start hydroxyzine 12.5-25 mg p.o. twice daily as needed for severe panic symptoms  Insomnia- stable Continue trazodone as prescribed.  Follow-up in clinic in 3 to 4 weeks or sooner if needed.  I have spent atleast 20 minutes face to face by video with patient today. More than 50 % of the time was spent for preparing to see the patient ( e.g., review of test, records ), ordering medications and test ,psychoeducation and supportive psychotherapy and care coordination,as well as documenting clinical information in electronic health record. This note was generated in part or whole with voice recognition software. Voice recognition is usually quite accurate but there are transcription errors that can and very often do occur. I apologize for any typographical errors that were not detected and corrected.       Ursula Alert, MD 01/18/2020, 11:15 AM

## 2020-01-18 NOTE — Patient Instructions (Signed)
Hydroxyzine capsules or tablets What is this medicine? HYDROXYZINE (hye [place] i zeen) is an antihistamine. This medicine is used to treat allergy symptoms. It is also used to treat anxiety and tension. This medicine can be used with other medicines to induce sleep before surgery. This medicine may be used for other purposes; ask your health care provider or pharmacist if you have questions. COMMON BRAND NAME(S): ANX, Atarax, Rezine, Vistaril What should I tell my health care provider before I take this medicine? They need to know if you have any of these conditions:  glaucoma  heart disease  history of irregular heartbeat  kidney disease  liver disease  lung or breathing disease, like asthma  stomach or intestine problems  thyroid disease  trouble passing urine  an unusual or allergic reaction to hydroxyzine, cetirizine, other medicines, foods, dyes or preservatives  pregnant or trying to get pregnant  breast-feeding How should I use this medicine? Take this medicine by mouth with a full glass of water. Follow the directions on the prescription label. You may take this medicine with food or on an empty stomach. Take your medicine at regular intervals. Do not take your medicine more often than directed. Talk to your pediatrician regarding the use of this medicine in children. Special care may be needed. While this drug may be prescribed for children as young as 64 years of age for selected conditions, precautions do apply. Patients over 36 years old may have a stronger reaction and need a smaller dose. Overdosage: If you think you have taken too much of this medicine contact a poison control center or emergency room at once. NOTE: This medicine is only for you. Do not share this medicine with others. What if I miss a dose? If you miss a dose, take it as soon as you can. If it is almost time for your next dose, take only that dose. Do not take double or extra doses. What may  interact with this medicine? Do not take this medicine with any of the following medications:  cisapride  dronedarone  pimozide  thioridazine This medicine may also interact with the following medications:  alcohol  antihistamines for allergy, cough, and cold  atropine  barbiturate medicines for sleep or seizures, like phenobarbital  certain antibiotics like erythromycin or clarithromycin  certain medicines for anxiety or sleep  certain medicines for bladder problems like oxybutynin, tolterodine  certain medicines for depression or psychotic disturbances  certain medicines for irregular heart beat  certain medicines for Parkinson's disease like benztropine, trihexyphenidyl  certain medicines for seizures like phenobarbital, primidone  certain medicines for stomach problems like dicyclomine, hyoscyamine  certain medicines for travel sickness like scopolamine  ipratropium  narcotic medicines for pain  other medicines that prolong the QT interval (an abnormal heart rhythm) like dofetilide This list may not describe all possible interactions. Give your health care provider a list of all the medicines, herbs, non-prescription drugs, or dietary supplements you use. Also tell them if you smoke, drink alcohol, or use illegal drugs. Some items may interact with your medicine. What should I watch for while using this medicine? Tell your doctor or health care professional if your symptoms do not improve. You may get drowsy or dizzy. Do not drive, use machinery, or do anything that needs mental alertness until you know how this medicine affects you. Do not stand or sit up quickly, especially if you are an older patient. This reduces the risk of dizzy or fainting spells. Alcohol may  interfere with the effect of this medicine. Avoid alcoholic drinks. Your mouth may get dry. Chewing sugarless gum or sucking hard candy, and drinking plenty of water may help. Contact your doctor if the  problem does not go away or is severe. This medicine may cause dry eyes and blurred vision. If you wear contact lenses you may feel some discomfort. Lubricating drops may help. See your eye doctor if the problem does not go away or is severe. If you are receiving skin tests for allergies, tell your doctor you are using this medicine. What side effects may I notice from receiving this medicine? Side effects that you should report to your doctor or health care professional as soon as possible:  allergic reactions like skin rash, itching or hives, swelling of the face, lips, or tongue  changes in vision  confusion  fast, irregular heartbeat  seizures  tremor  trouble passing urine or change in the amount of urine Side effects that usually do not require medical attention (report to your doctor or health care professional if they continue or are bothersome):  constipation  drowsiness  dry mouth  headache  tiredness This list may not describe all possible side effects. Call your doctor for medical advice about side effects. You may report side effects to FDA at 1-800-FDA-1088. Where should I keep my medicine? Keep out of the reach of children. Store at room temperature between 15 and 30 degrees C (59 and 86 degrees F). Keep container tightly closed. Throw away any unused medicine after the expiration date. NOTE: This sheet is a summary. It may not cover all possible information. If you have questions about this medicine, talk to your doctor, pharmacist, or health care provider.  2021 Elsevier/Gold Standard (2017-12-16 13:19:55)  

## 2020-01-19 ENCOUNTER — Other Ambulatory Visit: Payer: Self-pay | Admitting: Family Medicine

## 2020-01-19 DIAGNOSIS — I25118 Atherosclerotic heart disease of native coronary artery with other forms of angina pectoris: Secondary | ICD-10-CM

## 2020-01-19 DIAGNOSIS — Z8249 Family history of ischemic heart disease and other diseases of the circulatory system: Secondary | ICD-10-CM

## 2020-01-20 ENCOUNTER — Other Ambulatory Visit: Payer: Self-pay | Admitting: Psychiatry

## 2020-01-20 DIAGNOSIS — F410 Panic disorder [episodic paroxysmal anxiety] without agoraphobia: Secondary | ICD-10-CM

## 2020-01-20 DIAGNOSIS — F3176 Bipolar disorder, in full remission, most recent episode depressed: Secondary | ICD-10-CM

## 2020-01-20 DIAGNOSIS — J029 Acute pharyngitis, unspecified: Secondary | ICD-10-CM

## 2020-01-20 DIAGNOSIS — F4310 Post-traumatic stress disorder, unspecified: Secondary | ICD-10-CM

## 2020-01-21 ENCOUNTER — Telehealth (INDEPENDENT_AMBULATORY_CARE_PROVIDER_SITE_OTHER): Payer: 59 | Admitting: Family Medicine

## 2020-01-21 ENCOUNTER — Other Ambulatory Visit: Payer: Self-pay

## 2020-01-21 ENCOUNTER — Telehealth: Payer: Self-pay | Admitting: *Deleted

## 2020-01-21 ENCOUNTER — Encounter: Payer: Self-pay | Admitting: Family Medicine

## 2020-01-21 VITALS — Wt 178.0 lb

## 2020-01-21 DIAGNOSIS — J029 Acute pharyngitis, unspecified: Secondary | ICD-10-CM

## 2020-01-21 DIAGNOSIS — Z20822 Contact with and (suspected) exposure to covid-19: Secondary | ICD-10-CM | POA: Diagnosis not present

## 2020-01-21 MED ORDER — PREDNISONE 20 MG PO TABS: ORAL_TABLET | ORAL | 0 refills | Status: DC

## 2020-01-21 NOTE — Telephone Encounter (Signed)
Spoke with patient re: [facility]. She is schedule for lab on Monday 1/17 and follow-up and iron infusion on 01/27/20. I reviewed her chart. Patient is currently sick with covid like symptoms. She is being tested for covid on Saturday and we need to r/s her apts out.  Dr. B recommends r/s her apts 4-6 weeks. msg sent to scheduling to arrange.

## 2020-01-21 NOTE — Progress Notes (Signed)
Virtual Visit via Telephone The purpose of this virtual visit is to provide medical care while limiting exposure to the novel coronavirus (COVID19) for both patient and office staff.  Consent was obtained for phone visit:  Yes.   Answered questions that patient had about telehealth interaction:  Yes.   I discussed the limitations, risks, security and privacy concerns of performing an evaluation and management service by telephone. I also discussed with the patient that there may be a patient responsible charge related to this service. The patient expressed understanding and agreed to proceed.  Patient Location: Home Provider Location: [facility] (Office)  Participants in virtual visit: - Patient: Ashley Fields - CMA: Orinda Kenner, CMA - Provider: Dr Parks Ranger  ---------------------------------------------------------------------- Chief Complaint  Patient presents with  . Sore Throat  . Cough  . Fever    S: Reviewed CMA documentation. I have called patient and gathered additional HPI as follows:  Sore Throat / Fever / Cough Reports that symptoms started 2 days ago 01/19/20, sore throat, cough. Now developed fever last night. Sore throat limited her PO intake, she takes fluids, but limited solids. - Tried OTC Acetaminophen regularly, using honey cough drops Taking Etolodac regularly, not helping. - Admits pressure in ears - Using Flonase  She is vaccinated against covid last dose 08/2019  No sick contact   Denies any known or suspected exposure to person with or possibly with COVID19.  Denies any sweats, body ache, shortness of breath, abdominal pain, diarrhea, nausea vomiting   Past Medical History:  Diagnosis Date  . Allergy   . Anemia   . Anxiety   . Arthritis    Yes  . Blood transfusion without reported diagnosis 1977   Transfusions from miscarriage & hemorrhaging  . Chronic kidney disease   . Depression   . Disp fx of cuboid bone of right  foot, init for clos fx 01/01/2017  . GERD (gastroesophageal reflux disease)   . Headache   . Hepatitis C   . Hyperlipidemia   . Neuromuscular disorder ([place]) 1997   Falling to much was an eye-opener  . Osteoporosis   . Thyroid disease   . Urine incontinence    Social History   Tobacco Use  . Smoking status: Former Smoker    Packs/day: 0.25    Years: 10.00    Pack years: 2.50    Types: Cigarettes    Quit date: 11/29/1995    Years since quitting: 24.1  . Smokeless tobacco: Former Systems developer  . Tobacco comment: Quite 1997, didnt smoke hardly at all when I was smoking.  Vaping Use  . Vaping Use: Never used  Substance Use Topics  . Alcohol use: No  . Drug use: No    Current Outpatient Medications:  .  alendronate (FOSAMAX) 70 MG tablet, Take 1 tablet (70 mg total) by mouth every 7 (seven) days. Take with a full glass of water on an empty stomach., Disp: 4 tablet, Rfl: 11 .  ARIPiprazole (ABILIFY) 10 MG tablet, TAKE ONE TABLET BY MOUTH DAILY, Disp: 90 tablet, Rfl: 0 .  butalbital-acetaminophen-caffeine (FIORICET) 50-325-40 MG tablet, Take 1 tablet by mouth 2 (two) times daily as needed for headache., Disp: , Rfl:  .  cloNIDine (CATAPRES - DOSED IN MG/24 HR) 0.1 mg/24hr patch, SMARTSIG:Topical, Disp: , Rfl:  .  [facility] ASPIRIN LOW DOSE 81 MG EC tablet, TAKE 1 TABLET BY MOUTH EVERY DAY, Disp: 90 tablet, Rfl: 3 .  cyanocobalamin (,VITAMIN B-12,) 1000 MCG/ML injection, Inject  1,000 mcg into the muscle every 30 (thirty) days., Disp: , Rfl:  .  diclofenac Sodium (VOLTAREN) 1 % GEL, Apply 2 g topically 4 (four) times daily., Disp: , Rfl:  .  estradiol (ESTRACE) 1 MG tablet, TAKE ONE TABLET BY MOUTH DAILY, Disp: 90 tablet, Rfl: 0 .  etodolac (LODINE) 500 MG tablet, TAKE 1 TABLET BY MOUTH TWICE A DAY, Disp: 180 tablet, Rfl: 1 .  ezetimibe (ZETIA) 10 MG tablet, TAKE ONE TABLET BY MOUTH DAILY, Disp: 90 tablet, Rfl: 0 .  fluticasone (FLONASE) 50 MCG/ACT nasal spray, SPRAY 2 SPRAYS INTO EACH NOSTRIL EVERY  DAY, Disp: 48 mL, Rfl: 3 .  furosemide (LASIX) 20 MG tablet, Take 1 tablet (20 mg total) by mouth daily as needed for fluid or edema., Disp: 90 tablet, Rfl: 3 .  gabapentin (NEURONTIN) 600 MG tablet, Take 1 tablet (600 mg total) by mouth 3 (three) times daily., Disp: 90 tablet, Rfl: 3 .  hydrOXYzine (ATARAX/VISTARIL) 25 MG tablet, Take 0.5-1 tablets (12.5-25 mg total) by mouth 2 (two) times daily as needed for anxiety. For severe panic attacks only, Disp: 30 tablet, Rfl: 0 .  Melatonin 10 MG CAPS, Take 2 capsules by mouth at bedtime. , Disp: , Rfl:  .  Multiple Vitamin (MULTIVITAMIN WITH MINERALS) TABS tablet, Take 1 tablet by mouth daily., Disp: , Rfl:  .  omeprazole (PRILOSEC) 40 MG capsule, TAKE 1 CAPSULE BY MOUTH EVERY DAY, Disp: 90 capsule, Rfl: 2 .  ondansetron (ZOFRAN ODT) 4 MG disintegrating tablet, Take 1 tablet (4 mg total) by mouth every 8 (eight) hours as needed for nausea or vomiting., Disp: 30 tablet, Rfl: 2 .  OZEMPIC, 1 MG/DOSE, 4 MG/3ML SOPN, INJECT 1 MG INTO THE SKIN ONCE A WEEK., Disp: 3 mL, Rfl: 1 .  phentermine (ADIPEX-P) 37.5 MG tablet, Take 37.5 mg by mouth at bedtime., Disp: , Rfl:  .  predniSONE (DELTASONE) 20 MG tablet, Take daily with food. Start with 60mg  (3 pills) x 2 days, then reduce to 40mg  (2 pills) x 2 days, then 20mg  (1 pill) x 3 days, Disp: 13 tablet, Rfl: 0 .  propranolol (INDERAL) 10 MG tablet, TAKE ONE TABLET BY MOUTH THREE TIMES DAILY AS NEEDED for severe anxiety attacks, Disp: 270 tablet, Rfl: 0 .  rosuvastatin (CRESTOR) 40 MG tablet, Take 1 tablet (40 mg total) by mouth daily., Disp: 90 tablet, Rfl: 3 .  tiZANidine (ZANAFLEX) 4 MG tablet, Take 4 mg by mouth 4 (four) times daily., Disp: , Rfl:  .  traMADol (ULTRAM) 50 MG tablet, Take 50 mg by mouth 3 (three) times daily as needed., Disp: , Rfl:  .  traZODone (DESYREL) 100 MG tablet, TAKE 1 TABLET BY MOUTH EVERYDAY AT BEDTIME, Disp: 90 tablet, Rfl: 2 .  venlafaxine XR (EFFEXOR XR) 37.5 MG 24 hr capsule, Take 1  capsule (37.5 mg total) by mouth daily with breakfast. To be combined with 75 mg daily, Disp: 90 capsule, Rfl: 0 .  venlafaxine XR (EFFEXOR-XR) 75 MG 24 hr capsule, TAKE 1 CAPSULE (75 MG TOTAL) BY MOUTH DAILY WITH BREAKFAST., Disp: 30 capsule, Rfl: 5 .  XIIDRA 5 % SOLN, INSTILL 1 DROP INTO BOTH EYES TWICE A DAY, Disp: , Rfl:  .  albuterol (VENTOLIN HFA) 108 (90 Base) MCG/ACT inhaler, Inhale 2 puffs into the lungs every 4 (four) hours as needed for wheezing or shortness of breath (cough)., Disp: 8 g, Rfl: 2 .  FERROUS SULFATE-FOLIC ACID PO, Take 1 tablet by mouth 2 (two) times  daily. , Disp: , Rfl:   Depression screen [facility] 2/9 12/28/2019 09/21/2019 06/18/2019  Decreased Interest 0 0 0  Down, Depressed, Hopeless 0 0 0  PHQ - 2 Score 0 0 0  Altered sleeping 1 0 -  Tired, decreased energy 0 0 -  Change in appetite 0 0 -  Feeling bad or failure about yourself  0 0 -  Trouble concentrating 0 0 -  Moving slowly or fidgety/restless 0 0 -  Suicidal thoughts 0 0 -  PHQ-9 Score 1 0 -  Difficult doing work/chores Not difficult at all Not difficult at all -  Some recent data might be hidden    GAD 7 : Generalized Anxiety Score 12/28/2019 03/17/2019 11/20/2018 08/18/2018  Nervous, Anxious, on Edge 1 1 1 1   Control/stop worrying 0 0 1 1  Worry too much - different things 0 0 1 2  Trouble relaxing 0 0 1 1  Restless 0 0 0 1  Easily annoyed or irritable 1 1 0 2  Afraid - awful might happen 0 0 0 1  Total GAD 7 Score 2 2 4 9   Anxiety Difficulty Somewhat difficult Somewhat difficult Somewhat difficult Somewhat difficult    -------------------------------------------------------------------------- O: No physical exam performed due to remote telephone encounter.  Lab results reviewed.  Recent Results (from the past 2160 hour(s))  VITAMIN D 25 Hydroxy (Vit-D Deficiency, Fractures)     Status: None   Collection Time: 12/28/19  8:36 AM  Result Value Ref Range   Vit D, 25-Hydroxy 61 30 - 100 ng/mL     Comment: Vitamin D Status         25-OH Vitamin D: . Deficiency:                    <20 ng/mL Insufficiency:             20 - 29 ng/mL Optimal:                 > or = 30 ng/mL . For 25-OH Vitamin D testing on patients on  D2-supplementation and patients for whom quantitation  of D2 and D3 fractions is required, the [facility](TM) 25-OH VIT D, (D2,D3), LC/MS/MS is recommended: order  code [id number] (patients >71yrs). See Note 1 . Note 1 . For additional information, please refer to  http://education.Quest[facility].com/faq/FAQ199  (This link is being provided for informational/ educational purposes only.)   Vitamin B12     Status: None   Collection Time: 12/28/19  8:36 AM  Result Value Ref Range   Vitamin B-12 514 200 - 1,100 pg/mL  BASIC METABOLIC PANEL WITH GFR     Status: Abnormal   Collection Time: 12/28/19  8:36 AM  Result Value Ref Range   Glucose, Bld 80 65 - 99 mg/dL    Comment: .            Fasting reference interval .    BUN 18 7 - 25 mg/dL   Creat 1.01 (H) 0.50 - 0.99 mg/dL    Comment: For patients >9 years of age, the reference limit for Creatinine is approximately 13% higher for people identified as African-American. .    GFR, Est Non African American 59 (L) > OR = 60 mL/min/1.49m2   GFR, Est African American 69 > OR = 60 mL/min/1.100m2   BUN/Creatinine Ratio 18 6 - 22 (calc)   Sodium 142 135 - 146 mmol/L   Potassium 4.1 3.5 - 5.3 mmol/L   Chloride 101 98 -  110 mmol/L   CO2 27 20 - 32 mmol/L   Calcium 9.4 8.6 - 10.4 mg/dL    -------------------------------------------------------------------------- A&P:  Problem List Items Addressed This Visit   None   Visit Diagnoses    Acute viral pharyngitis    -  Primary   Relevant Medications   predniSONE (DELTASONE) 20 MG tablet   Suspected COVID-19 virus infection         Consistent with acute pharyngitis, suspected viral etiology with constellation of viral symptoms No sick contact Cannot rule out  COVID. She is vaccinated. Now febrile Tolerating PO liquids  Plan: 1. Recommend COVID testing - [facility] or [facility] w/ [facility] after hours testing - ADD Prednisone taper 7 day course for symptom relief. Hold oral NSAID 2. Symptomatic control with NSAID / Tylenol regularly then PRN 3. Improve hydration, advance diet, warm herbal tea with honey 4. Consider trial Nasal saline, / Flonase continue   She can call back by Monday next week consider add antibiotic therapy if indicated  Meds ordered this encounter  Medications  . predniSONE (DELTASONE) 20 MG tablet    Sig: Take daily with food. Start with 60mg  (3 pills) x 2 days, then reduce to 40mg  (2 pills) x 2 days, then 20mg  (1 pill) x 3 days    Dispense:  13 tablet    Refill:  0    Follow-up: - Return in 1 week if not improved. She can call back by Monday next week consider add antibiotic therapy if indicated  Patient verbalizes understanding with the above medical recommendations including the limitation of remote medical advice.  Specific follow-up and call-back criteria were given for patient to follow-up or seek medical care more urgently if needed.   - Time spent in direct consultation with patient on phone: 9 minutes  Nobie Putnam, [facility] 01/21/2020, 11:59 AM

## 2020-01-21 NOTE — Patient Instructions (Addendum)
COVID testing recommended [facility] [place] COVID Testing website / phone number sign up  It sounds most like a Viral Pharyngitis (Sore Throat) caused by a Virus - this will most likely run it's course in 5 to 10 days. Wash hands good to prevent spread of virus. - I do not see any evidence of Strep Throat today. No ear infection.  START Prednisone taper - for now while on steroid prednisone HOLD Ibuprofen / ETolodac AFTER - may try for Sore throat, take Ibuprofen 400-600mg  every 6-8 hours (3 times a day) with food, and you may also take Tylenol 500-1000mg  per dose every 6-8 hours (3 times a day) between ibuprofen doses as needed - Drink extra clear fluids (water, or G2 gatorade), try colder soft foods if needed otherwise regular diet - Drink warm herbal tea with honey to help reduce sore throat swelling - You can also try to cover for potential allergy symptoms that often make sore throat worse due to post-nasal drainage     - Try over the counter Nasal Saline spray (Simply Saline, Ocean Spray) as needed to reduce congestion     - Try Loratadine (Claritin) 10mg  daily for up to 4 weeks     - Flonase steroid nasal spray (OTC) 2 sprays in each nostril daily for 4 weeks   There is a chance that this is more of a Viral Pharyngitis, in which case it will take time to run it's course regardless, and may have some hoarseness or throat pain lasting for up to 7-10 days then improve    Please schedule a Follow-up Appointment to: Return in about 1 week (around 01/28/2020), or if symptoms worsen or fail to improve, for pharyngitis vs covid.  If you have any other questions or concerns, please feel free to call the office or send a message through [place]. You may also schedule an earlier appointment if necessary.  Additionally, you may be receiving a survey about your experience at our office within a few days to 1 week by e-mail or mail. We value your feedback.  Nobie Putnam, DO [facility]

## 2020-01-23 ENCOUNTER — Other Ambulatory Visit: Payer: Medicare Other

## 2020-01-23 DIAGNOSIS — Z20822 Contact with and (suspected) exposure to covid-19: Secondary | ICD-10-CM

## 2020-01-23 DIAGNOSIS — Z8616 Personal history of COVID-19: Secondary | ICD-10-CM

## 2020-01-23 HISTORY — DX: Personal history of COVID-19: Z86.16

## 2020-01-25 ENCOUNTER — Inpatient Hospital Stay: Payer: Medicare Other

## 2020-01-26 ENCOUNTER — Other Ambulatory Visit: Payer: Self-pay | Admitting: Psychiatry

## 2020-01-26 DIAGNOSIS — F4310 Post-traumatic stress disorder, unspecified: Secondary | ICD-10-CM

## 2020-01-26 DIAGNOSIS — F410 Panic disorder [episodic paroxysmal anxiety] without agoraphobia: Secondary | ICD-10-CM

## 2020-01-26 LAB — NOVEL CORONAVIRUS, NAA: SARS-CoV-2, NAA: DETECTED — AB

## 2020-01-26 MED ORDER — AMOXICILLIN-POT CLAVULANATE 875-125 MG PO TABS: 1.0000 | ORAL_TABLET | Freq: Two times a day (BID) | ORAL | 0 refills | Status: DC

## 2020-01-27 ENCOUNTER — Telehealth: Payer: Self-pay | Admitting: Oncology

## 2020-01-27 ENCOUNTER — Inpatient Hospital Stay: Payer: Medicare Other | Admitting: Internal Medicine

## 2020-01-27 ENCOUNTER — Inpatient Hospital Stay: Payer: Medicare Other

## 2020-01-27 ENCOUNTER — Other Ambulatory Visit: Payer: Self-pay | Admitting: *Deleted

## 2020-01-27 ENCOUNTER — Telehealth: Payer: Self-pay

## 2020-01-27 ENCOUNTER — Telehealth: Payer: Self-pay | Admitting: *Deleted

## 2020-01-27 NOTE — Telephone Encounter (Signed)
Called to discuss with patient about Covid symptoms and the use of a monoclonal antibody infusion for those with mild to moderate Covid symptoms and at a high risk of hospitalization.   Onset of symptoms was 01/15/2020.  Unfortunately this puts her beyond the 7-day onset of symptoms.  She is starting to feel much better.  Symptoms tier reviewed as well as criteria for ending isolation. Preventative practices reviewed. Patient verbalized understanding.  Jacquelin Hawking, NP  01/27/2020 3:18 PM

## 2020-01-27 NOTE — Telephone Encounter (Signed)
[name]    My name is Loss adjuster, chartered  and I am a Therapist, sports at [employer].   We tried calling you to see how you're feeling and to discuss your interest in one of the available treatments to treat early COVID-19 disease. These treatments are available for certain patients who are at risk for more severe symptoms or potential hospitalization related to [place].   If you're treated early, your chance of severe disease symptoms and hospitalization can be greatly reduced.   If you want to receive a call back to discuss these treatments further, please either respond to this MyChart message or call our hotline back at [phone]. If you call, please leave a voicemail with your name, telephone number and date of birth and tell [place] you were contacted as a possible patient to receive treatment by our team.   Your medical history, medication list and the date your illness started will help [place] decide the best treatment for you.   Regards,   Mell Mellott, RN This message sent to MyChart.

## 2020-01-27 NOTE — Telephone Encounter (Signed)
-----   Message from Mliss Sax, RN sent at 01/26/2020  6:24 PM EST ----- Patient notified of positive COVID-19 test results. Pt verbalized understanding. Pt reports symptoms of headache, cough, body aches, sore throat, fever. Criteria for self-isolation if you test positive for COVID-19, regardless of vaccination status:  -If you have mild symptoms that are resolving or have resolved, isolate at home for 5 days since symptoms started AND continue to wear a well-fitted mask when around others in the home and in public for 5 additional days after isolation is completed -If you have a fever and/or moderate to severe symptoms, isolate for at least 10 days since the symptoms started AND until you are fever free for at least 24 hours without the use of fever-reducing medications -If you tested positive and did not have symptoms, isolate for at least 5 days after your positive test  Use over-the-counter medications for symptoms.If you develop respiratory issues/distress, seek medical care in the Emergency Department.  If you must leave home or if you have to be around others please wear a mask. Please limit contact with immediate family members in the home, practice social distancing, frequent handwashing and clean hard surfaces touched frequently with household cleaning products. Members of your household will also need to quarantine and test.You may also be contacted by the health department for follow up. [facility] Department notified.

## 2020-01-27 NOTE — Telephone Encounter (Signed)
Called to discuss with patient about COVID-19 symptoms and the use of one of the available treatments for those with mild to moderate Covid symptoms and at a high risk of hospitalization.  Pt appears to qualify for outpatient treatment due to co-morbid conditions and/or a member of an at-risk group in accordance with the FDA Emergency Use Authorization.    Symptom onset:  Vaccinated:  Booster?  Immunocompromised?  Qualifiers:   Unable to reach pt - Number would ring then a message of try your call later.   Tarry Kos

## 2020-01-29 DIAGNOSIS — B349 Viral infection, unspecified: Secondary | ICD-10-CM

## 2020-01-29 DIAGNOSIS — J9801 Acute bronchospasm: Secondary | ICD-10-CM

## 2020-01-29 MED ORDER — ALBUTEROL SULFATE HFA 108 (90 BASE) MCG/ACT IN AERS
2.0000 | INHALATION_SPRAY | RESPIRATORY_TRACT | 2 refills | Status: DC | PRN
Start: 2020-01-29 — End: 2020-09-06

## 2020-02-04 DIAGNOSIS — B379 Candidiasis, unspecified: Secondary | ICD-10-CM

## 2020-02-04 DIAGNOSIS — J029 Acute pharyngitis, unspecified: Secondary | ICD-10-CM

## 2020-02-04 MED ORDER — FLUCONAZOLE 150 MG PO TABS: ORAL_TABLET | ORAL | 0 refills | Status: DC

## 2020-02-11 ENCOUNTER — Other Ambulatory Visit: Payer: Self-pay

## 2020-02-11 ENCOUNTER — Encounter: Payer: Self-pay | Admitting: Psychiatry

## 2020-02-11 ENCOUNTER — Telehealth (INDEPENDENT_AMBULATORY_CARE_PROVIDER_SITE_OTHER): Payer: 59 | Admitting: Psychiatry

## 2020-02-11 DIAGNOSIS — F3178 Bipolar disorder, in full remission, most recent episode mixed: Secondary | ICD-10-CM | POA: Diagnosis not present

## 2020-02-11 DIAGNOSIS — G4701 Insomnia due to medical condition: Secondary | ICD-10-CM

## 2020-02-11 DIAGNOSIS — F410 Panic disorder [episodic paroxysmal anxiety] without agoraphobia: Secondary | ICD-10-CM

## 2020-02-11 DIAGNOSIS — F4310 Post-traumatic stress disorder, unspecified: Secondary | ICD-10-CM

## 2020-02-11 MED ORDER — TRAZODONE HCL 100 MG PO TABS: ORAL_TABLET | ORAL | 2 refills | Status: DC

## 2020-02-11 NOTE — Progress Notes (Signed)
Virtual Visit via Video Note  I connected with Ashley Fields on 02/11/20 at 11:20 AM EST by a video enabled telemedicine application and verified that I am speaking with the correct person using two identifiers.  Location Provider Location : [facility] Patient Location : Car Participants: Patient , Provider    I discussed the limitations of evaluation and management by telemedicine and the availability of in person appointments. The patient expressed understanding and agreed to proceed.   I discussed the assessment and treatment plan with the patient. The patient was provided an opportunity to ask questions and all were answered. The patient agreed with the plan and demonstrated an understanding of the instructions.   The patient was advised to call back or seek an in-person evaluation if the symptoms worsen or if the condition fails to improve as anticipated.   [place] MD OP Progress Note  02/11/2020 11:58 AM Ashley Fields  MRN:  [id number]  Chief Complaint:  Chief Complaint    Follow-up     HPI: Ashley Fields is a 65 year old Caucasian female, married, on disability, lives in [place], has a history of bipolar disorder, PTSD, chronic pain, migraine headaches, insomnia was evaluated by telemedicine today.  Patient today reports she got infected with COVID-19 beginning of January.  She reports she struggled with fatigue, loss of sense of taste and smell.  She however reports she is currently much better.  She still has not gotten back her sense of taste and smell however she is functioning better and does not have any fatigue or other symptoms at this time.  She reports she is getting along with her sister much better.  She reports sleep is restless.  She reports she is sleeping only 5 hours at night.  She does not know what is waking her up.  She does not snore often.  She does not have any restless leg symptoms.  She currently takes trazodone and melatonin combination which helps to some extent.  She  is not interested in increasing the dosage of trazodone.  Patient denies any mood swings.  She denies any suicidality, homicidality or perceptual disturbances.  Patient denies any other concerns today.  Visit Diagnosis:    ICD-10-CM   1. Bipolar disorder, in full remission, most recent episode mixed ([place])  F31.78   2. PTSD (post-traumatic stress disorder)  F43.10 traZODone (DESYREL) 100 MG tablet  3. Panic attacks  F41.0   4. Insomnia due to medical condition  G47.01 traZODone (DESYREL) 100 MG tablet   mood disorder, pain    Past Psychiatric History: I have reviewed past psychiatric history from my progress note on 05/15/2018.  Past trials of Prozac, Wellbutrin, Lexapro, Cymbalta, Rexulti  Past Medical History:  Past Medical History:  Diagnosis Date  . Allergy   . Anemia   . Anxiety   . Arthritis    Yes  . Blood transfusion without reported diagnosis 1977   Transfusions from miscarriage & hemorrhaging  . Chronic kidney disease   . Depression   . Disp fx of cuboid bone of right foot, init for clos fx 01/01/2017  . GERD (gastroesophageal reflux disease)   . Headache   . Hepatitis C   . Hyperlipidemia   . Neuromuscular disorder ([place]) 1997   Falling to much was an eye-opener  . Osteoporosis   . Thyroid disease   . Urine incontinence     Past Surgical History:  Procedure Laterality Date  . 5 miscarriages     1977-1985, Blood transfusion s/p  miscarriage 1977  . ABDOMINAL HYSTERECTOMY  1987   Total  . APPENDECTOMY  12/2008  . AUGMENTATION MAMMAPLASTY Bilateral 2015   Bilat  . AUGMENTATION MAMMAPLASTY Bilateral 2018   implants redone w/ placement of implants under muscle  . breast lift bilateral, implants  0000000   bilateral, silicon naturel  . BREAST REDUCTION SURGERY Bilateral 1997  . CESAREAN SECTION  07/27/1981   Placenta Previa  . CHOLECYSTECTOMY  03/2004   Lap surgery  . COSMETIC SURGERY     Breast implants w/ lift, tummy tuck, upper & lower Blepharop  .  EYE SURGERY  2017   Cataract surgery & lasik  . GASTRIC BYPASS  2005   Laparoscopic  . IVC FILTER INSERTION  12/2003   TrapEase Vena Cava Filter  . LUMBAR LAMINECTOMY  06/2006   L4-L5 (spinal fusion)  . mini tummy tuck  05/07/2016   Bilateral bra/back roll lift skin removal  . neck surgery C3-C7  02/10/2015   ACDF  . REDUCTION MAMMAPLASTY Bilateral 1997  . SHOULDER ACROMIOPLASTY Left 03/27/2013   w/ labral debridement  . SMALL INTESTINE SURGERY  12/24/2003   Lap Gastric Bypass  . SPINAL CORD STIMULATOR IMPLANT  11/2010   removed 05/2011  . SPINE SURGERY  2008 2017   Fusion L4-L5, ACDF C4-C7    Family Psychiatric History: I have reviewed family psychiatric history from my progress note on 05/15/2018  Family History:  Family History  Problem Relation Age of Onset  . COPD Mother   . Lung cancer Mother   . Diabetes Mother   . Heart disease Mother   . Stroke Mother   . Heart attack Mother   . Arthritis Mother   . Cancer Mother   . Hypertension Mother   . Obesity Mother   . Varicose Veins Mother   . Heart disease Father   . Heart attack Father 31  . Early death Father   . Depression Sister        x 5 sisters  . COPD Sister   . Hypertension Sister   . Heart disease Brother        x 3 brothers  . Diabetes Brother   . Colon polyps Sister   . Hearing loss Sister   . Heart attack Sister   . Heart attack Brother   . Arthritis Sister   . Varicose Veins Sister   . Arthritis Brother   . Heart disease Brother   . Hypertension Brother   . Cancer Maternal Grandfather   . Stroke Maternal Grandfather   . COPD Sister   . Obesity Sister   . Depression Sister   . Depression Sister   . Heart disease Sister   . Hypertension Sister   . Diabetes Brother   . Heart disease Brother   . Hypertension Brother   . Obesity Brother   . Varicose Veins Brother   . Heart disease Brother   . Hypertension Brother   . Obesity Brother   . Hypertension Sister   . Obesity Sister   .  Miscarriages / Stillbirths Maternal Aunt   . Miscarriages / Stillbirths Paternal Aunt   . Breast cancer Neg Hx     Social History: I have reviewed social history from my progress note on 05/15/2018 Social History   Socioeconomic History  . Marital status: Married    Spouse name: Not on file  . Number of children: 1  . Years of education: [employer]  . Highest education level: Some college,  no degree  Occupational History  . Not on file  Tobacco Use  . Smoking status: Former Smoker    Packs/day: 0.25    Years: 10.00    Pack years: 2.50    Types: Cigarettes    Quit date: 11/29/1995    Years since quitting: 24.2  . Smokeless tobacco: Former Systems developer  . Tobacco comment: Quite 1997, didnt smoke hardly at all when I was smoking.  Vaping Use  . Vaping Use: Never used  Substance and Sexual Activity  . Alcohol use: No  . Drug use: No  . Sexual activity: Yes    Birth control/protection: Condom, Post-menopausal, Surgical    Comment: Total Hysterectomy condoms  Other Topics Concern  . 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 Flagyl [Metronidazole] Anaphylaxis  . Cymbalta [Duloxetine Hcl]   . Tape Rash    Metabolic Disorder Labs: Lab Results  Component Value Date   HGBA1C 5.0 03/17/2019   MPG 97 03/17/2019   MPG 103 08/11/2018   No results found for: PROLACTIN Lab Results  Component Value Date   CHOL 145 09/22/2019   TRIG 72 09/22/2019   HDL 88 09/22/2019   CHOLHDL 1.6 09/22/2019   LDLCALC 42 09/22/2019   LDLCALC 53 03/17/2019   Lab Results  Component Value Date   TSH 0.86 08/11/2018   TSH  1.88 07/30/2017    Therapeutic Level Labs: No results found for: LITHIUM No results found for: VALPROATE No components found for:  CBMZ  Current Medications: Current Outpatient Medications  Medication Sig Dispense Refill  . albuterol (VENTOLIN HFA) 108 (90 Base) MCG/ACT inhaler Inhale 2 puffs into the lungs every 4 (four) hours as needed for wheezing or shortness of breath (cough). 8 g 2  . alendronate (FOSAMAX) 70 MG tablet Take 1 tablet (70 mg total) by mouth every 7 (seven) days. Take with a full glass of water on an empty stomach. 4 tablet 11  . amoxicillin-clavulanate (AUGMENTIN) 875-125 MG tablet Take 1 tablet by mouth 2 (two) times daily. For 7 days 14 tablet 0  . ARIPiprazole (ABILIFY) 10 MG tablet TAKE ONE TABLET BY MOUTH DAILY 90 tablet 0  . butalbital-acetaminophen-caffeine (FIORICET) 50-325-40 MG tablet Take 1 tablet by mouth 2 (two) times daily as needed for headache.    . cloNIDine (CATAPRES - DOSED IN MG/24 HR) 0.1 mg/24hr patch SMARTSIG:Topical    . [facility] ASPIRIN LOW DOSE 81 MG EC tablet TAKE 1 TABLET BY MOUTH EVERY DAY 90 tablet 3  . cyanocobalamin (,VITAMIN B-12,) 1000 MCG/ML injection Inject 1,000 mcg into the muscle every 30 (thirty) days.    . diclofenac Sodium (VOLTAREN) 1 % GEL Apply 2 g topically 4 (four) times daily.    Marland Kitchen estradiol (ESTRACE) 1 MG tablet TAKE ONE TABLET BY MOUTH DAILY 90 tablet 0  . etodolac (LODINE) 500 MG tablet TAKE 1 TABLET BY MOUTH TWICE A DAY 180 tablet 1  . ezetimibe (ZETIA) 10 MG tablet TAKE ONE TABLET BY MOUTH DAILY 90 tablet 0  . FERROUS SULFATE-FOLIC ACID PO Take 1 tablet by mouth 2 (two) times daily.     Marland Kitchen  fluconazole (DIFLUCAN) 150 MG tablet Take one tablet by mouth on Day 1. Repeat dose 2nd tablet on Day 3. 2 tablet 0  . fluticasone (FLONASE) 50 MCG/ACT nasal spray SPRAY 2 SPRAYS INTO EACH NOSTRIL EVERY DAY 48 mL 3  . furosemide (LASIX) 20 MG tablet Take 1 tablet (20 mg total) by mouth daily as needed for fluid or edema. 90 tablet 3  .  gabapentin (NEURONTIN) 600 MG tablet Take 1 tablet (600 mg total) by mouth 3 (three) times daily. 90 tablet 3  . hydrOXYzine (ATARAX/VISTARIL) 25 MG tablet TAKE 0.5-1 TABLETS (12.5-25 MG TOTAL) BY MOUTH 2 (TWO) TIMES DAILY AS NEEDED FOR ANXIETY. FOR SEVERE PANIC ATTACKS ONLY 180 tablet 1  . Melatonin 10 MG CAPS Take 2 capsules by mouth at bedtime.     . Multiple Vitamin (MULTIVITAMIN WITH MINERALS) TABS tablet Take 1 tablet by mouth daily.    Marland Kitchen omeprazole (PRILOSEC) 40 MG capsule TAKE 1 CAPSULE BY MOUTH EVERY DAY 90 capsule 2  . ondansetron (ZOFRAN ODT) 4 MG disintegrating tablet Take 1 tablet (4 mg total) by mouth every 8 (eight) hours as needed for nausea or vomiting. 30 tablet 2  . OZEMPIC, 1 MG/DOSE, 4 MG/3ML SOPN INJECT 1 MG INTO THE SKIN ONCE A WEEK. 3 mL 1  . phentermine (ADIPEX-P) 37.5 MG tablet Take 37.5 mg by mouth at bedtime.    . predniSONE (DELTASONE) 20 MG tablet Take daily with food. Start with 60mg  (3 pills) x 2 days, then reduce to 40mg  (2 pills) x 2 days, then 20mg  (1 pill) x 3 days 13 tablet 0  . propranolol (INDERAL) 10 MG tablet TAKE ONE TABLET BY MOUTH THREE TIMES DAILY AS NEEDED for severe anxiety attacks 270 tablet 0  . rosuvastatin (CRESTOR) 40 MG tablet Take 1 tablet (40 mg total) by mouth daily. 90 tablet 3  . tiZANidine (ZANAFLEX) 4 MG tablet Take 4 mg by mouth 4 (four) times daily.    . traMADol (ULTRAM) 50 MG tablet Take 50 mg by mouth 3 (three) times daily as needed.    . traZODone (DESYREL) 100 MG tablet Take 1 tablet by mouth at bedtime for sleep 90 tablet 2  . venlafaxine XR (EFFEXOR XR) 37.5 MG 24 hr capsule Take 1 capsule (37.5 mg total) by mouth daily with breakfast. To be combined with 75 mg daily 90 capsule 0  . venlafaxine XR (EFFEXOR-XR) 75 MG 24 hr capsule TAKE 1 CAPSULE (75 MG TOTAL) BY MOUTH DAILY WITH BREAKFAST. 30 capsule 5  . XIIDRA 5 % SOLN INSTILL 1 DROP INTO BOTH EYES TWICE A DAY     No current facility-administered medications for this visit.      Musculoskeletal: Strength & Muscle Tone: UTA Gait & Station: UTA Patient leans: N/A  Psychiatric Specialty Exam: Review of Systems  HENT:       Loss of sense of taste and smell ,improving , status post COVID-19 infection  Psychiatric/Behavioral: Positive for sleep disturbance. The patient is nervous/anxious.   All other systems reviewed and are negative.   There were no vitals taken for this visit.There is no height or weight on file to calculate BMI.  General Appearance: Casual  Eye Contact:  Fair  Speech:  Clear and Coherent  Volume:  Normal  Mood:  Anxious improving  Affect:  Congruent  Thought Process:  Goal Directed and Descriptions of Associations: Intact  Orientation:  Full (Time, Place, and Person)  Thought Content: Logical   Suicidal Thoughts:  No  Homicidal Thoughts:  No  Memory:  Immediate;   Fair Recent;   Fair Remote;   Fair  Judgement:  Fair  Insight:  Fair  Psychomotor Activity:  Normal  Concentration:  Concentration: Fair and Attention Span: Fair  Recall:  [employer] of Knowledge: Fair  Language: Fair  Akathisia:  No  Handed:  Right  AIMS (if indicated): UTA  Assets:  Communication Skills Desire for Improvement Housing Social Support  ADL's:  Intact  Cognition: WNL  Sleep:  Restless   Screenings: GAD-7   Flowsheet Row Office Visit from 12/28/2019 in [facility] Office Visit from 03/17/2019 in [facility] Office Visit from 11/20/2018 in [facility] Office Visit from 08/18/2018 in [facility] Office Visit from 07/22/2018 in [facility]  Total GAD-7 Score 2 2 4 9 8     PHQ2-9   [place] Office Visit from 12/28/2019 in [facility] Office Visit from 09/21/2019 in [facility] Office Visit from 06/18/2019 in [facility] Office Visit from 03/17/2019 in [facility] Office Visit from 12/16/2018 in [place]  PHQ-2 Total Score 0 0 0 0 0  PHQ-9 Total Score 1 0 - 2 -       Assessment and Plan: Ashley Fields is a 65 year old Caucasian female on disability, married, lives in [place], has a history of PTSD, bipolar disorder, panic attacks, migraine headaches, chronic pain was evaluated by telemedicine today.  Patient is currently making progress with regards to her mood however does have sleep problems.  Patient recovering from COVID-19 infection.  Plan as noted below.  Plan Bipolar disorder in remission Abilify 10 mg p.o. daily Venlafaxine as prescribed Trazodone 100 mg p.o. nightly for sleep  PTSD-improving Venlafaxine extended release 112.5 mg p.o. daily Trazodone 100 mg p.o. nightly Discussed with patient to increase melatonin to 10 to 15 mg p.o. nightly Hydroxyzine 12.5 to 25 mg p.o. twice daily as needed for severe panic symptoms.  Advised patient she could also take hydroxyzine at bedtime as needed for sleep.  Panic attacks-improving Venlafaxine 112.5 mg p.o. daily Hydroxyzine as needed  Insomnia-unstable Trazodone 100 mg p.o. nightly Increase melatonin to 10 to 15 mg p.o. nightly Hydroxyzine 25 mg p.o. nightly as needed  Follow-up in clinic in 4 weeks or sooner if needed.  I have spent atleast 20 minutes face to face by video with patient today. More than 50 % of the time was spent for preparing to see the patient ( e.g., review of test, records ),  ordering medications and test ,psychoeducation and supportive psychotherapy and care coordination,as well as documenting clinical information in electronic health record. This note was generated in part or whole with voice recognition software. Voice recognition is usually quite accurate but there are transcription errors that can and very often do occur. I apologize for any typographical errors that were not detected and corrected.       Ursula Alert, MD 02/11/2020, 11:58 AM

## 2020-02-18 ENCOUNTER — Other Ambulatory Visit: Payer: Self-pay | Admitting: Family Medicine

## 2020-02-18 DIAGNOSIS — J3089 Other allergic rhinitis: Secondary | ICD-10-CM

## 2020-02-18 NOTE — Telephone Encounter (Signed)
Requested medication (s) are due for refill today: expired medication  Requested medication (s) are on the active medication list: yes   Last refill:  11/03/2018 #48 3 refills   Future visit scheduled: yes in 1 month  Notes to clinic:  expired medication     Requested Prescriptions  Pending Prescriptions Disp Refills   fluticasone (FLONASE) 50 MCG/ACT nasal spray [Pharmacy Med Name: FLUTICASONE PROP 50 MCG SPRAY] 48 mL 3    Sig: SPRAY 2 SPRAYS INTO EACH NOSTRIL EVERY DAY      Ear, Nose, and Throat: Nasal Preparations - Corticosteroids Passed - 02/18/2020 11:04 AM      Passed - Valid encounter within last 12 months    Recent Outpatient Visits           4 weeks ago Acute viral pharyngitis   [place], DO   1 month ago Morbid obesity [facility])   [place], DO   2 months ago Acute pain of left knee   [place], DO   2 months ago Hordeolum internum of right upper eyelid   [place], DO   5 months ago Morbid obesity [facility])   [facility] Parks Ranger, Devonne Doughty, DO       Future Appointments             In 1 month Parks Ranger, Devonne Doughty, [facility], [facility]

## 2020-02-29 ENCOUNTER — Other Ambulatory Visit: Payer: Self-pay | Admitting: Psychiatry

## 2020-02-29 DIAGNOSIS — F4310 Post-traumatic stress disorder, unspecified: Secondary | ICD-10-CM

## 2020-02-29 DIAGNOSIS — F3176 Bipolar disorder, in full remission, most recent episode depressed: Secondary | ICD-10-CM

## 2020-02-29 DIAGNOSIS — F410 Panic disorder [episodic paroxysmal anxiety] without agoraphobia: Secondary | ICD-10-CM

## 2020-03-03 ENCOUNTER — Inpatient Hospital Stay: Payer: 59 | Attending: Internal Medicine

## 2020-03-03 DIAGNOSIS — D509 Iron deficiency anemia, unspecified: Secondary | ICD-10-CM | POA: Insufficient documentation

## 2020-03-03 DIAGNOSIS — D696 Thrombocytopenia, unspecified: Secondary | ICD-10-CM | POA: Diagnosis not present

## 2020-03-03 DIAGNOSIS — E559 Vitamin D deficiency, unspecified: Secondary | ICD-10-CM | POA: Diagnosis not present

## 2020-03-03 DIAGNOSIS — K912 Postsurgical malabsorption, not elsewhere classified: Secondary | ICD-10-CM | POA: Insufficient documentation

## 2020-03-03 DIAGNOSIS — Z79899 Other long term (current) drug therapy: Secondary | ICD-10-CM | POA: Insufficient documentation

## 2020-03-03 DIAGNOSIS — Z9884 Bariatric surgery status: Secondary | ICD-10-CM | POA: Diagnosis not present

## 2020-03-03 DIAGNOSIS — D508 Other iron deficiency anemias: Secondary | ICD-10-CM

## 2020-03-03 LAB — CBC WITH DIFFERENTIAL/PLATELET
Abs Immature Granulocytes: 0.01 10*3/uL (ref 0.00–0.07)
Basophils Absolute: 0 10*3/uL (ref 0.0–0.1)
Basophils Relative: 1 %
Eosinophils Absolute: 0.1 10*3/uL (ref 0.0–0.5)
Eosinophils Relative: 2 %
HCT: 42.8 % (ref 36.0–46.0)
Hemoglobin: 14 g/dL (ref 12.0–15.0)
Immature Granulocytes: 0 %
Lymphocytes Relative: 37 %
Lymphs Abs: 1.9 10*3/uL (ref 0.7–4.0)
MCH: 29.9 pg (ref 26.0–34.0)
MCHC: 32.7 g/dL (ref 30.0–36.0)
MCV: 91.3 fL (ref 80.0–100.0)
Monocytes Absolute: 0.4 10*3/uL (ref 0.1–1.0)
Monocytes Relative: 7 %
Neutro Abs: 2.8 10*3/uL (ref 1.7–7.7)
Neutrophils Relative %: 53 %
Platelets: 159 10*3/uL (ref 150–400)
RBC: 4.69 MIL/uL (ref 3.87–5.11)
RDW: 12.7 % (ref 11.5–15.5)
WBC: 5.3 10*3/uL (ref 4.0–10.5)
nRBC: 0 % (ref 0.0–0.2)

## 2020-03-03 LAB — IRON AND TIBC
Iron: 90 ug/dL (ref 28–170)
Saturation Ratios: 20 % (ref 10.4–31.8)
TIBC: 458 ug/dL — ABNORMAL HIGH (ref 250–450)
UIBC: 368 ug/dL

## 2020-03-03 LAB — VITAMIN B12: Vitamin B-12: 514 pg/mL (ref 180–914)

## 2020-03-07 ENCOUNTER — Other Ambulatory Visit: Payer: Self-pay | Admitting: Psychiatry

## 2020-03-07 ENCOUNTER — Encounter: Payer: Self-pay | Admitting: Internal Medicine

## 2020-03-07 ENCOUNTER — Other Ambulatory Visit: Payer: Self-pay

## 2020-03-07 ENCOUNTER — Inpatient Hospital Stay: Payer: 59

## 2020-03-07 ENCOUNTER — Inpatient Hospital Stay (HOSPITAL_BASED_OUTPATIENT_CLINIC_OR_DEPARTMENT_OTHER): Payer: 59 | Admitting: Internal Medicine

## 2020-03-07 VITALS — BP 135/75 | HR 80 | Resp 16

## 2020-03-07 DIAGNOSIS — D508 Other iron deficiency anemias: Secondary | ICD-10-CM

## 2020-03-07 DIAGNOSIS — D509 Iron deficiency anemia, unspecified: Secondary | ICD-10-CM | POA: Diagnosis not present

## 2020-03-07 DIAGNOSIS — F410 Panic disorder [episodic paroxysmal anxiety] without agoraphobia: Secondary | ICD-10-CM

## 2020-03-07 DIAGNOSIS — F3176 Bipolar disorder, in full remission, most recent episode depressed: Secondary | ICD-10-CM

## 2020-03-07 DIAGNOSIS — F4310 Post-traumatic stress disorder, unspecified: Secondary | ICD-10-CM

## 2020-03-07 MED ORDER — IRON SUCROSE 20 MG/ML IV SOLN
200.0000 mg | Freq: Once | INTRAVENOUS | Status: AC
  Administered 2020-03-07: 200 mg via INTRAVENOUS
  Filled 2020-03-07: qty 10

## 2020-03-07 MED ORDER — SODIUM CHLORIDE 0.9 % IV SOLN
Freq: Once | INTRAVENOUS | Status: AC
  Filled 2020-03-07: qty 250

## 2020-03-07 NOTE — Assessment & Plan Note (Signed)
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  On IV iron.  Positive fatigue.  # Proceed with Venofer today.  Hemoglobin 14; saturation 20%; continue maintenance iron infusion. .   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  Stable  # Mild intermittent thrombocytopenia-159- normal.   # DISPOSITION:  #  VENOFER TODAY # follow up in 6 months MD- labs-1-2 days PRIOR cbc/b12/ iron studies/ferrtin possible VENOFER- Dr.B

## 2020-03-07 NOTE — Progress Notes (Signed)
[place] CONSULT NOTE  Patient Care Team: Olin Hauser, DO as PCP - General (Family Medicine)  CHIEF COMPLAINTS/PURPOSE OF CONSULTATION: Iron deficiency   HEMATOLOGY HISTORY  # ANEMIA IRON DEFICIENCY GASTRIC BYPASS [2005 [place], [place]] EGD-none; colonoscopy-2015- few polyps snared [[place]]; IV venofer x3; fall 2020/ PO B12 [intol to SL b12]   HISTORY OF PRESENTING ILLNESS:  Ashley Fields 65 y.o.  female history of iron deficiency secondary to gastric bypass is here for follow-up.  Denies any nausea vomiting.  Admits to ongoing fatigue.  No blood in stools no black or stools.  Review of Systems  Constitutional: Positive for malaise/fatigue. Negative for chills, diaphoresis, fever and weight loss.  HENT: Negative for nosebleeds and sore throat.   Eyes: Negative for double vision.  Respiratory: Negative for cough, hemoptysis, sputum production and wheezing.   Cardiovascular: Negative for chest pain, palpitations, orthopnea and leg swelling.  Gastrointestinal: Negative for abdominal pain, blood in stool, constipation, diarrhea, heartburn, melena, nausea and vomiting.  Genitourinary: Negative for dysuria, frequency and urgency.  Musculoskeletal: Negative for back pain and joint pain.  Skin: Negative.  Negative for itching and rash.  Neurological: Negative for dizziness, tingling, focal weakness, weakness and headaches.  Endo/Heme/Allergies: Does not bruise/bleed easily.  Psychiatric/Behavioral: Negative for depression. The patient is not nervous/anxious and does not have insomnia.     MEDICAL HISTORY:  Past Medical History:  Diagnosis Date  . Allergy   . Anemia   . Anxiety   . Arthritis    Yes  . Blood transfusion without reported diagnosis 1977   Transfusions from miscarriage & hemorrhaging  . Chronic kidney disease   . Depression   . Disp fx of cuboid bone of right foot, init for clos fx 01/01/2017  . GERD (gastroesophageal reflux disease)   .  Headache   . Hepatitis C   . Hyperlipidemia   . Neuromuscular disorder ([place]) 1997   Falling to much was an eye-opener  . Osteoporosis   . Thyroid disease   . Urine incontinence     SURGICAL HISTORY: Past Surgical History:  Procedure Laterality Date  . 5 miscarriages     1977-1985, Blood transfusion s/p miscarriage 1977  . ABDOMINAL HYSTERECTOMY  1987   Total  . APPENDECTOMY  12/2008  . AUGMENTATION MAMMAPLASTY Bilateral 2015   Bilat  . AUGMENTATION MAMMAPLASTY Bilateral 2018   implants redone w/ placement of implants under muscle  . breast lift bilateral, implants  45/80/9983   bilateral, silicon naturel  . BREAST REDUCTION SURGERY Bilateral 1997  . CESAREAN SECTION  07/27/1981   Placenta Previa  . CHOLECYSTECTOMY  03/2004   Lap surgery  . COSMETIC SURGERY     Breast implants w/ lift, tummy tuck, upper & lower Blepharop  . EYE SURGERY  2017   Cataract surgery & lasik  . GASTRIC BYPASS  2005   Laparoscopic  . IVC FILTER INSERTION  12/2003   TrapEase Vena Cava Filter  . LUMBAR LAMINECTOMY  06/2006   L4-L5 (spinal fusion)  . mini tummy tuck  05/07/2016   Bilateral bra/back roll lift skin removal  . neck surgery C3-C7  02/10/2015   ACDF  . REDUCTION MAMMAPLASTY Bilateral 1997  . SHOULDER ACROMIOPLASTY Left 03/27/2013   w/ labral debridement  . SMALL INTESTINE SURGERY  12/24/2003   Lap Gastric Bypass  . SPINAL CORD STIMULATOR IMPLANT  11/2010   removed 05/2011  . SPINE SURGERY  2008 2017   Fusion L4-L5, ACDF C4-C7  SOCIAL HISTORY: Social History   Socioeconomic History  . Marital status: Married    Spouse name: Not on file  . Number of children: 1  . Years of education: [employer]  . Highest education level: Some college, no degree  Occupational History  . Not on file  Tobacco Use  . Smoking status: Former Smoker    Packs/day: 0.25    Years: 10.00    Pack years: 2.50    Types: Cigarettes    Quit date: 11/29/1995    Years since quitting: 24.2  .  Smokeless tobacco: Former Systems developer  . Tobacco comment: Quite 1997, didnt smoke hardly at all when I was smoking.  Vaping Use  . Vaping Use: Never used  Substance and Sexual Activity  . Alcohol use: No  . Drug use: No  . Sexual activity: Yes    Birth control/protection: Condom, Post-menopausal, Surgical    Comment: Total Hysterectomy condoms  Other Topics Concern  . 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 COPD Mother   . Lung cancer Mother   . Diabetes Mother   . Heart disease Mother   . Stroke Mother   . Heart attack Mother   . Arthritis Mother   . Cancer Mother   . Hypertension Mother   . Obesity Mother   . Varicose Veins Mother   . Heart disease Father   . Heart attack Father 82  . Early death Father   . Depression Sister        x 5 sisters  . COPD Sister   . Hypertension Sister   . Heart disease Brother        x 3 brothers  . Diabetes Brother   . Colon polyps Sister   . Hearing loss Sister   . Heart attack Sister   . Heart attack Brother   . Arthritis Sister   . Varicose Veins Sister   . Arthritis Brother   . Heart disease Brother   . Hypertension Brother   . Cancer Maternal Grandfather   . Stroke Maternal Grandfather   . COPD Sister   . Obesity Sister   . Depression Sister   . Depression Sister   . Heart disease Sister   . Hypertension Sister   . Diabetes Brother   . Heart disease Brother   . Hypertension Brother   . Obesity Brother   . Varicose Veins Brother   . Heart disease Brother   . Hypertension Brother   .  Obesity Brother   . Hypertension Sister   . Obesity Sister   . Miscarriages / Stillbirths Maternal Aunt   . Miscarriages / Stillbirths Paternal Aunt   . Breast cancer Neg Hx     ALLERGIES:  is allergic to flagyl [metronidazole], cymbalta [duloxetine hcl], and tape.  MEDICATIONS:  Current Outpatient Medications  Medication Sig Dispense Refill  . albuterol (VENTOLIN HFA) 108 (90 Base) MCG/ACT inhaler Inhale 2 puffs into the lungs every 4 (four) hours as needed for wheezing or shortness of breath (cough).  8 g 2  . ARIPiprazole (ABILIFY) 10 MG tablet TAKE ONE TABLET BY MOUTH DAILY 90 tablet 0  . butalbital-acetaminophen-caffeine (FIORICET) 50-325-40 MG tablet Take 1 tablet by mouth 2 (two) times daily as needed for headache.    . [facility] ASPIRIN LOW DOSE 81 MG EC tablet TAKE 1 TABLET BY MOUTH EVERY DAY 90 tablet 3  . cyanocobalamin (,VITAMIN B-12,) 1000 MCG/ML injection Inject 1,000 mcg into the muscle every 30 (thirty) days.    . diclofenac Sodium (VOLTAREN) 1 % GEL Apply 2 g topically 4 (four) times daily.    Marland Kitchen estradiol (ESTRACE) 1 MG tablet TAKE ONE TABLET BY MOUTH DAILY 90 tablet 0  . etodolac (LODINE) 500 MG tablet TAKE 1 TABLET BY MOUTH TWICE A DAY 180 tablet 1  . ezetimibe (ZETIA) 10 MG tablet TAKE ONE TABLET BY MOUTH DAILY 90 tablet 0  . fluconazole (DIFLUCAN) 150 MG tablet Take one tablet by mouth on Day 1. Repeat dose 2nd tablet on Day 3. 2 tablet 0  . fluticasone (FLONASE) 50 MCG/ACT nasal spray SPRAY 2 SPRAYS INTO EACH NOSTRIL EVERY DAY 48 mL 3  . furosemide (LASIX) 20 MG tablet Take 1 tablet (20 mg total) by mouth daily as needed for fluid or edema. 90 tablet 3  . gabapentin (NEURONTIN) 600 MG tablet Take 1 tablet (600 mg total) by mouth 3 (three) times daily. 90 tablet 3  . hydrOXYzine (ATARAX/VISTARIL) 25 MG tablet TAKE 0.5-1 TABLETS (12.5-25 MG TOTAL) BY MOUTH 2 (TWO) TIMES DAILY AS NEEDED FOR ANXIETY. FOR SEVERE PANIC ATTACKS ONLY 180 tablet 1  . Melatonin 10 MG CAPS Take 2  capsules by mouth at bedtime.     . Multiple Vitamin (MULTIVITAMIN WITH MINERALS) TABS tablet Take 1 tablet by mouth daily.    Marland Kitchen omeprazole (PRILOSEC) 40 MG capsule TAKE 1 CAPSULE BY MOUTH EVERY DAY 90 capsule 2  . ondansetron (ZOFRAN ODT) 4 MG disintegrating tablet Take 1 tablet (4 mg total) by mouth every 8 (eight) hours as needed for nausea or vomiting. 30 tablet 2  . OZEMPIC, 1 MG/DOSE, 4 MG/3ML SOPN INJECT 1 MG INTO THE SKIN ONCE A WEEK. 3 mL 1  . phentermine (ADIPEX-P) 37.5 MG tablet Take 37.5 mg by mouth at bedtime.    . propranolol (INDERAL) 10 MG tablet TAKE ONE TABLET BY MOUTH THREE TIMES DAILY AS NEEDED for severe anxiety attacks 270 tablet 0  . rosuvastatin (CRESTOR) 40 MG tablet Take 1 tablet (40 mg total) by mouth daily. 90 tablet 3  . tiZANidine (ZANAFLEX) 4 MG tablet Take 4 mg by mouth 4 (four) times daily.    . traMADol (ULTRAM) 50 MG tablet Take 50 mg by mouth 3 (three) times daily as needed.    . traZODone (DESYREL) 100 MG tablet Take 1 tablet by mouth at bedtime for sleep 90 tablet 2  . venlafaxine XR (EFFEXOR XR) 37.5 MG 24 hr capsule Take 1 capsule (37.5 mg total) by mouth daily with breakfast. To be combined with 75 mg daily 90 capsule 0  . venlafaxine XR (EFFEXOR-XR) 75 MG 24 hr capsule TAKE 1 CAPSULE (75 MG TOTAL) BY MOUTH DAILY WITH BREAKFAST. 30 capsule 5  . XIIDRA 5 % SOLN INSTILL 1 DROP INTO BOTH EYES TWICE A DAY     No current facility-administered medications for this visit.      PHYSICAL EXAMINATION:   Vitals:   03/07/20 1315  BP: 132/80  Pulse: 86  Resp: 18  Temp: 97.8 F (36.6 C)  There were no vitals filed for this visit.  Physical Exam HENT:     Head: Normocephalic and atraumatic.     Mouth/Throat:     Pharynx: No oropharyngeal exudate.  Eyes:     Pupils: Pupils are equal, round, and reactive to light.  Cardiovascular:     Rate and Rhythm: Normal rate and regular rhythm.  Pulmonary:     Effort: Pulmonary effort is normal. No respiratory  distress.     Breath sounds: Normal breath sounds. No wheezing.  Abdominal:     General: Bowel sounds are normal. There is no distension.     Palpations: Abdomen is soft. There is no mass.     Tenderness: There is no abdominal tenderness. There is no guarding or rebound.  Musculoskeletal:        General: No tenderness. Normal range of motion.     Cervical back: Normal range of motion and neck supple.  Skin:    General: Skin is warm.  Neurological:     Mental Status: She is alert and oriented to person, place, and time.  Psychiatric:        Mood and Affect: Affect normal.     LABORATORY DATA:  I have reviewed the data as listed Lab Results  Component Value Date   WBC 5.3 03/03/2020   HGB 14.0 03/03/2020   HCT 42.8 03/03/2020   MCV 91.3 03/03/2020   PLT 159 03/03/2020   Recent Labs    03/17/19 0908 09/22/19 0802 12/28/19 0836  NA 143 142 142  K 4.6 4.9 4.1  CL 106 106 101  CO2 33* 29 27  GLUCOSE 95 94 80  BUN 13 13 18   CREATININE 1.03* 0.89 1.01*  CALCIUM 9.2 9.3 9.4  GFRNONAA 58* 69 59*  GFRAA 67 80 69  PROT 6.8 6.7  --   AST 22 34  --   ALT 20 30*  --   BILITOT 0.6 0.9  --      No results found.  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  On IV iron.  Positive fatigue.  # Proceed with Venofer today.  Hemoglobin 14; saturation 20%; continue maintenance iron infusion. .   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  Stable  # Mild intermittent thrombocytopenia-159- normal.   # DISPOSITION:  #  VENOFER TODAY # follow up in 6 months MD- labs-1-2 days PRIOR cbc/b12/ iron studies/ferrtin possible VENOFER- Dr.B  All questions were answered. The patient knows to call the clinic with any problems, questions or concerns.    Cammie Sickle, MD 03/07/2020 7:07 PM

## 2020-03-08 ENCOUNTER — Telehealth (INDEPENDENT_AMBULATORY_CARE_PROVIDER_SITE_OTHER): Payer: 59 | Admitting: Psychiatry

## 2020-03-08 ENCOUNTER — Encounter: Payer: Self-pay | Admitting: Psychiatry

## 2020-03-08 DIAGNOSIS — F3161 Bipolar disorder, current episode mixed, mild: Secondary | ICD-10-CM | POA: Diagnosis not present

## 2020-03-08 DIAGNOSIS — F410 Panic disorder [episodic paroxysmal anxiety] without agoraphobia: Secondary | ICD-10-CM | POA: Diagnosis not present

## 2020-03-08 DIAGNOSIS — F4310 Post-traumatic stress disorder, unspecified: Secondary | ICD-10-CM | POA: Diagnosis not present

## 2020-03-08 DIAGNOSIS — G4701 Insomnia due to medical condition: Secondary | ICD-10-CM | POA: Diagnosis not present

## 2020-03-08 MED ORDER — ZOLPIDEM TARTRATE 5 MG PO TABS: 5.0000 mg | ORAL_TABLET | Freq: Every evening | ORAL | 1 refills | Status: DC | PRN

## 2020-03-08 MED ORDER — ARIPIPRAZOLE 15 MG PO TABS: 15.0000 mg | ORAL_TABLET | Freq: Every day | ORAL | 1 refills | Status: DC

## 2020-03-08 NOTE — Patient Instructions (Signed)
[facility],  phone - [phone]

## 2020-03-08 NOTE — Progress Notes (Addendum)
Virtual Visit via Video Note  I connected with Ashley Fields on 03/08/20 at 10:40 AM EST by a video enabled telemedicine application and verified that I am speaking with the correct person using two identifiers.  Location Provider Location : [facility] Patient Location : Home  Participants: Patient , Provider   I discussed the limitations of evaluation and management by telemedicine and the availability of in person appointments. The patient expressed understanding and agreed to proceed   I discussed the assessment and treatment plan with the patient. The patient was provided an opportunity to ask questions and all were answered. The patient agreed with the plan and demonstrated an understanding of the instructions.   The patient was advised to call back or seek an in-person evaluation if the symptoms worsen or if the condition fails to improve as anticipated.  [place] MD OP Progress Note  03/08/2020 9:30 PM Ashley Fields  MRN:  [id number]  Chief Complaint:  Chief Complaint    Follow-up     HPI: Ashley Fields is a 65 year old Caucasian female, married, on disability, lives in [facility], has a history of bipolar disorder, PTSD, chronic pain, migraine headaches, insomnia was evaluated by telemedicine today.  Patient today reports her sister recently had a heart attack and had stent placement.  She reports ever since then she has noticed her mood symptoms as getting worse.  She reports she has manic symptoms of irritability, anxiety, agitation, going on shopping sprees like recently bought  20 chicks and a pen which she really did not need, decreased need for sleep and so on.  This has been getting worse since the past 2 weeks.  She also reports feeling sad on and off.  She is compliant on her medications as prescribed.  She denies any suicidality, homicidality or perceptual disturbances.  She reports she is currently trying to watch her diet and exercise .She has been trying to walk 10,000 steps every  day.  She is interested in psychotherapy Fields however reports her health insurance plan will not pay for seeing a therapist.  Patient denies any other concerns.  Visit Diagnosis:    ICD-10-CM   1. Bipolar 1 disorder, mixed, mild (HCC)  F31.61 ARIPiprazole (ABILIFY) 15 MG tablet    zolpidem (AMBIEN) 5 MG tablet  2. PTSD (post-traumatic stress disorder)  F43.10   3. Panic attacks  F41.0   4. Insomnia due to medical condition  G47.01 zolpidem (AMBIEN) 5 MG tablet   Mood , pain    Past Psychiatric History: I have reviewed past psychiatric history from my progress note on 05/15/2018.  Past trials of Prozac, Wellbutrin, Lexapro, Cymbalta, Rexulti  Past Medical History:  Past Medical History:  Diagnosis Date  . Allergy   . Anemia   . Anxiety   . Arthritis    Yes  . Blood transfusion without reported diagnosis 1977   Transfusions from miscarriage & hemorrhaging  . Chronic kidney disease   . Depression   . Disp fx of cuboid bone of right foot, init for clos fx 01/01/2017  . GERD (gastroesophageal reflux disease)   . Headache   . Hepatitis C   . Hyperlipidemia   . Neuromuscular disorder ([place]) 1997   Falling to much was an eye-opener  . Osteoporosis   . Thyroid disease   . Urine incontinence     Past Surgical History:  Procedure Laterality Date  . 5 miscarriages     1977-1985, Blood transfusion s/p miscarriage 1977  . ABDOMINAL HYSTERECTOMY  1987   Total  . APPENDECTOMY  12/2008  . AUGMENTATION MAMMAPLASTY Bilateral 2015   Bilat  . AUGMENTATION MAMMAPLASTY Bilateral 2018   implants redone w/ placement of implants under muscle  . breast lift bilateral, implants  40/98/1191   bilateral, silicon naturel  . BREAST REDUCTION SURGERY Bilateral 1997  . CESAREAN SECTION  07/27/1981   Placenta Previa  . CHOLECYSTECTOMY  03/2004   Lap surgery  . COSMETIC SURGERY     Breast implants w/ lift, tummy tuck, upper & lower Blepharop  . EYE SURGERY  2017   Cataract surgery & lasik   . GASTRIC BYPASS  2005   Laparoscopic  . IVC FILTER INSERTION  12/2003   TrapEase Vena Cava Filter  . LUMBAR LAMINECTOMY  06/2006   L4-L5 (spinal fusion)  . mini tummy tuck  05/07/2016   Bilateral bra/back roll lift skin removal  . neck surgery C3-C7  02/10/2015   ACDF  . REDUCTION MAMMAPLASTY Bilateral 1997  . SHOULDER ACROMIOPLASTY Left 03/27/2013   w/ labral debridement  . SMALL INTESTINE SURGERY  12/24/2003   Lap Gastric Bypass  . SPINAL CORD STIMULATOR IMPLANT  11/2010   removed 05/2011  . SPINE SURGERY  2008 2017   Fusion L4-L5, ACDF C4-C7    Family Psychiatric History: I have reviewed family psychiatric history from my progress note on 05/15/2018  Family History:  Family History  Problem Relation Age of Onset  . COPD Mother   . Lung cancer Mother   . Diabetes Mother   . Heart disease Mother   . Stroke Mother   . Heart attack Mother   . Arthritis Mother   . Cancer Mother   . Hypertension Mother   . Obesity Mother   . Varicose Veins Mother   . Heart disease Father   . Heart attack Father 58  . Early death Father   . Depression Sister        x 5 sisters  . COPD Sister   . Hypertension Sister   . Heart disease Brother        x 3 brothers  . Diabetes Brother   . Colon polyps Sister   . Hearing loss Sister   . Heart attack Sister   . Heart attack Brother   . Arthritis Sister   . Varicose Veins Sister   . Arthritis Brother   . Heart disease Brother   . Hypertension Brother   . Cancer Maternal Grandfather   . Stroke Maternal Grandfather   . COPD Sister   . Obesity Sister   . Depression Sister   . Depression Sister   . Heart disease Sister   . Hypertension Sister   . Diabetes Brother   . Heart disease Brother   . Hypertension Brother   . Obesity Brother   . Varicose Veins Brother   . Heart disease Brother   . Hypertension Brother   . Obesity Brother   . Hypertension Sister   . Obesity Sister   . Miscarriages / Stillbirths Maternal Aunt   .  Miscarriages / Stillbirths Paternal Aunt   . Breast cancer Neg Hx     Social History: I have reviewed social history from my progress note on 05/15/2018 Social History   Socioeconomic History  . Marital status: Married    Spouse name: Not on file  . Number of children: 1  . Years of education: [employer]  . Highest education level: Some college, no degree  Occupational History  .  Not on file  Tobacco Use  . Smoking status: Former Smoker    Packs/day: 0.25    Years: 10.00    Pack years: 2.50    Types: Cigarettes    Quit date: 11/29/1995    Years since quitting: 24.2  . Smokeless tobacco: Former Systems developer  . Tobacco comment: Quite 1997, didnt smoke hardly at all when I was smoking.  Vaping Use  . Vaping Use: Never used  Substance and Sexual Activity  . Alcohol use: No  . Drug use: No  . Sexual activity: Yes    Birth control/protection: Condom, Post-menopausal, Surgical    Comment: Total Hysterectomy condoms  Other Topics Concern  . 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 Flagyl [Metronidazole] Anaphylaxis  . Cymbalta [Duloxetine Hcl]   . Tape Rash    Metabolic Disorder Labs: Lab Results  Component Value Date   HGBA1C 5.0 03/17/2019   MPG 97 03/17/2019   MPG 103 08/11/2018   No results found for: PROLACTIN Lab Results  Component Value Date   CHOL 145 09/22/2019   TRIG 72 09/22/2019   HDL 88 09/22/2019   CHOLHDL 1.6 09/22/2019   LDLCALC 42 09/22/2019   LDLCALC 53 03/17/2019   Lab Results  Component Value Date   TSH 0.86 08/11/2018   TSH 1.88 07/30/2017    Therapeutic Level  Labs: No results found for: LITHIUM No results found for: VALPROATE No components found for:  CBMZ  Current Medications: Current Outpatient Medications  Medication Sig Dispense Refill  . ARIPiprazole (ABILIFY) 15 MG tablet Take 1 tablet (15 mg total) by mouth daily. 30 tablet 1  . zolpidem (AMBIEN) 5 MG tablet Take 1 tablet (5 mg total) by mouth at bedtime as needed for sleep. 15 tablet 1  . albuterol (VENTOLIN HFA) 108 (90 Base) MCG/ACT inhaler Inhale 2 puffs into the lungs every 4 (four) hours as needed for wheezing or shortness of breath (cough). 8 g 2  . butalbital-acetaminophen-caffeine (FIORICET) 50-325-40 MG tablet Take 1 tablet by mouth 2 (two) times daily as needed for headache.    . [facility] ASPIRIN LOW DOSE 81 MG EC tablet TAKE 1 TABLET BY MOUTH EVERY DAY 90 tablet 3  . cyanocobalamin (,VITAMIN B-12,) 1000 MCG/ML injection Inject 1,000 mcg into the muscle every 30 (thirty) days.    . diclofenac Sodium (VOLTAREN) 1 % GEL Apply 2 g topically 4 (four) times daily.    Marland Kitchen estradiol (ESTRACE) 1 MG tablet TAKE ONE TABLET BY MOUTH DAILY 90 tablet 0  . etodolac (LODINE) 500 MG tablet TAKE 1 TABLET BY MOUTH TWICE A DAY 180 tablet 1  . ezetimibe (ZETIA) 10 MG tablet TAKE ONE TABLET BY MOUTH DAILY 90 tablet 0  . fluconazole (DIFLUCAN) 150 MG tablet Take one tablet by mouth on Day 1. Repeat dose 2nd tablet on Day 3. 2 tablet 0  . fluticasone (FLONASE) 50 MCG/ACT nasal spray SPRAY 2 SPRAYS INTO EACH NOSTRIL EVERY DAY 48 mL 3  . furosemide (LASIX) 20 MG tablet Take 1 tablet (20 mg total) by mouth daily as needed for fluid or  edema. 90 tablet 3  . gabapentin (NEURONTIN) 600 MG tablet Take 1 tablet (600 mg total) by mouth 3 (three) times daily. 90 tablet 3  . hydrOXYzine (ATARAX/VISTARIL) 25 MG tablet TAKE 0.5-1 TABLETS (12.5-25 MG TOTAL) BY MOUTH 2 (TWO) TIMES DAILY AS NEEDED FOR ANXIETY. FOR SEVERE PANIC ATTACKS ONLY 180 tablet 1  . Multiple Vitamin (MULTIVITAMIN WITH MINERALS) TABS tablet Take 1 tablet  by mouth daily.    Marland Kitchen omeprazole (PRILOSEC) 40 MG capsule TAKE 1 CAPSULE BY MOUTH EVERY DAY 90 capsule 2  . ondansetron (ZOFRAN ODT) 4 MG disintegrating tablet Take 1 tablet (4 mg total) by mouth every 8 (eight) hours as needed for nausea or vomiting. 30 tablet 2  . OZEMPIC, 1 MG/DOSE, 4 MG/3ML SOPN INJECT 1 MG INTO THE SKIN ONCE A WEEK. 3 mL 1  . phentermine (ADIPEX-P) 37.5 MG tablet Take 37.5 mg by mouth at bedtime.    . propranolol (INDERAL) 10 MG tablet TAKE ONE TABLET BY MOUTH THREE TIMES DAILY AS NEEDED for severe anxiety attacks 270 tablet 0  . rosuvastatin (CRESTOR) 40 MG tablet Take 1 tablet (40 mg total) by mouth daily. 90 tablet 3  . tiZANidine (ZANAFLEX) 4 MG tablet Take 4 mg by mouth 4 (four) times daily.    . traMADol (ULTRAM) 50 MG tablet Take 50 mg by mouth 3 (three) times daily as needed.    . venlafaxine XR (EFFEXOR XR) 37.5 MG 24 hr capsule Take 1 capsule (37.5 mg total) by mouth daily with breakfast. To be combined with 75 mg daily 90 capsule 0  . venlafaxine XR (EFFEXOR-XR) 75 MG 24 hr capsule TAKE 1 CAPSULE (75 MG TOTAL) BY MOUTH DAILY WITH BREAKFAST. 30 capsule 5  . XIIDRA 5 % SOLN INSTILL 1 DROP INTO BOTH EYES TWICE A DAY     No current facility-administered medications for this visit.     Musculoskeletal: Strength & Muscle Tone: UTA Gait & Station: UTA Patient leans: N/A  Psychiatric Specialty Exam: Review of Systems  Psychiatric/Behavioral: Positive for dysphoric mood and sleep disturbance. The patient is nervous/anxious.        Irritable  All other systems reviewed and are negative.   There were no vitals taken for this visit.There is no height or weight on file to calculate BMI.  General Appearance: Casual  Eye Contact:  Fair  Speech:  Clear and Coherent  Volume:  Normal  Mood:  Anxious and Irritable  Affect:  Congruent  Thought Process:  Goal Directed and Descriptions of Associations: Intact  Orientation:  Full (Time, Place, and Person)  Thought  Content: Logical   Suicidal Thoughts:  No  Homicidal Thoughts:  No  Memory:  Immediate;   Fair Recent;   Fair Remote;   Fair  Judgement:  Fair  Insight:  Fair  Psychomotor Activity:  Normal  Concentration:  Concentration: Fair and Attention Span: Fair  Recall:  [employer] of Knowledge: Fair  Language: Fair  Akathisia:  No  Handed:  Right  AIMS (if indicated): UTA  Assets:  Communication Skills Desire for Improvement Housing Social Support  ADL's:  Intact  Cognition: WNL  Sleep:  Poor   Screenings: GAD-7   Flowsheet Row Office Visit from 12/28/2019 in [facility] Office Visit from 03/17/2019 in [facility] Office Visit from 11/20/2018 in [facility] Office Visit from 08/18/2018 in [facility] Office Visit from 07/22/2018 in [facility]  Total GAD-7 Score 2  2 4 9 8     PHQ2-9   [place] Office Visit from 12/28/2019 in [facility] Office Visit from 09/21/2019 in [facility] Office Visit from 06/18/2019 in [facility] Office Visit from 03/17/2019 in [facility] Office Visit from 12/16/2018 in [place]  PHQ-2 Total Score 0 0 0 0 0  PHQ-9 Total Score 1 0 -- 2 --       Assessment and Plan: Ashley Fields is a 65 year old Caucasian female who has a history of PTSD, bipolar disorder, panic attacks, migraine headaches, chronic pain was evaluated by telemedicine today.  Patient is currently struggling with manic symptoms and will benefit from the following plan.  Plan Bipolar disorder mixed mild-unstable Increase Abilify to 15 mg p.o. daily Venlafaxine extended release 112.5 mg p.o. daily  PTSD-improving Venlafaxine as prescribed  Panic attacks-improving Venlafaxine 112.5 mg p.o. daily Hydroxyzine 25 mg as needed. Refer for CBT-provided information for [facility].  Insomnia-unstable Discontinue  trazodone for lack of benefit Discontinue melatonin Start Ambien 5 mg p.o. nightly as needed Hydroxyzine 25 mg p.o. nightly as needed  Follow-up in clinic in 2 to 3 weeks or sooner if needed.   I have spent at least 20 minutes with patient today.  More than 50% of the time was spent for preparing to see the patient  (e.g., review of test, records), ordering medications and test, psychoeducation and supportive psychotherapy, care coordination as well as documenting clinical information in electronic health record.   This note was generated in part or whole with voice recognition software. Voice recognition is usually quite accurate but there are transcription errors that can and very often do occur. I apologize for any typographical errors that were not detected and corrected.         Ursula Alert, MD 03/08/2020, 9:30 PM

## 2020-03-09 ENCOUNTER — Other Ambulatory Visit: Payer: Self-pay | Admitting: Family Medicine

## 2020-03-09 DIAGNOSIS — I25118 Atherosclerotic heart disease of native coronary artery with other forms of angina pectoris: Secondary | ICD-10-CM

## 2020-03-09 DIAGNOSIS — Z8249 Family history of ischemic heart disease and other diseases of the circulatory system: Secondary | ICD-10-CM

## 2020-03-09 NOTE — Telephone Encounter (Signed)
Requested Prescriptions  Pending Prescriptions Disp Refills  . OZEMPIC, 1 MG/DOSE, 4 MG/3ML SOPN [Pharmacy Med Name: OZEMPIC 1 MG/DOSE (4 MG/3 ML)]  1    Sig: INJECT 1 MG INTO THE SKIN ONCE A WEEK.     Endocrinology:  Diabetes - GLP-1 Receptor Agonists Failed - 03/09/2020  6:41 AM      Failed - HBA1C is between 0 and 7.9 and within 180 days    Hgb A1c MFr Bld  Date Value Ref Range Status  03/17/2019 5.0 <5.7 % of total Hgb Final    Comment:    For the purpose of screening for the presence of diabetes: . <5.7%       Consistent with the absence of diabetes 5.7-6.4%    Consistent with increased risk for diabetes             (prediabetes) > or =6.5%  Consistent with diabetes . This assay result is consistent with a decreased risk of diabetes. . Currently, no consensus exists regarding use of hemoglobin A1c for diagnosis of diabetes in children. . According to American Diabetes Association (ADA) guidelines, hemoglobin A1c <7.0% represents optimal control in non-pregnant diabetic patients. Different metrics may apply to specific patient populations.  Standards of Medical Care in Diabetes(ADA). Renella Cunas - Valid encounter within last 6 months    Recent Outpatient Visits          1 month ago Acute viral pharyngitis   [place], DO   2 months ago Morbid obesity [facility])   [place], DO   2 months ago Acute pain of left knee   [place], DO   3 months ago Hordeolum internum of right upper eyelid   [place], DO   5 months ago Morbid obesity [facility])   [facility] Parks Ranger, Devonne Doughty, DO      Future Appointments            In 4 weeks [place], Devonne Doughty, DO [facility], [facility]

## 2020-03-16 ENCOUNTER — Other Ambulatory Visit: Payer: Self-pay | Admitting: Psychiatry

## 2020-03-16 ENCOUNTER — Other Ambulatory Visit: Payer: Self-pay | Admitting: Family Medicine

## 2020-03-16 DIAGNOSIS — F4310 Post-traumatic stress disorder, unspecified: Secondary | ICD-10-CM

## 2020-03-16 DIAGNOSIS — F410 Panic disorder [episodic paroxysmal anxiety] without agoraphobia: Secondary | ICD-10-CM

## 2020-03-16 DIAGNOSIS — F3176 Bipolar disorder, in full remission, most recent episode depressed: Secondary | ICD-10-CM

## 2020-03-16 DIAGNOSIS — K219 Gastro-esophageal reflux disease without esophagitis: Secondary | ICD-10-CM

## 2020-03-16 NOTE — Telephone Encounter (Signed)
Requested Prescriptions  Pending Prescriptions Disp Refills  . omeprazole (PRILOSEC) 40 MG capsule [Pharmacy Med Name: OMEPRAZOLE DR 40 MG CAPSULE] 90 capsule 2    Sig: TAKE 1 CAPSULE BY MOUTH EVERY DAY     Gastroenterology: Proton Pump Inhibitors Passed - 03/16/2020  3:19 AM      Passed - Valid encounter within last 12 months    Recent Outpatient Visits          1 month ago Acute viral pharyngitis   [place], DO   2 months ago Morbid obesity [facility])   [place], DO   2 months ago Acute pain of left knee   [place], DO   3 months ago Hordeolum internum of right upper eyelid   [place], DO   5 months ago Morbid obesity [facility])   [facility] Parks Ranger, Devonne Doughty, DO      Future Appointments            In 3 weeks Parks Ranger, Devonne Doughty, DO [facility], [facility]

## 2020-03-25 ENCOUNTER — Other Ambulatory Visit: Payer: Self-pay | Admitting: Family Medicine

## 2020-03-25 ENCOUNTER — Other Ambulatory Visit: Payer: Self-pay | Admitting: Psychiatry

## 2020-03-25 DIAGNOSIS — E782 Mixed hyperlipidemia: Secondary | ICD-10-CM

## 2020-03-25 DIAGNOSIS — F3176 Bipolar disorder, in full remission, most recent episode depressed: Secondary | ICD-10-CM

## 2020-03-25 DIAGNOSIS — F4310 Post-traumatic stress disorder, unspecified: Secondary | ICD-10-CM

## 2020-03-25 DIAGNOSIS — F410 Panic disorder [episodic paroxysmal anxiety] without agoraphobia: Secondary | ICD-10-CM

## 2020-03-25 NOTE — Telephone Encounter (Signed)
Future visit in 4 days

## 2020-03-28 ENCOUNTER — Other Ambulatory Visit: Payer: Self-pay

## 2020-03-28 ENCOUNTER — Encounter: Payer: Self-pay | Admitting: Psychiatry

## 2020-03-28 ENCOUNTER — Telehealth (INDEPENDENT_AMBULATORY_CARE_PROVIDER_SITE_OTHER): Payer: 59 | Admitting: Psychiatry

## 2020-03-28 DIAGNOSIS — F3161 Bipolar disorder, current episode mixed, mild: Secondary | ICD-10-CM | POA: Diagnosis not present

## 2020-03-28 DIAGNOSIS — F4310 Post-traumatic stress disorder, unspecified: Secondary | ICD-10-CM

## 2020-03-28 DIAGNOSIS — G4701 Insomnia due to medical condition: Secondary | ICD-10-CM | POA: Diagnosis not present

## 2020-03-28 DIAGNOSIS — F410 Panic disorder [episodic paroxysmal anxiety] without agoraphobia: Secondary | ICD-10-CM | POA: Diagnosis not present

## 2020-03-28 MED ORDER — ARIPIPRAZOLE 10 MG PO TABS: 10.0000 mg | ORAL_TABLET | Freq: Every day | ORAL | 1 refills | Status: DC

## 2020-03-28 MED ORDER — LAMOTRIGINE 25 MG PO TABS: 25.0000 mg | ORAL_TABLET | Freq: Every day | ORAL | 0 refills | Status: DC

## 2020-03-28 NOTE — Patient Instructions (Signed)
Lamotrigine tablets What is this medicine? LAMOTRIGINE (la MOE tri jeen) is used to control seizures in adults and children with epilepsy and Lennox-Gastaut syndrome. It is also used in adults to treat bipolar disorder. This medicine may be used for other purposes; ask your health care provider or pharmacist if you have questions. COMMON BRAND NAME(S): Lamictal, Subvenite What should I tell my health care provider before I take this medicine? They need to know if you have any of these conditions:  heart disease  history of irregular heartbeat  immune system problems  kidney disease  liver disease  low levels of folic acid in the blood  lupus  mental illness  suicidal thoughts, plans, or attempt; a previous suicide attempt by you or a family member  an unusual or allergic reaction to lamotrigine or other seizure medications, other medicines, foods, dyes, or preservatives  pregnant or trying to get pregnant  breast-feeding How should I use this medicine? Take this medicine by mouth with a glass of water. Follow the directions on the prescription label. Do not chew these tablets. If this medicine upsets your stomach, take it with food or milk. Take your doses at regular intervals. Do not take your medicine more often than directed. A special MedGuide will be given to you by the pharmacist with each new prescription and refill. Be sure to read this information carefully each time. Talk to your pediatrician regarding the use of this medicine in children. While this drug may be prescribed for children as young as 2 years for selected conditions, precautions do apply. Overdosage: If you think you have taken too much of this medicine contact a poison control center or emergency room at once. NOTE: This medicine is only for you. Do not share this medicine with others. What if I miss a dose? If you miss a dose, take it as soon as you can. If it is almost time for your next dose, take only  that dose. Do not take double or extra doses. What may interact with this medicine?  atazanavir  birth control pills  certain medicines for irregular heartbeat  certain medicines for seizures like carbamazepine, phenobarbital, phenytoin, primidone, valproic acid  lopinavir  rifampin  ritonavir This list may not describe all possible interactions. Give your health care provider a list of all the medicines, herbs, non-prescription drugs, or dietary supplements you use. Also tell them if you smoke, drink alcohol, or use illegal drugs. Some items may interact with your medicine. What should I watch for while using this medicine? Visit your doctor or health care providerfor regular checks on your progress. If you take this medicine for seizures, wear a Medic Alert bracelet or necklace. Carry an identification card with information about your condition, medicines, and doctor or health care provider. It is important to take this medicine exactly as directed. When first starting treatment, your dose will need to be adjusted slowly. It may take weeks or months before your dose is stable. You should contact your doctor or health care providerif your seizures get worse or if you have any new types of seizures. Do not stop taking this medicine unless instructed by your doctor or health care provider. Stopping your medicine suddenly can increase your seizures or their severity. This medicine may cause serious skin reactions. They can happen weeks to months after starting the medicine. Contact your health care provider right away if you notice fevers or flu-like symptoms with a rash. The rash may be red or purple   and then turn into blisters or peeling of the skin. Or, you might notice a red rash with swelling of the face, lips or lymph nodes in your neck or under your arms. You may get drowsy, dizzy, or have blurred vision. Do not drive, use machinery, or do anything that needs mental alertness until you know  how this medicine affects you. To reduce dizzy or fainting spells, do not sit or stand up quickly, especially if you are an older patient. Alcohol can increase drowsiness and dizziness. Avoid alcoholic drinks. If you are taking this medicine for bipolar disorder, it is important to report any changes in your mood to your doctor or health care provider. If your condition gets worse, you get mentally depressed, feel very hyperactive or manic, have difficulty sleeping, or have thoughts of hurting yourself or committing suicide, you need to get help from your health care providerright away. If you are a caregiver for someone taking this medicine for bipolar disorder, you should also report these behavioral changes right away. The use of this medicine may increase the chance of suicidal thoughts or actions. Pay special attention to how you are responding while on this medicine. Your mouth may get dry. Chewing sugarless gum or sucking hard candy, and drinking plenty of water may help. Contact your doctor if the problem does not go away or is severe. Women who become pregnant while using this medicine may enroll in the [place] Antiepileptic Drug Pregnancy Registry by calling 1-888-233-2334. This registry collects information about the safety of antiepileptic drug use during pregnancy. This medicine may cause a decrease in folic acid. You should make sure that you get enough folic acid while you are taking this medicine. Discuss the foods you eat and the vitamins you take with your health care provider. What side effects may I notice from receiving this medicine? Side effects that you should report to your doctor or health care professional as soon as possible:  allergic reactions (skin rash, itching or hives; swelling of the face, lips, or tongue)  aseptic meningitis (stiff neck; sensitivity to light; headache; drowsiness; fever; nausea, vomiting; rash)  changes in vision  heartbeat rhythm changes  (trouble breathing; chest pain; dizziness; fast, irregular heartbeat; feeling faint or lightheaded, falls)  infection (fever, chills, cough, sore throat, pain or trouble passing urine)  liver injury (dark yellow or brown urine; general ill feeling or flu-like symptoms; loss of appetite, right upper belly pain; unusually weak or tired, yellowing of the eyes or skin)  low red blood cell counts (trouble breathing; feeling faint; lightheaded, falls; unusually weak or tired)  rash, fever, and swollen lymph nodes  redness, blistering, peeling, or loosening of the skin, including inside the mouth  seizures  suicidal thoughts, mood changes  unusual bruising or bleeding Side effects that usually do not require medical attention (report to your doctor or health care professional if they continue or are bothersome):  diarrhea  dizziness  nausea  stomach pain  tremors  vomiting This list may not describe all possible side effects. Call your doctor for medical advice about side effects. You may report side effects to FDA at 1-800-FDA-1088. Where should I keep my medicine? Keep out of the reach of children and pets. Store at 25 degrees C (77 degrees F). Protect from light. Get rid of any unused medicine after the expiration date. To get rid of medicines that are no longer needed or have expired:  Take the medicine to a medicine take-back program. Check   with your pharmacy or law enforcement to find a location.  If you cannot return the medicine, check the label or package insert to see if the medicine should be thrown out in the garbage or flushed down the toilet. If you are not sure, ask your health care provider. If it is safe to put it in the trash, empty the medicine out of the container. Mix the medicine with cat litter, dirt, coffee grounds, or other unwanted substance. Seal the mixture in a bag or container. Put it in the trash. NOTE: This sheet is a summary. It may not cover all  possible information. If you have questions about this medicine, talk to your doctor, pharmacist, or health care provider.  2021 Elsevier/Gold Standard (2019-04-09 10:26:40)  

## 2020-03-28 NOTE — Progress Notes (Signed)
Virtual Visit via Video Note  I connected with Ashley Fields on 03/28/20 at 10:40 AM EDT by a video enabled telemedicine application and verified that I am speaking with the correct person using two identifiers.  Location Provider Location : [facility] Patient Location : Car  Participants: Patient , Provider   I discussed the limitations of evaluation and management by telemedicine and the availability of in person appointments. The patient expressed understanding and agreed to proceed.   I discussed the assessment and treatment plan with the patient. The patient was provided an opportunity to ask questions and all were answered. The patient agreed with the plan and demonstrated an understanding of the instructions.   The patient was advised to call back or seek an in-person evaluation if the symptoms worsen or if the condition fails to improve as anticipated.  Parker MD OP Progress Note  03/28/2020 12:13 PM Ashley Fields  MRN:  [id number]  Chief Complaint:  Chief Complaint    Follow-up; Depression     HPI: Ashley Fields is a 65 year old Caucasian female, married, on disability, lives in [facility], has a history of bipolar disorder, PTSD, chronic pain, migraine headaches, insomnia was evaluated by telemedicine today.  Patient today reports she continues to struggle with irritability, mood swings.  She reports this was brought to her attention by her husband.  She reports she also reports she cannot feel any emotions on the current medication regimen.  Her husband also feels the same.  She is currently on the higher dosage of Abilify which has not helped much.  Patient reports she stopped taking the Ambien since it gave her nightmares.  She went back to trazodone and melatonin combination and she has been able to sleep better.  She wants to stay on it at this time.  Patient reports her sister is currently going through health problems and she is trying to help out.  Their relationship has  improved.  Patient denies any suicidality, homicidality or perceptual disturbances.  Patient denies any other concerns today.  Visit Diagnosis:    ICD-10-CM   1. Bipolar 1 disorder, mixed, mild (HCC)  F31.61 ARIPiprazole (ABILIFY) 10 MG tablet    lamoTRIgine (LAMICTAL) 25 MG tablet  2. PTSD (post-traumatic stress disorder)  F43.10 ARIPiprazole (ABILIFY) 10 MG tablet  3. Panic attacks  F41.0 ARIPiprazole (ABILIFY) 10 MG tablet  4. Insomnia due to medical condition  G47.01    mood ,pain    Past Psychiatric History: I have reviewed past psychiatric history from my progress note on 05/15/2018.  Past trials of Prozac, Wellbutrin, Lexapro, Cymbalta, Rexulti, Ambien-nightmares  Past Medical History:  Past Medical History:  Diagnosis Date  . Allergy   . Anemia   . Anxiety   . Arthritis    Yes  . Blood transfusion without reported diagnosis 1977   Transfusions from miscarriage & hemorrhaging  . Chronic kidney disease   . Depression   . Disp fx of cuboid bone of right foot, init for clos fx 01/01/2017  . GERD (gastroesophageal reflux disease)   . Headache   . Hepatitis C   . Hyperlipidemia   . Neuromuscular disorder ([place]) 1997   Falling to much was an eye-opener  . Osteoporosis   . Thyroid disease   . Urine incontinence     Past Surgical History:  Procedure Laterality Date  . 5 miscarriages     1977-1985, Blood transfusion s/p miscarriage 1977  . ABDOMINAL HYSTERECTOMY  1987   Total  . APPENDECTOMY  12/2008  . AUGMENTATION MAMMAPLASTY Bilateral 2015   Bilat  . AUGMENTATION MAMMAPLASTY Bilateral 2018   implants redone w/ placement of implants under muscle  . breast lift bilateral, implants  74/[id number]/4967   bilateral, silicon naturel  . BREAST REDUCTION SURGERY Bilateral 1997  . CESAREAN SECTION  07/27/1981   Placenta Previa  . CHOLECYSTECTOMY  03/2004   Lap surgery  . COSMETIC SURGERY     Breast implants w/ lift, tummy tuck, upper & lower Blepharop  . EYE SURGERY  2017    Cataract surgery & lasik  . GASTRIC BYPASS  2005   Laparoscopic  . IVC FILTER INSERTION  12/2003   TrapEase Vena Cava Filter  . LUMBAR LAMINECTOMY  06/2006   L4-L5 (spinal fusion)  . mini tummy tuck  05/07/2016   Bilateral bra/back roll lift skin removal  . neck surgery C3-C7  02/10/2015   ACDF  . REDUCTION MAMMAPLASTY Bilateral 1997  . SHOULDER ACROMIOPLASTY Left 03/27/2013   w/ labral debridement  . SMALL INTESTINE SURGERY  12/24/2003   Lap Gastric Bypass  . SPINAL CORD STIMULATOR IMPLANT  11/2010   removed 05/2011  . SPINE SURGERY  2008 2017   Fusion L4-L5, ACDF C4-C7    Family Psychiatric History: I have reviewed family psychiatric history from my progress note on 05/15/2018  Family History:  Family History  Problem Relation Age of Onset  . COPD Mother   . Lung cancer Mother   . Diabetes Mother   . Heart disease Mother   . Stroke Mother   . Heart attack Mother   . Arthritis Mother   . Cancer Mother   . Hypertension Mother   . Obesity Mother   . Varicose Veins Mother   . Heart disease Father   . Heart attack Father 56  . Early death Father   . Depression Sister        x 5 sisters  . COPD Sister   . Hypertension Sister   . Heart disease Brother        x 3 brothers  . Diabetes Brother   . Colon polyps Sister   . Hearing loss Sister   . Heart attack Sister   . Heart attack Brother   . Arthritis Sister   . Varicose Veins Sister   . Arthritis Brother   . Heart disease Brother   . Hypertension Brother   . Cancer Maternal Grandfather   . Stroke Maternal Grandfather   . COPD Sister   . Obesity Sister   . Depression Sister   . Depression Sister   . Heart disease Sister   . Hypertension Sister   . Diabetes Brother   . Heart disease Brother   . Hypertension Brother   . Obesity Brother   . Varicose Veins Brother   . Heart disease Brother   . Hypertension Brother   . Obesity Brother   . Hypertension Sister   . Obesity Sister   . Miscarriages / Stillbirths  Maternal Aunt   . Miscarriages / Stillbirths Paternal Aunt   . Breast cancer Neg Hx     Social History: I have reviewed social history from my progress note on 05/15/2018 Social History   Socioeconomic History  . Marital status: Married    Spouse name: Not on file  . Number of children: 1  . Years of education: [employer]  . Highest education level: Some college, no degree  Occupational History  . Not on file  Tobacco Use  .  Smoking status: Former Smoker    Packs/day: 0.25    Years: 10.00    Pack years: 2.50    Types: Cigarettes    Quit date: 11/29/1995    Years since quitting: 24.3  . Smokeless tobacco: Former Systems developer  . Tobacco comment: Quite 1997, didnt smoke hardly at all when I was smoking.  Vaping Use  . Vaping Use: Never used  Substance and Sexual Activity  . Alcohol use: No  . Drug use: No  . Sexual activity: Yes    Birth control/protection: Condom, Post-menopausal, Surgical    Comment: Total Hysterectomy condoms  Other Topics Concern  . 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 Flagyl [Metronidazole] Anaphylaxis  . Cymbalta [Duloxetine Hcl]   . Tape Rash    Metabolic Disorder Labs: Lab Results  Component Value Date   HGBA1C 5.0 03/17/2019   MPG 97 03/17/2019   MPG 103 08/11/2018   No results found for: PROLACTIN Lab Results  Component Value Date   CHOL 145 09/22/2019   TRIG 72 09/22/2019   HDL 88 09/22/2019   CHOLHDL 1.6 09/22/2019   LDLCALC 42 09/22/2019   LDLCALC 53 03/17/2019   Lab Results  Component Value Date   TSH 0.86 08/11/2018   TSH 1.88 07/30/2017     Therapeutic Level Labs: No results found for: LITHIUM No results found for: VALPROATE No components found for:  CBMZ  Current Medications: Current Outpatient Medications  Medication Sig Dispense Refill  . ARIPiprazole (ABILIFY) 10 MG tablet Take 1 tablet (10 mg total) by mouth daily. 30 tablet 1  . lamoTRIgine (LAMICTAL) 25 MG tablet Take 1 tablet (25 mg total) by mouth daily. 30 tablet 0  . albuterol (VENTOLIN HFA) 108 (90 Base) MCG/ACT inhaler Inhale 2 puffs into the lungs every 4 (four) hours as needed for wheezing or shortness of breath (cough). 8 g 2  . butalbital-acetaminophen-caffeine (FIORICET) 50-325-40 MG tablet Take 1 tablet by mouth 2 (two) times daily as needed for headache.    . [facility] ASPIRIN LOW DOSE 81 MG EC tablet TAKE 1 TABLET BY MOUTH EVERY DAY 90 tablet 3  . cyanocobalamin (,VITAMIN B-12,) 1000 MCG/ML injection Inject 1,000 mcg into the muscle every 30 (thirty) days.    . diclofenac Sodium (VOLTAREN) 1 % GEL Apply 2 g topically 4 (four) times daily.    Marland Kitchen estradiol (ESTRACE) 1 MG tablet TAKE ONE TABLET BY MOUTH DAILY 90 tablet 0  . etodolac (LODINE) 500 MG tablet TAKE 1 TABLET BY MOUTH TWICE A DAY 180 tablet 1  . ezetimibe (ZETIA) 10 MG tablet TAKE ONE TABLET BY MOUTH ONE TIME DAILY 90 tablet 0  . fluticasone (FLONASE) 50 MCG/ACT nasal spray SPRAY 2 SPRAYS INTO EACH NOSTRIL EVERY DAY 48 mL 3  . furosemide (LASIX) 20 MG tablet Take 1 tablet (20 mg total) by mouth daily as needed for fluid or edema. 90 tablet 3  . gabapentin (NEURONTIN) 600 MG tablet Take 1 tablet (600 mg total) by mouth 3 (three) times daily. 90 tablet 3  . hydrOXYzine (ATARAX/VISTARIL) 25 MG tablet TAKE 0.5-1 TABLETS (  12.5-25 MG TOTAL) BY MOUTH 2 (TWO) TIMES DAILY AS NEEDED FOR ANXIETY. FOR SEVERE PANIC ATTACKS ONLY 180 tablet 1  . omeprazole (PRILOSEC) 40 MG capsule TAKE 1 CAPSULE BY MOUTH EVERY DAY 90 capsule 1  . ondansetron (ZOFRAN ODT) 4 MG disintegrating tablet Take 1 tablet (4 mg total) by mouth  every 8 (eight) hours as needed for nausea or vomiting. 30 tablet 2  . OZEMPIC, 1 MG/DOSE, 4 MG/3ML SOPN INJECT 1 MG INTO THE SKIN ONCE A WEEK. 3 mL 1  . phentermine (ADIPEX-P) 37.5 MG tablet Take 37.5 mg by mouth at bedtime.    . propranolol (INDERAL) 10 MG tablet TAKE ONE TABLET BY MOUTH THREE TIMES DAILY AS NEEDED for severe anxiety attacks 30 tablet 0  . rosuvastatin (CRESTOR) 40 MG tablet Take 1 tablet (40 mg total) by mouth daily. 90 tablet 3  . tiZANidine (ZANAFLEX) 4 MG tablet Take 4 mg by mouth 4 (four) times daily.    . traMADol (ULTRAM) 50 MG tablet Take 50 mg by mouth 3 (three) times daily as needed.    . venlafaxine XR (EFFEXOR-XR) 75 MG 24 hr capsule TAKE ONE CAPSULE BY MOUTH DAILY with breakfast 90 capsule 0  . XIIDRA 5 % SOLN INSTILL 1 DROP INTO BOTH EYES TWICE A DAY     No current facility-administered medications for this visit.     Musculoskeletal: Strength & Muscle Tone: UTA Gait & Station: UTA Patient leans: N/A  Psychiatric Specialty Exam: Review of Systems  Constitutional: Positive for fatigue.  Psychiatric/Behavioral: The patient is nervous/anxious.        Irritable  All other systems reviewed and are negative.   There were no vitals taken for this visit.There is no height or weight on file to calculate BMI.  General Appearance: Casual  Eye Contact:  Fair  Speech:  Clear and Coherent  Volume:  Normal  Mood:  Anxious and Irritable  Affect:  Congruent  Thought Process:  Goal Directed and Descriptions of Associations: Intact  Orientation:  Full (Time, Place, and Person)  Thought Content: Logical   Suicidal Thoughts:  No  Homicidal Thoughts:  No  Memory:  Immediate;   Fair Recent;   Fair Remote;   Fair  Judgement:  Fair  Insight:  Fair  Psychomotor Activity:  Normal  Concentration:  Concentration: Fair and Attention Span: Fair  Recall:  [employer] of Knowledge: Fair  Language: Fair  Akathisia:  No  Handed:  Right  AIMS (if indicated): UTA   Assets:  Communication Skills Desire for Improvement Housing Intimacy Social Support  ADL's:  Intact  Cognition: WNL  Sleep:  improving   Screenings: GAD-7   Flowsheet Row [facility] Visit from 12/28/2019 in [facility] Office Visit from 03/17/2019 in [facility] Office Visit from 11/20/2018 in [facility] Office Visit from 08/18/2018 in [facility] Office Visit from 07/22/2018 in [facility]  Total GAD-7 Score 2 2 4 9 8     [id number]   [place] Office Visit from 12/28/2019 in [facility] Office Visit from 09/21/2019 in [facility] Office Visit from 06/18/2019 in [facility] Office Visit from 03/17/2019 in [facility] Office Visit from 12/16/2018 in [facility]  PHQ-2 Total Score 0 0 0 0 0  PHQ-9 Total Score 1 0 -- 2 --       Assessment and Plan: Ashley Fields is a 65 year old  Caucasian female who has a history of PTSD, bipolar disorder, panic attacks, migraine headaches, chronic pain was evaluated by telemedicine today.  Patient is currently struggling with irritability and will benefit from the following plan.  Plan Bipolar disorder mixed mild-unstable Reduce Abilify to 10 mg p.o. daily Continue venlafaxine extended release 112.5 mg p.o. daily Start Lamictal 25 mg p.o. daily Provided medication education Continue gabapentin 600 mg p.o. 3 times daily-prescribed by her primary care provider for her pain.  PTSD-improving Venlafaxine 112.5 mg p.o. daily Patient was referred for CBT-provided information for [facility]. Hydroxyzine 25 mg daily as needed for severe anxiety attacks  Insomnia-improving Trazodone 100 mg p.o. nightly Melatonin 10 mg p.o. nightly Discontinue Ambien for side effects  Follow-up in clinic in 4 weeks or sooner if needed.  This note was generated in part or whole with voice  recognition software. Voice recognition is usually quite accurate but there are transcription errors that can and very often do occur. I apologize for any typographical errors that were not detected and corrected.        Ursula Alert, MD 03/29/2020, 8:31 AM

## 2020-03-29 ENCOUNTER — Ambulatory Visit (INDEPENDENT_AMBULATORY_CARE_PROVIDER_SITE_OTHER): Payer: 59

## 2020-03-29 ENCOUNTER — Other Ambulatory Visit: Payer: Self-pay | Admitting: *Deleted

## 2020-03-29 VITALS — Ht 62.0 in | Wt 184.0 lb

## 2020-03-29 DIAGNOSIS — R7309 Other abnormal glucose: Secondary | ICD-10-CM

## 2020-03-29 DIAGNOSIS — E538 Deficiency of other specified B group vitamins: Secondary | ICD-10-CM

## 2020-03-29 DIAGNOSIS — E559 Vitamin D deficiency, unspecified: Secondary | ICD-10-CM

## 2020-03-29 DIAGNOSIS — E782 Mixed hyperlipidemia: Secondary | ICD-10-CM

## 2020-03-29 DIAGNOSIS — Z0000 Encounter for general adult medical examination without abnormal findings: Secondary | ICD-10-CM

## 2020-03-29 DIAGNOSIS — F4310 Post-traumatic stress disorder, unspecified: Secondary | ICD-10-CM

## 2020-03-29 DIAGNOSIS — Z9884 Bariatric surgery status: Secondary | ICD-10-CM

## 2020-03-29 NOTE — Patient Instructions (Signed)
Ashley Fields , Thank you for taking time to come for your Medicare Wellness Visit. I appreciate your ongoing commitment to your health goals. Please review the following plan we discussed and let me know if I can assist you in the future.   Screening recommendations/referrals: Colonoscopy: cologuard 08/05/2017, due 08/05/2020 Mammogram: completed 08/24/2019, due 08/23/2021 Bone Density: completed 08/24/2019 Recommended yearly ophthalmology/optometry visit for glaucoma screening and checkup Recommended yearly dental visit for hygiene and checkup  Vaccinations: Influenza vaccine: completed 09/12/2019, due 08/08/2020 Pneumococcal vaccine: n/a Tdap vaccine: completed 07/12/2017, due 07/13/2027 Shingles vaccine: discussed  Covid-19:  02/13/2020, 09/01/2019, 08/11/2019  Advanced directives: Advance directive discussed with you today.   Conditions/risks identified: none  Next appointment: Follow up in one year for your annual wellness visit.   Preventive Care 40-64 Years, Female Preventive care refers to lifestyle choices and visits with your health care provider that can promote health and wellness. What does preventive care include?  A yearly physical exam. This is also called an annual well check.  Dental exams once or twice a year.  Routine eye exams. Ask your health care provider how often you should have your eyes checked.  Personal lifestyle choices, including:  Daily care of your teeth and gums.  Regular physical activity.  Eating a healthy diet.  Avoiding tobacco and drug use.  Limiting alcohol use.  Practicing safe sex.  Taking low-dose aspirin daily starting at age 50.  Taking vitamin and mineral supplements as recommended by your health care provider. What happens during an annual well check? The services and screenings done by your health care provider during your annual well check will depend on your age, overall health, lifestyle risk factors, and family history of  disease. Counseling  Your health care provider may ask you questions about your:  Alcohol use.  Tobacco use.  Drug use.  Emotional well-being.  Home and relationship well-being.  Sexual activity.  Eating habits.  Work and work Statistician.  Method of birth control.  Menstrual cycle.  Pregnancy history. Screening  You may have the following tests or measurements:  Height, weight, and BMI.  Blood pressure.  Lipid and cholesterol levels. These may be checked every 5 years, or more frequently if you are over 8 years old.  Skin check.  Lung cancer screening. You may have this screening every year starting at age 49 if you have a 30-pack-year history of smoking and currently smoke or have quit within the past 15 years.  Fecal occult blood test (FOBT) of the stool. You may have this test every year starting at age 64.  Flexible sigmoidoscopy or colonoscopy. You may have a sigmoidoscopy every 5 years or a colonoscopy every 10 years starting at age 89.  Hepatitis C blood test.  Hepatitis B blood test.  Sexually transmitted disease (STD) testing.  Diabetes screening. This is done by checking your blood sugar (glucose) after you have not eaten for a while (fasting). You may have this done every 1-3 years.  Mammogram. This may be done every 1-2 years. Talk to your health care provider about when you should start having regular mammograms. This may depend on whether you have a family history of breast cancer.  BRCA-related cancer screening. This may be done if you have a family history of breast, ovarian, tubal, or peritoneal cancers.  Pelvic exam and Pap test. This may be done every 3 years starting at age 53. Starting at age 28, this may be done every 5 years if you have  a Pap test in combination with an HPV test.  Bone density scan. This is done to screen for osteoporosis. You may have this scan if you are at high risk for osteoporosis. Discuss your test results,  treatment options, and if necessary, the need for more tests with your health care provider. Vaccines  Your health care provider may recommend certain vaccines, such as:  Influenza vaccine. This is recommended every year.  Tetanus, diphtheria, and acellular pertussis (Tdap, Td) vaccine. You may need a Td booster every 10 years.  Zoster vaccine. You may need this after age 76.  Pneumococcal 13-valent conjugate (PCV13) vaccine. You may need this if you have certain conditions and were not previously vaccinated.  Pneumococcal polysaccharide (PPSV23) vaccine. You may need one or two doses if you smoke cigarettes or if you have certain conditions. Talk to your health care provider about which screenings and vaccines you need and how often you need them. This information is not intended to replace advice given to you by your health care provider. Make sure you discuss any questions you have with your health care provider. Document Released: 01/21/2015 Document Revised: 09/14/2015 Document Reviewed: 10/26/2014 Elsevier Interactive Patient Education  2017 [employer] Prevention in the Home Falls can cause injuries. They can happen to people of all ages. There are many things you can do to make your home safe and to help prevent falls. What can I do on the outside of my home?  Regularly fix the edges of walkways and driveways and fix any cracks.  Remove anything that might make you trip as you walk through a door, such as a raised step or threshold.  Trim any bushes or trees on the path to your home.  Use bright outdoor lighting.  Clear any walking paths of anything that might make someone trip, such as rocks or tools.  Regularly check to see if handrails are loose or broken. Make sure that both sides of any steps have handrails.  Any raised decks and porches should have guardrails on the edges.  Have any leaves, snow, or ice cleared regularly.  Use sand or salt on walking  paths during winter.  Clean up any spills in your garage right away. This includes oil or grease spills. What can I do in the bathroom?  Use night lights.  Install grab bars by the toilet and in the tub and shower. Do not use towel bars as grab bars.  Use non-skid mats or decals in the tub or shower.  If you need to sit down in the shower, use a plastic, non-slip stool.  Keep the floor dry. Clean up any water that spills on the floor as soon as it happens.  Remove soap buildup in the tub or shower regularly.  Attach bath mats securely with double-sided non-slip rug tape.  Do not have throw rugs and other things on the floor that can make you trip. What can I do in the bedroom?  Use night lights.  Make sure that you have a light by your bed that is easy to reach.  Do not use any sheets or blankets that are too big for your bed. They should not hang down onto the floor.  Have a firm chair that has side arms. You can use this for support while you get dressed.  Do not have throw rugs and other things on the floor that can make you trip. What can I do in the kitchen?  Clean up any spills right away.  Avoid walking on wet floors.  Keep items that you use a lot in easy-to-reach places.  If you need to reach something above you, use a strong step stool that has a grab bar.  Keep electrical cords out of the way.  Do not use floor polish or wax that makes floors slippery. If you must use wax, use non-skid floor wax.  Do not have throw rugs and other things on the floor that can make you trip. What can I do with my stairs?  Do not leave any items on the stairs.  Make sure that there are handrails on both sides of the stairs and use them. Fix handrails that are broken or loose. Make sure that handrails are as long as the stairways.  Check any carpeting to make sure that it is firmly attached to the stairs. Fix any carpet that is loose or worn.  Avoid having throw rugs at  the top or bottom of the stairs. If you do have throw rugs, attach them to the floor with carpet tape.  Make sure that you have a light switch at the top of the stairs and the bottom of the stairs. If you do not have them, ask someone to add them for you. What else can I do to help prevent falls?  Wear shoes that:  Do not have high heels.  Have rubber bottoms.  Are comfortable and fit you well.  Are closed at the toe. Do not wear sandals.  If you use a stepladder:  Make sure that it is fully opened. Do not climb a closed stepladder.  Make sure that both sides of the stepladder are locked into place.  Ask someone to hold it for you, if possible.  Clearly mark and make sure that you can see:  Any grab bars or handrails.  First and last steps.  Where the edge of each step is.  Use tools that help you move around (mobility aids) if they are needed. These include:  Canes.  Walkers.  Scooters.  Crutches.  Turn on the lights when you go into a dark area. Replace any light bulbs as soon as they burn out.  Set up your furniture so you have a clear path. Avoid moving your furniture around.  If any of your floors are uneven, fix them.  If there are any pets around you, be aware of where they are.  Review your medicines with your doctor. Some medicines can make you feel dizzy. This can increase your chance of falling. Ask your doctor what other things that you can do to help prevent falls. This information is not intended to replace advice given to you by your health care provider. Make sure you discuss any questions you have with your health care provider. Document Released: 10/21/2008 Document Revised: 06/02/2015 Document Reviewed: 01/29/2014 Elsevier Interactive Patient Education  2017 [employer].

## 2020-03-29 NOTE — Progress Notes (Signed)
I connected with Ashley Fields today by telephone and verified that I am speaking with the correct person using two identifiers. Location patient: home Location provider: work Persons participating in the virtual visit: Ashley Fields, [place] LPN.   I discussed the limitations, risks, security and privacy concerns of performing an evaluation and management service by telephone and the availability of in person appointments. I also discussed with the patient that there may be a patient responsible charge related to this service. The patient expressed understanding and verbally consented to this telephonic visit.    Interactive audio and video telecommunications were attempted between this provider and patient, however failed, due to patient having technical difficulties OR patient did not have access to video capability.  We continued and completed visit with audio only.     Vital signs may be patient reported or missing.  Subjective:   Ashley Fields is a 65 y.o. female who presents for Medicare Annual (Subsequent) preventive examination.  Review of Systems     Cardiac Risk Factors include: none     Objective:    Today's Vitals   03/29/20 1057 03/29/20 1058  Weight: 184 lb (83.5 kg)   Height: 5\' 2"  (1.575 m)   PainSc:  5    Body mass index is 33.65 kg/m.  Advanced Directives 03/29/2020 03/07/2020 08/22/2018 07/29/2018 09/12/2017 08/27/2017 07/23/2017  Does Patient Have a Medical Advance Directive? No No Yes Yes Yes Yes Yes  Type of Advance Directive - - Press photographer Living will;Healthcare Power of [place];Living will -  Does patient want to make changes to medical advance directive? - - No - Patient declined - - - -  Copy of [place] in Chart? - - - No - copy requested Yes - -  Would patient like information on creating a medical advance directive? - No - Patient declined No - Patient declined  - - - -  Some encounter information is confidential and restricted. Go to Review Flowsheets activity to see all data.    Current Medications (verified) Outpatient Encounter Medications as of 03/29/2020  Medication Sig  . albuterol (VENTOLIN HFA) 108 (90 Base) MCG/ACT inhaler Inhale 2 puffs into the lungs every 4 (four) hours as needed for wheezing or shortness of breath (cough).  . ARIPiprazole (ABILIFY) 10 MG tablet Take 1 tablet (10 mg total) by mouth daily.  . butalbital-acetaminophen-caffeine (FIORICET) 50-325-40 MG tablet Take 1 tablet by mouth 2 (two) times daily as needed for headache.  . [facility] ASPIRIN LOW DOSE 81 MG EC tablet TAKE 1 TABLET BY MOUTH EVERY DAY  . cyanocobalamin (,VITAMIN B-12,) 1000 MCG/ML injection Inject 1,000 mcg into the muscle every 30 (thirty) days.  . diclofenac Sodium (VOLTAREN) 1 % GEL Apply 2 g topically 4 (four) times daily.  Marland Kitchen estradiol (ESTRACE) 1 MG tablet TAKE ONE TABLET BY MOUTH DAILY  . etodolac (LODINE) 500 MG tablet TAKE 1 TABLET BY MOUTH TWICE A DAY  . ezetimibe (ZETIA) 10 MG tablet TAKE ONE TABLET BY MOUTH ONE TIME DAILY  . fluticasone (FLONASE) 50 MCG/ACT nasal spray SPRAY 2 SPRAYS INTO EACH NOSTRIL EVERY DAY  . furosemide (LASIX) 20 MG tablet Take 1 tablet (20 mg total) by mouth daily as needed for fluid or edema.  . gabapentin (NEURONTIN) 600 MG tablet Take 1 tablet (600 mg total) by mouth 3 (three) times daily.  . hydrOXYzine (ATARAX/VISTARIL) 25 MG tablet TAKE 0.5-1 TABLETS (12.5-25 MG TOTAL) BY  MOUTH 2 (TWO) TIMES DAILY AS NEEDED FOR ANXIETY. FOR SEVERE PANIC ATTACKS ONLY  . lamoTRIgine (LAMICTAL) 25 MG tablet Take 1 tablet (25 mg total) by mouth daily.  Marland Kitchen omeprazole (PRILOSEC) 40 MG capsule TAKE 1 CAPSULE BY MOUTH EVERY DAY  . ondansetron (ZOFRAN ODT) 4 MG disintegrating tablet Take 1 tablet (4 mg total) by mouth every 8 (eight) hours as needed for nausea or vomiting.  Marland Kitchen OZEMPIC, 1 MG/DOSE, 4 MG/3ML SOPN INJECT 1 MG INTO THE SKIN ONCE A WEEK.  .  phentermine (ADIPEX-P) 37.5 MG tablet Take 37.5 mg by mouth at bedtime.  . propranolol (INDERAL) 10 MG tablet TAKE ONE TABLET BY MOUTH THREE TIMES DAILY AS NEEDED for severe anxiety attacks  . rosuvastatin (CRESTOR) 40 MG tablet Take 1 tablet (40 mg total) by mouth daily.  Marland Kitchen tiZANidine (ZANAFLEX) 4 MG tablet Take 4 mg by mouth 4 (four) times daily.  . traMADol (ULTRAM) 50 MG tablet Take 50 mg by mouth 3 (three) times daily as needed.  . venlafaxine XR (EFFEXOR-XR) 75 MG 24 hr capsule TAKE ONE CAPSULE BY MOUTH DAILY with breakfast  . XIIDRA 5 % SOLN INSTILL 1 DROP INTO BOTH EYES TWICE A DAY   No facility-administered encounter medications on file as of 03/29/2020.    Allergies (verified) Flagyl [metronidazole], Cymbalta [duloxetine hcl], and Tape   History: Past Medical History:  Diagnosis Date  . Allergy   . Anemia   . Anxiety   . Arthritis    Yes  . Blood transfusion without reported diagnosis 1977   Transfusions from miscarriage & hemorrhaging  . Chronic kidney disease   . Depression   . Disp fx of cuboid bone of right foot, init for clos fx 01/01/2017  . GERD (gastroesophageal reflux disease)   . Headache   . Hepatitis C   . Hyperlipidemia   . Neuromuscular disorder ([place]) 1997   Falling to much was an eye-opener  . Osteoporosis   . Thyroid disease   . Urine incontinence    Past Surgical History:  Procedure Laterality Date  . 5 miscarriages     1977-1985, Blood transfusion s/p miscarriage 1977  . ABDOMINAL HYSTERECTOMY  1987   Total  . APPENDECTOMY  12/2008  . AUGMENTATION MAMMAPLASTY Bilateral 2015   Bilat  . AUGMENTATION MAMMAPLASTY Bilateral 2018   implants redone w/ placement of implants under muscle  . breast lift bilateral, implants  17/79/3903   bilateral, silicon naturel  . BREAST REDUCTION SURGERY Bilateral 1997  . CESAREAN SECTION  07/27/1981   Placenta Previa  . CHOLECYSTECTOMY  03/2004   Lap surgery  . COSMETIC SURGERY     Breast implants w/ lift,  tummy tuck, upper & lower Blepharop  . EYE SURGERY  2017   Cataract surgery & lasik  . GASTRIC BYPASS  2005   Laparoscopic  . IVC FILTER INSERTION  12/2003   TrapEase Vena Cava Filter  . LUMBAR LAMINECTOMY  06/2006   L4-L5 (spinal fusion)  . mini tummy tuck  05/07/2016   Bilateral bra/back roll lift skin removal  . neck surgery C3-C7  02/10/2015   ACDF  . REDUCTION MAMMAPLASTY Bilateral 1997  . SHOULDER ACROMIOPLASTY Left 03/27/2013   w/ labral debridement  . SMALL INTESTINE SURGERY  12/24/2003   Lap Gastric Bypass  . SPINAL CORD STIMULATOR IMPLANT  11/2010   removed 05/2011  . SPINE SURGERY  2008 2017   Fusion L4-L5, ACDF C4-C7   Family History  Problem Relation Age of Onset  .  COPD Mother   . Lung cancer Mother   . Diabetes Mother   . Heart disease Mother   . Stroke Mother   . Heart attack Mother   . Arthritis Mother   . Cancer Mother   . Hypertension Mother   . Obesity Mother   . Varicose Veins Mother   . Heart disease Father   . Heart attack Father 45  . Early death Father   . Depression Sister        x 5 sisters  . COPD Sister   . Hypertension Sister   . Heart disease Brother        x 3 brothers  . Diabetes Brother   . Colon polyps Sister   . Hearing loss Sister   . Heart attack Sister   . Heart attack Brother   . Arthritis Sister   . Varicose Veins Sister   . Arthritis Brother   . Heart disease Brother   . Hypertension Brother   . Cancer Maternal Grandfather   . Stroke Maternal Grandfather   . COPD Sister   . Obesity Sister   . Depression Sister   . Depression Sister   . Heart disease Sister   . Hypertension Sister   . Diabetes Brother   . Heart disease Brother   . Hypertension Brother   . Obesity Brother   . Varicose Veins Brother   . Heart disease Brother   . Hypertension Brother   . Obesity Brother   . Hypertension Sister   . Obesity Sister   . Miscarriages / Stillbirths Maternal Aunt   . Miscarriages / Stillbirths Paternal Aunt   .  Breast cancer Neg Hx    Social History   Socioeconomic History  . Marital status: Married    Spouse name: Not on file  . Number of children: 1  . Years of education: [employer]  . Highest education level: Some college, no degree  Occupational History  . Occupation: disability  Tobacco Use  . Smoking status: Former Smoker    Packs/day: 0.25    Years: 10.00    Pack years: 2.50    Types: Cigarettes    Quit date: 11/29/1995    Years since quitting: 24.3  . Smokeless tobacco: Former Systems developer  . Tobacco comment: Quite 1997, didnt smoke hardly at all when I was smoking.  Vaping Use  . Vaping Use: Never used  Substance and Sexual Activity  . Alcohol use: No  . Drug use: No  . Sexual activity: Yes    Birth control/protection: Condom, Post-menopausal, Surgical    Comment: Total Hysterectomy condoms  Other Topics Concern  . 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 Difficulty of Paying Living Expenses: Not hard at all  Food Insecurity: No Food Insecurity  . Worried About Charity fundraiser in the Last Year: Never true  . Ran Out of Food in the Last Year: Never true  Transportation Needs: No Transportation Needs  . Lack of Transportation (Medical): No  . Lack of Transportation (Non-Medical): No  Physical Activity: Inactive  . Days of Exercise per Week: 0 days  . Minutes of Exercise per Session: 0 min  Stress: Stress Concern Present  . Feeling of Stress : Very much  Social Connections: Not on file    Tobacco  Counseling Counseling given: Not Answered Comment: Quite 1997, didnt smoke hardly at all when I was smoking.   Clinical Intake:  Pre-visit preparation completed: Yes  Pain : 0-10 Pain Score: 5  Pain Type: Chronic pain Pain Location: Back Pain Orientation: Lower Pain Descriptors /  Indicators: Aching,Dull Pain Onset: More than a month ago Pain Frequency: Constant     Nutritional Status: BMI > 30  Obese Nutritional Risks: None Diabetes: No  How often do you need to have someone help you when you read instructions, pamphlets, or other written materials from your doctor or pharmacy?: 1 - Never What is the last grade level you completed in school?: some college  Diabetic? no  Interpreter Needed?: No  Information entered by :: NAllen LPN   Activities of Daily Living In your present state of health, do you have any difficulty performing the following activities: 03/29/2020 09/21/2019  Hearing? N N  Vision? N N  Difficulty concentrating or making decisions? N N  Walking or climbing stairs? Y N  Dressing or bathing? N N  Doing errands, shopping? N N  Preparing Food and eating ? N -  Using the Toilet? N -  In the past six months, have you accidently leaked urine? Y -  Do you have problems with loss of bowel control? N -  Managing your Medications? N -  Managing your Finances? N -  Housekeeping or managing your Housekeeping? N -  Some recent data might be hidden    Patient Care Team: Olin Hauser, DO as PCP - General (Family Medicine)  Indicate any recent Medical Services you may have received from other than [facility] providers in the past year (date may be approximate).     Assessment:   This is a routine wellness examination for Ashley Fields.  Hearing/Vision screen No exam data present  Dietary issues and exercise activities discussed: Current Exercise Habits: The patient has a physically strenuous job, but has no regular exercise apart from work[place]  Goals    . Patient Stated     03/29/2020, get off some medications      Depression Screen PHQ 2/9 Scores 03/29/2020 12/28/2019 09/21/2019 06/18/2019 03/17/2019 12/16/2018 11/20/2018  PHQ - 2 Score 4 0 0 0 0 0 2  PHQ- 9 Score 9 1 0 - 2 - 6  Exception Documentation - - - - - - -     Fall Risk Fall Risk  03/29/2020 01/21/2020 12/28/2019 12/28/2019 09/21/2019  Falls in the past year? 1 0 0 0 0  Comment dog drug down stairs, tripped over ladder, tripped up stairs - - - -  Number falls in past yr: 1 0 0 0 0  Comment - - - - -  Injury with Fall? 0 0 0 0 0  Risk Factor Category  - - - - -  Risk for fall due to : Impaired balance/gait;Medication side effect - - - -  Risk for fall due to: Comment - - - - -  Follow up Falls evaluation completed;Education provided;Falls prevention discussed - Falls evaluation completed Falls evaluation completed Falls evaluation completed    FALL RISK PREVENTION PERTAINING TO THE HOME:  Any stairs in or around the home? Yes  If so, are there any without handrails? No  Home free of loose throw rugs in walkways, pet beds, electrical cords, etc? Yes  Adequate lighting in your home to reduce risk of falls? Yes   ASSISTIVE DEVICES UTILIZED TO PREVENT FALLS:  Life  alert? No  Use of a cane, walker or w/c? Yes  Grab bars in the bathroom? Yes  Shower chair or bench in shower? Yes  Elevated toilet seat or a handicapped toilet? No   TIMED UP AND GO:  Was the test performed? No .     Cognitive Function:     6CIT Screen 03/29/2020  What Year? 0 points  What month? 0 points  What time? 0 points  Count back from 20 0 points  Months in reverse 0 points  Repeat phrase 0 points  Total Score 0    Immunizations Immunization History  Administered Date(s) Administered  . Influenza,inj,Quad PF,6+ Mos 09/12/2017, 08/21/2018, 09/12/2019  . Influenza,inj,quad, With Preservative 10/21/2018  . Influenza-Unspecified 09/12/2017  . PFIZER Comirnaty(Gray Top)Covid-19 Tri-Sucrose Vaccine 02/13/2020  . PFIZER(Purple Top)SARS-COV-2 Vaccination 08/11/2019, 09/01/2019  . Tdap 07/12/2017    TDAP status: Up to date  Flu Vaccine status: Up to date  Pneumococcal vaccine status: Up to date  Covid-19 vaccine status: Completed vaccines  Qualifies for  Shingles Vaccine? Yes   Zostavax completed Yes   Shingrix Completed?: No.    Education has been provided regarding the importance of this vaccine. Patient has been advised to call insurance company to determine out of pocket expense if they have not yet received this vaccine. Advised may also receive vaccine at local pharmacy or Health Dept. Verbalized acceptance and understanding.  Screening Tests Health Maintenance  Topic Date Due  . Fecal DNA (Cologuard)  08/05/2020  . COVID-19 Vaccine (4 - Booster for Pfizer series) 08/12/2020  . MAMMOGRAM  08/23/2021  . TETANUS/TDAP  07/13/2027  . INFLUENZA VACCINE  Completed  . Hepatitis C Screening  Completed  . HIV Screening  Completed  . HPV VACCINES  Aged Out    Health Maintenance  There are no preventive care reminders to display for this patient.  Colorectal cancer screening: Type of screening: Cologuard. Completed 08/05/2017. Repeat every 3 years  Mammogram status: Completed 08/24/2019. Repeat every 2 years  Bone Density status: Completed 08/24/2019.   Lung Cancer Screening: (Low Dose CT Chest recommended if Age 48-80 years, 30 pack-year currently smoking OR have quit w/in 15years.) does not qualify.   Lung Cancer Screening Referral: no  Additional Screening:  Hepatitis C Screening: does qualify; Completed 07/24/2019  Vision Screening: Recommended annual ophthalmology exams for early detection of glaucoma and other disorders of the eye. Is the patient up to date with their annual eye exam?  Yes  Who is the provider or what is the name of the office in which the patient attends annual eye exams? Dr. Ellin Mayhew If pt is not established with a provider, would they like to be referred to a provider to establish care? No .   Dental Screening: Recommended annual dental exams for proper oral hygiene  Community Resource Referral / Chronic Care Management: CRR required this visit?  No   CCM required this visit?  No      Plan:     I  have personally reviewed and noted the following in the patient's chart:   . Medical and social history . Use of alcohol, tobacco or illicit drugs  . Current medications and supplements . Functional ability and status . Nutritional status . Physical activity . Advanced directives . List of other physicians . Hospitalizations, surgeries, and ER visits in previous 12 months . Vitals . Screenings to include cognitive, depression, and falls . Referrals and appointments  In addition, I have reviewed and discussed with patient  certain preventive protocols, quality metrics, and best practice recommendations. A written personalized care plan for preventive services as well as general preventive health recommendations were provided to patient.     Kellie Simmering, LPN   [id number]   Nurse Notes:

## 2020-03-30 ENCOUNTER — Other Ambulatory Visit: Payer: 59

## 2020-03-30 ENCOUNTER — Other Ambulatory Visit: Payer: Self-pay

## 2020-03-31 ENCOUNTER — Other Ambulatory Visit: Payer: Self-pay | Admitting: Psychiatry

## 2020-03-31 DIAGNOSIS — F410 Panic disorder [episodic paroxysmal anxiety] without agoraphobia: Secondary | ICD-10-CM

## 2020-03-31 DIAGNOSIS — F4310 Post-traumatic stress disorder, unspecified: Secondary | ICD-10-CM

## 2020-03-31 LAB — THYROID PANEL WITH TSH
Free Thyroxine Index: 2.2 (ref 1.4–3.8)
T3 Uptake: 27 % (ref 22–35)
T4, Total: 8.1 ug/dL (ref 5.1–11.9)
TSH: 1.97 mIU/L (ref 0.40–4.50)

## 2020-03-31 LAB — COMPLETE METABOLIC PANEL WITH GFR
AG Ratio: 1.7 (calc) (ref 1.0–2.5)
ALT: 23 U/L (ref 6–29)
AST: 26 U/L (ref 10–35)
Albumin: 4.3 g/dL (ref 3.6–5.1)
Alkaline phosphatase (APISO): 48 U/L (ref 37–153)
BUN: 15 mg/dL (ref 7–25)
CO2: 32 mmol/L (ref 20–32)
Calcium: 9.4 mg/dL (ref 8.6–10.4)
Chloride: 103 mmol/L (ref 98–110)
Creat: 0.96 mg/dL (ref 0.50–0.99)
GFR, Est African American: 73 mL/min/{1.73_m2} (ref >=60)
GFR, Est Non African American: 63 mL/min/{1.73_m2} (ref >=60)
Globulin: 2.5 g/dL (calc) (ref 1.9–3.7)
Glucose, Bld: 85 mg/dL (ref 65–99)
Potassium: 4.6 mmol/L (ref 3.5–5.3)
Sodium: 142 mmol/L (ref 135–146)
Total Bilirubin: 0.8 mg/dL (ref 0.2–1.2)
Total Protein: 6.8 g/dL (ref 6.1–8.1)

## 2020-03-31 LAB — CBC WITH DIFFERENTIAL/PLATELET
Absolute Monocytes: 349 cells/uL (ref 200–950)
Basophils Absolute: 41 cells/uL (ref 0–200)
Basophils Relative: 1 %
Eosinophils Absolute: 160 cells/uL (ref 15–500)
Eosinophils Relative: 3.9 %
HCT: 43.1 % (ref 35.0–45.0)
Hemoglobin: 14.4 g/dL (ref 11.7–15.5)
Lymphs Abs: 1292 cells/uL (ref 850–3900)
MCH: 30.5 pg (ref 27.0–33.0)
MCHC: 33.4 g/dL (ref 32.0–36.0)
MCV: 91.3 fL (ref 80.0–100.0)
MPV: 9.9 fL (ref 7.5–12.5)
Monocytes Relative: 8.5 %
Neutro Abs: 2259 cells/uL (ref 1500–7800)
Neutrophils Relative %: 55.1 %
Platelets: 167 10*3/uL (ref 140–400)
RBC: 4.72 10*6/uL (ref 3.80–5.10)
RDW: 12.4 % (ref 11.0–15.0)
Total Lymphocyte: 31.5 %
WBC: 4.1 10*3/uL (ref 3.8–10.8)

## 2020-03-31 LAB — LIPID PANEL
Cholesterol: 152 mg/dL (ref <200)
HDL: 89 mg/dL (ref >=50)
LDL Cholesterol (Calc): 49 mg/dL (calc)
Non-HDL Cholesterol (Calc): 63 mg/dL (calc) (ref <130)
Total CHOL/HDL Ratio: 1.7 (calc) (ref <5.0)
Triglycerides: 67 mg/dL (ref <150)

## 2020-03-31 LAB — HEMOGLOBIN A1C
Hgb A1c MFr Bld: 5 % of total Hgb (ref <5.7)
Mean Plasma Glucose: 97 mg/dL
eAG (mmol/L): 5.4 mmol/L

## 2020-03-31 LAB — VITAMIN B12: Vitamin B-12: 482 pg/mL (ref 200–1100)

## 2020-03-31 LAB — VITAMIN D 25 HYDROXY (VIT D DEFICIENCY, FRACTURES): Vit D, 25-Hydroxy: 55 ng/mL (ref 30–100)

## 2020-04-05 ENCOUNTER — Other Ambulatory Visit: Payer: Self-pay | Admitting: Psychiatry

## 2020-04-05 DIAGNOSIS — F410 Panic disorder [episodic paroxysmal anxiety] without agoraphobia: Secondary | ICD-10-CM

## 2020-04-05 DIAGNOSIS — F4310 Post-traumatic stress disorder, unspecified: Secondary | ICD-10-CM

## 2020-04-05 DIAGNOSIS — F3161 Bipolar disorder, current episode mixed, mild: Secondary | ICD-10-CM

## 2020-04-06 ENCOUNTER — Encounter: Payer: 59 | Admitting: Family Medicine

## 2020-04-10 ENCOUNTER — Other Ambulatory Visit: Payer: Self-pay | Admitting: Psychiatry

## 2020-04-10 DIAGNOSIS — F3161 Bipolar disorder, current episode mixed, mild: Secondary | ICD-10-CM

## 2020-04-10 DIAGNOSIS — F3176 Bipolar disorder, in full remission, most recent episode depressed: Secondary | ICD-10-CM

## 2020-04-10 DIAGNOSIS — F410 Panic disorder [episodic paroxysmal anxiety] without agoraphobia: Secondary | ICD-10-CM

## 2020-04-10 DIAGNOSIS — F4310 Post-traumatic stress disorder, unspecified: Secondary | ICD-10-CM

## 2020-04-11 ENCOUNTER — Encounter: Payer: 59 | Admitting: Family Medicine

## 2020-04-15 ENCOUNTER — Other Ambulatory Visit: Payer: Self-pay | Admitting: Psychiatry

## 2020-04-15 DIAGNOSIS — F3161 Bipolar disorder, current episode mixed, mild: Secondary | ICD-10-CM

## 2020-04-15 DIAGNOSIS — F4310 Post-traumatic stress disorder, unspecified: Secondary | ICD-10-CM

## 2020-04-15 DIAGNOSIS — F410 Panic disorder [episodic paroxysmal anxiety] without agoraphobia: Secondary | ICD-10-CM

## 2020-04-19 ENCOUNTER — Other Ambulatory Visit: Payer: Self-pay | Admitting: Psychiatry

## 2020-04-19 DIAGNOSIS — F410 Panic disorder [episodic paroxysmal anxiety] without agoraphobia: Secondary | ICD-10-CM

## 2020-04-19 DIAGNOSIS — F4310 Post-traumatic stress disorder, unspecified: Secondary | ICD-10-CM

## 2020-04-19 DIAGNOSIS — F3161 Bipolar disorder, current episode mixed, mild: Secondary | ICD-10-CM

## 2020-04-25 ENCOUNTER — Other Ambulatory Visit: Payer: Self-pay | Admitting: Family Medicine

## 2020-04-25 ENCOUNTER — Other Ambulatory Visit: Payer: Self-pay

## 2020-04-25 ENCOUNTER — Telehealth (INDEPENDENT_AMBULATORY_CARE_PROVIDER_SITE_OTHER): Payer: 59 | Admitting: Psychiatry

## 2020-04-25 ENCOUNTER — Encounter: Payer: Self-pay | Admitting: Psychiatry

## 2020-04-25 DIAGNOSIS — G4701 Insomnia due to medical condition: Secondary | ICD-10-CM

## 2020-04-25 DIAGNOSIS — F4310 Post-traumatic stress disorder, unspecified: Secondary | ICD-10-CM

## 2020-04-25 DIAGNOSIS — F410 Panic disorder [episodic paroxysmal anxiety] without agoraphobia: Secondary | ICD-10-CM | POA: Diagnosis not present

## 2020-04-25 DIAGNOSIS — F3177 Bipolar disorder, in partial remission, most recent episode mixed: Secondary | ICD-10-CM

## 2020-04-25 DIAGNOSIS — I25118 Atherosclerotic heart disease of native coronary artery with other forms of angina pectoris: Secondary | ICD-10-CM

## 2020-04-25 DIAGNOSIS — Z8249 Family history of ischemic heart disease and other diseases of the circulatory system: Secondary | ICD-10-CM

## 2020-04-25 MED ORDER — LAMOTRIGINE 25 MG PO TABS
25.0000 mg | ORAL_TABLET | Freq: Two times a day (BID) | ORAL | 1 refills | Status: DC
Start: 2020-04-25 — End: 2020-06-22

## 2020-04-25 MED ORDER — ARIPIPRAZOLE 10 MG PO TABS: 5.0000 mg | ORAL_TABLET | Freq: Every day | ORAL | 0 refills | Status: DC

## 2020-04-25 MED ORDER — TRAZODONE HCL 100 MG PO TABS: 100.0000 mg | ORAL_TABLET | Freq: Every day | ORAL | 0 refills | Status: DC

## 2020-04-25 NOTE — Progress Notes (Signed)
Virtual Visit via Video Note  I connected with [name] on 04/25/20 at  9:00 AM EDT by a video enabled telemedicine application and verified that I am speaking with the correct person using two identifiers.  Location Provider Location : [facility] Patient Location : Home  Participants: Patient , Provider   I discussed the limitations of evaluation and management by telemedicine and the availability of in person appointments. The patient expressed understanding and agreed to proceed.    I discussed the assessment and treatment plan with the patient. The patient was provided an opportunity to ask questions and all were answered. The patient agreed with the plan and demonstrated an understanding of the instructions.   The patient was advised to call back or seek an in-person evaluation if the symptoms worsen or if the condition fails to improve as anticipated.   [place] MD OP Progress Note  04/25/2020 10:12 AM Ashley Fields  MRN:  [id number]  Chief Complaint:  Chief Complaint    Follow-up; Anxiety; Insomnia     HPI: Ashley Fields is a 65 year old Caucasian female, married, on disability, lives in [place] has a history of bipolar disorder, PTSD, chronic pain, migraine headaches, insomnia was evaluated by telemedicine today.  Patient today reports she has noticed improvement in her irritability and mood swings since being on the Lamictal.  She reports she is also tolerating the reduced dosage of Abilify well.  She is sleeping well on the combination of trazodone and melatonin.  She was able to spend some time with her son, grandchild and sister in [place] and just got back.  She reports she does have relationship struggles with the sister who stays with her right now.  She however reports they were able to communicate and that has been going well.  Patient denies any suicidality, homicidality or perceptual disturbances.  Patient is compliant on medications.  She does have pain, chronic pain  however is currently on tramadol and follows up with her providers for managing it for her.  Patient denies any other concerns today.  Visit Diagnosis:    ICD-10-CM   1. Bipolar disorder, in partial remission, most recent episode mixed (HCC)  F31.77 ARIPiprazole (ABILIFY) 10 MG tablet    lamoTRIgine (LAMICTAL) 25 MG tablet  2. PTSD (post-traumatic stress disorder)  F43.10 ARIPiprazole (ABILIFY) 10 MG tablet  3. Panic attacks  F41.0 ARIPiprazole (ABILIFY) 10 MG tablet  4. Insomnia due to medical condition  G47.01 traZODone (DESYREL) 100 MG tablet   mood, pain    Past Psychiatric History: I have reviewed her past psychiatric history from my progress note on 05/15/2018.  Past trials of Prozac, Wellbutrin, Lexapro, Cymbalta, Rexulti, Ambien-nightmares  Past Medical History:  Past Medical History:  Diagnosis Date  . Allergy   . Anemia   . Anxiety   . Arthritis    Yes  . Blood transfusion without reported diagnosis 1977   Transfusions from miscarriage & hemorrhaging  . Chronic kidney disease   . Depression   . Disp fx of cuboid bone of right foot, init for clos fx 01/01/2017  . GERD (gastroesophageal reflux disease)   . Headache   . Hepatitis C   . Hyperlipidemia   . Neuromuscular disorder ([place]) 1997   Falling to much was an eye-opener  . Osteoporosis   . Thyroid disease   . Urine incontinence     Past Surgical History:  Procedure Laterality Date  . 5 miscarriages     1977-1985, Blood transfusion s/p miscarriage 1977  .  ABDOMINAL HYSTERECTOMY  1987   Total  . APPENDECTOMY  12/2008  . AUGMENTATION MAMMAPLASTY Bilateral 2015   Bilat  . AUGMENTATION MAMMAPLASTY Bilateral 2018   implants redone w/ placement of implants under muscle  . breast lift bilateral, implants  11/91/4782   bilateral, silicon naturel  . BREAST REDUCTION SURGERY Bilateral 1997  . CESAREAN SECTION  07/27/1981   Placenta Previa  . CHOLECYSTECTOMY  03/2004   Lap surgery  . COSMETIC SURGERY     Breast  implants w/ lift, tummy tuck, upper & lower Blepharop  . EYE SURGERY  2017   Cataract surgery & lasik  . GASTRIC BYPASS  2005   Laparoscopic  . IVC FILTER INSERTION  12/2003   TrapEase Vena Cava Filter  . LUMBAR LAMINECTOMY  06/2006   L4-L5 (spinal fusion)  . mini tummy tuck  05/07/2016   Bilateral bra/back roll lift skin removal  . neck surgery C3-C7  02/10/2015   ACDF  . REDUCTION MAMMAPLASTY Bilateral 1997  . SHOULDER ACROMIOPLASTY Left 03/27/2013   w/ labral debridement  . SMALL INTESTINE SURGERY  12/24/2003   Lap Gastric Bypass  . SPINAL CORD STIMULATOR IMPLANT  11/2010   removed 05/2011  . SPINE SURGERY  2008 2017   Fusion L4-L5, ACDF C4-C7    Family Psychiatric History: I have reviewed family psychiatric history from my progress note on 05/15/2018  Family History:  Family History  Problem Relation Age of Onset  . COPD Mother   . Lung cancer Mother   . Diabetes Mother   . Heart disease Mother   . Stroke Mother   . Heart attack Mother   . Arthritis Mother   . Cancer Mother   . Hypertension Mother   . Obesity Mother   . Varicose Veins Mother   . Heart disease Father   . Heart attack Father 16  . Early death Father   . Depression Sister        x 5 sisters  . COPD Sister   . Hypertension Sister   . Heart disease Brother        x 3 brothers  . Diabetes Brother   . Colon polyps Sister   . Hearing loss Sister   . Heart attack Sister   . Heart attack Brother   . Arthritis Sister   . Varicose Veins Sister   . Arthritis Brother   . Heart disease Brother   . Hypertension Brother   . Cancer Maternal Grandfather   . Stroke Maternal Grandfather   . COPD Sister   . Obesity Sister   . Depression Sister   . Depression Sister   . Heart disease Sister   . Hypertension Sister   . Diabetes Brother   . Heart disease Brother   . Hypertension Brother   . Obesity Brother   . Varicose Veins Brother   . Heart disease Brother   . Hypertension Brother   . Obesity  Brother   . Hypertension Sister   . Obesity Sister   . Miscarriages / Stillbirths Maternal Aunt   . Miscarriages / Stillbirths Paternal Aunt   . Breast cancer Neg Hx     Social History: I have reviewed social history from my progress note on 05/15/2018 Social History   Socioeconomic History  . Marital status: Married    Spouse name: Not on file  . Number of children: 1  . Years of education: [employer]  . Highest education level: Some college, no degree  Occupational  History  . Occupation: disability  Tobacco Use  . Smoking status: Former Smoker    Packs/day: 0.25    Years: 10.00    Pack years: 2.50    Types: Cigarettes    Quit date: 11/29/1995    Years since quitting: 24.4  . Smokeless tobacco: Former Systems developer  . Tobacco comment: Quite 1997, didnt smoke hardly at all when I was smoking.  Vaping Use  . Vaping Use: Never used  Substance and Sexual Activity  . Alcohol use: No  . Drug use: No  . Sexual activity: Yes    Birth control/protection: Condom, Post-menopausal, Surgical    Comment: Total Hysterectomy condoms  Other Topics Concern  . 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 Difficulty of Paying Living Expenses: Not hard at all  Food Insecurity: No Food Insecurity  . Worried About Charity fundraiser in the Last Year: Never true  . Ran Out of Food in the Last Year: Never true  Transportation Needs: No Transportation Needs  . Lack of Transportation (Medical): No  . Lack of Transportation (Non-Medical): No  Physical Activity: Inactive  . Days of Exercise per Week: 0 days  . Minutes of Exercise per Session: 0 min  Stress: Stress Concern Present  . Feeling of Stress : Very much  Social Connections: Not on file    Allergies:  Allergies  Allergen Reactions  . Flagyl [Metronidazole]  Anaphylaxis  . Cymbalta [Duloxetine Hcl]   . Tape Rash    Metabolic Disorder Labs: Lab Results  Component Value Date   HGBA1C 5.0 03/30/2020   MPG 97 03/30/2020   MPG 97 03/17/2019   No results found for: PROLACTIN Lab Results  Component Value Date   CHOL 152 03/30/2020   TRIG 67 03/30/2020   HDL 89 03/30/2020   CHOLHDL 1.7 03/30/2020   LDLCALC 49 03/30/2020   LDLCALC 42 09/22/2019   Lab Results  Component Value Date   TSH 1.97 03/30/2020   TSH 0.86 08/11/2018    Therapeutic Level Labs: No results found for: LITHIUM No results found for: VALPROATE No components found for:  CBMZ  Current Medications: Current Outpatient Medications  Medication Sig Dispense Refill  . traMADol (ULTRAM) 50 MG tablet Take 50 mg by mouth 4 (four) times daily.    Marland Kitchen albuterol (VENTOLIN HFA) 108 (90 Base) MCG/ACT inhaler Inhale 2 puffs into the lungs every 4 (four) hours as needed for wheezing or shortness of breath (cough). 8 g 2  . ARIPiprazole (ABILIFY) 10 MG tablet Take 0.5 tablets (5 mg total) by mouth daily. 90 tablet 0  . butalbital-acetaminophen-caffeine (FIORICET) 50-325-40 MG tablet Take 1 tablet by mouth 2 (two) times daily as needed for headache.    . [facility] ASPIRIN LOW DOSE 81 MG EC tablet TAKE 1 TABLET BY MOUTH EVERY DAY 90 tablet 3  . cyanocobalamin (,VITAMIN B-12,) 1000 MCG/ML injection Inject 1,000 mcg into the muscle every 30 (thirty) days.    . diclofenac Sodium (VOLTAREN) 1 % GEL Apply 2 g topically 4 (four) times daily.    Marland Kitchen estradiol (ESTRACE) 1 MG tablet TAKE ONE TABLET BY MOUTH DAILY 90 tablet 0  . etodolac (LODINE) 500 MG tablet TAKE 1 TABLET BY MOUTH TWICE A DAY 180 tablet 1  . ezetimibe (ZETIA) 10 MG tablet TAKE ONE TABLET  BY MOUTH ONE TIME DAILY 90 tablet 0  . fluticasone (FLONASE) 50 MCG/ACT nasal spray SPRAY 2 SPRAYS INTO EACH NOSTRIL EVERY DAY 48 mL 3  . furosemide (LASIX) 20 MG tablet Take 1 tablet (20 mg total) by mouth daily as needed for fluid or edema. 90 tablet 3   . gabapentin (NEURONTIN) 600 MG tablet Take 1 tablet (600 mg total) by mouth 3 (three) times daily. 90 tablet 3  . hydrOXYzine (ATARAX/VISTARIL) 25 MG tablet TAKE 0.5-1 TABLETS (12.5-25 MG TOTAL) BY MOUTH 2 (TWO) TIMES DAILY AS NEEDED FOR ANXIETY. FOR SEVERE PANIC ATTACKS ONLY 180 tablet 1  . lamoTRIgine (LAMICTAL) 25 MG tablet Take 1 tablet (25 mg total) by mouth 2 (two) times daily. 60 tablet 1  . omeprazole (PRILOSEC) 40 MG capsule TAKE 1 CAPSULE BY MOUTH EVERY DAY 90 capsule 1  . ondansetron (ZOFRAN ODT) 4 MG disintegrating tablet Take 1 tablet (4 mg total) by mouth every 8 (eight) hours as needed for nausea or vomiting. 30 tablet 2  . OZEMPIC, 1 MG/DOSE, 4 MG/3ML SOPN INJECT 1 MG INTO THE SKIN ONCE A WEEK. 3 mL 1  . phentermine (ADIPEX-P) 37.5 MG tablet Take 37.5 mg by mouth at bedtime.    . propranolol (INDERAL) 10 MG tablet TAKE ONE TABLET BY MOUTH THREE TIMES DAILY as needed for severe anxiety attacks (limited supply) 30 tablet 0  . rosuvastatin (CRESTOR) 40 MG tablet Take 1 tablet (40 mg total) by mouth daily. 90 tablet 3  . tiZANidine (ZANAFLEX) 4 MG tablet Take 4 mg by mouth 4 (four) times daily.    . traZODone (DESYREL) 100 MG tablet Take 1 tablet (100 mg total) by mouth at bedtime. 90 tablet 0  . venlafaxine XR (EFFEXOR-XR) 37.5 MG 24 hr capsule TAKE 1 CAPSULE BY MOUTH DAILY WITH BREAKFAST, TO BE COMBINED WITH 75 MG CAPSULE 90 capsule 0  . venlafaxine XR (EFFEXOR-XR) 75 MG 24 hr capsule TAKE ONE CAPSULE BY MOUTH DAILY with breakfast 90 capsule 0  . XIIDRA 5 % SOLN INSTILL 1 DROP INTO BOTH EYES TWICE A DAY     No current facility-administered medications for this visit.     Musculoskeletal: Strength & Muscle Tone: UTA Gait & Station: UTA Patient leans: N/A  Psychiatric Specialty Exam: Review of Systems  Musculoskeletal: Positive for back pain.  Psychiatric/Behavioral:       Irritable - improving  All other systems reviewed and are negative.   There were no vitals taken  for this visit.There is no height or weight on file to calculate BMI.  General Appearance: Casual  Eye Contact:  Fair  Speech:  Clear and Coherent  Volume:  Normal  Mood:  Irritable  Affect:  Appropriate  Thought Process:  Goal Directed and Descriptions of Associations: Intact  Orientation:  Full (Time, Place, and Person)  Thought Content: Logical   Suicidal Thoughts:  No  Homicidal Thoughts:  No  Memory:  Immediate;   Fair Recent;   Fair Remote;   Fair  Judgement:  Fair  Insight:  Fair  Psychomotor Activity:  Normal  Concentration:  Concentration: Fair and Attention Span: Fair  Recall:  [employer] of Knowledge: Fair  Language: Fair  Akathisia:  No  Handed:  Right  AIMS (if indicated): UTA  Assets:  Communication Skills Desire for Improvement Housing Social Support  ADL's:  Intact  Cognition: WNL  Sleep:  Fair   Screenings: GAD-7   Flowsheet Row Office Visit from 12/28/2019 in [facility] Office Visit from 03/17/2019 in [facility] Office Visit from 11/20/2018 in [facility] Office Visit from 08/18/2018 in [facility] Office Visit from 07/22/2018 in [facility]  Total GAD-7 Score 2 2 4 9 8     PHQ2-9   Flowsheet Row Video Visit from 04/25/2020 in [place] from 03/29/2020 in [facility] Office Visit from 12/28/2019 in [facility] Office Visit from 09/21/2019 in [facility] Office Visit from 06/18/2019 in [place]  PHQ-2 Total Score 2 4 0 0 0  PHQ-9 Total Score 5 9 1  0 --       Assessment and Plan: Ashley Fields is a 65 year old Caucasian female who has a history of PTSD, bipolar disorder, panic attacks, migraine headaches, chronic pain was evaluated by telemedicine today.  Patient is currently making progress and will continue to benefit from medication management.  Plan as noted  below.  Plan Bipolar disorder mixed, mild-improving Reduce Abilify to 5 mg p.o. daily.  Long-term plan is to taper it off. Increase Lamictal to 25 mg p.o. twice daily Venlafaxine extended release 112.5 mg p.o. daily Gabapentin 600 mg p.o. 3 times daily-prescribed by primary care provider for pain  PTSD-improving Venlafaxine 112.5 mg p.o. daily Hydroxyzine 25 mg p.o. daily as needed for severe anxiety attacks Patient was referred for CBT-provided mental health Associates information in the past.  Insomnia-stable Trazodone 100 mg p.o. nightly Melatonin 10 mg p.o. nightly  Follow up in clinic in 1 month or sooner if needed.  This note was generated in part or whole with voice recognition software. Voice recognition is usually quite accurate but there are transcription errors that can and very often do occur. I apologize for any typographical errors that were not detected and corrected.       Ursula Alert, MD 04/25/2020, 10:12 AM

## 2020-04-27 ENCOUNTER — Encounter: Payer: Self-pay | Admitting: Family Medicine

## 2020-04-27 ENCOUNTER — Other Ambulatory Visit: Payer: Self-pay

## 2020-04-27 ENCOUNTER — Encounter: Payer: 59 | Admitting: Family Medicine

## 2020-04-27 ENCOUNTER — Ambulatory Visit (INDEPENDENT_AMBULATORY_CARE_PROVIDER_SITE_OTHER): Payer: 59 | Admitting: Family Medicine

## 2020-04-27 VITALS — BP 138/75 | HR 91 | Ht 62.0 in | Wt 187.0 lb

## 2020-04-27 DIAGNOSIS — Z9884 Bariatric surgery status: Secondary | ICD-10-CM | POA: Diagnosis not present

## 2020-04-27 DIAGNOSIS — F3176 Bipolar disorder, in full remission, most recent episode depressed: Secondary | ICD-10-CM

## 2020-04-27 DIAGNOSIS — E559 Vitamin D deficiency, unspecified: Secondary | ICD-10-CM

## 2020-04-27 DIAGNOSIS — F4310 Post-traumatic stress disorder, unspecified: Secondary | ICD-10-CM

## 2020-04-27 DIAGNOSIS — E538 Deficiency of other specified B group vitamins: Secondary | ICD-10-CM

## 2020-04-27 DIAGNOSIS — I25118 Atherosclerotic heart disease of native coronary artery with other forms of angina pectoris: Secondary | ICD-10-CM

## 2020-04-27 DIAGNOSIS — E782 Mixed hyperlipidemia: Secondary | ICD-10-CM

## 2020-04-27 DIAGNOSIS — Z0000 Encounter for general adult medical examination without abnormal findings: Secondary | ICD-10-CM | POA: Diagnosis not present

## 2020-04-27 DIAGNOSIS — Z8619 Personal history of other infectious and parasitic diseases: Secondary | ICD-10-CM

## 2020-04-27 DIAGNOSIS — I739 Peripheral vascular disease, unspecified: Secondary | ICD-10-CM | POA: Insufficient documentation

## 2020-04-27 DIAGNOSIS — E669 Obesity, unspecified: Secondary | ICD-10-CM

## 2020-04-27 NOTE — Assessment & Plan Note (Signed)
Stable chronic problem Followed by Psychiatry Dr Shea Evans On med management, doses adjusted last visit see HPI

## 2020-04-27 NOTE — Progress Notes (Signed)
Subjective:    Patient ID: Ashley Fields, female    DOB: 1955/05/14, 65 y.o.   MRN: [id number]  Ashley Fields is a 65 y.o. female presenting on 04/27/2020 for Annual Exam and Back Pain   HPI   Here for Annual Physical and Lab Review.  Vitamin D/ B12 History of gastric bypass On Vitamin D OTC and B12 injection, requesting lab check vitamin levels They recommend that she keep B12 >500  Morbid Obesity BMI >34 HLD CAD Fam history CAD Chronic problem with weight gain. Doing well on Ozempic higher 1mg  weekly with weight loss. Maintains her CBG as well. A1c 5.1 normal range. Not having hypoglycemia.  She has history of prior significant morbid obesity and s/p gastric bypass surgery in 2005. She had done well overall but still has had some issues with continued weight gain over the years. Now recent had worsening. She is on variety of other medications for her mood currently and followed by Dr Shea Evans at [facility].  - For lifestyle, she continues to limit portions / calories, reduce starch/fats in diet - No history of Pre-Diabetes or DIabetes  Hyperlipidemia Chronic problem. Complication of obesity as well. On Rosuvastatin and Zetia currently Good results on last lipid panel. Followed by Cardiology  FOLLOW-UPBipolar Disorder in full remission / PTSD / Anxiety see prior note for background. Followed by Dr Ferdie Ping Psychiatry - She is doing well currently, no new concerns, continues on current medsAbilify HALF of 10mg  = 5mg  daily, and continued on Trazodone, Venlafaxine. Propranolol PRN dose inc for anxiety PRN. - Lamictal was inc to 25mg  BID for mood stabilizing - Goal is to taper down on Abilify in future. See PHQ below  Chronic Pain Tizanidine 4mg  x 4 times daily PRN pain now for back pain.  History of Hepatitis C Previous RUQ Abd [place] negative 07/2019  New problem Dysphagia Chronic issue with difficulty swallowing dysphagia, has had chronic problem since had neck surgery in  past, has muscle / nerve issue with neck and causes spasm, difficulty swallowing. Asking about evaluation.  Health Maintenance: UTD Colon CA Screening - cologuard negative 08/05/17 - 07/2020  UTD Mammogram 08/2019 UTD TDap 2019   Depression screen [facility] 2/9 03/29/2020 12/28/2019 09/21/2019  Decreased Interest 1 0 0  Down, Depressed, Hopeless 3 0 0  PHQ - 2 Score 4 0 0  Altered sleeping 2 1 0  Tired, decreased energy 3 0 0  Change in appetite 0 0 0  Feeling bad or failure about yourself  0 0 0  Trouble concentrating 0 0 0  Moving slowly or fidgety/restless 0 0 0  Suicidal thoughts 0 0 0  PHQ-9 Score 9 1 0  Difficult doing work/chores Somewhat difficult Not difficult at all Not difficult at all  Some encounter information is confidential and restricted. Go to Review Flowsheets activity to see all data.  Some recent data might be hidden    Past Medical History:  Diagnosis Date  . Allergy   . Anemia   . Anxiety   . Arthritis    Yes  . Blood transfusion without reported diagnosis 1977   Transfusions from miscarriage & hemorrhaging  . Chronic kidney disease   . Depression   . Disp fx of cuboid bone of right foot, init for clos fx 01/01/2017  . GERD (gastroesophageal reflux disease)   . Headache   . Hepatitis C   . Hyperlipidemia   . Neuromuscular disorder ([place]) 1997   Falling to much was an eye-opener  .  Osteoporosis   . Thyroid disease   . Urine incontinence    Past Surgical History:  Procedure Laterality Date  . 5 miscarriages     1977-1985, Blood transfusion s/p miscarriage 1977  . ABDOMINAL HYSTERECTOMY  1987   Total  . APPENDECTOMY  12/2008  . AUGMENTATION MAMMAPLASTY Bilateral 2015   Bilat  . AUGMENTATION MAMMAPLASTY Bilateral 2018   implants redone w/ placement of implants under muscle  . breast lift bilateral, implants  02/54/2706   bilateral, silicon naturel  . BREAST REDUCTION SURGERY Bilateral 1997  . CESAREAN SECTION  07/27/1981   Placenta Previa  .  CHOLECYSTECTOMY  03/2004   Lap surgery  . COSMETIC SURGERY     Breast implants w/ lift, tummy tuck, upper & lower Blepharop  . EYE SURGERY  2017   Cataract surgery & lasik  . GASTRIC BYPASS  2005   Laparoscopic  . IVC FILTER INSERTION  12/2003   TrapEase Vena Cava Filter  . LUMBAR LAMINECTOMY  06/2006   L4-L5 (spinal fusion)  . mini tummy tuck  05/07/2016   Bilateral bra/back roll lift skin removal  . neck surgery C3-C7  02/10/2015   ACDF  . REDUCTION MAMMAPLASTY Bilateral 1997  . SHOULDER ACROMIOPLASTY Left 03/27/2013   w/ labral debridement  . SMALL INTESTINE SURGERY  12/24/2003   Lap Gastric Bypass  . SPINAL CORD STIMULATOR IMPLANT  11/2010   removed 05/2011  . SPINE SURGERY  2008 2017   Fusion L4-L5, ACDF C4-C7   Social History   Socioeconomic History  . Marital status: Married    Spouse name: Not on file  . Number of children: 1  . Years of education: [employer]  . Highest education level: Some college, no degree  Occupational History  . Occupation: disability  Tobacco Use  . Smoking status: Former Smoker    Packs/day: 0.25    Years: 10.00    Pack years: 2.50    Types: Cigarettes    Quit date: 11/29/1995    Years since quitting: 24.4  . Smokeless tobacco: Former Systems developer  . Tobacco comment: Quite 1997, didnt smoke hardly at all when I was smoking.  Vaping Use  . Vaping Use: Never used  Substance and Sexual Activity  . Alcohol use: No  . Drug use: No  . Sexual activity: Yes    Birth control/protection: Condom, Post-menopausal, Surgical    Comment: Total Hysterectomy condoms  Other Topics Concern  . 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 Difficulty of Paying Living Expenses: Not hard at all  Food Insecurity: No Food Insecurity  . Worried About Charity fundraiser in the Last  Year: Never true  . Ran Out of Food in the Last Year: Never true  Transportation Needs: No Transportation Needs  . Lack of Transportation (Medical): No  . Lack of Transportation (Non-Medical): No  Physical Activity: Inactive  . Days of Exercise per Week: 0 days  . Minutes of Exercise per Session: 0 min  Stress: Stress Concern Present  . Feeling of Stress : Very much  Social Connections: Not on file  Intimate Partner Violence: Not on file   Family History  Problem Relation Age of Onset  . COPD Mother   . Lung cancer Mother   . Diabetes Mother   . Heart disease  Mother   . Stroke Mother   . Heart attack Mother   . Arthritis Mother   . Cancer Mother   . Hypertension Mother   . Obesity Mother   . Varicose Veins Mother   . Heart disease Father   . Heart attack Father 81  . Early death Father   . Depression Sister        x 5 sisters  . COPD Sister   . Hypertension Sister   . Heart disease Brother        x 3 brothers  . Diabetes Brother   . Colon polyps Sister   . Hearing loss Sister   . Heart attack Sister   . Heart attack Brother   . Arthritis Sister   . Varicose Veins Sister   . Arthritis Brother   . Heart disease Brother   . Hypertension Brother   . Cancer Maternal Grandfather   . Stroke Maternal Grandfather   . COPD Sister   . Obesity Sister   . Depression Sister   . Depression Sister   . Heart disease Sister   . Hypertension Sister   . Diabetes Brother   . Heart disease Brother   . Hypertension Brother   . Obesity Brother   . Varicose Veins Brother   . Heart disease Brother   . Hypertension Brother   . Obesity Brother   . Hypertension Sister   . Obesity Sister   . Miscarriages / Stillbirths Maternal Aunt   . Miscarriages / Stillbirths Paternal Aunt   . Breast cancer Neg Hx    Current Outpatient Medications on File Prior to Visit  Medication Sig  . albuterol (VENTOLIN HFA) 108 (90 Base) MCG/ACT inhaler Inhale 2 puffs into the lungs every 4 (four)  hours as needed for wheezing or shortness of breath (cough).  . ARIPiprazole (ABILIFY) 10 MG tablet Take 0.5 tablets (5 mg total) by mouth daily.  . butalbital-acetaminophen-caffeine (FIORICET) 50-325-40 MG tablet Take 1 tablet by mouth 2 (two) times daily as needed for headache.  . [facility] ASPIRIN LOW DOSE 81 MG EC tablet TAKE 1 TABLET BY MOUTH EVERY DAY  . cyanocobalamin (,VITAMIN B-12,) 1000 MCG/ML injection Inject 1,000 mcg into the muscle every 30 (thirty) days.  . diclofenac Sodium (VOLTAREN) 1 % GEL Apply 2 g topically 4 (four) times daily.  Marland Kitchen estradiol (ESTRACE) 1 MG tablet TAKE ONE TABLET BY MOUTH DAILY  . etodolac (LODINE) 500 MG tablet TAKE 1 TABLET BY MOUTH TWICE A DAY  . ezetimibe (ZETIA) 10 MG tablet TAKE ONE TABLET BY MOUTH ONE TIME DAILY  . fluticasone (FLONASE) 50 MCG/ACT nasal spray SPRAY 2 SPRAYS INTO EACH NOSTRIL EVERY DAY  . furosemide (LASIX) 20 MG tablet Take 1 tablet (20 mg total) by mouth daily as needed for fluid or edema.  . gabapentin (NEURONTIN) 600 MG tablet Take 1 tablet (600 mg total) by mouth 3 (three) times daily.  . hydrOXYzine (ATARAX/VISTARIL) 25 MG tablet TAKE 0.5-1 TABLETS (12.5-25 MG TOTAL) BY MOUTH 2 (TWO) TIMES DAILY AS NEEDED FOR ANXIETY. FOR SEVERE PANIC ATTACKS ONLY  . lamoTRIgine (LAMICTAL) 25 MG tablet Take 1 tablet (25 mg total) by mouth 2 (two) times daily.  Marland Kitchen omeprazole (PRILOSEC) 40 MG capsule TAKE 1 CAPSULE BY MOUTH EVERY DAY  . ondansetron (ZOFRAN ODT) 4 MG disintegrating tablet Take 1 tablet (4 mg total) by mouth every 8 (eight) hours as needed for nausea or vomiting.  Marland Kitchen OZEMPIC, 1 MG/DOSE, 4 MG/3ML SOPN INJECT 1 MG  INTO THE SKIN ONCE A WEEK.  . phentermine (ADIPEX-P) 37.5 MG tablet Take 37.5 mg by mouth at bedtime.  . propranolol (INDERAL) 10 MG tablet TAKE ONE TABLET BY MOUTH THREE TIMES DAILY as needed for severe anxiety attacks (limited supply)  . rosuvastatin (CRESTOR) 40 MG tablet Take 1 tablet (40 mg total) by mouth daily.  Marland Kitchen tiZANidine  (ZANAFLEX) 4 MG tablet Take 4 mg by mouth 4 (four) times daily.  . traMADol (ULTRAM) 50 MG tablet Take 50 mg by mouth 4 (four) times daily.  . traZODone (DESYREL) 100 MG tablet Take 1 tablet (100 mg total) by mouth at bedtime.  Marland Kitchen venlafaxine XR (EFFEXOR-XR) 37.5 MG 24 hr capsule TAKE 1 CAPSULE BY MOUTH DAILY WITH BREAKFAST, TO BE COMBINED WITH 75 MG CAPSULE  . venlafaxine XR (EFFEXOR-XR) 75 MG 24 hr capsule TAKE ONE CAPSULE BY MOUTH DAILY with breakfast  . XIIDRA 5 % SOLN INSTILL 1 DROP INTO BOTH EYES TWICE A DAY   No current facility-administered medications on file prior to visit.    Review of Systems  Constitutional: Negative for activity change, appetite change, chills, diaphoresis, fatigue and fever.  HENT: Negative for congestion and hearing loss.   Eyes: Negative for visual disturbance.  Respiratory: Negative for apnea, cough, chest tightness, shortness of breath and wheezing.   Cardiovascular: Negative for chest pain, palpitations and leg swelling.  Gastrointestinal: Negative for abdominal pain, constipation, diarrhea, nausea and vomiting.       Dysphagia  Endocrine: Negative for cold intolerance.  Genitourinary: Negative for dysuria, frequency and hematuria.       Post void mild dribble incontinence  Musculoskeletal: Negative for arthralgias and neck pain.  Skin: Negative for rash.  Allergic/Immunologic: Negative for environmental allergies.  Neurological: Negative for dizziness, weakness, light-headedness, numbness and headaches.  Hematological: Negative for adenopathy.  Psychiatric/Behavioral: Negative for behavioral problems, dysphoric mood and sleep disturbance.   Per HPI unless specifically indicated above      Objective:    BP 138/75   Pulse 91   Ht 5\' 2"  (1.575 m)   Wt 187 lb (84.8 kg)   SpO2 100%   BMI 34.20 kg/m   Wt Readings from Last 3 Encounters:  04/27/20 187 lb (84.8 kg)  03/29/20 184 lb (83.5 kg)  01/21/20 178 lb (80.7 kg)    Physical Exam     Results for orders placed or performed in visit on 03/29/20  VITAMIN D 25 Hydroxy (Vit-D Deficiency, Fractures)  Result Value Ref Range   Vit D, 25-Hydroxy 55 30 - 100 ng/mL  Vitamin B12  Result Value Ref Range   Vitamin B-12 482 200 - 1,100 pg/mL  Thyroid Panel With TSH  Result Value Ref Range   T3 Uptake 27 22 - 35 %   T4, Total 8.1 5.1 - 11.9 mcg/dL   Free Thyroxine Index 2.2 1.4 - 3.8   TSH 1.97 0.40 - 4.50 mIU/L  Lipid panel  Result Value Ref Range   Cholesterol 152 <200 mg/dL   HDL 89 > OR = 50 mg/dL   Triglycerides 67 <150 mg/dL   LDL Cholesterol (Calc) 49 mg/dL (calc)   Total CHOL/HDL Ratio 1.7 <5.0 (calc)   Non-HDL Cholesterol (Calc) 63 <130 mg/dL (calc)  COMPLETE METABOLIC PANEL WITH GFR  Result Value Ref Range   Glucose, Bld 85 65 - 99 mg/dL   BUN 15 7 - 25 mg/dL   Creat 0.96 0.50 - 0.99 mg/dL   GFR, Est Non African American 63 > OR =  60 mL/min/1.69m2   GFR, Est African American 73 > OR = 60 mL/min/1.59m2   BUN/Creatinine Ratio NOT APPLICABLE 6 - 22 (calc)   Sodium 142 135 - 146 mmol/L   Potassium 4.6 3.5 - 5.3 mmol/L   Chloride 103 98 - 110 mmol/L   CO2 32 20 - 32 mmol/L   Calcium 9.4 8.6 - 10.4 mg/dL   Total Protein 6.8 6.1 - 8.1 g/dL   Albumin 4.3 3.6 - 5.1 g/dL   Globulin 2.5 1.9 - 3.7 g/dL (calc)   AG Ratio 1.7 1.0 - 2.5 (calc)   Total Bilirubin 0.8 0.2 - 1.2 mg/dL   Alkaline phosphatase (APISO) 48 37 - 153 U/L   AST 26 10 - 35 U/L   ALT 23 6 - 29 U/L  CBC with Differential/Platelet  Result Value Ref Range   WBC 4.1 3.8 - 10.8 Thousand/uL   RBC 4.72 3.80 - 5.10 Million/uL   Hemoglobin 14.4 11.7 - 15.5 g/dL   HCT 43.1 35.0 - 45.0 %   MCV 91.3 80.0 - 100.0 fL   MCH 30.5 27.0 - 33.0 pg   MCHC 33.4 32.0 - 36.0 g/dL   RDW 12.4 11.0 - 15.0 %   Platelets 167 140 - 400 Thousand/uL   MPV 9.9 7.5 - 12.5 fL   Neutro Abs 2,259 1,500 - 7,800 cells/uL   Lymphs Abs 1,292 850 - 3,900 cells/uL   Absolute Monocytes 349 200 - 950 cells/uL   Eosinophils  Absolute 160 15 - 500 cells/uL   Basophils Absolute 41 0 - 200 cells/uL   Neutrophils Relative % 55.1 %   Total Lymphocyte 31.5 %   Monocytes Relative 8.5 %   Eosinophils Relative 3.9 %   Basophils Relative 1.0 %  Hemoglobin A1c  Result Value Ref Range   Hgb A1c MFr Bld 5.0 <5.7 % of total Hgb   Mean Plasma Glucose 97 mg/dL   eAG (mmol/L) 5.4 mmol/L      Assessment & Plan:   Problem List Items Addressed This Visit    S/P gastric bypass    Followed by Bariatric On Vitamin B12 therapy Labs reviewed      PTSD (post-traumatic stress disorder)    Stable chronic problem Followed by Psychiatry Dr Shea Evans On med management, doses adjusted last visit see HPI      PAD (peripheral artery disease) (HCC)    Stable chronic problem On Statin therapy + Zetia      Obesity (BMI 30.0-34.9)    Improved Controlled wt on GLP1 therapy      Mixed hyperlipidemia    Controlled cholesterol on statin and lifestyletherapy - this is preventative Last lipid panel 4./2022 Prior hollenhorst plaque, followed by Cardiology  Plan: 1. Continue current meds - Rosuvastatin 40mg  + Zetia 10mg  daily 2. Continue ASA 81mg  for primary ASCVD risk reduction 3. Encourage improved lifestyle - low carb/cholesterol, reduce portion size, continue improving regular exercise  Check lipids today  Asked her to f/u with Cardiology discuss if needs Zetia still      History of hepatitis C   Relevant Orders   US Abdomen Limited RUQ (LIVER/GB)   Bipolar disorder, in full remission, most recent episode depressed ([place])    Controlled Managed by Psychiatry On med management      Atherosclerosis of native coronary artery of native heart with stable angina pectoris ([place])    Followed by Cardiology On med management       Other Visit Diagnoses    Annual physical exam    -  Primary   Vitamin D deficiency       Vitamin B12 deficiency         Updated Health Maintenance information Reviewed recent lab results with  patient Encouraged improvement to lifestyle with diet and exercise Goal of weight loss  Hx Hep C Surveillance Liver US ordered at [facility] 07/2020, to be scheduled.  Colon CA Screening due Cologuard 07/2020 for repeat, after 3 years., since last done 2019  #Dysphagia Question if related to outer neck muscles from prior neck surgery may have impacted nerve / muscle function or question if other cause of dysphagia. Will refer to GI when patient is ready.   No orders of the defined types were placed in this encounter.     Follow up plan: Return in about 6 months (around 10/27/2020) for 6 month follow-up updates, HTN, Mood, Pain, Swallowing updates GI.  Nobie Putnam, [facility] 04/27/2020, 9:27 AM

## 2020-04-27 NOTE — Assessment & Plan Note (Signed)
Followed by Cardiology On med management 

## 2020-04-27 NOTE — Assessment & Plan Note (Signed)
Improved Controlled wt on GLP1 therapy

## 2020-04-27 NOTE — Assessment & Plan Note (Signed)
Controlled Managed by Psychiatry On med management

## 2020-04-27 NOTE — Assessment & Plan Note (Signed)
Controlled cholesterol on statin and lifestyletherapy - this is preventative Last lipid panel 4./2022 Prior hollenhorst plaque, followed by Cardiology  Plan: 1. Continue current meds - Rosuvastatin 40mg  + Zetia 10mg  daily 2. Continue ASA 81mg  for primary ASCVD risk reduction 3. Encourage improved lifestyle - low carb/cholesterol, reduce portion size, continue improving regular exercise  Check lipids today  Asked her to f/u with Cardiology discuss if needs Zetia still

## 2020-04-27 NOTE — Patient Instructions (Addendum)
Thank you for coming to the office today.  Let me know on mychart when ready for consultation from GI for dysphagia or difficulty swallowing may need a Speech Language Pathology SLP swallow study.  Keep up the good work overall.  Ordered Abdominal US RUQ for liver - should be done in July 2022, at outpatient imaging center.  Stay tuned or call them if not heard back.   Please schedule a Follow-up Appointment to: Return in about 6 months (around 10/27/2020) for 6 month follow-up updates, HTN, Mood, Pain, Swallowing updates GI.  If you have any other questions or concerns, please feel free to call the office or send a message through [place]. You may also schedule an earlier appointment if necessary.  Additionally, you may be receiving a survey about your experience at our office within a few days to 1 week by e-mail or mail. We value your feedback.  Nobie Putnam, DO [place]

## 2020-04-27 NOTE — Assessment & Plan Note (Signed)
Followed by Bariatric On Vitamin B12 therapy Labs reviewed

## 2020-04-27 NOTE — Assessment & Plan Note (Signed)
Stable chronic problem On Statin therapy + Zetia

## 2020-05-03 DIAGNOSIS — R1310 Dysphagia, unspecified: Secondary | ICD-10-CM

## 2020-05-03 DIAGNOSIS — Z981 Arthrodesis status: Secondary | ICD-10-CM

## 2020-05-04 ENCOUNTER — Ambulatory Visit
Admission: RE | Admit: 2020-05-04 | Discharge: 2020-05-04 | Disposition: A | Discharge status: Home / Self Care | Payer: 59 | Source: Ambulatory Visit | Attending: Family Medicine | Admitting: Family Medicine

## 2020-05-04 ENCOUNTER — Other Ambulatory Visit: Payer: Self-pay

## 2020-05-04 ENCOUNTER — Encounter: Payer: Self-pay | Admitting: *Deleted

## 2020-05-04 ENCOUNTER — Telehealth: Payer: Self-pay

## 2020-05-04 DIAGNOSIS — Z8619 Personal history of other infectious and parasitic diseases: Secondary | ICD-10-CM | POA: Diagnosis not present

## 2020-05-04 NOTE — Telephone Encounter (Signed)
Ashley Fields could you pls print out a letter excusing her from jury duty . Thank you

## 2020-05-04 NOTE — Telephone Encounter (Signed)
pt called asked about getting a letter for jury duty.

## 2020-05-04 NOTE — Telephone Encounter (Signed)
Done

## 2020-05-05 ENCOUNTER — Other Ambulatory Visit: Payer: Self-pay | Admitting: Cardiovascular Disease

## 2020-05-05 DIAGNOSIS — R609 Edema, unspecified: Secondary | ICD-10-CM

## 2020-05-08 DIAGNOSIS — D229 Melanocytic nevi, unspecified: Secondary | ICD-10-CM

## 2020-05-17 ENCOUNTER — Other Ambulatory Visit: Payer: Self-pay | Admitting: Psychiatry

## 2020-05-17 DIAGNOSIS — F3177 Bipolar disorder, in partial remission, most recent episode mixed: Secondary | ICD-10-CM

## 2020-05-19 ENCOUNTER — Other Ambulatory Visit: Payer: Self-pay | Admitting: Family Medicine

## 2020-05-19 ENCOUNTER — Other Ambulatory Visit: Payer: Self-pay | Admitting: Psychiatry

## 2020-05-19 DIAGNOSIS — F410 Panic disorder [episodic paroxysmal anxiety] without agoraphobia: Secondary | ICD-10-CM

## 2020-05-19 DIAGNOSIS — G894 Chronic pain syndrome: Secondary | ICD-10-CM

## 2020-05-19 DIAGNOSIS — F4310 Post-traumatic stress disorder, unspecified: Secondary | ICD-10-CM

## 2020-05-19 DIAGNOSIS — F3176 Bipolar disorder, in full remission, most recent episode depressed: Secondary | ICD-10-CM

## 2020-05-19 DIAGNOSIS — G8929 Other chronic pain: Secondary | ICD-10-CM

## 2020-05-19 NOTE — Telephone Encounter (Signed)
Requested Prescriptions  Pending Prescriptions Disp Refills  . gabapentin (NEURONTIN) 600 MG tablet [Pharmacy Med Name: Gabapentin Oral Tablet 600 MG] 90 tablet 0    Sig: TAKE ONE TABLET BY MOUTH THREE TIMES DAILY     Neurology: Anticonvulsants - gabapentin Passed - 05/19/2020  8:12 AM      Passed - Valid encounter within last 12 months    Recent Outpatient Visits          3 weeks ago Annual physical exam   [place], DO   3 months ago Acute viral pharyngitis   [facility], Devonne Doughty, DO   4 months ago Morbid obesity [facility])   [place], DO   5 months ago Acute pain of left knee   [place], DO   5 months ago Hordeolum internum of right upper eyelid   [place], DO      Future Appointments            In 10 months [facility], [facility]

## 2020-05-25 ENCOUNTER — Other Ambulatory Visit: Payer: Self-pay | Admitting: Psychiatry

## 2020-05-25 DIAGNOSIS — F3177 Bipolar disorder, in partial remission, most recent episode mixed: Secondary | ICD-10-CM

## 2020-05-26 ENCOUNTER — Other Ambulatory Visit: Payer: Self-pay

## 2020-05-26 ENCOUNTER — Telehealth (INDEPENDENT_AMBULATORY_CARE_PROVIDER_SITE_OTHER): Payer: 59 | Admitting: Psychiatry

## 2020-05-26 ENCOUNTER — Encounter: Payer: Self-pay | Admitting: Psychiatry

## 2020-05-26 DIAGNOSIS — F4310 Post-traumatic stress disorder, unspecified: Secondary | ICD-10-CM

## 2020-05-26 DIAGNOSIS — F410 Panic disorder [episodic paroxysmal anxiety] without agoraphobia: Secondary | ICD-10-CM

## 2020-05-26 DIAGNOSIS — F3178 Bipolar disorder, in full remission, most recent episode mixed: Secondary | ICD-10-CM

## 2020-05-26 DIAGNOSIS — G4701 Insomnia due to medical condition: Secondary | ICD-10-CM | POA: Diagnosis not present

## 2020-05-26 NOTE — Progress Notes (Signed)
Virtual Visit via Video Note  I connected with Ashley Fields on 05/26/20 at  9:00 AM EDT by a video enabled telemedicine application and verified that I am speaking with the correct person using two identifiers.  Location Provider Location : [facility] Patient Location : Home  Participants: Patient , Provider   I discussed the limitations of evaluation and management by telemedicine and the availability of in person appointments. The patient expressed understanding and agreed to proceed.    I discussed the assessment and treatment plan with the patient. The patient was provided an opportunity to ask questions and all were answered. The patient agreed with the plan and demonstrated an understanding of the instructions.   The patient was advised to call back or seek an in-person evaluation if the symptoms worsen or if the condition fails to improve as anticipated.   [place] MD OP Progress Note  05/26/2020 9:09 AM Challis Crill  MRN:  [id number]  Chief Complaint:  Chief Complaint    Follow-up     HPI: Ashley Fields is a 65 year old Caucasian female, married, on disability, lives in [facility], has a history of bipolar disorder, PTSD, chronic pain, migraine headaches, insomnia was evaluated by telemedicine today.  Patient reports since being on the higher dosage of lamotrigine irritability has improved a lot.  She reports her mood swings are currently manageable.  It does not affect her functioning.  Patient reports she continues to sleep at around 10 PM and wakes up at around 4:30 AM.  She reports the only reason she wakes up at around that time is because her husband leaves for work at that time.  She does take short naps during the day when it is possible.  She feels rested when she wakes up in the morning.  She continues to take trazodone.  Patient is on a lower dosage of Abilify and reports that has not worsened her mood symptoms and she is tolerating the dose reduction well.  She reports she is  taking a lower dosage of venlafaxine since the past 3 weeks, 75 mg and is tolerating it well.  Patient denies any suicidality, homicidality or perceptual disturbances.  Patient continues to have chronic pain, she recently got injections to her back and is on tramadol 200 mg p.o. daily.  She continues to follow-up with pain provider.  Patient denies any other concerns today.  Visit Diagnosis:    ICD-10-CM   1. Bipolar disorder, in full remission, most recent episode mixed ([place])  F31.78   2. PTSD (post-traumatic stress disorder)  F43.10   3. Panic attacks  F41.0   4. Insomnia due to medical condition  G47.01    mood, pain    Past Psychiatric History: I have reviewed past psychiatric history from progress note on 05/15/2018.  Past trials of Prozac, Wellbutrin, Lexapro, Cymbalta, Rexulti, Ambien-nightmares  Past Medical History:  Past Medical History:  Diagnosis Date  . Allergy   . Anemia   . Anxiety   . Arthritis    Yes  . Blood transfusion without reported diagnosis 1977   Transfusions from miscarriage & hemorrhaging  . Chronic kidney disease   . Depression   . Disp fx of cuboid bone of right foot, init for clos fx 01/01/2017  . GERD (gastroesophageal reflux disease)   . Headache   . Hepatitis C   . Hyperlipidemia   . Neuromuscular disorder ([place]) 1997   Falling to much was an eye-opener  . Osteoporosis   . Thyroid disease   .  Urine incontinence     Past Surgical History:  Procedure Laterality Date  . 5 miscarriages     1977-1985, Blood transfusion s/p miscarriage 1977  . ABDOMINAL HYSTERECTOMY  1987   Total  . APPENDECTOMY  12/2008  . AUGMENTATION MAMMAPLASTY Bilateral 2015   Bilat  . AUGMENTATION MAMMAPLASTY Bilateral 2018   implants redone w/ placement of implants under muscle  . breast lift bilateral, implants  42/59/5638   bilateral, silicon naturel  . BREAST REDUCTION SURGERY Bilateral 1997  . CESAREAN SECTION  07/27/1981   Placenta Previa  . CHOLECYSTECTOMY   03/2004   Lap surgery  . COSMETIC SURGERY     Breast implants w/ lift, tummy tuck, upper & lower Blepharop  . EYE SURGERY  2017   Cataract surgery & lasik  . GASTRIC BYPASS  2005   Laparoscopic  . IVC FILTER INSERTION  12/2003   TrapEase Vena Cava Filter  . LUMBAR LAMINECTOMY  06/2006   L4-L5 (spinal fusion)  . mini tummy tuck  05/07/2016   Bilateral bra/back roll lift skin removal  . neck surgery C3-C7  02/10/2015   ACDF  . REDUCTION MAMMAPLASTY Bilateral 1997  . SHOULDER ACROMIOPLASTY Left 03/27/2013   w/ labral debridement  . SMALL INTESTINE SURGERY  12/24/2003   Lap Gastric Bypass  . SPINAL CORD STIMULATOR IMPLANT  11/2010   removed 05/2011  . SPINE SURGERY  2008 2017   Fusion L4-L5, ACDF C4-C7    Family Psychiatric History: I have reviewed family psychiatric history from progress note on 05/15/2018  Family History:  Family History  Problem Relation Age of Onset  . COPD Mother   . Lung cancer Mother   . Diabetes Mother   . Heart disease Mother   . Stroke Mother   . Heart attack Mother   . Arthritis Mother   . Cancer Mother   . Hypertension Mother   . Obesity Mother   . Varicose Veins Mother   . Heart disease Father   . Heart attack Father 81  . Early death Father   . Depression Sister        x 5 sisters  . COPD Sister   . Hypertension Sister   . Heart disease Brother        x 3 brothers  . Diabetes Brother   . Colon polyps Sister   . Hearing loss Sister   . Heart attack Sister   . Heart attack Brother   . Arthritis Sister   . Varicose Veins Sister   . Arthritis Brother   . Heart disease Brother   . Hypertension Brother   . Cancer Maternal Grandfather   . Stroke Maternal Grandfather   . COPD Sister   . Obesity Sister   . Depression Sister   . Depression Sister   . Heart disease Sister   . Hypertension Sister   . Diabetes Brother   . Heart disease Brother   . Hypertension Brother   . Obesity Brother   . Varicose Veins Brother   . Heart  disease Brother   . Hypertension Brother   . Obesity Brother   . Hypertension Sister   . Obesity Sister   . Miscarriages / Stillbirths Maternal Aunt   . Miscarriages / Stillbirths Paternal Aunt   . Breast cancer Neg Hx     Social History: Reviewed social history from progress note on 05/15/2018 Social History   Socioeconomic History  . Marital status: Married    Spouse name: Not on  file  . Number of children: 1  . Years of education: [employer]  . Highest education level: Some college, no degree  Occupational History  . Occupation: disability  Tobacco Use  . Smoking status: Former Smoker    Packs/day: 0.25    Years: 10.00    Pack years: 2.50    Types: Cigarettes    Quit date: 11/29/1995    Years since quitting: 24.5  . Smokeless tobacco: Former Systems developer  . Tobacco comment: Quite 1997, didnt smoke hardly at all when I was smoking.  Vaping Use  . Vaping Use: Never used  Substance and Sexual Activity  . Alcohol use: No  . Drug use: No  . Sexual activity: Yes    Birth control/protection: Condom, Post-menopausal, Surgical    Comment: Total Hysterectomy condoms  Other Topics Concern  . 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 Difficulty of Paying Living Expenses: Not hard at all  Food Insecurity: No Food Insecurity  . Worried About Charity fundraiser in the Last Year: Never true  . Ran Out of Food in the Last Year: Never true  Transportation Needs: No Transportation Needs  . Lack of Transportation (Medical): No  . Lack of Transportation (Non-Medical): No  Physical Activity: Inactive  . Days of Exercise per Week: 0 days  . Minutes of Exercise per Session: 0 min  Stress: Stress Concern Present  . Feeling of Stress : Very much  Social Connections: Not on file    Allergies:  Allergies   Allergen Reactions  . Flagyl [Metronidazole] Anaphylaxis  . Cymbalta [Duloxetine Hcl]   . Tape Rash    Metabolic Disorder Labs: Lab Results  Component Value Date   HGBA1C 5.0 03/30/2020   MPG 97 03/30/2020   MPG 97 03/17/2019   No results found for: PROLACTIN Lab Results  Component Value Date   CHOL 152 03/30/2020   TRIG 67 03/30/2020   HDL 89 03/30/2020   CHOLHDL 1.7 03/30/2020   LDLCALC 49 03/30/2020   LDLCALC 42 09/22/2019   Lab Results  Component Value Date   TSH 1.97 03/30/2020   TSH 0.86 08/11/2018    Therapeutic Level Labs: No results found for: LITHIUM No results found for: VALPROATE No components found for:  CBMZ  Current Medications: Current Outpatient Medications  Medication Sig Dispense Refill  . albuterol (VENTOLIN HFA) 108 (90 Base) MCG/ACT inhaler Inhale 2 puffs into the lungs every 4 (four) hours as needed for wheezing or shortness of breath (cough). 8 g 2  . ARIPiprazole (ABILIFY) 10 MG tablet Take 0.5 tablets (5 mg total) by mouth daily. 90 tablet 0  . butalbital-acetaminophen-caffeine (FIORICET) 50-325-40 MG tablet Take 1 tablet by mouth 2 (two) times daily as needed for headache.    . [facility] ASPIRIN LOW DOSE 81 MG EC tablet TAKE 1 TABLET BY MOUTH EVERY DAY 90 tablet 3  . cyanocobalamin (,VITAMIN B-12,) 1000 MCG/ML injection Inject 1,000 mcg into the muscle every 30 (thirty) days.    . diazepam (VALIUM) 5 MG tablet Valium 5 mg tablet  1-2 tabs pre-procedure  take medicatin 40 min before the injection, do not drive    . diclofenac Sodium (VOLTAREN) 1 % GEL Apply 2 g topically 4 (four) times daily.    Marland Kitchen estradiol (ESTRACE) 1 MG tablet TAKE ONE  TABLET BY MOUTH DAILY 90 tablet 0  . etodolac (LODINE) 500 MG tablet TAKE 1 TABLET BY MOUTH TWICE A DAY 180 tablet 1  . ezetimibe (ZETIA) 10 MG tablet TAKE ONE TABLET BY MOUTH ONE TIME DAILY 90 tablet 0  . fluticasone (FLONASE) 50 MCG/ACT nasal spray SPRAY 2 SPRAYS INTO EACH NOSTRIL EVERY DAY 48 mL 3  .  furosemide (LASIX) 20 MG tablet TAKE 1 TABLET (20 MG TOTAL) BY MOUTH DAILY AS NEEDED FOR FLUID OR EDEMA. 90 tablet 1  . gabapentin (NEURONTIN) 600 MG tablet TAKE ONE TABLET BY MOUTH THREE TIMES DAILY 90 tablet 0  . hydrOXYzine (ATARAX/VISTARIL) 25 MG tablet TAKE 0.5-1 TABLETS (12.5-25 MG TOTAL) BY MOUTH 2 (TWO) TIMES DAILY AS NEEDED FOR ANXIETY. FOR SEVERE PANIC ATTACKS ONLY 180 tablet 1  . lamoTRIgine (LAMICTAL) 25 MG tablet Take 1 tablet (25 mg total) by mouth 2 (two) times daily. 60 tablet 1  . mometasone (ELOCON) 0.1 % cream Apply 1 application topically 2 (two) times daily.    Marland Kitchen omeprazole (PRILOSEC) 40 MG capsule TAKE 1 CAPSULE BY MOUTH EVERY DAY 90 capsule 1  . ondansetron (ZOFRAN ODT) 4 MG disintegrating tablet Take 1 tablet (4 mg total) by mouth every 8 (eight) hours as needed for nausea or vomiting. 30 tablet 2  . OZEMPIC, 1 MG/DOSE, 4 MG/3ML SOPN INJECT 1 MG INTO THE SKIN ONCE A WEEK. 3 mL 1  . phentermine (ADIPEX-P) 37.5 MG tablet Take 37.5 mg by mouth at bedtime.    . propranolol (INDERAL) 10 MG tablet TAKE ONE TABLET BY MOUTH THREE TIMES DAILY as needed for severe anxiety attacks (limited supply) 30 tablet 0  . rosuvastatin (CRESTOR) 40 MG tablet Take 1 tablet (40 mg total) by mouth daily. 90 tablet 3  . tiZANidine (ZANAFLEX) 4 MG tablet Take 4 mg by mouth 4 (four) times daily.    . traMADol (ULTRAM) 50 MG tablet Take 50 mg by mouth 4 (four) times daily.    . traZODone (DESYREL) 100 MG tablet Take 1 tablet (100 mg total) by mouth at bedtime. 90 tablet 0  . venlafaxine XR (EFFEXOR-XR) 75 MG 24 hr capsule TAKE ONE CAPSULE BY MOUTH DAILY with breakfast 90 capsule 0  . XIIDRA 5 % SOLN INSTILL 1 DROP INTO BOTH EYES TWICE A DAY     No current facility-administered medications for this visit.     Musculoskeletal: Strength & Muscle Tone: UTA Gait & Station: UTA Patient leans: N/A  Psychiatric Specialty Exam: Review of Systems  Musculoskeletal: Positive for back pain.   Psychiatric/Behavioral:       Mood swings - improved  All other systems reviewed and are negative.   There were no vitals taken for this visit.There is no height or weight on file to calculate BMI.  General Appearance: Casual  Eye Contact:  Fair  Speech:  Clear and Coherent  Volume:  Normal  Mood:  Mood swings improving  Affect:  Congruent  Thought Process:  Goal Directed and Descriptions of Associations: Intact  Orientation:  Full (Time, Place, and Person)  Thought Content: Logical   Suicidal Thoughts:  No  Homicidal Thoughts:  No  Memory:  Immediate;   Fair Recent;   Fair Remote;   Fair  Judgement:  Fair  Insight:  Fair  Psychomotor Activity:  Normal  Concentration:  Concentration: Fair and Attention Span: Fair  Recall:  [employer] of Knowledge: Fair  Language: Fair  Akathisia:  No  Handed:  Right  AIMS (  if indicated): UTA  Assets:  Communication Skills Desire for Improvement Housing Intimacy Social Support Talents/Skills Transportation Vocational/Educational  ADL's:  Intact  Cognition: WNL  Sleep:  Fair   Screenings: GAD-7   Flowsheet Row Office Visit from 12/28/2019 in [facility] Office Visit from 03/17/2019 in [facility] Office Visit from 11/20/2018 in [facility] Office Visit from 08/18/2018 in [facility] Office Visit from 07/22/2018 in [facility]  Total GAD-7 Score 2 2 4 9 8     PHQ2-9   Flowsheet Row Video Visit from 04/25/2020 in [facility] from 03/29/2020 in [facility] Office Visit from 12/28/2019 in [facility] Office Visit from 09/21/2019 in [facility] Office Visit from 06/18/2019 in [place]  PHQ-2 Total Score 2 4 0 0 0  PHQ-9 Total Score 5 9 1  0 --       Assessment and Plan: Louanne Calvillo is a 64 year old Caucasian female who has a history of PTSD, bipolar  disorder, panic attacks, migraine headaches, chronic pain was evaluated by telemedicine today.  Patient is currently stable.  Plan as noted below.  Plan Bipolar disorder mixed mild in remission Abilify 5 mg p.o. daily. Lamictal 25 mg p.o. twice daily Patient reports she is taking venlafaxine extended release 75 mg p.o. daily and tolerating it well.  Continue the same dose Gabapentin 600 mg p.o. 3 times daily-prescribed by primary care provider for pain  PTSD- stable Venlafaxine 75 mg p.o. daily Hydroxyzine 25 mg p.o. daily as needed for severe anxiety attacks   Insomnia-stable Trazodone 100 mg p.o. nightly Melatonin 10 mg p.o. nightly  Continue pain management with her pain provider.  Follow-up in clinic in 2 months or sooner if needed.  This note was generated in part or whole with voice recognition software. Voice recognition is usually quite accurate but there are transcription errors that can and very often do occur. I apologize for any typographical errors that were not detected and corrected.       Ursula Alert, MD 05/26/2020, 9:09 AM

## 2020-05-30 ENCOUNTER — Ambulatory Visit (INDEPENDENT_AMBULATORY_CARE_PROVIDER_SITE_OTHER): Payer: 59 | Admitting: Gastroenterology

## 2020-05-30 ENCOUNTER — Other Ambulatory Visit: Payer: Self-pay

## 2020-05-30 ENCOUNTER — Encounter: Payer: Self-pay | Admitting: Gastroenterology

## 2020-05-30 VITALS — BP 102/71 | HR 84 | Wt 188.0 lb

## 2020-05-30 DIAGNOSIS — R1319 Other dysphagia: Secondary | ICD-10-CM

## 2020-05-30 DIAGNOSIS — Z1211 Encounter for screening for malignant neoplasm of colon: Secondary | ICD-10-CM

## 2020-05-30 MED ORDER — NA SULFATE-K SULFATE-MG SULF 17.5-3.13-1.6 GM/177ML PO SOLN: 1.0000 | Freq: Once | ORAL | 0 refills | Status: AC

## 2020-05-30 NOTE — Progress Notes (Signed)
Ashley Fields [address]  [place]  [place], [place] [address]  Main: [phone]  Fax: [phone]   Gastroenterology Consultation  Referring Provider:     Nobie Putnam * Primary Care Physician:  Olin Hauser, DO Reason for Consultation:   Dysphagia        HPI:    Chief complaint: Dysphagia  Ashley Fields is a 65 y.o. y/o female referred for consultation & management  by Dr. Parks Ranger, Devonne Doughty, DO.  Patient reports 2-year history of dysphagia to solid foods, GERD occurs 4-5 times a day.  States that she keeps a jug of water next to her at all times and drinks water when the symptoms occur.  No episodes of food impaction.  No prior upper endoscopy.  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  Reports having a colonoscopy in 2015 in [place] and states a couple small polyps were removed and she was asked to follow-up in 5 to 10 years.  Previous colonoscopy report not available and unable to obtain as it states that their office is permanently closed.  No weight loss, no altered bowel habits, no blood in stool.  Past Medical History:  Diagnosis Date  . Allergy   . Anemia   . Anxiety   . Arthritis    Yes  . Blood transfusion without reported diagnosis 1977   Transfusions from miscarriage & hemorrhaging  . Chronic kidney disease   . Depression   . Disp fx of cuboid bone of right foot, init for clos fx 01/01/2017  . GERD (gastroesophageal reflux disease)   . Headache   . Hepatitis C   . Hyperlipidemia   . Neuromuscular disorder ([place]) 1997   Falling to much was an eye-opener  . Osteoporosis   . Thyroid disease   . Urine incontinence     Past Surgical History:  Procedure Laterality Date  . 5 miscarriages     1977-1985, Blood transfusion s/p miscarriage 1977  . ABDOMINAL HYSTERECTOMY  1987   Total  . APPENDECTOMY  12/2008  . AUGMENTATION MAMMAPLASTY Bilateral 2015   Bilat  . AUGMENTATION  MAMMAPLASTY Bilateral 2018   implants redone w/ placement of implants under muscle  . breast lift bilateral, implants  38/75/6433   bilateral, silicon naturel  . BREAST REDUCTION SURGERY Bilateral 1997  . CESAREAN SECTION  07/27/1981   Placenta Previa  . CHOLECYSTECTOMY  03/2004   Lap surgery  . COSMETIC SURGERY     Breast implants w/ lift, tummy tuck, upper & lower Blepharop  . EYE SURGERY  2017   Cataract surgery & lasik  . GASTRIC BYPASS  2005   Laparoscopic  . IVC FILTER INSERTION  12/2003   TrapEase Vena Cava Filter  . LUMBAR LAMINECTOMY  06/2006   L4-L5 (spinal fusion)  . mini tummy tuck  05/07/2016   Bilateral bra/back roll lift skin removal  . neck surgery C3-C7  02/10/2015   ACDF  . REDUCTION MAMMAPLASTY Bilateral 1997  . SHOULDER ACROMIOPLASTY Left 03/27/2013   w/ labral debridement  . SMALL INTESTINE SURGERY  12/24/2003   Lap Gastric Bypass  . SPINAL CORD STIMULATOR IMPLANT  11/2010   removed 05/2011  . SPINE SURGERY  2008 2017   Fusion L4-L5, ACDF C4-C7    Prior to Admission medications   Medication Sig Start Date End Date Taking? Authorizing Provider  albuterol (VENTOLIN HFA) 108 (90 Base) MCG/ACT inhaler Inhale 2 puffs into the lungs every 4 (four) hours  as needed for wheezing or shortness of breath (cough). 01/29/20  Yes Karamalegos, Devonne Doughty, DO  ARIPiprazole (ABILIFY) 10 MG tablet Take 0.5 tablets (5 mg total) by mouth daily. 04/25/20  Yes Ursula Alert, MD  butalbital-acetaminophen-caffeine (FIORICET) 50-325-40 MG tablet Take 1 tablet by mouth 2 (two) times daily as needed for headache.   Yes [provider]  [facility] ASPIRIN LOW DOSE 81 MG EC tablet TAKE 1 TABLET BY MOUTH EVERY DAY 05/26/19  Yes Karamalegos, Devonne Doughty, DO  cyanocobalamin (,VITAMIN B-12,) 1000 MCG/ML injection Inject 1,000 mcg into the muscle every 30 (thirty) days. 08/20/19  Yes [provider]  diazepam (VALIUM) 5 MG tablet Valium 5 mg tablet  1-2 tabs pre-procedure  take  medicatin 40 min before the injection, do not drive   Yes [provider]  diclofenac Sodium (VOLTAREN) 1 % GEL Apply 2 g topically 4 (four) times daily. 10/07/19  Yes [provider]  estradiol (ESTRACE) 1 MG tablet TAKE ONE TABLET BY MOUTH DAILY 12/24/19  Yes Karamalegos, Devonne Doughty, DO  etodolac (LODINE) 500 MG tablet TAKE 1 TABLET BY MOUTH TWICE A DAY 07/15/19  Yes Karamalegos, Devonne Doughty, DO  ezetimibe (ZETIA) 10 MG tablet TAKE ONE TABLET BY MOUTH ONE TIME DAILY 03/25/20  Yes Karamalegos, Devonne Doughty, DO  fluticasone (FLONASE) 50 MCG/ACT nasal spray SPRAY 2 SPRAYS INTO EACH NOSTRIL EVERY DAY 02/18/20  Yes Karamalegos, Alexander J, DO  furosemide (LASIX) 20 MG tablet TAKE 1 TABLET (20 MG TOTAL) BY MOUTH DAILY AS NEEDED FOR FLUID OR EDEMA. 05/05/20  Yes Gollan, Kathlene November, MD  gabapentin (NEURONTIN) 600 MG tablet TAKE ONE TABLET BY MOUTH THREE TIMES DAILY 05/19/20  Yes Karamalegos, Devonne Doughty, DO  hydrOXYzine (ATARAX/VISTARIL) 25 MG tablet TAKE 0.5-1 TABLETS (12.5-25 MG TOTAL) BY MOUTH 2 (TWO) TIMES DAILY AS NEEDED FOR ANXIETY. FOR SEVERE PANIC ATTACKS ONLY 01/26/20  Yes Ursula Alert, MD  lamoTRIgine (LAMICTAL) 25 MG tablet Take 1 tablet (25 mg total) by mouth 2 (two) times daily. 04/25/20  Yes Eappen, Ria Clock, MD  mometasone (ELOCON) 0.1 % cream Apply 1 application topically 2 (two) times daily. 05/12/20  Yes [provider]  omeprazole (PRILOSEC) 40 MG capsule TAKE 1 CAPSULE BY MOUTH EVERY DAY 03/16/20  Yes Karamalegos, Alexander J, DO  ondansetron (ZOFRAN ODT) 4 MG disintegrating tablet Take 1 tablet (4 mg total) by mouth every 8 (eight) hours as needed for nausea or vomiting. 12/16/18  Yes Karamalegos, Alexander J, DO  OZEMPIC, 1 MG/DOSE, 4 MG/3ML SOPN INJECT 1 MG INTO THE SKIN ONCE A WEEK. 04/25/20  Yes Karamalegos, Devonne Doughty, DO  phentermine (ADIPEX-P) 37.5 MG tablet Take 37.5 mg by mouth at bedtime. 11/18/19  Yes [provider]  propranolol (INDERAL) 10 MG tablet  TAKE ONE TABLET BY MOUTH THREE TIMES DAILY as needed for severe anxiety attacks (limited supply) 05/19/20  Yes Eappen, Ria Clock, MD  rosuvastatin (CRESTOR) 40 MG tablet Take 1 tablet (40 mg total) by mouth daily. 10/20/19  Yes Gollan, Kathlene November, MD  tiZANidine (ZANAFLEX) 4 MG tablet Take 4 mg by mouth 4 (four) times daily.   Yes [provider]  traMADol (ULTRAM) 50 MG tablet Take 50 mg by mouth 4 (four) times daily. 11/13/19  Yes [provider]  traZODone (DESYREL) 100 MG tablet Take 1 tablet (100 mg total) by mouth at bedtime. 04/25/20  Yes Ursula Alert, MD  venlafaxine XR (EFFEXOR-XR) 75 MG 24 hr capsule TAKE ONE CAPSULE BY MOUTH DAILY with breakfast 03/16/20  Yes  Ursula Alert, MD  XIIDRA 5 % SOLN INSTILL 1 DROP INTO BOTH EYES TWICE A DAY 02/18/19  Yes [provider]    Family History  Problem Relation Age of Onset  . COPD Mother   . Lung cancer Mother   . Diabetes Mother   . Heart disease Mother   . Stroke Mother   . Heart attack Mother   . Arthritis Mother   . Cancer Mother   . Hypertension Mother   . Obesity Mother   . Varicose Veins Mother   . Heart disease Father   . Heart attack Father 32  . Early death Father   . Depression Sister        x 5 sisters  . COPD Sister   . Hypertension Sister   . Heart disease Brother        x 3 brothers  . Diabetes Brother   . Colon polyps Sister   . Hearing loss Sister   . Heart attack Sister   . Heart attack Brother   . Arthritis Sister   . Varicose Veins Sister   . Arthritis Brother   . Heart disease Brother   . Hypertension Brother   . Cancer Maternal Grandfather   . Stroke Maternal Grandfather   . COPD Sister   . Obesity Sister   . Depression Sister   . Depression Sister   . Heart disease Sister   . Hypertension Sister   . Diabetes Brother   . Heart disease Brother   . Hypertension Brother   . Obesity Brother   . Varicose Veins Brother   . Heart disease Brother   . Hypertension Brother   .  Obesity Brother   . Hypertension Sister   . Obesity Sister   . Miscarriages / Stillbirths Maternal Aunt   . Miscarriages / Stillbirths Paternal Aunt   . Breast cancer Neg Hx      Social History   Tobacco Use  . Smoking status: Former Smoker    Packs/day: 0.25    Years: 10.00    Pack years: 2.50    Types: Cigarettes    Quit date: 11/29/1995    Years since quitting: 24.5  . Smokeless tobacco: Former Systems developer  . Tobacco comment: Quite 1997, didnt smoke hardly at all when I was smoking.  Vaping Use  . Vaping Use: Never used  Substance Use Topics  . Alcohol use: No  . Drug use: No    Allergies as of 05/30/2020 - Review Complete 05/30/2020  Allergen Reaction Noted  . Flagyl [metronidazole] Anaphylaxis 08/14/2016  . Cymbalta [duloxetine hcl]  05/15/2018  . Tape Rash 04/17/2017    Review of Systems:    All systems reviewed and negative except where noted in HPI.   Physical Exam:  BP 102/71   Pulse 84   Wt 188 lb (85.3 kg)   BMI 34.39 kg/m  No LMP recorded. Patient has had a hysterectomy. Psych:  Alert and cooperative. Normal mood and affect. General:   Alert,  Well-developed, well-nourished, pleasant and cooperative in NAD Head:  Normocephalic and atraumatic. Eyes:  Sclera clear, no icterus.   Conjunctiva pink. Ears:  Normal auditory acuity. Nose:  No deformity, discharge, or lesions. Mouth:  No deformity or lesions,oropharynx pink & moist. Neck:  Supple; no masses or thyromegaly. Abdomen:  Normal bowel sounds.  No bruits.  Soft, non-tender and non-distended without masses, hepatosplenomegaly or hernias noted.  No guarding or rebound tenderness.    Msk:  Symmetrical without  gross deformities. Good, equal movement & strength bilaterally. Pulses:  Normal pulses noted. Extremities:  No clubbing or edema.  No cyanosis. Neurologic:  Alert and oriented x3;  grossly normal neurologically. Skin:  Intact without significant lesions or rashes. No jaundice. Lymph Nodes:  No  significant cervical adenopathy. Psych:  Alert and cooperative. Normal mood and affect.   Labs: CBC    Component Value Date/Time   WBC 4.1 03/30/2020 0802   RBC 4.72 03/30/2020 0802   HGB 14.4 03/30/2020 0802   HCT 43.1 03/30/2020 0802   PLT 167 03/30/2020 0802   MCV 91.3 03/30/2020 0802   MCH 30.5 03/30/2020 0802   MCHC 33.4 03/30/2020 0802   RDW 12.4 03/30/2020 0802   LYMPHSABS 1,292 03/30/2020 0802   MONOABS 0.4 03/03/2020 1206   EOSABS 160 03/30/2020 0802   BASOSABS 41 03/30/2020 0802   CMP     Component Value Date/Time   NA 142 03/30/2020 0802   NA 139 08/14/2016 1508   K 4.6 03/30/2020 0802   CL 103 03/30/2020 0802   CO2 32 03/30/2020 0802   GLUCOSE 85 03/30/2020 0802   BUN 15 03/30/2020 0802   BUN 17 08/14/2016 1508   CREATININE 0.96 03/30/2020 0802   CALCIUM 9.4 03/30/2020 0802   PROT 6.8 03/30/2020 0802   PROT 7.5 08/14/2016 1508   ALBUMIN 4.9 (H) 08/14/2016 1508   AST 26 03/30/2020 0802   ALT 23 03/30/2020 0802   ALKPHOS 103 08/14/2016 1508   BILITOT 0.8 03/30/2020 0802   BILITOT 0.2 08/14/2016 1508   GFRNONAA 63 03/30/2020 0802   GFRAA 73 03/30/2020 0802    Imaging Studies: US Abdomen Limited RUQ (LIVER/GB)  Result Date: 05/05/2020 CLINICAL DATA:  Chronic hepatitis-C. EXAM: ULTRASOUND ABDOMEN LIMITED RIGHT UPPER QUADRANT COMPARISON:  07/24/2019 FINDINGS: Gallbladder: Surgically absent. Common bile duct: Diameter: 4 mm, within normal limits. Liver: No focal liver lesions identified. Mildly increased parenchymal echogenicity noted, consistent with mild steatosis. Portal vein is patent on color Doppler imaging with normal direction of blood flow towards the liver. Other: None. IMPRESSION: Mild hepatic steatosis. No hepatic mass identified. Prior cholecystectomy. No evidence of biliary ductal dilatation. Electronically Signed   By: Marlaine Hind M.D.   On: 05/05/2020 09:49    Assessment and Plan:   Ashley Fields is a 65 y.o. y/o female has been referred for  dysphagia  EGD indicated for further evaluation of dysphagia  Patient advised to eat food and small bites, and follow it with liquids  Colonoscopy indicated for screening   I have discussed alternative options, risks & benefits,  which include, but are not limited to, bleeding, infection, perforation,respiratory complication & drug reaction.  The patient agrees with this plan & written consent will be obtained.    Alternative options of conservative management were discussed in detail, including but not limited to medication management, foregoing endoscopic procedures at this time and others.      Dr Ashley Fields  Speech recognition software was used to dictate the above note.

## 2020-05-31 ENCOUNTER — Encounter: Payer: Self-pay | Admitting: Gastroenterology

## 2020-05-31 MED ORDER — PEG 3350-KCL-NA BICARB-NACL 420 G PO SOLR: 4000.0000 mL | Freq: Once | ORAL | 0 refills | Status: DC

## 2020-05-31 MED ORDER — NA SULFATE-K SULFATE-MG SULF 17.5-3.13-1.6 GM/177ML PO SOLN: 1.0000 | Freq: Once | ORAL | 0 refills | Status: AC

## 2020-05-31 NOTE — Addendum Note (Signed)
Addended by: Lurlean Nanny on: 05/31/2020 09:44 AM   Modules accepted: Orders

## 2020-06-04 ENCOUNTER — Other Ambulatory Visit: Payer: Self-pay | Admitting: Family Medicine

## 2020-06-04 DIAGNOSIS — H34211 Partial retinal artery occlusion, right eye: Secondary | ICD-10-CM

## 2020-06-04 DIAGNOSIS — E782 Mixed hyperlipidemia: Secondary | ICD-10-CM

## 2020-06-04 NOTE — Telephone Encounter (Signed)
Requested Prescriptions  Pending Prescriptions Disp Refills  . ASPIRIN LOW DOSE 81 MG EC tablet [Pharmacy Med Name: ASPIRIN EC 81 MG TABLET] 90 tablet 3    Sig: TAKE 1 TABLET BY MOUTH EVERY DAY     Analgesics:  NSAIDS - aspirin Passed - 06/04/2020  8:25 AM      Passed - Patient is not pregnant      Passed - Valid encounter within last 12 months    Recent Outpatient Visits          1 month ago Annual physical exam   [place], DO   4 months ago Acute viral pharyngitis   [place], DO   5 months ago Morbid obesity [facility])   [place], DO   5 months ago Acute pain of left knee   [place], DO   6 months ago Hordeolum internum of right upper eyelid   [place], DO      Future Appointments            In 3 months Bonna Gains, Lennette Bihari, MD [place]   In 10 months  [facility], [facility]

## 2020-06-09 ENCOUNTER — Other Ambulatory Visit: Payer: Self-pay | Admitting: Psychiatry

## 2020-06-09 ENCOUNTER — Other Ambulatory Visit: Payer: Self-pay | Admitting: Family Medicine

## 2020-06-09 DIAGNOSIS — G4701 Insomnia due to medical condition: Secondary | ICD-10-CM

## 2020-06-09 DIAGNOSIS — I25118 Atherosclerotic heart disease of native coronary artery with other forms of angina pectoris: Secondary | ICD-10-CM

## 2020-06-09 DIAGNOSIS — Z8249 Family history of ischemic heart disease and other diseases of the circulatory system: Secondary | ICD-10-CM

## 2020-06-09 NOTE — Telephone Encounter (Signed)
Requested Prescriptions  Pending Prescriptions Disp Refills  . OZEMPIC, 1 MG/DOSE, 4 MG/3ML SOPN [Pharmacy Med Name: OZEMPIC 1 MG/DOSE (4 MG/3 ML)] 3 mL 2    Sig: INJECT 1 MG INTO THE SKIN ONCE A WEEK.     Endocrinology:  Diabetes - GLP-1 Receptor Agonists Passed - 06/09/2020  4:27 AM      Passed - HBA1C is between 0 and 7.9 and within 180 days    Hgb A1c MFr Bld  Date Value Ref Range Status  03/30/2020 5.0 <5.7 % of total Hgb Final    Comment:    For the purpose of screening for the presence of diabetes: . <5.7%       Consistent with the absence of diabetes 5.7-6.4%    Consistent with increased risk for diabetes             (prediabetes) > or =6.5%  Consistent with diabetes . This assay result is consistent with a decreased risk of diabetes. . Currently, no consensus exists regarding use of hemoglobin A1c for diagnosis of diabetes in children. . According to American Diabetes Association (ADA) guidelines, hemoglobin A1c <7.0% represents optimal control in non-pregnant diabetic patients. Different metrics may apply to specific patient populations.  Standards of Medical Care in Diabetes(ADA). Renella Cunas - Valid encounter within last 6 months    Recent Outpatient Visits          1 month ago Annual physical exam   [place], DO   4 months ago Acute viral pharyngitis   [place], DO   5 months ago Morbid obesity [facility])   [place], DO   5 months ago Acute pain of left knee   [place], DO   6 months ago Hordeolum internum of right upper eyelid   [place], DO      Future Appointments            In 2 months Bonna Gains, Lennette Bihari, MD [place]   In 9 months  [facility], [facility]

## 2020-06-13 ENCOUNTER — Encounter: Payer: Self-pay | Admitting: Internal Medicine

## 2020-06-13 ENCOUNTER — Ambulatory Visit
Admission: RE | Admit: 2020-06-13 | Discharge: 2020-06-13 | Disposition: A | Discharge status: Home / Self Care | Payer: 59 | Attending: Gastroenterology | Admitting: Gastroenterology

## 2020-06-13 ENCOUNTER — Encounter: Payer: Self-pay | Admitting: Gastroenterology

## 2020-06-13 ENCOUNTER — Ambulatory Visit: Payer: 59 | Admitting: Certified Registered Nurse Anesthetist

## 2020-06-13 ENCOUNTER — Other Ambulatory Visit: Payer: Self-pay

## 2020-06-13 ENCOUNTER — Encounter
Admission: RE | Disposition: A | Discharge status: Home / Self Care | Payer: Self-pay | Source: Home / Self Care | Attending: Gastroenterology

## 2020-06-13 DIAGNOSIS — D125 Benign neoplasm of sigmoid colon: Secondary | ICD-10-CM | POA: Diagnosis not present

## 2020-06-13 DIAGNOSIS — R1310 Dysphagia, unspecified: Secondary | ICD-10-CM | POA: Insufficient documentation

## 2020-06-13 DIAGNOSIS — N189 Chronic kidney disease, unspecified: Secondary | ICD-10-CM | POA: Insufficient documentation

## 2020-06-13 DIAGNOSIS — Z9884 Bariatric surgery status: Secondary | ICD-10-CM | POA: Insufficient documentation

## 2020-06-13 DIAGNOSIS — Z79899 Other long term (current) drug therapy: Secondary | ICD-10-CM | POA: Insufficient documentation

## 2020-06-13 DIAGNOSIS — K5730 Diverticulosis of large intestine without perforation or abscess without bleeding: Secondary | ICD-10-CM | POA: Diagnosis not present

## 2020-06-13 DIAGNOSIS — Z8261 Family history of arthritis: Secondary | ICD-10-CM | POA: Diagnosis not present

## 2020-06-13 DIAGNOSIS — Z87891 Personal history of nicotine dependence: Secondary | ICD-10-CM | POA: Diagnosis not present

## 2020-06-13 DIAGNOSIS — R1319 Other dysphagia: Secondary | ICD-10-CM | POA: Diagnosis not present

## 2020-06-13 DIAGNOSIS — Z825 Family history of asthma and other chronic lower respiratory diseases: Secondary | ICD-10-CM | POA: Insufficient documentation

## 2020-06-13 DIAGNOSIS — K219 Gastro-esophageal reflux disease without esophagitis: Secondary | ICD-10-CM | POA: Insufficient documentation

## 2020-06-13 DIAGNOSIS — I129 Hypertensive chronic kidney disease with stage 1 through stage 4 chronic kidney disease, or unspecified chronic kidney disease: Secondary | ICD-10-CM | POA: Insufficient documentation

## 2020-06-13 DIAGNOSIS — Z1211 Encounter for screening for malignant neoplasm of colon: Secondary | ICD-10-CM

## 2020-06-13 DIAGNOSIS — Z7982 Long term (current) use of aspirin: Secondary | ICD-10-CM | POA: Insufficient documentation

## 2020-06-13 DIAGNOSIS — Z980 Intestinal bypass and anastomosis status: Secondary | ICD-10-CM | POA: Diagnosis not present

## 2020-06-13 DIAGNOSIS — Z809 Family history of malignant neoplasm, unspecified: Secondary | ICD-10-CM | POA: Insufficient documentation

## 2020-06-13 DIAGNOSIS — Z823 Family history of stroke: Secondary | ICD-10-CM | POA: Insufficient documentation

## 2020-06-13 DIAGNOSIS — K635 Polyp of colon: Secondary | ICD-10-CM | POA: Diagnosis not present

## 2020-06-13 DIAGNOSIS — Z91048 Other nonmedicinal substance allergy status: Secondary | ICD-10-CM | POA: Insufficient documentation

## 2020-06-13 DIAGNOSIS — Z8249 Family history of ischemic heart disease and other diseases of the circulatory system: Secondary | ICD-10-CM | POA: Diagnosis not present

## 2020-06-13 DIAGNOSIS — K648 Other hemorrhoids: Secondary | ICD-10-CM | POA: Insufficient documentation

## 2020-06-13 DIAGNOSIS — K644 Residual hemorrhoidal skin tags: Secondary | ICD-10-CM | POA: Diagnosis not present

## 2020-06-13 DIAGNOSIS — Z833 Family history of diabetes mellitus: Secondary | ICD-10-CM | POA: Insufficient documentation

## 2020-06-13 DIAGNOSIS — Z888 Allergy status to other drugs, medicaments and biological substances status: Secondary | ICD-10-CM | POA: Diagnosis not present

## 2020-06-13 DIAGNOSIS — Q399 Congenital malformation of esophagus, unspecified: Secondary | ICD-10-CM | POA: Diagnosis not present

## 2020-06-13 DIAGNOSIS — E782 Mixed hyperlipidemia: Secondary | ICD-10-CM | POA: Diagnosis not present

## 2020-06-13 HISTORY — PX: ESOPHAGOGASTRODUODENOSCOPY (EGD) WITH PROPOFOL: SHX5813

## 2020-06-13 HISTORY — PX: COLONOSCOPY WITH PROPOFOL: SHX5780

## 2020-06-13 SURGERY — COLONOSCOPY WITH PROPOFOL
Anesthesia: General

## 2020-06-13 MED ORDER — SODIUM CHLORIDE 0.9 % IV SOLN: INTRAVENOUS | Status: DC

## 2020-06-13 MED ORDER — PROPOFOL 500 MG/50ML IV EMUL
INTRAVENOUS | Status: AC
  Filled 2020-06-13: qty 250

## 2020-06-13 MED ORDER — SODIUM CHLORIDE 0.9 % IV SOLN
INTRAVENOUS | Status: DC | PRN
  Administered 2020-06-13: 100 ug via INTRAVENOUS

## 2020-06-13 MED ORDER — LIDOCAINE HCL (CARDIAC) PF 100 MG/5ML IV SOSY
PREFILLED_SYRINGE | INTRAVENOUS | Status: DC | PRN
  Administered 2020-06-13: 100 mg via INTRAVENOUS

## 2020-06-13 MED ORDER — PROPOFOL 500 MG/50ML IV EMUL
INTRAVENOUS | Status: AC
  Filled 2020-06-13: qty 50

## 2020-06-13 MED ORDER — PROPOFOL 10 MG/ML IV BOLUS
INTRAVENOUS | Status: DC | PRN
  Administered 2020-06-13: 60 mg via INTRAVENOUS

## 2020-06-13 MED ORDER — GLYCOPYRROLATE 0.2 MG/ML IJ SOLN
INTRAMUSCULAR | Status: DC | PRN
  Administered 2020-06-13: .2 mg via INTRAVENOUS

## 2020-06-13 MED ORDER — PROPOFOL 500 MG/50ML IV EMUL
INTRAVENOUS | Status: DC | PRN
  Administered 2020-06-13: 150 ug/kg/min via INTRAVENOUS

## 2020-06-13 NOTE — Anesthesia Preprocedure Evaluation (Signed)
Anesthesia Evaluation  Patient identified by MRN, date of birth, ID band Patient awake    Reviewed: Allergy & Precautions, NPO status , Patient's Chart, lab work & pertinent test results  History of Anesthesia Complications Negative for: history of anesthetic complications  Airway Mallampati: III  TM Distance: >3 FB Neck ROM: full    Dental  (+) Upper Dentures, Lower Dentures   Pulmonary shortness of breath and with exertion, COPD, former smoker,    Pulmonary exam normal        Cardiovascular (-) angina+ CAD and + Peripheral Vascular Disease  Normal cardiovascular exam     Neuro/Psych  Headaches, PSYCHIATRIC DISORDERS  Neuromuscular disease    GI/Hepatic Neg liver ROS, GERD  Medicated and Controlled,(+) Hepatitis -  Endo/Other  negative endocrine ROS  Renal/GU Renal disease  negative genitourinary   Musculoskeletal  (+) Arthritis ,   Abdominal   Peds  Hematology negative hematology ROS (+)   Anesthesia Other Findings Past Medical History: No date: Allergy No date: Anemia No date: Anxiety No date: Arthritis     Comment:  Yes 1977: Blood transfusion without reported diagnosis     Comment:  Transfusions from miscarriage & hemorrhaging No date: Chronic kidney disease No date: Depression 01/01/2017: Disp fx of cuboid bone of right foot, init for clos fx No date: GERD (gastroesophageal reflux disease) No date: Headache No date: Hepatitis C No date: Hyperlipidemia 1997: Neuromuscular disorder ([place])     Comment:  Falling to much was an eye-opener No date: Osteoporosis No date: Thyroid disease No date: Urine incontinence  Past Surgical History: No date: 5 miscarriages     Comment:  1977-1985, Blood transfusion s/p miscarriage 1977 1987: ABDOMINAL HYSTERECTOMY     Comment:  Total 12/2008: APPENDECTOMY 2015: AUGMENTATION MAMMAPLASTY; Bilateral     Comment:  Bilat 2018: AUGMENTATION MAMMAPLASTY; Bilateral      Comment:  implants redone w/ placement of implants under muscle 05/18/2015: breast lift bilateral, implants     Comment:  bilateral, silicon naturel 8527: BREAST REDUCTION SURGERY; Bilateral 07/27/1981: CESAREAN SECTION     Comment:  Placenta Previa 03/2004: CHOLECYSTECTOMY     Comment:  Lap surgery No date: COSMETIC SURGERY     Comment:  Breast implants w/ lift, tummy tuck, upper & lower               Blepharop 2017: EYE SURGERY     Comment:  Cataract surgery & lasik 2005: GASTRIC BYPASS     Comment:  Laparoscopic 12/2003: IVC FILTER INSERTION     Comment:  TrapEase Vena Cava Filter 06/2006: LUMBAR LAMINECTOMY     Comment:  L4-L5 (spinal fusion) 05/07/2016: mini tummy tuck     Comment:  Bilateral bra/back roll lift skin removal 02/10/2015: neck surgery C3-C7     Comment:  ACDF 1997: REDUCTION MAMMAPLASTY; Bilateral 03/27/2013: SHOULDER ACROMIOPLASTY; Left     Comment:  w/ labral debridement 12/24/2003: SMALL INTESTINE SURGERY     Comment:  Lap Gastric Bypass 11/2010: SPINAL CORD STIMULATOR IMPLANT     Comment:  removed 05/2011 2008 2017: SPINE SURGERY     Comment:  Fusion L4-L5, ACDF C4-C7  BMI    Body Mass Index: 33.11 kg/m      Reproductive/Obstetrics negative OB ROS                             Anesthesia Physical Anesthesia Plan  ASA: III  Anesthesia Plan: General   Post-op Pain  Management:    Induction: Intravenous  PONV Risk Score and Plan: Propofol infusion and TIVA  Airway Management Planned: Natural Airway and Nasal Cannula  Additional Equipment:   Intra-op Plan:   Post-operative Plan:   Informed Consent: I have reviewed the patients History and Physical, chart, labs and discussed the procedure including the risks, benefits and alternatives for the proposed anesthesia with the patient or authorized representative who has indicated his/her understanding and acceptance.     Dental Advisory Given  Plan Discussed with:  Anesthesiologist, CRNA and Surgeon  Anesthesia Plan Comments: (Patient consented for risks of anesthesia including but not limited to:  - adverse reactions to medications - risk of airway placement if required - damage to eyes, teeth, lips or other oral mucosa - nerve damage due to positioning  - sore throat or hoarseness - Damage to heart, brain, nerves, lungs, other parts of body or loss of life  Patient voiced understanding.)        Anesthesia Quick Evaluation

## 2020-06-13 NOTE — Op Note (Signed)
[facility] Patient Name: Ashley Fields Procedure Date: 06/13/2020 7:06 AM MRN: [id number] Account #: [id number] Date of Birth: May 31, 1955 Admit Type: Outpatient Age: 65 Room: [facility] ENDO ROOM 2 Gender: Female Note Status: Finalized Procedure:             Upper GI endoscopy Indications:           Dysphagia Providers:             Binyomin Brann B. Bonna Gains MD, MD Referring MD:          Olin Hauser (Referring MD) Medicines:             Monitored Anesthesia Care Complications:         No immediate complications. Procedure:             Pre-Anesthesia Assessment:                        - The risks and benefits of the procedure and the                         sedation options and risks were discussed with the                         patient. All questions were answered and informed                         consent was obtained.                        - Patient identification and proposed procedure were                         verified prior to the procedure.                        - ASA Grade Assessment: II - A patient with mild                         systemic disease.                        After obtaining informed consent, the endoscope was                         passed under direct vision. Throughout the procedure,                         the patient's blood pressure, pulse, and oxygen                         saturations were monitored continuously. The Endoscope                         was introduced through the mouth, and advanced to the                         jejunum. The upper GI endoscopy was accomplished with                         ease. The patient tolerated the  procedure well. Findings:      The distal esophagus was mildly tortuous. Biopsies were obtained from       the proximal and distal esophagus with cold forceps for histology of       suspected eosinophilic esophagitis.      The exam of the esophagus was otherwise normal.       Evidence of a Roux-en-Y gastrojejunostomy was found. The gastrojejunal       anastomosis was characterized by healthy appearing mucosa. This was       traversed. The pouch-to-jejunum limb was characterized by healthy       appearing mucosa. The jejunojejunal anastomosis was characterized by       healthy appearing mucosa.      The exam of the stomach was otherwise normal.      Exam of the jejunum was otherwise normal. Impression:            - Tortuous esophagus.                        - Roux-en-Y gastrojejunostomy with gastrojejunal                         anastomosis characterized by healthy appearing mucosa. Recommendation:        - Await pathology results.                        - Consider manometry study if dysphagia continues and                         esophageal biopsies are unremarkable.                        - Continue present medications.                        - Return to my office as previously scheduled.                        - Return to primary care physician as previously                         scheduled.                        - The findings and recommendations were discussed with                         the patient.                        - The findings and recommendations were discussed with                         the patient's family. Procedure Code(s):     --- Professional ---                        [phone], Esophagogastroduodenoscopy, flexible,                         transoral; with biopsy, single or multiple Diagnosis Code(s):     --- Professional ---  Q39.9, Congenital malformation of esophagus,                         unspecified                        Z98.0, Intestinal bypass and anastomosis status                        R13.10, Dysphagia, unspecified CPT copyright 2019 American Medical Association. All rights reserved. The codes documented in this report are preliminary and upon coder review may  be revised to meet current compliance  requirements.  Vonda Antigua, MD Margretta Sidle B. Bonna Gains MD, MD 06/13/2020 8:36:15 AM This report has been signed electronically. Number of Addenda: 0 Note Initiated On: 06/13/2020 7:06 AM Estimated Blood Loss:  Estimated blood loss: none.      [facility]

## 2020-06-13 NOTE — H&P (Signed)
Ashley Antigua, MD [address], [place], [place], [place], [address] [address], [place], [place], [place], [address] Phone: [phone]  Fax: [phone]  Primary Care Physician:  Olin Hauser, DO   Pre-Procedure History & Physical: HPI:  Ashley Fields is a 65 y.o. female is here for a colonoscopy and EGD.   Past Medical History:  Diagnosis Date  . Allergy   . Anemia   . Anxiety   . Arthritis    Yes  . Blood transfusion without reported diagnosis 1977   Transfusions from miscarriage & hemorrhaging  . Chronic kidney disease   . Depression   . Disp fx of cuboid bone of right foot, init for clos fx 01/01/2017  . GERD (gastroesophageal reflux disease)   . Headache   . Hepatitis C   . Hyperlipidemia   . Neuromuscular disorder ([place]) 1997   Falling to much was an eye-opener  . Osteoporosis   . Thyroid disease   . Urine incontinence     Past Surgical History:  Procedure Laterality Date  . 5 miscarriages     1977-1985, Blood transfusion s/p miscarriage 1977  . ABDOMINAL HYSTERECTOMY  1987   Total  . APPENDECTOMY  12/2008  . AUGMENTATION MAMMAPLASTY Bilateral 2015   Bilat  . AUGMENTATION MAMMAPLASTY Bilateral 2018   implants redone w/ placement of implants under muscle  . breast lift bilateral, implants  57/84/6962   bilateral, silicon naturel  . BREAST REDUCTION SURGERY Bilateral 1997  . CESAREAN SECTION  07/27/1981   Placenta Previa  . CHOLECYSTECTOMY  03/2004   Lap surgery  . COSMETIC SURGERY     Breast implants w/ lift, tummy tuck, upper & lower Blepharop  . EYE SURGERY  2017   Cataract surgery & lasik  . GASTRIC BYPASS  2005   Laparoscopic  . IVC FILTER INSERTION  12/2003   TrapEase Vena Cava Filter  . LUMBAR LAMINECTOMY  06/2006   L4-L5 (spinal fusion)  . mini tummy tuck  05/07/2016   Bilateral bra/back roll lift skin removal  . neck surgery C3-C7  02/10/2015   ACDF  . REDUCTION MAMMAPLASTY Bilateral 1997  . SHOULDER  ACROMIOPLASTY Left 03/27/2013   w/ labral debridement  . SMALL INTESTINE SURGERY  12/24/2003   Lap Gastric Bypass  . SPINAL CORD STIMULATOR IMPLANT  11/2010   removed 05/2011  . SPINE SURGERY  2008 2017   Fusion L4-L5, ACDF C4-C7    Prior to Admission medications   Medication Sig Start Date End Date Taking? Authorizing Provider  albuterol (VENTOLIN HFA) 108 (90 Base) MCG/ACT inhaler Inhale 2 puffs into the lungs every 4 (four) hours as needed for wheezing or shortness of breath (cough). 01/29/20  Yes Karamalegos, Devonne Doughty, DO  ARIPiprazole (ABILIFY) 10 MG tablet Take 0.5 tablets (5 mg total) by mouth daily. 04/25/20  Yes Ursula Alert, MD  ASPIRIN LOW DOSE 81 MG EC tablet TAKE 1 TABLET BY MOUTH EVERY DAY 06/04/20  Yes Karamalegos, Devonne Doughty, DO  butalbital-acetaminophen-caffeine (FIORICET) 50-325-40 MG tablet Take 1 tablet by mouth 2 (two) times daily as needed for headache.   Yes [provider]  cyanocobalamin (,VITAMIN B-12,) 1000 MCG/ML injection Inject 1,000 mcg into the muscle every 30 (thirty) days. 08/20/19  Yes [provider]  estradiol (ESTRACE) 1 MG tablet TAKE ONE TABLET BY MOUTH DAILY 12/24/19  Yes Karamalegos, Devonne Doughty, DO  etodolac (LODINE) 500 MG tablet TAKE 1 TABLET BY MOUTH TWICE A DAY 07/15/19  Yes Parks Ranger, Devonne Doughty, DO  ezetimibe (ZETIA) 10 MG tablet TAKE ONE TABLET BY MOUTH ONE TIME DAILY 03/25/20  Yes Karamalegos, Devonne Doughty, DO  fluticasone (FLONASE) 50 MCG/ACT nasal spray SPRAY 2 SPRAYS INTO EACH NOSTRIL EVERY DAY 02/18/20  Yes Karamalegos, Devonne Doughty, DO  furosemide (LASIX) 20 MG tablet TAKE 1 TABLET (20 MG TOTAL) BY MOUTH DAILY AS NEEDED FOR FLUID OR EDEMA. 05/05/20  Yes Gollan, Kathlene November, MD  gabapentin (NEURONTIN) 600 MG tablet TAKE ONE TABLET BY MOUTH THREE TIMES DAILY 05/19/20  Yes Karamalegos, Devonne Doughty, DO  hydrOXYzine (ATARAX/VISTARIL) 25 MG tablet TAKE 0.5-1 TABLETS (12.5-25 MG TOTAL) BY MOUTH 2 (TWO) TIMES DAILY AS NEEDED FOR  ANXIETY. FOR SEVERE PANIC ATTACKS ONLY 01/26/20  Yes Ursula Alert, MD  lamoTRIgine (LAMICTAL) 25 MG tablet Take 1 tablet (25 mg total) by mouth 2 (two) times daily. 04/25/20  Yes Eappen, Ria Clock, MD  omeprazole (PRILOSEC) 40 MG capsule TAKE 1 CAPSULE BY MOUTH EVERY DAY 03/16/20  Yes Karamalegos, Alexander J, DO  OZEMPIC, 1 MG/DOSE, 4 MG/3ML SOPN INJECT 1 MG INTO THE SKIN ONCE A WEEK. 06/09/20  Yes Karamalegos, Devonne Doughty, DO  phentermine (ADIPEX-P) 37.5 MG tablet Take 37.5 mg by mouth at bedtime. 11/18/19  Yes [provider]  propranolol (INDERAL) 10 MG tablet TAKE ONE TABLET BY MOUTH THREE TIMES DAILY as needed for severe anxiety attacks (limited supply) 05/19/20  Yes Eappen, Ria Clock, MD  rosuvastatin (CRESTOR) 40 MG tablet Take 1 tablet (40 mg total) by mouth daily. 10/20/19  Yes Gollan, Kathlene November, MD  tiZANidine (ZANAFLEX) 4 MG tablet Take 4 mg by mouth 4 (four) times daily.   Yes [provider]  traMADol (ULTRAM) 50 MG tablet Take 50 mg by mouth 4 (four) times daily. 11/13/19  Yes [provider]  traZODone (DESYREL) 100 MG tablet TAKE 1 TABLET BY MOUTH EVERYDAY AT BEDTIME 06/09/20  Yes Eappen, Saramma, MD  venlafaxine XR (EFFEXOR-XR) 75 MG 24 hr capsule TAKE ONE CAPSULE BY MOUTH DAILY with breakfast 03/16/20  Yes Eappen, Ria Clock, MD  XIIDRA 5 % SOLN INSTILL 1 DROP INTO BOTH EYES TWICE A DAY 02/18/19  Yes [provider]  diazepam (VALIUM) 5 MG tablet Valium 5 mg tablet  1-2 tabs pre-procedure  take medicatin 40 min before the injection, do not drive Patient not taking: Reported on 06/13/2020    [provider]  diclofenac Sodium (VOLTAREN) 1 % GEL Apply 2 g topically 4 (four) times daily. 10/07/19   [provider]  mometasone (ELOCON) 0.1 % cream Apply 1 application topically 2 (two) times daily. 05/12/20   [provider]  ondansetron (ZOFRAN ODT) 4 MG disintegrating tablet Take 1 tablet (4 mg total) by mouth every 8 (eight) hours as needed  for nausea or vomiting. 12/16/18   Olin Hauser, DO    Allergies as of 05/31/2020 - Review Complete 05/30/2020  Allergen Reaction Noted  . Flagyl [metronidazole] Anaphylaxis 08/14/2016  . Cymbalta [duloxetine hcl]  05/15/2018  . Tape Rash 04/17/2017    Family History  Problem Relation Age of Onset  . COPD Mother   . Lung cancer Mother   . Diabetes Mother   . Heart disease Mother   . Stroke Mother   . Heart attack Mother   . Arthritis Mother   . Cancer Mother   . Hypertension Mother   . Obesity Mother   . Varicose Veins Mother   . Heart disease Father   . Heart attack Father 67  . Early death Father   .  Depression Sister        x 5 sisters  . COPD Sister   . Hypertension Sister   . Heart disease Brother        x 3 brothers  . Diabetes Brother   . Colon polyps Sister   . Hearing loss Sister   . Heart attack Sister   . Heart attack Brother   . Arthritis Sister   . Varicose Veins Sister   . Arthritis Brother   . Heart disease Brother   . Hypertension Brother   . Cancer Maternal Grandfather   . Stroke Maternal Grandfather   . COPD Sister   . Obesity Sister   . Depression Sister   . Depression Sister   . Heart disease Sister   . Hypertension Sister   . Diabetes Brother   . Heart disease Brother   . Hypertension Brother   . Obesity Brother   . Varicose Veins Brother   . Heart disease Brother   . Hypertension Brother   . Obesity Brother   . Hypertension Sister   . Obesity Sister   . Miscarriages / Stillbirths Maternal Aunt   . Miscarriages / Stillbirths Paternal Aunt   . Breast cancer Neg Hx     Social History   Socioeconomic History  . Marital status: Married    Spouse name: Not on file  . Number of children: 1  . Years of education: [employer]  . Highest education level: Some college, no degree  Occupational History  . Occupation: disability  Tobacco Use  . Smoking status: Former Smoker    Packs/day: 0.25    Years: 10.00    Pack  years: 2.50    Types: Cigarettes    Quit date: 11/29/1995    Years since quitting: 24.5  . Smokeless tobacco: Former Systems developer  . Tobacco comment: Quite 1997, didnt smoke hardly at all when I was smoking.  Vaping Use  . Vaping Use: Never used  Substance and Sexual Activity  . Alcohol use: No  . Drug use: No  . Sexual activity: Yes    Birth control/protection: Condom, Post-menopausal, Surgical    Comment: Total Hysterectomy condoms  Other Topics Concern  . 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 Difficulty of Paying Living Expenses: Not hard at all  Food Insecurity: No Food Insecurity  . Worried About Charity fundraiser in the Last Year: Never true  . Ran Out of Food in the Last Year: Never true  Transportation Needs: No Transportation Needs  . Lack of Transportation (Medical): No  . Lack of Transportation (Non-Medical): No  Physical Activity: Inactive  . Days of Exercise per Week: 0 days  . Minutes of Exercise per Session: 0 min  Stress: Stress Concern Present  . Feeling of Stress : Very much  Social Connections: Not on file  Intimate Partner Violence: Not on file    Review of Systems: See HPI, otherwise negative ROS  Physical Exam: BP 112/76   Pulse 84   Temp (!) 96.3 F (35.7 C) (Temporal)   Resp 17   Ht 5\' 2"  (1.575 m)   Wt 82.1 kg   SpO2 98%   BMI 33.11 kg/m  General:   Alert,  pleasant and cooperative in NAD Head:  Normocephalic and atraumatic. Neck:  Supple;  no masses or thyromegaly. Lungs:  Clear throughout to auscultation, normal respiratory effort.    Heart:  +S1, +S2, Regular rate and rhythm, No edema. Abdomen:  Soft, nontender and nondistended. Normal bowel sounds, without guarding, and without rebound.   Neurologic:  Alert and  oriented x4;  grossly normal  neurologically.  Impression/Plan: Ashley Fields is here for a colonoscopy to be performed for average risk screening and EGD for dysphagia  Risks, benefits, limitations, and alternatives regarding the procedures have been reviewed with the patient.  Questions have been answered.  All parties agreeable.   Virgel Manifold, MD  06/13/2020, 7:33 AM

## 2020-06-13 NOTE — Op Note (Signed)
[facility] Patient Name: Ashley Fields Procedure Date: 06/13/2020 6:57 AM MRN: [id number] Account #: [id number] Date of Birth: [birth year] Admit Type: Outpatient Age: 65 Room: [facility] ENDO ROOM 2 Gender: Female Note Status: Finalized Procedure:             Colonoscopy Indications:           Screening for colorectal malignant neoplasm Providers:             Doratha Mcswain B. Bonna Gains MD, MD Referring MD:          Olin Hauser (Referring MD) Medicines:             Monitored Anesthesia Care Complications:         No immediate complications. Procedure:             Pre-Anesthesia Assessment:                        - ASA Grade Assessment: II - A patient with mild                         systemic disease.                        - Prior to the procedure, a History and Physical was                         performed, and patient medications, allergies and                         sensitivities were reviewed. The patient's tolerance                         of previous anesthesia was reviewed.                        - The risks and benefits of the procedure and the                         sedation options and risks were discussed with the                         patient. All questions were answered and informed                         consent was obtained.                        - Patient identification and proposed procedure were                         verified prior to the procedure by the physician, the                         nurse, the anesthesiologist, the anesthetist and the                         technician. The procedure was verified in the                         procedure room.  After obtaining informed consent, the colonoscope was                         passed under direct vision. Throughout the procedure,                         the patient's blood pressure, pulse, and oxygen                         saturations were  monitored continuously. The                         Colonoscope was introduced through the anus and                         advanced to the the cecum, identified by appendiceal                         orifice and ileocecal valve. The colonoscopy was                         performed with ease. The patient tolerated the                         procedure well. The quality of the bowel preparation                         was fair. Findings:      The perianal and digital rectal examinations were normal.      A 7 mm polyp was found in the sigmoid colon. The polyp was sessile. The       polyp was removed with a cold snare. Resection and retrieval were       complete.      A single diverticulum was found in the cecum.      The exam was otherwise without abnormality.      The rectum, sigmoid colon, descending colon, transverse colon, ascending       colon and cecum appeared normal.      Non-bleeding internal hemorrhoids were found during endoscopy.      Anal papilla(e) were hypertrophied.      No additional abnormalities were found on retroflexion. Impression:            - Preparation of the colon was fair.                        - One 7 mm polyp in the sigmoid colon, removed with a                         cold snare. Resected and retrieved.                        - Diverticulosis in the cecum.                        - The examination was otherwise normal.                        - The rectum, sigmoid colon, descending colon,  transverse colon, ascending colon and cecum are normal.                        - Non-bleeding internal hemorrhoids.                        - Anal papilla(e) were hypertrophied. Recommendation:        - Discharge patient to home (with escort).                        - Advance diet as tolerated.                        - Continue present medications.                        - Await pathology results.                        - Repeat colonoscopy date to  be determined after                         pending pathology results are reviewed.                        - The findings and recommendations were discussed with                         the patient.                        - The findings and recommendations were discussed with                         the patient's family.                        - Return to primary care physician as previously                         scheduled.                        - High fiber diet. Procedure Code(s):     --- Professional ---                        [phone], Colonoscopy, flexible; with removal of                         tumor(s), polyp(s), or other lesion(s) by snare                         technique Diagnosis Code(s):     --- Professional ---                        Z12.11, Encounter for screening for malignant neoplasm                         of colon  K63.5, Polyp of colon CPT copyright 2019 American Medical Association. All rights reserved. The codes documented in this report are preliminary and upon coder review may  be revised to meet current compliance requirements.  Vonda Antigua, MD Margretta Sidle B. Bonna Gains MD, MD 06/13/2020 8:40:32 AM This report has been signed electronically. Number of Addenda: 0 Note Initiated On: 06/13/2020 6:57 AM Scope Withdrawal Time: 0 hours 18 minutes 50 seconds  Total Procedure Duration: 0 hours 25 minutes 8 seconds  Estimated Blood Loss:  Estimated blood loss: none.      [facility]

## 2020-06-13 NOTE — Anesthesia Postprocedure Evaluation (Signed)
Anesthesia Post Note  Patient: Ashley Fields  Procedure(s) Performed: COLONOSCOPY WITH PROPOFOL (N/A ) ESOPHAGOGASTRODUODENOSCOPY (EGD) WITH PROPOFOL (N/A )  Patient location during evaluation: Endoscopy Anesthesia Type: General Level of consciousness: awake and alert Pain management: pain level controlled Vital Signs Assessment: post-procedure vital signs reviewed and stable Respiratory status: spontaneous breathing, nonlabored ventilation, respiratory function stable and patient connected to nasal cannula oxygen Cardiovascular status: blood pressure returned to baseline and stable Postop Assessment: no apparent nausea or vomiting Anesthetic complications: no   No complications documented.   Last Vitals:  Vitals:   06/13/20 0834 06/13/20 0844  BP: (!) 151/94   Pulse: (!) 112   Resp: 16   Temp: (!) 35.7 C   SpO2: 96% 100%    Last Pain:  Vitals:   06/13/20 0844  TempSrc:   PainSc: 0-No pain                 Precious Haws Ules Marsala

## 2020-06-13 NOTE — Transfer of Care (Signed)
Immediate Anesthesia Transfer of Care Note  Patient: Ashley Fields  Procedure(s) Performed: COLONOSCOPY WITH PROPOFOL (N/A ) ESOPHAGOGASTRODUODENOSCOPY (EGD) WITH PROPOFOL (N/A )  Patient Location: Endoscopy Unit  Anesthesia Type:General  Level of Consciousness: awake, alert  and patient cooperative  Airway & Oxygen Therapy: Patient Spontanous Breathing  Post-op Assessment: Report given to RN and Post -op Vital signs reviewed and stable  Post vital signs: Reviewed and stable  Last Vitals:  Vitals Value Taken Time  BP 151/94 06/13/20 0837  Temp 35.7 C 06/13/20 0834  Pulse 62 06/13/20 0835  Resp 14 06/13/20 0837  SpO2 96 % 06/13/20 0835  Vitals shown include unvalidated device data.  Last Pain:  Vitals:   06/13/20 0834  TempSrc: Tympanic  PainSc: 0-No pain         Complications: No complications documented.

## 2020-06-13 NOTE — Anesthesia Procedure Notes (Signed)
Date/Time: 06/13/2020 7:37 AM Performed by: Lily Peer, Miasia Crabtree, CRNA Pre-anesthesia Checklist: Patient identified, Suction available, Emergency Drugs available, Patient being monitored and Timeout performed Oxygen Delivery Method: Nasal cannula Induction Type: IV induction

## 2020-06-14 ENCOUNTER — Encounter: Payer: Self-pay | Admitting: Gastroenterology

## 2020-06-14 LAB — SURGICAL PATHOLOGY

## 2020-06-15 DIAGNOSIS — M47896 Other spondylosis, lumbar region: Secondary | ICD-10-CM | POA: Diagnosis not present

## 2020-06-15 DIAGNOSIS — M545 Low back pain, unspecified: Secondary | ICD-10-CM | POA: Diagnosis not present

## 2020-06-15 DIAGNOSIS — M479 Spondylosis, unspecified: Secondary | ICD-10-CM | POA: Diagnosis not present

## 2020-06-15 DIAGNOSIS — M542 Cervicalgia: Secondary | ICD-10-CM | POA: Diagnosis not present

## 2020-06-15 DIAGNOSIS — M48061 Spinal stenosis, lumbar region without neurogenic claudication: Secondary | ICD-10-CM | POA: Diagnosis not present

## 2020-06-15 DIAGNOSIS — M533 Sacrococcygeal disorders, not elsewhere classified: Secondary | ICD-10-CM | POA: Diagnosis not present

## 2020-06-15 DIAGNOSIS — Z79891 Long term (current) use of opiate analgesic: Secondary | ICD-10-CM | POA: Diagnosis not present

## 2020-06-15 DIAGNOSIS — Z5181 Encounter for therapeutic drug level monitoring: Secondary | ICD-10-CM | POA: Diagnosis not present

## 2020-06-15 DIAGNOSIS — G894 Chronic pain syndrome: Secondary | ICD-10-CM | POA: Diagnosis not present

## 2020-06-15 DIAGNOSIS — M25552 Pain in left hip: Secondary | ICD-10-CM | POA: Diagnosis not present

## 2020-06-15 DIAGNOSIS — Z79899 Other long term (current) drug therapy: Secondary | ICD-10-CM | POA: Diagnosis not present

## 2020-06-16 ENCOUNTER — Telehealth: Payer: Self-pay

## 2020-06-16 NOTE — Telephone Encounter (Signed)
Referral with demographics and insurance card faxed to [phone] [facility] [phone]

## 2020-06-17 ENCOUNTER — Other Ambulatory Visit: Payer: Self-pay | Admitting: Psychiatry

## 2020-06-17 ENCOUNTER — Other Ambulatory Visit: Payer: Self-pay | Admitting: Family Medicine

## 2020-06-17 DIAGNOSIS — F410 Panic disorder [episodic paroxysmal anxiety] without agoraphobia: Secondary | ICD-10-CM

## 2020-06-17 DIAGNOSIS — F4310 Post-traumatic stress disorder, unspecified: Secondary | ICD-10-CM

## 2020-06-17 DIAGNOSIS — M5442 Lumbago with sciatica, left side: Secondary | ICD-10-CM

## 2020-06-17 DIAGNOSIS — F3176 Bipolar disorder, in full remission, most recent episode depressed: Secondary | ICD-10-CM

## 2020-06-17 DIAGNOSIS — G894 Chronic pain syndrome: Secondary | ICD-10-CM

## 2020-06-17 DIAGNOSIS — G8929 Other chronic pain: Secondary | ICD-10-CM

## 2020-06-17 NOTE — Telephone Encounter (Signed)
Requested medications are due for refill today yes  Requested medications are on the active medication list yes  Last refill 05/19/20  Last visit 04/27/20 but not addressing chronic pain, do not see that addressed in visit lately  Future visit scheduled no  Notes to clinic Unsure if was to be a continuous med.

## 2020-06-20 ENCOUNTER — Other Ambulatory Visit: Payer: Self-pay | Admitting: Psychiatry

## 2020-06-20 DIAGNOSIS — F4310 Post-traumatic stress disorder, unspecified: Secondary | ICD-10-CM

## 2020-06-20 DIAGNOSIS — F3176 Bipolar disorder, in full remission, most recent episode depressed: Secondary | ICD-10-CM

## 2020-06-20 DIAGNOSIS — F410 Panic disorder [episodic paroxysmal anxiety] without agoraphobia: Secondary | ICD-10-CM

## 2020-06-22 ENCOUNTER — Other Ambulatory Visit: Payer: Self-pay | Admitting: Psychiatry

## 2020-06-22 DIAGNOSIS — F3177 Bipolar disorder, in partial remission, most recent episode mixed: Secondary | ICD-10-CM

## 2020-06-23 ENCOUNTER — Other Ambulatory Visit: Payer: Self-pay

## 2020-06-23 ENCOUNTER — Other Ambulatory Visit: Payer: Self-pay | Admitting: Psychiatry

## 2020-06-23 DIAGNOSIS — R609 Edema, unspecified: Secondary | ICD-10-CM

## 2020-06-23 DIAGNOSIS — G4701 Insomnia due to medical condition: Secondary | ICD-10-CM

## 2020-06-23 DIAGNOSIS — F3176 Bipolar disorder, in full remission, most recent episode depressed: Secondary | ICD-10-CM

## 2020-06-23 DIAGNOSIS — F4310 Post-traumatic stress disorder, unspecified: Secondary | ICD-10-CM

## 2020-06-23 DIAGNOSIS — E782 Mixed hyperlipidemia: Secondary | ICD-10-CM

## 2020-06-23 DIAGNOSIS — F3177 Bipolar disorder, in partial remission, most recent episode mixed: Secondary | ICD-10-CM

## 2020-06-23 DIAGNOSIS — F410 Panic disorder [episodic paroxysmal anxiety] without agoraphobia: Secondary | ICD-10-CM

## 2020-06-23 MED ORDER — FUROSEMIDE 20 MG PO TABS: 20.0000 mg | ORAL_TABLET | Freq: Every day | ORAL | 0 refills | Status: DC | PRN

## 2020-06-23 MED ORDER — EZETIMIBE 10 MG PO TABS: 10.0000 mg | ORAL_TABLET | Freq: Every day | ORAL | 0 refills | Status: DC

## 2020-06-23 MED ORDER — ROSUVASTATIN CALCIUM 40 MG PO TABS: 40.0000 mg | ORAL_TABLET | Freq: Every day | ORAL | 0 refills | Status: DC

## 2020-06-23 NOTE — Telephone Encounter (Signed)
received fax from [facility] that pt needs refills on hydroxyzine, and aripiprazole and trazodone

## 2020-06-23 NOTE — Telephone Encounter (Signed)
Received fax requesting a refill for propranolol 10mg  ,   venlafaxine 75mg ,   trazodone 100mg  [facility]

## 2020-06-23 NOTE — Telephone Encounter (Signed)
Patient had all her medications sent to local pharmacy.  I will have Janett Billow CMA contact patient to verify if these prescription needs to be sent to [facility] or not.

## 2020-06-24 MED ORDER — TRAZODONE HCL 100 MG PO TABS: ORAL_TABLET | ORAL | 1 refills | Status: DC

## 2020-06-24 MED ORDER — VENLAFAXINE HCL ER 75 MG PO CP24: ORAL_CAPSULE | ORAL | 1 refills | Status: DC

## 2020-06-24 NOTE — Telephone Encounter (Signed)
pt called states that she is [facility]

## 2020-06-24 NOTE — Telephone Encounter (Signed)
Contacted patient.  We will send trazodone and venlafaxine to [facility].  She just picked up the other medications from the local pharmacy.

## 2020-06-27 ENCOUNTER — Other Ambulatory Visit: Payer: Self-pay | Admitting: *Deleted

## 2020-06-27 DIAGNOSIS — R609 Edema, unspecified: Secondary | ICD-10-CM

## 2020-06-27 DIAGNOSIS — E782 Mixed hyperlipidemia: Secondary | ICD-10-CM

## 2020-06-27 MED ORDER — FUROSEMIDE 20 MG PO TABS: 20.0000 mg | ORAL_TABLET | Freq: Every day | ORAL | 0 refills | Status: DC | PRN

## 2020-06-27 MED ORDER — ROSUVASTATIN CALCIUM 40 MG PO TABS: 40.0000 mg | ORAL_TABLET | Freq: Every day | ORAL | 0 refills | Status: DC

## 2020-06-27 MED ORDER — EZETIMIBE 10 MG PO TABS: 10.0000 mg | ORAL_TABLET | Freq: Every day | ORAL | 0 refills | Status: DC

## 2020-06-30 ENCOUNTER — Other Ambulatory Visit: Payer: Self-pay | Admitting: Psychiatry

## 2020-06-30 DIAGNOSIS — F4310 Post-traumatic stress disorder, unspecified: Secondary | ICD-10-CM

## 2020-06-30 DIAGNOSIS — F410 Panic disorder [episodic paroxysmal anxiety] without agoraphobia: Secondary | ICD-10-CM

## 2020-07-01 ENCOUNTER — Other Ambulatory Visit: Payer: Self-pay

## 2020-07-01 DIAGNOSIS — G894 Chronic pain syndrome: Secondary | ICD-10-CM

## 2020-07-01 DIAGNOSIS — G8929 Other chronic pain: Secondary | ICD-10-CM

## 2020-07-01 MED ORDER — GABAPENTIN 600 MG PO TABS: 600.0000 mg | ORAL_TABLET | Freq: Three times a day (TID) | ORAL | 3 refills | Status: DC

## 2020-07-04 DIAGNOSIS — Z79899 Other long term (current) drug therapy: Secondary | ICD-10-CM | POA: Diagnosis not present

## 2020-07-04 DIAGNOSIS — Z7982 Long term (current) use of aspirin: Secondary | ICD-10-CM | POA: Diagnosis not present

## 2020-07-04 DIAGNOSIS — Z87891 Personal history of nicotine dependence: Secondary | ICD-10-CM | POA: Diagnosis not present

## 2020-07-04 DIAGNOSIS — Z881 Allergy status to other antibiotic agents status: Secondary | ICD-10-CM | POA: Diagnosis not present

## 2020-07-04 DIAGNOSIS — R1314 Dysphagia, pharyngoesophageal phase: Secondary | ICD-10-CM | POA: Diagnosis not present

## 2020-07-04 DIAGNOSIS — Z79890 Hormone replacement therapy (postmenopausal): Secondary | ICD-10-CM | POA: Diagnosis not present

## 2020-07-05 ENCOUNTER — Encounter: Payer: Self-pay | Admitting: Gastroenterology

## 2020-07-07 DIAGNOSIS — E669 Obesity, unspecified: Secondary | ICD-10-CM | POA: Diagnosis not present

## 2020-07-11 ENCOUNTER — Encounter: Payer: Self-pay | Admitting: Internal Medicine

## 2020-07-12 ENCOUNTER — Other Ambulatory Visit: Payer: Self-pay | Admitting: Family Medicine

## 2020-07-12 DIAGNOSIS — Z1231 Encounter for screening mammogram for malignant neoplasm of breast: Secondary | ICD-10-CM

## 2020-07-15 ENCOUNTER — Other Ambulatory Visit: Payer: Self-pay | Admitting: Psychiatry

## 2020-07-15 DIAGNOSIS — F3177 Bipolar disorder, in partial remission, most recent episode mixed: Secondary | ICD-10-CM

## 2020-07-18 ENCOUNTER — Other Ambulatory Visit: Payer: Self-pay

## 2020-07-18 ENCOUNTER — Encounter: Payer: Self-pay | Admitting: Psychiatry

## 2020-07-18 ENCOUNTER — Telehealth (INDEPENDENT_AMBULATORY_CARE_PROVIDER_SITE_OTHER): Payer: 59 | Admitting: Psychiatry

## 2020-07-18 DIAGNOSIS — G4701 Insomnia due to medical condition: Secondary | ICD-10-CM | POA: Diagnosis not present

## 2020-07-18 DIAGNOSIS — F410 Panic disorder [episodic paroxysmal anxiety] without agoraphobia: Secondary | ICD-10-CM | POA: Diagnosis not present

## 2020-07-18 DIAGNOSIS — F3161 Bipolar disorder, current episode mixed, mild: Secondary | ICD-10-CM | POA: Diagnosis not present

## 2020-07-18 DIAGNOSIS — F4310 Post-traumatic stress disorder, unspecified: Secondary | ICD-10-CM

## 2020-07-18 MED ORDER — LAMOTRIGINE 25 MG PO TABS: 25.0000 mg | ORAL_TABLET | ORAL | 1 refills | Status: DC

## 2020-07-18 MED ORDER — ESZOPICLONE 1 MG PO TABS: 1.0000 mg | ORAL_TABLET | Freq: Every evening | ORAL | 1 refills | Status: DC | PRN

## 2020-07-18 NOTE — Progress Notes (Signed)
Virtual Visit via Video Note  I connected with Ashley Fields on 07/18/20 at  9:20 AM EDT by a video enabled telemedicine application and verified that I am speaking with the correct person using two identifiers.  Location Provider Location : [facility] Patient Location : Home  Participants: Patient , Provider   I discussed the limitations of evaluation and management by telemedicine and the availability of in person appointments. The patient expressed understanding and agreed to proceed.   I discussed the assessment and treatment plan with the patient. The patient was provided an opportunity to ask questions and all were answered. The patient agreed with the plan and demonstrated an understanding of the instructions.   The patient was advised to call back or seek an in-person evaluation if the symptoms worsen or if the condition fails to improve as anticipated.  [place] MD OP Progress Note  07/18/2020 3:47 PM Ashley Fields  MRN:  [id number]  Chief Complaint:  Chief Complaint   Follow-up; Anxiety; Depression    HPI: Ashley Fields is a 65 year old Caucasian female, married, on disability, lives in [place], has a history of bipolar disorder, PTSD, chronic pain, migraine headaches, insomnia was evaluated by telemedicine today.  Patient reports she is currently going through psychosocial stressors of relationship struggles with her sister.  She reports her sister left without saying her goodbyes.  She was staying with her since September 2021.  Patient reports since her sister left she has been struggling with sleep problems, feels depressed, anxious, irritable and agitated often.  She feels sluggish during the day and is unable to enjoy anything.  She reports she has reduced appetite.  Patient is compliant on her medications.  In spite of taking the trazodone or melatonin she is not sleeping well.  She is interested in trying a new sleep medication.  She reports her husband has been very supportive.  She  is not interested in psychotherapy Fields.  She reports she has her animals that she takes care of and could try to distract herself by spending time with them.  Patient denies any suicidality, homicidality or perceptual disturbances.  Patient denies any other concerns today.  Visit Diagnosis:    ICD-10-CM   1. Bipolar 1 disorder, mixed, mild (HCC)  F31.61     2. PTSD (post-traumatic stress disorder)  F43.10 lamoTRIgine (LAMICTAL) 25 MG tablet    3. Panic disorder  F41.0     4. Insomnia due to medical condition  G47.01 eszopiclone (LUNESTA) 1 MG TABS tablet   pain, mood      Past Psychiatric History: Reviewed past psychiatric history from progress note on 05/15/2018.  Past trials of Prozac, Wellbutrin, Lexapro, Cymbalta, Rexulti, Ambien-nightmares  Past Medical History:  Past Medical History:  Diagnosis Date   Allergy    Anemia    Anxiety    Arthritis    Yes   Blood transfusion without reported diagnosis 1977   Transfusions from miscarriage & hemorrhaging   Chronic kidney disease    Depression    Disp fx of cuboid bone of right foot, init for clos fx 01/01/2017   GERD (gastroesophageal reflux disease)    Headache    Hepatitis C    Hyperlipidemia    Neuromuscular disorder ([place]) 1997   Falling to much was an eye-opener   Osteoporosis    Thyroid disease    Urine incontinence     Past Surgical History:  Procedure Laterality Date   5 miscarriages     1977-1985, Blood transfusion  s/p miscarriage [place]   Total   APPENDECTOMY  12/2008   AUGMENTATION MAMMAPLASTY Bilateral 2015   Bilat   AUGMENTATION MAMMAPLASTY Bilateral 2018   implants redone w/ placement of implants under muscle   breast lift bilateral, implants  62/37/6283   bilateral, silicon naturel   BREAST REDUCTION SURGERY Bilateral 1997   CESAREAN SECTION  07/27/1981   Placenta Previa   CHOLECYSTECTOMY  03/2004   Lap surgery   COLONOSCOPY WITH PROPOFOL N/A 06/13/2020    Procedure: COLONOSCOPY WITH PROPOFOL;  Surgeon: Virgel Manifold, MD;  Location: [facility] ENDOSCOPY;  Service: Endoscopy;  Laterality: N/A;   COSMETIC SURGERY     Breast implants w/ lift, tummy tuck, upper & lower Blepharop   ESOPHAGOGASTRODUODENOSCOPY (EGD) WITH PROPOFOL N/A 06/13/2020   Procedure: ESOPHAGOGASTRODUODENOSCOPY (EGD) WITH PROPOFOL;  Surgeon: Virgel Manifold, MD;  Location: [facility] ENDOSCOPY;  Service: Endoscopy;  Laterality: N/A;   EYE SURGERY  2017   Cataract surgery & lasik   GASTRIC BYPASS  2005   Laparoscopic   IVC FILTER INSERTION  12/2003   TrapEase Vena Cava Filter   LUMBAR LAMINECTOMY  06/2006   L4-L5 (spinal fusion)   mini tummy tuck  05/07/2016   Bilateral bra/back roll lift skin removal   neck surgery C3-C7  02/10/2015   ACDF   REDUCTION MAMMAPLASTY Bilateral 1997   SHOULDER ACROMIOPLASTY Left 03/27/2013   w/ labral debridement   SMALL INTESTINE SURGERY  12/24/2003   Lap Gastric Bypass   SPINAL CORD STIMULATOR IMPLANT  11/2010   removed 05/2011   SPINE SURGERY  2008 2017   Fusion L4-L5, ACDF C4-C7    Family Psychiatric History: Reviewed family psychiatric history from progress note on 05/15/2018  Family History:  Family History  Problem Relation Age of Onset   COPD Mother    Lung cancer Mother    Diabetes Mother    Heart disease Mother    Stroke Mother    Heart attack Mother    Arthritis Mother    Cancer Mother    Hypertension Mother    Obesity Mother    Varicose Veins Mother    Heart disease Father    Heart attack Father 26   Early death Father    Depression Sister        x 61 sisters   COPD Sister    Hypertension Sister    Heart disease Brother        x 3 brothers   Diabetes Brother    Colon polyps Sister    Hearing loss Sister    Heart attack Sister    Heart attack Brother    Arthritis Sister    Varicose Veins Sister    Arthritis Brother    Heart disease Brother    Hypertension Brother    Cancer Maternal Grandfather    Stroke  Maternal Grandfather    COPD Sister    Obesity Sister    Depression Sister    Depression Sister    Heart disease Sister    Hypertension Sister    Diabetes Brother    Heart disease Brother    Hypertension Brother    Obesity Brother    Varicose Veins Brother    Heart disease Brother    Hypertension Brother    Obesity Brother    Hypertension Sister    Obesity Sister    Miscarriages / Stillbirths Maternal Aunt    Miscarriages / Stillbirths Paternal Aunt    Breast cancer  Neg Hx     Social History: I have reviewed social history from progress note on 05/15/2018 Social History   Socioeconomic History   Marital status: Married    Spouse name: Not on file   Number of children: 1   Years of education: High School   Highest education level: Some college, no degree  Occupational History   Occupation: disability  Tobacco Use   Smoking status: Former    Packs/day: 0.25    Years: 10.00    Pack years: 2.50    Types: Cigarettes    Quit date: 11/29/1995    Years since quitting: 24.6   Smokeless tobacco: Former   Tobacco comments:    Quite 1997, didnt smoke hardly at all when I was smoking.  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]   Social Determinants of Radio broadcast assistant Strain: Low Risk    Difficulty of Paying Living Expenses: Not hard at all  Food Insecurity: No Food Insecurity   Worried About Charity fundraiser in the Last Year: Never true   Arboriculturist in the Last Year: Never true  Transportation Needs: No Transportation Needs   Lack of Transportation (Medical): No   Lack of Transportation (Non-Medical):  No  Physical Activity: Inactive   Days of Exercise per Week: 0 days   Minutes of Exercise per Session: 0 min  Stress: Stress Concern Present   Feeling of Stress : Very much  Social Connections: Not on file    Allergies:  Allergies  Allergen Reactions   Flagyl [Metronidazole] Anaphylaxis   Cymbalta [Duloxetine Hcl]    Tape Rash    Metabolic Disorder Labs: Lab Results  Component Value Date   HGBA1C 5.0 03/30/2020   MPG 97 03/30/2020   MPG 97 03/17/2019   No results found for: PROLACTIN Lab Results  Component Value Date   CHOL 152 03/30/2020   TRIG 67 03/30/2020   HDL 89 03/30/2020   CHOLHDL 1.7 03/30/2020   LDLCALC 49 03/30/2020   LDLCALC 42 09/22/2019   Lab Results  Component Value Date   TSH 1.97 03/30/2020   TSH 0.86 08/11/2018    Therapeutic Level Labs: No results found for: LITHIUM No results found for: VALPROATE No components found for:  CBMZ  Current Medications: Current Outpatient Medications  Medication Sig Dispense Refill   albuterol (VENTOLIN HFA) 108 (90 Base) MCG/ACT inhaler Inhale 2 puffs into the lungs every 4 (four) hours as needed for wheezing or shortness of breath (cough). 8 g 2   ARIPiprazole (ABILIFY) 5 MG tablet TAKE ONE TABLET BY MOUTH DAILY 90 tablet 0   ASPIRIN LOW DOSE 81 MG EC tablet TAKE 1 TABLET BY MOUTH EVERY DAY 90 tablet 3   butalbital-acetaminophen-caffeine (FIORICET) 50-325-40 MG tablet Take 1 tablet by mouth 2 (two) times daily as needed for headache.     cyanocobalamin (,VITAMIN B-12,) 1000 MCG/ML injection Inject 1,000 mcg into the muscle every 30 (thirty) days.     cyanocobalamin (,VITAMIN B-12,) 1000 MCG/ML injection Inject into the muscle.     diclofenac Sodium (VOLTAREN) 1 % GEL  Apply 2 g topically 4 (four) times daily.     estradiol (ESTRACE) 1 MG tablet TAKE ONE TABLET BY MOUTH DAILY 90 tablet 0   eszopiclone (LUNESTA) 1 MG TABS tablet Take 1 tablet (1 mg total) by mouth at bedtime as needed for sleep. Take immediately  before bedtime 15 tablet 1   etodolac (LODINE) 500 MG tablet TAKE 1 TABLET BY MOUTH TWICE A DAY 180 tablet 1   ezetimibe (ZETIA) 10 MG tablet Take 1 tablet (10 mg total) by mouth daily. 90 tablet 0   fluticasone (FLONASE) 50 MCG/ACT nasal spray SPRAY 2 SPRAYS INTO EACH NOSTRIL EVERY DAY 48 mL 3   furosemide (LASIX) 20 MG tablet Take 1 tablet (20 mg total) by mouth daily as needed for fluid or edema. 90 tablet 0   gabapentin (NEURONTIN) 600 MG tablet Take 1 tablet (600 mg total) by mouth 3 (three) times daily. 90 tablet 3   hydrOXYzine (ATARAX/VISTARIL) 25 MG tablet TAKE 0.5-1 TABLETS (12.5-25 MG TOTAL) BY MOUTH 2 (TWO) TIMES DAILY AS NEEDED FOR ANXIETY. FOR SEVERE PANIC ATTACKS ONLY 180 tablet 1   mometasone (ELOCON) 0.1 % cream Apply 1 application topically 2 (two) times daily.     ondansetron (ZOFRAN ODT) 4 MG disintegrating tablet Take 1 tablet (4 mg total) by mouth every 8 (eight) hours as needed for nausea or vomiting. 30 tablet 2   OZEMPIC, 1 MG/DOSE, 4 MG/3ML SOPN INJECT 1 MG INTO THE SKIN ONCE A WEEK. 3 mL 2   propranolol (INDERAL) 10 MG tablet TAKE ONE TABLET BY MOUTH THREE TIMES DAILY as needed for severe anxiety attacks (limited supply) 30 tablet 0   rosuvastatin (CRESTOR) 40 MG tablet Take 1 tablet (40 mg total) by mouth daily. 90 tablet 0   tiZANidine (ZANAFLEX) 4 MG tablet Take 4 mg by mouth 4 (four) times daily.     traMADol (ULTRAM) 50 MG tablet Take 50 mg by mouth 4 (four) times daily.     venlafaxine XR (EFFEXOR-XR) 75 MG 24 hr capsule TAKE ONE CAPSULE BY MOUTH DAILY with breakfast 90 capsule 1   XIIDRA 5 % SOLN INSTILL 1 DROP INTO BOTH EYES TWICE A DAY     Cholecalciferol 250 MCG (10000 UT) CAPS Take by mouth.     diazepam (VALIUM) 5 MG tablet Valium 5 mg tablet  1-2 tabs pre-procedure  take medicatin 40 min before the injection, do not drive (Patient not taking: No sig reported)     lamoTRIgine (LAMICTAL) 25 MG tablet Take 1 tablet (25 mg total) by mouth as directed. Start  taking 1 tablet daily morning and 2 tablets daily evening 90 tablet 1   omeprazole (PRILOSEC) 40 MG capsule Take by mouth.     phentermine (ADIPEX-P) 37.5 MG tablet Take 37.5 mg by mouth at bedtime. (Patient not taking: Reported on 07/18/2020)     No current facility-administered medications for this visit.     Musculoskeletal: Strength & Muscle Tone:  UTA Gait & Station:  UTA Patient leans: N/A  Psychiatric Specialty Exam: Review of Systems  Psychiatric/Behavioral:  Positive for dysphoric mood and sleep disturbance. The patient is nervous/anxious.   All other systems reviewed and are negative.  There were no vitals taken for this visit.There is no height or weight on file to calculate BMI.  General Appearance: Casual  Eye Contact:  Fair  Speech:  Clear and Coherent  Volume:  Normal  Mood:  Anxious and Depressed  Affect:  Congruent  Thought Process:  Goal  Directed and Descriptions of Associations: Intact  Orientation:  Full (Time, Place, and Person)  Thought Content: Logical   Suicidal Thoughts:  No  Homicidal Thoughts:  No  Memory:  Immediate;   Fair Recent;   Fair Remote;   Fair  Judgement:  Fair  Insight:  Fair  Psychomotor Activity:  Normal  Concentration:  Concentration: Fair and Attention Span: Fair  Recall:  [employer] of Knowledge: Fair  Language: Fair  Akathisia:  No  Handed:  Right  AIMS (if indicated): not done  Assets:  Communication Skills Desire for Improvement Housing Intimacy  ADL's:  Intact  Cognition: WNL  Sleep:  Poor   Screenings: GAD-7    Flowsheet Row Office Visit from 12/28/2019 in [facility] Office Visit from 03/17/2019 in [facility] Office Visit from 11/20/2018 in [facility] Office Visit from 08/18/2018 in [facility] Office Visit from 07/22/2018 in [facility]  Total GAD-7 Score 2 2 4 9 8       PHQ2-9    Flowsheet Row Video Visit from 07/18/2020 in [place] Video Visit from 04/25/2020 in [place] from 03/29/2020 in [facility] Office Visit from 12/28/2019 in [facility] Office Visit from 09/21/2019 in [place]  PHQ-2 Total Score 6 2 4  0 0  PHQ-9 Total Score 21 5 9 1  0      Flowsheet Row Admission (Discharged) from 06/13/2020 in [facility] No Risk        Assessment and Plan: Ashley Fields is a 65 year old Caucasian female who has a history of PTSD, bipolar disorder, panic attacks, migraine headaches, chronic pain was evaluated by telemedicine today.  Patient is currently struggling with mood problems and sleep issues.  Discussed plan as noted below.  Plan Bipolar disorder mixed mild-unstable Abilify 5 mg p.o. daily Increase lamotrigine to 75 mg p.o. daily in divided dosage Gabapentin 600 mg p.o. 3 times daily-prescribed by primary care provider Venlafaxine extended release 75 mg p.o. daily  PTSD-stable Venlafaxine as prescribed Hydroxyzine 25 mg p.o. daily as needed for severe anxiety attacks  Panic disorder-stable Continue venlafaxine.  Insomnia-unstable Discontinue trazodone. Start Lunesta 1 mg p.o. nightly We will consider increasing the dosage as needed. Provided medication education. Melatonin 10 mg p.o. nightly  Follow-up in clinic in 3 to 4 weeks or sooner if needed.  I have spent atleast 30 minutes face to face with patient today which includes the time spent for preparing to see the patient ( e.g., review of test, records ), ordering medications and test ,psychoeducation and supportive psychotherapy as well as documenting clinical information in electronic health record,interpreting and communication of test results   This note was generated in part or whole with voice recognition software. Voice recognition is usually quite accurate but there  are transcription errors that can and very often do occur. I apologize for any typographical errors that were not detected and corrected.       Ursula Alert, MD 07/18/2020, 3:47 PM

## 2020-07-18 NOTE — Patient Instructions (Signed)
Eszopiclone tablets What is this medication? ESZOPICLONE (es ZOE pi clone) is used to treat insomnia. This medicine helpsyou to fall asleep and sleep through the night. This medicine may be used for other purposes; ask your health care provider orpharmacist if you have questions. COMMON BRAND NAME(S): Lunesta What should I tell my care team before I take this medication? They need to know if you have any of these conditions: depression history of a drug or alcohol abuse problem liver disease lung or breathing disease sleep-walking, driving, eating or other activity while not fully awake after taking a sleep medicine suicidal thoughts an unusual or allergic reaction to eszopiclone, other medicines, foods, dyes, or preservatives pregnant or trying to get pregnant breast-feeding How should I use this medication? Take this medicine by mouth with a glass of water. Follow the directions on the prescription label. It is better to take this medicine on an empty stomach and only when you are ready for bed. Do not take your medicine more often than directed. If you have been taking this medicine for several weeks and suddenly stop taking it, you may get unpleasant withdrawal symptoms. Your doctor or health care professional may want to gradually reduce the dose. Do not stop taking this medicine on your own. Always follow your doctor or health careprofessional's advice. Talk to your pediatrician regarding the use of this medicine in children.Special care may be needed. Overdosage: If you think you have taken too much of this medicine contact apoison control center or emergency room at once. NOTE: This medicine is only for you. Do not share this medicine with others. What if I miss a dose? This does not apply. This medicine should only be taken immediately beforegoing to sleep. Do not take double or extra doses. What may interact with this medication? herbal medicines like kava kava, melatonin, St.  John's wort and valerian lorazepam medicines for fungal infections like ketoconazole, fluconazole, or itraconazole olanzapine This list may not describe all possible interactions. Give your health care provider a list of all the medicines, herbs, non-prescription drugs, or dietary supplements you use. Also tell them if you smoke, drink alcohol, or use illegaldrugs. Some items may interact with your medicine. What should I watch for while using this medication? Visit your doctor or health care professional for regular checks on your progress. Keep a regular sleep schedule by going to bed at about the same time nightly. Avoid caffeine-containing drinks in the evening hours, as caffeine can cause trouble with falling asleep. Talk to your doctor if you still havetrouble sleeping. After taking this medicine, you may get up out of bed and do an activity that you do not know you are doing. The next morning, you may have no memory of this. Activities include driving a car ("sleep-driving"), making and eating food, talking on the phone, sexual activity, and sleep-walking. Serious injuries have occurred. Stop the medicine and call your doctor right away if you find out you have done any of these activities. Do not take this medicine if you have used alcohol that evening. Do not take it if you have taken anothermedicine for sleep. The risk of doing these sleep-related activities is higher. Do not take this medicine unless you are able to stay in bed for a full night (7 to 8 hours) before you must be active again. You may have a decrease in mental alertness the day after use, even if you feel that you are fully awake. Tell your doctor if you will  need to perform activities requiring full alertness, such as driving, the next day. Do not stand or sit up quickly after taking this medicine, especially if you are an older patient. This reduces therisk of dizzy or fainting spells. If you or your family notice any changes in  your behavior, such as new or worsening depression, thoughts of harming yourself, anxiety, other unusual ordisturbing thoughts, or memory loss, call your doctor right away. After you stop taking this medicine, you may have trouble falling asleep. This is called rebound insomnia. This problem usually goes away on its own after 1or 2 nights. What side effects may I notice from receiving this medication? Side effects that you should report to your doctor or health care professionalas soon as possible: allergic reactions like skin rash, itching or hives, swelling of the face, lips, or tongue changes in vision confusion depressed mood feeling faint or lightheaded, falls hallucinations problems with balance, speaking, walking restlessness, excitability, or feelings of agitation unusual activities while not fully awake like driving, eating, making phone calls Side effects that usually do not require medical attention (report to yourdoctor or health care professional if they continue or are bothersome): dizziness, or daytime drowsiness, sometimes called a hangover effect headache This list may not describe all possible side effects. Call your doctor for medical advice about side effects. You may report side effects to FDA at1-800-FDA-1088. Where should I keep my medication? Keep out of the reach of children. This medicine can be abused. Keep your medicine in a safe place to protect it from theft. Do not share this medicine with anyone. Selling or giving away this medicine is dangerous and against thelaw. This medicine may cause accidental overdose and death if taken by other adults, children, or pets. Mix any unused medicine with a substance like cat litter or coffee grounds. Then throw the medicine away in a sealed container like a sealed bag or a coffee can with a lid. Do not use the medicine after theexpiration date. Store at room temperature between 15 and 30 degrees C (59 and 86 degrees F). NOTE:  This sheet is a summary. It may not cover all possible information. If you have questions about this medicine, talk to your doctor, pharmacist, orhealth care provider.  2022 Elsevier/Gold Standard (2017-06-21 11:57:05)

## 2020-07-19 ENCOUNTER — Other Ambulatory Visit: Payer: Self-pay | Admitting: Psychiatry

## 2020-07-19 DIAGNOSIS — F4310 Post-traumatic stress disorder, unspecified: Secondary | ICD-10-CM

## 2020-07-19 DIAGNOSIS — F3161 Bipolar disorder, current episode mixed, mild: Secondary | ICD-10-CM

## 2020-07-19 DIAGNOSIS — F410 Panic disorder [episodic paroxysmal anxiety] without agoraphobia: Secondary | ICD-10-CM

## 2020-07-21 ENCOUNTER — Other Ambulatory Visit: Payer: Self-pay | Admitting: Psychiatry

## 2020-07-21 DIAGNOSIS — F4310 Post-traumatic stress disorder, unspecified: Secondary | ICD-10-CM

## 2020-07-21 DIAGNOSIS — F410 Panic disorder [episodic paroxysmal anxiety] without agoraphobia: Secondary | ICD-10-CM

## 2020-07-27 ENCOUNTER — Other Ambulatory Visit: Payer: Self-pay | Admitting: Family Medicine

## 2020-07-27 DIAGNOSIS — E782 Mixed hyperlipidemia: Secondary | ICD-10-CM

## 2020-07-27 NOTE — Telephone Encounter (Signed)
   Notes to clinic:  medication was filled by a different provider  Review for refill    Requested Prescriptions  Pending Prescriptions Disp Refills   ezetimibe (ZETIA) 10 MG tablet [Pharmacy Med Name: Ezetimibe Oral Tablet 10 MG] 90 tablet 0    Sig: TAKE ONE TABLET BY MOUTH ONE TIME DAILY      Cardiovascular:  Antilipid - Sterol Transport Inhibitors Passed - 07/27/2020  9:48 AM      Passed - Total Cholesterol in normal range and within 360 days    Cholesterol  Date Value Ref Range Status  03/30/2020 152 <200 mg/dL Final          Passed - LDL in normal range and within 360 days    LDL Cholesterol (Calc)  Date Value Ref Range Status  03/30/2020 49 mg/dL (calc) Final    Comment:    Reference range: <100 . Desirable range <100 mg/dL for primary prevention;   <70 mg/dL for patients with CHD or diabetic patients  with > or = 2 CHD risk factors. Marland Kitchen LDL-C is now calculated using the Martin-Hopkins  calculation, which is a validated novel method providing  better accuracy than the Friedewald equation in the  estimation of LDL-C.  Cresenciano Genre et al. Annamaria Helling. 0923;300(76): 2061-2068  (http://education.[employer].com/faq/FAQ164)           Passed - HDL in normal range and within 360 days    HDL  Date Value Ref Range Status  03/30/2020 89 > OR = 50 mg/dL Final          Passed - Triglycerides in normal range and within 360 days    Triglycerides  Date Value Ref Range Status  03/30/2020 67 <150 mg/dL Final          Passed - Valid encounter within last 12 months    Recent Outpatient Visits           3 months ago Annual physical exam   [place], DO   6 months ago Acute viral pharyngitis   [place], DO   7 months ago Morbid obesity [facility])   [place], DO   7 months ago Acute pain of left knee   [place], DO   8 months ago Hordeolum internum of right upper eyelid   [place], DO       Future Appointments             In 1 month Bonna Gains, Lennette Bihari, MD [place]   In 8 months  [facility]

## 2020-07-28 ENCOUNTER — Telehealth: Payer: Self-pay

## 2020-07-28 ENCOUNTER — Other Ambulatory Visit: Payer: Self-pay

## 2020-07-28 ENCOUNTER — Telehealth: Payer: Self-pay | Admitting: Cardiovascular Disease

## 2020-07-28 DIAGNOSIS — G4701 Insomnia due to medical condition: Secondary | ICD-10-CM

## 2020-07-28 DIAGNOSIS — G894 Chronic pain syndrome: Secondary | ICD-10-CM

## 2020-07-28 DIAGNOSIS — G8929 Other chronic pain: Secondary | ICD-10-CM

## 2020-07-28 DIAGNOSIS — M5442 Lumbago with sciatica, left side: Secondary | ICD-10-CM

## 2020-07-28 MED ORDER — GABAPENTIN 600 MG PO TABS: 600.0000 mg | ORAL_TABLET | Freq: Three times a day (TID) | ORAL | 3 refills | Status: DC

## 2020-07-28 MED ORDER — TRAZODONE HCL 100 MG PO TABS
100.0000 mg | ORAL_TABLET | Freq: Every day | ORAL | 0 refills | Status: DC
Start: 2020-07-28 — End: 2020-11-21

## 2020-07-28 NOTE — Telephone Encounter (Signed)
Spoke with patient and she reports swelling on her left side. Swelling is to left leg and foot. Then we discussed the chest sensations she was having. She describes it as pressure, left arm pain from elbow up, shortness of breath, reports that it comes and goes. Questioned her about when she has that what she was doing. She states this has been going on for 2-3 weeks and it comes and goes. During these events she is mostly resting, sitting, or laying in bed. Reviewed if she had taken her fluid pill Lasix/Furosemide and she has not taken that. Requested that she please take that to see if it will help with her swelling. Then we discussed in depth signs and symptoms that would require immediate evaluation in the emergency department. She has been scheduled to come in for further assessment but strongly recommended to not wait if she has recurring pain. She verbalized understanding of this information and was agreeable.

## 2020-07-28 NOTE — Telephone Encounter (Signed)
Pt c/o swelling: STAT is pt has developed SOB within 24 hours  If swelling, where is the swelling located?  Feet and legs, mostly in the left leg - getting a strange sensation near left breast  How much weight have you gained and in what time span? Not weighed  Have you gained 3 pounds in a day or 5 pounds in a week?   Do you have a log of your daily weights (if so, list)?   Are you currently taking a fluid pill? No, has them but hasn't been taking them  Are you currently SOB? Yes, when doing activities   Have you traveled recently? no   Patient scheduled first available with R Dunn on 08/12.

## 2020-07-28 NOTE — Telephone Encounter (Signed)
pt called states that she can not take the lunesta and that she went back on the trazodone

## 2020-07-28 NOTE — Telephone Encounter (Signed)
Will discontinue Lunesta since patient is on trazodone.

## 2020-08-01 ENCOUNTER — Encounter: Payer: Self-pay | Admitting: Cardiovascular Disease

## 2020-08-01 ENCOUNTER — Ambulatory Visit (INDEPENDENT_AMBULATORY_CARE_PROVIDER_SITE_OTHER): Payer: 59 | Admitting: Cardiovascular Disease

## 2020-08-01 ENCOUNTER — Other Ambulatory Visit: Payer: Self-pay

## 2020-08-01 VITALS — BP 110/62 | HR 89 | Ht 62.0 in | Wt 197.0 lb

## 2020-08-01 DIAGNOSIS — R072 Precordial pain: Secondary | ICD-10-CM | POA: Diagnosis not present

## 2020-08-01 DIAGNOSIS — I739 Peripheral vascular disease, unspecified: Secondary | ICD-10-CM | POA: Diagnosis not present

## 2020-08-01 DIAGNOSIS — G894 Chronic pain syndrome: Secondary | ICD-10-CM | POA: Diagnosis not present

## 2020-08-01 DIAGNOSIS — I25118 Atherosclerotic heart disease of native coronary artery with other forms of angina pectoris: Secondary | ICD-10-CM

## 2020-08-01 NOTE — Patient Instructions (Addendum)
Medication Instructions:  Lasix as needed for swelling (try to take at least 1-2 times per week)  If you need a refill on your cardiac medications before your next appointment, please call your pharmacy.    Lab work: No new labs needed   If you have labs (blood work) drawn today and your tests are completely normal, you will receive your results only by: [place] (if you have MyChart) OR A paper copy in the mail If you have any lab test that is abnormal or we need to change your treatment, we will call you to review the results.   Testing/Procedures: Your physician has requested that you have a lexiscan myoview.   [place]  Your caregiver has ordered a Stress Test with nuclear imaging. The purpose of this test is to evaluate the blood supply to your heart muscle. This procedure is referred to as a "Non-Invasive Stress Test." This is because other than having an IV started in your vein, nothing is inserted or "invades" your body. Cardiac stress tests are done to find areas of poor blood flow to the heart by determining the extent of coronary artery disease (CAD). Some patients exercise on a treadmill, which naturally increases the blood flow to your heart, while others who are  unable to walk on a treadmill due to physical limitations have a pharmacologic/chemical stress agent called Lexiscan . This medicine will mimic walking on a treadmill by temporarily increasing your coronary blood flow.   Please note: these test may take anywhere between 2-4 hours to complete  PLEASE REPORT TO [facility] AT THE FIRST DESK WILL DIRECT YOU WHERE TO GO  Date of Procedure:_____________________________________  Arrival Time for Procedure:______________________________  Instructions regarding medication:   _x___:  Hold betablocker(s) night before procedure and morning of procedure- PROPRANOLOL  __x__:  Hold other medications as follows: LASIX (furosemide)  the morning of your procedure  __x___: Dennis Bast may take any other regular morning medications the day of your test with enough water to get them down safely  PLEASE NOTIFY THE OFFICE AT LEAST 24 HOURS IN ADVANCE IF YOU ARE UNABLE TO KEEP YOUR APPOINTMENT.  [phone] AND  PLEASE NOTIFY NUCLEAR MEDICINE AT [facility] AT LEAST 24 HOURS IN ADVANCE IF YOU ARE UNABLE TO KEEP YOUR APPOINTMENT. [phone]  How to prepare for your Myoview test:  Do not eat or drink after midnight No caffeine for 24 hours prior to test No smoking 24 hours prior to test. Your medication may be taken with water.  If your doctor stopped a medication because of this test, do not take that medication. Ladies, please do not wear dresses.  Skirts or pants are appropriate. Please wear a short sleeve shirt. No perfume, cologne or lotion. Wear comfortable walking shoes. No heels!   Follow-Up: At [facility], you and your health needs are our priority.  As part of our continuing mission to provide you with exceptional heart care, we have created designated Provider Care Teams.  These Care Teams include your primary Cardiologist (physician) and Advanced Practice Providers (APPs -  Physician Assistants and Nurse Practitioners) who all work together to provide you with the care you need, when you need it.  You will need a follow up appointment in 6 months, APP ok  Providers on your designated Care Team:   Murray Hodgkins, NP Christell Faith, PA-C Marrianne Mood, PA-C Cadence Kathlen Mody, [place]  Any Other Special Instructions Will Be Listed Below (If Applicable).  COVID-19  Vaccine Information can be found at: [email or web address] For questions related to vaccine distribution or appointments, please email [email or web address] or call [phone].

## 2020-08-01 NOTE — Progress Notes (Signed)
Date:  08/01/2020   ID:  Ashley Fields, DOB 28-Feb-1955, MRN [id number]  Patient Location:  [place] [address]   Provider location:   [facility], [place] office  PCP:  Olin Hauser, DO  Cardiologist:  Arvid Right [facility]   Chief Complaint  Patient presents with   Other    Early follow up -- Swelling. Meds reviewed verbally with patient.      History of Present Illness:    Ashley Fields is a 65 y.o. 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,  Vascular disease,  Coronary calcification , calcium score 2400 Who presents for follow-up of her coronary artery disease  LOV 10/2019  Left leg swelling Drinks a lot of beverages during the daytime Not much fast food Gained 6 pounds Not taking her Lasix  [place]  Lab work reviewed A1C 5.0 CMP normal  Atypical left-sided discomfort comes as a short stabbing resolves quickly without intervention sometimes down the left shoulder left neck with tingling  Chronic back pain back surgery  Oct 21st 2019  EKG personally reviewed by myself on todays visit NSR rate 89 no ST and T wave changes  Other past medical history reviewed Stress test 06/13/2017 Pharmacological myocardial perfusion imaging study with no significant  ischemia Normal wall motion, EF estimated at 83% Low risk scan  Remote smoking history for at least 15 years  CT chest  calcium score of 2400  aortic atherosclerosis noted "Coronary arteries: Marked LM and 3 vessel coronary calcification" Ascending Aorta: Calcific aortic atherosclerosis normal diameter 3.4 cm  minimal carotid disease bilaterally  less than 39% bilaterally  Family history loss of her sister recently, due to cardiac arrest father died 66 MI Sister 67 died cardiac arrest   Past Medical  History:  Diagnosis Date   Allergy    Anemia    Anxiety    Arthritis    Yes   Blood transfusion without reported diagnosis 1977   Transfusions from miscarriage & hemorrhaging   Chronic kidney disease    Depression    Disp fx of cuboid bone of right foot, init for clos fx 01/01/2017   GERD (gastroesophageal reflux disease)    Headache    Hepatitis C    Hyperlipidemia    Neuromuscular disorder ([place]) 1997   Falling to much was an eye-opener   Osteoporosis    Thyroid disease    Urine incontinence    Past Surgical History:  Procedure Laterality Date   5 miscarriages     1977-1985, Blood transfusion s/p miscarriage 1977   ABDOMINAL HYSTERECTOMY  1987   Total   APPENDECTOMY  12/2008   AUGMENTATION MAMMAPLASTY Bilateral 2015   Bilat   AUGMENTATION MAMMAPLASTY Bilateral 2018   implants redone w/ placement of implants under muscle   breast lift bilateral, implants  0000000   bilateral, silicon naturel   BREAST REDUCTION SURGERY Bilateral 1997   CESAREAN SECTION  07/27/1981   Placenta Previa   CHOLECYSTECTOMY  03/2004   Lap surgery   COLONOSCOPY WITH PROPOFOL N/A 06/13/2020   Procedure: COLONOSCOPY WITH PROPOFOL;  Surgeon: Virgel Manifold, MD;  Location: [facility] ENDOSCOPY;  Service: Endoscopy;  Laterality: N/A;   COSMETIC SURGERY     Breast implants w/ lift, tummy tuck, upper & lower Blepharop   ESOPHAGOGASTRODUODENOSCOPY (  EGD) WITH PROPOFOL N/A 06/13/2020   Procedure: ESOPHAGOGASTRODUODENOSCOPY (EGD) WITH PROPOFOL;  Surgeon: Virgel Manifold, MD;  Location: [facility] ENDOSCOPY;  Service: Endoscopy;  Laterality: N/A;   EYE SURGERY  2017   Cataract surgery & lasik   GASTRIC BYPASS  2005   Laparoscopic   IVC FILTER INSERTION  12/2003   TrapEase Vena Cava Filter   LUMBAR LAMINECTOMY  06/2006   L4-L5 (spinal fusion)   mini tummy tuck  05/07/2016   Bilateral bra/back roll lift skin removal   neck surgery C3-C7  02/10/2015   ACDF   REDUCTION MAMMAPLASTY Bilateral 1997    SHOULDER ACROMIOPLASTY Left 03/27/2013   w/ labral debridement   SMALL INTESTINE SURGERY  12/24/2003   Lap Gastric Bypass   SPINAL CORD STIMULATOR IMPLANT  11/2010   removed 05/2011   SPINE SURGERY  2008 2017   Fusion L4-L5, ACDF C4-C7      Allergies:   Flagyl [metronidazole], Cymbalta [duloxetine hcl], and Tape   Social History   Tobacco Use   Smoking status: Former    Packs/day: 0.25    Years: 10.00    Pack years: 2.50    Types: Cigarettes    Quit date: 11/29/1995    Years since quitting: 24.6   Smokeless tobacco: Former   Tobacco comments:    Quite 1997, didnt smoke hardly at all when I was smoking.  Vaping Use   Vaping Use: Never used  Substance Use Topics   Alcohol use: No   Drug use: No     Current Outpatient Medications on File Prior to Visit  Medication Sig Dispense Refill   ARIPiprazole (ABILIFY) 5 MG tablet TAKE ONE TABLET BY MOUTH DAILY 90 tablet 0   ASPIRIN LOW DOSE 81 MG EC tablet TAKE 1 TABLET BY MOUTH EVERY DAY 90 tablet 3   butalbital-acetaminophen-caffeine (FIORICET) 50-325-40 MG tablet Take 1 tablet by mouth 2 (two) times daily as needed for headache.     Cholecalciferol 250 MCG (10000 UT) CAPS Take by mouth.     cyanocobalamin (,VITAMIN B-12,) 1000 MCG/ML injection Inject 1,000 mcg into the muscle every 30 (thirty) days.     diclofenac Sodium (VOLTAREN) 1 % GEL Apply 2 g topically 4 (four) times daily.     estradiol (ESTRACE) 1 MG tablet TAKE ONE TABLET BY MOUTH DAILY 90 tablet 0   etodolac (LODINE) 500 MG tablet TAKE 1 TABLET BY MOUTH TWICE A DAY 180 tablet 1   ezetimibe (ZETIA) 10 MG tablet TAKE ONE TABLET BY MOUTH ONE TIME DAILY 90 tablet 1   fluticasone (FLONASE) 50 MCG/ACT nasal spray SPRAY 2 SPRAYS INTO EACH NOSTRIL EVERY DAY 48 mL 3   furosemide (LASIX) 20 MG tablet Take 1 tablet (20 mg total) by mouth daily as needed for fluid or edema. 90 tablet 0   gabapentin (NEURONTIN) 600 MG tablet Take 1 tablet (600 mg total) by mouth 3 (three) times  daily. 90 tablet 3   hydrOXYzine (ATARAX/VISTARIL) 25 MG tablet TAKE 1/2 TO 1 TABLET BY MOUTH TWICE A DAY AS NEEDED FOR SEVERE PANIC ATTACKS/ ANXIETY 180 tablet 1   lamoTRIgine (LAMICTAL) 25 MG tablet Take 1 tablet (25 mg total) by mouth as directed. Start taking 1 tablet daily morning and 2 tablets daily evening 90 tablet 1   mometasone (ELOCON) 0.1 % cream Apply 1 application topically 2 (two) times daily.     omeprazole (PRILOSEC) 40 MG capsule Take by mouth.     ondansetron (ZOFRAN ODT) 4 MG disintegrating  tablet Take 1 tablet (4 mg total) by mouth every 8 (eight) hours as needed for nausea or vomiting. 30 tablet 2   OZEMPIC, 1 MG/DOSE, 4 MG/3ML SOPN INJECT 1 MG INTO THE SKIN ONCE A WEEK. 3 mL 2   propranolol (INDERAL) 10 MG tablet TAKE ONE TABLET BY MOUTH THREE TIMES DAILY as needed for severe anxiety attacks (limited supply) 30 tablet 0   rosuvastatin (CRESTOR) 40 MG tablet Take 1 tablet (40 mg total) by mouth daily. 90 tablet 0   tiZANidine (ZANAFLEX) 4 MG tablet Take 4 mg by mouth 4 (four) times daily.     traMADol (ULTRAM) 50 MG tablet Take 50 mg by mouth 4 (four) times daily.     traZODone (DESYREL) 100 MG tablet Take 1 tablet (100 mg total) by mouth at bedtime. 90 tablet 0   venlafaxine XR (EFFEXOR-XR) 75 MG 24 hr capsule TAKE ONE CAPSULE BY MOUTH DAILY with breakfast 90 capsule 1   XIIDRA 5 % SOLN INSTILL 1 DROP INTO BOTH EYES TWICE A DAY     albuterol (VENTOLIN HFA) 108 (90 Base) MCG/ACT inhaler Inhale 2 puffs into the lungs every 4 (four) hours as needed for wheezing or shortness of breath (cough). (Patient not taking: Reported on 08/01/2020) 8 g 2   diazepam (VALIUM) 5 MG tablet Valium 5 mg tablet  1-2 tabs pre-procedure  take medicatin 40 min before the injection, do not drive (Patient not taking: No sig reported)     phentermine (ADIPEX-P) 37.5 MG tablet Take 37.5 mg by mouth at bedtime. (Patient not taking: Reported on 07/18/2020)     No current facility-administered medications  on file prior to visit.     Family Hx: The patient's family history includes Arthritis in her brother, mother, and sister; COPD in her mother, sister, and sister; Cancer in her maternal grandfather and mother; Colon polyps in her sister; Depression in her sister, sister, and sister; Diabetes in her brother, brother, and mother; Early death in her father; Hearing loss in her sister; Heart attack in her brother, mother, and sister; Heart attack (age of onset: 12) in her father; Heart disease in her brother, brother, brother, brother, father, mother, and sister; Hypertension in her brother, brother, brother, mother, sister, sister, and sister; Lung cancer in her mother; Miscarriages / Stillbirths in her maternal aunt and paternal aunt; Obesity in her brother, brother, mother, sister, and sister; Stroke in her maternal grandfather and mother; Varicose Veins in her brother, mother, and sister. There is no history of Breast cancer.  ROS:   Please see the history of present illness.    Review of Systems  Constitutional: Negative.   HENT: Negative.    Respiratory: Negative.    Cardiovascular: Negative.   Gastrointestinal: Negative.   Musculoskeletal: Negative.   Neurological: Negative.   Psychiatric/Behavioral: Negative.    All other systems reviewed and are negative.   Labs/Other Tests and Data Reviewed:    Recent Labs: 03/30/2020: ALT 23; BUN 15; Creat 0.96; Hemoglobin 14.4; Platelets 167; Potassium 4.6; Sodium 142; TSH 1.97   Recent Lipid Panel Lab Results  Component Value Date/Time   CHOL 152 03/30/2020 08:02 AM   TRIG 67 03/30/2020 08:02 AM   HDL 89 03/30/2020 08:02 AM   CHOLHDL 1.7 03/30/2020 08:02 AM   LDLCALC 49 03/30/2020 08:02 AM    Wt Readings from Last 3 Encounters:  08/01/20 197 lb (89.4 kg)  06/13/20 181 lb (82.1 kg)  05/30/20 188 lb (85.3 kg)  Exam:    Vital Signs: Vital signs may also be detailed in the HPI BP 110/62 (BP Location: Left Arm, Patient Position:  Sitting, Cuff Size: Large)   Pulse 89   Ht '5\' 2"'$  (1.575 m)   Wt 197 lb (89.4 kg)   SpO2 93%   BMI 36.03 kg/m  Constitutional:  oriented to person, place, and time. No distress.  HENT:  Head: Grossly normal Eyes:  no discharge. No scleral icterus.  Neck: No JVD, no carotid bruits  Cardiovascular: Regular rate and rhythm, no murmurs appreciated Pulmonary/Chest: Clear to auscultation bilaterally, no wheezes or rails Abdominal: Soft.  no distension.  no tenderness.  Musculoskeletal: Normal range of motion Neurological:  normal muscle tone. Coordination normal. No atrophy Skin: Skin warm and dry Psychiatric: normal affect, pleasant  ASSESSMENT & PLAN:    Problem List Items Addressed This Visit       Cardiology Problems   Atherosclerosis of native coronary artery of native heart with stable angina pectoris ([place]) - Primary   Relevant Orders   EKG 12-Lead   PAD (peripheral artery disease) (HCC)   Relevant Orders   EKG 12-Lead     Other   Chronic pain syndrome (Chronic)   Other Visit Diagnoses     Precordial pain       Relevant Orders   NM Myocar Multi W/Spect W/Wall Motion / EF     Hollenhorst plaque, right eye -  Carotid with minimal bilateral disease significant aortic atherosclerosis  Cholesterol at goal   CAD with chronic stable angina Calcium score 2400 on  CT scan Negative stress test 2019 Continue Crestor 40 with Zetia 10 Atypical left-sided symptoms though high risk given heavy coronary calcification, repeat stress test ordered  Mixed hyperlipidemia Cholesterol is at goal on the current lipid regimen. No changes to the medications were made.  Smoker  Reports that she quit over 20 years ago, 1997 she quit    Total encounter time more than 25 minutes  Greater than 50% was spent in counseling and coordination of care with the patient   Signed, Ida Rogue, [id number],  [place], [place] [address]

## 2020-08-01 NOTE — Telephone Encounter (Signed)
Spoke with patient and reviewed provider recommendations to schedule sooner appointment. Offered DOD slot today with Dr. Rockey Situ. She was agreeable with this plan and had no further questions at this time. Scheduled her time, confirmed that she should arrive a little early for registration. She verbalized understanding of plan with no further questions at this time.      Minna Merritts, MD  20 hours ago (11:19 AM)   Any opening would add her on,  any provder,  Has severe CAD, may need heart cath if sx concerning for angina  TG

## 2020-08-02 ENCOUNTER — Encounter: Payer: Self-pay | Admitting: Psychiatry

## 2020-08-02 ENCOUNTER — Telehealth (INDEPENDENT_AMBULATORY_CARE_PROVIDER_SITE_OTHER): Payer: 59 | Admitting: Psychiatry

## 2020-08-02 DIAGNOSIS — G4701 Insomnia due to medical condition: Secondary | ICD-10-CM | POA: Diagnosis not present

## 2020-08-02 DIAGNOSIS — F4310 Post-traumatic stress disorder, unspecified: Secondary | ICD-10-CM

## 2020-08-02 DIAGNOSIS — F410 Panic disorder [episodic paroxysmal anxiety] without agoraphobia: Secondary | ICD-10-CM | POA: Diagnosis not present

## 2020-08-02 DIAGNOSIS — F3161 Bipolar disorder, current episode mixed, mild: Secondary | ICD-10-CM | POA: Diagnosis not present

## 2020-08-02 NOTE — Progress Notes (Signed)
Virtual Visit via Video Note  I connected with Ashley Fields on 08/02/20 at  4:45 PM EDT by a video enabled telemedicine application and verified that I am speaking with the correct person using two identifiers.  Location Provider Location : [facility] Patient Location : Home  Participants: Patient , Provider   I discussed the limitations of evaluation and management by telemedicine and the availability of in person appointments. The patient expressed understanding and agreed to proceed.   I discussed the assessment and treatment plan with the patient. The patient was provided an opportunity to ask questions and all were answered. The patient agreed with the plan and demonstrated an understanding of the instructions.   The patient was advised to call back or seek an in-person evaluation if the symptoms worsen or if the condition fails to improve as anticipated.   Jal MD OP Progress Note  08/02/2020 4:44 PM Ashley Fields  MRN:  [id number]  Chief Complaint:  Chief Complaint   Follow-up; Anxiety; Insomnia    HPI: Ashley Fields is a 65 year old Caucasian female, married, on disability, lives in [facility], has a history of bipolar disorder, PTSD, chronic pain, migraine headache, history of laparoscopic gastric bypass, was evaluated by telemedicine today.  Patient today reports she continues to struggle with her recent relationship struggles with her sister.  She reports she has been having a lot of rumination, racing thoughts about it.  She feels irritable when she does that.  She has been talking a lot about this to her husband who has been very supportive.  She has not been able to schedule an appointment with a therapist yet but agrees to contact [facility] today.  She reports the Lamictal at this dosage is helpful.  She wants to stay on this dose for now.  Denies side effects.  Patient reports she could not take the [facility] since it did not help her to sleep.  She had stopped taking it and is currently  back on the trazodone.  She takes trazodone 100 mg which helps her to rest better now.  She goes to bed anywhere between 8 PM to 10 PM however wakes up at around 2 AM.  It takes her another hour or so to fall back asleep however she is able to fall back asleep and sleeps until around 4 AM when she has to wake up.  She reports she is trying to schedule a later bedtime so she can sleep through the night.  Patient denies any suicidality, homicidality or perceptual disturbances.  Patient recently had a cardiology visit with Dr. 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  She has to go for testing on Monday.     Visit Diagnosis:    ICD-10-CM   1. Bipolar 1 disorder, mixed, mild (HCC)  F31.61     2. PTSD (post-traumatic stress disorder)  F43.10     3. Panic disorder  F41.0     4. Insomnia due to medical condition  G47.01    pain, mood      Past Psychiatric History: Reviewed past psychiatric history from progress note on 05/15/2018.  Past trials of Prozac, Wellbutrin, Lexapro, Cymbalta, Rexulti, Ambien-nightmares, lunesta  Past Medical History:  Past Medical History:  Diagnosis Date   Allergy    Anemia    Anxiety    Arthritis    Yes   Blood transfusion without reported diagnosis 1977   Transfusions from  miscarriage & hemorrhaging   Chronic kidney disease    Depression    Disp fx of cuboid bone of right foot, init for clos fx 01/01/2017   GERD (gastroesophageal reflux disease)    Headache    Hepatitis C    Hyperlipidemia    Neuromuscular disorder ([place]) 1997   Falling to much was an eye-opener   Osteoporosis    Thyroid disease    Urine incontinence     Past Surgical History:  Procedure Laterality Date   5 miscarriages     1977-1985, Blood transfusion s/p miscarriage 1977   ABDOMINAL HYSTERECTOMY  1987   Total   APPENDECTOMY  12/2008   AUGMENTATION  MAMMAPLASTY Bilateral 2015   Bilat   AUGMENTATION MAMMAPLASTY Bilateral 2018   implants redone w/ placement of implants under muscle   breast lift bilateral, implants  0000000   bilateral, silicon naturel   BREAST REDUCTION SURGERY Bilateral 1997   CESAREAN SECTION  07/27/1981   Placenta Previa   CHOLECYSTECTOMY  03/2004   Lap surgery   COLONOSCOPY WITH PROPOFOL N/A 06/13/2020   Procedure: COLONOSCOPY WITH PROPOFOL;  Surgeon: Virgel Manifold, MD;  Location: [facility] ENDOSCOPY;  Service: Endoscopy;  Laterality: N/A;   COSMETIC SURGERY     Breast implants w/ lift, tummy tuck, upper & lower Blepharop   ESOPHAGOGASTRODUODENOSCOPY (EGD) WITH PROPOFOL N/A 06/13/2020   Procedure: ESOPHAGOGASTRODUODENOSCOPY (EGD) WITH PROPOFOL;  Surgeon: Virgel Manifold, MD;  Location: [facility] ENDOSCOPY;  Service: Endoscopy;  Laterality: N/A;   EYE SURGERY  2017   Cataract surgery & lasik   GASTRIC BYPASS  2005   Laparoscopic   IVC FILTER INSERTION  12/2003   TrapEase Vena Cava Filter   LUMBAR LAMINECTOMY  06/2006   L4-L5 (spinal fusion)   mini tummy tuck  05/07/2016   Bilateral bra/back roll lift skin removal   neck surgery C3-C7  02/10/2015   ACDF   REDUCTION MAMMAPLASTY Bilateral 1997   SHOULDER ACROMIOPLASTY Left 03/27/2013   w/ labral debridement   SMALL INTESTINE SURGERY  12/24/2003   Lap Gastric Bypass   SPINAL CORD STIMULATOR IMPLANT  11/2010   removed 05/2011   SPINE SURGERY  2008 2017   Fusion L4-L5, ACDF C4-C7    Family Psychiatric History: Reviewed family psychiatric history from progress note on 05/15/2018.  Family History:  Family History  Problem Relation Age of Onset   COPD Mother    Lung cancer Mother    Diabetes Mother    Heart disease Mother    Stroke Mother    Heart attack Mother    Arthritis Mother    Cancer Mother    Hypertension Mother    Obesity Mother    Varicose Veins Mother    Heart disease Father    Heart attack Father 18   Early death Father    Depression  Sister        x 5 sisters   COPD Sister    Hypertension Sister    Heart disease Brother        x 3 brothers   Diabetes Brother    Colon polyps Sister    Hearing loss Sister    Heart attack Sister    Heart attack Brother    Arthritis Sister    Varicose Veins Sister    Arthritis Brother    Heart disease Brother    Hypertension Brother    Cancer Maternal Grandfather    Stroke Maternal Grandfather    COPD Sister  Obesity Sister    Depression Sister    Depression Sister    Heart disease Sister    Hypertension Sister    Diabetes Brother    Heart disease Brother    Hypertension Brother    Obesity Brother    Varicose Veins Brother    Heart disease Brother    Hypertension Brother    Obesity Brother    Hypertension Sister    Obesity Sister    Miscarriages / Stillbirths Maternal Aunt    Miscarriages / Stillbirths Paternal Aunt    Breast cancer Neg Hx     Social History: Reviewed social history from progress note on 05/15/2018 Social History   Socioeconomic History   Marital status: Married    Spouse name: Not on file   Number of children: 1   Years of education: High School   Highest education level: Some college, no degree  Occupational History   Occupation: disability  Tobacco Use   Smoking status: Former    Packs/day: 0.25    Years: 10.00    Pack years: 2.50    Types: Cigarettes    Quit date: 11/29/1995    Years since quitting: 24.6   Smokeless tobacco: Former   Tobacco comments:    Quite 1997, didnt smoke hardly at all when I was smoking.  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]   Social Determinants of  Radio broadcast assistant Strain: Low Risk    Difficulty of Paying Living Expenses: Not hard at all  Food Insecurity: No Food Insecurity   Worried About Charity fundraiser in the Last Year: Never true   Arboriculturist in the Last Year: Never true  Transportation Needs: No Transportation Needs   Lack of Transportation (Medical): No   Lack of Transportation (Non-Medical): No  Physical Activity: Inactive   Days of Exercise per Week: 0 days   Minutes of Exercise per Session: 0 min  Stress: Stress Concern Present   Feeling of Stress : Very much  Social Connections: Not on file    Allergies:  Allergies  Allergen Reactions   Flagyl [Metronidazole] Anaphylaxis   Cymbalta [Duloxetine Hcl]    Tape Rash    Metabolic Disorder Labs: Lab Results  Component Value Date   HGBA1C 5.0 03/30/2020   MPG 97 03/30/2020   MPG 97 03/17/2019   No results found for: PROLACTIN Lab Results  Component Value Date   CHOL 152 03/30/2020   TRIG 67 03/30/2020   HDL 89 03/30/2020   CHOLHDL 1.7 03/30/2020   LDLCALC 49 03/30/2020   LDLCALC 42 09/22/2019   Lab Results  Component Value Date   TSH 1.97 03/30/2020   TSH 0.86 08/11/2018    Therapeutic Level Labs: No results found for: LITHIUM No results found for: VALPROATE No components found for:  CBMZ  Current Medications: Current Outpatient Medications  Medication Sig Dispense Refill   albuterol (VENTOLIN HFA) 108 (90 Base) MCG/ACT inhaler Inhale 2 puffs into the lungs every 4 (four) hours as needed for wheezing or shortness of breath (cough). (Patient not taking: Reported on 08/01/2020) 8  g 2   ARIPiprazole (ABILIFY) 5 MG tablet TAKE ONE TABLET BY MOUTH DAILY 90 tablet 0   ASPIRIN LOW DOSE 81 MG EC tablet TAKE 1 TABLET BY MOUTH EVERY DAY 90 tablet 3   butalbital-acetaminophen-caffeine (FIORICET) 50-325-40 MG tablet Take 1 tablet by mouth 2 (two) times daily as needed for headache.     Cholecalciferol 250 MCG (10000 UT) CAPS Take by mouth.      cyanocobalamin (,VITAMIN B-12,) 1000 MCG/ML injection Inject 1,000 mcg into the muscle every 30 (thirty) days.     diazepam (VALIUM) 5 MG tablet Valium 5 mg tablet  1-2 tabs pre-procedure  take medicatin 40 min before the injection, do not drive (Patient not taking: No sig reported)     diclofenac Sodium (VOLTAREN) 1 % GEL Apply 2 g topically 4 (four) times daily.     estradiol (ESTRACE) 1 MG tablet TAKE ONE TABLET BY MOUTH DAILY 90 tablet 0   etodolac (LODINE) 500 MG tablet TAKE 1 TABLET BY MOUTH TWICE A DAY 180 tablet 1   ezetimibe (ZETIA) 10 MG tablet TAKE ONE TABLET BY MOUTH ONE TIME DAILY 90 tablet 1   fluticasone (FLONASE) 50 MCG/ACT nasal spray SPRAY 2 SPRAYS INTO EACH NOSTRIL EVERY DAY 48 mL 3   furosemide (LASIX) 20 MG tablet Take 1 tablet (20 mg total) by mouth daily as needed for fluid or edema. 90 tablet 0   gabapentin (NEURONTIN) 600 MG tablet Take 1 tablet (600 mg total) by mouth 3 (three) times daily. 90 tablet 3   hydrOXYzine (ATARAX/VISTARIL) 25 MG tablet TAKE 1/2 TO 1 TABLET BY MOUTH TWICE A DAY AS NEEDED FOR SEVERE PANIC ATTACKS/ ANXIETY 180 tablet 1   lamoTRIgine (LAMICTAL) 25 MG tablet Take 1 tablet (25 mg total) by mouth as directed. Start taking 1 tablet daily morning and 2 tablets daily evening 90 tablet 1   mometasone (ELOCON) 0.1 % cream Apply 1 application topically 2 (two) times daily.     omeprazole (PRILOSEC) 40 MG capsule Take by mouth.     ondansetron (ZOFRAN ODT) 4 MG disintegrating tablet Take 1 tablet (4 mg total) by mouth every 8 (eight) hours as needed for nausea or vomiting. 30 tablet 2   OZEMPIC, 1 MG/DOSE, 4 MG/3ML SOPN INJECT 1 MG INTO THE SKIN ONCE A WEEK. 3 mL 2   phentermine (ADIPEX-P) 37.5 MG tablet Take 37.5 mg by mouth at bedtime. (Patient not taking: Reported on 07/18/2020)     propranolol (INDERAL) 10 MG tablet TAKE ONE TABLET BY MOUTH THREE TIMES DAILY as needed for severe anxiety attacks (limited supply) 30 tablet 0   rosuvastatin (CRESTOR) 40  MG tablet Take 1 tablet (40 mg total) by mouth daily. 90 tablet 0   tiZANidine (ZANAFLEX) 4 MG tablet Take 4 mg by mouth 4 (four) times daily.     traMADol (ULTRAM) 50 MG tablet Take 50 mg by mouth 4 (four) times daily.     traZODone (DESYREL) 100 MG tablet Take 1 tablet (100 mg total) by mouth at bedtime. 90 tablet 0   venlafaxine XR (EFFEXOR-XR) 75 MG 24 hr capsule TAKE ONE CAPSULE BY MOUTH DAILY with breakfast 90 capsule 1   XIIDRA 5 % SOLN INSTILL 1 DROP INTO BOTH EYES TWICE A DAY     No current facility-administered medications for this visit.     Musculoskeletal: Strength & Muscle Tone:  UTA Gait & Station:  UTA Patient leans: N/A  Psychiatric Specialty Exam: Review of Systems  Psychiatric/Behavioral:  Positive for sleep disturbance. The patient is nervous/anxious.   All other systems reviewed and are negative.  There were no vitals taken for this visit.There is no height or weight on file to calculate BMI.  General Appearance: Casual  Eye Contact:  Fair  Speech:  Clear and Coherent  Volume:  Normal  Mood:  Anxious  Affect:  Congruent  Thought Process:  Goal Directed and Descriptions of Associations: Intact  Orientation:  Full (Time, Place, and Person)  Thought Content: Logical   Suicidal Thoughts:  No  Homicidal Thoughts:  No  Memory:  Immediate;   Fair Recent;   Fair Remote;   Fair  Judgement:  Fair  Insight:  Fair  Psychomotor Activity:  Normal  Concentration:  Concentration: Fair and Attention Span: Fair  Recall:  [employer] of Knowledge: Fair  Language: Fair  Akathisia:  No  Handed:  Right  AIMS (if indicated): not done  Assets:  Communication Skills Desire for Improvement Social Support  ADL's:  Intact  Cognition: WNL  Sleep:   improving   Screenings: GAD-7    Flowsheet Row Office Visit from 12/28/2019 in [facility] Office Visit from 03/17/2019 in [facility] Office Visit from 11/20/2018 in [facility]  Office Visit from 08/18/2018 in [facility] Office Visit from 07/22/2018 in [facility]  Total GAD-7 Score '2 2 4 9 8      '$ PHQ2-9    Flowsheet Row Video Visit from 08/02/2020 in [place] Video Visit from 07/18/2020 in [place] Video Visit from 04/25/2020 in [place] from 03/29/2020 in [facility] Office Visit from 12/28/2019 in [place]  PHQ-2 Total Score '1 6 2 4 '$ 0  PHQ-9 Total Score '10 21 5 9 1      '$ Flowsheet Row Video Visit from 08/02/2020 in [place] Admission (Discharged) from 06/13/2020 in [place] No Risk No Risk        Assessment and Plan: Ashley Fields is a 65 year old Caucasian female who has a history of PTSD, bipolar disorder, panic disorder, migraine headaches, chronic pain was evaluated by telemedicine today.  Patient is currently making progress with regards to her mood and sleep.  She however will benefit from psychotherapy Fields.  Plan as noted below.  Plan Bipolar disorder mixed,mild-some improvement Abilify 5 mg p.o. daily Lamotrigine 75 mg p.o. daily in divided dosage Gabapentin 600 mg p.o. 3 times daily-prescribed by primary care provider Venlafaxine extended release 75 mg p.o. daily  PTSD-stable Venlafaxine as prescribed Hydroxyzine 25 mg p.o. daily as needed for severe anxiety attacks  Panic disorder-stable Venlafaxine as prescribed  Insomnia-some improvement Trazodone 50 to 100 mg p.o. nightly as needed Discontinue Lunesta for side effects. Melatonin 10 mg p.o. nightly Discussed sleep hygiene techniques.  Patient advised to establish care with a therapist, she will call [employer].  I have reviewed notes per Dr. Ida Rogue, cardiology dated 08/01/2020 as noted above.  Follow-up in clinic in 4  weeks or sooner if needed.  This note was generated in part or whole with voice recognition software. Voice recognition is usually quite accurate but there are transcription errors that can and very often do occur. I apologize for any typographical errors that were not detected and corrected.         Ursula Alert, MD 08/03/2020, 6:03 PM

## 2020-08-08 ENCOUNTER — Other Ambulatory Visit: Payer: Self-pay

## 2020-08-08 ENCOUNTER — Ambulatory Visit
Admission: RE | Admit: 2020-08-08 | Discharge: 2020-08-08 | Disposition: A | Discharge status: Home / Self Care | Payer: 59 | Source: Ambulatory Visit | Attending: Cardiovascular Disease | Admitting: Cardiovascular Disease

## 2020-08-08 DIAGNOSIS — R072 Precordial pain: Secondary | ICD-10-CM

## 2020-08-08 LAB — NM MYOCAR MULTI W/SPECT W/WALL MOTION / EF
Estimated workload: 1 METS
Exercise duration (min): 0 min
Exercise duration (sec): 0 s
LV dias vol: 59 mL (ref 46–106)
LV sys vol: 16 mL
MPHR: 156 {beats}/min
Peak HR: 105 {beats}/min
Percent HR: 67 %
Rest HR: 79 {beats}/min
SDS: 0
SRS: 9
SSS: 4
TID: 1.03

## 2020-08-08 MED ORDER — REGADENOSON 0.4 MG/5ML IV SOLN
0.4000 mg | Freq: Once | INTRAVENOUS | Status: AC
  Administered 2020-08-08: 0.4 mg via INTRAVENOUS

## 2020-08-08 MED ORDER — TECHNETIUM TC 99M TETROFOSMIN IV KIT
31.6100 | PACK | Freq: Once | INTRAVENOUS | Status: AC
  Administered 2020-08-08: 31.61 via INTRAVENOUS

## 2020-08-08 MED ORDER — TECHNETIUM TC 99M TETROFOSMIN IV KIT
10.0000 | PACK | Freq: Once | INTRAVENOUS | Status: AC | PRN
  Administered 2020-08-08: 11.2 via INTRAVENOUS

## 2020-08-09 ENCOUNTER — Other Ambulatory Visit: Payer: Self-pay | Admitting: Psychiatry

## 2020-08-09 DIAGNOSIS — F4310 Post-traumatic stress disorder, unspecified: Secondary | ICD-10-CM

## 2020-08-10 ENCOUNTER — Encounter: Payer: Self-pay | Admitting: Internal Medicine

## 2020-08-14 ENCOUNTER — Other Ambulatory Visit: Payer: Self-pay | Admitting: Psychiatry

## 2020-08-14 DIAGNOSIS — F3161 Bipolar disorder, current episode mixed, mild: Secondary | ICD-10-CM

## 2020-08-14 DIAGNOSIS — F410 Panic disorder [episodic paroxysmal anxiety] without agoraphobia: Secondary | ICD-10-CM

## 2020-08-14 DIAGNOSIS — F4310 Post-traumatic stress disorder, unspecified: Secondary | ICD-10-CM

## 2020-08-15 ENCOUNTER — Other Ambulatory Visit: Payer: Self-pay | Admitting: Psychiatry

## 2020-08-15 DIAGNOSIS — F4310 Post-traumatic stress disorder, unspecified: Secondary | ICD-10-CM

## 2020-08-18 ENCOUNTER — Other Ambulatory Visit: Payer: Self-pay

## 2020-08-18 ENCOUNTER — Telehealth: Payer: Self-pay

## 2020-08-18 DIAGNOSIS — G8929 Other chronic pain: Secondary | ICD-10-CM

## 2020-08-18 DIAGNOSIS — Z90710 Acquired absence of both cervix and uterus: Secondary | ICD-10-CM

## 2020-08-18 DIAGNOSIS — F410 Panic disorder [episodic paroxysmal anxiety] without agoraphobia: Secondary | ICD-10-CM

## 2020-08-18 DIAGNOSIS — G894 Chronic pain syndrome: Secondary | ICD-10-CM

## 2020-08-18 DIAGNOSIS — F3176 Bipolar disorder, in full remission, most recent episode depressed: Secondary | ICD-10-CM

## 2020-08-18 DIAGNOSIS — F4310 Post-traumatic stress disorder, unspecified: Secondary | ICD-10-CM

## 2020-08-18 MED ORDER — GABAPENTIN 600 MG PO TABS: 600.0000 mg | ORAL_TABLET | Freq: Three times a day (TID) | ORAL | 3 refills | Status: DC

## 2020-08-18 MED ORDER — ESTRADIOL 1 MG PO TABS: 1.0000 mg | ORAL_TABLET | Freq: Every day | ORAL | 0 refills | Status: DC

## 2020-08-18 MED ORDER — LAMOTRIGINE 25 MG PO TABS: 25.0000 mg | ORAL_TABLET | ORAL | 0 refills | Status: DC

## 2020-08-18 MED ORDER — PROPRANOLOL HCL 10 MG PO TABS: ORAL_TABLET | ORAL | 0 refills | Status: DC

## 2020-08-18 NOTE — Telephone Encounter (Signed)
I have sent Lamictal and propranolol-90-day supply to [facility] per her request

## 2020-08-18 NOTE — Telephone Encounter (Signed)
received fax requesting a 90 day supply of the lamotrigine and propanolol sent to [facility]  ([facility])

## 2020-08-19 ENCOUNTER — Ambulatory Visit: Payer: Medicare HMO | Admitting: Physician Assistant

## 2020-08-19 ENCOUNTER — Encounter: Payer: Self-pay | Admitting: Internal Medicine

## 2020-08-24 ENCOUNTER — Ambulatory Visit
Admission: RE | Admit: 2020-08-24 | Discharge: 2020-08-24 | Disposition: A | Discharge status: Home / Self Care | Payer: 59 | Source: Ambulatory Visit | Attending: Family Medicine | Admitting: Family Medicine

## 2020-08-24 ENCOUNTER — Other Ambulatory Visit: Payer: Self-pay

## 2020-08-24 DIAGNOSIS — Z1231 Encounter for screening mammogram for malignant neoplasm of breast: Secondary | ICD-10-CM | POA: Diagnosis not present

## 2020-08-26 DIAGNOSIS — N393 Stress incontinence (female) (male): Secondary | ICD-10-CM | POA: Diagnosis not present

## 2020-08-26 DIAGNOSIS — Z01411 Encounter for gynecological examination (general) (routine) with abnormal findings: Secondary | ICD-10-CM | POA: Diagnosis not present

## 2020-08-26 DIAGNOSIS — Z1331 Encounter for screening for depression: Secondary | ICD-10-CM | POA: Diagnosis not present

## 2020-08-30 ENCOUNTER — Other Ambulatory Visit: Payer: Self-pay | Admitting: Obstetrics and Gynecology

## 2020-08-30 DIAGNOSIS — S299XXA Unspecified injury of thorax, initial encounter: Secondary | ICD-10-CM

## 2020-08-31 ENCOUNTER — Other Ambulatory Visit: Payer: Self-pay | Admitting: Obstetrics and Gynecology

## 2020-08-31 ENCOUNTER — Other Ambulatory Visit: Payer: Self-pay | Admitting: Psychiatry

## 2020-08-31 DIAGNOSIS — M48062 Spinal stenosis, lumbar region with neurogenic claudication: Secondary | ICD-10-CM | POA: Diagnosis not present

## 2020-08-31 DIAGNOSIS — S299XXA Unspecified injury of thorax, initial encounter: Secondary | ICD-10-CM

## 2020-09-01 ENCOUNTER — Ambulatory Visit
Admission: RE | Admit: 2020-09-01 | Discharge: 2020-09-01 | Disposition: A | Discharge status: Home / Self Care | Payer: 59 | Source: Ambulatory Visit | Attending: Obstetrics and Gynecology | Admitting: Obstetrics and Gynecology

## 2020-09-01 ENCOUNTER — Other Ambulatory Visit: Payer: Self-pay | Admitting: Family Medicine

## 2020-09-01 ENCOUNTER — Other Ambulatory Visit: Payer: Self-pay

## 2020-09-01 ENCOUNTER — Other Ambulatory Visit: Payer: 59

## 2020-09-01 ENCOUNTER — Encounter: Payer: Self-pay | Admitting: Internal Medicine

## 2020-09-01 DIAGNOSIS — X58XXXA Exposure to other specified factors, initial encounter: Secondary | ICD-10-CM | POA: Diagnosis not present

## 2020-09-01 DIAGNOSIS — R922 Inconclusive mammogram: Secondary | ICD-10-CM | POA: Diagnosis not present

## 2020-09-01 DIAGNOSIS — S299XXA Unspecified injury of thorax, initial encounter: Secondary | ICD-10-CM | POA: Insufficient documentation

## 2020-09-01 DIAGNOSIS — N644 Mastodynia: Secondary | ICD-10-CM | POA: Diagnosis not present

## 2020-09-01 DIAGNOSIS — Z9882 Breast implant status: Secondary | ICD-10-CM | POA: Diagnosis not present

## 2020-09-01 DIAGNOSIS — K219 Gastro-esophageal reflux disease without esophagitis: Secondary | ICD-10-CM

## 2020-09-01 DIAGNOSIS — R234 Changes in skin texture: Secondary | ICD-10-CM | POA: Diagnosis not present

## 2020-09-01 NOTE — Telephone Encounter (Signed)
  Notes to clinic: Medication was filled by a historical provider  Review for refill    Requested Prescriptions  Pending Prescriptions Disp Refills   omeprazole (PRILOSEC) 40 MG capsule [Pharmacy Med Name: OMEPRAZOLE DR 40 MG CAPSULE] 90 capsule 1    Sig: TAKE 1 CAPSULE BY MOUTH EVERY DAY     Gastroenterology: Proton Pump Inhibitors Passed - 09/01/2020  8:16 AM      Passed - Valid encounter within last 12 months    Recent Outpatient Visits           4 months ago Annual physical exam   [facility] Olin Hauser, DO   7 months ago Acute viral pharyngitis   [facility] Olin Hauser, DO   8 months ago Morbid obesity [facility])   [place], DO   8 months ago Acute pain of left knee   [place], DO   9 months ago Hordeolum internum of right upper eyelid   [place], DO       Future Appointments             In 5 days Virgel Manifold, MD [place]   In 5 months Gollan, Kathlene November, MD [facility] [place]   In 7 months  [facility], [facility]

## 2020-09-02 ENCOUNTER — Inpatient Hospital Stay: Payer: 59 | Attending: Internal Medicine

## 2020-09-02 DIAGNOSIS — D509 Iron deficiency anemia, unspecified: Secondary | ICD-10-CM | POA: Diagnosis not present

## 2020-09-02 DIAGNOSIS — Z79899 Other long term (current) drug therapy: Secondary | ICD-10-CM | POA: Diagnosis not present

## 2020-09-02 DIAGNOSIS — D508 Other iron deficiency anemias: Secondary | ICD-10-CM

## 2020-09-02 LAB — CBC WITH DIFFERENTIAL/PLATELET
Abs Immature Granulocytes: 0.01 10*3/uL (ref 0.00–0.07)
Basophils Absolute: 0 10*3/uL (ref 0.0–0.1)
Basophils Relative: 0 %
Eosinophils Absolute: 0.2 10*3/uL (ref 0.0–0.5)
Eosinophils Relative: 3 %
HCT: 40.9 % (ref 36.0–46.0)
Hemoglobin: 13.1 g/dL (ref 12.0–15.0)
Immature Granulocytes: 0 %
Lymphocytes Relative: 24 %
Lymphs Abs: 1.2 10*3/uL (ref 0.7–4.0)
MCH: 30.3 pg (ref 26.0–34.0)
MCHC: 32 g/dL (ref 30.0–36.0)
MCV: 94.5 fL (ref 80.0–100.0)
Monocytes Absolute: 0.4 10*3/uL (ref 0.1–1.0)
Monocytes Relative: 8 %
Neutro Abs: 3.3 10*3/uL (ref 1.7–7.7)
Neutrophils Relative %: 65 %
Platelets: 152 10*3/uL (ref 150–400)
RBC: 4.33 MIL/uL (ref 3.87–5.11)
RDW: 12.4 % (ref 11.5–15.5)
WBC: 5.2 10*3/uL (ref 4.0–10.5)
nRBC: 0 % (ref 0.0–0.2)

## 2020-09-02 LAB — BASIC METABOLIC PANEL
Anion gap: 7 (ref 5–15)
BUN: 14 mg/dL (ref 8–23)
CO2: 29 mmol/L (ref 22–32)
Calcium: 9.2 mg/dL (ref 8.9–10.3)
Chloride: 103 mmol/L (ref 98–111)
Creatinine, Ser: 0.78 mg/dL (ref 0.44–1.00)
GFR, Estimated: >60 mL/min (ref >60)
Glucose, Bld: 89 mg/dL (ref 70–99)
Potassium: 4.2 mmol/L (ref 3.5–5.1)
Sodium: 139 mmol/L (ref 135–145)

## 2020-09-02 LAB — IRON AND TIBC
Iron: 100 ug/dL (ref 28–170)
Saturation Ratios: 23 % (ref 10.4–31.8)
TIBC: 444 ug/dL (ref 250–450)
UIBC: 344 ug/dL

## 2020-09-02 LAB — FERRITIN: Ferritin: 15 ng/mL (ref 11–307)

## 2020-09-05 ENCOUNTER — Inpatient Hospital Stay: Payer: 59

## 2020-09-05 ENCOUNTER — Other Ambulatory Visit: Payer: Self-pay

## 2020-09-05 ENCOUNTER — Inpatient Hospital Stay (HOSPITAL_BASED_OUTPATIENT_CLINIC_OR_DEPARTMENT_OTHER): Payer: 59 | Admitting: Internal Medicine

## 2020-09-05 DIAGNOSIS — D508 Other iron deficiency anemias: Secondary | ICD-10-CM

## 2020-09-05 DIAGNOSIS — D509 Iron deficiency anemia, unspecified: Secondary | ICD-10-CM | POA: Diagnosis not present

## 2020-09-05 MED ORDER — SODIUM CHLORIDE 0.9 % IV SOLN
Freq: Once | INTRAVENOUS | Status: AC
  Filled 2020-09-05: qty 250

## 2020-09-05 MED ORDER — IRON SUCROSE 20 MG/ML IV SOLN
200.0000 mg | Freq: Once | INTRAVENOUS | Status: AC
  Administered 2020-09-05: 200 mg via INTRAVENOUS
  Filled 2020-09-05: qty 10

## 2020-09-05 NOTE — Assessment & Plan Note (Signed)
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  On IV iron.  Positive fatigue. STABLE.   # Proceed with Venofer today.  Hemoglobin 13; saturation 23%;Ferritin-15;  continue maintenance iron infusion.   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  Stable  # Mild intermittent thrombocytopenia-159- normal.   # DISPOSITION:  #  VENOFER TODAY # follow up in 6 months NP- labs-1-2 days PRIOR cbc/b12/ iron studies/ferrtin possible VENOFER- Dr.B

## 2020-09-05 NOTE — Progress Notes (Signed)
[facility] CONSULT NOTE  Patient Care Team: Olin Hauser, DO as PCP - General (Family Medicine)  CHIEF COMPLAINTS/PURPOSE OF CONSULTATION: Iron deficiency   HEMATOLOGY HISTORY  # ANEMIA IRON DEFICIENCY GASTRIC BYPASS [2005 [place], [place]] EGD-none; colonoscopy-2015- few polyps snared [[place]]; IV venofer x3; fall 2020/ PO B12 [intol to SL b12]   HISTORY OF PRESENTING ILLNESS:  Ashley Fields 65 y.o.  female history of iron deficiency secondary to gastric bypass is here for follow-up.  Complains of mild to moderate fatigue.  Otherwise no blood in stools or black or stools.   Review of Systems  Constitutional:  Positive for malaise/fatigue. Negative for chills, diaphoresis, fever and weight loss.  HENT:  Negative for nosebleeds and sore throat.   Eyes:  Negative for double vision.  Respiratory:  Negative for cough, hemoptysis, sputum production and wheezing.   Cardiovascular:  Negative for chest pain, palpitations, orthopnea and leg swelling.  Gastrointestinal:  Negative for abdominal pain, blood in stool, constipation, diarrhea, heartburn, melena, nausea and vomiting.  Genitourinary:  Negative for dysuria, frequency and urgency.  Musculoskeletal:  Negative for back pain and joint pain.  Skin: Negative.  Negative for itching and rash.  Neurological:  Negative for dizziness, tingling, focal weakness, weakness and headaches.  Endo/Heme/Allergies:  Does not bruise/bleed easily.  Psychiatric/Behavioral:  Negative for depression. The patient is not nervous/anxious and does not have insomnia.    MEDICAL HISTORY:  Past Medical History:  Diagnosis Date  . Allergy   . Anemia   . Anxiety   . Arthritis    Yes  . Blood transfusion without reported diagnosis 1977   Transfusions from miscarriage & hemorrhaging  . Chronic kidney disease   . Depression   . Disp fx of cuboid bone of right foot, init for clos fx 01/01/2017  . GERD (gastroesophageal reflux disease)   .  Headache   . Hepatitis C   . Hyperlipidemia   . Neuromuscular disorder ([place]) 1997   Falling to much was an eye-opener  . Osteoporosis   . Thyroid disease   . Urine incontinence     SURGICAL HISTORY: Past Surgical History:  Procedure Laterality Date  . 5 miscarriages     1977-1985, Blood transfusion s/p miscarriage 1977  . ABDOMINAL HYSTERECTOMY  1987   Total  . APPENDECTOMY  12/2008  . AUGMENTATION MAMMAPLASTY Bilateral 2015   Bilat  . AUGMENTATION MAMMAPLASTY Bilateral 2018   implants redone w/ placement of implants under muscle  . breast lift bilateral, implants  0000000   bilateral, silicon naturel  . BREAST REDUCTION SURGERY Bilateral 1997  . CESAREAN SECTION  07/27/1981   Placenta Previa  . CHOLECYSTECTOMY  03/2004   Lap surgery  . COLONOSCOPY WITH PROPOFOL N/A 06/13/2020   Procedure: COLONOSCOPY WITH PROPOFOL;  Surgeon: Virgel Manifold, MD;  Location: [facility] ENDOSCOPY;  Service: Endoscopy;  Laterality: N/A;  . COSMETIC SURGERY     Breast implants w/ lift, tummy tuck, upper & lower Blepharop  . ESOPHAGOGASTRODUODENOSCOPY (EGD) WITH PROPOFOL N/A 06/13/2020   Procedure: ESOPHAGOGASTRODUODENOSCOPY (EGD) WITH PROPOFOL;  Surgeon: Virgel Manifold, MD;  Location: [facility] ENDOSCOPY;  Service: Endoscopy;  Laterality: N/A;  . EYE SURGERY  2017   Cataract surgery & lasik  . GASTRIC BYPASS  2005   Laparoscopic  . IVC FILTER INSERTION  12/2003   TrapEase Vena Cava Filter  . LUMBAR LAMINECTOMY  06/2006   L4-L5 (spinal fusion)  . mini tummy tuck  05/07/2016   Bilateral bra/back roll lift  skin removal  . neck surgery C3-C7  02/10/2015   ACDF  . REDUCTION MAMMAPLASTY Bilateral 1997  . SHOULDER ACROMIOPLASTY Left 03/27/2013   w/ labral debridement  . SMALL INTESTINE SURGERY  12/24/2003   Lap Gastric Bypass  . SPINAL CORD STIMULATOR IMPLANT  11/2010   removed 05/2011  . SPINE SURGERY  2008 2017   Fusion L4-L5, ACDF C4-C7    SOCIAL HISTORY: Social History    Socioeconomic History  . Marital status: Married    Spouse name: Not on file  . Number of children: 1  . Years of education: [employer]  . Highest education level: Some college, no degree  Occupational History  . Occupation: disability  Tobacco Use  . Smoking status: Former    Packs/day: 0.25    Years: 10.00    Pack years: 2.50    Types: Cigarettes    Quit date: 11/29/1995    Years since quitting: 24.8  . Smokeless tobacco: Former  . Tobacco comments:    Quite 1997, didnt smoke hardly at all when I was smoking.  Vaping Use  . Vaping Use: Never used  Substance and Sexual Activity  . Alcohol use: No  . Drug use: No  . Sexual activity: Yes    Birth control/protection: Condom, Post-menopausal, Surgical    Comment: Total Hysterectomy condoms  Other Topics Concern  . 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 Difficulty of Paying Living Expenses: Not hard at all  Food Insecurity: No Food Insecurity  . Worried About Charity fundraiser in the Last Year: Never true  . Ran Out of Food in the Last Year: Never true  Transportation Needs: No Transportation Needs  . Lack of Transportation (Medical): No  . Lack of Transportation (Non-Medical): No  Physical Activity: Inactive  . Days of Exercise per Week: 0 days  . Minutes of Exercise per Session: 0 min  Stress: Stress Concern Present  . Feeling of Stress : Very much  Social Connections: Not on file  Intimate Partner Violence: Not on file    FAMILY HISTORY: Family History  Problem Relation Age of Onset  . COPD Mother   . Lung cancer Mother   . Diabetes Mother   . Heart disease Mother   . Stroke Mother   . Heart attack Mother   . Arthritis Mother   . Cancer Mother   . Hypertension Mother   . Obesity Mother   . Varicose Veins Mother   . Heart  disease Father   . Heart attack Father 75  . Early death Father   . Depression Sister        x 5 sisters  . COPD Sister   . Hypertension Sister   . Heart disease Brother        x 3 brothers  . Diabetes Brother   . Colon polyps Sister   . Hearing loss Sister   . Heart attack Sister   . Heart attack Brother   . Arthritis Sister   . Varicose Veins Sister   . Arthritis Brother   . Heart disease Brother   . Hypertension Brother   . Cancer Maternal Grandfather   . Stroke Maternal Grandfather   . COPD Sister   . Obesity Sister   . Depression Sister   . Depression  Sister   . Heart disease Sister   . Hypertension Sister   . Diabetes Brother   . Heart disease Brother   . Hypertension Brother   . Obesity Brother   . Varicose Veins Brother   . Heart disease Brother   . Hypertension Brother   . Obesity Brother   . Hypertension Sister   . Obesity Sister   . Miscarriages / Stillbirths Maternal Aunt   . Miscarriages / Stillbirths Paternal Aunt   . Breast cancer Neg Hx     ALLERGIES:  is allergic to flagyl [metronidazole], cymbalta [duloxetine hcl], and tape.  MEDICATIONS:  Current Outpatient Medications  Medication Sig Dispense Refill  . ASPIRIN LOW DOSE 81 MG EC tablet TAKE 1 TABLET BY MOUTH EVERY DAY 90 tablet 3  . butalbital-acetaminophen-caffeine (FIORICET) 50-325-40 MG tablet Take 1 tablet by mouth 2 (two) times daily as needed for headache.    . Cholecalciferol 250 MCG (10000 UT) CAPS Take by mouth.    . cyanocobalamin (,VITAMIN B-12,) 1000 MCG/ML injection Inject 1,000 mcg into the muscle every 30 (thirty) days.    . diclofenac Sodium (VOLTAREN) 1 % GEL Apply 2 g topically 4 (four) times daily.    Marland Kitchen estradiol (ESTRACE) 1 MG tablet Take 1 tablet (1 mg total) by mouth daily. 90 tablet 0  . etodolac (LODINE) 500 MG tablet TAKE 1 TABLET BY MOUTH TWICE A DAY 180 tablet 1  . ezetimibe (ZETIA) 10 MG tablet TAKE ONE TABLET BY MOUTH ONE TIME DAILY 90 tablet 1  . fluticasone  (FLONASE) 50 MCG/ACT nasal spray SPRAY 2 SPRAYS INTO EACH NOSTRIL EVERY DAY 48 mL 3  . gabapentin (NEURONTIN) 600 MG tablet Take 1 tablet (600 mg total) by mouth 3 (three) times daily. 90 tablet 3  . hydrOXYzine (ATARAX/VISTARIL) 25 MG tablet TAKE 1/2 TO 1 TABLET BY MOUTH TWICE A DAY AS NEEDED FOR SEVERE PANIC ATTACKS/ ANXIETY 180 tablet 1  . mometasone (ELOCON) 0.1 % cream Apply 1 application topically 2 (two) times daily.    . ondansetron (ZOFRAN ODT) 4 MG disintegrating tablet Take 1 tablet (4 mg total) by mouth every 8 (eight) hours as needed for nausea or vomiting. 30 tablet 2  . propranolol (INDERAL) 10 MG tablet TAKE ONE TABLET BY MOUTH THREE TIMES DAILY as needed for severe anxiety attacks (limited supply) 90 tablet 0  . tiZANidine (ZANAFLEX) 4 MG tablet Take 4 mg by mouth 4 (four) times daily.    . traMADol (ULTRAM) 50 MG tablet Take 50 mg by mouth 4 (four) times daily.    . traZODone (DESYREL) 100 MG tablet Take 1 tablet (100 mg total) by mouth at bedtime. 90 tablet 0  . venlafaxine XR (EFFEXOR-XR) 75 MG 24 hr capsule TAKE ONE CAPSULE BY MOUTH DAILY with breakfast 90 capsule 1  . XIIDRA 5 % SOLN INSTILL 1 DROP INTO BOTH EYES TWICE A DAY    . ARIPiprazole (ABILIFY) 5 MG tablet Take 0.5 tablets (2.5 mg total) by mouth daily. 90 tablet 0  . furosemide (LASIX) 20 MG tablet TAKE 1 TABLET EVERY DAY FOR FLUID OR EDEMA AS NEEDED 90 tablet 0  . lamoTRIgine (LAMICTAL) 100 MG tablet Take 0.5 tablets (50 mg total) by mouth 2 (two) times daily. 90 tablet 0  . lamoTRIgine (LAMICTAL) 25 MG tablet Take 1 tablet (25 mg total) by mouth daily. Take along with 100 mg daily , total of 125 mg 90 tablet 0  . LORazepam (ATIVAN) 0.5 MG tablet Ativan 0.5 mg  tablet  1 tab - 40 min before injection  bring with you on the day of the injection and take it 40 min prior to the procedure    . OZEMPIC, 1 MG/DOSE, 4 MG/3ML SOPN INJECT '1MG'$  INTO THE SKIN ONCE A WEEK 3 mL 2  . rosuvastatin (CRESTOR) 40 MG tablet TAKE 1  TABLET EVERY DAY 90 tablet 1   No current facility-administered medications for this visit.      PHYSICAL EXAMINATION:   Vitals:   09/05/20 1309  BP: 139/86  Pulse: 90  Resp: 18  Temp: (!) 96.2 F (35.7 C)   Filed Weights   09/05/20 1309  Weight: 195 lb (88.5 kg)    Physical Exam HENT:     Head: Normocephalic and atraumatic.     Mouth/Throat:     Pharynx: No oropharyngeal exudate.  Eyes:     Pupils: Pupils are equal, round, and reactive to light.  Cardiovascular:     Rate and Rhythm: Normal rate and regular rhythm.  Pulmonary:     Effort: Pulmonary effort is normal. No respiratory distress.     Breath sounds: Normal breath sounds. No wheezing.  Abdominal:     General: Bowel sounds are normal. There is no distension.     Palpations: Abdomen is soft. There is no mass.     Tenderness: There is no abdominal tenderness. There is no guarding or rebound.  Musculoskeletal:        General: No tenderness. Normal range of motion.     Cervical back: Normal range of motion and neck supple.  Skin:    General: Skin is warm.  Neurological:     Mental Status: She is alert and oriented to person, place, and time.  Psychiatric:        Mood and Affect: Affect normal.    LABORATORY DATA:  I have reviewed the data as listed Lab Results  Component Value Date   WBC 5.2 09/02/2020   HGB 13.1 09/02/2020   HCT 40.9 09/02/2020   MCV 94.5 09/02/2020   PLT 152 09/02/2020   Recent Labs    12/28/19 0836 03/30/20 0802 09/02/20 1050  NA 142 142 139  K 4.1 4.6 4.2  CL 101 103 103  CO2 27 32 29  GLUCOSE 80 85 89  BUN '18 15 14  '$ CREATININE 1.01* 0.96 0.78  CALCIUM 9.4 9.4 9.2  GFRNONAA 59* 63 >60  GFRAA 69 73  --   PROT  --  6.8  --   AST  --  26  --   ALT  --  23  --   BILITOT  --  0.8  --      No results found.  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  On IV iron.   Positive fatigue. STABLE.   # Proceed with Venofer today.  Hemoglobin 13; saturation 23%;Ferritin-15;  continue maintenance iron infusion.   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  Stable  # Mild intermittent thrombocytopenia-159- normal.   # DISPOSITION:  #  VENOFER TODAY # follow up in 6 months NP- labs-1-2 days PRIOR cbc/b12/ iron studies/ferrtin possible VENOFER- Dr.B  All questions were answered. The patient knows to call the clinic with any problems, questions or concerns.    Cammie Sickle, MD 10/05/2020 6:31 PM

## 2020-09-05 NOTE — Patient Instructions (Signed)
[facility] ONCOLOGY  Discharge Instructions: Thank you for choosing [facility] to provide your oncology and hematology care.  If you have a lab appointment with the [facility], please go directly to the [facility] and check in at the registration area.  Wear comfortable clothing and clothing appropriate for easy access to any Portacath or PICC line.   We strive to give you quality time with your provider. You may need to reschedule your appointment if you arrive late (15 or more minutes).  Arriving late affects you and other patients whose appointments are after yours.  Also, if you miss three or more appointments without notifying the office, you may be dismissed from the clinic at the provider's discretion.      For prescription refill requests, have your pharmacy contact our office and allow 72 hours for refills to be completed.    Today you received the following chemotherapy and/or immunotherapy agents Iron Sucrose injection What is this medication? IRON SUCROSE (AHY ern SOO krohs) is an iron complex. Iron is used to make healthy red blood cells, which carry oxygen and nutrients throughout the body. This medicine is used to treat iron deficiency anemia in people with chronickidney disease. This medicine may be used for other purposes; ask your health care provider orpharmacist if you have questions. COMMON BRAND NAME(S): Venofer What should I tell my care team before I take this medication? They need to know if you have any of these conditions: anemia not caused by low iron levels heart disease high levels of iron in the blood kidney disease liver disease an unusual or allergic reaction to iron, other medicines, foods, dyes, or preservatives pregnant or trying to get pregnant breast-feeding How should I use this medication? This medicine is for infusion into a vein. It is given by a health careprofessional in a hospital or clinic  setting. Talk to your pediatrician regarding the use of this medicine in children. While this drug may be prescribed for children as young as 2 years for selectedconditions, precautions do apply. Overdosage: If you think you have taken too much of this medicine contact apoison control center or emergency room at once. NOTE: This medicine is only for you. Do not share this medicine with others. What if I miss a dose? It is important not to miss your dose. Call your doctor or health careprofessional if you are unable to keep an appointment. What may interact with this medication? Do not take this medicine with any of the following medications: deferoxamine dimercaprol other iron products This medicine may also interact with the following medications: chloramphenicol deferasirox This list may not describe all possible interactions. Give your health care provider a list of all the medicines, herbs, non-prescription drugs, or dietary supplements you use. Also tell them if you smoke, drink alcohol, or use illegaldrugs. Some items may interact with your medicine. What should I watch for while using this medication? Visit your doctor or healthcare professional regularly. Tell your doctor or healthcare professional if your symptoms do not start to get better or if theyget worse. You may need blood work done while you are taking this medicine. You may need to follow a special diet. Talk to your doctor. Foods that contain iron include: whole grains/cereals, dried fruits, beans, or peas, leafy greenvegetables, and organ meats (liver, kidney). What side effects may I notice from receiving this medication? Side effects that you should report to your doctor or health care professionalas soon as  possible: allergic reactions like skin rash, itching or hives, swelling of the face, lips, or tongue breathing problems changes in blood pressure cough fast, irregular heartbeat feeling faint or lightheaded,  falls fever or chills flushing, sweating, or hot feelings joint or muscle aches/pains seizures swelling of the ankles or feet unusually weak or tired Side effects that usually do not require medical attention (report to yourdoctor or health care professional if they continue or are bothersome): diarrhea feeling achy headache irritation at site where injected nausea, vomiting stomach upset tiredness This list may not describe all possible side effects. Call your doctor for medical advice about side effects. You may report side effects to FDA at1-800-FDA-1088. Where should I keep my medication? This drug is given in a hospital or clinic and will not be stored at home. NOTE: This sheet is a summary. It may not cover all possible information. If you have questions about this medicine, talk to your doctor, pharmacist, orhealth care provider.  2022 Elsevier/Gold Standard (2010-10-05 17:14:35)       To help prevent nausea and vomiting after your treatment, we encourage you to take your nausea medication as directed.  BELOW ARE SYMPTOMS THAT SHOULD BE REPORTED IMMEDIATELY: *FEVER GREATER THAN 100.4 F (38 C) OR HIGHER *CHILLS OR SWEATING *NAUSEA AND VOMITING THAT IS NOT CONTROLLED WITH YOUR NAUSEA MEDICATION *UNUSUAL SHORTNESS OF BREATH *UNUSUAL BRUISING OR BLEEDING *URINARY PROBLEMS (pain or burning when urinating, or frequent urination) *BOWEL PROBLEMS (unusual diarrhea, constipation, pain near the anus) TENDERNESS IN MOUTH AND THROAT WITH OR WITHOUT PRESENCE OF ULCERS (sore throat, sores in mouth, or a toothache) UNUSUAL RASH, SWELLING OR PAIN  UNUSUAL VAGINAL DISCHARGE OR ITCHING   Items with * indicate a potential emergency and should be followed up as soon as possible or go to the Emergency Department if any problems should occur.  Please show the CHEMOTHERAPY ALERT CARD or IMMUNOTHERAPY ALERT CARD at check-in to the Emergency Department and triage nurse.  Should you have  questions after your visit or need to cancel or reschedule your appointment, please contact [facility]  [phone] and follow the prompts.  Office hours are 8:00 a.m. to 4:30 p.m. Monday - Friday. Please note that voicemails left after 4:00 p.m. may not be returned until the following business day.  We are closed weekends and major holidays. You have access to a nurse at all times for urgent questions. Please call the main number to the clinic [phone] and follow the prompts.  For any non-urgent questions, you may also contact your provider using MyChart. We now offer e-Visits for anyone 72 and older to request care online for non-urgent symptoms. For details visit [email or web address].   Also download the MyChart app! Go to the app store, search "MyChart", open the app, select [facility], and log in with your MyChart username and password.  Due to Covid, a mask is required upon entering the hospital/clinic. If you do not have a mask, one will be given to you upon arrival. For doctor visits, patients may have 1 support person aged 24 or older with them. For treatment visits, patients cannot have anyone with them due to current Covid guidelines and our immunocompromised population.   Iron Sucrose injection What is this medication? IRON SUCROSE (AHY ern SOO krohs) is an iron complex. Iron is used to make healthy red blood cells, which carry oxygen and nutrients throughout the body. This medicine is used to treat iron deficiency anemia in people with chronickidney  disease. This medicine may be used for other purposes; ask your health care provider orpharmacist if you have questions. COMMON BRAND NAME(S): Venofer What should I tell my care team before I take this medication? They need to know if you have any of these conditions: anemia not caused by low iron levels heart disease high levels of iron in the blood kidney disease liver disease an unusual or  allergic reaction to iron, other medicines, foods, dyes, or preservatives pregnant or trying to get pregnant breast-feeding How should I use this medication? This medicine is for infusion into a vein. It is given by a health careprofessional in a hospital or clinic setting. Talk to your pediatrician regarding the use of this medicine in children. While this drug may be prescribed for children as young as 2 years for selectedconditions, precautions do apply. Overdosage: If you think you have taken too much of this medicine contact apoison control center or emergency room at once. NOTE: This medicine is only for you. Do not share this medicine with others. What if I miss a dose? It is important not to miss your dose. Call your doctor or health careprofessional if you are unable to keep an appointment. What may interact with this medication? Do not take this medicine with any of the following medications: deferoxamine dimercaprol other iron products This medicine may also interact with the following medications: chloramphenicol deferasirox This list may not describe all possible interactions. Give your health care provider a list of all the medicines, herbs, non-prescription drugs, or dietary supplements you use. Also tell them if you smoke, drink alcohol, or use illegaldrugs. Some items may interact with your medicine. What should I watch for while using this medication? Visit your doctor or healthcare professional regularly. Tell your doctor or healthcare professional if your symptoms do not start to get better or if theyget worse. You may need blood work done while you are taking this medicine. You may need to follow a special diet. Talk to your doctor. Foods that contain iron include: whole grains/cereals, dried fruits, beans, or peas, leafy greenvegetables, and organ meats (liver, kidney). What side effects may I notice from receiving this medication? Side effects that you should report to  your doctor or health care professionalas soon as possible: allergic reactions like skin rash, itching or hives, swelling of the face, lips, or tongue breathing problems changes in blood pressure cough fast, irregular heartbeat feeling faint or lightheaded, falls fever or chills flushing, sweating, or hot feelings joint or muscle aches/pains seizures swelling of the ankles or feet unusually weak or tired Side effects that usually do not require medical attention (report to yourdoctor or health care professional if they continue or are bothersome): diarrhea feeling achy headache irritation at site where injected nausea, vomiting stomach upset tiredness This list may not describe all possible side effects. Call your doctor for medical advice about side effects. You may report side effects to FDA at1-800-FDA-1088. Where should I keep my medication? This drug is given in a hospital or clinic and will not be stored at home. NOTE: This sheet is a summary. It may not cover all possible information. If you have questions about this medicine, talk to your doctor, pharmacist, orhealth care provider.  2022 Elsevier/Gold Standard (2010-10-05 17:14:35)

## 2020-09-05 NOTE — Progress Notes (Signed)
Follow-up anemia. Other than feeling fatigued, pt has no other issues or concerns at this time.

## 2020-09-06 ENCOUNTER — Encounter: Payer: Self-pay | Admitting: Gastroenterology

## 2020-09-06 ENCOUNTER — Ambulatory Visit (INDEPENDENT_AMBULATORY_CARE_PROVIDER_SITE_OTHER): Payer: 59 | Admitting: Gastroenterology

## 2020-09-06 VITALS — BP 106/72 | HR 72 | Temp 98.3°F | Ht 62.0 in | Wt 198.4 lb

## 2020-09-06 DIAGNOSIS — R1319 Other dysphagia: Secondary | ICD-10-CM

## 2020-09-06 DIAGNOSIS — D508 Other iron deficiency anemias: Secondary | ICD-10-CM | POA: Diagnosis not present

## 2020-09-06 NOTE — Progress Notes (Signed)
Vonda Antigua, MD [address]  [place]  [place], [place] [address]  Main: [phone]  Fax: [phone]   Primary Care Physician: Olin Hauser, DO  Chief complaint: Dysphagia  HPI: Ashley Fields is a 65 y.o. female here for follow-up of dysphagia.  Patient has undergone manometry at [facility] since her last visit and since her EGD.  Report available in Care Everywhere.  Impression:            - Normal esophageal manometry. Median IRP elevated in                         secondary position, though normal in primary and                         without compartmentalization on either. I suspect                         related to hiatal hernia. If concern for outlet                         obstruction, consider timed barium esophagram and/or                         EndoFLIP if clinically warranted.                         - Manometric appearance is consistent with a hiatal                         hernia.  Recommendation:        - Return to ordering provider.   Patient's EGD showed a tortuous esophagus and therefore the above manometry was ordered, that did not show any motility disorders.  EGD biopsies did not show EOE.  Patient feels that her dysphagia symptoms started after her neck surgery and feels that she has to hold her neck, once or twice a day to swallow things.  No weight loss.  No nausea or vomiting.  No episodes of food impaction.  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  Notes reviewed by Dr. Rogue Bussing, and it states that patient is receiving IV iron infusions.  ROS: All ROS reviewed and negative except as per HPI   Past Medical History:  Diagnosis Date   Allergy    Anemia    Anxiety    Arthritis    Yes   Blood transfusion without reported diagnosis 1977   Transfusions from miscarriage & hemorrhaging   Chronic kidney disease    Depression    Disp fx of cuboid bone of  right foot, init for clos fx 01/01/2017   GERD (gastroesophageal reflux disease)    Headache    Hepatitis C    Hyperlipidemia    Neuromuscular disorder ([place]) 1997   Falling to much was an eye-opener   Osteoporosis    Thyroid disease    Urine incontinence     Past Surgical History:  Procedure Laterality Date   5 miscarriages     1977-1985, Blood transfusion s/p miscarriage 1977   ABDOMINAL HYSTERECTOMY  1987   Total   APPENDECTOMY  12/2008   AUGMENTATION MAMMAPLASTY Bilateral 2015   Bilat   AUGMENTATION MAMMAPLASTY  Bilateral 2018   implants redone w/ placement of implants under muscle   breast lift bilateral, implants  0000000   bilateral, silicon naturel   BREAST REDUCTION SURGERY Bilateral 1997   CESAREAN SECTION  07/27/1981   Placenta Previa   CHOLECYSTECTOMY  03/2004   Lap surgery   COLONOSCOPY WITH PROPOFOL N/A 06/13/2020   Procedure: COLONOSCOPY WITH PROPOFOL;  Surgeon: Virgel Manifold, MD;  Location: [facility] ENDOSCOPY;  Service: Endoscopy;  Laterality: N/A;   COSMETIC SURGERY     Breast implants w/ lift, tummy tuck, upper & lower Blepharop   ESOPHAGOGASTRODUODENOSCOPY (EGD) WITH PROPOFOL N/A 06/13/2020   Procedure: ESOPHAGOGASTRODUODENOSCOPY (EGD) WITH PROPOFOL;  Surgeon: Virgel Manifold, MD;  Location: [facility] ENDOSCOPY;  Service: Endoscopy;  Laterality: N/A;   EYE SURGERY  2017   Cataract surgery & lasik   GASTRIC BYPASS  2005   Laparoscopic   IVC FILTER INSERTION  12/2003   TrapEase Vena Cava Filter   LUMBAR LAMINECTOMY  06/2006   L4-L5 (spinal fusion)   mini tummy tuck  05/07/2016   Bilateral bra/back roll lift skin removal   neck surgery C3-C7  02/10/2015   ACDF   REDUCTION MAMMAPLASTY Bilateral 1997   SHOULDER ACROMIOPLASTY Left 03/27/2013   w/ labral debridement   SMALL INTESTINE SURGERY  12/24/2003   Lap Gastric Bypass   SPINAL CORD STIMULATOR IMPLANT  11/2010   removed 05/2011   SPINE SURGERY  2008 2017   Fusion L4-L5, ACDF C4-C7    Prior to  Admission medications   Medication Sig Start Date End Date Taking? Authorizing Provider  ARIPiprazole (ABILIFY) 5 MG tablet Take 1 tablet by mouth daily 08/31/20   Ursula Alert, MD  ASPIRIN LOW DOSE 81 MG EC tablet TAKE 1 TABLET BY MOUTH EVERY DAY 06/04/20   Karamalegos, Devonne Doughty, DO  butalbital-acetaminophen-caffeine (FIORICET) 50-325-40 MG tablet Take 1 tablet by mouth 2 (two) times daily as needed for headache.    [provider]  Cholecalciferol 250 MCG (10000 UT) CAPS Take by mouth.    [provider]  cyanocobalamin (,VITAMIN B-12,) 1000 MCG/ML injection Inject 1,000 mcg into the muscle every 30 (thirty) days. 08/20/19   [provider]  diclofenac Sodium (VOLTAREN) 1 % GEL Apply 2 g topically 4 (four) times daily. 10/07/19   [provider]  estradiol (ESTRACE) 1 MG tablet Take 1 tablet (1 mg total) by mouth daily. 08/18/20 11/16/20  Karamalegos, Devonne Doughty, DO  etodolac (LODINE) 500 MG tablet TAKE 1 TABLET BY MOUTH TWICE A DAY 07/15/19   Karamalegos, Devonne Doughty, DO  ezetimibe (ZETIA) 10 MG tablet TAKE ONE TABLET BY MOUTH ONE TIME DAILY 07/27/20   Parks Ranger, Devonne Doughty, DO  fluticasone [facility]) 50 MCG/ACT nasal spray SPRAY 2 SPRAYS INTO EACH NOSTRIL EVERY DAY 02/18/20   Karamalegos, Devonne Doughty, DO  furosemide (LASIX) 20 MG tablet Take 1 tablet (20 mg total) by mouth daily as needed for fluid or edema. 06/27/20   Minna Merritts, MD  gabapentin (NEURONTIN) 600 MG tablet Take 1 tablet (600 mg total) by mouth 3 (three) times daily. 08/18/20   Karamalegos, Devonne Doughty, DO  hydrOXYzine (ATARAX/VISTARIL) 25 MG tablet TAKE 1/2 TO 1 TABLET BY MOUTH TWICE A DAY AS NEEDED FOR SEVERE PANIC ATTACKS/ ANXIETY 07/21/20   Ursula Alert, MD  lamoTRIgine (LAMICTAL) 25 MG tablet Take 1 tablet (25 mg total) by mouth as directed. Start taking 1 tablet daily morning and 2 tablets daily evening 08/18/20   Ursula Alert, MD  mometasone (  ELOCON) 0.1 % cream Apply 1 application  topically 2 (two) times daily. 05/12/20   [provider]  ondansetron (ZOFRAN ODT) 4 MG disintegrating tablet Take 1 tablet (4 mg total) by mouth every 8 (eight) hours as needed for nausea or vomiting. 12/16/18   Karamalegos, Devonne Doughty, DO  propranolol (INDERAL) 10 MG tablet TAKE ONE TABLET BY MOUTH THREE TIMES DAILY as needed for severe anxiety attacks (limited supply) 08/18/20   Ursula Alert, MD  rosuvastatin (CRESTOR) 40 MG tablet Take 1 tablet (40 mg total) by mouth daily. 06/27/20   Minna Merritts, MD  tiZANidine (ZANAFLEX) 4 MG tablet Take 4 mg by mouth 4 (four) times daily.    [provider]  traMADol (ULTRAM) 50 MG tablet Take 50 mg by mouth 4 (four) times daily. 11/13/19   [provider]  traZODone (DESYREL) 100 MG tablet Take 1 tablet (100 mg total) by mouth at bedtime. 07/28/20   Ursula Alert, MD  venlafaxine XR (EFFEXOR-XR) 75 MG 24 hr capsule TAKE ONE CAPSULE BY MOUTH DAILY with breakfast 06/24/20   Ursula Alert, MD  Shirley Friar 5 % SOLN INSTILL 1 DROP INTO BOTH EYES TWICE A DAY 02/18/19   [provider]    Family History  Problem Relation Age of Onset   COPD Mother    Lung cancer Mother    Diabetes Mother    Heart disease Mother    Stroke Mother    Heart attack Mother    Arthritis Mother    Cancer Mother    Hypertension Mother    Obesity Mother    Varicose Veins Mother    Heart disease Father    Heart attack Father 42   Early death Father    Depression Sister        x 40 sisters   COPD Sister    Hypertension Sister    Heart disease Brother        x 3 brothers   Diabetes Brother    Colon polyps Sister    Hearing loss Sister    Heart attack Sister    Heart attack Brother    Arthritis Sister    Varicose Veins Sister    Arthritis Brother    Heart disease Brother    Hypertension Brother    Cancer Maternal Grandfather    Stroke Maternal Grandfather    COPD Sister    Obesity Sister    Depression Sister    Depression Sister     Heart disease Sister    Hypertension Sister    Diabetes Brother    Heart disease Brother    Hypertension Brother    Obesity Brother    Varicose Veins Brother    Heart disease Brother    Hypertension Brother    Obesity Brother    Hypertension Sister    Obesity Sister    Miscarriages / Stillbirths Maternal Aunt    Miscarriages / Stillbirths Paternal Aunt    Breast cancer Neg Hx      Social History   Tobacco Use   Smoking status: Former    Packs/day: 0.25    Years: 10.00    Pack years: 2.50    Types: Cigarettes    Quit date: 11/29/1995    Years since quitting: 24.7   Smokeless tobacco: Former   Tobacco comments:    Quite 1997, didnt smoke hardly at all when I was smoking.  Vaping Use   Vaping Use: Never used  Substance Use Topics   Alcohol  use: No   Drug use: No    Allergies as of 09/06/2020 - Review Complete 09/06/2020  Allergen Reaction Noted   Flagyl [metronidazole] Anaphylaxis 08/14/2016   Cymbalta [duloxetine hcl]  05/15/2018   Tape Rash 04/17/2017    Physical Examination:  Constitutional: General:   Alert,  Well-developed, well-nourished, pleasant and cooperative in NAD BP 106/72   Pulse 72   Temp 98.3 F (36.8 C) (Oral)   Ht '5\' 2"'$  (1.575 m)   Wt 198 lb 6.4 oz (90 kg)   BMI 36.29 kg/m   Respiratory: Normal respiratory effort  Gastrointestinal:  Soft, non-tender and non-distended without masses, hepatosplenomegaly or hernias noted.  No guarding or rebound tenderness.     Cardiac: No clubbing or edema.  No cyanosis. Normal posterior tibial pedal pulses noted.  Psych:  Alert and cooperative. Normal mood and affect.  Musculoskeletal:  Normal gait. Head normocephalic, atraumatic. Symmetrical without gross deformities. 5/5 Lower extremity strength bilaterally.  Skin: Warm. Intact without significant lesions or rashes. No jaundice.  Neck: Supple, trachea midline  Lymph: No cervical lymphadenopathy  Psych:  Alert and oriented x3, Alert and  cooperative. Normal mood and affect.  Labs: CMP     Component Value Date/Time   NA 139 09/02/2020 1050   NA 139 08/14/2016 1508   K 4.2 09/02/2020 1050   CL 103 09/02/2020 1050   CO2 29 09/02/2020 1050   GLUCOSE 89 09/02/2020 1050   BUN 14 09/02/2020 1050   BUN 17 08/14/2016 1508   CREATININE 0.78 09/02/2020 1050   CREATININE 0.96 03/30/2020 0802   CALCIUM 9.2 09/02/2020 1050   PROT 6.8 03/30/2020 0802   PROT 7.5 08/14/2016 1508   ALBUMIN 4.9 (H) 08/14/2016 1508   AST 26 03/30/2020 0802   ALT 23 03/30/2020 0802   ALKPHOS 103 08/14/2016 1508   BILITOT 0.8 03/30/2020 0802   BILITOT 0.2 08/14/2016 1508   GFRNONAA >60 09/02/2020 1050   GFRNONAA 63 03/30/2020 0802   GFRAA 73 03/30/2020 0802   Lab Results  Component Value Date   WBC 5.2 09/02/2020   HGB 13.1 09/02/2020   HCT 40.9 09/02/2020   MCV 94.5 09/02/2020   PLT 152 09/02/2020   Iron 100 Saturation 23 Ferritin 15 Hemoglobin 13.1  Imaging Studies:   Assessment and Plan:   Ashley Fields is a 65 y.o. y/o female here for follow-up of dysphagia  Patient may have underlying oropharyngeal dysphagia given that symptoms started after her neck surgery  Patient agreeable to evaluation by speech and swallow pathology.  If this is negative, consider upper GI study  Continue follow-up with hematology for IV iron infusions, most recent labs show improvement in ferritin at 15, compared to 7 in 2020.  Hemoglobin is normal.  Small bowel capsule study not indicated at this time given normal hemoglobin and iron deficiency likely due to her gastric bypass status  Dr Vonda Antigua

## 2020-09-08 ENCOUNTER — Other Ambulatory Visit: Payer: Self-pay

## 2020-09-08 ENCOUNTER — Other Ambulatory Visit: Payer: Self-pay | Admitting: Psychiatry

## 2020-09-08 ENCOUNTER — Telehealth (INDEPENDENT_AMBULATORY_CARE_PROVIDER_SITE_OTHER): Payer: 59 | Admitting: Psychiatry

## 2020-09-08 ENCOUNTER — Encounter: Payer: Self-pay | Admitting: Family Medicine

## 2020-09-08 ENCOUNTER — Encounter: Payer: Self-pay | Admitting: Psychiatry

## 2020-09-08 ENCOUNTER — Other Ambulatory Visit: Payer: Self-pay | Admitting: Cardiovascular Disease

## 2020-09-08 DIAGNOSIS — F3161 Bipolar disorder, current episode mixed, mild: Secondary | ICD-10-CM | POA: Diagnosis not present

## 2020-09-08 DIAGNOSIS — F4310 Post-traumatic stress disorder, unspecified: Secondary | ICD-10-CM

## 2020-09-08 DIAGNOSIS — R635 Abnormal weight gain: Secondary | ICD-10-CM

## 2020-09-08 DIAGNOSIS — F410 Panic disorder [episodic paroxysmal anxiety] without agoraphobia: Secondary | ICD-10-CM | POA: Diagnosis not present

## 2020-09-08 DIAGNOSIS — G4701 Insomnia due to medical condition: Secondary | ICD-10-CM | POA: Diagnosis not present

## 2020-09-08 DIAGNOSIS — E782 Mixed hyperlipidemia: Secondary | ICD-10-CM

## 2020-09-08 DIAGNOSIS — R609 Edema, unspecified: Secondary | ICD-10-CM

## 2020-09-08 MED ORDER — LAMOTRIGINE 100 MG PO TABS: 50.0000 mg | ORAL_TABLET | Freq: Two times a day (BID) | ORAL | 0 refills | Status: DC

## 2020-09-08 NOTE — Progress Notes (Signed)
Virtual Visit via Video Note  I connected with Ashley Fields on 09/08/20 at 10:00 AM EDT by a video enabled telemedicine application and verified that I am speaking with the correct person using two identifiers.  Location Provider Location : [facility] Patient Location : Home  Participants: Patient , Provider    I discussed the limitations of evaluation and management by telemedicine and the availability of in person appointments. The patient expressed understanding and agreed to proceed.   I discussed the assessment and treatment plan with the patient. The patient was provided an opportunity to ask questions and all were answered. The patient agreed with the plan and demonstrated an understanding of the instructions.   The patient was advised to call back or seek an in-person evaluation if the symptoms worsen or if the condition fails to improve as anticipated.   [place] MD OP Progress Note  09/08/2020 10:21 AM Ashley Fields  MRN:  [id number]  Chief Complaint:  Chief Complaint   Follow-up; Depression; Anxiety    HPI: Ashley Fields is a 65 year old Caucasian female, married, on disability, lives in [facility], has a history of bipolar disorder, PTSD, chronic pain, migraine headache, history of laparoscopic gastric bypass was evaluated by telemedicine today.  Patient today reports she continues to have days when she feels sluggish, tired.  This morning she had a burst of energy and was able to do a lot of activities around the house however that does not happen all the time.  Patient reports in spite of sleeping through the night she continues to feel like she needs a nap during the day.  This has been going on for a while.  She has never had a sleep study done before.  Patient with dysphagia, recently underwent EGD, manometry and per review of notes per Dr.Tahiliani -patient likely with oropharyngeal dysphagia after her neck surgery.  Evaluation by speech and swallow pathology recommended.  If this is  negative may need upper GI study.'  Patient reports her dysphagia is improving however she has to be very careful about how she eats and has to drink lots of fluids.  She reports appetite is fair.  She is compliant on medications.  She is concerned about weight gain, likely multifactorial.  She is aware about lifestyle changes that she needs to make, provided education about calorie counting, dietary management.  Patient denies any suicidality, homicidality or perceptual disturbances.  Patient denies any other concerns today.    Visit Diagnosis:    ICD-10-CM   1. Bipolar 1 disorder, mixed, mild (HCC)  F31.61 lamoTRIgine (LAMICTAL) 100 MG tablet    2. PTSD (post-traumatic stress disorder)  F43.10 lamoTRIgine (LAMICTAL) 100 MG tablet    3. Panic disorder  F41.0     4. Insomnia due to medical condition  G47.01    pain, mood      Past Psychiatric History: Reviewed past psychiatric history from progress note on 05/15/2018.  Past trials of Prozac, Wellbutrin, Lexapro, Cymbalta, Rexulti, Ambien-nightmares, Lunesta  Past Medical History:  Past Medical History:  Diagnosis Date   Allergy    Anemia    Anxiety    Arthritis    Yes   Blood transfusion without reported diagnosis 1977   Transfusions from miscarriage & hemorrhaging   Chronic kidney disease    Depression    Disp fx of cuboid bone of right foot, init for clos fx 01/01/2017   GERD (gastroesophageal reflux disease)    Headache    Hepatitis C  Hyperlipidemia    Neuromuscular disorder ([place]) 1997   Falling to much was an eye-opener   Osteoporosis    Thyroid disease    Urine incontinence     Past Surgical History:  Procedure Laterality Date   5 miscarriages     1977-1985, Blood transfusion s/p miscarriage [place]   Total   APPENDECTOMY  12/2008   AUGMENTATION MAMMAPLASTY Bilateral 2015   Bilat   AUGMENTATION MAMMAPLASTY Bilateral 2018   implants redone w/ placement of implants under  muscle   breast lift bilateral, implants  0000000   bilateral, silicon naturel   BREAST REDUCTION SURGERY Bilateral 1997   CESAREAN SECTION  07/27/1981   Placenta Previa   CHOLECYSTECTOMY  03/2004   Lap surgery   COLONOSCOPY WITH PROPOFOL N/A 06/13/2020   Procedure: COLONOSCOPY WITH PROPOFOL;  Surgeon: Virgel Manifold, MD;  Location: [facility] ENDOSCOPY;  Service: Endoscopy;  Laterality: N/A;   COSMETIC SURGERY     Breast implants w/ lift, tummy tuck, upper & lower Blepharop   ESOPHAGOGASTRODUODENOSCOPY (EGD) WITH PROPOFOL N/A 06/13/2020   Procedure: ESOPHAGOGASTRODUODENOSCOPY (EGD) WITH PROPOFOL;  Surgeon: Virgel Manifold, MD;  Location: [facility] ENDOSCOPY;  Service: Endoscopy;  Laterality: N/A;   EYE SURGERY  2017   Cataract surgery & lasik   GASTRIC BYPASS  2005   Laparoscopic   IVC FILTER INSERTION  12/2003   TrapEase Vena Cava Filter   LUMBAR LAMINECTOMY  06/2006   L4-L5 (spinal fusion)   mini tummy tuck  05/07/2016   Bilateral bra/back roll lift skin removal   neck surgery C3-C7  02/10/2015   ACDF   REDUCTION MAMMAPLASTY Bilateral 1997   SHOULDER ACROMIOPLASTY Left 03/27/2013   w/ labral debridement   SMALL INTESTINE SURGERY  12/24/2003   Lap Gastric Bypass   SPINAL CORD STIMULATOR IMPLANT  11/2010   removed 05/2011   SPINE SURGERY  2008 2017   Fusion L4-L5, ACDF C4-C7    Family Psychiatric History: Reviewed family psychiatric history from progress note on 05/15/2018  Family History:  Family History  Problem Relation Age of Onset   COPD Mother    Lung cancer Mother    Diabetes Mother    Heart disease Mother    Stroke Mother    Heart attack Mother    Arthritis Mother    Cancer Mother    Hypertension Mother    Obesity Mother    Varicose Veins Mother    Heart disease Father    Heart attack Father 54   Early death Father    Depression Sister        x 5 sisters   COPD Sister    Hypertension Sister    Heart disease Brother        x 3 brothers   Diabetes  Brother    Colon polyps Sister    Hearing loss Sister    Heart attack Sister    Heart attack Brother    Arthritis Sister    Varicose Veins Sister    Arthritis Brother    Heart disease Brother    Hypertension Brother    Cancer Maternal Grandfather    Stroke Maternal Grandfather    COPD Sister    Obesity Sister    Depression Sister    Depression Sister    Heart disease Sister    Hypertension Sister    Diabetes Brother    Heart disease Brother    Hypertension Brother    Obesity Brother  Varicose Veins Brother    Heart disease Brother    Hypertension Brother    Obesity Brother    Hypertension Sister    Obesity Sister    Miscarriages / Stillbirths Maternal Aunt    Miscarriages / Stillbirths Paternal Aunt    Breast cancer Neg Hx     Social History: Reviewed social history from progress note on 05/15/2018 Social History   Socioeconomic History   Marital status: Married    Spouse name: Not on file   Number of children: 1   Years of education: High School   Highest education level: Some college, no degree  Occupational History   Occupation: disability  Tobacco Use   Smoking status: Former    Packs/day: 0.25    Years: 10.00    Pack years: 2.50    Types: Cigarettes    Quit date: 11/29/1995    Years since quitting: 24.7   Smokeless tobacco: Former   Tobacco comments:    Quite 1997, didnt smoke hardly at all when I was smoking.  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]   Social Determinants of Radio broadcast assistant Strain: Low Risk    Difficulty of Paying Living Expenses: Not hard at all  Food Insecurity:  No Food Insecurity   Worried About Charity fundraiser in the Last Year: Never true   Arboriculturist in the Last Year: Never true  Transportation Needs: No Transportation Needs   Lack of Transportation (Medical): No   Lack of Transportation (Non-Medical): No  Physical Activity: Inactive   Days of Exercise per Week: 0 days   Minutes of Exercise per Session: 0 min  Stress: Stress Concern Present   Feeling of Stress : Very much  Social Connections: Not on file    Allergies:  Allergies  Allergen Reactions   Flagyl [Metronidazole] Anaphylaxis   Cymbalta [Duloxetine Hcl]    Tape Rash    Metabolic Disorder Labs: Lab Results  Component Value Date   HGBA1C 5.0 03/30/2020   MPG 97 03/30/2020   MPG 97 03/17/2019   No results found for: PROLACTIN Lab Results  Component Value Date   CHOL 152 03/30/2020   TRIG 67 03/30/2020   HDL 89 03/30/2020   CHOLHDL 1.7 03/30/2020   LDLCALC 49 03/30/2020   LDLCALC 42 09/22/2019   Lab Results  Component Value Date   TSH 1.97 03/30/2020   TSH 0.86 08/11/2018    Therapeutic Level Labs: No results found for: LITHIUM No results found for: VALPROATE No components found for:  CBMZ  Current Medications: Current Outpatient Medications  Medication Sig Dispense Refill   lamoTRIgine (LAMICTAL) 100 MG tablet Take 0.5 tablets (50 mg total) by mouth 2 (two) times daily. 90 tablet 0   ARIPiprazole (ABILIFY) 5 MG tablet Take 1 tablet by mouth daily 90 tablet 0   ASPIRIN LOW DOSE 81 MG EC tablet TAKE 1 TABLET BY MOUTH EVERY DAY 90 tablet 3   butalbital-acetaminophen-caffeine (FIORICET) 50-325-40 MG tablet Take 1 tablet by mouth 2 (two) times daily as needed for headache.     Cholecalciferol 250  MCG (10000 UT) CAPS Take by mouth.     cyanocobalamin (,VITAMIN B-12,) 1000 MCG/ML injection Inject 1,000 mcg into the muscle every 30 (thirty) days.     diclofenac Sodium (VOLTAREN) 1 % GEL Apply 2 g topically 4 (four) times daily.     estradiol (ESTRACE) 1  MG tablet Take 1 tablet (1 mg total) by mouth daily. 90 tablet 0   etodolac (LODINE) 500 MG tablet TAKE 1 TABLET BY MOUTH TWICE A DAY 180 tablet 1   ezetimibe (ZETIA) 10 MG tablet TAKE ONE TABLET BY MOUTH ONE TIME DAILY 90 tablet 1   fluticasone (FLONASE) 50 MCG/ACT nasal spray SPRAY 2 SPRAYS INTO EACH NOSTRIL EVERY DAY 48 mL 3   furosemide (LASIX) 20 MG tablet TAKE 1 TABLET EVERY DAY FOR FLUID OR EDEMA AS NEEDED 90 tablet 0   gabapentin (NEURONTIN) 600 MG tablet Take 1 tablet (600 mg total) by mouth 3 (three) times daily. 90 tablet 3   hydrOXYzine (ATARAX/VISTARIL) 25 MG tablet TAKE 1/2 TO 1 TABLET BY MOUTH TWICE A DAY AS NEEDED FOR SEVERE PANIC ATTACKS/ ANXIETY 180 tablet 1   LORazepam (ATIVAN) 0.5 MG tablet Ativan 0.5 mg tablet  1 tab - 40 min before injection  bring with you on the day of the injection and take it 40 min prior to the procedure     mometasone (ELOCON) 0.1 % cream Apply 1 application topically 2 (two) times daily.     ondansetron (ZOFRAN ODT) 4 MG disintegrating tablet Take 1 tablet (4 mg total) by mouth every 8 (eight) hours as needed for nausea or vomiting. 30 tablet 2   propranolol (INDERAL) 10 MG tablet TAKE ONE TABLET BY MOUTH THREE TIMES DAILY as needed for severe anxiety attacks (limited supply) 90 tablet 0   rosuvastatin (CRESTOR) 40 MG tablet TAKE 1 TABLET EVERY DAY 90 tablet 1   tiZANidine (ZANAFLEX) 4 MG tablet Take 4 mg by mouth 4 (four) times daily.     traMADol (ULTRAM) 50 MG tablet Take 50 mg by mouth 4 (four) times daily.     traZODone (DESYREL) 100 MG tablet Take 1 tablet (100 mg total) by mouth at bedtime. 90 tablet 0   venlafaxine XR (EFFEXOR-XR) 75 MG 24 hr capsule TAKE ONE CAPSULE BY MOUTH DAILY with breakfast 90 capsule 1   XIIDRA 5 % SOLN INSTILL 1 DROP INTO BOTH EYES TWICE A DAY     No current facility-administered medications for this visit.     Musculoskeletal: Strength & Muscle Tone:  UTA Gait & Station:  Seated Patient leans:  N/A  Psychiatric Specialty Exam: Review of Systems  Constitutional:  Positive for fatigue.  Psychiatric/Behavioral:  Positive for dysphoric mood.   All other systems reviewed and are negative.  There were no vitals taken for this visit.There is no height or weight on file to calculate BMI.  General Appearance: Casual  Eye Contact:  Fair  Speech:  Clear and Coherent  Volume:  Normal  Mood:  Dysphoric  Affect:  Congruent  Thought Process:  Goal Directed and Descriptions of Associations: Intact  Orientation:  Full (Time, Place, and Person)  Thought Content: Logical   Suicidal Thoughts:  No  Homicidal Thoughts:  No  Memory:  Immediate;   Fair Recent;   Fair Remote;   Fair  Judgement:  Fair  Insight:  Fair  Psychomotor Activity:  Normal  Concentration:  Concentration: Fair and Attention Span: Fair  Recall:  [employer] of Knowledge: Fair  Language:  Fair  Akathisia:  No  Handed:  Right  AIMS (if indicated): done  Assets:  Communication Skills Desire for Improvement Housing Social Support  ADL's:  Intact  Cognition: WNL  Sleep:  Fair   Screenings: GAD-7    [place] Office Visit from 12/28/2019 in [facility] Office Visit from 03/17/2019 in [facility] Office Visit from 11/20/2018 in [facility] Office Visit from 08/18/2018 in [facility] Office Visit from 07/22/2018 in [facility]  Total GAD-7 Score '2 2 4 9 8      '$ PHQ2-9    Flowsheet Row Video Visit from 08/02/2020 in [place] Video Visit from 07/18/2020 in [place] Video Visit from 04/25/2020 in [place] from 03/29/2020 in [facility] Office Visit from 12/28/2019 in [place]  PHQ-2 Total Score '1 6 2 4 '$ 0  PHQ-9 Total Score '10 21 5 9 1      '$ Flowsheet Row Video Visit from 08/02/2020 in [place] Admission (Discharged) from 06/13/2020 in [place] No Risk No Risk        Assessment and Plan: Ashley Fields is a 65 year old Caucasian female who has a history of PTSD, bipolar disorder, panic disorder, migraine headaches, chronic pain was evaluated by telemedicine today.  Patient with possible fatigue, low mood during the day, possible excessive sleepiness however this needs to be explored more.  She will benefit from the following plan.  Plan Bipolar disorder mixed mild-unstable Increase lamotrigine to 100 mg p.o. daily divided dosage Abilify 5 mg p.o. daily Gabapentin 600 mg p.o. 3 times daily.  Prescribed by primary care provider.  Discussed changing the time of her medications like gabapentin towards the end of the day to address her sluggishness.  She is also on other medications like muscle relaxants which she takes 4 times a day which could also contribute to adverse side effects of fatigue, sluggishness during the day. Venlafaxine extended release 75 mg daily  PTSD-stable Venlafaxine as prescribed Hydroxyzine 25 mg p.o. daily as needed for severe anxiety attacks  Panic disorder-stable Venlafaxine as prescribed  Insomnia-stable Trazodone 50 to 100 mg p.o. nightly as needed Melatonin 10 mg p.o. nightly Patient to explore possibility of sleep study since she does report feeling sluggish, unrested in spite of having a good sleep at night.  This is happening on a regular basis.  She also reports taking naps during the day.  She will consider it.  Weight gain likely secondary to medications-unstable Provided supportive therapy, discussed lifestyle changes, dietary management for weight gain side effects of medications.  Follow-up in clinic in 2 weeks or sooner if needed.  This note was generated in part or whole with voice recognition software. Voice recognition is usually quite accurate but there  are transcription errors that can and very often do occur. I apologize for any typographical errors that were not detected and corrected.     Ursula Alert, MD 09/08/2020, 10:21 AM

## 2020-09-10 ENCOUNTER — Other Ambulatory Visit: Payer: Self-pay | Admitting: Psychiatry

## 2020-09-10 ENCOUNTER — Other Ambulatory Visit: Payer: Self-pay | Admitting: Family Medicine

## 2020-09-10 DIAGNOSIS — Z8249 Family history of ischemic heart disease and other diseases of the circulatory system: Secondary | ICD-10-CM

## 2020-09-10 DIAGNOSIS — F3176 Bipolar disorder, in full remission, most recent episode depressed: Secondary | ICD-10-CM

## 2020-09-10 DIAGNOSIS — F4310 Post-traumatic stress disorder, unspecified: Secondary | ICD-10-CM

## 2020-09-10 DIAGNOSIS — I25118 Atherosclerotic heart disease of native coronary artery with other forms of angina pectoris: Secondary | ICD-10-CM

## 2020-09-10 DIAGNOSIS — F410 Panic disorder [episodic paroxysmal anxiety] without agoraphobia: Secondary | ICD-10-CM

## 2020-09-11 NOTE — Telephone Encounter (Signed)
dc'd 09/06/20 Pt preference Ashley Fields CMA

## 2020-09-12 ENCOUNTER — Other Ambulatory Visit: Payer: Self-pay | Admitting: Family Medicine

## 2020-09-12 DIAGNOSIS — I25118 Atherosclerotic heart disease of native coronary artery with other forms of angina pectoris: Secondary | ICD-10-CM

## 2020-09-12 DIAGNOSIS — Z8249 Family history of ischemic heart disease and other diseases of the circulatory system: Secondary | ICD-10-CM

## 2020-09-14 ENCOUNTER — Other Ambulatory Visit: Payer: Self-pay | Admitting: Family Medicine

## 2020-09-14 DIAGNOSIS — Z8249 Family history of ischemic heart disease and other diseases of the circulatory system: Secondary | ICD-10-CM

## 2020-09-14 DIAGNOSIS — I25118 Atherosclerotic heart disease of native coronary artery with other forms of angina pectoris: Secondary | ICD-10-CM

## 2020-09-15 ENCOUNTER — Other Ambulatory Visit: Payer: Self-pay | Admitting: Family Medicine

## 2020-09-15 DIAGNOSIS — Z8249 Family history of ischemic heart disease and other diseases of the circulatory system: Secondary | ICD-10-CM

## 2020-09-15 DIAGNOSIS — I25118 Atherosclerotic heart disease of native coronary artery with other forms of angina pectoris: Secondary | ICD-10-CM

## 2020-09-15 NOTE — Telephone Encounter (Signed)
Medication discontinued 09/06/20

## 2020-09-16 ENCOUNTER — Other Ambulatory Visit: Payer: Self-pay | Admitting: Family Medicine

## 2020-09-16 DIAGNOSIS — I25118 Atherosclerotic heart disease of native coronary artery with other forms of angina pectoris: Secondary | ICD-10-CM

## 2020-09-16 DIAGNOSIS — Z8249 Family history of ischemic heart disease and other diseases of the circulatory system: Secondary | ICD-10-CM

## 2020-09-16 NOTE — Telephone Encounter (Signed)
Requested medications are due for refill today.  unknown  Requested medications are on the active medications list.  no  Last refill. 06/09/2020  Future visit scheduled.   yes  Notes to clinic.  Per Rx request this medication was discontinued.

## 2020-09-19 NOTE — Telephone Encounter (Signed)
Can you contact patient to clarify if she is still taking Ozempic or not? I see this order was denied like 5 times by med refill team.  It's off her list. I am not sure if she still has it or resumed or wants to resume it.   Nobie Putnam, [facility] 09/19/2020, 5:45 PM

## 2020-09-22 ENCOUNTER — Telehealth (INDEPENDENT_AMBULATORY_CARE_PROVIDER_SITE_OTHER): Payer: 59 | Admitting: Psychiatry

## 2020-09-22 ENCOUNTER — Encounter: Payer: Self-pay | Admitting: Family Medicine

## 2020-09-22 ENCOUNTER — Other Ambulatory Visit: Payer: Self-pay

## 2020-09-22 ENCOUNTER — Encounter: Payer: Self-pay | Admitting: Psychiatry

## 2020-09-22 DIAGNOSIS — F410 Panic disorder [episodic paroxysmal anxiety] without agoraphobia: Secondary | ICD-10-CM | POA: Diagnosis not present

## 2020-09-22 DIAGNOSIS — G4701 Insomnia due to medical condition: Secondary | ICD-10-CM

## 2020-09-22 DIAGNOSIS — F4310 Post-traumatic stress disorder, unspecified: Secondary | ICD-10-CM

## 2020-09-22 DIAGNOSIS — F3161 Bipolar disorder, current episode mixed, mild: Secondary | ICD-10-CM | POA: Diagnosis not present

## 2020-09-22 DIAGNOSIS — R635 Abnormal weight gain: Secondary | ICD-10-CM

## 2020-09-22 MED ORDER — LAMOTRIGINE 25 MG PO TABS
25.0000 mg | ORAL_TABLET | Freq: Every day | ORAL | 0 refills | Status: DC
Start: 2020-09-22 — End: 2020-10-06

## 2020-09-22 MED ORDER — ARIPIPRAZOLE 5 MG PO TABS: 2.5000 mg | ORAL_TABLET | Freq: Every day | ORAL | 0 refills | Status: DC

## 2020-09-22 NOTE — Progress Notes (Signed)
Virtual Visit via Video Note  I connected with [name] on 09/22/20 at  4:20 PM EDT by a video enabled telemedicine application and verified that I am speaking with the correct person using two identifiers.  Location Provider Location : [facility] Patient Location : Home  Participants: Patient , Provider    I discussed the limitations of evaluation and management by telemedicine and the availability of in person appointments. The patient expressed understanding and agreed to proceed.    I discussed the assessment and treatment plan with the patient. The patient was provided an opportunity to ask questions and all were answered. The patient agreed with the plan and demonstrated an understanding of the instructions.   The patient was advised to call back or seek an in-person evaluation if the symptoms worsen or if the condition fails to improve as anticipated.   [place] MD OP Progress Note  09/22/2020 4:43 PM [name]  MRN:  [id number]  Chief Complaint:  Chief Complaint   Follow-up; Depression    HPI: Ashley Fields is a 65 year old Caucasian female, married, on disability, lives in [facility], has a history of bipolar disorder, PTSD, chronic pain, migraine headache, history of laparoscopic gastric bypass was evaluated by telemedicine today.  Patient reports she continues to have mood swings.  She reports when she has mood swings or irritability it is usually triggered by a stressful situation and it does not last too long.  She reports she is able to cope with it if she can distract herself or walks away from the situation.  Patient reports she is tolerating the higher dosage of Lamictal.  Denies side effects.  She reports she is worried about weight gain side effects of Abilify.  Wonders if she could come off of it.  Patient continues to have dysphagia, currently scheduled for barium swallow.  Patient also is scheduled for speech therapy.  She continues to take time to eat her meals.  She  reports she is eating enough for herself.  Patient denies any suicidality, homicidality or perceptual disturbances.  Patient denies any other concerns today.    Visit Diagnosis:    ICD-10-CM   1. Bipolar 1 disorder, mixed, mild (HCC)  F31.61 lamoTRIgine (LAMICTAL) 25 MG tablet    ARIPiprazole (ABILIFY) 5 MG tablet    2. PTSD (post-traumatic stress disorder)  F43.10     3. Panic disorder  F41.0     4. Insomnia due to medical condition  G47.01    pain, mood    5. Weight gain  R63.5    likely secondary to psychotropics      Past Psychiatric History: Reviewed past psychiatric history from progress note on 05/15/2018.  Past trials of Prozac, Wellbutrin, Lexapro, Cymbalta, Rexulti, Ambien-nightmares, Lunesta  Past Medical History:  Past Medical History:  Diagnosis Date   Allergy    Anemia    Anxiety    Arthritis    Yes   Blood transfusion without reported diagnosis 1977   Transfusions from miscarriage & hemorrhaging   Chronic kidney disease    Depression    Disp fx of cuboid bone of right foot, init for clos fx 01/01/2017   GERD (gastroesophageal reflux disease)    Headache    Hepatitis C    Hyperlipidemia    Neuromuscular disorder ([place]) 1997   Falling to much was an eye-opener   Osteoporosis    Thyroid disease    Urine incontinence     Past Surgical History:  Procedure Laterality Date  5 miscarriages     1977-1985, Blood transfusion s/p miscarriage [place]   Total   APPENDECTOMY  12/2008   AUGMENTATION MAMMAPLASTY Bilateral 2015   Bilat   AUGMENTATION MAMMAPLASTY Bilateral 2018   implants redone w/ placement of implants under muscle   breast lift bilateral, implants  0000000   bilateral, silicon naturel   BREAST REDUCTION SURGERY Bilateral 1997   CESAREAN SECTION  07/27/1981   Placenta Previa   CHOLECYSTECTOMY  03/2004   Lap surgery   COLONOSCOPY WITH PROPOFOL N/A 06/13/2020   Procedure: COLONOSCOPY WITH PROPOFOL;  Surgeon:  Virgel Manifold, MD;  Location: [facility] ENDOSCOPY;  Service: Endoscopy;  Laterality: N/A;   COSMETIC SURGERY     Breast implants w/ lift, tummy tuck, upper & lower Blepharop   ESOPHAGOGASTRODUODENOSCOPY (EGD) WITH PROPOFOL N/A 06/13/2020   Procedure: ESOPHAGOGASTRODUODENOSCOPY (EGD) WITH PROPOFOL;  Surgeon: Virgel Manifold, MD;  Location: [facility] ENDOSCOPY;  Service: Endoscopy;  Laterality: N/A;   EYE SURGERY  2017   Cataract surgery & lasik   GASTRIC BYPASS  2005   Laparoscopic   IVC FILTER INSERTION  12/2003   TrapEase Vena Cava Filter   LUMBAR LAMINECTOMY  06/2006   L4-L5 (spinal fusion)   mini tummy tuck  05/07/2016   Bilateral bra/back roll lift skin removal   neck surgery C3-C7  02/10/2015   ACDF   REDUCTION MAMMAPLASTY Bilateral 1997   SHOULDER ACROMIOPLASTY Left 03/27/2013   w/ labral debridement   SMALL INTESTINE SURGERY  12/24/2003   Lap Gastric Bypass   SPINAL CORD STIMULATOR IMPLANT  11/2010   removed 05/2011   SPINE SURGERY  2008 2017   Fusion L4-L5, ACDF C4-C7    Family Psychiatric History: Reviewed family psychiatric history from progress note on 05/15/2018  Family History:  Family History  Problem Relation Age of Onset   COPD Mother    Lung cancer Mother    Diabetes Mother    Heart disease Mother    Stroke Mother    Heart attack Mother    Arthritis Mother    Cancer Mother    Hypertension Mother    Obesity Mother    Varicose Veins Mother    Heart disease Father    Heart attack Father 60   Early death Father    Depression Sister        x 33 sisters   COPD Sister    Hypertension Sister    Heart disease Brother        x 3 brothers   Diabetes Brother    Colon polyps Sister    Hearing loss Sister    Heart attack Sister    Heart attack Brother    Arthritis Sister    Varicose Veins Sister    Arthritis Brother    Heart disease Brother    Hypertension Brother    Cancer Maternal Grandfather    Stroke Maternal Grandfather    COPD Sister    Obesity  Sister    Depression Sister    Depression Sister    Heart disease Sister    Hypertension Sister    Diabetes Brother    Heart disease Brother    Hypertension Brother    Obesity Brother    Varicose Veins Brother    Heart disease Brother    Hypertension Brother    Obesity Brother    Hypertension Sister    Obesity Sister    Miscarriages / Stillbirths Maternal Aunt    Miscarriages /  Stillbirths Paternal Aunt    Breast cancer Neg Hx     Social History: Reviewed social history from progress note on 05/15/2018 Social History   Socioeconomic History   Marital status: Married    Spouse name: Not on file   Number of children: 1   Years of education: High School   Highest education level: Some college, no degree  Occupational History   Occupation: disability  Tobacco Use   Smoking status: Former    Packs/day: 0.25    Years: 10.00    Pack years: 2.50    Types: Cigarettes    Quit date: 11/29/1995    Years since quitting: 24.8   Smokeless tobacco: Former   Tobacco comments:    Quite 1997, didnt smoke hardly at all when I was smoking.  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]   Social Determinants of Radio broadcast assistant Strain: Low Risk    Difficulty of Paying Living Expenses: Not hard at all  Food Insecurity: No Food Insecurity   Worried About Charity fundraiser in the Last Year: Never true   Arboriculturist in the Last Year: Never true  Transportation Needs: No Transportation Needs   Lack of Transportation (Medical): No   Lack of Transportation (Non-Medical): No  Physical Activity: Inactive   Days of Exercise per  Week: 0 days   Minutes of Exercise per Session: 0 min  Stress: Stress Concern Present   Feeling of Stress : Very much  Social Connections: Not on file    Allergies:  Allergies  Allergen Reactions   Flagyl [Metronidazole] Anaphylaxis   Cymbalta [Duloxetine Hcl]    Tape Rash    Metabolic Disorder Labs: Lab Results  Component Value Date   HGBA1C 5.0 03/30/2020   MPG 97 03/30/2020   MPG 97 03/17/2019   No results found for: PROLACTIN Lab Results  Component Value Date   CHOL 152 03/30/2020   TRIG 67 03/30/2020   HDL 89 03/30/2020   CHOLHDL 1.7 03/30/2020   LDLCALC 49 03/30/2020   LDLCALC 42 09/22/2019   Lab Results  Component Value Date   TSH 1.97 03/30/2020   TSH 0.86 08/11/2018    Therapeutic Level Labs: No results found for: LITHIUM No results found for: VALPROATE No components found for:  CBMZ  Current Medications: Current Outpatient Medications  Medication Sig Dispense Refill   lamoTRIgine (LAMICTAL) 25 MG tablet Take 1 tablet (25 mg total) by mouth daily. Take along with 100 mg daily , total of 125 mg 90 tablet 0   ARIPiprazole (ABILIFY) 5 MG tablet Take 0.5 tablets (2.5 mg total) by mouth daily. 90 tablet 0   ASPIRIN LOW DOSE 81 MG EC tablet TAKE 1 TABLET BY MOUTH EVERY DAY 90 tablet 3   butalbital-acetaminophen-caffeine (FIORICET) 50-325-40 MG tablet Take 1 tablet by mouth 2 (two) times daily as needed for headache.     Cholecalciferol 250 MCG (10000 UT) CAPS Take by mouth.     cyanocobalamin (,VITAMIN B-12,) 1000 MCG/ML injection Inject 1,000 mcg into the muscle every 30 (thirty) days.  diclofenac Sodium (VOLTAREN) 1 % GEL Apply 2 g topically 4 (four) times daily.     estradiol (ESTRACE) 1 MG tablet Take 1 tablet (1 mg total) by mouth daily. 90 tablet 0   etodolac (LODINE) 500 MG tablet TAKE 1 TABLET BY MOUTH TWICE A DAY 180 tablet 1   ezetimibe (ZETIA) 10 MG tablet TAKE ONE TABLET BY MOUTH ONE TIME DAILY 90 tablet 1   fluticasone (FLONASE) 50 MCG/ACT  nasal spray SPRAY 2 SPRAYS INTO EACH NOSTRIL EVERY DAY 48 mL 3   furosemide (LASIX) 20 MG tablet TAKE 1 TABLET EVERY DAY FOR FLUID OR EDEMA AS NEEDED 90 tablet 0   gabapentin (NEURONTIN) 600 MG tablet Take 1 tablet (600 mg total) by mouth 3 (three) times daily. 90 tablet 3   hydrOXYzine (ATARAX/VISTARIL) 25 MG tablet TAKE 1/2 TO 1 TABLET BY MOUTH TWICE A DAY AS NEEDED FOR SEVERE PANIC ATTACKS/ ANXIETY 180 tablet 1   lamoTRIgine (LAMICTAL) 100 MG tablet Take 0.5 tablets (50 mg total) by mouth 2 (two) times daily. 90 tablet 0   LORazepam (ATIVAN) 0.5 MG tablet Ativan 0.5 mg tablet  1 tab - 40 min before injection  bring with you on the day of the injection and take it 40 min prior to the procedure     mometasone (ELOCON) 0.1 % cream Apply 1 application topically 2 (two) times daily.     ondansetron (ZOFRAN ODT) 4 MG disintegrating tablet Take 1 tablet (4 mg total) by mouth every 8 (eight) hours as needed for nausea or vomiting. 30 tablet 2   OZEMPIC, 1 MG/DOSE, 4 MG/3ML SOPN INJECT '1MG'$  INTO THE SKIN ONCE A WEEK 3 mL 2   propranolol (INDERAL) 10 MG tablet TAKE ONE TABLET BY MOUTH THREE TIMES DAILY as needed for severe anxiety attacks (limited supply) 90 tablet 0   rosuvastatin (CRESTOR) 40 MG tablet TAKE 1 TABLET EVERY DAY 90 tablet 1   tiZANidine (ZANAFLEX) 4 MG tablet Take 4 mg by mouth 4 (four) times daily.     traMADol (ULTRAM) 50 MG tablet Take 50 mg by mouth 4 (four) times daily.     traZODone (DESYREL) 100 MG tablet Take 1 tablet (100 mg total) by mouth at bedtime. 90 tablet 0   venlafaxine XR (EFFEXOR-XR) 75 MG 24 hr capsule TAKE ONE CAPSULE BY MOUTH DAILY with breakfast 90 capsule 1   XIIDRA 5 % SOLN INSTILL 1 DROP INTO BOTH EYES TWICE A DAY     No current facility-administered medications for this visit.     Musculoskeletal: Strength & Muscle Tone:  UTA Gait & Station:  Seated Patient leans: N/A  Psychiatric Specialty Exam: Review of Systems  Gastrointestinal:        Swallowing  difficulty  Psychiatric/Behavioral:         Mood swings  All other systems reviewed and are negative.  There were no vitals taken for this visit.There is no height or weight on file to calculate BMI.  General Appearance: Casual  Eye Contact:  Good  Speech:  Clear and Coherent  Volume:  Normal  Mood:   mood swings  Affect:  Congruent  Thought Process:  Goal Directed and Descriptions of Associations: Intact  Orientation:  Full (Time, Place, and Person)  Thought Content: Logical   Suicidal Thoughts:  No  Homicidal Thoughts:  No  Memory:  Immediate;   Fair Recent;   Fair Remote;   Fair  Judgement:  Fair  Insight:  Fair  Psychomotor Activity:  Normal  Concentration:  Concentration: Fair and Attention Span: Fair  Recall:  [employer] of Knowledge: Fair  Language: Fair  Akathisia:  No  Handed:  Right  AIMS (if indicated): done  Assets:  Communication Skills [employer] Talents/Skills Transportation Vocational/Educational  ADL's:  Intact  Cognition: WNL  Sleep:  Fair   Screenings: GAD-7    [place] Office Visit from 12/28/2019 in [facility] Office Visit from [facility]/17/2019 [facility] Office Visit from 11/20/2018 in [facility] Office Visit from 08/18/2018 in [facility] Office Visit from 07/22/2018 in [facility]  Total GAD-7 Score '2 2 4 9 8      '$ PHQ2-9    Flowsheet Row Video Visit from 08/02/2020 in [facility] Video Visit from 07/18/2020 in [facility] Video Visit from 04/25/2020 in [facility] from 03/29/2020 in [facility] Office Visit from 12/28/2019 in [facility]  PHQ-2 Total Score '1 6 2 4 '$ 0  PHQ-9 Total Score '10 21 5 9 1      '$ Flowsheet Row Video Visit from 08/02/2020 in [facility]  Admission (Discharged) from 06/13/2020 in [facility] No Risk No Risk        Assessment and Plan: Dejanae Neyra is a 65 year old Caucasian female, has a history of PTSD, bipolar disorder, panic disorder, migraine headache, chronic pain was evaluated by telemedicine today.  Patient with mood swings, dysphagia, weight gain side effects to Abilify, will benefit from the following medication changes.  Plan Bipolar disorder mixed mild-improving Increase lamotrigine to 125 mg p.o. daily in divided dosage We will reduce Abilify to 2.5 mg p.o. daily.  Plan to taper it off. Gabapentin 600 mg p.o. 3 times daily.  Prescribed by primary care provider.  Advised patient to take it at the end of the day if she feels sluggish during the day.  Polypharmacy could be contributing to her sluggishness.  PTSD-stable Venlafaxine as prescribed Hydroxyzine 25 mg p.o. daily as needed for severe anxiety attacks  Panic disorder-stable Venlafaxine as prescribed  Insomnia-stable Trazodone 50-100 mg p.o. nightly as needed Melatonin 10 mg p.o. nightly. Patient was advised to get sleep study, she has reached out to her primary care provider.  Weight gain likely secondary to medication-unstable Continue dietary modification, exercise lifestyle changes. Taper of Abilify.  Follow-up in clinic in 2 weeks or sooner if needed.  This note was generated in part or whole with voice recognition software. Voice recognition is usually quite accurate but there are transcription errors that can and very often do occur. I apologize for any typographical errors that were not detected and corrected.        Ursula Alert, MD 09/23/2020, 8:41 AM

## 2020-09-23 ENCOUNTER — Encounter: Payer: Self-pay | Admitting: Family Medicine

## 2020-09-26 ENCOUNTER — Other Ambulatory Visit: Payer: Self-pay | Admitting: Cardiovascular Disease

## 2020-09-29 ENCOUNTER — Encounter: Payer: Self-pay | Admitting: Internal Medicine

## 2020-10-03 ENCOUNTER — Ambulatory Visit: Payer: 59

## 2020-10-05 ENCOUNTER — Ambulatory Visit: Payer: Self-pay | Admitting: *Deleted

## 2020-10-05 ENCOUNTER — Encounter: Payer: Self-pay | Admitting: Internal Medicine

## 2020-10-05 NOTE — Telephone Encounter (Signed)
Calls with severe lower back/buttock pain on the left. 8 on the pain scale.Pain shoots down the front of the left leg with the foot feeling numb. This began after a 10 1/2 drive Saturday night into Sunday morning. Minimal stops during her trip. Left leg feels weak today. No difficulty voiding. Tramadol and other medications she takes regularly has not helped the pain. Used heat and ice without relief. No availability at pcp. Advised [facility] today. Patient agreed with advice.   Reason for Disposition  Weakness of a leg or foot (e.g., unable to bear weight, dragging foot)  Answer Assessment - Initial Assessment Questions 1. ONSET: "When did the pain begin?"      sunday 2. LOCATION: "Where does it hurt?" (upper, mid or lower back)     Lower back buttocks pain shoots down the leg 3. SEVERITY: "How bad is the pain?"  (e.g., Scale 1-10; mild, moderate, or severe)   - MILD (1-3): doesn't interfere with normal activities    - MODERATE (4-7): interferes with normal activities or awakens from sleep    - SEVERE (8-10): excruciating pain, unable to do any normal activities      8 4. PATTERN: "Is the pain constant?" (e.g., yes, no; constant, intermittent)    constant 5. RADIATION: "Does the pain shoot into your legs or elsewhere?"     Yes down the front leg 6. CAUSE:  "What do you think is causing the back pain?"      Drove from [place] to home on Saturday for 10 hours 7. BACK OVERUSE:  "Any recent lifting of heavy objects, strenuous work or exercise?"     Driving for 10 hours 8. MEDICATIONS: "What have you taken so far for the pain?" (e.g., nothing, acetaminophen, NSAIDS)     Tramadol and her regular medications 9. NEUROLOGIC SYMPTOMS: "Do you have any weakness, numbness, or problems with bowel/bladder control?"     no 10. OTHER SYMPTOMS: "Do you have any other symptoms?" (e.g., fever, abdominal pain, burning with urination, blood in urine)       no 11. PREGNANCY: "Is there any chance you  are pregnant?" (e.g., yes, no; LMP)       na  Protocols used: Back Pain-A-AH

## 2020-10-06 ENCOUNTER — Telehealth (INDEPENDENT_AMBULATORY_CARE_PROVIDER_SITE_OTHER): Payer: 59 | Admitting: Psychiatry

## 2020-10-06 ENCOUNTER — Other Ambulatory Visit: Payer: Self-pay

## 2020-10-06 ENCOUNTER — Encounter: Payer: Self-pay | Admitting: Psychiatry

## 2020-10-06 DIAGNOSIS — F3161 Bipolar disorder, current episode mixed, mild: Secondary | ICD-10-CM

## 2020-10-06 DIAGNOSIS — F4310 Post-traumatic stress disorder, unspecified: Secondary | ICD-10-CM

## 2020-10-06 DIAGNOSIS — R635 Abnormal weight gain: Secondary | ICD-10-CM

## 2020-10-06 DIAGNOSIS — G4701 Insomnia due to medical condition: Secondary | ICD-10-CM | POA: Diagnosis not present

## 2020-10-06 DIAGNOSIS — F410 Panic disorder [episodic paroxysmal anxiety] without agoraphobia: Secondary | ICD-10-CM | POA: Diagnosis not present

## 2020-10-06 MED ORDER — LAMOTRIGINE 150 MG PO TABS: 75.0000 mg | ORAL_TABLET | Freq: Two times a day (BID) | ORAL | 0 refills | Status: DC

## 2020-10-06 NOTE — Progress Notes (Signed)
Virtual Visit via Video Note  I connected with Ashley Fields on 10/06/20 at  8:30 AM EDT by a video enabled telemedicine application and verified that I am speaking with the correct person using two identifiers.  Location Provider Location : [facility] Patient Location : Home  Participants: Patient , Provider    I discussed the limitations of evaluation and management by telemedicine and the availability of in person appointments. The patient expressed understanding and agreed to proceed.   I discussed the assessment and treatment plan with the patient. The patient was provided an opportunity to ask questions and all were answered. The patient agreed with the plan and demonstrated an understanding of the instructions.   The patient was advised to call back or seek an in-person evaluation if the symptoms worsen or if the condition fails to improve as anticipated.  [facility] MD OP Progress Note  10/06/2020 8:47 AM Ashley Fields  MRN:  [id number]  Chief Complaint:  Chief Complaint   Follow-up; Anxiety; Depression    HPI: Ashley Fields is a 65 year old Caucasian female, married, disability, lives in [place], has a history of bipolar disorder, PTSD, chronic pain, migraine headaches, history of laparoscopic gastric bypass was evaluated by telemedicine today.  Patient reports she is currently struggling with back pain, reports she has appointment with emergent ortho on Monday.  She reports the pain does affect her functioning during the day.  She reports her husband has been very helpful.  She reports she has been sleeping on the recliner since Sunday and that is the only way she can get some rest.  Patient reports she is okay with tapering off the Abilify completely.  She has not had any worsening mood symptoms and reports Lamictal as helpful.  Denies side effects.  Patient denies any suicidality, homicidality or perceptual disturbances.  Patient denies any other concerns today.  Visit Diagnosis:     ICD-10-CM   1. Bipolar 1 disorder, mixed, mild (HCC)  F31.61 lamoTRIgine (LAMICTAL) 150 MG tablet    2. PTSD (post-traumatic stress disorder)  F43.10     3. Panic disorder  F41.0     4. Insomnia due to medical condition  G47.01    pain, mood    5. Weight gain  R63.5    likely secondary to antipsychotics      Past Psychiatric History: Reviewed past psychiatric history from progress note on 05/15/2018.  Past trials of Prozac, Wellbutrin, Lexapro, Cymbalta, Rexulti, Ambien-nightmares, Lunesta  Past Medical History:  Past Medical History:  Diagnosis Date   Allergy    Anemia    Anxiety    Arthritis    Yes   Blood transfusion without reported diagnosis 1977   Transfusions from miscarriage & hemorrhaging   Chronic kidney disease    Depression    Disp fx of cuboid bone of right foot, init for clos fx 01/01/2017   GERD (gastroesophageal reflux disease)    Headache    Hepatitis C    Hyperlipidemia    Neuromuscular disorder (HCC) 1997   Falling to much was an eye-opener   Osteoporosis    Thyroid disease    Urine incontinence     Past Surgical History:  Procedure Laterality Date   5 miscarriages     19 [phone], Blood transfusion s/p miscarriage [place]   Total   APPENDECTOMY  12/2008   AUGMENTATION MAMMAPLASTY Bilateral 2015   Bilat   AUGMENTATION MAMMAPLASTY Bilateral 2018   implants redone w/ placement of  implants under muscle   breast lift bilateral, implants  01/75/1025   bilateral, silicon naturel   BREAST REDUCTION SURGERY Bilateral 1997   CESAREAN SECTION  07/27/1981   Placenta Previa   CHOLECYSTECTOMY  03/2004   Lap surgery   COLONOSCOPY WITH PROPOFOL N/A 06/13/2020   Procedure: COLONOSCOPY WITH PROPOFOL;  Surgeon: Virgel Manifold, MD;  Location: [facility] END[facility]COPY;  Service: Endoscopy;  Laterality: N/A;   COSMETIC SURGERY     Breast implants w/ lift, tummy tuck, upper & lower Blepharop   ESOPHAGOGASTRODUODENOSCOPY (EGD) WITH PROPOFOL  N/A 06/13/2020   Procedure: ESOPHAGOGASTRODUODENOSCOPY (EGD) WITH PROPOFOL;  Surgeon: Virgel Manifold, MD;  Location: [facility] ENDOSCOPY;  Service: Endoscopy;  Laterality: N/A;   EYE SURGERY  2017   Cataract surgery & lasik   GASTRIC BYPASS  2005   Laparoscopic   IVC FILTER INSERTION  12/2003   TrapEase Vena Cava Filter   LUMBAR LAMINECTOMY  06/2006   L4-L5 (spinal fusion)   mini tummy tuck  05/07/2016   Bilateral bra/back roll lift skin removal   neck surgery C3-C7  02/10/2015   ACDF   REDUCTION MAMMAPLASTY Bilateral 1997   SHOULDER ACROMIOPLASTY Left 03/27/2013   w/ labral debridement   SMALL INTESTINE SURGERY  12/24/2003   Lap Gastric Bypass   SPINAL CORD STIMULATOR IMPLANT  11/2010   removed 05/2011   SPINE SURGERY  2008 2017   Fusion L4-L5, ACDF C4-C7    Family Psychiatric History: Reviewed family psychiatric history from progress note on 05/15/2018  Family History:  Family History  Problem Relation Age of Onset   COPD Mother    Lung cancer Mother    Diabetes Mother    Heart disease Mother    Stroke Mother    Heart attack Mother    Arthritis Mother    Cancer Mother    Hypertension Mother    Obesity Mother    Varicose Veins Mother    Heart disease Father    Heart attack Father 74   Early death Father    Depression Sister        x 37 sisters   COPD Sister    Hypertension Sister    Heart disease Brother        x 3 brothers   Diabetes Brother    Colon polyps Sister    Hearing loss Sister    Heart attack Sister    Heart attack Brother    Arthritis Sister    Varicose Veins Sister    Arthritis Brother    Heart disease Brother    Hypertension Brother    Cancer Maternal Grandfather    Stroke Maternal Grandfather    COPD Sister    Obesity Sister    Depression Sister    Depression Sister    Heart disease Sister    Hypertension Sister    Diabetes Brother    Heart disease Brother    Hypertension Brother    Obesity Brother    Varicose Veins Brother    Heart  disease Brother    Hypertension Brother    Obesity Brother    Hypertension Sister    Obesity Sister    Miscarriages / Stillbirths Maternal Aunt    Miscarriages / Stillbirths Paternal Aunt    Breast cancer Neg Hx     Social History: Reviewed social history from progress note on 05/15/2018 Social History   Socioeconomic History   Marital status: Married    Spouse name: Not on file   Number of  children: 1   Years of education: High School   Highest education level: Some college, no degree  Occupational History   Occupation: disability  Tobacco Use   Smoking status: Former    Packs/day: 0.25    Years: 10.00    Pack years: 2.50    Types: Cigarettes    Quit date: 11/29/1995    Years since quitting: 24.8   Smokeless tobacco: Former   Tobacco comments:    Quite 1997, didnt smoke hardly at all when I was smoking.  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]   Social Determinants of Radio broadcast assistant Strain: Low Risk    Difficulty of Paying Living Expenses: Not hard at all  Food Insecurity: No Food Insecurity   Worried About Charity fundraiser in the Last Year: Never true   Arboriculturist in the Last Year: Never true  Transportation Needs: No Transportation Needs   Lack of Transportation (Medical): No   Lack of Transportation (Non-Medical): No  Physical Activity: Inactive   Days of Exercise per Week: 0 days   Minutes of Exercise per Session: 0 min  Stress: Stress Concern Present   Feeling of Stress : Very much  Social Connections: Not on file    Allergies:  Allergies  Allergen Reactions   Flagyl  [Metronidazole] Anaphylaxis   Cymbalta [Duloxetine Hcl]    Tape Rash    Metabolic Disorder Labs: Lab Results  Component Value Date   HGBA1C 5.0 03/30/2020   MPG 97 03/30/2020   MPG 97 03/17/2019   No results found for: PROLACTIN Lab Results  Component Value Date   CHOL 152 03/30/2020   TRIG 67 03/30/2020   HDL 89 03/30/2020   CHOLHDL 1.7 03/30/2020   LDLCALC 49 03/30/2020   LDLCALC 42 09/22/2019   Lab Results  Component Value Date   TSH 1.97 03/30/2020   TSH 0.86 08/11/2018    Therapeutic Level Labs: No results found for: LITHIUM No results found for: VALPROATE No components found for:  CBMZ  Current Medications: Current Outpatient Medications  Medication Sig Dispense Refill   lamoTRIgine (LAMICTAL) 150 MG tablet Take 0.5 tablets (75 mg total) by mouth 2 (two) times daily. 90 tablet 0   ASPIRIN LOW DOSE 81 MG EC tablet TAKE 1 TABLET BY MOUTH EVERY DAY 90 tablet 3   butalbital-acetaminophen-caffeine (FIORICET) 50-325-40 MG tablet Take 1 tablet by mouth 2 (two) times daily as needed for headache.     Cholecalciferol 250 MCG (10000 UT) CAPS Take by mouth.     cyanocobalamin (,VITAMIN B-12,) 1000 MCG/ML injection Inject 1,000 mcg into the muscle every 30 (thirty) days.     diclofenac Sodium (VOLTAREN) 1 % GEL Apply 2 g topically 4 (four) times daily.     estradiol (ESTRACE) 1 MG tablet Take 1 tablet (1 mg total) by mouth daily. 90 tablet 0   etodolac (LODINE) 500 MG tablet TAKE 1 TABLET BY MOUTH TWICE A DAY 180 tablet 1   ezetimibe (ZETIA) 10 MG tablet TAKE ONE TABLET BY MOUTH ONE TIME DAILY 90 tablet 1   fluticasone (  FLONASE) 50 MCG/ACT nasal spray SPRAY 2 SPRAYS INTO EACH NOSTRIL EVERY DAY 48 mL 3   furosemide (LASIX) 20 MG tablet TAKE 1 TABLET EVERY DAY FOR FLUID OR EDEMA AS NEEDED 90 tablet 0   gabapentin (NEURONTIN) 600 MG tablet Take 1 tablet (600 mg total) by mouth 3 (three) times daily. 90 tablet 3   hydrOXYzine (ATARAX/VISTARIL) 25 MG tablet TAKE 1/2 TO 1 TABLET  BY MOUTH TWICE A DAY AS NEEDED FOR SEVERE PANIC ATTACKS/ ANXIETY 180 tablet 1   LORazepam (ATIVAN) 0.5 MG tablet Ativan 0.5 mg tablet  1 tab - 40 min before injection  bring with you on the day of the injection and take it 40 min prior to the procedure     mometasone (ELOCON) 0.1 % cream Apply 1 application topically 2 (two) times daily.     ondansetron (ZOFRAN ODT) 4 MG disintegrating tablet Take 1 tablet (4 mg total) by mouth every 8 (eight) hours as needed for nausea or vomiting. 30 tablet 2   OZEMPIC, 1 MG/DOSE, 4 MG/3ML SOPN INJECT 1MG  INTO THE SKIN ONCE A WEEK 3 mL 2   propranolol (INDERAL) 10 MG tablet TAKE ONE TABLET BY MOUTH THREE TIMES DAILY as needed for severe anxiety attacks (limited supply) 90 tablet 0   rosuvastatin (CRESTOR) 40 MG tablet TAKE 1 TABLET EVERY DAY 90 tablet 1   tiZANidine (ZANAFLEX) 4 MG tablet Take 4 mg by mouth 4 (four) times daily.     traMADol (ULTRAM) 50 MG tablet Take 50 mg by mouth 4 (four) times daily.     traZODone (DESYREL) 100 MG tablet Take 1 tablet (100 mg total) by mouth at bedtime. 90 tablet 0   venlafaxine XR (EFFEXOR-XR) 75 MG 24 hr capsule TAKE ONE CAPSULE BY MOUTH DAILY with breakfast 90 capsule 1   XIIDRA 5 % SOLN INSTILL 1 DROP INTO BOTH EYES TWICE A DAY     No current facility-administered medications for this visit.     Musculoskeletal: Strength & Muscle Tone:  UTA Gait & Station:  seated Patient leans: N/A  Psychiatric Specialty Exam: Review of Systems  Musculoskeletal:  Positive for back pain.  Psychiatric/Behavioral:  Positive for sleep disturbance. The patient is nervous/anxious.   All other systems reviewed and are negative.  There were no vitals taken for this visit.There is no height or weight on file to calculate BMI.  General Appearance: Casual  Eye Contact:  Fair  Speech:  Clear and Coherent  Volume:  Normal  Mood:  Anxious, mood swings improving  Affect:  Congruent  Thought Process:  Goal Directed and Descriptions of  Associations: Intact  Orientation:  Full (Time, Place, and Person)  Thought Content: Logical   Suicidal Thoughts:  No  Homicidal Thoughts:  No  Memory:  Immediate;   Fair Recent;   Fair Remote;   Fair  Judgement:  Fair  Insight:  Fair  Psychomotor Activity:  Normal  Concentration:  Concentration: Fair and Attention Span: Fair  Recall:  [employer] of Knowledge: Fair  Language: Fair  Akathisia:  No  Handed:  Right  AIMS (if indicated): done  Assets:  Communication Skills Desire for Improvement Housing Intimacy Social Support Transportation Vocational/Educational  ADL's:  Intact  Cognition: WNL  Sleep:   restless due to pain - sleeps ok on recliner   Screenings: [place] Office Visit from 12/28/2019 in [facility] Office Visit from 03/17/2019 in [facility] Office Visit from  11/20/2018 in [facility] Office Visit from 08/18/2018 in [facility] Office Visit from 07/22/2018 in [facility]  Total GAD-7 Score 2 2 4 9 8       PHQ2-9    Flowsheet Row Video Visit from 08/02/2020 in [place] Video Visit from 07/18/2020 in [facility] [place] Video Visit from 04/25/2020 in [place] from 03/29/2020 in [facility] Office Visit from 12/28/2019 in [place]  PHQ-2 Total Score 1 6 2 4  0  PHQ-9 Total Score 10 21 5 9 1       Flowsheet Row Video Visit from 08/02/2020 in [place] Admission (Discharged) from 06/13/2020 in [place] No Risk No Risk        Assessment and Plan: Ashley Fields is a 65 year old Caucasian female, has a history of PTSD, bipolar disorder, panic disorder, migraine headache, chronic pain was evaluated by telemedicine today.  Patient with back pain, which does have an  impact on her mood and sleep however reports Lamictal as helpful and is tolerating being tapered off of Abilify.  Discussed plan as noted below.  Plan Bipolar disorder mixed mild-improving Increase lamotrigine to 150 mg p.o. daily in divided dosage Discontinue Abilify. Gabapentin 600 mg 3 times a day-prescribed by primary care provider.  Patient advised to take it at the end of the day if she feels sluggish during the day.  PTSD-stable Venlafaxine as prescribed Hydroxyzine 25 mg p.o. daily as needed for severe anxiety attacks  Panic disorder-stable Venlafaxine 75 mg p.o. daily.  Insomnia-stable Trazodone 50-100 mg p.o. nightly as needed Melatonin 10 mg p.o. nightly Patient advised to get sleep study.  Weight gain likely secondary to medication-unstable Continue dietary modifications. Discontinue Abilify.  Patient will benefit from sufficient pain management.  Patient advised to go to [facility].  Follow-up in clinic in 4 weeks in person.  This note was generated in part or whole with voice recognition software. Voice recognition is usually quite accurate but there are transcription errors that can and very often do occur. I apologize for any typographical errors that were not detected and corrected.       Ursula Alert, MD 10/06/2020, 8:47 AM

## 2020-10-19 ENCOUNTER — Other Ambulatory Visit: Payer: Self-pay | Admitting: Cardiovascular Disease

## 2020-10-19 DIAGNOSIS — E782 Mixed hyperlipidemia: Secondary | ICD-10-CM

## 2020-10-20 ENCOUNTER — Other Ambulatory Visit: Payer: Self-pay | Admitting: Psychiatry

## 2020-10-20 DIAGNOSIS — F410 Panic disorder [episodic paroxysmal anxiety] without agoraphobia: Secondary | ICD-10-CM

## 2020-10-20 DIAGNOSIS — F3176 Bipolar disorder, in full remission, most recent episode depressed: Secondary | ICD-10-CM

## 2020-10-20 DIAGNOSIS — F4310 Post-traumatic stress disorder, unspecified: Secondary | ICD-10-CM

## 2020-10-25 ENCOUNTER — Encounter: Payer: Self-pay | Admitting: Family Medicine

## 2020-10-25 ENCOUNTER — Ambulatory Visit (INDEPENDENT_AMBULATORY_CARE_PROVIDER_SITE_OTHER): Payer: 59 | Admitting: Family Medicine

## 2020-10-25 ENCOUNTER — Other Ambulatory Visit: Payer: Self-pay

## 2020-10-25 VITALS — BP 136/62 | HR 98 | Ht 62.0 in | Wt 197.2 lb

## 2020-10-25 DIAGNOSIS — H6983 Other specified disorders of Eustachian tube, bilateral: Secondary | ICD-10-CM | POA: Diagnosis not present

## 2020-10-25 DIAGNOSIS — J0110 Acute frontal sinusitis, unspecified: Secondary | ICD-10-CM

## 2020-10-25 DIAGNOSIS — S93492A Sprain of other ligament of left ankle, initial encounter: Secondary | ICD-10-CM

## 2020-10-25 MED ORDER — AMOXICILLIN-POT CLAVULANATE 875-125 MG PO TABS: 1.0000 | ORAL_TABLET | Freq: Two times a day (BID) | ORAL | 0 refills | Status: DC

## 2020-10-25 NOTE — Patient Instructions (Addendum)
Thank you for coming to the office today.  Start augmentin for sinuses  Start nasal steroid Flonase 2 sprays in each nostril daily for 4-6 weeks, may repeat course seasonally or as needed  Follow-up with spine specialist on the incontinence   -------------------------------  Likely ankle sprain  Ankle Sprain It seems like you have a mild - to moderate ankle sprain, this should gradually heal on its own, but it does take time to get back to 100% normal. The biggest concern is to avoid any re-injury as your ankle is now weaker while it is healing. Please try to avoid any activities that cause pain. It is important to modify your activities to allow this to heal. You may benefit from an Ankle Brace (over the counter) to limit movement in your ankle If you are unable to walk without pain after a few days, then crutches can be used   Ice the area for 20 minutes at least 3-4 times a day Wrap the ankle with a compression wrap to prevent swelling Elevate the leg above the level of the heart as much as possible  Ankle Lace up Brace  Check pharmacy or online [facility] or medical supply store listed below.  [address], [place] [address] Open until 5PM Phone: [phone]  [facility] / [facility] for [address] [place], [place] [address] Ph [phone] - Lateral shoe wedges as well  Please schedule a Follow-up Appointment to: Return in about 1 week (around 11/01/2020), or if symptoms worsen or fail to improve.  If you have any other questions or concerns, please feel free to call the office or send a message through [place]. You may also schedule an earlier appointment if necessary.  Additionally, you may be receiving a survey about your experience at our office within a few days to 1 week by e-mail or mail. We value your feedback.  Ashley Putnam, DO [place]

## 2020-10-25 NOTE — Progress Notes (Signed)
Subjective:    Patient ID: Delayni Streed, female    DOB: 1956/01/05, 65 y.o.   MRN: [id number]  Finley Dinkel is a 65 y.o. female presenting on 10/25/2020 for Facial Pain and Knee Injury   HPI  Acute sinusitis Left Ear Pain Dysquilibrium Onset  Admits feels like her sense of balance is off She describes sinus pressure middle of face/head with headache and frontal sinus  Lumbar spinal stenosis Back Pain She received SI joint injections recently. With improvement. Admits numbness in Left foot, she has known spinal stenosis She had a sprain on L Ankle recently while walking outdoors. Has improved.  Fever blisters on lip Using topical abreva  Hold off on Sleep Study for now, she corresponded on mychart in past   Depression screen [facility] 2/9 03/29/2020 12/28/2019 09/21/2019  Decreased Interest 1 0 0  Down, Depressed, Hopeless 3 0 0  PHQ - 2 Score 4 0 0  Altered sleeping 2 1 0  Tired, decreased energy 3 0 0  Change in appetite 0 0 0  Feeling bad or failure about yourself  0 0 0  Trouble concentrating 0 0 0  Moving slowly or fidgety/restless 0 0 0  Suicidal thoughts 0 0 0  PHQ-9 Score 9 1 0  Difficult doing work/chores Somewhat difficult Not difficult at all Not difficult at all  Some encounter information is confidential and restricted. Go to Review Flowsheets activity to see all data.  Some recent data might be hidden    Social History   Tobacco Use   Smoking status: Former    Packs/day: 0.25    Years: 10.00    Pack years: 2.50    Types: Cigarettes    Quit date: 11/29/1995    Years since quitting: 24.9   Smokeless tobacco: Former   Tobacco comments:    Quite 1997, didnt smoke hardly at all when I was smoking.  Vaping Use   Vaping Use: Never used  Substance Use Topics   Alcohol use: No   Drug use: No    Review of Systems Per HPI unless specifically indicated above     Objective:    BP 136/62   Pulse 98   Ht 5\' 2"  (1.575 m)   Wt 197 lb 3.2 oz (89.4 kg)    SpO2 100%   BMI 36.07 kg/m   Wt Readings from Last 3 Encounters:  10/25/20 197 lb 3.2 oz (89.4 kg)  09/06/20 198 lb 6.4 oz (90 kg)  09/05/20 195 lb (88.5 kg)    Physical Exam Vitals and nursing note reviewed.  Constitutional:      General: She is not in acute distress.    Appearance: Normal appearance. She is well-developed. She is not diaphoretic.     Comments: Well-appearing, comfortable, cooperative  HENT:     Head: Normocephalic and atraumatic.  Eyes:     General:        Right eye: No discharge.        Left eye: No discharge.     Conjunctiva/sclera: Conjunctivae normal.  Cardiovascular:     Rate and Rhythm: Normal rate.  Pulmonary:     Effort: Pulmonary effort is normal.  Musculoskeletal:     Comments: L Ankle no ecchymosis, mild tender over lateral ATF no edema  Skin:    General: Skin is warm and dry.     Findings: No erythema or rash.  Neurological:     Mental Status: She is alert and oriented to person, place, and time.  Psychiatric:        Mood and Affect: Mood normal.        Behavior: Behavior normal.        Thought Content: Thought content normal.     Comments: Well groomed, good eye contact, normal speech and thoughts   Results for orders placed or performed in visit on 09/02/20  Iron and TIBC  Result Value Ref Range   Iron 100 28 - 170 ug/dL   TIBC 444 250 - 450 ug/dL   Saturation Ratios 23 10.4 - 31.8 %   UIBC 344 ug/dL  Ferritin  Result Value Ref Range   Ferritin 15 11 - 307 ng/mL  Basic metabolic panel  Result Value Ref Range   Sodium 139 135 - 145 mmol/L   Potassium 4.2 3.5 - 5.1 mmol/L   Chloride 103 98 - 111 mmol/L   CO2 29 22 - 32 mmol/L   Glucose, Bld 89 70 - 99 mg/dL   BUN 14 8 - 23 mg/dL   Creatinine, Ser 0.78 0.44 - 1.00 mg/dL   Calcium 9.2 8.9 - 10.3 mg/dL   GFR, Estimated >60 >60 mL/min   Anion gap 7 5 - 15  CBC with Differential  Result Value Ref Range   WBC 5.2 4.0 - 10.5 K/uL   RBC 4.33 3.87 - 5.11 MIL/uL   Hemoglobin 13.1  12.0 - 15.0 g/dL   HCT 40.9 36.0 - 46.0 %   MCV 94.5 80.0 - 100.0 fL   MCH 30.3 26.0 - 34.0 pg   MCHC 32.0 30.0 - 36.0 g/dL   RDW 12.4 11.5 - 15.5 %   Platelets 152 150 - 400 K/uL   nRBC 0.0 0.0 - 0.2 %   Neutrophils Relative % 65 %   Neutro Abs 3.3 1.7 - 7.7 K/uL   Lymphocytes Relative 24 %   Lymphs Abs 1.2 0.7 - 4.0 K/uL   Monocytes Relative 8 %   Monocytes Absolute 0.4 0.1 - 1.0 K/uL   Eosinophils Relative 3 %   Eosinophils Absolute 0.2 0.0 - 0.5 K/uL   Basophils Relative 0 %   Basophils Absolute 0.0 0.0 - 0.1 K/uL   Immature Granulocytes 0 %   Abs Immature Granulocytes 0.01 0.00 - 0.07 K/uL      Assessment & Plan:   Problem List Items Addressed This Visit   None Visit Diagnoses     Acute non-recurrent frontal sinusitis    -  Primary   Relevant Medications   amoxicillin-clavulanate (AUGMENTIN) 875-125 MG tablet   Sprain of anterior talofibular ligament of left ankle, initial encounter       Eustachian tube dysfunction, bilateral           Sinusitis Eustachian Tube dysfunction w/ mild effusion No sign of AOM Continue allergy/anti histamine Use flonase regularly instead of PRN for eustachian tube dysfunction Treat with Augmentin course  L Ankle sprain Mild ATF ligament, routine healing. No complication Use lace up ankle brace for support  Recommend that she discuss some lower extremity sensory changes w/ spine specialist given spinal stenosis.    Meds ordered this encounter  Medications   amoxicillin-clavulanate (AUGMENTIN) 875-125 MG tablet    Sig: Take 1 tablet by mouth 2 (two) times daily. For 10 days    Dispense:  20 tablet    Refill:  0      Follow up plan: Return in about 1 week (around 11/01/2020), or if symptoms worsen or fail to improve.   Nobie Putnam, DO  [facility] 10/25/2020, 1:56 PM

## 2020-11-04 ENCOUNTER — Ambulatory Visit: Payer: 59 | Admitting: Psychiatry

## 2020-11-10 ENCOUNTER — Ambulatory Visit: Payer: 59 | Admitting: Family Medicine

## 2020-11-14 ENCOUNTER — Other Ambulatory Visit: Payer: Self-pay | Admitting: Family Medicine

## 2020-11-14 DIAGNOSIS — Z90710 Acquired absence of both cervix and uterus: Secondary | ICD-10-CM

## 2020-11-14 NOTE — Telephone Encounter (Signed)
Requested medication (s) are due for refill today  Yes  Requested medication (s) are on the active medication list Yes  Future visit scheduled No.  LOV 10/25/20  Note to clinic-Current prescription with end date of 11/16/20, all protocols met for refill at this time. Routing to provider for review.    Requested Prescriptions  Pending Prescriptions Disp Refills   estradiol (ESTRACE) 1 MG tablet [Pharmacy Med Name: ESTRADIOL 1 MG Tablet] 90 tablet 0    Sig: TAKE 1 TABLET EVERY DAY     OB/GYN:  Estrogens Passed - 11/14/2020  3:48 AM      Passed - Mammogram is up-to-date per Health Maintenance      Passed - Last BP in normal range    BP Readings from Last 1 Encounters:  10/25/20 136/62          Passed - Valid encounter within last 12 months    Recent Outpatient Visits           2 weeks ago Acute non-recurrent frontal sinusitis   [facility] [place], Devonne Doughty, DO   6 months ago Annual physical exam   [facility] Olin Hauser, DO   9 months ago Acute viral pharyngitis   [facility] Olin Hauser, DO   10 months ago Morbid obesity [facility])   [place], DO   11 months ago Acute pain of left knee   [place], DO       Future Appointments             In 3 weeks Virgel Manifold, MD [place]   In 2 months [place], Kathlene November, MD [facility], [place]   In 4 months  [facility], [facility]

## 2020-11-20 ENCOUNTER — Other Ambulatory Visit: Payer: Self-pay | Admitting: Psychiatry

## 2020-11-20 DIAGNOSIS — F3176 Bipolar disorder, in full remission, most recent episode depressed: Secondary | ICD-10-CM

## 2020-11-20 DIAGNOSIS — F4310 Post-traumatic stress disorder, unspecified: Secondary | ICD-10-CM

## 2020-11-20 DIAGNOSIS — F410 Panic disorder [episodic paroxysmal anxiety] without agoraphobia: Secondary | ICD-10-CM

## 2020-11-23 ENCOUNTER — Other Ambulatory Visit: Payer: Self-pay

## 2020-11-23 ENCOUNTER — Encounter: Payer: Self-pay | Admitting: Psychiatry

## 2020-11-23 ENCOUNTER — Telehealth: Payer: Self-pay

## 2020-11-23 ENCOUNTER — Telehealth (INDEPENDENT_AMBULATORY_CARE_PROVIDER_SITE_OTHER): Payer: 59 | Admitting: Psychiatry

## 2020-11-23 DIAGNOSIS — F410 Panic disorder [episodic paroxysmal anxiety] without agoraphobia: Secondary | ICD-10-CM | POA: Diagnosis not present

## 2020-11-23 DIAGNOSIS — R635 Abnormal weight gain: Secondary | ICD-10-CM

## 2020-11-23 DIAGNOSIS — F3162 Bipolar disorder, current episode mixed, moderate: Secondary | ICD-10-CM

## 2020-11-23 DIAGNOSIS — F4310 Post-traumatic stress disorder, unspecified: Secondary | ICD-10-CM | POA: Diagnosis not present

## 2020-11-23 DIAGNOSIS — G4701 Insomnia due to medical condition: Secondary | ICD-10-CM | POA: Diagnosis not present

## 2020-11-23 MED ORDER — CARIPRAZINE HCL 1.5 MG PO CAPS: 1.5000 mg | ORAL_CAPSULE | Freq: Every day | ORAL | 0 refills | Status: DC

## 2020-11-23 NOTE — Telephone Encounter (Signed)
received fax requesting a prior auth for the vraylar.  

## 2020-11-23 NOTE — Progress Notes (Signed)
Virtual Visit via Video Note  I connected with Ashley Fields on 11/23/20 at 11:30 AM EST by a video enabled telemedicine application and verified that I am speaking with the correct person using two identifiers.  Location Provider Location : [facility] Patient Location : Home  Participants: Patient , Provider   I discussed the limitations of evaluation and management by telemedicine and the availability of in person appointments. The patient expressed understanding and agreed to proceed.   I discussed the assessment and treatment plan with the patient. The patient was provided an opportunity to ask questions and all were answered. The patient agreed with the plan and demonstrated an understanding of the instructions.   The patient was advised to call back or seek an in-person evaluation if the symptoms worsen or if the condition fails to improve as anticipated.                                                                 [facility] MD OP Progress Note  11/23/2020 12:48 PM Ashley Fields  MRN:  [id number]  Chief Complaint:  Chief Complaint   Follow-up; Anxiety; Depression    HPI: Ashley Fields is a 65 year old Caucasian female, married, on disability, lives in [place], has a history of bipolar disorder, PTSD, chronic pain, migraine headaches, history of laparoscopic gastric bypass was evaluated by telemedicine today.  Patient today reports she is currently in a lot of back pain which does have an impact on her mood.  She is trying to get pain controlled and is trying to work with her providers for the same.  She reports being in pain has been affecting her mood, she struggles with sadness, low motivation, anhedonia, irritability and anxiety.  This has been getting worse since the past few days.  Patient reports she also has psychosocial stressors of interpersonal relationship struggles with her sister who currently moved back two miles away from her home.  This is also a stressor for her.  Patient  denies any suicidality, homicidality or perceptual disturbances.  Patient is compliant on her medications.  Patient denies any other concerns today.  Visit Diagnosis:    ICD-10-CM   1. Bipolar 1 disorder, mixed, moderate (HCC)  F31.62 cariprazine (VRAYLAR) 1.5 MG capsule    2. PTSD (post-traumatic stress disorder)  F43.10     3. Panic disorder  F41.0     4. Insomnia due to medical condition  G47.01    pain, mood    5. Weight gain  R63.5    likely secondary to psychotropics      Past Psychiatric History: Reviewed past psychiatric history from progress note on 05/15/2018.  Past trials of Prozac, Wellbutrin, Lexapro, Cymbalta, Rexulti, Abilify, Ambien-nightmares, Lunesta  Past Medical History:  Past Medical History:  Diagnosis Date   Allergy    Anemia    Anxiety    Arthritis    Yes   Blood transfusion without reported diagnosis 1977   Transfusions from miscarriage & hemorrhaging   Chronic kidney disease    Depression    Disp fx of cuboid bone of right foot, init for clos fx 01/01/2017   GERD (gastroesophageal reflux disease)    Headache    Hepatitis C    Hyperlipidemia    Neuromuscular disorder ([place]) 1997  Falling to much was an eye-opener   Osteoporosis    Thyroid disease    Urine incontinence     Past Surgical History:  Procedure Laterality Date   5 miscarriages     1977-1985, Blood transfusion s/p miscarriage 1977   ABDOMINAL HYSTERECTOMY  1987   Total   APPENDECTOMY  12/2008   AUGMENTATION MAMMAPLASTY Bilateral 2015   Bilat   AUGMENTATION MAMMAPLASTY Bilateral 2018   implants redone w/ placement of implants under muscle   breast lift bilateral, implants  60/10/9321   bilateral, silicon naturel   BREAST REDUCTION SURGERY Bilateral 1997   CESAREAN SECTION  07/27/1981   Placenta Previa   CHOLECYSTECTOMY  03/2004   Lap surgery   COLONOSCOPY WITH PROPOFOL N/A 06/13/2020   Procedure: COLONOSCOPY WITH PROPOFOL;  Surgeon: Virgel Manifold, MD;  Location:  [facility] END[facility]COPY;  Service: Endoscopy;  Laterality: N/A;   COSMETIC SURGERY     Breast implants w/ lift, tummy tuck, upper & lower Blepharop   ESOPHAGOGASTRODUODENOSCOPY (EGD) WITH PROPOFOL N/A 06/13/2020   Procedure: ESOPHAGOGASTRODUODENOSCOPY (EGD) WITH PROPOFOL;  Surgeon: Virgel Manifold, MD;  Location: [facility] ENDOSCOPY;  Service: Endoscopy;  Laterality: N/A;   EYE SURGERY  2017   Cataract surgery & lasik   GASTRIC BYPASS  2005   Laparoscopic   IVC FILTER INSERTION  12/2003   TrapEase Vena Cava Filter   LUMBAR LAMINECTOMY  06/2006   L4-L5 (spinal fusion)   mini tummy tuck  05/07/2016   Bilateral bra/back roll lift skin removal   neck surgery C3-C7  02/10/2015   ACDF   REDUCTION MAMMAPLASTY Bilateral 1997   SHOULDER ACROMIOPLASTY Left 03/27/2013   w/ labral debridement   SMALL INTESTINE SURGERY  12/24/2003   Lap Gastric Bypass   SPINAL CORD STIMULATOR IMPLANT  11/2010   removed 05/2011   SPINE SURGERY  2008 2017   Fusion L4-L5, ACDF C4-C7    Family Psychiatric History: Reviewed family psychiatric history from progress note on 05/15/2018  Family History:  Family History  Problem Relation Age of Onset   COPD Mother    Lung cancer Mother    Diabetes Mother    Heart disease Mother    Stroke Mother    Heart attack Mother    Arthritis Mother    Cancer Mother    Hypertension Mother    Obesity Mother    Varicose Veins Mother    Heart disease Father    Heart attack Father 40   Early death Father    Depression Sister        x 5 sisters   COPD Sister    Hypertension Sister    Heart disease Brother        x 3 brothers   Diabetes Brother    Colon polyps Sister    Hearing loss Sister    Heart attack Sister    Heart attack Brother    Arthritis Sister    Varicose Veins Sister    Arthritis Brother    Heart disease Brother    Hypertension Brother    Cancer Maternal Grandfather    Stroke Maternal Grandfather    COPD Sister    Obesity Sister    Depression Sister     Depression Sister    Heart disease Sister    Hypertension Sister    Diabetes Brother    Heart disease Brother    Hypertension Brother    Obesity Brother    Varicose Veins Brother    Heart disease Brother  Hypertension Brother    Obesity Brother    Hypertension Sister    Obesity Sister    Miscarriages / Stillbirths Maternal Aunt    Miscarriages / Stillbirths Paternal Aunt    Breast cancer Neg Hx     Social History: Reviewed social history from progress note on 05/15/2018 Social History   Socioeconomic History   Marital status: Married    Spouse name: Not on file   Number of children: 1   Years of education: High School   Highest education level: Some college, no degree  Occupational History   Occupation: disability  Tobacco Use   Smoking status: Former    Packs/day: 0.25    Years: 10.00    Pack years: 2.50    Types: Cigarettes    Quit date: 11/29/1995    Years since quitting: 25.0   Smokeless tobacco: Former   Tobacco comments:    Quite 1997, didnt smoke hardly at all when I was smoking.  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]   Social Determinants of Radio broadcast assistant Strain: Low Risk    Difficulty of Paying Living Expenses: Not hard at all  Food Insecurity: No Food Insecurity   Worried About Charity fundraiser in the Last Year: Never true   Arboriculturist in the Last Year: Never true  Transportation Needs: No Transportation Needs   Lack of Transportation (Medical): No   Lack of Transportation (Non-Medical): No  Physical Activity: Inactive   Days of Exercise per Week: 0 days   Minutes of  Exercise per Session: 0 min  Stress: Stress Concern Present   Feeling of Stress : Very much  Social Connections: Not on file    Allergies:  Allergies  Allergen Reactions   Flagyl [Metronidazole] Anaphylaxis   Cymbalta [Duloxetine Hcl]    Tape Rash    Metabolic Disorder Labs: Lab Results  Component Value Date   HGBA1C 5.0 03/30/2020   MPG 97 03/30/2020   MPG 97 03/17/2019   No results found for: PROLACTIN Lab Results  Component Value Date   CHOL 152 03/30/2020   TRIG 67 03/30/2020   HDL 89 03/30/2020   CHOLHDL 1.7 03/30/2020   LDLCALC 49 03/30/2020   LDLCALC 42 09/22/2019   Lab Results  Component Value Date   TSH 1.97 03/30/2020   TSH 0.86 08/11/2018    Therapeutic Level Labs: No results found for: LITHIUM No results found for: VALPROATE No components found for:  CBMZ  Current Medications: Current Outpatient Medications  Medication Sig Dispense Refill   cariprazine (VRAYLAR) 1.5 MG capsule Take 1 capsule (1.5 mg total) by mouth daily. 30 capsule 0   amoxicillin-clavulanate (AUGMENTIN) 875-125 MG tablet Take 1 tablet by mouth 2 (two) times daily. For 10 days 20 tablet 0   ASPIRIN LOW DOSE 81 MG EC tablet TAKE 1 TABLET BY MOUTH EVERY DAY 90 tablet 3   butalbital-acetaminophen-caffeine (FIORICET) 50-325-40 MG tablet Take 1 tablet by mouth 2 (two) times daily as needed for headache.     Cholecalciferol 250 MCG (10000 UT) CAPS Take by mouth.  cyanocobalamin (,VITAMIN B-12,) 1000 MCG/ML injection Inject 1,000 mcg into the muscle every 30 (thirty) days.     diclofenac Sodium (VOLTAREN) 1 % GEL Apply 2 g topically 4 (four) times daily.     estradiol (ESTRACE) 1 MG tablet TAKE 1 TABLET EVERY DAY 90 tablet 1   etodolac (LODINE) 500 MG tablet TAKE 1 TABLET BY MOUTH TWICE A DAY 180 tablet 1   ezetimibe (ZETIA) 10 MG tablet TAKE ONE TABLET BY MOUTH ONE TIME DAILY 90 tablet 1   fluticasone (FLONASE) 50 MCG/ACT nasal spray SPRAY 2 SPRAYS INTO EACH NOSTRIL EVERY DAY 48 mL 3    furosemide (LASIX) 20 MG tablet TAKE 1 TABLET EVERY DAY FOR FLUID OR EDEMA AS NEEDED 90 tablet 0   gabapentin (NEURONTIN) 600 MG tablet Take 1 tablet (600 mg total) by mouth 3 (three) times daily. 90 tablet 3   hydrOXYzine (ATARAX/VISTARIL) 25 MG tablet TAKE 1/2 TO 1 TABLET BY MOUTH TWICE A DAY AS NEEDED FOR SEVERE PANIC ATTACKS/ ANXIETY 180 tablet 1   lamoTRIgine (LAMICTAL) 150 MG tablet Take 0.5 tablets (75 mg total) by mouth 2 (two) times daily. 90 tablet 0   mometasone (ELOCON) 0.1 % cream Apply 1 application topically 2 (two) times daily.     ondansetron (ZOFRAN ODT) 4 MG disintegrating tablet Take 1 tablet (4 mg total) by mouth every 8 (eight) hours as needed for nausea or vomiting. 30 tablet 2   OZEMPIC, 1 MG/DOSE, 4 MG/3ML SOPN INJECT 1MG  INTO THE SKIN ONCE A WEEK 3 mL 2   propranolol (INDERAL) 10 MG tablet TAKE 1 TABLET THREE TIMES DAILY AS NEEDED FOR SEVERE ANXIETY ATTACKS (LIMITED SUPPLY) 90 tablet 0   rosuvastatin (CRESTOR) 40 MG tablet TAKE 1 TABLET EVERY DAY 90 tablet 1   tiZANidine (ZANAFLEX) 4 MG tablet Take 4 mg by mouth 4 (four) times daily.     traMADol (ULTRAM) 50 MG tablet Take 50 mg by mouth 4 (four) times daily.     traZODone (DESYREL) 100 MG tablet TAKE 1 TABLET EVERY DAY AT BEDTIME 90 tablet 0   venlafaxine XR (EFFEXOR-XR) 75 MG 24 hr capsule TAKE ONE CAPSULE BY MOUTH DAILY WITH BREAKFAST 90 capsule 1   XIIDRA 5 % SOLN INSTILL 1 DROP INTO BOTH EYES TWICE A DAY     No current facility-administered medications for this visit.     Musculoskeletal: Strength & Muscle Tone:  UTA Gait & Station:  Seated Patient leans: N/A  Psychiatric Specialty Exam: Review of Systems  Musculoskeletal:  Positive for back pain.  Psychiatric/Behavioral:  Positive for dysphoric mood. The patient is nervous/anxious.   All other systems reviewed and are negative.  There were no vitals taken for this visit.There is no height or weight on file to calculate BMI.  General Appearance: Casual   Eye Contact:  Good  Speech:  Clear and Coherent  Volume:  Normal  Mood:  Depressed, anxious, mood swings  Affect:  Congruent  Thought Process:  Goal Directed and Descriptions of Associations: Intact  Orientation:  Full (Time, Place, and Person)  Thought Content: Logical   Suicidal Thoughts:  No  Homicidal Thoughts:  No  Memory:  Immediate;   Fair Recent;   Fair Remote;   Fair  Judgement:  Fair  Insight:  Good  Psychomotor Activity:  Normal  Concentration:  Concentration: Fair and Attention Span: Fair  Recall:  [employer] of Knowledge: Fair  Language: Fair  Akathisia:  No  Handed:  Right  AIMS (  if indicated): done, 0  Assets:  Communication Skills Desire for Improvement Housing Social Support  ADL's:  Intact  Cognition: WNL  Sleep:  Fair   Screenings: GAD-7    [place] Office Visit from 12/28/2019 in [facility] Office Visit from 03/17/2019 in [facility] Office Visit from 11/20/2018 in [facility] Office Visit from 08/18/2018 in [facility] Office Visit from 07/22/2018 in [facility]  Total GAD-7 Score 2 2 4 9 8       PHQ2-9    Flowsheet Row Video Visit from 08/02/2020 in [place] Video Visit from 07/18/2020 in [place] Video Visit from 04/25/2020 in [place] from 03/29/2020 in [facility] Office Visit from 12/28/2019 in [facility]  PHQ-2 Total Score 1 6 2 4  0  PHQ-9 Total Score 10 21 5 9 1       Flowsheet Row Video Visit from 08/02/2020 in [place] Admission (Discharged) from 06/13/2020 in [place] No Risk No Risk        Assessment and Plan: Ashley Fields is a 65 year old Caucasian female who has a history of PTSD, bipolar disorder, panic disorder, migraine  headaches, chronic pain was evaluated by telemedicine today.  Patient is currently struggling with mood swings, depression, anxiety, will benefit from medication readjustment.  She will also benefit from sufficient pain management.  Plan as noted below.  Plan Bipolar disorder mixed moderate-unstable Start Vraylar 1.5 mg p.o. nightly Continue lamotrigine 150 mg p.o. daily in divided dosage Gabapentin 600 mg p.o. 3 times daily-prescribed by primary provider.  Patient advised to take it at the end of the day if she feels sluggish.  PTSD-stable Venlafaxine as prescribed Hydroxyzine 25 mg p.o. daily as needed for severe anxiety attacks  Panic disorder-stable Venlafaxine 75 mg p.o. daily  Insomnia-stable Trazodone 50-100 mg p.o. nightly Patient will continue to benefit from sufficient pain management.  Weight gain likely secondary to medications-unstable Continue lifestyle modification. Patient will benefit from monitoring of her BMI, patient advised to come into the office for monitoring her weight, BMI.  Patient will continue to benefit from sufficient pain management.  She will reach out to her provider.  Follow-up in clinic in 10 days or sooner if needed.  This note was generated in part or whole with voice recognition software. Voice recognition is usually quite accurate but there are transcription errors that can and very often do occur. I apologize for any typographical errors that were not detected and corrected.      Ursula Alert, MD 11/24/2020, 9:22 AM

## 2020-11-23 NOTE — Patient Instructions (Signed)
Cariprazine Capsules What is this medication? CARIPRAZINE (car i PRA zeen) treats schizophrenia and bipolar disorder. It works by balancing the levels of dopamine and serotonin in your brain, substances that help regulate mood, behaviors, and thoughts. It belongs to a group of medications called antipsychotics. Antipsychotic medications can be used to treat several kinds of mental health conditions. This medicine may be used for other purposes; ask your health care provider or pharmacist if you have questions. COMMON BRAND NAME(S): VRAYLAR What should I tell my care team before I take this medication? They need to know if you have any of these conditions: Dementia Diabetes Difficulty swallowing Have trouble controlling your muscles Heart disease High cholesterol History of breast cancer History of stroke Kidney disease Liver disease Low blood counts, like low white cell, platelet, or red cell counts Low blood pressure Parkinson's disease Seizures Suicidal thoughts, plans or attempt; a previous suicide attempt by you or a family member An unusual or allergic reaction to cariprazine, other medications, foods, dyes, or preservatives Pregnant or trying to get pregnant Breast-feeding How should I use this medication? Take this medication by mouth with a glass of water. Follow the directions on the prescription label. You may take it with or without food. Take your medication at regular intervals. Do not take it more often than directed. Do not stop taking except on your care team's advice. A special MedGuide will be given to you by the pharmacist with each prescription and refill. Be sure to read this information carefully each time. Talk to your care team about the use of this medication in children. Special care may be needed. Overdosage: If you think you have taken too much of this medicine contact a poison control center or emergency room at once. NOTE: This medicine is only for you. Do  not share this medicine with others. What if I miss a dose? If you miss a dose, take it as soon as you can. If it is almost time for your next dose, take only that dose. Do not take double or extra doses. What may interact with this medication? Do not take this medication with any of the following: Metoclopramide This medication may also interact with the following: Antihistamines for allergy, cough, and cold Carbamazepine Certain medications for anxiety or sleep Certain medications for depression like amitriptyline, fluoxetine, sertraline Certain medications for fungal infections like itraconazole, ketoconazole General anesthetics like halothane, isoflurane, methoxyflurane, propofol Levodopa or other medications for Parkinson's disease Medications for blood pressure Medications for seizures Medications that relax muscles for surgery Narcotic medications for pain Phenothiazines like chlorpromazine, prochlorperazine, thioridazine Rifampin This list may not describe all possible interactions. Give your health care provider a list of all the medicines, herbs, non-prescription drugs, or dietary supplements you use. Also tell them if you smoke, drink alcohol, or use illegal drugs. Some items may interact with your medicine. What should I watch for while using this medication? Visit your care team for regular checks on your progress. Tell your care team if symptoms do not start to get better or if they get worse. Do not stop taking except on your care team's advice. You may develop a severe reaction. Your care team will tell you how much medication to take. Patients and their families should watch out for new or worsening depression or thoughts of suicide. Also watch out for sudden changes in feelings such as feeling anxious, agitated, panicky, irritable, hostile, aggressive, impulsive, severely restless, overly excited and hyperactive, or not being able to  sleep. If this happens, especially at the  beginning of treatment or after a change in dose, call your care team. You may get dizzy or drowsy. Do not drive, use machinery, or do anything that needs mental alertness until you know how this medication affects you. Do not stand or sit up quickly, especially if you are an older patient. This reduces the risk of dizzy or fainting spells. Alcohol may interfere with the effect of this medication. Avoid alcoholic drinks. This medication may cause dry eyes and blurred vision. If you wear contact lenses you may feel some discomfort. Lubricating drops may help. See your eye doctor if the problem does not go away or is severe. This medication may increase blood sugar. Ask your care team if changes in diet or medications are needed if you have diabetes. This medication can cause problems with controlling your body temperature. It can lower the response of your body to cold temperatures. If possible, stay indoors during cold weather. If you must go outdoors, wear warm clothes. It can also lower the response of your body to heat. Do not overheat. Do not over-exercise. Stay out of the sun when possible. If you must be in the sun, wear cool clothing. Drink plenty of water. If you have trouble controlling your body temperature, call your care team right away. Women should inform their care team if they wish to become pregnant or think they might be pregnant. The effects of this medication on an unborn child are not known. A registry is available to monitor pregnancy outcomes in pregnant women exposed to this medication or similar medications. Talk to your care team or pharmacist for more information. What side effects may I notice from receiving this medication? Side effects that you should report to your care team as soon as possible: Allergic reactions--skin rash, itching, hives, swelling of the face, lips, tongue, or throat High blood sugar (hyperglycemia)--increased thirst or amount of urine, unusual weakness or  fatigue, blurry vision High fever, stiff muscles, increased sweating, fast or irregular heartbeat, and confusion, which may be signs of neuroleptic malignant syndrome Infection--fever, chills, cough, or sore throat Low blood pressure--dizziness, feeling faint or lightheaded, blurry vision Pain or trouble swallowing Seizures Stroke--sudden numbness or weakness of the face, arm, or leg, trouble speaking, confusion, trouble walking, loss of balance or coordination, dizziness, severe headache, change in vision Thoughts of suicide or self-harm, worsening mood, feelings of depression Uncontrolled and repetitive body movements, muscle stiffness or spasms, tremors or shaking, loss of balance or coordination, restlessness, shuffling walk, which may be signs of extrapyramidal symptoms (EPS) Side effects that usually do not require medical attention (report to your care team if they continue or are bothersome): Constipation Drowsiness Nausea Upset stomach Vomiting This list may not describe all possible side effects. Call your doctor for medical advice about side effects. You may report side effects to FDA at 1-800-FDA-1088. Where should I keep my medication? Keep out of the reach of children. Store at room temperature between 15 and 30 degrees C (59 and 86 degrees F). Protect from light. Throw away any unused medication after the expiration date. NOTE: This sheet is a summary. It may not cover all possible information. If you have questions about this medicine, talk to your doctor, pharmacist, or health care provider.  2022 Elsevier/Gold Standard (2020-09-14 00:00:00)

## 2020-11-23 NOTE — Telephone Encounter (Signed)
went online to [email or web address] and submitted the prior auth . - pending 

## 2020-11-24 MED ORDER — QUETIAPINE FUMARATE 25 MG PO TABS: 25.0000 mg | ORAL_TABLET | Freq: Every day | ORAL | 0 refills | Status: DC

## 2020-11-24 NOTE — Telephone Encounter (Signed)
vrayklar was denied. pt needs to try : risperidone, olanzapine, quetiapine or ziprasidone

## 2020-11-24 NOTE — Telephone Encounter (Signed)
Contacted patient, patient okay with starting Seroquel 25 mg p.o. nightly.  Provided medication education including weight gain, metabolic syndrome, EKG changes, tardive dyskinesia. Patient to hold trazodone since Seroquel could also help with sleep.  She could use trazodone as needed.

## 2020-11-25 ENCOUNTER — Other Ambulatory Visit: Payer: Self-pay | Admitting: Psychiatry

## 2020-11-25 DIAGNOSIS — F3176 Bipolar disorder, in full remission, most recent episode depressed: Secondary | ICD-10-CM

## 2020-11-25 DIAGNOSIS — F4310 Post-traumatic stress disorder, unspecified: Secondary | ICD-10-CM

## 2020-11-25 DIAGNOSIS — F410 Panic disorder [episodic paroxysmal anxiety] without agoraphobia: Secondary | ICD-10-CM

## 2020-12-04 ENCOUNTER — Other Ambulatory Visit: Payer: Self-pay | Admitting: Family Medicine

## 2020-12-04 DIAGNOSIS — Z8249 Family history of ischemic heart disease and other diseases of the circulatory system: Secondary | ICD-10-CM

## 2020-12-04 DIAGNOSIS — I25118 Atherosclerotic heart disease of native coronary artery with other forms of angina pectoris: Secondary | ICD-10-CM

## 2020-12-05 NOTE — Telephone Encounter (Signed)
Requested Prescriptions  Pending Prescriptions Disp Refills  . OZEMPIC, 1 MG/DOSE, 4 MG/3ML SOPN [Pharmacy Med Name: OZEMPIC 1 MG/DOSE (4 MG/3 ML)] 3 mL 2    Sig: INJECT 1MG  INTO THE SKIN ONCE A WEEK     Endocrinology:  Diabetes - GLP-1 Receptor Agonists Failed - 12/04/2020  8:56 AM      Failed - HBA1C is between 0 and 7.9 and within 180 days    Hgb A1c MFr Bld  Date Value Ref Range Status  03/30/2020 5.0 <5.7 % of total Hgb Final    Comment:    For the purpose of screening for the presence of diabetes: . <5.7%       Consistent with the absence of diabetes 5.7-6.4%    Consistent with increased risk for diabetes             (prediabetes) > or =6.5%  Consistent with diabetes . This assay result is consistent with a decreased risk of diabetes. . Currently, no consensus exists regarding use of hemoglobin A1c for diagnosis of diabetes in children. . According to American Diabetes Association (ADA) guidelines, hemoglobin A1c <7.0% represents optimal control in non-pregnant diabetic patients. Different metrics may apply to specific patient populations.  Standards of Medical Care in Diabetes(ADA). Renella Cunas - Valid encounter within last 6 months    Recent Outpatient Visits          1 month ago Acute non-recurrent frontal sinusitis   [place], DO   7 months ago Annual physical exam   [facility] Olin Hauser, DO   10 months ago Acute viral pharyngitis   [facility] Olin Hauser, [place]   11 months ago Morbid obesity [facility])   [place], DO   11 months ago Acute pain of left knee   [place], DO      Future Appointments            In 3 days Virgel Manifold, MD [place]   In 1 month Gollan, Kathlene November, MD [facility], [place]   In 4 months  [facility], [place]

## 2020-12-07 ENCOUNTER — Telehealth: Payer: 59 | Admitting: Psychiatry

## 2020-12-07 ENCOUNTER — Other Ambulatory Visit: Payer: Self-pay

## 2020-12-08 ENCOUNTER — Ambulatory Visit: Payer: 59 | Admitting: Gastroenterology

## 2020-12-12 ENCOUNTER — Ambulatory Visit (INDEPENDENT_AMBULATORY_CARE_PROVIDER_SITE_OTHER): Payer: 59 | Admitting: Family Medicine

## 2020-12-12 ENCOUNTER — Other Ambulatory Visit: Payer: Self-pay

## 2020-12-12 ENCOUNTER — Encounter: Payer: Self-pay | Admitting: Family Medicine

## 2020-12-12 VITALS — BP 129/51 | HR 86 | Ht 62.0 in | Wt 206.2 lb

## 2020-12-12 DIAGNOSIS — M5441 Lumbago with sciatica, right side: Secondary | ICD-10-CM

## 2020-12-12 MED ORDER — PREDNISONE 20 MG PO TABS: ORAL_TABLET | ORAL | 0 refills | Status: DC

## 2020-12-12 NOTE — Progress Notes (Signed)
Subjective:    Patient ID: Ashley Fields, female    DOB: [birth year], 65 y.o.   MRN: [id number]  Ashley Fields is a 65 y.o. female presenting on 12/12/2020 for Hip Pain   HPI  Right Hip / Low Back Pain Sciatica Spinal Stenosis, neurogenic symptoms OA/DJD She says she had issue with both SI joints were "out" required injections Reports symptoms onset 2 weeks ago, worsening with R low back symptoms radiating down into back of leg and down into thigh and lower leg with burning into foot. She has tried rest and recliner, heating pad. She says needs to be better to help her husband with his shoulder has upcoming surgery.  Followed by [facility] Claris Pong NP for pain management, she has done injections as well and pain management.   Depression screen [facility] 2/9 03/29/2020 12/28/2019 09/21/2019  Decreased Interest 1 0 0  Down, Depressed, Hopeless 3 0 0  PHQ - 2 Score 4 0 0  Altered sleeping 2 1 0  Tired, decreased energy 3 0 0  Change in appetite 0 0 0  Feeling bad or failure about yourself  0 0 0  Trouble concentrating 0 0 0  Moving slowly or fidgety/restless 0 0 0  Suicidal thoughts 0 0 0  PHQ-9 Score 9 1 0  Difficult doing work/chores Somewhat difficult Not difficult at all Not difficult at all  Some encounter information is confidential and restricted. Go to Review Flowsheets activity to see all data.  Some recent data might be hidden    Social History   Tobacco Use   Smoking status: Former    Packs/day: 0.25    Years: 10.00    Pack years: 2.50    Types: Cigarettes    Quit date: 11/29/1995    Years since quitting: 25.0   Smokeless tobacco: Former   Tobacco comments:    Quite 1997, didnt smoke hardly at all when I was smoking.  Vaping Use   Vaping Use: Never used  Substance Use Topics   Alcohol use: No   Drug use: No    Review of Systems Per HPI unless specifically indicated above     Objective:    BP (!) 129/51   Pulse 86   Ht 5\' 2"  (1.575 m)   Wt 206 lb  3.2 oz (93.5 kg)   SpO2 100%   BMI 37.71 kg/m   Wt Readings from Last 3 Encounters:  12/12/20 206 lb 3.2 oz (93.5 kg)  10/25/20 197 lb 3.2 oz (89.4 kg)  09/06/20 198 lb 6.4 oz (90 kg)    Physical Exam Vitals and nursing note reviewed.  Constitutional:      General: She is not in acute distress.    Appearance: Normal appearance. She is well-developed. She is not diaphoretic.     Comments: Well-appearing, comfortable, cooperative  HENT:     Head: Normocephalic and atraumatic.  Eyes:     General:        Right eye: No discharge.        Left eye: No discharge.     Conjunctiva/sclera: Conjunctivae normal.  Cardiovascular:     Rate and Rhythm: Normal rate.  Pulmonary:     Effort: Pulmonary effort is normal.  Musculoskeletal:     Comments: Patient using cane to assist with ambulation, has antalgic gait R hip.  Skin:    General: Skin is warm and dry.     Findings: No erythema or rash.  Neurological:     Mental  Status: She is alert and oriented to person, place, and time.  Psychiatric:        Mood and Affect: Mood normal.        Behavior: Behavior normal.        Thought Content: Thought content normal.     Comments: Well groomed, good eye contact, normal speech and thoughts   Results for orders placed or performed in visit on 09/02/20  Iron and TIBC  Result Value Ref Range   Iron 100 28 - 170 ug/dL   TIBC 444 250 - 450 ug/dL   Saturation Ratios 23 10.4 - 31.8 %   UIBC 344 ug/dL  Ferritin  Result Value Ref Range   Ferritin 15 11 - 307 ng/mL  Basic metabolic panel  Result Value Ref Range   Sodium 139 135 - 145 mmol/L   Potassium 4.2 3.5 - 5.1 mmol/L   Chloride 103 98 - 111 mmol/L   CO2 29 22 - 32 mmol/L   Glucose, Bld 89 70 - 99 mg/dL   BUN 14 8 - 23 mg/dL   Creatinine, Ser 0.78 0.44 - 1.00 mg/dL   Calcium 9.2 8.9 - 10.3 mg/dL   GFR, Estimated >60 >60 mL/min   Anion gap 7 5 - 15  CBC with Differential  Result Value Ref Range   WBC 5.2 4.0 - 10.5 K/uL   RBC 4.33  3.87 - 5.11 MIL/uL   Hemoglobin 13.1 12.0 - 15.0 g/dL   HCT 40.9 36.0 - 46.0 %   MCV 94.5 80.0 - 100.0 fL   MCH 30.3 26.0 - 34.0 pg   MCHC 32.0 30.0 - 36.0 g/dL   RDW 12.4 11.5 - 15.5 %   Platelets 152 150 - 400 K/uL   nRBC 0.0 0.0 - 0.2 %   Neutrophils Relative % 65 %   Neutro Abs 3.3 1.7 - 7.7 K/uL   Lymphocytes Relative 24 %   Lymphs Abs 1.2 0.7 - 4.0 K/uL   Monocytes Relative 8 %   Monocytes Absolute 0.4 0.1 - 1.0 K/uL   Eosinophils Relative 3 %   Eosinophils Absolute 0.2 0.0 - 0.5 K/uL   Basophils Relative 0 %   Basophils Absolute 0.0 0.0 - 0.1 K/uL   Immature Granulocytes 0 %   Abs Immature Granulocytes 0.01 0.00 - 0.07 K/uL      Assessment & Plan:   Problem List Items Addressed This Visit   None Visit Diagnoses     Acute right-sided low back pain with right-sided sciatica    -  Primary   Relevant Medications   predniSONE (DELTASONE) 20 MG tablet       Clinically consistent with acute on chronic Low back R hip pain flare with sciatica down into R leg to foot. She has extensive known OA/DJD spinal stenosis joint pain problems. Followed already by [facility], on extensive regimen already with NSAID, Tizanidine, Gabapentin, Tramadol. Limited results on these meds. Already receiving injections SI joint previously other locations.  I advised her that there is not much else I can offer, and her pain is very characteristic of Sciatica.  Will trial a longer Prednisone oral taper course 12 days, and she should return to her Pain / ortho specialist next my assessment supports this as likely diagnosis   Meds ordered this encounter  Medications   predniSONE (DELTASONE) 20 MG tablet    Sig: Take 2 tablets daily (40mg ) for 4 days, take 1 tab daily (20mg ) for 4 days,  take half tab daily (10mg ) for 4 days    Dispense:  14 tablet    Refill:  0    Follow up plan: Return if symptoms worsen or fail to improve.   Nobie Putnam, [facility] 12/12/2020, 4:17 PM

## 2020-12-12 NOTE — Patient Instructions (Addendum)
Thank you for coming to the office today.  Likely Sciatica  Start Prednisone taper over 12 days. If not improving contact your [facility] office for further advice.  Hold aleve and Etolodac right now on prednisone, can restart after  Consider in future switch Gabapentin to Lyrica (Pregabalin) - may need to discuss with Allyson Sabal first as it is a controlled.   Sciatica Rehab Ask your health care provider which exercises are safe for you. Do exercises exactly as told by your health care provider and adjust them as directed. It is normal to feel mild stretching, pulling, tightness, or discomfort as you do these exercises. Stop right away if you feel sudden pain or your pain gets worse. Do not begin these exercises until told by your health care provider. Stretching and range-of-motion exercises These exercises warm up your muscles and joints and improve the movement and flexibility of your hips and back. These exercises also help to relieve pain, numbness, and tingling. Sciatic nerve glide Sit in a chair with your head facing down toward your chest. Place your hands behind your back. Let your shoulders slump forward. Slowly straighten one of your legs while you tilt your head back as if you are looking toward the ceiling. Only straighten your leg as far as you can without making your symptoms worse. Hold this position for __________ seconds. Slowly return to the starting position. Repeat with your other leg. Repeat __________ times. Complete this exercise __________ times a day. Knee to chest with hip adduction and internal rotation  Lie on your back on a firm surface with both legs straight. Bend one of your knees and move it up toward your chest until you feel a gentle stretch in your lower back and buttock. Then, move your knee toward the shoulder that is on the opposite side from your leg. This is hip adduction and internal rotation. Hold your leg in this position by holding on to  the front of your knee. Hold this position for __________ seconds. Slowly return to the starting position. Repeat with your other leg. Repeat __________ times. Complete this exercise __________ times a day. Prone extension on elbows  Lie on your abdomen on a firm surface. A bed may be too soft for this exercise. Prop yourself up on your elbows. Use your arms to help lift your chest up until you feel a gentle stretch in your abdomen and your lower back. This will place some of your body weight on your elbows. If this is uncomfortable, try stacking pillows under your chest. Your hips should stay down, against the surface that you are lying on. Keep your hip and back muscles relaxed. Hold this position for __________ seconds. Slowly relax your upper body and return to the starting position. Repeat __________ times. Complete this exercise __________ times a day. Strengthening exercises These exercises build strength and endurance in your back. Endurance is the ability to use your muscles for a long time, even after they get tired. Pelvic tilt This exercise strengthens the muscles that lie deep in the abdomen. Lie on your back on a firm surface. Bend your knees and keep your feet flat on the floor. Tense your abdominal muscles. Tip your pelvis up toward the ceiling and flatten your lower back into the floor. To help with this exercise, you may place a small towel under your lower back and try to push your back into the towel. Hold this position for __________ seconds. Let your muscles relax completely before  you repeat this exercise. Repeat __________ times. Complete this exercise __________ times a day. Alternating arm and leg raises  Get on your hands and knees on a firm surface. If you are on a hard floor, you may want to use padding, such as an exercise mat, to cushion your knees. Line up your arms and legs. Your hands should be directly below your shoulders, and your knees should be  directly below your hips. Lift your left leg behind you. At the same time, raise your right arm and straighten it in front of you. Do not lift your leg higher than your hip. Do not lift your arm higher than your shoulder. Keep your abdominal and back muscles tight. Keep your hips facing the ground. Do not arch your back. Keep your balance carefully, and do not hold your breath. Hold this position for __________ seconds. Slowly return to the starting position. Repeat with your right leg and your left arm. Repeat __________ times. Complete this exercise __________ times a day. Posture and body mechanics Good posture and healthy body mechanics can help to relieve stress in your body's tissues and joints. Body mechanics refers to the movements and positions of your body while you do your daily activities. Posture is part of body mechanics. Good posture means: Your spine is in its natural S-curve position (neutral). Your shoulders are pulled back slightly. Your head is not tipped forward. Follow these guidelines to improve your posture and body mechanics in your everyday activities. Standing  When standing, keep your spine neutral and your feet about hip width apart. Keep a slight bend in your knees. Your ears, shoulders, and hips should line up. When you do a task in which you stand in one place for a long time, place one foot up on a stable object that is 2-4 inches (5-10 cm) high, such as a footstool. This helps keep your spine neutral. Sitting  When sitting, keep your spine neutral and keep your feet flat on the floor. Use a footrest, if necessary, and keep your thighs parallel to the floor. Avoid rounding your shoulders, and avoid tilting your head forward. When working at a desk or a computer, keep your desk at a height where your hands are slightly lower than your elbows. Slide your chair under your desk so you are close enough to maintain good posture. When working at a computer, place  your monitor at a height where you are looking straight ahead and you do not have to tilt your head forward or downward to look at the screen. Resting When lying down and resting, avoid positions that are most painful for you. If you have pain with activities such as sitting, bending, stooping, or squatting, lie in a position in which your body does not bend very much. For example, avoid curling up on your side with your arms and knees near your chest (fetal position). If you have pain with activities such as standing for a long time or reaching with your arms, lie with your spine in a neutral position and bend your knees slightly. Try the following positions: Lying on your side with a pillow between your knees. Lying on your back with a pillow under your knees. Lifting  When lifting objects, keep your feet at least shoulder width apart and tighten your abdominal muscles. Bend your knees and hips and keep your spine neutral. It is important to lift using the strength of your legs, not your back. Do not lock your knees straight  out. Always ask for help to lift heavy or awkward objects. This information is not intended to replace advice given to you by your health care provider. Make sure you discuss any questions you have with your health care provider. Document Revised: 04/18/2018 Document Reviewed: 01/16/2018 Elsevier Patient Education  [place].    Please schedule a Follow-up Appointment to: Return if symptoms worsen or fail to improve.  If you have any other questions or concerns, please feel free to call the office or send a message through [place]. You may also schedule an earlier appointment if necessary.  Additionally, you may be receiving a survey about your experience at our office within a few days to 1 week by e-mail or mail. We value your feedback.  Nobie Putnam, DO [place]

## 2020-12-18 ENCOUNTER — Other Ambulatory Visit: Payer: Self-pay | Admitting: Psychiatry

## 2020-12-18 DIAGNOSIS — F3161 Bipolar disorder, current episode mixed, mild: Secondary | ICD-10-CM

## 2020-12-19 ENCOUNTER — Ambulatory Visit: Payer: 59 | Admitting: Family Medicine

## 2021-01-04 ENCOUNTER — Telehealth (INDEPENDENT_AMBULATORY_CARE_PROVIDER_SITE_OTHER): Payer: 59 | Admitting: Psychiatry

## 2021-01-04 ENCOUNTER — Encounter: Payer: 59 | Admitting: Family Medicine

## 2021-01-04 ENCOUNTER — Other Ambulatory Visit: Payer: Self-pay

## 2021-01-04 ENCOUNTER — Encounter: Payer: Self-pay | Admitting: Psychiatry

## 2021-01-04 DIAGNOSIS — F3162 Bipolar disorder, current episode mixed, moderate: Secondary | ICD-10-CM | POA: Insufficient documentation

## 2021-01-04 DIAGNOSIS — R635 Abnormal weight gain: Secondary | ICD-10-CM

## 2021-01-04 DIAGNOSIS — Z79899 Other long term (current) drug therapy: Secondary | ICD-10-CM

## 2021-01-04 DIAGNOSIS — F4310 Post-traumatic stress disorder, unspecified: Secondary | ICD-10-CM

## 2021-01-04 DIAGNOSIS — F410 Panic disorder [episodic paroxysmal anxiety] without agoraphobia: Secondary | ICD-10-CM

## 2021-01-04 DIAGNOSIS — G4701 Insomnia due to medical condition: Secondary | ICD-10-CM | POA: Diagnosis not present

## 2021-01-04 MED ORDER — RISPERIDONE 0.25 MG PO TABS: 0.2500 mg | ORAL_TABLET | Freq: Every day | ORAL | 1 refills | Status: DC

## 2021-01-04 NOTE — Progress Notes (Signed)
Virtual Visit via Video Note  I connected with Ashley Fields on 01/04/21 at 10:30 AM EST by a video enabled telemedicine application and verified that I am speaking with the correct person using two identifiers.  Location Provider Location : [facility] Patient Location : Home  Participants: Patient , Provider    I discussed the limitations of evaluation and management by telemedicine and the availability of in person appointments. The patient expressed understanding and agreed to proceed.  I discussed the assessment and treatment plan with the patient. The patient was provided an opportunity to ask questions and all were answered. The patient agreed with the plan and demonstrated an understanding of the instructions.   The patient was advised to call back or seek an in-person evaluation if the symptoms worsen or if the condition fails to improve as anticipated.   [place] MD OP Progress Note  01/04/2021 10:52 AM Ashley Fields  MRN:  [id number]  Chief Complaint:  Chief Complaint   Follow-up; Anxiety; Depression    HPI: Ashley Fields is a 65 year old Caucasian female, married, on disability, lives in [place], has a history of bipolar disorder, PTSD, chronic pain, migraine headaches, history of laparoscopic gastric bypass was evaluated by telemedicine today.  Patient today reports she continues to struggle with mood lability, irritability, anxiety, low frustration tolerance.  She reports she did not tolerate the Seroquel.  She tried the Seroquel for a week however her irritability and anger issues started getting worse and she had to stop taking it.  She reports she felt better after she stopped the Seroquel.  She is interested in a change of medication.  Discussed her risperidone, she has not tried it before.  She is interested in a trial.  Patient reports sleep was okay on the trazodone.  She is currently back on it.  Patient denies any suicidality, homicidality or perceptual disturbances.  Patient  denies any other concerns today.  Visit Diagnosis:    ICD-10-CM   1. Bipolar 1 disorder, mixed, moderate (HCC)  F31.62 Prolactin    risperiDONE (RISPERDAL) 0.25 MG tablet    2. PTSD (post-traumatic stress disorder)  F43.10 risperiDONE (RISPERDAL) 0.25 MG tablet    3. Panic disorder  F41.0     4. Insomnia due to medical condition  G47.01    pain, mood    5. Weight gain  R63.5    likely secondary to psychotropics    6. High risk medication use  Z79.899 Prolactin      Past Psychiatric History: Reviewed past psychiatric history from progress note on 05/15/2018.  Past trials of Prozac, Wellbutrin, Lexapro, Cymbalta, Rexulti, Abilify, Ambien-nightmares, Lunesta  Past Medical History:  Past Medical History:  Diagnosis Date   Allergy    Anemia    Anxiety    Arthritis    Yes   Blood transfusion without reported diagnosis 1977   Transfusions from miscarriage & hemorrhaging   Chronic kidney disease    Depression    Disp fx of cuboid bone of right foot, init for clos fx 01/01/2017   GERD (gastroesophageal reflux disease)    Headache    Hepatitis C    Hyperlipidemia    Neuromuscular disorder ([place]) 1997   Falling to much was an eye-opener   Osteoporosis    Thyroid disease    Urine incontinence     Past Surgical History:  Procedure Laterality Date   5 miscarriages     1977-1985, Blood transfusion s/p miscarriage [place]  Total   APPENDECTOMY  12/2008   AUGMENTATION MAMMAPLASTY Bilateral 2015   Bilat   AUGMENTATION MAMMAPLASTY Bilateral 2018   implants redone w/ placement of implants under muscle   breast lift bilateral, implants  33/54/5625   bilateral, silicon naturel   BREAST REDUCTION SURGERY Bilateral 1997   CESAREAN SECTION  07/27/1981   Placenta Previa   CHOLECYSTECTOMY  03/2004   Lap surgery   COLONOSCOPY WITH PROPOFOL N/A 06/13/2020   Procedure: COLONOSCOPY WITH PROPOFOL;  Surgeon: Virgel Manifold, MD;  Location: [facility] ENDOSCOPY;   Service: Endoscopy;  Laterality: N/A;   COSMETIC SURGERY     Breast implants w/ lift, tummy tuck, upper & lower Blepharop   ESOPHAGOGASTRODUODENOSCOPY (EGD) WITH PROPOFOL N/A 06/13/2020   Procedure: ESOPHAGOGASTRODUODENOSCOPY (EGD) WITH PROPOFOL;  Surgeon: Virgel Manifold, MD;  Location: [facility] ENDOSCOPY;  Service: Endoscopy;  Laterality: N/A;   EYE SURGERY  2017   Cataract surgery & lasik   GASTRIC BYPASS  2005   Laparoscopic   IVC FILTER INSERTION  12/2003   TrapEase Vena Cava Filter   LUMBAR LAMINECTOMY  06/2006   L4-L5 (spinal fusion)   mini tummy tuck  05/07/2016   Bilateral bra/back roll lift skin removal   neck surgery C3-C7  02/10/2015   ACDF   REDUCTION MAMMAPLASTY Bilateral 1997   SHOULDER ACROMIOPLASTY Left 03/27/2013   w/ labral debridement   SMALL INTESTINE SURGERY  12/24/2003   Lap Gastric Bypass   SPINAL CORD STIMULATOR IMPLANT  11/2010   removed 05/2011   SPINE SURGERY  2008 2017   Fusion L4-L5, ACDF C4-C7    Family Psychiatric History: Reviewed family psychiatric history from progress note on 05/15/2018  Family History:  Family History  Problem Relation Age of Onset   COPD Mother    Lung cancer Mother    Diabetes Mother    Heart disease Mother    Stroke Mother    Heart attack Mother    Arthritis Mother    Cancer Mother    Hypertension Mother    Obesity Mother    Varicose Veins Mother    Heart disease Father    Heart attack Father 28   Early death Father    Depression Sister        x 81 sisters   COPD Sister    Hypertension Sister    Heart disease Brother        x 3 brothers   Diabetes Brother    Colon polyps Sister    Hearing loss Sister    Heart attack Sister    Heart attack Brother    Arthritis Sister    Varicose Veins Sister    Arthritis Brother    Heart disease Brother    Hypertension Brother    Cancer Maternal Grandfather    Stroke Maternal Grandfather    COPD Sister    Obesity Sister    Depression Sister    Depression Sister     Heart disease Sister    Hypertension Sister    Diabetes Brother    Heart disease Brother    Hypertension Brother    Obesity Brother    Varicose Veins Brother    Heart disease Brother    Hypertension Brother    Obesity Brother    Hypertension Sister    Obesity Sister    Miscarriages / Stillbirths Maternal Aunt    Miscarriages / Stillbirths Paternal Aunt    Breast cancer Neg Hx     Social History: Reviewed social history  from progress note on 05/15/2018 Social History   Socioeconomic History   Marital status: Married    Spouse name: Not on file   Number of children: 1   Years of education: High School   Highest education level: Some college, no degree  Occupational History   Occupation: disability  Tobacco Use   Smoking status: Former    Packs/day: 0.25    Years: 10.00    Pack years: 2.50    Types: Cigarettes    Quit date: 11/29/1995    Years since quitting: 25.1   Smokeless tobacco: Former   Tobacco comments:    Quite 1997, didnt smoke hardly at all when I was smoking.  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]   Social Determinants of Radio broadcast assistant Strain: Low Risk    Difficulty of Paying Living Expenses: Not hard at all  Food Insecurity: No Food Insecurity   Worried About Charity fundraiser in the Last Year: Never true   Arboriculturist in the Last Year: Never true  Transportation Needs: No Transportation Needs   Lack of Transportation (Medical): No   Lack of Transportation (Non-Medical): No  Physical Activity: Inactive   Days of Exercise per Week: 0 days   Minutes of Exercise per Session: 0 min   Stress: Stress Concern Present   Feeling of Stress : Very much  Social Connections: Not on file    Allergies:  Allergies  Allergen Reactions   Flagyl [Metronidazole] Anaphylaxis   Cymbalta [Duloxetine Hcl]    Tape Rash    Metabolic Disorder Labs: Lab Results  Component Value Date   HGBA1C 5.0 03/30/2020   MPG 97 03/30/2020   MPG 97 03/17/2019   No results found for: PROLACTIN Lab Results  Component Value Date   CHOL 152 03/30/2020   TRIG 67 03/30/2020   HDL 89 03/30/2020   CHOLHDL 1.7 03/30/2020   LDLCALC 49 03/30/2020   LDLCALC 42 09/22/2019   Lab Results  Component Value Date   TSH 1.97 03/30/2020   TSH 0.86 08/11/2018    Therapeutic Level Labs: No results found for: LITHIUM No results found for: VALPROATE No components found for:  CBMZ  Current Medications: Current Outpatient Medications  Medication Sig Dispense Refill   risperiDONE (RISPERDAL) 0.25 MG tablet Take 1 tablet (0.25 mg total) by mouth at bedtime. 15 tablet 1   ASPIRIN LOW DOSE 81 MG EC tablet TAKE 1 TABLET BY MOUTH EVERY DAY 90 tablet 3   butalbital-acetaminophen-caffeine (FIORICET) 50-325-40 MG tablet Take 1 tablet by mouth 2 (two) times daily as needed for headache.     Cholecalciferol 250 MCG (10000 UT) CAPS Take by mouth.     cyanocobalamin (,VITAMIN B-12,) 1000 MCG/ML injection Inject 1,000 mcg into the muscle every 30 (thirty) days.     diclofenac Sodium (VOLTAREN) 1 % GEL Apply 2 g topically 4 (four) times daily.     estradiol (ESTRACE) 1 MG tablet TAKE 1 TABLET EVERY DAY 90 tablet 1   etodolac (LODINE) 500 MG tablet TAKE 1 TABLET BY MOUTH TWICE A DAY 180 tablet  1   ezetimibe (ZETIA) 10 MG tablet TAKE ONE TABLET BY MOUTH ONE TIME DAILY 90 tablet 1   fluticasone (FLONASE) 50 MCG/ACT nasal spray SPRAY 2 SPRAYS INTO EACH NOSTRIL EVERY DAY 48 mL 3   furosemide (LASIX) 20 MG tablet TAKE 1 TABLET EVERY DAY FOR FLUID OR EDEMA AS NEEDED 90 tablet 0   gabapentin (NEURONTIN) 600 MG tablet Take 1  tablet (600 mg total) by mouth 3 (three) times daily. 90 tablet 3   hydrOXYzine (ATARAX/VISTARIL) 25 MG tablet TAKE 1/2 TO 1 TABLET BY MOUTH TWICE A DAY AS NEEDED FOR SEVERE PANIC ATTACKS/ ANXIETY 180 tablet 1   lamoTRIgine (LAMICTAL) 150 MG tablet TAKE 1/2 TABLET TWICE DAILY 90 tablet 0   mometasone (ELOCON) 0.1 % cream Apply 1 application topically 2 (two) times daily.     ondansetron (ZOFRAN ODT) 4 MG disintegrating tablet Take 1 tablet (4 mg total) by mouth every 8 (eight) hours as needed for nausea or vomiting. 30 tablet 2   OZEMPIC, 1 MG/DOSE, 4 MG/3ML SOPN INJECT 1MG  INTO THE SKIN ONCE A WEEK 3 mL 2   predniSONE (DELTASONE) 20 MG tablet Take 2 tablets daily (40mg ) for 4 days, take 1 tab daily (20mg ) for 4 days, take half tab daily (10mg ) for 4 days 14 tablet 0   propranolol (INDERAL) 10 MG tablet TAKE 1 TABLET THREE TIMES DAILY AS NEEDED FOR SEVERE ANXIETY ATTACKS (LIMITED SUPPLY) 90 tablet 0   rosuvastatin (CRESTOR) 40 MG tablet TAKE 1 TABLET EVERY DAY 90 tablet 1   tiZANidine (ZANAFLEX) 4 MG tablet Take 4 mg by mouth 4 (four) times daily.     traMADol (ULTRAM) 50 MG tablet Take 50 mg by mouth 4 (four) times daily.     traZODone (DESYREL) 100 MG tablet TAKE 1 TABLET EVERY DAY AT BEDTIME 90 tablet 0   venlafaxine XR (EFFEXOR-XR) 75 MG 24 hr capsule TAKE ONE CAPSULE BY MOUTH DAILY WITH BREAKFAST 90 capsule 1   XIIDRA 5 % SOLN INSTILL 1 DROP INTO BOTH EYES TWICE A DAY     No current facility-administered medications for this visit.     Musculoskeletal: Strength & Muscle Tone:  UTA Gait & Station:  Seated Patient leans: N/A  Psychiatric Specialty Exam: Review of Systems  Musculoskeletal:  Positive for back pain (chronic).  Psychiatric/Behavioral:  Positive for dysphoric mood. The patient is nervous/anxious.   All other systems reviewed and are negative.  There were no vitals taken for this visit.There is no height or weight on file to calculate BMI.  General Appearance: Casual  Eye  Contact:  Fair  Speech:  Clear and Coherent  Volume:  Normal  Mood:  Anxious, Depressed, and Irritable  Affect:  Congruent  Thought Process:  Goal Directed and Descriptions of Associations: Intact  Orientation:  Full (Time, Place, and Person)  Thought Content: Logical   Suicidal Thoughts:  No  Homicidal Thoughts:  No  Memory:  Immediate;   Fair Recent;   Fair Remote;   Fair  Judgement:  Fair  Insight:  Fair  Psychomotor Activity:  Normal  Concentration:  Concentration: Fair and Attention Span: Fair  Recall:  [employer] of Knowledge: Fair  Language: Fair  Akathisia:  No  Handed:  Right  AIMS (if indicated): done, 0  Assets:  Communication Skills Desire for Improvement Housing Social Support  ADL's:  Intact  Cognition: WNL  Sleep:   improving   Screenings: GAD-7    Flowsheet Row Office Visit from 12/28/2019  in [facility] Office Visit from 03/17/2019 in [facility] Office Visit from 11/20/2018 in [facility] Office Visit from 08/18/2018 in [facility] Office Visit from 07/22/2018 in [facility]  Total GAD-7 Score 2 2 4 9 8       PHQ2-9    Flowsheet Row Video Visit from 08/02/2020 in [place] Video Visit from 07/18/2020 in [place] Video Visit from 04/25/2020 in [place] from 03/29/2020 in [facility] Office Visit from 12/28/2019 in [facility]  PHQ-2 Total Score 1 6 2 4  0  PHQ-9 Total Score 10 21 5 9 1       Flowsheet Row Video Visit from 08/02/2020 in [place] Admission (Discharged) from 06/13/2020 in [place] No Risk No Risk        Assessment and Plan: Ashley Fields is a 65 year old Caucasian female who has a history of PTSD, bipolar disorder, panic disorder, migraine  headaches, chronic pain was evaluated by telemedicine today.  Patient is currently struggling with mood lability, anxiety, will benefit from medication changes.  Patient with adverse side effects to Seroquel.  Plan as noted below.  Plan Bipolar disorder mixed moderate-unstable Discontinue Seroquel due to side effects Start risperidone 0.25 mg p.o. nightly Lamictal 150 mg p.o. daily in divided dosage Gabapentin 600 mg p.o. 3 times daily-prescribed by primary care provider.  Patient advised to take it at the end of the day if she feels sluggish  PTSD-stable Venlafaxine 75 mg p.o. daily Hydroxyzine 25 mg p.o. daily as needed for severe anxiety attacks  Panic disorder-stable Venlafaxine 75 mg p.o. daily  Insomnia-stable Trazodone 50-100 mg p.o. nightly.  Patient will benefit from sufficient pain management  Weight gain likely secondary to medications-unstable Continue lifestyle modification.  High risk medication use-will order prolactin level.  Patient agrees to get it done.  Follow-up in clinic in 2 weeks or sooner if needed.  This note was generated in part or whole with voice recognition software. Voice recognition is usually quite accurate but there are transcription errors that can and very often do occur. I apologize for any typographical errors that were not detected and corrected.        Ursula Alert, MD 01/04/2021, 10:52 AM

## 2021-01-09 DIAGNOSIS — M545 Low back pain, unspecified: Secondary | ICD-10-CM | POA: Diagnosis not present

## 2021-01-11 DIAGNOSIS — M461 Sacroiliitis, not elsewhere classified: Secondary | ICD-10-CM | POA: Diagnosis not present

## 2021-01-17 DIAGNOSIS — Z79891 Long term (current) use of opiate analgesic: Secondary | ICD-10-CM | POA: Diagnosis not present

## 2021-01-17 DIAGNOSIS — Z79899 Other long term (current) drug therapy: Secondary | ICD-10-CM | POA: Diagnosis not present

## 2021-01-17 DIAGNOSIS — M479 Spondylosis, unspecified: Secondary | ICD-10-CM | POA: Diagnosis not present

## 2021-01-17 DIAGNOSIS — M48061 Spinal stenosis, lumbar region without neurogenic claudication: Secondary | ICD-10-CM | POA: Diagnosis not present

## 2021-01-17 DIAGNOSIS — M4726 Other spondylosis with radiculopathy, lumbar region: Secondary | ICD-10-CM | POA: Diagnosis not present

## 2021-01-17 DIAGNOSIS — M25552 Pain in left hip: Secondary | ICD-10-CM | POA: Diagnosis not present

## 2021-01-17 DIAGNOSIS — M542 Cervicalgia: Secondary | ICD-10-CM | POA: Diagnosis not present

## 2021-01-17 DIAGNOSIS — G894 Chronic pain syndrome: Secondary | ICD-10-CM | POA: Diagnosis not present

## 2021-01-17 DIAGNOSIS — M533 Sacrococcygeal disorders, not elsewhere classified: Secondary | ICD-10-CM | POA: Diagnosis not present

## 2021-01-19 ENCOUNTER — Encounter: Payer: Self-pay | Admitting: Internal Medicine

## 2021-01-19 ENCOUNTER — Other Ambulatory Visit: Payer: Self-pay | Admitting: Psychiatry

## 2021-01-19 DIAGNOSIS — F410 Panic disorder [episodic paroxysmal anxiety] without agoraphobia: Secondary | ICD-10-CM

## 2021-01-19 DIAGNOSIS — F4310 Post-traumatic stress disorder, unspecified: Secondary | ICD-10-CM

## 2021-01-19 DIAGNOSIS — F3176 Bipolar disorder, in full remission, most recent episode depressed: Secondary | ICD-10-CM

## 2021-01-20 DIAGNOSIS — H04123 Dry eye syndrome of bilateral lacrimal glands: Secondary | ICD-10-CM | POA: Diagnosis not present

## 2021-01-20 DIAGNOSIS — Z0100 Encounter for examination of eyes and vision without abnormal findings: Secondary | ICD-10-CM | POA: Diagnosis not present

## 2021-01-20 DIAGNOSIS — H26493 Other secondary cataract, bilateral: Secondary | ICD-10-CM | POA: Diagnosis not present

## 2021-01-20 DIAGNOSIS — H16223 Keratoconjunctivitis sicca, not specified as Sjogren's, bilateral: Secondary | ICD-10-CM | POA: Diagnosis not present

## 2021-01-20 DIAGNOSIS — H43393 Other vitreous opacities, bilateral: Secondary | ICD-10-CM | POA: Diagnosis not present

## 2021-01-20 DIAGNOSIS — H43813 Vitreous degeneration, bilateral: Secondary | ICD-10-CM | POA: Diagnosis not present

## 2021-01-23 ENCOUNTER — Encounter: Payer: Self-pay | Admitting: Internal Medicine

## 2021-01-24 ENCOUNTER — Other Ambulatory Visit: Payer: Self-pay

## 2021-01-24 ENCOUNTER — Encounter: Payer: Self-pay | Admitting: Psychiatry

## 2021-01-24 ENCOUNTER — Telehealth (INDEPENDENT_AMBULATORY_CARE_PROVIDER_SITE_OTHER): Payer: Medicare HMO | Admitting: Psychiatry

## 2021-01-24 DIAGNOSIS — F410 Panic disorder [episodic paroxysmal anxiety] without agoraphobia: Secondary | ICD-10-CM | POA: Diagnosis not present

## 2021-01-24 DIAGNOSIS — G4701 Insomnia due to medical condition: Secondary | ICD-10-CM | POA: Diagnosis not present

## 2021-01-24 DIAGNOSIS — Z79899 Other long term (current) drug therapy: Secondary | ICD-10-CM | POA: Diagnosis not present

## 2021-01-24 DIAGNOSIS — F4310 Post-traumatic stress disorder, unspecified: Secondary | ICD-10-CM | POA: Diagnosis not present

## 2021-01-24 DIAGNOSIS — R635 Abnormal weight gain: Secondary | ICD-10-CM | POA: Diagnosis not present

## 2021-01-24 DIAGNOSIS — F3162 Bipolar disorder, current episode mixed, moderate: Secondary | ICD-10-CM | POA: Diagnosis not present

## 2021-01-24 MED ORDER — RISPERIDONE 0.5 MG PO TABS: 0.5000 mg | ORAL_TABLET | Freq: Every day | ORAL | 1 refills | Status: DC

## 2021-01-24 NOTE — Progress Notes (Signed)
Virtual Visit via Video Note  I connected with Ashley Fields on 01/24/21 at  8:30 AM EST by a video enabled telemedicine application and verified that I am speaking with the correct person using two identifiers.  Location Provider Location : [facility] Patient Location : Home  Participants: Patient , Provider   I discussed the limitations of evaluation and management by telemedicine and the availability of in person appointments. The patient expressed understanding and agreed to proceed.    I discussed the assessment and treatment plan with the patient. The patient was provided an opportunity to ask questions and all were answered. The patient agreed with the plan and demonstrated an understanding of the instructions.   The patient was advised to call back or seek an in-person evaluation if the symptoms worsen or if the condition fails to improve as anticipated.    [place] MD OP Progress Note  01/24/2021 8:54 AM Ashley Fields  MRN:  [id number]  Chief Complaint:  Chief Complaint   Follow-up; Anxiety; Depression    HPI: Ashley Fields is a 66 year old Caucasian female, married, on disability, lives in [place], has a history of bipolar disorder, PTSD, chronic pain, migraine headaches, history of laparoscopic gastric bypass was evaluated by telemedicine today.  Patient today reports she continues to struggle with mood lability, irritability, although improving on the lower dosage of risperidone.  She denies any side effects to the risperidone and is compliant.  Patient reports sleep as restless as well.  She reports she continues to struggle with lower back pain, currently rates her pain at a 7 out of 10, 10 being the worst.  She has upcoming appointment on Thursday with orthopedic provider for a second opinion.  She looks forward to that.  She reports she has difficulty sitting down for a long time as well as walking around due to the pain.  The pain does have an impact on her mood as well as  sleep.  Patient denies any suicidality, homicidality or perceptual disturbances.  She has been noncompliant with labs ordered, agrees to get it done soon.  Patient denies any other concerns today.  Visit Diagnosis:    ICD-10-CM   1. Bipolar 1 disorder, mixed, moderate (HCC)  F31.62 risperiDONE (RISPERDAL) 0.5 MG tablet    2. PTSD (post-traumatic stress disorder)  F43.10 risperiDONE (RISPERDAL) 0.5 MG tablet    3. Panic disorder  F41.0 risperiDONE (RISPERDAL) 0.5 MG tablet    4. Insomnia due to medical condition  G47.01    mood, pain    5. Weight gain  R63.5    secondary to psychotropics    6. High risk medication use  Z79.899       Past Psychiatric History: Reviewed past psychiatric history from progress note on 05/15/2018.  Past trials of Prozac, Wellbutrin, Lexapro, Cymbalta, Rexulti, Abilify, Ambien-nightmares, Lunesta  Past Medical History:  Past Medical History:  Diagnosis Date   Allergy    Anemia    Anxiety    Arthritis    Yes   Blood transfusion without reported diagnosis 1977   Transfusions from miscarriage & hemorrhaging   Chronic kidney disease    Depression    Disp fx of cuboid bone of right foot, init for clos fx 01/01/2017   GERD (gastroesophageal reflux disease)    Headache    Hepatitis C    Hyperlipidemia    Neuromuscular disorder ([place]) 1997   Falling to much was an eye-opener   Osteoporosis    Thyroid disease  Urine incontinence     Past Surgical History:  Procedure Laterality Date   5 miscarriages     1977-1985, Blood transfusion s/p miscarriage [place]   Total   APPENDECTOMY  12/2008   AUGMENTATION MAMMAPLASTY Bilateral 2015   Bilat   AUGMENTATION MAMMAPLASTY Bilateral 2018   implants redone w/ placement of implants under muscle   breast lift bilateral, implants  02/72/5366   bilateral, silicon naturel   BREAST REDUCTION SURGERY Bilateral 1997   CESAREAN SECTION  07/27/1981   Placenta Previa    CHOLECYSTECTOMY  03/2004   Lap surgery   COLONOSCOPY WITH PROPOFOL N/A 06/13/2020   Procedure: COLONOSCOPY WITH PROPOFOL;  Surgeon: Virgel Manifold, MD;  Location: [facility] ENDOSCOPY;  Service: Endoscopy;  Laterality: N/A;   COSMETIC SURGERY     Breast implants w/ lift, tummy tuck, upper & lower Blepharop   ESOPHAGOGASTRODUODENOSCOPY (EGD) WITH PROPOFOL N/A 06/13/2020   Procedure: ESOPHAGOGASTRODUODENOSCOPY (EGD) WITH PROPOFOL;  Surgeon: Virgel Manifold, MD;  Location: [facility] ENDOSCOPY;  Service: Endoscopy;  Laterality: N/A;   EYE SURGERY  2017   Cataract surgery & lasik   GASTRIC BYPASS  2005   Laparoscopic   IVC FILTER INSERTION  12/2003   TrapEase Vena Cava Filter   LUMBAR LAMINECTOMY  06/2006   L4-L5 (spinal fusion)   mini tummy tuck  05/07/2016   Bilateral bra/back roll lift skin removal   neck surgery C3-C7  02/10/2015   ACDF   REDUCTION MAMMAPLASTY Bilateral 1997   SHOULDER ACROMIOPLASTY Left 03/27/2013   w/ labral debridement   SMALL INTESTINE SURGERY  12/24/2003   Lap Gastric Bypass   SPINAL CORD STIMULATOR IMPLANT  11/2010   removed 05/2011   SPINE SURGERY  2008 2017   Fusion L4-L5, ACDF C4-C7    Family Psychiatric History: Reviewed family psychiatric history from progress note on 05/15/2018.  Family History:  Family History  Problem Relation Age of Onset   COPD Mother    Lung cancer Mother    Diabetes Mother    Heart disease Mother    Stroke Mother    Heart attack Mother    Arthritis Mother    Cancer Mother    Hypertension Mother    Obesity Mother    Varicose Veins Mother    Heart disease Father    Heart attack Father 64   Early death Father    Depression Sister        x 33 sisters   COPD Sister    Hypertension Sister    Heart disease Brother        x 3 brothers   Diabetes Brother    Colon polyps Sister    Hearing loss Sister    Heart attack Sister    Heart attack Brother    Arthritis Sister    Varicose Veins Sister    Arthritis Brother    Heart  disease Brother    Hypertension Brother    Cancer Maternal Grandfather    Stroke Maternal Grandfather    COPD Sister    Obesity Sister    Depression Sister    Depression Sister    Heart disease Sister    Hypertension Sister    Diabetes Brother    Heart disease Brother    Hypertension Brother    Obesity Brother    Varicose Veins Brother    Heart disease Brother    Hypertension Brother    Obesity Brother    Hypertension Sister  Obesity Sister    Miscarriages / Stillbirths Maternal Aunt    Miscarriages / Stillbirths Paternal Aunt    Breast cancer Neg Hx     Social History: Reviewed social history from progress note on 05/15/2018. Social History   Socioeconomic History   Marital status: Married    Spouse name: Not on file   Number of children: 1   Years of education: High School   Highest education level: Some college, no degree  Occupational History   Occupation: disability  Tobacco Use   Smoking status: Former    Packs/day: 0.25    Years: 10.00    Pack years: 2.50    Types: Cigarettes    Quit date: 11/29/1995    Years since quitting: 25.1   Smokeless tobacco: Former   Tobacco comments:    Quite 1997, didnt smoke hardly at all when I was smoking.  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]   Social Determinants of Radio broadcast assistant Strain: Low Risk    Difficulty of Paying Living Expenses: Not hard at all  Food Insecurity: No Food Insecurity   Worried About Charity fundraiser in the Last Year: Never true   Arboriculturist in the Last Year: Never true  Transportation Needs: No Transportation Needs    Lack of Transportation (Medical): No   Lack of Transportation (Non-Medical): No  Physical Activity: Inactive   Days of Exercise per Week: 0 days   Minutes of Exercise per Session: 0 min  Stress: Stress Concern Present   Feeling of Stress : Very much  Social Connections: Not on file    Allergies:  Allergies  Allergen Reactions   Flagyl [Metronidazole] Anaphylaxis   Cymbalta [Duloxetine Hcl]    Tape Rash    Metabolic Disorder Labs: Lab Results  Component Value Date   HGBA1C 5.0 03/30/2020   MPG 97 03/30/2020   MPG 97 03/17/2019   No results found for: PROLACTIN Lab Results  Component Value Date   CHOL 152 03/30/2020   TRIG 67 03/30/2020   HDL 89 03/30/2020   CHOLHDL 1.7 03/30/2020   LDLCALC 49 03/30/2020   LDLCALC 42 09/22/2019   Lab Results  Component Value Date   TSH 1.97 03/30/2020   TSH 0.86 08/11/2018    Therapeutic Level Labs: No results found for: LITHIUM No results found for: VALPROATE No components found for:  CBMZ  Current Medications: Current Outpatient Medications  Medication Sig Dispense Refill   risperiDONE (RISPERDAL) 0.5 MG tablet Take 1 tablet (0.5 mg total) by mouth at bedtime. 30 tablet 1   ASPIRIN LOW DOSE 81 MG EC tablet TAKE 1 TABLET BY MOUTH EVERY DAY 90 tablet 3   butalbital-acetaminophen-caffeine (FIORICET) 50-325-40 MG tablet Take 1 tablet by mouth 2 (two) times daily as needed for headache.     Cholecalciferol 250 MCG (10000 UT) CAPS Take by mouth.     cyanocobalamin (,VITAMIN B-12,) 1000 MCG/ML injection Inject 1,000 mcg into the muscle every 30 (thirty) days.     diazepam (VALIUM) 5 MG tablet Valium 5 mg tablet  1-2 tabs pre-procedure  bring this medication with you on the day of the injection     diclofenac Sodium (VOLTAREN) 1 % GEL Apply 2 g topically 4 (four) times daily.     estradiol (ESTRACE) 1 MG tablet TAKE 1 TABLET EVERY DAY 90 tablet 1   etodolac (LODINE) 500 MG tablet TAKE 1 TABLET BY MOUTH TWICE A DAY 180 tablet 1    ezetimibe (ZETIA) 10 MG tablet TAKE ONE TABLET BY MOUTH ONE TIME DAILY 90 tablet 1   fluticasone (FLONASE) 50 MCG/ACT nasal spray SPRAY 2 SPRAYS INTO EACH NOSTRIL EVERY DAY 48 mL 3   furosemide (LASIX) 20 MG tablet TAKE 1 TABLET EVERY DAY FOR FLUID OR EDEMA AS NEEDED 90 tablet 0   gabapentin (NEURONTIN) 600 MG tablet Take 1 tablet (600 mg total) by mouth 3 (three) times daily. 90 tablet 3   hydrOXYzine (ATARAX/VISTARIL) 25 MG tablet TAKE 1/2 TO 1 TABLET BY MOUTH TWICE A DAY AS NEEDED FOR SEVERE PANIC ATTACKS/ ANXIETY 180 tablet 1   lamoTRIgine (LAMICTAL) 150 MG tablet TAKE 1/2 TABLET TWICE DAILY 90 tablet 0   mometasone (ELOCON) 0.1 % cream Apply 1 application topically 2 (two) times daily.     omeprazole (PRILOSEC) 40 MG capsule Take 40 mg by mouth daily.     ondansetron (ZOFRAN ODT) 4 MG disintegrating tablet Take 1 tablet (4 mg total) by mouth every 8 (eight) hours as needed for nausea or vomiting. 30 tablet 2   OZEMPIC, 1 MG/DOSE, 4 MG/3ML SOPN INJECT 1MG  INTO THE SKIN ONCE A WEEK 3 mL 2   predniSONE (DELTASONE) 20 MG tablet Take 2 tablets daily (40mg ) for 4 days, take 1 tab daily (20mg ) for 4 days, take half tab daily (10mg ) for 4 days 14 tablet 0   propranolol (INDERAL) 10 MG tablet TAKE 1 TABLET THREE TIMES DAILY AS NEEDED FOR SEVERE ANXIETY ATTACKS (LIMITED SUPPLY) 90 tablet 0   rosuvastatin (CRESTOR) 40 MG tablet TAKE 1 TABLET EVERY DAY 90 tablet 1   tiZANidine (ZANAFLEX) 4 MG tablet Take 4 mg by mouth 4 (four) times daily.     traMADol (ULTRAM) 50 MG tablet Take 50 mg by mouth 4 (four) times daily.     traZODone (DESYREL) 100 MG tablet TAKE 1 TABLET EVERY DAY AT BEDTIME 90 tablet 0   venlafaxine XR (EFFEXOR-XR) 75 MG 24 hr capsule TAKE ONE CAPSULE BY MOUTH DAILY WITH BREAKFAST 90 capsule 1   XIIDRA 5 % SOLN INSTILL 1 DROP INTO BOTH EYES TWICE A DAY     No current facility-administered medications for this visit.     Musculoskeletal: Strength & Muscle Tone:  UTA Gait & Station:   Seated Patient leans: N/A  Psychiatric Specialty Exam: Review of Systems  Musculoskeletal:  Positive for back pain.  Psychiatric/Behavioral:  Positive for sleep disturbance. The patient is nervous/anxious.        Irritable  All other systems reviewed and are negative.  There were no vitals taken for this visit.There is no height or weight on file to calculate BMI.  General Appearance: Casual  Eye Contact:  Fair  Speech:  Clear and Coherent  Volume:  Normal  Mood:  Anxious and Irritable  Affect:  Congruent  Thought Process:  Goal Directed and Descriptions of Associations: Intact  Orientation:  Full (Time, Place, and Person)  Thought Content: Logical   Suicidal Thoughts:  No  Homicidal Thoughts:  No  Memory:  Immediate;   Fair Recent;   Fair Remote;   Fair  Judgement:  Fair  Insight:  Fair  Psychomotor Activity:  Normal  Concentration:  Concentration: Fair and Attention Span: Fair  Recall:  [employer] of Knowledge: Fair  Language: Fair  Akathisia:  No  Handed:  Right  AIMS (if indicated): not done  Assets:  Communication Skills Desire for [place] Talents/Skills Transportation  ADL's:  Intact  Cognition: WNL  Sleep:  Poor due to pain   Screenings: GAD-7    Flowsheet Row Office Visit from 12/28/2019 in [facility] Office Visit from 03/17/2019 in [facility] Office Visit from 11/20/2018 in [facility] Office Visit from 08[facility]18/2018 in [facility] Office Visit from 07/22/2018 in [facility]  Total GAD-7 Score 2 2 4 9 8       PHQ2-9    Flowsheet Row Video Visit from 08/02/2020 in [facility] Video Visit from 07/18/2020 in [facility] Video Visit from 04/25/2020 in [facility] from 03/29/2020 in [facility] Office Visit from 12/28/2019 in [facility]  PHQ-2 Total Score 1 6 2 4  0  PHQ-9 Total Score 10 21 5 9 1       Flowsheet Row Video Visit from 08/02/2020 in [facility] Admission (Discharged) from 06/13/2020 in [place] No Risk No Risk        Assessment and Plan: Ashley Fields is a 66 year old Caucasian female who has a history of PTSD, bipolar disorder, panic disorder, migraine headaches, chronic pain was evaluated by telemedicine today.  Patient is currently struggling with significant back pain which does have an impact on her mood, continues to struggle with irritability, sleep problems exacerbated by back pain.  She will benefit from sufficient management of her pain.  We will continue to make medication changes.  Plan as noted below.  Plan  Bipolar disorder mixed moderate-some improvement Increase risperidone to 0.5 mg p.o. nightly Lamotrigine 150 mg p.o. daily divided dosage Gabapentin 600 mg p.o. 3 times daily-prescribed by primary care provider.  Patient advised to take it at the end of the day if she feels sluggish during the day.  PTSD-stable Venlafaxine 75 mg p.o. daily Hydroxyzine 25 mg p.o. daily as needed for severe anxiety attacks  Panic disorder-stable Venlafaxine 75 mg p.o. daily  Insomnia-unstable Sleep problems secondary to pain.  She will benefit from sufficient pain management Trazodone 50-100 mg p.o. nightly  Weight gain likely secondary to medications-unstable Continue lifestyle modification.  High risk medication use-encouraged compliance-pending prolactin level.  Follow-up in clinic in 4 weeks or sooner if needed.  This note was generated in part or whole with voice recognition software. Voice recognition is usually quite accurate but there are transcription errors that can and very often do occur. I apologize for any typographical errors that were not detected and corrected.     Ursula Alert, MD 01/24/2021, 8:54 AM

## 2021-01-25 ENCOUNTER — Encounter: Payer: Self-pay | Admitting: Internal Medicine

## 2021-01-26 DIAGNOSIS — M4727 Other spondylosis with radiculopathy, lumbosacral region: Secondary | ICD-10-CM | POA: Diagnosis not present

## 2021-01-27 ENCOUNTER — Other Ambulatory Visit: Payer: Self-pay | Admitting: Cardiovascular Disease

## 2021-01-27 DIAGNOSIS — R609 Edema, unspecified: Secondary | ICD-10-CM

## 2021-01-31 ENCOUNTER — Other Ambulatory Visit: Payer: Self-pay | Admitting: Family Medicine

## 2021-01-31 DIAGNOSIS — J3089 Other allergic rhinitis: Secondary | ICD-10-CM

## 2021-02-01 ENCOUNTER — Ambulatory Visit: Payer: Medicare HMO | Admitting: Cardiovascular Disease

## 2021-02-01 ENCOUNTER — Other Ambulatory Visit: Payer: Self-pay

## 2021-02-01 ENCOUNTER — Encounter: Payer: Self-pay | Admitting: Cardiovascular Disease

## 2021-02-01 VITALS — BP 140/92 | HR 84 | Ht 62.0 in | Wt 207.5 lb

## 2021-02-01 DIAGNOSIS — I25118 Atherosclerotic heart disease of native coronary artery with other forms of angina pectoris: Secondary | ICD-10-CM

## 2021-02-01 DIAGNOSIS — I739 Peripheral vascular disease, unspecified: Secondary | ICD-10-CM | POA: Diagnosis not present

## 2021-02-01 DIAGNOSIS — E782 Mixed hyperlipidemia: Secondary | ICD-10-CM

## 2021-02-01 NOTE — Telephone Encounter (Signed)
Requested Prescriptions  Pending Prescriptions Disp Refills   fluticasone (FLONASE) 50 MCG/ACT nasal spray [Pharmacy Med Name: FLUTICASONE PROP 50 MCG SPRAY] 48 mL 2    Sig: SPRAY 2 SPRAYS INTO EACH NOSTRIL EVERY DAY     Ear, Nose, and Throat: Nasal Preparations - Corticosteroids Passed - 01/31/2021  6:01 PM      Passed - Valid encounter within last 12 months    Recent Outpatient Visits          1 month ago Acute right-sided low back pain with right-sided sciatica   [place], DO   3 months ago Acute non-recurrent frontal sinusitis   [facility] Olin Hauser, DO   9 months ago Annual physical exam   [facility] Olin Hauser, DO   1 year ago Acute viral pharyngitis   [facility] Olin Hauser, DO   1 year ago Morbid obesity [facility])   [facility], Devonne Doughty, DO      Future Appointments            Today Rockey Situ, Kathlene November, MD [facility], [place]   In 2 months  [facility], [place]   In 2 months [place], Devonne Doughty, [facility], [facility]

## 2021-02-01 NOTE — Progress Notes (Addendum)
Date:  02/01/2021   ID:  Ashley Fields, DOB April 21, 1955, MRN [id number]  Patient Location:  [facility] [place] [facility] [place] [id number]   Provider location:   [facility], [place] office  PCP:  Olin Hauser, DO  Cardiologist:  Arvid Right [facility]   Chief Complaint  Patient presents with   6 month follow up     Patient c/o shortness of breath with tingling in the back of head when bending over to tie her shoes. Medications reviewed by the patient verbally.      History of Present Illness:    Ashley Fields is a 66 y.o. 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,  Vascular disease,  Coronary calcification , calcium score 2400 Who presents for follow-up of her coronary artery disease  Last seen in clinic August 01, 2020 On that visit was not taking Lasix, weight up 6 pounds Had atypical left-sided discomfort extending into left shoulder, left neck with tingling  [place] her [place]  In follow-up today reports having worsening Back pain, sciatica,  Nerve block pending, [facility] in the next week or so Was told to stop her aspirin 1 week prior to injection Back pain symptoms worse with driving back surgery  Oct 21st 2019  when leaning over to tie shoes, SOB SOB with walking No regular exercise program, she realizes weight has been trending upwards  Nuclear stress test August 2022,  results reviewed in detail There was no ST segment deviation noted during stress. No T wave inversion was noted during stress. Defect 1: There is a small defect of mild severity present in the apex location likely artifact. The study is normal. This is a low risk study. The left ventricular ejection fraction is normal (55-65%). CT attenuation images shows moderate aortic and coronary calcifications.   Lab  work reviewed A1C 5.0 CMP normal  EKG personally reviewed by myself on todays visit NSR rate 84 no ST and T wave changes  Other past medical history reviewed Stress test 06/13/2017 Pharmacological myocardial perfusion imaging study with no significant  ischemia Normal wall motion, EF estimated at 83% Low risk scan  Remote smoking history for at least 15 years  CT chest  calcium score of 2400  aortic atherosclerosis noted "Coronary arteries: Marked LM and 3 vessel coronary calcification" Ascending Aorta: Calcific aortic atherosclerosis normal diameter 3.4 cm  minimal carotid disease bilaterally  less than 39% bilaterally  Family history loss of her sister recently, due to cardiac arrest father died 45 MI Sister 72 died cardiac arrest   Past Medical History:  Diagnosis Date   Allergy    Anemia    Anxiety    Arthritis    Yes   Blood transfusion without reported diagnosis 1977   Transfusions from miscarriage & hemorrhaging   Chronic kidney disease    Depression    Disp fx of cuboid bone of right foot, init for clos fx 01/01/2017   GERD (gastroesophageal reflux disease)    Headache    Hepatitis C    Hyperlipidemia    Neuromuscular disorder ([place]) 1997   Falling to much was an eye-opener   Osteoporosis    Thyroid disease    Urine incontinence    Past Surgical History:  Procedure Laterality Date   5 miscarriages  [phone], Blood transfusion s/p miscarriage [place]   Total   APPENDECTOMY  12/2008   AUGMENTATION MAMMAPLASTY Bilateral 2015   Bilat   AUGMENTATION MAMMAPLASTY Bilateral 2018   implants redone w/ placement of implants under muscle   breast lift bilateral, implants  56/31/4970   bilateral, silicon naturel   BREAST REDUCTION SURGERY Bilateral 1997   CESAREAN SECTION  07/27/1981   Placenta Previa   CHOLECYSTECTOMY  03/2004   Lap surgery   COLONOSCOPY WITH PROPOFOL N/A 06/13/2020   Procedure: COLONOSCOPY WITH PROPOFOL;   Surgeon: Virgel Manifold, MD;  Location: [facility];  Service: Endoscopy;  Laterality: N/A;   COSMETIC SURGERY     Breast implants w/ lift, tummy tuck, upper & lower Blepharop   ESOPHAGOGASTRODUODENOSCOPY (EGD) WITH PROPOFOL N/A 06/13/2020   Procedure: ESOPHAGOGASTRODUODENOSCOPY (EGD) WITH PROPOFOL;  Surgeon: Virgel Manifold, MD;  Location: [facility] END[facility]COPY;  Service: Endoscopy;  Laterality: N/A;   EYE SURGERY  2017   Cataract surgery & lasik   GASTRIC BYPASS  2005   Laparoscopic   IVC FILTER INSERTION  12/2003   TrapEase Vena Cava Filter   LUMBAR LAMINECTOMY  06/2006   L4-L5 (spinal fusion)   mini tummy tuck  05/07/2016   Bilateral bra/back roll lift skin removal   neck surgery C3-C7  02/10/2015   ACDF   REDUCTION MAMMAPLASTY Bilateral 1997   SHOULDER ACROMIOPLASTY Left 03/27/2013   w/ labral debridement   SMALL INTESTINE SURGERY  12/24/2003   Lap Gastric Bypass   SPINAL CORD STIMULATOR IMPLANT  11/2010   removed 05/2011   SPINE SURGERY  2008 2017   Fusion L4-L5, ACDF C4-C7      Allergies:   Flagyl [metronidazole], Cymbalta [duloxetine hcl], and Tape   Social History   Tobacco Use   Smoking status: Former    Packs/day: 0.25    Years: 10.00    Pack years: 2.50    Types: Cigarettes    Quit date: 11/29/1995    Years since quitting: 25.1   Smokeless tobacco: Former   Tobacco comments:    Quite 1997, didnt smoke hardly at all when I was smoking.  Vaping Use   Vaping Use: Never used  Substance Use Topics   Alcohol use: No   Drug use: No     Current Outpatient Medications on File Prior to Visit  Medication Sig Dispense Refill   ASPIRIN LOW DOSE 81 MG EC tablet TAKE 1 TABLET BY MOUTH EVERY DAY 90 tablet 3   butalbital-acetaminophen-caffeine (FIORICET) 50-325-40 MG tablet Take 1 tablet by mouth 2 (two) times daily as needed for headache.     Cholecalciferol 250 MCG (10000 UT) CAPS Take by mouth.     cyanocobalamin (,VITAMIN B-12,) 1000 MCG/ML injection Inject  1,000 mcg into the muscle every 30 (thirty) days.     diclofenac Sodium (VOLTAREN) 1 % GEL Apply 2 g topically 4 (four) times daily.     estradiol (ESTRACE) 1 MG tablet TAKE 1 TABLET EVERY DAY 90 tablet 1   etodolac (LODINE) 500 MG tablet TAKE 1 TABLET BY MOUTH TWICE A DAY 180 tablet 1   ezetimibe (ZETIA) 10 MG tablet TAKE ONE TABLET BY MOUTH ONE TIME DAILY 90 tablet 1   fluticasone (FLONASE) 50 MCG/ACT nasal spray SPRAY 2 SPRAYS INTO EACH NOSTRIL EVERY DAY 48 mL 2   furosemide (LASIX) 20 MG tablet TAKE 1 TABLET EVERY DAY FOR FLUID OR EDEMA AS NEEDED 90 tablet 0   gabapentin (NEURONTIN)  600 MG tablet Take 1 tablet (600 mg total) by mouth 3 (three) times daily. 90 tablet 3   hydrOXYzine (ATARAX/VISTARIL) 25 MG tablet TAKE 1/2 TO 1 TABLET BY MOUTH TWICE A DAY AS NEEDED FOR SEVERE PANIC ATTACKS/ ANXIETY 180 tablet 1   lamoTRIgine (LAMICTAL) 150 MG tablet TAKE 1/2 TABLET TWICE DAILY 90 tablet 0   mometasone (ELOCON) 0.1 % cream Apply 1 application topically 2 (two) times daily.     omeprazole (PRILOSEC) 40 MG capsule Take 40 mg by mouth daily.     ondansetron (ZOFRAN ODT) 4 MG disintegrating tablet Take 1 tablet (4 mg total) by mouth every 8 (eight) hours as needed for nausea or vomiting. 30 tablet 2   OZEMPIC, 1 MG/DOSE, 4 MG/3ML SOPN INJECT 1MG  INTO THE SKIN ONCE A WEEK 3 mL 2   propranolol (INDERAL) 10 MG tablet TAKE 1 TABLET THREE TIMES DAILY AS NEEDED FOR SEVERE ANXIETY ATTACKS (LIMITED SUPPLY) 90 tablet 0   risperiDONE (RISPERDAL) 0.5 MG tablet Take 1 tablet (0.5 mg total) by mouth at bedtime. 30 tablet 1   rosuvastatin (CRESTOR) 40 MG tablet TAKE 1 TABLET EVERY DAY 90 tablet 1   tiZANidine (ZANAFLEX) 4 MG tablet Take 4 mg by mouth 4 (four) times daily.     traMADol (ULTRAM) 50 MG tablet Take 50 mg by mouth 4 (four) times daily.     traZODone (DESYREL) 100 MG tablet TAKE 1 TABLET EVERY DAY AT BEDTIME 90 tablet 0   venlafaxine XR (EFFEXOR-XR) 75 MG 24 hr capsule TAKE ONE CAPSULE BY MOUTH DAILY  WITH BREAKFAST 90 capsule 1   diazepam (VALIUM) 5 MG tablet Valium 5 mg tablet  1-2 tabs pre-procedure  bring this medication with you on the day of the injection (Patient not taking: Reported on 02/01/2021)     predniSONE (DELTASONE) 20 MG tablet Take 2 tablets daily (40mg ) for 4 days, take 1 tab daily (20mg ) for 4 days, take half tab daily (10mg ) for 4 days (Patient not taking: Reported on 02/01/2021) 14 tablet 0   XIIDRA 5 % SOLN INSTILL 1 DROP INTO BOTH EYES TWICE A DAY (Patient not taking: Reported on 02/01/2021)     No current facility-administered medications on file prior to visit.     Family Hx: The patient's family history includes Arthritis in her brother, mother, and sister; COPD in her mother, sister, and sister; Cancer in her maternal grandfather and mother; Colon polyps in her sister; Depression in her sister, sister, and sister; Diabetes in her brother, brother, and mother; Early death in her father; Hearing loss in her sister; Heart attack in her brother, mother, and sister; Heart attack (age of onset: 70) in her father; Heart disease in her brother, brother, brother, brother, father, mother, and sister; Hypertension in her brother, brother, brother, mother, sister, sister, and sister; Lung cancer in her mother; Miscarriages / Stillbirths in her maternal aunt and paternal aunt; Obesity in her brother, brother, mother, sister, and sister; Stroke in her maternal grandfather and mother; Varicose Veins in her brother, mother, and sister. There is no history of Breast cancer.  ROS:   Please see the history of present illness.    Review of Systems  Constitutional: Negative.   HENT: Negative.    Respiratory: Negative.    Cardiovascular: Negative.   Gastrointestinal: Negative.   Musculoskeletal: Negative.   Neurological: Negative.   Psychiatric/Behavioral: Negative.    All other systems reviewed and are negative.   Labs/Other Tests and Data Reviewed:  Recent Labs: 03/30/2020: ALT  23; TSH 1.97 09/02/2020: BUN 14; Creatinine, Ser 0.78; Hemoglobin 13.1; Platelets 152; Potassium 4.2; Sodium 139   Recent Lipid Panel Lab Results  Component Value Date/Time   CHOL 152 03/30/2020 08:02 AM   TRIG 67 03/30/2020 08:02 AM   HDL 89 03/30/2020 08:02 AM   CHOLHDL 1.7 03/30/2020 08:02 AM   LDLCALC 49 03/30/2020 08:02 AM    Wt Readings from Last 3 Encounters:  02/01/21 207 lb 8 oz (94.1 kg)  12/12/20 206 lb 3.2 oz (93.5 kg)  10/25/20 197 lb 3.2 oz (89.4 kg)     Exam:    Vital Signs: Vital signs may also be detailed in the HPI BP (!) 140/92 (BP Location: Left Arm, Patient Position: Sitting, Cuff Size: Large)    Pulse 84    Ht 5\' 2"  (1.575 m)    Wt 207 lb 8 oz (94.1 kg)    BMI 37.95 kg/m  Constitutional:  oriented to person, place, and time. No distress.  HENT:  Head: Grossly normal Eyes:  no discharge. No scleral icterus.  Neck: No JVD, no carotid bruits  Cardiovascular: Regular rate and rhythm, no murmurs appreciated Pulmonary/Chest: Clear to auscultation bilaterally, no wheezes or rails Abdominal: Soft.  no distension.  no tenderness.  Musculoskeletal: Normal range of motion Neurological:  normal muscle tone. Coordination normal. No atrophy Skin: Skin warm and dry Psychiatric: normal affect, pleasant  ASSESSMENT & PLAN:    Problem List Items Addressed This Visit       Cardiology Problems   Mixed hyperlipidemia   Atherosclerosis of native coronary artery of native heart with stable angina pectoris ([employer])   Relevant Orders   EKG 12-Lead   PAD (peripheral artery disease) ([place]) - Primary   Relevant Orders   EKG 12-Lead  Hollenhorst plaque, right eye -  Carotid with minimal bilateral disease significant aortic atherosclerosis  Cholesterol at goal   CAD with chronic stable angina Calcium score 2400 on  CT scan Negative stress test 2019 and August 2022 We have recommended that she continue Crestor 40 with Zetia 10 No recent chest pain symptoms concerning for  angina Recommended walking program, lifestyle modification  Mixed hyperlipidemia Cholesterol at goal, no changes made  Smoker  Reports that she quit over 20 years ago, 1997 she quit   Total encounter time more than 25 minutes  Greater than 50% was spent in counseling and coordination of care with the patient   Signed, Ida Rogue, [place] [facility] [address], [place], [place] [address]

## 2021-02-01 NOTE — Patient Instructions (Signed)
Medication Instructions:  No changes  If you need a refill on your cardiac medications before your next appointment, please call your pharmacy.   Lab work: No new labs needed  Testing/Procedures: No new testing needed  Follow-Up: At [facility], you and your health needs are our priority.  As part of our continuing mission to provide you with exceptional heart care, we have created designated Provider Care Teams.  These Care Teams include your primary Cardiologist (physician) and Advanced Practice Providers (APPs -  Physician Assistants and Nurse Practitioners) who all work together to provide you with the care you need, when you need it.  You will need a follow up appointment in 12 months  Providers on your designated Care Team:   Christopher Berge, NP Ryan Dunn, PA-C Cadence Furth, PA-C  COVID-19 Vaccine Information can be found at: [email or web address] For questions related to vaccine distribution or appointments, please email [email or web address] or call [phone].   

## 2021-02-06 DIAGNOSIS — M79604 Pain in right leg: Secondary | ICD-10-CM | POA: Diagnosis not present

## 2021-02-08 DIAGNOSIS — M5416 Radiculopathy, lumbar region: Secondary | ICD-10-CM | POA: Diagnosis not present

## 2021-02-10 ENCOUNTER — Other Ambulatory Visit: Payer: Self-pay | Admitting: Family Medicine

## 2021-02-15 ENCOUNTER — Other Ambulatory Visit: Payer: Self-pay | Admitting: Psychiatry

## 2021-02-15 DIAGNOSIS — F4310 Post-traumatic stress disorder, unspecified: Secondary | ICD-10-CM

## 2021-02-15 DIAGNOSIS — M533 Sacrococcygeal disorders, not elsewhere classified: Secondary | ICD-10-CM | POA: Diagnosis not present

## 2021-02-15 DIAGNOSIS — M25552 Pain in left hip: Secondary | ICD-10-CM | POA: Diagnosis not present

## 2021-02-15 DIAGNOSIS — M479 Spondylosis, unspecified: Secondary | ICD-10-CM | POA: Diagnosis not present

## 2021-02-15 DIAGNOSIS — G894 Chronic pain syndrome: Secondary | ICD-10-CM | POA: Diagnosis not present

## 2021-02-15 DIAGNOSIS — Z79891 Long term (current) use of opiate analgesic: Secondary | ICD-10-CM | POA: Diagnosis not present

## 2021-02-15 DIAGNOSIS — F3162 Bipolar disorder, current episode mixed, moderate: Secondary | ICD-10-CM

## 2021-02-15 DIAGNOSIS — M48061 Spinal stenosis, lumbar region without neurogenic claudication: Secondary | ICD-10-CM | POA: Diagnosis not present

## 2021-02-15 DIAGNOSIS — M545 Low back pain, unspecified: Secondary | ICD-10-CM | POA: Diagnosis not present

## 2021-02-15 DIAGNOSIS — M542 Cervicalgia: Secondary | ICD-10-CM | POA: Diagnosis not present

## 2021-02-15 DIAGNOSIS — M47896 Other spondylosis, lumbar region: Secondary | ICD-10-CM | POA: Diagnosis not present

## 2021-02-15 DIAGNOSIS — Z79899 Other long term (current) drug therapy: Secondary | ICD-10-CM | POA: Diagnosis not present

## 2021-02-15 DIAGNOSIS — F410 Panic disorder [episodic paroxysmal anxiety] without agoraphobia: Secondary | ICD-10-CM

## 2021-02-20 ENCOUNTER — Telehealth (INDEPENDENT_AMBULATORY_CARE_PROVIDER_SITE_OTHER): Payer: Medicare HMO | Admitting: Psychiatry

## 2021-02-20 ENCOUNTER — Other Ambulatory Visit: Payer: Self-pay

## 2021-02-20 ENCOUNTER — Encounter: Payer: Self-pay | Admitting: Psychiatry

## 2021-02-20 ENCOUNTER — Encounter: Payer: Self-pay | Admitting: Internal Medicine

## 2021-02-20 DIAGNOSIS — G4701 Insomnia due to medical condition: Secondary | ICD-10-CM

## 2021-02-20 DIAGNOSIS — F4310 Post-traumatic stress disorder, unspecified: Secondary | ICD-10-CM

## 2021-02-20 DIAGNOSIS — F3178 Bipolar disorder, in full remission, most recent episode mixed: Secondary | ICD-10-CM | POA: Diagnosis not present

## 2021-02-20 DIAGNOSIS — R635 Abnormal weight gain: Secondary | ICD-10-CM

## 2021-02-20 DIAGNOSIS — F410 Panic disorder [episodic paroxysmal anxiety] without agoraphobia: Secondary | ICD-10-CM | POA: Diagnosis not present

## 2021-02-20 MED ORDER — LAMOTRIGINE 150 MG PO TABS: 75.0000 mg | ORAL_TABLET | Freq: Two times a day (BID) | ORAL | 1 refills | Status: DC

## 2021-02-20 NOTE — Progress Notes (Signed)
Virtual Visit via Video Note  I connected with Ashley Fields on 02/20/21 at 10:30 AM EST by a video enabled telemedicine application and verified that I am speaking with the correct person using two identifiers.  Location Provider Location : [facility] Patient Location : Home  Participants: Patient , Provider    I discussed the limitations of evaluation and management by telemedicine and the availability of in person appointments. The patient expressed understanding and agreed to proceed.    I discussed the assessment and treatment plan with the patient. The patient was provided an opportunity to ask questions and all were answered. The patient agreed with the plan and demonstrated an understanding of the instructions.   The patient was advised to call back or seek an in-person evaluation if the symptoms worsen or if the condition fails to improve as anticipated.   [place] MD OP Progress Note  02/20/2021 12:50 PM Teryl Gubler  MRN:  [id number]  Chief Complaint:  Chief Complaint   Follow-up 66 year old Caucasian female with history of bipolar disorder, PTSD, panic disorder, insomnia, weight gain secondary to psychotropics, was evaluated for medication management.    HPI: Ashley Fields is a 66 year old Caucasian female, married, on disability, lives in [place], has a history of bipolar disorder, PTSD, chronic pain, migraine headaches, history of laparoscopic gastric bypass was evaluated by telemedicine today.  Patient today reports she had an appointment with her pain provider and her medications were readjusted.  Her pain is currently under better control.  That does help her with her mood.  She reports her mood symptoms as improving.  Sleep is better.  She is sleeping through the night.  She is compliant on her current psychotropic medications.  Has been unable to get the prolactin level or at least to get it done soon.  Denies suicidality, homicidality or perceptual disturbances.  Denies any  side effects.  Denies any other concerns today.  Visit Diagnosis:    ICD-10-CM   1. Bipolar disorder, in full remission, most recent episode mixed ([place])  F31.78     2. PTSD (post-traumatic stress disorder)  F43.10 lamoTRIgine (LAMICTAL) 150 MG tablet    3. Panic disorder  F41.0     4. Insomnia due to medical condition  G47.01    Mood    5. Weight gain  R63.5    secondary to psychotropics      Past Psychiatric History: Reviewed past psychiatric history from progress note on 05/15/2018.  Past trials of Prozac, Wellbutrin, Lexapro, Cymbalta, Rexulti, Abilify, Ambien-nightmares, Lunesta.  Past Medical History:  Past Medical History:  Diagnosis Date   Allergy    Anemia    Anxiety    Arthritis    Yes   Blood transfusion without reported diagnosis 1977   Transfusions from miscarriage & hemorrhaging   Chronic kidney disease    Depression    Disp fx of cuboid bone of right foot, init for clos fx 01/01/2017   GERD (gastroesophageal reflux disease)    Headache    Hepatitis C    Hyperlipidemia    Neuromuscular disorder ([place]) 1997   Falling to much was an eye-opener   Osteoporosis    Thyroid disease    Urine incontinence     Past Surgical History:  Procedure Laterality Date   5 miscarriages     1977-1985, Blood transfusion s/p miscarriage [place]   Total   APPENDECTOMY  12/2008   AUGMENTATION MAMMAPLASTY Bilateral 2015   Bilat  AUGMENTATION MAMMAPLASTY Bilateral 2018   implants redone w/ placement of implants under muscle   breast lift bilateral, implants  40/34/7425   bilateral, silicon naturel   BREAST REDUCTION SURGERY Bilateral 1997   CESAREAN SECTION  07/27/1981   Placenta Previa   CHOLECYSTECTOMY  03/2004   Lap surgery   COLONOSCOPY WITH PROPOFOL N/A 06/13/2020   Procedure: COLONOSCOPY WITH PROPOFOL;  Surgeon: Virgel Manifold, MD;  Location: [facility] ENDOSCOPY;  Service: Endoscopy;  Laterality: N/A;   COSMETIC SURGERY     Breast  implants w/ lift, tummy tuck, upper & lower Blepharop   ESOPHAGOGASTRODUODENOSCOPY (EGD) WITH PROPOFOL N/A 06/13/2020   Procedure: ESOPHAGOGASTRODUODENOSCOPY (EGD) WITH PROPOFOL;  Surgeon: Virgel Manifold, MD;  Location: [facility] ENDOSCOPY;  Service: Endoscopy;  Laterality: N/A;   EYE SURGERY  2017   Cataract surgery & lasik   GASTRIC BYPASS  2005   Laparoscopic   IVC FILTER INSERTION  12/2003   TrapEase Vena Cava Filter   LUMBAR LAMINECTOMY  06/2006   L4-L5 (spinal fusion)   mini tummy tuck  05/07/2016   Bilateral bra/back roll lift skin removal   neck surgery C3-C7  02/10/2015   ACDF   REDUCTION MAMMAPLASTY Bilateral 1997   SHOULDER ACROMIOPLASTY Left 03/27/2013   w/ labral debridement   SMALL INTESTINE SURGERY  12/24/2003   Lap Gastric Bypass   SPINAL CORD STIMULATOR IMPLANT  11/2010   removed 05/2011   SPINE SURGERY  2008 2017   Fusion L4-L5, ACDF C4-C7    Family Psychiatric History: Reviewed family psychiatric history from progress note on 05/15/2018.  Family History:  Family History  Problem Relation Age of Onset   COPD Mother    Lung cancer Mother    Diabetes Mother    Heart disease Mother    Stroke Mother    Heart attack Mother    Arthritis Mother    Cancer Mother    Hypertension Mother    Obesity Mother    Varicose Veins Mother    Heart disease Father    Heart attack Father 72   Early death Father    Depression Sister        x 56 sisters   COPD Sister    Hypertension Sister    Heart disease Brother        x 3 brothers   Diabetes Brother    Colon polyps Sister    Hearing loss Sister    Heart attack Sister    Heart attack Brother    Arthritis Sister    Varicose Veins Sister    Arthritis Brother    Heart disease Brother    Hypertension Brother    Cancer Maternal Grandfather    Stroke Maternal Grandfather    COPD Sister    Obesity Sister    Depression Sister    Depression Sister    Heart disease Sister    Hypertension Sister    Diabetes Brother     Heart disease Brother    Hypertension Brother    Obesity Brother    Varicose Veins Brother    Heart disease Brother    Hypertension Brother    Obesity Brother    Hypertension Sister    Obesity Sister    Miscarriages / Stillbirths Maternal Aunt    Miscarriages / Stillbirths Paternal Aunt    Breast cancer Neg Hx     Social History: Reviewed social history from progress note on 05/15/2018. Social History   Socioeconomic History   Marital status: Married  Spouse name: Not on file   Number of children: 1   Years of education: High School   Highest education level: Some college, no degree  Occupational History   Occupation: disability  Tobacco Use   Smoking status: Former    Packs/day: 0.25    Years: 10.00    Pack years: 2.50    Types: Cigarettes    Quit date: 11/29/1995    Years since quitting: 25.2   Smokeless tobacco: Former   Tobacco comments:    Quite 1997, didnt smoke hardly at all when I was smoking.  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]   Social Determinants of Radio broadcast assistant Strain: Low Risk    Difficulty of Paying Living Expenses: Not hard at all  Food Insecurity: No Food Insecurity   Worried About Charity fundraiser in the Last Year: Never true   Arboriculturist in the Last Year: Never true  Transportation Needs: No Transportation Needs   Lack of Transportation (Medical): No   Lack of Transportation (Non-Medical): No  Physical Activity: Inactive   Days of Exercise per Week: 0 days   Minutes of Exercise per Session: 0 min  Stress: Stress Concern Present   Feeling of Stress : Very much   Social Connections: Not on file    Allergies:  Allergies  Allergen Reactions   Flagyl [Metronidazole] Anaphylaxis   Cymbalta [Duloxetine Hcl]    Tape Rash    Metabolic Disorder Labs: Lab Results  Component Value Date   HGBA1C 5.0 03/30/2020   MPG 97 03/30/2020   MPG 97 03/17/2019   No results found for: PROLACTIN Lab Results  Component Value Date   CHOL 152 03/30/2020   TRIG 67 03/30/2020   HDL 89 03/30/2020   CHOLHDL 1.7 03/30/2020   LDLCALC 49 03/30/2020   LDLCALC 42 09/22/2019   Lab Results  Component Value Date   TSH 1.97 03/30/2020   TSH 0.86 08/11/2018    Therapeutic Level Labs: No results found for: LITHIUM No results found for: VALPROATE No components found for:  CBMZ  Current Medications: Current Outpatient Medications  Medication Sig Dispense Refill   ASPIRIN LOW DOSE 81 MG EC tablet TAKE 1 TABLET BY MOUTH EVERY DAY 90 tablet 3   butalbital-acetaminophen-caffeine (FIORICET) 50-325-40 MG tablet Take 1 tablet by mouth 2 (two) times daily as needed for headache.     Cholecalciferol 250 MCG (10000 UT) CAPS Take by mouth.     cyanocobalamin (,VITAMIN B-12,) 1000 MCG/ML injection Inject 1,000 mcg into the muscle every 30 (thirty) days.     diazepam (VALIUM) 5 MG tablet Valium 5 mg tablet  1-2 tabs pre-procedure  bring this medication with you on the day of the injection (Patient not taking: Reported on 02/01/2021)     diclofenac Sodium (VOLTAREN) 1 % GEL Apply 2 g topically 4 (four) times daily.     estradiol (ESTRACE) 1 MG tablet TAKE 1 TABLET EVERY DAY 90 tablet 1   etodolac (LODINE) 500 MG tablet TAKE 1 TABLET BY MOUTH TWICE A DAY 180 tablet 1   ezetimibe (  ZETIA) 10 MG tablet TAKE ONE TABLET BY MOUTH ONE TIME DAILY 90 tablet 1   fluticasone (FLONASE) 50 MCG/ACT nasal spray SPRAY 2 SPRAYS INTO EACH NOSTRIL EVERY DAY 48 mL 2   furosemide (LASIX) 20 MG tablet TAKE 1 TABLET EVERY DAY FOR FLUID OR EDEMA AS NEEDED 90 tablet 0   gabapentin (NEURONTIN) 600 MG  tablet Take 1 tablet (600 mg total) by mouth 3 (three) times daily. 90 tablet 3   hydrOXYzine (ATARAX/VISTARIL) 25 MG tablet TAKE 1/2 TO 1 TABLET BY MOUTH TWICE A DAY AS NEEDED FOR SEVERE PANIC ATTACKS/ ANXIETY 180 tablet 1   lamoTRIgine (LAMICTAL) 150 MG tablet Take 0.5 tablets (75 mg total) by mouth 2 (two) times daily. 90 tablet 1   mometasone (ELOCON) 0.1 % cream Apply 1 application topically 2 (two) times daily.     omeprazole (PRILOSEC) 40 MG capsule Take 40 mg by mouth daily.     ondansetron (ZOFRAN ODT) 4 MG disintegrating tablet Take 1 tablet (4 mg total) by mouth every 8 (eight) hours as needed for nausea or vomiting. 30 tablet 2   oxyCODONE-acetaminophen (PERCOCET/ROXICET) 5-325 MG tablet oxycodone-acetaminophen 5 mg-325 mg tablet  Take 1 tablet every 6 hours by oral route for 30 days.     OZEMPIC, 1 MG/DOSE, 4 MG/3ML SOPN INJECT 1MG  INTO THE SKIN ONCE A WEEK 3 mL 2   predniSONE (DELTASONE) 20 MG tablet Take 2 tablets daily (40mg ) for 4 days, take 1 tab daily (20mg ) for 4 days, take half tab daily (10mg ) for 4 days (Patient not taking: Reported on 02/01/2021) 14 tablet 0   propranolol (INDERAL) 10 MG tablet TAKE 1 TABLET THREE TIMES DAILY AS NEEDED FOR SEVERE ANXIETY ATTACKS (LIMITED SUPPLY) 90 tablet 0   risperiDONE (RISPERDAL) 0.5 MG tablet TAKE 1 TABLET BY MOUTH AT BEDTIME. 90 tablet 1   rosuvastatin (CRESTOR) 40 MG tablet TAKE 1 TABLET EVERY DAY 90 tablet 1   tiZANidine (ZANAFLEX) 4 MG tablet Take 4 mg by mouth 4 (four) times daily.     traMADol (ULTRAM) 50 MG tablet Take 50 mg by mouth 4 (four) times daily.     traZODone (DESYREL) 100 MG tablet TAKE 1 TABLET EVERY DAY AT BEDTIME 90 tablet 0   venlafaxine XR (EFFEXOR-XR) 75 MG 24 hr capsule TAKE ONE CAPSULE BY MOUTH DAILY WITH BREAKFAST 90 capsule 1   XIIDRA 5 % SOLN INSTILL 1 DROP INTO BOTH EYES TWICE A DAY (Patient not taking: Reported on 02/01/2021)     No current facility-administered medications for this visit.      Musculoskeletal: Strength & Muscle Tone:  UTA Gait & Station:  Seated Patient leans: N/A  Psychiatric Specialty Exam: Review of Systems  Musculoskeletal:  Positive for back pain (chronic improved).  Psychiatric/Behavioral:  Negative for agitation, behavioral problems, confusion, decreased concentration, dysphoric mood, hallucinations, self-injury, sleep disturbance and suicidal ideas. The patient is not nervous/anxious and is not hyperactive.   All other systems reviewed and are negative.  There were no vitals taken for this visit.There is no height or weight on file to calculate BMI.  General Appearance: Casual  Eye Contact:  Fair  Speech:  Clear and Coherent  Volume:  Normal  Mood:  Euthymic  Affect:  Congruent  Thought Process:  Goal Directed and Descriptions of Associations: Intact  Orientation:  Full (Time, Place, and Person)  Thought Content: Logical   Suicidal Thoughts:  No  Homicidal Thoughts:  No  Memory:  Immediate;   Fair Recent;  Fair Remote;   Fair  Judgement:  Fair  Insight:  Fair  Psychomotor Activity:  Normal  Concentration:  Concentration: Fair and Attention Span: Fair  Recall:  [employer] of Knowledge: Fair  Language: Fair  Akathisia:  No  Handed:  Right  AIMS (if indicated): done,0  Assets:  Communication Skills Desire for Improvement Housing Social Support  ADL's:  Intact  Cognition: WNL  Sleep:  Fair   Screenings: GAD-7    [place] Office Visit from 12/28/2019 in [facility] Office Visit from 03/17/2019 in [facility] Office Visit from 11/20/2018 in [facility] Office Visit from 08/18/2018 in [facility] Office Visit from 07/22/2018 in [facility]  Total GAD-7 Score 2 2 4 9 8       PHQ2-9    Flowsheet Row Video Visit from 08/02/2020 in [place] Video Visit from 07/18/2020 in [place] Video Visit  from 04/25/2020 in [place] from 03/29/2020 in [facility] Office Visit from 12/28/2019 in [place]  PHQ-2 Total Score 1 6 2 4  0  PHQ-9 Total Score 10 21 5 9 1       Flowsheet Row Video Visit from 08/02/2020 in [place] Admission (Discharged) from 06/13/2020 in [place] No Risk No Risk        Assessment and Plan: Ashley Fields is a 66 year old Caucasian female who has a history of PTSD, bipolar disorder, panic disorder, migraine headaches, chronic pain was evaluated by telemedicine today.  Patient is currently improving.  Plan as noted below.  Plan Bipolar disorder in remission Risperidone 0.5 mg p.o. nightly Lamotrigine 150 mg p.o. daily in divided dosage Gabapentin 600 mg p.o. 3 times daily-prescribed by primary care provider.  PTSD-stable Venlafaxine 75 mg p.o. daily Hydroxyzine 25 mg p.o. daily as needed for severe anxiety attacks  Panic disorder-stable Venlafaxine 75 mg p.o. daily  Insomnia-improving Trazodone 50-100 mg p.o. nightly Pain is currently under control.  Weight gain likely secondary to medications-unstable Continue lifestyle modification  High risk medication use-pending prolactin level.  Patient agrees to get it done.  Follow-up in clinic in 2 to 3 months or sooner in person.  This note was generated in part or whole with voice recognition software. Voice recognition is usually quite accurate but there are transcription errors that can and very often do occur. I apologize for any typographical errors that were not detected and corrected.    Ursula Alert, MD 02/20/2021, 12:50 PM

## 2021-02-22 ENCOUNTER — Other Ambulatory Visit: Payer: Self-pay | Admitting: *Deleted

## 2021-02-22 DIAGNOSIS — D508 Other iron deficiency anemias: Secondary | ICD-10-CM

## 2021-02-23 DIAGNOSIS — M48061 Spinal stenosis, lumbar region without neurogenic claudication: Secondary | ICD-10-CM | POA: Diagnosis not present

## 2021-02-23 DIAGNOSIS — M4727 Other spondylosis with radiculopathy, lumbosacral region: Secondary | ICD-10-CM | POA: Diagnosis not present

## 2021-02-26 ENCOUNTER — Other Ambulatory Visit: Payer: Self-pay | Admitting: Psychiatry

## 2021-02-26 ENCOUNTER — Other Ambulatory Visit: Payer: Self-pay | Admitting: Family Medicine

## 2021-02-26 ENCOUNTER — Encounter: Payer: Self-pay | Admitting: Family Medicine

## 2021-02-26 DIAGNOSIS — F4310 Post-traumatic stress disorder, unspecified: Secondary | ICD-10-CM

## 2021-02-26 DIAGNOSIS — K219 Gastro-esophageal reflux disease without esophagitis: Secondary | ICD-10-CM

## 2021-02-27 ENCOUNTER — Encounter: Payer: Self-pay | Admitting: Internal Medicine

## 2021-02-27 NOTE — Telephone Encounter (Signed)
Requested medication (s) are due for refill today: Yes  Requested medication (s) are on the active medication list: Yes  Last refill:  12/12/20  Future visit scheduled:No  Notes to clinic:  Unable to refill per protocol, last refill by another provider.      Requested Prescriptions  Pending Prescriptions Disp Refills   omeprazole (PRILOSEC) 40 MG capsule [Pharmacy Med Name: OMEPRAZOLE DR 40 MG CAPSULE] 90 capsule 1    Sig: TAKE 1 CAPSULE BY MOUTH EVERY DAY     Gastroenterology: Proton Pump Inhibitors Passed - 02/26/2021 11:26 AM      Passed - Valid encounter within last 12 months    Recent Outpatient Visits           2 months ago Acute right-sided low back pain with right-sided sciatica   [place], DO   4 months ago Acute non-recurrent frontal sinusitis   [facility] J[place], Devonne Doughty, DO   10 months ago Annual physical exam   [place], DO   1 year ago Acute viral pharyngitis   [facility] Olin Hauser, DO   1 year ago Morbid obesity [facility])   [facility] [place], Devonne Doughty, DO       Future Appointments             In 1 month  [facility], [place]   In 1 month [place], [facility], [facility]

## 2021-03-01 ENCOUNTER — Encounter: Payer: Self-pay | Admitting: Internal Medicine

## 2021-03-02 ENCOUNTER — Ambulatory Visit (INDEPENDENT_AMBULATORY_CARE_PROVIDER_SITE_OTHER): Payer: Medicare HMO | Admitting: Family Medicine

## 2021-03-02 ENCOUNTER — Other Ambulatory Visit: Payer: Self-pay

## 2021-03-02 ENCOUNTER — Encounter: Payer: Self-pay | Admitting: Family Medicine

## 2021-03-02 VITALS — BP 125/68 | HR 78 | Ht 62.0 in | Wt 209.4 lb

## 2021-03-02 DIAGNOSIS — H9202 Otalgia, left ear: Secondary | ICD-10-CM

## 2021-03-02 DIAGNOSIS — B379 Candidiasis, unspecified: Secondary | ICD-10-CM

## 2021-03-02 DIAGNOSIS — J0110 Acute frontal sinusitis, unspecified: Secondary | ICD-10-CM

## 2021-03-02 MED ORDER — FLUCONAZOLE 150 MG PO TABS
ORAL_TABLET | ORAL | 0 refills | Status: DC
Start: 2021-03-02 — End: 2021-03-14

## 2021-03-02 MED ORDER — AMOXICILLIN-POT CLAVULANATE 875-125 MG PO TABS: 1.0000 | ORAL_TABLET | Freq: Two times a day (BID) | ORAL | 0 refills | Status: DC

## 2021-03-02 NOTE — Progress Notes (Signed)
Subjective:    Patient ID: Valecia Beske, female    DOB: 03/08/1955, 66 y.o.   MRN: [id number]  Kasiah Manka is a 66 y.o. female presenting on 03/02/2021 for Ear Pain and Headache   HPI  Sinusitis L Ear Reports symptoms onset 4 days ago with sinus pain pressure coughing drainage runny nose, headache, describes pain mostly  also admits some drainage out of L ear. - Took some older Amoxicillin 500 BID x 2.5 days ran out now  Update - improved from back pain L5 injection, improved. Able to ambulate without cane   Denies fever chills nausea vomiting dyspnea wheezing diarrhea  Depression screen [facility] 2/9 03/29/2020 12/28/2019 09/21/2019  Decreased Interest 1 0 0  Down, Depressed, Hopeless 3 0 0  PHQ - 2 Score 4 0 0  Altered sleeping 2 1 0  Tired, decreased energy 3 0 0  Change in appetite 0 0 0  Feeling bad or failure about yourself  0 0 0  Trouble concentrating 0 0 0  Moving slowly or fidgety/restless 0 0 0  Suicidal thoughts 0 0 0  PHQ-9 Score 9 1 0  Difficult doing work/chores Somewhat difficult Not difficult at all Not difficult at all  Some encounter information is confidential and restricted. Go to Review Flowsheets activity to see all data.  Some recent data might be hidden    Social History   Tobacco Use   Smoking status: Former    Packs/day: 0.25    Years: 10.00    Pack years: 2.50    Types: Cigarettes    Quit date: 11/29/1995    Years since quitting: 25.2   Smokeless tobacco: Former   Tobacco comments:    Quite 1997, didnt smoke hardly at all when I was smoking.  Vaping Use   Vaping Use: Never used  Substance Use Topics   Alcohol use: No   Drug use: No    Review of Systems Per HPI unless specifically indicated above     Objective:    BP 125/68    Pulse 78    Ht 5\' 2"  (1.575 m)    Wt 209 lb 6.4 oz (95 kg)    SpO2 95%    BMI 38.30 kg/m   Wt Readings from Last 3 Encounters:  03/02/21 209 lb 6.4 oz (95 kg)  02/01/21 207 lb 8 oz (94.1 kg)  12/12/20 206 lb  3.2 oz (93.5 kg)    Physical Exam Vitals and nursing note reviewed.  Constitutional:      General: She is not in acute distress.    Appearance: Normal appearance. She is well-developed. She is obese. She is not diaphoretic.     Comments: Well-appearing, comfortable, cooperative  HENT:     Head: Normocephalic and atraumatic.     Right Ear: Tympanic membrane normal. There is no impacted cerumen.     Left Ear: Ear canal and external ear normal. There is no impacted cerumen.     Ears:     Comments: L TM with effusion fullness some opacity not erythema Eyes:     General:        Right eye: No discharge.        Left eye: No discharge.     Conjunctiva/sclera: Conjunctivae normal.  Neck:     Thyroid: No thyromegaly.  Cardiovascular:     Rate and Rhythm: Normal rate and regular rhythm.     Heart sounds: Normal heart sounds. No murmur heard. Pulmonary:     Effort:  Pulmonary effort is normal. No respiratory distress.     Breath sounds: Normal breath sounds. No wheezing or rales.  Musculoskeletal:        General: Normal range of motion.     Cervical back: Normal range of motion and neck supple.  Lymphadenopathy:     Cervical: No cervical adenopathy.  Skin:    General: Skin is warm and dry.     Findings: No erythema or rash.  Neurological:     Mental Status: She is alert and oriented to person, place, and time.  Psychiatric:        Mood and Affect: Mood normal.        Behavior: Behavior normal.        Thought Content: Thought content normal.     Comments: Well groomed, good eye contact, normal speech and thoughts   Results for orders placed or performed in visit on 09/02/20  Iron and TIBC  Result Value Ref Range   Iron 100 28 - 170 ug/dL   TIBC 444 250 - 450 ug/dL   Saturation Ratios 23 10.4 - 31.8 %   UIBC 344 ug/dL  Ferritin  Result Value Ref Range   Ferritin 15 11 - 307 ng/mL  Basic metabolic panel  Result Value Ref Range   Sodium 139 135 - 145 mmol/L   Potassium 4.2 3.5  - 5.1 mmol/L   Chloride 103 98 - 111 mmol/L   CO2 29 22 - 32 mmol/L   Glucose, Bld 89 70 - 99 mg/dL   BUN 14 8 - 23 mg/dL   Creatinine, Ser 0.78 0.44 - 1.00 mg/dL   Calcium 9.2 8.9 - 10.3 mg/dL   GFR, Estimated >60 >60 mL/min   Anion gap 7 5 - 15  CBC with Differential  Result Value Ref Range   WBC 5.2 4.0 - 10.5 K/uL   RBC 4.33 3.87 - 5.11 MIL/uL   Hemoglobin 13.1 12.0 - 15.0 g/dL   HCT 40.9 36.0 - 46.0 %   MCV 94.5 80.0 - 100.0 fL   MCH 30.3 26.0 - 34.0 pg   MCHC 32.0 30.0 - 36.0 g/dL   RDW 12.4 11.5 - 15.5 %   Platelets 152 150 - 400 K/uL   nRBC 0.0 0.0 - 0.2 %   Neutrophils Relative % 65 %   Neutro Abs 3.3 1.7 - 7.7 K/uL   Lymphocytes Relative 24 %   Lymphs Abs 1.2 0.7 - 4.0 K/uL   Monocytes Relative 8 %   Monocytes Absolute 0.4 0.1 - 1.0 K/uL   Eosinophils Relative 3 %   Eosinophils Absolute 0.2 0.0 - 0.5 K/uL   Basophils Relative 0 %   Basophils Absolute 0.0 0.0 - 0.1 K/uL   Immature Granulocytes 0 %   Abs Immature Granulocytes 0.01 0.00 - 0.07 K/uL      Assessment & Plan:   Problem List Items Addressed This Visit   None Visit Diagnoses     Acute non-recurrent frontal sinusitis    -  Primary   Relevant Medications   amoxicillin-clavulanate (AUGMENTIN) 875-125 MG tablet   fluconazole (DIFLUCAN) 150 MG tablet   Left ear pain       Antibiotic-induced yeast infection       Relevant Medications   fluconazole (DIFLUCAN) 150 MG tablet       Consistent with acute frontal rhinosinusitis, likely initially viral URI vs allergic rhinitis component with worsening concern for bacterial infection. Also possible developing L AOM  Plan: Start Augmentin 875-125mg   PO BID x 10 days 2. Resume Loratadine (Claritin) 10mg  daily and Flonase 2 sprays in each nostril daily for next 4-6 weeks, then may stop and use seasonally or as needed OTC Supportive care Add Diflucan if yeats infxn from abx Return criteria reviewed   Meds ordered this encounter  Medications    amoxicillin-clavulanate (AUGMENTIN) 875-125 MG tablet    Sig: Take 1 tablet by mouth 2 (two) times daily.    Dispense:  20 tablet    Refill:  0   fluconazole (DIFLUCAN) 150 MG tablet    Sig: Take one tablet by mouth on Day 1. Repeat dose 2nd tablet on Day 3.    Dispense:  2 tablet    Refill:  0      Follow up plan: Return if symptoms worsen or fail to improve.   Nobie Putnam, DO [facility] 03/02/2021, 8:08 AM

## 2021-03-02 NOTE — Patient Instructions (Addendum)
Thank you for coming to the office today.  1. It sounds like you have a Sinusitis (Bacterial Infection) - this most likely started as an Upper Respiratory Virus that has settled into an infection. Allergies can also cause this. - Start Augmentin 1 pill twice daily (breakfast and dinner, with food and plenty of water) for 10 days, complete entire course, do not stop early even if feeling better  Keep on Flonase Can take Claritin daily - Recommend to try using Nasal Saline spray multiple times a day to help flush out congestion and clear sinuses - Improve hydration by drinking plenty of clear fluids (water, gatorade) to reduce secretions and thin congestion - Congestion draining down throat can cause irritation. May try warm herbal tea with honey, cough drops - Can take Tylenol or Ibuprofen as needed for fevers - May continue over the counter cold medicine as you are, can use Sudafed decongestant if needed   Please schedule a Follow-up Appointment to: Return if symptoms worsen or fail to improve.  If you have any other questions or concerns, please feel free to call the office or send a message through [place]. You may also schedule an earlier appointment if necessary.  Additionally, you may be receiving a survey about your experience at our office within a few days to 1 week by e-mail or mail. We value your feedback.  Nobie Putnam, DO [place]

## 2021-03-03 ENCOUNTER — Inpatient Hospital Stay: Payer: Medicare HMO | Attending: Internal Medicine

## 2021-03-03 DIAGNOSIS — E559 Vitamin D deficiency, unspecified: Secondary | ICD-10-CM | POA: Insufficient documentation

## 2021-03-03 DIAGNOSIS — D508 Other iron deficiency anemias: Secondary | ICD-10-CM

## 2021-03-03 DIAGNOSIS — D509 Iron deficiency anemia, unspecified: Secondary | ICD-10-CM | POA: Insufficient documentation

## 2021-03-03 DIAGNOSIS — K909 Intestinal malabsorption, unspecified: Secondary | ICD-10-CM | POA: Diagnosis not present

## 2021-03-03 DIAGNOSIS — D696 Thrombocytopenia, unspecified: Secondary | ICD-10-CM | POA: Insufficient documentation

## 2021-03-03 LAB — CBC WITH DIFFERENTIAL/PLATELET
Abs Immature Granulocytes: 0.02 10*3/uL (ref 0.00–0.07)
Basophils Absolute: 0 10*3/uL (ref 0.0–0.1)
Basophils Relative: 1 %
Eosinophils Absolute: 0.2 10*3/uL (ref 0.0–0.5)
Eosinophils Relative: 4 %
HCT: 46.6 % — ABNORMAL HIGH (ref 36.0–46.0)
Hemoglobin: 14.9 g/dL (ref 12.0–15.0)
Immature Granulocytes: 0 %
Lymphocytes Relative: 22 %
Lymphs Abs: 1.2 10*3/uL (ref 0.7–4.0)
MCH: 30.7 pg (ref 26.0–34.0)
MCHC: 32 g/dL (ref 30.0–36.0)
MCV: 95.9 fL (ref 80.0–100.0)
Monocytes Absolute: 0.4 10*3/uL (ref 0.1–1.0)
Monocytes Relative: 7 %
Neutro Abs: 3.7 10*3/uL (ref 1.7–7.7)
Neutrophils Relative %: 66 %
Platelets: 178 10*3/uL (ref 150–400)
RBC: 4.86 MIL/uL (ref 3.87–5.11)
RDW: 12.7 % (ref 11.5–15.5)
WBC: 5.5 10*3/uL (ref 4.0–10.5)
nRBC: 0 % (ref 0.0–0.2)

## 2021-03-03 LAB — IRON AND TIBC
Iron: 122 ug/dL (ref 28–170)
Saturation Ratios: 24 % (ref 10.4–31.8)
TIBC: 507 ug/dL — ABNORMAL HIGH (ref 250–450)
UIBC: 385 ug/dL

## 2021-03-03 LAB — VITAMIN B12: Vitamin B-12: 573 pg/mL (ref 180–914)

## 2021-03-03 LAB — FERRITIN: Ferritin: 15 ng/mL (ref 11–307)

## 2021-03-06 ENCOUNTER — Other Ambulatory Visit: Payer: Self-pay

## 2021-03-06 ENCOUNTER — Encounter: Payer: Self-pay | Admitting: Internal Medicine

## 2021-03-06 ENCOUNTER — Inpatient Hospital Stay: Payer: Medicare HMO

## 2021-03-06 ENCOUNTER — Inpatient Hospital Stay: Payer: Medicare HMO | Admitting: Internal Medicine

## 2021-03-06 DIAGNOSIS — K909 Intestinal malabsorption, unspecified: Secondary | ICD-10-CM | POA: Diagnosis not present

## 2021-03-06 DIAGNOSIS — D696 Thrombocytopenia, unspecified: Secondary | ICD-10-CM | POA: Diagnosis not present

## 2021-03-06 DIAGNOSIS — D509 Iron deficiency anemia, unspecified: Secondary | ICD-10-CM | POA: Diagnosis not present

## 2021-03-06 DIAGNOSIS — E559 Vitamin D deficiency, unspecified: Secondary | ICD-10-CM | POA: Diagnosis not present

## 2021-03-06 DIAGNOSIS — D508 Other iron deficiency anemias: Secondary | ICD-10-CM | POA: Diagnosis not present

## 2021-03-06 MED ORDER — SODIUM CHLORIDE 0.9 % IV SOLN
Freq: Once | INTRAVENOUS | Status: AC
  Filled 2021-03-06: qty 250

## 2021-03-06 MED ORDER — IRON SUCROSE 20 MG/ML IV SOLN
200.0000 mg | Freq: Once | INTRAVENOUS | Status: AC
  Administered 2021-03-06: 200 mg via INTRAVENOUS
  Filled 2021-03-06: qty 10

## 2021-03-06 NOTE — Progress Notes (Signed)
[facility] NOTE  Patient Care Team: Olin Hauser, DO as PCP - General (Family Medicine)  CHIEF COMPLAINTS/PURPOSE OF CONSULTATION: Iron deficiency   HEMATOLOGY HISTORY  # ANEMIA IRON DEFICIENCY GASTRIC BYPASS [2005 [place]] EGD-none; colonoscopy-2015- few polyps snared [[place]]; IV venofer x3; fall 2020/ PO B12 [intol to SL b12]    Latest Reference Range & Units 03/03/21 10:15  Iron 28 - 170 ug/dL 122  UIBC ug/dL 385  TIBC 250 - 450 ug/dL 507 (H)  Saturation Ratios 10.4 - 31.8 % 24  Ferritin 11 - 307 ng/mL 15  Vitamin B12 180 - 914 pg/mL 573  (H): Data is abnormally high  HISTORY OF PRESENTING ILLNESS:  Ashley Fields 65 y.o.  female history of iron deficiency secondary to gastric bypass is here for follow-up.  Complains of mild to moderate fatigue.  Otherwise no blood in stools or black or stools.   Review of Systems  Constitutional:  Positive for malaise/fatigue. Negative for chills, diaphoresis, fever and weight loss.  HENT:  Negative for nosebleeds and sore throat.   Eyes:  Negative for double vision.  Respiratory:  Negative for cough, hemoptysis, sputum production and wheezing.   Cardiovascular:  Negative for chest pain, palpitations, orthopnea and leg swelling.  Gastrointestinal:  Negative for abdominal pain, blood in stool, constipation, diarrhea, heartburn, melena, nausea and vomiting.  Genitourinary:  Negative for dysuria, frequency and urgency.  Musculoskeletal:  Negative for back pain and joint pain.  Skin: Negative.  Negative for itching and rash.  Neurological:  Negative for dizziness, tingling, focal weakness, weakness and headaches.  Endo/Heme/Allergies:  Does not bruise/bleed easily.  Psychiatric/Behavioral:  Negative for depression. The patient is not nervous/anxious and does not have insomnia.    MEDICAL HISTORY:  Past Medical History:  Diagnosis Date   Allergy    Anemia    Anxiety    Arthritis    Yes   Blood  transfusion without reported diagnosis 1977   Transfusions from miscarriage & hemorrhaging   Chronic kidney disease    Depression    Disp fx of cuboid bone of right foot, init for clos fx 01/01/2017   GERD (gastroesophageal reflux disease)    Headache    Hepatitis C    Hyperlipidemia    Neuromuscular disorder ([place]) 1997   Falling to much was an eye-opener   Osteoporosis    Thyroid disease    Urine incontinence     SURGICAL HISTORY: Past Surgical History:  Procedure Laterality Date   5 miscarriages     1977-1985, Blood transfusion s/p miscarriage [place]   Total   APPENDECTOMY  12/2008   AUGMENTATION MAMMAPLASTY Bilateral 2015   Bilat   AUGMENTATION MAMMAPLASTY Bilateral 2018   implants redone w/ placement of implants under muscle   breast lift bilateral, implants  02/77/4128   bilateral, silicon naturel   BREAST REDUCTION SURGERY Bilateral 1997   CESAREAN SECTION  07/27/1981   Placenta Previa   CHOLECYSTECTOMY  03/2004   Lap surgery   COLONOSCOPY WITH PROPOFOL N/A 06/13/2020   Procedure: COLONOSCOPY WITH PROPOFOL;  Surgeon: Virgel Manifold, MD;  Location: [facility] ENDOSCOPY;  Service: Endoscopy;  Laterality: N/A;   COSMETIC SURGERY     Breast implants w/ lift, tummy tuck, upper & lower Blepharop   ESOPHAGOGASTRODUODENOSCOPY (EGD) WITH PROPOFOL N/A 06/13/2020   Procedure: ESOPHAGOGASTRODUODENOSCOPY (EGD) WITH PROPOFOL;  Surgeon: Virgel Manifold, MD;  Location: [facility] ENDOSCOPY;  Service: Endoscopy;  Laterality: N/A;  EYE SURGERY  2017   Cataract surgery & lasik   GASTRIC BYPASS  2005   Laparoscopic   IVC FILTER INSERTION  12/2003   TrapEase Vena Cava Filter   LUMBAR LAMINECTOMY  06/2006   L4-L5 (spinal fusion)   mini tummy tuck  05/07/2016   Bilateral bra/back roll lift skin removal   neck surgery C3-C7  02/10/2015   ACDF   REDUCTION MAMMAPLASTY Bilateral 1997   SHOULDER ACROMIOPLASTY Left 03/27/2013   w/ labral debridement   SMALL  INTESTINE SURGERY  12/24/2003   Lap Gastric Bypass   SPINAL CORD STIMULATOR IMPLANT  11/2010   removed 05/2011   SPINE SURGERY  2008 2017   Fusion L4-L5, ACDF C4-C7    SOCIAL HISTORY: Social History   Socioeconomic History   Marital status: Married    Spouse name: Not on file   Number of children: 1   Years of education: High School   Highest education level: Some college, no degree  Occupational History   Occupation: disability  Tobacco Use   Smoking status: Former    Packs/day: 0.25    Years: 10.00    Pack years: 2.50    Types: Cigarettes    Quit date: 11/29/1995    Years since quitting: 25.2   Smokeless tobacco: Former   Tobacco comments:    Quite 1997, didnt smoke hardly at all when I was smoking.  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]   Social Determinants of Radio broadcast assistant Strain: Low Risk    Difficulty of Paying Living Expenses: Not hard at all  Food Insecurity: No Food Insecurity   Worried About Charity fundraiser in the Last Year: Never true   Arboriculturist in the Last Year: Never true  Transportation Needs: No Transportation Needs   Lack of Transportation (Medical): No   Lack of Transportation (Non-Medical): No  Physical Activity: Inactive   Days of Exercise per Week: 0 days   Minutes of Exercise per Session: 0 min  Stress: Stress Concern Present   Feeling of Stress : Very much  Social Connections: Not on file  Intimate Partner Violence: Not on file    FAMILY HISTORY: Family History  Problem Relation Age of Onset   COPD Mother    Lung cancer Mother    Diabetes Mother    Heart  disease Mother    Stroke Mother    Heart attack Mother    Arthritis Mother    Cancer Mother    Hypertension Mother    Obesity Mother    Varicose Veins Mother    Heart disease Father    Heart attack Father 64   Early death Father    Depression Sister        x 5 sisters   COPD Sister    Hypertension Sister    Heart disease Brother        x 3 brothers   Diabetes Brother    Colon polyps Sister    Hearing loss Sister    Heart attack Sister    Heart attack Brother  Arthritis Sister    Varicose Veins Sister    Arthritis Brother    Heart disease Brother    Hypertension Brother    Cancer Maternal Grandfather    Stroke Maternal Grandfather    COPD Sister    Obesity Sister    Depression Sister    Depression Sister    Heart disease Sister    Hypertension Sister    Diabetes Brother    Heart disease Brother    Hypertension Brother    Obesity Brother    Varicose Veins Brother    Heart disease Brother    Hypertension Brother    Obesity Brother    Hypertension Sister    Obesity Sister    Miscarriages / Stillbirths Maternal Aunt    Miscarriages / Stillbirths Paternal Aunt    Breast cancer Neg Hx     ALLERGIES:  is allergic to flagyl [metronidazole], cymbalta [duloxetine hcl], and tape.  MEDICATIONS:  Current Outpatient Medications  Medication Sig Dispense Refill   amoxicillin-clavulanate (AUGMENTIN) 875-125 MG tablet Take 1 tablet by mouth 2 (two) times daily. 20 tablet 0   ASPIRIN LOW DOSE 81 MG EC tablet TAKE 1 TABLET BY MOUTH EVERY DAY 90 tablet 3   butalbital-acetaminophen-caffeine (FIORICET) 50-325-40 MG tablet Take 1 tablet by mouth 2 (two) times daily as needed for headache.     Cholecalciferol 250 MCG (10000 UT) CAPS Take by mouth.     cyanocobalamin (,VITAMIN B-12,) 1000 MCG/ML injection Inject 1,000 mcg into the muscle every 30 (thirty) days.     diclofenac Sodium (VOLTAREN) 1 % GEL Apply 2 g topically 4 (four) times daily.     estradiol (ESTRACE) 1 MG tablet  TAKE 1 TABLET EVERY DAY 90 tablet 1   etodolac (LODINE) 500 MG tablet TAKE 1 TABLET BY MOUTH TWICE A DAY 180 tablet 1   ezetimibe (ZETIA) 10 MG tablet TAKE ONE TABLET BY MOUTH ONE TIME DAILY 90 tablet 1   fluconazole (DIFLUCAN) 150 MG tablet Take one tablet by mouth on Day 1. Repeat dose 2nd tablet on Day 3. 2 tablet 0   fluticasone (FLONASE) 50 MCG/ACT nasal spray SPRAY 2 SPRAYS INTO EACH NOSTRIL EVERY DAY 48 mL 2   furosemide (LASIX) 20 MG tablet TAKE 1 TABLET EVERY DAY FOR FLUID OR EDEMA AS NEEDED 90 tablet 0   gabapentin (NEURONTIN) 600 MG tablet Take 1 tablet (600 mg total) by mouth 3 (three) times daily. 90 tablet 3   hydrOXYzine (ATARAX/VISTARIL) 25 MG tablet TAKE 1/2 TO 1 TABLET BY MOUTH TWICE A DAY AS NEEDED FOR SEVERE PANIC ATTACKS/ ANXIETY 180 tablet 1   lamoTRIgine (LAMICTAL) 150 MG tablet Take 0.5 tablets (75 mg total) by mouth 2 (two) times daily. 90 tablet 1   mometasone (ELOCON) 0.1 % cream Apply 1 application topically 2 (two) times daily.     omeprazole (PRILOSEC) 40 MG capsule TAKE 1 CAPSULE BY MOUTH EVERY DAY 90 capsule 1   ondansetron (ZOFRAN ODT) 4 MG disintegrating tablet Take 1 tablet (4 mg total) by mouth every 8 (eight) hours as needed for nausea or vomiting. 30 tablet 2   oxyCODONE-acetaminophen (PERCOCET/ROXICET) 5-325 MG tablet oxycodone-acetaminophen 5 mg-325 mg tablet  Take 1 tablet every 6 hours by oral route for 30 days.     OZEMPIC, 1 MG/DOSE, 4 MG/3ML SOPN INJECT 1MG  INTO THE SKIN ONCE A WEEK 3 mL 2   propranolol (INDERAL) 10 MG tablet TAKE 1 TABLET THREE TIMES DAILY AS NEEDED FOR SEVERE ANXIETY ATTACKS (LIMITED SUPPLY)  90 tablet 0   risperiDONE (RISPERDAL) 0.5 MG tablet TAKE 1 TABLET BY MOUTH AT BEDTIME. 90 tablet 1   rosuvastatin (CRESTOR) 40 MG tablet TAKE 1 TABLET EVERY DAY 90 tablet 1   tiZANidine (ZANAFLEX) 4 MG tablet Take 4 mg by mouth 4 (four) times daily.     traZODone (DESYREL) 100 MG tablet TAKE 1 TABLET EVERY DAY AT BEDTIME 90 tablet 0   venlafaxine  XR (EFFEXOR-XR) 75 MG 24 hr capsule TAKE ONE CAPSULE BY MOUTH DAILY WITH BREAKFAST 90 capsule 1   XIIDRA 5 % SOLN      traMADol (ULTRAM) 50 MG tablet Take 50 mg by mouth 4 (four) times daily. (Patient not taking: Reported on 03/06/2021)     No current facility-administered medications for this visit.   Facility-Administered Medications Ordered in Other Visits  Medication Dose Route Frequency Provider Last Rate Last Admin   0.9 %  sodium chloride infusion   Intravenous Once Charlaine Dalton R, MD       iron sucrose (VENOFER) injection 200 mg  200 mg Intravenous Once Charlaine Dalton R, MD          PHYSICAL EXAMINATION:   Vitals:   03/06/21 1300  BP: 128/80  Pulse: 77  Resp: 16  Temp: (!) 97.2 F (36.2 C)   Filed Weights   03/06/21 1300  Weight: 208 lb (94.3 kg)    Physical Exam HENT:     Head: Normocephalic and atraumatic.     Mouth/Throat:     Pharynx: No oropharyngeal exudate.  Eyes:     Pupils: Pupils are equal, round, and reactive to light.  Cardiovascular:     Rate and Rhythm: Normal rate and regular rhythm.  Pulmonary:     Effort: Pulmonary effort is normal. No respiratory distress.     Breath sounds: Normal breath sounds. No wheezing.  Abdominal:     General: Bowel sounds are normal. There is no distension.     Palpations: Abdomen is soft. There is no mass.     Tenderness: There is no abdominal tenderness. There is no guarding or rebound.  Musculoskeletal:        General: No tenderness. Normal range of motion.     Cervical back: Normal range of motion and neck supple.  Skin:    General: Skin is warm.  Neurological:     Mental Status: She is alert and oriented to person, place, and time.  Psychiatric:        Mood and Affect: Affect normal.    LABORATORY DATA:  I have reviewed the data as listed Lab Results  Component Value Date   WBC 5.5 03/03/2021   HGB 14.9 03/03/2021   HCT 46.6 (H) 03/03/2021   MCV 95.9 03/03/2021   PLT 178 03/03/2021    Recent Labs    03/30/20 0802 09/02/20 1050  NA 142 139  K 4.6 4.2  CL 103 103  CO2 32 29  GLUCOSE 85 89  BUN 15 14  CREATININE 0.96 0.78  CALCIUM 9.4 9.2  GFRNONAA 63 >60  GFRAA 73  --   PROT 6.8  --   AST 26  --   ALT 23  --   BILITOT 0.8  --      No results found.  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  On IV iron.  Positive fatigue. STABLE.   # Proceed with Venofer today.  Hemoglobin 14; saturation 23%;Ferritin-15;  continue maintenance iron infusion.   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  Stable  # Mild intermittent thrombocytopenia-178- normal.   # DISPOSITION:  #  VENOFER TODAY # follow up in 6 months MD- labs-1-2 days PRIOR cbc/bmp/b12/ iron studies/ferrtin; Vit D-OH levels- possible VENOFER- Dr.B  All questions were answered. The patient knows to call the clinic with any problems, questions or concerns.    Cammie Sickle, MD 03/06/2021 1:27 PM

## 2021-03-06 NOTE — Progress Notes (Signed)
Patient denies new problems/concerns today.   °

## 2021-03-06 NOTE — Patient Instructions (Signed)

## 2021-03-06 NOTE — Assessment & Plan Note (Addendum)
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  On IV iron.  Positive fatigue. STABLE.   # Proceed with Venofer today.  Hemoglobin 14; saturation 23%;Ferritin-15;  continue maintenance iron infusion.   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  Stable  # Mild intermittent thrombocytopenia-178- normal.   # DISPOSITION:  #  VENOFER TODAY # follow up in 6 months MD- labs-1-2 days PRIOR cbc/bmp/b12/ iron studies/ferrtin; Vit D-OH levels- possible VENOFER- Dr.B

## 2021-03-08 ENCOUNTER — Other Ambulatory Visit: Payer: Self-pay | Admitting: Family Medicine

## 2021-03-08 DIAGNOSIS — Z8249 Family history of ischemic heart disease and other diseases of the circulatory system: Secondary | ICD-10-CM

## 2021-03-08 DIAGNOSIS — I25118 Atherosclerotic heart disease of native coronary artery with other forms of angina pectoris: Secondary | ICD-10-CM

## 2021-03-08 NOTE — Telephone Encounter (Signed)
Requested Prescriptions  ?Pending Prescriptions Disp Refills  ?? OZEMPIC, 1 MG/DOSE, 4 MG/3ML SOPN [Pharmacy Med Name: OZEMPIC 1 MG/DOSE (4 MG/3 ML)] 3 mL 2  ?  Sig: INJECT 1MG  INTO THE SKIN ONCE A WEEK  ?  ? Endocrinology:  Diabetes - GLP-1 Receptor Agonists - semaglutide Failed - 03/08/2021  2:52 PM  ?  ?  Failed - HBA1C in normal range and within 180 days  ?  Hgb A1c MFr Bld  ?Date Value Ref Range Status  ?03/30/2020 5.0 <5.7 % of total Hgb Final  ?  Comment:  ?  For the purpose of screening for the presence of ?diabetes: ?. ?<5.7%       Consistent with the absence of diabetes ?5.7-6.4%    Consistent with increased risk for diabetes ?            (prediabetes) ?> or =6.5%  Consistent with diabetes ?Marland Kitchen ?This assay result is consistent with a decreased risk ?of diabetes. ?. ?Currently, no consensus exists regarding use of ?hemoglobin A1c for diagnosis of diabetes in children. ?. ?According to American Diabetes Association (ADA) ?guidelines, hemoglobin A1c <7.0% represents optimal ?control in non-pregnant diabetic patients. Different ?metrics may apply to specific patient populations.  ?Standards of Medical Care in Diabetes(ADA). ?. ?  ?   ?  ?  Passed - Cr in normal range and within 360 days  ?  Creat  ?Date Value Ref Range Status  ?03/30/2020 0.96 0.50 - 0.99 mg/dL Final  ?  Comment:  ?  For patients >45 years of age, the reference limit ?for Creatinine is approximately 13% higher for people ?identified as African-American. ?. ?  ? ?Creatinine, Ser  ?Date Value Ref Range Status  ?09/02/2020 0.78 0.44 - 1.00 mg/dL Final  ?   ?  ?  Passed - Valid encounter within last 6 months  ?  Recent Outpatient Visits   ?      ? 6 days ago Acute non-recurrent frontal sinusitis  ? Morehouse, DO  ? 2 months ago Acute right-sided low back pain with right-sided sciatica  ? Exeter, DO  ? 4 months ago Acute non-recurrent frontal sinusitis  ? Benton, DO  ? 10 months ago Annual physical exam  ? Jennings, DO  ? 1 year ago Acute viral pharyngitis  ? Chicken, DO  ?  ?  ?Future Appointments   ?        ? In 4 weeks  [facility], [place]  ? In 1 month Karamalegos, Devonne Doughty, DO [facility] [place]  ?  ? ?  ?  ?  ? ?

## 2021-03-10 ENCOUNTER — Encounter: Payer: Self-pay | Admitting: Family Medicine

## 2021-03-10 NOTE — Telephone Encounter (Signed)
Same topic. Already answered previous message today. ? ?Nobie Putnam, DO ?[facility] ?[facility] ?03/10/2021, 6:08 PM ? ?

## 2021-03-11 ENCOUNTER — Emergency Department: Payer: Medicare HMO

## 2021-03-11 ENCOUNTER — Other Ambulatory Visit: Payer: Self-pay

## 2021-03-11 ENCOUNTER — Other Ambulatory Visit: Payer: Self-pay | Admitting: Psychiatry

## 2021-03-11 ENCOUNTER — Emergency Department
Admission: EM | Admit: 2021-03-11 | Discharge: 2021-03-12 | Disposition: A | Discharge status: Home / Self Care | Payer: Medicare HMO | Attending: Emergency Medicine | Admitting: Emergency Medicine

## 2021-03-11 ENCOUNTER — Encounter: Payer: Self-pay | Admitting: Cardiovascular Disease

## 2021-03-11 DIAGNOSIS — R079 Chest pain, unspecified: Secondary | ICD-10-CM | POA: Diagnosis not present

## 2021-03-11 DIAGNOSIS — F4310 Post-traumatic stress disorder, unspecified: Secondary | ICD-10-CM

## 2021-03-11 DIAGNOSIS — G43909 Migraine, unspecified, not intractable, without status migrainosus: Secondary | ICD-10-CM | POA: Diagnosis not present

## 2021-03-11 DIAGNOSIS — R0602 Shortness of breath: Secondary | ICD-10-CM | POA: Diagnosis not present

## 2021-03-11 DIAGNOSIS — R519 Headache, unspecified: Secondary | ICD-10-CM | POA: Diagnosis not present

## 2021-03-11 DIAGNOSIS — F3176 Bipolar disorder, in full remission, most recent episode depressed: Secondary | ICD-10-CM

## 2021-03-11 DIAGNOSIS — M79602 Pain in left arm: Secondary | ICD-10-CM | POA: Diagnosis not present

## 2021-03-11 DIAGNOSIS — F410 Panic disorder [episodic paroxysmal anxiety] without agoraphobia: Secondary | ICD-10-CM

## 2021-03-11 LAB — BASIC METABOLIC PANEL
Anion gap: 7 (ref 5–15)
BUN: 18 mg/dL (ref 8–23)
CO2: 25 mmol/L (ref 22–32)
Calcium: 8.9 mg/dL (ref 8.9–10.3)
Chloride: 106 mmol/L (ref 98–111)
Creatinine, Ser: 0.92 mg/dL (ref 0.44–1.00)
GFR, Estimated: >60 mL/min (ref >60)
Glucose, Bld: 108 mg/dL — ABNORMAL HIGH (ref 70–99)
Potassium: 4 mmol/L (ref 3.5–5.1)
Sodium: 138 mmol/L (ref 135–145)

## 2021-03-11 LAB — CBC
HCT: 42.6 % (ref 36.0–46.0)
Hemoglobin: 13.8 g/dL (ref 12.0–15.0)
MCH: 30.5 pg (ref 26.0–34.0)
MCHC: 32.4 g/dL (ref 30.0–36.0)
MCV: 94.2 fL (ref 80.0–100.0)
Platelets: 177 10*3/uL (ref 150–400)
RBC: 4.52 MIL/uL (ref 3.87–5.11)
RDW: 12.7 % (ref 11.5–15.5)
WBC: 5.6 10*3/uL (ref 4.0–10.5)
nRBC: 0 % (ref 0.0–0.2)

## 2021-03-11 LAB — TROPONIN I (HIGH SENSITIVITY)
Troponin I (High Sensitivity): <2 ng/L (ref <18)
Troponin I (High Sensitivity): <2 ng/L (ref <18)

## 2021-03-11 MED ORDER — DIPHENHYDRAMINE HCL 50 MG/ML IJ SOLN
50.0000 mg | Freq: Once | INTRAMUSCULAR | Status: AC
  Administered 2021-03-11: 50 mg via INTRAVENOUS
  Filled 2021-03-11: qty 1

## 2021-03-11 MED ORDER — KETOROLAC TROMETHAMINE 30 MG/ML IJ SOLN
30.0000 mg | Freq: Once | INTRAMUSCULAR | Status: AC
  Administered 2021-03-11: 30 mg via INTRAVENOUS
  Filled 2021-03-11: qty 1

## 2021-03-11 MED ORDER — SODIUM CHLORIDE 0.9 % IV BOLUS
1000.0000 mL | Freq: Once | INTRAVENOUS | Status: AC
  Administered 2021-03-11: 1000 mL via INTRAVENOUS

## 2021-03-11 MED ORDER — METOCLOPRAMIDE HCL 5 MG/ML IJ SOLN
10.0000 mg | Freq: Once | INTRAMUSCULAR | Status: AC
  Administered 2021-03-11: 10 mg via INTRAVENOUS
  Filled 2021-03-11: qty 2

## 2021-03-11 NOTE — ED Provider Notes (Signed)
? ?[facility] ?Provider Note ? ? ? Event Date/Time  ? First MD Initiated Contact with Patient 03/11/21 2027   ?  (approximate) ? ?History  ? ?Chief Complaint: Chest Pain and Shortness of Breath ? ?HPI ? ?Ashley Fields is a 66 y.o. female with a past medical history of anxiety gastric reflux, depression, presents to the emergency department for left-sided headache and chest pain with left arm pain.  According to the patient for the past 2 days she has been experiencing pain in the left side of her head consistent with her migraines.  Patient has taken Fioricet at home which has helped somewhat.  Patient states a history of migraine headaches, states she has had to go to emergency department previously when she lived in [place] for headaches, but has not required going to the emergency department since moving to [place] several years ago.  Patient also states for the past 2 days she has been experiencing a dull ache in her left side of her chest and somewhat into the left arm.  Denies any fever or cough.  Patient states she does not typically get chest pain or arm pain with her migraines. ? ?Physical Exam  ? ?Triage Vital Signs: ?ED Triage Vitals  ?Enc Vitals Group  ?   BP 03/11/21 2001 122/76  ?   Pulse Rate 03/11/21 2001 69  ?   Resp 03/11/21 2001 19  ?   Temp 03/11/21 2001 98.3 ?F (36.8 ?C)  ?   Temp Source 03/11/21 2001 Oral  ?   SpO2 03/11/21 2001 97 %  ?   Weight 03/11/21 2002 189 lb (85.7 kg)  ?   Height 03/11/21 2002 '5\' 2"'$  (1.575 m)  ?   Head Circumference --   ?   Peak Flow --   ?   Pain Score 03/11/21 2002 4  ?   Pain Loc --   ?   Pain Edu? --   ?   Excl. in [place]? --   ? ? ?Most recent vital signs: ?Vitals:  ? 03/11/21 2001  ?BP: 122/76  ?Pulse: 69  ?Resp: 19  ?Temp: 98.3 ?F (36.8 ?C)  ?SpO2: 97%  ? ? ?General: Awake, no distress.  ?CV:  Good peripheral perfusion.  Regular rate and rhythm  ?Resp:  Normal effort.  Equal breath sounds bilaterally.  ?Abd:  No distention.  Soft,  nontender.  No rebound or guarding. ?Other:  Equal grip strength bilaterally.  No pronator drift.  Cranial nerves appear intact. ? ? ?ED Results / Procedures / Treatments  ? ?EKG ? ?EKG viewed and interpreted by myself shows a normal sinus rhythm at 63 bpm with a narrow QRS, normal axis, normal intervals, no concerning ST changes. ? ?RADIOLOGY ? ?I have personally reviewed the chest x-ray images, no acute abnormality seen on my evaluation. ?Chest x-ray read as chronic appearing lung markings without evidence of acute abnormality. ? ? ?MEDICATIONS ORDERED IN ED: ?Medications - No data to display ? ? ?IMPRESSION / MDM / ASSESSMENT AND PLAN / ED COURSE  ?I reviewed the triage vital signs and the nursing notes. ? ?Patient presents to the emergency department for left-sided headache x2 days as well as left-sided chest pain with some radiation to the left arm.  Differential is quite broad but would include migraine, less likely CVA or ICH, ACS, pneumonia, pneumothorax, chest wall pain.  We will check labs including cardiac enzymes, obtain chest x-ray obtain CT scan of the head, treat the  patient's migraine and continue to closely monitor. ? ?Patient's work-up is overall reassuring.  Lab work is normal including a normal chemistry, normal CBC.  Negative troponin.  Patient CT scan of the head appears normal.  Chest x-ray normal.  Patient has received a migraine cocktail of medications including Toradol, Reglan, Benadryl and IV fluids. ? ?After reassessment patient states her headache is now gone.  Denies any chest pain.  Suspect chest pain could possibly be related to the patient's migraine.  Given the patient's chest pain with left arm complaint and headache I did consider admission however given the patient's reassuring imaging lab work and physical exam as well as resolution of symptoms following migraine treatment I believe the patient will be safe for discharge home with outpatient follow-up.  Patient  agreeable. ? ?FINAL CLINICAL IMPRESSION(S) / ED DIAGNOSES  ? ?Left-sided headache ?Left-sided chest pain ?Migraine ? ?Rx / DC Orders  ? ?PCP follow-up ?Rest and fluids ? ?Note:  This document was prepared using Dragon voice recognition software and may include unintentional dictation errors. ?  Harvest Dark, MD ?03/11/21 2319 ? ?

## 2021-03-11 NOTE — ED Notes (Signed)
Awake and alert. Rates HA pain 1-2/10. Throbbing type pain in temporal area. . Radiates into L chest and arm.  ?

## 2021-03-11 NOTE — ED Triage Notes (Signed)
Pt presents to ER c/o chest pain, and sob that started today.  Pt states she was hypertensive yesterday, and today, states she took her BP again and was hypotensive, and began developing left sided chest pain and sob.  Pt c/o tightness in her chest at this time.  Pt A&O x4 in triage, speaking in complete sentences.  ?

## 2021-03-14 ENCOUNTER — Ambulatory Visit (INDEPENDENT_AMBULATORY_CARE_PROVIDER_SITE_OTHER): Payer: Medicare HMO | Admitting: Family Medicine

## 2021-03-14 ENCOUNTER — Other Ambulatory Visit: Payer: Self-pay

## 2021-03-14 ENCOUNTER — Encounter: Payer: Self-pay | Admitting: Family Medicine

## 2021-03-14 VITALS — BP 124/63 | HR 76 | Ht 62.0 in | Wt 209.4 lb

## 2021-03-14 DIAGNOSIS — G43001 Migraine without aura, not intractable, with status migrainosus: Secondary | ICD-10-CM

## 2021-03-14 DIAGNOSIS — R0989 Other specified symptoms and signs involving the circulatory and respiratory systems: Secondary | ICD-10-CM | POA: Diagnosis not present

## 2021-03-14 MED ORDER — RIZATRIPTAN BENZOATE 10 MG PO TBDP: 10.0000 mg | ORAL_TABLET | ORAL | 3 refills | Status: AC | PRN

## 2021-03-14 NOTE — Patient Instructions (Addendum)
Thank you for coming to the office today. ? ?1. You most likely have Chronic Migraine Headaches ?- Migraine headaches present differently for many patients, pain is usually throbbing or aching, often on one side of head or behind the eye. They tend to last for up to hours or days. In treating migraines, our goal is to 1) stop the headache and 2) prevent recurrence of headaches ? ?Treatment to STOP the headache at this time: ?- Start with Rizatriptan '10mg'$  - take 1 immediately at onset of moderate to severe migraine headache, if unresolved or return within 2 hours then repeat dose 1 tablet, that is max dose for 24 hours. (Note - this medication can cause a brief episode of flushing and chest pressure or pain very soon after taking it. That is NORMAL, and it is the medicine taking effect and dilating some blood vessels. It should pass, and resolve within seconds to minutes after - it may not happen at all) ? ?- Try this for 1-2 weeks, if absolutely NOT helping then contact me to discuss changes, possibly can double sumatriptan dose or try nasal spray ?- You can still take over the counter meds with Ibuprofen up to 600-'800mg'$  per dose 3 times a day with food for a few days and may try Excedrin Migraine as needed, ONLY use these after you have tried Sumatriptan up to 2 doses, and try to limit their use if they are not effective ? ?Treatment to PREVENT headaches: ?- Goal is to avoid triggers. We need to learn more details on what are your exact or possible headache triggers first. ?- Keep detailed headache diary (on printed handout) for possible triggers, bring this to your next visit to discuss further ?- Known possible triggers include caffeine, chocolate, alcohol, stress, weather changes, menstrual cycle, certain other foods ?- Also be aware that OTC pain meds/anti-inflammatories can cause rebound headache, they help resolve the headache but then after the effect wears off they can CAUSE a headache. Try to taper down  and stop these medications and allow them to get out of your system for 1-2 weeks  ? ?Future meds to try - Nurtec ODT next option for stopping migraines ? ? ?Please schedule a Follow-up Appointment to: Return if symptoms worsen or fail to improve. ? ?If you have any other questions or concerns, please feel free to call the office or send a message through [place]. You may also schedule an earlier appointment if necessary. ? ?Additionally, you may be receiving a survey about your experience at our office within a few days to 1 week by e-mail or mail. We value your feedback. ? ?Nobie Putnam, DO ?[facility] ?

## 2021-03-14 NOTE — Progress Notes (Signed)
? ?Subjective:  ? ? Patient ID: Emilija Bohman, female    DOB: 03-Feb-1955, 66 y.o.   MRN: [id number] ? ?Deeandra Jerry is a 66 y.o. female presenting on 03/14/2021 for Hospitalization Follow-up and Migraine ? ? ?HPI ? ?ED FOLLOW-UP VISIT ? ?Hospital/Location: [facility] ?Date of ED Visit: 03/11/21 ? ?Reason for Presenting to ED: Migraine Headache / Hypertension ? ?FOLLOW-UP  ?- ED provider note and record have been reviewed ?- Patient presents today about 3 days after recent ED visit. Brief summary of recent course, patient had symptoms of elevated BP and Headache severe worsening, presented to ED on 03/11/21, testing in ED with lab work EKG CXR Head CT, treated with Toradol IV, Metoclopramide, Diphenhydramine for migraine cocktail. ?- Today reports overall has done well after discharge from ED. Symptoms of migraine headache has resolved. Her BP was labile with elevated initially up to 140-160+ and then reduced BP at times. Related to headache, had initially sensitivity to light and nausea, these have improved ?- Has Fioricet PRN but not helpful. ? ?I have reviewed the discharge medication list, and have reconciled the current and discharge medications today. ? ? ?Depression screen [facility] 2/9 03/14/2021 03/29/2020 12/28/2019  ?Decreased Interest 1 1 0  ?Down, Depressed, Hopeless 1 3 0  ?PHQ - 2 Score 2 4 0  ?Altered sleeping 0 2 1  ?Tired, decreased energy 0 3 0  ?Change in appetite 1 0 0  ?Feeling bad or failure about yourself  0 0 0  ?Trouble concentrating 0 0 0  ?Moving slowly or fidgety/restless 0 0 0  ?Suicidal thoughts 0 0 0  ?PHQ-9 Score '3 9 1  '$ ?Difficult doing work/chores Not difficult at all Somewhat difficult Not difficult at all  ?Some encounter information is confidential and restricted. Go to Review Flowsheets activity to see all data.  ?Some recent data might be hidden  ? ? ?Social History  ? ?Tobacco Use  ? Smoking status: Former  ?  Packs/day: 0.25  ?  Years: 10.00  ?  Pack years: 2.50  ?  Types: Cigarettes  ?  Quit date:  11/29/1995  ?  Years since quitting: 25.3  ? Smokeless tobacco: Former  ? Tobacco comments:  ?  Quite 1997, didnt smoke hardly at all when I was smoking.  ?Vaping Use  ? Vaping Use: Never used  ?Substance Use Topics  ? Alcohol use: No  ? Drug use: No  ? ? ?Review of Systems ?Per HPI unless specifically indicated above ? ?   ?Objective:  ?  ?BP 124/63   Pulse 76   Ht '5\' 2"'$  (1.575 m)   Wt 209 lb 6.4 oz (95 kg)   SpO2 96%   BMI 38.30 kg/m?   ?Wt Readings from Last 3 Encounters:  ?03/14/21 209 lb 6.4 oz (95 kg)  ?03/11/21 189 lb (85.7 kg)  ?03/06/21 208 lb (94.3 kg)  ?  ?Physical Exam ?Vitals and nursing note reviewed.  ?Constitutional:   ?   General: She is not in acute distress. ?   Appearance: Normal appearance. She is well-developed. She is not diaphoretic.  ?   Comments: Well-appearing, comfortable, cooperative  ?HENT:  ?   Head: Normocephalic and atraumatic.  ?   Right Ear: There is no impacted cerumen.  ?   Left Ear: There is no impacted cerumen.  ?   Ears:  ?   Comments: Left ear TM with some mild effusion ?Eyes:  ?   General:     ?  Right eye: No discharge.     ?   Left eye: No discharge.  ?   Conjunctiva/sclera: Conjunctivae normal.  ?Cardiovascular:  ?   Rate and Rhythm: Normal rate.  ?Pulmonary:  ?   Effort: Pulmonary effort is normal.  ?Skin: ?   General: Skin is warm and dry.  ?   Findings: No erythema or rash.  ?Neurological:  ?   Mental Status: She is alert and oriented to person, place, and time.  ?Psychiatric:     ?   Mood and Affect: Mood normal.     ?   Behavior: Behavior normal.     ?   Thought Content: Thought content normal.  ?   Comments: Well groomed, good eye contact, normal speech and thoughts  ? ? ?I have personally reviewed the radiology report from 03/11/21 CXR. ? ?DG Chest 2 ViewPerformed 03/11/2021 ?Final result  ?Study Result ?CLINICAL DATA: Chest pain and shortness of breath. ? ?EXAM: ?CHEST - 2 VIEW ? ?COMPARISON: None. ? ?FINDINGS: ?Low lung volumes are noted. Mild, diffuse, chronic  appearing ?increased lung markings are seen. There is no evidence of focal ?consolidation, pleural effusion or pneumothorax. The heart size and ?mediastinal contours are within normal limits. Radiopaque surgical ?clips are seen within the right upper quadrant. There is a chronic ?deformity of the distal left clavicle. A radiopaque fusion plate and ?screws are seen overlying the cervical spine. Multilevel ?degenerative changes are noted throughout the thoracic spine. ? ?IMPRESSION: ?Chronic appearing increased lung markings without evidence of acute ?or active cardiopulmonary disease. ? ? ?Electronically Signed ?By: Virgina Norfolk M.D. ?On: 03/11/2021 20:38 ? ?I have personally reviewed the radiology report from CT Head on 03/11/21. ? ?CT HEAD WO CONTRAST (5MM)Performed 03/11/2021 ?Final result  ?Study Result ?CLINICAL DATA: Headache ? ?EXAM: ?CT HEAD WITHOUT CONTRAST ? ?TECHNIQUE: ?Contiguous axial images were obtained from the base of the skull ?through the vertex without intravenous contrast. ? ?RADIATION DOSE REDUCTION: This exam was performed according to the ?departmental dose-optimization program which includes automated ?exposure control, adjustment of the mA and/or kV according to ?patient size and/or use of iterative reconstruction technique. ? ?COMPARISON: None. ? ?FINDINGS: ?Brain: No evidence of acute infarction, hemorrhage, hydrocephalus, ?extra-axial collection or mass lesion/mass effect. ? ?Vascular: No hyperdense vessel or unexpected calcification. ? ?Skull: Normal. Negative for fracture or focal lesion. ? ?Sinuses/Orbits: No acute finding. ? ?Other: None. ? ?IMPRESSION: ?No acute intracranial pathology. No noncontrast CT findings to ?explain headache. ? ? ?Electronically Signed ?By: Delanna Ahmadi M.D. ?On: 03/11/2021 21:35 ? ? ? ? ? ?Results for orders placed or performed during the hospital encounter of 03/11/21  ?Basic metabolic panel  ?Result Value Ref Range  ? Sodium 138 135 - 145 mmol/L  ?  Potassium 4.0 3.5 - 5.1 mmol/L  ? Chloride 106 98 - 111 mmol/L  ? CO2 25 22 - 32 mmol/L  ? Glucose, Bld 108 (H) 70 - 99 mg/dL  ? BUN 18 8 - 23 mg/dL  ? Creatinine, Ser 0.92 0.44 - 1.00 mg/dL  ? Calcium 8.9 8.9 - 10.3 mg/dL  ? GFR, Estimated >60 >60 mL/min  ? Anion gap 7 5 - 15  ?CBC  ?Result Value Ref Range  ? WBC 5.6 4.0 - 10.5 K/uL  ? RBC 4.52 3.87 - 5.11 MIL/uL  ? Hemoglobin 13.8 12.0 - 15.0 g/dL  ? HCT 42.6 36.0 - 46.0 %  ? MCV 94.2 80.0 - 100.0 fL  ? MCH 30.5 26.0 - 34.0 pg  ?  MCHC 32.4 30.0 - 36.0 g/dL  ? RDW 12.7 11.5 - 15.5 %  ? Platelets 177 150 - 400 K/uL  ? nRBC 0.0 0.0 - 0.2 %  ?Troponin I (High Sensitivity)  ?Result Value Ref Range  ? Troponin I (High Sensitivity) <2 <18 ng/L  ?Troponin I (High Sensitivity)  ?Result Value Ref Range  ? Troponin I (High Sensitivity) <2 <18 ng/L  ? ?   ?Assessment & Plan:  ? ?Problem List Items Addressed This Visit   ? ? Chronic migraine without aura with status migrainosus, not intractable - Primary  ? Relevant Medications  ? rizatriptan (MAXALT-MLT) 10 MG disintegrating tablet  ? ?Other Visit Diagnoses   ? ? Labile blood pressure      ? ?  ?  ?ED follow up/ Migraine elevated BP ?Improved s/p migraine IV cocktail in ED ?Start Rizatriptan ODT '10mg'$  PRN for migraine, see instructions for dosing. Trial on triptan for abortive therapy in future consider Nurtec ODT next option. ? ?Improved sinusitis ?Still has L ear pressure, eustachian tube dysfunction with fluid ?Has nasal steroid at home ? ?Meds ordered this encounter  ?Medications  ? rizatriptan (MAXALT-MLT) 10 MG disintegrating tablet  ?  Sig: Take 1 tablet (10 mg total) by mouth as needed for migraine. May repeat in 2 hours if needed, that is max dose for 24 hours.  ?  Dispense:  10 tablet  ?  Refill:  3  ? ? ? ? ?Follow up plan: ?Return if symptoms worsen or fail to improve. ? ?Nobie Putnam, DO ?[facility] ?03/14/2021, 8:49 AM ?

## 2021-03-22 DIAGNOSIS — M533 Sacrococcygeal disorders, not elsewhere classified: Secondary | ICD-10-CM | POA: Diagnosis not present

## 2021-03-22 DIAGNOSIS — G894 Chronic pain syndrome: Secondary | ICD-10-CM | POA: Diagnosis not present

## 2021-03-22 DIAGNOSIS — M48061 Spinal stenosis, lumbar region without neurogenic claudication: Secondary | ICD-10-CM | POA: Diagnosis not present

## 2021-03-22 DIAGNOSIS — M545 Low back pain, unspecified: Secondary | ICD-10-CM | POA: Diagnosis not present

## 2021-03-22 DIAGNOSIS — Z79899 Other long term (current) drug therapy: Secondary | ICD-10-CM | POA: Diagnosis not present

## 2021-03-22 DIAGNOSIS — M542 Cervicalgia: Secondary | ICD-10-CM | POA: Diagnosis not present

## 2021-03-22 DIAGNOSIS — M479 Spondylosis, unspecified: Secondary | ICD-10-CM | POA: Diagnosis not present

## 2021-03-22 DIAGNOSIS — M25552 Pain in left hip: Secondary | ICD-10-CM | POA: Diagnosis not present

## 2021-03-22 DIAGNOSIS — M47896 Other spondylosis, lumbar region: Secondary | ICD-10-CM | POA: Diagnosis not present

## 2021-03-22 DIAGNOSIS — F419 Anxiety disorder, unspecified: Secondary | ICD-10-CM | POA: Insufficient documentation

## 2021-03-26 ENCOUNTER — Encounter: Payer: Self-pay | Admitting: Family Medicine

## 2021-04-04 ENCOUNTER — Ambulatory Visit: Payer: 59

## 2021-04-06 ENCOUNTER — Ambulatory Visit (INDEPENDENT_AMBULATORY_CARE_PROVIDER_SITE_OTHER): Payer: Medicare HMO

## 2021-04-06 VITALS — BP 116/71 | HR 74 | Ht 62.0 in

## 2021-04-06 DIAGNOSIS — Z0000 Encounter for general adult medical examination without abnormal findings: Secondary | ICD-10-CM | POA: Diagnosis not present

## 2021-04-06 NOTE — Patient Instructions (Signed)

## 2021-04-06 NOTE — Progress Notes (Signed)
?I connected with Ashley Fields today by telephone and verified that I am speaking with the correct person using two identifiers. ?Location patient: home ?Location provider: work ?Persons participating in the virtual visit: Ashley Fields, Ashley Fields, Ashley Fields ?  ?I discussed the limitations, risks, security and privacy concerns of performing an evaluation and management service by telephone and the availability of in person appointments. I also discussed with the patient that there may be a patient responsible charge related to this service. The patient expressed understanding and verbally consented to this telephonic visit.  ? ?Subjective:  ? Ashley Fields is a 66 y.o. female who presents for Medicare Annual (Subsequent) preventive examination. ? ?Review of Systems    ?Per HPI unless specifically indicated above ? ? ?   ?Objective:  ?  ?Today's Vitals  ? 04/06/21 0947 04/06/21 0949  ?BP: 116/71   ?Pulse: 74   ?Height: '5\' 2"'$  (1.575 m)   ?PainSc:  4   ? ?Body mass index is 38.3 kg/m?. ? ? ?  03/11/2021  ?  8:03 PM 03/06/2021  ?  1:01 PM 09/05/2020  ?  1:00 PM 06/13/2020  ?  7:01 AM 03/29/2020  ? 11:05 AM 03/07/2020  ?  1:18 PM 08/22/2018  ? 11:04 AM  ?Advanced Directives  ?Does Patient Have a Medical Advance Directive? Yes Yes Yes No No No Yes  ?Type of Industrial/product designer of [place]  ?Does patient want to make changes to medical advance directive?   No - Patient declined    No - Patient declined  ?Would patient like information on creating a medical advance directive?    No - Patient declined  No - Patient declined No - Patient declined  ? ? ?Current Medications (verified) ?Outpatient Encounter Medications as of 04/06/2021  ?Medication Sig  ? ASPIRIN LOW DOSE 81 MG EC tablet TAKE 1 TABLET BY MOUTH EVERY DAY  ? butalbital-acetaminophen-caffeine (FIORICET) 50-325-40 MG tablet Take 1 tablet by mouth 2 (two) times daily as needed for headache.  ?  Cholecalciferol 250 MCG (10000 UT) CAPS Take by mouth.  ? cyanocobalamin (,VITAMIN B-12,) 1000 MCG/ML injection Inject 1,000 mcg into the muscle every 30 (thirty) days.  ? diclofenac Sodium (VOLTAREN) 1 % GEL Apply 2 g topically 4 (four) times daily.  ? estradiol (ESTRACE) 1 MG tablet TAKE 1 TABLET EVERY DAY  ? etodolac (LODINE) 500 MG tablet TAKE 1 TABLET BY MOUTH TWICE A DAY  ? ezetimibe (ZETIA) 10 MG tablet TAKE ONE TABLET BY MOUTH ONE TIME DAILY  ? fluticasone (FLONASE) 50 MCG/ACT nasal spray SPRAY 2 SPRAYS INTO EACH NOSTRIL EVERY DAY  ? furosemide (LASIX) 20 MG tablet TAKE 1 TABLET EVERY DAY FOR FLUID OR EDEMA AS NEEDED  ? gabapentin (NEURONTIN) 600 MG tablet Take 1 tablet (600 mg total) by mouth 3 (three) times daily.  ? hydrOXYzine (ATARAX/VISTARIL) 25 MG tablet TAKE 1/2 TO 1 TABLET BY MOUTH TWICE A DAY AS NEEDED FOR SEVERE PANIC ATTACKS/ ANXIETY  ? lamoTRIgine (LAMICTAL) 150 MG tablet Take 0.5 tablets (75 mg total) by mouth 2 (two) times daily.  ? mometasone (ELOCON) 0.1 % cream Apply 1 application topically 2 (two) times daily.  ? omeprazole (PRILOSEC) 40 MG capsule TAKE 1 CAPSULE BY MOUTH EVERY DAY  ? ondansetron (ZOFRAN ODT) 4 MG disintegrating tablet Take 1 tablet (4 mg total) by mouth every 8 (eight) hours as needed for nausea or vomiting.  ? oxyCODONE-acetaminophen (  PERCOCET/ROXICET) 5-325 MG tablet oxycodone-acetaminophen 5 mg-325 mg tablet ? Take 1 tablet every 6 hours by oral route for 30 days.  ? OZEMPIC, 1 MG/DOSE, 4 MG/3ML SOPN INJECT '1MG'$  INTO THE SKIN ONCE A WEEK  ? propranolol (INDERAL) 10 MG tablet TAKE 1 TABLET THREE TIMES DAILY AS NEEDED FOR SEVERE ANXIETY ATTACKS (LIMITED SUPPLY)  ? risperiDONE (RISPERDAL) 0.5 MG tablet TAKE 1 TABLET BY MOUTH AT BEDTIME.  ? rizatriptan (MAXALT-MLT) 10 MG disintegrating tablet Take 1 tablet (10 mg total) by mouth as needed for migraine. May repeat in 2 hours if needed, that is max dose for 24 hours.  ? rosuvastatin (CRESTOR) 40 MG tablet TAKE 1 TABLET EVERY  DAY  ? tiZANidine (ZANAFLEX) 4 MG tablet Take 4 mg by mouth 4 (four) times daily.  ? traZODone (DESYREL) 100 MG tablet TAKE 1 TABLET EVERY DAY AT BEDTIME  ? venlafaxine XR (EFFEXOR-XR) 75 MG 24 hr capsule TAKE ONE CAPSULE BY MOUTH DAILY WITH BREAKFAST  ? diazepam (VALIUM) 5 MG tablet SMARTSIG:1-2 Tablet(s) By Mouth  ? [DISCONTINUED] traMADol (ULTRAM) 50 MG tablet Take 50 mg by mouth 4 (four) times daily.  ? [DISCONTINUED] XIIDRA 5 % SOLN  (Patient not taking: Reported on 04/06/2021)  ? ?No facility-administered encounter medications on file as of 04/06/2021.  ? ? ?Allergies (verified) ?Flagyl [metronidazole], Cymbalta [duloxetine hcl], and Tape  ? ?History: ?Past Medical History:  ?Diagnosis Date  ? Allergy   ? Anemia   ? Anxiety   ? Arthritis   ? Yes  ? Blood transfusion without reported diagnosis 1977  ? Transfusions from miscarriage & hemorrhaging  ? Chronic kidney disease   ? Depression   ? Disp fx of cuboid bone of right foot, init for clos fx 01/01/2017  ? GERD (gastroesophageal reflux disease)   ? Headache   ? Hepatitis C   ? Hyperlipidemia   ? Neuromuscular disorder ([place]) 1997  ? Falling to much was an eye-opener  ? Osteoporosis   ? Thyroid disease   ? Urine incontinence   ? ?Past Surgical History:  ?Procedure Laterality Date  ? 5 miscarriages    ? [id number], Blood transfusion s/p miscarriage 1977  ? ABDOMINAL HYSTERECTOMY  1987  ? Total  ? APPENDECTOMY  12/2008  ? AUGMENTATION MAMMAPLASTY Bilateral 2015  ? Bilat  ? AUGMENTATION MAMMAPLASTY Bilateral 2018  ? implants redone w/ placement of implants under muscle  ? breast lift bilateral, implants  05/18/2015  ? bilateral, silicon naturel  ? BREAST REDUCTION SURGERY Bilateral 1997  ? CESAREAN SECTION  07/27/1981  ? Placenta Previa  ? CHOLECYSTECTOMY  03/2004  ? Lap surgery  ? COLONOSCOPY WITH PROPOFOL N/A 06/13/2020  ? Procedure: COLONOSCOPY WITH PROPOFOL;  Surgeon: Virgel Manifold, MD;  Location: [facility] ENDOSCOPY;  Service: Endoscopy;  Laterality: N/A;  ?  COSMETIC SURGERY    ? Breast implants w/ lift, tummy tuck, upper & lower Blepharop  ? ESOPHAGOGASTRODUODENOSCOPY (EGD) WITH PROPOFOL N/A 06/13/2020  ? Procedure: ESOPHAGOGASTRODUODENOSCOPY (EGD) WITH PROPOFOL;  Surgeon: Virgel Manifold, MD;  Location: [facility] ENDOSCOPY;  Service: Endoscopy;  Laterality: N/A;  ? EYE SURGERY  2017  ? Cataract surgery & lasik  ? GASTRIC BYPASS  2005  ? Laparoscopic  ? IVC FILTER INSERTION  12/2003  ? TrapEase Vena Cava Filter  ? LUMBAR LAMINECTOMY  06/2006  ? L4-L5 (spinal fusion)  ? mini tummy tuck  05/07/2016  ? Bilateral bra/back roll lift skin removal  ? neck surgery C3-C7  02/10/2015  ? ACDF  ?  REDUCTION MAMMAPLASTY Bilateral 1997  ? SHOULDER ACROMIOPLASTY Left 03/27/2013  ? w/ labral debridement  ? SMALL INTESTINE SURGERY  12/24/2003  ? Lap Gastric Bypass  ? SPINAL CORD STIMULATOR IMPLANT  11/2010  ? removed 05/2011  ? [place] SURGERY  2008 2017  ? Fusion L4-L5, ACDF C4-C7  ? ?Family History  ?Problem Relation Age of Onset  ? COPD Mother   ? Lung cancer Mother   ? Diabetes Mother   ? Heart disease Mother   ? Stroke Mother   ? Heart attack Mother   ? Arthritis Mother   ? Cancer Mother   ? Hypertension Mother   ? Obesity Mother   ? Varicose Veins Mother   ? Heart disease Father   ? Heart attack Father 30  ? Early death Father   ? Depression Sister   ?     x 5 sisters  ? COPD Sister   ? Hypertension Sister   ? Heart disease Brother   ?     x 3 brothers  ? Diabetes Brother   ? Colon polyps Sister   ? Hearing loss Sister   ? Heart attack Sister   ? Heart attack Brother   ? Arthritis Sister   ? Varicose Veins Sister   ? Arthritis Brother   ? Heart disease Brother   ? Hypertension Brother   ? Cancer Maternal Grandfather   ? Stroke Maternal Grandfather   ? COPD Sister   ? Obesity Sister   ? Depression Sister   ? Depression Sister   ? Heart disease Sister   ? Hypertension Sister   ? Diabetes Brother   ? Heart disease Brother   ? Hypertension Brother   ? Obesity Brother   ? Varicose Veins  Brother   ? Heart disease Brother   ? Hypertension Brother   ? Obesity Brother   ? Hypertension Sister   ? Obesity Sister   ? Miscarriages / Stillbirths Maternal Aunt   ? Miscarriages / Stillbirths Paternal Aunt   ? Bre

## 2021-04-07 ENCOUNTER — Ambulatory Visit: Payer: 59

## 2021-04-10 ENCOUNTER — Other Ambulatory Visit: Payer: Self-pay | Admitting: Psychiatry

## 2021-04-10 ENCOUNTER — Other Ambulatory Visit: Payer: Self-pay | Admitting: Cardiovascular Disease

## 2021-04-12 DIAGNOSIS — M8589 Other specified disorders of bone density and structure, multiple sites: Secondary | ICD-10-CM | POA: Diagnosis not present

## 2021-04-12 DIAGNOSIS — M818 Other osteoporosis without current pathological fracture: Secondary | ICD-10-CM | POA: Diagnosis not present

## 2021-04-14 ENCOUNTER — Other Ambulatory Visit: Payer: 59

## 2021-04-18 ENCOUNTER — Other Ambulatory Visit: Payer: Self-pay | Admitting: Psychiatry

## 2021-04-21 ENCOUNTER — Encounter: Payer: 59 | Admitting: Family Medicine

## 2021-04-27 ENCOUNTER — Other Ambulatory Visit: Payer: Self-pay | Admitting: Psychiatry

## 2021-04-27 DIAGNOSIS — F3176 Bipolar disorder, in full remission, most recent episode depressed: Secondary | ICD-10-CM

## 2021-04-27 DIAGNOSIS — F4310 Post-traumatic stress disorder, unspecified: Secondary | ICD-10-CM

## 2021-04-27 DIAGNOSIS — F410 Panic disorder [episodic paroxysmal anxiety] without agoraphobia: Secondary | ICD-10-CM

## 2021-04-28 ENCOUNTER — Other Ambulatory Visit: Payer: Medicare HMO

## 2021-04-28 ENCOUNTER — Other Ambulatory Visit: Payer: Self-pay

## 2021-04-28 DIAGNOSIS — R7309 Other abnormal glucose: Secondary | ICD-10-CM | POA: Diagnosis not present

## 2021-04-28 DIAGNOSIS — Z0000 Encounter for general adult medical examination without abnormal findings: Secondary | ICD-10-CM

## 2021-04-28 DIAGNOSIS — E538 Deficiency of other specified B group vitamins: Secondary | ICD-10-CM | POA: Diagnosis not present

## 2021-04-28 DIAGNOSIS — E782 Mixed hyperlipidemia: Secondary | ICD-10-CM

## 2021-04-28 DIAGNOSIS — Z1329 Encounter for screening for other suspected endocrine disorder: Secondary | ICD-10-CM

## 2021-04-28 DIAGNOSIS — D508 Other iron deficiency anemias: Secondary | ICD-10-CM | POA: Diagnosis not present

## 2021-04-28 DIAGNOSIS — E559 Vitamin D deficiency, unspecified: Secondary | ICD-10-CM

## 2021-04-29 LAB — COMPREHENSIVE METABOLIC PANEL
AG Ratio: 2 (calc) (ref 1.0–2.5)
ALT: 25 U/L (ref 6–29)
AST: 31 U/L (ref 10–35)
Albumin: 4.3 g/dL (ref 3.6–5.1)
Alkaline phosphatase (APISO): 46 U/L (ref 37–153)
BUN/Creatinine Ratio: 18 (calc) (ref 6–22)
BUN: 19 mg/dL (ref 7–25)
CO2: 30 mmol/L (ref 20–32)
Calcium: 9.1 mg/dL (ref 8.6–10.4)
Chloride: 104 mmol/L (ref 98–110)
Creat: 1.07 mg/dL — ABNORMAL HIGH (ref 0.50–1.05)
Globulin: 2.1 g/dL (calc) (ref 1.9–3.7)
Glucose, Bld: 82 mg/dL (ref 65–99)
Potassium: 4.3 mmol/L (ref 3.5–5.3)
Sodium: 142 mmol/L (ref 135–146)
Total Bilirubin: 0.8 mg/dL (ref 0.2–1.2)
Total Protein: 6.4 g/dL (ref 6.1–8.1)

## 2021-04-29 LAB — LIPID PANEL
Cholesterol: 132 mg/dL (ref <200)
HDL: 76 mg/dL (ref >=50)
LDL Cholesterol (Calc): 40 mg/dL (calc)
Non-HDL Cholesterol (Calc): 56 mg/dL (calc) (ref <130)
Total CHOL/HDL Ratio: 1.7 (calc) (ref <5.0)
Triglycerides: 79 mg/dL (ref <150)

## 2021-04-29 LAB — IRON,TIBC AND FERRITIN PANEL
%SAT: 18 % (calc) (ref 16–45)
Ferritin: 64 ng/mL (ref 16–288)
Iron: 66 ug/dL (ref 45–160)
TIBC: 365 mcg/dL (calc) (ref 250–450)

## 2021-04-29 LAB — VITAMIN B12: Vitamin B-12: 831 pg/mL (ref 200–1100)

## 2021-04-29 LAB — CBC WITH DIFFERENTIAL/PLATELET
Absolute Monocytes: 488 cells/uL (ref 200–950)
Basophils Absolute: 40 cells/uL (ref 0–200)
Basophils Relative: 0.9 %
Eosinophils Absolute: 238 cells/uL (ref 15–500)
Eosinophils Relative: 5.4 %
HCT: 41.2 % (ref 35.0–45.0)
Hemoglobin: 13.3 g/dL (ref 11.7–15.5)
Lymphs Abs: 1386 cells/uL (ref 850–3900)
MCH: 30.9 pg (ref 27.0–33.0)
MCHC: 32.3 g/dL (ref 32.0–36.0)
MCV: 95.6 fL (ref 80.0–100.0)
MPV: 9.5 fL (ref 7.5–12.5)
Monocytes Relative: 11.1 %
Neutro Abs: 2248 cells/uL (ref 1500–7800)
Neutrophils Relative %: 51.1 %
Platelets: 167 10*3/uL (ref 140–400)
RBC: 4.31 10*6/uL (ref 3.80–5.10)
RDW: 12.5 % (ref 11.0–15.0)
Total Lymphocyte: 31.5 %
WBC: 4.4 10*3/uL (ref 3.8–10.8)

## 2021-04-29 LAB — HEMOGLOBIN A1C
Hgb A1c MFr Bld: 4.8 % of total Hgb (ref <5.7)
Mean Plasma Glucose: 91 mg/dL
eAG (mmol/L): 5 mmol/L

## 2021-04-29 LAB — VITAMIN D 25 HYDROXY (VIT D DEFICIENCY, FRACTURES): Vit D, 25-Hydroxy: 106 ng/mL — ABNORMAL HIGH (ref 30–100)

## 2021-04-29 LAB — TSH: TSH: 3.29 mIU/L (ref 0.40–4.50)

## 2021-05-02 ENCOUNTER — Other Ambulatory Visit: Payer: Self-pay

## 2021-05-02 DIAGNOSIS — E782 Mixed hyperlipidemia: Secondary | ICD-10-CM

## 2021-05-02 DIAGNOSIS — G894 Chronic pain syndrome: Secondary | ICD-10-CM

## 2021-05-02 DIAGNOSIS — G8929 Other chronic pain: Secondary | ICD-10-CM

## 2021-05-02 MED ORDER — GABAPENTIN 600 MG PO TABS: 600.0000 mg | ORAL_TABLET | Freq: Three times a day (TID) | ORAL | 3 refills | Status: DC

## 2021-05-02 MED ORDER — EZETIMIBE 10 MG PO TABS: 10.0000 mg | ORAL_TABLET | Freq: Every day | ORAL | 1 refills | Status: DC

## 2021-05-03 DIAGNOSIS — M545 Low back pain, unspecified: Secondary | ICD-10-CM | POA: Diagnosis not present

## 2021-05-03 DIAGNOSIS — G894 Chronic pain syndrome: Secondary | ICD-10-CM | POA: Diagnosis not present

## 2021-05-03 DIAGNOSIS — M542 Cervicalgia: Secondary | ICD-10-CM | POA: Diagnosis not present

## 2021-05-03 DIAGNOSIS — M48061 Spinal stenosis, lumbar region without neurogenic claudication: Secondary | ICD-10-CM | POA: Diagnosis not present

## 2021-05-03 DIAGNOSIS — Z5181 Encounter for therapeutic drug level monitoring: Secondary | ICD-10-CM | POA: Diagnosis not present

## 2021-05-03 DIAGNOSIS — M479 Spondylosis, unspecified: Secondary | ICD-10-CM | POA: Diagnosis not present

## 2021-05-03 DIAGNOSIS — Z79891 Long term (current) use of opiate analgesic: Secondary | ICD-10-CM | POA: Diagnosis not present

## 2021-05-03 DIAGNOSIS — M533 Sacrococcygeal disorders, not elsewhere classified: Secondary | ICD-10-CM | POA: Diagnosis not present

## 2021-05-03 DIAGNOSIS — M25552 Pain in left hip: Secondary | ICD-10-CM | POA: Diagnosis not present

## 2021-05-03 DIAGNOSIS — M47896 Other spondylosis, lumbar region: Secondary | ICD-10-CM | POA: Diagnosis not present

## 2021-05-03 DIAGNOSIS — Z79899 Other long term (current) drug therapy: Secondary | ICD-10-CM | POA: Diagnosis not present

## 2021-05-04 ENCOUNTER — Encounter: Payer: Self-pay | Admitting: Internal Medicine

## 2021-05-04 ENCOUNTER — Encounter: Payer: Self-pay | Admitting: Cardiovascular Disease

## 2021-05-04 ENCOUNTER — Encounter: Payer: Self-pay | Admitting: Family Medicine

## 2021-05-04 DIAGNOSIS — M47816 Spondylosis without myelopathy or radiculopathy, lumbar region: Secondary | ICD-10-CM | POA: Diagnosis not present

## 2021-05-05 ENCOUNTER — Ambulatory Visit (INDEPENDENT_AMBULATORY_CARE_PROVIDER_SITE_OTHER): Payer: Medicare HMO | Admitting: Family Medicine

## 2021-05-05 ENCOUNTER — Encounter: Payer: Self-pay | Admitting: Family Medicine

## 2021-05-05 VITALS — BP 126/74 | HR 87 | Ht 62.0 in | Wt 205.6 lb

## 2021-05-05 DIAGNOSIS — I739 Peripheral vascular disease, unspecified: Secondary | ICD-10-CM | POA: Diagnosis not present

## 2021-05-05 DIAGNOSIS — M8580 Other specified disorders of bone density and structure, unspecified site: Secondary | ICD-10-CM

## 2021-05-05 DIAGNOSIS — F3176 Bipolar disorder, in full remission, most recent episode depressed: Secondary | ICD-10-CM | POA: Diagnosis not present

## 2021-05-05 DIAGNOSIS — G43701 Chronic migraine without aura, not intractable, with status migrainosus: Secondary | ICD-10-CM

## 2021-05-05 DIAGNOSIS — G894 Chronic pain syndrome: Secondary | ICD-10-CM

## 2021-05-05 DIAGNOSIS — Z0000 Encounter for general adult medical examination without abnormal findings: Secondary | ICD-10-CM | POA: Diagnosis not present

## 2021-05-05 DIAGNOSIS — L570 Actinic keratosis: Secondary | ICD-10-CM

## 2021-05-05 DIAGNOSIS — M5136 Other intervertebral disc degeneration, lumbar region: Secondary | ICD-10-CM | POA: Diagnosis not present

## 2021-05-05 DIAGNOSIS — I25118 Atherosclerotic heart disease of native coronary artery with other forms of angina pectoris: Secondary | ICD-10-CM | POA: Diagnosis not present

## 2021-05-05 DIAGNOSIS — F4310 Post-traumatic stress disorder, unspecified: Secondary | ICD-10-CM | POA: Diagnosis not present

## 2021-05-05 DIAGNOSIS — Z808 Family history of malignant neoplasm of other organs or systems: Secondary | ICD-10-CM

## 2021-05-05 NOTE — Assessment & Plan Note (Signed)
Followed by Cardiology On med management 

## 2021-05-05 NOTE — Assessment & Plan Note (Signed)
Stable chronic problem ?On Statin therapy + Zetia ?

## 2021-05-05 NOTE — Assessment & Plan Note (Signed)
Controlled ?Managed by Psychiatry ?On med management ?

## 2021-05-05 NOTE — Progress Notes (Signed)
? ?Subjective:  ? ? Patient ID: Ashley Fields, female    DOB: 10-10-1955, 66 y.o.   MRN: [id number] ? ?[name] is a 66 y.o. female presenting on 05/05/2021 for Annual Exam ? ? ?HPI ? ?Here for Annual Physical and Lab Review. ? ?Obesity BMI >37 ?A1c down to 4.8 ?Off Ozempic now. ? ?HYPERLIPIDEMIA: ?- Reports no concerns. Last lipid panel 04/2021, controlled  ?- Currently taking Zetia, Rosuvastatin '40mg'$ , tolerating well without side effects or myalgias ? ?Osteopenia ?Orthopedics will do the yearly injection ? ?Anemia ?Last lab shows improved anemia panel. Normal ferritin and iron reserves. ?Followed by Hematology w Iron Infusion every 6 months ? ?Upcoming spine surgery from [facility] Dr Christinia Gully R L5-S1 hemilaminectomy and cyst removal. ? ?thumb joint arthritis deformity ? ?AK ?Nose skin spot, red rough sensitive raised, 5 weeks when noticed it. ? ?Health Maintenance: ?  ?UTD PCV-20 Prevnar 06/2020 ? ?1st Shingrix dose recently 04/2021, next due 4-6 months ? ? ?  05/05/2021  ? 11:23 AM 03/14/2021  ?  8:40 AM 08/02/2020  ?  4:45 PM  ?Depression screen PHQ 2/9  ?Decreased Interest 2 1   ?Down, Depressed, Hopeless 1 1   ?PHQ - 2 Score 3 2   ?Altered sleeping 0 0   ?Tired, decreased energy 0 0   ?Change in appetite 1 1   ?Feeling bad or failure about yourself  0 0   ?Trouble concentrating 0 0   ?Moving slowly or fidgety/restless 0 0   ?Suicidal thoughts 0 0   ?PHQ-9 Score 4 3   ?Difficult doing work/chores Somewhat difficult Not difficult at all   ?  ? Information is confidential and restricted. Go to Review Flowsheets to unlock data.  ? ? ?Past Medical History:  ?Diagnosis Date  ? Allergy   ? Anemia   ? Anxiety   ? Arthritis   ? Yes  ? Blood transfusion without reported diagnosis 1977  ? Transfusions from miscarriage & hemorrhaging  ? Chronic kidney disease   ? Depression   ? Disp fx of cuboid bone of right foot, init for clos fx 01/01/2017  ? GERD (gastroesophageal reflux disease)   ? Headache   ? Hepatitis C   ?  Hyperlipidemia   ? Neuromuscular disorder ([place]) 1997  ? Falling to much was an eye-opener  ? Osteoporosis   ? Thyroid disease   ? Urine incontinence   ? ?Past Surgical History:  ?Procedure Laterality Date  ? 5 miscarriages    ? [id number], Blood transfusion s/p miscarriage 1977  ? ABDOMINAL HYSTERECTOMY  1987  ? Total  ? APPENDECTOMY  12/2008  ? AUGMENTATION MAMMAPLASTY Bilateral 2015  ? Bilat  ? AUGMENTATION MAMMAPLASTY Bilateral 2018  ? implants redone w/ placement of implants under muscle  ? breast lift bilateral, implants  05/18/2015  ? bilateral, silicon naturel  ? BREAST REDUCTION SURGERY Bilateral 1997  ? CESAREAN SECTION  07/27/1981  ? Placenta Previa  ? CHOLECYSTECTOMY  03/2004  ? Lap surgery  ? COLONOSCOPY WITH PROPOFOL N/A 06/13/2020  ? Procedure: COLONOSCOPY WITH PROPOFOL;  Surgeon: Virgel Manifold, MD;  Location: [facility] ENDOSCOPY;  Service: Endoscopy;  Laterality: N/A;  ? COSMETIC SURGERY    ? Breast implants w/ lift, tummy tuck, upper & lower Blepharop  ? ESOPHAGOGASTRODUODENOSCOPY (EGD) WITH PROPOFOL N/A 06/13/2020  ? Procedure: ESOPHAGOGASTRODUODENOSCOPY (EGD) WITH PROPOFOL;  Surgeon: Virgel Manifold, MD;  Location: [facility] ENDOSCOPY;  Service: Endoscopy;  Laterality: N/A;  ? EYE SURGERY  2017  ?  Cataract surgery & lasik  ? GASTRIC BYPASS  2005  ? Laparoscopic  ? IVC FILTER INSERTION  12/2003  ? TrapEase Vena Cava Filter  ? LUMBAR LAMINECTOMY  06/2006  ? L4-L5 (spinal fusion)  ? mini tummy tuck  05/07/2016  ? Bilateral bra/back roll lift skin removal  ? neck surgery C3-C7  02/10/2015  ? ACDF  ? REDUCTION MAMMAPLASTY Bilateral 1997  ? SHOULDER ACROMIOPLASTY Left 03/27/2013  ? w/ labral debridement  ? SMALL INTESTINE SURGERY  12/24/2003  ? Lap Gastric Bypass  ? SPINAL CORD STIMULATOR IMPLANT  11/2010  ? removed 05/2011  ? [place] SURGERY  2008 2017  ? Fusion L4-L5, ACDF C4-C7  ? ?Social History  ? ?Socioeconomic History  ? Marital status: Married  ?  Spouse name: Not on file  ? Number of children: 1  ?  Years of education: High School  ? Highest education level: Some college, no degree  ?Occupational History  ? Occupation: disability  ?Tobacco Use  ? Smoking status: Former  ?  Packs/day: 0.25  ?  Years: 10.00  ?  Pack years: 2.50  ?  Types: Cigarettes  ?  Quit date: 11/29/1995  ?  Years since quitting: 25.4  ? Smokeless tobacco: Former  ? Tobacco comments:  ?  Quite 1997, didnt smoke hardly at all when I was smoking.  ?Vaping Use  ? Vaping Use: Never used  ?Substance and Sexual Activity  ? Alcohol use: No  ? Drug use: No  ? Sexual activity: Yes  ?  Birth control/protection: Condom, Post-menopausal, Surgical  ?  Comment: Total Hysterectomy condoms  ?Other Topics Concern  ? Not on file  ?Social History Narrative  ? 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 ?Social Determinants of Health  ? ?Financial Resource Strain: Low Risk   ? Difficulty of Paying Living Expenses: Not very hard  ?Food Insecurity: No Food Insecurity  ? Worried About Charity fundraiser in the Last Year: Never true  ? Ran Out of Food in the Last Year: Never true  ?Transportation Needs: No Transportation Needs  ? Lack of Transportation (Medical): No  ? Lack of Transportation (Non-Medical): No  ?Physical Activity: Insufficiently Active  ? Days of Exercise per Week: 2 days  ? Minutes of Exercise per Session: 10 min  ?Stress: Stress Concern Present  ? Feeling of Stress : Very much  ?Social Connections: Moderately Isolated  ? Frequency of Communication with Friends and Family: More than three times a week  ? Frequency of Social Gatherings with Friends and Family: More than three times a week  ? Attends Religious Services: Never  ? Active Member of Clubs or Organizations: No  ? Attends Archivist Meetings: Never  ? Marital Status: Married  ?Intimate Partner Violence: Not At Risk  ? Fear of Current or Ex-Partner: No  ? Emotionally Abused: No  ? Physically Abused: No  ? Sexually  Abused: No  ? ?Family History  ?Problem Relation Age of Onset  ? COPD Mother   ? Lung cancer Mother   ? Diabetes Mother   ? Heart disease Mother   ? Stroke Mother   ? Heart attack Mother   ? Arthritis Mother   ? Cancer Mother   ? Hypertension Mother   ? Obesity Mother   ? Varicose Veins Mother   ? Heart disease Father   ? Heart attack Father 15  ? Early death Father   ?  Depression Sister   ?     x 5 sisters  ? COPD Sister   ? Hypertension Sister   ? Heart disease Brother   ?     x 3 brothers  ? Diabetes Brother   ? Colon polyps Sister   ? Hearing loss Sister   ? Heart attack Sister   ? Heart attack Brother   ? Arthritis Sister   ? Varicose Veins Sister   ? Arthritis Brother   ? Heart disease Brother   ? Hypertension Brother   ? Cancer Maternal Grandfather   ? Stroke Maternal Grandfather   ? COPD Sister   ? Obesity Sister   ? Depression Sister   ? Depression Sister   ? Heart disease Sister   ? Hypertension Sister   ? Diabetes Brother   ? Heart disease Brother   ? Hypertension Brother   ? Obesity Brother   ? Varicose Veins Brother   ? Heart disease Brother   ? Hypertension Brother   ? Obesity Brother   ? Hypertension Sister   ? Obesity Sister   ? Miscarriages / Stillbirths Maternal Aunt   ? Miscarriages / Stillbirths Paternal Aunt   ? Breast cancer Neg Hx   ? ?Current Outpatient Medications on File Prior to Visit  ?Medication Sig  ? ASPIRIN LOW DOSE 81 MG EC tablet TAKE 1 TABLET BY MOUTH EVERY DAY  ? butalbital-acetaminophen-caffeine (FIORICET) 50-325-40 MG tablet Take 1 tablet by mouth 2 (two) times daily as needed for headache.  ? Cholecalciferol 250 MCG (10000 UT) CAPS Take by mouth.  ? cyanocobalamin (,VITAMIN B-12,) 1000 MCG/ML injection Inject 1,000 mcg into the muscle every 30 (thirty) days.  ? diclofenac Sodium (VOLTAREN) 1 % GEL Apply 2 g topically 4 (four) times daily.  ? estradiol (ESTRACE) 1 MG tablet TAKE 1 TABLET EVERY DAY  ? etodolac (LODINE) 500 MG tablet TAKE 1 TABLET BY MOUTH TWICE A DAY  ? ezetimibe  (ZETIA) 10 MG tablet Take 1 tablet (10 mg total) by mouth daily.  ? fluticasone (FLONASE) 50 MCG/ACT nasal spray SPRAY 2 SPRAYS INTO EACH NOSTRIL EVERY DAY  ? furosemide (LASIX) 20 MG tablet TAKE 1 TABLET EVERY

## 2021-05-05 NOTE — Patient Instructions (Addendum)
Thank you for coming to the office today. ? ?Stay tuned for apt ? ?[facility] ?Dermatologist in [place], [place] ?Address: [address] [place], [place] [address] ?Phone: [phone] ? ?Good luck with upcoming surgery ? ?Please schedule a Follow-up Appointment to: Return in about 6 months (around 11/04/2021) for 6 month follow-up . ? ?If you have any other questions or concerns, please feel free to call the office or send a message through [place]. You may also schedule an earlier appointment if necessary. ? ?Additionally, you may be receiving a survey about your experience at our office within a few days to 1 week by e-mail or mail. We value your feedback. ? ?Nobie Putnam, DO ?[place] ?

## 2021-05-14 ENCOUNTER — Other Ambulatory Visit: Payer: Self-pay | Admitting: Psychiatry

## 2021-05-14 DIAGNOSIS — F4310 Post-traumatic stress disorder, unspecified: Secondary | ICD-10-CM

## 2021-05-16 ENCOUNTER — Encounter: Payer: Self-pay | Admitting: Psychiatry

## 2021-05-16 ENCOUNTER — Telehealth (INDEPENDENT_AMBULATORY_CARE_PROVIDER_SITE_OTHER): Payer: Medicare HMO | Admitting: Psychiatry

## 2021-05-16 DIAGNOSIS — F4310 Post-traumatic stress disorder, unspecified: Secondary | ICD-10-CM

## 2021-05-16 DIAGNOSIS — F3161 Bipolar disorder, current episode mixed, mild: Secondary | ICD-10-CM | POA: Diagnosis not present

## 2021-05-16 DIAGNOSIS — F410 Panic disorder [episodic paroxysmal anxiety] without agoraphobia: Secondary | ICD-10-CM

## 2021-05-16 DIAGNOSIS — G4701 Insomnia due to medical condition: Secondary | ICD-10-CM | POA: Diagnosis not present

## 2021-05-16 MED ORDER — VENLAFAXINE HCL ER 75 MG PO CP24: 75.0000 mg | ORAL_CAPSULE | Freq: Every day | ORAL | 1 refills | Status: DC

## 2021-05-16 MED ORDER — LAMOTRIGINE 25 MG PO TABS: 25.0000 mg | ORAL_TABLET | Freq: Every day | ORAL | 0 refills | Status: DC

## 2021-05-16 NOTE — Progress Notes (Signed)
Virtual Visit via Video Note ? ?I connected with Ashley Fields on 05/16/21 at  8:30 AM EDT by a video enabled telemedicine application and verified that I am speaking with the correct person using two identifiers. ? ?Location ?Provider Location : [facility] ?Patient Location : Home ? ?Participants: Patient , Provider ? ? ?  ?I discussed the limitations of evaluation and management by telemedicine and the availability of in person appointments. The patient expressed understanding and agreed to proceed. ?  ?I discussed the assessment and treatment plan with the patient. The patient was provided an opportunity to ask questions and all were answered. The patient agreed with the plan and demonstrated an understanding of the instructions. ?  ?The patient was advised to call back or seek an in-person evaluation if the symptoms worsen or if the condition fails to improve as anticipated. ? ? ? ?[place] MD OP Progress Note ? ?05/16/2021 8:57 AM ?Ashley Fields  ?MRN:  [id number] ? ?Chief Complaint:  ?Chief Complaint  ?Patient presents with  ? Follow-up: 66 year old Caucasian female with history of bipolar disorder, PTSD, panic disorder, insomnia, currently struggling with significant back pain awaiting back surgery approval was evaluated for medication management.  ? ?HPI: Ashley Fields is a 66 year old Caucasian female, married, on disability, lives in [place], has a history of bipolar disorder, PTSD, chronic pain, migraine headaches, history of laparoscopic gastric bypass was evaluated by telemedicine today. ? ?Patient today reports she is currently struggling with significant back pain, rates it at an 8 out of 10, 10 being the worst.  Patient reports she is unable to get up and walk and do any activities around the house since she has significant pain, tingling of her lower extremities when she tries to do that.  She currently takes a higher dosage of oxycodone and that also does not seem to help much.  Patient reports she is awaiting back  surgery however is awaiting insurance approval for the same. ? ?Patient reports the pain does have an impact on her mood, she does feel irritable and has mood swings.  This has been getting worse since the past 1 week or so. ? ?Patient reports sleep as overall okay as long as her dogs does not wake her up. ? ?Patient denies any suicidality, homicidality or perceptual disturbances. ? ?Patient is compliant on her medications.  Denies side effects. ? ?Denies any other concerns today. ? ?Visit Diagnosis:  ?  ICD-10-CM   ?1. Bipolar 1 disorder, mixed, mild (HCC)  F31.61 lamoTRIgine (LAMICTAL) 25 MG tablet  ?  ?2. PTSD (post-traumatic stress disorder)  F43.10 venlafaxine XR (EFFEXOR-XR) 75 MG 24 hr capsule  ?  ?3. Panic disorder  F41.0   ?  ?4. Insomnia due to medical condition  G47.01   ? mood  ?  ? ? ?Past Psychiatric History: Reviewed past psychiatric history from progress note on 05/15/2018.  Past trials of Prozac, Wellbutrin, Lexapro, Cymbalta, Rexulti, Abilify, Ambien-nightmares, Lunesta. ? ?Past Medical History:  ?Past Medical History:  ?Diagnosis Date  ? Allergy   ? Anemia   ? Anxiety   ? Arthritis   ? Yes  ? Blood transfusion without reported diagnosis 1977  ? Transfusions from miscarriage & hemorrhaging  ? Chronic kidney disease   ? Depression   ? Disp fx of cuboid bone of right foot, init for clos fx 01/01/2017  ? GERD (gastroesophageal reflux disease)   ? Headache   ? Hepatitis C   ? Hyperlipidemia   ? Neuromuscular disorder ([place])  1997  ? Falling to much was an eye-opener  ? Osteoporosis   ? Thyroid disease   ? Urine incontinence   ?  ?Past Surgical History:  ?Procedure Laterality Date  ? 5 miscarriages    ? [id number], Blood transfusion s/p miscarriage 1977  ? ABDOMINAL HYSTERECTOMY  1987  ? Total  ? APPENDECTOMY  12/2008  ? AUGMENTATION MAMMAPLASTY Bilateral 2015  ? Bilat  ? AUGMENTATION MAMMAPLASTY Bilateral 2018  ? implants redone w/ placement of implants under muscle  ? breast lift bilateral, implants   05/18/2015  ? bilateral, silicon naturel  ? BREAST REDUCTION SURGERY Bilateral 1997  ? CESAREAN SECTION  07/27/1981  ? Placenta Previa  ? CHOLECYSTECTOMY  03/2004  ? Lap surgery  ? COLONOSCOPY WITH PROPOFOL N/A 06/13/2020  ? Procedure: COLONOSCOPY WITH PROPOFOL;  Surgeon: Virgel Manifold, MD;  Location: [facility] ENDOSCOPY;  Service: Endoscopy;  Laterality: N/A;  ? COSMETIC SURGERY    ? Breast implants w/ lift, tummy tuck, upper & lower Blepharop  ? ESOPHAGOGASTRODUODENOSCOPY (EGD) WITH PROPOFOL N/A 06/13/2020  ? Procedure: ESOPHAGOGASTRODUODENOSCOPY (EGD) WITH PROPOFOL;  Surgeon: Virgel Manifold, MD;  Location: [facility] ENDOSCOPY;  Service: Endoscopy;  Laterality: N/A;  ? EYE SURGERY  2017  ? Cataract surgery & lasik  ? GASTRIC BYPASS  2005  ? Laparoscopic  ? IVC FILTER INSERTION  12/2003  ? TrapEase Vena Cava Filter  ? LUMBAR LAMINECTOMY  06/2006  ? L4-L5 (spinal fusion)  ? mini tummy tuck  05/07/2016  ? Bilateral bra/back roll lift skin removal  ? neck surgery C3-C7  02/10/2015  ? ACDF  ? REDUCTION MAMMAPLASTY Bilateral 1997  ? SHOULDER ACROMIOPLASTY Left 03/27/2013  ? w/ labral debridement  ? SMALL INTESTINE SURGERY  12/24/2003  ? Lap Gastric Bypass  ? SPINAL CORD STIMULATOR IMPLANT  11/2010  ? removed 05/2011  ? [place] SURGERY  2008 2017  ? Fusion L4-L5, ACDF C4-C7  ? ? ?Family Psychiatric History: Reviewed family psychiatric history from progress note on 05/15/2018. ? ?Family History:  ?Family History  ?Problem Relation Age of Onset  ? COPD Mother   ? Lung cancer Mother   ? Diabetes Mother   ? Heart disease Mother   ? Stroke Mother   ? Heart attack Mother   ? Arthritis Mother   ? Cancer Mother   ? Hypertension Mother   ? Obesity Mother   ? Varicose Veins Mother   ? Heart disease Father   ? Heart attack Father 8  ? Early death Father   ? Depression Sister   ?     x 5 sisters  ? COPD Sister   ? Hypertension Sister   ? Heart disease Brother   ?     x 3 brothers  ? Diabetes Brother   ? Colon polyps Sister   ? Hearing  loss Sister   ? Heart attack Sister   ? Heart attack Brother   ? Arthritis Sister   ? Varicose Veins Sister   ? Arthritis Brother   ? Heart disease Brother   ? Hypertension Brother   ? Cancer Maternal Grandfather   ? Stroke Maternal Grandfather   ? COPD Sister   ? Obesity Sister   ? Depression Sister   ? Depression Sister   ? Heart disease Sister   ? Hypertension Sister   ? Diabetes Brother   ? Heart disease Brother   ? Hypertension Brother   ? Obesity Brother   ? Varicose Veins Brother   ?  Heart disease Brother   ? Hypertension Brother   ? Obesity Brother   ? Hypertension Sister   ? Obesity Sister   ? Miscarriages / Stillbirths Maternal Aunt   ? Miscarriages / Stillbirths Paternal Aunt   ? Breast cancer Neg Hx   ? ? ?Social History: Reviewed social history from progress note on 05/15/2018. ?Social History  ? ?Socioeconomic History  ? Marital status: Married  ?  Spouse name: Not on file  ? Number of children: 1  ? Years of education: High School  ? Highest education level: Some college, no degree  ?Occupational History  ? Occupation: disability  ?Tobacco Use  ? Smoking status: Former  ?  Packs/day: 0.25  ?  Years: 10.00  ?  Pack years: 2.50  ?  Types: Cigarettes  ?  Quit date: 11/29/1995  ?  Years since quitting: 25.4  ? Smokeless tobacco: Former  ? Tobacco comments:  ?  Quite 1997, didnt smoke hardly at all when I was smoking.  ?Vaping Use  ? Vaping Use: Never used  ?Substance and Sexual Activity  ? Alcohol use: No  ? Drug use: No  ? Sexual activity: Yes  ?  Birth control/protection: Condom, Post-menopausal, Surgical  ?  Comment: Total Hysterectomy condoms  ?Other Topics Concern  ? Not on file  ?Social History Narrative  ? 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 ?Social Determinants of Health  ? ?Financial Resource Strain: Low Risk   ? Difficulty of Paying Living Expenses: Not very hard  ?Food Insecurity: No Food Insecurity  ? Worried About Paediatric nurse in the Last Year: Never true  ? Ran Out of Food in the Last Year: Never true  ?Transportation Needs: No Transportation Needs  ? Lack of Transportation (Medical): No  ? Lack of Transportation (Non-Me

## 2021-05-18 DIAGNOSIS — Z01818 Encounter for other preprocedural examination: Secondary | ICD-10-CM | POA: Diagnosis not present

## 2021-05-18 DIAGNOSIS — M5416 Radiculopathy, lumbar region: Secondary | ICD-10-CM | POA: Diagnosis not present

## 2021-05-19 ENCOUNTER — Other Ambulatory Visit: Payer: Self-pay | Admitting: Family Medicine

## 2021-05-19 DIAGNOSIS — K219 Gastro-esophageal reflux disease without esophagitis: Secondary | ICD-10-CM

## 2021-05-20 ENCOUNTER — Other Ambulatory Visit: Payer: Self-pay | Admitting: Psychiatry

## 2021-05-20 DIAGNOSIS — F3161 Bipolar disorder, current episode mixed, mild: Secondary | ICD-10-CM

## 2021-05-22 NOTE — Telephone Encounter (Signed)
last RF 02/28/21 #90 1 RF too soon ? ?Requested Prescriptions  ?Refused Prescriptions Disp Refills  ?? omeprazole (PRILOSEC) 40 MG capsule [Pharmacy Med Name: OMEPRAZOLE DR 40 MG CAPSULE] 90 capsule 1  ?  Sig: TAKE 1 CAPSULE BY MOUTH EVERY DAY  ?  ? Gastroenterology: Proton Pump Inhibitors Passed - 05/19/2021  4:00 PM  ?  ?  Passed - Valid encounter within last 12 months  ?  Recent Outpatient Visits   ?      ? 2 weeks ago Annual physical exam  ? [place], DO  ? 2 months ago Migraine without aura and with status migrainosus, not intractable  ? [place], DO  ? 2 months ago Acute non-recurrent frontal sinusitis  ? [place], DO  ? 5 months ago Acute right-sided low back pain with right-sided sciatica  ? [place], DO  ? 6 months ago Acute non-recurrent frontal sinusitis  ? [place], DO  ?  ?  ? ?  ?  ?  ? ? ?

## 2021-05-23 DIAGNOSIS — M7138 Other bursal cyst, other site: Secondary | ICD-10-CM | POA: Diagnosis not present

## 2021-05-23 DIAGNOSIS — F1120 Opioid dependence, uncomplicated: Secondary | ICD-10-CM | POA: Diagnosis not present

## 2021-05-23 DIAGNOSIS — M4807 Spinal stenosis, lumbosacral region: Secondary | ICD-10-CM | POA: Diagnosis not present

## 2021-05-23 DIAGNOSIS — Z7982 Long term (current) use of aspirin: Secondary | ICD-10-CM | POA: Diagnosis not present

## 2021-05-23 DIAGNOSIS — M48061 Spinal stenosis, lumbar region without neurogenic claudication: Secondary | ICD-10-CM | POA: Diagnosis not present

## 2021-05-23 DIAGNOSIS — M4727 Other spondylosis with radiculopathy, lumbosacral region: Secondary | ICD-10-CM | POA: Diagnosis not present

## 2021-05-23 DIAGNOSIS — G894 Chronic pain syndrome: Secondary | ICD-10-CM | POA: Diagnosis not present

## 2021-05-25 ENCOUNTER — Telehealth: Payer: Self-pay

## 2021-05-25 ENCOUNTER — Encounter: Payer: Self-pay | Admitting: Internal Medicine

## 2021-05-25 DIAGNOSIS — M47819 Spondylosis without myelopathy or radiculopathy, site unspecified: Secondary | ICD-10-CM | POA: Insufficient documentation

## 2021-05-25 DIAGNOSIS — M961 Postlaminectomy syndrome, not elsewhere classified: Secondary | ICD-10-CM | POA: Insufficient documentation

## 2021-05-25 NOTE — Telephone Encounter (Signed)
This patient is not on quetiapine.  Please verify refill request received and for what medication.  Please let me know.

## 2021-05-25 NOTE — Progress Notes (Signed)
Elevated vitamin D levels - I Sent a message to discontinue 50,000 units of vitamin D. Recommend 1000 units daily.  GB

## 2021-05-25 NOTE — Telephone Encounter (Signed)
received fax requesting a refill on the quetiapine

## 2021-05-29 NOTE — Telephone Encounter (Signed)
Pharmacy was notified about medication

## 2021-06-01 DIAGNOSIS — M25552 Pain in left hip: Secondary | ICD-10-CM | POA: Diagnosis not present

## 2021-06-01 DIAGNOSIS — M479 Spondylosis, unspecified: Secondary | ICD-10-CM | POA: Diagnosis not present

## 2021-06-01 DIAGNOSIS — M533 Sacrococcygeal disorders, not elsewhere classified: Secondary | ICD-10-CM | POA: Diagnosis not present

## 2021-06-01 DIAGNOSIS — M542 Cervicalgia: Secondary | ICD-10-CM | POA: Diagnosis not present

## 2021-06-01 DIAGNOSIS — G894 Chronic pain syndrome: Secondary | ICD-10-CM | POA: Diagnosis not present

## 2021-06-01 DIAGNOSIS — Z79899 Other long term (current) drug therapy: Secondary | ICD-10-CM | POA: Diagnosis not present

## 2021-06-01 DIAGNOSIS — M48061 Spinal stenosis, lumbar region without neurogenic claudication: Secondary | ICD-10-CM | POA: Diagnosis not present

## 2021-06-01 DIAGNOSIS — M47896 Other spondylosis, lumbar region: Secondary | ICD-10-CM | POA: Diagnosis not present

## 2021-06-01 DIAGNOSIS — M545 Low back pain, unspecified: Secondary | ICD-10-CM | POA: Diagnosis not present

## 2021-06-03 ENCOUNTER — Other Ambulatory Visit: Payer: Self-pay | Admitting: Psychiatry

## 2021-06-13 ENCOUNTER — Encounter: Payer: Self-pay | Admitting: Psychiatry

## 2021-06-13 ENCOUNTER — Telehealth (INDEPENDENT_AMBULATORY_CARE_PROVIDER_SITE_OTHER): Payer: Medicare HMO | Admitting: Psychiatry

## 2021-06-13 ENCOUNTER — Telehealth: Payer: Medicare HMO | Admitting: Psychiatry

## 2021-06-13 DIAGNOSIS — G4701 Insomnia due to medical condition: Secondary | ICD-10-CM | POA: Diagnosis not present

## 2021-06-13 DIAGNOSIS — F4310 Post-traumatic stress disorder, unspecified: Secondary | ICD-10-CM

## 2021-06-13 DIAGNOSIS — F410 Panic disorder [episodic paroxysmal anxiety] without agoraphobia: Secondary | ICD-10-CM

## 2021-06-13 DIAGNOSIS — F3161 Bipolar disorder, current episode mixed, mild: Secondary | ICD-10-CM | POA: Diagnosis not present

## 2021-06-13 MED ORDER — RISPERIDONE 1 MG PO TABS: 1.0000 mg | ORAL_TABLET | Freq: Every day | ORAL | 0 refills | Status: DC

## 2021-06-13 NOTE — Progress Notes (Signed)
Virtual Visit via Video Note  I connected with Ashley Fields on 06/13/21 at 10:00 AM EDT by a video enabled telemedicine application and verified that I am speaking with the correct person using two identifiers.  Location Provider Location : [facility] Patient Location : Home  Participants: Patient , Provider    I discussed the limitations of evaluation and management by telemedicine and the availability of in person appointments. The patient expressed understanding and agreed to proceed.   I discussed the assessment and treatment plan with the patient. The patient was provided an opportunity to ask questions and all were answered. The patient agreed with the plan and demonstrated an understanding of the instructions.   The patient was advised to call back or seek an in-person evaluation if the symptoms worsen or if the condition fails to improve as anticipated.  Jones MD OP Progress Note  06/13/2021 11:15 AM Rosali Augello  MRN:  [id number]  Chief Complaint:  Chief Complaint  Patient presents with   Follow-up: 66 year old Caucasian female with history of bipolar disorder, PTSD, panic disorder, insomnia, currently struggling with worsening mood symptoms after recent reduction of her pain medication-oxycodone.   HPI: Ashley Fields is a 66 year old Caucasian female, married, on disability, lives in [place], has a history of bipolar disorder, PTSD, chronic pain, migraine headaches, history of laparoscopic gastric bypass was evaluated by telemedicine today.  Patient today reports the last 1 week she has observed herself to be irritable, angry often, sad, lack of motivation, anhedonia, low energy, low frustration tolerance.  Reports sleep is also affected.  She reports she stays up until 11 PM watching TV and once she falls asleep, she sleeps only for to 5 to 6 hours.  She is currently on trazodone.  Patient currently compliant on her medications including risperidone, Lamictal, venlafaxine.  Denies side  effects.  Reports her oxycodone dosage was recently lowered from 7.5 mg to 5 mg.  She reports she is currently on other medications for pain management.  Does not believe pain is under control.  However since being on the lower dosage she has noticed an internal restlessness on and off.  She continues to follow-up with her providers.  Denies any suicidality, homicidality or perceptual disturbances.  Patient denies any other concerns today.  Visit Diagnosis:    ICD-10-CM   1. Bipolar 1 disorder, mixed, mild (HCC)  F31.61 risperiDONE (RISPERDAL) 1 MG tablet    2. PTSD (post-traumatic stress disorder)  F43.10     3. Panic disorder  F41.0     4. Insomnia due to medical condition  G47.01    mood      Past Psychiatric History: Reviewed past psychiatric history from progress note on 05/15/2018.  Past trials of Prozac, Wellbutrin, Lexapro, Cymbalta, Rexulti, Abilify, Ambien-nightmares, Lunesta.  Past Medical History:  Past Medical History:  Diagnosis Date   Allergy    Anemia    Anxiety    Arthritis    Yes   Blood transfusion without reported diagnosis 1977   Transfusions from miscarriage & hemorrhaging   Chronic kidney disease    Depression    Disp fx of cuboid bone of right foot, init for clos fx 01/01/2017   GERD (gastroesophageal reflux disease)    Headache    Hepatitis C    Hyperlipidemia    Neuromuscular disorder ([place]) 1997   Falling to much was an eye-opener   Osteoporosis    Thyroid disease    Urine incontinence     Past Surgical  History:  Procedure Laterality Date   5 miscarriages     1977-1985, Blood transfusion s/p miscarriage [place]   Total   APPENDECTOMY  12/2008   AUGMENTATION MAMMAPLASTY Bilateral 2015   Bilat   AUGMENTATION MAMMAPLASTY Bilateral 2018   implants redone w/ placement of implants under muscle   breast lift bilateral, implants  90/38/3338   bilateral, silicon naturel   BREAST REDUCTION SURGERY Bilateral 1997    CESAREAN SECTION  07/27/1981   Placenta Previa   CHOLECYSTECTOMY  03/2004   Lap surgery   COLONOSCOPY WITH PROPOFOL N/A 06/13/2020   Procedure: COLONOSCOPY WITH PROPOFOL;  Surgeon: Virgel Manifold, MD;  Location: [facility] ENDOSCOPY;  Service: Endoscopy;  Laterality: N/A;   COSMETIC SURGERY     Breast implants w/ lift, tummy tuck, upper & lower Blepharop   ESOPHAGOGASTRODUODENOSCOPY (EGD) WITH PROPOFOL N/A 06/13/2020   Procedure: ESOPHAGOGASTRODUODENOSCOPY (EGD) WITH PROPOFOL;  Surgeon: Virgel Manifold, MD;  Location: [facility] ENDOSCOPY;  Service: Endoscopy;  Laterality: N/A;   EYE SURGERY  2017   Cataract surgery & lasik   GASTRIC BYPASS  2005   Laparoscopic   IVC FILTER INSERTION  12/2003   TrapEase Vena Cava Filter   LUMBAR LAMINECTOMY  06/2006   L4-L5 (spinal fusion)   mini tummy tuck  05/07/2016   Bilateral bra/back roll lift skin removal   neck surgery C3-C7  02/10/2015   ACDF   REDUCTION MAMMAPLASTY Bilateral 1997   SHOULDER ACROMIOPLASTY Left 03/27/2013   w/ labral debridement   SMALL INTESTINE SURGERY  12/24/2003   Lap Gastric Bypass   SPINAL CORD STIMULATOR IMPLANT  11/2010   removed 05/2011   SPINE SURGERY  2008 2017   Fusion L4-L5, ACDF C4-C7    Family Psychiatric History: Reviewed family psychiatric history from progress note on 05/15/2018.  Family History:  Family History  Problem Relation Age of Onset   COPD Mother    Lung cancer Mother    Diabetes Mother    Heart disease Mother    Stroke Mother    Heart attack Mother    Arthritis Mother    Cancer Mother    Hypertension Mother    Obesity Mother    Varicose Veins Mother    Heart disease Father    Heart attack Father 75   Early death Father    Depression Sister        x 38 sisters   COPD Sister    Hypertension Sister    Heart disease Brother        x 3 brothers   Diabetes Brother    Colon polyps Sister    Hearing loss Sister    Heart attack Sister    Heart attack Brother    Arthritis Sister     Varicose Veins Sister    Arthritis Brother    Heart disease Brother    Hypertension Brother    Cancer Maternal Grandfather    Stroke Maternal Grandfather    COPD Sister    Obesity Sister    Depression Sister    Depression Sister    Heart disease Sister    Hypertension Sister    Diabetes Brother    Heart disease Brother    Hypertension Brother    Obesity Brother    Varicose Veins Brother    Heart disease Brother    Hypertension Brother    Obesity Brother    Hypertension Sister    Obesity Sister    Miscarriages /  Stillbirths Maternal Aunt    Miscarriages / Stillbirths Paternal Aunt    Breast cancer Neg Hx     Social History: Reviewed social history from progress note on 05/15/2018. Social History   Socioeconomic History   Marital status: Married    Spouse name: Not on file   Number of children: 1   Years of education: High School   Highest education level: Some college, no degree  Occupational History   Occupation: disability  Tobacco Use   Smoking status: Former    Packs/day: 0.25    Years: 10.00    Pack years: 2.50    Types: Cigarettes    Quit date: 11/29/1995    Years since quitting: 25.5   Smokeless tobacco: Former   Tobacco comments:    Quite 1997, didnt smoke hardly at all when I was smoking.  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]   Social Determinants of Radio broadcast assistant Strain: Low Risk    Difficulty of Paying Living Expenses: Not very hard  Food Insecurity: No Food Insecurity   Worried About Charity fundraiser in the Last Year: Never true   Arboriculturist in the Last Year: Never true   Transportation Needs: No Transportation Needs   Lack of Transportation (Medical): No   Lack of Transportation (Non-Medical): No  Physical Activity: Insufficiently Active   Days of Exercise per Week: 2 days   Minutes of Exercise per Session: 10 min  Stress: Stress Concern Present   Feeling of Stress : Very much  Social Connections: Moderately Isolated   Frequency of Communication with Friends and Family: More than three times a week   Frequency of Social Gatherings with Friends and Family: More than three times a week   Attends Religious Services: Never   Marine scientist or Organizations: No   Attends Archivist Meetings: Never   Marital Status: Married    Allergies:  Allergies  Allergen Reactions   Flagyl [Metronidazole] Anaphylaxis   Cymbalta [Duloxetine Hcl]    Tape Rash    Metabolic Disorder Labs: Lab Results  Component Value Date   HGBA1C 4.8 04/28/2021   MPG 91 04/28/2021   MPG 97 03/30/2020   No results found for: PROLACTIN Lab Results  Component Value Date   CHOL 132 04/28/2021   TRIG 79 04/28/2021   HDL 76 04/28/2021   CHOLHDL 1.7 04/28/2021   LDLCALC 40 04/28/2021   LDLCALC 49 03/30/2020   Lab Results  Component Value Date   TSH 3.29 04/28/2021   TSH 1.97 03/30/2020    Therapeutic Level Labs: No results found for: LITHIUM No results found for: VALPROATE No components found for:  CBMZ  Current Medications: Current Outpatient Medications  Medication Sig Dispense Refill   ASPIRIN LOW DOSE 81 MG EC tablet TAKE 1 TABLET BY MOUTH EVERY DAY 90 tablet 3   butalbital-acetaminophen-caffeine (FIORICET) 50-325-40 MG tablet Take 1 tablet by mouth 2 (two) times daily as needed for headache.     Cholecalciferol 250 MCG (10000 UT) CAPS Take by mouth.     cyanocobalamin (,VITAMIN B-12,)  1000 MCG/ML injection Inject 1,000 mcg into the muscle every 30 (thirty) days.     diclofenac Sodium (VOLTAREN) 1 % GEL Apply 2 g topically 4 (four) times daily.      estradiol (ESTRACE) 1 MG tablet TAKE 1 TABLET EVERY DAY 90 tablet 1   etodolac (LODINE) 500 MG tablet TAKE 1 TABLET BY MOUTH TWICE A DAY 180 tablet 1   ezetimibe (ZETIA) 10 MG tablet Take 1 tablet (10 mg total) by mouth daily. 90 tablet 1   fluticasone (FLONASE) 50 MCG/ACT nasal spray SPRAY 2 SPRAYS INTO EACH NOSTRIL EVERY DAY 48 mL 2   furosemide (LASIX) 20 MG tablet TAKE 1 TABLET EVERY DAY FOR FLUID OR EDEMA AS NEEDED 90 tablet 0   gabapentin (NEURONTIN) 600 MG tablet Take 1 tablet (600 mg total) by mouth 3 (three) times daily. 90 tablet 3   hydrOXYzine (ATARAX/VISTARIL) 25 MG tablet TAKE 1/2 TO 1 TABLET BY MOUTH TWICE A DAY AS NEEDED FOR SEVERE PANIC ATTACKS/ ANXIETY 180 tablet 1   lamoTRIgine (LAMICTAL) 150 MG tablet TAKE 1/2 TABLET TWICE DAILY 90 tablet 1   lamoTRIgine (LAMICTAL) 25 MG tablet Take 1 tablet (25 mg total) by mouth daily. Take along with 150 mg lamictal daily 90 tablet 0   mometasone (ELOCON) 0.1 % cream Apply 1 application topically 2 (two) times daily.     omeprazole (PRILOSEC) 40 MG capsule TAKE 1 CAPSULE BY MOUTH EVERY DAY 90 capsule 1   ondansetron (ZOFRAN ODT) 4 MG disintegrating tablet Take 1 tablet (4 mg total) by mouth every 8 (eight) hours as needed for nausea or vomiting. 30 tablet 2   oxyCODONE-acetaminophen (PERCOCET/ROXICET) 5-325 MG tablet oxycodone-acetaminophen 5 mg-325 mg tablet  Take 1 tablet every 6 hours by oral route for 30 days.     oxyCODONE-acetaminophen (PERCOCET/ROXICET) 5-325 MG tablet oxycodone-acetaminophen 5 mg-325 mg tablet  Take 1 tablet every 6 hours by oral route as needed for 28 days.     propranolol (INDERAL) 10 MG tablet TAKE 1 TABLET THREE TIMES DAILY AS NEEDED FOR SEVERE ANXIETY ATTACKS (LIMITED SUPPLY) 90 tablet 0   risperiDONE (RISPERDAL) 1 MG tablet Take 1 tablet (1 mg total) by mouth at bedtime. 30 tablet 0   rizatriptan (MAXALT-MLT) 10 MG disintegrating tablet Take 1 tablet (10 mg total) by mouth as needed for migraine. May repeat  in 2 hours if needed, that is max dose for 24 hours. 10 tablet 3   rosuvastatin (CRESTOR) 40 MG tablet TAKE 1 TABLET EVERY DAY 90 tablet 1   SHINGRIX injection      tiZANidine (ZANAFLEX) 4 MG tablet Take 4 mg by mouth 4 (four) times daily.     traZODone (DESYREL) 100 MG tablet TAKE ONE TABLET BY MOUTH AT BEDTIME FOR SLEEP 90 tablet 0   venlafaxine XR (EFFEXOR-XR) 75 MG 24 hr capsule Take 1 capsule (75 mg total) by mouth daily with breakfast. 90 capsule 1   Zoledronic Acid (ZOMETA) 4 MG/100ML IVPB Once per year from Orthopedic 100 mL    oxyCODONE-acetaminophen (PERCOCET) 10-325 MG tablet oxycodone-acetaminophen 10 mg-325 mg tablet  one tablet po q 4-6 hours as needed for ACUTE on CHRONIC PAIN (Patient not taking: Reported on 06/13/2021)     oxyCODONE-acetaminophen (PERCOCET) 10-325 MG tablet Take 0.5-1 tablets by mouth every 8 (eight) hours as needed. (Patient not taking: Reported on 06/13/2021)     oxyCODONE-acetaminophen (PERCOCET) 7.5-325 MG tablet Take 1 tablet by mouth every 6 (six) hours as needed. (Patient not taking: Reported on  06/13/2021)     oxyCODONE-acetaminophen (PERCOCET) 7.5-325 MG tablet oxycodone-acetaminophen 7.5 mg-325 mg tablet  Take 1 tablet every 6 hours by oral route as needed for 5 days. (Patient not taking: Reported on 06/13/2021)     No current facility-administered medications for this visit.     Musculoskeletal: Strength & Muscle Tone:  UTA Gait & Station:  Seated Patient leans: N/A  Psychiatric Specialty Exam: Review of Systems  Psychiatric/Behavioral:  Positive for dysphoric mood and sleep disturbance. The patient is nervous/anxious.   All other systems reviewed and are negative.  There were no vitals taken for this visit.There is no height or weight on file to calculate BMI.  General Appearance: Casual  Eye Contact:  Fair  Speech:  Clear and Coherent  Volume:  Normal  Mood:  Anxious, Depressed, and Irritable  Affect:  Congruent  Thought Process:  Goal Directed  and Descriptions of Associations: Intact  Orientation:  Full (Time, Place, and Person)  Thought Content: Logical   Suicidal Thoughts:  No  Homicidal Thoughts:  No  Memory:  Immediate;   Fair Recent;   Fair Remote;   Fair  Judgement:  Fair  Insight:  Fair  Psychomotor Activity:  Normal  Concentration:  Concentration: Fair and Attention Span: Fair  Recall:  [employer] of Knowledge: Fair  Language: Fair  Akathisia:  No  Handed:  Right  AIMS (if indicated): done  Assets:  Communication Skills Desire for Improvement Housing Intimacy Social Support  ADL's:  Intact  Cognition: WNL  Sleep:  Poor   Screenings: AIMS    Flowsheet Row Video Visit from 06/13/2021 in [facility] Video Visit from 05/16/2021 in [facility] Total Score 0 0      GAD-7    [place] Office Visit from 05/05/2021 in [facility] Office Visit from 03/14/2021 in [facility] Office Visit from 12/28/2019 in [facility] Office Visit from 03/17/2019 in [facility] Office Visit from 11/20/2018 in [facility]  Total GAD-7 Score '2 2 2 2 4      '$ PHQ2-9    Flowsheet Row Video Visit from 05/16/2021 in [place] Office Visit from 05/05/2021 in [facility] Office Visit from 03/14/2021 in [facility] Video Visit from 08/02/2020 in [place] Video Visit from 07/18/2020 in [facility]  PHQ-2 Total Score '6 3 2 1 6  '$ PHQ-9 Total Score '10 4 3 10 21      '$ Flowsheet Row Video Visit from 05/16/2021 in [place] from 03/11/2021 in [place] Video Visit from 08/02/2020 in [place] No Risk No Risk No Risk        Assessment and Plan: Lavette Yankovich is a  66 year old Caucasian female who has a history of PTSD, bipolar disorder, panic disorder, migraine headaches, chronic pain was evaluated by telemedicine today.  Patient is currently struggling with worsening mood symptoms, after tapering down of her oxycodone for pain management.  Discussed plan as noted below.  Plan Bipolar disorder mixed mild-unstable Increase risperidone to 1 mg p.o. nightly Lamotrigine 175 mg p.o. daily in divided dosage. Gabapentin 600 mg 3 times daily.  Prescribed by primary care provider. Patient will continue to benefit from sufficient pain management  PTSD-stable Venlafaxine 75 mg p.o. daily Hydroxyzine 25 mg p.o. daily as needed  Panic disorder-unstable Venlafaxine 75 mg p.o. daily  Insomnia-unstable Risperidone dosage has been readjusted Trazodone 50-100 mg p.o. nightly Patient advised to work on sleep hygiene techniques.  Current mood lability, internal restlessness likely also due to being tapered down from the oxycodone.  She will continue to need sufficient pain management.  Patient to monitor her symptoms closely.  Follow-up in clinic in 1 week or sooner if needed.   Consent: Patient/Guardian gives verbal consent for treatment and assignment of benefits for services provided during this visit. Patient/Guardian expressed understanding and agreed to proceed.   This note was generated in part or whole with voice recognition software. Voice recognition is usually quite accurate but there are transcription errors that can and very often do occur. I apologize for any typographical errors that were not detected and corrected.      Ursula Alert, MD 06/13/2021, 11:15 AM

## 2021-06-21 ENCOUNTER — Other Ambulatory Visit: Payer: Self-pay | Admitting: Psychiatry

## 2021-06-21 DIAGNOSIS — F3176 Bipolar disorder, in full remission, most recent episode depressed: Secondary | ICD-10-CM

## 2021-06-21 DIAGNOSIS — F410 Panic disorder [episodic paroxysmal anxiety] without agoraphobia: Secondary | ICD-10-CM

## 2021-06-21 DIAGNOSIS — F4310 Post-traumatic stress disorder, unspecified: Secondary | ICD-10-CM

## 2021-06-22 ENCOUNTER — Other Ambulatory Visit: Payer: Self-pay | Admitting: Psychiatry

## 2021-06-22 ENCOUNTER — Other Ambulatory Visit: Payer: Self-pay | Admitting: Cardiovascular Disease

## 2021-06-22 ENCOUNTER — Other Ambulatory Visit: Payer: Self-pay | Admitting: Family Medicine

## 2021-06-22 DIAGNOSIS — R609 Edema, unspecified: Secondary | ICD-10-CM

## 2021-06-22 DIAGNOSIS — Z90710 Acquired absence of both cervix and uterus: Secondary | ICD-10-CM

## 2021-06-22 DIAGNOSIS — F4310 Post-traumatic stress disorder, unspecified: Secondary | ICD-10-CM

## 2021-06-22 NOTE — Telephone Encounter (Signed)
Requested Prescriptions  Pending Prescriptions Disp Refills  . estradiol (ESTRACE) 1 MG tablet [Pharmacy Med Name: ESTRADIOL 1 MG Tablet] 90 tablet 1    Sig: TAKE 1 TABLET EVERY DAY     OB/GYN:  Estrogens Passed - 06/22/2021  3:29 AM      Passed - Mammogram is up-to-date per Health Maintenance      Passed - Last BP in normal range    BP Readings from Last 1 Encounters:  05/05/21 126/74         Passed - Valid encounter within last 12 months    Recent Outpatient Visits          1 month ago Annual physical exam   [place], DO   3 months ago Migraine without aura and with status migrainosus, not intractable   [place], DO   3 months ago Acute non-recurrent frontal sinusitis   [place], DO   6 months ago Acute right-sided low back pain with right-sided sciatica   [place], DO   8 months ago Acute non-recurrent frontal sinusitis   [place], [place]

## 2021-06-23 ENCOUNTER — Other Ambulatory Visit: Payer: Self-pay | Admitting: Psychiatry

## 2021-06-23 DIAGNOSIS — F4310 Post-traumatic stress disorder, unspecified: Secondary | ICD-10-CM

## 2021-06-23 DIAGNOSIS — F410 Panic disorder [episodic paroxysmal anxiety] without agoraphobia: Secondary | ICD-10-CM

## 2021-06-23 DIAGNOSIS — F3176 Bipolar disorder, in full remission, most recent episode depressed: Secondary | ICD-10-CM

## 2021-06-28 ENCOUNTER — Encounter: Payer: Self-pay | Admitting: Cardiovascular Disease

## 2021-06-28 ENCOUNTER — Encounter: Payer: Self-pay | Admitting: Family Medicine

## 2021-06-29 ENCOUNTER — Encounter: Payer: Self-pay | Admitting: Psychiatry

## 2021-06-29 ENCOUNTER — Telehealth (INDEPENDENT_AMBULATORY_CARE_PROVIDER_SITE_OTHER): Payer: Medicare HMO | Admitting: Psychiatry

## 2021-06-29 DIAGNOSIS — M533 Sacrococcygeal disorders, not elsewhere classified: Secondary | ICD-10-CM | POA: Diagnosis not present

## 2021-06-29 DIAGNOSIS — G4701 Insomnia due to medical condition: Secondary | ICD-10-CM | POA: Diagnosis not present

## 2021-06-29 DIAGNOSIS — F3161 Bipolar disorder, current episode mixed, mild: Secondary | ICD-10-CM | POA: Diagnosis not present

## 2021-06-29 DIAGNOSIS — M479 Spondylosis, unspecified: Secondary | ICD-10-CM | POA: Diagnosis not present

## 2021-06-29 DIAGNOSIS — F4310 Post-traumatic stress disorder, unspecified: Secondary | ICD-10-CM

## 2021-06-29 DIAGNOSIS — G894 Chronic pain syndrome: Secondary | ICD-10-CM | POA: Diagnosis not present

## 2021-06-29 DIAGNOSIS — M48061 Spinal stenosis, lumbar region without neurogenic claudication: Secondary | ICD-10-CM | POA: Diagnosis not present

## 2021-06-29 DIAGNOSIS — M545 Low back pain, unspecified: Secondary | ICD-10-CM | POA: Diagnosis not present

## 2021-06-29 DIAGNOSIS — Z79899 Other long term (current) drug therapy: Secondary | ICD-10-CM | POA: Diagnosis not present

## 2021-06-29 DIAGNOSIS — F410 Panic disorder [episodic paroxysmal anxiety] without agoraphobia: Secondary | ICD-10-CM | POA: Diagnosis not present

## 2021-06-29 DIAGNOSIS — M47896 Other spondylosis, lumbar region: Secondary | ICD-10-CM | POA: Diagnosis not present

## 2021-06-29 DIAGNOSIS — M25552 Pain in left hip: Secondary | ICD-10-CM | POA: Diagnosis not present

## 2021-06-29 DIAGNOSIS — M5416 Radiculopathy, lumbar region: Secondary | ICD-10-CM | POA: Diagnosis not present

## 2021-06-29 MED ORDER — LAMOTRIGINE 100 MG PO TABS: 100.0000 mg | ORAL_TABLET | Freq: Two times a day (BID) | ORAL | 0 refills | Status: DC

## 2021-06-29 MED ORDER — RISPERIDONE 1 MG PO TABS: 1.0000 mg | ORAL_TABLET | Freq: Every day | ORAL | 1 refills | Status: DC

## 2021-06-29 NOTE — Progress Notes (Signed)
Virtual Visit via Video Note  I connected with Ashley Fields on 06/29/21 at  4:20 PM EDT by a video enabled telemedicine application and verified that I am speaking with the correct person using two identifiers.  Location Provider Location : [facility] Patient Location : Home  Participants: Patient , Provider    I discussed the limitations of evaluation and management by telemedicine and the availability of in person appointments. The patient expressed understanding and agreed to proceed.   I discussed the assessment and treatment plan with the patient. The patient was provided an opportunity to ask questions and all were answered. The patient agreed with the plan and demonstrated an understanding of the instructions.   The patient was advised to call back or seek an in-person evaluation if the symptoms worsen or if the condition fails to improve as anticipated.   BH MD OP Progress Note  06/29/2021 6:19 PM Ashley Fields  MRN:  [id number]  Chief Complaint:  Chief Complaint  Patient presents with   Follow-up: 66 year old Caucasian female with history of bipolar disorder, PTSD, panic attacks, insomnia, presented for medication management.   HPI: Ashley Fields is a 66 year old Caucasian female, married, on disability, lives in [place], has a history of bipolar disorder, PTSD, chronic pain, migraine headaches, history of laparoscopic gastric bypass was evaluated by telemedicine today.  Patient today reports since the past few weeks she has been worsening with regards to her mood, feels irritable often, struggles with lack of motivation, anhedonia.  She also reports she continues to be anxious often.  Patient reports she is in a lot of pain, current medications are not beneficial.  Patient reports her provider increased her oxycodone back to 7.5 mg which she was previously on and that also does not seem to help.  Her gabapentin dosage was increased yesterday.   Reports sleep is good on the current  combination of medication.  Denies side effects to risperidone, Lamictal or venlafaxine.  Patient denies any suicidality, homicidality or perceptual disturbances.  Patient denies any other concerns today.   Visit Diagnosis:    ICD-10-CM   1. Bipolar 1 disorder, mixed, mild (HCC)  F31.61 lamoTRIgine (LAMICTAL) 100 MG tablet    risperiDONE (RISPERDAL) 1 MG tablet    2. PTSD (post-traumatic stress disorder)  F43.10 lamoTRIgine (LAMICTAL) 100 MG tablet    3. Panic disorder  F41.0 lamoTRIgine (LAMICTAL) 100 MG tablet    4. Insomnia due to medical condition  G47.01    pain, mood      Past Psychiatric History: Reviewed past psychiatric history from progress note on 05/15/2018.  Past trials of Prozac, Wellbutrin, Lexapro, Cymbalta, Rexulti, Ambien-nightmares, Lunesta.  Past Medical History:  Past Medical History:  Diagnosis Date   Allergy    Anemia    Anxiety    Arthritis    Yes   Blood transfusion without reported diagnosis 1977   Transfusions from miscarriage & hemorrhaging   Chronic kidney disease    Depression    Disp fx of cuboid bone of right foot, init for clos fx 01/01/2017   GERD (gastroesophageal reflux disease)    Headache    Hepatitis C    Hyperlipidemia    Neuromuscular disorder (HCC) 1997   Falling to much was an eye-opener   Osteoporosis    Thyroid disease    Urine incontinence     Past Surgical History:  Procedure Laterality Date   5 miscarriages     1977-1985, Blood transfusion s/p miscarriage 1977   ABDOMINAL  HYSTERECTOMY  1987   Total   APPENDECTOMY  12/2008   AUGMENTATION MAMMAPLASTY Bilateral 2015   Bilat   AUGMENTATION MAMMAPLASTY Bilateral 2018   implants redone w/ placement of implants under muscle   breast lift bilateral, implants  05/18/2015   bilateral, silicon naturel   BREAST REDUCTION SURGERY Bilateral 1997   CESAREAN SECTION  07/27/1981   Placenta Previa   CHOLECYSTECTOMY  03/2004   Lap surgery   COLONOSCOPY WITH PROPOFOL N/A  06/13/2020   Procedure: COLONOSCOPY WITH PROPOFOL;  Surgeon: Pasty Spillers, MD;  Location: [facility] END[facility]COPY;  Service: Endoscopy;  Laterality: N/A;   COSMETIC SURGERY     Breast implants w/ lift, tummy tuck, upper & lower Blepharop   ESOPHAGOGASTRODUODENOSCOPY (EGD) WITH PROPOFOL N/A 06/13/2020   Procedure: ESOPHAGOGASTRODUODENOSCOPY (EGD) WITH PROPOFOL;  Surgeon: Pasty Spillers, MD;  Location: [facility] ENDOSCOPY;  Service: Endoscopy;  Laterality: N/A;   EYE SURGERY  2017   Cataract surgery & lasik   GASTRIC BYPASS  2005   Laparoscopic   IVC FILTER INSERTION  12/2003   TrapEase Vena Cava Filter   LUMBAR LAMINECTOMY  06/2006   L4-L5 (spinal fusion)   mini tummy tuck  05/07/2016   Bilateral bra/back roll lift skin removal   neck surgery C3-C7  02/10/2015   ACDF   REDUCTION MAMMAPLASTY Bilateral 1997   SHOULDER ACROMIOPLASTY Left 03/27/2013   w/ labral debridement   SMALL INTESTINE SURGERY  12/24/2003   Lap Gastric Bypass   SPINAL CORD STIMULATOR IMPLANT  11/2010   removed 05/2011   SPINE SURGERY  2008 2017   Fusion L4-L5, ACDF C4-C7    Family Psychiatric History: Reviewed family psychiatric history from progress note on 05/15/2018.  Family History:  Family History  Problem Relation Age of Onset   COPD Mother    Lung cancer Mother    Diabetes Mother    Heart disease Mother    Stroke Mother    Heart attack Mother    Arthritis Mother    Cancer Mother    Hypertension Mother    Obesity Mother    Varicose Veins Mother    Heart disease Father    Heart attack Father 28   Early death Father    Depression Sister        x 5 sisters   COPD Sister    Hypertension Sister    Heart disease Brother        x 3 brothers   Diabetes Brother    Colon polyps Sister    Hearing loss Sister    Heart attack Sister    Heart attack Brother    Arthritis Sister    Varicose Veins Sister    Arthritis Brother    Heart disease Brother    Hypertension Brother    Cancer Maternal Grandfather     Stroke Maternal Grandfather    COPD Sister    Obesity Sister    Depression Sister    Depression Sister    Heart disease Sister    Hypertension Sister    Diabetes Brother    Heart disease Brother    Hypertension Brother    Obesity Brother    Varicose Veins Brother    Heart disease Brother    Hypertension Brother    Obesity Brother    Hypertension Sister    Obesity Sister    Miscarriages / Stillbirths Maternal Aunt    Miscarriages / Stillbirths Paternal Aunt    Breast cancer Neg Hx  Social History: Reviewed social history from progress note on 05/15/2018. Social History   Socioeconomic History   Marital status: Married    Spouse name: Not on file   Number of children: 1   Years of education: High School   Highest education level: Some college, no degree  Occupational History   Occupation: disability  Tobacco Use   Smoking status: Former    Packs/day: 0.25    Years: 10.00    Total pack years: 2.50    Types: Cigarettes    Quit date: 11/29/1995    Years since quitting: 25.6   Smokeless tobacco: Former   Tobacco comments:    Quite 1997, didnt smoke hardly at all when I was smoking.  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]   Social Determinants of Health   Financial Resource Strain: Low Risk  (04/06/2021)   Overall Financial Resource Strain (CARDIA)    Difficulty of Paying Living Expenses: Not very hard  Food Insecurity: No Food Insecurity (04/06/2021)   Hunger Vital Sign    Worried About Running Out of Food in the Last Year: Never true    Ran Out of Food in the Last Year: Never true  Transportation Needs: No  Transportation Needs (04/06/2021)   PRAPARE - Administrator, Civil Service (Medical): No    Lack of Transportation (Non-Medical): No  Physical Activity: Insufficiently Active (04/06/2021)   Exercise Vital Sign    Days of Exercise per Week: 2 days    Minutes of Exercise per Session: 10 min  Stress: Stress Concern Present (04/06/2021)   [employer] of Occupational Health - Occupational Stress Questionnaire    Feeling of Stress : Very much  Social Connections: Moderately Isolated (04/06/2021)   Social Connection and Isolation Panel [NHANES]    Frequency of Communication with Friends and Family: More than three times a week    Frequency of Social Gatherings with Friends and Family: More than three times a week    Attends Religious Services: Never    Database administrator or Organizations: No    Attends Banker Meetings: Never    Marital Status: Married    Allergies:  Allergies  Allergen Reactions   Flagyl [Metronidazole] Anaphylaxis   Cymbalta [Duloxetine Hcl]    Tape Rash    Metabolic Disorder Labs: Lab Results  Component Value Date   HGBA1C 4.8 04/28/2021   MPG 91 04/28/2021   MPG 97 03/30/2020   No results found for: "PROLACTIN" Lab Results  Component Value Date   CHOL 132 04/28/2021   TRIG 79 04/28/2021   HDL 76 04/28/2021   CHOLHDL 1.7 04/28/2021   LDLCALC 40 04/28/2021   LDLCALC 49 03/30/2020   Lab Results  Component Value Date   TSH 3.29 04/28/2021   TSH 1.97 03/30/2020    Therapeutic Level Labs: No results found for: "LITHIUM" No results found for: "VALPROATE" No results found for: "CBMZ"  Current Medications: Current Outpatient Medications  Medication Sig Dispense Refill   ASPIRIN LOW DOSE 81 MG EC tablet TAKE 1 TABLET BY MOUTH EVERY DAY 90 tablet 3  butalbital-acetaminophen-caffeine (FIORICET) 50-325-40 MG tablet Take 1 tablet by mouth 2 (two) times daily as needed for headache.     Cholecalciferol 250 MCG (10000 UT)  CAPS Take by mouth.     cyanocobalamin (,VITAMIN B-12,) 1000 MCG/ML injection Inject 1,000 mcg into the muscle every 30 (thirty) days.     diclofenac Sodium (VOLTAREN) 1 % GEL Apply 2 g topically 4 (four) times daily.     estradiol (ESTRACE) 1 MG tablet TAKE 1 TABLET EVERY DAY 90 tablet 1   etodolac (LODINE) 500 MG tablet TAKE 1 TABLET BY MOUTH TWICE A DAY 180 tablet 1   ezetimibe (ZETIA) 10 MG tablet Take 1 tablet (10 mg total) by mouth daily. 90 tablet 1   fluticasone (FLONASE) 50 MCG/ACT nasal spray SPRAY 2 SPRAYS INTO EACH NOSTRIL EVERY DAY 48 mL 2   furosemide (LASIX) 20 MG tablet Take 1 tablet (20 mg total) by mouth daily as needed for fluid or edema. 90 tablet 1   gabapentin (NEURONTIN) 600 MG tablet Take 1 tablet (600 mg total) by mouth 3 (three) times daily. (Patient taking differently: Take 600 mg by mouth in the morning, at noon, in the evening, and at bedtime.) 90 tablet 3   hydrOXYzine (ATARAX/VISTARIL) 25 MG tablet TAKE 1/2 TO 1 TABLET BY MOUTH TWICE A DAY AS NEEDED FOR SEVERE PANIC ATTACKS/ ANXIETY 180 tablet 1   lamoTRIgine (LAMICTAL) 100 MG tablet Take 1 tablet (100 mg total) by mouth 2 (two) times daily. 180 tablet 0   mometasone (ELOCON) 0.1 % cream Apply 1 application topically 2 (two) times daily.     omeprazole (PRILOSEC) 40 MG capsule TAKE 1 CAPSULE BY MOUTH EVERY DAY 90 capsule 1   ondansetron (ZOFRAN ODT) 4 MG disintegrating tablet Take 1 tablet (4 mg total) by mouth every 8 (eight) hours as needed for nausea or vomiting. 30 tablet 2   oxyCODONE-acetaminophen (PERCOCET) 10-325 MG tablet      oxyCODONE-acetaminophen (PERCOCET) 10-325 MG tablet Take 0.5-1 tablets by mouth every 8 (eight) hours as needed.     oxyCODONE-acetaminophen (PERCOCET) 7.5-325 MG tablet Take 1 tablet by mouth every 6 (six) hours as needed.     oxyCODONE-acetaminophen (PERCOCET) 7.5-325 MG tablet      oxyCODONE-acetaminophen (PERCOCET/ROXICET) 5-325 MG tablet oxycodone-acetaminophen 5 mg-325 mg tablet   Take 1 tablet every 6 hours by oral route for 30 days.     oxyCODONE-acetaminophen (PERCOCET/ROXICET) 5-325 MG tablet oxycodone-acetaminophen 5 mg-325 mg tablet  Take 1 tablet every 6 hours by oral route as needed for 28 days.     propranolol (INDERAL) 10 MG tablet TAKE 1 TABLET THREE TIMES DAILY AS NEEDED FOR SEVERE ANXIETY ATTACKS (LIMITED SUPPLY) 90 tablet 0   rizatriptan (MAXALT-MLT) 10 MG disintegrating tablet Take 1 tablet (10 mg total) by mouth as needed for migraine. May repeat in 2 hours if needed, that is max dose for 24 hours. 10 tablet 3   rosuvastatin (CRESTOR) 40 MG tablet TAKE 1 TABLET EVERY DAY 90 tablet 1   SHINGRIX injection      tiZANidine (ZANAFLEX) 4 MG tablet Take 4 mg by mouth 4 (four) times daily.     traZODone (DESYREL) 100 MG tablet TAKE ONE TABLET BY MOUTH AT BEDTIME FOR SLEEP 90 tablet 0   venlafaxine XR (EFFEXOR-XR) 75 MG 24 hr capsule TAKE ONE CAPSULE BY MOUTH DAILY WITH BREAKFAST 90 capsule 1   Zoledronic Acid (ZOMETA) 4 MG/100ML IVPB Once per year from Orthopedic 100 mL    risperiDONE (RISPERDAL)  1 MG tablet Take 1 tablet (1 mg total) by mouth at bedtime. 30 tablet 1   No current facility-administered medications for this visit.     Musculoskeletal: Strength & Muscle Tone:  UTA Gait & Station:  Seated Patient leans: N/A  Psychiatric Specialty Exam: Review of Systems  Musculoskeletal:  Positive for arthralgias and back pain.  Psychiatric/Behavioral:  Positive for decreased concentration and dysphoric mood. The patient is nervous/anxious.   All other systems reviewed and are negative.   There were no vitals taken for this visit.There is no height or weight on file to calculate BMI.  General Appearance: Casual  Eye Contact:  Fair  Speech:  Clear and Coherent  Volume:  Normal  Mood:  Anxious and Dysphoric  Affect:  Depressed  Thought Process:  Goal Directed and Descriptions of Associations: Intact  Orientation:  Full (Time, Place, and Person)  Thought  Content: Logical   Suicidal Thoughts:  No  Homicidal Thoughts:  No  Memory:  Immediate;   Fair Recent;   Fair Remote;   Fair  Judgement:  Fair  Insight:  Fair  Psychomotor Activity:  Normal  Concentration:  Concentration: Fair and Attention Span: Fair  Recall:  [employer] of Knowledge: Fair  Language: Fair  Akathisia:  No  Handed:  Right  AIMS (if indicated): done  Assets:  Communication Skills Desire for Improvement Housing Social Support Talents/Skills Transportation  ADL's:  Intact  Cognition: WNL  Sleep:  Fair   Screenings: AIMS    Flowsheet Row Video Visit from 06/13/2021 in [facility] Video Visit from 05/16/2021 in [facility]  AIMS Total Score 0 0      GAD-7    Flowsheet Row Office Visit from 05/05/2021 in [facility] Office Visit from 03/14/2021 in [facility] Office Visit from 12/28/2019 in [facility] Office Visit from 03/17/2019 in [facility] Office Visit from 11/20/2018 in [facility]  Total GAD-7 Score 2 2 2 2 4       PHQ2-9    Flowsheet Row Video Visit from 05/16/2021 in [facility] Office Visit from 05/05/2021 in [facility] Office Visit from 03/14/2021 in [facility] Video Visit from 08/02/2020 in [facility] Video Visit from 07/18/2020 in [facility]  PHQ-2 Total Score 6 3 2 1 6   PHQ-9 Total Score 10 4 3 10 21       Flowsheet Row Video Visit from 05/16/2021 in [facility] ED from 03/11/2021 in [facility] Video Visit from 08/02/2020 in [facility]  C-SSRS RISK CATEGORY No Risk No Risk No Risk        Assessment and Plan: Ranasia Massarelli is a 66 year old Caucasian female who has a history of PTSD, bipolar disorder, panic  disorder, migraine headaches, chronic pain was evaluated by telemedicine today.  Patient continues to be in significant pain, had recent readjustment of her gabapentin, struggles with irritability, anxiety, will benefit from the following plan.  Plan Bipolar disorder mixed mild-unstable Mood swings likely due to her pain not being managed. Advised patient to give gabapentin more time.  Currently on gabapentin 600 mg 4 times a day-prescribed by primary care provider. Increase lamotrigine to 100 mg p.o. twice daily Continue risperidone 1 mg p.o. nightly  PTSD-stable Venlafaxine 75 mg p.o. daily Hydroxyzine 25 mg p.o. daily as needed  Panic disorder-unstable Venlafaxine  75 mg p.o. daily Patient is currently on a higher dosage of gabapentin, advised to give it more time.  She will need sufficient pain management  Insomnia-improving Risperidone as noted above. Trazodone 50-100 mg p.o. nightly   Follow-up in clinic in 3 weeks or sooner if needed.  Collaboration of Care: Collaboration of Care: Other patient advised to have sufficient pain management.  Patient/Guardian was advised Release of Information must be obtained prior to any record release in order to collaborate their care with an outside provider. Patient/Guardian was advised if they have not already done so to contact the registration department to sign all necessary forms in order for [place] to release information regarding their care.   Consent: Patient/Guardian gives verbal consent for treatment and assignment of benefits for services provided during this visit. Patient/Guardian expressed understanding and agreed to proceed.   This note was generated in part or whole with voice recognition software. Voice recognition is usually quite accurate but there are transcription errors that can and very often do occur. I apologize for any typographical errors that were not detected and corrected.      Jomarie Longs, MD 06/30/2021, 2:07  PM

## 2021-06-30 DIAGNOSIS — M818 Other osteoporosis without current pathological fracture: Secondary | ICD-10-CM | POA: Diagnosis not present

## 2021-07-03 ENCOUNTER — Ambulatory Visit: Payer: Medicare HMO | Admitting: Family Medicine

## 2021-07-05 ENCOUNTER — Encounter: Payer: Self-pay | Admitting: Family Medicine

## 2021-07-08 ENCOUNTER — Encounter: Payer: Self-pay | Admitting: Family Medicine

## 2021-07-13 ENCOUNTER — Encounter: Payer: Self-pay | Admitting: Family Medicine

## 2021-07-13 ENCOUNTER — Ambulatory Visit (INDEPENDENT_AMBULATORY_CARE_PROVIDER_SITE_OTHER): Payer: Medicare HMO | Admitting: Family Medicine

## 2021-07-13 VITALS — BP 122/50 | HR 60 | Ht 62.0 in | Wt 205.0 lb

## 2021-07-13 DIAGNOSIS — Z981 Arthrodesis status: Secondary | ICD-10-CM | POA: Diagnosis not present

## 2021-07-13 DIAGNOSIS — M5441 Lumbago with sciatica, right side: Secondary | ICD-10-CM

## 2021-07-13 DIAGNOSIS — M5136 Other intervertebral disc degeneration, lumbar region: Secondary | ICD-10-CM

## 2021-07-13 DIAGNOSIS — M48061 Spinal stenosis, lumbar region without neurogenic claudication: Secondary | ICD-10-CM

## 2021-07-13 DIAGNOSIS — G8929 Other chronic pain: Secondary | ICD-10-CM

## 2021-07-13 DIAGNOSIS — M5442 Lumbago with sciatica, left side: Secondary | ICD-10-CM

## 2021-07-13 DIAGNOSIS — M51369 Other intervertebral disc degeneration, lumbar region without mention of lumbar back pain or lower extremity pain: Secondary | ICD-10-CM

## 2021-07-13 NOTE — Progress Notes (Signed)
Subjective:    Patient ID: Ashley Fields, female    DOB: 1955/04/21, 66 y.o.   MRN: [id number]  Ashley Fields is a 66 y.o. female presenting on 07/13/2021 for Back Pain   HPI  Lumbar DDD, Spinal Stenosis Followed by [facility] Dr Lynford Humphrey, [place] Reportedly had Hemi-laminectomy surgery recently, however was told only she had scar tissue cleaned out and cyst excision. She still has back pain and requesting 2nd opinion further evaluation and management Requesting new referral       07/13/2021    9:01 AM 05/16/2021    8:34 AM 05/05/2021   11:23 AM  Depression screen PHQ 2/9  Decreased Interest 1  2  Down, Depressed, Hopeless 1  1  PHQ - 2 Score 2  3  Altered sleeping 1  0  Tired, decreased energy 1  0  Change in appetite 1  1  Feeling bad or failure about yourself  0  0  Trouble concentrating 0  0  Moving slowly or fidgety/restless 0  0  Suicidal thoughts 0  0  PHQ-9 Score 5  4  Difficult doing work/chores   Somewhat difficult     Information is confidential and restricted. Go to Review Flowsheets to unlock data.    Social History   Tobacco Use   Smoking status: Former    Packs/day: 0.25    Years: 10.00    Total pack years: 2.50    Types: Cigarettes    Quit date: 11/29/1995    Years since quitting: 25.6   Smokeless tobacco: Former   Tobacco comments:    Quite 1997, didnt smoke hardly at all when I was smoking.  Vaping Use   Vaping Use: Never used  Substance Use Topics   Alcohol use: No   Drug use: No    Review of Systems Per HPI unless specifically indicated above     Objective:    BP (!) 122/50   Pulse 60   Ht '5\' 2"'$  (1.575 m)   Wt 205 lb (93 kg)   SpO2 97%   BMI 37.49 kg/m   Wt Readings from Last 3 Encounters:  07/13/21 205 lb (93 kg)  05/05/21 205 lb 9.6 oz (93.3 kg)  03/14/21 209 lb 6.4 oz (95 kg)    Physical Exam Vitals and nursing note reviewed.  Constitutional:      General: She is not in acute distress.    Appearance: She is well-developed.  She is not diaphoretic.     Comments: Well-appearing, comfortable, cooperative  HENT:     Head: Normocephalic and atraumatic.  Eyes:     General:        Right eye: No discharge.        Left eye: No discharge.     Conjunctiva/sclera: Conjunctivae normal.  Neck:     Thyroid: No thyromegaly.  Cardiovascular:     Rate and Rhythm: Normal rate and regular rhythm.     Heart sounds: Normal heart sounds. No murmur heard. Pulmonary:     Effort: Pulmonary effort is normal. No respiratory distress.     Breath sounds: Normal breath sounds. No wheezing or rales.  Musculoskeletal:     Cervical back: Normal range of motion and neck supple.     Comments: Using cane for ambulation assistance, limited low back flexibility on ROM  Lymphadenopathy:     Cervical: No cervical adenopathy.  Skin:    General: Skin is warm and dry.     Findings: No erythema or  rash.  Neurological:     Mental Status: She is alert and oriented to person, place, and time.  Psychiatric:        Behavior: Behavior normal.     Comments: Well groomed, good eye contact, normal speech and thoughts    Results for orders placed or performed in visit on 04/28/21  HgB A1c  Result Value Ref Range   Hgb A1c MFr Bld 4.8 <5.7 % of total Hgb   Mean Plasma Glucose 91 mg/dL   eAG (mmol/L) 5.0 mmol/L  CBC with Differential  Result Value Ref Range   WBC 4.4 3.8 - 10.8 Thousand/uL   RBC 4.31 3.80 - 5.10 Million/uL   Hemoglobin 13.3 11.7 - 15.5 g/dL   HCT 41.2 35.0 - 45.0 %   MCV 95.6 80.0 - 100.0 fL   MCH 30.9 27.0 - 33.0 pg   MCHC 32.3 32.0 - 36.0 g/dL   RDW 12.5 11.0 - 15.0 %   Platelets 167 140 - 400 Thousand/uL   MPV 9.5 7.5 - 12.5 fL   Neutro Abs 2,248 1,500 - 7,800 cells/uL   Lymphs Abs 1,386 850 - 3,900 cells/uL   Absolute Monocytes 488 200 - 950 cells/uL   Eosinophils Absolute 238 15 - 500 cells/uL   Basophils Absolute 40 0 - 200 cells/uL   Neutrophils Relative % 51.1 %   Total Lymphocyte 31.5 %   Monocytes Relative  11.1 %   Eosinophils Relative 5.4 %   Basophils Relative 0.9 %  Comprehensive Metabolic Panel (CMET)  Result Value Ref Range   Glucose, Bld 82 65 - 99 mg/dL   BUN 19 7 - 25 mg/dL   Creat 1.07 (H) 0.50 - 1.05 mg/dL   BUN/Creatinine Ratio 18 6 - 22 (calc)   Sodium 142 135 - 146 mmol/L   Potassium 4.3 3.5 - 5.3 mmol/L   Chloride 104 98 - 110 mmol/L   CO2 30 20 - 32 mmol/L   Calcium 9.1 8.6 - 10.4 mg/dL   Total Protein 6.4 6.1 - 8.1 g/dL   Albumin 4.3 3.6 - 5.1 g/dL   Globulin 2.1 1.9 - 3.7 g/dL (calc)   AG Ratio 2.0 1.0 - 2.5 (calc)   Total Bilirubin 0.8 0.2 - 1.2 mg/dL   Alkaline phosphatase (APISO) 46 37 - 153 U/L   AST 31 10 - 35 U/L   ALT 25 6 - 29 U/L  TSH  Result Value Ref Range   TSH 3.29 0.40 - 4.50 mIU/L  Vitamin B12  Result Value Ref Range   Vitamin B-12 831 200 - 1,100 pg/mL  Iron, TIBC and Ferritin Panel  Result Value Ref Range   Iron 66 45 - 160 mcg/dL   TIBC 365 250 - 450 mcg/dL (calc)   %SAT 18 16 - 45 % (calc)   Ferritin 64 16 - 288 ng/mL  Vitamin D (25 hydroxy)  Result Value Ref Range   Vit D, 25-Hydroxy 106 (H) 30 - 100 ng/mL  Lipid panel  Result Value Ref Range   Cholesterol 132 <200 mg/dL   HDL 76 > OR = 50 mg/dL   Triglycerides 79 <150 mg/dL   LDL Cholesterol (Calc) 40 mg/dL (calc)   Total CHOL/HDL Ratio 1.7 <5.0 (calc)   Non-HDL Cholesterol (Calc) 56 <130 mg/dL (calc)      Assessment & Plan:   Problem List Items Addressed This Visit     DDD (degenerative disc disease), lumbar (Chronic)   Relevant Orders   Ambulatory referral to Neurosurgery  Chronic Low back pain (Primary Area of Pain) (Bilateral)  (R>L) (Chronic)   Relevant Orders   Ambulatory referral to Neurosurgery   History of cervical fusion (ACDF C4-C7)   Relevant Orders   Ambulatory referral to Neurosurgery   Other Visit Diagnoses     Spinal stenosis of lumbar region without neurogenic claudication    -  Primary   Relevant Orders   Ambulatory referral to Neurosurgery        Chronic lumbar spine spinal stenosis, spondylosis, neuropathy, recent history spinal surgery L5-S1 Hemi-laminectomy however it was not performed due to scar tissue and cyst excision that was only done at time of surgery despite being told she actually had hemi laminectomy. SHe is requesting 2nd opinion and other direction and evaluation/management for her spine. She has had prior L2-S1 region prior spinal surgery over the years, has had 2 procedures in 5 years.   Referral sent to Dr Sherley Bounds [facility]  No orders of the defined types were placed in this encounter.     Follow up plan: Return if symptoms worsen or fail to improve.   Nobie Putnam, [facility] 07/13/2021, 8:52 AM

## 2021-07-13 NOTE — Patient Instructions (Addendum)
Thank you for coming to the office today.  Referral sent today for 2nd opinion on Lumbar spine to [place] [facility] Sherley Bounds  ------------------  Reference for you [facility] Address: [address], [place], [place] [address] Phone: [phone]   Please schedule a Follow-up Appointment to: Return if symptoms worsen or fail to improve.  If you have any other questions or concerns, please feel free to call the office or send a message through [place]. You may also schedule an earlier appointment if necessary.  Additionally, you may be receiving a survey about your experience at our office within a few days to 1 week by e-mail or mail. We value your feedback.  Nobie Putnam, DO [place]

## 2021-07-14 ENCOUNTER — Other Ambulatory Visit: Payer: Self-pay | Admitting: Family Medicine

## 2021-07-14 DIAGNOSIS — Z1231 Encounter for screening mammogram for malignant neoplasm of breast: Secondary | ICD-10-CM

## 2021-07-26 ENCOUNTER — Other Ambulatory Visit: Payer: Self-pay | Admitting: Family Medicine

## 2021-07-26 DIAGNOSIS — K219 Gastro-esophageal reflux disease without esophagitis: Secondary | ICD-10-CM

## 2021-07-27 DIAGNOSIS — M545 Low back pain, unspecified: Secondary | ICD-10-CM | POA: Diagnosis not present

## 2021-07-27 DIAGNOSIS — M5416 Radiculopathy, lumbar region: Secondary | ICD-10-CM | POA: Diagnosis not present

## 2021-07-27 DIAGNOSIS — M533 Sacrococcygeal disorders, not elsewhere classified: Secondary | ICD-10-CM | POA: Diagnosis not present

## 2021-07-27 DIAGNOSIS — M47896 Other spondylosis, lumbar region: Secondary | ICD-10-CM | POA: Diagnosis not present

## 2021-07-27 DIAGNOSIS — M48061 Spinal stenosis, lumbar region without neurogenic claudication: Secondary | ICD-10-CM | POA: Diagnosis not present

## 2021-07-27 DIAGNOSIS — M25552 Pain in left hip: Secondary | ICD-10-CM | POA: Diagnosis not present

## 2021-07-27 DIAGNOSIS — M479 Spondylosis, unspecified: Secondary | ICD-10-CM | POA: Diagnosis not present

## 2021-07-27 DIAGNOSIS — Z79899 Other long term (current) drug therapy: Secondary | ICD-10-CM | POA: Diagnosis not present

## 2021-07-27 DIAGNOSIS — G894 Chronic pain syndrome: Secondary | ICD-10-CM | POA: Diagnosis not present

## 2021-07-27 NOTE — Telephone Encounter (Signed)
Requested Prescriptions  Pending Prescriptions Disp Refills  . omeprazole (PRILOSEC) 40 MG capsule [Pharmacy Med Name: OMEPRAZOLE DR 40 MG CAPSULE] 90 capsule 1    Sig: TAKE 1 CAPSULE BY MOUTH EVERY DAY     Gastroenterology: Proton Pump Inhibitors Passed - 07/26/2021  3:22 AM      Passed - Valid encounter within last 12 months    Recent Outpatient Visits          2 weeks ago Spinal stenosis of lumbar region without neurogenic claudication   [place], DO   2 months ago Annual physical exam   [place], DO   4 months ago Migraine without aura and with status migrainosus, not intractable   [place], DO   4 months ago Acute non-recurrent frontal sinusitis   [place], DO   7 months ago Acute right-sided low back pain with right-sided sciatica   [place], DO

## 2021-07-28 ENCOUNTER — Encounter: Payer: Self-pay | Admitting: Psychiatry

## 2021-07-28 ENCOUNTER — Telehealth (INDEPENDENT_AMBULATORY_CARE_PROVIDER_SITE_OTHER): Payer: Medicare HMO | Admitting: Psychiatry

## 2021-07-28 DIAGNOSIS — F3178 Bipolar disorder, in full remission, most recent episode mixed: Secondary | ICD-10-CM | POA: Diagnosis not present

## 2021-07-28 DIAGNOSIS — G4701 Insomnia due to medical condition: Secondary | ICD-10-CM

## 2021-07-28 DIAGNOSIS — F410 Panic disorder [episodic paroxysmal anxiety] without agoraphobia: Secondary | ICD-10-CM | POA: Diagnosis not present

## 2021-07-28 DIAGNOSIS — F4310 Post-traumatic stress disorder, unspecified: Secondary | ICD-10-CM

## 2021-07-28 NOTE — Progress Notes (Signed)
Virtual Visit via Video Note  I connected with Ashley Fields on 07/28/21 at 10:00 AM EDT by a video enabled telemedicine application and verified that I am speaking with the correct person using two identifiers.  Location Provider Location : [facility] Patient Location : Home  Participants: Patient , Provider    I discussed the limitations of evaluation and management by telemedicine and the availability of in person appointments. The patient expressed understanding and agreed to proceed.    I discussed the assessment and treatment plan with the patient. The patient was provided an opportunity to ask questions and all were answered. The patient agreed with the plan and demonstrated an understanding of the instructions.   The patient was advised to call back or seek an in-person evaluation if the symptoms worsen or if the condition fails to improve as anticipated.   [place] MD OP Progress Note  07/28/2021 12:41 PM Ashley Fields  MRN:  [id number]  Chief Complaint:  Chief Complaint  Patient presents with   Follow-up: 66 year old Caucasian female with history of bipolar disorder, PTSD, panic attacks, insomnia, presented for medication management.     HPI: Ashley Fields is a 66 year old Caucasian female, married on disability, lives in [facility], has a history of bipolar disorder, PTSD, chronic pain, migraine headaches, history of laparoscopic gastric bypass was evaluated by telemedicine today.  Patient today reports she stopped taking the risperidone and the Lamictal.  She reports since stopping those medications she feels her mood swings and irritability has improved.  Patient reports she does not have much conflict with her spouse anymore since stopping those medications.  Patient denies any sadness or hopelessness.  Reports she does not have any significant anhedonia however the pain does limit her ability to do things which she would like to.  Patient reports sleep is better than before.  Does  take trazodone as needed.  Patient denies any suicidality, homicidality or perceptual disturbances.  Patient reports she continues to take the venlafaxine, trazodone and propranolol which she takes as needed.  She would like to stay on these medications.  She is on gabapentin for her pain which is also a mood stabilizer, agrees to continue the same.  Patient denies any other concerns today.  Visit Diagnosis:    ICD-10-CM   1. Bipolar disorder, in full remission, most recent episode mixed ([place])  F31.78    mild    2. PTSD (post-traumatic stress disorder)  F43.10     3. Panic disorder  F41.0     4. Insomnia due to medical condition  G47.01    pain, mood      Past Psychiatric History: Reviewed past psychiatric history from progress note on 05/15/2018.  Past trials of Prozac, Wellbutrin, Lexapro, Cymbalta, Rexulti, Ambien-nightmares, Lunesta.  Past Medical History:  Past Medical History:  Diagnosis Date   Allergy    Anemia    Anxiety    Arthritis    Yes   Blood transfusion without reported diagnosis 1977   Transfusions from miscarriage & hemorrhaging   Chronic kidney disease    Depression    Disp fx of cuboid bone of right foot, init for clos fx 01/01/2017   GERD (gastroesophageal reflux disease)    Headache    Hepatitis C    Hyperlipidemia    Neuromuscular disorder ([place]) 1997   Falling to much was an eye-opener   Osteoporosis    Thyroid disease    Urine incontinence     Past Surgical History:  Procedure Laterality  Date   5 miscarriages     1977-1985, Blood transfusion s/p miscarriage [place]   Total   APPENDECTOMY  12/2008   AUGMENTATION MAMMAPLASTY Bilateral 2015   Bilat   AUGMENTATION MAMMAPLASTY Bilateral 2018   implants redone w/ placement of implants under muscle   breast lift bilateral, implants  82/50/0370   bilateral, silicon naturel   BREAST REDUCTION SURGERY Bilateral 1997   CESAREAN SECTION  07/27/1981   Placenta Previa    CHOLECYSTECTOMY  03/2004   Lap surgery   COLONOSCOPY WITH PROPOFOL N/A 06/13/2020   Procedure: COLONOSCOPY WITH PROPOFOL;  Surgeon: Virgel Manifold, MD;  Location: [facility] ENDOSCOPY;  Service: Endoscopy;  Laterality: N/A;   COSMETIC SURGERY     Breast implants w/ lift, tummy tuck, upper & lower Blepharop   ESOPHAGOGASTRODUODENOSCOPY (EGD) WITH PROPOFOL N/A 06/13/2020   Procedure: ESOPHAGOGASTRODUODENOSCOPY (EGD) WITH PROPOFOL;  Surgeon: Virgel Manifold, MD;  Location: [facility] ENDOSCOPY;  Service: Endoscopy;  Laterality: N/A;   EYE SURGERY  2017   Cataract surgery & lasik   GASTRIC BYPASS  2005   Laparoscopic   IVC FILTER INSERTION  12/2003   TrapEase Vena Cava Filter   LUMBAR LAMINECTOMY  06/2006   L4-L5 (spinal fusion)   mini tummy tuck  05/07/2016   Bilateral bra/back roll lift skin removal   neck surgery C3-C7  02/10/2015   ACDF   REDUCTION MAMMAPLASTY Bilateral 1997   SHOULDER ACROMIOPLASTY Left 03/27/2013   w/ labral debridement   SMALL INTESTINE SURGERY  12/24/2003   Lap Gastric Bypass   SPINAL CORD STIMULATOR IMPLANT  11/2010   removed 05/2011   SPINE SURGERY  2008 2017   Fusion L4-L5, ACDF C4-C7    Family Psychiatric History: Reviewed family psychiatric history from progress note on 05/15/2018.  Family History:  Family History  Problem Relation Age of Onset   COPD Mother    Lung cancer Mother    Diabetes Mother    Heart disease Mother    Stroke Mother    Heart attack Mother    Arthritis Mother    Cancer Mother    Hypertension Mother    Obesity Mother    Varicose Veins Mother    Heart disease Father    Heart attack Father 36   Early death Father    Depression Sister        x 49 sisters   COPD Sister    Hypertension Sister    Heart disease Brother        x 3 brothers   Diabetes Brother    Colon polyps Sister    Hearing loss Sister    Heart attack Sister    Heart attack Brother    Arthritis Sister    Varicose Veins Sister    Arthritis Brother    Heart  disease Brother    Hypertension Brother    Cancer Maternal Grandfather    Stroke Maternal Grandfather    COPD Sister    Obesity Sister    Depression Sister    Depression Sister    Heart disease Sister    Hypertension Sister    Diabetes Brother    Heart disease Brother    Hypertension Brother    Obesity Brother    Varicose Veins Brother    Heart disease Brother    Hypertension Brother    Obesity Brother    Hypertension Sister    Obesity Sister    Miscarriages / Stillbirths Maternal Aunt  Miscarriages / Stillbirths Paternal Aunt    Breast cancer Neg Hx    Social History: Reviewed social history from progress note on 05/15/2018. Social History   Socioeconomic History   Marital status: Married    Spouse name: Not on file   Number of children: 1   Years of education: High School   Highest education level: Some college, no degree  Occupational History   Occupation: disability  Tobacco Use   Smoking status: Former    Packs/day: 0.25    Years: 10.00    Total pack years: 2.50    Types: Cigarettes    Quit date: 11/29/1995    Years since quitting: 25.6   Smokeless tobacco: Former   Tobacco comments:    Quite 1997, didnt smoke hardly at all when I was smoking.  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]   Social Determinants of Health   Financial Resource Strain: [place]  (04/06/2021)   Overall Financial Resource Strain (CARDIA)    Difficulty of Paying Living Expenses: Not very hard  Food Insecurity: No Food Insecurity (04/06/2021)   Hunger Vital Sign    Worried About Running Out of Food in the Last Year: Never true     Ran Out of Food in the Last Year: Never true  Transportation Needs: No Transportation Needs (04/06/2021)   PRAPARE - Hydrologist (Medical): No    Lack of Transportation (Non-Medical): No  Physical Activity: Insufficiently Active (04/06/2021)   Exercise Vital Sign    Days of Exercise per Week: 2 days    Minutes of Exercise per Session: 10 min  Stress: Stress Concern Present (04/06/2021)   [place]    Feeling of Stress : Very much  Social Connections: Moderately Isolated (04/06/2021)   Social Connection and Isolation Panel [NHANES]    Frequency of Communication with Friends and Family: More than three times a week    Frequency of Social Gatherings with Friends and Family: More than three times a week    Attends Religious Services: Never    Marine scientist or Organizations: No    Attends Archivist Meetings: Never    Marital Status: Married    Allergies:  Allergies  Allergen Reactions   Flagyl [Metronidazole] Anaphylaxis   Cymbalta [Duloxetine Hcl]    Tape Rash    Metabolic Disorder Labs: Lab Results  Component Value Date   HGBA1C 4.8 04/28/2021   MPG 91 04/28/2021   MPG 97 03/30/2020   No results found for: "PROLACTIN" Lab Results  Component Value Date   CHOL 132 04/28/2021   TRIG 79 04/28/2021   HDL 76 04/28/2021   CHOLHDL 1.7 04/28/2021   LDLCALC 40 04/28/2021   LDLCALC 49 03/30/2020   Lab Results  Component Value Date   TSH 3.29 04/28/2021   TSH 1.97 03/30/2020    Therapeutic Level Labs: No results found for: "LITHIUM" No results found for: "VALPROATE" No results found for: "CBMZ"  Current Medications: Current Outpatient Medications  Medication Sig Dispense Refill   ASPIRIN LOW DOSE  81 MG EC tablet TAKE 1 TABLET BY MOUTH EVERY DAY 90 tablet 3   butalbital-acetaminophen-caffeine (FIORICET) 50-325-40 MG tablet Take 1 tablet by mouth 2 (two) times  daily as needed for headache.     Cholecalciferol 250 MCG (10000 UT) CAPS Take by mouth.     cyanocobalamin (,VITAMIN B-12,) 1000 MCG/ML injection Inject 1,000 mcg into the muscle every 30 (thirty) days.     diclofenac Sodium (VOLTAREN) 1 % GEL Apply 2 g topically 4 (four) times daily.     estradiol (ESTRACE) 1 MG tablet TAKE 1 TABLET EVERY DAY 90 tablet 1   etodolac (LODINE) 500 MG tablet TAKE 1 TABLET BY MOUTH TWICE A DAY 180 tablet 1   ezetimibe (ZETIA) 10 MG tablet Take 1 tablet (10 mg total) by mouth daily. 90 tablet 1   fluticasone (FLONASE) 50 MCG/ACT nasal spray SPRAY 2 SPRAYS INTO EACH NOSTRIL EVERY DAY 48 mL 2   furosemide (LASIX) 20 MG tablet Take 1 tablet (20 mg total) by mouth daily as needed for fluid or edema. 90 tablet 1   gabapentin (NEURONTIN) 600 MG tablet Take 1 tablet (600 mg total) by mouth 3 (three) times daily. (Patient taking differently: Take 600 mg by mouth in the morning, at noon, in the evening, and at bedtime.) 90 tablet 3   hydrOXYzine (ATARAX/VISTARIL) 25 MG tablet TAKE 1/2 TO 1 TABLET BY MOUTH TWICE A DAY AS NEEDED FOR SEVERE PANIC ATTACKS/ ANXIETY 180 tablet 1   mometasone (ELOCON) 0.1 % cream Apply 1 application topically 2 (two) times daily.     omeprazole (PRILOSEC) 40 MG capsule TAKE 1 CAPSULE BY MOUTH EVERY DAY 90 capsule 1   ondansetron (ZOFRAN ODT) 4 MG disintegrating tablet Take 1 tablet (4 mg total) by mouth every 8 (eight) hours as needed for nausea or vomiting. 30 tablet 2   oxyCODONE (OXY IR/ROXICODONE) 5 MG immediate release tablet Take 10 mg by mouth every 6 (six) hours as needed for severe pain.     propranolol (INDERAL) 10 MG tablet TAKE 1 TABLET THREE TIMES DAILY AS NEEDED FOR SEVERE ANXIETY ATTACKS (LIMITED SUPPLY) 90 tablet 0   rizatriptan (MAXALT-MLT) 10 MG disintegrating tablet Take 1 tablet (10 mg total) by mouth as needed for migraine. May repeat in 2 hours if needed, that is max dose for 24 hours. 10 tablet 3   rosuvastatin (CRESTOR) 40 MG  tablet TAKE 1 TABLET EVERY DAY 90 tablet 1   SHINGRIX injection      tiZANidine (ZANAFLEX) 4 MG tablet Take 4 mg by mouth 4 (four) times daily.     traZODone (DESYREL) 100 MG tablet TAKE ONE TABLET BY MOUTH AT BEDTIME FOR SLEEP 90 tablet 0   venlafaxine XR (EFFEXOR-XR) 75 MG 24 hr capsule TAKE ONE CAPSULE BY MOUTH DAILY WITH BREAKFAST 90 capsule 1   Zoledronic Acid (ZOMETA) 4 MG/100ML IVPB Once per year from Orthopedic 100 mL    oxyCODONE-acetaminophen (PERCOCET) 7.5-325 MG tablet Take 1 tablet by mouth every 6 (six) hours as needed. (Patient not taking: Reported on 07/28/2021)     No current facility-administered medications for this visit.     Musculoskeletal: Strength & Muscle Tone:  UTA Gait & Station:  Seated Patient leans: N/A  Psychiatric Specialty Exam: Review of Systems  Musculoskeletal:  Positive for back pain.  Psychiatric/Behavioral:  The patient is nervous/anxious.   All other systems reviewed and are negative.   There were no vitals taken for this visit.There is no height or weight on  file to calculate BMI.  General Appearance: Casual  Eye Contact:  Fair  Speech:  Clear and Coherent  Volume:  Normal  Mood:  Anxious, due to pain coping well  Affect:  Congruent  Thought Process:  Goal Directed and Descriptions of Associations: Intact  Orientation:  Full (Time, Place, and Person)  Thought Content: Logical   Suicidal Thoughts:  No  Homicidal Thoughts:  No  Memory:  Immediate;   Fair Recent;   Fair Remote;   Fair  Judgement:  Fair  Insight:  Fair  Psychomotor Activity:  Normal  Concentration:  Concentration: Fair and Attention Span: Fair  Recall:  [employer] of Knowledge: Fair  Language: Fair  Akathisia:  No  Handed:  Right  AIMS (if indicated): not done  Assets:  Communication Skills Desire for Improvement Housing Social Support  ADL's:  Intact  Cognition: WNL  Sleep:  Fair   Screenings: AIMS    Flowsheet Row Video Visit from 06/13/2021 in [place] Video Visit from 05/16/2021 in [place] Total Score 0 0      GAD-7    [place] Office Visit from 07/13/2021 in [facility] Office Visit from 05/05/2021 in [facility] Office Visit from 03/14/2021 in [facility] Office Visit from 12/28/2019 in [facility] Office Visit from 03/17/2019 in [facility]  Total GAD-7 Score '2 2 2 2 2      '$ PHQ2-9    Flowsheet Row Video Visit from 07/28/2021 in [place] Office Visit from 07/13/2021 in [facility] Video Visit from 05/16/2021 in [place] Office Visit from 05/05/2021 in [facility] Office Visit from 03/14/2021 in [place]  PHQ-2 Total Score 0 '2 6 3 2  '$ PHQ-9 Total Score -- '5 10 4 3      '$ Flowsheet Row Video Visit from 07/28/2021 in [place] Video Visit from 05/16/2021 in [facility] from 03/11/2021 in [place] No Risk No Risk No Risk        Assessment and Plan: Ashley Fields is a 66 year old Caucasian female who has a history of PTSD, bipolar disorder, panic disorder, migraine headaches, chronic pain was evaluated by telemedicine today.  Patient currently noncompliant on her mood stabilizers, reports mood symptoms as improved except for her anxiety about her chronic pain.  Patient will benefit from the following plan.  Plan Bipolar disorder type I mixed in remission Discontinue Lamictal and risperidone for noncompliance. Continue gabapentin 600 mg 4 times a day-which she takes for her pain following is also a mood stabilizer.   PTSD-stable Venlafaxine 75 mg p.o. daily Hydroxyzine 25 mg p.o. daily as needed  Panic disorder-stable Venlafaxine 75 mg p.o. daily Propranolol  10 mg 3 times a day as needed for severe anxiety.  Insomnia-stable Trazodone 50-100 mg p.o. nightly.  Patient has upcoming appointment with a new provider for pain, for a second opinion.  Patient will need sufficient pain management.  Follow-up in clinic in 3 months or sooner if needed.  Collaboration of Care: Collaboration of Care: Encouraged to  continue follow-up with pain provider.  Patient/Guardian was advised Release of Information must be obtained prior to any record release in order to collaborate their care with an outside provider. Patient/Guardian was advised if they have not already done so to contact  the registration department to sign all necessary forms in order for [place] to release information regarding their care.   Consent: Patient/Guardian gives verbal consent for treatment and assignment of benefits for services provided during this visit. Patient/Guardian expressed understanding and agreed to proceed.   This note was generated in part or whole with voice recognition software. Voice recognition is usually quite accurate but there are transcription errors that can and very often do occur. I apologize for any typographical errors that were not detected and corrected.          Ursula Alert, MD 07/28/2021, 12:41 PM

## 2021-08-08 DIAGNOSIS — M5416 Radiculopathy, lumbar region: Secondary | ICD-10-CM | POA: Diagnosis not present

## 2021-08-10 ENCOUNTER — Encounter: Payer: Self-pay | Admitting: Family Medicine

## 2021-08-10 DIAGNOSIS — R9389 Abnormal findings on diagnostic imaging of other specified body structures: Secondary | ICD-10-CM

## 2021-08-10 NOTE — Addendum Note (Signed)
Addended by: Olin Hauser on: 08/10/2021 07:16 PM   Modules accepted: Orders

## 2021-08-15 DIAGNOSIS — M48061 Spinal stenosis, lumbar region without neurogenic claudication: Secondary | ICD-10-CM | POA: Diagnosis not present

## 2021-08-15 DIAGNOSIS — M5416 Radiculopathy, lumbar region: Secondary | ICD-10-CM | POA: Diagnosis not present

## 2021-08-15 DIAGNOSIS — M5116 Intervertebral disc disorders with radiculopathy, lumbar region: Secondary | ICD-10-CM | POA: Diagnosis not present

## 2021-08-21 ENCOUNTER — Other Ambulatory Visit: Payer: Self-pay | Admitting: Family Medicine

## 2021-08-21 DIAGNOSIS — E782 Mixed hyperlipidemia: Secondary | ICD-10-CM

## 2021-08-21 DIAGNOSIS — H34211 Partial retinal artery occlusion, right eye: Secondary | ICD-10-CM

## 2021-08-21 NOTE — Telephone Encounter (Signed)
Requested Prescriptions  Pending Prescriptions Disp Refills  . ASPIRIN LOW DOSE 81 MG tablet [Pharmacy Med Name: ASPIRIN EC 81 MG TABLET] 90 tablet 0    Sig: TAKE 1 TABLET BY MOUTH EVERY DAY     Analgesics:  NSAIDS - aspirin Failed - 08/21/2021  2:24 AM      Failed - Cr in normal range and within 360 days    Creat  Date Value Ref Range Status  04/28/2021 1.07 (H) 0.50 - 1.05 mg/dL Final         Passed - eGFR is 10 or above and within 360 days    GFR, Est African American  Date Value Ref Range Status  03/30/2020 73 > OR = 60 mL/min/1.58m Final   GFR, Est Non African American  Date Value Ref Range Status  03/30/2020 63 > OR = 60 mL/min/1.733mFinal   GFR, Estimated  Date Value Ref Range Status  03/11/2021 >60 >60 mL/min Final    Comment:    (NOTE) Calculated using the CKD-EPI Creatinine Equation (2021)          Passed - Patient is not pregnant      Passed - Valid encounter within last 12 months    Recent Outpatient Visits          1 month ago Spinal stenosis of lumbar region without neurogenic claudication   [place]   3 months ago Annual physical exam   [facility]   5 months ago Migraine without aura and with status migrainosus, not intractable   [facility]   5 months ago Acute non-recurrent frontal sinusitis   [facility]   8 months ago Acute right-sided low back pain with right-sided sciatica   [facility]

## 2021-08-24 ENCOUNTER — Ambulatory Visit: Payer: Self-pay

## 2021-08-24 ENCOUNTER — Telehealth: Payer: Medicare HMO | Admitting: Physician Assistant

## 2021-08-24 DIAGNOSIS — H9202 Otalgia, left ear: Secondary | ICD-10-CM | POA: Diagnosis not present

## 2021-08-24 DIAGNOSIS — Z6838 Body mass index (BMI) 38.0-38.9, adult: Secondary | ICD-10-CM | POA: Diagnosis not present

## 2021-08-24 DIAGNOSIS — M5416 Radiculopathy, lumbar region: Secondary | ICD-10-CM | POA: Diagnosis not present

## 2021-08-24 MED ORDER — AMOXICILLIN-POT CLAVULANATE 875-125 MG PO TABS: 1.0000 | ORAL_TABLET | Freq: Two times a day (BID) | ORAL | 0 refills | Status: AC

## 2021-08-24 MED ORDER — OFLOXACIN 0.3 % OP SOLN: 10.0000 [drp] | Freq: Every day | OPHTHALMIC | 0 refills | Status: AC

## 2021-08-24 NOTE — Progress Notes (Signed)
[name], [place] are scheduled for a virtual visit with your provider today.    Just as we do with appointments in the office, we must obtain your consent to participate.  Your consent will be active for this visit and any virtual visit you may have with one of our providers in the next 365 days.    If you have a MyChart account, I can also send a copy of this consent to you electronically.  All virtual visits are billed to your insurance company just like a traditional visit in the office.  As this is a virtual visit, video technology does not allow for your provider to perform a traditional examination.  This may limit your provider's ability to fully assess your condition.  If your provider identifies any concerns that need to be evaluated in person or the need to arrange testing such as labs, EKG, etc, we will make arrangements to do so.    Although advances in technology are sophisticated, we cannot ensure that it will always work on either your end or our end.  If the connection with a video visit is poor, we may have to switch to a telephone visit.  With either a video or telephone visit, we are not always able to ensure that we have a secure connection.   I need to obtain your verbal consent now.   Are you willing to proceed with your visit today?   Ashley Fields has provided verbal consent on 08/24/2021 for a virtual visit (video or telephone).   Rodney Booze, PA-C 08/24/2021  4:21 PM   Date:  08/24/2021   ID:  Ashley Fields, DOB 20-Jul-1955, MRN [id number]  Patient Location: Home Provider Location: Home Office   Participants: Patient and Provider for Visit and Wrap up  Method of visit: Video  Location of Patient: Home Location of Provider: Home Office Consent was obtain for visit over the video. Services rendered by provider: Visit was performed via video  A video enabled telemedicine application was used and I verified that I am speaking with the correct person using two  identifiers.  PCP:  Olin Hauser, DO   Chief Complaint:  ear pain  History of Present Illness:    Ashley Fields is a 66 y.o. female with history as stated below. Presents video telehealth for an acute care visit  Pt c/o left ear pain for several days. She reports she has had so drainage from the ear. Denies sweats, chills, rhinorrhea, congestion, sore throat, cough. Does not use qtips. Has not been swimming recently.   Past Medical, Surgical, Social History, Allergies, and Medications have been Reviewed.  Past Medical History:  Diagnosis Date   Allergy    Anemia    Anxiety    Arthritis    Yes   Blood transfusion without reported diagnosis 1977   Transfusions from miscarriage & hemorrhaging   Chronic kidney disease    Depression    Disp fx of cuboid bone of right foot, init for clos fx 01/01/2017   GERD (gastroesophageal reflux disease)    Headache    Hepatitis C    Hyperlipidemia    Neuromuscular disorder ([place]) 1997   Falling to much was an eye-opener   Osteoporosis    Thyroid disease    Urine incontinence     Current Meds  Medication Sig   amoxicillin-clavulanate (AUGMENTIN) 875-125 MG tablet Take 1 tablet by mouth 2 (two) times daily for 7 days.   ofloxacin (OCUFLOX) 0.3 % ophthalmic  solution Place 10 drops into the left ear daily for 7 days.     Allergies:   Flagyl [metronidazole], Cymbalta [duloxetine hcl], and Tape   ROS See HPI for history of present illness.  Physical Exam HENT:     Ears:     Comments: Denies TTP to the mastoid on self exam            MDM: pt with left ear pain and drainage. Exam limited due to modality of visit. Suspect possible om or oe. Rx for augmentin and ofloxacin given.   There are no diagnoses linked to this encounter.   Time:   Today, I have spent 5 minutes with the patient with telehealth technology discussing the above problems, reviewing the chart, previous notes, medications and orders.    Tests  Ordered: No orders of the defined types were placed in this encounter.   Medication Changes: Meds ordered this encounter  Medications   amoxicillin-clavulanate (AUGMENTIN) 875-125 MG tablet    Sig: Take 1 tablet by mouth 2 (two) times daily for 7 days.    Dispense:  14 tablet    Refill:  0    Order Specific Question:   Supervising Provider    Answer:   MILLER, BRIAN [[id number]]   ofloxacin (OCUFLOX) 0.3 % ophthalmic solution    Sig: Place 10 drops into the left ear daily for 7 days.    Dispense:  3.5 mL    Refill:  0    Order Specific Question:   Supervising Provider    Answer:   Noemi Chapel [[id number]]     Disposition:  Follow up  Signed, [place], PA-C  08/24/2021 4:21 PM

## 2021-08-24 NOTE — Telephone Encounter (Signed)
  Chief Complaint: earache Symptoms: L ear pain, 4-5/10, throbbing pain, radiating down into neck Frequency: 2-3 days Pertinent Negatives: Patient denies runny nose, cough, fever Disposition: '[]'$ ED /'[]'$ Urgent Care (no appt availability in office) / '[]'$ Appointment(In office/virtual)/ '[x]'$  [facility]/ '[]'$ Home Care/ '[]'$ Refused Recommended Disposition /'[]'$  Mobile Bus/ '[]'$  Follow-up with PCP Additional Notes: pt has been taken Tylenol for pain but not really helping. Advised no appts with practice and d/t needing visit for abx, scheduled virtual UC today at 4:15.   Reason for Disposition  Earache  (Exceptions: brief ear pain of < 60 minutes duration, earache occurring during air travel  Answer Assessment - Initial Assessment Questions 1. LOCATION: "Which ear is involved?"     L earache 2. ONSET: "When did the ear start hurting"      2-3 days  3. SEVERITY: "How bad is the pain?"  (Scale 1-10; mild, moderate or severe)   - MILD (1-3): doesn't interfere with normal activities    - MODERATE (4-7): interferes with normal activities or awakens from sleep    - SEVERE (8-10): excruciating pain, unable to do any normal activities      4-5 4. URI SYMPTOMS: "Do you have a runny nose or cough?"     no 5. FEVER: "Do you have a fever?" If Yes, ask: "What is your temperature, how was it measured, and when did it start?"     No 7. OTHER SYMPTOMS: "Do you have any other symptoms?" (e.g., headache, stiff neck, dizziness, vomiting, runny nose, decreased hearing)     Pain going into neck  Protocols used: Earache-A-AH

## 2021-08-24 NOTE — Patient Instructions (Signed)
[name], thank you for joining Rodney Booze, PA-C for today's virtual visit.  While this provider is not your primary care provider (PCP), if your PCP is located in our provider database this encounter information will be shared with them immediately following your visit.  Consent: (Patient) Ashley Fields provided verbal consent for this virtual visit at the beginning of the encounter.  Current Medications:  Current Outpatient Medications:    ASPIRIN LOW DOSE 81 MG tablet, TAKE 1 TABLET BY MOUTH EVERY DAY, Disp: 90 tablet, Rfl: 0   butalbital-acetaminophen-caffeine (FIORICET) 50-325-40 MG tablet, Take 1 tablet by mouth 2 (two) times daily as needed for headache., Disp: , Rfl:    Cholecalciferol 250 MCG (10000 UT) CAPS, Take by mouth., Disp: , Rfl:    cyanocobalamin (,VITAMIN B-12,) 1000 MCG/ML injection, Inject 1,000 mcg into the muscle every 30 (thirty) days., Disp: , Rfl:    diclofenac Sodium (VOLTAREN) 1 % GEL, Apply 2 g topically 4 (four) times daily., Disp: , Rfl:    estradiol (ESTRACE) 1 MG tablet, TAKE 1 TABLET EVERY DAY, Disp: 90 tablet, Rfl: 1   etodolac (LODINE) 500 MG tablet, TAKE 1 TABLET BY MOUTH TWICE A DAY, Disp: 180 tablet, Rfl: 1   ezetimibe (ZETIA) 10 MG tablet, Take 1 tablet (10 mg total) by mouth daily., Disp: 90 tablet, Rfl: 1   fluticasone (FLONASE) 50 MCG/ACT nasal spray, SPRAY 2 SPRAYS INTO EACH NOSTRIL EVERY DAY, Disp: 48 mL, Rfl: 2   furosemide (LASIX) 20 MG tablet, Take 1 tablet (20 mg total) by mouth daily as needed for fluid or edema., Disp: 90 tablet, Rfl: 1   gabapentin (NEURONTIN) 600 MG tablet, Take 1 tablet (600 mg total) by mouth 3 (three) times daily. (Patient taking differently: Take 600 mg by mouth in the morning, at noon, in the evening, and at bedtime.), Disp: 90 tablet, Rfl: 3   hydrOXYzine (ATARAX/VISTARIL) 25 MG tablet, TAKE 1/2 TO 1 TABLET BY MOUTH TWICE A DAY AS NEEDED FOR SEVERE PANIC ATTACKS/ ANXIETY, Disp: 180 tablet, Rfl: 1   mometasone  (ELOCON) 0.1 % cream, Apply 1 application topically 2 (two) times daily., Disp: , Rfl:    omeprazole (PRILOSEC) 40 MG capsule, TAKE 1 CAPSULE BY MOUTH EVERY DAY, Disp: 90 capsule, Rfl: 1   ondansetron (ZOFRAN ODT) 4 MG disintegrating tablet, Take 1 tablet (4 mg total) by mouth every 8 (eight) hours as needed for nausea or vomiting., Disp: 30 tablet, Rfl: 2   oxyCODONE (OXY IR/ROXICODONE) 5 MG immediate release tablet, Take 10 mg by mouth every 6 (six) hours as needed for severe pain., Disp: , Rfl:    oxyCODONE-acetaminophen (PERCOCET) 7.5-325 MG tablet, Take 1 tablet by mouth every 6 (six) hours as needed. (Patient not taking: Reported on 07/28/2021), Disp: , Rfl:    propranolol (INDERAL) 10 MG tablet, TAKE 1 TABLET THREE TIMES DAILY AS NEEDED FOR SEVERE ANXIETY ATTACKS (LIMITED SUPPLY), Disp: 90 tablet, Rfl: 0   rizatriptan (MAXALT-MLT) 10 MG disintegrating tablet, Take 1 tablet (10 mg total) by mouth as needed for migraine. May repeat in 2 hours if needed, that is max dose for 24 hours., Disp: 10 tablet, Rfl: 3   rosuvastatin (CRESTOR) 40 MG tablet, TAKE 1 TABLET EVERY DAY, Disp: 90 tablet, Rfl: 1   SHINGRIX injection, , Disp: , Rfl:    tiZANidine (ZANAFLEX) 4 MG tablet, Take 4 mg by mouth 4 (four) times daily., Disp: , Rfl:    traZODone (DESYREL) 100 MG tablet, TAKE ONE TABLET BY  MOUTH AT BEDTIME FOR SLEEP, Disp: 90 tablet, Rfl: 0   venlafaxine XR (EFFEXOR-XR) 75 MG 24 hr capsule, TAKE ONE CAPSULE BY MOUTH DAILY WITH BREAKFAST, Disp: 90 capsule, Rfl: 1   Zoledronic Acid (ZOMETA) 4 MG/100ML IVPB, Once per year from Orthopedic, Disp: 100 mL, Rfl:    Medications ordered in this encounter:  No orders of the defined types were placed in this encounter.    *If you need refills on other medications prior to your next appointment, please contact your pharmacy*  Follow-Up: Call back or seek an in-person evaluation if the symptoms worsen or if the condition fails to improve as anticipated.  Other  Instructions You were given a prescription for antibiotics. Please take the antibiotic prescription fully.   Follow up with your regular doctor in 1 week for reassessment and seek care sooner if your symptoms worsen or fail to improve.    If you have been instructed to have an in-person evaluation today at a local Urgent Care facility, please use the link below. It will take you to a list of all of our available [facility], including address, phone number and hours of operation. Please do not delay care.  [facility]  If you or a family member do not have a primary care provider, use the link below to schedule a visit and establish care. When you choose a [facility] primary care physician or advanced practice provider, you gain a long-term partner in health. Find a Primary Care Provider  Learn more about 's in-office and virtual care options: [facility]

## 2021-08-25 ENCOUNTER — Other Ambulatory Visit (HOSPITAL_COMMUNITY): Payer: Self-pay | Admitting: Student

## 2021-08-25 ENCOUNTER — Other Ambulatory Visit: Payer: Self-pay | Admitting: Student

## 2021-08-25 DIAGNOSIS — M5416 Radiculopathy, lumbar region: Secondary | ICD-10-CM

## 2021-08-28 ENCOUNTER — Encounter: Payer: Self-pay | Admitting: Family Medicine

## 2021-08-28 DIAGNOSIS — G894 Chronic pain syndrome: Secondary | ICD-10-CM

## 2021-08-28 DIAGNOSIS — G8929 Other chronic pain: Secondary | ICD-10-CM

## 2021-08-28 MED ORDER — GABAPENTIN 600 MG PO TABS: 600.0000 mg | ORAL_TABLET | Freq: Four times a day (QID) | ORAL | 1 refills | Status: DC

## 2021-08-31 ENCOUNTER — Other Ambulatory Visit: Payer: Self-pay

## 2021-08-31 DIAGNOSIS — D508 Other iron deficiency anemias: Secondary | ICD-10-CM

## 2021-09-01 ENCOUNTER — Ambulatory Visit
Admission: RE | Admit: 2021-09-01 | Discharge: 2021-09-01 | Disposition: A | Discharge status: Home / Self Care | Payer: Medicare HMO | Source: Ambulatory Visit | Attending: Student | Admitting: Student

## 2021-09-01 ENCOUNTER — Other Ambulatory Visit: Payer: Self-pay

## 2021-09-01 ENCOUNTER — Inpatient Hospital Stay: Payer: Medicare HMO | Attending: Internal Medicine

## 2021-09-01 DIAGNOSIS — M48061 Spinal stenosis, lumbar region without neurogenic claudication: Secondary | ICD-10-CM | POA: Diagnosis not present

## 2021-09-01 DIAGNOSIS — E559 Vitamin D deficiency, unspecified: Secondary | ICD-10-CM | POA: Insufficient documentation

## 2021-09-01 DIAGNOSIS — E538 Deficiency of other specified B group vitamins: Secondary | ICD-10-CM | POA: Diagnosis not present

## 2021-09-01 DIAGNOSIS — M4316 Spondylolisthesis, lumbar region: Secondary | ICD-10-CM | POA: Diagnosis not present

## 2021-09-01 DIAGNOSIS — K909 Intestinal malabsorption, unspecified: Secondary | ICD-10-CM | POA: Insufficient documentation

## 2021-09-01 DIAGNOSIS — M545 Low back pain, unspecified: Secondary | ICD-10-CM | POA: Diagnosis not present

## 2021-09-01 DIAGNOSIS — D508 Other iron deficiency anemias: Secondary | ICD-10-CM | POA: Diagnosis not present

## 2021-09-01 DIAGNOSIS — M4807 Spinal stenosis, lumbosacral region: Secondary | ICD-10-CM | POA: Diagnosis not present

## 2021-09-01 DIAGNOSIS — M5416 Radiculopathy, lumbar region: Secondary | ICD-10-CM | POA: Diagnosis not present

## 2021-09-01 LAB — CBC WITH DIFFERENTIAL/PLATELET
Abs Immature Granulocytes: 0.01 10*3/uL (ref 0.00–0.07)
Basophils Absolute: 0 10*3/uL (ref 0.0–0.1)
Basophils Relative: 1 %
Eosinophils Absolute: 0.2 10*3/uL (ref 0.0–0.5)
Eosinophils Relative: 4 %
HCT: 44 % (ref 36.0–46.0)
Hemoglobin: 14.8 g/dL (ref 12.0–15.0)
Immature Granulocytes: 0 %
Lymphocytes Relative: 38 %
Lymphs Abs: 1.7 10*3/uL (ref 0.7–4.0)
MCH: 31 pg (ref 26.0–34.0)
MCHC: 33.6 g/dL (ref 30.0–36.0)
MCV: 92.1 fL (ref 80.0–100.0)
Monocytes Absolute: 0.4 10*3/uL (ref 0.1–1.0)
Monocytes Relative: 9 %
Neutro Abs: 2.1 10*3/uL (ref 1.7–7.7)
Neutrophils Relative %: 48 %
Platelets: 149 10*3/uL — ABNORMAL LOW (ref 150–400)
RBC: 4.78 MIL/uL (ref 3.87–5.11)
RDW: 12.4 % (ref 11.5–15.5)
WBC: 4.5 10*3/uL (ref 4.0–10.5)
nRBC: 0 % (ref 0.0–0.2)

## 2021-09-01 LAB — BASIC METABOLIC PANEL
Anion gap: 6 (ref 5–15)
BUN: 16 mg/dL (ref 8–23)
CO2: 29 mmol/L (ref 22–32)
Calcium: 9 mg/dL (ref 8.9–10.3)
Chloride: 104 mmol/L (ref 98–111)
Creatinine, Ser: 0.81 mg/dL (ref 0.44–1.00)
GFR, Estimated: >60 mL/min (ref >60)
Glucose, Bld: 91 mg/dL (ref 70–99)
Potassium: 4 mmol/L (ref 3.5–5.1)
Sodium: 139 mmol/L (ref 135–145)

## 2021-09-01 LAB — VITAMIN D 25 HYDROXY (VIT D DEFICIENCY, FRACTURES): Vit D, 25-Hydroxy: 74.96 ng/mL (ref 30–100)

## 2021-09-04 ENCOUNTER — Inpatient Hospital Stay: Payer: Medicare HMO

## 2021-09-04 ENCOUNTER — Ambulatory Visit: Payer: Medicare HMO | Admitting: Internal Medicine

## 2021-09-04 ENCOUNTER — Inpatient Hospital Stay: Payer: Medicare HMO | Admitting: Nurse Practitioner

## 2021-09-04 ENCOUNTER — Other Ambulatory Visit: Payer: Self-pay

## 2021-09-04 VITALS — BP 114/78 | HR 78 | Temp 96.7°F | Resp 16 | Wt 204.0 lb

## 2021-09-04 DIAGNOSIS — E559 Vitamin D deficiency, unspecified: Secondary | ICD-10-CM | POA: Diagnosis not present

## 2021-09-04 DIAGNOSIS — K909 Intestinal malabsorption, unspecified: Secondary | ICD-10-CM | POA: Diagnosis not present

## 2021-09-04 DIAGNOSIS — E538 Deficiency of other specified B group vitamins: Secondary | ICD-10-CM

## 2021-09-04 DIAGNOSIS — D508 Other iron deficiency anemias: Secondary | ICD-10-CM

## 2021-09-04 DIAGNOSIS — R9389 Abnormal findings on diagnostic imaging of other specified body structures: Secondary | ICD-10-CM

## 2021-09-04 LAB — IRON AND TIBC
Iron: 102 ug/dL (ref 28–170)
Saturation Ratios: 23 % (ref 10.4–31.8)
TIBC: 449 ug/dL (ref 250–450)
UIBC: 347 ug/dL

## 2021-09-04 LAB — VITAMIN B12: Vitamin B-12: 553 pg/mL (ref 180–914)

## 2021-09-04 LAB — FERRITIN: Ferritin: 43 ng/mL (ref 11–307)

## 2021-09-04 MED ORDER — SODIUM CHLORIDE 0.9 % IV SOLN
Freq: Once | INTRAVENOUS | Status: AC
  Filled 2021-09-04: qty 250

## 2021-09-04 MED ORDER — SODIUM CHLORIDE 0.9 % IV SOLN
200.0000 mg | Freq: Once | INTRAVENOUS | Status: AC
  Administered 2021-09-04: 200 mg via INTRAVENOUS
  Filled 2021-09-04: qty 10

## 2021-09-04 MED ORDER — IRON SUCROSE 20 MG/ML IV SOLN: 200.0000 mg | Freq: Once | INTRAVENOUS | Status: DC

## 2021-09-04 NOTE — Progress Notes (Signed)
Returns for follow-up and possible iron infusion. No new concerns. States that fatigue continues.

## 2021-09-04 NOTE — Progress Notes (Signed)
[facility] NOTE  Patient Care Team: Olin Hauser, DO as PCP - General (Family Medicine)  CHIEF COMPLAINTS/PURPOSE OF CONSULTATION: Iron deficiency   HEMATOLOGY HISTORY  # ANEMIA IRON DEFICIENCY GASTRIC BYPASS [2005 [place], [place]] EGD-none; colonoscopy-2015- few polyps snared [[place]]; IV venofer x3; fall 2020/ PO B12 [intol to SL b12]  Latest Reference Range & Units 03/03/21 10:15  Iron 28 - 170 ug/dL 122  UIBC ug/dL 385  TIBC 250 - 450 ug/dL 507 (H)  Saturation Ratios 10.4 - 31.8 % 24  Ferritin 11 - 307 ng/mL 15  Vitamin B12 180 - 914 pg/mL 573  (H): Data is abnormally high  HISTORY OF PRESENTING ILLNESS:  Ashley Fields 66 y.o.  female history of iron deficiency secondary to gastric bypass is here for follow-up.  Complains of mild to moderate fatigue.  Otherwise no blood in stools or black or stools.   Review of Systems  Constitutional:  Positive for malaise/fatigue. Negative for chills, diaphoresis, fever and weight loss.  HENT:  Negative for nosebleeds and sore throat.   Eyes:  Negative for double vision.  Respiratory:  Negative for cough, hemoptysis, sputum production and wheezing.   Cardiovascular:  Negative for chest pain, palpitations, orthopnea and leg swelling.  Gastrointestinal:  Negative for abdominal pain, blood in stool, constipation, diarrhea, heartburn, melena, nausea and vomiting.  Genitourinary:  Negative for dysuria, frequency and urgency.  Musculoskeletal:  Negative for back pain and joint pain.  Skin: Negative.  Negative for itching and rash.  Neurological:  Negative for dizziness, tingling, focal weakness, weakness and headaches.  Endo/Heme/Allergies:  Does not bruise/bleed easily.  Psychiatric/Behavioral:  Negative for depression. The patient is not nervous/anxious and does not have insomnia.     MEDICAL HISTORY:  Past Medical History:  Diagnosis Date   Allergy    Anemia    Anxiety    Arthritis    Yes   Blood  transfusion without reported diagnosis 1977   Transfusions from miscarriage & hemorrhaging   Chronic kidney disease    Depression    Disp fx of cuboid bone of right foot, init for clos fx 01/01/2017   GERD (gastroesophageal reflux disease)    Headache    Hepatitis C    Hyperlipidemia    Neuromuscular disorder ([place]) 1997   Falling to much was an eye-opener   Osteoporosis    Thyroid disease    Urine incontinence     SURGICAL HISTORY: Past Surgical History:  Procedure Laterality Date   5 miscarriages     1977-1985, Blood transfusion s/p miscarriage [place]   Total   APPENDECTOMY  12/2008   AUGMENTATION MAMMAPLASTY Bilateral 2015   Bilat   AUGMENTATION MAMMAPLASTY Bilateral 2018   implants redone w/ placement of implants under muscle   breast lift bilateral, implants  71/24/5809   bilateral, silicon naturel   BREAST REDUCTION SURGERY Bilateral 1997   CESAREAN SECTION  07/27/1981   Placenta Previa   CHOLECYSTECTOMY  03/2004   Lap surgery   COLONOSCOPY WITH PROPOFOL N/A 06/13/2020   Procedure: COLONOSCOPY WITH PROPOFOL;  Surgeon: Virgel Manifold, MD;  Location: [facility] ENDOSCOPY;  Service: Endoscopy;  Laterality: N/A;   COSMETIC SURGERY     Breast implants w/ lift, tummy tuck, upper & lower Blepharop   ESOPHAGOGASTRODUODENOSCOPY (EGD) WITH PROPOFOL N/A 06/13/2020   Procedure: ESOPHAGOGASTRODUODENOSCOPY (EGD) WITH PROPOFOL;  Surgeon: Virgel Manifold, MD;  Location: [facility] ENDOSCOPY;  Service: Endoscopy;  Laterality: N/A;  EYE SURGERY  2017   Cataract surgery & lasik   GASTRIC BYPASS  2005   Laparoscopic   IVC FILTER INSERTION  12/2003   TrapEase Vena Cava Filter   LUMBAR LAMINECTOMY  06/2006   L4-L5 (spinal fusion)   mini tummy tuck  05/07/2016   Bilateral bra/back roll lift skin removal   neck surgery C3-C7  02/10/2015   ACDF   REDUCTION MAMMAPLASTY Bilateral 1997   SHOULDER ACROMIOPLASTY Left 03/27/2013   w/ labral debridement   SMALL  INTESTINE SURGERY  12/24/2003   Lap Gastric Bypass   SPINAL CORD STIMULATOR IMPLANT  11/2010   removed 05/2011   SPINE SURGERY  2008 2017   Fusion L4-L5, ACDF C4-C7    SOCIAL HISTORY: Social History   Socioeconomic History   Marital status: Married    Spouse name: Not on file   Number of children: 1   Years of education: High School   Highest education level: Some college, no degree  Occupational History   Occupation: disability  Tobacco Use   Smoking status: Former    Packs/day: 0.25    Years: 10.00    Total pack years: 2.50    Types: Cigarettes    Quit date: 11/29/1995    Years since quitting: 25.7   Smokeless tobacco: Former   Tobacco comments:    Quite 1997, didnt smoke hardly at all when I was smoking.  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]   Social Determinants of Health   Financial Resource Strain: [place]  (04/06/2021)   Overall Financial Resource Strain (CARDIA)    Difficulty of Paying Living Expenses: Not very hard  Food Insecurity: No Food Insecurity (04/06/2021)   Hunger Vital Sign    Worried About Running Out of Food in the Last Year: Never true    Ran Out of Food in the Last Year: Never true  Transportation Needs: No Transportation Needs (04/06/2021)   PRAPARE - Hydrologist (Medical): No    Lack of Transportation (Non-Medical): No  Physical Activity: Insufficiently Active (04/06/2021)   Exercise Vital Sign    Days of Exercise per Week: 2 days    Minutes of Exercise per Session: 10 min  Stress: Stress Concern Present (04/06/2021)   [place]    Feeling of Stress : Very much  Social Connections: Moderately Isolated (04/06/2021)   Social Connection and Isolation Panel [NHANES]    Frequency of Communication with Friends and Family: More than three times a week    Frequency of Social Gatherings with Friends and Family: More than three times a week    Attends Religious Services: Never    Marine scientist or Organizations: No    Attends Archivist Meetings: Never    Marital Status: Married  Human resources officer Violence: Not At Risk (04/06/2021)   Humiliation, Afraid, Rape, and Kick questionnaire    Fear of Current or Ex-Partner: No    Emotionally Abused: No    Physically Abused: No    Sexually Abused: No  FAMILY HISTORY: Family History  Problem Relation Age of Onset   COPD Mother    Lung cancer Mother    Diabetes Mother    Heart disease Mother    Stroke Mother    Heart attack Mother    Arthritis Mother    Cancer Mother    Hypertension Mother    Obesity Mother    Varicose Veins Mother    Heart disease Father    Heart attack Father 56   Early death Father    Depression Sister        x 46 sisters   COPD Sister    Hypertension Sister    Heart disease Brother        x 3 brothers   Diabetes Brother    Colon polyps Sister    Hearing loss Sister    Heart attack Sister    Heart attack Brother    Arthritis Sister    Varicose Veins Sister    Arthritis Brother    Heart disease Brother    Hypertension Brother    Cancer Maternal Grandfather    Stroke Maternal Grandfather    COPD Sister    Obesity Sister    Depression Sister    Depression Sister    Heart disease Sister    Hypertension Sister    Diabetes Brother    Heart disease Brother    Hypertension Brother    Obesity Brother    Varicose Veins Brother    Heart disease Brother    Hypertension Brother    Obesity Brother    Hypertension Sister    Obesity Sister    Miscarriages / Stillbirths Maternal Aunt     Miscarriages / Stillbirths Paternal Aunt    Breast cancer Neg Hx     ALLERGIES:  is allergic to flagyl [metronidazole], cymbalta [duloxetine hcl], and tape.  MEDICATIONS:  Current Outpatient Medications  Medication Sig Dispense Refill   oxyCODONE-acetaminophen (PERCOCET) 10-325 MG tablet Take 1 tablet by mouth 4 (four) times daily.     ASPIRIN LOW DOSE 81 MG tablet TAKE 1 TABLET BY MOUTH EVERY DAY 90 tablet 0   butalbital-acetaminophen-caffeine (FIORICET) 50-325-40 MG tablet Take 1 tablet by mouth 2 (two) times daily as needed for headache.     Cholecalciferol 250 MCG (10000 UT) CAPS Take by mouth.     cyanocobalamin (,VITAMIN B-12,) 1000 MCG/ML injection Inject 1,000 mcg into the muscle every 30 (thirty) days.     diclofenac Sodium (VOLTAREN) 1 % GEL Apply 2 g topically 4 (four) times daily.     estradiol (ESTRACE) 1 MG tablet TAKE 1 TABLET EVERY DAY 90 tablet 1   etodolac (LODINE) 500 MG tablet TAKE 1 TABLET BY MOUTH TWICE A DAY 180 tablet 1   ezetimibe (ZETIA) 10 MG tablet Take 1 tablet (10 mg total) by mouth daily. 90 tablet 1   fluticasone (FLONASE) 50 MCG/ACT nasal spray SPRAY 2 SPRAYS INTO EACH NOSTRIL EVERY DAY 48 mL 2   furosemide (LASIX) 20 MG tablet Take 1 tablet (20 mg total) by mouth daily as needed for fluid or edema. 90 tablet 1   gabapentin (NEURONTIN) 600 MG tablet Take 1 tablet (600 mg total) by mouth in the morning, at noon, in the evening, and at bedtime. 360 tablet 1   hydrOXYzine (ATARAX/VISTARIL) 25 MG tablet TAKE 1/2 TO 1 TABLET BY MOUTH TWICE A DAY AS NEEDED FOR SEVERE PANIC ATTACKS/ ANXIETY 180 tablet 1   mometasone (ELOCON) 0.1 % cream Apply 1 application topically  2 (two) times daily.     omeprazole (PRILOSEC) 40 MG capsule TAKE 1 CAPSULE BY MOUTH EVERY DAY 90 capsule 1   ondansetron (ZOFRAN ODT) 4 MG disintegrating tablet Take 1 tablet (4 mg total) by mouth every 8 (eight) hours as needed for nausea or vomiting. 30 tablet 2   oxyCODONE (OXY IR/ROXICODONE) 5 MG  immediate release tablet Take 10 mg by mouth every 6 (six) hours as needed for severe pain.     oxyCODONE-acetaminophen (PERCOCET) 7.5-325 MG tablet Take 1 tablet by mouth every 6 (six) hours as needed. (Patient not taking: Reported on 07/28/2021)     propranolol (INDERAL) 10 MG tablet TAKE 1 TABLET THREE TIMES DAILY AS NEEDED FOR SEVERE ANXIETY ATTACKS (LIMITED SUPPLY) 90 tablet 0   rizatriptan (MAXALT-MLT) 10 MG disintegrating tablet Take 1 tablet (10 mg total) by mouth as needed for migraine. May repeat in 2 hours if needed, that is max dose for 24 hours. 10 tablet 3   rosuvastatin (CRESTOR) 40 MG tablet TAKE 1 TABLET EVERY DAY 90 tablet 1   SHINGRIX injection      tiZANidine (ZANAFLEX) 4 MG tablet Take 4 mg by mouth 4 (four) times daily.     traZODone (DESYREL) 100 MG tablet TAKE ONE TABLET BY MOUTH AT BEDTIME FOR SLEEP 90 tablet 0   venlafaxine XR (EFFEXOR-XR) 75 MG 24 hr capsule TAKE ONE CAPSULE BY MOUTH DAILY WITH BREAKFAST 90 capsule 1   Zoledronic Acid (ZOMETA) 4 MG/100ML IVPB Once per year from Orthopedic 100 mL    No current facility-administered medications for this visit.      PHYSICAL EXAMINATION:   Vitals:   09/04/21 1311  BP: 114/78  Pulse: 78  Resp: 16  Temp: (!) 96.7 F (35.9 C)  SpO2: 93%   Filed Weights   09/04/21 1311  Weight: 204 lb (92.5 kg)    Physical Exam HENT:     Head: Normocephalic and atraumatic.     Mouth/Throat:     Pharynx: No oropharyngeal exudate.  Eyes:     Pupils: Pupils are equal, round, and reactive to light.  Cardiovascular:     Rate and Rhythm: Normal rate and regular rhythm.  Pulmonary:     Effort: Pulmonary effort is normal. No respiratory distress.     Breath sounds: Normal breath sounds. No wheezing.  Abdominal:     General: Bowel sounds are normal. There is no distension.     Palpations: Abdomen is soft. There is no mass.     Tenderness: There is no abdominal tenderness. There is no guarding or rebound.  Musculoskeletal:         General: No tenderness. Normal range of motion.     Cervical back: Normal range of motion and neck supple.  Skin:    General: Skin is warm.  Neurological:     Mental Status: She is alert and oriented to person, place, and time.  Psychiatric:        Mood and Affect: Affect normal.     LABORATORY DATA:  I have reviewed the data as listed Lab Results  Component Value Date   WBC 4.5 09/01/2021   HGB 14.8 09/01/2021   HCT 44.0 09/01/2021   MCV 92.1 09/01/2021   PLT 149 (L) 09/01/2021   Recent Labs    03/11/21 2004 04/28/21 0824 09/01/21 0813  NA 138 142 139  K 4.0 4.3 4.0  CL 106 104 104  CO2 '25 30 29  '$ GLUCOSE 108* 82 91  BUN  $'18 19 16  'X$ CREATININE 0.92 1.07* 0.81  CALCIUM 8.9 9.1 9.0  GFRNONAA >60  --  >60  PROT  --  6.4  --   AST  --  31  --   ALT  --  25  --   BILITOT  --  0.8  --    Iron/TIBC/Ferritin/ %Sat    Component Value Date/Time   IRON 66 04/28/2021 0824   TIBC 365 04/28/2021 0824   FERRITIN 64 04/28/2021 0824   IRONPCTSAT 18 04/28/2021 0824     CT LUMBAR SPINE WO CONTRAST  Result Date: 09/01/2021 CLINICAL DATA:  2 low back pain radiating down right leg for 10 months. History of fusion surgery. EXAM: CT LUMBAR SPINE WITHOUT CONTRAST TECHNIQUE: Multidetector CT imaging of the lumbar spine was performed without intravenous contrast administration. Multiplanar CT image reconstructions were also generated. RADIATION DOSE REDUCTION: This exam was performed according to the departmental dose-optimization program which includes automated exposure control, adjustment of the mA and/or kV according to patient size and/or use of iterative reconstruction technique. COMPARISON:  Lumbar spine MRI 08/15/2021 FINDINGS: Segmentation: Standard; the lowest formed disc space is designated L5-S1. Alignment: Grade 1 retrolisthesis of L2 on L3 and grade 1 anterolisthesis of L3 on L4 and L4 on L5 are unchanged since the recent MRI. There is dextrocurvature centered at L3 which is  similar to the MRI from January 2023. Vertebrae: Vertebral body heights are preserved. There is no suspicious osseous lesion. Postsurgical changes reflecting posterior instrumented fusion with bilateral pedicle screws at L3-L4 as well as posterior decompression at L4-L5 and right laminectomy at L5-S1 are again seen. Ghost tracks from prior bilateral pedicle screws at L5 are noted. There is no convincing osseous fusion across the intervening disc spaces. There is fusion of the posterior elements at least partially bilaterally at L3-L4 and on the right at L4-L5. A left-sided pars defect is seen at L5-S1. Paraspinal and other soft tissues: An IVC filter is noted. The paraspinal soft tissues are unremarkable. Disc levels: There is disc space narrowing at L2-L3 and L5-S1 with vacuum disc phenomenon at L5-S1 the other disc heights are overall preserved. There is multilevel facet arthropathy most advanced at L3-L4 and L4-L5. Multilevel spinal canal and neural foraminal stenosis was described in detail on the recent MRI report from 08/15/2021. Briefly, there is moderate to severe spinal canal stenosis and moderate left neural foraminal stenosis at L2-L3, and moderate left and severe right neural foraminal stenosis at L5-S1. IMPRESSION: 1. Postsurgical changes reflecting posterior instrumented fusion at L3-L4 without evidence of complication and ghost tracks from prior pedicle screws at L5. There is no convincing osseous across the disc spaces at these levels but there is at least partial fusion of the posterior elements bilaterally at L3-L4 and on the right at L4-L5. 2. 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 Up to moderate to severe spinal canal stenosis and moderate left neural foraminal stenosis at L2-L3 and moderate left and severe right neural foraminal stenosis at L5-S1. 3. Vacuum disc phenomenon at L5-S1. Electronically Signed   By: Valetta Mole M.D.    On: 09/01/2021 16:12    No problem-specific Assessment & Plan notes found for this encounter.  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 On IV iron. Last venofer 03/06/21 x 1. Hemoglobin is stable at 14.8, no microcytosis. Iron studies and ferritin were not drawn  at lab appointment on 8/25. Patient agrees to have drawn today. Given that she is symptomatic and her malabsorption we will proceed with venofer today. Additional infusions will be based on her results.    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 Add on labs today.    # Vitamin D deficiency- malabsorption;on vitamin D 5000 units; check Vit D levels. Goal > 50. Today, 74.96. Continue daily vitamin d 1000 u.    # Mild intermittent thrombocytopenia-178- normal.   DISPOSITION:  # Labs today (ferritin, iron studies, b12). Venofer today # 6 mo- labs (cbc, bmp, ferritin, iron studies, b12, vitamin d) # Few days later- Dr. Rogue Bussing, +/- venofer- la  All questions were answered. The patient knows to call the clinic with any problems, questions or concerns.  Verlon Au, NP 09/04/2021

## 2021-09-04 NOTE — Patient Instructions (Signed)
[facility]  Discharge Instructions: Thank you for choosing [facility] to provide your oncology and hematology care.  If you have a lab appointment with the Cancer Center, please go directly to the Cancer Center and check in at the registration area.  Wear comfortable clothing and clothing appropriate for easy access to any Portacath or PICC line.   We strive to give you quality time with your provider. You may need to reschedule your appointment if you arrive late (15 or more minutes).  Arriving late affects you and other patients whose appointments are after yours.  Also, if you miss three or more appointments without notifying the office, you may be dismissed from the clinic at the provider's discretion.      For prescription refill requests, have your pharmacy contact our office and allow 72 hours for refills to be completed.    Today you received the following chemotherapy and/or immunotherapy agents VENOFER      To help prevent nausea and vomiting after your treatment, we encourage you to take your nausea medication as directed.  BELOW ARE SYMPTOMS THAT SHOULD BE REPORTED IMMEDIATELY: *FEVER GREATER THAN 100.4 F (38 C) OR HIGHER *CHILLS OR SWEATING *NAUSEA AND VOMITING THAT IS NOT CONTROLLED WITH YOUR NAUSEA MEDICATION *UNUSUAL SHORTNESS OF BREATH *UNUSUAL BRUISING OR BLEEDING *URINARY PROBLEMS (pain or burning when urinating, or frequent urination) *BOWEL PROBLEMS (unusual diarrhea, constipation, pain near the anus) TENDERNESS IN MOUTH AND THROAT WITH OR WITHOUT PRESENCE OF ULCERS (sore throat, sores in mouth, or a toothache) UNUSUAL RASH, SWELLING OR PAIN  UNUSUAL VAGINAL DISCHARGE OR ITCHING   Items with * indicate a potential emergency and should be followed up as soon as possible or go to the Emergency Department if any problems should occur.  Please show the CHEMOTHERAPY ALERT CARD or IMMUNOTHERAPY ALERT CARD at check-in to the  Emergency Department and triage nurse.  Should you have questions after your visit or need to cancel or reschedule your appointment, please contact [facility]  [phone] and follow the prompts.  Office hours are 8:00 a.m. to 4:30 p.m. Monday - Friday. Please note that voicemails left after 4:00 p.m. may not be returned until the following business day.  We are closed weekends and major holidays. You have access to a nurse at all times for urgent questions. Please call the main number to the clinic [phone] and follow the prompts.  For any non-urgent questions, you may also contact your provider using MyChart. We now offer e-Visits for anyone 18 and older to request care online for non-urgent symptoms. For details visit [email or web address].   Also download the MyChart app! Go to the app store, search "MyChart", open the app, select [facility], and log in with your MyChart username and password.  Masks are optional in the cancer centers. If you would like for your care team to wear a mask while they are taking care of you, please let them know. For doctor visits, patients may have with them one support person who is at least 65 years old. At this time, visitors are not allowed in the infusion area.  Iron Sucrose Injection What is this medication? IRON SUCROSE (EYE ern SOO krose) treats low levels of iron (iron deficiency anemia) in people with kidney disease. Iron is a mineral that plays an important role in making red blood cells, which carry oxygen from your lungs to the rest of your body. This medicine may be   used for other purposes; ask your health care provider or pharmacist if you have questions. COMMON BRAND NAME(S): Venofer What should I tell my care team before I take this medication? They need to know if you have any of these conditions: Anemia not caused by low iron levels Heart disease High levels of iron in the blood Kidney disease Liver  disease An unusual or allergic reaction to iron, other medications, foods, dyes, or preservatives Pregnant or trying to get pregnant Breast-feeding How should I use this medication? This medication is for infusion into a vein. It is given in a hospital or clinic setting. Talk to your care team about the use of this medication in children. While this medication may be prescribed for children as young as 2 years for selected conditions, precautions do apply. Overdosage: If you think you have taken too much of this medicine contact a poison control center or emergency room at once. NOTE: This medicine is only for you. Do not share this medicine with others. What if I miss a dose? It is important not to miss your dose. Call your care team if you are unable to keep an appointment. What may interact with this medication? Do not take this medication with any of the following: Deferoxamine Dimercaprol Other iron products This medication may also interact with the following: Chloramphenicol Deferasirox This list may not describe all possible interactions. Give your health care provider a list of all the medicines, herbs, non-prescription drugs, or dietary supplements you use. Also tell them if you smoke, drink alcohol, or use illegal drugs. Some items may interact with your medicine. What should I watch for while using this medication? Visit your care team regularly. Tell your care team if your symptoms do not start to get better or if they get worse. You may need blood work done while you are taking this medication. You may need to follow a special diet. Talk to your care team. Foods that contain iron include: whole grains/cereals, dried fruits, beans, or peas, leafy green vegetables, and organ meats (liver, kidney). What side effects may I notice from receiving this medication? Side effects that you should report to your care team as soon as possible: Allergic reactions--skin rash, itching, hives,  swelling of the face, lips, tongue, or throat Low blood pressure--dizziness, feeling faint or lightheaded, blurry vision Shortness of breath Side effects that usually do not require medical attention (report to your care team if they continue or are bothersome): Flushing Headache Joint pain Muscle pain Nausea Pain, redness, or irritation at injection site This list may not describe all possible side effects. Call your doctor for medical advice about side effects. You may report side effects to FDA at 1-800-FDA-1088. Where should I keep my medication? This medication is given in a hospital or clinic and will not be stored at home. NOTE: This sheet is a summary. It may not cover all possible information. If you have questions about this medicine, talk to your doctor, pharmacist, or health care provider.  2023 Elsevier/Gold Standard (2007-02-15 00:00:00)   

## 2021-09-05 ENCOUNTER — Ambulatory Visit
Admission: RE | Admit: 2021-09-05 | Discharge: 2021-09-05 | Disposition: A | Discharge status: Home / Self Care | Payer: Medicare HMO | Source: Ambulatory Visit | Attending: Family Medicine | Admitting: Family Medicine

## 2021-09-05 ENCOUNTER — Other Ambulatory Visit: Payer: Self-pay

## 2021-09-05 DIAGNOSIS — Z1231 Encounter for screening mammogram for malignant neoplasm of breast: Secondary | ICD-10-CM | POA: Insufficient documentation

## 2021-09-05 DIAGNOSIS — D508 Other iron deficiency anemias: Secondary | ICD-10-CM

## 2021-09-05 DIAGNOSIS — E538 Deficiency of other specified B group vitamins: Secondary | ICD-10-CM

## 2021-09-12 ENCOUNTER — Ambulatory Visit (INDEPENDENT_AMBULATORY_CARE_PROVIDER_SITE_OTHER): Payer: Medicare HMO | Admitting: Family Medicine

## 2021-09-12 ENCOUNTER — Encounter: Payer: Self-pay | Admitting: Family Medicine

## 2021-09-12 ENCOUNTER — Ambulatory Visit
Admission: RE | Admit: 2021-09-12 | Discharge: 2021-09-12 | Disposition: A | Discharge status: Home / Self Care | Payer: Medicare HMO | Source: Home / Self Care | Attending: Family Medicine | Admitting: Family Medicine

## 2021-09-12 ENCOUNTER — Ambulatory Visit
Admission: RE | Admit: 2021-09-12 | Discharge: 2021-09-12 | Disposition: A | Discharge status: Home / Self Care | Payer: Medicare HMO | Source: Ambulatory Visit | Attending: Family Medicine | Admitting: Family Medicine

## 2021-09-12 VITALS — BP 133/73 | HR 94 | Ht 62.0 in | Wt 205.2 lb

## 2021-09-12 DIAGNOSIS — R918 Other nonspecific abnormal finding of lung field: Secondary | ICD-10-CM | POA: Diagnosis not present

## 2021-09-12 DIAGNOSIS — R9389 Abnormal findings on diagnostic imaging of other specified body structures: Secondary | ICD-10-CM | POA: Diagnosis not present

## 2021-09-12 DIAGNOSIS — M47814 Spondylosis without myelopathy or radiculopathy, thoracic region: Secondary | ICD-10-CM | POA: Diagnosis not present

## 2021-09-12 DIAGNOSIS — H6692 Otitis media, unspecified, left ear: Secondary | ICD-10-CM

## 2021-09-12 DIAGNOSIS — H9190 Unspecified hearing loss, unspecified ear: Secondary | ICD-10-CM

## 2021-09-12 NOTE — Patient Instructions (Addendum)
Thank you for coming to the office today.  Recommend discussing the C-spine / Neck symptoms with headaches with your Neurosurgery team in [place], Dr Ronnald Ramp or colleague and see if they can assist further.  Referral to ENT. Stay tuned for apt with ENT then will need hearing evaluation.  [facility] [address] [place], [place] [address] Phone: [phone]  Likely Viral Gastroenteritis - will need to run it's course for right now. Keep on Zofran as needed.  Feel free to take the Rizatriptan more often if you are interested  Please schedule a Follow-up Appointment to: Return if symptoms worsen or fail to improve.  If you have any other questions or concerns, please feel free to call the office or send a message through [facility]. You may also schedule an earlier appointment if necessary.  Additionally, you may be receiving a survey about your experience at our office within a few days to 1 week by e-mail or mail. We value your feedback.  Nobie Putnam, DO [place]

## 2021-09-12 NOTE — Progress Notes (Unsigned)
Subjective:    Patient ID: Ashley Fields, female    DOB: Aug 26, 1955, 66 y.o.   MRN: [id number]  Ashley Fields is a 66 y.o. female presenting on 09/12/2021 for Ear Pain   HPI  Cervical Spine / Neck Pain Recurrent Chronic Headaches, cervicogenic Episodic Migraines  History of prior Neck Surgery before years ago without problems until procedure in May 2023. She has seen Dr Ronnald Ramp - [facility] Has upcoming injections for Lumbar spine and further repair if indicated.  For migraines, she has taken Fioricet PRN and has Rizatriptan ODT PRN. Rarely taking.  Left Ear Pain Treated on a Virtual Video visit for this and treated with Augmentin and Ofloxacin antibiotic ear drops. Overall improved. Still has some fluttery feeling once in a while. ***  Viral Gastro ***Nausea vomiting No fevers chills Denies diarrhea      09/12/2021    2:13 PM 07/28/2021   10:15 AM 07/13/2021    9:01 AM  Depression screen PHQ 2/9  Decreased Interest 1  1  Down, Depressed, Hopeless 0  1  PHQ - 2 Score 1  2  Altered sleeping 0  1  Tired, decreased energy 1  1  Change in appetite 1  1  Feeling bad or failure about yourself  0  0  Trouble concentrating 0  0  Moving slowly or fidgety/restless 0  0  Suicidal thoughts 0  0  PHQ-9 Score 3  5  Difficult doing work/chores Not difficult at all       Information is confidential and restricted. Go to Review Flowsheets to unlock data.    Social History   Tobacco Use   Smoking status: Former    Packs/day: 0.25    Years: 10.00    Total pack years: 2.50    Types: Cigarettes    Quit date: 11/29/1995    Years since quitting: 25.8   Smokeless tobacco: Former   Tobacco comments:    Quite 1997, didnt smoke hardly at all when I was smoking.  Vaping Use   Vaping Use: Never used  Substance Use Topics   Alcohol use: No   Drug use: No    Review of Systems Per HPI unless specifically indicated above     Objective:    BP 133/73    Pulse 94   Ht '5\' 2"'$  (1.575 m)   Wt 205 lb 3.2 oz (93.1 kg)   SpO2 96%   BMI 37.53 kg/m   Wt Readings from Last 3 Encounters:  09/12/21 205 lb 3.2 oz (93.1 kg)  09/04/21 204 lb (92.5 kg)  07/13/21 205 lb (93 kg)    Physical Exam Results for orders placed or performed in visit on 09/04/21  Vitamin B12  Result Value Ref Range   Vitamin B-12 553 180 - 914 pg/mL  Ferritin  Result Value Ref Range   Ferritin 43 11 - 307 ng/mL      Assessment & Plan:   Problem List Items Addressed This Visit   None Visit Diagnoses     Hearing loss, unspecified hearing loss type, unspecified laterality    -  Primary   Relevant Orders   Ambulatory referral to ENT   Otitis media, recurrent, left       Relevant Orders   Ambulatory referral to ENT       Orders Placed This Encounter  Procedures   Ambulatory referral to ENT    Referral Priority:   Routine    Referral Type:  Consultation    Referral Reason:   Specialty Services Required    Requested Specialty:   Otolaryngology    Number of Visits Requested:   1     No orders of the defined types were placed in this encounter.   Follow up plan: Return if symptoms worsen or fail to improve.   Ashley Putnam, DO [facility] 09/12/2021, 2:22 PM

## 2021-09-14 DIAGNOSIS — M5416 Radiculopathy, lumbar region: Secondary | ICD-10-CM | POA: Diagnosis not present

## 2021-09-18 ENCOUNTER — Other Ambulatory Visit: Payer: Self-pay | Admitting: Family Medicine

## 2021-09-18 DIAGNOSIS — G8929 Other chronic pain: Secondary | ICD-10-CM

## 2021-09-18 MED ORDER — TIZANIDINE HCL 4 MG PO TABS: 4.0000 mg | ORAL_TABLET | Freq: Four times a day (QID) | ORAL | 1 refills | Status: DC

## 2021-09-20 ENCOUNTER — Ambulatory Visit: Payer: Medicare HMO | Admitting: Family Medicine

## 2021-09-27 DIAGNOSIS — M5416 Radiculopathy, lumbar region: Secondary | ICD-10-CM | POA: Diagnosis not present

## 2021-09-27 DIAGNOSIS — M533 Sacrococcygeal disorders, not elsewhere classified: Secondary | ICD-10-CM | POA: Diagnosis not present

## 2021-09-27 DIAGNOSIS — M48061 Spinal stenosis, lumbar region without neurogenic claudication: Secondary | ICD-10-CM | POA: Diagnosis not present

## 2021-09-27 DIAGNOSIS — M25552 Pain in left hip: Secondary | ICD-10-CM | POA: Diagnosis not present

## 2021-09-27 DIAGNOSIS — G894 Chronic pain syndrome: Secondary | ICD-10-CM | POA: Diagnosis not present

## 2021-09-27 DIAGNOSIS — M545 Low back pain, unspecified: Secondary | ICD-10-CM | POA: Diagnosis not present

## 2021-09-27 DIAGNOSIS — Z79899 Other long term (current) drug therapy: Secondary | ICD-10-CM | POA: Diagnosis not present

## 2021-09-27 DIAGNOSIS — M47896 Other spondylosis, lumbar region: Secondary | ICD-10-CM | POA: Diagnosis not present

## 2021-09-27 DIAGNOSIS — M479 Spondylosis, unspecified: Secondary | ICD-10-CM | POA: Diagnosis not present

## 2021-10-08 ENCOUNTER — Encounter: Payer: Self-pay | Admitting: Family Medicine

## 2021-10-08 DIAGNOSIS — E538 Deficiency of other specified B group vitamins: Secondary | ICD-10-CM

## 2021-10-09 MED ORDER — CYANOCOBALAMIN 1000 MCG/ML IJ SOLN: 1000.0000 ug | INTRAMUSCULAR | 11 refills | Status: DC

## 2021-10-19 DIAGNOSIS — M5416 Radiculopathy, lumbar region: Secondary | ICD-10-CM | POA: Diagnosis not present

## 2021-10-19 DIAGNOSIS — Z6836 Body mass index (BMI) 36.0-36.9, adult: Secondary | ICD-10-CM | POA: Diagnosis not present

## 2021-10-21 ENCOUNTER — Other Ambulatory Visit: Payer: Self-pay | Admitting: Family Medicine

## 2021-10-21 DIAGNOSIS — J3089 Other allergic rhinitis: Secondary | ICD-10-CM

## 2021-10-23 NOTE — Telephone Encounter (Signed)
Requested Prescriptions  Pending Prescriptions Disp Refills  . fluticasone (FLONASE) 50 MCG/ACT nasal spray [Pharmacy Med Name: FLUTICASONE PROP 50 MCG SPRAY] 48 mL 0    Sig: SPRAY 2 SPRAYS INTO EACH NOSTRIL EVERY DAY     Ear, Nose, and Throat: Nasal Preparations - Corticosteroids Passed - 10/21/2021  9:11 AM      Passed - Valid encounter within last 12 months    Recent Outpatient Visits          1 month ago Hearing loss, unspecified hearing loss type, unspecified laterality   [place], DO   3 months ago Spinal stenosis of lumbar region without neurogenic claudication   [place], DO   5 months ago Annual physical exam   [place], DO   7 months ago Migraine without aura and with status migrainosus, not intractable   [place], DO   7 months ago Acute non-recurrent frontal sinusitis   [place], [place]

## 2021-10-24 ENCOUNTER — Encounter: Payer: Self-pay | Admitting: Psychiatry

## 2021-10-24 ENCOUNTER — Telehealth (INDEPENDENT_AMBULATORY_CARE_PROVIDER_SITE_OTHER): Payer: Medicare HMO | Admitting: Psychiatry

## 2021-10-24 DIAGNOSIS — F410 Panic disorder [episodic paroxysmal anxiety] without agoraphobia: Secondary | ICD-10-CM | POA: Diagnosis not present

## 2021-10-24 DIAGNOSIS — F3178 Bipolar disorder, in full remission, most recent episode mixed: Secondary | ICD-10-CM | POA: Diagnosis not present

## 2021-10-24 DIAGNOSIS — F4310 Post-traumatic stress disorder, unspecified: Secondary | ICD-10-CM

## 2021-10-24 DIAGNOSIS — G4701 Insomnia due to medical condition: Secondary | ICD-10-CM | POA: Diagnosis not present

## 2021-10-24 MED ORDER — PROPRANOLOL HCL 10 MG PO TABS: 10.0000 mg | ORAL_TABLET | Freq: Three times a day (TID) | ORAL | 0 refills | Status: DC | PRN

## 2021-10-24 NOTE — Progress Notes (Unsigned)
Virtual Visit via Video Note  I connected with Ashley Fields on 10/24/21 at 11:20 AM EDT by a video enabled telemedicine application and verified that I am speaking with the correct person using two identifiers.  Location Provider Location : [facility] Patient Location : Home  Participants: Patient , Provider    I discussed the limitations of evaluation and management by telemedicine and the availability of in person appointments. The patient expressed understanding and agreed to proceed.  I discussed the assessment and treatment plan with the patient. The patient was provided an opportunity to ask questions and all were answered. The patient agreed with the plan and demonstrated an understanding of the instructions.   The patient was advised to call back or seek an in-person evaluation if the symptoms worsen or if the condition fails to improve as anticipated.   [place] MD OP Progress Note  10/25/2021 12:14 PM Ashley Fields  MRN:  [id number]  Chief Complaint:  Chief Complaint  Patient presents with   Follow-up   Anxiety   Depression   HPI: Ashley Fields is a 66 year old Caucasian female, married, on disability, lives in [facility], has a history of bipolar disorder, PTSD, chronic pain, migraine headaches, history of laparoscopic gastric bypass was evaluated by telemedicine today.  Patient today reports she is currently struggling with chronic back pain.  She reports she had a second opinion with the pain provider in [place].  Patient reports she has been scheduled for surgery, likely will be in December of this year.  Reports pain continues to limit her ability to function during the day.  Currently taking muscle relaxants as well as pain medication-oxycodone-acetaminophen combination.  Patient reports she is also on gabapentin for the pain which also helps with her mood.  Patient is aware of CNS depressant effect of these kind of medications.  She also has fatigue during the day.  Does report  feeling irritable when she is in a lot of pain.  Although anxious about her health, has been coping okay.  Husband has been supportive.  Reports sleep as restless on and off due to pain.  However when pain is under control she is able to sleep okay.  Denies any depression symptoms.  Reports she is trying to lose weight and watching her diet.  Appetite is fair.  Denies any side effects to her medications like venlafaxine, trazodone.  She is also taking propranolol as needed, limiting use.  Denies any suicidality, homicidality or perceptual disturbances.  Patient does report short-term memory problems and reports she is planning to start making a list to keep up.  Will keep monitoring her memory issues, will consider reevaluating in future Fields.  Denies any other concerns today.  Visit Diagnosis:    ICD-10-CM   1. Bipolar disorder, in full remission, most recent episode mixed ([place])  F31.78    mild    2. PTSD (post-traumatic stress disorder)  F43.10 propranolol (INDERAL) 10 MG tablet    3. Panic disorder  F41.0     4. 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  Past trials of Prozac, Wellbutrin, Lexapro, Cymbalta, Rexulti, Ambien-nightmares, Lunesta.  Past Medical History:  Past Medical History:  Diagnosis Date   Allergy    Anemia    Anxiety    Arthritis    Yes   Blood transfusion without reported diagnosis 1977   Transfusions from miscarriage &  hemorrhaging   Chronic kidney disease    Depression    Disp fx of cuboid bone of right foot, init for clos fx 01/01/2017   GERD (gastroesophageal reflux disease)    Headache    Hepatitis C    Hyperlipidemia    Neuromuscular disorder ([place]) 1997   Falling to much was an eye-opener   Osteoporosis    Thyroid disease    Urine incontinence     Past Surgical History:  Procedure Laterality Date   5 miscarriages      1977-1985, Blood transfusion s/p miscarriage 1977   ABDOMINAL HYSTERECTOMY  1987   Total   APPENDECTOMY  12/2008   AUGMENTATION MAMMAPLASTY Bilateral 2015   Bilat   AUGMENTATION MAMMAPLASTY Bilateral 2018   implants redone w/ placement of implants under muscle   breast lift bilateral, implants  14/48/1856   bilateral, silicon naturel   BREAST REDUCTION SURGERY Bilateral 1997   CESAREAN SECTION  07/27/1981   Placenta Previa   CHOLECYSTECTOMY  03/2004   Lap surgery   COLONOSCOPY WITH PROPOFOL N/A 06/13/2020   Procedure: COLONOSCOPY WITH PROPOFOL;  Surgeon: Virgel Manifold, MD;  Location: [facility] ENDOSCOPY;  Service: Endoscopy;  Laterality: N/A;   COSMETIC SURGERY     Breast implants w/ lift, tummy tuck, upper & lower Blepharop   ESOPHAGOGASTRODUODENOSCOPY (EGD) WITH PROPOFOL N/A 06/13/2020   Procedure: ESOPHAGOGASTRODUODENOSCOPY (EGD) WITH PROPOFOL;  Surgeon: Virgel Manifold, MD;  Location: [facility] ENDOSCOPY;  Service: Endoscopy;  Laterality: N/A;   EYE SURGERY  2017   Cataract surgery & lasik   GASTRIC BYPASS  2005   Laparoscopic   IVC FILTER INSERTION  12/2003   TrapEase Vena Cava Filter   LUMBAR LAMINECTOMY  06/2006   L4-L5 (spinal fusion)   mini tummy tuck  05/07/2016   Bilateral bra/back roll lift skin removal   neck surgery C3-C7  02/10/2015   ACDF   REDUCTION MAMMAPLASTY Bilateral 1997   SHOULDER ACROMIOPLASTY Left 03/27/2013   w/ labral debridement   SMALL INTESTINE SURGERY  12/24/2003   Lap Gastric Bypass   SPINAL CORD STIMULATOR IMPLANT  11/2010   removed 05/2011   SPINE SURGERY  2008 2017   Fusion L4-L5, ACDF C4-C7    Family Psychiatric History: Reviewed family psychiatric history from progress note on 05/15/2018.  Family History:  Family History  Problem Relation Age of Onset   COPD Mother    Lung cancer Mother    Diabetes Mother    Heart disease Mother    Stroke Mother    Heart attack Mother    Arthritis Mother    Cancer Mother    Hypertension Mother     Obesity Mother    Varicose Veins Mother    Heart disease Father    Heart attack Father 72   Early death Father    Depression Sister        x 5 sisters   COPD Sister    Hypertension Sister    Heart disease Brother        x 3 brothers   Diabetes Brother    Colon polyps Sister    Hearing loss Sister    Heart attack Sister    Heart attack Brother    Arthritis Sister    Varicose Veins Sister    Arthritis Brother    Heart disease Brother    Hypertension Brother    Cancer Maternal Grandfather    Stroke Maternal Grandfather    COPD Sister    Obesity Sister  Depression Sister    Depression Sister    Heart disease Sister    Hypertension Sister    Diabetes Brother    Heart disease Brother    Hypertension Brother    Obesity Brother    Varicose Veins Brother    Heart disease Brother    Hypertension Brother    Obesity Brother    Hypertension Sister    Obesity Sister    Miscarriages / Stillbirths Maternal Aunt    Miscarriages / Stillbirths Paternal Aunt    Breast cancer Neg Hx     Social History: Reviewed social history from progress note on 05/15/2018. Social History   Socioeconomic History   Marital status: Married    Spouse name: Not on file   Number of children: 1   Years of education: High School   Highest education level: Some college, no degree  Occupational History   Occupation: disability  Tobacco Use   Smoking status: Former    Packs/day: 0.25    Years: 10.00    Total pack years: 2.50    Types: Cigarettes    Quit date: 11/29/1995    Years since quitting: 25.9   Smokeless tobacco: Former   Tobacco comments:    Quite 1997, didnt smoke hardly at all when I was smoking.  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]   Social Determinants of Health   Financial Resource Strain: [place]  (04/06/2021)   Overall Financial Resource Strain (CARDIA)    Difficulty of Paying Living Expenses: Not very hard  Food Insecurity: No Food Insecurity (04/06/2021)   Hunger Vital Sign    Worried About Running Out of Food in the Last Year: Never true    Ran Out of Food in the Last Year: Never true  Transportation Needs: No Transportation Needs (04/06/2021)   PRAPARE - Hydrologist (Medical): No    Lack of Transportation (Non-Medical): No  Physical Activity: Insufficiently Active (04/06/2021)   Exercise Vital Sign    Days of Exercise per Week: 2 days    Minutes of Exercise per Session: 10 min  Stress: Stress Concern Present (04/06/2021)   [place]    Feeling of Stress : Very much  Social Connections: Moderately Isolated (04/06/2021)   Social Connection and Isolation Panel [NHANES]    Frequency of Communication with Friends and Family: More than three times a week    Frequency of Social Gatherings with Friends and Family: More than three times a week    Attends Religious Services: Never    Marine scientist or Organizations: No    Attends Archivist Meetings: Never    Marital Status: Married    Allergies:  Allergies  Allergen Reactions   Flagyl [Metronidazole] Anaphylaxis   Cymbalta [Duloxetine Hcl]    Tape Rash    Metabolic Disorder Labs: Lab Results  Component Value Date   HGBA1C 4.8 04/28/2021   MPG 91 04/28/2021   MPG 97 03/30/2020   No results found for: "PROLACTIN" Lab Results  Component Value Date  CHOL 132 04/28/2021   TRIG 79 04/28/2021   HDL 76 04/28/2021   CHOLHDL 1.7 04/28/2021   LDLCALC 40 04/28/2021   LDLCALC 49 03/30/2020   Lab Results  Component Value Date    TSH 3.29 04/28/2021   TSH 1.97 03/30/2020    Therapeutic Level Labs: No results found for: "LITHIUM" No results found for: "VALPROATE" No results found for: "CBMZ"  Current Medications: Current Outpatient Medications  Medication Sig Dispense Refill   ASPIRIN LOW DOSE 81 MG tablet TAKE 1 TABLET BY MOUTH EVERY DAY 90 tablet 0   butalbital-acetaminophen-caffeine (FIORICET) 50-325-40 MG tablet Take 1 tablet by mouth 2 (two) times daily as needed for headache.     Cholecalciferol 250 MCG (10000 UT) CAPS Take by mouth.     cyanocobalamin (VITAMIN B12) 1000 MCG/ML injection Inject 1 mL (1,000 mcg total) into the muscle every 30 (thirty) days. 1 mL 11   diclofenac Sodium (VOLTAREN) 1 % GEL Apply 2 g topically 4 (four) times daily.     estradiol (ESTRACE) 1 MG tablet TAKE 1 TABLET EVERY DAY 90 tablet 1   etodolac (LODINE) 500 MG tablet Take 1 tablet by mouth 2 (two) times daily.     ezetimibe (ZETIA) 10 MG tablet Take 1 tablet (10 mg total) by mouth daily. 90 tablet 1   fluticasone (FLONASE) 50 MCG/ACT nasal spray SPRAY 2 SPRAYS INTO EACH NOSTRIL EVERY DAY 48 mL 0   furosemide (LASIX) 20 MG tablet Take 1 tablet (20 mg total) by mouth daily as needed for fluid or edema. 90 tablet 1   gabapentin (NEURONTIN) 600 MG tablet Take 1 tablet (600 mg total) by mouth in the morning, at noon, in the evening, and at bedtime. 360 tablet 1   hydrOXYzine (ATARAX/VISTARIL) 25 MG tablet TAKE 1/2 TO 1 TABLET BY MOUTH TWICE A DAY AS NEEDED FOR SEVERE PANIC ATTACKS/ ANXIETY 180 tablet 1   omeprazole (PRILOSEC) 40 MG capsule TAKE 1 CAPSULE BY MOUTH EVERY DAY 90 capsule 1   ondansetron (ZOFRAN ODT) 4 MG disintegrating tablet Take 1 tablet (4 mg total) by mouth every 8 (eight) hours as needed for nausea or vomiting. 30 tablet 2   oxyCODONE-acetaminophen (PERCOCET) 10-325 MG tablet Take 1 tablet by mouth 4 (four) times daily.     rizatriptan (MAXALT-MLT) 10 MG disintegrating tablet Take 1 tablet (10 mg total) by mouth as  needed for migraine. May repeat in 2 hours if needed, that is max dose for 24 hours. 10 tablet 3   rosuvastatin (CRESTOR) 40 MG tablet TAKE 1 TABLET EVERY DAY 90 tablet 1   SHINGRIX injection      tiZANidine (ZANAFLEX) 4 MG tablet Take 1 tablet (4 mg total) by mouth 4 (four) times daily. 360 tablet 1   traZODone (DESYREL) 100 MG tablet TAKE ONE TABLET BY MOUTH AT BEDTIME FOR SLEEP 90 tablet 0   venlafaxine XR (EFFEXOR-XR) 75 MG 24 hr capsule TAKE ONE CAPSULE BY MOUTH DAILY WITH BREAKFAST 90 capsule 1   Zoledronic Acid (ZOMETA) 4 MG/100ML IVPB Once per year from Orthopedic 100 mL    propranolol (INDERAL) 10 MG tablet Take 1 tablet (10 mg total) by mouth 3 (three) times daily as needed. For anxiety attacks 270 tablet 0   No current facility-administered medications for this visit.     Musculoskeletal: Strength & Muscle Tone:  UTA Gait & Station:  Seated Patient leans: N/A  Psychiatric Specialty Exam: Review of Systems  Musculoskeletal:  Positive for back pain (chronic).  Psychiatric/Behavioral:  Positive for sleep disturbance. The patient is nervous/anxious.   All other systems reviewed and are negative.   There were no vitals taken for this visit.There is no height or weight on file to calculate BMI.  General Appearance: Casual  Eye Contact:  Fair  Speech:  Clear and Coherent  Volume:  Normal  Mood:  Anxious  Affect:  Congruent  Thought Process:  Goal Directed and Descriptions of Associations: Intact  Orientation:  Full (Time, Place, and Person)  Thought Content: Logical   Suicidal Thoughts:  No  Homicidal Thoughts:  No  Memory:  Immediate;   Good Recent;   Fair Remote;   Fair reports short term memory loss  Judgement:  Fair  Insight:  Good  Psychomotor Activity:  Normal  Concentration:  Concentration: Fair and Attention Span: Fair  Recall:  [employer] of Knowledge: Fair  Language: Fair  Akathisia:  No  Handed:  Right  AIMS (if indicated): not done  Assets:   Communication Skills Desire for Improvement Housing Intimacy Social Support Transportation  ADL's:  Intact  Cognition: WNL  Sleep:   Restless due to pain   Screenings: AIMS    Flowsheet Row Video Visit from 06/13/2021 in [place] Video Visit from 05/16/2021 in [place] Total Score 0 0      GAD-7    Flowsheet Row Video Visit from 10/24/2021 in [place] Office Visit from 09/12/2021 in [facility] Office Visit from 07/13/2021 in [facility] Office Visit from 05/05/2021 in [facility] Office Visit from 03/14/2021 in [facility]  Total GAD-7 Score '5 1 2 2 2      '$ PHQ2-9    Flowsheet Row Video Visit from 10/24/2021 in [place] Office Visit from 09/12/2021 in [facility] Video Visit from 07/28/2021 in [place] Office Visit from 07/13/2021 in [facility] Video Visit from 05/16/2021 in [place]  PHQ-2 Total Score 0 1 0 2 6  PHQ-9 Total Score 4 3 -- 5 10      Flowsheet Row Video Visit from 10/24/2021 in [place] Video Visit from 07/28/2021 in [place] Video Visit from 05/16/2021 in [place] No Risk No Risk No Risk        Assessment and Plan: Ashley Fields is a 66 year old Caucasian female who has a history of PTSD, bipolar disorder, panic disorder, migraine headaches, chronic pain was evaluated by telemedicine today.  Patient continues to be in pain, has upcoming surgery, mood symptoms affected by pain, will need sufficient pain management.  Continue medications as noted below.  Plan Bipolar disorder type I mixed in remission Gabapentin 600 mg 4 times a day which she is taking for  pain.  PTSD-stable Venlafaxine 75 mg p.o. daily Hydroxyzine 25 mg p.o. daily as needed  Panic disorder-stable Venlafaxine 75 mg p.o. daily Propranolol 10 mg p.o. 3 times daily as needed for anxiety  Insomnia-unstable Trazodone 50-100 mg p.o. nightly Will need sufficient pain management.  Patient will need sufficient pain management.  Mood symptoms and sleep problems currently due to pain and does have upcoming surgery.  Follow-up in clinic in 2 to 3 months or sooner if needed.  Collaboration of Care: Collaboration of Care: Other patient advised to follow up with pain provider.  Patient/Guardian was advised Release of  Information must be obtained prior to any record release in order to collaborate their care with an outside provider. Patient/Guardian was advised if they have not already done so to contact the registration department to sign all necessary forms in order for [place] to release information regarding their care.   Consent: Patient/Guardian gives verbal consent for treatment and assignment of benefits for services provided during this visit. Patient/Guardian expressed understanding and agreed to proceed.   This note was generated in part or whole with voice recognition software. Voice recognition is usually quite accurate but there are transcription errors that can and very often do occur. I apologize for any typographical errors that were not detected and corrected.      Ursula Alert, MD 10/25/2021, 12:14 PM

## 2021-10-31 ENCOUNTER — Other Ambulatory Visit: Payer: Self-pay | Admitting: Neurological Surgery

## 2021-11-07 ENCOUNTER — Other Ambulatory Visit: Payer: Self-pay | Admitting: Family Medicine

## 2021-11-07 DIAGNOSIS — E782 Mixed hyperlipidemia: Secondary | ICD-10-CM

## 2021-11-08 NOTE — Telephone Encounter (Signed)
Requested Prescriptions  Pending Prescriptions Disp Refills  . ezetimibe (ZETIA) 10 MG tablet [Pharmacy Med Name: EZETIMIBE '10MG'$  TABS] 90 tablet 1    Sig: TAKE 1 TABLET BY MOUTH ONCE DAILY     Cardiovascular:  Antilipid - Sterol Transport Inhibitors Failed - 11/07/2021  3:33 PM      Failed - Lipid Panel in normal range within the last 12 months    Cholesterol  Date Value Ref Range Status  04/28/2021 132 <200 mg/dL Final   LDL Cholesterol (Calc)  Date Value Ref Range Status  04/28/2021 40 mg/dL (calc) Final    Comment:    Reference range: <100 . Desirable range <100 mg/dL for primary prevention;   <70 mg/dL for patients with CHD or diabetic patients  with > or = 2 CHD risk factors. Marland Kitchen LDL-C is now calculated using the Martin-Hopkins  calculation, which is a validated novel method providing  better accuracy than the Friedewald equation in the  estimation of LDL-C.  Cresenciano Genre et al. Annamaria Helling. 2585;277(82): 2061-2068  (http://education.[employer].com/faq/FAQ164)    HDL  Date Value Ref Range Status  04/28/2021 76 > OR = 50 mg/dL Final   Triglycerides  Date Value Ref Range Status  04/28/2021 79 <150 mg/dL Final         Passed - AST in normal range and within 360 days    AST  Date Value Ref Range Status  04/28/2021 31 10 - 35 U/L Final         Passed - ALT in normal range and within 360 days    ALT  Date Value Ref Range Status  04/28/2021 25 6 - 29 U/L Final         Passed - Patient is not pregnant      Passed - Valid encounter within last 12 months    Recent Outpatient Visits          1 month ago Hearing loss, unspecified hearing loss type, unspecified laterality   [place], DO   3 months ago Spinal stenosis of lumbar region without neurogenic claudication   [place], DO   6 months ago Annual physical exam   [place], DO   7  months ago Migraine without aura and with status migrainosus, not intractable   [place], DO   8 months ago Acute non-recurrent frontal sinusitis   [place], Devonne Doughty, DO      Future Appointments            Tomorrow [facility], [facility], [facility]

## 2021-11-09 ENCOUNTER — Ambulatory Visit (INDEPENDENT_AMBULATORY_CARE_PROVIDER_SITE_OTHER): Payer: Medicare HMO | Admitting: Family Medicine

## 2021-11-09 ENCOUNTER — Encounter: Payer: Self-pay | Admitting: Family Medicine

## 2021-11-09 VITALS — BP 137/61 | HR 81 | Ht 62.0 in | Wt 195.4 lb

## 2021-11-09 DIAGNOSIS — G8929 Other chronic pain: Secondary | ICD-10-CM | POA: Diagnosis not present

## 2021-11-09 DIAGNOSIS — J0110 Acute frontal sinusitis, unspecified: Secondary | ICD-10-CM | POA: Diagnosis not present

## 2021-11-09 DIAGNOSIS — M5441 Lumbago with sciatica, right side: Secondary | ICD-10-CM | POA: Diagnosis not present

## 2021-11-09 DIAGNOSIS — B001 Herpesviral vesicular dermatitis: Secondary | ICD-10-CM

## 2021-11-09 DIAGNOSIS — H00021 Hordeolum internum right upper eyelid: Secondary | ICD-10-CM

## 2021-11-09 DIAGNOSIS — M5442 Lumbago with sciatica, left side: Secondary | ICD-10-CM

## 2021-11-09 DIAGNOSIS — G894 Chronic pain syndrome: Secondary | ICD-10-CM | POA: Diagnosis not present

## 2021-11-09 MED ORDER — VALACYCLOVIR HCL 1 G PO TABS: 1000.0000 mg | ORAL_TABLET | Freq: Every day | ORAL | 0 refills | Status: DC

## 2021-11-09 MED ORDER — GABAPENTIN 600 MG PO TABS: 600.0000 mg | ORAL_TABLET | Freq: Four times a day (QID) | ORAL | 1 refills | Status: DC

## 2021-11-09 MED ORDER — POLYMYXIN B-TRIMETHOPRIM 10000-0.1 UNIT/ML-% OP SOLN: 1.0000 [drp] | Freq: Four times a day (QID) | OPHTHALMIC | 0 refills | Status: DC

## 2021-11-09 MED ORDER — AMOXICILLIN-POT CLAVULANATE 875-125 MG PO TABS: 1.0000 | ORAL_TABLET | Freq: Two times a day (BID) | ORAL | 0 refills | Status: DC

## 2021-11-09 NOTE — Progress Notes (Signed)
Subjective:    Patient ID: Ashley Fields, female    DOB: 22-Oct-1955, 66 y.o.   MRN: [id number]  Ashley Fields is a 66 y.o. female presenting on 11/09/2021 for sinus pressure and Mouth Lesions   HPI  Right Upper Internal Hordeolum Stye Onset few days, has recurrent issue yearly but different location and other eye previously usually resolve with Polytrim Eye drops  Sinusitis Bilateral Ear Pain Pressure Frontal Sinuses pressure Worsening recently, similar to before improved on augmentin  Recurrent Cold Sores Frequent problem Previously used Nurse, [facility]'s Surgery at [facility] - Dr Ronnald Ramp, 11/27/21       11/09/2021    8:26 AM 10/24/2021   11:30 AM 09/12/2021    2:13 PM  Depression screen PHQ 2/9  Decreased Interest 3  1  Down, Depressed, Hopeless 0  0  PHQ - 2 Score 3  1  Altered sleeping 0  0  Tired, decreased energy 0  1  Change in appetite 0  1  Feeling bad or failure about yourself  0  0  Trouble concentrating 0  0  Moving slowly or fidgety/restless 0  0  Suicidal thoughts 0  0  PHQ-9 Score 3  3  Difficult doing work/chores Not difficult at all  Not difficult at all     Information is confidential and restricted. Go to Review Flowsheets to unlock data.    Social History   Tobacco Use   Smoking status: Former    Packs/day: 0.25    Years: 10.00    Total pack years: 2.50    Types: Cigarettes    Quit date: 11/29/1995    Years since quitting: 25.9   Smokeless tobacco: Former   Tobacco comments:    Quite 1997, didnt smoke hardly at all when I was smoking.  Vaping Use   Vaping Use: Never used  Substance Use Topics   Alcohol use: No   Drug use: No    Review of Systems Per HPI unless specifically indicated above     Objective:    BP 137/61   Pulse 81   Ht '5\' 2"'$  (1.575 m)   Wt 195 lb 6.4 oz (88.6 kg)   SpO2 100%   BMI 35.74 kg/m   Wt Readings from Last 3 Encounters:  11/09/21 195 lb 6.4 oz (88.6 kg)  09/12/21 205 lb 3.2 oz (93.1 kg)   09/04/21 204 lb (92.5 kg)    Physical Exam Vitals and nursing note reviewed.  Constitutional:      General: She is not in acute distress.    Appearance: Normal appearance. She is well-developed. She is not diaphoretic.     Comments: Well-appearing, comfortable, cooperative  HENT:     Head: Normocephalic and atraumatic.  Eyes:     General:        Right eye: No discharge.        Left eye: No discharge.     Conjunctiva/sclera: Conjunctivae normal.     Comments: R upper eyelid internal hordeolum  Cardiovascular:     Rate and Rhythm: Normal rate.  Pulmonary:     Effort: Pulmonary effort is normal.  Skin:    General: Skin is warm and dry.     Findings: No erythema or rash.  Neurological:     Mental Status: She is alert and oriented to person, place, and time.  Psychiatric:        Mood and Affect: Mood normal.        Behavior: Behavior  normal.        Thought Content: Thought content normal.     Comments: Well groomed, good eye contact, normal speech and thoughts    Results for orders placed or performed in visit on 09/04/21  Vitamin B12  Result Value Ref Range   Vitamin B-12 553 180 - 914 pg/mL  Ferritin  Result Value Ref Range   Ferritin 43 11 - 307 ng/mL      Assessment & Plan:   Problem List Items Addressed This Visit     Chronic Low back pain (Primary Area of Pain) (Bilateral)  (R>L) (Chronic)   Relevant Medications   gabapentin (NEURONTIN) 600 MG tablet   Chronic pain syndrome (Chronic)   Relevant Medications   gabapentin (NEURONTIN) 600 MG tablet   Other Visit Diagnoses     Hordeolum internum of right upper eyelid    -  Primary   Relevant Medications   trimethoprim-polymyxin b (POLYTRIM) ophthalmic solution   Acute non-recurrent frontal sinusitis       Relevant Medications   amoxicillin-clavulanate (AUGMENTIN) 875-125 MG tablet   valACYclovir (VALTREX) 1000 MG tablet   Recurrent cold sores       Relevant Medications   valACYclovir (VALTREX) 1000 MG  tablet       Sinusitis, acute on chronic Start Augmentin for sinusitis  Recurrent oral cold sore Start Valtrex for Cold Sore, 5-7 days then repeat in future if need.  R eye Upper internal hordeolum stye Has had in other locations Use eye drops and warm compresses for the eye, if not improving can return to Dr Ellin Mayhew  Upcoming spinal / surgery per [facility] Dr Ronnald Ramp, revision of prior Refilled Gabapentin.   Meds ordered this encounter  Medications   trimethoprim-polymyxin b (POLYTRIM) ophthalmic solution    Sig: Place 1 drop into the right eye every 6 (six) hours.    Dispense:  10 mL    Refill:  0   amoxicillin-clavulanate (AUGMENTIN) 875-125 MG tablet    Sig: Take 1 tablet by mouth 2 (two) times daily.    Dispense:  20 tablet    Refill:  0   valACYclovir (VALTREX) 1000 MG tablet    Sig: Take 1 tablet (1,000 mg total) by mouth daily. For 5-7 days, may repeat if need for cold sore    Dispense:  30 tablet    Refill:  0   gabapentin (NEURONTIN) 600 MG tablet    Sig: Take 1 tablet (600 mg total) by mouth in the morning, at noon, in the evening, and at bedtime.    Dispense:  360 tablet    Refill:  1      Follow up plan: Return if symptoms worsen or fail to improve.   Nobie Putnam, DO [facility] 11/09/2021, 8:16 AM

## 2021-11-09 NOTE — Patient Instructions (Addendum)
Thank you for coming to the office today.  Start Augmentin for sinusitis  Start Valtrex for Cold Sore, 5-7 days then repeat in future if need.  Use eye drops and warm compresses for the eye, if not improving can return to Dr Ellin Mayhew  Refilled Gabapentin.  Please schedule a Follow-up Appointment to: Return if symptoms worsen or fail to improve.  If you have any other questions or concerns, please feel free to call the office or send a message through [facility]. You may also schedule an earlier appointment if necessary.  Additionally, you may be receiving a survey about your experience at our office within a few days to 1 week by e-mail or mail. We value your feedback.  Nobie Putnam, DO [facility]

## 2021-11-14 ENCOUNTER — Ambulatory Visit: Payer: Medicare HMO | Admitting: Vascular Surgery

## 2021-11-14 ENCOUNTER — Encounter: Payer: Self-pay | Admitting: Vascular Surgery

## 2021-11-14 VITALS — BP 158/94 | HR 73 | Temp 98.0°F | Resp 20 | Ht 62.0 in | Wt 198.0 lb

## 2021-11-14 DIAGNOSIS — G8929 Other chronic pain: Secondary | ICD-10-CM

## 2021-11-14 DIAGNOSIS — M5442 Lumbago with sciatica, left side: Secondary | ICD-10-CM | POA: Diagnosis not present

## 2021-11-14 DIAGNOSIS — M5441 Lumbago with sciatica, right side: Secondary | ICD-10-CM | POA: Diagnosis not present

## 2021-11-14 NOTE — Pre-Procedure Instructions (Signed)
Surgical Instructions    Your procedure is scheduled on Monday November 20.  Report to [facility] Main Entrance "A" at 5:30 A.M., then check in with the Admitting office.  Call this number if you have problems the morning of surgery:  [phone]   If you have any questions prior to your surgery date call [phone]: Open Monday-Friday 8am-4pm If you experience any cold or flu symptoms such as cough, fever, chills, shortness of breath, etc. between now and your scheduled surgery, please notify us at the above number     Remember:  Do not eat or drink after midnight the night before your surgery    Take these medicines the morning of surgery with A SIP OF WATER:  amoxicillin-clavulanate (AUGMENTIN) 875-125 MG tablet  estradiol (ESTRACE)  ezetimibe (ZETIA)  fluticasone (FLONASE) 50 MCG/ACT nasal spray  gabapentin (NEURONTIN)  omeprazole (PRILOSEC)  oxyCODONE-acetaminophen (PERCOCET)  rosuvastatin (CRESTOR)  venlafaxine XR (EFFEXOR-XR  hydrOXYzine (ATARAX/VISTARIL  if needed ondansetron (ZOFRAN ODT)  if needed propranolol (INDERAL  if needed rizatriptan (MAXALT-MLT)  if needed valACYclovir (VALTREX)  if needed  Follow your surgeon's instructions on when to stop Aspirin.  If no instructions were given by your surgeon then you will need to call the office to get those instructions.     As of today, STOP taking any diclofenac Sodium (VOLTAREN) 1 % GEL, etodolac (LODINE), Aleve, Naproxen, Ibuprofen, Motrin, Advil, Goody's, BC's, all herbal medications, fish oil, and all vitamins.            [facility] is not responsible for any belongings or valuables.    Do NOT Smoke (Tobacco/Vaping)  24 hours prior to your procedure  If you use a CPAP at night, you may bring your mask for your overnight stay.   Contacts, glasses, hearing aids, dentures or partials may not be worn into surgery, please bring cases for these belongings   For patients admitted to the hospital, discharge time  will be determined by your treatment team.   Patients discharged the day of surgery will not be allowed to drive home, and someone needs to stay with them for 24 hours.   SURGICAL WAITING ROOM VISITATION Patients having surgery or a procedure may have no more than 2 support people in the waiting area - these visitors may rotate.   Children under the age of 22 must have an adult with them who is not the patient. If the patient needs to stay at the hospital during part of their recovery, the visitor guidelines for inpatient rooms apply. Pre-op nurse will coordinate an appropriate time for 1 support person to accompany patient in pre-op.  This support person may not rotate.   Please refer to [email or web address] for the visitor guidelines for Inpatients (after your surgery is over and you are in a regular room).    Special instructions:    Oral Hygiene is also important to reduce your risk of infection.  Remember - BRUSH YOUR TEETH THE MORNING OF SURGERY WITH YOUR REGULAR TOOTHPASTE   [place]- Preparing For Surgery  Before surgery, you can play an important role. Because skin is not sterile, your skin needs to be as free of germs as possible. You can reduce the number of germs on your skin by washing with CHG (chlorahexidine gluconate) Soap before surgery.  CHG is an antiseptic cleaner which kills germs and bonds with the skin to continue killing germs even after washing.     Please do not use if you have an  allergy to CHG or antibacterial soaps. If your skin becomes reddened/irritated stop using the CHG.  Do not shave (including legs and underarms) for at least 48 hours prior to first CHG shower. It is OK to shave your face.  Please follow these instructions carefully.     Shower the NIGHT BEFORE SURGERY and the MORNING OF SURGERY with CHG Soap.   If you chose to wash your hair, wash your hair first as usual with your normal shampoo.  After you shampoo, rinse your hair and body thoroughly to remove the shampoo.  Then [employer] and genitals (private parts) with your normal soap and rinse thoroughly to remove soap.  After that Use CHG Soap as you would any other liquid soap. You can apply CHG directly to the skin and wash gently with a scrungie or a clean washcloth.   Apply the CHG Soap to your body ONLY FROM THE NECK DOWN.  Do not use on open wounds or open sores. Avoid contact with your eyes, ears, mouth and genitals (private parts). Wash Face and genitals (private parts)  with your normal soap.   Wash thoroughly, paying special attention to the area where your surgery will be performed.  Thoroughly rinse your body with warm water from the neck down.  DO NOT shower/wash with your normal soap after using and rinsing off the CHG Soap.  Pat yourself dry with a CLEAN TOWEL.  Wear CLEAN PAJAMAS to bed the night before surgery  Place CLEAN SHEETS on your bed the night before your surgery  DO NOT SLEEP WITH PETS.   Day of Surgery:  Take a shower with CHG soap. Wear Clean/Comfortable clothing the morning of surgery Do not wear jewelry or makeup. Do not wear lotions, powders, perfumes/cologne or deodorant. Do not shave 48 hours prior to surgery.  Men may shave face and neck. Do not bring valuables to the hospital. Do not wear nail polish, gel polish, artificial nails, or any other type of covering on natural nails (fingers and toes) If you have artificial nails or gel coating that need to be removed by a nail salon, please have this removed prior to surgery. Artificial nails or gel coating may interfere with anesthesia's ability to adequately monitor your vital signs. Remember to brush your teeth WITH YOUR REGULAR TOOTHPASTE.    If you received a COVID test during your pre-op visit, it is requested that you wear a mask when out in public, stay away from anyone that may not be feeling well, and notify your surgeon if you  develop symptoms. If you have been in contact with anyone that has tested positive in the last 10 days, please notify your surgeon.    Please read over the following fact sheets that you were given.

## 2021-11-14 NOTE — Progress Notes (Signed)
Patient name: Gerrica Cygan MRN: [id number] DOB: [birth year] Sex: female  REASON FOR CONSULT: Evaluate for abdominal exposure for L5-S1 ALIF  HPI: Letrice Pollok is a 66 y.o. female, with history of chronic kidney disease, hyperlipidemia, chronic back pain that presents for evaluation of anterior spine exposure for L5-S1 ALIF.  Patient describes chronic lower back pain with right leg radiculopathy.  Her MRI has showed spondylolisthesis at L5-S1 with foraminal stenosis even after previous surgery at this level.  She also has degenerative spondylolisthesis and stenosis at L2-L3.  He is also playing a L2-L3 PLIF.  She has been evaluated by Dr. Ronnald Ramp who has recommended an L5-S1 ALIF and asked vascular surgery to assist.  Her previous abdominal surgery includes appendectomy, hysterectomy, cholecystectomy, C-section, gastric bypass as well as a tummy tuck.  She states no previous anterior spine surgery.  Past Medical History:  Diagnosis Date   Allergy    Anemia    Anxiety    Arthritis    Yes   Back pain    Blood transfusion without reported diagnosis 1977   Transfusions from miscarriage & hemorrhaging   Chronic kidney disease    Depression    Disp fx of cuboid bone of right foot, init for clos fx 01/01/2017   GERD (gastroesophageal reflux disease)    Headache    Hepatitis C    Hyperlipidemia    Lumbar radiculopathy    Neck pain    Neuromuscular disorder ([place]) 1997   Falling to much was an eye-opener   Osteoporosis    Thyroid disease    Urine incontinence     Past Surgical History:  Procedure Laterality Date   5 miscarriages     1977-1985, Blood transfusion s/p miscarriage [place]   Total   APPENDECTOMY  12/2008   AUGMENTATION MAMMAPLASTY Bilateral 2015   Bilat   AUGMENTATION MAMMAPLASTY Bilateral 2018   implants redone w/ placement of implants under muscle   breast lift bilateral, implants  78/24/2353   bilateral, silicon naturel   BREAST REDUCTION  SURGERY Bilateral 1997   CESAREAN SECTION  07/27/1981   Placenta Previa   CHOLECYSTECTOMY  03/2004   Lap surgery   COLONOSCOPY WITH PROPOFOL N/A 06/13/2020   Procedure: COLONOSCOPY WITH PROPOFOL;  Surgeon: Virgel Manifold, MD;  Location: [facility] ENDOSCOPY;  Service: Endoscopy;  Laterality: N/A;   COSMETIC SURGERY     Breast implants w/ lift, tummy tuck, upper & lower Blepharop   ESOPHAGOGASTRODUODENOSCOPY (EGD) WITH PROPOFOL N/A 06/13/2020   Procedure: ESOPHAGOGASTRODUODENOSCOPY (EGD) WITH PROPOFOL;  Surgeon: Virgel Manifold, MD;  Location: [facility] ENDOSCOPY;  Service: Endoscopy;  Laterality: N/A;   EYE SURGERY  2017   Cataract surgery & lasik   GASTRIC BYPASS  2005   Laparoscopic   IVC FILTER INSERTION  12/2003   TrapEase Vena Cava Filter   LUMBAR LAMINECTOMY  06/2006   L4-L5 (spinal fusion)   mini tummy tuck  05/07/2016   Bilateral bra/back roll lift skin removal   neck surgery C3-C7  02/10/2015   ACDF   REDUCTION MAMMAPLASTY Bilateral 1997   SHOULDER ACROMIOPLASTY Left 03/27/2013   w/ labral debridement   SMALL INTESTINE SURGERY  12/24/2003   Lap Gastric Bypass   SPINAL CORD STIMULATOR IMPLANT  11/2010   removed 05/2011   SPINE SURGERY  2008 2017   Fusion L4-L5, ACDF C4-C7    Family History  Problem Relation Age of Onset   COPD Mother    Lung cancer  Mother    Diabetes Mother    Heart disease Mother    Stroke Mother    Heart attack Mother    Arthritis Mother    Cancer Mother    Hypertension Mother    Obesity Mother    Varicose Veins Mother    Heart disease Father    Heart attack Father 97   Early death Father    Depression Sister        x 66 sisters   COPD Sister    Hypertension Sister    Heart disease Brother        x 3 brothers   Diabetes Brother    Colon polyps Sister    Hearing loss Sister    Heart attack Sister    Heart attack Brother    Arthritis Sister    Varicose Veins Sister    Arthritis Brother    Heart disease Brother    Hypertension Brother     Cancer Maternal Grandfather    Stroke Maternal Grandfather    COPD Sister    Obesity Sister    Depression Sister    Depression Sister    Heart disease Sister    Hypertension Sister    Diabetes Brother    Heart disease Brother    Hypertension Brother    Obesity Brother    Varicose Veins Brother    Heart disease Brother    Hypertension Brother    Obesity Brother    Hypertension Sister    Obesity Sister    Miscarriages / Stillbirths Maternal Aunt    Miscarriages / Stillbirths Paternal Aunt    Breast cancer Neg Hx     SOCIAL HISTORY: Social History   Socioeconomic History   Marital status: Married    Spouse name: Not on file   Number of children: 1   Years of education: High School   Highest education level: Some college, no degree  Occupational History   Occupation: disability  Tobacco Use   Smoking status: Former    Packs/day: 0.25    Years: 10.00    Total pack years: 2.50    Types: Cigarettes    Quit date: 11/29/1995    Years since quitting: 25.9   Smokeless tobacco: Former   Tobacco comments:    Quite 1997, didnt smoke hardly at all when I was smoking.  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]   Social Determinants of Health   Financial Resource Strain: [place]  (04/06/2021)   Overall Financial Resource Strain (CARDIA)    Difficulty of Paying Living Expenses: Not very hard  Food Insecurity: No Food Insecurity (04/06/2021)   Hunger Vital Sign    Worried About Running Out of Food in the Last Year: Never true    Ran Out of Food in the Last Year: Never true  Transportation Needs: No Transportation Needs  (04/06/2021)   PRAPARE - Hydrologist (Medical): No    Lack of Transportation (Non-Medical): No  Physical Activity: Insufficiently Active (04/06/2021)   Exercise Vital Sign    Days of Exercise per Week: 2 days  Minutes of Exercise per Session: 10 min  Stress: Stress Concern Present (04/06/2021)   [place]    Feeling of Stress : Very much  Social Connections: Moderately Isolated (04/06/2021)   Social Connection and Isolation Panel [NHANES]    Frequency of Communication with Friends and Family: More than three times a week    Frequency of Social Gatherings with Friends and Family: More than three times a week    Attends Religious Services: Never    Marine scientist or Organizations: No    Attends Archivist Meetings: Never    Marital Status: Married  Human resources officer Violence: Not At Risk (04/06/2021)   Humiliation, Afraid, Rape, and Kick questionnaire    Fear of Current or Ex-Partner: No    Emotionally Abused: No    Physically Abused: No    Sexually Abused: No    Allergies  Allergen Reactions   Flagyl [Metronidazole] Anaphylaxis   Cymbalta [Duloxetine Hcl]    Tape Rash    Current Outpatient Medications  Medication Sig Dispense Refill   amoxicillin-clavulanate (AUGMENTIN) 875-125 MG tablet Take 1 tablet by mouth 2 (two) times daily. 20 tablet 0   ASPIRIN LOW DOSE 81 MG tablet TAKE 1 TABLET BY MOUTH EVERY DAY 90 tablet 0   butalbital-acetaminophen-caffeine (FIORICET) 50-325-40 MG tablet Take 1 tablet by mouth 2 (two) times daily as needed for headache.     Cholecalciferol 250 MCG (10000 UT) CAPS Take by mouth.     cyanocobalamin (VITAMIN B12) 1000 MCG/ML injection Inject 1 mL (1,000 mcg total) into the muscle every 30 (thirty) days. 1 mL 11   diclofenac Sodium (VOLTAREN) 1 % GEL Apply 2 g topically 4 (four) times daily.     estradiol (ESTRACE) 1 MG tablet TAKE 1 TABLET  EVERY DAY 90 tablet 1   etodolac (LODINE) 500 MG tablet Take 1 tablet by mouth 2 (two) times daily.     ezetimibe (ZETIA) 10 MG tablet TAKE 1 TABLET BY MOUTH ONCE DAILY 90 tablet 1   fluticasone (FLONASE) 50 MCG/ACT nasal spray SPRAY 2 SPRAYS INTO EACH NOSTRIL EVERY DAY 48 mL 0   furosemide (LASIX) 20 MG tablet Take 1 tablet (20 mg total) by mouth daily as needed for fluid or edema. 90 tablet 1   gabapentin (NEURONTIN) 600 MG tablet Take 1 tablet (600 mg total) by mouth in the morning, at noon, in the evening, and at bedtime. 360 tablet 1   hydrOXYzine (ATARAX/VISTARIL) 25 MG tablet TAKE 1/2 TO 1 TABLET BY MOUTH TWICE A DAY AS NEEDED FOR SEVERE PANIC ATTACKS/ ANXIETY 180 tablet 1   omeprazole (PRILOSEC) 40 MG capsule TAKE 1 CAPSULE BY MOUTH EVERY DAY 90 capsule 1   ondansetron (ZOFRAN ODT) 4 MG disintegrating tablet Take 1 tablet (4 mg total) by mouth every 8 (eight) hours as needed for nausea or vomiting. 30 tablet 2   oxyCODONE-acetaminophen (PERCOCET) 10-325 MG tablet Take 1 tablet by mouth 4 (four) times daily.     propranolol (INDERAL) 10 MG tablet Take 1 tablet (10 mg total) by mouth 3 (three) times daily as needed. For anxiety attacks 270 tablet 0   rizatriptan (MAXALT-MLT) 10 MG disintegrating tablet Take 1 tablet (10 mg total) by mouth as needed for migraine. May repeat in 2 hours if needed, that is max dose for 24 hours. 10 tablet 3   rosuvastatin (CRESTOR) 40 MG tablet TAKE 1 TABLET EVERY DAY 90 tablet 1   SHINGRIX  injection      tiZANidine (ZANAFLEX) 4 MG tablet Take 1 tablet (4 mg total) by mouth 4 (four) times daily. 360 tablet 1   traZODone (DESYREL) 100 MG tablet TAKE ONE TABLET BY MOUTH AT BEDTIME FOR SLEEP 90 tablet 0   trimethoprim-polymyxin b (POLYTRIM) ophthalmic solution Place 1 drop into the right eye every 6 (six) hours. 10 mL 0   valACYclovir (VALTREX) 1000 MG tablet Take 1 tablet (1,000 mg total) by mouth daily. For 5-7 days, may repeat if need for cold sore 30 tablet 0    venlafaxine XR (EFFEXOR-XR) 75 MG 24 hr capsule TAKE ONE CAPSULE BY MOUTH DAILY WITH BREAKFAST 90 capsule 1   Zoledronic Acid (ZOMETA) 4 MG/100ML IVPB Once per year from Orthopedic 100 mL    No current facility-administered medications for this visit.    REVIEW OF SYSTEMS:  '[X]'$  denotes positive finding, '[ ]'$  denotes negative finding Cardiac  Comments:  Chest pain or chest pressure:    Shortness of breath upon exertion:    Short of breath when lying flat:    Irregular heart rhythm:        Vascular    Pain in calf, thigh, or hip brought on by ambulation:    Pain in feet at night that wakes you up from your sleep:     Blood clot in your veins:    Leg swelling:         Pulmonary    Oxygen at home:    Productive cough:     Wheezing:         Neurologic    Sudden weakness in arms or legs:     Sudden numbness in arms or legs:     Sudden onset of difficulty speaking or slurred speech:    Temporary loss of vision in one eye:     Problems with dizziness:         Gastrointestinal    Blood in stool:     Vomited blood:         Genitourinary    Burning when urinating:     Blood in urine:        Psychiatric    Major depression:         Hematologic    Bleeding problems:    Problems with blood clotting too easily:        Skin    Rashes or ulcers:        Constitutional    Fever or chills:      PHYSICAL EXAM: Vitals:   11/14/21 1406  BP: (!) 158/94  Pulse: 73  Resp: 20  Temp: 98 F (36.7 C)  SpO2: 93%  Weight: 198 lb (89.8 kg)  Height: '5\' 2"'$  (1.575 m)    GENERAL: The patient is a well-nourished female, in no acute distress. The vital signs are documented above. CARDIAC: There is a regular rate and rhythm.  VASCULAR:  Palpable femoral pulses bilaterally Palpable AT pulses bilaterally PULMONARY: No respiratory distress. ABDOMEN: Soft and non-tender.  Previous abdominoplasty incision.   MUSCULOSKELETAL: There are no major deformities or cyanosis. NEUROLOGIC: No  focal weakness or paresthesias are detected. PSYCHIATRIC: The patient has a normal affect.  DATA:  MRI Lumbar reviewed 08/15/21   Assessment/Plan:  66 year old female with chronic lower back pain with right leg radiculopathy that vascular surgery has been asked to evaluate for anterior spine exposure for L5-S1 ALIF.  I have reviewed her MRI imaging and I think she would be an  appropriate candidate for anterior approach.  She has had a significant amount of abdominal surgery that could make anterior exposure more tedious including appendectomy, hysterectomy, cholecystectomy, C-section, gastric bypass and abdominoplasty.  I discussed transverse incision over the left rectus muscle and mobilizing the rectus muscles to enter into the retroperitoneum and mobilize peritoneum and left ureter across midline.  Discussed mobilizing iliac artery and vein if needed. Discussed risk of injury the above structures including iliac vein or artery.  All questions answered.  She is scheduled for 12/18/2021.   Marty Heck, MD [facility] Office: [phone]

## 2021-11-15 ENCOUNTER — Other Ambulatory Visit: Payer: Self-pay

## 2021-11-15 ENCOUNTER — Encounter (HOSPITAL_COMMUNITY)
Admission: RE | Admit: 2021-11-15 | Discharge: 2021-11-15 | Disposition: A | Discharge status: Home / Self Care | Payer: Medicare HMO | Source: Ambulatory Visit | Attending: Neurological Surgery | Admitting: Neurological Surgery

## 2021-11-15 ENCOUNTER — Encounter (HOSPITAL_COMMUNITY): Payer: Self-pay

## 2021-11-15 VITALS — BP 128/82 | HR 74 | Temp 98.2°F | Resp 19 | Ht 62.0 in | Wt 199.0 lb

## 2021-11-15 DIAGNOSIS — Z01812 Encounter for preprocedural laboratory examination: Secondary | ICD-10-CM | POA: Insufficient documentation

## 2021-11-15 DIAGNOSIS — M5416 Radiculopathy, lumbar region: Secondary | ICD-10-CM | POA: Diagnosis not present

## 2021-11-15 DIAGNOSIS — Z01818 Encounter for other preprocedural examination: Secondary | ICD-10-CM

## 2021-11-15 DIAGNOSIS — Z8619 Personal history of other infectious and parasitic diseases: Secondary | ICD-10-CM | POA: Diagnosis not present

## 2021-11-15 HISTORY — DX: Fibromyalgia: M79.7

## 2021-11-15 LAB — SURGICAL PCR SCREEN
MRSA, PCR: NEGATIVE
Staphylococcus aureus: NEGATIVE

## 2021-11-15 LAB — COMPREHENSIVE METABOLIC PANEL
ALT: 58 U/L — ABNORMAL HIGH (ref 0–44)
AST: 65 U/L — ABNORMAL HIGH (ref 15–41)
Albumin: 4.1 g/dL (ref 3.5–5.0)
Alkaline Phosphatase: 34 U/L — ABNORMAL LOW (ref 38–126)
Anion gap: 3 — ABNORMAL LOW (ref 5–15)
BUN: 16 mg/dL (ref 8–23)
CO2: 30 mmol/L (ref 22–32)
Calcium: 9 mg/dL (ref 8.9–10.3)
Chloride: 110 mmol/L (ref 98–111)
Creatinine, Ser: 0.69 mg/dL (ref 0.44–1.00)
GFR, Estimated: >60 mL/min (ref >60)
Glucose, Bld: 94 mg/dL (ref 70–99)
Potassium: 4.2 mmol/L (ref 3.5–5.1)
Sodium: 143 mmol/L (ref 135–145)
Total Bilirubin: 0.6 mg/dL (ref 0.3–1.2)
Total Protein: 7.2 g/dL (ref 6.5–8.1)

## 2021-11-15 LAB — CBC
HCT: 45 % (ref 36.0–46.0)
Hemoglobin: 14.5 g/dL (ref 12.0–15.0)
MCH: 30.5 pg (ref 26.0–34.0)
MCHC: 32.2 g/dL (ref 30.0–36.0)
MCV: 94.5 fL (ref 80.0–100.0)
Platelets: 171 10*3/uL (ref 150–400)
RBC: 4.76 MIL/uL (ref 3.87–5.11)
RDW: 12.2 % (ref 11.5–15.5)
WBC: 5.1 10*3/uL (ref 4.0–10.5)
nRBC: 0 % (ref 0.0–0.2)

## 2021-11-15 LAB — PROTIME-INR
INR: 1 (ref 0.8–1.2)
Prothrombin Time: 12.8 seconds (ref 11.4–15.2)

## 2021-11-15 LAB — ABO/RH: ABO/RH(D): O POS

## 2021-11-15 NOTE — Progress Notes (Signed)
PCP - Nobie Putnam Cardiologist - Ida Rogue  PPM/ICD - denies   Chest x-ray - n/a EKG - 03/13/21 Stress Test - 08/08/20 ECHO - 08/08/20 Cardiac Cath - denies  Sleep Study - denies  Follow your surgeon's instructions on when to stop Aspirin.  If no instructions were given by your surgeon then you will need to call the office to get those instructions.     ERAS Protcol -no   COVID TEST- not needed   Anesthesia review: yes, cardiac history  Patient denies shortness of breath, fever, cough and chest pain at PAT appointment   All instructions explained to the patient, with a verbal understanding of the material. Patient agrees to go over the instructions while at home for a better understanding. Patient also instructed to self quarantine after being tested for COVID-19. The opportunity to ask questions was provided.

## 2021-11-15 NOTE — Progress Notes (Signed)
Patient will need 2 type and screens the DOS. Had to cancel out order at PAT appt due to surgery date change.

## 2021-11-15 NOTE — Progress Notes (Signed)
Surgical Instructions    Your procedure is scheduled on Monday, 11/27/21.  Report to [facility] Main Entrance "A" at 5:30 A.M., then check in with the Admitting office.  Call this number if you have problems the morning of surgery:  [phone]   If you have any questions prior to your surgery date call [phone]: Open Monday-Friday 8am-4pm If you experience any cold or flu symptoms such as cough, fever, chills, shortness of breath, etc. between now and your scheduled surgery, please notify us at the above number     Remember:  Do not eat or drink after midnight the night before your surgery      Take these medicines the morning of surgery with A SIP OF WATER:  estradiol (ESTRACE)  ezetimibe (ZETIA)  fluticasone (FLONASE)  gabapentin (NEURONTIN)  omeprazole (PRILOSEC)  oxyCODONE-acetaminophen (PERCOCET)  propranolol (INDERAL)  rosuvastatin (CRESTOR)  tiZANidine (ZANAFLEX)  venlafaxine XR (EFFEXOR-XR)   IF NEEDED: butalbital-acetaminophen-caffeine (FIORICET)  hydrOXYzine (ATARAX/VISTARIL)  ondansetron (ZOFRAN ODT)  rizatriptan (MAXALT-MLT)   As of today, STOP taking any Aspirin (unless otherwise instructed by your surgeon) Aleve, Naproxen, Ibuprofen, Motrin, Advil, Goody's, BC's, all herbal medications, fish oil, diclofenac Sodium (VOLTAREN), etodolac (LODINE) and all vitamins.           Do not wear jewelry or makeup. Do not wear lotions, powders, perfumes/cologne or deodorant. Do not shave 48 hours prior to surgery.   Do not bring valuables to the hospital. Do not wear nail polish, gel polish, artificial nails, or any other type of covering on natural nails (fingers and toes) If you have artificial nails or gel coating that need to be removed by a nail salon, please have this removed prior to surgery. Artificial nails or gel coating may interfere with anesthesia's ability to adequately monitor your vital signs.  [facility] is not responsible for any belongings or  valuables.    Do NOT Smoke (Tobacco/Vaping)  24 hours prior to your procedure  If you use a CPAP at night, you may bring your mask for your overnight stay.   Contacts, glasses, hearing aids, dentures or partials may not be worn into surgery, please bring cases for these belongings   For patients admitted to the hospital, discharge time will be determined by your treatment team.   Patients discharged the day of surgery will not be allowed to drive home, and someone needs to stay with them for 24 hours.   SURGICAL WAITING ROOM VISITATION Patients having surgery or a procedure may have no more than 2 support people in the waiting area - these visitors may rotate.   Children under the age of 16 must have an adult with them who is not the patient. If the patient needs to stay at the hospital during part of their recovery, the visitor guidelines for inpatient rooms apply. Pre-op nurse will coordinate an appropriate time for 1 support person to accompany patient in pre-op.  This support person may not rotate.   Please refer to [email or web address] for the visitor guidelines for Inpatients (after your surgery is over and you are in a regular room).    Special instructions:    Oral Hygiene is also important to reduce your risk of infection.  Remember - BRUSH YOUR TEETH THE MORNING OF SURGERY WITH YOUR REGULAR TOOTHPASTE   [facility]- Preparing For Surgery  Before surgery, you can play an important role. Because skin is not sterile, your skin needs to be as free of germs as possible. You can reduce  the number of germs on your skin by washing with CHG (chlorahexidine gluconate) Soap before surgery.  CHG is an antiseptic cleaner which kills germs and bonds with the skin to continue killing germs even after washing.     Please do not use if you have an allergy to CHG or antibacterial soaps. If your skin becomes reddened/irritated stop using the  CHG.  Do not shave (including legs and underarms) for at least 48 hours prior to first CHG shower. It is OK to shave your face.  Please follow these instructions carefully.     Shower the NIGHT BEFORE SURGERY and the MORNING OF SURGERY with CHG Soap.   If you chose to wash your hair, wash your hair first as usual with your normal shampoo. After you shampoo, rinse your hair and body thoroughly to remove the shampoo.  Then [employer] and genitals (private parts) with your normal soap and rinse thoroughly to remove soap.  After that Use CHG Soap as you would any other liquid soap. You can apply CHG directly to the skin and wash gently with a scrungie or a clean washcloth.   Apply the CHG Soap to your body ONLY FROM THE NECK DOWN.  Do not use on open wounds or open sores. Avoid contact with your eyes, ears, mouth and genitals (private parts). Wash Face and genitals (private parts)  with your normal soap.   Wash thoroughly, paying special attention to the area where your surgery will be performed.  Thoroughly rinse your body with warm water from the neck down.  DO NOT shower/wash with your normal soap after using and rinsing off the CHG Soap.  Pat yourself dry with a CLEAN TOWEL.  Wear CLEAN PAJAMAS to bed the night before surgery  Place CLEAN SHEETS on your bed the night before your surgery  DO NOT SLEEP WITH PETS.   Day of Surgery: Take a shower with CHG soap. Wear Clean/Comfortable clothing the morning of surgery Do not apply any deodorants/lotions.   Remember to brush your teeth WITH YOUR REGULAR TOOTHPASTE.    If you received a COVID test during your pre-op visit, it is requested that you wear a mask when out in public, stay away from anyone that may not be feeling well, and notify your surgeon if you develop symptoms. If you have been in contact with anyone that has tested positive in the last 10 days, please notify your surgeon.    Please read over the following fact sheets  that you were given.

## 2021-11-17 NOTE — Progress Notes (Addendum)
Anesthesia Chart Review:  Case: [id number] Date/Time: 12/18/21 0715   Procedures:      ALIF  - L5-S1     PLIF  - L2-L3 (Back)     ABDOMINAL EXPOSURE   Anesthesia type: General   Pre-op diagnosis: Spondylolisthesis - stenosis   Location: [facility] OR ROOM 18 / [place] OR   Surgeons: Eustace Moore, MD; Marty Heck, MD       DISCUSSION: Patient is a 66 year old female scheduled for the above procedure.  Surgery was initially scheduled for 11/27/2021 however just following her PAT RN visit surgery was rescheduled for 12/18/2021.    History includes former smoker (quit 11/29/95), HLD, GERD, CKD, anemia, anxiety, Hepatitis C (related to PRBC transfusion 1977 for miscarriage hemorrhage; s/p treatment), fibromyalgia, gastric bypass (2005), neck surgery (C3-7 fusion 200107), back surgery (lumbar laminectomy 2008, spinal cord stimulator 2012; "laminectomy/decompression with lumbar posterior fusion with anterior interbody approach by Dr. Sharyne Richters on 10/28/17"), small intestine surgery (12/2003), IVC filter (12/2003), cholecystectomy (2006), appendectomy (2010), cosmetic surgery (breast reduction & mammoplasty, abdominoplasty). BMI is consistent with obesity.   She is followed by cardiologist Dr. Ida Rogue. 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 She had only minimal carotid disease by 05/23/17 [place]. 06/06/17 CT coronary calcium scoring was 2456 (99th percentile), so LHC or perfusion study recommended given extensive coronary calcifications. She had a low risk stress test on 06/13/17. She had another low risk nuclear stress test on 08/08/20 for atypical left sided symptoms. EF 55-65%. Last visit 02/01/21 for routine follow-up. Weight had been trending up. Noted some SOB with walking and leaning over to tie her shoes. No symptoms concerning for angina. Recommended continued statin therapy,  lifestyle modification with walking program, and 12 month follow-up.   Surgery was rescheduled following her 11/15/21 PAT visit, so labs had already been drawn. AST and ALT mildly elevated at 65 and 58, respectively. PLT count normal at 171K and PT/INR normal at 12.8/1.0. She does have history of hepatic steatosis (by [place]) and treated hepatitis C. No alcohol use or illicit drugs is documented. She is on statin medication and as needed pain medications. Updated labs as indicated prior to surgery, otherwise continued out-patient follow-up.   Given plans for lumbar fusion surgery (OR room is booked for 6+ hours) and known history, recommend cardiology preoperative risk assessment. I have notified Lorriane Shire at Dr. Ronnald Ramp' office.    ADDENDUM 12/11/21 11:46 AM: She had preoperative telephonic cardiology evaluation today by Melina Copa, PA-C. She wrote, "Preoperative Cardiovascular Risk Assessment: RCRI 0.9% indicating low CV risk. The patient affirms she has been doing well without any new cardiac symptoms. They are able to achieve over 4 METS without cardiac limitations. Therefore, based on ACC/AHA guidelines, the patient would be at acceptable risk for the planned procedure without further cardiovascular testing. The patient was advised that if she develops new symptoms prior to surgery to contact our office to arrange for a follow-up visit, and she verbalized understanding. The patient has no history of MI, PCI or CABG therefore no acute contraindication to holding aspirin if needed for surgery. She reports she has already been holding this per instructions from surgery team."   VS: 11/10/21: HT '5\' 2"'$ . WT 90.3 KG. BMI 36.40.  BP Readings from Last 3 Encounters:  11/15/21 128/82  11/14/21 (!) 158/94  11/09/21 137/61   Pulse Readings  from Last 3 Encounters:  11/15/21 74  11/14/21 73  11/09/21 81     PROVIDERS: Olin Hauser, DO is PCP ([facility]) Ida Rogue, MD  is cardiologist Charlaine Dalton, MD is HEM. 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 S/p IV iron. Vonda Antigua, MD is GI. Last visit 09/06/20 for follow-up dysphagia. 06/13/20 EGD which showed a tortuous esophagus, Roux-en-Y gastrojejunostomy with healthy appearing anastomosis.  biopsies negative for EOE. S/p normal esophogeal manometry with manometric appearance consistent with hiatal hernia. 06/13/20 colonoscopy with single hyperplastic sigmoid polyp, cecal diverticulosis, non-bleeding internal hemorrhoids.  Prior neck surgery may be contributing to dysphagia symptoms. Referred to ST. May consider upper GI study if negative ST findings.    LABS: Labs in [facility] as of 11/15/21 include: Lab Results  Component Value Date   WBC 5.1 11/15/2021   HGB 14.5 11/15/2021   HCT 45.0 11/15/2021   PLT 171 11/15/2021   GLUCOSE 94 11/15/2021   CHOL 132 04/28/2021   TRIG 79 04/28/2021   HDL 76 04/28/2021   LDLCALC 40 04/28/2021   ALT 58 (H) 11/15/2021   AST 65 (H) 11/15/2021   NA 143 11/15/2021   K 4.2 11/15/2021   CL 110 11/15/2021   CREATININE 0.69 11/15/2021   BUN 16 11/15/2021   CO2 30 11/15/2021   TSH 3.29 04/28/2021   INR 1.0 11/15/2021   HGBA1C 4.8 04/28/2021     IMAGES: CXR 09/12/21: FINDINGS: Stable cardiac and mediastinal contours. Elevation right hemidiaphragm. No evidence for large area pulmonary consolidation. No pleural effusion or pneumothorax. Thoracic spine degenerative changes. IMPRESSION: Right hemidiaphragm elevation.  No acute process.  CT L-spine 09/01/21: IMPRESSION: 1. Postsurgical changes reflecting posterior instrumented fusion at L3-L4 without evidence of complication and ghost tracks from prior pedicle screws at L5. There is no convincing osseous across the disc spaces at these levels but there is at least partial fusion of the posterior elements bilaterally at L3-L4 and on the right at  L4-L5. 2. 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 Up to moderate to severe spinal canal stenosis and moderate left neural foraminal stenosis at L2-L3 and moderate left and severe right neural foraminal stenosis at L5-S1. 3. Vacuum disc phenomenon at L5-S1.  MRI L-spine 08/15/21 ([facility]/PACS): IMPRESSION: 1. Progressive disc and facet degeneration L2-3 with progressive moderate to severe spinal stenosis. Moderate to severe subarticular stenosis bilaterally left greater than right 2. Pedicle screw fusion L3-4 without stenosis 3. Decompressive laminectomy L4-5 without stenosis 4. Recent right laminectomy L5-S1. Small residual right-sided disc protrusion. Enhancing granulation tissue around the right S1 nerve root without nerve root compression.  [place] Abd (limited) 05/04/20: IMPRESSION: Mild hepatic steatosis. No hepatic mass identified. Prior cholecystectomy. No evidence of biliary ductal dilatation.   EKG: 03/11/32: Sinus rhythm Low voltage, precordial leads   CV: Nuclear stress test 08/08/20: There was no ST segment deviation noted during stress. No T wave inversion was noted during stress. Defect 1: There is a small defect of mild severity present in the apex location likely artifact. The study is normal. This is a low risk study. The left ventricular ejection fraction is normal (55-65%). CT attenuation images shows moderate aortic and coronary calcifications.   CT Cardiac Calcium Scoring 06/06/17: IMPRESSION: - Coronary calcium score of 2456 . This was 48 th percentile for age and sex matched control. - Consider f/u heart catheterization or  perfusion study given how extensive the coronary calcification is    US Carotid 05/23/17: Final Interpretation:  - Right Carotid: Velocities in the right ICA are consistent with a 1-39% stenosis. Non-hemodynamically significant plaque <50% noted in the CCA.  - Left Carotid:  Velocities in the left ICA are consistent with a 1-39% stenosis. Non-hemodynamically significant plaque noted in the CCA.  - Vertebrals:  Bilateral vertebral arteries demonstrate antegrade flow. Subclavians: Normal flow hemodynamics were seen in bilateral subclavian arteries.  - Reviewed by Dr. Rockey Situ,  "minimal carotid disease bilaterally  less than 39% bilaterally No need for repeat ultrasound"   Past Medical History:  Diagnosis Date   Allergy    Anemia    Anxiety    Arthritis    Yes   Back pain    Blood transfusion without reported diagnosis 1977   Transfusions from miscarriage & hemorrhaging   Chronic kidney disease    Depression    Disp fx of cuboid bone of right foot, init for clos fx 01/01/2017   Fibromyalgia    GERD (gastroesophageal reflux disease)    Headache    Hepatitis C    Hyperlipidemia    Lumbar radiculopathy    Neck pain    Neuromuscular disorder ([place]) 1997   Falling to much was an eye-opener   Osteoporosis    Thyroid disease    Urine incontinence     Past Surgical History:  Procedure Laterality Date   5 miscarriages     1977-1985, Blood transfusion s/p miscarriage 1977   ABDOMINAL HYSTERECTOMY  1987   Total   APPENDECTOMY  12/2008   AUGMENTATION MAMMAPLASTY Bilateral 2015   Bilat   AUGMENTATION MAMMAPLASTY Bilateral 2018   implants redone w/ placement of implants under muscle   breast lift bilateral, implants  30/09/2328   bilateral, silicon naturel   BREAST REDUCTION SURGERY Bilateral 1997   CESAREAN SECTION  07/27/1981   Placenta Previa   CHOLECYSTECTOMY  03/2004   Lap surgery   COLONOSCOPY WITH PROPOFOL N/A 06/13/2020   Procedure: COLONOSCOPY WITH PROPOFOL;  Surgeon: Virgel Manifold, MD;  Location: [facility] ENDOSCOPY;  Service: Endoscopy;  Laterality: N/A;   COSMETIC SURGERY     Breast implants w/ lift, tummy tuck, upper & lower Blepharop   ESOPHAGOGASTRODUODENOSCOPY (EGD) WITH PROPOFOL N/A 06/13/2020   Procedure:  ESOPHAGOGASTRODUODENOSCOPY (EGD) WITH PROPOFOL;  Surgeon: Virgel Manifold, MD;  Location: [facility] ENDOSCOPY;  Service: Endoscopy;  Laterality: N/A;   EYE SURGERY  2017   Cataract surgery & lasik   GASTRIC BYPASS  2005   Laparoscopic   IVC FILTER INSERTION  12/2003   TrapEase Vena Cava Filter   LUMBAR LAMINECTOMY  06/2006   L4-L5 (spinal fusion)   mini tummy tuck  05/07/2016   Bilateral bra/back roll lift skin removal   neck fusion     c3-c7 cadavier bones and metal plates placed per patient   neck surgery C3-C7  02/10/2015   ACDF   REDUCTION MAMMAPLASTY Bilateral 1997   SHOULDER ACROMIOPLASTY Left 03/27/2013   w/ labral debridement   SMALL INTESTINE SURGERY  12/24/2003   Lap Gastric Bypass   SPINAL CORD STIMULATOR IMPLANT  11/2010   removed 05/2011   SPINE SURGERY  2008 2017   Fusion L4-L5, ACDF C4-C7    MEDICATIONS: No current facility-administered medications for this encounter.    ASPIRIN LOW DOSE 81 MG tablet   butalbital-acetaminophen-caffeine (FIORICET) 50-325-40 MG tablet   Cholecalciferol 250 MCG (10000 UT) CAPS   cyanocobalamin (VITAMIN B12)  1000 MCG/ML injection   diclofenac Sodium (VOLTAREN) 1 % GEL   estradiol (ESTRACE) 1 MG tablet   etodolac (LODINE) 500 MG tablet   ezetimibe (ZETIA) 10 MG tablet   fluticasone (FLONASE) 50 MCG/ACT nasal spray   furosemide (LASIX) 20 MG tablet   gabapentin (NEURONTIN) 600 MG tablet   hydrOXYzine (ATARAX/VISTARIL) 25 MG tablet   omeprazole (PRILOSEC) 40 MG capsule   ondansetron (ZOFRAN ODT) 4 MG disintegrating tablet   oxyCODONE-acetaminophen (PERCOCET) 10-325 MG tablet   propranolol (INDERAL) 10 MG tablet   rizatriptan (MAXALT-MLT) 10 MG disintegrating tablet   rosuvastatin (CRESTOR) 40 MG tablet   tiZANidine (ZANAFLEX) 4 MG tablet   traZODone (DESYREL) 100 MG tablet   venlafaxine XR (EFFEXOR-XR) 75 MG 24 hr capsule   Zoledronic Acid (ZOMETA) 4 MG/100ML IVPB   amoxicillin-clavulanate (AUGMENTIN) 875-125 MG tablet    trimethoprim-polymyxin b (POLYTRIM) ophthalmic solution   valACYclovir (VALTREX) 1000 MG tablet    Myra Gianotti, PA-C Surgical Short Stay/Anesthesiology [facility] Phone [phone] [facility] Phone [phone] 11/21/2021 11:11 AM

## 2021-11-19 ENCOUNTER — Other Ambulatory Visit: Payer: Self-pay | Admitting: Family Medicine

## 2021-11-19 DIAGNOSIS — K219 Gastro-esophageal reflux disease without esophagitis: Secondary | ICD-10-CM

## 2021-11-20 NOTE — Telephone Encounter (Signed)
Refilled 07/27/2021 #90 1 rf - confirmed by same pharmacy. Requested Prescriptions  Pending Prescriptions Disp Refills   omeprazole (PRILOSEC) 40 MG capsule [Pharmacy Med Name: OMEPRAZOLE DR 40 MG CAPSULE] 90 capsule 1    Sig: TAKE 1 CAPSULE BY MOUTH EVERY DAY     Gastroenterology: Proton Pump Inhibitors Passed - 11/19/2021  9:10 AM      Passed - Valid encounter within last 12 months    Recent Outpatient Visits           1 week ago Hordeolum internum of right upper eyelid   [place], DO   2 months ago Hearing loss, unspecified hearing loss type, unspecified laterality   [facility] [place], Devonne Doughty, DO   4 months ago Spinal stenosis of lumbar region without neurogenic claudication   [place], DO   6 months ago Annual physical exam   [place], DO   8 months ago Migraine without aura and with status migrainosus, not intractable   [place], [place]

## 2021-11-21 ENCOUNTER — Telehealth: Payer: Self-pay | Admitting: Cardiovascular Disease

## 2021-11-21 NOTE — Telephone Encounter (Signed)
   Pre-operative Risk Assessment    Patient Name: Ashley Fields  DOB: May 22, 1955 MRN: [id number]      Request for Surgical Clearance{   Procedure:   L5-S1 LUMBAR FUSION  Date of Surgery:  Clearance 12/18/21                               Surgeon:  DR Sherley Bounds Surgeon's Group or Practice Name:  [facility] Phone number:  [phone] Fax number:  [phone]  Type of Clearance Requested:   - Medical    Type of Anesthesia:  General    Additional requests/questions:    Signed, Eli Phillips   11/21/2021, 3:10 PM

## 2021-11-21 NOTE — Telephone Encounter (Signed)
   Name: Ashley Fields  DOB: September 27, 1955  MRN: [id number]  Primary Cardiologist: None  Chart reviewed as part of pre-operative protocol coverage. Because of Ashley Fields's past medical history and time since last visit, she will require a follow-up telephone visit in order to better assess preoperative cardiovascular risk.  Pre-op covering staff: - Please schedule appointment and call patient to inform them. If patient already had an upcoming appointment within acceptable timeframe, please add "pre-op clearance" to the appointment notes so provider is aware. - Please contact requesting surgeon's office via preferred method (i.e, phone, fax) to inform them of need for appointment prior to surgery.  No medications indicated as needing held.   Elgie Collard, PA-C  11/21/2021, 4:51 PM

## 2021-11-22 ENCOUNTER — Other Ambulatory Visit: Payer: Self-pay | Admitting: Family Medicine

## 2021-11-22 ENCOUNTER — Telehealth: Payer: Self-pay | Admitting: *Deleted

## 2021-11-22 ENCOUNTER — Other Ambulatory Visit: Payer: Self-pay | Admitting: Cardiovascular Disease

## 2021-11-22 ENCOUNTER — Other Ambulatory Visit: Payer: Self-pay | Admitting: Psychiatry

## 2021-11-22 DIAGNOSIS — B379 Candidiasis, unspecified: Secondary | ICD-10-CM

## 2021-11-22 DIAGNOSIS — Z79899 Other long term (current) drug therapy: Secondary | ICD-10-CM | POA: Diagnosis not present

## 2021-11-22 DIAGNOSIS — M533 Sacrococcygeal disorders, not elsewhere classified: Secondary | ICD-10-CM | POA: Diagnosis not present

## 2021-11-22 DIAGNOSIS — E782 Mixed hyperlipidemia: Secondary | ICD-10-CM

## 2021-11-22 DIAGNOSIS — M545 Low back pain, unspecified: Secondary | ICD-10-CM | POA: Diagnosis not present

## 2021-11-22 DIAGNOSIS — M5416 Radiculopathy, lumbar region: Secondary | ICD-10-CM | POA: Diagnosis not present

## 2021-11-22 DIAGNOSIS — M25552 Pain in left hip: Secondary | ICD-10-CM | POA: Diagnosis not present

## 2021-11-22 DIAGNOSIS — M479 Spondylosis, unspecified: Secondary | ICD-10-CM | POA: Diagnosis not present

## 2021-11-22 DIAGNOSIS — M47896 Other spondylosis, lumbar region: Secondary | ICD-10-CM | POA: Diagnosis not present

## 2021-11-22 DIAGNOSIS — M48061 Spinal stenosis, lumbar region without neurogenic claudication: Secondary | ICD-10-CM | POA: Diagnosis not present

## 2021-11-22 DIAGNOSIS — G894 Chronic pain syndrome: Secondary | ICD-10-CM | POA: Diagnosis not present

## 2021-11-22 DIAGNOSIS — G4701 Insomnia due to medical condition: Secondary | ICD-10-CM

## 2021-11-22 NOTE — Telephone Encounter (Signed)
I returned the pt's call and left a message.

## 2021-11-22 NOTE — Telephone Encounter (Signed)
Pt called back and has been scheduled for her tele pre op appt 12/11/21 @ 9 am. Med rec and consent are done.     Patient Consent for Virtual Visit        Ashley Fields has provided verbal consent on 11/22/2021 for a virtual visit (video or telephone).   CONSENT FOR VIRTUAL VISIT FOR:  Ashley Fields  By participating in this virtual visit I agree to the following:  I hereby voluntarily request, consent and authorize [facility] and its employed or contracted physicians, physician assistants, nurse practitioners or other licensed health care professionals (the Practitioner), to provide me with telemedicine health care services (the "Services") as deemed necessary by the treating Practitioner. I acknowledge and consent to receive the Services by the Practitioner via telemedicine. I understand that the telemedicine visit will involve communicating with the Practitioner through live audiovisual communication technology and the disclosure of certain medical information by electronic transmission. I acknowledge that I have been given the opportunity to request an in-person assessment or other available alternative prior to the telemedicine visit and am voluntarily participating in the telemedicine visit.  I understand that I have the right to withhold or withdraw my consent to the use of telemedicine in the course of my care at any time, without affecting my right to future care or treatment, and that the Practitioner or I may terminate the telemedicine visit at any time. I understand that I have the right to inspect all information obtained and/or recorded in the course of the telemedicine visit and may receive copies of available information for a reasonable fee.  I understand that some of the potential risks of receiving the Services via telemedicine include:  Delay or interruption in medical evaluation due to technological equipment failure or disruption; Information transmitted may not be  sufficient (e.g. poor resolution of images) to allow for appropriate medical decision making by the Practitioner; and/or  In rare instances, security protocols could fail, causing a breach of personal health information.  Furthermore, I acknowledge that it is my responsibility to provide information about my medical history, conditions and care that is complete and accurate to the best of my ability. I acknowledge that Practitioner's advice, recommendations, and/or decision may be based on factors not within their control, such as incomplete or inaccurate data provided by me or distortions of diagnostic images or specimens that may result from electronic transmissions. I understand that the practice of medicine is not an exact science and that Practitioner makes no warranties or guarantees regarding treatment outcomes. I acknowledge that a copy of this consent can be made available to me via my patient portal ([facility]), or I can request a printed copy by calling the office of [facility].    I understand that my insurance will be billed for this visit.   I have read or had this consent read to me. I understand the contents of this consent, which adequately explains the benefits and risks of the Services being provided via telemedicine.  I have been provided ample opportunity to ask questions regarding this consent and the Services and have had my questions answered to my satisfaction. I give my informed consent for the services to be provided through the use of telemedicine in my medical care

## 2021-11-22 NOTE — Telephone Encounter (Signed)
Requested Prescriptions  Pending Prescriptions Disp Refills   fluconazole (DIFLUCAN) 150 MG tablet [Pharmacy Med Name: FLUCONAZOLE 150 MG TABLET] 2 tablet 0    Sig: TAKE ONE TABLET BY MOUTH ON DAY 1. REPEAT DOSE 2ND TABLET ON DAY 3.     Off-Protocol Failed - 11/22/2021  9:21 AM      Failed - Medication not assigned to a protocol, review manually.      Passed - Valid encounter within last 12 months    Recent Outpatient Visits           1 week ago Hordeolum internum of right upper eyelid   [place], DO   2 months ago Hearing loss, unspecified hearing loss type, unspecified laterality   [facility] [place], Devonne Doughty, DO   4 months ago Spinal stenosis of lumbar region without neurogenic claudication   [place], DO   6 months ago Annual physical exam   [place], DO   8 months ago Migraine without aura and with status migrainosus, not intractable   [place], [place]

## 2021-11-22 NOTE — Telephone Encounter (Signed)
Pt called back and has been scheduled for her tele pre op appt 12/11/21 @ 9 am. Med rec and consent are done.

## 2021-11-22 NOTE — Telephone Encounter (Signed)
Patient is returning call.  °

## 2021-11-22 NOTE — Telephone Encounter (Signed)
Primary card is Dr. Rockey Situ. Left message to call back to set up tele pre op appt.

## 2021-11-27 ENCOUNTER — Other Ambulatory Visit: Payer: Self-pay

## 2021-11-28 DIAGNOSIS — M542 Cervicalgia: Secondary | ICD-10-CM | POA: Diagnosis not present

## 2021-12-01 ENCOUNTER — Other Ambulatory Visit: Payer: Self-pay | Admitting: Family Medicine

## 2021-12-01 ENCOUNTER — Other Ambulatory Visit: Payer: Self-pay | Admitting: Cardiovascular Disease

## 2021-12-01 ENCOUNTER — Encounter: Payer: Self-pay | Admitting: Family Medicine

## 2021-12-01 DIAGNOSIS — E041 Nontoxic single thyroid nodule: Secondary | ICD-10-CM

## 2021-12-01 DIAGNOSIS — E782 Mixed hyperlipidemia: Secondary | ICD-10-CM

## 2021-12-01 DIAGNOSIS — B001 Herpesviral vesicular dermatitis: Secondary | ICD-10-CM

## 2021-12-04 ENCOUNTER — Other Ambulatory Visit: Payer: Self-pay | Admitting: Psychiatry

## 2021-12-04 DIAGNOSIS — E041 Nontoxic single thyroid nodule: Secondary | ICD-10-CM | POA: Insufficient documentation

## 2021-12-04 DIAGNOSIS — F4310 Post-traumatic stress disorder, unspecified: Secondary | ICD-10-CM

## 2021-12-04 NOTE — Telephone Encounter (Signed)
Requested medications are due for refill today.  yes  Requested medications are on the active medications list.  yes  Last refill. 11/09/2021 #30 0 rf  Future visit scheduled.   no  Notes to clinic.  Pharmacy comment: REQUEST FOR 90 DAYS PRESCRIPTION. DX Code Needed.     Requested Prescriptions  Pending Prescriptions Disp Refills   valACYclovir (VALTREX) 1000 MG tablet [Pharmacy Med Name: VALACYCLOVIR HCL 1 GRAM TABLET] 90 tablet 1    Sig: Take 1 tablet (1,000 mg total) by mouth daily. For 5-7 days, may repeat if need for cold sore     Antimicrobials:  Antiviral Agents - Anti-Herpetic Passed - 12/01/2021  8:32 AM      Passed - Valid encounter within last 12 months    Recent Outpatient Visits           3 weeks ago Hordeolum internum of right upper eyelid   [place], DO   2 months ago Hearing loss, unspecified hearing loss type, unspecified laterality   [place], DO   4 months ago Spinal stenosis of lumbar region without neurogenic claudication   [place], DO   7 months ago Annual physical exam   [place], DO   8 months ago Migraine without aura and with status migrainosus, not intractable   [place], [place]

## 2021-12-07 IMAGING — DX DG CERVICAL SPINE COMPLETE 4+V
4 series · 4 of 4 positions shown · non-contrast
Comparison: 08/16/2016

CLINICAL DATA: Fall, chronic neck pain, worse, history of anterior
cervical discectomy and fusion C4 through C7

EXAM:
CERVICAL SPINE - COMPLETE 4+ VIEW

[c-spine ap]
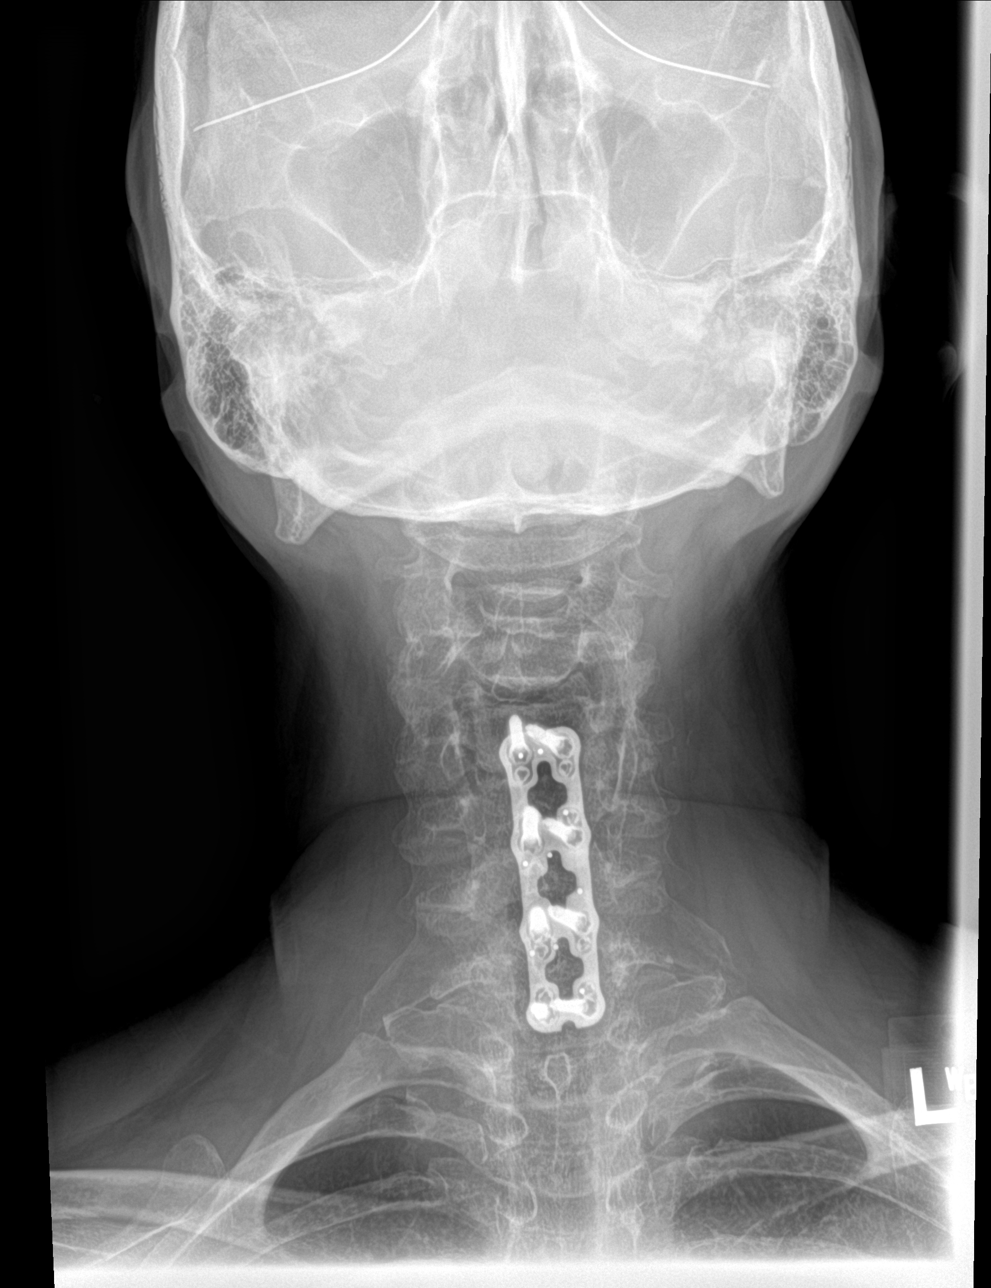

[c-spine open mouth (1 of 2)]
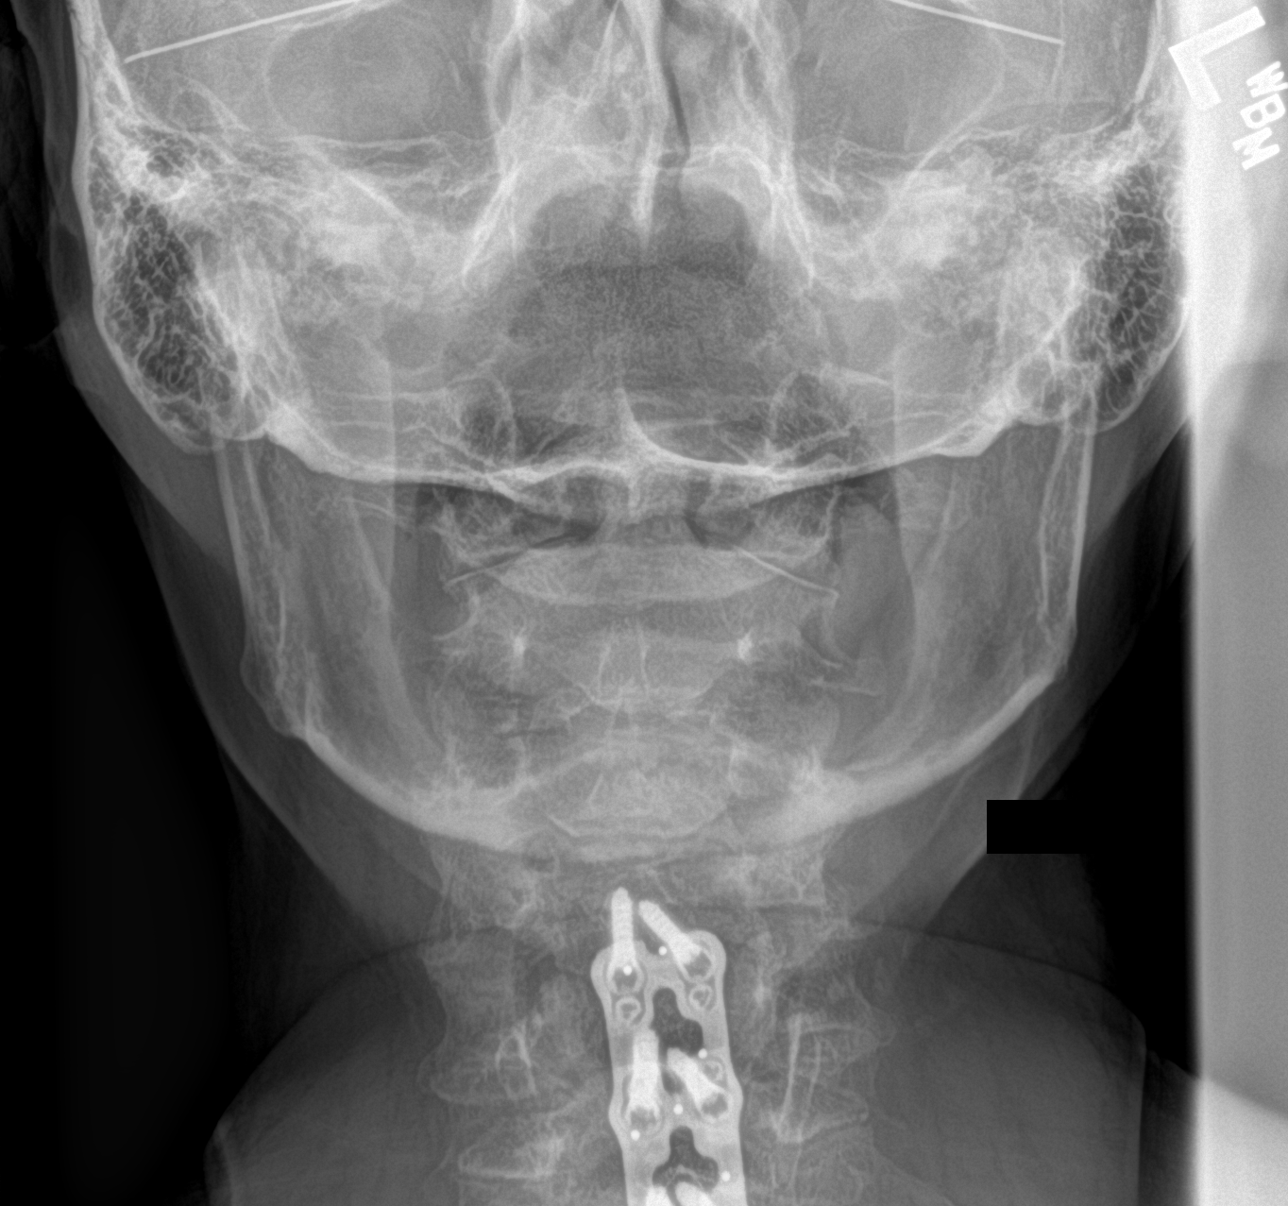

[[person_name]]
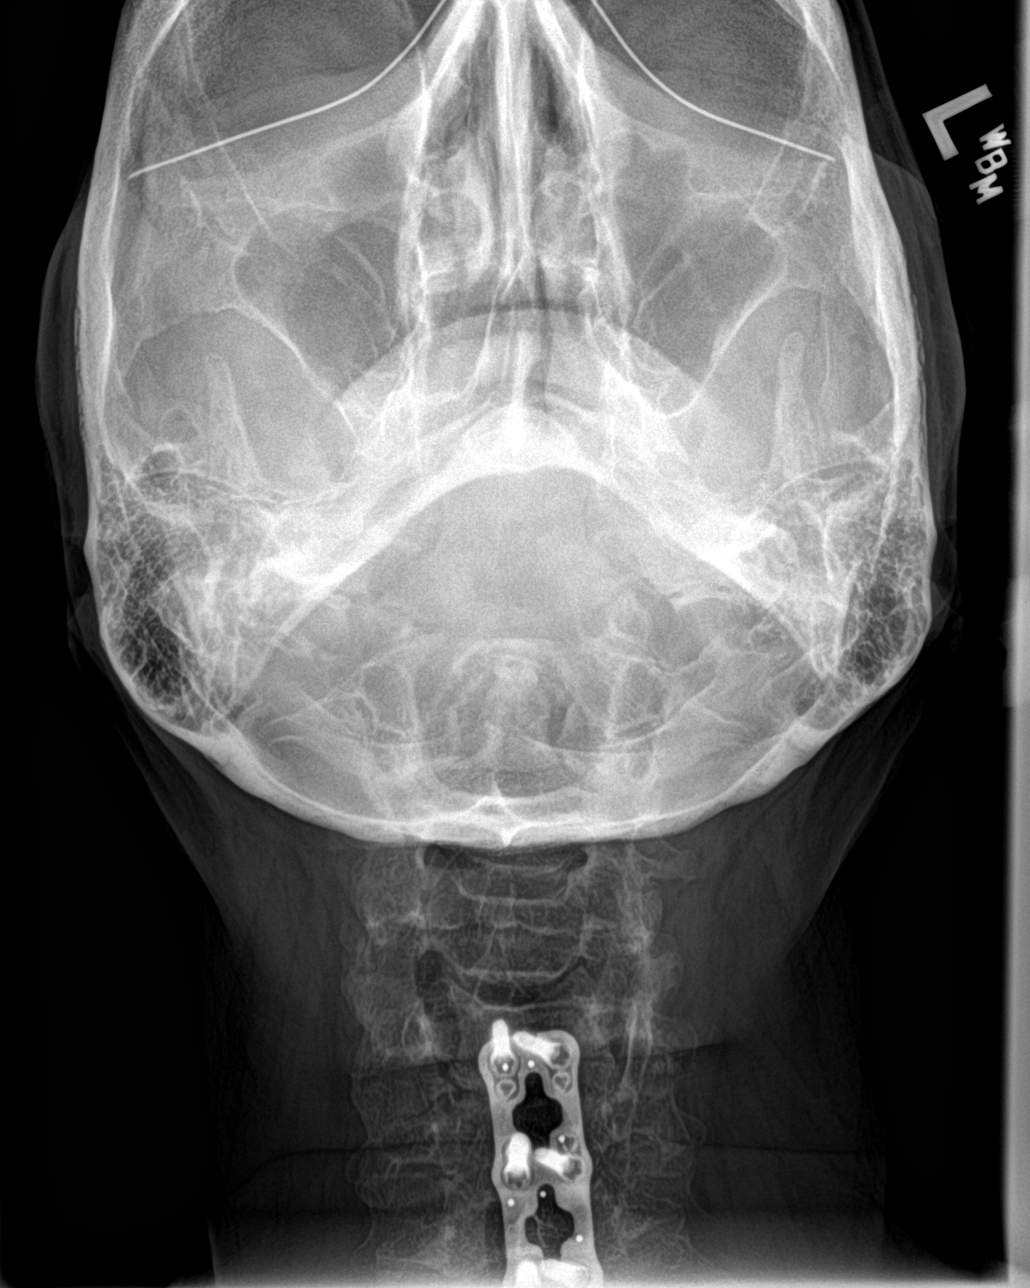

[c-spine open mouth (2 of 2)]
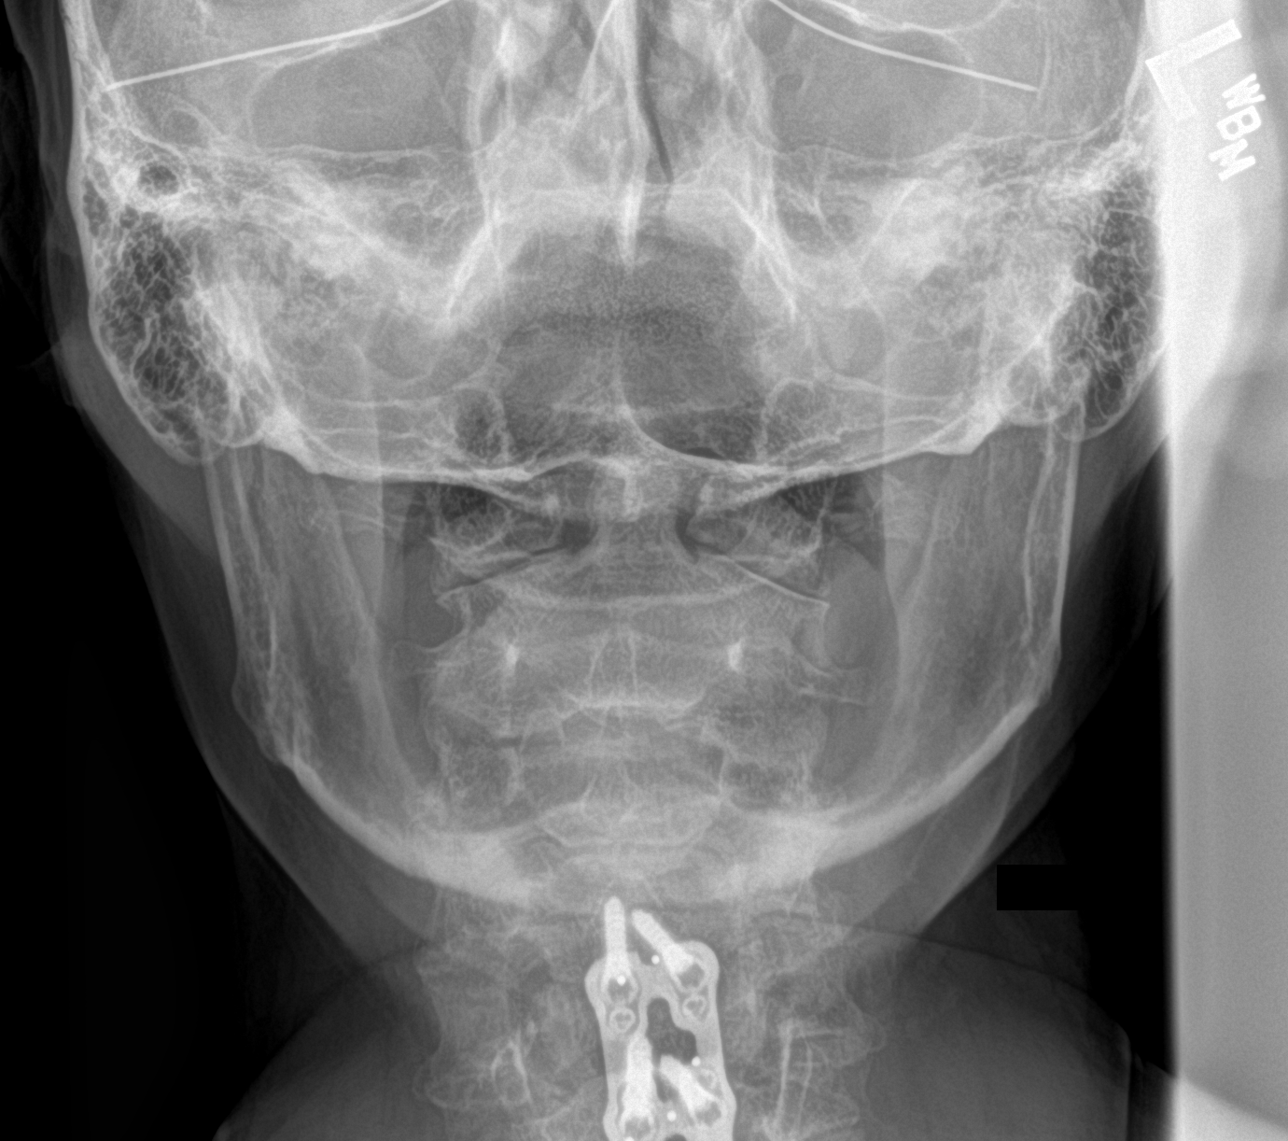

[4 of 4 positions shown; findings below may reference images not displayed]

FINDINGS: No fracture or static subluxation of the cervical spine.
Redemonstrated postoperative findings of anterior cervical
discectomy and fusion from C4 through C7. No evidence of
perihardware fracture or loosening. There is moderate disc space
height loss and osteophytosis at C3-C4. Remaining disc levels are
intact. No significant bony neural foraminal stenosis. Partially
imaged skull base, cervical soft tissues, and upper chest are
unremarkable.
IMPRESSION: 1. No fracture or static subluxation of the cervical spine.
2. Redemonstrated postoperative findings of anterior cervical
discectomy and fusion from C4 through C7. No evidence of
perihardware fracture or loosening. There is moderate disc space
height loss and osteophytosis at C3-C4. Remaining disc levels are
intact.

## 2021-12-11 ENCOUNTER — Ambulatory Visit: Payer: Medicare HMO | Attending: Cardiology | Admitting: Physician Assistant

## 2021-12-11 VITALS — BP 112/72 | HR 73

## 2021-12-11 DIAGNOSIS — Z01810 Encounter for preprocedural cardiovascular examination: Secondary | ICD-10-CM

## 2021-12-11 NOTE — Progress Notes (Signed)
Virtual Visit via Telephone Note   Because of Ashley Fields's co-morbid illnesses, she is at least at moderate risk for complications without adequate follow up.  This format is felt to be most appropriate for this patient at this time.  The patient did not have access to video technology/had technical difficulties with video requiring transitioning to audio format only (telephone).  All issues noted in this document were discussed and addressed.  No physical exam could be performed with this format.  Please refer to the patient's chart for her consent to telehealth for [facility].  Evaluation Performed:  Preoperative cardiovascular risk assessment _____________   Date:  12/11/2021   Patient ID:  Ashley Fields, DOB [birth year], MRN [id number] Patient Location:  Home Provider location:   Office  Primary Care Provider:  Olin Hauser, DO Primary Cardiologist:  None  Chief Complaint / Patient Profile   66 y.o. y/o female with a h/o coronary calcification by CT, aortic atherosclerosis, bipolar disorder, prior tobacco abuse, HLD, gastric bypass, Hollenhorst plaque R eye, mild carotid artery disease, arthritis, depression, GERD, hepatitis C who is pending L5-S1 LUMBAR FUSION and presents today for telephonic preoperative cardiovascular risk assessment.  History of Present Illness    Ashley Fields is a 66 y.o. female who presents via audio/video conferencing for a telehealth visit today.  Pt was last seen in cardiology clinic on 02/01/21 by Dr. Rockey Situ. She has a history of coronary calcification on CT scan with negative stress test in 2019 and August 2022. At that time Ashley Fields was doing well.  The patient is now pending procedure as outlined above. Since her last visit, she reports she has been doing well without any new angina or dyspnea. 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  Past Medical History    Past Medical History:  Diagnosis Date   Allergy     Anemia    Anxiety    Arthritis    Yes   Back pain    Blood transfusion without reported diagnosis 1977   Transfusions from miscarriage & hemorrhaging   Chronic kidney disease    Depression    Disp fx of cuboid bone of right foot, init for clos fx 01/01/2017   Fibromyalgia    GERD (gastroesophageal reflux disease)    Headache    Hepatitis C    Hyperlipidemia    Lumbar radiculopathy    Neck pain    Neuromuscular disorder ([place]) 1997   Falling to much was an eye-opener   Osteoporosis    Thyroid disease    Urine incontinence    Past Surgical History:  Procedure Laterality Date   5 miscarriages     1977-1985, Blood transfusion s/p miscarriage 1977   ABDOMINAL HYSTERECTOMY  1987   Total   APPENDECTOMY  12/2008   AUGMENTATION MAMMAPLASTY Bilateral 2015   Bilat   AUGMENTATION MAMMAPLASTY Bilateral 2018   implants redone w/ placement of implants under muscle   breast lift bilateral, implants  27/51/7001   bilateral, silicon naturel   BREAST REDUCTION SURGERY Bilateral 1997   CESAREAN SECTION  07/27/1981   Placenta Previa   CHOLECYSTECTOMY  03/2004   Lap surgery   COLONOSCOPY WITH PROPOFOL N/A 06/13/2020   Procedure: COLONOSCOPY WITH PROPOFOL;  Surgeon: Virgel Manifold, MD;  Location: [facility] ENDOSCOPY;  Service: Endoscopy;  Laterality: N/A;   COSMETIC SURGERY     Breast implants w/ lift, tummy tuck, upper & lower Blepharop   ESOPHAGOGASTRODUODENOSCOPY (EGD) WITH  PROPOFOL N/A 06/13/2020   Procedure: ESOPHAGOGASTRODUODENOSCOPY (EGD) WITH PROPOFOL;  Surgeon: Virgel Manifold, MD;  Location: [facility] ENDOSCOPY;  Service: Endoscopy;  Laterality: N/A;   EYE SURGERY  2017   Cataract surgery & lasik   GASTRIC BYPASS  2005   Laparoscopic   IVC FILTER INSERTION  12/2003   TrapEase Vena Cava Filter   LUMBAR LAMINECTOMY  06/2006   L4-L5 (spinal fusion)   mini tummy tuck  05/07/2016   Bilateral bra/back roll lift skin removal   neck fusion     c3-c7 cadavier bones and metal  plates placed per patient   neck surgery C3-C7  02/10/2015   ACDF   REDUCTION MAMMAPLASTY Bilateral 1997   SHOULDER ACROMIOPLASTY Left 03/27/2013   w/ labral debridement   SMALL INTESTINE SURGERY  12/24/2003   Lap Gastric Bypass   SPINAL CORD STIMULATOR IMPLANT  11/2010   removed 05/2011   SPINE SURGERY  2008 2017   Fusion L4-L5, ACDF C4-C7    Allergies  Allergies  Allergen Reactions   Flagyl [Metronidazole] Anaphylaxis   Cymbalta [Duloxetine Hcl] Anxiety   Tape Rash    Home Medications    Prior to Admission medications   Medication Sig Start Date End Date Taking? Authorizing Provider  amoxicillin-clavulanate (AUGMENTIN) 875-125 MG tablet Take 1 tablet by mouth 2 (two) times daily. Patient not taking: Reported on 11/15/2021 11/09/21   Olin Hauser, DO  ASPIRIN LOW DOSE 81 MG tablet TAKE 1 TABLET BY MOUTH EVERY DAY Patient taking differently: Take 81 mg by mouth daily. 08/21/21   Karamalegos, Devonne Doughty, DO  bisacodyl (DULCOLAX) 5 MG EC tablet Take 5 mg by mouth daily as needed for moderate constipation.    [provider]  butalbital-acetaminophen-caffeine (FIORICET) 50-325-40 MG tablet Take 1 tablet by mouth 2 (two) times daily as needed for headache.    [provider]  Cholecalciferol 250 MCG (10000 UT) CAPS Take 10,000 Units by mouth once a week.    [provider]  cyanocobalamin (VITAMIN B12) 1000 MCG/ML injection Inject 1 mL (1,000 mcg total) into the muscle every 30 (thirty) days. 10/09/21   Karamalegos, Devonne Doughty, DO  diclofenac Sodium (VOLTAREN) 1 % GEL Apply 2 g topically 4 (four) times daily as needed (pain). 10/07/19   [provider]  estradiol (ESTRACE) 1 MG tablet TAKE 1 TABLET EVERY DAY Patient taking differently: Take 1 mg by mouth daily. 06/22/21   Karamalegos, Devonne Doughty, DO  etodolac (LODINE) 500 MG tablet Take 1 tablet by mouth 2 (two) times daily. 08/28/21   [provider]  ezetimibe (ZETIA) 10 MG  tablet TAKE 1 TABLET BY MOUTH ONCE DAILY 11/08/21   Karamalegos, Devonne Doughty, DO  fluticasone (FLONASE) 50 MCG/ACT nasal spray SPRAY 2 SPRAYS INTO EACH NOSTRIL EVERY DAY Patient taking differently: Place 2 sprays into both nostrils daily. 10/23/21   Karamalegos, Devonne Doughty, DO  furosemide (LASIX) 20 MG tablet Take 1 tablet (20 mg total) by mouth daily as needed for fluid or edema. 06/22/21   Minna Merritts, MD  gabapentin (NEURONTIN) 600 MG tablet Take 1 tablet (600 mg total) by mouth in the morning, at noon, in the evening, and at bedtime. 11/09/21   Karamalegos, Devonne Doughty, DO  hydrOXYzine (ATARAX/VISTARIL) 25 MG tablet TAKE 1/2 TO 1 TABLET BY MOUTH TWICE A DAY AS NEEDED FOR SEVERE PANIC ATTACKS/ ANXIETY Patient taking differently: Take 12.5-25 mg by mouth 2 (two) times daily as needed for anxiety. 07/21/20   Ursula Alert, MD  omeprazole (PRILOSEC) 40 MG capsule TAKE 1 CAPSULE BY MOUTH EVERY DAY Patient taking differently: Take 40 mg by mouth daily. 07/27/21   Karamalegos, Devonne Doughty, DO  ondansetron (ZOFRAN ODT) 4 MG disintegrating tablet Take 1 tablet (4 mg total) by mouth every 8 (eight) hours as needed for nausea or vomiting. Patient not taking: Reported on 11/22/2021 12/16/18   Olin Hauser, DO  oxyCODONE-acetaminophen (PERCOCET) 10-325 MG tablet Take 1 tablet by mouth 4 (four) times daily.    [provider]  propranolol (INDERAL) 10 MG tablet Take 1 tablet (10 mg total) by mouth 3 (three) times daily as needed. For anxiety attacks 10/24/21   Ursula Alert, MD  rizatriptan (MAXALT-MLT) 10 MG disintegrating tablet Take 1 tablet (10 mg total) by mouth as needed for migraine. May repeat in 2 hours if needed, that is max dose for 24 hours. 03/14/21   Karamalegos, Devonne Doughty, DO  rosuvastatin (CRESTOR) 40 MG tablet TAKE 1 TABLET BY MOUTH EVERY DAY 11/22/21   Minna Merritts, MD  tiZANidine (ZANAFLEX) 4 MG tablet Take 1 tablet (4 mg total) by mouth 4 (four) times daily.  09/18/21   Karamalegos, Devonne Doughty, DO  traZODone (DESYREL) 100 MG tablet TAKE 1 TABLET BY MOUTH EVERYDAY AT BEDTIME 11/22/21   Ursula Alert, MD  trimethoprim-polymyxin b (POLYTRIM) ophthalmic solution Place 1 drop into the right eye every 6 (six) hours. 11/09/21   Karamalegos, Devonne Doughty, DO  valACYclovir (VALTREX) 1000 MG tablet TAKE 1 TABLET (1,000 MG TOTAL) BY MOUTH DAILY. FOR 5-7 DAYS, MAY REPEAT IF NEED FOR COLD SORE 12/04/21   Par[place], Devonne Doughty, DO  venlafaxine XR (EFFEXOR-XR) 75 MG 24 hr capsule TAKE ONE CAPSULE BY MOUTH DAILY WITH BREAKFAST Patient taking differently: Take 75 mg by mouth daily with breakfast. 06/22/21   Ursula Alert, MD  Zoledronic Acid (ZOMETA) 4 MG/100ML IVPB Once per year from Orthopedic 05/05/21   Olin Hauser, DO    Physical Exam    Vital Signs: BP 112/72   Pulse 73   Given telephonic nature of communication, physical exam is limited. AAOx3. NAD. Normal affect.  Speech and respirations are unlabored.  Accessory Clinical Findings    None  Assessment & Plan    1.  Preoperative Cardiovascular Risk Assessment: RCRI 0.9% indicating low CV risk. The patient affirms she has been doing well without any new cardiac symptoms. They are able to achieve over 4 METS without cardiac limitations. Therefore, based on ACC/AHA guidelines, the patient would be at acceptable risk for the planned procedure without further cardiovascular testing. The patient was advised that if she develops new symptoms prior to surgery to contact our office to arrange for a follow-up visit, and she verbalized understanding. The patient has no history of MI, PCI or CABG therefore no acute contraindication to holding aspirin if needed for surgery. She reports she has already been holding this per instructions from surgery team.  A copy of this note will be routed to requesting surgeon.  Time:   Today, I have spent 5 minutes with the patient with telehealth technology  discussing medical history, symptoms, and management plan.     Charlie Pitter, PA-C  12/11/2021, 9:09 AM

## 2021-12-11 NOTE — Anesthesia Preprocedure Evaluation (Addendum)
Anesthesia Evaluation  Patient identified by MRN, date of birth, ID band Patient awake    Reviewed: Allergy & Precautions, NPO status , Patient's Chart, lab work & pertinent test results  Airway Mallampati: I  TM Distance: >3 FB Neck ROM: Full    Dental no notable dental hx. (+) Upper Dentures, Lower Dentures, Dental Advisory Given   Pulmonary former smoker   Pulmonary exam normal breath sounds clear to auscultation       Cardiovascular + angina (no recent chest pain)  + CAD  Normal cardiovascular exam Rhythm:Regular Rate:Normal  08/2020 Myoview rative & Impression  There was no ST segment deviation noted during stress.  No T wave inversion was noted during stress.  Defect 1: There is a small defect of mild severity present in the apex location likely artifact.  The study is normal.  This is a low risk study.  The left ventricular ejection fraction is normal (55-65%).  CT attenuation images shows moderate aortic and coronary calcifications.       Neuro/Psych   Anxiety Depression     Neuromuscular disease    GI/Hepatic ,GERD  Medicated and Controlled,,(+) Hepatitis -, CTreated w harvoni   Endo/Other    Renal/GU Renal InsufficiencyRenal diseaseLab Results      Component                Value               Date                      CREATININE               0.69                11/15/2021                BUN                      16                  11/15/2021                NA                       143                 11/15/2021                K                        4.2                 11/15/2021                    Musculoskeletal  (+) Arthritis ,  Fibromyalgia -  Abdominal  (+) + obese (BMI 32.6)  Peds  Hematology  (+) Blood dyscrasia Lab Results      Component                Value               Date                            HGB  14.5                11/15/2021                HCT                       45.0                11/15/2021                PLT                      171                 11/15/2021              Anesthesia Other Findings ALL: Flagyl, Cymbalta  Reproductive/Obstetrics                             Anesthesia Physical Anesthesia Plan  ASA: 3  Anesthesia Plan: General   Post-op Pain Management: Ketamine IV*, Minimal or no pain anticipated, Ofirmev IV (intra-op)* and Dilaudid IV   Induction: Intravenous  PONV Risk Score and Plan: 4 or greater and Treatment may vary due to age or medical condition, Ondansetron, Midazolam and Dexamethasone  Airway Management Planned: Oral ETT  Additional Equipment: Arterial line  Intra-op Plan:   Post-operative Plan: Extubation in OR  Informed Consent: I have reviewed the patients History and Physical, chart, labs and discussed the procedure including the risks, benefits and alternatives for the proposed anesthesia with the patient or authorized representative who has indicated his/her understanding and acceptance.     Dental advisory given  Plan Discussed with:   Anesthesia Plan Comments: (PAT note written by Myra Gianotti, PA-C. [place] w [facility]  )       Anesthesia Quick Evaluation

## 2021-12-15 ENCOUNTER — Encounter (HOSPITAL_COMMUNITY): Payer: Self-pay | Admitting: Neurological Surgery

## 2021-12-15 NOTE — Progress Notes (Signed)
PCP - Nobie Putnam, DO Cardiologist - n/a  Chest x-ray - 09/13/21 (2V) EKG - 03/13/21 Stress Test - 08/08/20 ECHO - 08/08/20 Cardiac Cath - n/a  ICD Pacemaker/Loop - n/a  Sleep Study -  n/a CPAP - none  Aspirin Instructions: Follow your surgeon's instructions on when to stop aspirin prior to surgery,  If no instructions were given by your surgeon then you will need to call the office for those instructions.  Anesthesia review: Janan Ridge, PA.  No repeat of labs needed on DOS.  Labs drawn on 11/15/21 are okay for 12/18/21 surgery per Ebony Hail.  STOP now taking any Aspirin (unless otherwise instructed by your surgeon), Aleve, Naproxen, Ibuprofen, Motrin, Advil, Goody's, BC's, all herbal medications, fish oil, and all vitamins.   Coronavirus Screening Do you have any of the following symptoms:  Cough yes/no: No Fever (>100.61F)  yes/no: No Runny nose yes/no: No Sore throat yes/no: No Difficulty breathing/shortness of breath  yes/no: No  Have you traveled in the last 14 days and where? yes/no: No  Patient verbalized understanding of instructions that were given via phone.

## 2021-12-18 ENCOUNTER — Other Ambulatory Visit: Payer: Self-pay

## 2021-12-18 ENCOUNTER — Inpatient Hospital Stay (HOSPITAL_COMMUNITY): Payer: Medicare HMO | Admitting: Vascular Surgery

## 2021-12-18 ENCOUNTER — Inpatient Hospital Stay (HOSPITAL_COMMUNITY)
Admission: RE | Admit: 2021-12-18 | Discharge: 2021-12-20 | DRG: 454 | Disposition: A | Discharge status: Home / Self Care | Payer: Medicare HMO | Attending: Neurological Surgery | Admitting: Neurological Surgery

## 2021-12-18 ENCOUNTER — Inpatient Hospital Stay (HOSPITAL_COMMUNITY): Payer: Medicare HMO

## 2021-12-18 ENCOUNTER — Encounter (HOSPITAL_COMMUNITY)
Admission: RE | Disposition: A | Discharge status: Home / Self Care | Payer: Self-pay | Source: Home / Self Care | Attending: Neurological Surgery

## 2021-12-18 ENCOUNTER — Encounter (HOSPITAL_COMMUNITY): Payer: Self-pay | Admitting: Neurological Surgery

## 2021-12-18 DIAGNOSIS — Z79890 Hormone replacement therapy (postmenopausal): Secondary | ICD-10-CM

## 2021-12-18 DIAGNOSIS — M4316 Spondylolisthesis, lumbar region: Secondary | ICD-10-CM | POA: Diagnosis not present

## 2021-12-18 DIAGNOSIS — Z833 Family history of diabetes mellitus: Secondary | ICD-10-CM | POA: Diagnosis not present

## 2021-12-18 DIAGNOSIS — Z8619 Personal history of other infectious and parasitic diseases: Secondary | ICD-10-CM

## 2021-12-18 DIAGNOSIS — Z801 Family history of malignant neoplasm of trachea, bronchus and lung: Secondary | ICD-10-CM | POA: Diagnosis not present

## 2021-12-18 DIAGNOSIS — Z79899 Other long term (current) drug therapy: Secondary | ICD-10-CM

## 2021-12-18 DIAGNOSIS — E785 Hyperlipidemia, unspecified: Secondary | ICD-10-CM | POA: Diagnosis present

## 2021-12-18 DIAGNOSIS — M7138 Other bursal cyst, other site: Secondary | ICD-10-CM

## 2021-12-18 DIAGNOSIS — Z8349 Family history of other endocrine, nutritional and metabolic diseases: Secondary | ICD-10-CM

## 2021-12-18 DIAGNOSIS — I25119 Atherosclerotic heart disease of native coronary artery with unspecified angina pectoris: Secondary | ICD-10-CM

## 2021-12-18 DIAGNOSIS — Z823 Family history of stroke: Secondary | ICD-10-CM | POA: Diagnosis not present

## 2021-12-18 DIAGNOSIS — Z818 Family history of other mental and behavioral disorders: Secondary | ICD-10-CM

## 2021-12-18 DIAGNOSIS — Z8249 Family history of ischemic heart disease and other diseases of the circulatory system: Secondary | ICD-10-CM

## 2021-12-18 DIAGNOSIS — H34211 Partial retinal artery occlusion, right eye: Secondary | ICD-10-CM | POA: Diagnosis present

## 2021-12-18 DIAGNOSIS — K219 Gastro-esophageal reflux disease without esophagitis: Secondary | ICD-10-CM | POA: Diagnosis present

## 2021-12-18 DIAGNOSIS — M5117 Intervertebral disc disorders with radiculopathy, lumbosacral region: Secondary | ICD-10-CM | POA: Diagnosis not present

## 2021-12-18 DIAGNOSIS — F32A Depression, unspecified: Secondary | ICD-10-CM | POA: Diagnosis not present

## 2021-12-18 DIAGNOSIS — Y658 Other specified misadventures during surgical and medical care: Secondary | ICD-10-CM | POA: Diagnosis not present

## 2021-12-18 DIAGNOSIS — M48061 Spinal stenosis, lumbar region without neurogenic claudication: Secondary | ICD-10-CM

## 2021-12-18 DIAGNOSIS — Z83719 Family history of colon polyps, unspecified: Secondary | ICD-10-CM | POA: Diagnosis not present

## 2021-12-18 DIAGNOSIS — Z825 Family history of asthma and other chronic lower respiratory diseases: Secondary | ICD-10-CM

## 2021-12-18 DIAGNOSIS — M5416 Radiculopathy, lumbar region: Secondary | ICD-10-CM

## 2021-12-18 DIAGNOSIS — Z87891 Personal history of nicotine dependence: Secondary | ICD-10-CM

## 2021-12-18 DIAGNOSIS — Z883 Allergy status to other anti-infective agents status: Secondary | ICD-10-CM

## 2021-12-18 DIAGNOSIS — M4317 Spondylolisthesis, lumbosacral region: Secondary | ICD-10-CM

## 2021-12-18 DIAGNOSIS — K760 Fatty (change of) liver, not elsewhere classified: Secondary | ICD-10-CM | POA: Diagnosis not present

## 2021-12-18 DIAGNOSIS — G8929 Other chronic pain: Secondary | ICD-10-CM | POA: Diagnosis present

## 2021-12-18 DIAGNOSIS — M48062 Spinal stenosis, lumbar region with neurogenic claudication: Secondary | ICD-10-CM | POA: Diagnosis not present

## 2021-12-18 DIAGNOSIS — E669 Obesity, unspecified: Secondary | ICD-10-CM | POA: Diagnosis present

## 2021-12-18 DIAGNOSIS — F419 Anxiety disorder, unspecified: Secondary | ICD-10-CM | POA: Diagnosis present

## 2021-12-18 DIAGNOSIS — Z9884 Bariatric surgery status: Secondary | ICD-10-CM

## 2021-12-18 DIAGNOSIS — Z79891 Long term (current) use of opiate analgesic: Secondary | ICD-10-CM

## 2021-12-18 DIAGNOSIS — Z981 Arthrodesis status: Secondary | ICD-10-CM

## 2021-12-18 DIAGNOSIS — Y92234 Operating room of hospital as the place of occurrence of the external cause: Secondary | ICD-10-CM | POA: Diagnosis not present

## 2021-12-18 DIAGNOSIS — G9741 Accidental puncture or laceration of dura during a procedure: Secondary | ICD-10-CM | POA: Diagnosis not present

## 2021-12-18 DIAGNOSIS — Z8261 Family history of arthritis: Secondary | ICD-10-CM | POA: Diagnosis not present

## 2021-12-18 DIAGNOSIS — I2510 Atherosclerotic heart disease of native coronary artery without angina pectoris: Secondary | ICD-10-CM | POA: Diagnosis present

## 2021-12-18 DIAGNOSIS — Z98891 History of uterine scar from previous surgery: Secondary | ICD-10-CM

## 2021-12-18 DIAGNOSIS — Z888 Allergy status to other drugs, medicaments and biological substances status: Secondary | ICD-10-CM

## 2021-12-18 DIAGNOSIS — Z91048 Other nonmedicinal substance allergy status: Secondary | ICD-10-CM

## 2021-12-18 DIAGNOSIS — E079 Disorder of thyroid, unspecified: Secondary | ICD-10-CM | POA: Diagnosis present

## 2021-12-18 DIAGNOSIS — Z8352 Family history of ear disorders: Secondary | ICD-10-CM

## 2021-12-18 DIAGNOSIS — Z6832 Body mass index (BMI) 32.0-32.9, adult: Secondary | ICD-10-CM | POA: Diagnosis not present

## 2021-12-18 DIAGNOSIS — N182 Chronic kidney disease, stage 2 (mild): Secondary | ICD-10-CM | POA: Diagnosis present

## 2021-12-18 DIAGNOSIS — M797 Fibromyalgia: Secondary | ICD-10-CM | POA: Diagnosis present

## 2021-12-18 DIAGNOSIS — Z9882 Breast implant status: Secondary | ICD-10-CM

## 2021-12-18 DIAGNOSIS — Z9049 Acquired absence of other specified parts of digestive tract: Secondary | ICD-10-CM

## 2021-12-18 DIAGNOSIS — Z90710 Acquired absence of both cervix and uterus: Secondary | ICD-10-CM

## 2021-12-18 DIAGNOSIS — M810 Age-related osteoporosis without current pathological fracture: Secondary | ICD-10-CM | POA: Diagnosis present

## 2021-12-18 DIAGNOSIS — Z7982 Long term (current) use of aspirin: Secondary | ICD-10-CM

## 2021-12-18 HISTORY — PX: ABDOMINAL EXPOSURE: SHX5708

## 2021-12-18 HISTORY — PX: ANTERIOR LUMBAR FUSION: SHX1170

## 2021-12-18 LAB — TYPE AND SCREEN
ABO/RH(D): O POS
Antibody Screen: NEGATIVE

## 2021-12-18 LAB — SURGICAL PCR SCREEN
MRSA, PCR: NEGATIVE
Staphylococcus aureus: NEGATIVE

## 2021-12-18 SURGERY — ANTERIOR LUMBAR FUSION 1 LEVEL
Anesthesia: General | Site: Back

## 2021-12-18 MED ORDER — FENTANYL CITRATE (PF) 250 MCG/5ML IJ SOLN
INTRAMUSCULAR | Status: AC
  Filled 2021-12-18: qty 5

## 2021-12-18 MED ORDER — SENNA 8.6 MG PO TABS
1.0000 | ORAL_TABLET | Freq: Two times a day (BID) | ORAL | Status: DC
  Administered 2021-12-18 – 2021-12-20 (×4): 8.6 mg via ORAL
  Filled 2021-12-18 (×4): qty 1

## 2021-12-18 MED ORDER — HYDROMORPHONE HCL 1 MG/ML IJ SOLN
INTRAMUSCULAR | Status: AC
  Filled 2021-12-18: qty 1

## 2021-12-18 MED ORDER — BUTALBITAL-APAP-CAFFEINE 50-325-40 MG PO TABS: 1.0000 | ORAL_TABLET | Freq: Two times a day (BID) | ORAL | Status: DC | PRN

## 2021-12-18 MED ORDER — LACTATED RINGERS IV SOLN: INTRAVENOUS | Status: DC

## 2021-12-18 MED ORDER — KETAMINE HCL 50 MG/5ML IJ SOSY
PREFILLED_SYRINGE | INTRAMUSCULAR | Status: AC
  Filled 2021-12-18: qty 10

## 2021-12-18 MED ORDER — SODIUM CHLORIDE 0.9 % IV SOLN
250.0000 mL | INTRAVENOUS | Status: DC
  Administered 2021-12-18: 250 mL via INTRAVENOUS

## 2021-12-18 MED ORDER — EZETIMIBE 10 MG PO TABS
10.0000 mg | ORAL_TABLET | Freq: Every day | ORAL | Status: DC
  Administered 2021-12-19 – 2021-12-20 (×2): 10 mg via ORAL
  Filled 2021-12-18 (×2): qty 1

## 2021-12-18 MED ORDER — ESTRADIOL 0.5 MG PO TABS
1.0000 mg | ORAL_TABLET | Freq: Every day | ORAL | Status: DC
  Administered 2021-12-18 – 2021-12-20 (×3): 1 mg via ORAL
  Filled 2021-12-18 (×3): qty 2

## 2021-12-18 MED ORDER — ACETAMINOPHEN 10 MG/ML IV SOLN
INTRAVENOUS | Status: AC
  Filled 2021-12-18: qty 100

## 2021-12-18 MED ORDER — VENLAFAXINE HCL ER 75 MG PO CP24
75.0000 mg | ORAL_CAPSULE | Freq: Every day | ORAL | Status: DC
  Administered 2021-12-19 – 2021-12-20 (×2): 75 mg via ORAL
  Filled 2021-12-18 (×2): qty 1

## 2021-12-18 MED ORDER — SODIUM CHLORIDE 0.9% FLUSH
3.0000 mL | Freq: Two times a day (BID) | INTRAVENOUS | Status: DC
  Administered 2021-12-18: 3 mL via INTRAVENOUS

## 2021-12-18 MED ORDER — TIZANIDINE HCL 4 MG PO TABS
4.0000 mg | ORAL_TABLET | Freq: Four times a day (QID) | ORAL | Status: DC
  Administered 2021-12-18 – 2021-12-20 (×7): 4 mg via ORAL
  Filled 2021-12-18 (×7): qty 1

## 2021-12-18 MED ORDER — HYDROMORPHONE HCL 1 MG/ML IJ SOLN
0.2500 mg | INTRAMUSCULAR | Status: DC | PRN
  Administered 2021-12-18: 0.5 mg via INTRAVENOUS
  Administered 2021-12-18 (×2): 0.25 mg via INTRAVENOUS
  Administered 2021-12-18: 0.5 mg via INTRAVENOUS

## 2021-12-18 MED ORDER — AMISULPRIDE (ANTIEMETIC) 5 MG/2ML IV SOLN: 10.0000 mg | Freq: Once | INTRAVENOUS | Status: DC | PRN

## 2021-12-18 MED ORDER — ACETAMINOPHEN 500 MG PO TABS
1000.0000 mg | ORAL_TABLET | ORAL | Status: AC
  Administered 2021-12-18: 1000 mg via ORAL
  Filled 2021-12-18: qty 2

## 2021-12-18 MED ORDER — PHENYLEPHRINE HCL (PRESSORS) 10 MG/ML IV SOLN
INTRAVENOUS | Status: DC | PRN
  Administered 2021-12-18: 160 ug via INTRAVENOUS

## 2021-12-18 MED ORDER — LIDOCAINE 2% (20 MG/ML) 5 ML SYRINGE
INTRAMUSCULAR | Status: DC | PRN
  Administered 2021-12-18: 80 mg via INTRAVENOUS

## 2021-12-18 MED ORDER — GABAPENTIN 600 MG PO TABS
600.0000 mg | ORAL_TABLET | Freq: Three times a day (TID) | ORAL | Status: DC
  Administered 2021-12-18 – 2021-12-20 (×6): 600 mg via ORAL
  Filled 2021-12-18 (×6): qty 1

## 2021-12-18 MED ORDER — PROPOFOL 10 MG/ML IV BOLUS
INTRAVENOUS | Status: AC
  Filled 2021-12-18: qty 20

## 2021-12-18 MED ORDER — BUPIVACAINE HCL (PF) 0.25 % IJ SOLN
INTRAMUSCULAR | Status: AC
  Filled 2021-12-18: qty 30

## 2021-12-18 MED ORDER — ORAL CARE MOUTH RINSE: 15.0000 mL | Freq: Once | OROMUCOSAL | Status: AC

## 2021-12-18 MED ORDER — PHENYLEPHRINE HCL-NACL 20-0.9 MG/250ML-% IV SOLN
INTRAVENOUS | Status: DC | PRN
  Administered 2021-12-18: 30 ug/min via INTRAVENOUS

## 2021-12-18 MED ORDER — SUGAMMADEX SODIUM 200 MG/2ML IV SOLN
INTRAVENOUS | Status: DC | PRN
  Administered 2021-12-18: 200 mg via INTRAVENOUS

## 2021-12-18 MED ORDER — ONDANSETRON HCL 4 MG/2ML IJ SOLN: 4.0000 mg | Freq: Once | INTRAMUSCULAR | Status: DC | PRN

## 2021-12-18 MED ORDER — 0.9 % SODIUM CHLORIDE (POUR BTL) OPTIME
TOPICAL | Status: DC | PRN
  Administered 2021-12-18 (×2): 1000 mL

## 2021-12-18 MED ORDER — ROCURONIUM BROMIDE 10 MG/ML (PF) SYRINGE
PREFILLED_SYRINGE | INTRAVENOUS | Status: DC | PRN
  Administered 2021-12-18: 20 mg via INTRAVENOUS
  Administered 2021-12-18: 10 mg via INTRAVENOUS
  Administered 2021-12-18: 20 mg via INTRAVENOUS
  Administered 2021-12-18: 10 mg via INTRAVENOUS
  Administered 2021-12-18: 30 mg via INTRAVENOUS
  Administered 2021-12-18: 10 mg via INTRAVENOUS
  Administered 2021-12-18: 70 mg via INTRAVENOUS
  Administered 2021-12-18: 30 mg via INTRAVENOUS

## 2021-12-18 MED ORDER — POTASSIUM CHLORIDE IN NACL 20-0.9 MEQ/L-% IV SOLN: INTRAVENOUS | Status: DC

## 2021-12-18 MED ORDER — CHLORHEXIDINE GLUCONATE CLOTH 2 % EX PADS: 6.0000 | MEDICATED_PAD | Freq: Once | CUTANEOUS | Status: DC

## 2021-12-18 MED ORDER — FENTANYL CITRATE (PF) 250 MCG/5ML IJ SOLN
INTRAMUSCULAR | Status: DC | PRN
  Administered 2021-12-18: 50 ug via INTRAVENOUS
  Administered 2021-12-18: 25 ug via INTRAVENOUS
  Administered 2021-12-18: 100 ug via INTRAVENOUS
  Administered 2021-12-18: 25 ug via INTRAVENOUS
  Administered 2021-12-18 (×2): 50 ug via INTRAVENOUS
  Administered 2021-12-18 (×4): 25 ug via INTRAVENOUS

## 2021-12-18 MED ORDER — OXYCODONE HCL 5 MG PO TABS
5.0000 mg | ORAL_TABLET | Freq: Once | ORAL | Status: AC | PRN
  Administered 2021-12-18: 5 mg via ORAL

## 2021-12-18 MED ORDER — ONDANSETRON HCL 4 MG/2ML IJ SOLN
INTRAMUSCULAR | Status: AC
  Filled 2021-12-18: qty 2

## 2021-12-18 MED ORDER — PHENYLEPHRINE HCL-NACL 20-0.9 MG/250ML-% IV SOLN
INTRAVENOUS | Status: AC
  Filled 2021-12-18: qty 250

## 2021-12-18 MED ORDER — SODIUM CHLORIDE 0.9% FLUSH: 3.0000 mL | INTRAVENOUS | Status: DC | PRN

## 2021-12-18 MED ORDER — DEXAMETHASONE 4 MG PO TABS
4.0000 mg | ORAL_TABLET | Freq: Four times a day (QID) | ORAL | Status: DC
  Administered 2021-12-18 – 2021-12-20 (×6): 4 mg via ORAL
  Filled 2021-12-18 (×6): qty 1

## 2021-12-18 MED ORDER — CEFAZOLIN SODIUM-DEXTROSE 2-4 GM/100ML-% IV SOLN
2.0000 g | Freq: Three times a day (TID) | INTRAVENOUS | Status: AC
  Administered 2021-12-18 – 2021-12-19 (×2): 2 g via INTRAVENOUS
  Filled 2021-12-18 (×2): qty 100

## 2021-12-18 MED ORDER — HEMOSTATIC AGENTS (NO CHARGE) OPTIME
TOPICAL | Status: DC | PRN
  Administered 2021-12-18: 1 via TOPICAL

## 2021-12-18 MED ORDER — PHENOL 1.4 % MT LIQD: 1.0000 | OROMUCOSAL | Status: DC | PRN

## 2021-12-18 MED ORDER — PANTOPRAZOLE SODIUM 40 MG PO TBEC
40.0000 mg | DELAYED_RELEASE_TABLET | Freq: Every day | ORAL | Status: DC
  Administered 2021-12-18 – 2021-12-20 (×3): 40 mg via ORAL
  Filled 2021-12-18 (×3): qty 1

## 2021-12-18 MED ORDER — HYDROMORPHONE HCL 1 MG/ML IJ SOLN
INTRAMUSCULAR | Status: AC
  Filled 2021-12-18: qty 0.5

## 2021-12-18 MED ORDER — PROPOFOL 10 MG/ML IV BOLUS
INTRAVENOUS | Status: DC | PRN
  Administered 2021-12-18: 100 mg via INTRAVENOUS

## 2021-12-18 MED ORDER — HYDROMORPHONE HCL 1 MG/ML IJ SOLN: 0.5000 mg | INTRAMUSCULAR | Status: DC | PRN

## 2021-12-18 MED ORDER — HYDROMORPHONE HCL 1 MG/ML IJ SOLN
INTRAMUSCULAR | Status: DC | PRN
  Administered 2021-12-18: .5 mg via INTRAVENOUS
  Administered 2021-12-18 (×2): .25 mg via INTRAVENOUS
  Administered 2021-12-18: .5 mg via INTRAVENOUS

## 2021-12-18 MED ORDER — ROCURONIUM BROMIDE 10 MG/ML (PF) SYRINGE
PREFILLED_SYRINGE | INTRAVENOUS | Status: AC
  Filled 2021-12-18: qty 10

## 2021-12-18 MED ORDER — ACETAMINOPHEN 325 MG PO TABS: 650.0000 mg | ORAL_TABLET | ORAL | Status: DC | PRN

## 2021-12-18 MED ORDER — DEXAMETHASONE SODIUM PHOSPHATE 4 MG/ML IJ SOLN
4.0000 mg | Freq: Four times a day (QID) | INTRAMUSCULAR | Status: DC
  Administered 2021-12-18: 4 mg via INTRAVENOUS
  Filled 2021-12-18: qty 1

## 2021-12-18 MED ORDER — ONDANSETRON HCL 4 MG PO TABS: 4.0000 mg | ORAL_TABLET | Freq: Four times a day (QID) | ORAL | Status: DC | PRN

## 2021-12-18 MED ORDER — ACETAMINOPHEN 10 MG/ML IV SOLN: 1000.0000 mg | Freq: Once | INTRAVENOUS | Status: DC | PRN

## 2021-12-18 MED ORDER — ONDANSETRON HCL 4 MG/2ML IJ SOLN: 4.0000 mg | Freq: Four times a day (QID) | INTRAMUSCULAR | Status: DC | PRN

## 2021-12-18 MED ORDER — MIDAZOLAM HCL 2 MG/2ML IJ SOLN
INTRAMUSCULAR | Status: AC
  Filled 2021-12-18: qty 2

## 2021-12-18 MED ORDER — THROMBIN 5000 UNITS EX SOLR
OROMUCOSAL | Status: DC | PRN
  Administered 2021-12-18: 5 mL via TOPICAL

## 2021-12-18 MED ORDER — GABAPENTIN 300 MG PO CAPS
300.0000 mg | ORAL_CAPSULE | ORAL | Status: DC
  Filled 2021-12-18: qty 1

## 2021-12-18 MED ORDER — FUROSEMIDE 20 MG PO TABS: 20.0000 mg | ORAL_TABLET | Freq: Every day | ORAL | Status: DC | PRN

## 2021-12-18 MED ORDER — ONDANSETRON HCL 4 MG/2ML IJ SOLN
INTRAMUSCULAR | Status: DC | PRN
  Administered 2021-12-18: 4 mg via INTRAVENOUS

## 2021-12-18 MED ORDER — BUPIVACAINE HCL (PF) 0.25 % IJ SOLN
INTRAMUSCULAR | Status: DC | PRN
  Administered 2021-12-18: 7 mL

## 2021-12-18 MED ORDER — CEFAZOLIN SODIUM-DEXTROSE 2-4 GM/100ML-% IV SOLN
2.0000 g | INTRAVENOUS | Status: AC
  Administered 2021-12-18 (×2): 2 g via INTRAVENOUS
  Filled 2021-12-18: qty 100

## 2021-12-18 MED ORDER — CELECOXIB 200 MG PO CAPS
200.0000 mg | ORAL_CAPSULE | Freq: Two times a day (BID) | ORAL | Status: DC
  Administered 2021-12-18 – 2021-12-20 (×4): 200 mg via ORAL
  Filled 2021-12-18 (×4): qty 1

## 2021-12-18 MED ORDER — DEXMEDETOMIDINE HCL IN NACL 80 MCG/20ML IV SOLN
INTRAVENOUS | Status: DC | PRN
  Administered 2021-12-18 (×2): 6 ug via BUCCAL
  Administered 2021-12-18: 8 ug via BUCCAL

## 2021-12-18 MED ORDER — MENTHOL 3 MG MT LOZG: 1.0000 | LOZENGE | OROMUCOSAL | Status: DC | PRN

## 2021-12-18 MED ORDER — THROMBIN 20000 UNITS EX SOLR
CUTANEOUS | Status: DC | PRN
  Administered 2021-12-18: 20 mL via TOPICAL

## 2021-12-18 MED ORDER — BISACODYL 5 MG PO TBEC
5.0000 mg | DELAYED_RELEASE_TABLET | Freq: Every day | ORAL | Status: DC | PRN
  Administered 2021-12-19: 5 mg via ORAL
  Filled 2021-12-18 (×2): qty 1

## 2021-12-18 MED ORDER — PROPRANOLOL HCL 10 MG PO TABS: 10.0000 mg | ORAL_TABLET | Freq: Three times a day (TID) | ORAL | Status: DC | PRN

## 2021-12-18 MED ORDER — DEXAMETHASONE SODIUM PHOSPHATE 10 MG/ML IJ SOLN
INTRAMUSCULAR | Status: DC | PRN
  Administered 2021-12-18: 5 mg via INTRAVENOUS

## 2021-12-18 MED ORDER — THROMBIN 20000 UNITS EX SOLR
CUTANEOUS | Status: AC
  Filled 2021-12-18: qty 20000

## 2021-12-18 MED ORDER — ACETAMINOPHEN 650 MG RE SUPP: 650.0000 mg | RECTAL | Status: DC | PRN

## 2021-12-18 MED ORDER — THROMBIN 5000 UNITS EX SOLR
CUTANEOUS | Status: AC
  Filled 2021-12-18: qty 5000

## 2021-12-18 MED ORDER — CHLORHEXIDINE GLUCONATE 0.12 % MT SOLN
15.0000 mL | Freq: Once | OROMUCOSAL | Status: AC
  Administered 2021-12-18: 15 mL via OROMUCOSAL
  Filled 2021-12-18: qty 15

## 2021-12-18 MED ORDER — OXYCODONE HCL 5 MG PO TABS
ORAL_TABLET | ORAL | Status: AC
  Filled 2021-12-18: qty 1

## 2021-12-18 MED ORDER — KETAMINE HCL 10 MG/ML IJ SOLN
INTRAMUSCULAR | Status: DC | PRN
  Administered 2021-12-18 (×2): 10 mg via INTRAVENOUS
  Administered 2021-12-18: 30 mg via INTRAVENOUS
  Administered 2021-12-18 (×2): 10 mg via INTRAVENOUS

## 2021-12-18 MED ORDER — OXYCODONE HCL 5 MG/5ML PO SOLN: 5.0000 mg | Freq: Once | ORAL | Status: AC | PRN

## 2021-12-18 MED ORDER — OXYCODONE-ACETAMINOPHEN 5-325 MG PO TABS
2.0000 | ORAL_TABLET | ORAL | Status: DC | PRN
  Administered 2021-12-18 – 2021-12-20 (×9): 2 via ORAL
  Filled 2021-12-18 (×10): qty 2

## 2021-12-18 MED ORDER — MIDAZOLAM HCL 5 MG/5ML IJ SOLN
INTRAMUSCULAR | Status: DC | PRN
  Administered 2021-12-18: 2 mg via INTRAVENOUS

## 2021-12-18 MED ORDER — TRAZODONE HCL 50 MG PO TABS: 50.0000 mg | ORAL_TABLET | Freq: Every evening | ORAL | Status: DC | PRN

## 2021-12-18 MED ORDER — ASPIRIN 81 MG PO TBEC
81.0000 mg | DELAYED_RELEASE_TABLET | Freq: Every day | ORAL | Status: DC
  Administered 2021-12-18 – 2021-12-20 (×3): 81 mg via ORAL
  Filled 2021-12-18 (×3): qty 1

## 2021-12-18 SURGICAL SUPPLY — 102 items
ANCHOR LUMBAR 25 MIS (Anchor) IMPLANT
APPLIER CLIP 11 MED OPEN (CLIP) ×2
BAG COUNTER SPONGE SURGICOUNT (BAG) ×6 IMPLANT
BASKET BONE COLLECTION (BASKET) ×2 IMPLANT
BENZOIN TINCTURE PRP APPL 2/3 (GAUZE/BANDAGES/DRESSINGS) ×2 IMPLANT
BLADE BONE MILL MEDIUM (MISCELLANEOUS) ×2 IMPLANT
BLADE CLIPPER SURG (BLADE) IMPLANT
BOWL CEMENT MIX W SPATULA BONE (MISCELLANEOUS) IMPLANT
BUR BARREL STRAIGHT FLUTE 4.0 (BURR) IMPLANT
BUR CARBIDE MATCH 3.0 (BURR) ×2 IMPLANT
CABLE BIPOLOR RESECTION CORD (MISCELLANEOUS) IMPLANT
CANISTER SUCT 3000ML PPV (MISCELLANEOUS) ×4 IMPLANT
CLIP APPLIE 11 MED OPEN (CLIP) ×2 IMPLANT
CLIP LIGATING EXTRA MED SLVR (CLIP) IMPLANT
CNTNR URN SCR LID CUP LEK RST (MISCELLANEOUS) ×2 IMPLANT
CONT SPEC 4OZ STRL OR WHT (MISCELLANEOUS) ×2
COVER BACK TABLE 60X90IN (DRAPES) ×2 IMPLANT
DERMABOND ADVANCED .7 DNX12 (GAUZE/BANDAGES/DRESSINGS) ×2 IMPLANT
DRAPE C-ARM 42X72 X-RAY (DRAPES) ×10 IMPLANT
DRAPE HALF SHEET 40X57 (DRAPES) IMPLANT
DRAPE INCISE IOBAN 66X45 STRL (DRAPES) IMPLANT
DRAPE LAPAROTOMY 100X72X124 (DRAPES) ×4 IMPLANT
DRAPE SURG 17X23 STRL (DRAPES) ×2 IMPLANT
DRSG OPSITE POSTOP 4X8 (GAUZE/BANDAGES/DRESSINGS) IMPLANT
DURAPREP 26ML APPLICATOR (WOUND CARE) ×4 IMPLANT
ELECT BLADE 4.0 EZ CLEAN MEGAD (MISCELLANEOUS) ×4
ELECT REM PT RETURN 9FT ADLT (ELECTROSURGICAL) ×4
ELECTRODE BLDE 4.0 EZ CLN MEGD (MISCELLANEOUS) ×4 IMPLANT
ELECTRODE REM PT RTRN 9FT ADLT (ELECTROSURGICAL) ×4 IMPLANT
EVACUATOR 1/8 PVC DRAIN (DRAIN) ×2 IMPLANT
GAUZE 4X4 16PLY ~~LOC~~+RFID DBL (SPONGE) IMPLANT
GAUZE SPONGE 4X4 12PLY STRL (GAUZE/BANDAGES/DRESSINGS) ×2 IMPLANT
GLOVE BIO SURGEON STRL SZ7 (GLOVE) IMPLANT
GLOVE BIO SURGEON STRL SZ7.5 (GLOVE) ×2 IMPLANT
GLOVE BIO SURGEON STRL SZ8 (GLOVE) ×8 IMPLANT
GLOVE BIOGEL PI IND STRL 7.0 (GLOVE) IMPLANT
GLOVE BIOGEL PI IND STRL 8 (GLOVE) ×2 IMPLANT
GOWN STRL REUS W/ TWL LRG LVL3 (GOWN DISPOSABLE) IMPLANT
GOWN STRL REUS W/ TWL XL LVL3 (GOWN DISPOSABLE) ×6 IMPLANT
GOWN STRL REUS W/TWL 2XL LVL3 (GOWN DISPOSABLE) ×2 IMPLANT
GOWN STRL REUS W/TWL LRG LVL3 (GOWN DISPOSABLE)
GOWN STRL REUS W/TWL XL LVL3 (GOWN DISPOSABLE) ×6
GRAFT BONE PROTEIOS XL 10CC (Orthopedic Implant) IMPLANT
GRAFT TRINITY ELITE LGE HUMAN (Tissue) IMPLANT
GUIDE TRIPLE BARREL ANCH 13 (ORTHOPEDIC DISPOSABLE SUPPLIES) IMPLANT
HEMOSTAT POWDER KIT SURGIFOAM (HEMOSTASIS) ×2 IMPLANT
INSERT FOGARTY 61MM (MISCELLANEOUS) IMPLANT
INSERT FOGARTY SM (MISCELLANEOUS) IMPLANT
KIT BASIN OR (CUSTOM PROCEDURE TRAY) ×4 IMPLANT
KIT GRAFTMAG DEL NEURO DISP (NEUROSURGERY SUPPLIES) IMPLANT
KIT INFUSE XX SMALL 0.7CC (Orthopedic Implant) IMPLANT
KIT TURNOVER KIT B (KITS) ×4 IMPLANT
MATRIX STRIP NEOCORE 12C (Putty) IMPLANT
MILL BONE PREP (MISCELLANEOUS) ×2 IMPLANT
NDL HYPO 25X1 1.5 SAFETY (NEEDLE) ×2 IMPLANT
NDL SPNL 18GX3.5 QUINCKE PK (NEEDLE) ×2 IMPLANT
NEEDLE HYPO 25X1 1.5 SAFETY (NEEDLE) ×2 IMPLANT
NEEDLE SPNL 18GX3.5 QUINCKE PK (NEEDLE) ×2 IMPLANT
NS IRRIG 1000ML POUR BTL (IV SOLUTION) ×4 IMPLANT
PACK LAMINECTOMY NEURO (CUSTOM PROCEDURE TRAY) ×4 IMPLANT
PAD ARMBOARD 7.5X6 YLW CONV (MISCELLANEOUS) ×10 IMPLANT
PENCIL BUTTON HOLSTER BLD 10FT (ELECTRODE) IMPLANT
ROD LORD LIPPED TI 5.5X40 (Rod) IMPLANT
ROD SPINAL THRD DISP (MISCELLANEOUS) IMPLANT
SCREW KODIAK 6.5X40 (Screw) IMPLANT
SCREW KODIAK 6.5X45 (Screw) IMPLANT
SEALANT ADHERUS EXTEND TIP (MISCELLANEOUS) IMPLANT
SET SCREW (Screw) ×16 IMPLANT
SET SCREW SPNE (Screw) IMPLANT
SPACER HEDRON 26X34X13 15D (Spacer) IMPLANT
SPACER IDENTITI PS 8X9X25 15D (Spacer) IMPLANT
SPONGE INTESTINAL PEANUT (DISPOSABLE) ×4 IMPLANT
SPONGE SURGIFOAM ABS GEL 100 (HEMOSTASIS) ×2 IMPLANT
SPONGE T-LAP 18X18 ~~LOC~~+RFID (SPONGE) ×4 IMPLANT
SPONGE T-LAP 4X18 ~~LOC~~+RFID (SPONGE) IMPLANT
STRIP CLOSURE SKIN 1/2X4 (GAUZE/BANDAGES/DRESSINGS) ×4 IMPLANT
STRIP MATRIX NEOCORE 12CC (Putty) ×2 IMPLANT
SUT PDS AB 1 CTX 36 (SUTURE) IMPLANT
SUT PROLENE 4 0 RB 1 (SUTURE)
SUT PROLENE 4-0 RB1 .5 CRCL 36 (SUTURE) IMPLANT
SUT PROLENE 5 0 CC1 (SUTURE) IMPLANT
SUT PROLENE 6 0 BV (SUTURE) IMPLANT
SUT PROLENE 6 0 C 1 30 (SUTURE) IMPLANT
SUT PROLENE 6 0 CC (SUTURE) IMPLANT
SUT SILK 0 TIES 10X30 (SUTURE) IMPLANT
SUT SILK 2 0 TIES 10X30 (SUTURE) ×2 IMPLANT
SUT SILK 2 0SH CR/8 30 (SUTURE) IMPLANT
SUT SILK 3 0 TIES 17X18 (SUTURE)
SUT SILK 3 0SH CR/8 30 (SUTURE) IMPLANT
SUT SILK 3-0 18XBRD TIE BLK (SUTURE) IMPLANT
SUT VIC AB 0 CT1 18XCR BRD8 (SUTURE) ×4 IMPLANT
SUT VIC AB 0 CT1 27 (SUTURE) ×2
SUT VIC AB 0 CT1 27XBRD ANBCTR (SUTURE) ×4 IMPLANT
SUT VIC AB 0 CT1 8-18 (SUTURE) ×6
SUT VIC AB 2-0 CP2 18 (SUTURE) ×4 IMPLANT
SUT VIC AB 3-0 SH 8-18 (SUTURE) ×6 IMPLANT
SUT VICRYL 4-0 PS2 18IN ABS (SUTURE) ×2 IMPLANT
SYR CONTROL 10ML LL (SYRINGE) IMPLANT
TOWEL GREEN STERILE (TOWEL DISPOSABLE) ×6 IMPLANT
TOWEL GREEN STERILE FF (TOWEL DISPOSABLE) ×8 IMPLANT
TRAY FOLEY MTR SLVR 16FR STAT (SET/KITS/TRAYS/PACK) ×4 IMPLANT
WATER STERILE IRR 1000ML POUR (IV SOLUTION) ×4 IMPLANT

## 2021-12-18 NOTE — Anesthesia Procedure Notes (Signed)
Arterial Line Insertion Start/End12/11/2021 8:52 AM, 12/18/2021 8:52 AM Performed by: Lavell Luster, CRNA  Preanesthetic checklist: patient identified, IV checked, site marked, risks and benefits discussed, surgical consent, monitors and equipment checked, pre-op evaluation and timeout performed radial was placed Catheter size: 20 G Hand hygiene performed , maximum sterile barriers used  and Seldinger technique used Allen's test indicative of satisfactory collateral circulation Attempts: 3 Procedure performed without using ultrasound guided technique. Ultrasound Notes:no ultrasound evidence of intravascular and/or intraneural injection Following insertion, Biopatch and dressing applied. Post procedure assessment: normal  Patient tolerated the procedure well with no immediate complications.

## 2021-12-18 NOTE — Anesthesia Postprocedure Evaluation (Signed)
Anesthesia Post Note  Patient: Ashley Fields  Procedure(s) Performed: Anterior Lumbar Interbody Fusion  - Lumbar Five-Sacral One Posterior Lumbar Interbody Fusion  - Lumbar Two-Lumbar Three (Back) ABDOMINAL EXPOSURE     Patient location during evaluation: PACU Anesthesia Type: General Level of consciousness: awake and alert Pain management: pain level controlled Vital Signs Assessment: post-procedure vital signs reviewed and stable Respiratory status: spontaneous breathing, nonlabored ventilation, respiratory function stable and patient connected to nasal cannula oxygen Cardiovascular status: blood pressure returned to baseline and stable Postop Assessment: no apparent nausea or vomiting Anesthetic complications: no  No notable events documented.  Last Vitals:  Vitals:   12/18/21 1645 12/18/21 1720  BP: (!) 150/75 (!) 152/70  Pulse: 85 97  Resp: 14 18  Temp: 36.7 C 36.9 C  SpO2: 97% 94%    Last Pain:  Vitals:   12/18/21 1720  TempSrc: Oral  PainSc:                  [place]

## 2021-12-18 NOTE — H&P (Signed)
Subjective: Patient is a 66 y.o. female admitted for back and leg pain. Onset of symptoms was several months ago, gradually worsening since that time.  The pain is rated severe, and is located at the across the lower back and radiates to legs. The pain is described as aching and occurs all day. The symptoms have been progressive. Symptoms are exacerbated by exercise, standing, and walking for more than a few minutes. MRI or CT showed DDD L5-S1, adjacent level stenosis L2-3   Past Medical History:  Diagnosis Date   Allergy    Anemia    Anxiety    Arthritis    Yes   Back pain    Blood transfusion without reported diagnosis 1977   Transfusions from miscarriage & hemorrhaging   Chronic kidney disease    Depression    Disp fx of cuboid bone of right foot, init for clos fx 01/01/2017   Fibromyalgia    GERD (gastroesophageal reflux disease)    Headache    Hepatitis C    Hyperlipidemia    Lumbar radiculopathy    Neck pain    Neuromuscular disorder ([place]) 1997   Falling to much was an eye-opener   Osteoporosis    Thyroid disease    Urine incontinence     Past Surgical History:  Procedure Laterality Date   5 miscarriages     1977-1985, Blood transfusion s/p miscarriage [place]   Total   APPENDECTOMY  12/2008   AUGMENTATION MAMMAPLASTY Bilateral 2015   Bilat   AUGMENTATION MAMMAPLASTY Bilateral 2018   implants redone w/ placement of implants under muscle   breast lift bilateral, implants  15/40/0867   bilateral, silicon naturel   BREAST REDUCTION SURGERY Bilateral 1997   CESAREAN SECTION  07/27/1981   Placenta Previa   CHOLECYSTECTOMY  03/2004   Lap surgery   COLONOSCOPY WITH PROPOFOL N/A 06/13/2020   Procedure: COLONOSCOPY WITH PROPOFOL;  Surgeon: Virgel Manifold, MD;  Location: [facility] ENDOSCOPY;  Service: Endoscopy;  Laterality: N/A;   COSMETIC SURGERY     Breast implants w/ lift, tummy tuck, upper & lower Blepharop   ESOPHAGOGASTRODUODENOSCOPY  (EGD) WITH PROPOFOL N/A 06/13/2020   Procedure: ESOPHAGOGASTRODUODENOSCOPY (EGD) WITH PROPOFOL;  Surgeon: Virgel Manifold, MD;  Location: [facility] ENDOSCOPY;  Service: Endoscopy;  Laterality: N/A;   EYE SURGERY  2017   Cataract surgery & lasik   GASTRIC BYPASS  2005   Laparoscopic   IVC FILTER INSERTION  12/2003   TrapEase Vena Cava Filter   LUMBAR LAMINECTOMY  06/2006   L4-L5 (spinal fusion)   mini tummy tuck  05/07/2016   Bilateral bra/back roll lift skin removal   neck fusion     c3-c7 cadavier bones and metal plates placed per patient   neck surgery C3-C7  02/10/2015   ACDF   REDUCTION MAMMAPLASTY Bilateral 1997   SHOULDER ACROMIOPLASTY Left 03/27/2013   w/ labral debridement   SMALL INTESTINE SURGERY  12/24/2003   Lap Gastric Bypass   SPINAL CORD STIMULATOR IMPLANT  11/2010   removed 05/2011   SPINE SURGERY  2008 2017   Fusion L4-L5, ACDF C4-C7    Prior to Admission medications   Medication Sig Start Date End Date Taking? Authorizing Provider  ASPIRIN LOW DOSE 81 MG tablet TAKE 1 TABLET BY MOUTH EVERY DAY Patient taking differently: Take 81 mg by mouth daily. 08/21/21  Yes Karamalegos, Devonne Doughty, DO  bisacodyl (DULCOLAX) 5 MG EC tablet Take 5 mg by mouth daily  as needed for moderate constipation.   Yes [provider]  Cholecalciferol 250 MCG (10000 UT) CAPS Take 10,000 Units by mouth once a week.   Yes [provider]  cyanocobalamin (VITAMIN B12) 1000 MCG/ML injection Inject 1 mL (1,000 mcg total) into the muscle every 30 (thirty) days. 10/09/21  Yes Karamalegos, Devonne Doughty, DO  estradiol (ESTRACE) 1 MG tablet TAKE 1 TABLET EVERY DAY Patient taking differently: Take 1 mg by mouth daily. 06/22/21  Yes Karamalegos, Devonne Doughty, DO  etodolac (LODINE) 500 MG tablet Take 1 tablet by mouth 2 (two) times daily. 08/28/21  Yes [provider]  ezetimibe (ZETIA) 10 MG tablet TAKE 1 TABLET BY MOUTH ONCE DAILY 11/08/21  Yes Karamalegos, Devonne Doughty, DO   gabapentin (NEURONTIN) 600 MG tablet Take 1 tablet (600 mg total) by mouth in the morning, at noon, in the evening, and at bedtime. 11/09/21  Yes Karamalegos, Devonne Doughty, DO  Lifitegrast (XIIDRA) 5 % SOLN Apply 1 drop to eye 2 (two) times daily as needed. Each eye   Yes [provider]  omeprazole (PRILOSEC) 40 MG capsule TAKE 1 CAPSULE BY MOUTH EVERY DAY Patient taking differently: Take 40 mg by mouth daily. 07/27/21  Yes Karamalegos, Alexander J, DO  ondansetron (ZOFRAN ODT) 4 MG disintegrating tablet Take 1 tablet (4 mg total) by mouth every 8 (eight) hours as needed for nausea or vomiting. 12/16/18  Yes Karamalegos, Devonne Doughty, DO  oxyCODONE-acetaminophen (PERCOCET) 10-325 MG tablet Take 1 tablet by mouth 4 (four) times daily.   Yes [provider]  rosuvastatin (CRESTOR) 40 MG tablet TAKE 1 TABLET BY MOUTH EVERY DAY 11/22/21  Yes Gollan, Kathlene November, MD  tiZANidine (ZANAFLEX) 4 MG tablet Take 1 tablet (4 mg total) by mouth 4 (four) times daily. 09/18/21  Yes Karamalegos, Devonne Doughty, DO  traZODone (DESYREL) 100 MG tablet TAKE 1 TABLET BY MOUTH EVERYDAY AT BEDTIME 11/22/21  Yes Eappen, Saramma, MD  venlafaxine XR (EFFEXOR-XR) 75 MG 24 hr capsule TAKE ONE CAPSULE BY MOUTH DAILY WITH BREAKFAST Patient taking differently: Take 75 mg by mouth daily with breakfast. 06/22/21  Yes Eappen, Ria Clock, MD  amoxicillin-clavulanate (AUGMENTIN) 875-125 MG tablet Take 1 tablet by mouth 2 (two) times daily. Patient not taking: Reported on 11/15/2021 11/09/21   Olin Hauser, DO  butalbital-acetaminophen-caffeine (FIORICET) [phone] MG tablet Take 1 tablet by mouth 2 (two) times daily as needed for headache.    [provider]  diclofenac Sodium (VOLTAREN) 1 % GEL Apply 2 g topically 4 (four) times daily as needed (pain). 10/07/19   [provider]  fluticasone (FLONASE) 50 MCG/ACT nasal spray SPRAY 2 SPRAYS INTO EACH NOSTRIL EVERY DAY Patient taking differently: Place 2  sprays into both nostrils daily as needed. 10/23/21   Karamalegos, Devonne Doughty, DO  furosemide (LASIX) 20 MG tablet Take 1 tablet (20 mg total) by mouth daily as needed for fluid or edema. 06/22/21   Minna Merritts, MD  hydrOXYzine (ATARAX/VISTARIL) 25 MG tablet TAKE 1/2 TO 1 TABLET BY MOUTH TWICE A DAY AS NEEDED FOR SEVERE PANIC ATTACKS/ ANXIETY Patient taking differently: Take 12.5-25 mg by mouth 2 (two) times daily as needed for anxiety. 07/21/20   Ursula Alert, MD  propranolol (INDERAL) 10 MG tablet Take 1 tablet (10 mg total) by mouth 3 (three) times daily as needed. For anxiety attacks 10/24/21   Ursula Alert, MD  rizatriptan (MAXALT-MLT) 10 MG disintegrating tablet Take 1 tablet (10 mg total) by mouth as needed for migraine.  May repeat in 2 hours if needed, that is max dose for 24 hours. 03/14/21   Karamalegos, Devonne Doughty, DO  trimethoprim-polymyxin b (POLYTRIM) ophthalmic solution Place 1 drop into the right eye every 6 (six) hours. 11/09/21   Karamalegos, Devonne Doughty, DO  valACYclovir (VALTREX) 1000 MG tablet TAKE 1 TABLET (1,000 MG TOTAL) BY MOUTH DAILY. FOR 5-7 DAYS, MAY REPEAT IF NEED FOR COLD SORE 12/04/21   Olin Hauser, DO  Zoledronic Acid (ZOMETA) 4 MG/100ML IVPB Once per year from Orthopedic 05/05/21   Olin Hauser, DO   Allergies  Allergen Reactions   Flagyl [Metronidazole] Anaphylaxis   Cymbalta [Duloxetine Hcl] Anxiety   Tape Rash    Paper tape okay to use per patient.    Social History   Tobacco Use   Smoking status: Former    Packs/day: 0.25    Years: 10.00    Total pack years: 2.50    Types: Cigarettes    Quit date: 11/29/1995    Years since quitting: 26.0   Smokeless tobacco: Former   Tobacco comments:    Quite 1997, didnt smoke hardly at all when I was smoking.  Substance Use Topics   Alcohol use: No    Family History  Problem Relation Age of Onset   COPD Mother    Lung cancer Mother    Diabetes Mother    Heart disease Mother     Stroke Mother    Heart attack Mother    Arthritis Mother    Cancer Mother    Hypertension Mother    Obesity Mother    Varicose Veins Mother    Heart disease Father    Heart attack Father 50   Early death Father    Depression Sister        x 5 sisters   COPD Sister    Hypertension Sister    Heart disease Brother        x 3 brothers   Diabetes Brother    Colon polyps Sister    Hearing loss Sister    Heart attack Sister    Heart attack Brother    Arthritis Sister    Varicose Veins Sister    Arthritis Brother    Heart disease Brother    Hypertension Brother    Cancer Maternal Grandfather    Stroke Maternal Grandfather    COPD Sister    Obesity Sister    Depression Sister    Depression Sister    Heart disease Sister    Hypertension Sister    Diabetes Brother    Heart disease Brother    Hypertension Brother    Obesity Brother    Varicose Veins Brother    Heart disease Brother    Hypertension Brother    Obesity Brother    Hypertension Sister    Obesity Sister    Miscarriages / Stillbirths Maternal Aunt    Miscarriages / Stillbirths Paternal Aunt    Breast cancer Neg Hx      Review of Systems  Positive ROS: neg  All other systems have been reviewed and were otherwise negative with the exception of those mentioned in the HPI and as above.  Objective: Vital signs in last 24 hours: Temp:  [98.6 F (37 C)] 98.6 F (37 C) (12/11 0706) Pulse Rate:  [66] 66 (12/11 0706) Resp:  [18] 18 (12/11 0706) BP: (120)/(71) 120/71 (12/11 0706) Weight:  [80.7 kg] 80.7 kg (12/11 0706)  General Appearance: Alert, cooperative, no distress, appears stated  age Head: Normocephalic, without obvious abnormality, atraumatic Eyes: PERRL, conjunctiva/corneas clear, EOM's intact    Neck: Supple, symmetrical, trachea midline Back: Symmetric, no curvature, ROM normal, no CVA tenderness Lungs:  respirations unlabored Heart: Regular rate and rhythm Abdomen: Soft,  non-tender Extremities: Extremities normal, atraumatic, no cyanosis or edema Pulses: 2+ and symmetric all extremities Skin: Skin color, texture, turgor normal, no rashes or lesions  NEUROLOGIC:   Mental status: Alert and oriented x4,  no aphasia, good attention span, fund of knowledge, and memory Motor Exam - grossly normal Sensory Exam - grossly normal Reflexes: 1= Coordination - grossly normal Gait - grossly normal Balance - grossly normal Cranial Nerves: I: smell Not tested  II: visual acuity  OS: nl    OD: nl  II: visual fields Full to confrontation  II: pupils Equal, round, reactive to light  III,VII: ptosis None  III,IV,VI: extraocular muscles  Full ROM  V: mastication Normal  V: facial light touch sensation  Normal  V,VII: corneal reflex  Present  VII: facial muscle function - upper  Normal  VII: facial muscle function - lower Normal  VIII: hearing Not tested  IX: soft palate elevation  Normal  IX,X: gag reflex Present  XI: trapezius strength  5/5  XI: sternocleidomastoid strength 5/5  XI: neck flexion strength  5/5  XII: tongue strength  Normal    Data Review Lab Results  Component Value Date   WBC 5.1 11/15/2021   HGB 14.5 11/15/2021   HCT 45.0 11/15/2021   MCV 94.5 11/15/2021   PLT 171 11/15/2021   Lab Results  Component Value Date   NA 143 11/15/2021   K 4.2 11/15/2021   CL 110 11/15/2021   CO2 30 11/15/2021   BUN 16 11/15/2021   CREATININE 0.69 11/15/2021   GLUCOSE 94 11/15/2021   Lab Results  Component Value Date   INR 1.0 11/15/2021    Assessment/Plan:  Estimated body mass index is 32.56 kg/m as calculated from the following:   Height as of this encounter: '5\' 2"'$  (1.575 m).   Weight as of this encounter: 80.7 kg. Patient admitted for ALIF L5-S1, PLIF L2-3. Patient has failed a reasonable attempt at conservative therapy.  I explained the condition and procedure to the patient and answered any questions.  Patient wishes to proceed with  procedure as planned. Understands risks/ benefits and typical outcomes of procedure.   Eustace Moore 12/18/2021 7:23 AM

## 2021-12-18 NOTE — Anesthesia Procedure Notes (Signed)
Procedure Name: Intubation Date/Time: 12/18/2021 8:00 AM  Performed by: Elvin So, CRNAPre-anesthesia Checklist: Patient identified, Emergency Drugs available, Suction available and Patient being monitored Patient Re-evaluated:Patient Re-evaluated prior to induction Oxygen Delivery Method: Circle System Utilized Preoxygenation: Pre-oxygenation with 100% oxygen Induction Type: IV induction Ventilation: Mask ventilation without difficulty Laryngoscope Size: Mac and 3 Grade View: Grade I Tube type: Oral Tube size: 7.0 mm Number of attempts: 1 Airway Equipment and Method: Stylet and Oral airway Placement Confirmation: ETT inserted through vocal cords under direct vision, positive ETCO2 and breath sounds checked- equal and bilateral Secured at: 21 cm Tube secured with: Tape Dental Injury: Teeth and Oropharynx as per pre-operative assessment

## 2021-12-18 NOTE — Transfer of Care (Signed)
Immediate Anesthesia Transfer of Care Note  Patient: Latroya Ng  Procedure(s) Performed: Anterior Lumbar Interbody Fusion  - Lumbar Five-Sacral One Posterior Lumbar Interbody Fusion  - Lumbar Two-Lumbar Three (Back) ABDOMINAL EXPOSURE  Patient Location: PACU  Anesthesia Type:General  Level of Consciousness: awake and patient cooperative  Airway & Oxygen Therapy: Patient Spontanous Breathing and Patient connected to face mask oxygen  Post-op Assessment: Report given to RN, Post -op Vital signs reviewed and stable, and Patient moving all extremities  Post vital signs: Reviewed and stable  Last Vitals:  Vitals Value Taken Time  BP 157/87 12/18/21 1545  Temp    Pulse 101 12/18/21 1551  Resp 14 12/18/21 1551  SpO2 99 % 12/18/21 1551  Vitals shown include unvalidated device data.  Last Pain:  Vitals:   12/18/21 0706  TempSrc: Oral  PainSc: 5          Complications: No notable events documented.

## 2021-12-18 NOTE — Op Note (Signed)
Date: December 18, 2021  Preoperative diagnosis: Chronic lower back pain  Postoperative diagnosis: Same  Procedure: Anterior spine exposure of the L5-S1 disc space via anterior retroperitoneal approach for L5-S1 ALIF  Surgeon: Dr. Marty Heck, MD  Co-surgeon: Dr. Sherley Bounds, MD  Indications: 66 year old female with chronic lower back pain.  Ultimately she has been evaluated by neurosurgery and Dr. Ronnald Ramp and vascular surgery was asked to assist with anterior spine exposure for L5-S1 ALIF.  She presents today after risk benefits discussed.  Findings: The L5-S1 disc space was marked over the left rectus muscle with a fluoroscopic C arm in the lateral position.  We made a transverse incision here and dissected down through the subcutaneous tissue and then opened the anterior rectus sheath transversely mobilized the left rectus muscle to the midline.  Entered into the retroperitoneum and mobilized peritoneum and left ureter across midline.  Middle sacral vessels were divided between clips.  We did mobilize on both sides of the disc space including the left iliac vein.  A fixed NuVasive retractor was placed.  Confirmed we were at the correct level on lateral fluoroscopy.  Anesthesia: General  Details: Patient was taken to the operating room after informed consent was obtained.  Placed on the operative table in the supine position.  General endotracheal anesthesia was induced.  Fluoroscopic C-arm was then used in the lateral position to mark the L5-S1 disc space over the left rectus muscle.  Patient was then prepped and draped in standard sterile fashion.  Antibiotics were given.  Timeout performed.  Ultimately made a transverse incision over our preoperative mark over the left rectus muscle at the L5-S1 disc space.  Dissected down with Bovie cautery and opened the subcutaneous tissue.  This was severely scarred given her previous abdominoplasty.  I then opened the anterior rectus sheath with  Bovie cautery transversely and then raised flaps underneath the anterior rectus sheath.  The left rectus muscle was mobilized to the midline.  Entered lateral to the muscle into the retroperitoneum and mobilized peritoneum and left ureter across midline.  Ultimately I mobilized the left iliac vein lateral to the disc space.  The middle sacral vessels were divided between clips.  This was very scarred in from her previous posterior spine surgery.  Once we had good working room at the L5-S1 disc space a fixed NuVasive retractor was placed on the field.  I placed a 180 reverse lip to the right and 160 reverse lip to the left of the disc space.  Placed a 140 reverse lip cranial and a 160 reverse lip retractor caudal.  We had good anterior exposure.  We confirmed with a spinal needle that we were at the correct L5-S1 level on lateral fluoroscopy.  Case was turned over to Dr. Ronnald Ramp.  Complication: None  Condition: Stable  Marty Heck, MD [facility] Office: [place]

## 2021-12-18 NOTE — H&P (Signed)
History and Physical Interval Note:  12/18/2021 7:20 AM  Ashley Fields  has presented today for surgery, with the diagnosis of Spondylolisthesis - stenosis.  The various methods of treatment have been discussed with the patient and family. After consideration of risks, benefits and other options for treatment, the patient has consented to  Procedure(s): ALIF  - L5-S1 (N/A) PLIF  - L2-L3 (N/A) ABDOMINAL EXPOSURE (N/A) as a surgical intervention.  The patient's history has been reviewed, patient examined, no change in status, stable for surgery.  I have reviewed the patient's chart and labs.  Questions were answered to the patient's satisfaction.    Abdominal exposure for L5-S1 ALIF  Marty Heck        Patient name: Ashley Fields       MRN: [id number]        DOB: Jun 20, 1955          Sex: female   REASON FOR CONSULT: Evaluate for abdominal exposure for L5-S1 ALIF   HPI: Ashley Fields is a 66 y.o. female, with history of chronic kidney disease, hyperlipidemia, chronic back pain that presents for evaluation of anterior spine exposure for L5-S1 ALIF.  Patient describes chronic lower back pain with right leg radiculopathy.  Her MRI has showed spondylolisthesis at L5-S1 with foraminal stenosis even after previous surgery at this level.  She also has degenerative spondylolisthesis and stenosis at L2-L3.  He is also playing a L2-L3 PLIF.  She has been evaluated by Dr. Ronnald Ramp who has recommended an L5-S1 ALIF and asked vascular surgery to assist.  Her previous abdominal surgery includes appendectomy, hysterectomy, cholecystectomy, C-section, gastric bypass as well as a tummy tuck.  She states no previous anterior spine surgery.       Past Medical History:  Diagnosis Date   Allergy     Anemia     Anxiety     Arthritis      Yes   Back pain     Blood transfusion without reported diagnosis 1977    Transfusions from miscarriage & hemorrhaging   Chronic kidney disease     Depression     Disp fx  of cuboid bone of right foot, init for clos fx 01/01/2017   GERD (gastroesophageal reflux disease)     Headache     Hepatitis C     Hyperlipidemia     Lumbar radiculopathy     Neck pain     Neuromuscular disorder ([place]) 1997    Falling to much was an eye-opener   Osteoporosis     Thyroid disease     Urine incontinence             Past Surgical History:  Procedure Laterality Date   5 miscarriages        1977-1985, Blood transfusion s/p miscarriage Kathryn    Total   APPENDECTOMY   12/2008   AUGMENTATION MAMMAPLASTY Bilateral 2015    Bilat   AUGMENTATION MAMMAPLASTY Bilateral 2018    implants redone w/ placement of implants under muscle   breast lift bilateral, implants   05/18/2015    bilateral, silicon naturel   BREAST REDUCTION SURGERY Bilateral 1997   CESAREAN SECTION   07/27/1981    Placenta Previa   CHOLECYSTECTOMY   03/2004    Lap surgery   COLONOSCOPY WITH PROPOFOL N/A 06/13/2020    Procedure: COLONOSCOPY WITH PROPOFOL;  Surgeon: Virgel Manifold, MD;  Location: [facility] ENDOSCOPY;  Service: Endoscopy;  Laterality: N/A;  COSMETIC SURGERY        Breast implants w/ lift, tummy tuck, upper & lower Blepharop   ESOPHAGOGASTRODUODENOSCOPY (EGD) WITH PROPOFOL N/A 06/13/2020    Procedure: ESOPHAGOGASTRODUODENOSCOPY (EGD) WITH PROPOFOL;  Surgeon: Virgel Manifold, MD;  Location: [facility] ENDOSCOPY;  Service: Endoscopy;  Laterality: N/A;   EYE SURGERY   2017    Cataract surgery & lasik   GASTRIC BYPASS   2005    Laparoscopic   IVC FILTER INSERTION   12/2003    TrapEase Vena Cava Filter   LUMBAR LAMINECTOMY   06/2006    L4-L5 (spinal fusion)   mini tummy tuck   05/07/2016    Bilateral bra/back roll lift skin removal   neck surgery C3-C7   02/10/2015    ACDF   REDUCTION MAMMAPLASTY Bilateral 1997   SHOULDER ACROMIOPLASTY Left 03/27/2013    w/ labral debridement   SMALL INTESTINE SURGERY   12/24/2003    Lap Gastric Bypass   SPINAL CORD  STIMULATOR IMPLANT   11/2010    removed 05/2011   SPINE SURGERY   2008 2017    Fusion L4-L5, ACDF C4-C7           Family History  Problem Relation Age of Onset   COPD Mother     Lung cancer Mother     Diabetes Mother     Heart disease Mother     Stroke Mother     Heart attack Mother     Arthritis Mother     Cancer Mother     Hypertension Mother     Obesity Mother     Varicose Veins Mother     Heart disease Father     Heart attack Father 17   Early death Father     Depression Sister          x 73 sisters   COPD Sister     Hypertension Sister     Heart disease Brother          x 3 brothers   Diabetes Brother     Colon polyps Sister     Hearing loss Sister     Heart attack Sister     Heart attack Brother     Arthritis Sister     Varicose Veins Sister     Arthritis Brother     Heart disease Brother     Hypertension Brother     Cancer Maternal Grandfather     Stroke Maternal Grandfather     COPD Sister     Obesity Sister     Depression Sister     Depression Sister     Heart disease Sister     Hypertension Sister     Diabetes Brother     Heart disease Brother     Hypertension Brother     Obesity Brother     Varicose Veins Brother     Heart disease Brother     Hypertension Brother     Obesity Brother     Hypertension Sister     Obesity Sister     Miscarriages / Stillbirths Maternal Aunt     Miscarriages / Stillbirths Paternal Aunt     Breast cancer Neg Hx        SOCIAL HISTORY: Social History         Socioeconomic History   Marital status: Married      Spouse name: Not on file   Number of children: 1   Years of education: High  School   Highest education level: Some college, no degree  Occupational History   Occupation: disability  Tobacco Use   Smoking status: Former      Packs/day: 0.25      Years: 10.00      Total pack years: 2.50      Types: Cigarettes      Quit date: 11/29/1995      Years since quitting: 25.9   Smokeless tobacco: Former    Tobacco comments:      Quite 1997, didnt smoke hardly at all when I was smoking.  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]    Social Determinants of Health        Financial Resource Strain: [place]  (04/06/2021)    Overall Financial Resource Strain (CARDIA)     Difficulty of Paying Living Expenses: Not very hard  Food Insecurity: No Food Insecurity (04/06/2021)    Hunger Vital Sign     Worried About Running Out of Food in the Last Year: Never true     Ran Out of Food in the Last Year: Never true  Transportation Needs: No Transportation Needs (04/06/2021)    PRAPARE - Armed forces logistics/support/administrative officer (Medical): No     Lack of Transportation (Non-Medical): No  Physical Activity: Insufficiently Active (04/06/2021)    Exercise Vital Sign     Days of Exercise per Week: 2 days     Minutes of Exercise per Session: 10 min  Stress: Stress Concern Present (04/06/2021)    [place]     Feeling of Stress : Very much  Social Connections: Moderately Isolated (04/06/2021)    Social Connection and Isolation Panel [NHANES]     Frequency of Communication with Friends and Family: More than three times a week     Frequency of Social Gatherings with Friends and Family: More than three times a week     Attends Religious Services: Never     Marine scientist or Organizations: No     Attends Archivist Meetings: Never     Marital Status: Married  Human resources officer Violence: Not At Risk (04/06/2021)    Humiliation, Afraid, Rape, and Kick questionnaire     Fear of  Current or Ex-Partner: No     Emotionally Abused: No     Physically Abused: No     Sexually Abused: No          Allergies  Allergen Reactions   Flagyl [Metronidazole] Anaphylaxis   Cymbalta [Duloxetine Hcl]     Tape Rash            Current Outpatient Medications  Medication Sig Dispense Refill   amoxicillin-clavulanate (AUGMENTIN) 875-125 MG tablet Take 1 tablet by mouth 2 (two) times daily. 20 tablet 0   ASPIRIN LOW DOSE 81 MG tablet TAKE 1 TABLET BY MOUTH EVERY DAY 90 tablet 0   butalbital-acetaminophen-caffeine (FIORICET) 50-325-40 MG tablet Take 1 tablet by mouth 2 (two) times daily as needed for headache.  Cholecalciferol 250 MCG (10000 UT) CAPS Take by mouth.       cyanocobalamin (VITAMIN B12) 1000 MCG/ML injection Inject 1 mL (1,000 mcg total) into the muscle every 30 (thirty) days. 1 mL 11   diclofenac Sodium (VOLTAREN) 1 % GEL Apply 2 g topically 4 (four) times daily.       estradiol (ESTRACE) 1 MG tablet TAKE 1 TABLET EVERY DAY 90 tablet 1   etodolac (LODINE) 500 MG tablet Take 1 tablet by mouth 2 (two) times daily.       ezetimibe (ZETIA) 10 MG tablet TAKE 1 TABLET BY MOUTH ONCE DAILY 90 tablet 1   fluticasone (FLONASE) 50 MCG/ACT nasal spray SPRAY 2 SPRAYS INTO EACH NOSTRIL EVERY DAY 48 mL 0   furosemide (LASIX) 20 MG tablet Take 1 tablet (20 mg total) by mouth daily as needed for fluid or edema. 90 tablet 1   gabapentin (NEURONTIN) 600 MG tablet Take 1 tablet (600 mg total) by mouth in the morning, at noon, in the evening, and at bedtime. 360 tablet 1   hydrOXYzine (ATARAX/VISTARIL) 25 MG tablet TAKE 1/2 TO 1 TABLET BY MOUTH TWICE A DAY AS NEEDED FOR SEVERE PANIC ATTACKS/ ANXIETY 180 tablet 1   omeprazole (PRILOSEC) 40 MG capsule TAKE 1 CAPSULE BY MOUTH EVERY DAY 90 capsule 1   ondansetron (ZOFRAN ODT) 4 MG disintegrating tablet Take 1 tablet (4 mg total) by mouth every 8 (eight) hours as needed for nausea or vomiting. 30 tablet 2   oxyCODONE-acetaminophen (PERCOCET)  10-325 MG tablet Take 1 tablet by mouth 4 (four) times daily.       propranolol (INDERAL) 10 MG tablet Take 1 tablet (10 mg total) by mouth 3 (three) times daily as needed. For anxiety attacks 270 tablet 0   rizatriptan (MAXALT-MLT) 10 MG disintegrating tablet Take 1 tablet (10 mg total) by mouth as needed for migraine. May repeat in 2 hours if needed, that is max dose for 24 hours. 10 tablet 3   rosuvastatin (CRESTOR) 40 MG tablet TAKE 1 TABLET EVERY DAY 90 tablet 1   SHINGRIX injection         tiZANidine (ZANAFLEX) 4 MG tablet Take 1 tablet (4 mg total) by mouth 4 (four) times daily. 360 tablet 1   traZODone (DESYREL) 100 MG tablet TAKE ONE TABLET BY MOUTH AT BEDTIME FOR SLEEP 90 tablet 0   trimethoprim-polymyxin b (POLYTRIM) ophthalmic solution Place 1 drop into the right eye every 6 (six) hours. 10 mL 0   valACYclovir (VALTREX) 1000 MG tablet Take 1 tablet (1,000 mg total) by mouth daily. For 5-7 days, may repeat if need for cold sore 30 tablet 0   venlafaxine XR (EFFEXOR-XR) 75 MG 24 hr capsule TAKE ONE CAPSULE BY MOUTH DAILY WITH BREAKFAST 90 capsule 1   Zoledronic Acid (ZOMETA) 4 MG/100ML IVPB Once per year from Orthopedic 100 mL      No current facility-administered medications for this visit.      REVIEW OF SYSTEMS:  '[X]'$  denotes positive finding, '[ ]'$  denotes negative finding Cardiac   Comments:  Chest pain or chest pressure:      Shortness of breath upon exertion:      Short of breath when lying flat:      Irregular heart rhythm:             Vascular      Pain in calf, thigh, or hip brought on by ambulation:      Pain in feet at night  that wakes you up from your sleep:       Blood clot in your veins:      Leg swelling:              Pulmonary      Oxygen at home:      Productive cough:       Wheezing:              Neurologic      Sudden weakness in arms or legs:       Sudden numbness in arms or legs:       Sudden onset of difficulty speaking or slurred speech:       Temporary loss of vision in one eye:       Problems with dizziness:              Gastrointestinal      Blood in stool:       Vomited blood:              Genitourinary      Burning when urinating:       Blood in urine:             Psychiatric      Major depression:              Hematologic      Bleeding problems:      Problems with blood clotting too easily:             Skin      Rashes or ulcers:             Constitutional      Fever or chills:          PHYSICAL EXAM:    Vitals:    11/14/21 1406  BP: (!) 158/94  Pulse: 73  Resp: 20  Temp: 98 F (36.7 C)  SpO2: 93%  Weight: 198 lb (89.8 kg)  Height: '5\' 2"'$  (1.575 m)      GENERAL: The patient is a well-nourished female, in no acute distress. The vital signs are documented above. CARDIAC: There is a regular rate and rhythm.  VASCULAR:  Palpable femoral pulses bilaterally Palpable AT pulses bilaterally PULMONARY: No respiratory distress. ABDOMEN: Soft and non-tender.  Previous abdominoplasty incision.   MUSCULOSKELETAL: There are no major deformities or cyanosis. NEUROLOGIC: No focal weakness or paresthesias are detected. PSYCHIATRIC: The patient has a normal affect.   DATA:  MRI Lumbar reviewed 08/15/21    Assessment/Plan:   67 year old female with chronic lower back pain with right leg radiculopathy that vascular surgery has been asked to evaluate for anterior spine exposure for L5-S1 ALIF.  I have reviewed her MRI imaging and I think she would be an appropriate candidate for anterior approach.  She has had a significant amount of abdominal surgery that could make anterior exposure more tedious including appendectomy, hysterectomy, cholecystectomy, C-section, gastric bypass and abdominoplasty.  I discussed transverse incision over the left rectus muscle and mobilizing the rectus muscles to enter into the retroperitoneum and mobilize peritoneum and left ureter across midline.  Discussed mobilizing iliac artery and  vein if needed. Discussed risk of injury the above structures including iliac vein or artery.  All questions answered.  She is scheduled for 12/18/2021.     Marty Heck, MD [facility] Office: [phone]

## 2021-12-18 NOTE — Op Note (Signed)
12/18/2021  3:38 PM  PATIENT:  Ashley Fields  66 y.o. female  PRE-OPERATIVE DIAGNOSIS:  1.  Listhesis and synovial cyst, recurrent with right L5 radiculopathy, 2.  Previous decompression and instrumented fusion L3-4 L4-5, 3.  Adjacent level spinal stenosis with retrolisthesis L2-3 with back and leg pain  POST-OPERATIVE DIAGNOSIS:  same  PROCEDURE:   1. Decompressive lumbar laminectomy, hemi facetectomy and foraminotomies L2-3 requiring more work than would be required for a simple exposure of the disk for PLIF in order to adequately decompress the neural elements and address the spinal stenosis 2. Posterior lumbar interbody fusion L2-3 using PTI interbody cages packed with morcellized allograft and autograft  3. Posterior fixation L2-3 using ATEC pedicle screws.  4. Intertransverse arthrodesis L2-3 bilaterally using morcellized autograft and allograft. 5.  Anterior lumbar interbody fusion L5-S1 utilizing a globus interbody cage and morselized allograft 6.  Gill decompression on the right L5-S1 to decompress the right L5 nerve root and remove recurrent synovial cyst 7.  Posterior fixation L5-S1 utilizing ATEC pedicle screws 8.  Intertransverse arthrodesis L5-S1 right utilizing morselized autograft and morselized allograft 9.  Exploration of fusion L3-4 to assure solid arthrodesis with removal of nonsegmental instrumentation  SURGEON:  Sherley Bounds, MD  ASSISTANTS: Glenford Peers FNP  ANESTHESIA:  General  EBL: 600 ml  Total I/O In: 1250 [I.V.:1000; Blood:250] Out: 1829 [Urine:820; Blood:600]  BLOOD ADMINISTERED: 250 CC CELLSAVER  DRAINS: none   INDICATION FOR PROCEDURE: This patient presented with back pain and right leg pain. Imaging revealed previous hemilaminectomy at L5-S1 on the right, previous fusion L4-5, previous fusion L3-4 with instrumentation with adjacent level stenosis at L2-3 with retrolisthesis, lytic spondylolisthesis L5-S1 with right foraminal stenosis. The patient  tried a reasonable attempt at conservative medical measures without relief. I recommended decompression and instrumented fusion to address the stenosis as well as the segmental  instability.  Patient understood the risks, benefits, and alternatives and potential outcomes and wished to proceed.  PROCEDURE DETAILS:  The patient was brought to the operating room. After induction of generalized endotracheal anesthesia the patient was placed in the supine position for the anterior lumbar interbody fusion.  The exposure was performed by Dr. Carlis Abbott of vascular surgery and this is described in a separate operative report.  A needle was placed in the L5-S1 disc base and the midline was marked.  We checked lateral fluoroscopy to assure we are L5-S1.  The annulus was incised and the initial discectomy was done with pituitary rongeurs once the annulus was released from the endplates with a Cobb elevator.  I then used Metzenbaums to remove the remaining disc from the endplates at H3-Z1.  I drilled the deep in the disc base to prepare the endplates for arthrodesis and to prepare the endplates to except the interbody cage.  We used sequential trials in the 13 mm medium trial fit the best.  Therefore we used a corresponding cage and packed this with morselized allograft, placed on the inserter and then inserted the cage utilizing lateral fluoroscopy utilizing the inserter.  Once we felt it was in good position we deployed the anchors, 1 into L5 and 2 into the sacrum.  The inserter was then removed and the anchors were locked into position.  We then checked our final construct with AP and lateral fluoroscopy.  We got a final x-ray to her surgeon there were no retained instruments or sponges and this was read by the radiologist.  Retractor was removed by Dr. Carlis Abbott.  We closed  the fascia with a running 1-0 Prolene, the subcutaneous tissues with 2-0 Vicryl.  The subcuticular tissue with 3-0 Vicryl and the skin was closed with  Dermabond.  The drapes were removed.  The final sponge needle and instrument count was correct.  The patient was then positioned for the posterior part of the operation.    She was then rolled into the prone position on chest rolls and all pressure points were padded. The patient's lumbar region was cleaned and then prepped with DuraPrep and draped in the usual sterile fashion. Anesthesia was injected and then a dorsal midline incision was made and carried down to the lumbosacral fascia. The fascia was opened and the paraspinous musculature was taken down in a subperiosteal fashion to expose L2-3, the previously placed instrumentation at L3-4, the posterior lateral fusion L4-5 as well as the posterior elements at L5-S1.Marland Kitchen A self-retaining retractor was placed. Intraoperative fluoroscopy confirmed my level, and I started with exploration of the fusion and removal of the nonsegmental instrumentation at L3-4.  There was excellent posterior lateral fusion between the processes at L3-4 and L4-5 bilaterally.  Locking caps were removed from the pedicle screws and the rods were removed.  We then removed all 4 pedicle screws.  They all had good purchase.   I then turned my attention to the posterior decompression and instrumented fusion at L5-S1.  We localized the transverse processes of L5 and the sacral ala.  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  We removed the inferior facet bilaterally.  We identified the L5 nerve root and performed a generous foraminotomies over the L5 nerve root on the right because of her right L5 radicular pain.  Once the decompression was complete the coronary dilator passed easily along the L5 nerve root.  It appeared to be well decompressed.  We used AP and lateral fluoroscopy to identify the pedicle screw entry zones at L5 and S1.  I was able to find the old pedicle screw holes at L5 and these were used.  Tapped these holes with a 5.5 tap and then placed 6.5 x 45 mm pedicle  screws at L5.  We then used the pedicle probe to probe the S1 pedicle, tapped this with a 5 5 tap, palpated with a ball probe, and then placed 6.5 x 40 mm pedicle screws at S1 bilaterally.  Then placed lordotic rods into the multiaxial screw heads of the pedicle screws and locked these into position utilizing the antitorque device.   We then turned our attention to the posterior lumbar interbody fusion at L2-3 and the decompression and complete lumbar laminectomies, hemi- facetectomies, and foraminotomies were performed at L2-3.  My nurse practitioner was directly involved in the decompression and exposure of the neural elements. the patient had significant spinal stenosis and this required more work than would be required for a simple exposure of the disc for posterior lumbar interbody fusion which would only require a limited laminotomy. Much more generous decompression and generous foraminotomy was undertaken in order to adequately decompress the neural elements and address the patient's leg pain. The yellow ligament was removed to expose the underlying dura and nerve roots, and generous foraminotomies were performed to adequately decompress the neural elements. Both the exiting and traversing nerve roots were decompressed on both sides until a coronary dilator passed easily along the nerve roots.  Unfortunately, while removing a piece of bone from the foramen beneath the left L2 pedicle a tiny unintended durotomy was created  with the bone.  I sewed this with 6-0 Prolene but there was continued slight leakage around the stitch.  Placed another 6-0 Prolene but again there was slight leakage around the stitch hole.  Once the decompression was complete, I turned my attention to the posterior lower lumbar interbody fusion. The epidural venous vasculature was coagulated and cut sharply. Disc space was incised and the initial discectomy was performed with pituitary rongeurs. The disc space was distracted with  sequential distractors to a height of 10 mm. We then used a series of scrapers and shavers to prepare the endplates for fusion. The midline was prepared with Epstein curettes. Once the complete discectomy was finished, we packed an appropriate sized interbody cage with local autograft and morcellized allograft, gently retracted the nerve root, and tapped the cage into position at L2-3.  The midline between the cages was packed with morselized autograft and allograft.   We then turned our attention to the placement of the upper pedicle screws.  Placed 6.5 x 45 mm pedicle screws into the old L3 screw holes.  The pedicle screw entry zones were identified utilizing surface landmarks and fluoroscopy. I drilled into each pedicle utilizing the hand drill, and tapped each pedicle with the appropriate tap. We palpated with a ball probe to assure no break in the cortex. We then placed 6.5 x 45 mm pedicle screws into the pedicles bilaterally at L2.  My nurse practitioner assisted in placement of the pedicle screws.  We then decorticated the transverse processes and laid a mixture of morcellized autograft and allograft out over these to perform intertransverse arthrodesis at L2-3 bilaterally as well as L5-S1 on the right. We then placed lordotic rods into the multiaxial screw heads of the pedicle screws L2-3 and locked these in position with the locking caps and anti-torque device. We then checked our construct with AP and lateral fluoroscopy. Irrigated with copious amounts of bacitracin-containing saline solution. Inspected the nerve roots once again to assure adequate decompression, lined to the dura with Gelfoam, lined the durotomy repair with Tisseel fibrin glue, muscle and Gelfoam, and then we closed the muscle and the fascia with 0 Vicryl. Closed the subcutaneous tissues with 2-0 Vicryl and subcuticular tissues with 3-0 Vicryl. The skin was closed with Dermabond and benzoin and Steri-Strips. Dressing was then applied,  the patient was awakened from general anesthesia and transported to the recovery room in stable condition. At the end of the procedure all sponge, needle and instrument counts were correct.   PLAN OF CARE: admit to inpatient  PATIENT DISPOSITION:  PACU - hemodynamically stable.   Delay start of Pharmacological VTE agent (>24hrs) due to surgical blood loss or risk of bleeding:  yes

## 2021-12-19 NOTE — Progress Notes (Signed)
Patient ID: Ashley Fields, female   DOB: 15-Sep-1955, 66 y.o.   MRN: [id number] Subjective: Patient reports appropriate back soreness, no leg pain or NTW, sitting up eating and has been walking the the BR  Objective: Vital signs in last 24 hours: Temp:  [97.6 F (36.4 C)-99.2 F (37.3 C)] 99.2 F (37.3 C) (12/12 0317) Pulse Rate:  [66-102] 85 (12/12 0317) Resp:  [11-18] 18 (12/12 0317) BP: (98-178)/(58-93) 147/67 (12/12 0317) SpO2:  [91 %-100 %] 96 % (12/12 0317) Arterial Line BP: (183-185)/(86-91) 185/91 (12/11 1600)  Intake/Output from previous day: 12/11 0701 - 12/12 0700 In: 2839 [P.O.:480; I.V.:2009; Blood:250; IV Piggyback:100] Out: 1570 [Urine:970; Blood:600] Intake/Output this shift: No intake/output data recorded.  Neurologic: Grossly normal  Lab Results: Lab Results  Component Value Date   WBC 5.1 11/15/2021   HGB 14.5 11/15/2021   HCT 45.0 11/15/2021   MCV 94.5 11/15/2021   PLT 171 11/15/2021   Lab Results  Component Value Date   INR 1.0 11/15/2021   BMET Lab Results  Component Value Date   NA 143 11/15/2021   K 4.2 11/15/2021   CL 110 11/15/2021   CO2 30 11/15/2021   GLUCOSE 94 11/15/2021   BUN 16 11/15/2021   CREATININE 0.69 11/15/2021   CALCIUM 9.0 11/15/2021    Studies/Results: DG Lumbar Spine 2-3 Views  Result Date: 12/18/2021 CLINICAL DATA:  Fluoroscopic assistance for lumbar spine fusion EXAM: LUMBAR SPINE - 2-3 VIEW COMPARISON:  08/08/2021 FINDINGS: Fluoroscopic images show surgical fusion at L2-L3 and L5-S1 levels. Intervertebral disc spaces are noted at both levels. There is removal of surgical hardware at L3-L4 level. Fluoroscopic time 1 minutes and 56 seconds. Radiation dose 123.42 mGy. IMPRESSION: Fluoroscopic assistance was provided for surgical fusion at L2-L3 and L5-S1 levels. Electronically Signed   By: Elmer Picker M.D.   On: 12/18/2021 15:06   DG C-Arm 1-60 Min-No Report  Result Date: 12/18/2021 Fluoroscopy was utilized by  the requesting physician.  No radiographic interpretation.   DG C-Arm 1-60 Min-No Report  Result Date: 12/18/2021 Fluoroscopy was utilized by the requesting physician.  No radiographic interpretation.   DG C-Arm 1-60 Min-No Report  Result Date: 12/18/2021 Fluoroscopy was utilized by the requesting physician.  No radiographic interpretation.   DG OR LOCAL ABDOMEN  Result Date: 12/18/2021 CLINICAL DATA:  [id number] Surgery, elective [id number] EXAM: OR LOCAL ABDOMEN COMPARISON:  None Available. FINDINGS: Intraoperative frontal radiograph centered at L5-S1 during anterior fusion. There are 2 surgical clips, 1 just above and 1 below the fusion level. Prior L3-L4 fusion is noted. No other metallic foreign body identified. IMPRESSION: Intraoperative frontal radiograph centered at L5-S1 during anterior fusion. Two surgical clips, 1 just above and 1 below the L5-S1 fusion. No other metallic foreign body. These results were called by telephone at the time of interpretation on 12/18/2021 at 10:06 am OR 18 staff, who verbally acknowledged these results. Electronically Signed   By: Maurine Simmering M.D.   On: 12/18/2021 10:08    Assessment/Plan: Doing well, mobilize today with PT/OT, home tomorrow if pain controlled   Estimated body mass index is 32.56 kg/m as calculated from the following:   Height as of this encounter: '5\' 2"'$  (1.575 m).   Weight as of this encounter: 80.7 kg.    LOS: 1 day    Eustace Moore 12/19/2021, 7:41 AM

## 2021-12-19 NOTE — Evaluation (Signed)
Occupational Therapy Evaluation Patient Details Name: Ashley Fields MRN: [id number] DOB: 17-Apr-1955 Today's Date: 12/19/2021   History of Present Illness 66 y.o. female admitted for hemi facetectomy and foraminotomies L2-3, PLIF L2-3, ALIF  L5-S1, intraoperative unintended durotomy s/p repair. 12/18/21. PMHx anxiety, fibromyalgia, CKD, Hep C, osteoporosis, thyroid disease.   Clinical Impression   Patient evaluated by Occupational Therapy with no further acute OT needs identified. All education has been completed and the patient has no further questions. See below for any follow-up Occupational Therapy or equipment needs. OT to sign off. Thank you for referral.   Pt would greatly benefit from AE to maximize indep with dressing. Pt with old back brace in room and aware of don doff with clothing between skin and brace       Recommendations for follow up therapy are one component of a multi-disciplinary discharge planning process, led by the attending physician.  Recommendations may be updated based on patient status, additional functional criteria and insurance authorization.   Follow Up Recommendations  No OT follow up     Assistance Recommended at Discharge None  Patient can return home with the following      Functional Status Assessment  Patient has had a recent decline in their functional status and demonstrates the ability to make significant improvements in function in a reasonable and predictable amount of time.  Equipment Recommendations  None recommended by OT    Recommendations for Other Services       Precautions / Restrictions Precautions Precautions: Back Precaution Booklet Issued: Yes (comment) Precaution Comments: reviewed adl and provided handout Required Braces or Orthoses: Spinal Brace Spinal Brace: Lumbar corset;Applied in sitting position Restrictions Weight Bearing Restrictions: No      Mobility Bed Mobility Overal bed mobility: Needs Assistance Bed  Mobility: Rolling, Supine to Sit Rolling: Min guard   Supine to sit: Min assist     General bed mobility comments: pt needs increased time and effort to roll from back to side. pt with vc able to progress from supine to sitting with min (A) for power up of trunk.    Transfers Overall transfer level: Needs assistance Equipment used: Rolling walker (2 wheels) Transfers: Sit to/from Stand Sit to Stand: Supervision                  Balance Overall balance assessment: Mild deficits observed, not formally tested                                         ADL either performed or assessed with clinical judgement   ADL Overall ADL's : Needs assistance/impaired Eating/Feeding: Independent   Grooming: Independent           Upper Body Dressing : Independent   Lower Body Dressing: Adhering to back precautions;Sit to/from stand;With adaptive equipment Lower Body Dressing Details (indicate cue type and reason): pt educated on sock aide and reacher use. pt plans to purchase. Toilet Transfer: Warehouse manager and Hygiene: Supervision/safety Toileting - Clothing Manipulation Details (indicate cue type and reason): pt educated on toilet tongs and tushy biget adapter for commode for home use. pt expressed interest in purchase of both     Functional mobility during ADLs: Supervision/safety General ADL Comments: pt demonstrates dressing with AE and expressed need to purchase or use spouse upon d;c     Vision Baseline Vision/History: 0 No visual deficits  Patient Visual Report: No change from baseline       Perception     Praxis      Pertinent Vitals/Pain Pain Assessment Pain Assessment: Faces Faces Pain Scale: Hurts little more Pain Location:  (incision L groin area) Pain Descriptors / Indicators: Sore Pain Intervention(s): Monitored during session, Premedicated before session, Repositioned     Hand Dominance Right    Extremity/Trunk Assessment Upper Extremity Assessment Upper Extremity Assessment: Overall WFL for tasks assessed   Lower Extremity Assessment Lower Extremity Assessment: Defer to PT evaluation   Cervical / Trunk Assessment Cervical / Trunk Assessment: Back Surgery   Communication Communication Communication: No difficulties   Cognition Arousal/Alertness: Awake/alert Behavior During Therapy: WFL for tasks assessed/performed Overall Cognitive Status: Within Functional Limits for tasks assessed                                       General Comments  incision are dry and intact at this time. pt with L LE weakness due to incision site and could benefit from AE to adapt    Exercises     Shoulder Instructions      Home Living Family/patient expects to be discharged to:: Private residence Living Arrangements: Spouse/significant other Available Help at Discharge: Family;Available 24 hours/day Type of Home: House Home Access: Stairs to enter [employer] of Steps: 6 Entrance Stairs-Rails: Right;Left Home Layout: One level     Bathroom Shower/Tub: Teacher, early years/pre: Standard     Home Equipment: Rollator (4 wheels);Grab bars - tub/shower;Cane - single point   Additional Comments: pt has 9 steps to sideway to reach front door and 5-6 to enter the home. pt with easier access to the back door. 2 pitbull dogs that spouse can manage      Prior Functioning/Environment Prior Level of Function : Driving;Independent/Modified Independent             Mobility Comments: Started to use a [facility] for balance recently. ADLs Comments: has spouse to help with LB as needed        OT Problem List:        OT Treatment/Interventions:      OT Goals(Current goals can be found in the care plan section) Acute Rehab OT Goals Patient Stated Goal: to go home tomorrow  OT Frequency:      Co-evaluation              AM-PAC OT "6 Clicks" Daily  Activity     Outcome Measure Help from another person eating meals?: None Help from another person taking care of personal grooming?: None Help from another person toileting, which includes using toliet, bedpan, or urinal?: None Help from another person bathing (including washing, rinsing, drying)?: None Help from another person to put on and taking off regular upper body clothing?: None Help from another person to put on and taking off regular lower body clothing?: None 6 Click Score: 24   End of Session Nurse Communication: Mobility status;Precautions  Activity Tolerance: Patient tolerated treatment well Patient left: in bed;with call bell/phone within reach  OT Visit Diagnosis: Unsteadiness on feet (R26.81)                Time: 3710-6269 OT Time Calculation (min): 25 min Charges:  OT General Charges $OT Visit: 1 Visit OT Evaluation $OT Eval Moderate Complexity: 1 Mod   Brynn, OTR/L  Acute Rehabilitation Services Office:  [phone] .   Jeri Modena 12/19/2021, 11:33 AM

## 2021-12-19 NOTE — Progress Notes (Signed)
[facility]  Subjective  - no complaints.     Objective (!) 147/67 85 99.2 F (37.3 C) (Oral) 18 96%  Intake/Output Summary (Last 24 hours) at 12/19/2021 0630 Last data filed at 12/19/2021 0511 Gross per 24 hour  Intake 2838.98 ml  Output 1570 ml  Net 1268.98 ml    Abdomen with appropriate postop incisional tenderness Abdominal incision clean dry intact Left DP palpable  Laboratory Lab Results: No results for input(s): "WBC", "HGB", "HCT", "PLT" in the last 72 hours. BMET No results for input(s): "NA", "K", "CL", "CO2", "GLUCOSE", "BUN", "CREATININE", "CALCIUM" in the last 72 hours.  COAG Lab Results  Component Value Date   INR 1.0 11/15/2021   No results found for: "PTT"  Assessment/Planning:  Postop day 1 status post anterior spine exposure for L5-S1 ALIF.  Looks good this morning.  Appropriate postop abdominal tenderness.  Palpable pedal pulse in the left foot.  No concerns from a vascular standpoint.  Marty Heck 12/19/2021 6:30 AM --

## 2021-12-19 NOTE — Evaluation (Signed)
Physical Therapy Evaluation Patient Details Name: Ashley Fields MRN: [id number] DOB: 12-Nov-1955 Today's Date: 12/19/2021  History of Present Illness  66 y.o. female admitted for hemi facetectomy and foraminotomies L2-3, PLIF L2-3, ALIF  L5-S1, intraoperative unintended durotomy s/p repair. 12/18/21. PMHx anxiety, fibromyalgia, CKD, Hep C, osteoporosis, thyroid disease.  Clinical Impression  Patient is s/p above surgery resulting in functional limitations due to the deficits listed below (see PT Problem List). Safely and adequately tolerated transfer, gait 200' x2, and stair training without incidence. Feels confident with tasks. Staying admitted another night due to complexity of surgery and durotomy repair. Will follow up in AM to insure that she is still mobilizing safely. Patient will benefit from skilled PT to increase their independence and safety with mobility to allow discharge to the venue listed below.          Recommendations for follow up therapy are one component of a multi-disciplinary discharge planning process, led by the attending physician.  Recommendations may be updated based on patient status, additional functional criteria and insurance authorization.  Follow Up Recommendations No PT follow up      Assistance Recommended at Discharge Set up Supervision/Assistance  Patient can return home with the following  Assist for transportation;Help with stairs or ramp for entrance    Equipment Recommendations None recommended by PT  Recommendations for Other Services       Functional Status Assessment Patient has had a recent decline in their functional status and demonstrates the ability to make significant improvements in function in a reasonable and predictable amount of time.     Precautions / Restrictions Precautions Precautions: Back Precaution Booklet Issued: Yes (comment) Precaution Comments: reviewed Required Braces or Orthoses: Spinal Brace Spinal Brace: Lumbar  corset;Applied in sitting position Restrictions Weight Bearing Restrictions: No      Mobility  Bed Mobility               General bed mobility comments: sitting EOB    Transfers Overall transfer level: Needs assistance Equipment used: Rolling walker (2 wheels) Transfers: Sit to/from Stand Sit to Stand: Supervision           General transfer comment: supervision for safety, minor effort  to rise and lower to/from bed. Stable once upright with light UE support.    Ambulation/Gait Ambulation/Gait assistance: Supervision Gait Distance (Feet): 200 Feet (x2) Assistive device: Rolling walker (2 wheels) Gait Pattern/deviations: Step-through pattern, Decreased stride length, Trendelenburg Gait velocity: decreased Gait velocity interpretation: <1.8 ft/sec, indicate of risk for recurrent falls   General Gait Details: Educated on safe AD use with RW. Very slight trendelenburg on Lt, noted more with stair training. No overt LOB or buckling noted, lightly using RW for support today.  Stairs Stairs: Yes Stairs assistance: Supervision Stair Management: One rail Right, Alternating pattern, Step to pattern, Forwards, Sideways Number of Stairs: 10 General stair comments: Safely completed stair training. VC for technique, demonstration provided. Pt initially alternating step pattern, cues for step-to for safety, single rail one hand going up. Educated on lateral steps going down for safety and improved control. Pt feels comfortable and confident with this task today.  Wheelchair Mobility    Modified Rankin (Stroke Patients Only)       Balance Overall balance assessment: Mild deficits observed, not formally tested  Pertinent Vitals/Pain Pain Assessment Pain Assessment: 0-10 Pain Score: 2  Pain Location: incisional area, back, and between shoulders Pain Descriptors / Indicators: Aching, Constant Pain  Intervention(s): Limited activity within patient's tolerance, Monitored during session, Repositioned    Home Living Family/patient expects to be discharged to:: Private residence Living Arrangements: Spouse/significant other Available Help at Discharge: Family;Available 24 hours/day Type of Home: House Home Access: Stairs to enter Entrance Stairs-Rails: Psychiatric nurse of Steps: 6   Home Layout: One level Home Equipment: Rollator (4 wheels);Grab bars - tub/shower;Cane - single point (lift chair)      Prior Function Prior Level of Function : Driving;Independent/Modified Independent             Mobility Comments: Started to use a [facility] for balance recently.       Hand Dominance        Extremity/Trunk Assessment   Upper Extremity Assessment Upper Extremity Assessment: Defer to OT evaluation    Lower Extremity Assessment Lower Extremity Assessment: Generalized weakness       Communication   Communication: No difficulties  Cognition Arousal/Alertness: Awake/alert Behavior During Therapy: WFL for tasks assessed/performed Overall Cognitive Status: Within Functional Limits for tasks assessed                                          General Comments General comments (skin integrity, edema, etc.): Reviwed prec and practiced donning/doffing brace    Exercises     Assessment/Plan    PT Assessment Patient needs continued PT services  PT Problem List Decreased strength;Decreased range of motion;Decreased activity tolerance;Decreased balance;Decreased mobility;Decreased knowledge of use of DME;Decreased knowledge of precautions;Pain;Obesity       PT Treatment Interventions DME instruction;Gait training;Stair training;Functional mobility training;Therapeutic activities;Therapeutic exercise;Balance training;Neuromuscular re-education;Patient/family education    PT Goals (Current goals can be found in the Care Plan section)  Acute Rehab  PT Goals Patient Stated Goal: Get well, go home PT Goal Formulation: With patient Time For Goal Achievement: 12/26/21 Potential to Achieve Goals: Good    Frequency Min 4X/week     Co-evaluation               AM-PAC PT "6 Clicks" Mobility  Outcome Measure Help needed turning from your back to your side while in a flat bed without using bedrails?: None Help needed moving from lying on your back to sitting on the side of a flat bed without using bedrails?: A Little Help needed moving to and from a bed to a chair (including a wheelchair)?: A Little Help needed standing up from a chair using your arms (e.g., wheelchair or bedside chair)?: A Little Help needed to walk in hospital room?: A Little Help needed climbing 3-5 steps with a railing? : A Little 6 Click Score: 19    End of Session Equipment Utilized During Treatment: Gait belt;Back brace Activity Tolerance: Patient tolerated treatment well Patient left: in bed;with call bell/phone within reach Nurse Communication: Mobility status PT Visit Diagnosis: Unsteadiness on feet (R26.81);Other abnormalities of gait and mobility (R26.89);Muscle weakness (generalized) (M62.81);Difficulty in walking, not elsewhere classified (R26.2);Pain Pain - part of body:  (back)    Time: 1324-4010 PT Time Calculation (min) (ACUTE ONLY): 21 min   Charges:   PT Evaluation $PT Eval Low Complexity: 1 Low          Candie Mile, PT, DPT Physical Therapist Acute Rehabilitation Services [place] 12/19/2021, 10:24 AM

## 2021-12-20 MED ORDER — OXYCODONE-ACETAMINOPHEN 10-325 MG PO TABS: 1.0000 | ORAL_TABLET | ORAL | 0 refills | Status: DC | PRN

## 2021-12-20 NOTE — Progress Notes (Signed)
  Progress Note    12/20/2021 8:09 AM 2 Days Post-Op  Subjective:  no complaints.  Says she has walked this morning.  Denies any pain in her feet.   Afebrile   Vitals:   12/19/21 2301 12/20/21 0405  BP: (!) 142/69 103/82  Pulse: 66 70  Resp: 18 18  Temp: 98.3 F (36.8 C) 98.4 F (36.9 C)  SpO2: 96% 98%    Physical Exam: General:  no distress Cardiac:  regular Lungs:  non labored Incisions:  clean and dry Extremities:  palpable DP pulses bilaterally Abdomen:  soft  CBC    Component Value Date/Time   WBC 5.1 11/15/2021 1110   RBC 4.76 11/15/2021 1110   HGB 14.5 11/15/2021 1110   HCT 45.0 11/15/2021 1110   PLT 171 11/15/2021 1110   MCV 94.5 11/15/2021 1110   MCH 30.5 11/15/2021 1110   MCHC 32.2 11/15/2021 1110   RDW 12.2 11/15/2021 1110   LYMPHSABS 1.7 09/01/2021 0813   MONOABS 0.4 09/01/2021 0813   EOSABS 0.2 09/01/2021 0813   BASOSABS 0.0 09/01/2021 0813    BMET    Component Value Date/Time   NA 143 11/15/2021 1110   NA 139 08/14/2016 1508   K 4.2 11/15/2021 1110   CL 110 11/15/2021 1110   CO2 30 11/15/2021 1110   GLUCOSE 94 11/15/2021 1110   BUN 16 11/15/2021 1110   BUN 17 08/14/2016 1508   CREATININE 0.69 11/15/2021 1110   CREATININE 1.07 (H) 04/28/2021 0824   CALCIUM 9.0 11/15/2021 1110   GFRNONAA >60 11/15/2021 1110   GFRNONAA 63 03/30/2020 0802   GFRAA 73 03/30/2020 0802    INR    Component Value Date/Time   INR 1.0 11/15/2021 1110     Intake/Output Summary (Last 24 hours) at 12/20/2021 0809 Last data filed at 12/19/2021 2229 Gross per 24 hour  Intake 240 ml  Output --  Net 240 ml      Assessment/Plan:  66 y.o. female is s/p:  Anterior spine exposure of the L5-S1 disc space via anterior retroperitoneal approach for L5-S1 ALIF   2 Days Post-Op   -pt doing well this  am with palpable DP pulses bilaterally -incision looks good.   -she has ambulated this am  -doing well from vascular standpoint.  F/u as needed   Leontine Locket, PA-C [facility] [phone] 12/20/2021 8:09 AM

## 2021-12-20 NOTE — Progress Notes (Signed)
Physical Therapy Treatment and Discharge  Patient Details Name: Ashley Fields MRN: [id number] DOB: 1955/08/31 Today's Date: 12/20/2021   History of Present Illness Pt is a 66 y.o. female admitted for hemi facetectomy and foraminotomies L2-3, PLIF L2-3, ALIF  L5-S1, intraoperative unintended durotomy s/p repair. 12/18/21. PMHx anxiety, fibromyalgia, CKD, Hep C, osteoporosis, thyroid disease.    PT Comments    Pt progressing well with post-op mobility. She was able to demonstrate transfers and ambulation with gross modified independence and RW for support. Pt was educated on precautions, brace application/wearing schedule, appropriate activity progression, and car transfer. Pt has met acute PT goals and is discharged from acute PT services at this time. If needs change, please reconsult.     Recommendations for follow up therapy are one component of a multi-disciplinary discharge planning process, led by the attending physician.  Recommendations may be updated based on patient status, additional functional criteria and insurance authorization.  Follow Up Recommendations  No PT follow up     Assistance Recommended at Discharge Set up Supervision/Assistance  Patient can return home with the following Assist for transportation;Help with stairs or ramp for entrance   Equipment Recommendations  None recommended by PT    Recommendations for Other Services       Precautions / Restrictions Precautions Precautions: Back Precaution Booklet Issued: Yes (comment) Precaution Comments: reviewed adl and provided handout Required Braces or Orthoses: Spinal Brace Spinal Brace: Lumbar corset;Applied in sitting position Restrictions Weight Bearing Restrictions: No     Mobility  Bed Mobility Overal bed mobility: Modified Independent Bed Mobility: Rolling, Sit to Sidelying           General bed mobility comments: Pt able to transition back to supine at end of session. No assist required and  overall good log roll technique.    Transfers Overall transfer level: Modified independent Equipment used: Rolling walker (2 wheels) Transfers: Sit to/from Stand             General transfer comment: Pt demonstrated good posture with sit<>stand. Good hand placement on seated surface for safety.    Ambulation/Gait Ambulation/Gait assistance: Modified independent (Device/Increase time) Gait Distance (Feet): 560 Feet Assistive device: Rolling walker (2 wheels) Gait Pattern/deviations: Step-through pattern, Decreased stride length, Trendelenburg Gait velocity: decreased Gait velocity interpretation: <1.8 ft/sec, indicate of risk for recurrent falls   General Gait Details: VC's throughout for improved posture, closer walker proximity, and forward gaze. No assist required.   Stairs Stairs: Yes Stairs assistance: Supervision Stair Management: One rail Right, Alternating pattern, Step to pattern, Sideways Number of Stairs: 6 General stair comments: Pt motivated to practice stairs again. Negotiated sideways without difficulty. VC's throughout for maintenance of precautions.   Wheelchair Mobility    Modified Rankin (Stroke Patients Only)       Balance Overall balance assessment: Mild deficits observed, not formally tested                                          Cognition Arousal/Alertness: Awake/alert Behavior During Therapy: WFL for tasks assessed/performed Overall Cognitive Status: Within Functional Limits for tasks assessed                                          Exercises      General Comments  Pertinent Vitals/Pain Pain Assessment Pain Assessment: Faces Faces Pain Scale: Hurts little more Pain Location:  (incision L groin area) Pain Descriptors / Indicators: Sore Pain Intervention(s): Limited activity within patient's tolerance, Monitored during session, Repositioned    Home Living Family/patient expects to be  discharged to:: Private residence Living Arrangements: Spouse/significant other Available Help at Discharge: Family;Available 24 hours/day Type of Home: House Home Access: Stairs to enter Entrance Stairs-Rails: Psychiatric nurse of Steps: 6   Home Layout: One level Home Equipment: Rollator (4 wheels);Grab bars - tub/shower;Cane - single point Additional Comments: pt has 9 steps to sideway to reach front door and 5-6 to enter the home. pt with easier access to the back door. 2 pitbull dogs that spouse can manage    Prior Function            PT Goals (current goals can now be found in the care plan section) Acute Rehab PT Goals Patient Stated Goal: Get well, go home PT Goal Formulation: With patient Time For Goal Achievement: 12/26/21 Potential to Achieve Goals: Good Progress towards PT goals: Progressing toward goals    Frequency    Min 5X/week      PT Plan Current plan remains appropriate    Co-evaluation              AM-PAC PT "6 Clicks" Mobility   Outcome Measure  Help needed turning from your back to your side while in a flat bed without using bedrails?: None Help needed moving from lying on your back to sitting on the side of a flat bed without using bedrails?: None Help needed moving to and from a bed to a chair (including a wheelchair)?: None Help needed standing up from a chair using your arms (e.g., wheelchair or bedside chair)?: None Help needed to walk in hospital room?: None Help needed climbing 3-5 steps with a railing? : A Little 6 Click Score: 23    End of Session Equipment Utilized During Treatment: Gait belt;Back brace Activity Tolerance: Patient tolerated treatment well Patient left: in bed;with call bell/phone within reach Nurse Communication: Mobility status PT Visit Diagnosis: Unsteadiness on feet (R26.81);Other abnormalities of gait and mobility (R26.89);Muscle weakness (generalized) (M62.81);Difficulty in walking, not  elsewhere classified (R26.2);Pain Pain - part of body:  (back)     Time: 4599-7741 PT Time Calculation (min) (ACUTE ONLY): 30 min  Charges:  $Gait Training: 23-37 mins                     Ashley Fields, PT, DPT Acute Rehabilitation Services Secure Chat Preferred Office: [phone]    Ashley Fields 12/20/2021, 9:53 AM

## 2021-12-20 NOTE — Discharge Summary (Signed)
Physician Discharge Summary  Patient ID: Ashley Fields MRN: [id number] DOB/AGE: 1955/12/23 66 y.o.  Admit date: 12/18/2021 Discharge date: 12/20/2021  Admission Diagnoses:  1.  Listhesis and synovial cyst, recurrent with right L5 radiculopathy, 2.  Previous decompression and instrumented fusion L3-4 L4-5, 3.  Adjacent level spinal stenosis with retrolisthesis L2-3 with back and leg pain     Discharge Diagnoses: same   Discharged Condition: good  Hospital Course: The patient was admitted on 12/18/2021 and taken to the operating room where the patient underwent PLIF L2-3, ALIF L5-S1 and gill decompression on the right at L5-S1. The patient tolerated the procedure well and was taken to the recovery room and then to the floor in stable condition. The hospital course was routine. There were no complications. The wound remained clean dry and intact. Pt had appropriate back soreness. No complaints of leg pain or new N/T/W. The patient remained afebrile with stable vital signs, and tolerated a regular diet. The patient continued to increase activities, and pain was well controlled with oral pain medications.   Consults: None  Significant Diagnostic Studies:  Results for orders placed or performed during the hospital encounter of 12/18/21  Surgical pcr screen   Specimen: Nasal Mucosa; Nasal Swab  Result Value Ref Range   MRSA, PCR NEGATIVE NEGATIVE   Staphylococcus aureus NEGATIVE NEGATIVE  Type and screen [facility]  Result Value Ref Range   ABO/RH(D) O POS    Antibody Screen NEG    Sample Expiration      12/21/2021,2359 Performed at [facility], [place] [address], [place], [place] [address]     DG Lumbar Spine 2-3 Views  Result Date: 12/18/2021 CLINICAL DATA:  Fluoroscopic assistance for lumbar spine fusion EXAM: LUMBAR SPINE - 2-3 VIEW COMPARISON:  08/08/2021 FINDINGS: Fluoroscopic images show surgical fusion at L2-L3 and L5-S1 levels. Intervertebral disc spaces  are noted at both levels. There is removal of surgical hardware at L3-L4 level. Fluoroscopic time 1 minutes and 56 seconds. Radiation dose 123.42 mGy. IMPRESSION: Fluoroscopic assistance was provided for surgical fusion at L2-L3 and L5-S1 levels. Electronically Signed   By: Elmer Picker M.D.   On: 12/18/2021 15:06   DG C-Arm 1-60 Min-No Report  Result Date: 12/18/2021 Fluoroscopy was utilized by the requesting physician.  No radiographic interpretation.   DG C-Arm 1-60 Min-No Report  Result Date: 12/18/2021 Fluoroscopy was utilized by the requesting physician.  No radiographic interpretation.   DG C-Arm 1-60 Min-No Report  Result Date: 12/18/2021 Fluoroscopy was utilized by the requesting physician.  No radiographic interpretation.   DG OR LOCAL ABDOMEN  Result Date: 12/18/2021 CLINICAL DATA:  [id number] Surgery, elective [id number] EXAM: OR LOCAL ABDOMEN COMPARISON:  None Available. FINDINGS: Intraoperative frontal radiograph centered at L5-S1 during anterior fusion. There are 2 surgical clips, 1 just above and 1 below the fusion level. Prior L3-L4 fusion is noted. No other metallic foreign body identified. IMPRESSION: Intraoperative frontal radiograph centered at L5-S1 during anterior fusion. Two surgical clips, 1 just above and 1 below the L5-S1 fusion. No other metallic foreign body. These results were called by telephone at the time of interpretation on 12/18/2021 at 10:06 am OR 18 staff, who verbally acknowledged these results. Electronically Signed   By: Maurine Simmering M.D.   On: 12/18/2021 10:08    Antibiotics:  Anti-infectives (From admission, onward)    Start     Dose/Rate Route Frequency Ordered Stop   12/18/21 2000  ceFAZolin (ANCEF) IVPB 2g/100 mL premix  2 g 200 mL/hr over 30 Minutes Intravenous Every 8 hours 12/18/21 1655 12/19/21 0358   12/18/21 0645  ceFAZolin (ANCEF) IVPB 2g/100 mL premix        2 g 200 mL/hr over 30 Minutes Intravenous On call to O.R. 12/18/21 1191  12/18/21 1223       Discharge Exam: Blood pressure (!) 129/58, pulse 90, temperature 98.5 F (36.9 C), temperature source Oral, resp. rate 16, height '5\' 2"'$  (1.575 m), weight 80.7 kg, SpO2 100 %. Neurologic: Grossly normal Ambulating and voiding well incision cdi   Discharge Medications:   Allergies as of 12/20/2021       Reactions   Flagyl [metronidazole] Anaphylaxis   Cymbalta [duloxetine Hcl] Anxiety   Tape Rash   Paper tape okay to use per patient.        Medication List     STOP taking these medications    amoxicillin-clavulanate 875-125 MG tablet Commonly known as: AUGMENTIN   trimethoprim-polymyxin b ophthalmic solution Commonly known as: Polytrim       TAKE these medications    Aspirin Low Dose 81 MG tablet Generic drug: aspirin EC TAKE 1 TABLET BY MOUTH EVERY DAY What changed: how much to take   bisacodyl 5 MG EC tablet Commonly known as: DULCOLAX Take 5 mg by mouth daily as needed for moderate constipation.   butalbital-acetaminophen-caffeine 50-325-40 MG tablet Commonly known as: FIORICET Take 1 tablet by mouth 2 (two) times daily as needed for headache.   Cholecalciferol 250 MCG (10000 UT) Caps Take 10,000 Units by mouth once a week.   cyanocobalamin 1000 MCG/ML injection Commonly known as: VITAMIN B12 Inject 1 mL (1,000 mcg total) into the muscle every 30 (thirty) days.   diclofenac Sodium 1 % Gel Commonly known as: VOLTAREN Apply 2 g topically 4 (four) times daily as needed (pain).   estradiol 1 MG tablet Commonly known as: ESTRACE TAKE 1 TABLET EVERY DAY   etodolac 500 MG tablet Commonly known as: LODINE Take 1 tablet by mouth 2 (two) times daily.   ezetimibe 10 MG tablet Commonly known as: ZETIA TAKE 1 TABLET BY MOUTH ONCE DAILY   fluticasone 50 MCG/ACT nasal spray Commonly known as: FLONASE SPRAY 2 SPRAYS INTO EACH NOSTRIL EVERY DAY What changed: See the new instructions.   furosemide 20 MG tablet Commonly known as:  LASIX Take 1 tablet (20 mg total) by mouth daily as needed for fluid or edema.   gabapentin 600 MG tablet Commonly known as: NEURONTIN Take 1 tablet (600 mg total) by mouth in the morning, at noon, in the evening, and at bedtime.   hydrOXYzine 25 MG tablet Commonly known as: ATARAX TAKE 1/2 TO 1 TABLET BY MOUTH TWICE A DAY AS NEEDED FOR SEVERE PANIC ATTACKS/ ANXIETY What changed: See the new instructions.   omeprazole 40 MG capsule Commonly known as: PRILOSEC TAKE 1 CAPSULE BY MOUTH EVERY DAY What changed: how much to take   ondansetron 4 MG disintegrating tablet Commonly known as: Zofran ODT Take 1 tablet (4 mg total) by mouth every 8 (eight) hours as needed for nausea or vomiting.   oxyCODONE-acetaminophen 10-325 MG tablet Commonly known as: PERCOCET Take 1 tablet by mouth every 4 (four) hours as needed for pain. What changed:  when to take this reasons to take this   propranolol 10 MG tablet Commonly known as: INDERAL Take 1 tablet (10 mg total) by mouth 3 (three) times daily as needed. For anxiety attacks   rizatriptan 10 MG disintegrating  tablet Commonly known as: MAXALT-MLT Take 1 tablet (10 mg total) by mouth as needed for migraine. May repeat in 2 hours if needed, that is max dose for 24 hours.   rosuvastatin 40 MG tablet Commonly known as: CRESTOR TAKE 1 TABLET BY MOUTH EVERY DAY   tiZANidine 4 MG tablet Commonly known as: ZANAFLEX Take 1 tablet (4 mg total) by mouth 4 (four) times daily.   traZODone 100 MG tablet Commonly known as: DESYREL TAKE 1 TABLET BY MOUTH EVERYDAY AT BEDTIME   valACYclovir 1000 MG tablet Commonly known as: VALTREX TAKE 1 TABLET (1,000 MG TOTAL) BY MOUTH DAILY. FOR 5-7 DAYS, MAY REPEAT IF NEED FOR COLD SORE   venlafaxine XR 75 MG 24 hr capsule Commonly known as: EFFEXOR-XR TAKE ONE CAPSULE BY MOUTH DAILY WITH BREAKFAST What changed: See the new instructions.   Xiidra 5 % Soln Generic drug: Lifitegrast Apply 1 drop to eye 2  (two) times daily as needed. Each eye   Zoledronic Acid 4 MG/100ML IVPB Commonly known as: ZOMETA Once per year from [place]  (From admission, onward)           Start     Ordered   12/18/21 1731  DME Walker rolling  Once       Question:  Patient needs a walker to treat with the following condition  Answer:  S/P lumbar fusion   12/18/21 1730   12/18/21 1731  DME 3 n 1  Once        12/18/21 1730            Disposition: home   Final Dx: PLIF L2-3, ALIF L5-S1 and gill decompression on the right at L5-S1  Discharge Instructions     Call MD for:  difficulty breathing, headache or visual disturbances   Complete by: As directed    Call MD for:  persistant nausea and vomiting   Complete by: As directed    Call MD for:  redness, tenderness, or signs of infection (pain, swelling, redness, odor or green/yellow discharge around incision site)   Complete by: As directed    Call MD for:  severe uncontrolled pain   Complete by: As directed    Call MD for:  temperature >100.4   Complete by: As directed    Diet - low sodium heart healthy   Complete by: As directed    Increase activity slowly   Complete by: As directed    Remove dressing in 48 hours   Complete by: As directed           Signed: Ocie Cornfield Virat Prather 12/20/2021, 10:09 AM

## 2021-12-20 NOTE — Plan of Care (Signed)
Pt doing well. Pt and husband given D/C instructions with verbal understanding. Rx's were sent to the pharmacy by MD. Pt's incision is clean and dry with no sign of infection. Pt's IV was removed prior to D/C. Pt D/C'd home via wheelchair per MD order. Pt is stable @ D/C and has no other needs at this time. Shelbe Haglund, RN  

## 2021-12-21 ENCOUNTER — Telehealth: Payer: Self-pay | Admitting: *Deleted

## 2021-12-21 ENCOUNTER — Encounter (HOSPITAL_COMMUNITY): Payer: Self-pay | Admitting: Neurological Surgery

## 2021-12-21 NOTE — Patient Outreach (Signed)
  Care Coordination [facility] Note Transition Care Management Unsuccessful Follow-up Telephone Call  Date of discharge and from where:  [phone] [facility]  Attempts:  1st Attempt  Reason for unsuccessful TCM follow-up call:  Left voice message  [place] [facility] [phone]

## 2021-12-21 NOTE — Patient Outreach (Signed)
  Care Coordination [facility] Note Transition Care Management Unsuccessful Follow-up Telephone Call  Date of discharge and from where:  [facility] [phone]  Attempts:  2nd Attempt  Reason for unsuccessful TCM follow-up call:  Left voice message   [place] [facility] [phone]

## 2021-12-21 NOTE — Patient Outreach (Signed)
  Care Coordination TOC Note Transition Care Management Follow-up Telephone Call Date of discharge and from where: [phone] [facility] How have you been since you were released from the hospital? Sore and in pain , but the pain medication is helping Any questions or concerns? Yes I had to buy my own walker. They didn't give me anything. RN explained to patient to get order from Dr and send the receipt in.  Items Reviewed: Did the pt receive and understand the discharge instructions provided? Yes  Medications obtained and verified? Yes  Other? No  Any new allergies since your discharge? No  Dietary orders reviewed? Yes No special diet Do you have support at home? Yes   Home Care and Equipment/Supplies: Were home health services ordered? not applicable If so, what is the name of the agency? n  Has the agency set up a time to come to the patient's home? not applicable Were any new equipment or medical supplies ordered?  Yes: 3 in 1 and walker  ordered What is the name of the medical supply agency? None listed Were you able to get the supplies/equipment? Yes  Do you have any questions related to the use of the equipment or supplies? Y How do I get reimbursed  Functional Questionnaire: (I = Independent and D = Dependent) ADLs:D  Bathing/Dressing- D  Meal Prep- I  Eating- I  Maintaining continence- I  Transferring/Ambulation- I  Managing Meds- I  Follow up appointments reviewed:  PCP Hospital f/u appt confirmed?  n  . [facility] f/u appt confirmed? Yes  Dr Ronnald Ramp [id number] Are transportation arrangements needed? No  If their condition worsens, is the pt aware to call PCP or go to the Emergency Dept.? Yes Was the patient provided with contact information for the PCP's office or ED? Yes Was to pt encouraged to call back with questions or concerns? Yes  SDOH assessments and interventions completed:   Yes   Care Coordination Interventions:  RN contacted [employer] and  set up delivery. RN discussed with patient about her getting order for walker and send in with bill.     Encounter Outcome:  Pt. Visit Completed    [place] [facility] [phone]

## 2021-12-23 ENCOUNTER — Encounter: Payer: Self-pay | Admitting: Family Medicine

## 2021-12-26 MED FILL — Sodium Chloride IV Soln 0.9%: INTRAVENOUS | Qty: 2000 | Status: AC

## 2021-12-26 MED FILL — Heparin Sodium (Porcine) Inj 1000 Unit/ML: INTRAMUSCULAR | Qty: 30 | Status: AC

## 2021-12-27 ENCOUNTER — Ambulatory Visit (INDEPENDENT_AMBULATORY_CARE_PROVIDER_SITE_OTHER): Payer: Medicare HMO | Admitting: Family Medicine

## 2021-12-27 ENCOUNTER — Encounter: Payer: Self-pay | Admitting: Family Medicine

## 2021-12-27 VITALS — BP 134/70 | HR 71 | Temp 98.5°F | Wt 212.0 lb

## 2021-12-27 DIAGNOSIS — G8929 Other chronic pain: Secondary | ICD-10-CM | POA: Diagnosis not present

## 2021-12-27 DIAGNOSIS — D509 Iron deficiency anemia, unspecified: Secondary | ICD-10-CM

## 2021-12-27 DIAGNOSIS — M5442 Lumbago with sciatica, left side: Secondary | ICD-10-CM | POA: Diagnosis not present

## 2021-12-27 DIAGNOSIS — Z981 Arthrodesis status: Secondary | ICD-10-CM | POA: Diagnosis not present

## 2021-12-27 DIAGNOSIS — M48061 Spinal stenosis, lumbar region without neurogenic claudication: Secondary | ICD-10-CM | POA: Diagnosis not present

## 2021-12-27 DIAGNOSIS — M5441 Lumbago with sciatica, right side: Secondary | ICD-10-CM | POA: Diagnosis not present

## 2021-12-27 NOTE — Progress Notes (Unsigned)
Subjective:    Patient ID: Ashley Fields, female    DOB: 02-20-1955, 66 y.o.   MRN: [id number]  Ashley Fields is a 66 y.o. female presenting on 12/27/2021 for Hospitalization Follow-up   HPI  HOSPITAL FOLLOW-UP VISIT  Hospital/Location: [facility] Date of Admission: 12/18/21 Date of Discharge: 12/20/21 Transitions of care telephone call: Johny Shock RN completed 12/21/21  Reason for Admission: Elective Spinal Surgery, Lumbar Fusion  - Hospital H&P and Discharge Summary have been reviewed - Patient presents today 7 days after recent hospitalization. Brief summary of recent course, patient had symptoms of chronic low back pain / now had elective spinal fusion by Neurosurgery Dr Ronnald Ramp  Neurosurgery and Vascular surgery involved. - L5-S1 cage and fusion, PLIF - She had incisions front and back - Upcoming follow-up with Spine Neurosurgery January She is able to sit on shower chair and use soap and water to run over.  On Oxycodone-acetaminophen 10-'325mg'$  q 4 hr AS NEEDED  Had very small BM 2 days ago. She is taking dulcolax and Ex-lax. Now starting on Miralax.  - Today reports overall has done well after discharge. Symptoms of back pain has improved. She is wearing brace and has dressings intact.  - New medications on discharge: Oxycodone-Acetaminophen 10/325 PRN  - Changes to current meds on discharge: Holding Ozempic post op   I have reviewed the discharge medication list, and have reconciled the current and discharge medications today.   Current Outpatient Medications:    ASPIRIN LOW DOSE 81 MG tablet, TAKE 1 TABLET BY MOUTH EVERY DAY (Patient taking differently: Take 81 mg by mouth daily.), Disp: 90 tablet, Rfl: 0   bisacodyl (DULCOLAX) 5 MG EC tablet, Take 5 mg by mouth daily as needed for moderate constipation., Disp: , Rfl:    butalbital-acetaminophen-caffeine (FIORICET) 50-325-40 MG tablet, Take 1 tablet by mouth 2 (two) times daily as needed for headache., Disp: , Rfl:     Cholecalciferol 250 MCG (10000 UT) CAPS, Take 10,000 Units by mouth once a week., Disp: , Rfl:    cyanocobalamin (VITAMIN B12) 1000 MCG/ML injection, Inject 1 mL (1,000 mcg total) into the muscle every 30 (thirty) days., Disp: 1 mL, Rfl: 11   diclofenac Sodium (VOLTAREN) 1 % GEL, Apply 2 g topically 4 (four) times daily as needed (pain)., Disp: , Rfl:    estradiol (ESTRACE) 1 MG tablet, TAKE 1 TABLET EVERY DAY (Patient taking differently: Take 1 mg by mouth daily.), Disp: 90 tablet, Rfl: 1   etodolac (LODINE) 500 MG tablet, Take 1 tablet by mouth 2 (two) times daily., Disp: , Rfl:    ezetimibe (ZETIA) 10 MG tablet, TAKE 1 TABLET BY MOUTH ONCE DAILY, Disp: 90 tablet, Rfl: 1   fluticasone (FLONASE) 50 MCG/ACT nasal spray, SPRAY 2 SPRAYS INTO EACH NOSTRIL EVERY DAY (Patient taking differently: Place 2 sprays into both nostrils daily as needed.), Disp: 48 mL, Rfl: 0   furosemide (LASIX) 20 MG tablet, Take 1 tablet (20 mg total) by mouth daily as needed for fluid or edema., Disp: 90 tablet, Rfl: 1   gabapentin (NEURONTIN) 600 MG tablet, Take 1 tablet (600 mg total) by mouth in the morning, at noon, in the evening, and at bedtime., Disp: 360 tablet, Rfl: 1   hydrOXYzine (ATARAX/VISTARIL) 25 MG tablet, TAKE 1/2 TO 1 TABLET BY MOUTH TWICE A DAY AS NEEDED FOR SEVERE PANIC ATTACKS/ ANXIETY (Patient taking differently: Take 12.5-25 mg by mouth 2 (two) times daily as needed for anxiety.), Disp: 180 tablet, Rfl: 1  Lifitegrast (XIIDRA) 5 % SOLN, Apply 1 drop to eye 2 (two) times daily as needed. Each eye, Disp: , Rfl:    omeprazole (PRILOSEC) 40 MG capsule, TAKE 1 CAPSULE BY MOUTH EVERY DAY (Patient taking differently: Take 40 mg by mouth daily.), Disp: 90 capsule, Rfl: 1   ondansetron (ZOFRAN ODT) 4 MG disintegrating tablet, Take 1 tablet (4 mg total) by mouth every 8 (eight) hours as needed for nausea or vomiting., Disp: 30 tablet, Rfl: 2   oxyCODONE-acetaminophen (PERCOCET) 10-325 MG tablet, Take 1 tablet by  mouth every 4 (four) hours as needed for pain., Disp: 30 tablet, Rfl: 0   propranolol (INDERAL) 10 MG tablet, Take 1 tablet (10 mg total) by mouth 3 (three) times daily as needed. For anxiety attacks, Disp: 270 tablet, Rfl: 0   rizatriptan (MAXALT-MLT) 10 MG disintegrating tablet, Take 1 tablet (10 mg total) by mouth as needed for migraine. May repeat in 2 hours if needed, that is max dose for 24 hours., Disp: 10 tablet, Rfl: 3   rosuvastatin (CRESTOR) 40 MG tablet, TAKE 1 TABLET BY MOUTH EVERY DAY, Disp: 90 tablet, Rfl: 0   tiZANidine (ZANAFLEX) 4 MG tablet, Take 1 tablet (4 mg total) by mouth 4 (four) times daily., Disp: 360 tablet, Rfl: 1   traZODone (DESYREL) 100 MG tablet, TAKE 1 TABLET BY MOUTH EVERYDAY AT BEDTIME, Disp: 90 tablet, Rfl: 2   valACYclovir (VALTREX) 1000 MG tablet, TAKE 1 TABLET (1,000 MG TOTAL) BY MOUTH DAILY. FOR 5-7 DAYS, MAY REPEAT IF NEED FOR COLD SORE, Disp: 90 tablet, Rfl: 1   venlafaxine XR (EFFEXOR-XR) 75 MG 24 hr capsule, TAKE ONE CAPSULE BY MOUTH DAILY WITH BREAKFAST (Patient taking differently: Take 75 mg by mouth daily with breakfast.), Disp: 90 capsule, Rfl: 1   Zoledronic Acid (ZOMETA) 4 MG/100ML IVPB, Once per year from Orthopedic, Disp: 100 mL, Rfl:   ------------------------------------------------------------------------- Social History   Tobacco Use   Smoking status: Former    Packs/day: 0.25    Years: 10.00    Total pack years: 2.50    Types: Cigarettes    Quit date: 11/29/1995    Years since quitting: 26.0   Smokeless tobacco: Former   Tobacco comments:    Quite 1997, didnt smoke hardly at all when I was smoking.  Vaping Use   Vaping Use: Never used  Substance Use Topics   Alcohol use: No   Drug use: No    Review of Systems Per HPI unless specifically indicated above     Objective:    BP 134/70 (BP Location: Right Arm, Patient Position: Sitting, Cuff Size: Normal)   Pulse 71   Temp 98.5 F (36.9 C) (Oral)   Wt 212 lb (96.2 kg)   SpO2  97%   BMI 38.78 kg/m   Wt Readings from Last 3 Encounters:  12/27/21 212 lb (96.2 kg)  12/18/21 178 lb (80.7 kg)  11/15/21 199 lb (90.3 kg)    Physical Exam Vitals and nursing note reviewed.  Constitutional:      General: She is not in acute distress.    Appearance: Normal appearance. She is well-developed. She is not diaphoretic.     Comments: Well-appearing, comfortable, cooperative  HENT:     Head: Normocephalic and atraumatic.  Eyes:     General:        Right eye: No discharge.        Left eye: No discharge.     Conjunctiva/sclera: Conjunctivae normal.  Cardiovascular:  Rate and Rhythm: Normal rate.  Pulmonary:     Effort: Pulmonary effort is normal.  Musculoskeletal:     Comments: Post op back brace  Skin:    General: Skin is warm and dry.     Findings: No erythema or rash.     Comments: Surgical dressing intact  Neurological:     Mental Status: She is alert and oriented to person, place, and time.  Psychiatric:        Mood and Affect: Mood normal.        Behavior: Behavior normal.        Thought Content: Thought content normal.     Comments: Well groomed, good eye contact, normal speech and thoughts       Results for orders placed or performed in visit on 12/27/21  CBC with Differential/Platelet  Result Value Ref Range   WBC 6.4 3.8 - 10.8 Thousand/uL   RBC 3.15 (L) 3.80 - 5.10 Million/uL   Hemoglobin 9.7 (L) 11.7 - 15.5 g/dL   HCT 29.7 (L) 35.0 - 45.0 %   MCV 94.3 80.0 - 100.0 fL   MCH 30.8 27.0 - 33.0 pg   MCHC 32.7 32.0 - 36.0 g/dL   RDW 11.8 11.0 - 15.0 %   Platelets 244 140 - 400 Thousand/uL   MPV 9.6 7.5 - 12.5 fL   Neutro Abs 5,472 1,500 - 7,800 cells/uL   Lymphs Abs 627 (L) 850 - 3,900 cells/uL   Absolute Monocytes 269 200 - 950 cells/uL   Eosinophils Absolute 13 (L) 15 - 500 cells/uL   Basophils Absolute 19 0 - 200 cells/uL   Neutrophils Relative % 85.5 %   Total Lymphocyte 9.8 %   Monocytes Relative 4.2 %   Eosinophils Relative 0.2 %    Basophils Relative 0.3 %      Assessment & Plan:   Problem List Items Addressed This Visit     Chronic Low back pain (Primary Area of Pain) (Bilateral)  (R>L) (Chronic)   Relevant Orders   BASIC METABOLIC PANEL WITH GFR   S/P lumbar fusion   Other Visit Diagnoses     Spinal stenosis of lumbar region without neurogenic claudication    -  Primary   Relevant Orders   BASIC METABOLIC PANEL WITH GFR   Iron deficiency anemia, unspecified iron deficiency anemia type       Relevant Orders   Iron, TIBC and Ferritin Panel   CBC with Differential/Platelet (Completed)   BASIC METABOLIC PANEL WITH GFR      HFU Post op elective Lumbar spinal fusion surgery 7 days after discharge, limited mobility and she has done well but still recovering. Upcoming visits with neurosurgery scheduled   Future consider Zepbound for weight management instead of Ozempic. Reviewed other GLP1 therapy options  Today will check the blood counts. CBC and Anemia panel. Follow up after surgery with history of anemia.  Keep upcoming visits with Neurosurgery and Pain management.  No orders of the defined types were placed in this encounter.   Follow up plan: Return if symptoms worsen or fail to improve.   Nobie Putnam, [facility] 12/27/2021, 8:52 AM

## 2021-12-27 NOTE — Patient Instructions (Addendum)
Thank you for coming to the office today.  Future consider Zepbound for weight management instead of Ozempic.  The new version of Mounjaro for weight loss. Not diabetes.  Today will check the blood counts. CBC and Anemia panel.  Keep upcoming visits with Neurosurgery and Pain management.  Please schedule a Follow-up Appointment to: Return if symptoms worsen or fail to improve.  If you have any other questions or concerns, please feel free to call the office or send a message through [place]. You may also schedule an earlier appointment if necessary.  Additionally, you may be receiving a survey about your experience at our office within a few days to 1 week by e-mail or mail. We value your feedback.  Nobie Putnam, DO [place]

## 2021-12-28 ENCOUNTER — Encounter: Payer: Self-pay | Admitting: Family Medicine

## 2021-12-28 LAB — CBC WITH DIFFERENTIAL/PLATELET
Absolute Monocytes: 269 cells/uL (ref 200–950)
Basophils Absolute: 19 cells/uL (ref 0–200)
Basophils Relative: 0.3 %
Eosinophils Absolute: 13 cells/uL — ABNORMAL LOW (ref 15–500)
Eosinophils Relative: 0.2 %
HCT: 29.7 % — ABNORMAL LOW (ref 35.0–45.0)
Hemoglobin: 9.7 g/dL — ABNORMAL LOW (ref 11.7–15.5)
Lymphs Abs: 627 cells/uL — ABNORMAL LOW (ref 850–3900)
MCH: 30.8 pg (ref 27.0–33.0)
MCHC: 32.7 g/dL (ref 32.0–36.0)
MCV: 94.3 fL (ref 80.0–100.0)
MPV: 9.6 fL (ref 7.5–12.5)
Monocytes Relative: 4.2 %
Neutro Abs: 5472 cells/uL (ref 1500–7800)
Neutrophils Relative %: 85.5 %
Platelets: 244 10*3/uL (ref 140–400)
RBC: 3.15 10*6/uL — ABNORMAL LOW (ref 3.80–5.10)
RDW: 11.8 % (ref 11.0–15.0)
Total Lymphocyte: 9.8 %
WBC: 6.4 10*3/uL (ref 3.8–10.8)

## 2021-12-28 LAB — BASIC METABOLIC PANEL WITH GFR
BUN: 13 mg/dL (ref 7–25)
CO2: 28 mmol/L (ref 20–32)
Calcium: 8.7 mg/dL (ref 8.6–10.4)
Chloride: 107 mmol/L (ref 98–110)
Creat: 0.59 mg/dL (ref 0.50–1.05)
Glucose, Bld: 114 mg/dL — ABNORMAL HIGH (ref 65–99)
Potassium: 4.6 mmol/L (ref 3.5–5.3)
Sodium: 142 mmol/L (ref 135–146)
eGFR: 100 mL/min/{1.73_m2} (ref >=60)

## 2021-12-28 LAB — IRON,TIBC AND FERRITIN PANEL
%SAT: 15 % (calc) — ABNORMAL LOW (ref 16–45)
Ferritin: 80 ng/mL (ref 16–288)
Iron: 48 ug/dL (ref 45–160)
TIBC: 331 mcg/dL (calc) (ref 250–450)

## 2021-12-30 ENCOUNTER — Encounter: Payer: Self-pay | Admitting: Family Medicine

## 2022-01-05 ENCOUNTER — Other Ambulatory Visit: Payer: Self-pay | Admitting: Psychiatry

## 2022-01-05 ENCOUNTER — Other Ambulatory Visit: Payer: Self-pay | Admitting: Family Medicine

## 2022-01-05 DIAGNOSIS — E782 Mixed hyperlipidemia: Secondary | ICD-10-CM

## 2022-01-05 DIAGNOSIS — J3089 Other allergic rhinitis: Secondary | ICD-10-CM

## 2022-01-05 DIAGNOSIS — F4310 Post-traumatic stress disorder, unspecified: Secondary | ICD-10-CM

## 2022-01-05 DIAGNOSIS — H00021 Hordeolum internum right upper eyelid: Secondary | ICD-10-CM

## 2022-01-05 DIAGNOSIS — H34211 Partial retinal artery occlusion, right eye: Secondary | ICD-10-CM

## 2022-01-05 NOTE — Telephone Encounter (Signed)
Unable to refill per protocol, Rx request is too soon. Will refuse duplicate request.  Requested Prescriptions  Pending Prescriptions Disp Refills   fluticasone (FLONASE) 50 MCG/ACT nasal spray [Pharmacy Med Name: FLUTICASONE PROP 50 MCG SPRAY] 48 mL 0    Sig: SPRAY 2 SPRAYS INTO EACH NOSTRIL EVERY DAY     Ear, Nose, and Throat: Nasal Preparations - Corticosteroids Passed - 01/05/2022 12:39 PM      Passed - Valid encounter within last 12 months    Recent Outpatient Visits           1 week ago Spinal stenosis of lumbar region without neurogenic claudication   [place], DO   1 month ago Hordeolum internum of right upper eyelid   [place], DO   3 months ago Hearing loss, unspecified hearing loss type, unspecified laterality   [place], DO   5 months ago Spinal stenosis of lumbar region without neurogenic claudication   [place], DO   8 months ago Annual physical exam   [place], Devonne Doughty, DO

## 2022-01-05 NOTE — Telephone Encounter (Signed)
Unable to refill per protocol, Rx request is too soon, last refill 10/23/21 for 48 mL. Will refuse.  Requested Prescriptions  Pending Prescriptions Disp Refills   fluticasone (FLONASE) 50 MCG/ACT nasal spray [Pharmacy Med Name: FLUTICASONE PROP 50 MCG SPRAY] 48 mL 0    Sig: SPRAY 2 SPRAYS INTO EACH NOSTRIL EVERY DAY     Ear, Nose, and Throat: Nasal Preparations - Corticosteroids Passed - 01/05/2022 11:25 AM      Passed - Valid encounter within last 12 months    Recent Outpatient Visits           1 week ago Spinal stenosis of lumbar region without neurogenic claudication   [place], DO   1 month ago Hordeolum internum of right upper eyelid   [place], DO   3 months ago Hearing loss, unspecified hearing loss type, unspecified laterality   [place], DO   5 months ago Spinal stenosis of lumbar region without neurogenic claudication   [place], DO   8 months ago Annual physical exam   [place], Devonne Doughty, DO

## 2022-01-06 ENCOUNTER — Encounter: Payer: Self-pay | Admitting: Family Medicine

## 2022-01-09 DIAGNOSIS — R200 Anesthesia of skin: Secondary | ICD-10-CM | POA: Diagnosis not present

## 2022-01-09 DIAGNOSIS — M5416 Radiculopathy, lumbar region: Secondary | ICD-10-CM | POA: Diagnosis not present

## 2022-01-09 DIAGNOSIS — R202 Paresthesia of skin: Secondary | ICD-10-CM | POA: Diagnosis not present

## 2022-01-09 DIAGNOSIS — M545 Low back pain, unspecified: Secondary | ICD-10-CM | POA: Diagnosis not present

## 2022-01-11 ENCOUNTER — Other Ambulatory Visit: Payer: Self-pay | Admitting: Family Medicine

## 2022-01-11 ENCOUNTER — Telehealth: Payer: Self-pay | Admitting: Internal Medicine

## 2022-01-11 DIAGNOSIS — K219 Gastro-esophageal reflux disease without esophagitis: Secondary | ICD-10-CM

## 2022-01-11 NOTE — Telephone Encounter (Signed)
Patient states she had surgery and her hemoglobin has dropped. Her surgeon reccommended she get an iron infusion. Her labs were done by Dr. Robbie Lis.  Please advise on scheduling-

## 2022-01-11 NOTE — Telephone Encounter (Signed)
Dr. Jacinto Reap, please review lab results on 12/27/21 and advise.

## 2022-01-11 NOTE — Telephone Encounter (Signed)
Requested Prescriptions  Pending Prescriptions Disp Refills   omeprazole (PRILOSEC) 40 MG capsule [Pharmacy Med Name: OMEPRAZOLE DR 40 MG CAPSULE] 90 capsule 0    Sig: Take 1 capsule (40 mg total) by mouth daily.     Gastroenterology: Proton Pump Inhibitors Passed - 01/11/2022  9:31 AM      Passed - Valid encounter within last 12 months    Recent Outpatient Visits           2 weeks ago Spinal stenosis of lumbar region without neurogenic claudication   [place], DO   2 months ago Hordeolum internum of right upper eyelid   [place], DO   4 months ago Hearing loss, unspecified hearing loss type, unspecified laterality   [place], DO   6 months ago Spinal stenosis of lumbar region without neurogenic claudication   [place], DO   8 months ago Annual physical exam   [place], Devonne Doughty, DO

## 2022-01-13 ENCOUNTER — Other Ambulatory Visit: Payer: Self-pay | Admitting: Family Medicine

## 2022-01-13 DIAGNOSIS — E782 Mixed hyperlipidemia: Secondary | ICD-10-CM

## 2022-01-15 ENCOUNTER — Encounter: Payer: Self-pay | Admitting: Psychiatry

## 2022-01-15 ENCOUNTER — Telehealth (INDEPENDENT_AMBULATORY_CARE_PROVIDER_SITE_OTHER): Payer: Medicare HMO | Admitting: Psychiatry

## 2022-01-15 DIAGNOSIS — F3178 Bipolar disorder, in full remission, most recent episode mixed: Secondary | ICD-10-CM

## 2022-01-15 DIAGNOSIS — F410 Panic disorder [episodic paroxysmal anxiety] without agoraphobia: Secondary | ICD-10-CM

## 2022-01-15 DIAGNOSIS — F4310 Post-traumatic stress disorder, unspecified: Secondary | ICD-10-CM | POA: Diagnosis not present

## 2022-01-15 DIAGNOSIS — F4323 Adjustment disorder with mixed anxiety and depressed mood: Secondary | ICD-10-CM

## 2022-01-15 DIAGNOSIS — G4701 Insomnia due to medical condition: Secondary | ICD-10-CM

## 2022-01-15 MED ORDER — TRAZODONE HCL 100 MG PO TABS: 100.0000 mg | ORAL_TABLET | Freq: Every evening | ORAL | 0 refills | Status: DC | PRN

## 2022-01-15 NOTE — Telephone Encounter (Signed)
Refilled 11/08/2021 #90 1 rf. Requested Prescriptions  Pending Prescriptions Disp Refills   ezetimibe (ZETIA) 10 MG tablet [Pharmacy Med Name: EZETIMIBE 10 MG Tablet] 90 tablet 3    Sig: TAKE 1 TABLET (10 MG TOTAL) BY MOUTH DAILY.     Cardiovascular:  Antilipid - Sterol Transport Inhibitors Failed - 01/13/2022  5:31 AM      Failed - AST in normal range and within 360 days    AST  Date Value Ref Range Status  11/15/2021 65 (H) 15 - 41 U/L Final         Failed - ALT in normal range and within 360 days    ALT  Date Value Ref Range Status  11/15/2021 58 (H) 0 - 44 U/L Final         Failed - Lipid Panel in normal range within the last 12 months    Cholesterol  Date Value Ref Range Status  04/28/2021 132 <200 mg/dL Final   LDL Cholesterol (Calc)  Date Value Ref Range Status  04/28/2021 40 mg/dL (calc) Final    Comment:    Reference range: <100 . Desirable range <100 mg/dL for primary prevention;   <70 mg/dL for patients with CHD or diabetic patients  with > or = 2 CHD risk factors. Marland Kitchen LDL-C is now calculated using the Martin-Hopkins  calculation, which is a validated novel method providing  better accuracy than the Friedewald equation in the  estimation of LDL-C.  Cresenciano Genre et al. Annamaria Helling. 4235;361(44): 2061-2068  (http://education.[employer].com/faq/FAQ164)    HDL  Date Value Ref Range Status  04/28/2021 76 > OR = 50 mg/dL Final   Triglycerides  Date Value Ref Range Status  04/28/2021 79 <150 mg/dL Final         Passed - Patient is not pregnant      Passed - Valid encounter within last 12 months    Recent Outpatient Visits           2 weeks ago Spinal stenosis of lumbar region without neurogenic claudication   [place], DO   2 months ago Hordeolum internum of right upper eyelid   [place], DO   4 months ago Hearing loss, unspecified hearing loss type, unspecified laterality    [place], DO   6 months ago Spinal stenosis of lumbar region without neurogenic claudication   [place], DO   8 months ago Annual physical exam   [place], Devonne Doughty, DO

## 2022-01-15 NOTE — Progress Notes (Signed)
Virtual Visit via Video Note  I connected with Ashley Fields on 01/15/22 at 11:30 AM EST by a video enabled telemedicine application and verified that I am speaking with the correct person using two identifiers.  Location Provider Location : [facility] Patient Location : Home  Participants: Patient , Provider    I discussed the limitations of evaluation and management by telemedicine and the availability of in person appointments. The patient expressed understanding and agreed to proceed.    I discussed the assessment and treatment plan with the patient. The patient was provided an opportunity to ask questions and all were answered. The patient agreed with the plan and demonstrated an understanding of the instructions.   The patient was advised to call back or seek an in-person evaluation if the symptoms worsen or if the condition fails to improve as anticipated.   [place] MD OP Progress Note  01/15/2022 12:41 PM Ashley Fields  MRN:  [id number]  Chief Complaint:  Chief Complaint  Patient presents with   Follow-up   Medication Refill   Anxiety   Depression   HPI: Ashley Fields is a 67 year old Caucasian female, married on disability, lives in [facility], has a history of bipolar disorder, PTSD, chronic pain, migraine headaches, history of laparoscopic gastric bypass was evaluated by telemedicine today.  Patient today reports she is currently in a lot of pain.  Status post back surgery, PLIF L2-3, ALIF L5-S1.  She also has lower spinal fracture.  She has been advised to have bed rest for the next 5 weeks or more.  She has upcoming appointment with her surgeon on the 16th.  Currently on Percocet for pain management.  Patient reports she is unable to get up and walk , currently uses a walker.  She reports she is unable to help her spouse take care of the home, the farm.  That has been extremely stressful since her spouse is also frustrated due to doing everything alone.  That worries her.  Patient  reports she currently feels anxious, sad and has sleep problems.  She does stay in bed all day and reports taking naps at least once a day.  At night sleep is interrupted a lot she does not know why.  Unknown if this is due to pain or due to lack of sleep hygiene.  Does have trazodone available.  Agreeable to increasing dosage.  Patient compliant on her medications like venlafaxine.  She is also on gabapentin which is a mood stabilizer which she takes for pain.  Denies side effects to any of her medications as noted above.  Patient appeared to be alert, oriented to person place time and situation.  Currently not in psychotherapy, reports she is unable to afford it at this time.  However motivated to look into it and will Biochemist, clinical know.  Denies any suicidality, homicidality or perceptual disturbances.  Patient denies any other concerns today. Visit Diagnosis:    ICD-10-CM   1. Bipolar disorder, in full remission, most recent episode mixed ([place])  F31.78    mild    2. PTSD (post-traumatic stress disorder)  F43.10     3. Panic disorder  F41.0     4. Insomnia due to medical condition  G47.01 traZODone (DESYREL) 100 MG tablet   pain    5. Adjustment disorder with mixed anxiety and depressed mood  F43.23       Past Psychiatric History: Reviewed past psychiatric history from progress note on 05/15/2018.  Past trials of Prozac, Wellbutrin,  Lexapro, Cymbalta, Rexulti, Ambien-nightmares, Lunesta, Lamictal, risperidone.  Past Medical History:  Past Medical History:  Diagnosis Date   Allergy    Anemia    Anxiety    Arthritis    Yes   Back pain    Blood transfusion without reported diagnosis 1977   Transfusions from miscarriage & hemorrhaging   Chronic kidney disease    Depression    Disp fx of cuboid bone of right foot, init for clos fx 01/01/2017   Fibromyalgia    GERD (gastroesophageal reflux disease)    Headache    Hepatitis C    Hyperlipidemia    Lumbar radiculopathy    Neck  pain    Neuromuscular disorder ([place]) 1997   Falling to much was an eye-opener   Osteoporosis    Thyroid disease    Urine incontinence     Past Surgical History:  Procedure Laterality Date   5 miscarriages     1977-1985, Blood transfusion s/p miscarriage 1977   ABDOMINAL EXPOSURE N/A 12/18/2021   Procedure: ABDOMINAL EXPOSURE;  Surgeon: Marty Heck, MD;  Location: [facility];  Service: Vascular;  Laterality: N/A;   ABDOMINAL HYSTERECTOMY  1987   Total   ANTERIOR LUMBAR FUSION N/A 12/18/2021   Procedure: Anterior Lumbar Interbody Fusion  - Lumbar Five-Sacral One;  Surgeon: Eustace Moore, MD;  Location: [facility];  Service: Neurosurgery;  Laterality: N/A;   APPENDECTOMY  12/2008   AUGMENTATION MAMMAPLASTY Bilateral 2015   Bilat   AUGMENTATION MAMMAPLASTY Bilateral 2018   implants redone w/ placement of implants under muscle   breast lift bilateral, implants  73/42/8768   bilateral, silicon naturel   BREAST REDUCTION SURGERY Bilateral 1997   CESAREAN SECTION  07/27/1981   Placenta Previa   CHOLECYSTECTOMY  03/2004   Lap surgery   COLONOSCOPY WITH PROPOFOL N/A 06/13/2020   Procedure: COLONOSCOPY WITH PROPOFOL;  Surgeon: Virgel Manifold, MD;  Location: [facility] ENDOSCOPY;  Service: Endoscopy;  Laterality: N/A;   COSMETIC SURGERY     Breast implants w/ lift, tummy tuck, upper & lower Blepharop   ESOPHAGOGASTRODUODENOSCOPY (EGD) WITH PROPOFOL N/A 06/13/2020   Procedure: ESOPHAGOGASTRODUODENOSCOPY (EGD) WITH PROPOFOL;  Surgeon: Virgel Manifold, MD;  Location: [facility] ENDOSCOPY;  Service: Endoscopy;  Laterality: N/A;   EYE SURGERY  2017   Cataract surgery & lasik   GASTRIC BYPASS  2005   Laparoscopic   IVC FILTER INSERTION  12/2003   TrapEase Vena Cava Filter   LUMBAR LAMINECTOMY  06/2006   L4-L5 (spinal fusion)   mini tummy tuck  05/07/2016   Bilateral bra/back roll lift skin removal   neck fusion     c3-c7 cadavier bones and metal plates placed per patient   neck surgery  C3-C7  02/10/2015   ACDF   REDUCTION MAMMAPLASTY Bilateral 1997   SHOULDER ACROMIOPLASTY Left 03/27/2013   w/ labral debridement   SMALL INTESTINE SURGERY  12/24/2003   Lap Gastric Bypass   SPINAL CORD STIMULATOR IMPLANT  11/2010   removed 05/2011   SPINE SURGERY  2008 2017   Fusion L4-L5, ACDF C4-C7    Family Psychiatric History: Reviewed family psychiatric history from progress note on 05/15/2018.  Family History:  Family History  Problem Relation Age of Onset   COPD Mother    Lung cancer Mother    Diabetes Mother    Heart disease Mother    Stroke Mother    Heart attack Mother    Arthritis Mother    Cancer Mother  Hypertension Mother    Obesity Mother    Varicose Veins Mother    Heart disease Father    Heart attack Father 72   Early death Father    Depression Sister        x 85 sisters   COPD Sister    Hypertension Sister    Heart disease Brother        x 3 brothers   Diabetes Brother    Colon polyps Sister    Hearing loss Sister    Heart attack Sister    Heart attack Brother    Arthritis Sister    Varicose Veins Sister    Arthritis Brother    Heart disease Brother    Hypertension Brother    Cancer Maternal Grandfather    Stroke Maternal Grandfather    COPD Sister    Obesity Sister    Depression Sister    Depression Sister    Heart disease Sister    Hypertension Sister    Diabetes Brother    Heart disease Brother    Hypertension Brother    Obesity Brother    Varicose Veins Brother    Heart disease Brother    Hypertension Brother    Obesity Brother    Hypertension Sister    Obesity Sister    Miscarriages / Stillbirths Maternal Aunt    Miscarriages / Stillbirths Paternal Aunt    Breast cancer Neg Hx     Social History: Reviewed social history from progress note on 05/15/2018. Social History   Socioeconomic History   Marital status: Married    Spouse name: Not on file   Number of children: 1   Years of education: High School   Highest  education level: Some college, no degree  Occupational History   Occupation: disability  Tobacco Use   Smoking status: Former    Packs/day: 0.25    Years: 10.00    Total pack years: 2.50    Types: Cigarettes    Quit date: 11/29/1995    Years since quitting: 26.1   Smokeless tobacco: Former   Tobacco comments:    Quite 1997, didnt smoke hardly at all when I was smoking.  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]   Social Determinants of Health   Financial Resource Strain: [place]  (04/06/2021)   Overall Financial Resource Strain (CARDIA)    Difficulty of Paying Living Expenses: Not very hard  Food Insecurity: No Food Insecurity (12/21/2021)   Hunger Vital Sign    Worried About Running Out of Food in the Last Year: Never true    Ran Out of Food in the Last Year: Never true  Transportation Needs: No Transportation Needs (12/21/2021)   PRAPARE - Hydrologist (Medical): No    Lack of Transportation (Non-Medical): No  Physical Activity: Insufficiently Active (04/06/2021)   Exercise Vital Sign    Days of Exercise per Week: 2 days    Minutes of Exercise per Session: 10 min  Stress: Stress Concern Present (04/06/2021)   [place]  Feeling of Stress : Very much  Social Connections: Moderately Isolated (04/06/2021)   Social Connection and Isolation Panel [NHANES]    Frequency of Communication with Friends and Family: More than three times a week    Frequency of Social Gatherings with Friends and Family: More than three times a week    Attends  Religious Services: Never    Marine scientist or Organizations: No    Attends Archivist Meetings: Never    Marital Status: Married    Allergies:  Allergies  Allergen Reactions   Flagyl [Metronidazole] Anaphylaxis   Cymbalta [Duloxetine Hcl] Anxiety   Tape Rash    Paper tape okay to use per patient.    Metabolic Disorder Labs: Lab Results  Component Value Date   HGBA1C 4.8 04/28/2021   MPG 91 04/28/2021   MPG 97 03/30/2020   No results found for: "PROLACTIN" Lab Results  Component Value Date   CHOL 132 04/28/2021   TRIG 79 04/28/2021   HDL 76 04/28/2021   CHOLHDL 1.7 04/28/2021   LDLCALC 40 04/28/2021   LDLCALC 49 03/30/2020   Lab Results  Component Value Date   TSH 3.29 04/28/2021   TSH 1.97 03/30/2020    Therapeutic Level Labs: No results found for: "LITHIUM" No results found for: "VALPROATE" No results found for: "CBMZ"  Current Medications: Current Outpatient Medications  Medication Sig Dispense Refill   aspirin EC (ASPIRIN LOW DOSE) 81 MG tablet TAKE 1 TABLET BY MOUTH EVERY DAY 90 tablet 0   bisacodyl (DULCOLAX) 5 MG EC tablet Take 5 mg by mouth daily as needed for moderate constipation.     butalbital-acetaminophen-caffeine (FIORICET) 50-325-40 MG tablet Take 1 tablet by mouth 2 (two) times daily as needed for headache.     Cholecalciferol 250 MCG (10000 UT) CAPS Take 10,000 Units by mouth once a week.     cyanocobalamin (VITAMIN B12) 1000 MCG/ML injection Inject 1 mL (1,000 mcg total) into the muscle every 30 (thirty) days. 1 mL 11   diclofenac Sodium (VOLTAREN) 1 % GEL Apply 2 g topically 4 (four) times daily as needed (pain).     estradiol (ESTRACE) 1 MG tablet TAKE 1 TABLET EVERY DAY (Patient taking differently: Take 1 mg by mouth daily.) 90 tablet 1   etodolac (LODINE) 500 MG tablet Take 1 tablet by mouth 2 (two) times daily.     ezetimibe (ZETIA) 10 MG tablet TAKE 1 TABLET BY MOUTH ONCE DAILY 90 tablet 1   fluticasone (FLONASE) 50  MCG/ACT nasal spray SPRAY 2 SPRAYS INTO EACH NOSTRIL EVERY DAY 48 mL 0   furosemide (LASIX) 20 MG tablet Take 1 tablet (20 mg total) by mouth daily as needed for fluid or edema. 90 tablet 1   gabapentin (NEURONTIN) 600 MG tablet Take 1 tablet (600 mg total) by mouth in the morning, at noon, in the evening, and at bedtime. 360 tablet 1   hydrOXYzine (ATARAX/VISTARIL) 25 MG tablet TAKE 1/2 TO 1 TABLET BY MOUTH TWICE A DAY AS NEEDED FOR SEVERE PANIC ATTACKS/ ANXIETY (Patient taking differently: Take 12.5-25 mg by mouth 2 (two) times daily as needed for anxiety.) 180 tablet 1   Lifitegrast (XIIDRA) 5 % SOLN Apply 1 drop to eye 2 (two) times daily as needed. Each eye     omeprazole (PRILOSEC) 40 MG capsule Take 1 capsule (40 mg total) by mouth daily. 90 capsule 0   ondansetron (ZOFRAN ODT) 4 MG disintegrating tablet Take 1 tablet (4 mg total) by mouth  every 8 (eight) hours as needed for nausea or vomiting. 30 tablet 2   oxyCODONE-acetaminophen (PERCOCET) 10-325 MG tablet Take 1 tablet by mouth every 4 (four) hours as needed for pain. 30 tablet 0   propranolol (INDERAL) 10 MG tablet TAKE 1 TABLET (10 MG TOTAL) BY MOUTH 3 (THREE) TIMES DAILY AS NEEDED. FOR ANXIETY ATTACKS 270 tablet 0   rizatriptan (MAXALT-MLT) 10 MG disintegrating tablet Take 1 tablet (10 mg total) by mouth as needed for migraine. May repeat in 2 hours if needed, that is max dose for 24 hours. 10 tablet 3   rosuvastatin (CRESTOR) 40 MG tablet TAKE 1 TABLET BY MOUTH EVERY DAY 90 tablet 0   tiZANidine (ZANAFLEX) 4 MG tablet Take 1 tablet (4 mg total) by mouth 4 (four) times daily. 360 tablet 1   valACYclovir (VALTREX) 1000 MG tablet TAKE 1 TABLET (1,000 MG TOTAL) BY MOUTH DAILY. FOR 5-7 DAYS, MAY REPEAT IF NEED FOR COLD SORE 90 tablet 1   venlafaxine XR (EFFEXOR-XR) 75 MG 24 hr capsule TAKE ONE CAPSULE BY MOUTH DAILY WITH BREAKFAST (Patient taking differently: Take 75 mg by mouth daily with breakfast.) 90 capsule 1   Zoledronic Acid (ZOMETA) 4  MG/100ML IVPB Once per year from Orthopedic 100 mL    traZODone (DESYREL) 100 MG tablet Take 1-1.5 tablets (100-150 mg total) by mouth at bedtime as needed for sleep. 135 tablet 0   No current facility-administered medications for this visit.     Musculoskeletal UTA Strength & Muscle Tone:  UTA Gait & Station:  Seated Patient leans: N/A  Psychiatric Specialty Exam: Review of Systems  Musculoskeletal:  Positive for back pain (lower pain, S/P Surgery , lower back fracture).  Psychiatric/Behavioral:  Positive for dysphoric mood and sleep disturbance. The patient is nervous/anxious.   All other systems reviewed and are negative.   There were no vitals taken for this visit.There is no height or weight on file to calculate BMI.  General Appearance: Casual  Eye Contact:  Fair  Speech:  Clear and Coherent  Volume:  Normal  Mood:  Anxious, sad  Affect:  Congruent  Thought Process:  Goal Directed and Descriptions of Associations: Intact  Orientation:  Full (Time, Place, and Person)  Thought Content: Logical   Suicidal Thoughts:  No  Homicidal Thoughts:  No  Memory:  Immediate;   Fair Recent;   Fair Remote;   Fair  Judgement:  Fair  Insight:  Fair  Psychomotor Activity:  Normal  Concentration:  Concentration: Fair and Attention Span: Fair  Recall:  [employer] of Knowledge: Fair  Language: Fair  Akathisia:  No  Handed:  Right  AIMS (if indicated): not done  Assets:  Communication Skills Desire for Improvement Housing Intimacy Social Support Transportation  ADL's:  Intact  Cognition: WNL  Sleep:  Poor   Screenings: AIMS    Flowsheet Row Video Visit from 06/13/2021 in [facility] Video Visit from 05/16/2021 in [facility] Total Score 0 0      GAD-7    Flowsheet Row Video Visit from 01/15/2022 in [facility] Office Visit from 12/27/2021 in [facility] Office Visit  from 11/09/2021 in [facility] Video Visit from 10/24/2021 in [place] [facility] Office Visit from 09/12/2021 in [facility]  Total GAD-7 Score 9 0 0 5 1      PHQ2-9    Flowsheet Row Video Visit from 01/15/2022 in [facility] Office Visit from 12/27/2021 in [facility] Office Visit from 11/09/2021 in [facility] Video Visit from 10/24/2021 in [place] Office Visit from 09/12/2021 in [facility]  PHQ-2 Total Score '2 1 3 '$ 0 1  PHQ-9 Total Score '6 2 3 4 3      '$ Flowsheet Row Video Visit from 01/15/2022 in [place] Admission (Discharged) from 12/18/2021 in [facility] [facility] from 11/15/2021 in [facility] PREADMISSION TESTING  C-SSRS RISK CATEGORY No Risk No Risk No Risk        Assessment and Plan: Kambry Takacs is a 67 year old Caucasian female who has a history of PTSD, bipolar disorder, panic disorder, migraine headaches, chronic pain was evaluated by telemedicine today.  Patient with recent surgery, spinal fracture, in pain, which does have an impact on her mood, sleep ,will benefit from psychotherapy Fields, medication management, discussed plan as noted below.  Plan Bipolar disorder type I mixed in remission Gabapentin 600 mg 4 times a day which she is taking for pain.   PTSD-stable Venlafaxine 75 mg p.o. daily  Panic disorder-stable Venlafaxine 75 mg p.o. daily Propranolol 10 mg p.o. 3 times daily as needed for anxiety Hydroxyzine 25 mg daily as needed.  Insomnia-unstable Increase trazodone to 100-150 mg p.o. nightly as needed Patient will need sufficient pain management  Adjustment disorder with depression and anxiety-unstable Patient will benefit from referral for CBT, patient to let writer know if she is  interested. Discussed readjusting the dosage of venlafaxine or adding a mood stabilizer, patient would like to wait until her appointment with surgery on June 16.  She will reach out to the clinic.  Follow-up in clinic in 4 weeks or sooner if needed.    Collaboration of Care: Collaboration of Care: Referral or follow-up with counselor/therapist AEB encouraged to establish care with therapist, patient will let writer know if interested.  Patient/Guardian was advised Release of Information must be obtained prior to any record release in order to collaborate their care with an outside provider. Patient/Guardian was advised if they have not already done so to contact the registration department to sign all necessary forms in order for [place] to release information regarding their care.   Consent: Patient/Guardian gives verbal consent for treatment and assignment of benefits for services provided during this visit. Patient/Guardian expressed understanding and agreed to proceed.   This note was generated in part or whole with voice recognition software. Voice recognition is usually quite accurate but there are transcription errors that can and very often do occur. I apologize for any typographical errors that were not detected and corrected.      Ursula Alert, MD 01/15/2022, 12:41 PM

## 2022-01-16 ENCOUNTER — Inpatient Hospital Stay: Payer: Medicare HMO

## 2022-01-17 ENCOUNTER — Inpatient Hospital Stay: Payer: Medicare HMO | Attending: Internal Medicine

## 2022-01-17 VITALS — BP 115/58 | HR 74 | Temp 96.1°F | Resp 16

## 2022-01-17 DIAGNOSIS — E538 Deficiency of other specified B group vitamins: Secondary | ICD-10-CM | POA: Diagnosis not present

## 2022-01-17 DIAGNOSIS — Z79899 Other long term (current) drug therapy: Secondary | ICD-10-CM | POA: Diagnosis not present

## 2022-01-17 DIAGNOSIS — D508 Other iron deficiency anemias: Secondary | ICD-10-CM | POA: Diagnosis not present

## 2022-01-17 MED ORDER — SODIUM CHLORIDE 0.9 % IV SOLN
200.0000 mg | Freq: Once | INTRAVENOUS | Status: AC
  Administered 2022-01-17: 200 mg via INTRAVENOUS
  Filled 2022-01-17: qty 200

## 2022-01-17 MED ORDER — IRON SUCROSE 20 MG/ML IV SOLN: 200.0000 mg | Freq: Once | INTRAVENOUS | Status: DC

## 2022-01-17 MED ORDER — SODIUM CHLORIDE 0.9 % IV SOLN
Freq: Once | INTRAVENOUS | Status: AC
  Filled 2022-01-17: qty 250

## 2022-01-17 NOTE — Telephone Encounter (Signed)
Copied from [place] [id number]. Topic: General - Inquiry >> Jan 04, 2022  1:09 PM Marcellus Scott wrote: Reason for CRM: Susie from [place] advice line stated pt is requesting a new walker and elevated commode seat with arms.  Requesting an order be sent to a DME company.  Please advise.

## 2022-01-17 NOTE — Telephone Encounter (Signed)
Copied from [place] [id number]. Topic: General - Inquiry >> Jan 04, 2022  1:09 PM Marcellus Scott wrote: Reason for CRM: Susie from [place] advice line stated pt is requesting a new walker and elevated commode seat with arms.  Requesting an order be sent to a DME company.  Please advise.

## 2022-01-23 DIAGNOSIS — M5416 Radiculopathy, lumbar region: Secondary | ICD-10-CM | POA: Diagnosis not present

## 2022-01-23 DIAGNOSIS — S3210XD Unspecified fracture of sacrum, subsequent encounter for fracture with routine healing: Secondary | ICD-10-CM | POA: Diagnosis not present

## 2022-01-23 DIAGNOSIS — Z6835 Body mass index (BMI) 35.0-35.9, adult: Secondary | ICD-10-CM | POA: Diagnosis not present

## 2022-01-25 ENCOUNTER — Encounter: Payer: Self-pay | Admitting: Family Medicine

## 2022-01-25 DIAGNOSIS — M47896 Other spondylosis, lumbar region: Secondary | ICD-10-CM | POA: Diagnosis not present

## 2022-01-25 DIAGNOSIS — M25552 Pain in left hip: Secondary | ICD-10-CM | POA: Diagnosis not present

## 2022-01-25 DIAGNOSIS — M5416 Radiculopathy, lumbar region: Secondary | ICD-10-CM | POA: Diagnosis not present

## 2022-01-25 DIAGNOSIS — M545 Low back pain, unspecified: Secondary | ICD-10-CM | POA: Diagnosis not present

## 2022-01-25 DIAGNOSIS — G894 Chronic pain syndrome: Secondary | ICD-10-CM | POA: Diagnosis not present

## 2022-01-25 DIAGNOSIS — Z79899 Other long term (current) drug therapy: Secondary | ICD-10-CM | POA: Diagnosis not present

## 2022-01-25 DIAGNOSIS — M48061 Spinal stenosis, lumbar region without neurogenic claudication: Secondary | ICD-10-CM | POA: Diagnosis not present

## 2022-01-25 DIAGNOSIS — M479 Spondylosis, unspecified: Secondary | ICD-10-CM | POA: Diagnosis not present

## 2022-01-25 DIAGNOSIS — M533 Sacrococcygeal disorders, not elsewhere classified: Secondary | ICD-10-CM | POA: Diagnosis not present

## 2022-01-25 NOTE — Progress Notes (Signed)
Received fax 01/24/22 from [employer], for request for ActaStim-S device, DME, it has been ordered by Dr Sherley Bounds [facility]) and order form needs PCP signature for ins approval.  ICD10 Z98.1, Arthodesis status, CPT [id number], 1 unit, osteogenesis stimulator  Signed and ready to be faxed w/ office visit record from Dr Ronnald Ramp  Fax to [facility] Fax [phone]  Nobie Putnam, [facility] 01/25/2022, 1:04 PM

## 2022-02-02 DIAGNOSIS — Z981 Arthrodesis status: Secondary | ICD-10-CM | POA: Diagnosis not present

## 2022-02-05 ENCOUNTER — Ambulatory Visit: Payer: Medicare HMO | Attending: Medical | Admitting: Medical

## 2022-02-05 ENCOUNTER — Encounter: Payer: Self-pay | Admitting: Medical

## 2022-02-05 VITALS — BP 118/64 | HR 70 | Ht 62.0 in | Wt 204.5 lb

## 2022-02-05 DIAGNOSIS — R011 Cardiac murmur, unspecified: Secondary | ICD-10-CM

## 2022-02-05 DIAGNOSIS — I25118 Atherosclerotic heart disease of native coronary artery with other forms of angina pectoris: Secondary | ICD-10-CM

## 2022-02-05 DIAGNOSIS — R002 Palpitations: Secondary | ICD-10-CM

## 2022-02-05 DIAGNOSIS — E782 Mixed hyperlipidemia: Secondary | ICD-10-CM

## 2022-02-05 MED ORDER — EZETIMIBE 10 MG PO TABS: 10.0000 mg | ORAL_TABLET | Freq: Every day | ORAL | 3 refills | Status: DC

## 2022-02-05 NOTE — Progress Notes (Addendum)
Cardiology Office Note:    Date:  02/05/2022   ID:  Ashley Fields, DOB 1955/04/08, MRN [id number]  PCP:  Olin Hauser, DO  [facility] Cardiologist:  None  [facility] Electrophysiologist:  None   Referring MD: Nobie Putnam *   Chief Complaint: 1-2 month follow-up  History of Present Illness:    Ashley Fields is a 67 y.o. female with a hx of coronary calcification by CT, aortic atherosclerosis, bipolar disorder, prior tobacco abuse, HLD, gastric bypass, Hollenhort plaque R eye, mild carotid artery disease, arthritis, depression, GERD, hepatitis C who presents for 1 month follow-up.   The patient has a history of coronary calcification on CT scan (coronary calcium score 2456, 99th percentile for age and sex matched) with negative stress test in 2019 and August 2022.   She was last seen 12/2021 vie telehealth for pre-op for Lumbar fusion L5 and S1. She was stable from a cardiac perspective and deemed an acceptable risk.   Today, the patient reports she had the fusion and now has a fracture in S2. She is still having back pain. She is being treated conservatively for the fracture. She was recently sick with GI issues. She had diarrhea and was unable to eat. She was drinking adult Pedialyte. She is not back to eating normal foods yet. She was experiencing heart fluttering during the GI illness. She also has chronic and unchanged on the left flank area, she feels it may be from implants. She takes lasix as needed. She needs refills of Zetia.   Past Medical History:  Diagnosis Date   Allergy    Anemia    Anxiety    Arthritis    Yes   Back pain    Blood transfusion without reported diagnosis 1977   Transfusions from miscarriage & hemorrhaging   Chronic kidney disease    Depression    Disp fx of cuboid bone of right foot, init for clos fx 01/01/2017   Fibromyalgia    GERD (gastroesophageal reflux disease)    Headache    Hepatitis C    Hyperlipidemia    Lumbar  radiculopathy    Neck pain    Neuromuscular disorder ([place]) 1997   Falling to much was an eye-opener   Osteoporosis    Thyroid disease    Urine incontinence     Past Surgical History:  Procedure Laterality Date   5 miscarriages     1977-1985, Blood transfusion s/p miscarriage 1977   ABDOMINAL EXPOSURE N/A 12/18/2021   Procedure: ABDOMINAL EXPOSURE;  Surgeon: Marty Heck, MD;  Location: [facility];  Service: Vascular;  Laterality: N/A;   ABDOMINAL HYSTERECTOMY  1987   Total   ANTERIOR LUMBAR FUSION N/A 12/18/2021   Procedure: Anterior Lumbar Interbody Fusion  - Lumbar Five-Sacral One;  Surgeon: Eustace Moore, MD;  Location: [facility];  Service: Neurosurgery;  Laterality: N/A;   APPENDECTOMY  12/2008   AUGMENTATION MAMMAPLASTY Bilateral 2015   Bilat   AUGMENTATION MAMMAPLASTY Bilateral 2018   implants redone w/ placement of implants under muscle   breast lift bilateral, implants  73/22/0254   bilateral, silicon naturel   BREAST REDUCTION SURGERY Bilateral 1997   CESAREAN SECTION  07/27/1981   Placenta Previa   CHOLECYSTECTOMY  03/2004   Lap surgery   COLONOSCOPY WITH PROPOFOL N/A 06/13/2020   Procedure: COLONOSCOPY WITH PROPOFOL;  Surgeon: Virgel Manifold, MD;  Location: [facility];  Service: Endoscopy;  Laterality: N/A;   COSMETIC SURGERY     Breast  implants w/ lift, tummy tuck, upper & lower Blepharop   ESOPHAGOGASTRODUODENOSCOPY (EGD) WITH PROPOFOL N/A 06/13/2020   Procedure: ESOPHAGOGASTRODUODENOSCOPY (EGD) WITH PROPOFOL;  Surgeon: Virgel Manifold, MD;  Location: [facility];  Service: Endoscopy;  Laterality: N/A;   EYE SURGERY  2017   Cataract surgery & lasik   GASTRIC BYPASS  2005   Laparoscopic   IVC FILTER INSERTION  12/2003   TrapEase Vena Cava Filter   LUMBAR LAMINECTOMY  06/2006   L4-L5 (spinal fusion)   mini tummy tuck  05/07/2016   Bilateral bra/back roll lift skin removal   neck fusion     c3-c7 cadavier bones and metal plates placed per  patient   neck surgery C3-C7  02/10/2015   ACDF   REDUCTION MAMMAPLASTY Bilateral 1997   SHOULDER ACROMIOPLASTY Left 03/27/2013   w/ labral debridement   SMALL INTESTINE SURGERY  12/24/2003   Lap Gastric Bypass   SPINAL CORD STIMULATOR IMPLANT  11/2010   removed 05/2011   SPINE SURGERY  2008 2017   Fusion L4-L5, ACDF C4-C7    Current Medications: Current Meds  Medication Sig   aspirin EC (ASPIRIN LOW DOSE) 81 MG tablet TAKE 1 TABLET BY MOUTH EVERY DAY   bisacodyl (DULCOLAX) 5 MG EC tablet Take 5 mg by mouth daily as needed for moderate constipation.   butalbital-acetaminophen-caffeine (FIORICET) 50-325-40 MG tablet Take 1 tablet by mouth 2 (two) times daily as needed for headache.   Cholecalciferol 250 MCG (10000 UT) CAPS Take 10,000 Units by mouth once a week.   cyanocobalamin (VITAMIN B12) 1000 MCG/ML injection Inject 1 mL (1,000 mcg total) into the muscle every 30 (thirty) days.   diclofenac Sodium (VOLTAREN) 1 % GEL Apply 2 g topically 4 (four) times daily as needed (pain).   estradiol (ESTRACE) 1 MG tablet TAKE 1 TABLET EVERY DAY (Patient taking differently: Take 1 mg by mouth daily.)   etodolac (LODINE) 500 MG tablet Take 1 tablet by mouth 2 (two) times daily.   fluticasone (FLONASE) 50 MCG/ACT nasal spray SPRAY 2 SPRAYS INTO EACH NOSTRIL EVERY DAY   furosemide (LASIX) 20 MG tablet Take 1 tablet (20 mg total) by mouth daily as needed for fluid or edema.   gabapentin (NEURONTIN) 600 MG tablet Take 1 tablet (600 mg total) by mouth in the morning, at noon, in the evening, and at bedtime.   hydrOXYzine (ATARAX/VISTARIL) 25 MG tablet TAKE 1/2 TO 1 TABLET BY MOUTH TWICE A DAY AS NEEDED FOR SEVERE PANIC ATTACKS/ ANXIETY (Patient taking differently: Take 12.5-25 mg by mouth 2 (two) times daily as needed for anxiety.)   Lifitegrast (XIIDRA) 5 % SOLN Apply 1 drop to eye 2 (two) times daily as needed. Each eye   omeprazole (PRILOSEC) 40 MG capsule Take 1 capsule (40 mg total) by mouth daily.    ondansetron (ZOFRAN ODT) 4 MG disintegrating tablet Take 1 tablet (4 mg total) by mouth every 8 (eight) hours as needed for nausea or vomiting.   oxyCODONE-acetaminophen (PERCOCET) 10-325 MG tablet Take 1 tablet by mouth every 4 (four) hours as needed for pain.   propranolol (INDERAL) 10 MG tablet TAKE 1 TABLET (10 MG TOTAL) BY MOUTH 3 (THREE) TIMES DAILY AS NEEDED. FOR ANXIETY ATTACKS   rizatriptan (MAXALT-MLT) 10 MG disintegrating tablet Take 1 tablet (10 mg total) by mouth as needed for migraine. May repeat in 2 hours if needed, that is max dose for 24 hours.   rosuvastatin (CRESTOR) 40 MG tablet TAKE 1 TABLET BY MOUTH EVERY  DAY   tiZANidine (ZANAFLEX) 4 MG tablet Take 1 tablet (4 mg total) by mouth 4 (four) times daily.   traZODone (DESYREL) 100 MG tablet Take 1-1.5 tablets (100-150 mg total) by mouth at bedtime as needed for sleep.   valACYclovir (VALTREX) 1000 MG tablet TAKE 1 TABLET (1,000 MG TOTAL) BY MOUTH DAILY. FOR 5-7 DAYS, MAY REPEAT IF NEED FOR COLD SORE   venlafaxine XR (EFFEXOR-XR) 75 MG 24 hr capsule TAKE ONE CAPSULE BY MOUTH DAILY WITH BREAKFAST (Patient taking differently: Take 75 mg by mouth daily with breakfast.)   Zoledronic Acid (ZOMETA) 4 MG/100ML IVPB Once per year from Orthopedic   [DISCONTINUED] ezetimibe (ZETIA) 10 MG tablet TAKE 1 TABLET BY MOUTH ONCE DAILY     Allergies:   Flagyl [metronidazole], Cymbalta [duloxetine hcl], and Tape   Social History   Socioeconomic History   Marital status: Married    Spouse name: Not on file   Number of children: 1   Years of education: High School   Highest education level: Some college, no degree  Occupational History   Occupation: disability  Tobacco Use   Smoking status: Former    Packs/day: 0.25    Years: 10.00    Total pack years: 2.50    Types: Cigarettes    Quit date: 11/29/1995    Years since quitting: 26.2   Smokeless tobacco: Former   Tobacco comments:    Quite 1997, didnt smoke hardly at all when I was  smoking.  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]   Social Determinants of Health   Financial Resource Strain: [place]  (04/06/2021)   Overall Financial Resource Strain (CARDIA)    Difficulty of Paying Living Expenses: Not very hard  Food Insecurity: No Food Insecurity (12/21/2021)   Hunger Vital Sign    Worried About Running Out of Food in the Last Year: Never true    Ran Out of Food in the Last Year: Never true  Transportation Needs: No Transportation Needs (12/21/2021)   PRAPARE - Hydrologist (Medical): No    Lack of Transportation (Non-Medical): No  Physical Activity: Insufficiently Active (04/06/2021)   Exercise Vital Sign    Days of Exercise per Week: 2 days    Minutes of Exercise per Session: 10 min  Stress: Stress Concern Present (04/06/2021)   [place]    Feeling of Stress : Very much  Social Connections: Moderately Isolated (04/06/2021)   Social Connection and Isolation Panel [NHANES]    Frequency of Communication with Friends and Family: More than three times a week    Frequency of Social Gatherings with Friends and Family: More than three times a week    Attends Religious Services: Never    Marine scientist or Organizations: No    Attends Music therapist: Never    Marital Status: Married     Family History: The patient's family history includes Arthritis in her brother, mother, and sister; COPD in her mother, sister, and sister; Cancer in her maternal grandfather and mother; Colon polyps  in her sister;  Depression in her sister, sister, and sister; Diabetes in her brother, brother, and mother; Early death in her father; Hearing loss in her sister; Heart attack in her brother, mother, and sister; Heart attack (age of onset: 34) in her father; Heart disease in her brother, brother, brother, brother, father, mother, and sister; Hypertension in her brother, brother, brother, mother, sister, sister, and sister; Lung cancer in her mother; Miscarriages / Stillbirths in her maternal aunt and paternal aunt; Obesity in her brother, brother, mother, sister, and sister; Stroke in her maternal grandfather and mother; Varicose Veins in her brother, mother, and sister. There is no history of Breast cancer.  ROS:   Please see the history of present illness.     All other systems reviewed and are negative.  EKGs/Labs/Other Studies Reviewed:    The following studies were reviewed today:  Myoview Lexicsan 08/2020  Narrative & Impression  There was no ST segment deviation noted during stress. No T wave inversion was noted during stress. Defect 1: There is a small defect of mild severity present in the apex location likely artifact. The study is normal. This is a low risk study. The left ventricular ejection fraction is normal (55-65%). CT attenuation images shows moderate aortic and coronary calcifications.    Myoview lexiscan 2019 Narrative & Impression  Pharmacological myocardial perfusion imaging study with no significant  ischemia Normal wall motion, EF estimated at 83% No EKG changes concerning for ischemia at peak stress or in recovery. Low risk scan   Cardiac CT scoring 2019 IMPRESSION: Coronary calcium score of 2456 . This was 54 th percentile for age and sex matched control.   Consider f/u heart catheterization or perfusion study given how extensive the coronary calcification is   EKG:  EKG is ordered today.  The ekg ordered today demonstrates NSR 70bpm, no ST/T wave changes  Recent  Labs: 04/28/2021: TSH 3.29 11/15/2021: ALT 58 12/27/2021: BUN 13; Creat 0.59; Hemoglobin 9.7; Platelets 244; Potassium 4.6; Sodium 142  Recent Lipid Panel    Component Value Date/Time   CHOL 132 04/28/2021 0824   TRIG 79 04/28/2021 0824   HDL 76 04/28/2021 0824   CHOLHDL 1.7 04/28/2021 0824   LDLCALC 40 04/28/2021 0824    Physical Exam:    VS:  BP 118/64 (BP Location: Right Arm, Patient Position: Sitting, Cuff Size: Large)   Pulse 70   Ht '5\' 2"'$  (1.575 m)   Wt 204 lb 8 oz (92.8 kg)   SpO2 96%   BMI 37.40 kg/m     Wt Readings from Last 3 Encounters:  02/05/22 204 lb 8 oz (92.8 kg)  12/27/21 212 lb (96.2 kg)  12/18/21 178 lb (80.7 kg)     GEN:  Well nourished, well developed in no acute distress HEENT: Normal NECK: No JVD; No carotid bruits LYMPHATICS: No lymphadenopathy CARDIAC: RRR, + murmur, no rubs, gallops RESPIRATORY:  Clear to auscultation without rales, wheezing or rhonchi  ABDOMEN: Soft, non-tender, non-distended MUSCULOSKELETAL:  No edema; No deformity  SKIN: Warm and dry NEUROLOGIC:  Alert and oriented x 3 PSYCHIATRIC:  Normal affect   ASSESSMENT:    1. Atherosclerosis of native coronary artery of native heart with stable angina pectoris ([place])   2. Mixed hyperlipidemia   3. Hyperlipidemia, mixed   4. Murmur   5. Palpitations    PLAN:    In order of problems listed above:  Nonobstructive CAD Patient reports chronic left sided flank pain that she says is likely  from implants. Chest discomfort is unchanged. She had prior Cardiac scoring in 2019 showing coronary calcium score of 2456, 99th percentile for age and sex matched control. She had negative Myoview lexiscan in 2019 and 2022. EKG today with no ischemic changes. We will continue to watch and wait. Continue Aspirin '81mg'$  daily, Zetia, Crestor and propranolol.  HLD LDL 40 in 2023. Refill Zetia today. Continue Crestor '40mg'$  daily.   Murmur I will order an echocardiogram.  Palpitations She reports  increase palpitations in the setting of recent GI illness and dehydration. Symptoms are slowly improving. We will continue to watch and wait for now. Continue propranolol '10mg'$  TID.   H/o tobacco  use Quit in 1997.   Disposition: Follow up in 1 year(s) with MD/APP    Signed, Teyah Rossy Ninfa Meeker, PA-C  02/05/2022 10:18 AM    [facility]

## 2022-02-05 NOTE — Patient Instructions (Signed)
Medication Instructions:  No changes *If you need a refill on your cardiac medications before your next appointment, please call your pharmacy*   Lab Work: None ordered If you have labs (blood work) drawn today and your tests are completely normal, you will receive your results only by: [facility] (if you have MyChart) OR A paper copy in the mail If you have any lab test that is abnormal or we need to change your treatment, we will call you to review the results.   Testing/Procedures: Your physician has requested that you have an echocardiogram. Echocardiography is a painless test that uses sound waves to create images of your heart. It provides your doctor with information about the size and shape of your heart and how well your heart's chambers and valves are working.   You may receive an ultrasound enhancing agent through an IV if needed to better visualize your heart during the echo. This procedure takes approximately one hour.  There are no restrictions for this procedure.  This will take place at [facility] ([id number], [place]    Follow-Up: At [facility], you and your health needs are our priority.  As part of our continuing mission to provide you with exceptional heart care, we have created designated Provider Care Teams.  These Care Teams include your primary Cardiologist (physician) and Advanced Practice Providers (APPs -  Physician Assistants and Nurse Practitioners) who all work together to provide you with the care you need, when you need it.  We recommend signing up for the patient portal called "MyChart".  Sign up information is provided on this After Visit Summary.  MyChart is used to connect with patients for Virtual Visits (Telemedicine).  Patients are able to view lab/test results, encounter notes, upcoming appointments, etc.  Non-urgent messages can be sent to your provider as well.   To learn more about what you can  do with MyChart, go to [email or web address].    Your next appointment:   12 month(s)  Provider:   You may see Dr. Rockey Situ or one of the following Advanced Practice Providers on your designated Care Team:   Murray Hodgkins, NP Christell Faith, PA-C Cadence Kathlen Mody, PA-C Gerrie Nordmann, NP

## 2022-02-07 DIAGNOSIS — H04123 Dry eye syndrome of bilateral lacrimal glands: Secondary | ICD-10-CM | POA: Diagnosis not present

## 2022-02-07 DIAGNOSIS — H538 Other visual disturbances: Secondary | ICD-10-CM | POA: Diagnosis not present

## 2022-02-07 DIAGNOSIS — H35371 Puckering of macula, right eye: Secondary | ICD-10-CM | POA: Diagnosis not present

## 2022-02-07 DIAGNOSIS — H43813 Vitreous degeneration, bilateral: Secondary | ICD-10-CM | POA: Diagnosis not present

## 2022-02-07 DIAGNOSIS — H16223 Keratoconjunctivitis sicca, not specified as Sjogren's, bilateral: Secondary | ICD-10-CM | POA: Diagnosis not present

## 2022-02-07 DIAGNOSIS — H43393 Other vitreous opacities, bilateral: Secondary | ICD-10-CM | POA: Diagnosis not present

## 2022-02-09 ENCOUNTER — Other Ambulatory Visit: Payer: Self-pay

## 2022-02-09 ENCOUNTER — Ambulatory Visit: Payer: Medicare HMO | Admitting: Urology

## 2022-02-09 ENCOUNTER — Other Ambulatory Visit
Admission: RE | Admit: 2022-02-09 | Discharge: 2022-02-09 | Disposition: A | Discharge status: Home / Self Care | Payer: Medicare HMO | Attending: Urology | Admitting: Urology

## 2022-02-09 ENCOUNTER — Encounter: Payer: Self-pay | Admitting: Urology

## 2022-02-09 VITALS — BP 142/84 | HR 79 | Ht 62.0 in | Wt 195.0 lb

## 2022-02-09 DIAGNOSIS — N319 Neuromuscular dysfunction of bladder, unspecified: Secondary | ICD-10-CM

## 2022-02-09 DIAGNOSIS — R32 Unspecified urinary incontinence: Secondary | ICD-10-CM | POA: Diagnosis not present

## 2022-02-09 LAB — URINALYSIS, COMPLETE (UACMP) WITH MICROSCOPIC
Bacteria, UA: NONE SEEN
Glucose, UA: NEGATIVE mg/dL
Hgb urine dipstick: NEGATIVE
Leukocytes,Ua: NEGATIVE
Nitrite: NEGATIVE
Protein, ur: NEGATIVE mg/dL
Specific Gravity, Urine: 1.025 (ref 1.005–1.030)
WBC, UA: NONE SEEN WBC/hpf (ref 0–5)
pH: 5.5 (ref 5.0–8.0)

## 2022-02-09 LAB — BLADDER SCAN AMB NON-IMAGING

## 2022-02-09 MED ORDER — GEMTESA 75 MG PO TABS: 75.0000 mg | ORAL_TABLET | Freq: Every day | ORAL | 0 refills | Status: DC

## 2022-02-09 NOTE — Progress Notes (Signed)
Haze Rushing Plume,acting as a scribe for Hollice Espy, MD.,have documented all relevant documentation on the behalf of Hollice Espy, MD,as directed by  Hollice Espy, MD while in the presence of Hollice Espy, MD.  02/09/2022 10:12 AM   Ashley Fields 02-12-1955 [id number]  Referring provider: Eustace Moore, MD [address] [place],  [place] [address]  Chief Complaint  Patient presents with   New Patient (Initial Visit)    Neurogenic bladder     HPI: 67 year-old female who presents today for a further evaluation of possible neurogenic bladder.   She is status post ALIF L1/S1 and PLIF L2-L3 for chronic pain by neurosurgeon Dr. Ronnald Ramp performed on 12/18/2021. Postoperatively, she developed bowel and bladder incontinence along with perineal numbness. 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 An emergent MRI showed no canal stenosis. When she was seen most recently by neurosurgery, she states that her symptoms had started to improve.   Today, she has communicated that she is experiencing a lack of sensation and control over her bowels and bladder. She has mentioned urinary leakage, a weak stream, as well as genital pain and swelling. She has been wearing Depends and goes to the bathroom every hour on a timed basis to avoid excessive episodes of incontinence. She denies having to lean forward or push to begin urinating.  She reports that her urine just comes, sometimes before she can get her pants down, sometimes when she is sitting there but when it does, she does not actually feel it leaving her body there have been episodes of enuresis at night. She states mild improvement in her symptoms since her surgery.   Here most recent creatinine was 0.59 on 12/27/2021. Her PVR was 50.    Results for orders placed or performed in visit on 02/09/22  Bladder Scan (Post Void Residual) in office  Result Value Ref Range   Scan Result 32m    Results for orders placed or performed during the hospital encounter of 02/09/22  Urinalysis, Complete w Microscopic -  Result Value Ref Range   Color, Urine YELLOW YELLOW   APPearance CLEAR CLEAR   Specific Gravity, Urine 1.025 1.005 - 1.030   pH 5.5 5.0 - 8.0   Glucose, UA NEGATIVE NEGATIVE mg/dL   Hgb urine dipstick NEGATIVE NEGATIVE   Bilirubin Urine LARGE (A) NEGATIVE   Ketones, ur TRACE (A) NEGATIVE mg/dL   Protein, ur NEGATIVE NEGATIVE mg/dL   Nitrite NEGATIVE NEGATIVE   Leukocytes,Ua NEGATIVE NEGATIVE   Squamous Epithelial / HPF 0-5 0 - 5 /HPF   WBC, UA NONE SEEN 0 - 5 WBC/hpf   RBC / HPF 0-5 0 - 5 RBC/hpf   Bacteria, UA NONE SEEN NONE SEEN      PMH: Past Medical History:  Diagnosis Date   Allergy    Anemia    Anxiety    Arthritis    Yes   Back pain    Blood transfusion without reported diagnosis 1977   Transfusions from miscarriage & hemorrhaging   Chronic kidney disease    Depression    Disp fx of cuboid bone of right foot, init for clos fx 01/01/2017   Fibromyalgia    GERD (gastroesophageal reflux disease)    Headache    Hepatitis C    Hyperlipidemia    Lumbar radiculopathy    Neck pain    [place]   Falling to much was an eye-opener  Osteoporosis    Thyroid disease    Urine incontinence     Surgical History: Past Surgical History:  Procedure Laterality Date   5 miscarriages     1977-1985, Blood transfusion s/p miscarriage 1977   ABDOMINAL EXPOSURE N/A 12/18/2021   Procedure: ABDOMINAL EXPOSURE;  Surgeon: Marty Heck, MD;  Location: [facility] OR;  Service: Vascular;  Laterality: N/A;   ABDOMINAL HYSTERECTOMY  1987   Total   ANTERIOR LUMBAR FUSION N/A 12/18/2021   Procedure: Anterior Lumbar Interbody Fusion  - Lumbar Five-Sacral One;  Surgeon: Eustace Moore, MD;  Location: [facility];  Service: Neurosurgery;  Laterality: N/A;   APPENDECTOMY  12/2008   AUGMENTATION MAMMAPLASTY Bilateral 2015   Bilat   AUGMENTATION  MAMMAPLASTY Bilateral 2018   implants redone w/ placement of implants under muscle   breast lift bilateral, implants  54/09/8117   bilateral, silicon naturel   BREAST REDUCTION SURGERY Bilateral 1997   CESAREAN SECTION  07/27/1981   Placenta Previa   CHOLECYSTECTOMY  03/2004   Lap surgery   COLONOSCOPY WITH PROPOFOL N/A 06/13/2020   Procedure: COLONOSCOPY WITH PROPOFOL;  Surgeon: Virgel Manifold, MD;  Location: [facility] ENDOSCOPY;  Service: Endoscopy;  Laterality: N/A;   COSMETIC SURGERY     Breast implants w/ lift, tummy tuck, upper & lower Blepharop   ESOPHAGOGASTRODUODENOSCOPY (EGD) WITH PROPOFOL N/A 06/13/2020   Procedure: ESOPHAGOGASTRODUODENOSCOPY (EGD) WITH PROPOFOL;  Surgeon: Virgel Manifold, MD;  Location: [facility] ENDOSCOPY;  Service: Endoscopy;  Laterality: N/A;   EYE SURGERY  2017   Cataract surgery & lasik   GASTRIC BYPASS  2005   Laparoscopic   IVC FILTER INSERTION  12/2003   TrapEase Vena Cava Filter   LUMBAR LAMINECTOMY  06/2006   L4-L5 (spinal fusion)   mini tummy tuck  05/07/2016   Bilateral bra/back roll lift skin removal   neck fusion     c3-c7 cadavier bones and metal plates placed per patient   neck surgery C3-C7  02/10/2015   ACDF   PROSTATE SURGERY     REDUCTION MAMMAPLASTY Bilateral 1997   SHOULDER ACROMIOPLASTY Left 03/27/2013   w/ labral debridement   SMALL INTESTINE SURGERY  12/24/2003   Lap Gastric Bypass   SPINAL CORD STIMULATOR IMPLANT  11/2010   removed 05/2011   SPINE SURGERY  2008 2017   Fusion L4-L5, ACDF C4-C7    Home Medications:  Allergies as of 02/09/2022       Reactions   Flagyl [metronidazole] Anaphylaxis   Cymbalta [duloxetine Hcl] Anxiety   Tape Rash   Paper tape okay to use per patient.        Medication List        Accurate as of February 09, 2022 10:12 AM. If you have any questions, ask your nurse or doctor.          Aspirin Low Dose 81 MG tablet Generic drug: aspirin EC TAKE 1 TABLET BY MOUTH EVERY DAY    bisacodyl 5 MG EC tablet Commonly known as: DULCOLAX Take 5 mg by mouth daily as needed for moderate constipation.   butalbital-acetaminophen-caffeine 50-325-40 MG tablet Commonly known as: FIORICET Take 1 tablet by mouth 2 (two) times daily as needed for headache.   Cholecalciferol 250 MCG (10000 UT) Caps Take 10,000 Units by mouth once a week.   cyanocobalamin 1000 MCG/ML injection Commonly known as: VITAMIN B12 Inject 1 mL (1,000 mcg total) into the muscle every 30 (thirty) days.   diclofenac Sodium 1 % Gel Commonly known  as: VOLTAREN Apply 2 g topically 4 (four) times daily as needed (pain).   estradiol 1 MG tablet Commonly known as: ESTRACE TAKE 1 TABLET EVERY DAY   etodolac 500 MG tablet Commonly known as: LODINE Take 1 tablet by mouth 2 (two) times daily.   ezetimibe 10 MG tablet Commonly known as: ZETIA Take 1 tablet (10 mg total) by mouth daily.   fluticasone 50 MCG/ACT nasal spray Commonly known as: FLONASE SPRAY 2 SPRAYS INTO EACH NOSTRIL EVERY DAY   furosemide 20 MG tablet Commonly known as: LASIX Take 1 tablet (20 mg total) by mouth daily as needed for fluid or edema.   gabapentin 600 MG tablet Commonly known as: NEURONTIN Take 1 tablet (600 mg total) by mouth in the morning, at noon, in the evening, and at bedtime.   Gemtesa 75 MG Tabs Generic drug: Vibegron Take 1 tablet (75 mg total) by mouth daily. Started by: Hollice Espy, MD   hydrOXYzine 25 MG tablet Commonly known as: ATARAX TAKE 1/2 TO 1 TABLET BY MOUTH TWICE A DAY AS NEEDED FOR SEVERE PANIC ATTACKS/ ANXIETY What changed: See the new instructions.   omeprazole 40 MG capsule Commonly known as: PRILOSEC Take 1 capsule (40 mg total) by mouth daily.   ondansetron 4 MG disintegrating tablet Commonly known as: Zofran ODT Take 1 tablet (4 mg total) by mouth every 8 (eight) hours as needed for nausea or vomiting.   oxyCODONE-acetaminophen 10-325 MG tablet Commonly known as:  PERCOCET Take 1 tablet by mouth every 4 (four) hours as needed for pain.   propranolol 10 MG tablet Commonly known as: INDERAL TAKE 1 TABLET (10 MG TOTAL) BY MOUTH 3 (THREE) TIMES DAILY AS NEEDED. FOR ANXIETY ATTACKS   rizatriptan 10 MG disintegrating tablet Commonly known as: MAXALT-MLT Take 1 tablet (10 mg total) by mouth as needed for migraine. May repeat in 2 hours if needed, that is max dose for 24 hours.   rosuvastatin 40 MG tablet Commonly known as: CRESTOR TAKE 1 TABLET BY MOUTH EVERY DAY   tiZANidine 4 MG tablet Commonly known as: ZANAFLEX Take 1 tablet (4 mg total) by mouth 4 (four) times daily.   traZODone 100 MG tablet Commonly known as: DESYREL Take 1-1.5 tablets (100-150 mg total) by mouth at bedtime as needed for sleep.   valACYclovir 1000 MG tablet Commonly known as: VALTREX TAKE 1 TABLET (1,000 MG TOTAL) BY MOUTH DAILY. FOR 5-7 DAYS, MAY REPEAT IF NEED FOR COLD SORE   venlafaxine XR 75 MG 24 hr capsule Commonly known as: EFFEXOR-XR TAKE ONE CAPSULE BY MOUTH DAILY WITH BREAKFAST What changed: See the new instructions.   Xiidra 5 % Soln Generic drug: Lifitegrast Apply 1 drop to eye 2 (two) times daily as needed. Each eye   Zoledronic Acid 4 MG/100ML IVPB Commonly known as: ZOMETA Once per year from Orthopedic        Allergies:  Allergies  Allergen Reactions   Flagyl [Metronidazole] Anaphylaxis   Cymbalta [Duloxetine Hcl] Anxiety   Tape Rash    Paper tape okay to use per patient.    Family History: Family History  Problem Relation Age of Onset   COPD Mother    Lung cancer Mother    Diabetes Mother    Heart disease Mother    Stroke Mother    Heart attack Mother    Arthritis Mother    Cancer Mother    Hypertension Mother    Obesity Mother    Varicose Veins Mother  Heart disease Father    Heart attack Father 35   Early death Father    Depression Sister        x 5 sisters   COPD Sister    Hypertension Sister    Heart disease  Brother        x 3 brothers   Diabetes Brother    Colon polyps Sister    Hearing loss Sister    Heart attack Sister    Heart attack Brother    Arthritis Sister    Varicose Veins Sister    Arthritis Brother    Heart disease Brother    Hypertension Brother    Cancer Maternal Grandfather    Stroke Maternal Grandfather    COPD Sister    Obesity Sister    Depression Sister    Depression Sister    Heart disease Sister    Hypertension Sister    Diabetes Brother    Heart disease Brother    Hypertension Brother    Obesity Brother    Varicose Veins Brother    Heart disease Brother    Hypertension Brother    Obesity Brother    Hypertension Sister    Obesity Sister    Miscarriages / Stillbirths Maternal Aunt    Miscarriages / Stillbirths Paternal Aunt    Breast cancer Neg Hx     Social History:  reports that she quit smoking about 26 years ago. Her smoking use included cigarettes. She has a 2.50 pack-year smoking history. She has been exposed to tobacco smoke. She has quit using smokeless tobacco. She reports that she does not drink alcohol and does not use drugs.   Physical Exam: BP (!) 142/84 (BP Location: Left Arm, Patient Position: Sitting, Cuff Size: Large)   Pulse 79   Ht '5\' 2"'$  (1.575 m)   Wt 195 lb (88.5 kg)   BMI 35.67 kg/m   Constitutional:  Alert and oriented, No acute distress. HEENT: [place] AT, moist mucus membranes.  Trachea midline, no masses. Neurologic: Grossly intact, no focal deficits, moving all 4 extremities. Psychiatric: Normal mood and affect.   Assessment & Plan:    Neurogenic bladder - She is emptying well and seems to be improving from a neurologic standpoint.  - She has severe overactivity as her primary feature - Recommend continued time voiding.  -We will try Gemtesa 75 mg for the next month to help with overactivity and follow up with a PVR in two weeks to ensure that she is adequately voiding.  - Retention precautions were given, hold Gemtesa  for this. -Will avoid constipation given her history of severe dry eye - We also discussed other alternatives including neurogenic doses of Botox with self-cath, which she declined.  -Role of urodynamics fairly limited at this point in time unless bladder symptoms persist or worsen  Return in about 2 weeks (around 02/23/2022) for follow up .  I have reviewed the above documentation for accuracy and completeness, and I agree with the above.   Hollice Espy, MD   [facility] [address], [place], [place] [address] [phone]

## 2022-02-11 DIAGNOSIS — R002 Palpitations: Secondary | ICD-10-CM

## 2022-02-12 ENCOUNTER — Encounter: Payer: Self-pay | Admitting: Urology

## 2022-02-12 ENCOUNTER — Ambulatory Visit: Payer: Medicare HMO | Attending: Medical

## 2022-02-12 DIAGNOSIS — R002 Palpitations: Secondary | ICD-10-CM

## 2022-02-13 ENCOUNTER — Encounter: Payer: Self-pay | Admitting: Family Medicine

## 2022-02-15 DIAGNOSIS — R002 Palpitations: Secondary | ICD-10-CM

## 2022-02-20 ENCOUNTER — Encounter: Payer: Self-pay | Admitting: Psychiatry

## 2022-02-20 ENCOUNTER — Telehealth (INDEPENDENT_AMBULATORY_CARE_PROVIDER_SITE_OTHER): Payer: Medicare HMO | Admitting: Psychiatry

## 2022-02-20 DIAGNOSIS — F4310 Post-traumatic stress disorder, unspecified: Secondary | ICD-10-CM

## 2022-02-20 DIAGNOSIS — F3162 Bipolar disorder, current episode mixed, moderate: Secondary | ICD-10-CM

## 2022-02-20 DIAGNOSIS — G4701 Insomnia due to medical condition: Secondary | ICD-10-CM | POA: Diagnosis not present

## 2022-02-20 DIAGNOSIS — F410 Panic disorder [episodic paroxysmal anxiety] without agoraphobia: Secondary | ICD-10-CM | POA: Diagnosis not present

## 2022-02-20 MED ORDER — VENLAFAXINE HCL ER 75 MG PO CP24: 75.0000 mg | ORAL_CAPSULE | Freq: Every day | ORAL | 1 refills | Status: DC

## 2022-02-20 MED ORDER — HYDROXYZINE HCL 25 MG PO TABS: 25.0000 mg | ORAL_TABLET | Freq: Three times a day (TID) | ORAL | 1 refills | Status: DC | PRN

## 2022-02-20 MED ORDER — OXCARBAZEPINE 150 MG PO TABS: 150.0000 mg | ORAL_TABLET | Freq: Two times a day (BID) | ORAL | 1 refills | Status: DC

## 2022-02-20 NOTE — Progress Notes (Unsigned)
Virtual Visit via Video Note  I connected with Ashley Fields on 02/20/22 at  8:30 AM EST by a video enabled telemedicine application and verified that I am speaking with the correct person using two identifiers.  Location Provider Location : [facility] Patient Location : Home  Participants: Patient , Provider   I discussed the limitations of evaluation and management by telemedicine and the availability of in person appointments. The patient expressed understanding and agreed to proceed.   I discussed the assessment and treatment plan with the patient. The patient was provided an opportunity to ask questions and all were answered. The patient agreed with the plan and demonstrated an understanding of the instructions.   The patient was advised to call back or seek an in-person evaluation if the symptoms worsen or if the condition fails to improve as anticipated.   [place] MD OP Progress Note  02/20/2022 9:02 AM Ashley Fields  MRN:  [id number]  Chief Complaint:  Chief Complaint  Patient presents with   Follow-up   Anxiety   Depression   Medication Refill   HPI: Ashley Fields is a 67 year old Caucasian female, married, on disability, lives in [facility], has a history of bipolar disorder, PTSD, chronic pain, migraine headaches, history of laparoscopic gastric bypass was evaluated by telemedicine today.  Patient today reports she continues to be in a lot of pain, status post lumbar spinal fracture.  Reports she currently has a bone growth stimulator.  She also has a cardiac monitor for recent palpitations.  Continues to follow-up with her pain provider as well as her cardiologist.  Reports she was recently advised to stop taking the propranolol and probably start metoprolol.  She however would like to wait until she is done with her cardiac monitoring.  She continues to take the propranolol as needed for anxiety.  Patient however reports given all her situational stressors as well as the fact that her  husband is also going through medical problems at this time she has been getting more and more anxious, has been having mood swings, sadness, sleep problems.  She reports she does not sleep well at night and has been taking naps during the day.  She also is unable to do a lot of chores and activities due to her recent spinal fracture.  Patient is currently on venlafaxine, denies side effects.  Patient is interested in medication changes.  Denies any suicidality, homicidality or perceptual disturbances.  Currently does not have a therapist however is motivated to establish care.  3 word memory immediate 3 out of 3, after 5 minutes 3 out of 3.  Attention and focus seem to be good in session today, was able to spell the word 'World' forward and backward.  Patient denies any other concerns today.  Visit Diagnosis:    ICD-10-CM   1. Bipolar 1 disorder, mixed, moderate (HCC)  F31.62 OXcarbazepine (TRILEPTAL) 150 MG tablet    2. PTSD (post-traumatic stress disorder)  F43.10 OXcarbazepine (TRILEPTAL) 150 MG tablet    venlafaxine XR (EFFEXOR-XR) 75 MG 24 hr capsule    hydrOXYzine (ATARAX) 25 MG tablet    3. Panic disorder  F41.0     4. Insomnia due to medical condition  G47.01    pain, lack of sleep hygiene      Past Psychiatric History: Reviewed past psychiatric history from progress note on 05/15/2018.  Past trials of Prozac, Wellbutrin, Lexapro, Cymbalta, Rexulti, Ambien-nightmares, Lunesta, Lamictal, risperidone.  Past Medical History:  Past Medical History:  Diagnosis Date  Allergy    Anemia    Anxiety    Arthritis    Yes   Back pain    Blood transfusion without reported diagnosis 1977   Transfusions from miscarriage & hemorrhaging   Chronic kidney disease    Depression    Disp fx of cuboid bone of right foot, init for clos fx 01/01/2017   Fibromyalgia    GERD (gastroesophageal reflux disease)    Headache    Hepatitis C    Hyperlipidemia    Lumbar radiculopathy    Neck  pain    Neuromuscular disorder ([place]) 1997   Falling to much was an eye-opener   Osteoporosis    Thyroid disease    Urine incontinence     Past Surgical History:  Procedure Laterality Date   5 miscarriages     1977-1985, Blood transfusion s/p miscarriage 1977   ABDOMINAL EXPOSURE N/A 12/18/2021   Procedure: ABDOMINAL EXPOSURE;  Surgeon: Marty Heck, MD;  Location: [place];  Service: Vascular;  Laterality: N/A;   ABDOMINAL HYSTERECTOMY  1987   Total   ANTERIOR LUMBAR FUSION N/A 12/18/2021   Procedure: Anterior Lumbar Interbody Fusion  - Lumbar Five-Sacral One;  Surgeon: Eustace Moore, MD;  Location: [place];  Service: Neurosurgery;  Laterality: N/A;   APPENDECTOMY  12/2008   AUGMENTATION MAMMAPLASTY Bilateral 2015   Bilat   AUGMENTATION MAMMAPLASTY Bilateral 2018   implants redone w/ placement of implants under muscle   breast lift bilateral, implants  0000000   bilateral, silicon naturel   BREAST REDUCTION SURGERY Bilateral 1997   CESAREAN SECTION  07/27/1981   Placenta Previa   CHOLECYSTECTOMY  03/2004   Lap surgery   COLONOSCOPY WITH PROPOFOL N/A 06/13/2020   Procedure: COLONOSCOPY WITH PROPOFOL;  Surgeon: Virgel Manifold, MD;  Location: [facility];  Service: Endoscopy;  Laterality: N/A;   COSMETIC SURGERY     Breast implants w/ lift, tummy tuck, upper & lower Blepharop   ESOPHAGOGASTRODUODENOSCOPY (EGD) WITH PROPOFOL N/A 06/13/2020   Procedure: ESOPHAGOGASTRODUODENOSCOPY (EGD) WITH PROPOFOL;  Surgeon: Virgel Manifold, MD;  Location: [facility];  Service: Endoscopy;  Laterality: N/A;   EYE SURGERY  2017   Cataract surgery & lasik   GASTRIC BYPASS  2005   Laparoscopic   IVC FILTER INSERTION  12/2003   TrapEase Vena Cava Filter   LUMBAR LAMINECTOMY  06/2006   L4-L5 (spinal fusion)   mini tummy tuck  05/07/2016   Bilateral bra/back roll lift skin removal   neck fusion     c3-c7 cadavier bones and metal plates placed per patient   neck surgery  C3-C7  02/10/2015   ACDF   PROSTATE SURGERY     REDUCTION MAMMAPLASTY Bilateral 1997   SHOULDER ACROMIOPLASTY Left 03/27/2013   w/ labral debridement   SMALL INTESTINE SURGERY  12/24/2003   Lap Gastric Bypass   SPINAL CORD STIMULATOR IMPLANT  11/2010   removed 05/2011   SPINE SURGERY  2008 2017   Fusion L4-L5, ACDF C4-C7    Family Psychiatric History: Reviewed family psychiatric history from progress note on 05/15/2018.  Family History:  Family History  Problem Relation Age of Onset   COPD Mother    Lung cancer Mother    Diabetes Mother    Heart disease Mother    Stroke Mother    Heart attack Mother    Arthritis Mother    Cancer Mother    Hypertension Mother    Obesity Mother    Varicose Veins Mother  Heart disease Father    Heart attack Father 44   Early death Father    Depression Sister        x 5 sisters   COPD Sister    Hypertension Sister    Heart disease Brother        x 3 brothers   Diabetes Brother    Colon polyps Sister    Hearing loss Sister    Heart attack Sister    Heart attack Brother    Arthritis Sister    Varicose Veins Sister    Arthritis Brother    Heart disease Brother    Hypertension Brother    Cancer Maternal Grandfather    Stroke Maternal Grandfather    COPD Sister    Obesity Sister    Depression Sister    Depression Sister    Heart disease Sister    Hypertension Sister    Diabetes Brother    Heart disease Brother    Hypertension Brother    Obesity Brother    Varicose Veins Brother    Heart disease Brother    Hypertension Brother    Obesity Brother    Hypertension Sister    Obesity Sister    Miscarriages / Stillbirths Maternal Aunt    Miscarriages / Stillbirths Paternal Aunt    Breast cancer Neg Hx     Social History: Reviewed social history from progress note on 05/15/2018. Social History   Socioeconomic History   Marital status: Married    Spouse name: Not on file   Number of children: 1   Years of education: High  School   Highest education level: Some college, no degree  Occupational History   Occupation: disability  Tobacco Use   Smoking status: Former    Packs/day: 0.25    Years: 10.00    Total pack years: 2.50    Types: Cigarettes    Quit date: 11/29/1995    Years since quitting: 26.2    Passive exposure: Past   Smokeless tobacco: Former   Tobacco comments:    Quite 1997, didnt smoke hardly at all when I was smoking.  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]   Social Determinants of Health   Financial Resource Strain: [place]  (04/06/2021)   Overall Financial Resource Strain (CARDIA)    Difficulty of Paying Living Expenses: Not very hard  Food Insecurity: No Food Insecurity (12/21/2021)   Hunger Vital Sign    Worried About Running Out of Food in the Last Year: Never true    Ran Out of Food in the Last Year: Never true  Transportation Needs: No Transportation Needs (12/21/2021)   PRAPARE - Hydrologist (Medical): No    Lack of Transportation (Non-Medical): No  Physical Activity: Insufficiently Active (04/06/2021)   Exercise Vital Sign    Days of Exercise per Week: 2 days    Minutes of Exercise per Session: 10 min  Stress: Stress Concern Present (04/06/2021)   [place]    Feeling of Stress : Very much  Social Connections:  Moderately Isolated (04/06/2021)   Social Connection and Isolation Panel [NHANES]    Frequency of Communication with Friends and Family: More than three times a week    Frequency of Social Gatherings with Friends  and Family: More than three times a week    Attends Religious Services: Never    Marine scientist or Organizations: No    Attends Archivist Meetings: Never    Marital Status: Married    Allergies:  Allergies  Allergen Reactions   Flagyl [Metronidazole] Anaphylaxis   Cymbalta [Duloxetine Hcl] Anxiety   Tape Rash    Paper tape okay to use per patient.    Metabolic Disorder Labs: Lab Results  Component Value Date   HGBA1C 4.8 04/28/2021   MPG 91 04/28/2021   MPG 97 03/30/2020   No results found for: "PROLACTIN" Lab Results  Component Value Date   CHOL 132 04/28/2021   TRIG 79 04/28/2021   HDL 76 04/28/2021   CHOLHDL 1.7 04/28/2021   LDLCALC 40 04/28/2021   LDLCALC 49 03/30/2020   Lab Results  Component Value Date   TSH 3.29 04/28/2021   TSH 1.97 03/30/2020    Therapeutic Level Labs: No results found for: "LITHIUM" No results found for: "VALPROATE" No results found for: "CBMZ"  Current Medications: Current Outpatient Medications  Medication Sig Dispense Refill   aspirin EC (ASPIRIN LOW DOSE) 81 MG tablet TAKE 1 TABLET BY MOUTH EVERY DAY 90 tablet 0   bisacodyl (DULCOLAX) 5 MG EC tablet Take 5 mg by mouth daily as needed for moderate constipation.     butalbital-acetaminophen-caffeine (FIORICET) 50-325-40 MG tablet Take 1 tablet by mouth 2 (two) times daily as needed for headache.     Cholecalciferol 250 MCG (10000 UT) CAPS Take 10,000 Units by mouth once a week.     cyanocobalamin (VITAMIN B12) 1000 MCG/ML injection Inject 1 mL (1,000 mcg total) into the muscle every 30 (thirty) days. 1 mL 11   diclofenac Sodium (VOLTAREN) 1 % GEL Apply 2 g topically 4 (four) times daily as needed (pain).     estradiol (ESTRACE) 1 MG tablet TAKE 1 TABLET EVERY DAY (Patient taking differently: Take 1 mg by mouth daily.) 90 tablet 1   etodolac (LODINE) 500 MG tablet Take 1 tablet by mouth 2 (two) times daily.     ezetimibe (ZETIA) 10 MG tablet Take 1 tablet (10 mg  total) by mouth daily. 90 tablet 3   fluticasone (FLONASE) 50 MCG/ACT nasal spray SPRAY 2 SPRAYS INTO EACH NOSTRIL EVERY DAY 48 mL 0   gabapentin (NEURONTIN) 600 MG tablet Take 1 tablet (600 mg total) by mouth in the morning, at noon, in the evening, and at bedtime. 360 tablet 1   omeprazole (PRILOSEC) 40 MG capsule Take 1 capsule (40 mg total) by mouth daily. 90 capsule 0   ondansetron (ZOFRAN ODT) 4 MG disintegrating tablet Take 1 tablet (4 mg total) by mouth every 8 (eight) hours as needed for nausea or vomiting. 30 tablet 2   OXcarbazepine (TRILEPTAL) 150 MG tablet Take 1 tablet (150 mg total) by mouth 2 (two) times daily. For mood swings 60 tablet 1   oxyCODONE-acetaminophen (PERCOCET) 10-325 MG tablet Take 1 tablet by mouth every 4 (four) hours as needed for pain. 30 tablet 0   propranolol (INDERAL) 10 MG tablet TAKE 1 TABLET (10 MG TOTAL) BY MOUTH 3 (THREE) TIMES DAILY AS NEEDED. FOR ANXIETY ATTACKS 270 tablet 0   rizatriptan (MAXALT-MLT) 10 MG disintegrating tablet Take  1 tablet (10 mg total) by mouth as needed for migraine. May repeat in 2 hours if needed, that is max dose for 24 hours. 10 tablet 3   rosuvastatin (CRESTOR) 40 MG tablet TAKE 1 TABLET BY MOUTH EVERY DAY 90 tablet 0   tiZANidine (ZANAFLEX) 4 MG tablet Take 1 tablet (4 mg total) by mouth 4 (four) times daily. 360 tablet 1   traZODone (DESYREL) 100 MG tablet Take 1-1.5 tablets (100-150 mg total) by mouth at bedtime as needed for sleep. 135 tablet 0   valACYclovir (VALTREX) 1000 MG tablet TAKE 1 TABLET (1,000 MG TOTAL) BY MOUTH DAILY. FOR 5-7 DAYS, MAY REPEAT IF NEED FOR COLD SORE 90 tablet 1   Zoledronic Acid (ZOMETA) 4 MG/100ML IVPB Once per year from Orthopedic 100 mL    furosemide (LASIX) 20 MG tablet Take 1 tablet (20 mg total) by mouth daily as needed for fluid or edema. (Patient not taking: Reported on 02/20/2022) 90 tablet 1   hydrOXYzine (ATARAX) 25 MG tablet Take 1 tablet (25 mg total) by mouth every 8 (eight) hours as  needed for anxiety. 270 tablet 1   Lifitegrast (XIIDRA) 5 % SOLN Apply 1 drop to eye 2 (two) times daily as needed. Each eye (Patient not taking: Reported on 02/20/2022)     venlafaxine XR (EFFEXOR-XR) 75 MG 24 hr capsule Take 1 capsule (75 mg total) by mouth daily with breakfast. 90 capsule 1   Vibegron (GEMTESA) 75 MG TABS Take 1 tablet (75 mg total) by mouth daily. (Patient not taking: Reported on 02/20/2022) 42 tablet 0   No current facility-administered medications for this visit.     Musculoskeletal: Strength & Muscle Tone:  UTA Gait & Station:  Seated Patient leans: N/A  Psychiatric Specialty Exam: Review of Systems  Musculoskeletal:  Positive for back pain.  Psychiatric/Behavioral:  Positive for dysphoric mood and sleep disturbance. The patient is nervous/anxious.   All other systems reviewed and are negative.   There were no vitals taken for this visit.There is no height or weight on file to calculate BMI.  General Appearance: Casual  Eye Contact:  Fair  Speech:  Normal Rate  Volume:  Normal  Mood:  Anxious and Depressed , mood swings  Affect:  Congruent  Thought Process:  Goal Directed and Descriptions of Associations: Intact  Orientation:  Full (Time, Place, and Person)  Thought Content: Logical   Suicidal Thoughts:  No  Homicidal Thoughts:  No  Memory:  Immediate;   Fair Recent;   Fair Remote;   Fair  Judgement:  Fair  Insight:  Fair  Psychomotor Activity:  Normal  Concentration:  Concentration: Fair and Attention Span: Fair  Recall:  [employer] of Knowledge: Fair  Language: Fair  Akathisia:  No  Handed:  Right  AIMS (if indicated): not done  Assets:  Communication Skills Desire for Improvement Housing Social Support  ADL's:  Intact  Cognition: WNL  Sleep:  Poor   Screenings: AIMS    Flowsheet Row Video Visit from 06/13/2021 in [place] [place] Video Visit from 05/16/2021 in [facility] Total Score 0 0      GAD-7    Flowsheet Row Video Visit from 01/15/2022 in [place] Office Visit from 12/27/2021 in [facility] Office Visit from 11/09/2021 in [facility] Video Visit from 10/24/2021 in [facility]  Visit from 09/12/2021 in [facility]  Total GAD-7 Score 9 0 0 5 1      PHQ2-9    Flowsheet Row Video Visit from 01/15/2022 in [place] Office Visit from 12/27/2021 in [facility] Office Visit from 11/09/2021 in [facility] Video Visit from 10/24/2021 in [place] Office Visit from 09/12/2021 in [facility]  PHQ-2 Total Score 2 1 3 $ 0 1  PHQ-9 Total Score 6 2 3 4 3      $ Flowsheet Row Video Visit from 02/20/2022 in [place] Video Visit from 01/15/2022 in [facility] Admission (Discharged) from 12/18/2021 in [facility] No Risk No Risk No Risk        Assessment and Plan: Ashley Fields is a 67 year old Caucasian female who has a history of PTSD, bipolar disorder, panic disorder, migraine headaches, chronic pain was evaluated by telemedicine today.  Patient is currently struggling with multiple situational stressors, chronic pain, recent spinal fracture, currently with mood swings, sleep problems, will benefit from the following plan.  Plan Bipolar disorder type I mixed moderate-unstable Continue gabapentin 600 mg 4 times a day which is prescribed by her primary care provider for her pain.  However it is also a mood stabilizer.  However currently it is not helpful. Patient has tried and failed  atypical antipsychotics previously including risperidone, Abilify, not interested in that class of medication at this time. Will start Trileptal 150 mg p.o. twice daily Will recommend starting an atypical antipsychotic if this medication does not help.  PTSD-stable Venlafaxine 75 mg p.o. daily  Panic disorder-unstable Will refer patient for CBT-provided information for [employer]. Continue propranolol 10 mg p.o. 3 times daily as needed Increase hydroxyzine to 25 mg 3 times a day as needed.  Insomnia-unstable Patient to work on sleep hygiene Trazodone 100-150 mg p.o. nightly as needed She will need sufficient pain management  Follow-up in clinic in 4 weeks or sooner if needed. Collaboration of Care: Collaboration of Care: Referral or follow-up with counselor/therapist AEB encouraged to establish care with therapist.  Patient/Guardian was advised Release of Information must be obtained prior to any record release in order to collaborate their care with an outside provider. Patient/Guardian was advised if they have not already done so to contact the registration department to sign all necessary forms in order for [place] to release information regarding their care.   Consent: Patient/Guardian gives verbal consent for treatment and assignment of benefits for services provided during this visit. Patient/Guardian expressed understanding and agreed to proceed.   This note was generated in part or whole with voice recognition software. Voice recognition is usually quite accurate but there are transcription errors that can and very often do occur. I apologize for any typographical errors that were not detected and corrected.      Ursula Alert, MD 02/21/2022, 2:23 PM

## 2022-02-22 DIAGNOSIS — S3210XD Unspecified fracture of sacrum, subsequent encounter for fracture with routine healing: Secondary | ICD-10-CM | POA: Diagnosis not present

## 2022-02-26 ENCOUNTER — Ambulatory Visit: Payer: Medicare HMO | Admitting: Urology

## 2022-02-28 ENCOUNTER — Ambulatory Visit
Admission: RE | Admit: 2022-02-28 | Discharge: 2022-02-28 | Disposition: A | Discharge status: Home / Self Care | Payer: Medicare HMO | Source: Ambulatory Visit | Attending: Medical | Admitting: Medical

## 2022-02-28 DIAGNOSIS — I25118 Atherosclerotic heart disease of native coronary artery with other forms of angina pectoris: Secondary | ICD-10-CM

## 2022-02-28 DIAGNOSIS — I517 Cardiomegaly: Secondary | ICD-10-CM | POA: Diagnosis not present

## 2022-02-28 DIAGNOSIS — I2510 Atherosclerotic heart disease of native coronary artery without angina pectoris: Secondary | ICD-10-CM | POA: Insufficient documentation

## 2022-02-28 DIAGNOSIS — E785 Hyperlipidemia, unspecified: Secondary | ICD-10-CM | POA: Diagnosis not present

## 2022-02-28 DIAGNOSIS — D649 Anemia, unspecified: Secondary | ICD-10-CM | POA: Insufficient documentation

## 2022-02-28 LAB — ECHOCARDIOGRAM COMPLETE: S' Lateral: 2.1 cm

## 2022-02-28 NOTE — Progress Notes (Signed)
*  PRELIMINARY RESULTS* Echocardiogram 2D Echocardiogram has been performed.  Ashley Fields 02/28/2022, 9:47 AM

## 2022-03-04 ENCOUNTER — Encounter: Payer: Self-pay | Admitting: Family Medicine

## 2022-03-06 ENCOUNTER — Other Ambulatory Visit: Payer: Self-pay

## 2022-03-06 DIAGNOSIS — D508 Other iron deficiency anemias: Secondary | ICD-10-CM

## 2022-03-06 DIAGNOSIS — E538 Deficiency of other specified B group vitamins: Secondary | ICD-10-CM

## 2022-03-07 ENCOUNTER — Inpatient Hospital Stay: Payer: Medicare HMO | Attending: Internal Medicine

## 2022-03-07 DIAGNOSIS — Z79899 Other long term (current) drug therapy: Secondary | ICD-10-CM | POA: Diagnosis not present

## 2022-03-07 DIAGNOSIS — E538 Deficiency of other specified B group vitamins: Secondary | ICD-10-CM

## 2022-03-07 DIAGNOSIS — D508 Other iron deficiency anemias: Secondary | ICD-10-CM | POA: Insufficient documentation

## 2022-03-07 LAB — IRON AND TIBC
Iron: 77 ug/dL (ref 28–170)
Saturation Ratios: 19 % (ref 10.4–31.8)
TIBC: 417 ug/dL (ref 250–450)
UIBC: 340 ug/dL

## 2022-03-07 LAB — CBC WITH DIFFERENTIAL (CANCER CENTER ONLY)
Abs Immature Granulocytes: 0.02 10*3/uL (ref 0.00–0.07)
Basophils Absolute: 0 10*3/uL (ref 0.0–0.1)
Basophils Relative: 1 %
Eosinophils Absolute: 0.3 10*3/uL (ref 0.0–0.5)
Eosinophils Relative: 5 %
HCT: 39.5 % (ref 36.0–46.0)
Hemoglobin: 12.1 g/dL (ref 12.0–15.0)
Immature Granulocytes: 0 %
Lymphocytes Relative: 27 %
Lymphs Abs: 1.7 10*3/uL (ref 0.7–4.0)
MCH: 27.9 pg (ref 26.0–34.0)
MCHC: 30.6 g/dL (ref 30.0–36.0)
MCV: 91.2 fL (ref 80.0–100.0)
Monocytes Absolute: 0.4 10*3/uL (ref 0.1–1.0)
Monocytes Relative: 6 %
Neutro Abs: 3.8 10*3/uL (ref 1.7–7.7)
Neutrophils Relative %: 61 %
Platelet Count: 179 10*3/uL (ref 150–400)
RBC: 4.33 MIL/uL (ref 3.87–5.11)
RDW: 13.4 % (ref 11.5–15.5)
WBC Count: 6.2 10*3/uL (ref 4.0–10.5)
nRBC: 0 % (ref 0.0–0.2)

## 2022-03-07 LAB — FERRITIN: Ferritin: 25 ng/mL (ref 11–307)

## 2022-03-07 LAB — BASIC METABOLIC PANEL - CANCER CENTER ONLY
Anion gap: 9 (ref 5–15)
BUN: 18 mg/dL (ref 8–23)
CO2: 26 mmol/L (ref 22–32)
Calcium: 8.8 mg/dL — ABNORMAL LOW (ref 8.9–10.3)
Chloride: 103 mmol/L (ref 98–111)
Creatinine: 0.75 mg/dL (ref 0.44–1.00)
GFR, Estimated: >60 mL/min
Glucose, Bld: 136 mg/dL — ABNORMAL HIGH (ref 70–99)
Potassium: 4.1 mmol/L (ref 3.5–5.1)
Sodium: 138 mmol/L (ref 135–145)

## 2022-03-07 LAB — VITAMIN B12: Vitamin B-12: >7500 pg/mL — ABNORMAL HIGH (ref 180–914)

## 2022-03-07 LAB — VITAMIN D 25 HYDROXY (VIT D DEFICIENCY, FRACTURES): Vit D, 25-Hydroxy: 93.33 ng/mL (ref 30–100)

## 2022-03-08 MED FILL — Iron Sucrose Inj 20 MG/ML (Fe Equiv): INTRAVENOUS | Qty: 10 | Status: AC

## 2022-03-09 ENCOUNTER — Inpatient Hospital Stay: Payer: Medicare HMO | Admitting: Internal Medicine

## 2022-03-09 ENCOUNTER — Inpatient Hospital Stay: Payer: Medicare HMO

## 2022-03-09 ENCOUNTER — Telehealth: Payer: Self-pay | Admitting: *Deleted

## 2022-03-09 DIAGNOSIS — R002 Palpitations: Secondary | ICD-10-CM | POA: Diagnosis not present

## 2022-03-09 NOTE — Progress Notes (Deleted)
[place] NOTE  Patient Care Team: Olin Hauser, DO as PCP - General (Family Medicine)  CHIEF COMPLAINTS/PURPOSE OF CONSULTATION: Iron deficiency   HEMATOLOGY HISTORY  # ANEMIA IRON DEFICIENCY GASTRIC BYPASS [2005 [place], [place]] EGD-none; colonoscopy-2015- few polyps snared [[place]]; IV venofer x3; fall 2020/ PO B12 [intol to SL b12]    Latest Reference Range & Units 03/03/21 10:15  Iron 28 - 170 ug/dL 122  UIBC ug/dL 385  TIBC 250 - 450 ug/dL 507 (H)  Saturation Ratios 10.4 - 31.8 % 24  Ferritin 11 - 307 ng/mL 15  Vitamin B12 180 - 914 pg/mL 573  (H): Data is abnormally high  HISTORY OF PRESENTING ILLNESS:  Ashley Fields 67 y.o.  female history of iron deficiency secondary to gastric bypass is here for follow-up.   Complains of mild to moderate fatigue.  Otherwise no blood in stools or black or stools.   Review of Systems  Constitutional:  Positive for malaise/fatigue. Negative for chills, diaphoresis, fever and weight loss.  HENT:  Negative for nosebleeds and sore throat.   Eyes:  Negative for double vision.  Respiratory:  Negative for cough, hemoptysis, sputum production and wheezing.   Cardiovascular:  Negative for chest pain, palpitations, orthopnea and leg swelling.  Gastrointestinal:  Negative for abdominal pain, blood in stool, constipation, diarrhea, heartburn, melena, nausea and vomiting.  Genitourinary:  Negative for dysuria, frequency and urgency.  Musculoskeletal:  Negative for back pain and joint pain.  Skin: Negative.  Negative for itching and rash.  Neurological:  Negative for dizziness, tingling, focal weakness, weakness and headaches.  Endo/Heme/Allergies:  Does not bruise/bleed easily.  Psychiatric/Behavioral:  Negative for depression. The patient is not nervous/anxious and does not have insomnia.     MEDICAL HISTORY:  Past Medical History:  Diagnosis Date   Allergy    Anemia    Anxiety    Arthritis    Yes   Back  pain    Blood transfusion without reported diagnosis 1977   Transfusions from miscarriage & hemorrhaging   Chronic kidney disease    Depression    Disp fx of cuboid bone of right foot, init for clos fx 01/01/2017   Fibromyalgia    GERD (gastroesophageal reflux disease)    Headache    Hepatitis C    Hyperlipidemia    Lumbar radiculopathy    Neck pain    Neuromuscular disorder ([place]) 1997   Falling to much was an eye-opener   Osteoporosis    Thyroid disease    Urine incontinence     SURGICAL HISTORY: Past Surgical History:  Procedure Laterality Date   5 miscarriages     1977-1985, Blood transfusion s/p miscarriage 1977   ABDOMINAL EXPOSURE N/A 12/18/2021   Procedure: ABDOMINAL EXPOSURE;  Surgeon: Marty Heck, MD;  Location: [facility];  Service: Vascular;  Laterality: N/A;   ABDOMINAL HYSTERECTOMY  1987   Total   ANTERIOR LUMBAR FUSION N/A 12/18/2021   Procedure: Anterior Lumbar Interbody Fusion  - Lumbar Five-Sacral One;  Surgeon: Eustace Moore, MD;  Location: [facility];  Service: Neurosurgery;  Laterality: N/A;   APPENDECTOMY  12/2008   AUGMENTATION MAMMAPLASTY Bilateral 2015   Bilat   AUGMENTATION MAMMAPLASTY Bilateral 2018   implants redone w/ placement of implants under muscle   breast lift bilateral, implants  0000000   bilateral, silicon naturel   BREAST REDUCTION SURGERY Bilateral 1997   CESAREAN SECTION  07/27/1981   Placenta Previa   CHOLECYSTECTOMY  03/2004  Lap surgery   COLONOSCOPY WITH PROPOFOL N/A 06/13/2020   Procedure: COLONOSCOPY WITH PROPOFOL;  Surgeon: Virgel Manifold, MD;  Location: [facility];  Service: Endoscopy;  Laterality: N/A;   COSMETIC SURGERY     Breast implants w/ lift, tummy tuck, upper & lower Blepharop   ESOPHAGOGASTRODUODENOSCOPY (EGD) WITH PROPOFOL N/A 06/13/2020   Procedure: ESOPHAGOGASTRODUODENOSCOPY (EGD) WITH PROPOFOL;  Surgeon: Virgel Manifold, MD;  Location: [facility];  Service: Endoscopy;  Laterality: N/A;    EYE SURGERY  2017   Cataract surgery & lasik   GASTRIC BYPASS  2005   Laparoscopic   IVC FILTER INSERTION  12/2003   TrapEase Vena Cava Filter   LUMBAR LAMINECTOMY  06/2006   L4-L5 (spinal fusion)   mini tummy tuck  05/07/2016   Bilateral bra/back roll lift skin removal   neck fusion     c3-c7 cadavier bones and metal plates placed per patient   neck surgery C3-C7  02/10/2015   ACDF   PROSTATE SURGERY     REDUCTION MAMMAPLASTY Bilateral 1997   SHOULDER ACROMIOPLASTY Left 03/27/2013   w/ labral debridement   SMALL INTESTINE SURGERY  12/24/2003   Lap Gastric Bypass   SPINAL CORD STIMULATOR IMPLANT  11/2010   removed 05/2011   SPINE SURGERY  2008 2017   Fusion L4-L5, ACDF C4-C7    SOCIAL HISTORY: Social History   Socioeconomic History   Marital status: Married    Spouse name: Not on file   Number of children: 1   Years of education: High School   Highest education level: Some college, no degree  Occupational History   Occupation: disability  Tobacco Use   Smoking status: Former    Packs/day: 0.25    Years: 10.00    Total pack years: 2.50    Types: Cigarettes    Quit date: 11/29/1995    Years since quitting: 26.2    Passive exposure: Past   Smokeless tobacco: Former   Tobacco comments:    Quite 1997, didnt smoke hardly at all when I was smoking.  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]   Social Determinants of Health   Financial Resource Strain: [place]  (04/06/2021)   Overall Financial Resource Strain (CARDIA)    Difficulty of Paying Living Expenses: Not very hard  Food Insecurity: No Food  Insecurity (12/21/2021)   Hunger Vital Sign    Worried About Running Out of Food in the Last Year: Never true    Ran Out of Food in the Last Year: Never true  Transportation Needs: No Transportation Needs (12/21/2021)   PRAPARE - Hydrologist (Medical): No    Lack of Transportation (Non-Medical): No  Physical Activity: Insufficiently Active (04/06/2021)   Exercise Vital Sign    Days of Exercise per Week: 2 days    Minutes of Exercise per Session: 10 min  Stress: Stress Concern Present (04/06/2021)   [place]    Feeling of Stress : Very much  Social Connections: Moderately Isolated (04/06/2021)   Social Connection and Isolation Panel [  NHANES]    Frequency of Communication with Friends and Family: More than three times a week    Frequency of Social Gatherings with Friends and Family: More than three times a week    Attends Religious Services: Never    Marine scientist or Organizations: No    Attends Archivist Meetings: Never    Marital Status: Married  Human resources officer Violence: Not At Risk (04/06/2021)   Humiliation, Afraid, Rape, and Kick questionnaire    Fear of Current or Ex-Partner: No    Emotionally Abused: No    Physically Abused: No    Sexually Abused: No    FAMILY HISTORY: Family History  Problem Relation Age of Onset   COPD Mother    Lung cancer Mother    Diabetes Mother    Heart disease Mother    Stroke Mother    Heart attack Mother    Arthritis Mother    Cancer Mother    Hypertension Mother    Obesity Mother    Varicose Veins Mother    Heart disease Father    Heart attack Father 51   Early death Father    Depression Sister        x 3 sisters   COPD Sister    Hypertension Sister    Heart disease Brother        x 3 brothers   Diabetes Brother    Colon polyps Sister    Hearing loss Sister    Heart attack Sister    Heart attack Brother     Arthritis Sister    Varicose Veins Sister    Arthritis Brother    Heart disease Brother    Hypertension Brother    Cancer Maternal Grandfather    Stroke Maternal Grandfather    COPD Sister    Obesity Sister    Depression Sister    Depression Sister    Heart disease Sister    Hypertension Sister    Diabetes Brother    Heart disease Brother    Hypertension Brother    Obesity Brother    Varicose Veins Brother    Heart disease Brother    Hypertension Brother    Obesity Brother    Hypertension Sister    Obesity Sister    Miscarriages / Stillbirths Maternal Aunt    Miscarriages / Stillbirths Paternal Aunt    Breast cancer Neg Hx     ALLERGIES:  is allergic to flagyl [metronidazole], cymbalta [duloxetine hcl], and tape.  MEDICATIONS:  Current Outpatient Medications  Medication Sig Dispense Refill   aspirin EC (ASPIRIN LOW DOSE) 81 MG tablet TAKE 1 TABLET BY MOUTH EVERY DAY 90 tablet 0   bisacodyl (DULCOLAX) 5 MG EC tablet Take 5 mg by mouth daily as needed for moderate constipation.     butalbital-acetaminophen-caffeine (FIORICET) 50-325-40 MG tablet Take 1 tablet by mouth 2 (two) times daily as needed for headache.     Cholecalciferol 250 MCG (10000 UT) CAPS Take 10,000 Units by mouth once a week.     cyanocobalamin (VITAMIN B12) 1000 MCG/ML injection Inject 1 mL (1,000 mcg total) into the muscle every 30 (thirty) days. 1 mL 11   diclofenac Sodium (VOLTAREN) 1 % GEL Apply 2 g topically 4 (four) times daily as needed (pain).     estradiol (ESTRACE) 1 MG tablet TAKE 1 TABLET EVERY DAY (Patient taking differently: Take 1 mg by mouth daily.) 90 tablet 1   etodolac (LODINE) 500 MG tablet Take 1 tablet  by mouth 2 (two) times daily.     ezetimibe (ZETIA) 10 MG tablet Take 1 tablet (10 mg total) by mouth daily. 90 tablet 3   fluticasone (FLONASE) 50 MCG/ACT nasal spray SPRAY 2 SPRAYS INTO EACH NOSTRIL EVERY DAY 48 mL 0   furosemide (LASIX) 20 MG tablet Take 1 tablet (20 mg total) by  mouth daily as needed for fluid or edema. (Patient not taking: Reported on 02/20/2022) 90 tablet 1   gabapentin (NEURONTIN) 600 MG tablet Take 1 tablet (600 mg total) by mouth in the morning, at noon, in the evening, and at bedtime. 360 tablet 1   hydrOXYzine (ATARAX) 25 MG tablet Take 1 tablet (25 mg total) by mouth every 8 (eight) hours as needed for anxiety. 270 tablet 1   Lifitegrast (XIIDRA) 5 % SOLN Apply 1 drop to eye 2 (two) times daily as needed. Each eye (Patient not taking: Reported on 02/20/2022)     omeprazole (PRILOSEC) 40 MG capsule Take 1 capsule (40 mg total) by mouth daily. 90 capsule 0   ondansetron (ZOFRAN ODT) 4 MG disintegrating tablet Take 1 tablet (4 mg total) by mouth every 8 (eight) hours as needed for nausea or vomiting. 30 tablet 2   OXcarbazepine (TRILEPTAL) 150 MG tablet Take 1 tablet (150 mg total) by mouth 2 (two) times daily. For mood swings 60 tablet 1   oxyCODONE-acetaminophen (PERCOCET) 10-325 MG tablet Take 1 tablet by mouth every 4 (four) hours as needed for pain. 30 tablet 0   propranolol (INDERAL) 10 MG tablet TAKE 1 TABLET (10 MG TOTAL) BY MOUTH 3 (THREE) TIMES DAILY AS NEEDED. FOR ANXIETY ATTACKS 270 tablet 0   rizatriptan (MAXALT-MLT) 10 MG disintegrating tablet Take 1 tablet (10 mg total) by mouth as needed for migraine. May repeat in 2 hours if needed, that is max dose for 24 hours. 10 tablet 3   rosuvastatin (CRESTOR) 40 MG tablet TAKE 1 TABLET BY MOUTH EVERY DAY 90 tablet 0   tiZANidine (ZANAFLEX) 4 MG tablet Take 1 tablet (4 mg total) by mouth 4 (four) times daily. 360 tablet 1   traZODone (DESYREL) 100 MG tablet Take 1-1.5 tablets (100-150 mg total) by mouth at bedtime as needed for sleep. 135 tablet 0   valACYclovir (VALTREX) 1000 MG tablet TAKE 1 TABLET (1,000 MG TOTAL) BY MOUTH DAILY. FOR 5-7 DAYS, MAY REPEAT IF NEED FOR COLD SORE 90 tablet 1   venlafaxine XR (EFFEXOR-XR) 75 MG 24 hr capsule Take 1 capsule (75 mg total) by mouth daily with breakfast. 90  capsule 1   Vibegron (GEMTESA) 75 MG TABS Take 1 tablet (75 mg total) by mouth daily. (Patient not taking: Reported on 02/20/2022) 42 tablet 0   Zoledronic Acid (ZOMETA) 4 MG/100ML IVPB Once per year from Orthopedic 100 mL    No current facility-administered medications for this visit.      PHYSICAL EXAMINATION:   There were no vitals filed for this visit.  There were no vitals filed for this visit.   Physical Exam HENT:     Head: Normocephalic and atraumatic.     Mouth/Throat:     Pharynx: No oropharyngeal exudate.  Eyes:     Pupils: Pupils are equal, round, and reactive to light.  Cardiovascular:     Rate and Rhythm: Normal rate and regular rhythm.  Pulmonary:     Effort: Pulmonary effort is normal. No respiratory distress.     Breath sounds: Normal breath sounds. No wheezing.  Abdominal:  General: Bowel sounds are normal. There is no distension.     Palpations: Abdomen is soft. There is no mass.     Tenderness: There is no abdominal tenderness. There is no guarding or rebound.  Musculoskeletal:        General: No tenderness. Normal range of motion.     Cervical back: Normal range of motion and neck supple.  Skin:    General: Skin is warm.  Neurological:     Mental Status: She is alert and oriented to person, place, and time.  Psychiatric:        Mood and Affect: Affect normal.     LABORATORY DATA:  I have reviewed the data as listed Lab Results  Component Value Date   WBC 6.2 03/07/2022   HGB 12.1 03/07/2022   HCT 39.5 03/07/2022   MCV 91.2 03/07/2022   PLT 179 03/07/2022   Recent Labs    04/28/21 0824 09/01/21 0813 11/15/21 1110 12/27/21 0915 03/07/22 1349  NA 142 139 143 142 138  K 4.3 4.0 4.2 4.6 4.1  CL 104 104 110 107 103  CO2 '30 29 30 28 26  '$ GLUCOSE 82 91 94 114* 136*  BUN '19 16 16 13 18  '$ CREATININE 1.07* 0.81 0.69 0.59 0.75  CALCIUM 9.1 9.0 9.0 8.7 8.8*  GFRNONAA  --  >60 >60  --  >60  PROT 6.4  --  7.2  --   --   ALBUMIN  --   --   4.1  --   --   AST 31  --  65*  --   --   ALT 25  --  58*  --   --   ALKPHOS  --   --  34*  --   --   BILITOT 0.8  --  0.6  --   --       ECHOCARDIOGRAM COMPLETE  Result Date: 02/28/2022    ECHOCARDIOGRAM REPORT   Patient Name:   Ashley Fields Date of Exam: 02/28/2022 Medical Rec #:  [id number]   Height:       62.0 in Accession #:    [id number]  Weight:       195.0 lb Date of Birth:  07/09/1955   BSA:          1.891 m Patient Age:    67 years    BP:           142/84 mmHg Patient Gender: F           HR:           79 bpm. Exam Location:  [facility] Procedure: 2D Echo, Cardiac Doppler and Color Doppler Indications:     CAD native vessel I25.10  History:         Patient has no prior history of Echocardiogram examinations.                  Risk Factors:Dyslipidemia. Anemia.  Sonographer:     Sherrie Sport Referring Phys:  [id number] [place] Diagnosing Phys: Kate Sable MD  Sonographer Comments: Image acquisition challenging due to breast implants. IMPRESSIONS  1. Left ventricular ejection fraction, by estimation, is 60 to 65%. The left ventricle has normal function. The left ventricle has no regional wall motion abnormalities. There is mild left ventricular hypertrophy. Left ventricular diastolic function could not be evaluated.  2. Right ventricular systolic function is normal. The right ventricular size is normal.  3. The mitral valve is normal in  structure. No evidence of mitral valve regurgitation.  4. The aortic valve was not well visualized. Aortic valve regurgitation is not visualized.  5. The inferior vena cava is normal in size with greater than 50% respiratory variability, suggesting right atrial pressure of 3 mmHg. FINDINGS  Left Ventricle: Left ventricular ejection fraction, by estimation, is 60 to 65%. The left ventricle has normal function. The left ventricle has no regional wall motion abnormalities. The left ventricular internal cavity size was normal in size. There is  mild left ventricular  hypertrophy. Left ventricular diastolic function could not be evaluated. Right Ventricle: The right ventricular size is normal. No increase in right ventricular wall thickness. Right ventricular systolic function is normal. Left Atrium: Left atrial size was normal in size. Right Atrium: Right atrial size was normal in size. Pericardium: There is no evidence of pericardial effusion. Mitral Valve: The mitral valve is normal in structure. No evidence of mitral valve regurgitation. Tricuspid Valve: The tricuspid valve is normal in structure. Tricuspid valve regurgitation is not demonstrated. Aortic Valve: The aortic valve was not well visualized. Aortic valve regurgitation is not visualized. Pulmonic Valve: The pulmonic valve was not well visualized. Pulmonic valve regurgitation is not visualized. Aorta: The aortic root is normal in size and structure. Venous: The inferior vena cava is normal in size with greater than 50% respiratory variability, suggesting right atrial pressure of 3 mmHg. IAS/Shunts: No atrial level shunt detected by color flow Doppler.  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  On IV iron.  Positive fatigue. STABLE.   # Proceed with Venofer today.  Hemoglobin 14; saturation 23%;Ferritin-15;  continue maintenance iron infusion.   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  Stable  # Mild intermittent  thrombocytopenia-178- normal.   # DISPOSITION:  #  VENOFER TODAY # follow up in 6 months MD- labs-1-2 days PRIOR cbc/bmp/b12/ iron studies/ferrtin; Vit D-OH levels- possible VENOFER- Dr.B  All questions were answered. The patient knows to call the clinic with any problems, questions or concerns.    Cammie Sickle, MD 03/09/2022 8:52 AM

## 2022-03-09 NOTE — Telephone Encounter (Signed)
Patient called reporting that she has an appointment for Iron today, but she has developed cough nasal congestion and low grade fever, she feels shortness of breath because she cannot breathe through her nose. Should she reschedule or can she come in wearing a mask. Please advise

## 2022-03-09 NOTE — Assessment & Plan Note (Deleted)
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  On IV iron.  Positive fatigue. STABLE.   # Proceed with Venofer today.  Hemoglobin 14; saturation 23%;Ferritin-15;  continue maintenance iron infusion.   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  Stable- Elevated vitamin D levels - I Sent a message to discontinue 50,000 units of vitamin D. Recommend 1000 units daily..  # Mild intermittent thrombocytopenia-178- normal.   # DISPOSITION:  #  VENOFER TODAY # follow up in 6 months MD- labs-1-2 days PRIOR cbc/bmp/b12/ iron studies/ferrtin; Vit D-OH levels- possible VENOFER- Dr.B

## 2022-03-14 ENCOUNTER — Other Ambulatory Visit: Payer: Self-pay | Admitting: Cardiovascular Disease

## 2022-03-14 ENCOUNTER — Other Ambulatory Visit: Payer: Self-pay | Admitting: Psychiatry

## 2022-03-14 DIAGNOSIS — F4310 Post-traumatic stress disorder, unspecified: Secondary | ICD-10-CM

## 2022-03-14 DIAGNOSIS — F3162 Bipolar disorder, current episode mixed, moderate: Secondary | ICD-10-CM

## 2022-03-15 MED FILL — Iron Sucrose Inj 20 MG/ML (Fe Equiv): INTRAVENOUS | Qty: 10 | Status: AC

## 2022-03-16 ENCOUNTER — Inpatient Hospital Stay: Payer: Medicare HMO

## 2022-03-16 ENCOUNTER — Encounter: Payer: Self-pay | Admitting: Medical Oncology

## 2022-03-16 ENCOUNTER — Inpatient Hospital Stay: Payer: Medicare HMO | Attending: Internal Medicine | Admitting: Medical Oncology

## 2022-03-16 VITALS — BP 158/83 | HR 65 | Temp 96.5°F | Resp 16 | Ht 62.0 in | Wt 206.0 lb

## 2022-03-16 DIAGNOSIS — Z9884 Bariatric surgery status: Secondary | ICD-10-CM | POA: Insufficient documentation

## 2022-03-16 DIAGNOSIS — E538 Deficiency of other specified B group vitamins: Secondary | ICD-10-CM | POA: Insufficient documentation

## 2022-03-16 DIAGNOSIS — E559 Vitamin D deficiency, unspecified: Secondary | ICD-10-CM | POA: Diagnosis not present

## 2022-03-16 DIAGNOSIS — R7989 Other specified abnormal findings of blood chemistry: Secondary | ICD-10-CM

## 2022-03-16 DIAGNOSIS — D508 Other iron deficiency anemias: Secondary | ICD-10-CM | POA: Diagnosis not present

## 2022-03-16 MED ORDER — SODIUM CHLORIDE 0.9 % IV SOLN
200.0000 mg | Freq: Once | INTRAVENOUS | Status: AC
  Administered 2022-03-16: 200 mg via INTRAVENOUS
  Filled 2022-03-16: qty 200

## 2022-03-16 MED ORDER — SODIUM CHLORIDE 0.9 % IV SOLN
Freq: Once | INTRAVENOUS | Status: AC
  Filled 2022-03-16: qty 250

## 2022-03-16 NOTE — Progress Notes (Signed)
[facility] NOTE  Patient Care Team: Olin Hauser, DO as PCP - General (Family Medicine)  CHIEF COMPLAINTS/PURPOSE OF CONSULTATION: Iron deficiency   HEMATOLOGY HISTORY  # ANEMIA IRON DEFICIENCY GASTRIC BYPASS [2005 [place]] EGD-none; colonoscopy-2015- few polyps snared [[place]]; IV venofer x3; fall 2020/ PO B12 [intol to SL b12]  Latest Reference Range & Units 03/03/21 10:15  Iron 28 - 170 ug/dL 122  UIBC ug/dL 385  TIBC 250 - 450 ug/dL 507 (H)  Saturation Ratios 10.4 - 31.8 % 24  Ferritin 11 - 307 ng/mL 15  Vitamin B12 180 - 914 pg/mL 573  (H): Data is abnormally high  HISTORY OF PRESENTING ILLNESS:  Ashley Fields 67 y.o.  female history of iron deficiency secondary to gastric bypass is here for follow-up.  She reports that she continues to have fatigue. She attributes this to back surgery she had a few months ago. She continues to complete her monthly B12 injections at home. Continues to take vitamin D 5,000 IU weekly OTC. She denies any bleeding episodes.    Review of Systems  Constitutional:  Positive for malaise/fatigue. Negative for chills, diaphoresis, fever and weight loss.  HENT:  Negative for nosebleeds and sore throat.   Eyes:  Negative for double vision.  Respiratory:  Negative for cough, hemoptysis, sputum production and wheezing.   Cardiovascular:  Negative for chest pain, palpitations, orthopnea and leg swelling.  Gastrointestinal:  Negative for abdominal pain, blood in stool, constipation, diarrhea, heartburn, melena, nausea and vomiting.  Genitourinary:  Negative for dysuria, frequency and urgency.  Musculoskeletal:  Negative for back pain and joint pain.  Skin: Negative.  Negative for itching and rash.  Neurological:  Negative for dizziness, tingling, focal weakness, weakness and headaches.  Endo/Heme/Allergies:  Does not bruise/bleed easily.  Psychiatric/Behavioral:  Negative for depression. The patient is not  nervous/anxious and does not have insomnia.     MEDICAL HISTORY:  Past Medical History:  Diagnosis Date   Allergy    Anemia    Anxiety    Arthritis    Yes   Back pain    Blood transfusion without reported diagnosis 1977   Transfusions from miscarriage & hemorrhaging   Chronic kidney disease    Depression    Disp fx of cuboid bone of right foot, init for clos fx 01/01/2017   Fibromyalgia    GERD (gastroesophageal reflux disease)    Headache    Hepatitis C    Hyperlipidemia    Lumbar radiculopathy    Neck pain    Neuromuscular disorder ([place]) 1997   Falling to much was an eye-opener   Osteoporosis    Thyroid disease    Urine incontinence     SURGICAL HISTORY: Past Surgical History:  Procedure Laterality Date   5 miscarriages     1977-1985, Blood transfusion s/p miscarriage 1977   ABDOMINAL EXPOSURE N/A 12/18/2021   Procedure: ABDOMINAL EXPOSURE;  Surgeon: Marty Heck, MD;  Location: [facility];  Service: Vascular;  Laterality: N/A;   ABDOMINAL HYSTERECTOMY  1987   Total   ANTERIOR LUMBAR FUSION N/A 12/18/2021   Procedure: Anterior Lumbar Interbody Fusion  - Lumbar Five-Sacral One;  Surgeon: Eustace Moore, MD;  Location: [facility];  Service: Neurosurgery;  Laterality: N/A;   APPENDECTOMY  12/2008   AUGMENTATION MAMMAPLASTY Bilateral 2015   Bilat   AUGMENTATION MAMMAPLASTY Bilateral 2018   implants redone w/ placement of implants under muscle   breast lift bilateral, implants  05/18/2015  bilateral, silicon naturel   BREAST REDUCTION SURGERY Bilateral 1997   CESAREAN SECTION  07/27/1981   Placenta Previa   CHOLECYSTECTOMY  03/2004   Lap surgery   COLONOSCOPY WITH PROPOFOL N/A 06/13/2020   Procedure: COLONOSCOPY WITH PROPOFOL;  Surgeon: Virgel Manifold, MD;  Location: [facility];  Service: Endoscopy;  Laterality: N/A;   COSMETIC SURGERY     Breast implants w/ lift, tummy tuck, upper & lower Blepharop   ESOPHAGOGASTRODUODENOSCOPY (EGD) WITH PROPOFOL N/A  06/13/2020   Procedure: ESOPHAGOGASTRODUODENOSCOPY (EGD) WITH PROPOFOL;  Surgeon: Virgel Manifold, MD;  Location: [facility];  Service: Endoscopy;  Laterality: N/A;   EYE SURGERY  2017   Cataract surgery & lasik   GASTRIC BYPASS  2005   Laparoscopic   IVC FILTER INSERTION  12/2003   TrapEase Vena Cava Filter   LUMBAR LAMINECTOMY  06/2006   L4-L5 (spinal fusion)   mini tummy tuck  05/07/2016   Bilateral bra/back roll lift skin removal   neck fusion     c3-c7 cadavier bones and metal plates placed per patient   neck surgery C3-C7  02/10/2015   ACDF   PROSTATE SURGERY     REDUCTION MAMMAPLASTY Bilateral 1997   SHOULDER ACROMIOPLASTY Left 03/27/2013   w/ labral debridement   SMALL INTESTINE SURGERY  12/24/2003   Lap Gastric Bypass   SPINAL CORD STIMULATOR IMPLANT  11/2010   removed 05/2011   SPINE SURGERY  2008 2017   Fusion L4-L5, ACDF C4-C7    SOCIAL HISTORY: Social History   Socioeconomic History   Marital status: Married    Spouse name: Not on file   Number of children: 1   Years of education: High School   Highest education level: Some college, no degree  Occupational History   Occupation: disability  Tobacco Use   Smoking status: Former    Packs/day: 0.25    Years: 10.00    Total pack years: 2.50    Types: Cigarettes    Quit date: 11/29/1995    Years since quitting: 26.3    Passive exposure: Past   Smokeless tobacco: Former   Tobacco comments:    Quite 1997, didnt smoke hardly at all when I was smoking.  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]   Social Determinants of Health   Financial  Resource Strain: [place]  (04/06/2021)   Overall Financial Resource Strain (CARDIA)    Difficulty of Paying Living Expenses: Not very hard  Food Insecurity: No Food Insecurity (12/21/2021)   Hunger Vital Sign    Worried About Running Out of Food in the Last Year: Never true    Ran Out of Food in the Last Year: Never true  Transportation Needs: No Transportation Needs (12/21/2021)   PRAPARE - Hydrologist (Medical): No    Lack of Transportation (Non-Medical): No  Physical Activity: Insufficiently Active (04/06/2021)   Exercise Vital Sign    Days of Exercise per Week: 2 days    Minutes of Exercise per Session: 10 min  Stress: Stress Concern Present (04/06/2021)   [employer] - Occupational Stress Questionnaire    Feeling of Stress : Very much  Social Connections: Moderately Isolated (04/06/2021)   Social Connection and Isolation Panel [NHANES]    Frequency of Communication with Friends and Family: More than three times a week    Frequency of Social Gatherings with Friends and Family: More than three times a week    Attends Religious Services: Never    Marine scientist or Organizations: No    Attends Archivist Meetings: Never    Marital Status: Married  Human resources officer Violence: Not At Risk (04/06/2021)   Humiliation, Afraid, Rape, and Kick questionnaire    Fear of Current or Ex-Partner: No    Emotionally Abused: No    Physically Abused: No    Sexually Abused: No    FAMILY HISTORY: Family History  Problem Relation Age of Onset   COPD Mother    Lung cancer Mother    Diabetes Mother    Heart disease Mother    Stroke Mother    Heart attack Mother    Arthritis Mother    Cancer Mother    Hypertension Mother    Obesity Mother    Varicose Veins Mother    Heart disease Father    Heart attack Father 9   Early death Father    Depression Sister        x 45 sisters   COPD Sister    Hypertension Sister     Heart disease Brother        x 3 brothers   Diabetes Brother    Colon polyps Sister    Hearing loss Sister    Heart attack Sister    Heart attack Brother    Arthritis Sister    Varicose Veins Sister    Arthritis Brother    Heart disease Brother    Hypertension Brother    Cancer Maternal Grandfather    Stroke Maternal Grandfather    COPD Sister    Obesity Sister    Depression Sister    Depression Sister    Heart disease Sister    Hypertension Sister    Diabetes Brother    Heart disease Brother    Hypertension Brother    Obesity Brother    Varicose Veins Brother    Heart disease Brother    Hypertension Brother    Obesity Brother    Hypertension Sister    Obesity Sister    Miscarriages / Stillbirths Maternal Aunt    Miscarriages / Stillbirths Paternal Aunt    Breast cancer Neg Hx     ALLERGIES:  is allergic to flagyl [metronidazole], cymbalta [duloxetine hcl], and tape.  MEDICATIONS:  Current Outpatient Medications  Medication Sig Dispense Refill   aspirin EC (ASPIRIN LOW DOSE) 81 MG tablet TAKE 1 TABLET BY MOUTH EVERY DAY 90 tablet 0   bisacodyl (DULCOLAX) 5 MG EC tablet Take 5 mg by mouth daily as needed for moderate constipation.     butalbital-acetaminophen-caffeine (FIORICET) 50-325-40 MG tablet Take 1 tablet by mouth 2 (two) times daily as needed for headache.     Cholecalciferol 250 MCG (10000 UT) CAPS Take 10,000 Units by mouth once a week.     cyanocobalamin (VITAMIN B12) 1000 MCG/ML injection Inject 1 mL (1,000 mcg total) into the muscle every 30 (thirty) days. 1 mL 11   diclofenac Sodium (VOLTAREN) 1 % GEL Apply 2 g topically 4 (four) times daily as needed (pain).     estradiol (ESTRACE) 1 MG tablet  TAKE 1 TABLET EVERY DAY (Patient taking differently: Take 1 mg by mouth daily.) 90 tablet 1   etodolac (LODINE) 500 MG tablet Take 1 tablet by mouth 2 (two) times daily.     ezetimibe (ZETIA) 10 MG tablet Take 1 tablet (10 mg total) by mouth daily. 90 tablet 3    fluticasone (FLONASE) 50 MCG/ACT nasal spray SPRAY 2 SPRAYS INTO EACH NOSTRIL EVERY DAY 48 mL 0   gabapentin (NEURONTIN) 600 MG tablet Take 1 tablet (600 mg total) by mouth in the morning, at noon, in the evening, and at bedtime. 360 tablet 1   hydrOXYzine (ATARAX) 25 MG tablet Take 1 tablet (25 mg total) by mouth every 8 (eight) hours as needed for anxiety. 270 tablet 1   omeprazole (PRILOSEC) 40 MG capsule Take 1 capsule (40 mg total) by mouth daily. 90 capsule 0   ondansetron (ZOFRAN ODT) 4 MG disintegrating tablet Take 1 tablet (4 mg total) by mouth every 8 (eight) hours as needed for nausea or vomiting. 30 tablet 2   OXcarbazepine (TRILEPTAL) 150 MG tablet TAKE 1 TABLET (150 MG TOTAL) BY MOUTH 2 (TWO) TIMES DAILY. FOR MOOD SWINGS 180 tablet 0   oxyCODONE-acetaminophen (PERCOCET) 10-325 MG tablet Take 1 tablet by mouth every 4 (four) hours as needed for pain. 30 tablet 0   propranolol (INDERAL) 10 MG tablet TAKE 1 TABLET (10 MG TOTAL) BY MOUTH 3 (THREE) TIMES DAILY AS NEEDED. FOR ANXIETY ATTACKS 270 tablet 0   rizatriptan (MAXALT-MLT) 10 MG disintegrating tablet Take 1 tablet (10 mg total) by mouth as needed for migraine. May repeat in 2 hours if needed, that is max dose for 24 hours. 10 tablet 3   rosuvastatin (CRESTOR) 40 MG tablet TAKE 1 TABLET BY MOUTH EVERY DAY 90 tablet 0   tiZANidine (ZANAFLEX) 4 MG tablet Take 1 tablet (4 mg total) by mouth 4 (four) times daily. 360 tablet 1   traZODone (DESYREL) 100 MG tablet Take 1-1.5 tablets (100-150 mg total) by mouth at bedtime as needed for sleep. 135 tablet 0   valACYclovir (VALTREX) 1000 MG tablet TAKE 1 TABLET (1,000 MG TOTAL) BY MOUTH DAILY. FOR 5-7 DAYS, MAY REPEAT IF NEED FOR COLD SORE 90 tablet 1   venlafaxine XR (EFFEXOR-XR) 75 MG 24 hr capsule Take 1 capsule (75 mg total) by mouth daily with breakfast. 90 capsule 1   Zoledronic Acid (ZOMETA) 4 MG/100ML IVPB Once per year from Orthopedic 100 mL    furosemide (LASIX) 20 MG tablet Take 1  tablet (20 mg total) by mouth daily as needed for fluid or edema. (Patient not taking: Reported on 02/20/2022) 90 tablet 1   Vibegron (GEMTESA) 75 MG TABS Take 1 tablet (75 mg total) by mouth daily. (Patient not taking: Reported on 02/20/2022) 42 tablet 0   No current facility-administered medications for this visit.      PHYSICAL EXAMINATION:   Vitals:   03/16/22 1422  BP: (!) 158/83  Pulse: 65  Resp: 16  Temp: (!) 96.5 F (35.8 C)  SpO2: 97%   Filed Weights   03/16/22 1422  Weight: 206 lb (93.4 kg)    Physical Exam Vitals reviewed.  Constitutional:      Appearance: Normal appearance.  HENT:     Head: Normocephalic and atraumatic.     Mouth/Throat:     Pharynx: No oropharyngeal exudate.  Cardiovascular:     Rate and Rhythm: Normal rate and regular rhythm.  Pulmonary:     Effort: Pulmonary effort is  normal. No respiratory distress.     Breath sounds: Normal breath sounds. No wheezing.  Musculoskeletal:        General: No tenderness.     Cervical back: Normal range of motion and neck supple.  Skin:    General: Skin is warm.     Coloration: Skin is not pale.  Neurological:     Mental Status: She is alert and oriented to person, place, and time.  Psychiatric:        Mood and Affect: Affect normal.     LABORATORY DATA:  I have reviewed the data as listed Lab Results  Component Value Date   WBC 6.2 03/07/2022   HGB 12.1 03/07/2022   HCT 39.5 03/07/2022   MCV 91.2 03/07/2022   PLT 179 03/07/2022   Recent Labs    04/28/21 0824 04/28/21 0824 09/01/21 0813 11/15/21 1110 12/27/21 0915 03/07/22 1349  NA 142  --  139 143 142 138  K 4.3  --  4.0 4.2 4.6 4.1  CL 104  --  104 110 107 103  CO2 30  --  '29 30 28 26  '$ GLUCOSE 82  --  91 94 114* 136*  BUN 19  --  '16 16 13 18  '$ CREATININE 1.07*   < > 0.81 0.69 0.59 0.75  CALCIUM 9.1  --  9.0 9.0 8.7 8.8*  GFRNONAA  --   --  >60 >60  --  >60  PROT 6.4  --   --  7.2  --   --   ALBUMIN  --   --   --  4.1  --   --    AST 31  --   --  65*  --   --   ALT 25  --   --  58*  --   --   ALKPHOS  --   --   --  34*  --   --   BILITOT 0.8  --   --  0.6  --   --    < > = values in this interval not displayed.  Iron/TIBC/Ferritin/ %Sat    Component Value Date/Time   IRON 77 03/07/2022 1349   TIBC 417 03/07/2022 1349   FERRITIN 25 03/07/2022 1349   IRONPCTSAT 19 03/07/2022 1349   IRONPCTSAT 15 (L) 12/27/2021 0915    Encounter Diagnoses  Name Primary?   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 She will HOLD B12 injections until next follow up in 1 month  Vitamin D deficiency Chronic and secondary to malabsorption (see above) Currently taking OTC vitamin D 5000 IU Weekly Goal > 50. 03/07/2022 was 93.33.  Continue Daily 1,000 IU   Mild intermittent thrombocytopenia [place] Recent CBC was normal with platelets of 179  DISPOSITION:  Discussed delaying her IV Venofer x 1 month when she can return for labs given her improvements and  current iron/Hgb level however pt strongly prefers IV Iron today.  IV Venofer tdoay RTC 1 month labs (B12) # 6 mo- labs (cbc, bmp, ferritin, iron studies, b12, vitamin d) # Few days later- Dr. Rogue Bussing, +/- venofer- [place]  All questions were answered. The patient knows to call the clinic with any problems, questions or concerns.  Hughie Closs, PA-C 03/16/2022

## 2022-03-19 ENCOUNTER — Encounter: Payer: Self-pay | Admitting: Family Medicine

## 2022-03-19 ENCOUNTER — Ambulatory Visit (INDEPENDENT_AMBULATORY_CARE_PROVIDER_SITE_OTHER): Payer: Medicare HMO | Admitting: Family Medicine

## 2022-03-19 VITALS — BP 116/62 | HR 89 | Ht 62.0 in | Wt 200.2 lb

## 2022-03-19 DIAGNOSIS — H6993 Unspecified Eustachian tube disorder, bilateral: Secondary | ICD-10-CM

## 2022-03-19 DIAGNOSIS — G8929 Other chronic pain: Secondary | ICD-10-CM

## 2022-03-19 DIAGNOSIS — J329 Chronic sinusitis, unspecified: Secondary | ICD-10-CM

## 2022-03-19 DIAGNOSIS — M79671 Pain in right foot: Secondary | ICD-10-CM | POA: Diagnosis not present

## 2022-03-19 MED ORDER — AZELASTINE HCL 0.1 % NA SOLN: 2.0000 | Freq: Two times a day (BID) | NASAL | 11 refills | Status: DC

## 2022-03-19 NOTE — Patient Instructions (Addendum)
Thank you for coming to the office today.  You most likely have Plantar Fasciitis of heel / foot. - This is inflammation of the fibrous connection on the bottom of the foot, and can have small micro tears over time that become painful. It usually will have flare ups lasting days to weeks, and may come back after it heals if it is re-aggravated again. - Often there is a bone spur or arthritis of the heel bone that causes this - Also it may be caused by abnormal footwear, walking pattern or other problems  If you are experiencing an acute flare with pain, this is usually worst first thing in the morning when the plantar fascia is tight and stiff. First step out of bed is painful usually, and it may gradually improve with stretching and walking.  START anti inflammatory topical - OTC Voltaren (generic Diclofenac) topical 2-4 times a day as needed for pain swelling of affected joint for 1-2 weeks or longer.  - It is safe to take Tylenol Ext Str '500mg'$  tabs - take 1 to 2 (max dose '1000mg'$ ) every 6 hours as needed for breakthrough pain, max 24 hour daily dose is 6 to 8 tablets or '4000mg'$   Recommend: - Rest / relative rest with activity modification avoid overuse / prolonged stand - Ice packs (make sure you use a towel or sock / something to protect skin)  May also try topical muscle rub, icy hot, tiger balm  Start the exercises listed below, gradually increase them as instructed. May be sore at first and hopefully will stretch out and help reduce pain later.  If not improving after 1-2 weeks on medicine, and stretching exercises and some rest - then can contact our office again for re-evaluation may consider other causes or can refer you to Orthopedic specialist to have second opinion, and consider a steroid injection.  ----------------  ------------  Start Azelastine allergy nasal 2 sprays each nostril twice a day  CONTINUE Flonase nasal steroid 2 sprays each nostril ONCE a day  Keep both  going to help clear fluid from behind ears due to eustachian tube dysfunction keeping that fluid behind ear drums.  ------  Check into [facility]  -------------  Call insurance find cost and coverage of the following - check the following: - Drug Tier, Preferred List, On Formulary - All will require a "Prior Authorization" from [place] first, before you can find out the cost - Find out if there is "Step Therapy" (other medicines required before you can try these)  Once you pick the one you want to try, let me know - we can get a sample ready IF we have it in stock. Then try it - and before running out of medicine, contact me back to order your Rx so we have time to get it processed.  For Weight Loss / Obesity only  Wegovy (same as Ozempic) weekly injection - start 0.'25mg'$  weekly, 1 dose per pen, single use, auto-injector  2. Saxenda - DAILY injection - start 0.'6mg'$  injection DAILY, you can increase the dose by 1 notch or 0.6 mg per week, if you don't tolerate a dose, can reduce it the next day.  3. Zepbound (same as Mounjaro) weekly injection  Future make sure your insurance has weight loss coverage  WEIGHT MANAGEMENT  Dr Dennard Nip  [facility] [place], [place] [address] Ph: [phone]  ------  Plantar Fascia Stretches / Exercises  See other page with pictures of each exercise.  Start with 1 or 2 of these exercises that you are most comfortable with. Do not do any exercises that cause you significant worsening pain. Some of these may cause some "stretching soreness" but it should go away after you stop the exercise, and get better over time. Gradually increase up to 3-4 exercises as tolerated.  You may begin exercising the muscles of your foot right away by gently stretching them as follows:  Stretching: Towel stretch: Sit on a hard surface with your injured leg stretched out in front  of you. Loop a towel around the ball of your foot and pull the towel toward your body keeping your knee straight. Hold this position for 15 to 30 seconds then relax. Repeat 3 times. When the towel stretch becomes to easy, you may begin doing the standing calf stretch.  Standing calf stretch: Facing a wall, put your hands against the wall at about eye level. Keep the injured leg back, the uninjured leg forward, and the heel of your injured leg on the floor. Turn your injured foot slightly inward (as if you were pigeon-toed) as you slowly lean into the wall until you feel a stretch in the back of your calf. Hold for 15 to 30 seconds. Repeat 3 times. Do this exercise several times each day. When you can stand comfortably on your injured foot, you can begin stretching the bottom of your foot using the plantar fascia stretch.  Plantar fascia stretch: Stand with the ball of your injured foot on a stair. Reach for the bottom step with your heel until you feel a stretch in the arch of your foot. Hold this position for 15 to 30 seconds and then relax. Repeat 3 times. After you have stretched the bottom muscles of your foot, you can begin strengthening the top muscles of your foot.  Frozen can roll: Roll your bare injured foot back and forth from your heel to your mid-arch over a frozen juice can. Repeat for 3 to 5 minutes. This exercise is particularly helpful if done first thing in the morning. Towel pickup: With your heel on the ground, pick up a towel with your toes. Release. Repeat 10 to 20 times. When this gets easy, add more resistance by placing a book or small weight on the towel. Static and dynamic balance exercises Place a chair next to your non-injured leg and stand upright. (This will provide you with balance if needed.) Stand on your injured foot. Try to raise the arch of your foot while keeping your toes on the floor. Try to maintain this position and balance on your injured side for 30 seconds.  This exercise can be made more difficult by doing it on a piece of foam or a pillow, or with your eyes closed. Stand in the same position as above. Keep your foot in this position and reach forward in front of you with your injured side's hand, allowing your knee to bend. Repeat this 10 times while maintaining the arch height. This exercise can be made more difficult by reaching farther in front of you. Do 2 sets. Stand in the same position as above. While maintaining your arch height, reach the injured side's hand across your body toward the chair. The farther you reach, the more challenging the exercise. Do 2 sets of 10.  Next, you can begin strengthening the muscles of your foot and lower leg by using elastic  tubing.  Strengthening: Resisted dorsiflexion: Sit with your injured leg out straight and your foot facing a doorway. Tie a loop in one end of the tubing. Put your foot through the loop so that the tubing goes around the arch of your foot. Tie a knot in the other end of the tubing and shut the knot in the door. Move backward until there is tension in the tubing. Keeping your knee straight, pull your foot toward your body, stretching the tubing. Slowly return to the starting position. Do 3 sets of 10. Resisted plantar flexion: Sit with your leg outstretched and loop the middle section of the tubing around the ball of your foot. Hold the ends of the tubing in both hands. Gently press the ball of your foot down and point your toes, stretching the tubing. Return to the starting position. Do 3 sets of 10. Resisted inversion: Sit with your legs out straight and cross your uninjured leg over your injured ankle. Wrap the tubing around the ball of your injured foot and then loop it around your uninjured foot so that the tubing is anchored there at one end. Hold the other end of the tubing in your hand. Turn your injured foot inward and upward. This will stretch the tubing. Return to the starting position. Do  3 sets of 10. Resisted eversion: Sit with both legs stretched out in front of you, with your feet about a shoulder's width apart. Tie a loop in one end of the tubing. Put your injured foot through the loop so that the tubing goes around the arch of that foot and wraps around the outside of the uninjured foot. Hold onto the other end of the tubing with your hand to provide tension. Turn your injured foot up and out. Make sure you keep your uninjured foot still so that it will allow the tubing to stretch as you move your injured foot. Return to the starting position. Do 3 sets of 10.    Please schedule a Follow-up Appointment to: Return in about 4 weeks (around 04/16/2022), or if symptoms worsen or fail to improve, for 4 weeks as needed for plantar fasciitis.  If you have any other questions or concerns, please feel free to call the office or send a message through [facility]. You may also schedule an earlier appointment if necessary.  Additionally, you may be receiving a survey about your experience at our office within a few days to 1 week by e-mail or mail. We value your feedback.  Nobie Putnam, DO [facility]

## 2022-03-19 NOTE — Progress Notes (Signed)
Subjective:    Patient ID: Ashley Fields, female    DOB: [birth year], 67 y.o.   MRN: [id number]  Ashley Fields is a 67 y.o. female presenting on 03/19/2022 for Foot Pain and Ear Fullness   HPI  Left Ear Fullness Eustachan Tube Dysfunction Sinus persistent congestion  Chronic Back Pain S/p Back Surgery Weight down 6 lbs with improved diet.  Chronic Right Foot Heel Pain History of Plantar fasciitis previously treated, now has flare up again. Wearing insoles and inserts without any relief Worse first steps out of bed and activity. Improves with walking, then pain and again if tightens up with prolong seated or rest off feet.  Obesity BMI >36 Tried Contrave previously ineffective      01/15/2022   11:45 AM 12/27/2021    8:22 AM 11/09/2021    8:26 AM  Depression screen PHQ 2/9  Decreased Interest  1 3  Down, Depressed, Hopeless  0 0  PHQ - 2 Score  1 3  Altered sleeping  0 0  Tired, decreased energy  1 0  Change in appetite  0 0  Feeling bad or failure about yourself   0 0  Trouble concentrating  0 0  Moving slowly or fidgety/restless  0 0  Suicidal thoughts  0 0  PHQ-9 Score  2 3  Difficult doing work/chores  Not difficult at all Not difficult at all     Information is confidential and restricted. Go to Review Flowsheets to unlock data.    Social History   Tobacco Use   Smoking status: Former    Packs/day: 0.25    Years: 10.00    Total pack years: 2.50    Types: Cigarettes    Quit date: 11/29/1995    Years since quitting: 26.3    Passive exposure: Past   Smokeless tobacco: Former   Tobacco comments:    Quite 1997, didnt smoke hardly at all when I was smoking.  Vaping Use   Vaping Use: Never used  Substance Use Topics   Alcohol use: No   Drug use: No    Review of Systems Per HPI unless specifically indicated above     Objective:    BP 116/62   Pulse 89   Ht 5\' 2"  (1.575 m)   Wt 200 lb 3.2 oz (90.8 kg)   SpO2 97%   BMI 36.62 kg/m   Wt Readings from  Last 3 Encounters:  03/19/22 200 lb 3.2 oz (90.8 kg)  03/16/22 206 lb (93.4 kg)  02/09/22 195 lb (88.5 kg)    Physical Exam Vitals and nursing note reviewed.  Constitutional:      General: She is not in acute distress.    Appearance: Normal appearance. She is well-developed. She is not diaphoretic.     Comments: Well-appearing, comfortable, cooperative  HENT:     Head: Normocephalic and atraumatic.     Right Ear: There is no impacted cerumen.     Left Ear: There is no impacted cerumen.     Ears:     Comments: Bilateral TM with effusion Eyes:     General:        Right eye: No discharge.        Left eye: No discharge.     Conjunctiva/sclera: Conjunctivae normal.  Cardiovascular:     Rate and Rhythm: Normal rate.  Pulmonary:     Effort: Pulmonary effort is normal.  Musculoskeletal:     Comments: Right foot with localized medial heel pain. High  arch  Skin:    General: Skin is warm and dry.     Findings: No erythema or rash.  Neurological:     Mental Status: She is alert and oriented to person, place, and time.  Psychiatric:        Mood and Affect: Mood normal.        Behavior: Behavior normal.        Thought Content: Thought content normal.     Comments: Well groomed, good eye contact, normal speech and thoughts    Results for orders placed or performed in visit on 03/07/22  VITAMIN D 25 Hydroxy (Vit-D Deficiency, Fractures)  Result Value Ref Range   Vit D, 25-Hydroxy 93.33 30 - 100 ng/mL  Vitamin B12  Result Value Ref Range   Vitamin B-12 >7,500 (H) 180 - 914 pg/mL  Ferritin  Result Value Ref Range   Ferritin 25 11 - 307 ng/mL  Basic Metabolic Panel - Cancer Center Only  Result Value Ref Range   Sodium 138 135 - 145 mmol/L   Potassium 4.1 3.5 - 5.1 mmol/L   Chloride 103 98 - 111 mmol/L   CO2 26 22 - 32 mmol/L   Glucose, Bld 136 (H) 70 - 99 mg/dL   BUN 18 8 - 23 mg/dL   Creatinine 1.61 0.96 - 1.00 mg/dL   Calcium 8.8 (L) 8.9 - 10.3 mg/dL   GFR, Estimated >04  >54 mL/min   Anion gap 9 5 - 15  CBC with Differential (Cancer Center Only)  Result Value Ref Range   WBC Count 6.2 4.0 - 10.5 K/uL   RBC 4.33 3.87 - 5.11 MIL/uL   Hemoglobin 12.1 12.0 - 15.0 g/dL   HCT 09.8 11.9 - 14.7 %   MCV 91.2 80.0 - 100.0 fL   MCH 27.9 26.0 - 34.0 pg   MCHC 30.6 30.0 - 36.0 g/dL   RDW 82.9 56.2 - 13.0 %   Platelet Count 179 150 - 400 K/uL   nRBC 0.0 0.0 - 0.2 %   Neutrophils Relative % 61 %   Neutro Abs 3.8 1.7 - 7.7 K/uL   Lymphocytes Relative 27 %   Lymphs Abs 1.7 0.7 - 4.0 K/uL   Monocytes Relative 6 %   Monocytes Absolute 0.4 0.1 - 1.0 K/uL   Eosinophils Relative 5 %   Eosinophils Absolute 0.3 0.0 - 0.5 K/uL   Basophils Relative 1 %   Basophils Absolute 0.0 0.0 - 0.1 K/uL   Immature Granulocytes 0 %   Abs Immature Granulocytes 0.02 0.00 - 0.07 K/uL  Iron and TIBC  Result Value Ref Range   Iron 77 28 - 170 ug/dL   TIBC 865 784 - 696 ug/dL   Saturation Ratios 19 10.4 - 31.8 %   UIBC 340 ug/dL      Assessment & Plan:   Problem List Items Addressed This Visit   None Visit Diagnoses     Eustachian tube dysfunction, bilateral    -  Primary   Relevant Medications   azelastine (ASTELIN) 0.1 % nasal spray   Chronic rhinosinusitis       Relevant Medications   azelastine (ASTELIN) 0.1 % nasal spray   Right foot pain       Chronic pain of right heel           #Plantar Fasciitis, Right  Clinically consistent with subacute on chronic R plantar fasciitis based on history and exam, localized pain.  - Plan: 1. Reviewed diagnosis and management  of plantar fasciitis 2. Limited oral NSAID. Use topical Voltaren. Already on Etolodac 3. May take Tylenol PRN breakthrough 4. May use topical muscle rub, ice - Tension Night Splint 5. Emphasized importance of relative rest, ice, and avoid prolonged standing and overuse 6. Handout given and explained appropriate stretching and home exercises important to do first thing in morning and later 7. Follow-up  4-6 weeks if not improved, consider referral to Podiatry for further eval x-ray and injection if indicated.   Eustachian Tube Dysfunction No sign of infection on exam Start Azelastine allergy nasal 2 sprays each nostril twice a day CONTINUE Flonase nasal steroid 2 sprays each nostril ONCE a day Keep both going to help clear fluid from behind ears due to eustachian tube dysfunction keeping that fluid behind ear drums.  Obesity BMI >36 Discussion on weight loss options Failed Contrave Limited GLP availability / cost She will check into this first Consider other options - Check into [facility] [place] [facility]      Meds ordered this encounter  Medications   azelastine (ASTELIN) 0.1 % nasal spray    Sig: Place 2 sprays into both nostrils 2 (two) times daily. Use in each nostril as directed    Dispense:  30 mL    Refill:  11      Follow up plan: Return in about 4 weeks (around 04/16/2022), or if symptoms worsen or fail to improve, for 4 weeks as needed for plantar fasciitis.   Saralyn Pilar, DO [facility] 03/19/2022, 9:25 AM

## 2022-03-20 ENCOUNTER — Encounter: Payer: Self-pay | Admitting: Family Medicine

## 2022-03-22 DIAGNOSIS — Z6836 Body mass index (BMI) 36.0-36.9, adult: Secondary | ICD-10-CM | POA: Diagnosis not present

## 2022-03-22 DIAGNOSIS — S3210XD Unspecified fracture of sacrum, subsequent encounter for fracture with routine healing: Secondary | ICD-10-CM | POA: Diagnosis not present

## 2022-03-23 ENCOUNTER — Telehealth: Payer: Self-pay | Admitting: Family Medicine

## 2022-03-23 NOTE — Telephone Encounter (Signed)
Contacted Ashley Fields to schedule their annual wellness visit. Patient declined to schedule AWV at this time. Appointment with pcp in April for [place]; [place] Direct Dial: [phone]

## 2022-03-28 ENCOUNTER — Other Ambulatory Visit: Payer: Medicare HMO

## 2022-03-29 ENCOUNTER — Ambulatory Visit: Payer: Medicare HMO | Admitting: Family Medicine

## 2022-04-03 ENCOUNTER — Encounter: Payer: Self-pay | Admitting: Psychiatry

## 2022-04-03 ENCOUNTER — Telehealth (INDEPENDENT_AMBULATORY_CARE_PROVIDER_SITE_OTHER): Payer: Medicare HMO | Admitting: Psychiatry

## 2022-04-03 DIAGNOSIS — F4310 Post-traumatic stress disorder, unspecified: Secondary | ICD-10-CM

## 2022-04-03 DIAGNOSIS — G4701 Insomnia due to medical condition: Secondary | ICD-10-CM | POA: Diagnosis not present

## 2022-04-03 DIAGNOSIS — F410 Panic disorder [episodic paroxysmal anxiety] without agoraphobia: Secondary | ICD-10-CM

## 2022-04-03 DIAGNOSIS — F3162 Bipolar disorder, current episode mixed, moderate: Secondary | ICD-10-CM

## 2022-04-03 MED ORDER — TRAZODONE HCL 100 MG PO TABS: 100.0000 mg | ORAL_TABLET | Freq: Every evening | ORAL | 0 refills | Status: DC | PRN

## 2022-04-03 MED ORDER — PROPRANOLOL HCL 10 MG PO TABS: 10.0000 mg | ORAL_TABLET | Freq: Three times a day (TID) | ORAL | 0 refills | Status: DC | PRN

## 2022-04-03 MED ORDER — OXCARBAZEPINE 300 MG PO TABS: 300.0000 mg | ORAL_TABLET | Freq: Two times a day (BID) | ORAL | 0 refills | Status: DC

## 2022-04-03 NOTE — Progress Notes (Unsigned)
Virtual Visit via Video Note  I connected with Ashley Fields on 04/03/22 at 10:20 AM EDT by a video enabled telemedicine application and verified that I am speaking with the correct person using two identifiers.  Location Provider Location : Remote Office Patient Location : Home  Participants: Patient , Provider    I discussed the limitations of evaluation and management by telemedicine and the availability of in person appointments. The patient expressed understanding and agreed to proceed.   I discussed the assessment and treatment plan with the patient. The patient was provided an opportunity to ask questions and all were answered. The patient agreed with the plan and demonstrated an understanding of the instructions.   The patient was advised to call back or seek an in-person evaluation if the symptoms worsen or if the condition fails to improve as anticipated.   Breese MD OP Progress Note  04/04/2022 8:09 AM Ashley Fields  MRN:  [id number]  Chief Complaint:  Chief Complaint  Patient presents with   Follow-up   Depression   Medication Refill   HPI: Ashley Fields is a 67 year old Caucasian female, married, on disability, lives in [facility], has a history of bipolar disorder, PTSD, chronic pain, migraine headaches, history of laparoscopic gastric bypass was evaluated by telemedicine today.  Patient today reports she is currently improving with regards to her mood.  The Trileptal has definitely helped.  Patient however reports at the end of the day she continues to have irritability.  Her husband is currently going through health issues and that is also a stressor.  Patient denies any side effects to Trileptal.  Compliant on it.  Reports she has been doing a lot of walking, she reports that has definitely helped.  She has lost several pounds and is excited about that.  Patient reports sleep has improved.  Currently sleeps around 7 hours which is an improvement for her.  Patient denies  any suicidality, homicidality or perceptual disturbances.  Patient denies any other concerns today.    Visit Diagnosis:    ICD-10-CM   1. Bipolar 1 disorder, mixed, moderate (HCC)  F31.62 Oxcarbazepine (TRILEPTAL) 300 MG tablet   improving    2. PTSD (post-traumatic stress disorder)  F43.10 propranolol (INDERAL) 10 MG tablet    3. Panic disorder  F41.0     4. Insomnia due to medical condition  G47.01 traZODone (DESYREL) 100 MG tablet   pain, lack of sleep hygiene      Past Psychiatric History: I have reviewed past psychiatric history from progress note on 05/15/2018.  Past trials of Prozac, Wellbutrin, Lexapro, Cymbalta, Rexulti, Ambien-nightmares, Lunesta, Lamictal, risperidone.  Past Medical History:  Past Medical History:  Diagnosis Date   Allergy    Anemia    Anxiety    Arthritis    Yes   Back pain    Blood transfusion without reported diagnosis 1977   Transfusions from miscarriage & hemorrhaging   Chronic kidney disease    Depression    Disp fx of cuboid bone of right foot, init for clos fx 01/01/2017   Fibromyalgia    GERD (gastroesophageal reflux disease)    Headache    Hepatitis C    Hyperlipidemia    Lumbar radiculopathy    Neck pain    Neuromuscular disorder ([place]) 1997   Falling to much was an eye-opener   Osteoporosis    Thyroid disease    Urine incontinence     Past Surgical History:  Procedure Laterality Date   5  miscarriages     1977-1985, Blood transfusion s/p miscarriage 1977   ABDOMINAL EXPOSURE N/A 12/18/2021   Procedure: ABDOMINAL EXPOSURE;  Surgeon: Marty Heck, MD;  Location: [place];  Service: Vascular;  Laterality: N/A;   ABDOMINAL HYSTERECTOMY  1987   Total   ANTERIOR LUMBAR FUSION N/A 12/18/2021   Procedure: Anterior Lumbar Interbody Fusion  - Lumbar Five-Sacral One;  Surgeon: Eustace Moore, MD;  Location: [place];  Service: Neurosurgery;  Laterality: N/A;   APPENDECTOMY  12/2008   AUGMENTATION MAMMAPLASTY Bilateral 2015    Bilat   AUGMENTATION MAMMAPLASTY Bilateral 2018   implants redone w/ placement of implants under muscle   breast lift bilateral, implants  0000000   bilateral, silicon naturel   BREAST REDUCTION SURGERY Bilateral 1997   CESAREAN SECTION  07/27/1981   Placenta Previa   CHOLECYSTECTOMY  03/2004   Lap surgery   COLONOSCOPY WITH PROPOFOL N/A 06/13/2020   Procedure: COLONOSCOPY WITH PROPOFOL;  Surgeon: Virgel Manifold, MD;  Location: [facility];  Service: Endoscopy;  Laterality: N/A;   COSMETIC SURGERY     Breast implants w/ lift, tummy tuck, upper & lower Blepharop   ESOPHAGOGASTRODUODENOSCOPY (EGD) WITH PROPOFOL N/A 06/13/2020   Procedure: ESOPHAGOGASTRODUODENOSCOPY (EGD) WITH PROPOFOL;  Surgeon: Virgel Manifold, MD;  Location: [facility];  Service: Endoscopy;  Laterality: N/A;   EYE SURGERY  2017   Cataract surgery & lasik   GASTRIC BYPASS  2005   Laparoscopic   IVC FILTER INSERTION  12/2003   TrapEase Vena Cava Filter   LUMBAR LAMINECTOMY  06/2006   L4-L5 (spinal fusion)   mini tummy tuck  05/07/2016   Bilateral bra/back roll lift skin removal   neck fusion     c3-c7 cadavier bones and metal plates placed per patient   neck surgery C3-C7  02/10/2015   ACDF   PROSTATE SURGERY     REDUCTION MAMMAPLASTY Bilateral 1997   SHOULDER ACROMIOPLASTY Left 03/27/2013   w/ labral debridement   SMALL INTESTINE SURGERY  12/24/2003   Lap Gastric Bypass   SPINAL CORD STIMULATOR IMPLANT  11/2010   removed 05/2011   SPINE SURGERY  2008 2017   Fusion L4-L5, ACDF C4-C7    Family Psychiatric History: Reviewed family psychiatric history from progress note on 05/15/2018  Family History:  Family History  Problem Relation Age of Onset   COPD Mother    Lung cancer Mother    Diabetes Mother    Heart disease Mother    Stroke Mother    Heart attack Mother    Arthritis Mother    Cancer Mother    Hypertension Mother    Obesity Mother    Varicose Veins Mother    Heart  disease Father    Heart attack Father 25   Early death Father    Depression Sister        x 5 sisters   COPD Sister    Hypertension Sister    Heart disease Brother        x 3 brothers   Diabetes Brother    Colon polyps Sister    Hearing loss Sister    Heart attack Sister    Heart attack Brother    Arthritis Sister    Varicose Veins Sister    Arthritis Brother    Heart disease Brother    Hypertension Brother    Cancer Maternal Grandfather    Stroke Maternal Grandfather    COPD Sister    Obesity Sister  Depression Sister    Depression Sister    Heart disease Sister    Hypertension Sister    Diabetes Brother    Heart disease Brother    Hypertension Brother    Obesity Brother    Varicose Veins Brother    Heart disease Brother    Hypertension Brother    Obesity Brother    Hypertension Sister    Obesity Sister    Miscarriages / Stillbirths Maternal Aunt    Miscarriages / Stillbirths Paternal Aunt    Breast cancer Neg Hx     Social History: Reviewed social history from progress note on 05/15/2018. Social History   Socioeconomic History   Marital status: Married    Spouse name: Not on file   Number of children: 1   Years of education: High School   Highest education level: Some college, no degree  Occupational History   Occupation: disability  Tobacco Use   Smoking status: Former    Packs/day: 0.25    Years: 10.00    Additional pack years: 0.00    Total pack years: 2.50    Types: Cigarettes    Quit date: 11/29/1995    Years since quitting: 26.3    Passive exposure: Past   Smokeless tobacco: Former   Tobacco comments:    Quite 1997, didnt smoke hardly at all when I was smoking.  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]   Social Determinants of Health   Financial Resource Strain: [place]  (04/06/2021)   Overall Financial Resource Strain (CARDIA)    Difficulty of Paying Living Expenses: Not very hard  Food Insecurity: No Food Insecurity (12/21/2021)   Hunger Vital Sign    Worried About Running Out of Food in the Last Year: Never true    Ran Out of Food in the Last Year: Never true  Transportation Needs: No Transportation Needs (12/21/2021)   PRAPARE - Hydrologist (Medical): No    Lack of Transportation (Non-Medical): No  Physical Activity: Insufficiently Active (04/06/2021)   Exercise Vital Sign    Days of Exercise per Week: 2 days    Minutes of Exercise per Session: 10 min  Stress: Stress Concern Present (04/06/2021)   [place]    Feeling of Stress : Very much  Social Connections: Moderately Isolated (04/06/2021)   Social Connection and Isolation Panel [NHANES]    Frequency of Communication with Friends and Family: More than three times a week    Frequency of Social Gatherings with Friends and Family: More than three times a week    Attends Religious Services: Never    Marine scientist or Organizations: No    Attends Archivist Meetings: Never    Marital Status: Married    Allergies:  Allergies  Allergen Reactions   Flagyl [Metronidazole] Anaphylaxis   Cymbalta [Duloxetine Hcl] Anxiety   Tape Rash    Paper tape okay to use per patient.    Metabolic Disorder Labs: Lab Results  Component Value Date   HGBA1C 4.8 04/28/2021  MPG 91 04/28/2021   MPG 97 03/30/2020   No results found for: "PROLACTIN" Lab Results  Component Value Date   CHOL 132 04/28/2021   TRIG 79 04/28/2021   HDL 76 04/28/2021   CHOLHDL 1.7 04/28/2021   LDLCALC 40 04/28/2021    LDLCALC 49 03/30/2020   Lab Results  Component Value Date   TSH 3.29 04/28/2021   TSH 1.97 03/30/2020    Therapeutic Level Labs: No results found for: "LITHIUM" No results found for: "VALPROATE" No results found for: "CBMZ"  Current Medications: Current Outpatient Medications  Medication Sig Dispense Refill   aspirin EC (ASPIRIN LOW DOSE) 81 MG tablet TAKE 1 TABLET BY MOUTH EVERY DAY 90 tablet 0   azelastine (ASTELIN) 0.1 % nasal spray Place 2 sprays into both nostrils 2 (two) times daily. Use in each nostril as directed 30 mL 11   bisacodyl (DULCOLAX) 5 MG EC tablet Take 5 mg by mouth daily as needed for moderate constipation.     butalbital-acetaminophen-caffeine (FIORICET) 50-325-40 MG tablet Take 1 tablet by mouth 2 (two) times daily as needed for headache.     Cholecalciferol 250 MCG (10000 UT) CAPS Take 10,000 Units by mouth once a week.     cyanocobalamin (VITAMIN B12) 1000 MCG/ML injection Inject 1 mL (1,000 mcg total) into the muscle every 30 (thirty) days. 1 mL 11   diclofenac Sodium (VOLTAREN) 1 % GEL Apply 2 g topically 4 (four) times daily as needed (pain).     estradiol (ESTRACE) 1 MG tablet TAKE 1 TABLET EVERY DAY (Patient taking differently: Take 1 mg by mouth daily.) 90 tablet 1   etodolac (LODINE) 500 MG tablet Take 1 tablet by mouth 2 (two) times daily.     ezetimibe (ZETIA) 10 MG tablet Take 1 tablet (10 mg total) by mouth daily. 90 tablet 3   fluticasone (FLONASE) 50 MCG/ACT nasal spray SPRAY 2 SPRAYS INTO EACH NOSTRIL EVERY DAY 48 mL 0   gabapentin (NEURONTIN) 600 MG tablet Take 1 tablet (600 mg total) by mouth in the morning, at noon, in the evening, and at bedtime. 360 tablet 1   hydrOXYzine (ATARAX) 25 MG tablet Take 1 tablet (25 mg total) by mouth every 8 (eight) hours as needed for anxiety. 270 tablet 1   omeprazole (PRILOSEC) 40 MG capsule Take 1 capsule (40 mg total) by mouth daily. 90 capsule 0   ondansetron (ZOFRAN ODT) 4 MG disintegrating tablet Take 1  tablet (4 mg total) by mouth every 8 (eight) hours as needed for nausea or vomiting. 30 tablet 2   Oxcarbazepine (TRILEPTAL) 300 MG tablet Take 1 tablet (300 mg total) by mouth 2 (two) times daily. 180 tablet 0   oxyCODONE-acetaminophen (PERCOCET) 10-325 MG tablet Take 1 tablet by mouth every 4 (four) hours as needed for pain. 30 tablet 0   rizatriptan (MAXALT-MLT) 10 MG disintegrating tablet Take 1 tablet (10 mg total) by mouth as needed for migraine. May repeat in 2 hours if needed, that is max dose for 24 hours. 10 tablet 3   rosuvastatin (CRESTOR) 40 MG tablet TAKE 1 TABLET BY MOUTH EVERY DAY 90 tablet 0   tiZANidine (ZANAFLEX) 4 MG tablet Take 1 tablet (4 mg total) by mouth 4 (four) times daily. 360 tablet 1   valACYclovir (VALTREX) 1000 MG tablet TAKE 1 TABLET (1,000 MG TOTAL) BY MOUTH DAILY. FOR 5-7 DAYS, MAY REPEAT IF NEED FOR COLD SORE 90 tablet 1   venlafaxine XR (EFFEXOR-XR) 75 MG 24 hr capsule  Take 1 capsule (75 mg total) by mouth daily with breakfast. 90 capsule 1   Zoledronic Acid (ZOMETA) 4 MG/100ML IVPB Once per year from Orthopedic 100 mL    furosemide (LASIX) 20 MG tablet Take 1 tablet (20 mg total) by mouth daily as needed for fluid or edema. (Patient not taking: Reported on 02/20/2022) 90 tablet 1   propranolol (INDERAL) 10 MG tablet Take 1 tablet (10 mg total) by mouth 3 (three) times daily as needed. For anxiety attacks 270 tablet 0   traZODone (DESYREL) 100 MG tablet Take 1-1.5 tablets (100-150 mg total) by mouth at bedtime as needed for sleep. 135 tablet 0   Vibegron (GEMTESA) 75 MG TABS Take 1 tablet (75 mg total) by mouth daily. (Patient not taking: Reported on 04/03/2022) 42 tablet 0   No current facility-administered medications for this visit.     Musculoskeletal: Strength & Muscle Tone:  UTA Gait & Station:  Seated Patient leans: N/A  Psychiatric Specialty Exam: Review of Systems  Musculoskeletal:  Positive for back pain (chronic).  Psychiatric/Behavioral:          Mood swings  All other systems reviewed and are negative.   There were no vitals taken for this visit.There is no height or weight on file to calculate BMI.  General Appearance: Casual  Eye Contact:  Fair  Speech:  Clear and Coherent  Volume:  Normal  Mood:   mood swings  Affect:  Appropriate  Thought Process:  Goal Directed and Descriptions of Associations: Intact  Orientation:  Full (Time, Place, and Person)  Thought Content: Logical   Suicidal Thoughts:  No  Homicidal Thoughts:  No  Memory:  Immediate;   Fair Recent;   Good Remote;   Fair  Judgement:  Fair  Insight:  Fair  Psychomotor Activity:  Normal  Concentration:  Concentration: Fair and Attention Span: Fair  Recall:  [employer] of Knowledge: Fair  Language: Fair  Akathisia:  No  Handed:  Right  AIMS (if indicated): not done  Assets:  Communication Skills Desire for Improvement Housing Intimacy Social Support  ADL's:  Intact  Cognition: WNL  Sleep:   improving   Screenings: AIMS    Flowsheet Row Video Visit from 06/13/2021 in [place] Video Visit from 05/16/2021 in [place] Total Score 0 0      GAD-7    Flowsheet Row Video Visit from 01/15/2022 in [place] Office Visit from 12/27/2021 in [facility] Office Visit from 11/09/2021 in [facility] Video Visit from 10/24/2021 in [place] Office Visit from 09/12/2021 in [facility]  Total GAD-7 Score 9 0 0 5 1      PHQ2-9    Flowsheet Row Video Visit from 01/15/2022 in [place] Office Visit from 12/27/2021 in [facility] Office Visit from 11/09/2021 in [facility] Video Visit from 10/24/2021 in [place] Office Visit from 09/12/2021 in [facility]  PHQ-2 Total Score 2 1 3  0 1  PHQ-9 Total Score 6 2 3 4 3       Flowsheet Row Video Visit from 04/03/2022 in [place] Video Visit from 02/20/2022 in [place] Video Visit from  01/15/2022 in [facility]  C-SSRS RISK CATEGORY No Risk No Risk No Risk        Assessment and Plan: Ashley Fields is a 67 year old Caucasian female who has a history of PTSD, bipolar disorder, panic disorder, migraine headaches, was evaluated by telemedicine today.  Patient is currently improving although with mild symptoms of irritability in the evening, tolerating Trileptal well.  Plan as noted below.  Plan Bipolar disorder type I mixed moderate-improving Increase Trileptal to 300 mg p.o. twice daily Continue gabapentin 600 mg p.o. 3 times daily.  Prescribed by primary care provider for pain.  PTSD-stable Venlafaxine 75 mg p.o. daily  Panic disorder-improving Patient was referred for CBT-[facility].  Patient reports she is not interested in this right now.  Insomnia-improving Trazodone 100-150 mg p.o. nightly She will need sufficient pain management  Follow-up in clinic in 2 months or sooner if needed.  Consent: Patient/Guardian gives verbal consent for treatment and assignment of benefits for services provided during this visit. Patient/Guardian expressed understanding and agreed to proceed.   This note was generated in part or whole with voice recognition software. Voice recognition is usually quite accurate but there are transcription errors that can and very often do occur. I apologize for any typographical errors that were not detected and corrected.    Ursula Alert, MD 04/04/2022, 8:09 AM

## 2022-04-08 ENCOUNTER — Other Ambulatory Visit: Payer: Self-pay | Admitting: Family Medicine

## 2022-04-08 DIAGNOSIS — J3089 Other allergic rhinitis: Secondary | ICD-10-CM

## 2022-04-08 DIAGNOSIS — K219 Gastro-esophageal reflux disease without esophagitis: Secondary | ICD-10-CM

## 2022-04-09 ENCOUNTER — Other Ambulatory Visit: Payer: Self-pay | Admitting: Psychiatry

## 2022-04-09 DIAGNOSIS — G4701 Insomnia due to medical condition: Secondary | ICD-10-CM

## 2022-04-09 NOTE — Telephone Encounter (Signed)
Requested Prescriptions  Pending Prescriptions Disp Refills   fluticasone (FLONASE) 50 MCG/ACT nasal spray [Pharmacy Med Name: FLUTICASONE PROP 50 MCG SPRAY] 48 mL 0    Sig: SPRAY 2 SPRAYS INTO EACH NOSTRIL EVERY DAY     Ear, Nose, and Throat: Nasal Preparations - Corticosteroids Passed - 04/08/2022  1:56 PM      Passed - Valid encounter within last 12 months    Recent Outpatient Visits           3 weeks ago Eustachian tube dysfunction, bilateral   [facility], Devonne Doughty, DO   3 months ago Spinal stenosis of lumbar region without neurogenic claudication   [place], DO   5 months ago Hordeolum internum of right upper eyelid   [place], DO   6 months ago Hearing loss, unspecified hearing loss type, unspecified laterality   [place], DO   9 months ago Spinal stenosis of lumbar region without neurogenic claudication   [place], DO       Future Appointments             In 3 weeks [place], Devonne Doughty, DO [facility]             omeprazole (PRILOSEC) 40 MG capsule [Pharmacy Med Name: OMEPRAZOLE DR 40 MG CAPSULE] 90 capsule 0    Sig: TAKE 1 CAPSULE (40 MG TOTAL) BY MOUTH DAILY.     Gastroenterology: Proton Pump Inhibitors Passed - 04/08/2022  1:56 PM      Passed - Valid encounter within last 12 months    Recent Outpatient Visits           3 weeks ago Eustachian tube dysfunction, bilateral   [facility] [place], Devonne Doughty, DO   3 months ago Spinal stenosis of lumbar region without neurogenic claudication   [place], DO   5 months ago Hordeolum internum of right upper eyelid   [place], DO   6 months ago Hearing loss, unspecified hearing loss type, unspecified laterality   [place], DO   9 months ago Spinal stenosis of lumbar region without neurogenic claudication   [place], DO       Future Appointments             In 3 weeks [place], Devonne Doughty, DO [facility], [facility]

## 2022-04-10 DIAGNOSIS — Z79899 Other long term (current) drug therapy: Secondary | ICD-10-CM | POA: Diagnosis not present

## 2022-04-10 DIAGNOSIS — M542 Cervicalgia: Secondary | ICD-10-CM | POA: Diagnosis not present

## 2022-04-10 DIAGNOSIS — M533 Sacrococcygeal disorders, not elsewhere classified: Secondary | ICD-10-CM | POA: Diagnosis not present

## 2022-04-10 DIAGNOSIS — M479 Spondylosis, unspecified: Secondary | ICD-10-CM | POA: Diagnosis not present

## 2022-04-10 DIAGNOSIS — M5416 Radiculopathy, lumbar region: Secondary | ICD-10-CM | POA: Diagnosis not present

## 2022-04-10 DIAGNOSIS — M25552 Pain in left hip: Secondary | ICD-10-CM | POA: Diagnosis not present

## 2022-04-10 DIAGNOSIS — G894 Chronic pain syndrome: Secondary | ICD-10-CM | POA: Diagnosis not present

## 2022-04-10 DIAGNOSIS — M47896 Other spondylosis, lumbar region: Secondary | ICD-10-CM | POA: Diagnosis not present

## 2022-04-10 DIAGNOSIS — M48061 Spinal stenosis, lumbar region without neurogenic claudication: Secondary | ICD-10-CM | POA: Diagnosis not present

## 2022-04-16 ENCOUNTER — Encounter (INDEPENDENT_AMBULATORY_CARE_PROVIDER_SITE_OTHER): Payer: Medicare HMO | Admitting: Family Medicine

## 2022-04-16 ENCOUNTER — Other Ambulatory Visit: Payer: Self-pay | Admitting: Family Medicine

## 2022-04-16 ENCOUNTER — Inpatient Hospital Stay: Payer: Medicare HMO

## 2022-04-16 DIAGNOSIS — H1031 Unspecified acute conjunctivitis, right eye: Secondary | ICD-10-CM

## 2022-04-16 DIAGNOSIS — H00021 Hordeolum internum right upper eyelid: Secondary | ICD-10-CM

## 2022-04-17 NOTE — Telephone Encounter (Signed)
Please see the MyChart message reply(ies) for my assessment and plan.    This patient gave consent for this Medical Advice Message and is aware that it may result in a bill to their insurance company, as well as the possibility of receiving a bill for a co-payment or deductible. They are an established patient, but are not seeking medical advice exclusively about a problem treated during an in person or video visit in the last seven days. I did not recommend an in person or video visit within seven days of my reply.    I spent a total of 7 minutes cumulative time within 7 days through MyChart messaging.  Blane Worthington, DO   

## 2022-05-01 ENCOUNTER — Encounter: Payer: Self-pay | Admitting: Family Medicine

## 2022-05-01 ENCOUNTER — Ambulatory Visit (INDEPENDENT_AMBULATORY_CARE_PROVIDER_SITE_OTHER): Payer: Medicare HMO | Admitting: Family Medicine

## 2022-05-01 VITALS — BP 127/79 | HR 78 | Ht 62.0 in | Wt 189.0 lb

## 2022-05-01 DIAGNOSIS — Z981 Arthrodesis status: Secondary | ICD-10-CM | POA: Diagnosis not present

## 2022-05-01 DIAGNOSIS — E782 Mixed hyperlipidemia: Secondary | ICD-10-CM

## 2022-05-01 DIAGNOSIS — R7309 Other abnormal glucose: Secondary | ICD-10-CM | POA: Diagnosis not present

## 2022-05-01 DIAGNOSIS — G894 Chronic pain syndrome: Secondary | ICD-10-CM

## 2022-05-01 DIAGNOSIS — E538 Deficiency of other specified B group vitamins: Secondary | ICD-10-CM | POA: Diagnosis not present

## 2022-05-01 DIAGNOSIS — K219 Gastro-esophageal reflux disease without esophagitis: Secondary | ICD-10-CM

## 2022-05-01 DIAGNOSIS — Z0000 Encounter for general adult medical examination without abnormal findings: Secondary | ICD-10-CM

## 2022-05-01 DIAGNOSIS — D509 Iron deficiency anemia, unspecified: Secondary | ICD-10-CM | POA: Diagnosis not present

## 2022-05-01 DIAGNOSIS — G8929 Other chronic pain: Secondary | ICD-10-CM

## 2022-05-01 DIAGNOSIS — E041 Nontoxic single thyroid nodule: Secondary | ICD-10-CM | POA: Diagnosis not present

## 2022-05-01 DIAGNOSIS — M5441 Lumbago with sciatica, right side: Secondary | ICD-10-CM

## 2022-05-01 DIAGNOSIS — M5442 Lumbago with sciatica, left side: Secondary | ICD-10-CM

## 2022-05-01 DIAGNOSIS — H00011 Hordeolum externum right upper eyelid: Secondary | ICD-10-CM

## 2022-05-01 DIAGNOSIS — B379 Candidiasis, unspecified: Secondary | ICD-10-CM

## 2022-05-01 DIAGNOSIS — I739 Peripheral vascular disease, unspecified: Secondary | ICD-10-CM

## 2022-05-01 LAB — CBC WITH DIFFERENTIAL/PLATELET
Eosinophils Relative: 3.4 %
Lymphs Abs: 1266 cells/uL (ref 850–3900)
MCHC: 32 g/dL (ref 32.0–36.0)
MCV: 88.1 fL (ref 80.0–100.0)
Neutrophils Relative %: 68.8 %
RDW: 14.5 % (ref 11.0–15.0)
WBC: 6.3 10*3/uL (ref 3.8–10.8)

## 2022-05-01 LAB — COMPLETE METABOLIC PANEL WITH GFR
BUN: 19 mg/dL (ref 7–25)
CO2: 32 mmol/L (ref 20–32)
Calcium: 9.7 mg/dL (ref 8.6–10.4)
Creat: 0.88 mg/dL (ref 0.50–1.05)
Sodium: 142 mmol/L (ref 135–146)
eGFR: 73 mL/min/{1.73_m2} (ref >=60)

## 2022-05-01 LAB — LIPID PANEL: HDL: 69 mg/dL (ref >=50)

## 2022-05-01 MED ORDER — FLUCONAZOLE 150 MG PO TABS
ORAL_TABLET | ORAL | 0 refills | Status: DC
Start: 2022-05-01 — End: 2022-07-02

## 2022-05-01 MED ORDER — GABAPENTIN 600 MG PO TABS: 600.0000 mg | ORAL_TABLET | Freq: Four times a day (QID) | ORAL | 3 refills | Status: DC

## 2022-05-01 MED ORDER — AMOXICILLIN-POT CLAVULANATE 875-125 MG PO TABS
1.0000 | ORAL_TABLET | Freq: Two times a day (BID) | ORAL | 0 refills | Status: DC
Start: 2022-05-01 — End: 2022-05-28

## 2022-05-01 MED ORDER — OMEPRAZOLE 40 MG PO CPDR: 40.0000 mg | DELAYED_RELEASE_CAPSULE | Freq: Every day | ORAL | 3 refills | Status: DC

## 2022-05-01 NOTE — Patient Instructions (Addendum)
Thank you for coming to the office today.  For Mammogram screening for breast cancer   AUGUST   Call the Imaging Center below anytime to schedule your own appointment now that order has been placed.  [facility] [address], [address] [place], [place] [address] Phone: [phone]   1. It looks like a Stye or "Hordeoleum" of your eyelid, this is a blocked duct infection, it may come back again if it does not fully resolve. If it develops a more hard or firm cyst, we consider it a "Chalazion" and it is less likely to resolve on it's own - Initial treatment is warm compresses up to 4 to 6 times a day using warm washcloth place over eyelid and apply gentle pressure, hold this on for 10-15 min at a time, can re-warm if cools down  Try oral antibiotic  It may drain pus and reduce in size, this is ideal, otherwise if significant worsening with spreading of redness or swelling onto eyelid or around face, unable to see, fevers/chills, then please return to clinic sooner or call, may go to Emergency Dept if significant worsening  Otherwise, if improving but not going away after 1-2 weeks, call us back and we will refer you to Ophthalmology to have it removed.   Do not wear any contacts until this heals  May return to Eye [facility]  Please schedule a Follow-up Appointment to: Return in about 6 months (around 10/31/2022) for 6 month follow-up.  If you have any other questions or concerns, please feel free to call the office or send a message through MyChart. You may also schedule an earlier appointment if necessary.  Additionally, you may be receiving a survey about your experience at our office within a few days to 1 week by e-mail or mail. We value your feedback.  Saralyn Pilar, DO [facility], [place]

## 2022-05-01 NOTE — Progress Notes (Unsigned)
Subjective:    Patient ID: Ashley Fields, female    DOB: [birth year], 67 y.o.   MRN: [id number]  Ashley Fields is a 67 y.o. female presenting on 05/01/2022 for Annual Exam and Eye Problem (Bump on right eyelid)   HPI  Here for Annual Physical and Lab Review  Obesity BMI >34 Significant weight loss on Tirzepatide injections, she is ordering. Improved appetite suppression Uses "[employer]" Last A1c 4 range  Vitamin B12 Last lab 1 month >7500  HYPERLIPIDEMIA: - Reports no concerns. Last lipid panel 04/2021, controlled  - Currently taking Zetia, Rosuvastatin , tolerating well without side effects or myalgias   Osteopenia Orthopedics will do the yearly injection   Anemia Last lab shows improved anemia panel. Normal ferritin and iron reserves. Followed by Hematology w Iron Infusion every 6 months Check labs and anemia panel   Lumbar Spine DDD S/p L5-S1 Anterior Spinal Fusion, per Dr Marikay Alar - significant improvement overall.  R eye stye Recently treated with Polytrim antibiotics 4/8, did not fully resolve Still has R upper lid lower external hordeolum Followed by Leticia Penna -Optometry  History of Hep C Last Liver US done 05/04/20, negative.  Thyroid Nodule, L side 7mm incidental finding 11/2021  Health Maintenance:  Completed Mammogram 09/05/21  Colonoscopy 06/13/20, Dr Maximino Greenland AGI, next due 10 years. Reconsider Cologuard  Past Surgical History:  Procedure Laterality Date   5 miscarriages     1977-1985, Blood transfusion s/p miscarriage 1977   ABDOMINAL EXPOSURE N/A 12/18/2021   Procedure: ABDOMINAL EXPOSURE;  Surgeon: Cephus Shelling, MD;  Location: [facility] OR;  Service: Vascular;  Laterality: N/A;   ABDOMINAL HYSTERECTOMY  1987   Total   ANTERIOR LUMBAR FUSION N/A 12/18/2021   Procedure: Anterior Lumbar Interbody Fusion  - Lumbar Five-Sacral One;  Surgeon: Tia Alert, MD;  Location: [facility] OR;  Service: Neurosurgery;  Laterality: N/A;    APPENDECTOMY  12/2008   AUGMENTATION MAMMAPLASTY Bilateral 2015   Bilat   AUGMENTATION MAMMAPLASTY Bilateral 2018   implants redone w/ placement of implants under muscle   breast lift bilateral, implants  05/18/2015   bilateral, silicon naturel   BREAST REDUCTION SURGERY Bilateral 1997   CESAREAN SECTION  07/27/1981   Placenta Previa   CHOLECYSTECTOMY  03/2004   Lap surgery   COLONOSCOPY WITH PROPOFOL N/A 06/13/2020   Procedure: COLONOSCOPY WITH PROPOFOL;  Surgeon: Pasty Spillers, MD;  Location: [facility] ENDOSCOPY;  Service: Endoscopy;  Laterality: N/A;   COSMETIC SURGERY     Breast implants w/ lift, tummy tuck, upper & lower Blepharop   ESOPHAGOGASTRODUODENOSCOPY (EGD) WITH PROPOFOL N/A 06/13/2020   Procedure: ESOPHAGOGASTRODUODENOSCOPY (EGD) WITH PROPOFOL;  Surgeon: Pasty Spillers, MD;  Location: [facility] ENDOSCOPY;  Service: Endoscopy;  Laterality: N/A;   EYE SURGERY  2017   Cataract surgery & lasik   GASTRIC BYPASS  2005   Laparoscopic   IVC FILTER INSERTION  12/2003   TrapEase Vena Cava Filter   LUMBAR LAMINECTOMY  06/2006   L4-L5 (spinal fusion)   mini tummy tuck  05/07/2016   Bilateral bra/back roll lift skin removal   neck fusion     c3-c7 cadavier bones and metal plates placed per patient   neck surgery C3-C7  02/10/2015   ACDF   PROSTATE SURGERY     REDUCTION MAMMAPLASTY Bilateral 1997   SHOULDER ACROMIOPLASTY Left 03/27/2013   w/ labral debridement   SMALL INTESTINE SURGERY  12/24/2003   Lap Gastric Bypass   SPINAL CORD STIMULATOR IMPLANT  11/2010   removed 05/2011   SPINE SURGERY  2008 2017   Fusion L4-L5, ACDF C4-C7        05/01/2022    8:04 AM 01/15/2022   11:45 AM 12/27/2021    8:22 AM  Depression screen PHQ 2/9  Decreased Interest 0  1  Down, Depressed, Hopeless 0  0  PHQ - 2 Score 0  1  Altered sleeping   0  Tired, decreased energy   1  Change in appetite   0  Feeling bad or failure about yourself    0  Trouble concentrating   0  Moving  slowly or fidgety/restless   0  Suicidal thoughts   0  PHQ-9 Score   2  Difficult doing work/chores   Not difficult at all     Information is confidential and restricted. Go to Review Flowsheets to unlock data.    Past Medical History:  Diagnosis Date   Allergy    Anemia    Anxiety    Arthritis    Yes   Back pain    Blood transfusion without reported diagnosis 1977   Transfusions from miscarriage & hemorrhaging   Chronic kidney disease    Depression    Disp fx of cuboid bone of right foot, init for clos fx 01/01/2017   Fibromyalgia    GERD (gastroesophageal reflux disease)    Headache    Hepatitis C    Hyperlipidemia    Lumbar radiculopathy    Neck pain    Neuromuscular disorder 1997   Falling to much was an eye-opener   Osteoporosis    Thyroid disease    Urine incontinence    Past Surgical History:  Procedure Laterality Date   5 miscarriages     1977-1985, Blood transfusion s/p miscarriage 1977   ABDOMINAL EXPOSURE N/A 12/18/2021   Procedure: ABDOMINAL EXPOSURE;  Surgeon: Cephus Shelling, MD;  Location: [facility] OR;  Service: Vascular;  Laterality: N/A;   ABDOMINAL HYSTERECTOMY  1987   Total   ANTERIOR LUMBAR FUSION N/A 12/18/2021   Procedure: Anterior Lumbar Interbody Fusion  - Lumbar Five-Sacral One;  Surgeon: Tia Alert, MD;  Location: [facility] OR;  Service: Neurosurgery;  Laterality: N/A;   APPENDECTOMY  12/2008   AUGMENTATION MAMMAPLASTY Bilateral 2015   Bilat   AUGMENTATION MAMMAPLASTY Bilateral 2018   implants redone w/ placement of implants under muscle   breast lift bilateral, implants  05/18/2015   bilateral, silicon naturel   BREAST REDUCTION SURGERY Bilateral 1997   CESAREAN SECTION  07/27/1981   Placenta Previa   CHOLECYSTECTOMY  03/2004   Lap surgery   COLONOSCOPY WITH PROPOFOL N/A 06/13/2020   Procedure: COLONOSCOPY WITH PROPOFOL;  Surgeon: Pasty Spillers, MD;  Location: [facility] ENDOSCOPY;  Service: Endoscopy;  Laterality: N/A;   COSMETIC  SURGERY     Breast implants w/ lift, tummy tuck, upper & lower Blepharop   ESOPHAGOGASTRODUODENOSCOPY (EGD) WITH PROPOFOL N/A 06/13/2020   Procedure: ESOPHAGOGASTRODUODENOSCOPY (EGD) WITH PROPOFOL;  Surgeon: Pasty Spillers, MD;  Location: [facility] ENDOSCOPY;  Service: Endoscopy;  Laterality: N/A;   EYE SURGERY  2017   Cataract surgery & lasik   GASTRIC BYPASS  2005   Laparoscopic   IVC FILTER INSERTION  12/2003   TrapEase Vena Cava Filter   LUMBAR LAMINECTOMY  06/2006   L4-L5 (spinal fusion)   mini tummy tuck  05/07/2016   Bilateral bra/back roll lift skin removal   neck fusion     c3-c7 cadavier bones and  metal plates placed per patient   neck surgery C3-C7  02/10/2015   ACDF   PROSTATE SURGERY     REDUCTION MAMMAPLASTY Bilateral 1997   SHOULDER ACROMIOPLASTY Left 03/27/2013   w/ labral debridement   SMALL INTESTINE SURGERY  12/24/2003   Lap Gastric Bypass   SPINAL CORD STIMULATOR IMPLANT  11/2010   removed 05/2011   SPINE SURGERY  2008 2017   Fusion L4-L5, ACDF C4-C7   Social History   Socioeconomic History   Marital status: Married    Spouse name: Not on file   Number of children: 1   Years of education: High School   Highest education level: Some college, no degree  Occupational History   Occupation: disability  Tobacco Use   Smoking status: Former    Packs/day: 0.25    Years: 10.00    Additional pack years: 0.00    Total pack years: 2.50    Types: Cigarettes    Quit date: 11/29/1995    Years since quitting: 26.4    Passive exposure: Past   Smokeless tobacco: Former   Tobacco comments:    Quite 1997, didnt smoke hardly at all when I was smoking.  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]   Social Determinants of Health   Financial Resource Strain: Low Risk  (04/27/2022)   Overall Financial Resource Strain (CARDIA)    Difficulty of Paying Living Expenses: Not hard at all  Food Insecurity: No Food Insecurity (04/27/2022)   Hunger Vital Sign    Worried About Running Out of Food in the Last Year: Never true    Ran Out of Food in the Last Year: Never true  Transportation Needs: No Transportation Needs (04/27/2022)   PRAPARE - Administrator, Civil Service (Medical): No    Lack of Transportation (Non-Medical): No  Physical Activity: Inactive (04/27/2022)   Exercise Vital Sign    Days of Exercise per Week: 1 day    Minutes of Exercise per Session: 0 min  Stress: No Stress Concern Present (04/27/2022)   [employer] of Occupational Health - Occupational Stress Questionnaire    Feeling of Stress : Only a little  Social Connections: Unknown (04/27/2022)   Social Connection and Isolation Panel [NHANES]    Frequency of Communication with Friends and Family: More than three times a week    Frequency of Social Gatherings with Friends and Family: Once a week    Attends Religious Services: Patient declined    Database administrator or Organizations: No    Attends Engineer, structural: Not on file    Marital Status: Married  Catering manager Violence: Not At Risk (04/06/2021)   Humiliation, Afraid, Rape, and Kick questionnaire    Fear of Current or Ex-Partner: No    Emotionally Abused: No    Physically Abused: No    Sexually Abused: No   Family History  Problem Relation Age of Onset   COPD Mother    Lung cancer Mother    Diabetes Mother    Heart disease Mother    Stroke Mother  Heart attack Mother    Arthritis Mother    Cancer Mother    Hypertension Mother    Obesity Mother    Varicose Veins Mother    Heart disease Father    Heart attack Father 72    Early death Father    Depression Sister        x 5 sisters   COPD Sister    Hypertension Sister    Heart disease Brother        x 3 brothers   Diabetes Brother    Colon polyps Sister    Hearing loss Sister    Heart attack Sister    Heart attack Brother    Arthritis Sister    Varicose Veins Sister    Arthritis Brother    Heart disease Brother    Hypertension Brother    Cancer Maternal Grandfather    Stroke Maternal Grandfather    COPD Sister    Obesity Sister    Depression Sister    Depression Sister    Heart disease Sister    Hypertension Sister    Diabetes Brother    Heart disease Brother    Hypertension Brother    Obesity Brother    Varicose Veins Brother    Heart disease Brother    Hypertension Brother    Obesity Brother    Hypertension Sister    Obesity Sister    Miscarriages / Stillbirths Maternal Aunt    Miscarriages / Stillbirths Paternal Aunt    Breast cancer Neg Hx    Current Outpatient Medications on File Prior to Visit  Medication Sig   aspirin EC (ASPIRIN LOW DOSE) 81 MG tablet TAKE 1 TABLET BY MOUTH EVERY DAY   azelastine (ASTELIN) 0.1 % nasal spray Place 2 sprays into both nostrils 2 (two) times daily. Use in each nostril as directed   bisacodyl (DULCOLAX) 5 MG EC tablet Take 5 mg by mouth daily as needed for moderate constipation.   butalbital-acetaminophen-caffeine (FIORICET) 50-325-40 MG tablet Take 1 tablet by mouth 2 (two) times daily as needed for headache.   Cholecalciferol 250 MCG (10000 UT) CAPS Take 10,000 Units by mouth once a week.   cyanocobalamin (VITAMIN B12) 1000 MCG/ML injection Inject 1 mL (1,000 mcg total) into the muscle every 30 (thirty) days.   diclofenac Sodium (VOLTAREN) 1 % GEL Apply 2 g topically 4 (four) times daily as needed (pain).   estradiol (ESTRACE) 1 MG tablet TAKE 1 TABLET EVERY DAY (Patient taking differently: Take 1 mg by mouth daily.)   etodolac (LODINE) 500 MG tablet Take 1 tablet by mouth 2 (two) times daily.    ezetimibe (ZETIA) 10 MG tablet Take 1 tablet (10 mg total) by mouth daily.   fluticasone (FLONASE) 50 MCG/ACT nasal spray SPRAY 2 SPRAYS INTO EACH NOSTRIL EVERY DAY   furosemide (LASIX) 20 MG tablet Take 1 tablet (20 mg total) by mouth daily as needed for fluid or edema.   hydrOXYzine (ATARAX) 25 MG tablet Take 1 tablet (25 mg total) by mouth every 8 (eight) hours as needed for anxiety.   ondansetron (ZOFRAN ODT) 4 MG disintegrating tablet Take 1 tablet (4 mg total) by mouth every 8 (eight) hours as needed for nausea or vomiting.   Oxcarbazepine (TRILEPTAL) 300 MG tablet Take 1 tablet (300 mg total) by mouth 2 (two) times daily.   propranolol (INDERAL) 10 MG tablet Take 1 tablet (10 mg total) by mouth 3 (three) times daily as needed. For anxiety attacks   rizatriptan (  MAXALT-MLT) 10 MG disintegrating tablet Take 1 tablet (10 mg total) by mouth as needed for migraine. May repeat in 2 hours if needed, that is max dose for 24 hours.   rosuvastatin (CRESTOR) 40 MG tablet TAKE 1 TABLET BY MOUTH EVERY DAY   tiZANidine (ZANAFLEX) 4 MG tablet Take 1 tablet (4 mg total) by mouth 4 (four) times daily.   traZODone (DESYREL) 100 MG tablet Take 1-1.5 tablets (100-150 mg total) by mouth at bedtime as needed for sleep.   trimethoprim-polymyxin b (POLYTRIM) ophthalmic solution PLACE 1 DROP INTO THE RIGHT EYE EVERY 6 HOURS.   valACYclovir (VALTREX) 1000 MG tablet TAKE 1 TABLET (1,000 MG TOTAL) BY MOUTH DAILY. FOR 5-7 DAYS, MAY REPEAT IF NEED FOR COLD SORE   venlafaxine XR (EFFEXOR-XR) 75 MG 24 hr capsule Take 1 capsule (75 mg total) by mouth daily with breakfast.   Zoledronic Acid (ZOMETA) 4 MG/100ML IVPB Once per year from Orthopedic   No current facility-administered medications on file prior to visit.    Review of Systems  Constitutional:  Negative for activity change, appetite change, chills, diaphoresis, fatigue and fever.  HENT:  Negative for congestion and hearing loss.   Eyes:  Negative for visual  disturbance.  Respiratory:  Negative for cough, chest tightness, shortness of breath and wheezing.   Cardiovascular:  Negative for chest pain, palpitations and leg swelling.  Gastrointestinal:  Negative for abdominal pain, constipation, diarrhea, nausea and vomiting.  Genitourinary:  Negative for dysuria, frequency and hematuria.  Musculoskeletal:  Positive for arthralgias. Negative for neck pain.  Skin:  Negative for rash.  Neurological:  Negative for dizziness, weakness, light-headedness, numbness and headaches.  Hematological:  Negative for adenopathy.  Psychiatric/Behavioral:  Negative for behavioral problems, dysphoric mood and sleep disturbance.    Per HPI unless specifically indicated above     Objective:    BP 127/79   Pulse 78   Ht 5\' 2"  (1.575 m)   Wt 189 lb (85.7 kg)   BMI 34.57 kg/m   Wt Readings from Last 3 Encounters:  05/01/22 189 lb (85.7 kg)  03/19/22 200 lb 3.2 oz (90.8 kg)  03/16/22 206 lb (93.4 kg)    Physical Exam Vitals and nursing note reviewed.  Constitutional:      General: She is not in acute distress.    Appearance: She is well-developed. She is not diaphoretic.     Comments: Well-appearing, comfortable, cooperative  HENT:     Head: Normocephalic and atraumatic.  Eyes:     General:        Right eye: No discharge.        Left eye: No discharge.     Conjunctiva/sclera: Conjunctivae normal.     Pupils: Pupils are equal, round, and reactive to light.     Comments: Right upper eyelid medial aspect with external hordeolum some localized redness firm nodular density, no extending erythema  Neck:     Thyroid: No thyromegaly.  Cardiovascular:     Rate and Rhythm: Normal rate and regular rhythm.     Pulses: Normal pulses.     Heart sounds: Normal heart sounds. No murmur heard. Pulmonary:     Effort: Pulmonary effort is normal. No respiratory distress.     Breath sounds: Normal breath sounds. No wheezing or rales.  Abdominal:     General: Bowel  sounds are normal. There is no distension.     Palpations: Abdomen is soft. There is no mass.     Tenderness: There is no  abdominal tenderness.  Musculoskeletal:        General: No tenderness. Normal range of motion.     Cervical back: Normal range of motion and neck supple.     Comments: Upper / Lower Extremities: - Normal muscle tone, strength bilateral upper extremities 5/5, lower extremities 5/5  Lymphadenopathy:     Cervical: No cervical adenopathy.  Skin:    General: Skin is warm and dry.     Findings: No erythema or rash.  Neurological:     Mental Status: She is alert and oriented to person, place, and time.     Comments: Distal sensation intact to light touch all extremities  Psychiatric:        Mood and Affect: Mood normal.        Behavior: Behavior normal.        Thought Content: Thought content normal.     Comments: Well groomed, good eye contact, normal speech and thoughts    Results for orders placed or performed in visit on 03/07/22  VITAMIN D 25 Hydroxy (Vit-D Deficiency, Fractures)  Result Value Ref Range   Vit D, 25-Hydroxy 93.33 30 - 100 ng/mL  Vitamin B12  Result Value Ref Range   Vitamin B-12 >7,500 (H) 180 - 914 pg/mL  Ferritin  Result Value Ref Range   Ferritin 25 11 - 307 ng/mL  Basic Metabolic Panel - Cancer Center Only  Result Value Ref Range   Sodium 138 135 - 145 mmol/L   Potassium 4.1 3.5 - 5.1 mmol/L   Chloride 103 98 - 111 mmol/L   CO2 26 22 - 32 mmol/L   Glucose, Bld 136 (H) 70 - 99 mg/dL   BUN 18 8 - 23 mg/dL   Creatinine 4.09 8.11 - 1.00 mg/dL   Calcium 8.8 (L) 8.9 - 10.3 mg/dL   GFR, Estimated >91 >47 mL/min   Anion gap 9 5 - 15  CBC with Differential (Cancer Center Only)  Result Value Ref Range   WBC Count 6.2 4.0 - 10.5 K/uL   RBC 4.33 3.87 - 5.11 MIL/uL   Hemoglobin 12.1 12.0 - 15.0 g/dL   HCT 82.9 56.2 - 13.0 %   MCV 91.2 80.0 - 100.0 fL   MCH 27.9 26.0 - 34.0 pg   MCHC 30.6 30.0 - 36.0 g/dL   RDW 86.5 78.4 - 69.6 %    Platelet Count 179 150 - 400 K/uL   nRBC 0.0 0.0 - 0.2 %   Neutrophils Relative % 61 %   Neutro Abs 3.8 1.7 - 7.7 K/uL   Lymphocytes Relative 27 %   Lymphs Abs 1.7 0.7 - 4.0 K/uL   Monocytes Relative 6 %   Monocytes Absolute 0.4 0.1 - 1.0 K/uL   Eosinophils Relative 5 %   Eosinophils Absolute 0.3 0.0 - 0.5 K/uL   Basophils Relative 1 %   Basophils Absolute 0.0 0.0 - 0.1 K/uL   Immature Granulocytes 0 %   Abs Immature Granulocytes 0.02 0.00 - 0.07 K/uL  Iron and TIBC  Result Value Ref Range   Iron 77 28 - 170 ug/dL   TIBC 295 284 - 132 ug/dL   Saturation Ratios 19 10.4 - 31.8 %   UIBC 340 ug/dL      Assessment & Plan:   Problem List Items Addressed This Visit     Chronic Low back pain (Primary Area of Pain) (Bilateral)  (R>L) (Chronic)   Relevant Medications   gabapentin (NEURONTIN) 600 MG tablet   Chronic pain  syndrome (Chronic)   Relevant Medications   gabapentin (NEURONTIN) 600 MG tablet   Mixed hyperlipidemia   Relevant Orders   Lipid panel (Completed)   TSH (Completed)   PAD (peripheral artery disease)    Stable chronic problem On Statin therapy + Zetia      Relevant Orders   COMPLETE METABOLIC PANEL WITH GFR (Completed)   Lipid panel (Completed)   S/P lumbar fusion    Improved chronic pain following spine surgery [facility] Dr Yetta Barre Improved pain and function On medication currently      Thyroid nodule    Incidental finding 11/2021, < 7 mm not indicated for surveillance or biopsy Can consider repeat imaging with [place] in future as indicated      Relevant Orders   TSH (Completed)   Other Visit Diagnoses     Annual physical exam    -  Primary   Relevant Orders   COMPLETE METABOLIC PANEL WITH GFR (Completed)   CBC with Differential/Platelet (Completed)   Hemoglobin A1c (Completed)   Lipid panel (Completed)   Iron deficiency anemia, unspecified iron deficiency anemia type       Relevant Orders   COMPLETE METABOLIC PANEL WITH GFR  (Completed)   CBC with Differential/Platelet (Completed)   Iron, TIBC and Ferritin Panel (Completed)   Vitamin B12 deficiency       Relevant Orders   Vitamin B12 (Completed)   Elevated hemoglobin A1c       Relevant Orders   Hemoglobin A1c (Completed)   Gastro-esophageal reflux disease without esophagitis       Relevant Medications   omeprazole (PRILOSEC) 40 MG capsule   Hordeolum externum of right upper eyelid       Relevant Medications   amoxicillin-clavulanate (AUGMENTIN) 875-125 MG tablet   Antibiotic-induced yeast infection       Relevant Medications   fluconazole (DIFLUCAN) 150 MG tablet       Updated Health Maintenance information Fasting labs ordered today. Pending. Encouraged improvement to lifestyle with diet and exercise Goal of weight loss  Next Hematology 09/2022. We will check the labs required.  Mammogram due Fall 2024. She will schedule.  R Eye Ext Hordeolum Failed antibiotic eye drops Trial on continued warm compresses Add oral agent Augmentin If not improving, advised to return to Optometry and may warrant further treatment.  Orders Placed This Encounter  Procedures   COMPLETE METABOLIC PANEL WITH GFR   CBC with Differential/Platelet   Hemoglobin A1c   Lipid panel    Order Specific Question:   Has the patient fasted?    Answer:   Yes   Vitamin B12   TSH   Iron, TIBC and Ferritin Panel     Meds ordered this encounter  Medications   gabapentin (NEURONTIN) 600 MG tablet    Sig: Take 1 tablet (600 mg total) by mouth in the morning, at noon, in the evening, and at bedtime.    Dispense:  360 tablet    Refill:  3   omeprazole (PRILOSEC) 40 MG capsule    Sig: Take 1 capsule (40 mg total) by mouth daily.    Dispense:  90 capsule    Refill:  3    Add refills   amoxicillin-clavulanate (AUGMENTIN) 875-125 MG tablet    Sig: Take 1 tablet by mouth 2 (two) times daily.    Dispense:  20 tablet    Refill:  0   fluconazole (DIFLUCAN) 150 MG tablet     Sig: Take one tablet  by mouth on Day 1. Repeat dose 2nd tablet on Day 3.    Dispense:  2 tablet    Refill:  0      Follow up plan: Return in about 6 months (around 10/31/2022) for 6 month follow-up.  Saralyn Pilar, DO [facility] 05/01/2022, 8:11 AM

## 2022-05-02 LAB — CBC WITH DIFFERENTIAL/PLATELET
Absolute Monocytes: 454 cells/uL (ref 200–950)
Basophils Absolute: 32 cells/uL (ref 0–200)
Basophils Relative: 0.5 %
Eosinophils Absolute: 214 cells/uL (ref 15–500)
HCT: 45 % (ref 35.0–45.0)
Hemoglobin: 14.4 g/dL (ref 11.7–15.5)
MCH: 28.2 pg (ref 27.0–33.0)
MPV: 10.2 fL (ref 7.5–12.5)
Monocytes Relative: 7.2 %
Neutro Abs: 4334 cells/uL (ref 1500–7800)
Platelets: 169 10*3/uL (ref 140–400)
RBC: 5.11 10*6/uL — ABNORMAL HIGH (ref 3.80–5.10)
Total Lymphocyte: 20.1 %

## 2022-05-02 LAB — COMPLETE METABOLIC PANEL WITH GFR
AG Ratio: 1.8 (calc) (ref 1.0–2.5)
ALT: 13 U/L (ref 6–29)
AST: 22 U/L (ref 10–35)
Albumin: 4.6 g/dL (ref 3.6–5.1)
Alkaline phosphatase (APISO): 65 U/L (ref 37–153)
Chloride: 103 mmol/L (ref 98–110)
Globulin: 2.6 g/dL (calc) (ref 1.9–3.7)
Glucose, Bld: 86 mg/dL (ref 65–99)
Potassium: 4.9 mmol/L (ref 3.5–5.3)
Total Bilirubin: 0.7 mg/dL (ref 0.2–1.2)
Total Protein: 7.2 g/dL (ref 6.1–8.1)

## 2022-05-02 LAB — HEMOGLOBIN A1C
Hgb A1c MFr Bld: 5.4 % of total Hgb (ref <5.7)
Mean Plasma Glucose: 108 mg/dL
eAG (mmol/L): 6 mmol/L

## 2022-05-02 LAB — LIPID PANEL
Cholesterol: 133 mg/dL (ref <200)
LDL Cholesterol (Calc): 43 mg/dL (calc)
Non-HDL Cholesterol (Calc): 64 mg/dL (calc) (ref <130)
Total CHOL/HDL Ratio: 1.9 (calc) (ref <5.0)
Triglycerides: 130 mg/dL (ref <150)

## 2022-05-02 LAB — TSH: TSH: 2.11 mIU/L (ref 0.40–4.50)

## 2022-05-02 LAB — IRON,TIBC AND FERRITIN PANEL
%SAT: 17 % (calc) (ref 16–45)
Ferritin: 65 ng/mL (ref 16–288)
Iron: 62 ug/dL (ref 45–160)
TIBC: 363 mcg/dL (calc) (ref 250–450)

## 2022-05-02 LAB — VITAMIN B12: Vitamin B-12: 463 pg/mL (ref 200–1100)

## 2022-05-02 NOTE — Assessment & Plan Note (Signed)
Stable chronic problem ?On Statin therapy + Zetia ?

## 2022-05-02 NOTE — Assessment & Plan Note (Addendum)
Incidental finding 11/2021, < 7 mm not indicated for surveillance or biopsy Can consider repeat imaging with [place] in future as indicated

## 2022-05-02 NOTE — Assessment & Plan Note (Signed)
Improved chronic pain following spine surgery [facility] Dr Yetta Barre Improved pain and function On medication currently

## 2022-05-04 ENCOUNTER — Encounter: Payer: Self-pay | Admitting: Family Medicine

## 2022-05-15 ENCOUNTER — Encounter: Payer: Self-pay | Admitting: Psychiatry

## 2022-05-15 ENCOUNTER — Telehealth (INDEPENDENT_AMBULATORY_CARE_PROVIDER_SITE_OTHER): Payer: Medicare HMO | Admitting: Psychiatry

## 2022-05-15 DIAGNOSIS — F4310 Post-traumatic stress disorder, unspecified: Secondary | ICD-10-CM

## 2022-05-15 DIAGNOSIS — F410 Panic disorder [episodic paroxysmal anxiety] without agoraphobia: Secondary | ICD-10-CM

## 2022-05-15 DIAGNOSIS — F3178 Bipolar disorder, in full remission, most recent episode mixed: Secondary | ICD-10-CM | POA: Diagnosis not present

## 2022-05-15 DIAGNOSIS — G4701 Insomnia due to medical condition: Secondary | ICD-10-CM

## 2022-05-15 NOTE — Progress Notes (Signed)
Virtual Visit via Video Note  I connected with Ashley Fields on 05/15/22 at  8:30 AM EDT by a video enabled telemedicine application and verified that I am speaking with the correct person using two identifiers.  Location Provider Location : [facility] Patient Location : Home  Participants: Patient , Provider    I discussed the limitations of evaluation and management by telemedicine and the availability of in person appointments. The patient expressed understanding and agreed to proceed.   I discussed the assessment and treatment plan with the patient. The patient was provided an opportunity to ask questions and all were answered. The patient agreed with the plan and demonstrated an understanding of the instructions.   The patient was advised to call back or seek an in-person evaluation if the symptoms worsen or if the condition fails to improve as anticipated.    BH MD OP Progress Note  05/15/2022 1:05 PM Ashley Fields  MRN:  [id number]  Chief Complaint:  Chief Complaint  Patient presents with   Follow-up   Medication Refill   Anxiety   Depression   HPI: Ashley Fields is a 67 year old Caucasian female, married, on disability, lives in [facility], has a history of bipolar disorder, PTSD, chronic pain, migraine headaches, history of laparoscopic gastric bypass was evaluated by telemedicine today.  Patient today reports she is currently improving with regards to her mood symptoms.  Reports irritability has improved.  She is currently taking the higher dosage of oxcarbazepine and tolerating it well.  Patient reports sleep has improved.  She feels rested when she wakes up in the morning.  She continues to lose weight, excited about that.  She reports she is eating enough for herself, watching her diet.  Patient reports she does have multiple psychosocial stressors including her husband who is currently struggling with health problems and also legal problems due to fighting for benefits   continuation at his previous work.  All that has been stressful.  Patient however reports she has been coping okay given the situational stressors.  She had a recent fall and sprained her ankle.  She reports it is getting better.  Continues to have back pain.  Currently on pain management.  Patient denies any suicidality, homicidality or perceptual disturbances.  Patient denies any other concerns today.  Visit Diagnosis:    ICD-10-CM   1. Bipolar disorder, in full remission, most recent episode mixed (HCC)  F31.78    moderate    2. PTSD (post-traumatic stress disorder)  F43.10     3. Panic disorder  F41.0     4. Insomnia due to medical condition  G47.01    Pain, lack of sleep hygiene.      Past Psychiatric History: I have reviewed past psychiatric history from progress note on 05/15/2018.  Past trials of Prozac, Wellbutrin, Lexapro, Cymbalta, Rexulti, Ambien-nightmares, Lunesta, Lamictal, risperidone.  Past Medical History:  Past Medical History:  Diagnosis Date   Allergy    Anemia    Anxiety    Arthritis    Yes   Back pain    Blood transfusion without reported diagnosis 1977   Transfusions from miscarriage & hemorrhaging   Chronic kidney disease    Depression    Disp fx of cuboid bone of right foot, init for clos fx 01/01/2017   Fibromyalgia    GERD (gastroesophageal reflux disease)    Headache    Hepatitis C    Hyperlipidemia    Lumbar radiculopathy    Neck pain  Neuromuscular disorder (HCC) 1997   Falling to much was an eye-opener   Osteoporosis    Thyroid disease    Urine incontinence     Past Surgical History:  Procedure Laterality Date   5 miscarriages     1977-1985, Blood transfusion s/p miscarriage 1977   ABDOMINAL EXPOSURE N/A 12/18/2021   Procedure: ABDOMINAL EXPOSURE;  Surgeon: Cephus Shelling, MD;  Location: [facility] OR;  Service: Vascular;  Laterality: N/A;   ABDOMINAL HYSTERECTOMY  1987   Total   ANTERIOR LUMBAR FUSION N/A 12/18/2021   Procedure:  Anterior Lumbar Interbody Fusion  - Lumbar Five-Sacral One;  Surgeon: Tia Alert, MD;  Location: [facility] OR;  Service: Neurosurgery;  Laterality: N/A;   APPENDECTOMY  12/2008   AUGMENTATION MAMMAPLASTY Bilateral 2015   Bilat   AUGMENTATION MAMMAPLASTY Bilateral 2018   implants redone w/ placement of implants under muscle   breast lift bilateral, implants  05/18/2015   bilateral, silicon naturel   BREAST REDUCTION SURGERY Bilateral 1997   CESAREAN SECTION  07/27/1981   Placenta Previa   CHOLECYSTECTOMY  03/2004   Lap surgery   COLONOSCOPY WITH PROPOFOL N/A 06/13/2020   Procedure: COLONOSCOPY WITH PROPOFOL;  Surgeon: Pasty Spillers, MD;  Location: [facility] ENDOSCOPY;  Service: Endoscopy;  Laterality: N/A;   COSMETIC SURGERY     Breast implants w/ lift, tummy tuck, upper & lower Blepharop   ESOPHAGOGASTRODUODENOSCOPY (EGD) WITH PROPOFOL N/A 06/13/2020   Procedure: ESOPHAGOGASTRODUODENOSCOPY (EGD) WITH PROPOFOL;  Surgeon: Pasty Spillers, MD;  Location: [facility] ENDOSCOPY;  Service: Endoscopy;  Laterality: N/A;   EYE SURGERY  2017   Cataract surgery & lasik   GASTRIC BYPASS  2005   Laparoscopic   IVC FILTER INSERTION  12/2003   TrapEase Vena Cava Filter   LUMBAR LAMINECTOMY  06/2006   L4-L5 (spinal fusion)   mini tummy tuck  05/07/2016   Bilateral bra/back roll lift skin removal   neck fusion     c3-c7 cadavier bones and metal plates placed per patient   neck surgery C3-C7  02/10/2015   ACDF   PROSTATE SURGERY     REDUCTION MAMMAPLASTY Bilateral 1997   SHOULDER ACROMIOPLASTY Left 03/27/2013   w/ labral debridement   SMALL INTESTINE SURGERY  12/24/2003   Lap Gastric Bypass   SPINAL CORD STIMULATOR IMPLANT  11/2010   removed 05/2011   SPINE SURGERY  2008 2017   Fusion L4-L5, ACDF C4-C7    Family Psychiatric History: I have reviewed family psychiatric history from progress note on 05/15/2018.  Family History:  Family History  Problem Relation Age of Onset   COPD Mother     Lung cancer Mother    Diabetes Mother    Heart disease Mother    Stroke Mother    Heart attack Mother    Arthritis Mother    Cancer Mother    Hypertension Mother    Obesity Mother    Varicose Veins Mother    Heart disease Father    Heart attack Father 86   Early death Father    Depression Sister        x 5 sisters   COPD Sister    Hypertension Sister    Heart disease Brother        x 3 brothers   Diabetes Brother    Colon polyps Sister    Hearing loss Sister    Heart attack Sister    Heart attack Brother    Arthritis Sister    Varicose Veins  Sister    Arthritis Brother    Heart disease Brother    Hypertension Brother    Cancer Maternal Grandfather    Stroke Maternal Grandfather    COPD Sister    Obesity Sister    Depression Sister    Depression Sister    Heart disease Sister    Hypertension Sister    Diabetes Brother    Heart disease Brother    Hypertension Brother    Obesity Brother    Varicose Veins Brother    Heart disease Brother    Hypertension Brother    Obesity Brother    Hypertension Sister    Obesity Sister    Miscarriages / Stillbirths Maternal Aunt    Miscarriages / Stillbirths Paternal Aunt    Breast cancer Neg Hx     Social History: I have reviewed social history from progress note on 05/15/2018. Social History   Socioeconomic History   Marital status: Married    Spouse name: Not on file   Number of children: 1   Years of education: High School   Highest education level: Some college, no degree  Occupational History   Occupation: disability  Tobacco Use   Smoking status: Former    Packs/day: 0.25    Years: 10.00    Additional pack years: 0.00    Total pack years: 2.50    Types: Cigarettes    Quit date: 11/29/1995    Years since quitting: 26.4    Passive exposure: Past   Smokeless tobacco: Former   Tobacco comments:    Quite 1997, didnt smoke hardly at all when I was smoking.  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]   Social Determinants of Health   Financial Resource Strain: Low Risk  (04/27/2022)   Overall Financial Resource Strain (CARDIA)    Difficulty of Paying Living Expenses: Not hard at all  Food Insecurity: No Food Insecurity (04/27/2022)   Hunger Vital Sign    Worried About Running Out of Food in the Last Year: Never true    Ran Out of Food in the Last Year: Never true  Transportation Needs: No Transportation Needs (04/27/2022)   PRAPARE - Administrator, Civil Service (Medical): No    Lack of Transportation (Non-Medical): No  Physical Activity: Inactive (04/27/2022)   Exercise Vital Sign    Days of Exercise per Week: 1 day    Minutes of Exercise per Session: 0 min  Stress: No Stress Concern Present (04/27/2022)   [employer] of Occupational Health - Occupational Stress Questionnaire    Feeling of Stress : Only a little  Social Connections: Unknown (04/27/2022)   Social Connection and Isolation Panel [NHANES]    Frequency of Communication with Friends and Family: More than three times a week    Frequency of Social Gatherings with Friends and Family: Once a week    Attends Religious Services: Patient declined    Database administrator or Organizations: No    Attends Engineer, structural: Not on file    Marital Status: Married    Allergies:  Allergies  Allergen Reactions   Flagyl [Metronidazole] Anaphylaxis   Cymbalta [Duloxetine Hcl] Anxiety   Tape Rash    Paper tape okay to use per patient.    Metabolic Disorder Labs: Lab Results  Component Value Date   HGBA1C 5.4 05/01/2022   MPG 108 05/01/2022   MPG  91 04/28/2021   No results found for: "PROLACTIN" Lab Results  Component Value Date   CHOL 133 05/01/2022   TRIG 130 05/01/2022   HDL 69 05/01/2022   CHOLHDL 1.9 05/01/2022   LDLCALC 43 05/01/2022   LDLCALC 40 04/28/2021   Lab Results  Component Value Date   TSH 2.11 05/01/2022   TSH 3.29 04/28/2021    Therapeutic Level Labs: No results found for: "LITHIUM" No results found for: "VALPROATE" No results found for: "CBMZ"  Current Medications: Current Outpatient Medications  Medication Sig Dispense Refill   oxyCODONE-acetaminophen (PERCOCET) 7.5-325 MG tablet Take 1 tablet by mouth every 6 (six) hours as needed for severe pain.     amoxicillin-clavulanate (AUGMENTIN) 875-125 MG tablet Take 1 tablet by mouth 2 (two) times daily. 20 tablet 0   aspirin EC (ASPIRIN LOW DOSE) 81 MG tablet TAKE 1 TABLET BY MOUTH EVERY DAY 90 tablet 0   azelastine (ASTELIN) 0.1 % nasal spray Place 2 sprays into both nostrils 2 (two) times daily. Use in each nostril as directed 30 mL 11   bisacodyl (DULCOLAX) 5 MG EC tablet Take 5 mg by mouth daily as needed for moderate constipation.     butalbital-acetaminophen-caffeine (FIORICET) 50-325-40 MG tablet Take 1 tablet by mouth 2 (two) times daily as needed for headache.     Cholecalciferol 250 MCG (10000 UT) CAPS Take 10,000 Units by mouth once a week.     cyanocobalamin (VITAMIN B12) 1000 MCG/ML injection Inject 1 mL (1,000 mcg total) into the muscle every 30 (thirty) days. 1 mL 11   diclofenac Sodium (VOLTAREN) 1 % GEL Apply 2 g topically 4 (four) times daily as needed (pain).     estradiol (ESTRACE) 1 MG tablet TAKE 1 TABLET EVERY DAY (Patient taking differently: Take 1 mg by mouth daily.) 90 tablet 1   etodolac (LODINE) 500 MG tablet Take 1 tablet by mouth 2 (two) times daily.     ezetimibe (ZETIA) 10 MG tablet Take 1 tablet (10 mg total) by mouth daily. 90 tablet 3   fluconazole (DIFLUCAN) 150 MG tablet Take one tablet by mouth on Day 1. Repeat dose 2nd  tablet on Day 3. 2 tablet 0   fluticasone (FLONASE) 50 MCG/ACT nasal spray SPRAY 2 SPRAYS INTO EACH NOSTRIL EVERY DAY 48 mL 0   furosemide (LASIX) 20 MG tablet Take 1 tablet (20 mg total) by mouth daily as needed for fluid or edema. 90 tablet 1   gabapentin (NEURONTIN) 600 MG tablet Take 1 tablet (600 mg total) by mouth in the morning, at noon, in the evening, and at bedtime. 360 tablet 3   hydrOXYzine (ATARAX) 25 MG tablet Take 1 tablet (25 mg total) by mouth every 8 (eight) hours as needed for anxiety. 270 tablet 1   omeprazole (PRILOSEC) 40 MG capsule Take 1 capsule (40 mg total) by mouth daily. 90 capsule 3   ondansetron (ZOFRAN ODT) 4 MG disintegrating tablet Take 1 tablet (4 mg total) by mouth every 8 (eight) hours as needed for nausea or vomiting. 30 tablet 2   Oxcarbazepine (TRILEPTAL) 300 MG tablet Take 1 tablet (300 mg total) by mouth 2 (two) times daily. 180 tablet 0  propranolol (INDERAL) 10 MG tablet Take 1 tablet (10 mg total) by mouth 3 (three) times daily as needed. For anxiety attacks 270 tablet 0   rizatriptan (MAXALT-MLT) 10 MG disintegrating tablet Take 1 tablet (10 mg total) by mouth as needed for migraine. May repeat in 2 hours if needed, that is max dose for 24 hours. 10 tablet 3   rosuvastatin (CRESTOR) 40 MG tablet TAKE 1 TABLET BY MOUTH EVERY DAY 90 tablet 0   TIRZEPATIDE [place] Inject into the skin.     tiZANidine (ZANAFLEX) 4 MG tablet Take 1 tablet (4 mg total) by mouth 4 (four) times daily. 360 tablet 1   traZODone (DESYREL) 100 MG tablet Take 1-1.5 tablets (100-150 mg total) by mouth at bedtime as needed for sleep. 135 tablet 0   trimethoprim-polymyxin b (POLYTRIM) ophthalmic solution PLACE 1 DROP INTO THE RIGHT EYE EVERY 6 HOURS. 10 mL 0   valACYclovir (VALTREX) 1000 MG tablet TAKE 1 TABLET (1,000 MG TOTAL) BY MOUTH DAILY. FOR 5-7 DAYS, MAY REPEAT IF NEED FOR COLD SORE 90 tablet 1   venlafaxine XR (EFFEXOR-XR) 75 MG 24 hr capsule Take 1 capsule (75 mg total) by mouth  daily with breakfast. 90 capsule 1   Zoledronic Acid (ZOMETA) 4 MG/100ML IVPB Once per year from Orthopedic 100 mL    No current facility-administered medications for this visit.     Musculoskeletal: Strength & Muscle Tone:  UTA Gait & Station:  Seated Patient leans: N/A  Psychiatric Specialty Exam: Review of Systems  Musculoskeletal:  Positive for back pain.       Rt.ankle sprain - pain - improving  Psychiatric/Behavioral:  The patient is nervous/anxious (coping well).   All other systems reviewed and are negative.   There were no vitals taken for this visit.There is no height or weight on file to calculate BMI.  General Appearance: Casual  Eye Contact:  Fair  Speech:  Clear and Coherent  Volume:  Normal  Mood:  Anxious  Affect:  Appropriate  Thought Process:  Goal Directed and Descriptions of Associations: Intact  Orientation:  Full (Time, Place, and Person)  Thought Content: Logical   Suicidal Thoughts:  No  Homicidal Thoughts:  No  Memory:  Immediate;   Fair Recent;   Fair Remote;   Fair  Judgement:  Fair  Insight:  Fair  Psychomotor Activity:  Normal  Concentration:  Concentration: Fair and Attention Span: Fair  Recall:  [employer] of Knowledge: Fair  Language: Fair  Akathisia:  No  Handed:  Right  AIMS (if indicated): not done  Assets:  Communication Skills Desire for Improvement Housing Intimacy Social Support  ADL's:  Intact  Cognition: WNL  Sleep:   improving   Screenings: AIMS    Flowsheet Row Video Visit from 06/13/2021 in [facility] Video Visit from 05/16/2021 in [facility]  AIMS Total Score 0 0      GAD-7    Flowsheet Row Office Visit from 05/01/2022 in [facility] Video Visit from 01/15/2022 in [facility] Office Visit from 12/27/2021 in [facility] Office Visit from  11/09/2021 in [facility] Video Visit from 10/24/2021 in [facility]  Total GAD-7 Score 0 9 0 0 5      PHQ2-9    Flowsheet Row [facility] Visit [facility] 05/01/2022 in [facility]  Video Visit from 01/15/2022 in [facility] Office Visit from 12/27/2021 in [facility] Office Visit from 11/09/2021 in [facility] Video Visit from 10/24/2021 in [facility]  PHQ-2 Total Score 0 2 1 3  0  PHQ-9 Total Score -- 6 2 3 4       Flowsheet Row Video Visit from 05/15/2022 in [facility] Video Visit from 04/03/2022 in [facility] Video Visit from 02/20/2022 in [facility]  C-SSRS RISK CATEGORY No Risk No Risk No Risk        Assessment and Plan: Ashley Fields is a 67 year old Caucasian female who has a history of PTSD, bipolar disorder, panic disorder, migraine headaches was evaluated by telemedicine today.  Patient is currently improving.  Plan as noted below.  Plan Bipolar disorder type I mixed moderate in full remission Trileptal 300 mg p.o. twice daily Gabapentin 600 mg p.o. 3 times daily-prescribed by primary care provider for pain  PTSD-stable Venlafaxine 75 mg p.o. daily  Panic disorder-improving Patient was referred for CBT-declined Continue venlafaxine as prescribed  Insomnia-improving Trazodone 100-150 mg p.o. nightly Patient will continue to need sufficient pain management  Follow-up in clinic in 3 to 4 months or sooner in person.    Collaboration of Care: Collaboration of Care: Other patient advised to continue pain management.  Patient/Guardian was advised Release of Information must be obtained prior to any record release in order to collaborate  their care with an outside provider. Patient/Guardian was advised if they have not already done so to contact the registration department to sign all necessary forms in order for [place] to release information regarding their care.   Consent: Patient/Guardian gives verbal consent for treatment and assignment of benefits for services provided during this visit. Patient/Guardian expressed understanding and agreed to proceed.   This note was generated in part or whole with voice recognition software. Voice recognition is usually quite accurate but there are transcription errors that can and very often do occur. I apologize for any typographical errors that were not detected and corrected.    Jomarie Longs, MD 05/15/2022, 1:05 PM

## 2022-05-16 ENCOUNTER — Encounter: Payer: Self-pay | Admitting: Family Medicine

## 2022-05-16 DIAGNOSIS — R232 Flushing: Secondary | ICD-10-CM

## 2022-05-16 DIAGNOSIS — E8941 Symptomatic postprocedural ovarian failure: Secondary | ICD-10-CM

## 2022-05-16 DIAGNOSIS — R5383 Other fatigue: Secondary | ICD-10-CM

## 2022-05-17 NOTE — Addendum Note (Signed)
Addended by: Smitty Cords on: 05/17/2022 09:07 AM   Modules accepted: Orders

## 2022-05-22 DIAGNOSIS — S3210XD Unspecified fracture of sacrum, subsequent encounter for fracture with routine healing: Secondary | ICD-10-CM | POA: Diagnosis not present

## 2022-05-22 DIAGNOSIS — M5416 Radiculopathy, lumbar region: Secondary | ICD-10-CM | POA: Diagnosis not present

## 2022-05-22 DIAGNOSIS — Z6833 Body mass index (BMI) 33.0-33.9, adult: Secondary | ICD-10-CM | POA: Diagnosis not present

## 2022-05-23 ENCOUNTER — Encounter: Payer: Self-pay | Admitting: Internal Medicine

## 2022-05-24 ENCOUNTER — Encounter: Payer: Self-pay | Admitting: Family Medicine

## 2022-05-28 ENCOUNTER — Encounter: Payer: Self-pay | Admitting: Family Medicine

## 2022-05-28 ENCOUNTER — Ambulatory Visit (INDEPENDENT_AMBULATORY_CARE_PROVIDER_SITE_OTHER): Payer: Medicare HMO | Admitting: Family Medicine

## 2022-05-28 VITALS — BP 118/72 | HR 76 | Ht 62.0 in | Wt 182.0 lb

## 2022-05-28 DIAGNOSIS — K439 Ventral hernia without obstruction or gangrene: Secondary | ICD-10-CM

## 2022-05-28 NOTE — Progress Notes (Signed)
Subjective:    Patient ID: Lorrene Canova, female    DOB: [birth year], 67 y.o.   MRN: [id number]  Anastazia Spinola is a 67 y.o. female presenting on 05/28/2022 for Abdominal Injury (Knot near belly button, denies pain, present about 3 days)   HPI  Left Abdomen Nodular/Knot Density Reports new finding 3 days ago, she noticed. She has had prior abdominal surgery in past. Not in this area. She notes a 2-3 cm firm nodular density that she feels non tender in abdomen, increase size with sitting up but not bulging out. Asking about hernia. No new injury or trauma  Past Surgical History:  Procedure Laterality Date   5 miscarriages     1977-1985, Blood transfusion s/p miscarriage 1977   ABDOMINAL EXPOSURE N/A 12/18/2021   Procedure: ABDOMINAL EXPOSURE;  Surgeon: Cephus Shelling, MD;  Location: [facility] OR;  Service: Vascular;  Laterality: N/A;   ABDOMINAL HYSTERECTOMY  1987   Total   ANTERIOR LUMBAR FUSION N/A 12/18/2021   Procedure: Anterior Lumbar Interbody Fusion  - Lumbar Five-Sacral One;  Surgeon: Tia Alert, MD;  Location: [facility] OR;  Service: Neurosurgery;  Laterality: N/A;   APPENDECTOMY  12/2008   AUGMENTATION MAMMAPLASTY Bilateral 2015   Bilat   AUGMENTATION MAMMAPLASTY Bilateral 2018   implants redone w/ placement of implants under muscle   breast lift bilateral, implants  05/18/2015   bilateral, silicon naturel   BREAST REDUCTION SURGERY Bilateral 1997   CESAREAN SECTION  07/27/1981   Placenta Previa   CHOLECYSTECTOMY  03/2004   Lap surgery   COLONOSCOPY WITH PROPOFOL N/A 06/13/2020   Procedure: COLONOSCOPY WITH PROPOFOL;  Surgeon: Pasty Spillers, MD;  Location: [facility] ENDOSCOPY;  Service: Endoscopy;  Laterality: N/A;   COSMETIC SURGERY     Breast implants w/ lift, tummy tuck, upper & lower Blepharop   ESOPHAGOGASTRODUODENOSCOPY (EGD) WITH PROPOFOL N/A 06/13/2020   Procedure: ESOPHAGOGASTRODUODENOSCOPY (EGD) WITH PROPOFOL;  Surgeon: Pasty Spillers, MD;  Location: [facility]  ENDOSCOPY;  Service: Endoscopy;  Laterality: N/A;   EYE SURGERY  2017   Cataract surgery & lasik   GASTRIC BYPASS  2005   Laparoscopic   IVC FILTER INSERTION  12/2003   TrapEase Vena Cava Filter   LUMBAR LAMINECTOMY  06/2006   L4-L5 (spinal fusion)   mini tummy tuck  05/07/2016   Bilateral bra/back roll lift skin removal   neck fusion     c3-c7 cadavier bones and metal plates placed per patient   neck surgery C3-C7  02/10/2015   ACDF   PROSTATE SURGERY     REDUCTION MAMMAPLASTY Bilateral 1997   SHOULDER ACROMIOPLASTY Left 03/27/2013   w/ labral debridement   SMALL INTESTINE SURGERY  12/24/2003   Lap Gastric Bypass   SPINAL CORD STIMULATOR IMPLANT  11/2010   removed 05/2011   SPINE SURGERY  2008 2017   Fusion L4-L5, ACDF C4-C7        05/01/2022    8:04 AM 01/15/2022   11:45 AM 12/27/2021    8:22 AM  Depression screen PHQ 2/9  Decreased Interest 0  1  Down, Depressed, Hopeless 0  0  PHQ - 2 Score 0  1  Altered sleeping   0  Tired, decreased energy   1  Change in appetite   0  Feeling bad or failure about yourself    0  Trouble concentrating   0  Moving slowly or fidgety/restless   0  Suicidal thoughts   0  PHQ-9 Score   2  Difficult doing work/chores   Not difficult at all     Information is confidential and restricted. Go to Review Flowsheets to unlock data.    Social History   Tobacco Use   Smoking status: Former    Packs/day: 0.25    Years: 10.00    Additional pack years: 0.00    Total pack years: 2.50    Types: Cigarettes    Quit date: 11/29/1995    Years since quitting: 26.5    Passive exposure: Past   Smokeless tobacco: Former   Tobacco comments:    Quite 1997, didnt smoke hardly at all when I was smoking.  Vaping Use   Vaping Use: Never used  Substance Use Topics   Alcohol use: No   Drug use: No    Review of Systems Per HPI unless specifically indicated above     Objective:    BP 118/72   Pulse 76   Ht 5\' 2"  (1.575 m)   Wt 182 lb (82.6  kg)   SpO2 100%   BMI 33.29 kg/m   Wt Readings from Last 3 Encounters:  05/28/22 182 lb (82.6 kg)  05/01/22 189 lb (85.7 kg)  03/19/22 200 lb 3.2 oz (90.8 kg)    Physical Exam Vitals and nursing note reviewed.  Constitutional:      General: She is not in acute distress.    Appearance: Normal appearance. She is well-developed. She is not diaphoretic.     Comments: Well-appearing, comfortable, cooperative  HENT:     Head: Normocephalic and atraumatic.  Eyes:     General:        Right eye: No discharge.        Left eye: No discharge.     Conjunctiva/sclera: Conjunctivae normal.  Cardiovascular:     Rate and Rhythm: Normal rate.  Pulmonary:     Effort: Pulmonary effort is normal.  Abdominal:     Hernia: A hernia (localized area 2-3 cm vertical left abdomen in area of umbilical region , inc size with sitting up on exam, no actual active herniation) is present.  Skin:    General: Skin is warm and dry.     Findings: No erythema, lesion or rash.  Neurological:     Mental Status: She is alert and oriented to person, place, and time.  Psychiatric:        Mood and Affect: Mood normal.        Behavior: Behavior normal.        Thought Content: Thought content normal.     Comments: Well groomed, good eye contact, normal speech and thoughts    Results for orders placed or performed in visit on 05/01/22  COMPLETE METABOLIC PANEL WITH GFR  Result Value Ref Range   Glucose, Bld 86 65 - 99 mg/dL   BUN 19 7 - 25 mg/dL   Creat 7.82 9.56 - 2.13 mg/dL   eGFR 73 > OR = 60 YQ/MVH/8.46N6   BUN/Creatinine Ratio SEE NOTE: 6 - 22 (calc)   Sodium 142 135 - 146 mmol/L   Potassium 4.9 3.5 - 5.3 mmol/L   Chloride 103 98 - 110 mmol/L   CO2 32 20 - 32 mmol/L   Calcium 9.7 8.6 - 10.4 mg/dL   Total Protein 7.2 6.1 - 8.1 g/dL   Albumin 4.6 3.6 - 5.1 g/dL   Globulin 2.6 1.9 - 3.7 g/dL (calc)   AG Ratio 1.8 1.0 - 2.5 (calc)   Total Bilirubin 0.7 0.2 - 1.2 mg/dL  Alkaline phosphatase (APISO) 65 37  - 153 U/L   AST 22 10 - 35 U/L   ALT 13 6 - 29 U/L  CBC with Differential/Platelet  Result Value Ref Range   WBC 6.3 3.8 - 10.8 Thousand/uL   RBC 5.11 (H) 3.80 - 5.10 Million/uL   Hemoglobin 14.4 11.7 - 15.5 g/dL   HCT 16.1 09.6 - 04.5 %   MCV 88.1 80.0 - 100.0 fL   MCH 28.2 27.0 - 33.0 pg   MCHC 32.0 32.0 - 36.0 g/dL   RDW 40.9 81.1 - 91.4 %   Platelets 169 140 - 400 Thousand/uL   MPV 10.2 7.5 - 12.5 fL   Neutro Abs 4,334 1,500 - 7,800 cells/uL   Lymphs Abs 1,266 850 - 3,900 cells/uL   Absolute Monocytes 454 200 - 950 cells/uL   Eosinophils Absolute 214 15 - 500 cells/uL   Basophils Absolute 32 0 - 200 cells/uL   Neutrophils Relative % 68.8 %   Total Lymphocyte 20.1 %   Monocytes Relative 7.2 %   Eosinophils Relative 3.4 %   Basophils Relative 0.5 %  Hemoglobin A1c  Result Value Ref Range   Hgb A1c MFr Bld 5.4 <5.7 % of total Hgb   Mean Plasma Glucose 108 mg/dL   eAG (mmol/L) 6.0 mmol/L  Lipid panel  Result Value Ref Range   Cholesterol 133 <200 mg/dL   HDL 69 > OR = 50 mg/dL   Triglycerides 782 <956 mg/dL   LDL Cholesterol (Calc) 43 mg/dL (calc)   Total CHOL/HDL Ratio 1.9 <5.0 (calc)   Non-HDL Cholesterol (Calc) 64 <213 mg/dL (calc)  Vitamin Y86  Result Value Ref Range   Vitamin B-12 463 200 - 1,100 pg/mL  TSH  Result Value Ref Range   TSH 2.11 0.40 - 4.50 mIU/L  Iron, TIBC and Ferritin Panel  Result Value Ref Range   Iron 62 45 - 160 mcg/dL   TIBC 578 469 - 629 mcg/dL (calc)   %SAT 17 16 - 45 % (calc)   Ferritin 65 16 - 288 ng/mL      Assessment & Plan:   Problem List Items Addressed This Visit   None Visit Diagnoses     Ventral hernia without obstruction or gangrene    -  Primary       Suspected ventral abdominal wall hernia that is early or mild. Not entirely convinced full hernia at this point, feels asymmetry from L to R approx 2-3 cm vertical region of density palpated inc size with valsalva and sitting up on exam, no actual herniation.  Reassurance given, no further intervention today, monitor, consider imaging, referral if indicated.  No orders of the defined types were placed in this encounter.     Follow up plan: Return if symptoms worsen or fail to improve.   Saralyn Pilar, DO [facility] 05/28/2022, 11:28 AM

## 2022-05-28 NOTE — Patient Instructions (Addendum)
   Please schedule a Follow-up Appointment to: Return if symptoms worsen or fail to improve.  If you have any other questions or concerns, please feel free to call the office or send a message through MyChart. You may also schedule an earlier appointment if necessary.  Additionally, you may be receiving a survey about your experience at our office within a few days to 1 week by e-mail or mail. We value your feedback.  Mylik Pro, DO [facility], [facility] 

## 2022-05-31 ENCOUNTER — Encounter: Payer: Self-pay | Admitting: Internal Medicine

## 2022-05-31 ENCOUNTER — Encounter: Payer: Self-pay | Admitting: Family Medicine

## 2022-05-31 DIAGNOSIS — K439 Ventral hernia without obstruction or gangrene: Secondary | ICD-10-CM

## 2022-06-05 ENCOUNTER — Encounter: Payer: Self-pay | Admitting: Internal Medicine

## 2022-06-06 ENCOUNTER — Ambulatory Visit: Payer: Medicare HMO | Admitting: Family Medicine

## 2022-06-07 ENCOUNTER — Encounter: Payer: Self-pay | Admitting: Family Medicine

## 2022-06-07 ENCOUNTER — Other Ambulatory Visit: Payer: Self-pay | Admitting: Medical

## 2022-06-07 ENCOUNTER — Encounter: Payer: Self-pay | Admitting: Internal Medicine

## 2022-06-07 ENCOUNTER — Ambulatory Visit
Admission: RE | Admit: 2022-06-07 | Discharge: 2022-06-07 | Disposition: A | Discharge status: Home / Self Care | Payer: Medicare HMO | Source: Ambulatory Visit | Attending: Internal Medicine | Admitting: Internal Medicine

## 2022-06-07 DIAGNOSIS — K439 Ventral hernia without obstruction or gangrene: Secondary | ICD-10-CM | POA: Diagnosis not present

## 2022-06-07 DIAGNOSIS — R1900 Intra-abdominal and pelvic swelling, mass and lump, unspecified site: Secondary | ICD-10-CM

## 2022-06-07 DIAGNOSIS — K3189 Other diseases of stomach and duodenum: Secondary | ICD-10-CM | POA: Diagnosis not present

## 2022-06-08 ENCOUNTER — Encounter: Payer: Self-pay | Admitting: Internal Medicine

## 2022-06-12 ENCOUNTER — Encounter: Payer: Self-pay | Admitting: Family Medicine

## 2022-06-12 DIAGNOSIS — R1900 Intra-abdominal and pelvic swelling, mass and lump, unspecified site: Secondary | ICD-10-CM

## 2022-06-12 DIAGNOSIS — S301XXA Contusion of abdominal wall, initial encounter: Secondary | ICD-10-CM

## 2022-06-16 ENCOUNTER — Other Ambulatory Visit: Payer: Self-pay | Admitting: Psychiatry

## 2022-06-16 DIAGNOSIS — F4310 Post-traumatic stress disorder, unspecified: Secondary | ICD-10-CM

## 2022-06-20 ENCOUNTER — Other Ambulatory Visit: Payer: Self-pay | Admitting: Psychiatry

## 2022-06-20 DIAGNOSIS — F4310 Post-traumatic stress disorder, unspecified: Secondary | ICD-10-CM

## 2022-06-20 DIAGNOSIS — G4701 Insomnia due to medical condition: Secondary | ICD-10-CM

## 2022-06-20 DIAGNOSIS — F3162 Bipolar disorder, current episode mixed, moderate: Secondary | ICD-10-CM

## 2022-06-21 ENCOUNTER — Ambulatory Visit: Payer: Medicaid Other | Admitting: Surgery

## 2022-06-24 ENCOUNTER — Other Ambulatory Visit: Payer: Self-pay | Admitting: Family Medicine

## 2022-06-24 DIAGNOSIS — H00011 Hordeolum externum right upper eyelid: Secondary | ICD-10-CM

## 2022-06-24 DIAGNOSIS — B379 Candidiasis, unspecified: Secondary | ICD-10-CM

## 2022-06-24 DIAGNOSIS — H00021 Hordeolum internum right upper eyelid: Secondary | ICD-10-CM

## 2022-06-25 NOTE — Telephone Encounter (Signed)
Requested medications are due for refill today.  unsure  Requested medications are on the active medications list.  Only Diflucan  Last refill. Varied  Future visit scheduled.   yes  Notes to clinic.  All 3 medications require review by provider for refill.    Requested Prescriptions  Pending Prescriptions Disp Refills   trimethoprim-polymyxin b (POLYTRIM) ophthalmic solution [Pharmacy Med Name: POLYMYXIN B-TMP EYE DROPS] 10 mL 0    Sig: PLACE 1 DROP INTO THE RIGHT EYE EVERY 6 HOURS.     Off-Protocol Failed - 06/24/2022  5:08 PM      Failed - Medication not assigned to a protocol, review manually.      Passed - Valid encounter within last 12 months    Recent Outpatient Visits           4 weeks ago Ventral hernia without obstruction or gangrene   [place] [facility] [place], Netta Neat, DO   1 month ago Annual physical exam   [place] [facility] [place], Netta Neat, DO   3 months ago Eustachian tube dysfunction, bilateral   [place] [facility] [place], Netta Neat, DO   6 months ago Spinal stenosis of lumbar region without neurogenic claudication   [place] [facility] [place], Netta Neat, DO   7 months ago Hordeolum internum of right upper eyelid   [place] [facility] Althea Charon, Netta Neat, DO       Future Appointments             In 4 months Althea Charon, Netta Neat, DO [place] [facility]             amoxicillin-clavulanate (AUGMENTIN) 875-125 MG tablet [Pharmacy Med Name: AMOXICILLIN-CLAV 875-125MG  TAB] 20 tablet 0    Sig: TAKE 1 TABLET BY MOUTH TWICE A DAY     Off-Protocol Failed - 06/24/2022  5:08 PM      Failed - Medication not assigned to a protocol, review manually.      Passed - Valid encounter within last 12 months    Recent Outpatient Visits           4 weeks ago Ventral hernia without obstruction or  gangrene   [place] [facility] [place], Netta Neat, DO   1 month ago Annual physical exam   [place] [facility] [place], Netta Neat, DO   3 months ago Eustachian tube dysfunction, bilateral   [place] [facility] [place], Netta Neat, DO   6 months ago Spinal stenosis of lumbar region without neurogenic claudication   [place] [facility] [place], Netta Neat, DO   7 months ago Hordeolum internum of right upper eyelid   [place] [facility] Althea Charon, Netta Neat, DO       Future Appointments             In 4 months Althea Charon, Netta Neat, DO  [facility]             fluconazole (DIFLUCAN) 150 MG tablet [Pharmacy Med Name: FLUCONAZOLE 150 MG TABLET] 2 tablet 0    Sig: Take one tablet by mouth on Day 1. Repeat dose 2nd tablet on Day 3.     Off-Protocol Failed - 06/24/2022  5:08 PM      Failed - Medication not assigned to a protocol, review manually.  Passed - Valid encounter within last 12 months    Recent Outpatient Visits           4 weeks ago Ventral hernia without obstruction or gangrene   [place] [facility] [place], Netta Neat, DO   1 month ago Annual physical exam   [place] [facility] Smitty Cords, DO   3 months ago Eustachian tube dysfunction, bilateral   [place] [facility] [place], Netta Neat, DO   6 months ago Spinal stenosis of lumbar region without neurogenic claudication   [place] [facility] [place], Netta Neat, DO   7 months ago Hordeolum internum of right upper eyelid   [place] [facility] Althea Charon, Netta Neat, DO       Future Appointments             In 4 months Althea Charon, Netta Neat, DO  [facility]

## 2022-06-26 ENCOUNTER — Encounter (INDEPENDENT_AMBULATORY_CARE_PROVIDER_SITE_OTHER): Payer: Medicare HMO | Admitting: Family Medicine

## 2022-06-26 DIAGNOSIS — H00014 Hordeolum externum left upper eyelid: Secondary | ICD-10-CM | POA: Diagnosis not present

## 2022-06-26 DIAGNOSIS — J3089 Other allergic rhinitis: Secondary | ICD-10-CM

## 2022-06-26 DIAGNOSIS — J0110 Acute frontal sinusitis, unspecified: Secondary | ICD-10-CM

## 2022-06-26 NOTE — Addendum Note (Signed)
Addended by: Smitty Cords on: 06/26/2022 06:51 PM   Modules accepted: Orders

## 2022-06-26 NOTE — Telephone Encounter (Signed)
Please see the MyChart message reply(ies) for my assessment and plan.    This patient gave consent for this Medical Advice Message and is aware that it may result in a bill to their insurance company, as well as the possibility of receiving a bill for a co-payment or deductible. They are an established patient, but are not seeking medical advice exclusively about a problem treated during an in person or video visit in the last seven days. I did not recommend an in person or video visit within seven days of my reply.    I spent a total of 10 minutes cumulative time within 7 days through MyChart messaging.  Westlee Devita, DO   

## 2022-06-27 MED ORDER — AMOXICILLIN-POT CLAVULANATE 875-125 MG PO TABS
1.0000 | ORAL_TABLET | Freq: Two times a day (BID) | ORAL | 0 refills | Status: DC
Start: 2022-06-27 — End: 2022-07-26

## 2022-06-27 NOTE — Addendum Note (Signed)
Addended by: Smitty Cords on: 06/27/2022 06:21 PM   Modules accepted: Orders

## 2022-06-28 NOTE — Addendum Note (Signed)
Addended by: Smitty Cords on: 06/28/2022 01:41 PM   Modules accepted: Orders

## 2022-06-28 NOTE — Progress Notes (Addendum)
Smitty Cords, DO   Chief Complaint  Patient presents with   Referral    Hot flashes, feeling bloated   Vaginal Itching    Irritation, unsure of discharge and odor   Urinary Tract Infection    Frequency and burning urinating    HPI:      [name] is a 67 y.o. No obstetric history on file. whose LMP was No LMP recorded. Patient has had a hysterectomy., presents today for NP eval of hot flashes, referred by PCP. Pt s/p TAHBSO late 20s due to AUB/endometriosis. Started on estragiol 1 mg dose and did well for many yrs. Stopped for a few yrs more recently, then restarted again a few yrs ago and VS controlled. Having increased hot flashes recently, no night sweats. Has been under increased stress with health of husband, noticed increased sx in past with increased stress. Pt willing to decrease down or wean off given age.   Has vaginal itching/irritation for 2 wks; with dysuria. Sx started with scented soap, has recently changed to dove sens skin soap. Was on abx 4/24 and treated with diflucan. Started augmentin a couple days ago. No increased vag d/c, no odor. No meds to treat this time. Pt was sexually active, with dryness improved with lubricants. Unsure if she can be going forward due to husband's health issues.   Has urinary frequency/SUI with lifting in general. Noted dysuria since vag sx. Drinks lots of water. Has had LBP recently as well. S/p low back surgery 12/23. Having to care for farm animals now since husband is s/p surgery.   Hx of bilat implants, neg mammo 09/05/21. Pt feels like RT breast implant migrating laterally. Has been happening gradually over a few yrs. No pain/issues.   Patient Active Problem List   Diagnosis Date Noted   Postmenopausal atrophic vaginitis 07/02/2022   Adjustment disorder with mixed anxiety and depressed mood 01/15/2022   S/P lumbar fusion 12/18/2021   Thyroid nodule 12/04/2021   Lumbar post-laminectomy syndrome 05/25/2021    Spondylosis without myelopathy 05/25/2021   Anxiety due to invasive procedure 03/22/2021   Weight gain 09/08/2020   Bipolar 1 disorder, mixed, mild (HCC) 07/18/2020   Esophageal dysphagia    Screen for colon cancer    Polyp of sigmoid colon    PAD (peripheral artery disease) (HCC) 04/27/2020   Bipolar disorder, in full remission, most recent episode mixed (HCC) 01/18/2020   Insomnia 10/27/2019   Localized osteoporosis without current pathological fracture 08/24/2019   Obesity (BMI 30.0-34.9) 06/18/2019   Left hand pain 05/18/2019   Abrasion 05/18/2019   Bipolar disorder, in full remission, most recent episode depressed (HCC) 03/16/2019   Chronic migraine without aura with status migrainosus, not intractable 12/16/2018   Acquired iron deficiency anemia due to decreased absorption 08/22/2018   Pes cavus of left foot 08/18/2018   Peripheral edema 07/22/2018   Chalazion left upper eyelid 07/22/2018   Panic disorder 06/26/2018   Atherosclerosis of native coronary artery of native heart with stable angina pectoris (HCC) 10/01/2017   Shortness of breath 10/01/2017   Preop cardiovascular exam 10/01/2017   Cervico-occipital neuralgia (Left) 08/05/2017   Cervicogenic headache (Left) 08/05/2017   Cervical facet syndrome (Left) 08/05/2017   Spondylosis without myelopathy or radiculopathy, cervical region 08/05/2017   Cervicalgia (Left) 08/05/2017   Epidural fibrosis 07/23/2017   Abnormal intestinal absorption 07/06/2017   S/P gastric bypass 07/06/2017   Other specified dorsopathies, sacral and sacrococcygeal region 06/17/2017   Neurogenic pain 06/17/2017  Family history of coronary artery disease 05/20/2017   Carotid bruit 05/20/2017   Mixed hyperlipidemia 05/20/2017   Hollenhorst plaque, right eye 05/14/2017   GERD (gastroesophageal reflux disease) 04/17/2017   Coccygodynia 04/17/2017   PTSD (post-traumatic stress disorder) 04/04/2017   Lumbar spinal stenosis (severe) (L3-4) 03/26/2017    Lumbar lateral recess stenosis (Bilateral) (L3-4) 03/26/2017   Lumbar foraminal stenosis (L3-4) (Left) 03/26/2017   Hepatic steatosis 03/21/2017   History of nephrolithiasis 03/21/2017   History of hepatitis C 03/15/2017   Lumbar facet osteoarthritis (Bilateral) 10/25/2016   DDD (degenerative disc disease), lumbar 10/25/2016   Lumbar Facet Syndrome (Bilateral) (R>L) 10/15/2016   Grade 1 Anterolisthesis of L3 over L4 09/28/2016   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   Chronic shoulder pain (Left) 09/28/2016   History of cervical fusion (ACDF C4-C7) 09/28/2016   DDD (degenerative disc disease), cervical 09/28/2016   Chronic sacroiliac joint pain (Bilateral) (R>L) 09/28/2016   Osteoarthritis of hip (Bilateral) 09/18/2016   Chronic Low back pain (Primary Area of Pain) (Bilateral)  (R>L) 09/18/2016   Chronic lower extremity pain (Tertiary Area of Pain) (Bilateral) (L>R) 09/18/2016   High risk medication use 09/18/2016   Chronic knee pain (Left) 08/20/2016   Chronic pain syndrome 08/14/2016   Chronic neck pain  08/14/2016   Chronic hip pain (Secondary Area of Pain) (Bilateral) (L>R) 08/14/2016   Opiate use 08/14/2016    Past Surgical History:  Procedure Laterality Date   5 miscarriages     1977-1985, Blood transfusion s/p miscarriage 1977   ABDOMINAL EXPOSURE N/A 12/18/2021   Procedure: ABDOMINAL EXPOSURE;  Surgeon: Cephus Shelling, MD;  Location: [facility] OR;  Service: Vascular;  Laterality: N/A;   ABDOMINAL HYSTERECTOMY  1987   Total   ANTERIOR LUMBAR FUSION N/A 12/18/2021   Procedure: Anterior Lumbar Interbody Fusion  - Lumbar Five-Sacral One;  Surgeon: Tia Alert, MD;  Location: [facility] OR;  Service: Neurosurgery;  Laterality: N/A;   APPENDECTOMY  12/2008   AUGMENTATION MAMMAPLASTY Bilateral 2015   Bilat   AUGMENTATION MAMMAPLASTY Bilateral 2018   implants redone w/ placement of implants under muscle   breast lift bilateral, implants  05/18/2015   bilateral, silicon  naturel   BREAST REDUCTION SURGERY Bilateral 1997   CESAREAN SECTION  07/27/1981   Placenta Previa   CHOLECYSTECTOMY  03/2004   Lap surgery   COLONOSCOPY WITH PROPOFOL N/A 06/13/2020   Procedure: COLONOSCOPY WITH PROPOFOL;  Surgeon: Pasty Spillers, MD;  Location: [facility] ENDOSCOPY;  Service: Endoscopy;  Laterality: N/A;   COSMETIC SURGERY     Breast implants w/ lift, tummy tuck, upper & lower Blepharop   ESOPHAGOGASTRODUODENOSCOPY (EGD) WITH PROPOFOL N/A 06/13/2020   Procedure: ESOPHAGOGASTRODUODENOSCOPY (EGD) WITH PROPOFOL;  Surgeon: Pasty Spillers, MD;  Location: [facility] ENDOSCOPY;  Service: Endoscopy;  Laterality: N/A;   EYE SURGERY  2017   Cataract surgery & lasik   GASTRIC BYPASS  2005   Laparoscopic   IVC FILTER INSERTION  12/2003   TrapEase Vena Cava Filter   LUMBAR LAMINECTOMY  06/2006   L4-L5 (spinal fusion)   mini tummy tuck  05/07/2016   Bilateral bra/back roll lift skin removal   neck fusion     c3-c7 cadavier bones and metal plates placed per patient   neck surgery C3-C7  02/10/2015   ACDF   PROSTATE SURGERY     REDUCTION MAMMAPLASTY Bilateral 1997   SHOULDER ACROMIOPLASTY Left 03/27/2013   w/ labral debridement   SMALL INTESTINE SURGERY  12/24/2003   Lap  Gastric Bypass   SPINAL CORD STIMULATOR IMPLANT  11/2010   removed 05/2011   SPINE SURGERY  2008 2017   Fusion L4-L5, ACDF C4-C7    Family History  Problem Relation Age of Onset   COPD Mother    Lung cancer Mother 48   Diabetes Mother    Heart disease Mother    Stroke Mother    Heart attack Mother    Arthritis Mother    Hypertension Mother    Obesity Mother    Varicose Veins Mother    Heart disease Father    Heart attack Father 68   Early death Father    Depression Sister        x 5 sisters   COPD Sister    Hypertension Sister    Colon polyps Sister    Hearing loss Sister    Heart attack Sister    Arthritis Sister    Varicose Veins Sister    COPD Sister    Obesity Sister    Depression  Sister    Depression Sister    Heart disease Sister    Hypertension Sister    Hypertension Sister    Obesity Sister    Heart disease Brother        x 3 brothers   Diabetes Brother    Heart attack Brother    Arthritis Brother    Heart disease Brother    Hypertension Brother    Diabetes Brother    Heart disease Brother    Hypertension Brother    Obesity Brother    Varicose Veins Brother    Heart disease Brother    Hypertension Brother    Obesity Brother    Miscarriages / Stillbirths Maternal Aunt    Miscarriages / Stillbirths Paternal Aunt    Breast cancer Paternal Aunt        early years   Cancer Maternal Grandfather    Stroke Maternal Grandfather     Social History   Socioeconomic History   Marital status: Married    Spouse name: Not on file   Number of children: 1   Years of education: High School   Highest education level: Some college, no degree  Occupational History   Occupation: disability  Tobacco Use   Smoking status: Former    Packs/day: 0.25    Years: 10.00    Additional pack years: 0.00    Total pack years: 2.50    Types: Cigarettes    Quit date: 11/29/1995    Years since quitting: 26.6    Passive exposure: Past   Smokeless tobacco: Former   Tobacco comments:    Quite 1997, didnt smoke hardly at all when I was smoking.  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]   Social Determinants of Health   Financial Resource Strain: Low Risk  (04/27/2022)   Overall Financial Resource Strain (CARDIA)    Difficulty of Paying Living Expenses: Not hard at all  Food Insecurity: No Food Insecurity  (04/27/2022)   Hunger Vital Sign    Worried About Running Out of Food in the Last Year: Never true  Ran Out of Food in the Last Year: Never true  Transportation Needs: No Transportation Needs (04/27/2022)   PRAPARE - Administrator, Civil Service (Medical): No    Lack of Transportation (Non-Medical): No  Physical Activity: Inactive (04/27/2022)   Exercise Vital Sign    Days of Exercise per Week: 1 day    Minutes of Exercise per Session: 0 min  Stress: No Stress Concern Present (04/27/2022)   [employer] of Occupational Health - Occupational Stress Questionnaire    Feeling of Stress : Only a little  Social Connections: Unknown (04/27/2022)   Social Connection and Isolation Panel [NHANES]    Frequency of Communication with Friends and Family: More than three times a week    Frequency of Social Gatherings with Friends and Family: Once a week    Attends Religious Services: Patient declined    Database administrator or Organizations: No    Attends Engineer, structural: Not on file    Marital Status: Married  Catering manager Violence: Not At Risk (04/06/2021)   Humiliation, Afraid, Rape, and Kick questionnaire    Fear of Current or Ex-Partner: No    Emotionally Abused: No    Physically Abused: No    Sexually Abused: No    Outpatient Medications Prior to Visit  Medication Sig Dispense Refill   amoxicillin-clavulanate (AUGMENTIN) 875-125 MG tablet Take 1 tablet by mouth 2 (two) times daily. 20 tablet 0   aspirin EC (ASPIRIN LOW DOSE) 81 MG tablet TAKE 1 TABLET BY MOUTH EVERY DAY 90 tablet 0   azelastine (ASTELIN) 0.1 % nasal spray Place 2 sprays into both nostrils 2 (two) times daily. Use in each nostril as directed 30 mL 11   bisacodyl (DULCOLAX) 5 MG EC tablet Take 5 mg by mouth daily as needed for moderate constipation.     butalbital-acetaminophen-caffeine (FIORICET) 50-325-40 MG tablet Take 1 tablet by mouth 2 (two) times daily as needed for headache.      Cholecalciferol 250 MCG (10000 UT) CAPS Take 10,000 Units by mouth once a week.     cyanocobalamin (VITAMIN B12) 1000 MCG/ML injection Inject 1 mL (1,000 mcg total) into the muscle every 30 (thirty) days. 1 mL 11   diclofenac Sodium (VOLTAREN) 1 % GEL Apply 2 g topically 4 (four) times daily as needed (pain).     estradiol (ESTRACE) 1 MG tablet TAKE 1 TABLET EVERY DAY (Patient taking differently: Take 1 mg by mouth daily.) 90 tablet 1   etodolac (LODINE) 500 MG tablet Take 1 tablet by mouth 2 (two) times daily.     ezetimibe (ZETIA) 10 MG tablet Take 1 tablet (10 mg total) by mouth daily. 90 tablet 3   fluticasone (FLONASE) 50 MCG/ACT nasal spray SPRAY 2 SPRAYS INTO EACH NOSTRIL EVERY DAY 48 mL 0   furosemide (LASIX) 20 MG tablet Take 1 tablet (20 mg total) by mouth daily as needed for fluid or edema. 90 tablet 1   gabapentin (NEURONTIN) 600 MG tablet Take 1 tablet (600 mg total) by mouth in the morning, at noon, in the evening, and at bedtime. 360 tablet 3   hydrOXYzine (ATARAX) 25 MG tablet Take 1 tablet (25 mg total) by mouth every 8 (eight) hours as needed for anxiety. 270 tablet 1   omeprazole (PRILOSEC) 40 MG capsule Take 1 capsule (40 mg total) by mouth daily. 90 capsule 3   ondansetron (ZOFRAN ODT) 4 MG disintegrating tablet Take 1 tablet (4 mg total) by mouth every  8 (eight) hours as needed for nausea or vomiting. 30 tablet 2   Oxcarbazepine (TRILEPTAL) 300 MG tablet TAKE 1 TABLET BY MOUTH 2 TIMES DAILY. 180 tablet 0   oxyCODONE-acetaminophen (PERCOCET) 7.5-325 MG tablet Take 1 tablet by mouth every 6 (six) hours as needed for severe pain.     propranolol (INDERAL) 10 MG tablet TAKE 1 TABLET (10 MG TOTAL) BY MOUTH 3 (THREE) TIMES DAILY AS NEEDED. FOR ANXIETY ATTACKS 270 tablet 0   rizatriptan (MAXALT-MLT) 10 MG disintegrating tablet Take 1 tablet (10 mg total) by mouth as needed for migraine. May repeat in 2 hours if needed, that is max dose for 24 hours. 10 tablet 3   rosuvastatin (CRESTOR)  40 MG tablet TAKE 1 TABLET BY MOUTH EVERY DAY 90 tablet 1   tiZANidine (ZANAFLEX) 4 MG tablet Take 1 tablet (4 mg total) by mouth 4 (four) times daily. 360 tablet 1   traZODone (DESYREL) 100 MG tablet TAKE 1-1.5 TABLETS (100-150 MG TOTAL) BY MOUTH AT BEDTIME AS NEEDED FOR SLEEP. 135 tablet 0   valACYclovir (VALTREX) 1000 MG tablet TAKE 1 TABLET (1,000 MG TOTAL) BY MOUTH DAILY. FOR 5-7 DAYS, MAY REPEAT IF NEED FOR COLD SORE 90 tablet 1   venlafaxine XR (EFFEXOR-XR) 75 MG 24 hr capsule Take 1 capsule (75 mg total) by mouth daily with breakfast. 90 capsule 1   Zoledronic Acid (ZOMETA) 4 MG/100ML IVPB Once per year from Orthopedic 100 mL    fluconazole (DIFLUCAN) 150 MG tablet Take one tablet by mouth on Day 1. Repeat dose 2nd tablet on Day 3. 2 tablet 0   TIRZEPATIDE Story Inject into the skin.     No facility-administered medications prior to visit.      ROS:  Review of Systems  Constitutional:  Negative for fever.  Gastrointestinal:  Negative for blood in stool, constipation, diarrhea, nausea and vomiting.  Genitourinary:  Positive for dysuria and frequency. Negative for dyspareunia, flank pain, hematuria, urgency, vaginal bleeding, vaginal discharge and vaginal pain.  Musculoskeletal:  Negative for back pain.  Skin:  Negative for rash.   BREAST: No symptoms   OBJECTIVE:   Vitals:  BP 108/74   Ht 5\' 2"  (1.575 m)   Wt 177 lb (80.3 kg)   BMI 32.37 kg/m   Physical Exam Vitals reviewed.  Constitutional:      Appearance: She is well-developed.  Pulmonary:     Effort: Pulmonary effort is normal.  Chest:  Breasts:    Breasts are symmetrical.     Right: No inverted nipple, mass, nipple discharge, skin change or tenderness.     Left: No inverted nipple, mass, nipple discharge, skin change or tenderness.     Comments: RT IMPLANT HAS SHIFTED TO MORE LATERALLY/AXILLARY; NOT DEFLATED. NO MASSES Genitourinary:    General: Normal vulva.     Pubic Area: No rash.      Labia:         Right: No rash, tenderness or lesion.        Left: No rash, tenderness or lesion.      Vagina: Normal. No vaginal discharge, erythema or tenderness.     Cervix: Normal.     Adnexa:        Right: No mass or tenderness.         Left: No mass or tenderness.       Comments: MILD EXT VAG ATROPHY Musculoskeletal:        General: Normal range of motion.     Cervical  back: Normal range of motion.  Skin:    General: Skin is warm and dry.  Neurological:     General: No focal deficit present.     Mental Status: She is alert and oriented to person, place, and time.     Cranial Nerves: No cranial nerve deficit.  Psychiatric:        Mood and Affect: Mood normal.        Behavior: Behavior normal.        Thought Content: Thought content normal.        Judgment: Judgment normal.     Results: Results for orders placed or performed in visit on 07/02/22 (from the past 24 hour(s))  POCT Urinalysis Dipstick     Status: Abnormal   Collection Time: 07/02/22 11:52 AM  Result Value Ref Range   Color, UA yellow    Clarity, UA clear    Glucose, UA Negative Negative   Bilirubin, UA pos    Ketones, UA neg    Spec Grav, UA 1.020 1.010 - 1.025   Blood, UA neg    pH, UA 7.0 5.0 - 8.0   Protein, UA Negative Negative   Urobilinogen, UA neg    Nitrite, UA neg    Leukocytes, UA Negative Negative   Appearance     Odor    POCT Wet Prep with KOH     Status: Normal   Collection Time: 07/02/22 11:54 AM  Result Value Ref Range   Trichomonas, UA Negative    Clue Cells Wet Prep HPF POC neg    Epithelial Wet Prep HPF POC     Yeast Wet Prep HPF POC neg    Bacteria Wet Prep HPF POC     RBC Wet Prep HPF POC     WBC Wet Prep HPF POC     KOH Prep POC Negative Negative   PT ON ABX; NORMAL CMP 4/24; QUESTION BILIRUBIN DUE TO ABX USE  Assessment/Plan: Vaginal itching - Plan: fluconazole (DIFLUCAN) 150 MG tablet, POCT Wet Prep with KOH; pos sx, mild atrophy on exam; pt on abx and had started scented soap with  sx. Cont dove sensitive skin soap, Rx diflucan prn yeast vag, start vag ERT. F/u prn.   Vaginal burning - Plan: POCT Wet Prep with KOH; rule out UTI with C&S. Most likely due to yeast vs atrophy. Treat both, f/u prn.   Dysuria - Plan: Urine Culture, POCT Urinalysis Dipstick; neg UA, check C&S. If neg, then related to vag sx and LBP due to hx of recent lumbar/sacral surgery and more activity feeding farm animals.   Postmenopausal atrophic vaginitis - Plan: estradiol (ESTRACE VAGINAL) 0.1 MG/GM vaginal cream; apply cream externally. F/u prn. If becomes sexually active again, can use 1 g vaginally prn.   Displacement of breast implant, initial encounter--no sx for pt. Reassurance Can do revision with plastic surg.     Meds ordered this encounter  Medications   fluconazole (DIFLUCAN) 150 MG tablet    Sig: Take one tablet by mouth on Day 1. Repeat dose 2nd tablet on Day 3.    Dispense:  2 tablet    Refill:  0    Order Specific Question:   Supervising Provider    Answer:   Hildred Laser [[id number]]   estradiol (ESTRACE VAGINAL) 0.1 MG/GM vaginal cream    Sig: Apply pea sized amount externally nightly for 1 week, then once weekly as maintenance    Dispense:  42.5 g    Refill:  0    Order Specific Question:   Supervising Provider    Answer:   Hildred Laser [[id number]]      Return if symptoms worsen or fail to improve.  Irlanda Croghan B. Cohl Behrens, PA-C 07/02/2022 11:56 AM

## 2022-07-02 ENCOUNTER — Ambulatory Visit (INDEPENDENT_AMBULATORY_CARE_PROVIDER_SITE_OTHER): Payer: Medicare HMO | Admitting: Obstetrics and Gynecology

## 2022-07-02 ENCOUNTER — Encounter: Payer: Self-pay | Admitting: Obstetrics and Gynecology

## 2022-07-02 VITALS — BP 108/74 | Ht 62.0 in | Wt 177.0 lb

## 2022-07-02 DIAGNOSIS — N949 Unspecified condition associated with female genital organs and menstrual cycle: Secondary | ICD-10-CM

## 2022-07-02 DIAGNOSIS — N952 Postmenopausal atrophic vaginitis: Secondary | ICD-10-CM | POA: Diagnosis not present

## 2022-07-02 DIAGNOSIS — R300 Dysuria: Secondary | ICD-10-CM

## 2022-07-02 DIAGNOSIS — T8542XA Displacement of breast prosthesis and implant, initial encounter: Secondary | ICD-10-CM

## 2022-07-02 DIAGNOSIS — N898 Other specified noninflammatory disorders of vagina: Secondary | ICD-10-CM

## 2022-07-02 LAB — POCT URINALYSIS DIPSTICK
Bilirubin, UA: NEGATIVE
Blood, UA: NEGATIVE
Glucose, UA: NEGATIVE
Ketones, UA: NEGATIVE
Leukocytes, UA: NEGATIVE
Nitrite, UA: NEGATIVE
Protein, UA: NEGATIVE
Spec Grav, UA: 1.02 (ref 1.010–1.025)
pH, UA: 7 (ref 5.0–8.0)

## 2022-07-02 LAB — POCT WET PREP WITH KOH
Clue Cells Wet Prep HPF POC: NEGATIVE
KOH Prep POC: NEGATIVE
Trichomonas, UA: NEGATIVE
Yeast Wet Prep HPF POC: NEGATIVE

## 2022-07-02 MED ORDER — FLUCONAZOLE 150 MG PO TABS: ORAL_TABLET | ORAL | 0 refills | Status: DC

## 2022-07-02 MED ORDER — ESTRADIOL 0.1 MG/GM VA CREA
TOPICAL_CREAM | VAGINAL | 0 refills | Status: DC
Start: 2022-07-02 — End: 2023-03-20

## 2022-07-02 NOTE — Patient Instructions (Signed)
I value your feedback and you entrusting us with your care. If you get a [facility] patient survey, I would appreciate you taking the time to let us know about your experience today. Thank you! ? ? ?

## 2022-07-03 DIAGNOSIS — H16223 Keratoconjunctivitis sicca, not specified as Sjogren's, bilateral: Secondary | ICD-10-CM | POA: Diagnosis not present

## 2022-07-03 DIAGNOSIS — H01024 Squamous blepharitis left upper eyelid: Secondary | ICD-10-CM | POA: Diagnosis not present

## 2022-07-03 DIAGNOSIS — H01021 Squamous blepharitis right upper eyelid: Secondary | ICD-10-CM | POA: Diagnosis not present

## 2022-07-03 DIAGNOSIS — H04123 Dry eye syndrome of bilateral lacrimal glands: Secondary | ICD-10-CM | POA: Diagnosis not present

## 2022-07-03 DIAGNOSIS — H0014 Chalazion left upper eyelid: Secondary | ICD-10-CM | POA: Diagnosis not present

## 2022-07-04 LAB — URINE CULTURE: Organism ID, Bacteria: NO GROWTH

## 2022-07-05 ENCOUNTER — Encounter: Payer: Self-pay | Admitting: Internal Medicine

## 2022-07-05 DIAGNOSIS — G894 Chronic pain syndrome: Secondary | ICD-10-CM | POA: Diagnosis not present

## 2022-07-05 DIAGNOSIS — M545 Low back pain, unspecified: Secondary | ICD-10-CM | POA: Diagnosis not present

## 2022-07-05 DIAGNOSIS — Z79899 Other long term (current) drug therapy: Secondary | ICD-10-CM | POA: Diagnosis not present

## 2022-07-05 DIAGNOSIS — M47896 Other spondylosis, lumbar region: Secondary | ICD-10-CM | POA: Diagnosis not present

## 2022-07-05 DIAGNOSIS — M533 Sacrococcygeal disorders, not elsewhere classified: Secondary | ICD-10-CM | POA: Diagnosis not present

## 2022-07-05 DIAGNOSIS — M25552 Pain in left hip: Secondary | ICD-10-CM | POA: Diagnosis not present

## 2022-07-05 DIAGNOSIS — M479 Spondylosis, unspecified: Secondary | ICD-10-CM | POA: Diagnosis not present

## 2022-07-05 DIAGNOSIS — M48061 Spinal stenosis, lumbar region without neurogenic claudication: Secondary | ICD-10-CM | POA: Diagnosis not present

## 2022-07-05 DIAGNOSIS — Z5181 Encounter for therapeutic drug level monitoring: Secondary | ICD-10-CM | POA: Diagnosis not present

## 2022-07-05 DIAGNOSIS — M5416 Radiculopathy, lumbar region: Secondary | ICD-10-CM | POA: Diagnosis not present

## 2022-07-11 ENCOUNTER — Other Ambulatory Visit: Payer: Self-pay | Admitting: Family Medicine

## 2022-07-11 DIAGNOSIS — J3089 Other allergic rhinitis: Secondary | ICD-10-CM

## 2022-07-11 NOTE — Telephone Encounter (Signed)
Requested Prescriptions  Pending Prescriptions Disp Refills   fluticasone (FLONASE) 50 MCG/ACT nasal spray [Pharmacy Med Name: FLUTICASONE PROP 50 MCG SPRAY] 48 mL 0    Sig: SPRAY 2 SPRAYS INTO EACH NOSTRIL EVERY DAY     Ear, Nose, and Throat: Nasal Preparations - Corticosteroids Passed - 07/11/2022  2:17 AM      Passed - Valid encounter within last 12 months    Recent Outpatient Visits           1 month ago Ventral hernia without obstruction or gangrene   [place] [facility] [place], Netta Neat, DO   2 months ago Annual physical exam   [place] [facility] Smitty Cords, DO   3 months ago Eustachian tube dysfunction, bilateral   [place] [facility] [place], Netta Neat, DO   6 months ago Spinal stenosis of lumbar region without neurogenic claudication   [place] [facility] [place], Netta Neat, DO   8 months ago Hordeolum internum of right upper eyelid   [place] [facility] [place], Netta Neat, DO       Future Appointments             In 3 months Althea Charon, Netta Neat, DO [place] [facility], [facility]

## 2022-07-13 ENCOUNTER — Encounter: Payer: Self-pay | Admitting: Podiatry

## 2022-07-16 NOTE — Progress Notes (Unsigned)
Patient name: Ashley Fields MRN: [id number] DOB: 01-Aug-1955 Sex: female  REASON FOR CONSULT: Seroma postop  HPI: Ashley Fields is a 67 y.o. female, that presents for evaluation of possible seroma after previous anterior exposure for spine surgery.  Patient had an L5-S1 ALIF on 12/18/2021.  She subsequently developed a collection in her abdominal wall.  She had an ultrasound of her abdomen on 06/07/2022 showing no hernia but a 6.8 x 1.6 x 6.8 hypoechoic mass concerning for seroma.  Past Medical History:  Diagnosis Date   Allergy    Anemia    Anxiety    Arthritis    Yes   Back pain    Blood transfusion without reported diagnosis 1977   Transfusions from miscarriage & hemorrhaging   Chronic kidney disease    Depression    Disp fx of cuboid bone of right foot, init for clos fx 01/01/2017   Fibromyalgia    GERD (gastroesophageal reflux disease)    Headache    Hepatitis C    Hyperlipidemia    Lumbar radiculopathy    Neck pain    Neuromuscular disorder (HCC) 1997   Falling to much was an eye-opener   Osteoporosis    Thyroid disease    Urine incontinence     Past Surgical History:  Procedure Laterality Date   5 miscarriages     1977-1985, Blood transfusion s/p miscarriage 1977   ABDOMINAL EXPOSURE N/A 12/18/2021   Procedure: ABDOMINAL EXPOSURE;  Surgeon: Cephus Shelling, MD;  Location: [facility] OR;  Service: Vascular;  Laterality: N/A;   ABDOMINAL HYSTERECTOMY  1987   Total   ANTERIOR LUMBAR FUSION N/A 12/18/2021   Procedure: Anterior Lumbar Interbody Fusion  - Lumbar Five-Sacral One;  Surgeon: Tia Alert, MD;  Location: [facility] OR;  Service: Neurosurgery;  Laterality: N/A;   APPENDECTOMY  12/2008   AUGMENTATION MAMMAPLASTY Bilateral 2015   Bilat   AUGMENTATION MAMMAPLASTY Bilateral 2018   implants redone w/ placement of implants under muscle   breast lift bilateral, implants  05/18/2015   bilateral, silicon naturel   BREAST REDUCTION SURGERY Bilateral 1997   CESAREAN SECTION   07/27/1981   Placenta Previa   CHOLECYSTECTOMY  03/2004   Lap surgery   COLONOSCOPY WITH PROPOFOL N/A 06/13/2020   Procedure: COLONOSCOPY WITH PROPOFOL;  Surgeon: Pasty Spillers, MD;  Location: [facility] ENDOSCOPY;  Service: Endoscopy;  Laterality: N/A;   COSMETIC SURGERY     Breast implants w/ lift, tummy tuck, upper & lower Blepharop   ESOPHAGOGASTRODUODENOSCOPY (EGD) WITH PROPOFOL N/A 06/13/2020   Procedure: ESOPHAGOGASTRODUODENOSCOPY (EGD) WITH PROPOFOL;  Surgeon: Pasty Spillers, MD;  Location: [facility] ENDOSCOPY;  Service: Endoscopy;  Laterality: N/A;   EYE SURGERY  2017   Cataract surgery & lasik   GASTRIC BYPASS  2005   Laparoscopic   IVC FILTER INSERTION  12/2003   TrapEase Vena Cava Filter   LUMBAR LAMINECTOMY  06/2006   L4-L5 (spinal fusion)   mini tummy tuck  05/07/2016   Bilateral bra/back roll lift skin removal   neck fusion     c3-c7 cadavier bones and metal plates placed per patient   neck surgery C3-C7  02/10/2015   ACDF   PROSTATE SURGERY     REDUCTION MAMMAPLASTY Bilateral 1997   SHOULDER ACROMIOPLASTY Left 03/27/2013   w/ labral debridement   SMALL INTESTINE SURGERY  12/24/2003   Lap Gastric Bypass   SPINAL CORD STIMULATOR IMPLANT  11/2010   removed 05/2011   SPINE SURGERY  2008  2017   Fusion L4-L5, ACDF C4-C7    Family History  Problem Relation Age of Onset   COPD Mother    Lung cancer Mother 42   Diabetes Mother    Heart disease Mother    Stroke Mother    Heart attack Mother    Arthritis Mother    Hypertension Mother    Obesity Mother    Varicose Veins Mother    Heart disease Father    Heart attack Father 33   Early death Father    Depression Sister        x 5 sisters   COPD Sister    Hypertension Sister    Colon polyps Sister    Hearing loss Sister    Heart attack Sister    Arthritis Sister    Varicose Veins Sister    COPD Sister    Obesity Sister    Depression Sister    Depression Sister    Heart disease Sister    Hypertension  Sister    Hypertension Sister    Obesity Sister    Heart disease Brother        x 3 brothers   Diabetes Brother    Heart attack Brother    Arthritis Brother    Heart disease Brother    Hypertension Brother    Diabetes Brother    Heart disease Brother    Hypertension Brother    Obesity Brother    Varicose Veins Brother    Heart disease Brother    Hypertension Brother    Obesity Brother    Miscarriages / Stillbirths Maternal Aunt    Miscarriages / Stillbirths Paternal Aunt    Breast cancer Paternal Aunt        early years   Cancer Maternal Grandfather    Stroke Maternal Grandfather     SOCIAL HISTORY: Social History   Socioeconomic History   Marital status: Married    Spouse name: Not on file   Number of children: 1   Years of education: High School   Highest education level: Some college, no degree  Occupational History   Occupation: disability  Tobacco Use   Smoking status: Former    Packs/day: 0.25    Years: 10.00    Additional pack years: 0.00    Total pack years: 2.50    Types: Cigarettes    Quit date: 11/29/1995    Years since quitting: 26.6    Passive exposure: Past   Smokeless tobacco: Former   Tobacco comments:    Quite 1997, didnt smoke hardly at all when I was smoking.  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]   Social Determinants of Health   Financial Resource Strain: Low Risk  (04/27/2022)   Overall Financial Resource Strain (CARDIA)    Difficulty of Paying Living Expenses: Not hard at all  Food Insecurity: No Food Insecurity (04/27/2022)   Hunger Vital Sign    Worried About Running Out  of Food in the Last Year: Never true    Ran Out of Food in the Last Year: Never true  Transportation Needs: No Transportation  Needs (04/27/2022)   PRAPARE - Administrator, Civil Service (Medical): No    Lack of Transportation (Non-Medical): No  Physical Activity: Inactive (04/27/2022)   Exercise Vital Sign    Days of Exercise per Week: 1 day    Minutes of Exercise per Session: 0 min  Stress: No Stress Concern Present (04/27/2022)   [employer] of Occupational Health - Occupational Stress Questionnaire    Feeling of Stress : Only a little  Social Connections: Unknown (04/27/2022)   Social Connection and Isolation Panel [NHANES]    Frequency of Communication with Friends and Family: More than three times a week    Frequency of Social Gatherings with Friends and Family: Once a week    Attends Religious Services: Patient declined    Database administrator or Organizations: No    Attends Engineer, structural: Not on file    Marital Status: Married  Catering manager Violence: Not At Risk (04/06/2021)   Humiliation, Afraid, Rape, and Kick questionnaire    Fear of Current or Ex-Partner: No    Emotionally Abused: No    Physically Abused: No    Sexually Abused: No    Allergies  Allergen Reactions   Flagyl [Metronidazole] Anaphylaxis   Cymbalta [Duloxetine Hcl] Anxiety   Tape Rash    Paper tape okay to use per patient.    Current Outpatient Medications  Medication Sig Dispense Refill   amoxicillin-clavulanate (AUGMENTIN) 875-125 MG tablet Take 1 tablet by mouth 2 (two) times daily. 20 tablet 0   aspirin EC (ASPIRIN LOW DOSE) 81 MG tablet TAKE 1 TABLET BY MOUTH EVERY DAY 90 tablet 0   azelastine (ASTELIN) 0.1 % nasal spray Place 2 sprays into both nostrils 2 (two) times daily. Use in each nostril as directed 30 mL 11   bisacodyl (DULCOLAX) 5 MG EC tablet Take 5 mg by mouth daily as needed for moderate constipation.     butalbital-acetaminophen-caffeine (FIORICET)  50-325-40 MG tablet Take 1 tablet by mouth 2 (two) times daily as needed for headache.     Cholecalciferol 250 MCG (10000 UT) CAPS Take 10,000 Units by mouth once a week.     cyanocobalamin (VITAMIN B12) 1000 MCG/ML injection Inject 1 mL (1,000 mcg total) into the muscle every 30 (thirty) days. 1 mL 11   diclofenac Sodium (VOLTAREN) 1 % GEL Apply 2 g topically 4 (four) times daily as needed (pain).     estradiol (ESTRACE VAGINAL) 0.1 MG/GM vaginal cream Apply pea sized amount externally nightly for 1 week, then once weekly as maintenance 42.5 g 0   estradiol (ESTRACE) 1 MG tablet TAKE 1 TABLET EVERY DAY (Patient taking differently: Take 1 mg by mouth daily.) 90 tablet 1   etodolac (LODINE) 500 MG tablet Take 1 tablet by mouth 2 (two) times daily.     ezetimibe (ZETIA) 10 MG tablet Take 1 tablet (10 mg total) by mouth daily. 90 tablet 3   fluconazole (DIFLUCAN) 150 MG tablet Take one tablet by mouth on Day 1. Repeat dose 2nd tablet on Day 3. 2 tablet 0   fluticasone (FLONASE) 50 MCG/ACT nasal spray SPRAY 2 SPRAYS INTO EACH NOSTRIL EVERY DAY 48 mL 0   furosemide (LASIX) 20 MG tablet Take 1 tablet (20 mg total) by mouth daily as needed for fluid or edema. 90 tablet 1   gabapentin (NEURONTIN) 600 MG tablet Take 1 tablet (600 mg total) by mouth in the morning, at noon, in the evening, and at bedtime.  360 tablet 3   hydrOXYzine (ATARAX) 25 MG tablet Take 1 tablet (25 mg total) by mouth every 8 (eight) hours as needed for anxiety. 270 tablet 1   omeprazole (PRILOSEC) 40 MG capsule Take 1 capsule (40 mg total) by mouth daily. 90 capsule 3   ondansetron (ZOFRAN ODT) 4 MG disintegrating tablet Take 1 tablet (4 mg total) by mouth every 8 (eight) hours as needed for nausea or vomiting. 30 tablet 2   Oxcarbazepine (TRILEPTAL) 300 MG tablet TAKE 1 TABLET BY MOUTH 2 TIMES DAILY. 180 tablet 0   oxyCODONE-acetaminophen (PERCOCET) 7.5-325 MG tablet Take 1 tablet by mouth every 6 (six) hours as needed for severe pain.      propranolol (INDERAL) 10 MG tablet TAKE 1 TABLET (10 MG TOTAL) BY MOUTH 3 (THREE) TIMES DAILY AS NEEDED. FOR ANXIETY ATTACKS 270 tablet 0   rizatriptan (MAXALT-MLT) 10 MG disintegrating tablet Take 1 tablet (10 mg total) by mouth as needed for migraine. May repeat in 2 hours if needed, that is max dose for 24 hours. 10 tablet 3   rosuvastatin (CRESTOR) 40 MG tablet TAKE 1 TABLET BY MOUTH EVERY DAY 90 tablet 1   tiZANidine (ZANAFLEX) 4 MG tablet Take 1 tablet (4 mg total) by mouth 4 (four) times daily. 360 tablet 1   traZODone (DESYREL) 100 MG tablet TAKE 1-1.5 TABLETS (100-150 MG TOTAL) BY MOUTH AT BEDTIME AS NEEDED FOR SLEEP. 135 tablet 0   valACYclovir (VALTREX) 1000 MG tablet TAKE 1 TABLET (1,000 MG TOTAL) BY MOUTH DAILY. FOR 5-7 DAYS, MAY REPEAT IF NEED FOR COLD SORE 90 tablet 1   venlafaxine XR (EFFEXOR-XR) 75 MG 24 hr capsule Take 1 capsule (75 mg total) by mouth daily with breakfast. 90 capsule 1   Zoledronic Acid (ZOMETA) 4 MG/100ML IVPB Once per year from Orthopedic 100 mL    No current facility-administered medications for this visit.    REVIEW OF SYSTEMS:  [X]  denotes positive finding, [ ]  denotes negative finding Cardiac  Comments:  Chest pain or chest pressure:    Shortness of breath upon exertion:    Short of breath when lying flat:    Irregular heart rhythm:        Vascular    Pain in calf, thigh, or hip brought on by ambulation:    Pain in feet at night that wakes you up from your sleep:     Blood clot in your veins:    Leg swelling:         Pulmonary    Oxygen at home:    Productive cough:     Wheezing:         Neurologic    Sudden weakness in arms or legs:     Sudden numbness in arms or legs:     Sudden onset of difficulty speaking or slurred speech:    Temporary loss of vision in one eye:     Problems with dizziness:         Gastrointestinal    Blood in stool:     Vomited blood:         Genitourinary    Burning when urinating:     Blood in urine:         Psychiatric    Major depression:         Hematologic    Bleeding problems:    Problems with blood clotting too easily:        Skin    Rashes or ulcers:  Constitutional    Fever or chills:      PHYSICAL EXAM: There were no vitals filed for this visit.  GENERAL: The patient is a well-nourished female, in no acute distress. The vital signs are documented above. CARDIAC: There is a regular rate and rhythm.  VASCULAR: *** PULMONARY: There is good air exchange bilaterally without wheezing or rales. ABDOMEN: Soft and non-tender with normal pitched bowel sounds.  MUSCULOSKELETAL: There are no major deformities or cyanosis. NEUROLOGIC: No focal weakness or paresthesias are detected. SKIN: There are no ulcers or rashes noted. PSYCHIATRIC: The patient has a normal affect.  DATA:   Ultrasound of her abdomen on 06/07/2022 showing no hernia but a 6.8 x 1.6 x 6.8 hypoechoic mass concerning for seroma.  Assessment/Plan:   67 y.o. female, that presents for evaluation of possible seroma after previous anterior exposure for spine surgery.  Patient had an L5-S1 ALIF on 12/18/2021.    Cephus Shelling, MD [facility] Office: [phone]

## 2022-07-17 ENCOUNTER — Ambulatory Visit (INDEPENDENT_AMBULATORY_CARE_PROVIDER_SITE_OTHER): Payer: Medicare HMO | Admitting: Vascular Surgery

## 2022-07-17 ENCOUNTER — Encounter: Payer: Self-pay | Admitting: Vascular Surgery

## 2022-07-17 VITALS — BP 78/56 | HR 74 | Temp 97.3°F | Resp 16 | Ht 62.0 in | Wt 169.0 lb

## 2022-07-17 DIAGNOSIS — Z981 Arthrodesis status: Secondary | ICD-10-CM | POA: Diagnosis not present

## 2022-07-19 ENCOUNTER — Encounter: Payer: Self-pay | Admitting: Family Medicine

## 2022-07-19 DIAGNOSIS — S301XXA Contusion of abdominal wall, initial encounter: Secondary | ICD-10-CM

## 2022-07-19 DIAGNOSIS — R1900 Intra-abdominal and pelvic swelling, mass and lump, unspecified site: Secondary | ICD-10-CM

## 2022-07-20 ENCOUNTER — Ambulatory Visit (INDEPENDENT_AMBULATORY_CARE_PROVIDER_SITE_OTHER): Payer: Medicare HMO

## 2022-07-20 VITALS — Ht 62.0 in | Wt 163.6 lb

## 2022-07-20 DIAGNOSIS — Z0000 Encounter for general adult medical examination without abnormal findings: Secondary | ICD-10-CM

## 2022-07-20 NOTE — Progress Notes (Addendum)
Subjective:   Ashley Fields is a 67 y.o. female who presents for Medicare Annual (Subsequent) preventive examination.  Visit Complete: Virtual  I connected with  Ashley Fields on 07/20/22 by a audio enabled telemedicine application and verified that I am speaking with the correct person using two identifiers.  Patient Location: Home  Provider Location: Office/Clinic  I discussed the limitations of evaluation and management by telemedicine. The patient expressed understanding and agreed to proceed.  Per patient no change in vitals since last visit, unable to obtain new vitals due to telehealth visit   Patient Medicare AWV questionnaire was completed by the patient on 07/16/2022; I have confirmed that all information answered by patient is correct and no changes since this date.  Review of Systems     Cardiac Risk Factors include: advanced age (>64men, >73 women)     Objective:    Today's Vitals   07/20/22 1111  Weight: 163 lb 9.6 oz (74.2 kg)  Height: 5\' 2"  (1.575 m)  PainSc: 4    Body mass index is 29.92 kg/m.     07/20/2022   11:21 AM 03/16/2022    2:24 PM 12/18/2021    6:58 AM 11/15/2021   10:36 AM 09/04/2021    1:58 PM 03/11/2021    8:03 PM 03/06/2021    1:01 PM  Advanced Directives  Does Patient Have a Medical Advance Directive? Yes No No No Yes Yes Yes  Type of Therapist, art of [employer] Power of Attorney  Does patient want to make changes to medical advance directive?     No - Patient declined    Copy of Healthcare Power of Attorney in Chart? No - copy requested        Would patient like information on creating a medical advance directive?  No - Patient declined No - Patient declined No - Patient declined       Current Medications (verified) Outpatient Encounter Medications as of 07/20/2022  Medication Sig   aspirin EC (ASPIRIN LOW DOSE) 81 MG tablet TAKE 1 TABLET BY MOUTH EVERY DAY   azelastine (ASTELIN)  0.1 % nasal spray Place 2 sprays into both nostrils 2 (two) times daily. Use in each nostril as directed   bisacodyl (DULCOLAX) 5 MG EC tablet Take 5 mg by mouth daily as needed for moderate constipation.   butalbital-acetaminophen-caffeine (FIORICET) 50-325-40 MG tablet Take 1 tablet by mouth 2 (two) times daily as needed for headache.   Cholecalciferol 250 MCG (10000 UT) CAPS Take 10,000 Units by mouth once a week.   cyanocobalamin (VITAMIN B12) 1000 MCG/ML injection Inject 1 mL (1,000 mcg total) into the muscle every 30 (thirty) days.   diclofenac Sodium (VOLTAREN) 1 % GEL Apply 2 g topically 4 (four) times daily as needed (pain).   estradiol (ESTRACE VAGINAL) 0.1 MG/GM vaginal cream Apply pea sized amount externally nightly for 1 week, then once weekly as maintenance   etodolac (LODINE) 500 MG tablet Take 1 tablet by mouth 2 (two) times daily.   ezetimibe (ZETIA) 10 MG tablet Take 1 tablet (10 mg total) by mouth daily.   fluticasone (FLONASE) 50 MCG/ACT nasal spray SPRAY 2 SPRAYS INTO EACH NOSTRIL EVERY DAY   furosemide (LASIX) 20 MG tablet Take 1 tablet (20 mg total) by mouth daily as needed for fluid or edema.   gabapentin (NEURONTIN) 600 MG tablet Take 1 tablet (600 mg total) by mouth in the morning, at noon, in  the evening, and at bedtime.   hydrOXYzine (ATARAX) 25 MG tablet Take 1 tablet (25 mg total) by mouth every 8 (eight) hours as needed for anxiety.   omeprazole (PRILOSEC) 40 MG capsule Take 1 capsule (40 mg total) by mouth daily.   ondansetron (ZOFRAN ODT) 4 MG disintegrating tablet Take 1 tablet (4 mg total) by mouth every 8 (eight) hours as needed for nausea or vomiting.   Oxcarbazepine (TRILEPTAL) 300 MG tablet TAKE 1 TABLET BY MOUTH 2 TIMES DAILY.   oxyCODONE-acetaminophen (PERCOCET) 7.5-325 MG tablet Take 1 tablet by mouth every 6 (six) hours as needed for severe pain.   propranolol (INDERAL) 10 MG tablet TAKE 1 TABLET (10 MG TOTAL) BY MOUTH 3 (THREE) TIMES DAILY AS NEEDED. FOR  ANXIETY ATTACKS   rosuvastatin (CRESTOR) 40 MG tablet TAKE 1 TABLET BY MOUTH EVERY DAY   tiZANidine (ZANAFLEX) 4 MG tablet Take 1 tablet (4 mg total) by mouth 4 (four) times daily.   traZODone (DESYREL) 100 MG tablet TAKE 1-1.5 TABLETS (100-150 MG TOTAL) BY MOUTH AT BEDTIME AS NEEDED FOR SLEEP.   valACYclovir (VALTREX) 1000 MG tablet TAKE 1 TABLET (1,000 MG TOTAL) BY MOUTH DAILY. FOR 5-7 DAYS, MAY REPEAT IF NEED FOR COLD SORE   venlafaxine XR (EFFEXOR-XR) 75 MG 24 hr capsule Take 1 capsule (75 mg total) by mouth daily with breakfast.   Zoledronic Acid (ZOMETA) 4 MG/100ML IVPB Once per year from [facility]   amoxicillin-clavulanate (AUGMENTIN) 875-125 MG tablet Take 1 tablet by mouth 2 (two) times daily. (Patient not taking: Reported on 07/20/2022)   estradiol (ESTRACE) 1 MG tablet TAKE 1 TABLET EVERY DAY (Patient not taking: Reported on 07/20/2022)   fluconazole (DIFLUCAN) 150 MG tablet Take one tablet by mouth on Day 1. Repeat dose 2nd tablet on Day 3. (Patient not taking: Reported on 07/20/2022)   rizatriptan (MAXALT-MLT) 10 MG disintegrating tablet Take 1 tablet (10 mg total) by mouth as needed for migraine. May repeat in 2 hours if needed, that is max dose for 24 hours. (Patient not taking: Reported on 07/20/2022)   No facility-administered encounter medications on file as of 07/20/2022.    Allergies (verified) Flagyl [metronidazole], Cymbalta [duloxetine hcl], and Tape   History: Past Medical History:  Diagnosis Date   Allergy    Anemia    Anxiety    Arthritis    Yes   Back pain    Blood transfusion without reported diagnosis 1977   Transfusions from miscarriage & hemorrhaging   Chronic kidney disease    Depression    Disp fx of cuboid bone of right foot, init for clos fx 01/01/2017   Fibromyalgia    GERD (gastroesophageal reflux disease)    Headache    Hepatitis C    Hyperlipidemia    Lumbar radiculopathy    Neck pain    Neuromuscular disorder (HCC) 1997   Falling to much was  an eye-opener   Osteoporosis    Thyroid disease    Urine incontinence    Past Surgical History:  Procedure Laterality Date   5 miscarriages     1977-1985, Blood transfusion s/p miscarriage 1977   ABDOMINAL EXPOSURE N/A 12/18/2021   Procedure: ABDOMINAL EXPOSURE;  Surgeon: Cephus Shelling, MD;  Location: [facility] OR;  Service: Vascular;  Laterality: N/A;   ABDOMINAL HYSTERECTOMY  1987   Total   ANTERIOR LUMBAR FUSION N/A 12/18/2021   Procedure: Anterior Lumbar Interbody Fusion  - Lumbar Five-Sacral One;  Surgeon: Tia Alert, MD;  Location: [facility]  OR;  Service: Neurosurgery;  Laterality: N/A;   APPENDECTOMY  12/2008   AUGMENTATION MAMMAPLASTY Bilateral 2015   Bilat   AUGMENTATION MAMMAPLASTY Bilateral 2018   implants redone w/ placement of implants under muscle   breast lift bilateral, implants  05/18/2015   bilateral, silicon naturel   BREAST REDUCTION SURGERY Bilateral 1997   CESAREAN SECTION  07/27/1981   Placenta Previa   CHOLECYSTECTOMY  03/2004   Lap surgery   COLONOSCOPY WITH PROPOFOL N/A 06/13/2020   Procedure: COLONOSCOPY WITH PROPOFOL;  Surgeon: Pasty Spillers, MD;  Location: [facility] ENDOSCOPY;  Service: Endoscopy;  Laterality: N/A;   COSMETIC SURGERY     Breast implants w/ lift, tummy tuck, upper & lower Blepharop   ESOPHAGOGASTRODUODENOSCOPY (EGD) WITH PROPOFOL N/A 06/13/2020   Procedure: ESOPHAGOGASTRODUODENOSCOPY (EGD) WITH PROPOFOL;  Surgeon: Pasty Spillers, MD;  Location: [facility] ENDOSCOPY;  Service: Endoscopy;  Laterality: N/A;   EYE SURGERY  2017   Cataract surgery & lasik   GASTRIC BYPASS  2005   Laparoscopic   IVC FILTER INSERTION  12/2003   TrapEase Vena Cava Filter   LUMBAR LAMINECTOMY  06/2006   L4-L5 (spinal fusion)   mini tummy tuck  05/07/2016   Bilateral bra/back roll lift skin removal   neck fusion     c3-c7 cadavier bones and metal plates placed per patient   neck surgery C3-C7  02/10/2015   ACDF   PROSTATE SURGERY     REDUCTION  MAMMAPLASTY Bilateral 1997   SHOULDER ACROMIOPLASTY Left 03/27/2013   w/ labral debridement   SMALL INTESTINE SURGERY  12/24/2003   Lap Gastric Bypass   SPINAL CORD STIMULATOR IMPLANT  11/2010   removed 05/2011   SPINE SURGERY  2008 2017   Fusion L4-L5, ACDF C4-C7   Family History  Problem Relation Age of Onset   COPD Mother    Lung cancer Mother 48   Diabetes Mother    Heart disease Mother    Stroke Mother    Heart attack Mother    Arthritis Mother    Hypertension Mother    Obesity Mother    Varicose Veins Mother    Heart disease Father    Heart attack Father 70   Early death Father    Depression Sister        x 5 sisters   COPD Sister    Hypertension Sister    Colon polyps Sister    Hearing loss Sister    Heart attack Sister    Arthritis Sister    Varicose Veins Sister    COPD Sister    Obesity Sister    Depression Sister    Depression Sister    Heart disease Sister    Hypertension Sister    Hypertension Sister    Obesity Sister    Heart disease Brother        x 3 brothers   Diabetes Brother    Heart attack Brother    Arthritis Brother    Heart disease Brother    Hypertension Brother    Diabetes Brother    Heart disease Brother    Hypertension Brother    Obesity Brother    Varicose Veins Brother    Heart disease Brother    Hypertension Brother    Obesity Brother    Miscarriages / Stillbirths Maternal Aunt    Miscarriages / Stillbirths Paternal Aunt    Breast cancer Paternal Aunt        early years   Cancer Maternal Grandfather  Stroke Maternal Grandfather    Social History   Socioeconomic History   Marital status: Married    Spouse name: Not on file   Number of children: 1   Years of education: High School   Highest education level: Some college, no degree  Occupational History   Occupation: disability  Tobacco Use   Smoking status: Former    Current packs/day: 0.00    Average packs/day: 0.3 packs/day for 10.0 years (2.5 ttl pk-yrs)     Types: Cigarettes    Start date: 11/28/1985    Quit date: 11/29/1995    Years since quitting: 26.6    Passive exposure: Past   Smokeless tobacco: Former   Tobacco comments:    Quite 1997, didnt smoke hardly at all when I was smoking.  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]   Social Determinants of Health   Financial Resource Strain: Low Risk  (07/16/2022)   Overall Financial Resource Strain (CARDIA)    Difficulty of Paying Living Expenses: Not hard at all  Food Insecurity: No Food Insecurity (07/16/2022)   Hunger Vital Sign    Worried About Running Out of Food in the Last Year: Never true    Ran Out of Food in the Last Year: Never true  Transportation Needs: No Transportation Needs (07/16/2022)   PRAPARE - Administrator, Civil Service (Medical): No    Lack of Transportation (Non-Medical): No  Physical Activity: Insufficiently Active (07/16/2022)   Exercise Vital Sign    Days of Exercise per Week: 3 days    Minutes of Exercise per Session: 30 min  Stress: Stress Concern Present (07/16/2022)   [employer] of Occupational Health - Occupational Stress Questionnaire    Feeling of Stress : Very much  Social Connections: Unknown (07/16/2022)   Social Connection and Isolation Panel [NHANES]    Frequency of Communication with Friends and Family: Once a week    Frequency of Social Gatherings with Friends and Family: Never    Attends Religious Services: Patient declined    Database administrator or Organizations: No    Attends Banker Meetings: Never    Marital Status: Married    Tobacco  Counseling Counseling given: Not Answered Tobacco comments: Quite 1997, didnt smoke hardly at all when I was smoking.   Clinical Intake:  Pre-visit preparation completed: Yes  Pain : 0-10 Pain Score: 4  Pain Type: Chronic pain Pain Location: Back (knees) Pain Orientation: Lower Pain Descriptors / Indicators: Aching Pain Onset: More than a month ago Pain Frequency: Constant     Nutritional Status: BMI 25 -29 Overweight Nutritional Risks: None Diabetes: No  How often do you need to have someone help you when you read instructions, pamphlets, or other written materials from your doctor or pharmacy?: 1 - Never  Interpreter Needed?: No  Information entered by :: NAllen LPN   Activities of Daily Living    07/16/2022    5:07 PM 05/01/2022    8:04 AM  In your present state of health, do you have any difficulty performing the following activities:  Hearing? 0  0  Vision? 0 0  Difficulty concentrating or making decisions? 0 0  Walking or climbing stairs? 1 1  Comment due to knees and back   Dressing or bathing? 0 0  Doing errands, shopping? 0 0  Preparing Food and eating ? Y   Using the Toilet? N   In the past six months, have you accidently leaked urine? Y   Do you have problems with loss of bowel control? N   Managing your Medications? N   Managing your Finances? N   Housekeeping or managing your Housekeeping? Y     Patient Care Team: Smitty Cords, DO as PCP - General (Family Medicine)  Indicate any recent Medical Services you may have received from other than [facility] providers in the past year (date may be approximate).     Assessment:   This is a routine wellness examination for Hinley.  Hearing/Vision screen Hearing Screening - Comments:: Denies hearing issues Vision Screening - Comments:: Regular eye exams, [facility]  Dietary issues and exercise activities discussed:     Goals Addressed             This Visit's Progress    Patient  Stated       07/20/2022, lose more weight, be more active, and not have pain       Depression Screen    07/20/2022   11:22 AM 05/01/2022    8:04 AM 01/15/2022   11:45 AM 12/27/2021    8:22 AM 11/09/2021    8:26 AM 10/24/2021   11:30 AM 09/12/2021    2:13 PM  PHQ 2/9 Scores  PHQ - 2 Score 0 0  1 3  1   PHQ- 9 Score 0   2 3  3      Information is confidential and restricted. Go to Review Flowsheets to unlock data.    Fall Risk    07/16/2022    5:07 PM 05/01/2022    8:04 AM 12/27/2021    8:22 AM 11/09/2021    8:26 AM 09/12/2021    2:13 PM  Fall Risk   Falls in the past year? 1 1 1  0 0  Number falls in past yr: 0 0 0 0 0  Injury with Fall? 1  0 0 0  Risk for fall due to : History of fall(s);Medication side effect  History of fall(s) Impaired balance/gait;Impaired mobility Impaired balance/gait;Impaired mobility  Follow up Falls prevention discussed;Education provided;Falls evaluation completed  Falls evaluation completed Falls evaluation completed Falls evaluation completed    MEDICARE RISK AT HOME:   TIMED UP AND GO:  Was the test performed?  No    Cognitive Function:        07/20/2022   11:23 AM 04/06/2021   10:00 AM 03/29/2020   11:12 AM  6CIT Screen  What Year? 0 points 0 points 0 points  What month? 0 points 0 points 0 points  What time? 0 points 0 points 0 points  Count back from 20 0 points 0 points 0 points  Months in reverse 0 points 0 points 0 points  Repeat phrase 0 points 0 points 0 points  Total Score 0 points 0 points 0 points    Immunizations Immunization History  Administered Date(s) Administered   Fluad Quad(high Dose 65+) 09/13/2021   Influenza Inj Mdck Quad Pf 09/15/2020   Influenza,inj,Quad PF,6+ Mos 09/12/2017, 08/21/2018, 09/12/2019   Influenza,inj,quad, With Preservative 10/21/2018   Influenza-Unspecified 09/12/2017   PFIZER Comirnaty(Gray Top)Covid-19 Tri-Sucrose Vaccine 02/13/2020, 06/30/2020  PFIZER(Purple Top)SARS-COV-2 Vaccination  08/11/2019, 09/01/2019   PNEUMOCOCCAL CONJUGATE-20 06/30/2020   Pfizer Covid-19 Vaccine Bivalent Booster 63yrs & up 12/19/2020   Respiratory Syncytial Virus Vaccine,Recomb Aduvanted(Arexvy) 10/25/2021   Tdap 07/12/2017   Zoster Recombinant(Shingrix) 04/13/2021, 09/13/2021    TDAP status: Up to date  Flu Vaccine status: Up to date  Pneumococcal vaccine status: Up to date  Covid-19 vaccine status: Information provided on how to obtain vaccines.   Qualifies for Shingles Vaccine? Yes   Zostavax completed Yes   Shingrix Completed?: Yes  Screening Tests Health Maintenance  Topic Date Due   COVID-19 Vaccine (6 - 2023-24 season) 09/08/2021   INFLUENZA VACCINE  08/09/2022   Medicare Annual Wellness (AWV)  07/20/2023   MAMMOGRAM  09/06/2023   DTaP/Tdap/Td (2 - Td or Tdap) 07/13/2027   Colonoscopy  06/14/2030   Pneumonia Vaccine 71+ Years old  Completed   DEXA SCAN  Completed   Hepatitis C Screening  Completed   Zoster Vaccines- Shingrix  Completed   HPV VACCINES  Aged Out   Fecal DNA (Cologuard)  Discontinued    Health Maintenance  Health Maintenance Due  Topic Date Due   COVID-19 Vaccine (6 - 2023-24 season) 09/08/2021    Colorectal cancer screening: Type of screening: Colonoscopy. Completed 06/13/2020. Repeat every 5-10 years  Mammogram status: Completed 09/05/2021. Repeat every year  Bone Density status: Completed 08/24/2019  Lung Cancer Screening: (Low Dose CT Chest recommended if Age 57-80 years, 20 pack-year currently smoking OR have quit w/in 15years.) does not qualify.   Lung Cancer Screening Referral: no  Additional Screening:  Hepatitis C Screening: does qualify; Completed 11/15/2021  Vision Screening: Recommended annual ophthalmology exams for early detection of glaucoma and other disorders of the eye. Is the patient up to date with their annual eye exam?  Yes  Who is the provider or what is the name of the office in which the patient attends annual eye exams?  [facility] If pt is not established with a provider, would they like to be referred to a provider to establish care? No .   Dental Screening: Recommended annual dental exams for proper oral hygiene  Diabetic Foot Exam: n/a  Community Resource Referral / Chronic Care Management: CRR required this visit?  No   CCM required this visit?  No     Plan:     I have personally reviewed and noted the following in the patient's chart:   Medical and social history Use of alcohol, tobacco or illicit drugs  Current medications and supplements including opioid prescriptions. Patient is currently taking opioid prescriptions. Information provided to patient regarding non-opioid alternatives. Patient advised to discuss non-opioid treatment plan with their provider. Functional ability and status Nutritional status Physical activity Advanced directives List of other physicians Hospitalizations, surgeries, and ER visits in previous 12 months Vitals Screenings to include cognitive, depression, and falls Referrals and appointments  In addition, I have reviewed and discussed with patient certain preventive protocols, quality metrics, and best practice recommendations. A written personalized care plan for preventive services as well as general preventive health recommendations were provided to patient.     Barb Merino, LPN   [id number]   After Visit Summary: (MyChart) Due to this being a telephonic visit, the after visit summary with patients personalized plan was offered to patient via MyChart   Nurse Notes: none

## 2022-07-20 NOTE — Patient Instructions (Signed)
Ashley Fields , Thank you for taking time to come for your Medicare Wellness Visit. I appreciate your ongoing commitment to your health goals. Please review the following plan we discussed and let me know if I can assist you in the future.   These are the goals we discussed:  Goals      Have 3 meals a day     Pt would like to go back to [place] to visit family.      Patient Stated     03/29/2020, get off some medications     Patient Stated     07/20/2022, lose more weight, be more active, and not have pain        This is a list of the screening recommended for you and due dates:  Health Maintenance  Topic Date Due   COVID-19 Vaccine (6 - 2023-24 season) 09/08/2021   Flu Shot  08/09/2022   Medicare Annual Wellness Visit  07/20/2023   Mammogram  09/06/2023   DTaP/Tdap/Td vaccine (2 - Td or Tdap) 07/13/2027   Colon Cancer Screening  06/14/2030   Pneumonia Vaccine  Completed   DEXA scan (bone density measurement)  Completed   Hepatitis C Screening  Completed   Zoster (Shingles) Vaccine  Completed   HPV Vaccine  Aged Out   Cologuard (Stool DNA test)  Discontinued    Advanced directives: Advance directive discussed with you today.   Conditions/risks identified: none  Next appointment: Follow up in one year for your annual wellness visit    Preventive Care 65 Years and Older, Female Preventive care refers to lifestyle choices and visits with your health care provider that can promote health and wellness. What does preventive care include? A yearly physical exam. This is also called an annual well check. Dental exams once or twice a year. Routine eye exams. Ask your health care provider how often you should have your eyes checked. Personal lifestyle choices, including: Daily care of your teeth and gums. Regular physical activity. Eating a healthy diet. Avoiding tobacco and drug use. Limiting alcohol use. Practicing safe sex. Taking low-dose aspirin every day. Taking vitamin  and mineral supplements as recommended by your health care provider. What happens during an annual well check? The services and screenings done by your health care provider during your annual well check will depend on your age, overall health, lifestyle risk factors, and family history of disease. Counseling  Your health care provider may ask you questions about your: Alcohol use. Tobacco use. Drug use. Emotional well-being. Home and relationship well-being. Sexual activity. Eating habits. History of falls. Memory and ability to understand (cognition). Work and work Astronomer. Reproductive health. Screening  You may have the following tests or measurements: Height, weight, and BMI. Blood pressure. Lipid and cholesterol levels. These may be checked every 5 years, or more frequently if you are over 11 years old. Skin check. Lung cancer screening. You may have this screening every year starting at age 87 if you have a 30-pack-year history of smoking and currently smoke or have quit within the past 15 years. Fecal occult blood test (FOBT) of the stool. You may have this test every year starting at age 82. Flexible sigmoidoscopy or colonoscopy. You may have a sigmoidoscopy every 5 years or a colonoscopy every 10 years starting at age 58. Hepatitis C blood test. Hepatitis B blood test. Sexually transmitted disease (STD) testing. Diabetes screening. This is done by checking your blood sugar (glucose) after you have not eaten for a  while (fasting). You may have this done every 1-3 years. Bone density scan. This is done to screen for osteoporosis. You may have this done starting at age 64. Mammogram. This may be done every 1-2 years. Talk to your health care provider about how often you should have regular mammograms. Talk with your health care provider about your test results, treatment options, and if necessary, the need for more tests. Vaccines  Your health care provider may recommend  certain vaccines, such as: Influenza vaccine. This is recommended every year. Tetanus, diphtheria, and acellular pertussis (Tdap, Td) vaccine. You may need a Td booster every 10 years. Zoster vaccine. You may need this after age 87. Pneumococcal 13-valent conjugate (PCV13) vaccine. One dose is recommended after age 73. Pneumococcal polysaccharide (PPSV23) vaccine. One dose is recommended after age 46. Talk to your health care provider about which screenings and vaccines you need and how often you need them. This information is not intended to replace advice given to you by your health care provider. Make sure you discuss any questions you have with your health care provider. Document Released: 01/21/2015 Document Revised: 09/14/2015 Document Reviewed: 10/26/2014 Elsevier Interactive Patient Education  2017 [employer].  Fall Prevention in the Home Falls can cause injuries. They can happen to people of all ages. There are many things you can do to make your home safe and to help prevent falls. What can I do on the outside of my home? Regularly fix the edges of walkways and driveways and fix any cracks. Remove anything that might make you trip as you walk through a door, such as a raised step or threshold. Trim any bushes or trees on the path to your home. Use bright outdoor lighting. Clear any walking paths of anything that might make someone trip, such as rocks or tools. Regularly check to see if handrails are loose or broken. Make sure that both sides of any steps have handrails. Any raised decks and porches should have guardrails on the edges. Have any leaves, snow, or ice cleared regularly. Use sand or salt on walking paths during winter. Clean up any spills in your garage right away. This includes oil or grease spills. What can I do in the bathroom? Use night lights. Install grab bars by the toilet and in the tub and shower. Do not use towel bars as grab bars. Use non-skid mats or  decals in the tub or shower. If you need to sit down in the shower, use a plastic, non-slip stool. Keep the floor dry. Clean up any water that spills on the floor as soon as it happens. Remove soap buildup in the tub or shower regularly. Attach bath mats securely with double-sided non-slip rug tape. Do not have throw rugs and other things on the floor that can make you trip. What can I do in the bedroom? Use night lights. Make sure that you have a light by your bed that is easy to reach. Do not use any sheets or blankets that are too big for your bed. They should not hang down onto the floor. Have a firm chair that has side arms. You can use this for support while you get dressed. Do not have throw rugs and other things on the floor that can make you trip. What can I do in the kitchen? Clean up any spills right away. Avoid walking on wet floors. Keep items that you use a lot in easy-to-reach places. If you need to reach something above you,  use a strong step stool that has a grab bar. Keep electrical cords out of the way. Do not use floor polish or wax that makes floors slippery. If you must use wax, use non-skid floor wax. Do not have throw rugs and other things on the floor that can make you trip. What can I do with my stairs? Do not leave any items on the stairs. Make sure that there are handrails on both sides of the stairs and use them. Fix handrails that are broken or loose. Make sure that handrails are as long as the stairways. Check any carpeting to make sure that it is firmly attached to the stairs. Fix any carpet that is loose or worn. Avoid having throw rugs at the top or bottom of the stairs. If you do have throw rugs, attach them to the floor with carpet tape. Make sure that you have a light switch at the top of the stairs and the bottom of the stairs. If you do not have them, ask someone to add them for you. What else can I do to help prevent falls? Wear shoes that: Do not  have high heels. Have rubber bottoms. Are comfortable and fit you well. Are closed at the toe. Do not wear sandals. If you use a stepladder: Make sure that it is fully opened. Do not climb a closed stepladder. Make sure that both sides of the stepladder are locked into place. Ask someone to hold it for you, if possible. Clearly mark and make sure that you can see: Any grab bars or handrails. First and last steps. Where the edge of each step is. Use tools that help you move around (mobility aids) if they are needed. These include: Canes. Walkers. Scooters. Crutches. Turn on the lights when you go into a dark area. Replace any light bulbs as soon as they burn out. Set up your furniture so you have a clear path. Avoid moving your furniture around. If any of your floors are uneven, fix them. If there are any pets around you, be aware of where they are. Review your medicines with your doctor. Some medicines can make you feel dizzy. This can increase your chance of falling. Ask your doctor what other things that you can do to help prevent falls. This information is not intended to replace advice given to you by your health care provider. Make sure you discuss any questions you have with your health care provider. Document Released: 10/21/2008 Document Revised: 06/02/2015 Document Reviewed: 01/29/2014 Elsevier Interactive Patient Education  2017 [employer].

## 2022-07-23 NOTE — Addendum Note (Signed)
Addended by: Lorre Munroe on: 07/23/2022 10:38 AM   Modules accepted: Orders

## 2022-07-24 ENCOUNTER — Other Ambulatory Visit: Payer: Self-pay | Admitting: Family Medicine

## 2022-07-24 DIAGNOSIS — Z1231 Encounter for screening mammogram for malignant neoplasm of breast: Secondary | ICD-10-CM

## 2022-07-25 ENCOUNTER — Encounter: Payer: Self-pay | Admitting: Internal Medicine

## 2022-07-25 NOTE — Progress Notes (Signed)
Patient ID: Ashley Fields, female   DOB: 1955-05-12, 67 y.o.   MRN: [id number]  Chief Complaint:  Abdominalwall mass  History of Present Illness Ashley Fields is a 67 y.o. female with a recently noted abd wall mass. Present since May, reports weight loss of 49 lbs, recently, intentional.  Notes nausea.  Denies F/C and vomiting.  Bowel activity as usual.  Occasional pain.  Numerous operations including bariatric surgery and abdomino-plasty.   Past Medical History Past Medical History:  Diagnosis Date   Allergy    Anemia    Anxiety    Arthritis    Yes   Back pain    Blood transfusion without reported diagnosis 1977   Transfusions from miscarriage & hemorrhaging   Chronic kidney disease    Depression    Disp fx of cuboid bone of right foot, init for clos fx 01/01/2017   Fibromyalgia    GERD (gastroesophageal reflux disease)    Headache    Hepatitis C    Hyperlipidemia    Lumbar radiculopathy    Neck pain    Neuromuscular disorder (HCC) 1997   Falling to much was an eye-opener   Osteoporosis    Thyroid disease    Urine incontinence       Past Surgical History:  Procedure Laterality Date   5 miscarriages     1977-1985, Blood transfusion s/p miscarriage 1977   ABDOMINAL EXPOSURE N/A 12/18/2021   Procedure: ABDOMINAL EXPOSURE;  Surgeon: Cephus Shelling, MD;  Location: [facility] OR;  Service: Vascular;  Laterality: N/A;   ABDOMINAL HYSTERECTOMY  1987   Total   ANTERIOR LUMBAR FUSION N/A 12/18/2021   Procedure: Anterior Lumbar Interbody Fusion  - Lumbar Five-Sacral One;  Surgeon: Tia Alert, MD;  Location: [facility] OR;  Service: Neurosurgery;  Laterality: N/A;   APPENDECTOMY  12/2008   AUGMENTATION MAMMAPLASTY Bilateral 2015   Bilat   AUGMENTATION MAMMAPLASTY Bilateral 2018   implants redone w/ placement of implants under muscle   breast lift bilateral, implants  05/18/2015   bilateral, silicon naturel   BREAST REDUCTION SURGERY Bilateral 1997   CESAREAN SECTION  07/27/1981    Placenta Previa   CHOLECYSTECTOMY  03/2004   Lap surgery   COLONOSCOPY WITH PROPOFOL N/A 06/13/2020   Procedure: COLONOSCOPY WITH PROPOFOL;  Surgeon: Pasty Spillers, MD;  Location: [facility] ENDOSCOPY;  Service: Endoscopy;  Laterality: N/A;   COSMETIC SURGERY     Breast implants w/ lift, tummy tuck, upper & lower Blepharop   ESOPHAGOGASTRODUODENOSCOPY (EGD) WITH PROPOFOL N/A 06/13/2020   Procedure: ESOPHAGOGASTRODUODENOSCOPY (EGD) WITH PROPOFOL;  Surgeon: Pasty Spillers, MD;  Location: [facility] ENDOSCOPY;  Service: Endoscopy;  Laterality: N/A;   EYE SURGERY  2017   Cataract surgery & lasik   GASTRIC BYPASS  2005   Laparoscopic   IVC FILTER INSERTION  12/2003   TrapEase Vena Cava Filter   LUMBAR LAMINECTOMY  06/2006   L4-L5 (spinal fusion)   mini tummy tuck  05/07/2016   Bilateral bra/back roll lift skin removal   neck fusion     c3-c7 cadavier bones and metal plates placed per patient   neck surgery C3-C7  02/10/2015   ACDF   PROSTATE SURGERY     REDUCTION MAMMAPLASTY Bilateral 1997   SHOULDER ACROMIOPLASTY Left 03/27/2013   w/ labral debridement   SMALL INTESTINE SURGERY  12/24/2003   Lap Gastric Bypass   SPINAL CORD STIMULATOR IMPLANT  11/2010   removed 05/2011   SPINE SURGERY  2008 2017  Fusion L4-L5, ACDF C4-C7    Allergies  Allergen Reactions   Flagyl [Metronidazole] Anaphylaxis   Cymbalta [Duloxetine Hcl] Anxiety   Tape Rash    Paper tape okay to use per patient.    Current Outpatient Medications  Medication Sig Dispense Refill   aspirin EC (ASPIRIN LOW DOSE) 81 MG tablet TAKE 1 TABLET BY MOUTH EVERY DAY 90 tablet 0   azelastine (ASTELIN) 0.1 % nasal spray Place 2 sprays into both nostrils 2 (two) times daily. Use in each nostril as directed 30 mL 11   bisacodyl (DULCOLAX) 5 MG EC tablet Take 5 mg by mouth daily as needed for moderate constipation.     butalbital-acetaminophen-caffeine (FIORICET) 50-325-40 MG tablet Take 1 tablet by mouth 2 (two) times daily  as needed for headache.     Cholecalciferol 250 MCG (10000 UT) CAPS Take 10,000 Units by mouth once a week.     cyanocobalamin (VITAMIN B12) 1000 MCG/ML injection Inject 1 mL (1,000 mcg total) into the muscle every 30 (thirty) days. 1 mL 11   diclofenac Sodium (VOLTAREN) 1 % GEL Apply 2 g topically 4 (four) times daily as needed (pain).     estradiol (ESTRACE VAGINAL) 0.1 MG/GM vaginal cream Apply pea sized amount externally nightly for 1 week, then once weekly as maintenance 42.5 g 0   estradiol (ESTRACE) 1 MG tablet TAKE 1 TABLET EVERY DAY 90 tablet 1   etodolac (LODINE) 500 MG tablet Take 1 tablet by mouth 2 (two) times daily.     ezetimibe (ZETIA) 10 MG tablet Take 1 tablet (10 mg total) by mouth daily. 90 tablet 3   fluticasone (FLONASE) 50 MCG/ACT nasal spray SPRAY 2 SPRAYS INTO EACH NOSTRIL EVERY DAY 48 mL 0   furosemide (LASIX) 20 MG tablet Take 1 tablet (20 mg total) by mouth daily as needed for fluid or edema. 90 tablet 1   gabapentin (NEURONTIN) 600 MG tablet Take 1 tablet (600 mg total) by mouth in the morning, at noon, in the evening, and at bedtime. 360 tablet 3   hydrOXYzine (ATARAX) 25 MG tablet Take 1 tablet (25 mg total) by mouth every 8 (eight) hours as needed for anxiety. 270 tablet 1   omeprazole (PRILOSEC) 40 MG capsule Take 1 capsule (40 mg total) by mouth daily. 90 capsule 3   ondansetron (ZOFRAN ODT) 4 MG disintegrating tablet Take 1 tablet (4 mg total) by mouth every 8 (eight) hours as needed for nausea or vomiting. 30 tablet 2   Oxcarbazepine (TRILEPTAL) 300 MG tablet TAKE 1 TABLET BY MOUTH 2 TIMES DAILY. 180 tablet 0   oxyCODONE-acetaminophen (PERCOCET) 7.5-325 MG tablet Take 1 tablet by mouth every 6 (six) hours as needed for severe pain.     propranolol (INDERAL) 10 MG tablet TAKE 1 TABLET (10 MG TOTAL) BY MOUTH 3 (THREE) TIMES DAILY AS NEEDED. FOR ANXIETY ATTACKS 270 tablet 0   rizatriptan (MAXALT-MLT) 10 MG disintegrating tablet Take 1 tablet (10 mg total) by mouth as  needed for migraine. May repeat in 2 hours if needed, that is max dose for 24 hours. 10 tablet 3   rosuvastatin (CRESTOR) 40 MG tablet TAKE 1 TABLET BY MOUTH EVERY DAY 90 tablet 1   tiZANidine (ZANAFLEX) 4 MG tablet Take 1 tablet (4 mg total) by mouth 4 (four) times daily. 360 tablet 1   traZODone (DESYREL) 100 MG tablet TAKE 1-1.5 TABLETS (100-150 MG TOTAL) BY MOUTH AT BEDTIME AS NEEDED FOR SLEEP. 135 tablet 0   valACYclovir (VALTREX)  1000 MG tablet TAKE 1 TABLET (1,000 MG TOTAL) BY MOUTH DAILY. FOR 5-7 DAYS, MAY REPEAT IF NEED FOR COLD SORE 90 tablet 1   venlafaxine XR (EFFEXOR-XR) 75 MG 24 hr capsule Take 1 capsule (75 mg total) by mouth daily with breakfast. 90 capsule 1   Zoledronic Acid (ZOMETA) 4 MG/100ML IVPB Once per year from Orthopedic 100 mL    No current facility-administered medications for this visit.    Family History Family History  Problem Relation Age of Onset   COPD Mother    Lung cancer Mother 40   Diabetes Mother    Heart disease Mother    Stroke Mother    Heart attack Mother    Arthritis Mother    Hypertension Mother    Obesity Mother    Varicose Veins Mother    Heart disease Father    Heart attack Father 1   Early death Father    Depression Sister        x 5 sisters   COPD Sister    Hypertension Sister    Colon polyps Sister    Hearing loss Sister    Heart attack Sister    Arthritis Sister    Varicose Veins Sister    COPD Sister    Obesity Sister    Depression Sister    Depression Sister    Heart disease Sister    Hypertension Sister    Hypertension Sister    Obesity Sister    Heart disease Brother        x 3 brothers   Diabetes Brother    Heart attack Brother    Arthritis Brother    Heart disease Brother    Hypertension Brother    Diabetes Brother    Heart disease Brother    Hypertension Brother    Obesity Brother    Varicose Veins Brother    Heart disease Brother    Hypertension Brother    Obesity Brother    Miscarriages /  Stillbirths Maternal Aunt    Miscarriages / Stillbirths Paternal Aunt    Breast cancer Paternal Aunt        early years   Cancer Maternal Grandfather    Stroke Maternal Grandfather       Social History Social History   Tobacco Use   Smoking status: Former    Current packs/day: 0.00    Average packs/day: 0.3 packs/day for 10.0 years (2.5 ttl pk-yrs)    Types: Cigarettes    Start date: 11/28/1985    Quit date: 11/29/1995    Years since quitting: 26.6    Passive exposure: Past   Smokeless tobacco: Former   Tobacco comments:    Quite 1997, didnt smoke hardly at all when I was smoking.  Vaping Use   Vaping status: Never Used  Substance Use Topics   Alcohol use: No   Drug use: No        Review of Systems  Constitutional: Negative.   HENT: Negative.    Eyes: Negative.   Respiratory: Negative.    Cardiovascular: Negative.   Gastrointestinal:  Positive for abdominal pain, heartburn and nausea.  Genitourinary:  Positive for urgency.  Skin: Negative.   Neurological:  Positive for headaches.  Psychiatric/Behavioral:  Positive for depression.      Physical Exam Blood pressure (!) 143/83, pulse 80, temperature 98.5 F (36.9 C), temperature source Oral, height 5\' 2"  (1.575 m), weight 168 lb (76.2 kg), SpO2 94%. Last Weight  Most recent update: 07/26/2022 11:16  AM    Weight  76.2 kg (168 lb)             CONSTITUTIONAL: Well developed, and nourished, appropriately responsive and aware without distress.   EYES: Sclera non-icteric.   EARS, NOSE, MOUTH AND THROAT:  The oropharynx is clear. Oral mucosa is pink and moist.    Hearing is intact to voice.  NECK: Trachea is midline, and there is no jugular venous distension.  LYMPH NODES:  Lymph nodes in the neck are not appreciated. RESPIRATORY:  Lungs are clear, and breath sounds are equal bilaterally.  Normal respiratory effort without pathologic use of accessory muscles. CARDIOVASCULAR: Heart is regular in rate and rhythm.    Well perfused.  GI: The abdomen is soft, nontender, and nondistended. In the para-umbilical area on the left there's a vague sub-1 mass vetically orient long axis of an elliptical lesion, c/w  scar/seroma.  There were no other palpable masses.  I did not appreciate hepatosplenomegaly.  MUSCULOSKELETAL:  Symmetrical muscle tone appreciated in all four extremities.    SKIN: Skin turgor is normal. No pathologic skin lesions appreciated.  NEUROLOGIC:  Motor and sensation appear grossly normal.  Cranial nerves are grossly without defect. PSYCH:  Alert and oriented to person, place and time. Affect is appropriate for situation.  Data Reviewed I have personally reviewed what is currently available of the patient's imaging, recent labs and medical records.   Labs:     Latest Ref Rng & Units 05/01/2022    8:51 AM 03/07/2022    1:49 PM 12/27/2021    9:15 AM  CBC  WBC 3.8 - 10.8 Thousand/uL 6.3  6.2  6.4   Hemoglobin 11.7 - 15.5 g/dL 40.9  81.1  9.7   Hematocrit 35.0 - 45.0 % 45.0  39.5  29.7   Platelets 140 - 400 Thousand/uL 169  179  244       Latest Ref Rng & Units 05/01/2022    8:51 AM 03/07/2022    1:49 PM 12/27/2021    9:15 AM  CMP  Glucose 65 - 99 mg/dL 86  914  782   BUN 7 - 25 mg/dL 19  18  13    Creatinine 0.50 - 1.05 mg/dL 9.56  2.13  0.86   Sodium 135 - 146 mmol/L 142  138  142   Potassium 3.5 - 5.3 mmol/L 4.9  4.1  4.6   Chloride 98 - 110 mmol/L 103  103  107   CO2 20 - 32 mmol/L 32  26  28   Calcium 8.6 - 10.4 mg/dL 9.7  8.8  8.7   Total Protein 6.1 - 8.1 g/dL 7.2     Total Bilirubin 0.2 - 1.2 mg/dL 0.7     AST 10 - 35 U/L 22     ALT 6 - 29 U/L 13      Imaging: Radiological images reviewed:  CLINICAL DATA:  Lump.  Previous surgeries.  Evaluate for hernia.   EXAM: ULTRASOUND ABDOMEN LIMITED   COMPARISON:  None Available.   FINDINGS: There is a hypoechoic elliptical mass in the region of the patient's symptoms. There is an anechoic component within the  otherwise hypoechoic mass. The mass measures 6.8 x 1.6 by 6.8 cm. No internal blood flow. Increased through transmission is identified.   IMPRESSION: 1. No hernia identified. 2. The patient is palpating a largely hypoechoic mass. Given history of previous surgeries, this could represent a complicated seroma. A complex solid and cystic mass is possible.  Electronically Signed   By: Gerome Sam III M.D.   On: 06/07/2022 10:25 Within last 24 hrs: No results found.  Assessment    Post surgical scarring/seroma, now more appreciated secondary to wgt loss.  Patient Active Problem List   Diagnosis Date Noted   Postmenopausal atrophic vaginitis 07/02/2022   Adjustment disorder with mixed anxiety and depressed mood 01/15/2022   S/P lumbar fusion 12/18/2021   Thyroid nodule 12/04/2021   Lumbar post-laminectomy syndrome 05/25/2021   Spondylosis without myelopathy 05/25/2021   Anxiety due to invasive procedure 03/22/2021   Weight gain 09/08/2020   Bipolar 1 disorder, mixed, mild (HCC) 07/18/2020   Esophageal dysphagia    Screen for colon cancer    Polyp of sigmoid colon    PAD (peripheral artery disease) (HCC) 04/27/2020   Bipolar disorder, in full remission, most recent episode mixed (HCC) 01/18/2020   Insomnia 10/27/2019   Localized osteoporosis without current pathological fracture 08/24/2019   Obesity (BMI 30.0-34.9) 06/18/2019   Left hand pain 05/18/2019   Abrasion 05/18/2019   Bipolar disorder, in full remission, most recent episode depressed (HCC) 03/16/2019   Chronic migraine without aura with status migrainosus, not intractable 12/16/2018   Acquired iron deficiency anemia due to decreased absorption 08/22/2018   Pes cavus of left foot 08/18/2018   Peripheral edema 07/22/2018   Chalazion left upper eyelid 07/22/2018   Panic disorder 06/26/2018   Atherosclerosis of native coronary artery of native heart with stable angina pectoris (HCC) 10/01/2017   Shortness of  breath 10/01/2017   Preop cardiovascular exam 10/01/2017   Cervico-occipital neuralgia (Left) 08/05/2017   Cervicogenic headache (Left) 08/05/2017   Cervical facet syndrome (Left) 08/05/2017   Spondylosis without myelopathy or radiculopathy, cervical region 08/05/2017   Cervicalgia (Left) 08/05/2017   Epidural fibrosis 07/23/2017   Abnormal intestinal absorption 07/06/2017   S/P gastric bypass 07/06/2017   Other specified dorsopathies, sacral and sacrococcygeal region 06/17/2017   Neurogenic pain 06/17/2017   Family history of coronary artery disease 05/20/2017   Carotid bruit 05/20/2017   Mixed hyperlipidemia 05/20/2017   Hollenhorst plaque, right eye 05/14/2017   GERD (gastroesophageal reflux disease) 04/17/2017   Coccygodynia 04/17/2017   PTSD (post-traumatic stress disorder) 04/04/2017   Lumbar spinal stenosis (severe) (L3-4) 03/26/2017   Lumbar lateral recess stenosis (Bilateral) (L3-4) 03/26/2017   Lumbar foraminal stenosis (L3-4) (Left) 03/26/2017   Hepatic steatosis 03/21/2017   History of nephrolithiasis 03/21/2017   History of hepatitis C 03/15/2017   Lumbar facet osteoarthritis (Bilateral) 10/25/2016   DDD (degenerative disc disease), lumbar 10/25/2016   Lumbar Facet Syndrome (Bilateral) (R>L) 10/15/2016   Grade 1 Anterolisthesis of L3 over L4 09/28/2016   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   Chronic shoulder pain (Left) 09/28/2016   History of cervical fusion (ACDF C4-C7) 09/28/2016   DDD (degenerative disc disease), cervical 09/28/2016   Chronic sacroiliac joint pain (Bilateral) (R>L) 09/28/2016   Osteoarthritis of hip (Bilateral) 09/18/2016   Chronic Low back pain (Primary Area of Pain) (Bilateral)  (R>L) 09/18/2016   Chronic lower extremity pain (Tertiary Area of Pain) (Bilateral) (L>R) 09/18/2016   High risk medication use 09/18/2016   Chronic knee pain (Left) 08/20/2016   Chronic pain syndrome 08/14/2016   Chronic neck pain  08/14/2016   Chronic hip  pain (Secondary Area of Pain) (Bilateral) (L>R) 08/14/2016   Opiate use 08/14/2016    Plan    Reassurances given, options of aspiration/biopsy entertained, but this is consistent within the realm of post-op scarring and residual seroma from  tummy tuck, even years ago.  Advised continued observation.   Face-to-face time spent with the patient and accompanying care providers(if present) was 30 minutes, with more than 50% of the time spent counseling, educating, and coordinating care of the patient.    These notes generated with voice recognition software. I apologize for typographical errors.  Campbell Lerner M.D., FACS 07/27/2022, 5:32 PM

## 2022-07-26 ENCOUNTER — Ambulatory Visit: Payer: Medicare HMO | Admitting: Surgery

## 2022-07-26 VITALS — BP 143/83 | HR 80 | Temp 98.5°F | Ht 62.0 in | Wt 168.0 lb

## 2022-07-26 DIAGNOSIS — S301XXA Contusion of abdominal wall, initial encounter: Secondary | ICD-10-CM

## 2022-07-26 DIAGNOSIS — K91872 Postprocedural seroma of a digestive system organ or structure following a digestive system procedure: Secondary | ICD-10-CM

## 2022-07-26 NOTE — Patient Instructions (Signed)
If you have any concerns or questions, please feel free to call our office.   Seroma A seroma is a collection of fluid on the body that looks like swelling or a mass. Seromas form where tissue has been injured or cut. Seromas vary in size. Some are small and painless. Others may grow large and cause pain or discomfort. Many seromas go away on their own as the fluid is naturally absorbed by the body, and some seromas need to be drained. What are the causes? Seromas form because tissue has been damaged or removed. This tissue damage may happen during surgery or because of an injury or trauma. When tissue is damaged or removed, empty space is created. The body's defense system (immune system) causes fluid to enter the empty space and form a seroma. What are the signs or symptoms? Symptoms of this condition include: Swelling at the site of a surgical incision or an injury. Drainage of clear fluid at the surgery or injury site. Discomfort or pain. How is this diagnosed? This condition is diagnosed based on: Your symptoms. Your medical history. A physical exam. During the exam, your health care provider will press on the seroma. You may also have tests, such as: Blood tests. An ultrasound, a CT scan, or other imaging tests. How is this treated? Some seromas go away on their own. Your health care provider may watch the seroma to make sure it does not cause any problems. Seromas that do not go away on their own may be treated. Treatment may include: Using a needle to drain the fluid from the seroma (needle aspiration). Putting in a small, thin tube (catheter) to drain the fluid. Putting on a bandage (dressing), such as an elastic bandage or binder. Taking antibiotics, if the seroma gets infected. In rare cases, surgery may be done to remove the seroma and repair the area. Follow these instructions at home:  If you were prescribed antibiotics, take them as told by your health care provider. Do not  stop using the antibiotic even if you start to feel better. Take over-the-counter and prescription medicines only as told by your health care provider. Return to your normal activities as told by your health care provider. Ask your health care provider what activities are safe for you. Check your seroma every day for signs of infection. Check for: Redness or pain. More swelling. More fluid. Warmth. Pus or a bad smell. Keep all follow-up visits. Your health care provider needs to check that your seroma is healing. Contact a health care provider if: You have a fever. You have redness or pain at the site of your seroma. Your seroma is more swollen or is getting bigger. You have more fluid coming from your seroma. Your seroma feels warm to the touch. You have pus or a bad smell coming from your seroma. Get help right away if: You have a fever along with severe pain, redness at the site, or chills. You feel confused or have trouble staying awake. You feel like your heart is beating very fast. You feel short of breath or are breathing fast. You have cool, clammy, or sweaty skin. Summary Seromas can form because of injury or surgery on tissue. Seromas can cause swelling, drainage of clear fluid, and discomfort or pain at the surgery or injury site. Some seromas go away on their own. Other seromas may need to be drained. Check your seroma every day for signs of infection. Signs include redness, pain, more swelling, more fluid, warmth,  pus, or a bad smell. This information is not intended to replace advice given to you by your health care provider. Make sure you discuss any questions you have with your health care provider. Document Revised: 03/13/2021 Document Reviewed: 03/13/2021 Elsevier Patient Education  2024 [employer].

## 2022-07-30 DIAGNOSIS — M8589 Other specified disorders of bone density and structure, multiple sites: Secondary | ICD-10-CM | POA: Diagnosis not present

## 2022-07-31 ENCOUNTER — Ambulatory Visit (INDEPENDENT_AMBULATORY_CARE_PROVIDER_SITE_OTHER): Payer: Medicare HMO

## 2022-07-31 ENCOUNTER — Encounter: Payer: Self-pay | Admitting: Podiatry

## 2022-07-31 ENCOUNTER — Ambulatory Visit: Payer: Medicare HMO | Admitting: Podiatry

## 2022-07-31 VITALS — BP 120/71 | HR 74

## 2022-07-31 DIAGNOSIS — M722 Plantar fascial fibromatosis: Secondary | ICD-10-CM | POA: Diagnosis not present

## 2022-07-31 MED ORDER — BETAMETHASONE SOD PHOS & ACET 6 (3-3) MG/ML IJ SUSP
3.0000 mg | Freq: Once | INTRAMUSCULAR | Status: AC
Start: 2022-07-31 — End: 2022-07-31
  Administered 2022-07-31: 3 mg via INTRA_ARTICULAR

## 2022-07-31 NOTE — Progress Notes (Signed)
   Chief Complaint  Patient presents with   Foot Pain    "They're really painful, especially my left one." N - heel pain L - bilateral D - 2 mos O - gradually worse C - sharp pain, tender, sore, feels like a stone bruise A - weight on it, walking, standing, when I get up in morning, barefoot T - taped, pads for pain, cushions in my shoes    HPI: 67 y.o. female presenting today as a reestablish new patient for evaluation of pain and tenderness to the bilateral heels ongoing for several years.  Left is worse than the right.  She was last seen in the office on 09/02/2018 for the same heel pain.  She wears good supportive shoes and OTC arch supports and does not go barefoot around the house.  She is on pain management taking etodolac.  Past Medical History:  Diagnosis Date   Allergy    Anemia    Anxiety    Arthritis    Yes   Back pain    Blood transfusion without reported diagnosis 1977   Transfusions from miscarriage & hemorrhaging   Chronic kidney disease    Depression    Disp fx of cuboid bone of right foot, init for clos fx 01/01/2017   Fibromyalgia    GERD (gastroesophageal reflux disease)    Headache    Hepatitis C    Hyperlipidemia    Lumbar radiculopathy    Neck pain    Neuromuscular disorder (HCC) 1997   Falling to much was an eye-opener   Osteoporosis    Thyroid disease    Urine incontinence       Physical Exam: General: The patient is alert and oriented x3 in no acute distress.  Dermatology: Skin is warm, dry and supple bilateral lower extremities. Negative for open lesions or macerations.  Vascular: Palpable pedal pulses bilaterally. No edema or erythema noted. Capillary refill within normal limits.  Neurological: Epicritic and protective threshold grossly intact bilaterally.   Musculoskeletal Exam: Pain with palpation noted to the plantar fascia bilateral.  ROM WNL.  Muscle strength 5/5 all compartments.  Radiographic Exam B/L feet 07/31/2022:  Normal  osseous mineralization. Joint spaces preserved. No fracture/dislocation/boney destruction.  Mild calcaneal spurring noted  Assessment: 1.  Chronic plantar fasciitis bilateral. LT > RT   Plan of Care:  -Patient was evaluated. X-Rays reviewed.  -Injection of 0.5 cc Celestone Soluspan injected in the bilateral plantar fascia  -Patient currently taking etodolac from pain management.  -Continue good supportive tennis shoes and sneakers.  She does not go barefoot around the house.  She also wears OTC insoles -Recommend daily stretching exercises to stretch the posterior aspect of the leg and calf -Return to clinic 1 month.  If there is no improvement we may need to consider surgical consult since the patient has had the heel pain now for several years despite conservative treatment  *Has [place] farm.  [place] acres.  Felecia Shelling, DPM [facility]  Dr. Felecia Shelling, DPM    [address] [place], [place] [address]                Office [phone]  Fax [phone]

## 2022-08-14 ENCOUNTER — Other Ambulatory Visit: Payer: Self-pay | Admitting: Obstetrics and Gynecology

## 2022-08-14 DIAGNOSIS — N952 Postmenopausal atrophic vaginitis: Secondary | ICD-10-CM

## 2022-08-15 ENCOUNTER — Other Ambulatory Visit: Payer: Self-pay | Admitting: Family Medicine

## 2022-08-15 DIAGNOSIS — B379 Candidiasis, unspecified: Secondary | ICD-10-CM

## 2022-08-16 NOTE — Telephone Encounter (Signed)
Requested medications are due for refill today.  unsure  Requested medications are on the active medications list.  no  Last refill. unsure  Future visit scheduled.   yes  Notes to clinic.  Please review for refill.    Requested Prescriptions  Pending Prescriptions Disp Refills   fluconazole (DIFLUCAN) 150 MG tablet [Pharmacy Med Name: FLUCONAZOLE 150 MG TABLET] 2 tablet 0    Sig: Take one tablet by mouth on Day 1. Repeat dose 2nd tablet on Day 3.     Off-Protocol Failed - 08/15/2022 10:06 AM      Failed - Medication not assigned to a protocol, review manually.      Passed - Valid encounter within last 12 months    Recent Outpatient Visits           2 months ago Ventral hernia without obstruction or gangrene   [place] [facility] [place], Netta Neat, DO   3 months ago Annual physical exam   [place] [facility] [place], Netta Neat, DO   5 months ago Eustachian tube dysfunction, bilateral   [place] [facility] [place], Netta Neat, DO   7 months ago Spinal stenosis of lumbar region without neurogenic claudication   [place] [facility] Smitty Cords, DO   9 months ago Hordeolum internum of right upper eyelid   [place] [facility] [place], Netta Neat, DO       Future Appointments             In 2 months Althea Charon, Netta Neat, DO  [facility] [place], [facility]

## 2022-08-30 ENCOUNTER — Other Ambulatory Visit: Payer: Self-pay | Admitting: Family Medicine

## 2022-08-30 DIAGNOSIS — E538 Deficiency of other specified B group vitamins: Secondary | ICD-10-CM

## 2022-08-30 NOTE — Telephone Encounter (Signed)
Requested Prescriptions  Pending Prescriptions Disp Refills   cyanocobalamin (VITAMIN B12) 1000 MCG/ML injection [Pharmacy Med Name: CYANOCOBALAMIN 1,000 MCG/ML VL] 3 mL 3    Sig: INJECT 1 ML (1,000 MCG TOTAL) INTO THE MUSCLE EVERY 30 DAYS.     Endocrinology:  Vitamins - Vitamin B12 Passed - 08/30/2022  7:58 AM      Passed - HCT in normal range and within 360 days    HCT  Date Value Ref Range Status  05/01/2022 45.0 35.0 - 45.0 % Final         Passed - HGB in normal range and within 360 days    Hemoglobin  Date Value Ref Range Status  05/01/2022 14.4 11.7 - 15.5 g/dL Final  09/81/1914 78.2 12.0 - 15.0 g/dL Final         Passed - B12 Level in normal range and within 360 days    Vitamin B-12  Date Value Ref Range Status  05/01/2022 463 200 - 1,100 pg/mL Final         Passed - Valid encounter within last 12 months    Recent Outpatient Visits           3 months ago Ventral hernia without obstruction or gangrene   [place] [facility] [place], Netta Neat, DO   4 months ago Annual physical exam   [place] [facility] [place], Netta Neat, DO   5 months ago Eustachian tube dysfunction, bilateral   [place] [facility] [place], Netta Neat, DO   8 months ago Spinal stenosis of lumbar region without neurogenic claudication   [place] [facility] [place], Netta Neat, DO   9 months ago Hordeolum internum of right upper eyelid    [facility] [place], Netta Neat, DO       Future Appointments             In 2 months Althea Charon, Netta Neat, DO  [facility], [facility]

## 2022-08-31 ENCOUNTER — Ambulatory Visit: Payer: Medicare HMO | Admitting: Podiatry

## 2022-09-05 DIAGNOSIS — M48061 Spinal stenosis, lumbar region without neurogenic claudication: Secondary | ICD-10-CM | POA: Diagnosis not present

## 2022-09-05 DIAGNOSIS — M545 Low back pain, unspecified: Secondary | ICD-10-CM | POA: Diagnosis not present

## 2022-09-05 DIAGNOSIS — Z79899 Other long term (current) drug therapy: Secondary | ICD-10-CM | POA: Diagnosis not present

## 2022-09-05 DIAGNOSIS — M5416 Radiculopathy, lumbar region: Secondary | ICD-10-CM | POA: Diagnosis not present

## 2022-09-05 DIAGNOSIS — M47896 Other spondylosis, lumbar region: Secondary | ICD-10-CM | POA: Diagnosis not present

## 2022-09-05 DIAGNOSIS — M533 Sacrococcygeal disorders, not elsewhere classified: Secondary | ICD-10-CM | POA: Diagnosis not present

## 2022-09-05 DIAGNOSIS — M479 Spondylosis, unspecified: Secondary | ICD-10-CM | POA: Diagnosis not present

## 2022-09-05 DIAGNOSIS — M25552 Pain in left hip: Secondary | ICD-10-CM | POA: Diagnosis not present

## 2022-09-05 DIAGNOSIS — G894 Chronic pain syndrome: Secondary | ICD-10-CM | POA: Diagnosis not present

## 2022-09-06 ENCOUNTER — Other Ambulatory Visit: Payer: Self-pay | Admitting: Medical

## 2022-09-06 NOTE — Telephone Encounter (Signed)
Rx request sent to pharmacy.  

## 2022-09-07 ENCOUNTER — Other Ambulatory Visit: Payer: Self-pay | Admitting: Family Medicine

## 2022-09-07 ENCOUNTER — Ambulatory Visit
Admission: RE | Admit: 2022-09-07 | Discharge: 2022-09-07 | Disposition: A | Discharge status: Home / Self Care | Payer: Medicare HMO | Source: Ambulatory Visit | Attending: Family Medicine | Admitting: Family Medicine

## 2022-09-07 DIAGNOSIS — Z1231 Encounter for screening mammogram for malignant neoplasm of breast: Secondary | ICD-10-CM

## 2022-09-08 ENCOUNTER — Encounter: Payer: Self-pay | Admitting: Podiatry

## 2022-09-09 ENCOUNTER — Encounter: Payer: Self-pay | Admitting: Internal Medicine

## 2022-09-10 ENCOUNTER — Encounter: Payer: Self-pay | Admitting: Internal Medicine

## 2022-09-13 ENCOUNTER — Other Ambulatory Visit: Payer: Self-pay | Admitting: Psychiatry

## 2022-09-13 ENCOUNTER — Other Ambulatory Visit: Payer: Self-pay | Admitting: Family Medicine

## 2022-09-13 ENCOUNTER — Encounter: Payer: Self-pay | Admitting: Psychiatry

## 2022-09-13 ENCOUNTER — Ambulatory Visit (INDEPENDENT_AMBULATORY_CARE_PROVIDER_SITE_OTHER): Payer: Medicare HMO | Admitting: Psychiatry

## 2022-09-13 VITALS — BP 145/82 | HR 77 | Temp 97.2°F | Ht 62.0 in | Wt 166.8 lb

## 2022-09-13 DIAGNOSIS — F3162 Bipolar disorder, current episode mixed, moderate: Secondary | ICD-10-CM

## 2022-09-13 DIAGNOSIS — F3178 Bipolar disorder, in full remission, most recent episode mixed: Secondary | ICD-10-CM | POA: Diagnosis not present

## 2022-09-13 DIAGNOSIS — F410 Panic disorder [episodic paroxysmal anxiety] without agoraphobia: Secondary | ICD-10-CM

## 2022-09-13 DIAGNOSIS — G4701 Insomnia due to medical condition: Secondary | ICD-10-CM

## 2022-09-13 DIAGNOSIS — F4310 Post-traumatic stress disorder, unspecified: Secondary | ICD-10-CM

## 2022-09-13 DIAGNOSIS — G8929 Other chronic pain: Secondary | ICD-10-CM

## 2022-09-13 MED ORDER — OXCARBAZEPINE 300 MG PO TABS
300.0000 mg | ORAL_TABLET | Freq: Two times a day (BID) | ORAL | 0 refills | Status: DC
Start: 2022-09-13 — End: 2022-12-10

## 2022-09-13 MED ORDER — VENLAFAXINE HCL ER 37.5 MG PO CP24
37.5000 mg | ORAL_CAPSULE | Freq: Every day | ORAL | 1 refills | Status: DC
Start: 2022-09-13 — End: 2023-03-10

## 2022-09-13 MED ORDER — HYDROXYZINE PAMOATE 50 MG PO CAPS
50.0000 mg | ORAL_CAPSULE | Freq: Two times a day (BID) | ORAL | 1 refills | Status: AC | PRN
Start: 2022-09-13 — End: ?

## 2022-09-13 MED ORDER — VENLAFAXINE HCL ER 75 MG PO CP24
75.0000 mg | ORAL_CAPSULE | Freq: Every day | ORAL | 1 refills | Status: DC
Start: 2022-09-13 — End: 2023-04-05

## 2022-09-13 MED ORDER — TRAZODONE HCL 100 MG PO TABS: 100.0000 mg | ORAL_TABLET | Freq: Every evening | ORAL | 0 refills | Status: DC | PRN

## 2022-09-13 NOTE — Progress Notes (Signed)
[facility] MD OP Progress Note  09/13/2022 4:38 PM Ashley Fields  MRN:  [id number]  Chief Complaint:  Chief Complaint  Patient presents with   Follow-up   Anxiety   Depression   Medication Refill   HPI: Ashley Fields is a 67 year old Caucasian female, married, on disability, lives in [place], has a history of bipolar disorder, PTSD, chronic pain, migraine headaches, history of laparoscopic gastric bypass was evaluated in office today.  Patient today reports she is currently struggling with irritability, anxiety and panic attacks.  She reports situational stressors of her spouse going through health issues.  They hence has been struggling with relationship struggles since her spouse also has been having some irritability.  Patient reports she is not interested in referral for therapy since she has her sister and she is able to talk to her sister and that helps.  She will let writer know if she changes her mind.  Patient reports currently sleep is overall good.  The trazodone does help.  Patient continues to be under the care of pain management for management of pain.  She reports she has lost several pounds in the past few months however it is intentional.  She reports she is currently on weight loss medication Zepbound.  Patient denies any current suicidality, homicidality or perceptual disturbances.  Patient denies any mania or hypomanic symptoms at this time.  Patient is compliant on Trileptal, venlafaxine, trazodone-denies side effects.    Visit Diagnosis:    ICD-10-CM   1. Bipolar disorder, in full remission, most recent episode mixed (HCC)  F31.78 venlafaxine XR (EFFEXOR XR) 37.5 MG 24 hr capsule    2. PTSD (post-traumatic stress disorder)  F43.10 venlafaxine XR (EFFEXOR-XR) 75 MG 24 hr capsule    hydrOXYzine (VISTARIL) 50 MG capsule    3. Panic disorder  F41.0 Oxcarbazepine (TRILEPTAL) 300 MG tablet    4. Insomnia due to medical condition  G47.01 traZODone (DESYREL) 100 MG tablet    pain, lack of sleep hygiene      Past Psychiatric History: I have reviewed past psychiatric history from progress note on 05/15/2018.  Past trials of Prozac, Wellbutrin, Lexapro, Cymbalta, Rexulti, Ambien-nightmares, Lunesta, Lamictal, risperidone.  Past Medical History:  Past Medical History:  Diagnosis Date   Allergy    Anemia    Anxiety    Arthritis    Yes   Back pain    Blood transfusion without reported diagnosis 1977   Transfusions from miscarriage & hemorrhaging   Chronic kidney disease    Depression    Disp fx of cuboid bone of right foot, init for clos fx 01/01/2017   Fibromyalgia    GERD (gastroesophageal reflux disease)    Headache    Hepatitis C    Hyperlipidemia    Lumbar radiculopathy    Neck pain    Neuromuscular disorder (HCC) 1997   Falling to much was an eye-opener   Osteoporosis    Thyroid disease    Urine incontinence     Past Surgical History:  Procedure Laterality Date   5 miscarriages     1977-1985, Blood transfusion s/p miscarriage 1977   ABDOMINAL EXPOSURE N/A 12/18/2021   Procedure: ABDOMINAL EXPOSURE;  Surgeon: Cephus Shelling, MD;  Location: [facility] OR;  Service: Vascular;  Laterality: N/A;   ABDOMINAL HYSTERECTOMY  1987   Total   ANTERIOR LUMBAR FUSION N/A 12/18/2021   Procedure: Anterior Lumbar Interbody Fusion  - Lumbar Five-Sacral One;  Surgeon: Tia Alert, MD;  Location: [facility] OR;  Service: Neurosurgery;  Laterality: N/A;   APPENDECTOMY  12/2008   AUGMENTATION MAMMAPLASTY Bilateral 2015   Bilat   AUGMENTATION MAMMAPLASTY Bilateral 2018   implants redone w/ placement of implants under muscle   breast lift bilateral, implants  05/18/2015   bilateral, silicon naturel   BREAST REDUCTION SURGERY Bilateral 1997   CESAREAN SECTION  07/27/1981   Placenta Previa   CHOLECYSTECTOMY  03/2004   Lap surgery   COLONOSCOPY WITH PROPOFOL N/A 06/13/2020   Procedure: COLONOSCOPY WITH PROPOFOL;  Surgeon: Pasty Spillers, MD;  Location: [facility]  ENDOSCOPY;  Service: Endoscopy;  Laterality: N/A;   COSMETIC SURGERY     Breast implants w/ lift, tummy tuck, upper & lower Blepharop   ESOPHAGOGASTRODUODENOSCOPY (EGD) WITH PROPOFOL N/A 06/13/2020   Procedure: ESOPHAGOGASTRODUODENOSCOPY (EGD) WITH PROPOFOL;  Surgeon: Pasty Spillers, MD;  Location: [facility] ENDOSCOPY;  Service: Endoscopy;  Laterality: N/A;   EYE SURGERY  2017   Cataract surgery & lasik   GASTRIC BYPASS  2005   Laparoscopic   IVC FILTER INSERTION  12/2003   TrapEase Vena Cava Filter   LUMBAR LAMINECTOMY  06/2006   L4-L5 (spinal fusion)   mini tummy tuck  05/07/2016   Bilateral bra/back roll lift skin removal   neck fusion     c3-c7 cadavier bones and metal plates placed per patient   neck surgery C3-C7  02/10/2015   ACDF   PROSTATE SURGERY     REDUCTION MAMMAPLASTY Bilateral 1997   SHOULDER ACROMIOPLASTY Left 03/27/2013   w/ labral debridement   SMALL INTESTINE SURGERY  12/24/2003   Lap Gastric Bypass   SPINAL CORD STIMULATOR IMPLANT  11/2010   removed 05/2011   SPINE SURGERY  2008 2017   Fusion L4-L5, ACDF C4-C7    Family Psychiatric History: I have reviewed family psychiatric history from progress note on 05/15/2018.  Family History:  Family History  Problem Relation Age of Onset   COPD Mother    Lung cancer Mother 15   Diabetes Mother    Heart disease Mother    Stroke Mother    Heart attack Mother    Arthritis Mother    Hypertension Mother    Obesity Mother    Varicose Veins Mother    Heart disease Father    Heart attack Father 60   Early death Father    Depression Sister        x 5 sisters   COPD Sister    Hypertension Sister    Colon polyps Sister    Hearing loss Sister    Heart attack Sister    Arthritis Sister    Varicose Veins Sister    COPD Sister    Obesity Sister    Depression Sister    Depression Sister    Heart disease Sister    Hypertension Sister    Hypertension Sister    Obesity Sister    Heart disease Brother        x  3 brothers   Diabetes Brother    Heart attack Brother    Arthritis Brother    Heart disease Brother    Hypertension Brother    Diabetes Brother    Heart disease Brother    Hypertension Brother    Obesity Brother    Varicose Veins Brother    Heart disease Brother    Hypertension Brother    Obesity Brother    Miscarriages / Stillbirths Maternal Aunt    Miscarriages / Stillbirths Paternal Aunt    Breast  cancer Paternal Aunt        early years   Cancer Maternal Grandfather    Stroke Maternal Grandfather     Social History: I have reviewed social history from progress note on 05/15/2018. Social History   Socioeconomic History   Marital status: Married    Spouse name: Not on file   Number of children: 1   Years of education: High School   Highest education level: Some college, no degree  Occupational History   Occupation: disability  Tobacco Use   Smoking status: Former    Current packs/day: 0.00    Average packs/day: 0.3 packs/day for 10.0 years (2.5 ttl pk-yrs)    Types: Cigarettes    Start date: 11/28/1985    Quit date: 11/29/1995    Years since quitting: 26.8    Passive exposure: Past   Smokeless tobacco: Former   Tobacco comments:    Quite 1997, didnt smoke hardly at all when I was smoking.  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]   Social Determinants of Health   Financial Resource Strain: Low Risk  (07/16/2022)   Overall Financial Resource Strain (CARDIA)    Difficulty of Paying Living Expenses: Not hard at all  Food Insecurity: No Food Insecurity (07/16/2022)   Hunger Vital Sign    Worried About  Running Out of Food in the Last Year: Never true    Ran Out of Food in the Last Year: Never true  Transportation Needs: No Transportation Needs (07/16/2022)   PRAPARE - Administrator, Civil Service (Medical): No    Lack of Transportation (Non-Medical): No  Physical Activity: Insufficiently Active (07/16/2022)   Exercise Vital Sign    Days of Exercise per Week: 3 days    Minutes of Exercise per Session: 30 min  Stress: Stress Concern Present (07/16/2022)   [employer] of Occupational Health - Occupational Stress Questionnaire    Feeling of Stress : Very much  Social Connections: Unknown (07/16/2022)   Social Connection and Isolation Panel [NHANES]    Frequency of Communication with Friends and Family: Once a week    Frequency of Social Gatherings with Friends and Family: Never    Attends Religious Services: Patient declined    Database administrator or Organizations: No    Attends Banker Meetings: Never    Marital Status: Married    Allergies:  Allergies  Allergen Reactions   Flagyl [Metronidazole] Anaphylaxis   Cymbalta [Duloxetine Hcl] Anxiety   Tape Rash    Paper tape okay to use per patient.    Metabolic Disorder Labs: Lab Results  Component Value Date   HGBA1C 5.4 05/01/2022   MPG 108 05/01/2022   MPG 91 04/28/2021   No results found for: "PROLACTIN" Lab Results  Component Value Date   CHOL 133 05/01/2022   TRIG 130 05/01/2022   HDL 69 05/01/2022   CHOLHDL 1.9 05/01/2022   LDLCALC 43 05/01/2022   LDLCALC 40 04/28/2021   Lab Results  Component Value Date   TSH 2.11 05/01/2022   TSH 3.29 04/28/2021    Therapeutic Level  Labs: No results found for: "LITHIUM" No results found for: "VALPROATE" No results found for: "CBMZ"  Current Medications: Current Outpatient Medications  Medication Sig Dispense Refill   aspirin EC (ASPIRIN LOW DOSE) 81 MG tablet TAKE 1 TABLET BY MOUTH EVERY DAY 90 tablet 0   azelastine (ASTELIN) 0.1 % nasal  spray Place 2 sprays into both nostrils 2 (two) times daily. Use in each nostril as directed 30 mL 11   bisacodyl (DULCOLAX) 5 MG EC tablet Take 5 mg by mouth daily as needed for moderate constipation.     butalbital-acetaminophen-caffeine (FIORICET) 50-325-40 MG tablet Take 1 tablet by mouth 2 (two) times daily as needed for headache.     Cholecalciferol 250 MCG (10000 UT) CAPS Take 10,000 Units by mouth once a week.     cyanocobalamin (VITAMIN B12) 1000 MCG/ML injection Inject 1 mL (1,000 mcg total) into the muscle every 30 (thirty) days. 1 mL 11   diclofenac Sodium (VOLTAREN) 1 % GEL Apply 2 g topically 4 (four) times daily as needed (pain).     estradiol (ESTRACE VAGINAL) 0.1 MG/GM vaginal cream Apply pea sized amount externally nightly for 1 week, then once weekly as maintenance 42.5 g 0   estradiol (ESTRACE) 1 MG tablet TAKE 1 TABLET EVERY DAY 90 tablet 1   etodolac (LODINE) 500 MG tablet Take 1 tablet by mouth 2 (two) times daily.     ezetimibe (ZETIA) 10 MG tablet Take 1 tablet (10 mg total) by mouth daily. 90 tablet 3   fluticasone (FLONASE) 50 MCG/ACT nasal spray SPRAY 2 SPRAYS INTO EACH NOSTRIL EVERY DAY 48 mL 0   furosemide (LASIX) 20 MG tablet Take 1 tablet (20 mg total) by mouth daily as needed for fluid or edema. 90 tablet 1   gabapentin (NEURONTIN) 600 MG tablet Take 1 tablet (600 mg total) by mouth in the morning, at noon, in the evening, and at bedtime. 360 tablet 3   hydrOXYzine (VISTARIL) 50 MG capsule Take 1 capsule (50 mg total) by mouth 2 (two) times daily as needed for anxiety. 60 capsule 1   omeprazole (PRILOSEC) 40 MG capsule Take 1 capsule (40 mg total) by mouth daily. 90 capsule 3   ondansetron (ZOFRAN ODT) 4 MG disintegrating tablet Take 1 tablet (4 mg total) by mouth every 8 (eight) hours as needed for nausea or vomiting. 30 tablet 2   oxyCODONE-acetaminophen (PERCOCET) 7.5-325 MG tablet Take 1 tablet by mouth every 6 (six) hours as needed for severe pain.      rizatriptan (MAXALT-MLT) 10 MG disintegrating tablet Take 1 tablet (10 mg total) by mouth as needed for migraine. May repeat in 2 hours if needed, that is max dose for 24 hours. 10 tablet 3   rosuvastatin (CRESTOR) 40 MG tablet TAKE 1 TABLET BY MOUTH EVERY DAY 90 tablet 1   tiZANidine (ZANAFLEX) 4 MG tablet TAKE 1 TABLET BY MOUTH EVERY 6 HOURS 120 tablet 1   valACYclovir (VALTREX) 1000 MG tablet TAKE 1 TABLET (1,000 MG TOTAL) BY MOUTH DAILY. FOR 5-7 DAYS, MAY REPEAT IF NEED FOR COLD SORE 90 tablet 1   venlafaxine XR (EFFEXOR XR) 37.5 MG 24 hr capsule Take 1 capsule (37.5 mg total) by mouth daily with breakfast. Take along with 75 mg daily 90 capsule 1   Zoledronic Acid (ZOMETA) 4 MG/100ML IVPB Once per year from Orthopedic 100 mL    Oxcarbazepine (TRILEPTAL) 300 MG tablet Take 1 tablet (300 mg total) by mouth 2 (two) times daily. 180 tablet  0   Tirzepatide-Weight Management (ZEPBOUND [place])      traZODone (DESYREL) 100 MG tablet Take 1-1.5 tablets (100-150 mg total) by mouth at bedtime as needed for sleep. 135 tablet 0   venlafaxine XR (EFFEXOR-XR) 75 MG 24 hr capsule Take 1 capsule (75 mg total) by mouth daily with breakfast. 90 capsule 1   No current facility-administered medications for this visit.     Musculoskeletal: Strength & Muscle Tone: within normal limits Gait & Station: normal Patient leans: N/A  Psychiatric Specialty Exam: Review of Systems  Psychiatric/Behavioral:  The patient is nervous/anxious.        Irritable    Blood pressure (!) 145/82, pulse 77, temperature (!) 97.2 F (36.2 C), temperature source Skin, height 5\' 2"  (1.575 m), weight 166 lb 12.8 oz (75.7 kg).Body mass index is 30.51 kg/m.  General Appearance: Casual  Eye Contact:  Fair  Speech:  Clear and Coherent  Volume:  Normal  Mood:  Anxious and Irritable  Affect:  Appropriate  Thought Process:  Goal Directed and Descriptions of Associations: Intact  Orientation:  Full (Time, Place, and Person)  Thought  Content: Logical   Suicidal Thoughts:  No  Homicidal Thoughts:  No  Memory:  Immediate;   Fair Recent;   Fair Remote;   Fair  Judgement:  Fair  Insight:  Fair  Psychomotor Activity:  Normal  Concentration:  Concentration: Fair and Attention Span: Fair  Recall:  [employer] of Knowledge: Fair  Language: Fair  Akathisia:  No  Handed:  Right  AIMS (if indicated): not done  Assets:  Communication Skills Desire for Improvement Housing Social Support  ADL's:  Intact  Cognition: WNL  Sleep:  Fair   Screenings: AIMS    Flowsheet Row Video Visit from 06/13/2021 in [facility] Video Visit from 05/16/2021 in [facility]  AIMS Total Score 0 0      GAD-7    Flowsheet Row Office Visit from 09/13/2022 in [facility] Office Visit from 05/01/2022 in [facility] Video Visit from 01/15/2022 in [facility] Office Visit from 12/27/2021 in [facility] Office Visit from 11/09/2021 in [facility]  Total GAD-7 Score 12 0 9 0 0      PHQ2-9    Flowsheet Row Office Visit from 09/13/2022 in [facility] Clinical Support from 07/20/2022 in [facility] Office Visit from 05/01/2022 in [facility] Video Visit from 01/15/2022 in [facility] Office Visit from 12/27/2021 in [facility]  PHQ-2 Total Score 2 0 0 2 1  PHQ-9 Total Score 4 0 -- 6 2      Flowsheet Row Office Visit from 09/13/2022 in [facility] Video Visit from 05/15/2022 in [facility] Video Visit from 04/03/2022 in [facility]  C-SSRS  RISK CATEGORY No Risk No Risk No Risk        Assessment and Plan: Shanike Cobleigh is a 67 year old Caucasian female who has a history of PTSD, bipolar disorder, panic disorder, migraine headaches was evaluated in office today.  Patient is currently struggling with worsening anxiety, irritability, does have situational stressors which also contributes to her current worsening of mood symptoms, discussed plan as noted  below.  Plan Bipolar disorder type I mixed moderate in full remission Trileptal 300 mg p.o. twice daily Gabapentin 600 mg p.o. 3 times daily-prescribed by primary care provider although it is also a mood stabilizer.  PTSD-stable Continue venlafaxine as prescribed   Panic disorder-unstable Increase venlafaxine extended release to 112.5 mg p.o. daily Discussed referral for psychotherapy-patient declines Increase hydroxyzine to 50 mg p.o. twice daily as needed  Insomnia-stable Trazodone 100-200 mg p.o. nightly   Follow-up in clinic in 4 to 6 weeks or sooner if needed. Collaboration of Care: Collaboration of Care: Referral or follow-up with counselor/therapist AEB patient encouraged to establish care with therapist, patient declines.  Patient/Guardian was advised Release of Information must be obtained prior to any record release in order to collaborate their care with an outside provider. Patient/Guardian was advised if they have not already done so to contact the registration department to sign all necessary forms in order for [place] to release information regarding their care.   Consent: Patient/Guardian gives verbal consent for treatment and assignment of benefits for services provided during this visit. Patient/Guardian expressed understanding and agreed to proceed.   This note was generated in part or whole with voice recognition software. Voice recognition is usually quite accurate but there are transcription errors that can and very often do occur. I apologize for any  typographical errors that were not detected and corrected.    Jomarie Longs, MD 09/13/2022, 4:38 PM

## 2022-09-16 ENCOUNTER — Other Ambulatory Visit: Payer: Self-pay | Admitting: Obstetrics and Gynecology

## 2022-09-16 DIAGNOSIS — N898 Other specified noninflammatory disorders of vagina: Secondary | ICD-10-CM

## 2022-09-17 ENCOUNTER — Encounter: Payer: Self-pay | Admitting: Internal Medicine

## 2022-09-17 ENCOUNTER — Inpatient Hospital Stay: Payer: Medicare HMO | Attending: Internal Medicine

## 2022-09-17 DIAGNOSIS — R1905 Periumbilic swelling, mass or lump: Secondary | ICD-10-CM | POA: Insufficient documentation

## 2022-09-17 DIAGNOSIS — E538 Deficiency of other specified B group vitamins: Secondary | ICD-10-CM | POA: Insufficient documentation

## 2022-09-17 DIAGNOSIS — R7989 Other specified abnormal findings of blood chemistry: Secondary | ICD-10-CM

## 2022-09-17 DIAGNOSIS — E559 Vitamin D deficiency, unspecified: Secondary | ICD-10-CM | POA: Diagnosis not present

## 2022-09-17 DIAGNOSIS — Z9884 Bariatric surgery status: Secondary | ICD-10-CM | POA: Diagnosis not present

## 2022-09-17 DIAGNOSIS — D508 Other iron deficiency anemias: Secondary | ICD-10-CM | POA: Diagnosis not present

## 2022-09-17 LAB — BASIC METABOLIC PANEL - CANCER CENTER ONLY
Anion gap: 7 (ref 5–15)
BUN: 17 mg/dL (ref 8–23)
CO2: 29 mmol/L (ref 22–32)
Calcium: 8.8 mg/dL — ABNORMAL LOW (ref 8.9–10.3)
Chloride: 103 mmol/L (ref 98–111)
Creatinine: 0.85 mg/dL (ref 0.44–1.00)
GFR, Estimated: >60 mL/min (ref >60)
Glucose, Bld: 91 mg/dL (ref 70–99)
Potassium: 4.5 mmol/L (ref 3.5–5.1)
Sodium: 139 mmol/L (ref 135–145)

## 2022-09-17 LAB — CBC WITH DIFFERENTIAL (CANCER CENTER ONLY)
Abs Immature Granulocytes: 0 10*3/uL (ref 0.00–0.07)
Basophils Absolute: 0 10*3/uL (ref 0.0–0.1)
Basophils Relative: 1 %
Eosinophils Absolute: 0.2 10*3/uL (ref 0.0–0.5)
Eosinophils Relative: 4 %
HCT: 42.7 % (ref 36.0–46.0)
Hemoglobin: 13.9 g/dL (ref 12.0–15.0)
Immature Granulocytes: 0 %
Lymphocytes Relative: 32 %
Lymphs Abs: 1.4 10*3/uL (ref 0.7–4.0)
MCH: 30.9 pg (ref 26.0–34.0)
MCHC: 32.6 g/dL (ref 30.0–36.0)
MCV: 94.9 fL (ref 80.0–100.0)
Monocytes Absolute: 0.3 10*3/uL (ref 0.1–1.0)
Monocytes Relative: 6 %
Neutro Abs: 2.6 10*3/uL (ref 1.7–7.7)
Neutrophils Relative %: 57 %
Platelet Count: 175 10*3/uL (ref 150–400)
RBC: 4.5 MIL/uL (ref 3.87–5.11)
RDW: 12.5 % (ref 11.5–15.5)
WBC Count: 4.5 10*3/uL (ref 4.0–10.5)
nRBC: 0 % (ref 0.0–0.2)

## 2022-09-17 LAB — FERRITIN: Ferritin: 35 ng/mL (ref 11–307)

## 2022-09-17 LAB — IRON AND TIBC
Iron: 105 ug/dL (ref 28–170)
Saturation Ratios: 28 % (ref 10.4–31.8)
TIBC: 374 ug/dL (ref 250–450)
UIBC: 269 ug/dL

## 2022-09-17 LAB — VITAMIN B12: Vitamin B-12: 557 pg/mL (ref 180–914)

## 2022-09-17 LAB — VITAMIN D 25 HYDROXY (VIT D DEFICIENCY, FRACTURES): Vit D, 25-Hydroxy: 78.35 ng/mL (ref 30–100)

## 2022-09-18 ENCOUNTER — Other Ambulatory Visit: Payer: Self-pay

## 2022-09-18 DIAGNOSIS — Z90710 Acquired absence of both cervix and uterus: Secondary | ICD-10-CM

## 2022-09-19 ENCOUNTER — Other Ambulatory Visit: Payer: Self-pay | Admitting: Family Medicine

## 2022-09-19 ENCOUNTER — Inpatient Hospital Stay (HOSPITAL_BASED_OUTPATIENT_CLINIC_OR_DEPARTMENT_OTHER): Payer: Medicaid Other | Admitting: Medical Oncology

## 2022-09-19 ENCOUNTER — Encounter: Payer: Self-pay | Admitting: Medical Oncology

## 2022-09-19 ENCOUNTER — Inpatient Hospital Stay: Payer: Medicaid Other

## 2022-09-19 VITALS — BP 140/86 | HR 86 | Temp 97.7°F | Wt 164.0 lb

## 2022-09-19 VITALS — BP 134/80 | HR 76

## 2022-09-19 DIAGNOSIS — R1900 Intra-abdominal and pelvic swelling, mass and lump, unspecified site: Secondary | ICD-10-CM

## 2022-09-19 DIAGNOSIS — E559 Vitamin D deficiency, unspecified: Secondary | ICD-10-CM | POA: Diagnosis not present

## 2022-09-19 DIAGNOSIS — K5900 Constipation, unspecified: Secondary | ICD-10-CM | POA: Diagnosis not present

## 2022-09-19 DIAGNOSIS — R1033 Periumbilical pain: Secondary | ICD-10-CM | POA: Diagnosis not present

## 2022-09-19 DIAGNOSIS — D508 Other iron deficiency anemias: Secondary | ICD-10-CM

## 2022-09-19 DIAGNOSIS — Z9884 Bariatric surgery status: Secondary | ICD-10-CM | POA: Diagnosis not present

## 2022-09-19 DIAGNOSIS — E538 Deficiency of other specified B group vitamins: Secondary | ICD-10-CM

## 2022-09-19 DIAGNOSIS — R1905 Periumbilic swelling, mass or lump: Secondary | ICD-10-CM

## 2022-09-19 MED ORDER — SODIUM CHLORIDE 0.9 % IV SOLN
200.0000 mg | Freq: Once | INTRAVENOUS | Status: AC
  Administered 2022-09-19: 200 mg via INTRAVENOUS
  Filled 2022-09-19: qty 200

## 2022-09-19 MED ORDER — SODIUM CHLORIDE 0.9 % IV SOLN
Freq: Once | INTRAVENOUS | Status: AC
  Filled 2022-09-19: qty 250

## 2022-09-19 NOTE — Progress Notes (Unsigned)
[facility] Office Visit Follow Up   Patient Care Team: Smitty Cords, DO as PCP - General (Family Medicine) Earna Coder, MD as Consulting Physician (Oncology)  CHIEF COMPLAINTS/PURPOSE OF CONSULTATION: Iron deficiency   HEMATOLOGY HISTORY  # ANEMIA IRON DEFICIENCY GASTRIC BYPASS [2005 [place]] EGD-none; colonoscopy-2015- few polyps snared [[place]]; IV venofer x3; fall 2020/ PO B12 [intol to SL b12]  Latest Reference Range & Units 03/03/21 10:15  Iron 28 - 170 ug/dL 478  UIBC ug/dL 295  TIBC 621 - 308 ug/dL 657 (H)  Saturation Ratios 10.4 - 31.8 % 24  Ferritin 11 - 307 ng/mL 15  Vitamin B12 180 - 914 pg/mL 573  (H): Data is abnormally high  HISTORY OF PRESENTING ILLNESS:  Ashley Fields 67 y.o.  female history of iron deficiency secondary to gastric bypass is here for follow-up.  She reports that her fatigue is about the same. Her main concern today is a mass in her abdomen that she has had for the past few weeks/months. The area feels stable in size to patient. This area occurred without injury and is mildly uncomfortable for patient. Since noticing this area she has also had new constipation. She also has had some mild unintentional weight loss. No night sweats. She denies any bloody bowel movements or vomiting. She is UTD on her colonoscopies but is unsure exactly when her last was. She has had a hysterectomy as well as multiple abdominal surgeries. She did see her GYN about  this concern who ordered a [place] with results shown below. She has not had any further work up since.   She continues to complete her monthly B12 injections at home. Continues to take vitamin D 5,000 IU weekly OTC. She denies any bleeding episodes.   "EXAM: ULTRASOUND ABDOMEN LIMITED   COMPARISON:  None Available.   FINDINGS: There is a hypoechoic elliptical mass in the region of the patient's symptoms. There is an anechoic component within the otherwise hypoechoic mass.  The mass measures 6.8 x 1.6 by 6.8 cm. No internal blood flow. Increased through transmission is identified.   IMPRESSION: 1. No hernia identified. 2. The patient is palpating a largely hypoechoic mass. Given history of previous surgeries, this could represent a complicated seroma. A complex solid and cystic mass is possible."  Review of Systems  Constitutional:  Positive for malaise/fatigue. Negative for chills, diaphoresis, fever and weight loss.  HENT:  Negative for nosebleeds and sore throat.   Eyes:  Negative for double vision.  Respiratory:  Negative for cough, hemoptysis, sputum production and wheezing.   Cardiovascular:  Negative for chest pain, palpitations, orthopnea and leg swelling.  Gastrointestinal:  Positive for abdominal pain. Negative for blood in stool, constipation, diarrhea, heartburn, melena, nausea and vomiting.  Genitourinary:  Negative for dysuria, frequency and urgency.  Musculoskeletal:  Negative for back pain and joint pain.  Skin: Negative.  Negative for itching and rash.  Neurological:  Negative for dizziness, tingling, focal weakness, weakness and headaches.  Endo/Heme/Allergies:  Does not bruise/bleed easily.  Psychiatric/Behavioral:  Negative for depression. The patient is not nervous/anxious and does not have insomnia.    Wt Readings from Last 3 Encounters:  09/19/22 164 lb (74.4 kg)  07/26/22 168 lb (76.2 kg)  07/20/22 163 lb 9.6 oz (74.2 kg)    MEDICAL HISTORY:  Past Medical History:  Diagnosis Date   Allergy    Anemia    Anxiety    Arthritis    Yes  Back pain    Blood transfusion without reported diagnosis 1977   Transfusions from miscarriage & hemorrhaging   Chronic kidney disease    Depression    Disp fx of cuboid bone of right foot, init for clos fx 01/01/2017   Fibromyalgia    GERD (gastroesophageal reflux disease)    Headache    Hepatitis C    Hyperlipidemia    Lumbar radiculopathy    Neck pain    Neuromuscular disorder (HCC)  1997   Falling to much was an eye-opener   Osteoporosis    Thyroid disease    Urine incontinence     SURGICAL HISTORY: Past Surgical History:  Procedure Laterality Date   5 miscarriages     1977-1985, Blood transfusion s/p miscarriage 1977   ABDOMINAL EXPOSURE N/A 12/18/2021   Procedure: ABDOMINAL EXPOSURE;  Surgeon: Cephus Shelling, MD;  Location: [facility] OR;  Service: Vascular;  Laterality: N/A;   ABDOMINAL HYSTERECTOMY  1987   Total   ANTERIOR LUMBAR FUSION N/A 12/18/2021   Procedure: Anterior Lumbar Interbody Fusion  - Lumbar Five-Sacral One;  Surgeon: Tia Alert, MD;  Location: [facility] OR;  Service: Neurosurgery;  Laterality: N/A;   APPENDECTOMY  12/2008   AUGMENTATION MAMMAPLASTY Bilateral 2015   Bilat   AUGMENTATION MAMMAPLASTY Bilateral 2018   implants redone w/ placement of implants under muscle   breast lift bilateral, implants  05/18/2015   bilateral, silicon naturel   BREAST REDUCTION SURGERY Bilateral 1997   CESAREAN SECTION  07/27/1981   Placenta Previa   CHOLECYSTECTOMY  03/2004   Lap surgery   COLONOSCOPY WITH PROPOFOL N/A 06/13/2020   Procedure: COLONOSCOPY WITH PROPOFOL;  Surgeon: Pasty Spillers, MD;  Location: [facility] ENDOSCOPY;  Service: Endoscopy;  Laterality: N/A;   COSMETIC SURGERY     Breast implants w/ lift, tummy tuck, upper & lower Blepharop   ESOPHAGOGASTRODUODENOSCOPY (EGD) WITH PROPOFOL N/A 06/13/2020   Procedure: ESOPHAGOGASTRODUODENOSCOPY (EGD) WITH PROPOFOL;  Surgeon: Pasty Spillers, MD;  Location: [facility] ENDOSCOPY;  Service: Endoscopy;  Laterality: N/A;   EYE SURGERY  2017   Cataract surgery & lasik   GASTRIC BYPASS  2005   Laparoscopic   IVC FILTER INSERTION  12/2003   TrapEase Vena Cava Filter   LUMBAR LAMINECTOMY  06/2006   L4-L5 (spinal fusion)   mini tummy tuck  05/07/2016   Bilateral bra/back roll lift skin removal   neck fusion     c3-c7 cadavier bones and metal plates placed per patient   neck surgery C3-C7  02/10/2015    ACDF   PROSTATE SURGERY     REDUCTION MAMMAPLASTY Bilateral 1997   SHOULDER ACROMIOPLASTY Left 03/27/2013   w/ labral debridement   SMALL INTESTINE SURGERY  12/24/2003   Lap Gastric Bypass   SPINAL CORD STIMULATOR IMPLANT  11/2010   removed 05/2011   SPINE SURGERY  2008 2017   Fusion L4-L5, ACDF C4-C7    SOCIAL HISTORY: Social History   Socioeconomic History   Marital status: Married    Spouse name: Not on file   Number of children: 1   Years of education: High School   Highest education level: Some college, no degree  Occupational History   Occupation: disability  Tobacco Use   Smoking status: Former    Current packs/day: 0.00    Average packs/day: 0.3 packs/day for 10.0 years (2.5 ttl pk-yrs)    Types: Cigarettes    Start date: 11/28/1985    Quit date: 11/29/1995    Years since  quitting: 26.8    Passive exposure: Past   Smokeless tobacco: Former   Tobacco comments:    Quite 1997, didnt smoke hardly at all when I was smoking.  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]   Social Determinants of Health   Financial Resource Strain: Low Risk  (07/16/2022)   Overall Financial Resource Strain (CARDIA)    Difficulty of Paying Living Expenses: Not hard at all  Food Insecurity: No Food Insecurity (07/16/2022)   Hunger Vital Sign    Worried About Running Out of Food in the Last Year: Never true    Ran Out of Food in the Last Year: Never true  Transportation Needs: No Transportation Needs (07/16/2022)   PRAPARE - Administrator, Civil Service (Medical): No    Lack of Transportation (Non-Medical): No  Physical  Activity: Insufficiently Active (07/16/2022)   Exercise Vital Sign    Days of Exercise per Week: 3 days    Minutes of Exercise per Session: 30 min  Stress: Stress Concern Present (07/16/2022)   [employer] of Occupational Health - Occupational Stress Questionnaire    Feeling of Stress : Very much  Social Connections: Unknown (07/16/2022)   Social Connection and Isolation Panel [NHANES]    Frequency of Communication with Friends and Family: Once a week    Frequency of Social Gatherings with Friends and Family: Never    Attends Religious Services: Patient declined    Database administrator or Organizations: No    Attends Banker Meetings: Never    Marital Status: Married  Catering manager Violence: Not At Risk (07/20/2022)   Humiliation, Afraid, Rape, and Kick questionnaire    Fear of Current or Ex-Partner: No    Emotionally Abused: No    Physically Abused: No    Sexually Abused: No    FAMILY HISTORY: Family History  Problem Relation Age of Onset   COPD Mother    Lung cancer Mother 40   Diabetes Mother    Heart disease Mother    Stroke Mother    Heart attack Mother    Arthritis Mother    Hypertension Mother    Obesity Mother    Varicose Veins Mother    Heart disease Father    Heart attack Father 58   Early death Father    Depression Sister        x 5 sisters   COPD Sister    Hypertension Sister    Colon polyps Sister    Hearing loss Sister    Heart attack Sister    Arthritis Sister    Varicose Veins Sister    COPD Sister    Obesity Sister    Depression Sister    Depression Sister    Heart disease Sister    Hypertension Sister    Hypertension Sister    Obesity Sister    Heart disease Brother        x 3 brothers   Diabetes Brother    Heart attack Brother    Arthritis Brother    Heart disease Brother    Hypertension Brother  Diabetes Brother    Heart disease Brother    Hypertension Brother    Obesity Brother    Varicose Veins Brother     Heart disease Brother    Hypertension Brother    Obesity Brother    Miscarriages / Stillbirths Maternal Aunt    Miscarriages / Stillbirths Paternal Aunt    Breast cancer Paternal Aunt        early years   Cancer Maternal Grandfather    Stroke Maternal Grandfather     ALLERGIES:  is allergic to flagyl [metronidazole], cymbalta [duloxetine hcl], and tape.  MEDICATIONS:  Current Outpatient Medications  Medication Sig Dispense Refill   aspirin EC (ASPIRIN LOW DOSE) 81 MG tablet TAKE 1 TABLET BY MOUTH EVERY DAY 90 tablet 0   azelastine (ASTELIN) 0.1 % nasal spray Place 2 sprays into both nostrils 2 (two) times daily. Use in each nostril as directed 30 mL 11   bisacodyl (DULCOLAX) 5 MG EC tablet Take 5 mg by mouth daily as needed for moderate constipation.     butalbital-acetaminophen-caffeine (FIORICET) 50-325-40 MG tablet Take 1 tablet by mouth 2 (two) times daily as needed for headache.     Cholecalciferol 250 MCG (10000 UT) CAPS Take 10,000 Units by mouth once a week.     diclofenac Sodium (VOLTAREN) 1 % GEL Apply 2 g topically 4 (four) times daily as needed (pain).     estradiol (ESTRACE VAGINAL) 0.1 MG/GM vaginal cream Apply pea sized amount externally nightly for 1 week, then once weekly as maintenance 42.5 g 0   estradiol (ESTRACE) 1 MG tablet TAKE 1 TABLET EVERY DAY 90 tablet 1   etodolac (LODINE) 500 MG tablet Take 1 tablet by mouth 2 (two) times daily.     ezetimibe (ZETIA) 10 MG tablet Take 1 tablet (10 mg total) by mouth daily. 90 tablet 3   fluconazole (DIFLUCAN) 150 MG tablet TAKE ONE TABLET BY MOUTH ON DAY 1. REPEAT DOSE 2ND TABLET ON DAY 3. 2 tablet 0   fluticasone (FLONASE) 50 MCG/ACT nasal spray SPRAY 2 SPRAYS INTO EACH NOSTRIL EVERY DAY 48 mL 0   furosemide (LASIX) 20 MG tablet Take 1 tablet (20 mg total) by mouth daily as needed for fluid or edema. 90 tablet 1   gabapentin (NEURONTIN) 600 MG tablet Take 1 tablet (600 mg total) by mouth in the morning, at noon, in the  evening, and at bedtime. 360 tablet 3   hydrOXYzine (VISTARIL) 50 MG capsule Take 1 capsule (50 mg total) by mouth 2 (two) times daily as needed for anxiety. 60 capsule 1   omeprazole (PRILOSEC) 40 MG capsule Take 1 capsule (40 mg total) by mouth daily. 90 capsule 3   ondansetron (ZOFRAN ODT) 4 MG disintegrating tablet Take 1 tablet (4 mg total) by mouth every 8 (eight) hours as needed for nausea or vomiting. 30 tablet 2   Oxcarbazepine (TRILEPTAL) 300 MG tablet Take 1 tablet (300 mg total) by mouth 2 (two) times daily. 180 tablet 0   oxyCODONE-acetaminophen (PERCOCET) 7.5-325 MG tablet Take 1 tablet by mouth every 6 (six) hours as needed for severe pain.     rizatriptan (MAXALT-MLT) 10 MG disintegrating tablet Take 1 tablet (10 mg total) by mouth as needed for migraine. May repeat in 2 hours if needed, that is max dose for 24 hours. 10 tablet 3   rosuvastatin (CRESTOR) 40 MG tablet TAKE 1 TABLET BY MOUTH EVERY DAY 90 tablet 1   Tirzepatide-Weight Management (ZEPBOUND [place])  tiZANidine (ZANAFLEX) 4 MG tablet TAKE 1 TABLET BY MOUTH EVERY 6 HOURS 120 tablet 1   traZODone (DESYREL) 100 MG tablet Take 1-1.5 tablets (100-150 mg total) by mouth at bedtime as needed for sleep. 135 tablet 0   valACYclovir (VALTREX) 1000 MG tablet TAKE 1 TABLET (1,000 MG TOTAL) BY MOUTH DAILY. FOR 5-7 DAYS, MAY REPEAT IF NEED FOR COLD SORE 90 tablet 1   venlafaxine XR (EFFEXOR XR) 37.5 MG 24 hr capsule Take 1 capsule (37.5 mg total) by mouth daily with breakfast. Take along with 75 mg daily 90 capsule 1   venlafaxine XR (EFFEXOR-XR) 75 MG 24 hr capsule Take 1 capsule (75 mg total) by mouth daily with breakfast. 90 capsule 1   Zoledronic Acid (ZOMETA) 4 MG/100ML IVPB Once per year from Orthopedic 100 mL    cyanocobalamin (VITAMIN B12) 1000 MCG/ML injection INJECT 1 ML (1,000 MCG TOTAL) INTO THE MUSCLE EVERY 30 DAYS. 3 mL 3   No current facility-administered medications for this visit.   PHYSICAL EXAMINATION:  Vitals:    09/19/22 1358  BP: (!) 140/86  Pulse: 86  Temp: 97.7 F (36.5 C)  SpO2: 98%   Filed Weights   09/19/22 1358  Weight: 164 lb (74.4 kg)    Physical Exam Vitals reviewed.  Constitutional:      Appearance: Normal appearance.  HENT:     Head: Normocephalic and atraumatic.     Mouth/Throat:     Pharynx: No oropharyngeal exudate.  Cardiovascular:     Rate and Rhythm: Normal rate and regular rhythm.  Pulmonary:     Effort: Pulmonary effort is normal. No respiratory distress.     Breath sounds: Normal breath sounds. No wheezing.  Abdominal:    Musculoskeletal:        General: No tenderness.     Cervical back: Normal range of motion and neck supple.  Skin:    General: Skin is warm.     Coloration: Skin is not pale.  Neurological:     Mental Status: She is alert and oriented to person, place, and time.  Psychiatric:        Mood and Affect: Affect normal.     LABORATORY DATA:  I have reviewed the data as listed Lab Results  Component Value Date   WBC 4.5 09/17/2022   HGB 13.9 09/17/2022   HCT 42.7 09/17/2022   MCV 94.9 09/17/2022   PLT 175 09/17/2022   Recent Labs    11/15/21 1110 12/27/21 0915 03/07/22 1349 05/01/22 0851 09/17/22 1334  NA 143   < > 138 142 139  K 4.2   < > 4.1 4.9 4.5  CL 110   < > 103 103 103  CO2 30   < > 26 32 29  GLUCOSE 94   < > 136* 86 91  BUN 16   < > 18 19 17   CREATININE 0.69   < > 0.75 0.88 0.85  CALCIUM 9.0   < > 8.8* 9.7 8.8*  GFRNONAA >60  --  >60  --  >60  PROT 7.2  --   --  7.2  --   ALBUMIN 4.1  --   --   --   --   AST 65*  --   --  22  --   ALT 58*  --   --  13  --   ALKPHOS 34*  --   --   --   --   BILITOT 0.6  --   --  0.7  --    < > = values in this interval not displayed.  Iron/TIBC/Ferritin/ %Sat    Component Value Date/Time   IRON 105 09/17/2022 1334   TIBC 374 09/17/2022 1334   FERRITIN 35 09/17/2022 1334   IRONPCTSAT 28 09/17/2022 1334   IRONPCTSAT 17 05/01/2022 0851    Encounter Diagnoses  Name  Primary?   Periumbilical mass Yes   Constipation, unspecified constipation type    Periumbilical abdominal pain    Intra-abdominal and pelvic swelling, mass and lump, unspecified site    Acquired iron deficiency anemia due to decreased absorption    B12 deficiency    Vitamin D deficiency     Acquired iron deficiency anemia due to decreased absorption Chronic and Secondary to gastric malabsorption status post gastric bypass.  Has done well on IV Iron- Venofer- Last dose was on 03/16/2022 Recent Hgb on 09/17/2022 was 13.9 which is down slightly from 14.4 4 months prior.  Iron is 105 ug/dL, TIBC is normal, Iron saturation is 28%, ferritin 35 She wishes to have one additional venofer infusion today as ferritin is not in goal range of 50-100.    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 Currently completing home B12 IM monthly B12 lab on 09/17/2022 showed a B12 of 557 She will continue monthly B12 injections   Vitamin D deficiency Chronic and secondary to malabsorption (see above) Currently taking OTC vitamin D 5000 IU Weekly Goal > 50 09/17/2022 Vit D was 78.35 Continue Daily 1,000 IU   Mild intermittent thrombocytopenia [place] w[place]- likely secondary to iron deficiency  Recent CBC was normal with platelets of 175  Abdominal Mass Needs further work up> CT discussed and agreeable by patient Reviewed red flag signs and symptoms  DISPOSITION:  Pt strongly prefers IV Iron today.  IV Venofer today 6 mo- labs (cbc, bmp, ferritin, iron studies, b12, vitamin d) Few days later- Dr. Donneta Romberg, +/- venofer- [place]  All questions were answered. The patient knows to call the clinic with any problems, questions or concerns.  Rushie Chestnut, PA-C 09/20/2022

## 2022-09-19 NOTE — Progress Notes (Signed)
Patient declined to wait the 30 minutes for post iron infusion observation today. Tolerated infusion well. VSS. 

## 2022-09-20 ENCOUNTER — Encounter: Payer: Self-pay | Admitting: Internal Medicine

## 2022-09-21 NOTE — Telephone Encounter (Signed)
Pls call pt. Discussed weaning off estradiol at her 6/24 appt. Pt on too high dose for her age. Is she weaning down? What does is she on?

## 2022-09-21 NOTE — Telephone Encounter (Signed)
Called pt, no answer, LVMTRC.

## 2022-09-25 NOTE — Telephone Encounter (Signed)
Spoke with pt. Discussed importance of weaning down on estradiol given her age and dose. Discussed benefits of vag ERT (was a cost issue but discussed cream would last her 6 months and could get for ~$30 with goodrx). She was on another line and said she'd call me back.

## 2022-09-26 ENCOUNTER — Encounter: Payer: Self-pay | Admitting: Internal Medicine

## 2022-09-27 ENCOUNTER — Other Ambulatory Visit: Payer: Medicare HMO

## 2022-09-27 ENCOUNTER — Other Ambulatory Visit: Payer: Self-pay

## 2022-09-27 ENCOUNTER — Telehealth: Payer: Self-pay | Admitting: *Deleted

## 2022-09-27 ENCOUNTER — Ambulatory Visit: Payer: Medicare HMO

## 2022-09-27 NOTE — Telephone Encounter (Signed)
Patient called reporting that her CT got cancelled because Clent Jacks said she never ordered it and it was not authorized. Patient states that it was to evaluate a mass in her stomach and is confused as to why Maralyn Sago said she did not order it. She is asking for someone to call her back regarding this. I have spoken with C Joseph Art who said she does not require auth as she has MEDICAID. I has sent a message to Barbette Hair in centralized scheduling to assist in understanding the issue and inform him that Maralyn Sago works in the [employer] not Emergency Medicine I am awaiting a response

## 2022-09-27 NOTE — Telephone Encounter (Signed)
I denied estradiol 1 mg RF from [place] until I can discuss further with pt.

## 2022-09-27 NOTE — Telephone Encounter (Signed)
PA was required and done and approved per C Woods. Centralized scheduling has called patient and she is scheduled for tomorrow at 430

## 2022-09-28 ENCOUNTER — Ambulatory Visit
Admission: RE | Admit: 2022-09-28 | Discharge: 2022-09-28 | Disposition: A | Discharge status: Home / Self Care | Payer: Medicare HMO | Source: Ambulatory Visit | Attending: Medical Oncology | Admitting: Medical Oncology

## 2022-09-28 DIAGNOSIS — R109 Unspecified abdominal pain: Secondary | ICD-10-CM | POA: Diagnosis not present

## 2022-09-28 DIAGNOSIS — R188 Other ascites: Secondary | ICD-10-CM | POA: Diagnosis not present

## 2022-09-28 DIAGNOSIS — K5900 Constipation, unspecified: Secondary | ICD-10-CM | POA: Diagnosis not present

## 2022-09-28 DIAGNOSIS — R1900 Intra-abdominal and pelvic swelling, mass and lump, unspecified site: Secondary | ICD-10-CM | POA: Diagnosis not present

## 2022-09-28 DIAGNOSIS — R1033 Periumbilical pain: Secondary | ICD-10-CM | POA: Diagnosis not present

## 2022-09-28 DIAGNOSIS — R1905 Periumbilic swelling, mass or lump: Secondary | ICD-10-CM | POA: Diagnosis not present

## 2022-09-28 MED ORDER — IOHEXOL 300 MG/ML  SOLN
100.0000 mL | Freq: Once | INTRAMUSCULAR | Status: AC | PRN
  Administered 2022-09-28: 100 mL via INTRAVENOUS

## 2022-10-02 ENCOUNTER — Telehealth: Payer: Self-pay | Admitting: Obstetrics and Gynecology

## 2022-10-02 NOTE — Telephone Encounter (Signed)
Pt requested Rx RF estradiol 1 mg through Mychart. We had discussed weaning down on ERT dose due to her age at 6/24 appt and start vag ERT for vag sx. Pt never got vag ERT due to cost and has not weaned down on estradiol 1 mg dose.    Spoke with pt. Discussed importance of weaning down on estradiol given her age and dose. Discussed benefits of vag ERT (was a cost issue but discussed cream would last her 6 months and could get for ~$30 with goodrx). She was on another line and said she'd call me back.       Never heard back from pt so denies [employer] RF. Will discuss further when she calls back.

## 2022-10-03 ENCOUNTER — Encounter: Payer: Self-pay | Admitting: Internal Medicine

## 2022-10-03 ENCOUNTER — Ambulatory Visit: Payer: Medicare HMO

## 2022-10-06 ENCOUNTER — Other Ambulatory Visit: Payer: Self-pay | Admitting: Psychiatry

## 2022-10-06 DIAGNOSIS — F4310 Post-traumatic stress disorder, unspecified: Secondary | ICD-10-CM

## 2022-10-11 ENCOUNTER — Other Ambulatory Visit: Payer: Self-pay | Admitting: Family Medicine

## 2022-10-11 DIAGNOSIS — J3089 Other allergic rhinitis: Secondary | ICD-10-CM

## 2022-10-11 NOTE — Telephone Encounter (Signed)
Requested Prescriptions  Pending Prescriptions Disp Refills   fluticasone (FLONASE) 50 MCG/ACT nasal spray [Pharmacy Med Name: FLUTICASONE PROP 50 MCG SPRAY] 48 mL 0    Sig: SPRAY 2 SPRAYS INTO EACH NOSTRIL EVERY DAY     Ear, Nose, and Throat: Nasal Preparations - Corticosteroids Passed - 10/11/2022  1:28 AM      Passed - Valid encounter within last 12 months    Recent Outpatient Visits           4 months ago Ventral hernia without obstruction or gangrene   [facility] [place], Netta Neat, DO   5 months ago Annual physical exam   [place] [facility] [place], Netta Neat, DO   6 months ago Eustachian tube dysfunction, bilateral   [place] [facility] Smitty Cords, DO   9 months ago Spinal stenosis of lumbar region without neurogenic claudication   [place] [facility] Smitty Cords, DO   11 months ago Hordeolum internum of right upper eyelid   [place] [facility] [place], Netta Neat, DO       Future Appointments             In 2 weeks Althea Charon, Netta Neat, DO [facility], [facility]

## 2022-10-15 ENCOUNTER — Telehealth: Payer: Self-pay | Admitting: Internal Medicine

## 2022-10-15 ENCOUNTER — Ambulatory Visit (INDEPENDENT_AMBULATORY_CARE_PROVIDER_SITE_OTHER): Payer: Medicare HMO | Admitting: Psychiatry

## 2022-10-15 ENCOUNTER — Encounter: Payer: Self-pay | Admitting: Psychiatry

## 2022-10-15 VITALS — BP 126/85 | HR 76 | Temp 97.3°F | Ht 62.0 in | Wt 160.2 lb

## 2022-10-15 DIAGNOSIS — F3178 Bipolar disorder, in full remission, most recent episode mixed: Secondary | ICD-10-CM | POA: Diagnosis not present

## 2022-10-15 DIAGNOSIS — F4310 Post-traumatic stress disorder, unspecified: Secondary | ICD-10-CM

## 2022-10-15 DIAGNOSIS — F410 Panic disorder [episodic paroxysmal anxiety] without agoraphobia: Secondary | ICD-10-CM

## 2022-10-15 DIAGNOSIS — G4701 Insomnia due to medical condition: Secondary | ICD-10-CM
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
## 2022-10-15 NOTE — Telephone Encounter (Signed)
Dr. B results are in, please review.

## 2022-10-15 NOTE — Telephone Encounter (Signed)
I spoke to patient regarding results of the CT scan- IMPRESSION: 3 x 1.8 cm fluid collection in the anterior abdominal wall to the left of the umbilicus, likely postoperative seroma.  Recommend follow-up in the clinic-sometime this week; MD no labs. Keep other appts as planned.  GB

## 2022-10-17 ENCOUNTER — Encounter: Payer: Self-pay | Admitting: Internal Medicine

## 2022-10-17 ENCOUNTER — Inpatient Hospital Stay: Payer: Medicare HMO | Attending: Internal Medicine | Admitting: Internal Medicine

## 2022-10-17 VITALS — BP 114/89 | HR 81 | Temp 97.1°F | Ht 62.0 in | Wt 159.0 lb

## 2022-10-17 DIAGNOSIS — D508 Other iron deficiency anemias: Secondary | ICD-10-CM | POA: Diagnosis present

## 2022-10-17 DIAGNOSIS — Z87891 Personal history of nicotine dependence: Secondary | ICD-10-CM | POA: Insufficient documentation

## 2022-10-17 DIAGNOSIS — K912 Postsurgical malabsorption, not elsewhere classified: Secondary | ICD-10-CM | POA: Diagnosis not present

## 2022-10-17 DIAGNOSIS — E559 Vitamin D deficiency, unspecified: Secondary | ICD-10-CM | POA: Insufficient documentation

## 2022-10-17 DIAGNOSIS — D696 Thrombocytopenia, unspecified: Secondary | ICD-10-CM | POA: Insufficient documentation

## 2022-10-17 DIAGNOSIS — E538 Deficiency of other specified B group vitamins: Secondary | ICD-10-CM | POA: Insufficient documentation

## 2022-10-17 DIAGNOSIS — K5900 Constipation, unspecified: Secondary | ICD-10-CM | POA: Diagnosis not present

## 2022-10-17 DIAGNOSIS — Z9884 Bariatric surgery status: Secondary | ICD-10-CM | POA: Diagnosis not present

## 2022-10-17 NOTE — Progress Notes (Signed)
[facility] CONSULT NOTE  Patient Care Team: Smitty Cords, DO as PCP - General (Family Medicine) Earna Coder, MD as Consulting Physician (Oncology)  CHIEF COMPLAINTS/PURPOSE OF CONSULTATION: Iron deficiency   HEMATOLOGY HISTORY  # ANEMIA IRON DEFICIENCY GASTRIC BYPASS [2005 [place], [place]] EGD-none; colonoscopy-2015- few polyps snared [[place]]; IV venofer x3; fall 2020/ PO B12 [intol to SL b12]    Latest Reference Range & Units 03/03/21 10:15  Iron 28 - 170 ug/dL 098  UIBC ug/dL 119  TIBC 147 - 829 ug/dL 562 (H)  Saturation Ratios 10.4 - 31.8 % 24  Ferritin 11 - 307 ng/mL 15  Vitamin B12 180 - 914 pg/mL 573  (H): Data is abnormally high  HISTORY OF PRESENTING ILLNESS: Patient ambulating-independently. Accompanied by family.   Ashley Fields 67 y.o.  female history of iron deficiency secondary to gastric bypass is here for follow-up and to review the results of the CT scan.   Patient concerned with knot located to left of belly button. Been there for a few months. Notices to not getting any better.   Notes to have Intermittent bloating; dyspepsia. Intermittent constipation.  Otherwise no blood in stools or black or stools.  Complains of mild to moderate fatigue.   Review of Systems  Constitutional:  Positive for malaise/fatigue. Negative for chills, diaphoresis, fever and weight loss.  HENT:  Negative for nosebleeds and sore throat.   Eyes:  Negative for double vision.  Respiratory:  Negative for cough, hemoptysis, sputum production and wheezing.   Cardiovascular:  Negative for chest pain, palpitations, orthopnea and leg swelling.  Gastrointestinal:  Negative for abdominal pain, blood in stool, constipation, diarrhea, heartburn, melena, nausea and vomiting.  Genitourinary:  Negative for dysuria, frequency and urgency.  Musculoskeletal:  Negative for back pain and joint pain.  Skin: Negative.  Negative for itching and rash.  Neurological:   Negative for dizziness, tingling, focal weakness, weakness and headaches.  Endo/Heme/Allergies:  Does not bruise/bleed easily.  Psychiatric/Behavioral:  Negative for depression. The patient is not nervous/anxious and does not have insomnia.     MEDICAL HISTORY:  Past Medical History:  Diagnosis Date   Allergy    Anemia    Anxiety    Arthritis    Yes   Back pain    Blood transfusion without reported diagnosis 1977   Transfusions from miscarriage & hemorrhaging   Chronic kidney disease    Depression    Disp fx of cuboid bone of right foot, init for clos fx 01/01/2017   Fibromyalgia    GERD (gastroesophageal reflux disease)    Headache    Hepatitis C    Hyperlipidemia    Lumbar radiculopathy    Neck pain    Neuromuscular disorder (HCC) 1997   Falling to much was an eye-opener   Osteoporosis    Thyroid disease    Urine incontinence     SURGICAL HISTORY: Past Surgical History:  Procedure Laterality Date   5 miscarriages     1977-1985, Blood transfusion s/p miscarriage 1977   ABDOMINAL EXPOSURE N/A 12/18/2021   Procedure: ABDOMINAL EXPOSURE;  Surgeon: Cephus Shelling, MD;  Location: [facility] OR;  Service: Vascular;  Laterality: N/A;   ABDOMINAL HYSTERECTOMY  1987   Total   ANTERIOR LUMBAR FUSION N/A 12/18/2021   Procedure: Anterior Lumbar Interbody Fusion  - Lumbar Five-Sacral One;  Surgeon: Tia Alert, MD;  Location: [facility] OR;  Service: Neurosurgery;  Laterality: N/A;   APPENDECTOMY  12/2008   AUGMENTATION MAMMAPLASTY  Bilateral 2015   Bilat   AUGMENTATION MAMMAPLASTY Bilateral 2018   implants redone w/ placement of implants under muscle   breast lift bilateral, implants  05/18/2015   bilateral, silicon naturel   BREAST REDUCTION SURGERY Bilateral 1997   CESAREAN SECTION  07/27/1981   Placenta Previa   CHOLECYSTECTOMY  03/2004   Lap surgery   COLONOSCOPY WITH PROPOFOL N/A 06/13/2020   Procedure: COLONOSCOPY WITH PROPOFOL;  Surgeon: Pasty Spillers, MD;   Location: [facility] ENDOSCOPY;  Service: Endoscopy;  Laterality: N/A;   COSMETIC SURGERY     Breast implants w/ lift, tummy tuck, upper & lower Blepharop   ESOPHAGOGASTRODUODENOSCOPY (EGD) WITH PROPOFOL N/A 06/13/2020   Procedure: ESOPHAGOGASTRODUODENOSCOPY (EGD) WITH PROPOFOL;  Surgeon: Pasty Spillers, MD;  Location: [facility] ENDOSCOPY;  Service: Endoscopy;  Laterality: N/A;   EYE SURGERY  2017   Cataract surgery & lasik   GASTRIC BYPASS  2005   Laparoscopic   IVC FILTER INSERTION  12/2003   TrapEase Vena Cava Filter   LUMBAR LAMINECTOMY  06/2006   L4-L5 (spinal fusion)   mini tummy tuck  05/07/2016   Bilateral bra/back roll lift skin removal   neck fusion     c3-c7 cadavier bones and metal plates placed per patient   neck surgery C3-C7  02/10/2015   ACDF   PROSTATE SURGERY     REDUCTION MAMMAPLASTY Bilateral 1997   SHOULDER ACROMIOPLASTY Left 03/27/2013   w/ labral debridement   SMALL INTESTINE SURGERY  12/24/2003   Lap Gastric Bypass   SPINAL CORD STIMULATOR IMPLANT  11/2010   removed 05/2011   SPINE SURGERY  2008 2017   Fusion L4-L5, ACDF C4-C7    SOCIAL HISTORY: Social History   Socioeconomic History   Marital status: Married    Spouse name: Not on file   Number of children: 1   Years of education: High School   Highest education level: Some college, no degree  Occupational History   Occupation: disability  Tobacco Use   Smoking status: Former    Current packs/day: 0.00    Average packs/day: 0.3 packs/day for 10.0 years (2.5 ttl pk-yrs)    Types: Cigarettes    Start date: 11/28/1985    Quit date: 11/29/1995    Years since quitting: 26.9    Passive exposure: Past   Smokeless tobacco: Former   Tobacco comments:    Quite 1997, didnt smoke hardly at all when I was smoking.  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]   Social Determinants of Health   Financial Resource Strain: Low Risk  (07/16/2022)   Overall Financial Resource Strain (CARDIA)    Difficulty of Paying Living Expenses: Not hard at all  Food Insecurity: No Food Insecurity (07/16/2022)   Hunger Vital Sign    Worried About Running Out of Food in the Last Year: Never true    Ran Out of Food in the Last Year: Never true  Transportation Needs: No Transportation Needs (07/16/2022)   PRAPARE - Administrator, Civil Service (Medical): No    Lack of Transportation (Non-Medical): No  Physical Activity:  Insufficiently Active (07/16/2022)   Exercise Vital Sign    Days of Exercise per Week: 3 days    Minutes of Exercise per Session: 30 min  Stress: Stress Concern Present (07/16/2022)   [employer] of Occupational Health - Occupational Stress Questionnaire    Feeling of Stress : Very much  Social Connections: Unknown (07/16/2022)   Social Connection and Isolation Panel [NHANES]    Frequency of Communication with Friends and Family: Once a week    Frequency of Social Gatherings with Friends and Family: Never    Attends Religious Services: Patient declined    Database administrator or Organizations: No    Attends Banker Meetings: Never    Marital Status: Married  Catering manager Violence: Not At Risk (07/20/2022)   Humiliation, Afraid, Rape, and Kick questionnaire    Fear of Current or Ex-Partner: No    Emotionally Abused: No    Physically Abused: No    Sexually Abused: No    FAMILY HISTORY: Family History  Problem Relation Age of Onset   COPD Mother    Lung cancer Mother 16   Diabetes Mother    Heart disease Mother    Stroke Mother    Heart attack Mother    Arthritis Mother    Hypertension Mother     Obesity Mother    Varicose Veins Mother    Heart disease Father    Heart attack Father 31   Early death Father    Depression Sister        x 5 sisters   COPD Sister    Hypertension Sister    Colon polyps Sister    Hearing loss Sister    Heart attack Sister    Arthritis Sister    Varicose Veins Sister    COPD Sister    Obesity Sister    Depression Sister    Depression Sister    Heart disease Sister    Hypertension Sister    Hypertension Sister    Obesity Sister    Heart disease Brother        x 3 brothers   Diabetes Brother    Heart attack Brother    Arthritis Brother    Heart disease Brother    Hypertension Brother    Diabetes Brother    Heart disease Brother    Hypertension Brother    Obesity Brother    Varicose Veins Brother    Heart disease Brother    Hypertension Brother    Obesity Brother    Miscarriages / Stillbirths Maternal Aunt    Miscarriages / Stillbirths Paternal Aunt    Breast cancer Paternal Aunt        early years   Cancer Maternal Grandfather    Stroke Maternal Grandfather     ALLERGIES:  is allergic to flagyl [metronidazole], cymbalta [duloxetine hcl], and tape.  MEDICATIONS:  Current Outpatient Medications  Medication Sig Dispense Refill   aspirin EC (ASPIRIN LOW DOSE) 81 MG tablet TAKE 1 TABLET BY MOUTH EVERY DAY 90 tablet 0   azelastine (ASTELIN) 0.1 % nasal spray Place 2 sprays into both nostrils 2 (two) times daily. Use in each nostril as directed 30 mL 11   bisacodyl (DULCOLAX) 5 MG EC tablet Take 5 mg by mouth daily as needed for moderate constipation.     butalbital-acetaminophen-caffeine (FIORICET) 50-325-40 MG tablet Take 1 tablet by mouth 2 (two) times daily as needed for headache.     Cholecalciferol 250 MCG (10000 UT) CAPS  Take 10,000 Units by mouth once a week.     cyanocobalamin (VITAMIN B12) 1000 MCG/ML injection INJECT 1 ML (1,000 MCG TOTAL) INTO THE MUSCLE EVERY 30 DAYS. 3 mL 3   diclofenac Sodium (VOLTAREN) 1 % GEL Apply 2 g  topically 4 (four) times daily as needed (pain).     estradiol (ESTRACE VAGINAL) 0.1 MG/GM vaginal cream Apply pea sized amount externally nightly for 1 week, then once weekly as maintenance 42.5 g 0   estradiol (ESTRACE) 1 MG tablet TAKE 1 TABLET EVERY DAY 90 tablet 1   etodolac (LODINE) 500 MG tablet Take 1 tablet by mouth 2 (two) times daily.     ezetimibe (ZETIA) 10 MG tablet Take 1 tablet (10 mg total) by mouth daily. 90 tablet 3   fluticasone (FLONASE) 50 MCG/ACT nasal spray SPRAY 2 SPRAYS INTO EACH NOSTRIL EVERY DAY 48 mL 0   gabapentin (NEURONTIN) 600 MG tablet Take 1 tablet (600 mg total) by mouth in the morning, at noon, in the evening, and at bedtime. 360 tablet 3   hydrOXYzine (VISTARIL) 50 MG capsule TAKE 1 CAPSULE (50 MG TOTAL) BY MOUTH 2 (TWO) TIMES DAILY AS NEEDED FOR ANXIETY. 180 capsule 1   omeprazole (PRILOSEC) 40 MG capsule Take 1 capsule (40 mg total) by mouth daily. 90 capsule 3   ondansetron (ZOFRAN ODT) 4 MG disintegrating tablet Take 1 tablet (4 mg total) by mouth every 8 (eight) hours as needed for nausea or vomiting. 30 tablet 2   Oxcarbazepine (TRILEPTAL) 300 MG tablet Take 1 tablet (300 mg total) by mouth 2 (two) times daily. 180 tablet 0   oxyCODONE-acetaminophen (PERCOCET) 7.5-325 MG tablet Take 1 tablet by mouth every 6 (six) hours as needed for severe pain.     rizatriptan (MAXALT-MLT) 10 MG disintegrating tablet Take 1 tablet (10 mg total) by mouth as needed for migraine. May repeat in 2 hours if needed, that is max dose for 24 hours. 10 tablet 3   rosuvastatin (CRESTOR) 40 MG tablet TAKE 1 TABLET BY MOUTH EVERY DAY 90 tablet 1   Tirzepatide-Weight Management (ZEPBOUND Hagan)      tiZANidine (ZANAFLEX) 4 MG tablet TAKE 1 TABLET BY MOUTH EVERY 6 HOURS 120 tablet 1   traZODone (DESYREL) 100 MG tablet Take 1-1.5 tablets (100-150 mg total) by mouth at bedtime as needed for sleep. 135 tablet 0   valACYclovir (VALTREX) 1000 MG tablet TAKE 1 TABLET (1,000 MG TOTAL) BY MOUTH  DAILY. FOR 5-7 DAYS, MAY REPEAT IF NEED FOR COLD SORE 90 tablet 1   venlafaxine XR (EFFEXOR XR) 37.5 MG 24 hr capsule Take 1 capsule (37.5 mg total) by mouth daily with breakfast. Take along with 75 mg daily 90 capsule 1   venlafaxine XR (EFFEXOR-XR) 75 MG 24 hr capsule Take 1 capsule (75 mg total) by mouth daily with breakfast. 90 capsule 1   Zoledronic Acid (ZOMETA) 4 MG/100ML IVPB Once per year from Orthopedic 100 mL    fluconazole (DIFLUCAN) 150 MG tablet TAKE ONE TABLET BY MOUTH ON DAY 1. REPEAT DOSE 2ND TABLET ON DAY 3. (Patient not taking: Reported on 10/15/2022) 2 tablet 0   furosemide (LASIX) 20 MG tablet Take 1 tablet (20 mg total) by mouth daily as needed for fluid or edema. (Patient not taking: Reported on 10/15/2022) 90 tablet 1   No current facility-administered medications for this visit.      PHYSICAL EXAMINATION:   Vitals:   10/17/22 1255  BP: 114/89  Pulse: 81  Temp: Marland Kitchen)  97.1 F (36.2 C)  SpO2: 100%   Filed Weights   10/17/22 1255  Weight: 159 lb (72.1 kg)   3 to 4 cm mass noted left of the umbilicus.  No significant tenderness.   Physical Exam HENT:     Head: Normocephalic and atraumatic.     Mouth/Throat:     Pharynx: No oropharyngeal exudate.  Eyes:     Pupils: Pupils are equal, round, and reactive to light.  Cardiovascular:     Rate and Rhythm: Normal rate and regular rhythm.  Pulmonary:     Effort: Pulmonary effort is normal. No respiratory distress.     Breath sounds: Normal breath sounds. No wheezing.  Abdominal:     General: Bowel sounds are normal. There is no distension.     Palpations: Abdomen is soft. There is no mass.     Tenderness: There is no abdominal tenderness. There is no guarding or rebound.  Musculoskeletal:        General: No tenderness. Normal range of motion.     Cervical back: Normal range of motion and neck supple.  Skin:    General: Skin is warm.  Neurological:     Mental Status: She is alert and oriented to person, place,  and time.  Psychiatric:        Mood and Affect: Affect normal.     LABORATORY DATA:  I have reviewed the data as listed Lab Results  Component Value Date   WBC 4.5 09/17/2022   HGB 13.9 09/17/2022   HCT 42.7 09/17/2022   MCV 94.9 09/17/2022   PLT 175 09/17/2022   Recent Labs    11/15/21 1110 12/27/21 0915 03/07/22 1349 05/01/22 0851 09/17/22 1334  NA 143   < > 138 142 139  K 4.2   < > 4.1 4.9 4.5  CL 110   < > 103 103 103  CO2 30   < > 26 32 29  GLUCOSE 94   < > 136* 86 91  BUN 16   < > 18 19 17   CREATININE 0.69   < > 0.75 0.88 0.85  CALCIUM 9.0   < > 8.8* 9.7 8.8*  GFRNONAA >60  --  >60  --  >60  PROT 7.2  --   --  7.2  --   ALBUMIN 4.1  --   --   --   --   AST 65*  --   --  22  --   ALT 58*  --   --  13  --   ALKPHOS 34*  --   --   --   --   BILITOT 0.6  --   --  0.7  --    < > = values in this interval not displayed.     CT ABDOMEN PELVIS W CONTRAST  Result Date: 10/14/2022 CLINICAL DATA:  Abdominal mass, intra-abdominal neoplasm suspected abdominal mass, abdominal pain, new constipation EXAM: CT ABDOMEN AND PELVIS WITH CONTRAST TECHNIQUE: Multidetector CT imaging of the abdomen and pelvis was performed using the standard protocol following bolus administration of intravenous contrast. RADIATION DOSE REDUCTION: This exam was performed according to the departmental dose-optimization program which includes automated exposure control, adjustment of the mA and/or kV according to patient size and/or use of iterative reconstruction technique. CONTRAST:  OMNIPAQUE IOHEXOL 300 MG/ML  SOLN COMPARISON:  Abdominal ultrasound 06/07/2022. FINDINGS: Lower chest: Coronary artery calcifications diffusely. No acute abnormality. Hepatobiliary: No focal hepatic abnormality. Gallbladder unremarkable. Pancreas: No focal  abnormality or ductal dilatation. Spleen: Benign calcification in the superior pole of the spleen. Normal size. No suspicious abnormality. Adrenals/Urinary Tract: No  adrenal abnormality. No focal renal abnormality. No stones or hydronephrosis. Urinary bladder is unremarkable. Stomach/Bowel: Stomach, large and small bowel grossly unremarkable. Prior gastric bypass. Vascular/Lymphatic: No evidence of aneurysm or adenopathy. IVC filter in place. Reproductive: Prior hysterectomy.  No adnexal masses. Other: No free fluid or free air. Musculoskeletal: No acute bony abnormality. Postoperative and degenerative changes in the lumbar spine. There is a fluid collection in the anterior abdominal wall to the left of the umbilicus measuring 3.0 x 1.8 cm, presumably the palpable mass. This likely reflects a postoperative seroma. IMPRESSION: 3 x 1.8 cm fluid collection in the anterior abdominal wall to the left of the umbilicus, likely postoperative seroma. Otherwise no intra-abdominal mass. Prior gastric bypass.  No complicating feature. No acute findings. Electronically Signed   By: Charlett Nose M.D.   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  On IV iron.  Positive fatigue. STABLE.   # Currently on maintenance IV iron infusions.    continue maintenance iron infusion as planned the next few months.  # Dyspepsia - EGD [June 2022; Dr.Tahiliani]-recommend continue omeprazole.  # Knot located to left of belly button [since dec 2023; since back surgery]-CT scan SEP 2024-  3 x 1.8 cm fluid collection in the anterior abdominal wall to the left of the umbilicus, likely postoperative seroma. Otherwise no intra-abdominal mass-likely secondary to prior tummy tuck in 2017.  Given otherwise asymptomatic nature of the fluid collection would not recommend any aspiration.  Again unrelated to her more recently December 2023 back surgery.  Monitor clinically for now.   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  Stable  # Mild intermittent thrombocytopenia-178- normal.   # DISPOSITION:  # keep follow up as planned-  Dr.B   All questions were answered. The patient knows to call the clinic with any problems, questions or concerns.    Earna Coder, MD 10/17/2022 2:27 PM

## 2022-10-17 NOTE — Assessment & Plan Note (Addendum)
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  On IV iron.  Positive fatigue. STABLE.   # Currently on maintenance IV iron infusions.    continue maintenance iron infusion as planned the next few months.  # Dyspepsia - EGD [June 2022; Dr.Tahiliani]-recommend continue omeprazole.  # Knot located to left of belly button [since dec 2023; since back surgery]-CT scan SEP 2024-  3 x 1.8 cm fluid collection in the anterior abdominal wall to the left of the umbilicus, likely postoperative seroma. Otherwise no intra-abdominal mass-likely secondary to prior tummy tuck in 2017.  Given otherwise asymptomatic nature of the fluid collection would not recommend any aspiration.  Again unrelated to her more recently December 2023 back surgery.  Monitor clinically for now.  Personally discussed with radiology.  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  Stable  # Mild intermittent thrombocytopenia-178- normal.   # DISPOSITION:  # keep follow up as planned-  Dr.B  # I reviewed the blood work- with the patient in detail; also reviewed the imaging independently [as summarized above]; and with the patient in detail.

## 2022-10-17 NOTE — Progress Notes (Signed)
Patient concerned with knot located to left of belly button. Been there for a few weeks.

## 2022-10-31 ENCOUNTER — Ambulatory Visit: Payer: Medicare HMO | Admitting: Family Medicine

## 2022-10-31 VITALS — BP 124/68 | HR 73 | Ht 62.0 in | Wt 157.0 lb

## 2022-10-31 DIAGNOSIS — Z90710 Acquired absence of both cervix and uterus: Secondary | ICD-10-CM | POA: Diagnosis not present

## 2022-10-31 DIAGNOSIS — G8929 Other chronic pain: Secondary | ICD-10-CM

## 2022-10-31 DIAGNOSIS — M25562 Pain in left knee: Secondary | ICD-10-CM

## 2022-10-31 DIAGNOSIS — Z6828 Body mass index (BMI) 28.0-28.9, adult: Secondary | ICD-10-CM

## 2022-10-31 MED ORDER — ESTRADIOL 1 MG PO TABS
1.0000 mg | ORAL_TABLET | Freq: Every day | ORAL | 1 refills | Status: DC
Start: 2022-10-31 — End: 2023-04-01

## 2022-10-31 NOTE — Patient Instructions (Addendum)
Thank you for coming to the office today.  DUE for FASTING BLOOD WORK (no food or drink after midnight before the lab appointment, only water or coffee without cream/sugar on the morning of)  SCHEDULE "Lab Only" visit in the morning at the clinic for lab draw in 6 MONTHS   - Make sure Lab Only appointment is at about 1 week before your next appointment, so that results will be available  For Lab Results, once available within 2-3 days of blood draw, you can can log in to MyChart online to view your results and a brief explanation. Also, we can discuss results at next follow-up visit.   Please schedule a Follow-up Appointment to: Return in about 6 months (around 05/01/2023) for 6 month fasting lab only then 1 week later Annual Physical.  If you have any other questions or concerns, please feel free to call the office or send a message through MyChart. You may also schedule an earlier appointment if necessary.  Additionally, you may be receiving a survey about your experience at our office within a few days to 1 week by e-mail or mail. We value your feedback.  Saralyn Pilar, DO [facility], [place]

## 2022-10-31 NOTE — Progress Notes (Unsigned)
Subjective:    Patient ID: Ashley Fields, female    DOB: 1955/02/21, 67 y.o.   MRN: [id number]  Ashley Fields is a 67 y.o. female presenting on 10/31/2022 for Knee Pain (Left knee, x 3 months, no known injury, possibly from compensating with her previous back injury) and Hyperlipidemia   HPI  Discussed the use of AI scribe software for clinical note transcription with the patient, who gave verbal consent to proceed.     The patient has a history of gastric bypass surgery in 2005 and has been maintaining her weight well since then.     Left Knee Pain, Chronic Reports symptoms >3 months with Left inner Believes it may related to increased demand or strain on left knee while back was being treated. She follows with [facility] for pain management, she is on Oxycodone 7.5/325 for her back and spine and it does help the Left knee. Upcoming considering a Knee injection. - Also on Tizanidine, Gabapentin, Etolodac She wears inserts in shoes to help shock absorb. Not wearing knee brace or sleeve. The medication appears to provide some relief for both the back and knee pain. The patient is considering requesting a knee injection from her orthopedic specialist, [facility], although she has not yet received one.  History of Morbid Obesity Weight >300, in 2005 she had gastric bypass Weight down 50+ lbs with Tirzepatide compounded. She reports initial peak weight 212 lbs, down about 57 lbs overall reports.   Health Maintenance: Updated Flu vaccine 08/2022 Updated TDap 08/2022 UTD Pneumonia vaccine, Prevnar     10/31/2022    8:35 AM 10/15/2022   12:01 PM 09/13/2022   11:31 AM  Depression screen PHQ 2/9  Decreased Interest 1    Down, Depressed, Hopeless 1    PHQ - 2 Score 2    Altered sleeping 0    Tired, decreased energy 1    Change in appetite 0    Feeling bad or failure about yourself  0    Trouble concentrating 0    Moving slowly or fidgety/restless 0    Suicidal thoughts 0     PHQ-9 Score 3    Difficult doing work/chores Not difficult at all       Information is confidential and restricted. Go to Review Flowsheets to unlock data.    Social History   Tobacco Use   Smoking status: Former    Current packs/day: 0.00    Average packs/day: 0.3 packs/day for 10.0 years (2.5 ttl pk-yrs)    Types: Cigarettes    Start date: 11/28/1985    Quit date: 11/29/1995    Years since quitting: 26.9    Passive exposure: Past   Smokeless tobacco: Former   Tobacco comments:    Quite 1997, didnt smoke hardly at all when I was smoking.  Vaping Use   Vaping status: Never Used  Substance Use Topics   Alcohol use: No   Drug use: No    Review of Systems Per HPI unless specifically indicated above     Objective:    BP 124/68   Pulse 73   Ht 5\' 2"  (1.575 m)   Wt 157 lb (71.2 kg)   SpO2 98%   BMI 28.72 kg/m   Wt Readings from Last 3 Encounters:  10/31/22 157 lb (71.2 kg)  10/17/22 159 lb (72.1 kg)  09/19/22 164 lb (74.4 kg)    Physical Exam Vitals and nursing note reviewed.  Constitutional:      General: She  is not in acute distress.    Appearance: She is well-developed. She is not diaphoretic.     Comments: Well-appearing, comfortable, cooperative  HENT:     Head: Normocephalic and atraumatic.  Eyes:     General:        Right eye: No discharge.        Left eye: No discharge.     Conjunctiva/sclera: Conjunctivae normal.  Neck:     Thyroid: No thyromegaly.  Cardiovascular:     Rate and Rhythm: Normal rate and regular rhythm.     Heart sounds: Normal heart sounds. No murmur heard. Pulmonary:     Effort: Pulmonary effort is normal. No respiratory distress.     Breath sounds: Normal breath sounds. No wheezing or rales.  Musculoskeletal:        General: Normal range of motion.     Cervical back: Normal range of motion and neck supple.  Lymphadenopathy:     Cervical: No cervical adenopathy.  Skin:    General: Skin is warm and dry.     Findings: No  erythema or rash.  Neurological:     Mental Status: She is alert and oriented to person, place, and time.  Psychiatric:        Behavior: Behavior normal.     Comments: Well groomed, good eye contact, normal speech and thoughts      Results for orders placed or performed in visit on 09/17/22  Vitamin D 25 hydroxy  Result Value Ref Range   Vit D, 25-Hydroxy 78.35 30 - 100 ng/mL  Vitamin B12  Result Value Ref Range   Vitamin B-12 557 180 - 914 pg/mL  Iron and TIBC([facility])  Result Value Ref Range   Iron 105 28 - 170 ug/dL   TIBC 161 096 - 045 ug/dL   Saturation Ratios 28 10.4 - 31.8 %   UIBC 269 ug/dL  Ferritin  Result Value Ref Range   Ferritin 35 11 - 307 ng/mL  Basic Metabolic Panel - Cancer Center Only  Result Value Ref Range   Sodium 139 135 - 145 mmol/L   Potassium 4.5 3.5 - 5.1 mmol/L   Chloride 103 98 - 111 mmol/L   CO2 29 22 - 32 mmol/L   Glucose, Bld 91 70 - 99 mg/dL   BUN 17 8 - 23 mg/dL   Creatinine 4.09 8.11 - 1.00 mg/dL   Calcium 8.8 (L) 8.9 - 10.3 mg/dL   GFR, Estimated >91 >47 mL/min   Anion gap 7 5 - 15  CBC with Differential (Cancer Center Only)  Result Value Ref Range   WBC Count 4.5 4.0 - 10.5 K/uL   RBC 4.50 3.87 - 5.11 MIL/uL   Hemoglobin 13.9 12.0 - 15.0 g/dL   HCT 82.9 56.2 - 13.0 %   MCV 94.9 80.0 - 100.0 fL   MCH 30.9 26.0 - 34.0 pg   MCHC 32.6 30.0 - 36.0 g/dL   RDW 86.5 78.4 - 69.6 %   Platelet Count 175 150 - 400 K/uL   nRBC 0.0 0.0 - 0.2 %   Neutrophils Relative % 57 %   Neutro Abs 2.6 1.7 - 7.7 K/uL   Lymphocytes Relative 32 %   Lymphs Abs 1.4 0.7 - 4.0 K/uL   Monocytes Relative 6 %   Monocytes Absolute 0.3 0.1 - 1.0 K/uL   Eosinophils Relative 4 %   Eosinophils Absolute 0.2 0.0 - 0.5 K/uL   Basophils Relative 1 %   Basophils Absolute 0.0 0.0 -  0.1 K/uL   Immature Granulocytes 0 %   Abs Immature Granulocytes 0.00 0.00 - 0.07 K/uL      Assessment & Plan:   Problem List Items Addressed This Visit     Chronic knee  pain (Left) - Primary (Chronic)   Other Visit Diagnoses     S/P hysterectomy       Relevant Medications   estradiol (ESTRACE) 1 MG tablet   BMI 28.0-28.9,adult          Assessment and Plan    Left Knee Pain Increased strain on the left knee possibly due to compensating for right leg back pain. Currently managed with oxycodone 7.5/325 which provides some relief. Patient considering knee injection. -Continue Oxycodone 7.5, 325 for pain management per Ortho -Consult with [facility] for potential knee injection. We can consider cortisone injection in future  Chronic Back Pain Managed with OxyContin 7.5, 325 and Tizanidine. Patient also attends pain management. -Continue current medication regimen and pain management.  Weight Loss Significant weight loss achieved (59 pounds since February 2024) with the aid of Triseptide. -Continue current weight management strategy.  General Health Maintenance -All vaccines up to date. -Continue with current medication regimen including B12 injections and estradiol. -Schedule follow-up appointment for April 2025.         Meds ordered this encounter  Medications   estradiol (ESTRACE) 1 MG tablet    Sig: Take 1 tablet (1 mg total) by mouth daily.    Dispense:  90 tablet    Refill:  1      Follow up plan: Return in about 6 months (around 05/01/2023) for 6 month fasting lab only then 1 week later Annual Physical.  Saralyn Pilar, DO [facility] 10/31/2022, 8:50 AM

## 2022-11-01 ENCOUNTER — Other Ambulatory Visit: Payer: Self-pay | Admitting: Family Medicine

## 2022-11-01 DIAGNOSIS — D509 Iron deficiency anemia, unspecified: Secondary | ICD-10-CM

## 2022-11-01 DIAGNOSIS — I739 Peripheral vascular disease, unspecified: Secondary | ICD-10-CM

## 2022-11-01 DIAGNOSIS — Z0000 Encounter for general adult medical examination without abnormal findings: Secondary | ICD-10-CM

## 2022-11-01 DIAGNOSIS — E782 Mixed hyperlipidemia: Secondary | ICD-10-CM

## 2022-11-01 DIAGNOSIS — E538 Deficiency of other specified B group vitamins: Secondary | ICD-10-CM

## 2022-11-01 DIAGNOSIS — Z6828 Body mass index (BMI) 28.0-28.9, adult: Secondary | ICD-10-CM

## 2022-11-01 DIAGNOSIS — R7309 Other abnormal glucose: Secondary | ICD-10-CM

## 2022-11-04 ENCOUNTER — Other Ambulatory Visit: Payer: Self-pay | Admitting: Psychiatry

## 2022-11-04 ENCOUNTER — Encounter: Payer: Self-pay | Admitting: Family Medicine

## 2022-11-04 DIAGNOSIS — N3000 Acute cystitis without hematuria: Secondary | ICD-10-CM

## 2022-11-04 DIAGNOSIS — F4310 Post-traumatic stress disorder, unspecified: Secondary | ICD-10-CM

## 2022-11-04 DIAGNOSIS — B379 Candidiasis, unspecified: Secondary | ICD-10-CM

## 2022-11-05 MED ORDER — FLUCONAZOLE 150 MG PO TABS
ORAL_TABLET | ORAL | 0 refills | Status: DC
Start: 2022-11-05 — End: 2023-02-06

## 2022-11-05 MED ORDER — CEPHALEXIN 500 MG PO CAPS
500.0000 mg | ORAL_CAPSULE | Freq: Three times a day (TID) | ORAL | 0 refills | Status: DC
Start: 2022-11-05 — End: 2023-02-13

## 2022-11-26 ENCOUNTER — Other Ambulatory Visit: Payer: Self-pay | Admitting: Family Medicine

## 2022-11-26 DIAGNOSIS — G8929 Other chronic pain: Secondary | ICD-10-CM

## 2022-11-27 NOTE — Telephone Encounter (Signed)
Requested medication (s) are due for refill today:   Provider to review  Requested medication (s) are on the active medication list:   Yes  Future visit scheduled:   Yes   Last ordered: 09/12/2022 #120, 1 refill  Non delegated refill    Requested Prescriptions  Pending Prescriptions Disp Refills   tiZANidine (ZANAFLEX) 4 MG tablet [Pharmacy Med Name: TIZANIDINE HCL 4 MG TABLET] 120 tablet 1    Sig: TAKE 1 TABLET BY MOUTH EVERY 6 HOURS     Not Delegated - Cardiovascular:  Alpha-2 [facility]on[facility] - tizanidine Failed - 11/26/2022  1:20 AM      Failed - This refill cannot be delegated      Passed - Valid encounter within last 6 months    Recent Outpatient Visits           3 weeks ago Chronic pain of left knee   [place] [facility], Netta Neat, DO   6 months ago Ventral hernia without obstruction or gangrene   [place] [facility] [place], Netta Neat, DO   7 months ago Annual physical exam   [place] [facility] [place], Netta Neat, DO   8 months ago Eustachian tube dysfunction, bilateral    [facility] Smitty Cords, DO   11 months ago Spinal stenosis of lumbar region without neurogenic claudication    [facility] [place], Netta Neat, DO       Future Appointments             In 5 months Althea Charon, Netta Neat, DO  [facility], [facility]

## 2022-12-04 ENCOUNTER — Other Ambulatory Visit: Payer: Self-pay | Admitting: Cardiovascular Disease

## 2022-12-04 DIAGNOSIS — R600 Localized edema: Secondary | ICD-10-CM

## 2022-12-04 NOTE — Telephone Encounter (Signed)
Last visit 02/05/22 with plan to f/u in 12 months.    Next visit: none/active recall

## 2022-12-09 ENCOUNTER — Other Ambulatory Visit: Payer: Self-pay | Admitting: Psychiatry

## 2022-12-09 DIAGNOSIS — F410 Panic disorder [episodic paroxysmal anxiety] without agoraphobia: Secondary | ICD-10-CM

## 2022-12-22 ENCOUNTER — Other Ambulatory Visit: Payer: Self-pay | Admitting: Psychiatry

## 2022-12-22 DIAGNOSIS — G4701 Insomnia due to medical condition: Secondary | ICD-10-CM

## 2022-12-31 ENCOUNTER — Telehealth: Payer: Medicare HMO | Admitting: Psychiatry

## 2023-01-08 ENCOUNTER — Telehealth: Payer: Medicare HMO | Admitting: Psychiatry

## 2023-01-08 ENCOUNTER — Encounter: Payer: Self-pay | Admitting: Psychiatry

## 2023-01-08 DIAGNOSIS — F410 Panic disorder [episodic paroxysmal anxiety] without agoraphobia: Secondary | ICD-10-CM | POA: Diagnosis not present

## 2023-01-08 DIAGNOSIS — G4701 Insomnia due to medical condition: Secondary | ICD-10-CM

## 2023-01-08 DIAGNOSIS — F3178 Bipolar disorder, in full remission, most recent episode mixed: Secondary | ICD-10-CM

## 2023-01-08 DIAGNOSIS — F4310 Post-traumatic stress disorder, unspecified: Secondary | ICD-10-CM | POA: Diagnosis not present

## 2023-01-08 NOTE — Progress Notes (Signed)
 Virtual Visit via Video Note  I connected with Ashley Fields on 01/08/23 at  8:30 AM EST by a video enabled telemedicine application and verified that I am speaking with the correct person using two identifiers.  Location Provider Location : [facility] Patient Location : Home  Participants: Patient , Provider   I discussed the limitations of evaluation and management by telemedicine and the availability of in person appointments. The patient expressed understanding and agreed to proceed.   I discussed the assessment and treatment plan with the patient. The patient was provided an opportunity to ask questions and all were answered. The patient agreed with the plan and demonstrated an understanding of the instructions.   The patient was advised to call back or seek an in-person evaluation if the symptoms worsen or if the condition fails to improve as anticipated.   BH MD OP Progress Note  01/08/2023 9:07 AM Ashley Fields  MRN:  [id number]  Chief Complaint:  Chief Complaint  Patient presents with   Anxiety   Depression   Medication Refill   HPI: Ashley Fields is a 67 year old Caucasian female, married, on disability, lives in [facility], has a history of bipolar disorder, PTSD, panic disorder, insomnia, chronic pain, migraine headaches, history of laparoscopic gastric bypass was evaluated by telemedicine today.  The patient reports a couple of instances where she felt the need to distance herself from her husband and experienced episodes of crying. These episodes seem to be triggered by arguments with her husband, primarily centered around his alcohol consumption. The patient notes that her relationship is harmonious when he is not drinking, but becomes contentious when he has consumed alcohol.  In addition to the marital discord, the patient also experienced a conflict with her daughter-in-law during a recent visit to [place] . The patient describes the daughter-in-law as being jealous of her  relationship with her son, leading to annual disputes. The most recent argument occurred unexpectedly and left the patient feeling disrespected.  Regarding her chronic pain, the patient reports that cold and rainy weather exacerbates her discomfort. However, she notes that her current medication regimen has improved her pain management, although she still experiences significant discomfort. She has been managing her pain by grinning and bearing it.  The patient's sleep pattern appears to be stable, with the aid of trazodone  allowing her to sleep for four to six hours. She denies any recent suicidal ideation or thoughts of harming others. Her medication regimen has remained consistent, with no new allergies reported. The patient did note a recent weight gain of five pounds over the holiday period.  The patient's emotional distress seems to be largely situational, stemming from interpersonal conflicts and her husband's alcohol consumption. She has considered leaving her husband if his drinking does not decrease or stop, and has a plan to move to [place]  and live in a travel trailer if necessary. Despite these stressors, the patient appears to be managing her bipolar disorder and PTSD symptoms with her current medication regimen.  Visit Diagnosis:    ICD-10-CM   1. Bipolar disorder, in full remission, most recent episode mixed (HCC)  F31.78     2. PTSD (post-traumatic stress disorder)  F43.10     3. Panic disorder  F41.0     4. Insomnia due to medical condition  G47.01    Pain, lack of sleep hygiene      Past Psychiatric History: I have reviewed past psychiatric history from progress note on 05/15/2018.  Past trials of Prozac, Wellbutrin, Lexapro,  Cymbalta, Rexulti , Ambien -nightmares, Lunesta , Lamictal , risperidone   Past Medical History:  Past Medical History:  Diagnosis Date   Allergy    Anemia    Anxiety    Arthritis    Yes   Back pain    Blood transfusion without reported diagnosis  1977   Transfusions from miscarriage & hemorrhaging   Chronic kidney disease    Depression    Disp fx of cuboid bone of right foot, init for clos fx 01/01/2017   Fibromyalgia    GERD (gastroesophageal reflux disease)    Headache    Hepatitis C    Hyperlipidemia    Lumbar radiculopathy    Neck pain    Neuromuscular disorder (HCC) 1997   Falling to much was an eye-opener   Osteoporosis    Thyroid  disease    Urine incontinence     Past Surgical History:  Procedure Laterality Date   5 miscarriages     1977-1985, Blood transfusion s/p miscarriage 1977   ABDOMINAL EXPOSURE N/A 12/18/2021   Procedure: ABDOMINAL EXPOSURE;  Surgeon: Gretta Lonni PARAS, MD;  Location: [facility] OR;  Service: Vascular;  Laterality: N/A;   ABDOMINAL HYSTERECTOMY  1987   Total   ANTERIOR LUMBAR FUSION N/A 12/18/2021   Procedure: Anterior Lumbar Interbody Fusion  - Lumbar Five-Sacral One;  Surgeon: Joshua Alm RAMAN, MD;  Location: [facility] OR;  Service: Neurosurgery;  Laterality: N/A;   APPENDECTOMY  12/2008   AUGMENTATION MAMMAPLASTY Bilateral 2015   Bilat   AUGMENTATION MAMMAPLASTY Bilateral 2018   implants redone w/ placement of implants under muscle   breast lift bilateral, implants  05/18/2015   bilateral, silicon naturel   BREAST REDUCTION SURGERY Bilateral 1997   CESAREAN SECTION  07/27/1981   Placenta Previa   CHOLECYSTECTOMY  03/2004   Lap surgery   COLONOSCOPY WITH PROPOFOL  N/A 06/13/2020   Procedure: COLONOSCOPY WITH PROPOFOL ;  Surgeon: Janalyn Keene NOVAK, MD;  Location: [facility] ENDOSCOPY;  Service: Endoscopy;  Laterality: N/A;   COSMETIC SURGERY     Breast implants w/ lift, tummy tuck, upper & lower Blepharop   ESOPHAGOGASTRODUODENOSCOPY (EGD) WITH PROPOFOL  N/A 06/13/2020   Procedure: ESOPHAGOGASTRODUODENOSCOPY (EGD) WITH PROPOFOL ;  Surgeon: Janalyn Keene NOVAK, MD;  Location: [facility] ENDOSCOPY;  Service: Endoscopy;  Laterality: N/A;   EYE SURGERY  2017   Cataract surgery & lasik   GASTRIC BYPASS  2005    Laparoscopic   IVC FILTER INSERTION  12/2003   TrapEase Vena Cava Filter   LUMBAR LAMINECTOMY  06/2006   L4-L5 (spinal fusion)   mini tummy tuck  05/07/2016   Bilateral bra/back roll lift skin removal   neck fusion     c3-c7 cadavier bones and metal plates placed per patient   neck surgery C3-C7  02/10/2015   ACDF   PROSTATE SURGERY     REDUCTION MAMMAPLASTY Bilateral 1997   SHOULDER ACROMIOPLASTY Left 03/27/2013   w/ labral debridement   SMALL INTESTINE SURGERY  12/24/2003   Lap Gastric Bypass   SPINAL CORD STIMULATOR IMPLANT  11/2010   removed 05/2011   SPINE SURGERY  2008 2017   Fusion L4-L5, ACDF C4-C7    Family Psychiatric History: I have reviewed family psychiatric history from progress note on 05/15/2018.  Family History:  Family History  Problem Relation Age of Onset   COPD Mother    Lung cancer Mother 36   Diabetes Mother    Heart disease Mother    Stroke Mother    Heart attack Mother    Arthritis Mother  Hypertension Mother    Obesity Mother    Varicose Veins Mother    Heart disease Father    Heart attack Father 53   Early death Father    Depression Sister        x 5 sisters   COPD Sister    Hypertension Sister    Colon polyps Sister    Hearing loss Sister    Heart attack Sister    Arthritis Sister    Varicose Veins Sister    COPD Sister    Obesity Sister    Depression Sister    Depression Sister    Heart disease Sister    Hypertension Sister    Hypertension Sister    Obesity Sister    Heart disease Brother        x 3 brothers   Diabetes Brother    Heart attack Brother    Arthritis Brother    Heart disease Brother    Hypertension Brother    Diabetes Brother    Heart disease Brother    Hypertension Brother    Obesity Brother    Varicose Veins Brother    Heart disease Brother    Hypertension Brother    Obesity Brother    Miscarriages / Stillbirths Maternal Aunt    Miscarriages / Stillbirths Paternal Aunt    Breast cancer Paternal  Aunt        early years   Cancer Maternal Grandfather    Stroke Maternal Grandfather     Social History: I have reviewed social history from progress note on 05/15/2018. Social History   Socioeconomic History   Marital status: Married    Spouse name: Not on file   Number of children: 1   Years of education: High School   Highest education level: Some college, no degree  Occupational History   Occupation: disability  Tobacco Use   Smoking status: Former    Current packs/day: 0.00    Average packs/day: 0.3 packs/day for 10.0 years (2.5 ttl pk-yrs)    Types: Cigarettes    Start date: 11/28/1985    Quit date: 11/29/1995    Years since quitting: 27.1    Passive exposure: Past   Smokeless tobacco: Former   Tobacco comments:    Quite 1997, didnt smoke hardly at all when I was smoking.  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]   Social Drivers of Corporate Investment Banker Strain: Low Risk  (10/30/2022)   Overall Financial Resource Strain (CARDIA)    Difficulty of Paying Living Expenses: Not very hard  Food Insecurity: Food Insecurity Present (10/30/2022)   Hunger Vital Sign    Worried About Running Out of Food in the Last Year: Never true    Ran Out of Food in the Last Year: Sometimes true  Transportation Needs: No Transportation Needs (10/30/2022)   PRAPARE - Administrator, Civil Service (Medical): No    Lack of Transportation (Non-Medical): No  Physical Activity: Sufficiently Active (10/30/2022)   Exercise Vital Sign    Days of Exercise per Week: 6 days    Minutes of  Exercise per Session: 50 min   Stress: No Stress Concern Present (10/30/2022)   [employer] of Occupational Health - Occupational Stress Questionnaire    Feeling of Stress : Only a little  Social Connections: Moderately Isolated (10/30/2022)   Social Connection and Isolation Panel [NHANES]    Frequency of Communication with Friends and Family: More than three times a week    Frequency of Social Gatherings with Friends and Family: Three times a week    Attends Religious Services: Never    Active Member of Clubs or Organizations: No    Attends Banker Meetings: Never    Marital Status: Married    Allergies:  Allergies  Allergen Reactions   Flagyl [Metronidazole] Anaphylaxis   Cymbalta [Duloxetine Hcl] Anxiety   Tape Rash    Paper tape okay to use per patient.    Metabolic Disorder Labs: Lab Results  Component Value Date   HGBA1C 5.4 05/01/2022   MPG 108 05/01/2022   MPG 91 04/28/2021   No results found for: PROLACTIN Lab Results  Component Value Date   CHOL 133 05/01/2022   TRIG 130 05/01/2022   HDL 69 05/01/2022   CHOLHDL 1.9 05/01/2022   LDLCALC 43 05/01/2022   LDLCALC 40 04/28/2021   Lab Results  Component Value Date   TSH 2.11 05/01/2022   TSH 3.29 04/28/2021    Therapeutic Level Labs: No results found for: LITHIUM No results found for: VALPROATE No results found for: CBMZ  Current Medications: Current Outpatient Medications  Medication Sig Dispense Refill   aspirin  EC (ASPIRIN  LOW DOSE) 81 MG tablet TAKE 1 TABLET BY MOUTH EVERY DAY 90 tablet 0   azelastine  (ASTELIN ) 0.1 % nasal spray Place 2 sprays into both nostrils 2 (two) times daily. Use in each nostril as directed 30 mL 11   bisacodyl  (DULCOLAX) 5 MG EC tablet Take 5 mg by mouth daily as needed for moderate constipation.     butalbital -acetaminophen -caffeine  (FIORICET) 50-325-40 MG tablet Take 1 tablet by mouth 2 (two) times daily as needed for headache.     cephALEXin  (KEFLEX ) 500 MG capsule Take 1  capsule (500 mg total) by mouth 3 (three) times daily. For 7 days 21 capsule 0   Cholecalciferol 250 MCG (10000 UT) CAPS Take 10,000 Units by mouth once a week.     cyanocobalamin  (VITAMIN B12) 1000 MCG/ML injection INJECT 1 ML (1,000 MCG TOTAL) INTO THE MUSCLE EVERY 30 DAYS. 3 mL 3   diclofenac Sodium (VOLTAREN) 1 % GEL Apply 2 g topically 4 (four) times daily as needed (pain).     estradiol  (ESTRACE  VAGINAL) 0.1 MG/GM vaginal cream Apply pea sized amount externally nightly for 1 week, then once weekly as maintenance 42.5 g 0   estradiol  (ESTRACE ) 1 MG tablet Take 1 tablet (1 mg total) by mouth daily. 90 tablet 1   etodolac  (LODINE ) 500 MG tablet Take 1 tablet by mouth 2 (two) times daily.     ezetimibe  (ZETIA ) 10 MG tablet Take 1 tablet (10 mg total) by mouth daily. 90 tablet 3   fluconazole  (DIFLUCAN ) 150 MG tablet Take one tablet by mouth on Day 1. Repeat dose 2nd tablet on Day 3. 2 tablet 0   fluticasone  (FLONASE ) 50 MCG/ACT nasal spray SPRAY 2 SPRAYS INTO EACH NOSTRIL EVERY DAY 48 mL 0   furosemide  (LASIX ) 20 MG tablet TAKE 1 TABLET ONE TIME DAILY AS NEEDED FOR FLUID OR EDEMA 90 tablet 1   gabapentin  (NEURONTIN ) 600 MG tablet Take 1  tablet (600 mg total) by mouth in the morning, at noon, in the evening, and at bedtime. 360 tablet 3   hydrOXYzine  (VISTARIL ) 50 MG capsule TAKE 1 CAPSULE (50 MG TOTAL) BY MOUTH 2 (TWO) TIMES DAILY AS NEEDED FOR ANXIETY. 180 capsule 1   omeprazole  (PRILOSEC) 40 MG capsule Take 1 capsule (40 mg total) by mouth daily. 90 capsule 3   ondansetron  (ZOFRAN  ODT) 4 MG disintegrating tablet Take 1 tablet (4 mg total) by mouth every 8 (eight) hours as needed for nausea or vomiting. 30 tablet 2   Oxcarbazepine  (TRILEPTAL ) 300 MG tablet TAKE 1 TABLET BY MOUTH 2 TIMES DAILY. 180 tablet 1   oxyCODONE -acetaminophen  (PERCOCET) 7.5-325 MG tablet Take 1 tablet by mouth every 6 (six) hours as needed for severe pain.     rizatriptan  (MAXALT -MLT) 10 MG disintegrating tablet Take 1  tablet (10 mg total) by mouth as needed for migraine. May repeat in 2 hours if needed, that is max dose for 24 hours. 10 tablet 3   rosuvastatin  (CRESTOR ) 40 MG tablet TAKE 1 TABLET BY MOUTH EVERY DAY 90 tablet 1   Tirzepatide-Weight Management (ZEPBOUND [place])      tiZANidine  (ZANAFLEX ) 4 MG tablet TAKE 1 TABLET BY MOUTH EVERY 6 HOURS 120 tablet 1   traZODone  (DESYREL ) 100 MG tablet Take 1-1.5 tablets (100-150 mg total) by mouth at bedtime as needed for sleep. 135 tablet 2   valACYclovir  (VALTREX ) 1000 MG tablet TAKE 1 TABLET (1,000 MG TOTAL) BY MOUTH DAILY. FOR 5-7 DAYS, MAY REPEAT IF NEED FOR COLD SORE 90 tablet 1   venlafaxine  XR (EFFEXOR  XR) 37.5 MG 24 hr capsule Take 1 capsule (37.5 mg total) by mouth daily with breakfast. Take along with 75 mg daily 90 capsule 1   venlafaxine  XR (EFFEXOR -XR) 75 MG 24 hr capsule Take 1 capsule (75 mg total) by mouth daily with breakfast. 90 capsule 1   Zoledronic  Acid (ZOMETA ) 4 MG/100ML IVPB Once per year from Orthopedic 100 mL    No current facility-administered medications for this visit.     Musculoskeletal: Strength & Muscle Tone:  UTA Gait & Station:  Seated Patient leans: N/A  Psychiatric Specialty Exam: Review of Systems  Psychiatric/Behavioral:  The patient is nervous/anxious.     There were no vitals taken for this visit.There is no height or weight on file to calculate BMI.  General Appearance: Casual  Eye Contact:  Fair  Speech:  Clear and Coherent  Volume:  Normal  Mood:  Anxious  Affect:  Appropriate  Thought Process:  Goal Directed and Descriptions of Associations: Intact  Orientation:  Full (Time, Place, and Person)  Thought Content: Logical   Suicidal Thoughts:  No  Homicidal Thoughts:  No  Memory:  Immediate;   Fair Recent;   Fair Remote;   Fair  Judgement:  Fair  Insight:  Fair  Psychomotor Activity:  Normal  Concentration:  Concentration: Fair and Attention Span: Fair  Recall:  [employer] of Knowledge: Fair   Language: Fair  Akathisia:  No  Handed:  Right  AIMS (if indicated): not done  Assets:  Communication Skills Desire for Improvement Housing Social Support  ADL's:  Intact  Cognition: WNL  Sleep:  Fair   Screenings: AIMS    Flowsheet Row Video Visit from 06/13/2021 in [facility] Video Visit from 05/16/2021 in [facility]  AIMS Total Score 0 0      GAD-7    Flowsheet  Row Office Visit from 10/31/2022 in [facility] Office Visit from 10/15/2022 in [facility] Office Visit from 09/13/2022 in [facility] Office Visit from 05/01/2022 in [facility] Video Visit from 01/15/2022 in [facility]  Total GAD-7 Score 4 7 12  0 9      PHQ2-9    Flowsheet Row Office Visit from 10/31/2022 in [facility] Office Visit from 10/15/2022 in [facility] Office Visit from 09/13/2022 in [facility] Clinical Support from 07/20/2022 in [facility] Office Visit from 05/01/2022 in [facility]  PHQ-2 Total Score 2 2 2  0 0  PHQ-9 Total Score 3 3 4  0 --      Flowsheet Row Video Visit from 01/08/2023 in [facility] Office Visit from 10/15/2022 in [facility] Office Visit from 09/13/2022 in [facility]  C-SSRS RISK CATEGORY No Risk No Risk No Risk        Assessment and Plan: Kashayla Ungerer is a 67 year old Caucasian female who has a history of PTSD, bipolar disorder, panic disorder, migraine headaches was evaluated by telemedicine today.  Patient with situational stressors as noted above  although managing okay, discussed assessment and plan as noted.  Bipolar Disorder in remission Currently on oxcarbazepine  300 mg twice daily and gabapentin  600 mg four times daily. Medication has improved pain but significant pain persists, especially in cold and rainy weather. Discussed medication risks, including side effects and need for regular liver function and CBC monitoring. - Continue oxcarbazepine  300 mg twice daily - Continue gabapentin  600 mg four times daily (prescribed for pain by primary care provider although mood stabilizer) - Monitor liver function and CBC regularly  Panic Disorder and PTSD-stable On venlafaxine  (75 mg in the morning and 37.5 mg in the afternoon) and hydroxyzine  as needed. Hydroxyzine  used sparingly, about four times since Thanksgiving, for nervousness and internal shaking. Discussed hydroxyzine  risks, including potential dependency, and advised to limit usage. - Continue venlafaxine  112.5 mg daily - Use hydroxyzine  50 mg twice a day as needed, but limit usage  Insomnia-stable On trazodone  100-150 mg at bedtime as needed. Reports sleeping 4-6 hours with medication. Discussed trazodone  risks, including potential dependency and side effects such as drowsiness. - Continue trazodone  100-150 mg at bedtime as needed  Chronic Pain Chronic pain exacerbated by cold and rainy weather. Managing pain with gabapentin . Discussed gabapentin  risks, including potential side effects and dependency. - Continue gabapentin  600 mg four times daily   Follow-up - Follow-up appointment on April 05, 2023, via video.   Collaboration of Care: Collaboration of Care: Primary Care Provider AEB patient encouraged to continue to follow up with primary care provider for management of pain.  Patient encouraged to get labs done including CBC, liver function ordered in the system by primary care.  Patient/Guardian was advised Release of Information must be obtained prior to any record  release in order to collaborate their care with an outside provider. Patient/Guardian was advised if they have not already done so to contact the registration department to sign all necessary forms in order for us  to release information regarding their care.   Consent: Patient/Guardian gives verbal consent for treatment and assignment of benefits for services provided during this visit. Patient/Guardian expressed understanding  and agreed to proceed.   This note was generated in part or whole with voice recognition software. Voice recognition is usually quite accurate but there are transcription errors that can and very often do occur. I apologize for any typographical errors that were not detected and corrected.    Jeovanni Heuring, MD 01/08/2023, 9:07 AM

## 2023-01-09 DIAGNOSIS — T8542XA Displacement of breast prosthesis and implant, initial encounter: Secondary | ICD-10-CM

## 2023-01-09 DIAGNOSIS — T792XXA Traumatic secondary and recurrent hemorrhage and seroma, initial encounter: Secondary | ICD-10-CM

## 2023-01-09 DIAGNOSIS — S301XXA Contusion of abdominal wall, initial encounter: Secondary | ICD-10-CM

## 2023-01-09 HISTORY — DX: Displacement of breast prosthesis and implant, initial encounter: T85.42XA

## 2023-01-09 HISTORY — DX: Contusion of abdominal wall, initial encounter: S30.1XXA

## 2023-01-09 HISTORY — DX: Traumatic secondary and recurrent hemorrhage and seroma, initial encounter: T79.2XXA

## 2023-01-10 ENCOUNTER — Other Ambulatory Visit: Payer: Self-pay | Admitting: Family Medicine

## 2023-01-10 DIAGNOSIS — Z981 Arthrodesis status: Secondary | ICD-10-CM | POA: Diagnosis not present

## 2023-01-10 DIAGNOSIS — M533 Sacrococcygeal disorders, not elsewhere classified: Secondary | ICD-10-CM | POA: Diagnosis not present

## 2023-01-10 DIAGNOSIS — M25552 Pain in left hip: Secondary | ICD-10-CM | POA: Diagnosis not present

## 2023-01-10 DIAGNOSIS — M48061 Spinal stenosis, lumbar region without neurogenic claudication: Secondary | ICD-10-CM | POA: Diagnosis not present

## 2023-01-10 DIAGNOSIS — M542 Cervicalgia: Secondary | ICD-10-CM | POA: Diagnosis not present

## 2023-01-10 DIAGNOSIS — Z79899 Other long term (current) drug therapy: Secondary | ICD-10-CM | POA: Diagnosis not present

## 2023-01-10 DIAGNOSIS — J3089 Other allergic rhinitis: Secondary | ICD-10-CM

## 2023-01-10 DIAGNOSIS — Z79891 Long term (current) use of opiate analgesic: Secondary | ICD-10-CM | POA: Diagnosis not present

## 2023-01-10 DIAGNOSIS — M47896 Other spondylosis, lumbar region: Secondary | ICD-10-CM | POA: Diagnosis not present

## 2023-01-10 DIAGNOSIS — G894 Chronic pain syndrome: Secondary | ICD-10-CM | POA: Diagnosis not present

## 2023-01-14 NOTE — Telephone Encounter (Signed)
 Requested Prescriptions  Pending Prescriptions Disp Refills   fluticasone  (FLONASE ) 50 MCG/ACT nasal spray [Pharmacy Med Name: FLUTICASONE  PROP 50 MCG SPRAY] 48 mL 0    Sig: SPRAY 2 SPRAYS INTO EACH NOSTRIL EVERY DAY     Ear, Nose, and Throat: Nasal Preparations - Corticosteroids Passed - 01/14/2023 10:01 AM      Passed - Valid encounter within last 12 months    Recent Outpatient Visits           2 months ago Chronic pain of left knee   [facility] [facility] [place], Marsa PARAS, DO   7 months ago Ventral hernia without obstruction or gangrene   [place] [facility] [place], Marsa PARAS, DO   8 months ago Annual physical exam   [place] [facility] [place] Marsa PARAS, DO   10 months ago Eustachian tube dysfunction, bilateral   [facility] [place], Marsa PARAS, DO   1 year ago Spinal stenosis of lumbar region without neurogenic claudication   [place] [facility] [place], Marsa PARAS, DO       Future Appointments             In 3 months [place], Marsa PARAS, DO [place]

## 2023-01-26 ENCOUNTER — Other Ambulatory Visit: Payer: Self-pay | Admitting: Family Medicine

## 2023-01-26 DIAGNOSIS — G8929 Other chronic pain: Secondary | ICD-10-CM

## 2023-01-28 NOTE — Telephone Encounter (Signed)
Requested medication (s) are due for refill today: Yes  Requested medication (s) are on the active medication list: Yes  Last refill:  11/27/22  Future visit scheduled: Yes  Notes to clinic:  Not delegated.    Requested Prescriptions  Pending Prescriptions Disp Refills   tiZANidine (ZANAFLEX) 4 MG tablet [Pharmacy Med Name: TIZANIDINE HCL 4 MG TABLET] 120 tablet 1    Sig: TAKE 1 TABLET BY MOUTH EVERY 6 HOURS     Not Delegated - Cardiovascular:  Alpha-2 Agonists - tizanidine Failed - 01/28/2023 10:18 AM      Failed - This refill cannot be delegated      Passed - Valid encounter within last 6 months    Recent Outpatient Visits           2 months ago Chronic pain of left knee   [place] [facility] [place], Netta Neat, DO   8 months ago Ventral hernia without obstruction or gangrene   [place] [facility] Smitty Cords, DO   9 months ago Annual physical exam   [place] [facility] Smitty Cords, DO   10 months ago Eustachian tube dysfunction, bilateral   [place] [facility] [place], Netta Neat, DO   1 year ago Spinal stenosis of lumbar region without neurogenic claudication   [place] [facility] [place], Netta Neat, DO       Future Appointments             In 3 months [place] Charon, Netta Neat, DO [facility], [facility]

## 2023-02-04 ENCOUNTER — Other Ambulatory Visit: Payer: Self-pay | Admitting: Family Medicine

## 2023-02-04 DIAGNOSIS — B379 Candidiasis, unspecified: Secondary | ICD-10-CM

## 2023-02-06 ENCOUNTER — Other Ambulatory Visit: Payer: Self-pay | Admitting: Medical

## 2023-02-06 DIAGNOSIS — E782 Mixed hyperlipidemia: Secondary | ICD-10-CM

## 2023-02-06 NOTE — Telephone Encounter (Signed)
Requested medication (s) are due for refill today- unsure  Requested medication (s) are on the active medication list -yes  Future visit scheduled -yes  Last refill: 11/05/22 #2  Notes to clinic: off protocol- provider review   Requested Prescriptions  Pending Prescriptions Disp Refills   fluconazole (DIFLUCAN) 150 MG tablet [Pharmacy Med Name: FLUCONAZOLE 150 MG TABLET] 2 tablet 0    Sig: Take one tablet by mouth on Day 1. Repeat dose 2nd tablet on Day 3.     Off-Protocol Failed - 02/06/2023  2:03 PM      Failed - Medication not assigned to a protocol, review manually.      Passed - Valid encounter within last 12 months    Recent Outpatient Visits           3 months ago Chronic pain of left knee   [facility] [place], Netta Neat, DO   8 months ago Ventral hernia without obstruction or gangrene   [facility] Smitty Cords, DO   9 months ago Annual physical exam   [facility] Smitty Cords, DO   10 months ago Eustachian tube dysfunction, bilateral   [facility] [place], Netta Neat, DO   1 year ago Spinal stenosis of lumbar region without neurogenic claudication   [place] Althea Charon, Netta Neat, DO       Future Appointments             In 2 months Althea Charon, Netta Neat, DO [place] [facility], [facility]               Requested Prescriptions  Pending Prescriptions Disp Refills   fluconazole (DIFLUCAN) 150 MG tablet [Pharmacy Med Name: FLUCONAZOLE 150 MG TABLET] 2 tablet 0    Sig: Take one tablet by mouth on Day 1. Repeat dose 2nd tablet on Day 3.     Off-Protocol Failed - 02/06/2023  2:03 PM      Failed - Medication not assigned to a protocol, review manually.      Passed - Valid encounter within last 12 months    Recent Outpatient Visits           3 months ago Chronic  pain of left knee   [place] [facility], Netta Neat, DO   8 months ago Ventral hernia without obstruction or gangrene   [facility] Smitty Cords, DO   9 months ago Annual physical exam   [facility] Smitty Cords, DO   10 months ago Eustachian tube dysfunction, bilateral   [facility] [place], Netta Neat, DO   1 year ago Spinal stenosis of lumbar region without neurogenic claudication   [facility] Althea Charon, Netta Neat, DO       Future Appointments             In 2 months Althea Charon, Netta Neat, DO [place] [facility], [facility]

## 2023-02-07 ENCOUNTER — Other Ambulatory Visit: Payer: Self-pay | Admitting: Family Medicine

## 2023-02-07 ENCOUNTER — Telehealth: Payer: Medicare HMO | Admitting: Physician Assistant

## 2023-02-07 DIAGNOSIS — Z90710 Acquired absence of both cervix and uterus: Secondary | ICD-10-CM

## 2023-02-07 DIAGNOSIS — R6889 Other general symptoms and signs: Secondary | ICD-10-CM | POA: Diagnosis not present

## 2023-02-07 MED ORDER — OSELTAMIVIR PHOSPHATE 75 MG PO CAPS
75.0000 mg | ORAL_CAPSULE | Freq: Two times a day (BID) | ORAL | 0 refills | Status: DC
Start: 2023-02-07 — End: 2023-02-13

## 2023-02-07 NOTE — Progress Notes (Signed)
I have spent 5 minutes in review of e-visit questionnaire, review and updating patient chart, medical decision making and response to patient.   Piedad Climes, PA-C

## 2023-02-07 NOTE — Progress Notes (Signed)
E visit for Flu like symptoms   We are sorry that you are not feeling well.  Here is how we plan to help! Based on what you have shared with me it looks like you may have a respiratory virus that may be influenza.  Influenza or "the flu" is   an infection caused by a respiratory virus. The flu virus is highly contagious and persons who did not receive their yearly flu vaccination may "catch" the flu from close contact.  We have anti-viral medications to treat the viruses that cause this infection. They are not a "cure" and only shorten the course of the infection. These prescriptions are most effective when they are given within the first 2 days of "flu" symptoms. Antiviral medication are indicated if you have a high risk of complications from the flu. You should  also consider an antiviral medication if you are in close contact with someone who is at risk. These medications can help patients avoid complications from the flu  but have side effects that you should know. Possible side effects from Tamiflu or oseltamivir include nausea, vomiting, diarrhea, dizziness, headaches, eye redness, sleep problems or other respiratory symptoms. You should not take Tamiflu if you have an allergy to oseltamivir or any to the ingredients in Tamiflu.  Based upon your symptoms and potential risk factors I have prescribed Oseltamivir (Tamiflu).  It has been sent to your designated pharmacy.  You will take one 75 mg capsule orally twice a day for the next 5 days. and I recommend that you follow the flu symptoms recommendation that I have listed below.  ANYONE WHO HAS FLU SYMPTOMS SHOULD: Stay home. The flu is highly contagious and going out or to work exposes others! Be sure to drink plenty of fluids. Water is fine as well as fruit juices, sodas and electrolyte beverages. You may want to stay away from caffeine or alcohol. If you are nauseated, try taking small sips of liquids. How do you know if you are getting enough  fluid? Your urine should be a pale yellow or almost colorless. Get rest. Taking a steamy shower or using a humidifier may help nasal congestion and ease sore throat pain. Using a saline nasal spray works much the same way. Cough drops, hard candies and sore throat lozenges may ease your cough. Line up a caregiver. Have someone check on you regularly.   GET HELP RIGHT AWAY IF: You cannot keep down liquids or your medications. You become short of breath Your fell like you are going to pass out or loose consciousness. Your symptoms persist after you have completed your treatment plan MAKE SURE YOU  Understand these instructions. Will watch your condition. Will get help right away if you are not doing well or get worse.  Your e-visit answers were reviewed by a board certified advanced clinical practitioner to complete your personal care plan.  Depending on the condition, your plan could have included both over the counter or prescription medications.  If there is a problem please reply  once you have received a response from your provider.  Your safety is important to [place].  If you have drug allergies check your prescription carefully.    You can use MyChart to ask questions about today's visit, request a non-urgent call back, or ask for a work or school excuse for 24 hours related to this e-Visit. If it has been greater than 24 hours you will need to follow up with your provider, or enter a  new e-Visit to address those concerns.  You will get an e-mail in the next two days asking about your experience.  I hope that your e-visit has been valuable and will speed your recovery. Thank you for using e-visits.

## 2023-02-08 NOTE — Telephone Encounter (Signed)
Requested Prescriptions  Refused Prescriptions Disp Refills   estradiol (ESTRACE) 1 MG tablet [Pharmacy Med Name: ESTRADIOL 1 MG TABLET] 90 tablet 1    Sig: TAKE 1 TABLET BY MOUTH EVERY DAY     OB/GYN:  Estrogens Passed - 02/08/2023  8:59 AM      Passed - Mammogram is up-to-date per Health Maintenance      Passed - Last BP in normal range    BP Readings from Last 1 Encounters:  10/31/22 124/68         Passed - Valid encounter within last 12 months    Recent Outpatient Visits           3 months ago Chronic pain of left knee   [place] [place], Netta Neat, DO   8 months ago Ventral hernia without obstruction or gangrene   [facility] Smitty Cords, DO   9 months ago Annual physical exam   [facility] Smitty Cords, DO   10 months ago Eustachian tube dysfunction, bilateral   [facility] [place], Netta Neat, DO   1 year ago Spinal stenosis of lumbar region without neurogenic claudication   [facility] [employer] [facility], Netta Neat, DO       Future Appointments             In 2 months Althea Charon, Netta Neat, DO [place] [facility], [facility]

## 2023-02-11 ENCOUNTER — Encounter: Payer: Self-pay | Admitting: Internal Medicine

## 2023-02-13 ENCOUNTER — Ambulatory Visit (INDEPENDENT_AMBULATORY_CARE_PROVIDER_SITE_OTHER): Payer: Medicare HMO | Admitting: Family Medicine

## 2023-02-13 ENCOUNTER — Encounter: Payer: Self-pay | Admitting: Family Medicine

## 2023-02-13 VITALS — BP 124/76 | HR 71 | Ht 62.0 in | Wt 155.0 lb

## 2023-02-13 DIAGNOSIS — R935 Abnormal findings on diagnostic imaging of other abdominal regions, including retroperitoneum: Secondary | ICD-10-CM | POA: Diagnosis not present

## 2023-02-13 DIAGNOSIS — R188 Other ascites: Secondary | ICD-10-CM

## 2023-02-13 DIAGNOSIS — J0110 Acute frontal sinusitis, unspecified: Secondary | ICD-10-CM | POA: Diagnosis not present

## 2023-02-13 MED ORDER — PREDNISONE 50 MG PO TABS
50.0000 mg | ORAL_TABLET | Freq: Every day | ORAL | 0 refills | Status: DC
Start: 2023-02-13 — End: 2023-03-20

## 2023-02-13 MED ORDER — AZITHROMYCIN 250 MG PO TABS
ORAL_TABLET | ORAL | 0 refills | Status: DC
Start: 2023-02-13 — End: 2023-03-14

## 2023-02-13 NOTE — Patient Instructions (Addendum)
 Thank you for coming to the office today.  Referral for MRI next stay tuned  Based on results. We can refer to Gen Surgery for 2nd opinion  [facility] - Dr Lucas Dubois Daz, MD  Start Azithromycin  Z pak (antibiotic) 2 tabs day 1, then 1 tab x 4 days, complete entire course even if improved   Please schedule a Follow-up Appointment to: Return if symptoms worsen or fail to improve.  If you have any other questions or concerns, please feel free to call the office or send a message through MyChart. You may also schedule an earlier appointment if necessary.  Additionally, you may be receiving a survey about your experience at our office within a few days to 1 week by e-mail or mail. We value your feedback.  Marsa Officer, DO [facility], [place]

## 2023-02-13 NOTE — Progress Notes (Signed)
 Subjective:    Patient ID: Ashley Fields, female    DOB: [birth year], 68 y.o.   MRN: [id number]  Ashley Fields is a 68 y.o. female presenting on 02/13/2023 for Sinusitis and Abdominal Mass   HPI  Discussed the use of AI scribe software for clinical note transcription with the patient, who gave verbal consent to proceed.  History of Present Illness    Ashley Fields is a 68 year old female who presents with abdominal pain and respiratory symptoms.  She has a persistent abdominal mass described as a 'big knot' in her stomach, present since May 2024. The mass is firm and hard, located in the anterior abdominal wall to the left of the umbilicus, On recent CT 09/2022  measuring approximately 3 by 1.8 centimeters. A CT scan identified a fluid collection, initially thought to be a postoperative seroma. Consultations with multiple surgeons have not identified a definitive cause, has seen Gen Surgery and also Vascular surgery. The mass causes discomfort, requiring her to wear high-waisted pants to avoid pressure. She also experiences bloating and swelling localized to the area of the mass.  In addition to the abdominal issues, she reports respiratory symptoms including a cough, sore throat, and sinus congestion. She describes a sensation of 'phlegm knot' in her throat and sinus pressure, accompanied by headaches and nausea. She has been using asthma spray and Flonase  for symptom relief. She was previously prescribed Tamiflu  for flu symptoms but did not take it as she did not feel she had the flu. She has also been experiencing itchy ears and believes her symptoms may be related to sinus issues.  Her past medical history includes gastric bypass surgery in 2005 and subsequent back surgery, after which she began experiencing urinary and bowel control issues. These symptoms have gradually improved over the past year.          10/31/2022    8:35 AM 10/15/2022   12:01 PM 09/13/2022   11:31 AM  Depression screen PHQ  2/9  Decreased Interest 1    Down, Depressed, Hopeless 1    PHQ - 2 Score 2    Altered sleeping 0    Tired, decreased energy 1    Change in appetite 0    Feeling bad or failure about yourself  0    Trouble concentrating 0    Moving slowly or fidgety/restless 0    Suicidal thoughts 0    PHQ-9 Score 3    Difficult doing work/chores Not difficult at all       Information is confidential and restricted. Go to Review Flowsheets to unlock data.       10/31/2022    8:35 AM 10/15/2022   12:02 PM 09/13/2022   11:31 AM 05/01/2022    8:04 AM  GAD 7 : Generalized Anxiety Score  Nervous, Anxious, on Edge 1   0  Control/stop worrying 0   0  Worry too much - different things 1   0  Trouble relaxing 1   0  Restless 0   0  Easily annoyed or irritable 1   0  Afraid - awful might happen 0   0  Total GAD 7 Score 4   0  Anxiety Difficulty         Information is confidential and restricted. Go to Review Flowsheets to unlock data.    Social History   Tobacco Use   Smoking status: Former    Current packs/day: 0.00    Average packs/day: 0.3 packs/day  for 10.0 years (2.5 ttl pk-yrs)    Types: Cigarettes    Start date: 11/28/1985    Quit date: 11/29/1995    Years since quitting: 27.2    Passive exposure: Past   Smokeless tobacco: Former   Tobacco comments:    Quite 1997, didnt smoke hardly at all when I was smoking.  Vaping Use   Vaping status: Never Used  Substance Use Topics   Alcohol use: No   Drug use: No    Review of Systems Per HPI unless specifically indicated above     Objective:    BP 124/76   Pulse 71   Ht 5' 2 (1.575 m)   Wt 155 lb (70.3 kg)   SpO2 97%   BMI 28.35 kg/m   Wt Readings from Last 3 Encounters:  02/13/23 155 lb (70.3 kg)  10/31/22 157 lb (71.2 kg)  10/17/22 159 lb (72.1 kg)    Physical Exam Vitals and nursing note reviewed.  Constitutional:      General: She is not in acute distress.    Appearance: Normal appearance. She is well-developed. She  is not diaphoretic.     Comments: Well-appearing, comfortable, cooperative  HENT:     Head: Normocephalic and atraumatic.  Eyes:     General:        Right eye: No discharge.        Left eye: No discharge.     Conjunctiva/sclera: Conjunctivae normal.  Cardiovascular:     Rate and Rhythm: Normal rate.  Pulmonary:     Effort: Pulmonary effort is normal.  Abdominal:     Hernia: A hernia (Similar finding to previous evaluation 05/2022, with area superior to umbilical region abdominal wall post op defect chronic issue feels like possible ventral hernia without active herniation.) is present.  Skin:    General: Skin is warm and dry.     Findings: No erythema, lesion or rash.  Neurological:     Mental Status: She is alert and oriented to person, place, and time.  Psychiatric:        Mood and Affect: Mood normal.        Behavior: Behavior normal.        Thought Content: Thought content normal.     Comments: Well groomed, good eye contact, normal speech and thoughts     I have personally reviewed the radiology report from 09/28/22 on CT Abd Pelvis.  CLINICAL DATA:  Abdominal mass, intra-abdominal neoplasm suspected abdominal mass, abdominal pain, new constipation   EXAM: CT ABDOMEN AND PELVIS WITH CONTRAST   TECHNIQUE: Multidetector CT imaging of the abdomen and pelvis was performed using the standard protocol following bolus administration of intravenous contrast.   RADIATION DOSE REDUCTION: This exam was performed according to the departmental dose-optimization program which includes automated exposure control, adjustment of the mA and/or kV according to patient size and/or use of iterative reconstruction technique.   CONTRAST:  OMNIPAQUE  IOHEXOL  300 MG/ML  SOLN   COMPARISON:  Abdominal ultrasound 06/07/2022.   FINDINGS: Lower chest: Coronary artery calcifications diffusely. No acute abnormality.   Hepatobiliary: No focal hepatic abnormality.  Gallbladder unremarkable.   Pancreas: No focal abnormality or ductal dilatation.   Spleen: Benign calcification in the superior pole of the spleen. Normal size. No suspicious abnormality.   Adrenals/Urinary Tract: No adrenal abnormality. No focal renal abnormality. No stones or hydronephrosis. Urinary bladder is unremarkable.   Stomach/Bowel: Stomach, large and small bowel grossly unremarkable. Prior gastric bypass.   Vascular/Lymphatic: No  evidence of aneurysm or adenopathy. IVC filter in place.   Reproductive: Prior hysterectomy.  No adnexal masses.   Other: No free fluid or free air.   Musculoskeletal: No acute bony abnormality. Postoperative and degenerative changes in the lumbar spine. There is a fluid collection in the anterior abdominal wall to the left of the umbilicus measuring 3.0 x 1.8 cm, presumably the palpable mass. This likely reflects a postoperative seroma.   IMPRESSION: 3 x 1.8 cm fluid collection in the anterior abdominal wall to the left of the umbilicus, likely postoperative seroma.   Otherwise no intra-abdominal mass.   Prior gastric bypass.  No complicating feature.   No acute findings.     Electronically Signed   By: Franky Crease M.D.   On: 10/14/2022 13:22  Results for orders placed or performed in visit on 09/17/22  Vitamin D  25 hydroxy   Collection Time: 09/17/22  1:34 PM  Result Value Ref Range   Vit D, 25-Hydroxy 78.35 30 - 100 ng/mL  Vitamin B12   Collection Time: 09/17/22  1:34 PM  Result Value Ref Range   Vitamin B-12 557 180 - 914 pg/mL  Iron  and TIBC([facility])   Collection Time: 09/17/22  1:34 PM  Result Value Ref Range   Iron  105 28 - 170 ug/dL   TIBC 625 749 - 549 ug/dL   Saturation Ratios 28 10.4 - 31.8 %   UIBC 269 ug/dL  Ferritin   Collection Time: 09/17/22  1:34 PM  Result Value Ref Range   Ferritin 35 11 - 307 ng/mL  Basic Metabolic Panel - Cancer Center Only   Collection Time: 09/17/22  1:34 PM  Result  Value Ref Range   Sodium 139 135 - 145 mmol/L   Potassium 4.5 3.5 - 5.1 mmol/L   Chloride 103 98 - 111 mmol/L   CO2 29 22 - 32 mmol/L   Glucose, Bld 91 70 - 99 mg/dL   BUN 17 8 - 23 mg/dL   Creatinine 9.14 9.55 - 1.00 mg/dL   Calcium  8.8 (L) 8.9 - 10.3 mg/dL   GFR, Estimated >39 >39 mL/min   Anion gap 7 5 - 15  CBC with Differential (Cancer Center Only)   Collection Time: 09/17/22  1:34 PM  Result Value Ref Range   WBC Count 4.5 4.0 - 10.5 K/uL   RBC 4.50 3.87 - 5.11 MIL/uL   Hemoglobin 13.9 12.0 - 15.0 g/dL   HCT 57.2 63.9 - 53.9 %   MCV 94.9 80.0 - 100.0 fL   MCH 30.9 26.0 - 34.0 pg   MCHC 32.6 30.0 - 36.0 g/dL   RDW 87.4 88.4 - 84.4 %   Platelet Count 175 150 - 400 K/uL   nRBC 0.0 0.0 - 0.2 %   Neutrophils Relative % 57 %   Neutro Abs 2.6 1.7 - 7.7 K/uL   Lymphocytes Relative 32 %   Lymphs Abs 1.4 0.7 - 4.0 K/uL   Monocytes Relative 6 %   Monocytes Absolute 0.3 0.1 - 1.0 K/uL   Eosinophils Relative 4 %   Eosinophils Absolute 0.2 0.0 - 0.5 K/uL   Basophils Relative 1 %   Basophils Absolute 0.0 0.0 - 0.1 K/uL   Immature Granulocytes 0 %   Abs Immature Granulocytes 0.00 0.00 - 0.07 K/uL      Assessment & Plan:   Problem List Items Addressed This Visit   None Visit Diagnoses       Acute non-recurrent frontal sinusitis    -  Primary   Relevant Medications   azithromycin  (ZITHROMAX  Z-PAK) 250 MG tablet   predniSONE  (DELTASONE ) 50 MG tablet     Abnormal CT of the abdomen       Relevant Orders   MR ABDOMEN W CONTRAST     Intra-abdominal fluid collection       Relevant Orders   MR ABDOMEN W CONTRAST         Abdominal Wall Mass / Abnormal Fluid Collection / Possible Post-op Seroma ?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 Showed persistent abdominal wall mass causing discomfort and pain. Previous CT scan showed a 3x1.8 cm fluid collection in the anterior abdominal wall to  the left of the umbilicus, thought to be a postoperative seroma. - Patient has seen two surgeons who have not provided a satisfactory explanation or plan. - Past history with bariatric surgery and more recently lumbar spinal fusion involving anterior surgical access by vascular surgery.  -Order MRI of the abdomen to further evaluate the abnormality  -Plan to refer to a different general surgeon locally ([facility]) for a second opinion, pending MRI results.  Sinusitis / Upper Respiratory Infection Patient reports cough, sore throat, and sinus congestion. Likely viral in origin, but has been ongoing. -Prescribe Z-Pak (Azithromycin ) for possible bacterial sinusitis. -Prescribe a steroid taper to reduce inflammation. -Advise over-the-counter Robitussin DM for cough relief.  Follow-up Await MRI results and then proceed with referral to general surgery.      Orders Placed This Encounter  Procedures   MR ABDOMEN W CONTRAST    Standing Status:   Future    Expiration Date:   02/13/2024    Reason for Exam (SYMPTOM  OR DIAGNOSIS REQUIRED):   abnormal CT abdomen, fluid collection, palpable mass    If indicated for the ordered procedure, I authorize the administration of contrast media per Radiology protocol:   Yes    What is the patient's sedation requirement?:   No Sedation    Does the patient have a pacemaker or implanted devices?:   No    Preferred imaging location?:   [facility] (table limit-350lbs)    Meds ordered this encounter  Medications   azithromycin  (ZITHROMAX  Z-PAK) 250 MG tablet    Sig: Take 2 tabs (500mg  total) on Day 1. Take 1 tab (250mg ) daily for next 4 days.    Dispense:  6 tablet    Refill:  0   predniSONE  (DELTASONE ) 50 MG tablet    Sig: Take 1 tablet (50 mg total) by mouth daily with breakfast.    Dispense:  5 tablet    Refill:  0    Follow up plan: Return if symptoms worsen or fail to improve.   Marsa Officer, DO [facility] 02/13/2023, 11:13 AM

## 2023-02-18 ENCOUNTER — Encounter: Payer: Self-pay | Admitting: Internal Medicine

## 2023-02-20 ENCOUNTER — Telehealth: Payer: Self-pay | Admitting: Family Medicine

## 2023-02-20 NOTE — Telephone Encounter (Signed)
Malia with [facility] is calling in because pt has an appointment for an MRI tomorrow and the order is wrong. Laurena Spies says the order needed to say MRI with and without contrast. Laurena Spies says she will change the order in Epic and she just needs the provider to sign off on it.

## 2023-02-20 NOTE — Telephone Encounter (Signed)
Thank you. I co-signed their orders.  Saralyn Pilar, DO [facility] 02/20/2023, 1:13 PM

## 2023-02-21 ENCOUNTER — Ambulatory Visit
Admission: RE | Admit: 2023-02-21 | Discharge: 2023-02-21 | Disposition: A | Discharge status: Home / Self Care | Payer: Medicare HMO | Source: Ambulatory Visit | Attending: Family Medicine | Admitting: Family Medicine

## 2023-02-21 DIAGNOSIS — Z9049 Acquired absence of other specified parts of digestive tract: Secondary | ICD-10-CM | POA: Diagnosis not present

## 2023-02-21 DIAGNOSIS — R188 Other ascites: Secondary | ICD-10-CM | POA: Insufficient documentation

## 2023-02-21 DIAGNOSIS — R935 Abnormal findings on diagnostic imaging of other abdominal regions, including retroperitoneum: Secondary | ICD-10-CM | POA: Diagnosis not present

## 2023-02-21 MED ORDER — GADOBUTROL 1 MMOL/ML IV SOLN
7.0000 mL | Freq: Once | INTRAVENOUS | Status: AC | PRN
  Administered 2023-02-21: 7 mL via INTRAVENOUS

## 2023-02-22 ENCOUNTER — Encounter: Payer: Self-pay | Admitting: Family Medicine

## 2023-02-22 DIAGNOSIS — S301XXS Contusion of abdominal wall, sequela: Secondary | ICD-10-CM

## 2023-02-22 DIAGNOSIS — R188 Other ascites: Secondary | ICD-10-CM

## 2023-03-04 ENCOUNTER — Other Ambulatory Visit: Payer: Self-pay | Admitting: Psychiatry

## 2023-03-04 DIAGNOSIS — F4310 Post-traumatic stress disorder, unspecified: Secondary | ICD-10-CM

## 2023-03-09 ENCOUNTER — Other Ambulatory Visit: Payer: Self-pay | Admitting: Psychiatry

## 2023-03-09 DIAGNOSIS — F3178 Bipolar disorder, in full remission, most recent episode mixed: Secondary | ICD-10-CM

## 2023-03-10 ENCOUNTER — Encounter: Payer: Self-pay | Admitting: Family Medicine

## 2023-03-10 DIAGNOSIS — T8542XA Displacement of breast prosthesis and implant, initial encounter: Secondary | ICD-10-CM

## 2023-03-12 NOTE — Addendum Note (Signed)
 Addended by: Smitty Cords on: 03/12/2023 06:53 PM   Modules accepted: Orders

## 2023-03-14 ENCOUNTER — Ambulatory Visit: Admitting: Family Medicine

## 2023-03-14 ENCOUNTER — Other Ambulatory Visit: Payer: Self-pay | Admitting: Family Medicine

## 2023-03-14 ENCOUNTER — Encounter: Payer: Self-pay | Admitting: Family Medicine

## 2023-03-14 VITALS — BP 122/72 | HR 80 | Ht 62.0 in | Wt 147.0 lb

## 2023-03-14 DIAGNOSIS — M533 Sacrococcygeal disorders, not elsewhere classified: Secondary | ICD-10-CM | POA: Diagnosis not present

## 2023-03-14 DIAGNOSIS — Z981 Arthrodesis status: Secondary | ICD-10-CM | POA: Diagnosis not present

## 2023-03-14 DIAGNOSIS — H60543 Acute eczematoid otitis externa, bilateral: Secondary | ICD-10-CM

## 2023-03-14 DIAGNOSIS — Z79899 Other long term (current) drug therapy: Secondary | ICD-10-CM | POA: Diagnosis not present

## 2023-03-14 DIAGNOSIS — I959 Hypotension, unspecified: Secondary | ICD-10-CM

## 2023-03-14 DIAGNOSIS — M5412 Radiculopathy, cervical region: Secondary | ICD-10-CM | POA: Diagnosis not present

## 2023-03-14 DIAGNOSIS — Z79891 Long term (current) use of opiate analgesic: Secondary | ICD-10-CM | POA: Diagnosis not present

## 2023-03-14 DIAGNOSIS — M791 Myalgia, unspecified site: Secondary | ICD-10-CM | POA: Diagnosis not present

## 2023-03-14 DIAGNOSIS — M47896 Other spondylosis, lumbar region: Secondary | ICD-10-CM | POA: Diagnosis not present

## 2023-03-14 DIAGNOSIS — M25552 Pain in left hip: Secondary | ICD-10-CM | POA: Diagnosis not present

## 2023-03-14 DIAGNOSIS — G894 Chronic pain syndrome: Secondary | ICD-10-CM | POA: Diagnosis not present

## 2023-03-14 DIAGNOSIS — M48061 Spinal stenosis, lumbar region without neurogenic claudication: Secondary | ICD-10-CM | POA: Diagnosis not present

## 2023-03-14 DIAGNOSIS — M542 Cervicalgia: Secondary | ICD-10-CM | POA: Diagnosis not present

## 2023-03-14 DIAGNOSIS — M461 Sacroiliitis, not elsewhere classified: Secondary | ICD-10-CM | POA: Diagnosis not present

## 2023-03-14 MED ORDER — TRIAMCINOLONE ACETONIDE 0.5 % EX OINT
1.0000 | TOPICAL_OINTMENT | Freq: Two times a day (BID) | CUTANEOUS | 2 refills | Status: DC
Start: 2023-03-14 — End: 2023-10-08

## 2023-03-14 NOTE — Progress Notes (Signed)
 Subjective:    Patient ID: Ashley Fields, female    DOB: [birth year], 68 y.o.   MRN: [id number]  Ashley Fields is a 68 y.o. female presenting on 03/14/2023 for blood pressure (Blood pressure was low this morning at Ortho appointment, patient states she was tired and had not had anything to eat. )   HPI  Discussed the use of AI scribe software for clinical note transcription with the patient, who gave verbal consent to proceed.  History of Present Illness   Ashley Fields is a 68 year old female who presents with concerns about low blood pressure readings.  She experienced low blood pressure readings during a visit to another doctor's office yesterday. Initial measurements using a wrist device were 70/50, followed by 80/60 with a different method. She felt very sleepy at the time, attributing this to fasting before the appointment. After eating, her blood pressure was '120/72'. She does not regularly check her blood pressure at home, but typical readings are around 'one twenty over 61' or 'one twenty over sixty'. She does not take antihypertensive medications and last took a diuretic pill last year. No symptoms of low blood pressure such as dizziness or lightheadedness, even while driving.  She received a letter indicating it was time for another colonoscopy, although she had one in June 2022 with a recommendation for a 5-10 year follow-up. She prefers not to undergo another colonoscopy at this time and has not done a Cologuard test since her last colonoscopy. She recalls having small polyps removed during her last colonoscopy, which were considered insignificant by her previous doctor in [place].  She has a breast implant that has shifted, with the implant starting further back than expected. She previously consulted with the original surgeon who indicated the implant had traveled, but the cost of correction was prohibitive. She has an upcoming appointment with a plastic surgeon for further  evaluation.          03/14/2023   11:44 AM 10/31/2022    8:35 AM 10/15/2022   12:01 PM  Depression screen PHQ 2/9  Decreased Interest 1 1   Down, Depressed, Hopeless 0 1   PHQ - 2 Score 1 2   Altered sleeping 1 0   Tired, decreased energy 1 1   Change in appetite 0 0   Feeling bad or failure about yourself  0 0   Trouble concentrating 0 0   Moving slowly or fidgety/restless 0 0   Suicidal thoughts 0 0   PHQ-9 Score 3 3   Difficult doing work/chores Somewhat difficult Not difficult at all      Information is confidential and restricted. Go to Review Flowsheets to unlock data.       03/14/2023   11:44 AM 10/31/2022    8:35 AM 10/15/2022   12:02 PM 09/13/2022   11:31 AM  GAD 7 : Generalized Anxiety Score  Nervous, Anxious, on Edge 1 1    Control/stop worrying 0 0    Worry too much - different things 0 1    Trouble relaxing 1 1    Restless 0 0    Easily annoyed or irritable 1 1    Afraid - awful might happen 0 0    Total GAD 7 Score 3 4    Anxiety Difficulty Not difficult at all        Information is confidential and restricted. Go to Review Flowsheets to unlock data.    Social History   Tobacco Use  Smoking status: Former    Current packs/day: 0.00    Average packs/day: 0.3 packs/day for 10.0 years (2.5 ttl pk-yrs)    Types: Cigarettes    Start date: 11/28/1985    Quit date: 11/29/1995    Years since quitting: 27.3    Passive exposure: Past   Smokeless tobacco: Former   Tobacco comments:    Quite 1997, didnt smoke hardly at all when I was smoking.  Vaping Use   Vaping status: Never Used  Substance Use Topics   Alcohol use: No   Drug use: No    Review of Systems Per HPI unless specifically indicated above     Objective:    BP 122/72 (BP Location: Left Arm, Patient Position: Sitting, Cuff Size: Normal)   Pulse 80   Ht 5\' 2"  (1.575 m)   Wt 147 lb (66.7 kg)   SpO2 97%   BMI 26.89 kg/m   Wt Readings from Last 3 Encounters:  03/14/23 147 lb (66.7 kg)   02/13/23 155 lb (70.3 kg)  10/31/22 157 lb (71.2 kg)    Physical Exam Vitals and nursing note reviewed.  Constitutional:      General: She is not in acute distress.    Appearance: Normal appearance. She is well-developed. She is not diaphoretic.     Comments: Well-appearing, comfortable, cooperative  HENT:     Head: Normocephalic and atraumatic.  Eyes:     General:        Right eye: No discharge.        Left eye: No discharge.     Conjunctiva/sclera: Conjunctivae normal.  Cardiovascular:     Rate and Rhythm: Normal rate.  Pulmonary:     Effort: Pulmonary effort is normal.  Skin:    General: Skin is warm and dry.     Findings: No erythema or rash.  Neurological:     Mental Status: She is alert and oriented to person, place, and time.  Psychiatric:        Mood and Affect: Mood normal.        Behavior: Behavior normal.        Thought Content: Thought content normal.     Comments: Well groomed, good eye contact, normal speech and thoughts     Results for orders placed or performed in visit on 09/17/22  Vitamin D 25 hydroxy   Collection Time: 09/17/22  1:34 PM  Result Value Ref Range   Vit D, 25-Hydroxy 78.35 30 - 100 ng/mL  Vitamin B12   Collection Time: 09/17/22  1:34 PM  Result Value Ref Range   Vitamin B-12 557 180 - 914 pg/mL  Iron and TIBC([facility])   Collection Time: 09/17/22  1:34 PM  Result Value Ref Range   Iron 105 28 - 170 ug/dL   TIBC 161 096 - 045 ug/dL   Saturation Ratios 28 10.4 - 31.8 %   UIBC 269 ug/dL  Ferritin   Collection Time: 09/17/22  1:34 PM  Result Value Ref Range   Ferritin 35 11 - 307 ng/mL  Basic Metabolic Panel - Cancer Center Only   Collection Time: 09/17/22  1:34 PM  Result Value Ref Range   Sodium 139 135 - 145 mmol/L   Potassium 4.5 3.5 - 5.1 mmol/L   Chloride 103 98 - 111 mmol/L   CO2 29 22 - 32 mmol/L   Glucose, Bld 91 70 - 99 mg/dL   BUN 17 8 - 23 mg/dL   Creatinine 4.09 8.11 - 1.00 mg/dL  Calcium 8.8 (L) 8.9 -  10.3 mg/dL   GFR, Estimated >60 >45 mL/min   Anion gap 7 5 - 15  CBC with Differential (Cancer Center Only)   Collection Time: 09/17/22  1:34 PM  Result Value Ref Range   WBC Count 4.5 4.0 - 10.5 K/uL   RBC 4.50 3.87 - 5.11 MIL/uL   Hemoglobin 13.9 12.0 - 15.0 g/dL   HCT 40.9 81.1 - 91.4 %   MCV 94.9 80.0 - 100.0 fL   MCH 30.9 26.0 - 34.0 pg   MCHC 32.6 30.0 - 36.0 g/dL   RDW 78.2 95.6 - 21.3 %   Platelet Count 175 150 - 400 K/uL   nRBC 0.0 0.0 - 0.2 %   Neutrophils Relative % 57 %   Neutro Abs 2.6 1.7 - 7.7 K/uL   Lymphocytes Relative 32 %   Lymphs Abs 1.4 0.7 - 4.0 K/uL   Monocytes Relative 6 %   Monocytes Absolute 0.3 0.1 - 1.0 K/uL   Eosinophils Relative 4 %   Eosinophils Absolute 0.2 0.0 - 0.5 K/uL   Basophils Relative 1 %   Basophils Absolute 0.0 0.0 - 0.1 K/uL   Immature Granulocytes 0 %   Abs Immature Granulocytes 0.00 0.00 - 0.07 K/uL      Assessment & Plan:   Problem List Items Addressed This Visit   None Visit Diagnoses       Hypotension, unspecified hypotension type    -  Primary         Transient Hypotension Low blood pressure readings at orthopedic office, but asymptomatic. Blood pressure reading in clinic today was 122/72. No antihypertensive medications. Possible contributing factors include early morning appointment, empty stomach, and recent medication intake. Normal BP readings now -Advise to monitor blood pressure at home. -Send a letter to orthopedic office regarding blood pressure reading today. Cleared to procedure w/ injection  Colon Cancer Screening Received a letter suggesting a need for colonoscopy, but last colonoscopy was only 3 years ago with no significant findings. Patient prefers not to repeat colonoscopy at this time. -Advise patient that current guidelines suggest next colonoscopy or Cologuard test in 2 more years.  Breast Implant Displacement Implant has moved posteriorly. Consultation with plastic surgeon scheduled for April 10, 2023. -No additional plan until consultation with plastic surgeon.         No orders of the defined types were placed in this encounter.   No orders of the defined types were placed in this encounter.   Follow up plan: Return if symptoms worsen or fail to improve.   Saralyn Pilar, DO [facility] 03/14/2023, 11:44 AM

## 2023-03-14 NOTE — Patient Instructions (Addendum)
 Thank you for coming to the office today.  BP reading today is normal  My best advice is that you had a lower reading early in AM, empty stomach, less hydrated.  Note that you do not take any BP medication.  If you are asymptomatic and having a lower BP reading that is okay, caution with sudden position.  BP 122/72 (BP Location: Left Arm, Patient Position: Sitting, Cuff Size: Normal)   Pulse 80   Ht 5\' 2"  (1.575 m)   Wt 147 lb (66.7 kg)   SpO2 97%   BMI 26.89 kg/m    Please schedule a Follow-up Appointment to: Return if symptoms worsen or fail to improve.  If you have any other questions or concerns, please feel free to call the office or send a message through MyChart. You may also schedule an earlier appointment if necessary.  Additionally, you may be receiving a survey about your experience at our office within a few days to 1 week by e-mail or mail. We value your feedback.  Ashley Pilar, DO [facility], [place]

## 2023-03-18 ENCOUNTER — Other Ambulatory Visit: Payer: Self-pay

## 2023-03-18 DIAGNOSIS — E538 Deficiency of other specified B group vitamins: Secondary | ICD-10-CM

## 2023-03-18 DIAGNOSIS — E559 Vitamin D deficiency, unspecified: Secondary | ICD-10-CM

## 2023-03-18 DIAGNOSIS — D508 Other iron deficiency anemias: Secondary | ICD-10-CM

## 2023-03-19 ENCOUNTER — Inpatient Hospital Stay: Payer: Self-pay | Attending: Internal Medicine

## 2023-03-19 ENCOUNTER — Other Ambulatory Visit: Payer: Medicare HMO

## 2023-03-19 ENCOUNTER — Ambulatory Visit: Payer: Self-pay | Admitting: General Surgery

## 2023-03-19 DIAGNOSIS — K912 Postsurgical malabsorption, not elsewhere classified: Secondary | ICD-10-CM | POA: Diagnosis not present

## 2023-03-19 DIAGNOSIS — E559 Vitamin D deficiency, unspecified: Secondary | ICD-10-CM | POA: Diagnosis not present

## 2023-03-19 DIAGNOSIS — E538 Deficiency of other specified B group vitamins: Secondary | ICD-10-CM | POA: Insufficient documentation

## 2023-03-19 DIAGNOSIS — Z7982 Long term (current) use of aspirin: Secondary | ICD-10-CM | POA: Insufficient documentation

## 2023-03-19 DIAGNOSIS — Z79624 Long term (current) use of inhibitors of nucleotide synthesis: Secondary | ICD-10-CM | POA: Insufficient documentation

## 2023-03-19 DIAGNOSIS — R1905 Periumbilic swelling, mass or lump: Secondary | ICD-10-CM | POA: Diagnosis not present

## 2023-03-19 DIAGNOSIS — D509 Iron deficiency anemia, unspecified: Secondary | ICD-10-CM | POA: Insufficient documentation

## 2023-03-19 DIAGNOSIS — D508 Other iron deficiency anemias: Secondary | ICD-10-CM

## 2023-03-19 DIAGNOSIS — Z9884 Bariatric surgery status: Secondary | ICD-10-CM | POA: Diagnosis not present

## 2023-03-19 DIAGNOSIS — Z79899 Other long term (current) drug therapy: Secondary | ICD-10-CM | POA: Diagnosis not present

## 2023-03-19 LAB — BASIC METABOLIC PANEL - CANCER CENTER ONLY
Anion gap: 9 (ref 5–15)
BUN: 15 mg/dL (ref 8–23)
CO2: 26 mmol/L (ref 22–32)
Calcium: 8.6 mg/dL — ABNORMAL LOW (ref 8.9–10.3)
Chloride: 102 mmol/L (ref 98–111)
Creatinine: 0.98 mg/dL (ref 0.44–1.00)
GFR, Estimated: >60 mL/min (ref >60)
Glucose, Bld: 100 mg/dL — ABNORMAL HIGH (ref 70–99)
Potassium: 4.5 mmol/L (ref 3.5–5.1)
Sodium: 137 mmol/L (ref 135–145)

## 2023-03-19 LAB — FERRITIN: Ferritin: 63 ng/mL (ref 11–307)

## 2023-03-19 LAB — CBC (CANCER CENTER ONLY)
HCT: 43.3 % (ref 36.0–46.0)
Hemoglobin: 14.4 g/dL (ref 12.0–15.0)
MCH: 30.7 pg (ref 26.0–34.0)
MCHC: 33.3 g/dL (ref 30.0–36.0)
MCV: 92.3 fL (ref 80.0–100.0)
Platelet Count: 177 10*3/uL (ref 150–400)
RBC: 4.69 MIL/uL (ref 3.87–5.11)
RDW: 12.1 % (ref 11.5–15.5)
WBC Count: 4.8 10*3/uL (ref 4.0–10.5)
nRBC: 0 % (ref 0.0–0.2)

## 2023-03-19 LAB — VITAMIN D 25 HYDROXY (VIT D DEFICIENCY, FRACTURES): Vit D, 25-Hydroxy: 75.65 ng/mL (ref 30–100)

## 2023-03-19 LAB — IRON AND TIBC
Iron: 72 ug/dL (ref 28–170)
Saturation Ratios: 20 % (ref 10.4–31.8)
TIBC: 360 ug/dL (ref 250–450)
UIBC: 288 ug/dL

## 2023-03-19 LAB — VITAMIN B12: Vitamin B-12: 583 pg/mL (ref 180–914)

## 2023-03-19 NOTE — H&P (View-Only) (Signed)
 History of Present Illness The patient presents with a chronic seroma of the abdominal wall.  The seroma is described as a 'hard knot' that has been increasing in size over the past few months. Initially localized, it now extends further up her abdomen. She experiences occasional sharp pain and a quivering sensation in the area, with discomfort exacerbated by pressure, such as when wearing pants.  No alleviating factors.  No pain radiation.  No changes in the skin overlying the lump or drainage have been observed.  I personally evaluated CT scan and MRI of the abdomen that have confirmed the presence of the seroma. There is no history of trauma to the area. Her past surgical history includes an abdominoplasty approximately eight or nine years ago and a back surgery in December 2003, after which she experienced difficulty with urination and defecation.  She plans to travel to [place] from March 19th to 25th and prefers any surgical intervention to be scheduled after her return.      PAST MEDICAL HISTORY:  Past Medical History:  Diagnosis Date   Anxiety    Bipolar 2 disorder (CMS/HHS-HCC)    Depression    Hyperlipidemia    Hypoglycemia         PAST SURGICAL HISTORY:   Past Surgical History:  Procedure Laterality Date   CESAREAN SECTION  07/27/1981   HYSTERECTOMY  1987   TAH-BSO   APPENDECTOMY  01/02/2007   neck surgery  2017   shoudler surgery  2019   back surgery     2007 - 2023 - has had 5   BARIATRIC SURGERY  12/2003   CHOLECYSTECTOMY  2006         MEDICATIONS:  Outpatient Encounter Medications as of 03/19/2023  Medication Sig Dispense Refill   aspirin 81 MG EC tablet Take 81 mg by mouth once daily     azelastine (ASTELIN) 137 mcg nasal spray Place 1 spray into both nostrils 2 (two) times daily     bisacodyL (DULCOLAX) 5 mg EC tablet Take 5 mg by mouth once daily as needed     butalbital-acetaminophen-caff 50-300-40 mg Cap butalbital-acetaminophen-caffeine 50 mg-300 mg-40 mg  capsule  TAKE 1 CAPSULE EVERY 6 HOURS AS NEEDED     cholecalciferol, vitamin D3, (VITAMIN D3) 125 mcg (5,000 unit) tablet Take 5,000 Units by mouth once daily     cyanocobalamin (VITAMIN B12) 1,000 mcg/mL injection Inject 1 mL (1,000 mcg total) into the muscle monthly 1 mL 11   diclofenac (VOLTAREN) 1 % topical gel as needed     estradioL (ESTRACE) 0.5 MG tablet Take 0.5 mg by mouth once daily     etodolac (LODINE) 500 MG tablet Take 500 mg by mouth 2 (two) times daily     ezetimibe (ZETIA) 10 mg tablet Take 10 mg by mouth once daily     fluticasone propionate (FLONASE) 50 mcg/actuation nasal spray fluticasone propionate 50 mcg/actuation nasal spray,suspension     FUROsemide (LASIX) 20 MG tablet as needed     gabapentin (NEURONTIN) 600 MG tablet Take 300 mg by mouth 4 (four) times daily     [START ON 04/02/2023] hydrOXYzine (VISTARIL) 50 MG capsule Take 50 mg by mouth 3 (three) times daily as needed     melatonin 10 mg Cap Take by mouth     omeprazole (PRILOSEC) 40 MG DR capsule Take 40 mg by mouth once daily     ondansetron (ZOFRAN-ODT) 4 MG disintegrating tablet TAKE 1 TABLET BY MOUTH EVERY 8 HOURS AS  NEEDED FOR NAUSEA AND VOMITING     OXcarbazepine (TRILEPTAL) 300 MG tablet Take 300 mg by mouth 2 (two) times daily     oxyCODONE-acetaminophen (PERCOCET) 7.5-325 mg tablet Take 1 tablet by mouth every 6 (six) hours as needed     rizatriptan (MAXALT-MLT) 10 MG disintegrating tablet Take 10 mg by mouth once as needed     rosuvastatin (CRESTOR) 40 MG tablet rosuvastatin 40 mg tablet     tiZANidine (ZANAFLEX) 4 MG tablet TAKE 1 TABLET BY MOUTH EVERY 6 HOURS     traMADoL (ULTRAM) 50 mg tablet Take 50 mg by mouth every 6 (six) hours     traZODone (DESYREL) 100 MG tablet Take 100 mg by mouth at bedtime     triamcinolone 0.5 % ointment Apply 1 Application topically as needed     venlafaxine (EFFEXOR-XR) 37.5 MG XR capsule Take 37.5 mg by mouth at bedtime     venlafaxine (EFFEXOR-XR) 75 MG XR capsule  Take 75 mg by mouth every morning     zoledronic acid (ZOMETA) 4 mg/100 mL IVPB Inject 4 mg into the vein yearly     ARIPiprazole (ABILIFY) 10 MG tablet Take 5 mg by mouth        aspirin-calcium carbonate 81 mg-300 mg calcium(777 mg) Tab TAKE 1 TABLET BY MOUTH EVERY DAY (Patient not taking: Reported on 03/19/2023)     biotin 10 mg Tab Take by mouth (Patient not taking: Reported on 03/19/2023)     Compound Medication Estriol 1 mg/ g Use 1 gram per vagina twice weekly as needed Disp 30 g tube with 1 refill 1 each 1   magnesium oxide 500 mg Cap TAKE 1 CAPSULE (500 MG TOTAL) BY MOUTH 2 (TWO) TIMES DAILY AT 8 AM AND 10 PM. (Patient not taking: Reported on 03/19/2023)     oxyCODONE (ROXICODONE) 5 MG immediate release tablet  (Patient not taking: Reported on 08/26/2020)     phentermine (ADIPEX-P) 37.5 mg tablet Take 1 tablet (37.5 mg total) by mouth every morning before breakfast (Patient not taking: Reported on 08/26/2020) 30 tablet 0   propranoloL (INDERAL) 10 MG tablet propranolol 10 mg tablet (Patient not taking: Reported on 03/19/2023)     vitamin E, dl,tocopheryl acet, (VITAMIN E, DL, ACETATE,) 161 unit capsule Take by mouth     No facility-administered encounter medications on file as of 03/19/2023.     ALLERGIES:   Metronidazole, Duloxetine hcl, and Adhesive tape-silicones   SOCIAL HISTORY:  Social History   Socioeconomic History   Marital status: Married  Tobacco Use   Smoking status: Never    Passive exposure: Never   Smokeless tobacco: Never   Tobacco comments:    Didn't like smoking  Vaping Use   Vaping status: Never Used  Substance and Sexual Activity   Alcohol use: Never   Drug use: Never   Sexual activity: Yes    Partners: Male    Birth control/protection: Surgical, None    Comment: Total hysterectomy  Other Topics Concern   Would you please tell us about the people who live in your home, your pets, or anything else important to your social life? No   Social Drivers of  Corporate investment banker Strain: Medium Risk (03/19/2023)   Overall Financial Resource Strain (CARDIA)    Difficulty of Paying Living Expenses: Somewhat hard  Food Insecurity: Food Insecurity Present (03/19/2023)   Hunger Vital Sign    Worried About Running Out of Food in the Last Year: Sometimes  true    Ran Out of Food in the Last Year: Sometimes true  Transportation Needs: No Transportation Needs (03/19/2023)   PRAPARE - Administrator, Civil Service (Medical): No    Lack of Transportation (Non-Medical): No    FAMILY HISTORY:  Family History  Problem Relation Name Age of Onset   Lung cancer Mother     Heart disease Father Jillyn Ledger    Myocardial Infarction (Heart attack) Father Jillyn Ledger    Heart disease Sister     Diabetes Sister     Heart disease Brother     Heart disease Brother     Diabetes Brother     Diabetes Maternal Grandfather       GENERAL REVIEW OF SYSTEMS:   General ROS: negative for - chills, fatigue, fever, weight gain or weight loss Allergy and Immunology ROS: negative for - hives  Hematological and Lymphatic ROS: negative for - bleeding problems or bruising, negative for palpable nodes Endocrine ROS: negative for - heat or cold intolerance, hair changes Respiratory ROS: negative for - cough, shortness of breath or wheezing Cardiovascular ROS: no chest pain or palpitations GI ROS: negative for nausea, vomiting, abdominal pain, diarrhea, constipation Musculoskeletal ROS: negative for - joint swelling or muscle pain Neurological ROS: negative for - confusion, syncope Dermatological ROS: negative for pruritus and rash  PHYSICAL EXAM:  Vitals:   03/19/23 1035  BP: 92/64  Pulse: 87  .  Ht:157.5 cm (5\' 2" ) Wt:66.7 kg (147 lb) ZOX:WRUE surface area is 1.71 meters squared. Body mass index is 26.89 kg/m.Marland Kitchen   GENERAL: Alert, active, oriented x3  HEENT: Pupils equal reactive to light. Extraocular movements are intact. Sclera clear.  Palpebral conjunctiva normal red color.Pharynx clear.  NECK: Supple with no palpable mass and no adenopathy.  LUNGS: Sound clear with no rales rhonchi or wheezes.  HEART: Regular rhythm S1 and S2 without murmur.  ABDOMEN: Soft and depressible, nontender but with palpable mass in left to the upper umbilical area.  Bedside ultrasound shows a possible gastric lesion but bedside aspiration unable to aspirate thick fluid.  EXTREMITIES: Well-developed well-nourished symmetrical with no dependent edema.  NEUROLOGICAL: Awake alert oriented, facial expression symmetrical, moving all extremities.   Results Procedure: Seroma Aspiration Description: A needle was inserted into the seroma. Minimal fluid was aspirated. The seroma cavity was identified but no significant fluid was obtained. Informed Consent: The patient was informed about the procedure, including the use of local anesthesia, the potential discomfort from the needle, and the possibility of the seroma reaccumulating. Alternatives such as surgical removal were discussed, and the patient consented to proceed with the aspiration.  RADIOLOGY Abdominal wall ultrasound: Seroma identified (03/19/2023) CT scan: Consistent with seroma MRI: Consistent with seroma    Assessment & Plan Chronic seroma/hematoma/mass of the abdominal wall   She presents with a chronic seroma/hematoma/mass of the abdominal wall, manifesting as a hard knot that has increased in size over several months at the site of a previous abdominoplasty performed 8-9 years ago. Ultrasound and prior imaging confirm the diagnosis. The etiology is unclear but may relate to past surgeries or trauma. The seroma causes discomfort and difficulty wearing pants. An aspiration attempt was unsuccessful, indicating a thick or organized collection. Options discussed include aspiration or surgical excision. Aspiration risks recurrence due to capsule reformation, while surgical excision involves  removing the entire cavity, reducing recurrence risk but not guaranteeing it. Plan to perform surgical excision after April 08, 2023, as a same-day surgery under  anesthesia. Advise minimal activity for a couple of days post-surgery to prevent trauma to the area.   Abdominal wall mass of periumbilical region [R19.05]          Patient verbalized understanding, all questions were answered, and were agreeable with the plan outlined above.   Carolan Shiver, MD  Electronically signed by Carolan Shiver, MD

## 2023-03-19 NOTE — H&P (Signed)
 History of Present Illness The patient presents with a chronic seroma of the abdominal wall.  The seroma is described as a 'hard knot' that has been increasing in size over the past few months. Initially localized, it now extends further up her abdomen. She experiences occasional sharp pain and a quivering sensation in the area, with discomfort exacerbated by pressure, such as when wearing pants.  No alleviating factors.  No pain radiation.  No changes in the skin overlying the lump or drainage have been observed.  I personally evaluated CT scan and MRI of the abdomen that have confirmed the presence of the seroma. There is no history of trauma to the area. Her past surgical history includes an abdominoplasty approximately eight or nine years ago and a back surgery in December 2003, after which she experienced difficulty with urination and defecation.  She plans to travel to [place] from March 19th to 25th and prefers any surgical intervention to be scheduled after her return.      PAST MEDICAL HISTORY:  Past Medical History:  Diagnosis Date   Anxiety    Bipolar 2 disorder (CMS/HHS-HCC)    Depression    Hyperlipidemia    Hypoglycemia         PAST SURGICAL HISTORY:   Past Surgical History:  Procedure Laterality Date   CESAREAN SECTION  07/27/1981   HYSTERECTOMY  1987   TAH-BSO   APPENDECTOMY  01/02/2007   neck surgery  2017   shoudler surgery  2019   back surgery     2007 - 2023 - has had 5   BARIATRIC SURGERY  12/2003   CHOLECYSTECTOMY  2006         MEDICATIONS:  Outpatient Encounter Medications as of 03/19/2023  Medication Sig Dispense Refill   aspirin 81 MG EC tablet Take 81 mg by mouth once daily     azelastine (ASTELIN) 137 mcg nasal spray Place 1 spray into both nostrils 2 (two) times daily     bisacodyL (DULCOLAX) 5 mg EC tablet Take 5 mg by mouth once daily as needed     butalbital-acetaminophen-caff 50-300-40 mg Cap butalbital-acetaminophen-caffeine 50 mg-300 mg-40 mg  capsule  TAKE 1 CAPSULE EVERY 6 HOURS AS NEEDED     cholecalciferol, vitamin D3, (VITAMIN D3) 125 mcg (5,000 unit) tablet Take 5,000 Units by mouth once daily     cyanocobalamin (VITAMIN B12) 1,000 mcg/mL injection Inject 1 mL (1,000 mcg total) into the muscle monthly 1 mL 11   diclofenac (VOLTAREN) 1 % topical gel as needed     estradioL (ESTRACE) 0.5 MG tablet Take 0.5 mg by mouth once daily     etodolac (LODINE) 500 MG tablet Take 500 mg by mouth 2 (two) times daily     ezetimibe (ZETIA) 10 mg tablet Take 10 mg by mouth once daily     fluticasone propionate (FLONASE) 50 mcg/actuation nasal spray fluticasone propionate 50 mcg/actuation nasal spray,suspension     FUROsemide (LASIX) 20 MG tablet as needed     gabapentin (NEURONTIN) 600 MG tablet Take 300 mg by mouth 4 (four) times daily     [START ON 04/02/2023] hydrOXYzine (VISTARIL) 50 MG capsule Take 50 mg by mouth 3 (three) times daily as needed     melatonin 10 mg Cap Take by mouth     omeprazole (PRILOSEC) 40 MG DR capsule Take 40 mg by mouth once daily     ondansetron (ZOFRAN-ODT) 4 MG disintegrating tablet TAKE 1 TABLET BY MOUTH EVERY 8 HOURS AS  NEEDED FOR NAUSEA AND VOMITING     OXcarbazepine (TRILEPTAL) 300 MG tablet Take 300 mg by mouth 2 (two) times daily     oxyCODONE-acetaminophen (PERCOCET) 7.5-325 mg tablet Take 1 tablet by mouth every 6 (six) hours as needed     rizatriptan (MAXALT-MLT) 10 MG disintegrating tablet Take 10 mg by mouth once as needed     rosuvastatin (CRESTOR) 40 MG tablet rosuvastatin 40 mg tablet     tiZANidine (ZANAFLEX) 4 MG tablet TAKE 1 TABLET BY MOUTH EVERY 6 HOURS     traMADoL (ULTRAM) 50 mg tablet Take 50 mg by mouth every 6 (six) hours     traZODone (DESYREL) 100 MG tablet Take 100 mg by mouth at bedtime     triamcinolone 0.5 % ointment Apply 1 Application topically as needed     venlafaxine (EFFEXOR-XR) 37.5 MG XR capsule Take 37.5 mg by mouth at bedtime     venlafaxine (EFFEXOR-XR) 75 MG XR capsule  Take 75 mg by mouth every morning     zoledronic acid (ZOMETA) 4 mg/100 mL IVPB Inject 4 mg into the vein yearly     ARIPiprazole (ABILIFY) 10 MG tablet Take 5 mg by mouth        aspirin-calcium carbonate 81 mg-300 mg calcium(777 mg) Tab TAKE 1 TABLET BY MOUTH EVERY DAY (Patient not taking: Reported on 03/19/2023)     biotin 10 mg Tab Take by mouth (Patient not taking: Reported on 03/19/2023)     Compound Medication Estriol 1 mg/ g Use 1 gram per vagina twice weekly as needed Disp 30 g tube with 1 refill 1 each 1   magnesium oxide 500 mg Cap TAKE 1 CAPSULE (500 MG TOTAL) BY MOUTH 2 (TWO) TIMES DAILY AT 8 AM AND 10 PM. (Patient not taking: Reported on 03/19/2023)     oxyCODONE (ROXICODONE) 5 MG immediate release tablet  (Patient not taking: Reported on 08/26/2020)     phentermine (ADIPEX-P) 37.5 mg tablet Take 1 tablet (37.5 mg total) by mouth every morning before breakfast (Patient not taking: Reported on 08/26/2020) 30 tablet 0   propranoloL (INDERAL) 10 MG tablet propranolol 10 mg tablet (Patient not taking: Reported on 03/19/2023)     vitamin E, dl,tocopheryl acet, (VITAMIN E, DL, ACETATE,) 161 unit capsule Take by mouth     No facility-administered encounter medications on file as of 03/19/2023.     ALLERGIES:   Metronidazole, Duloxetine hcl, and Adhesive tape-silicones   SOCIAL HISTORY:  Social History   Socioeconomic History   Marital status: Married  Tobacco Use   Smoking status: Never    Passive exposure: Never   Smokeless tobacco: Never   Tobacco comments:    Didn't like smoking  Vaping Use   Vaping status: Never Used  Substance and Sexual Activity   Alcohol use: Never   Drug use: Never   Sexual activity: Yes    Partners: Male    Birth control/protection: Surgical, None    Comment: Total hysterectomy  Other Topics Concern   Would you please tell us about the people who live in your home, your pets, or anything else important to your social life? No   Social Drivers of  Corporate investment banker Strain: Medium Risk (03/19/2023)   Overall Financial Resource Strain (CARDIA)    Difficulty of Paying Living Expenses: Somewhat hard  Food Insecurity: Food Insecurity Present (03/19/2023)   Hunger Vital Sign    Worried About Running Out of Food in the Last Year: Sometimes  true    Ran Out of Food in the Last Year: Sometimes true  Transportation Needs: No Transportation Needs (03/19/2023)   PRAPARE - Administrator, Civil Service (Medical): No    Lack of Transportation (Non-Medical): No    FAMILY HISTORY:  Family History  Problem Relation Name Age of Onset   Lung cancer Mother     Heart disease Father Jillyn Ledger    Myocardial Infarction (Heart attack) Father Jillyn Ledger    Heart disease Sister     Diabetes Sister     Heart disease Brother     Heart disease Brother     Diabetes Brother     Diabetes Maternal Grandfather       GENERAL REVIEW OF SYSTEMS:   General ROS: negative for - chills, fatigue, fever, weight gain or weight loss Allergy and Immunology ROS: negative for - hives  Hematological and Lymphatic ROS: negative for - bleeding problems or bruising, negative for palpable nodes Endocrine ROS: negative for - heat or cold intolerance, hair changes Respiratory ROS: negative for - cough, shortness of breath or wheezing Cardiovascular ROS: no chest pain or palpitations GI ROS: negative for nausea, vomiting, abdominal pain, diarrhea, constipation Musculoskeletal ROS: negative for - joint swelling or muscle pain Neurological ROS: negative for - confusion, syncope Dermatological ROS: negative for pruritus and rash  PHYSICAL EXAM:  Vitals:   03/19/23 1035  BP: 92/64  Pulse: 87  .  Ht:157.5 cm (5\' 2" ) Wt:66.7 kg (147 lb) ZOX:WRUE surface area is 1.71 meters squared. Body mass index is 26.89 kg/m.Marland Kitchen   GENERAL: Alert, active, oriented x3  HEENT: Pupils equal reactive to light. Extraocular movements are intact. Sclera clear.  Palpebral conjunctiva normal red color.Pharynx clear.  NECK: Supple with no palpable mass and no adenopathy.  LUNGS: Sound clear with no rales rhonchi or wheezes.  HEART: Regular rhythm S1 and S2 without murmur.  ABDOMEN: Soft and depressible, nontender but with palpable mass in left to the upper umbilical area.  Bedside ultrasound shows a possible gastric lesion but bedside aspiration unable to aspirate thick fluid.  EXTREMITIES: Well-developed well-nourished symmetrical with no dependent edema.  NEUROLOGICAL: Awake alert oriented, facial expression symmetrical, moving all extremities.   Results Procedure: Seroma Aspiration Description: A needle was inserted into the seroma. Minimal fluid was aspirated. The seroma cavity was identified but no significant fluid was obtained. Informed Consent: The patient was informed about the procedure, including the use of local anesthesia, the potential discomfort from the needle, and the possibility of the seroma reaccumulating. Alternatives such as surgical removal were discussed, and the patient consented to proceed with the aspiration.  RADIOLOGY Abdominal wall ultrasound: Seroma identified (03/19/2023) CT scan: Consistent with seroma MRI: Consistent with seroma    Assessment & Plan Chronic seroma/hematoma/mass of the abdominal wall   She presents with a chronic seroma/hematoma/mass of the abdominal wall, manifesting as a hard knot that has increased in size over several months at the site of a previous abdominoplasty performed 8-9 years ago. Ultrasound and prior imaging confirm the diagnosis. The etiology is unclear but may relate to past surgeries or trauma. The seroma causes discomfort and difficulty wearing pants. An aspiration attempt was unsuccessful, indicating a thick or organized collection. Options discussed include aspiration or surgical excision. Aspiration risks recurrence due to capsule reformation, while surgical excision involves  removing the entire cavity, reducing recurrence risk but not guaranteeing it. Plan to perform surgical excision after April 08, 2023, as a same-day surgery under  anesthesia. Advise minimal activity for a couple of days post-surgery to prevent trauma to the area.   Abdominal wall mass of periumbilical region [R19.05]          Patient verbalized understanding, all questions were answered, and were agreeable with the plan outlined above.   Carolan Shiver, MD  Electronically signed by Carolan Shiver, MD

## 2023-03-20 ENCOUNTER — Encounter: Payer: Self-pay | Admitting: Cardiovascular Disease

## 2023-03-20 ENCOUNTER — Encounter: Payer: Self-pay | Admitting: Internal Medicine

## 2023-03-20 ENCOUNTER — Inpatient Hospital Stay (HOSPITAL_BASED_OUTPATIENT_CLINIC_OR_DEPARTMENT_OTHER): Payer: Medicare HMO | Admitting: Internal Medicine

## 2023-03-20 ENCOUNTER — Inpatient Hospital Stay: Payer: Medicare HMO

## 2023-03-20 VITALS — BP 131/80 | HR 73 | Temp 96.7°F | Resp 18 | Wt 149.0 lb

## 2023-03-20 DIAGNOSIS — D509 Iron deficiency anemia, unspecified: Secondary | ICD-10-CM | POA: Diagnosis not present

## 2023-03-20 DIAGNOSIS — Z79899 Other long term (current) drug therapy: Secondary | ICD-10-CM | POA: Diagnosis not present

## 2023-03-20 DIAGNOSIS — Z7982 Long term (current) use of aspirin: Secondary | ICD-10-CM | POA: Diagnosis not present

## 2023-03-20 DIAGNOSIS — Z79624 Long term (current) use of inhibitors of nucleotide synthesis: Secondary | ICD-10-CM | POA: Diagnosis not present

## 2023-03-20 DIAGNOSIS — K912 Postsurgical malabsorption, not elsewhere classified: Secondary | ICD-10-CM | POA: Diagnosis not present

## 2023-03-20 DIAGNOSIS — Z9884 Bariatric surgery status: Secondary | ICD-10-CM | POA: Diagnosis not present

## 2023-03-20 DIAGNOSIS — D508 Other iron deficiency anemias: Secondary | ICD-10-CM | POA: Diagnosis not present

## 2023-03-20 DIAGNOSIS — E559 Vitamin D deficiency, unspecified: Secondary | ICD-10-CM | POA: Diagnosis not present

## 2023-03-20 DIAGNOSIS — E538 Deficiency of other specified B group vitamins: Secondary | ICD-10-CM | POA: Diagnosis not present

## 2023-03-20 NOTE — Assessment & Plan Note (Addendum)
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  Currently on maintenance IV venofer-  HOLD off venofer- I sat 20%.    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  #Vitamin D deficiency-malabsorption;on vitamin D 5000 units; FEB Vit D- 76 levels.  Stable  # Mild intermittent thrombocytopenia-178- normal.   # Knot located to left of belly button [since dec 2023; since back surgery]-CT scan SEP 2024-  3 x 1.8 cm fluid collection in the anterior abdominal wall to the left of the umbilicus, likely postoperative seroma. Otherwise no intra-abdominal mass-likely secondary to prior tummy tuck in 2017.  Given otherwise asymptomatic nature of the fluid collection would not recommend any aspiration.  Again unrelated to her more recently December 2023 back surgery.  Monitor clinically for now.  Personally discussed with radiology.- Dr.Cintron- surgical excision, which involves removing the entire cavity to reduce the risk of recurrence.   # Fatigue: labile- causing fatigue- ? Related to GLP-1 agonist [stopped tirzepatide]- Awaiting cardiology evaluation [Dr.Gollan]  # DISPOSITION:  # HOLD venofer #  follow up  in 6 months  MD; 2-3 days prior- labs- cbc/cmp; iron studies; ferritin; B12- Dr.B

## 2023-03-20 NOTE — Progress Notes (Signed)
 [facility] CONSULT NOTE  Patient Care Team: Smitty Cords, DO as PCP - General (Family Medicine) Earna Coder, MD as Consulting Physician (Oncology)  CHIEF COMPLAINTS/PURPOSE OF CONSULTATION: Iron deficiency   HEMATOLOGY HISTORY  # ANEMIA IRON DEFICIENCY GASTRIC BYPASS [2005 [place]] EGD-none; colonoscopy-2015- few polyps snared [[place]]; IV venofer x3; fall 2020/ PO B12 [intol to SL b12]    Latest Reference Range & Units 03/03/21 10:15  Iron 28 - 170 ug/dL 161  UIBC ug/dL 096  TIBC 045 - 409 ug/dL 811 (H)  Saturation Ratios 10.4 - 31.8 % 24  Ferritin 11 - 307 ng/mL 15  Vitamin B12 180 - 914 pg/mL 573  (H): Data is abnormally high  HISTORY OF PRESENTING ILLNESS: Patient ambulating-independently. Alone.   Ashley Fields 68 y.o.  female history of iron deficiency secondary to gastric bypass is here for follow-up.   Patient to go for surgery on April 08, 2023 to have a knot removed that is located on the front upper left part of her abdomen.  More recently patient noted to have waking up in the mornings with her BP being on the low side, like 70/50, and then recking it in the afternoon it will be on the high side, like 160/80. Patient stopped GLP1 agonist recently. Complains of mild to moderate fatigue.  No dizzy spells no falls.  Otherwise no blood in stools or black or stools.  Review of Systems  Constitutional:  Positive for malaise/fatigue. Negative for chills, diaphoresis, fever and weight loss.  HENT:  Negative for nosebleeds and sore throat.   Eyes:  Negative for double vision.  Respiratory:  Negative for cough, hemoptysis, sputum production and wheezing.   Cardiovascular:  Negative for chest pain, palpitations, orthopnea and leg swelling.  Gastrointestinal:  Negative for abdominal pain, blood in stool, constipation, diarrhea, heartburn, melena, nausea and vomiting.  Genitourinary:  Negative for dysuria, frequency and urgency.   Musculoskeletal:  Negative for back pain and joint pain.  Skin: Negative.  Negative for itching and rash.  Neurological:  Negative for dizziness, tingling, focal weakness, weakness and headaches.  Endo/Heme/Allergies:  Does not bruise/bleed easily.  Psychiatric/Behavioral:  Negative for depression. The patient is not nervous/anxious and does not have insomnia.     MEDICAL HISTORY:  Past Medical History:  Diagnosis Date   Allergy    Anemia    Anxiety    Arthritis    Yes   Back pain    Blood transfusion without reported diagnosis 1977   Transfusions from miscarriage & hemorrhaging   Chronic kidney disease    Depression    Disp fx of cuboid bone of right foot, init for clos fx 01/01/2017   Fibromyalgia    GERD (gastroesophageal reflux disease)    Headache    Hepatitis C    Hyperlipidemia    Lumbar radiculopathy    Neck pain    Neuromuscular disorder (HCC) 1997   Falling to much was an eye-opener   Osteoporosis    Thyroid disease    Urine incontinence     SURGICAL HISTORY: Past Surgical History:  Procedure Laterality Date   5 miscarriages     1977-1985, Blood transfusion s/p miscarriage 1977   ABDOMINAL EXPOSURE N/A 12/18/2021   Procedure: ABDOMINAL EXPOSURE;  Surgeon: Cephus Shelling, MD;  Location: [facility] OR;  Service: Vascular;  Laterality: N/A;   ABDOMINAL HYSTERECTOMY  1987   Total   ANTERIOR LUMBAR FUSION N/A 12/18/2021   Procedure: Anterior Lumbar Interbody Fusion  -  Lumbar Five-Sacral One;  Surgeon: Tia Alert, MD;  Location: [facility] OR;  Service: Neurosurgery;  Laterality: N/A;   APPENDECTOMY  12/2008   AUGMENTATION MAMMAPLASTY Bilateral 2015   Bilat   AUGMENTATION MAMMAPLASTY Bilateral 2018   implants redone w/ placement of implants under muscle   breast lift bilateral, implants  05/18/2015   bilateral, silicon naturel   BREAST REDUCTION SURGERY Bilateral 1997   CESAREAN SECTION  07/27/1981   Placenta Previa   CHOLECYSTECTOMY  03/2004   Lap surgery    COLONOSCOPY WITH PROPOFOL N/A 06/13/2020   Procedure: COLONOSCOPY WITH PROPOFOL;  Surgeon: Pasty Spillers, MD;  Location: [facility] ENDOSCOPY;  Service: Endoscopy;  Laterality: N/A;   COSMETIC SURGERY     Breast implants w/ lift, tummy tuck, upper & lower Blepharop   ESOPHAGOGASTRODUODENOSCOPY (EGD) WITH PROPOFOL N/A 06/13/2020   Procedure: ESOPHAGOGASTRODUODENOSCOPY (EGD) WITH PROPOFOL;  Surgeon: Pasty Spillers, MD;  Location: [facility] ENDOSCOPY;  Service: Endoscopy;  Laterality: N/A;   EYE SURGERY  2017   Cataract surgery & lasik   GASTRIC BYPASS  2005   Laparoscopic   IVC FILTER INSERTION  12/2003   TrapEase Vena Cava Filter   LUMBAR LAMINECTOMY  06/2006   L4-L5 (spinal fusion)   mini tummy tuck  05/07/2016   Bilateral bra/back roll lift skin removal   neck fusion     c3-c7 cadavier bones and metal plates placed per patient   neck surgery C3-C7  02/10/2015   ACDF   PROSTATE SURGERY     REDUCTION MAMMAPLASTY Bilateral 1997   SHOULDER ACROMIOPLASTY Left 03/27/2013   w/ labral debridement   SMALL INTESTINE SURGERY  12/24/2003   Lap Gastric Bypass   SPINAL CORD STIMULATOR IMPLANT  11/2010   removed 05/2011   SPINE SURGERY  2008 2017   Fusion L4-L5, ACDF C4-C7    SOCIAL HISTORY: Social History   Socioeconomic History   Marital status: Married    Spouse name: Not on file   Number of children: 1   Years of education: High School   Highest education level: GED or equivalent  Occupational History   Occupation: disability  Tobacco Use   Smoking status: Former    Current packs/day: 0.00    Average packs/day: 0.3 packs/day for 10.0 years (2.5 ttl pk-yrs)    Types: Cigarettes    Start date: 11/28/1985    Quit date: 11/29/1995    Years since quitting: 27.3    Passive exposure: Past   Smokeless tobacco: Former   Tobacco comments:    Quite 1997, didnt smoke hardly at all when I was smoking.  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]   Social Drivers of Health   Financial Resource Strain: Medium Risk (03/19/2023)   [facility]   Overall Financial Resource Strain (CARDIA)    Difficulty of Paying Living Expenses: Somewhat hard  Food Insecurity: Food Insecurity Present (03/19/2023)   Received from [facility]   Hunger Vital Sign    Worried About Running Out of Food in the Last Year: Sometimes true    Ran  Out of Food in the Last Year: Sometimes true  Transportation Needs: No Transportation Needs (03/19/2023)   Received from [facility] - Transportation    In the past 12 months, has lack of transportation kept you from medical appointments or from getting medications?: No    Lack of Transportation (Non-Medical): No  Physical Activity: Insufficiently Active (02/11/2023)   Exercise Vital Sign    Days of Exercise per Week: 4 days    Minutes of Exercise per Session: 20 min  Stress: Stress Concern Present (02/11/2023)   [employer] of Occupational Health - Occupational Stress Questionnaire    Feeling of Stress : To some extent  Social Connections: Unknown (02/11/2023)   Social Connection and Isolation Panel [NHANES]    Frequency of Communication with Friends and Family: More than three times a week    Frequency of Social Gatherings with Friends and Family: Three times a week    Attends Religious Services: Patient declined    Active Member of Clubs or Organizations: No    Attends Banker Meetings: Never    Marital Status: Married  Catering manager Violence: Not At Risk (07/20/2022)   Humiliation, Afraid, Rape, and  Kick questionnaire    Fear of Current or Ex-Partner: No    Emotionally Abused: No    Physically Abused: No    Sexually Abused: No    FAMILY HISTORY: Family History  Problem Relation Age of Onset   COPD Mother    Lung cancer Mother 69   Diabetes Mother    Heart disease Mother    Stroke Mother    Heart attack Mother    Arthritis Mother    Hypertension Mother    Obesity Mother    Varicose Veins Mother    Heart disease Father    Heart attack Father 3   Early death Father    Depression Sister        x 5 sisters   COPD Sister    Hypertension Sister    Colon polyps Sister    Hearing loss Sister    Heart attack Sister    Arthritis Sister    Varicose Veins Sister    COPD Sister    Obesity Sister    Depression Sister    Depression Sister    Heart disease Sister    Hypertension Sister    Hypertension Sister    Obesity Sister    Heart disease Brother        x 3 brothers   Diabetes Brother    Heart attack Brother    Arthritis Brother    Heart disease Brother    Hypertension Brother    Diabetes Brother    Heart disease Brother    Hypertension Brother    Obesity Brother    Varicose Veins Brother    Heart disease Brother    Hypertension Brother    Obesity Brother    Miscarriages / Stillbirths Maternal Aunt    Miscarriages / Stillbirths Paternal Aunt    Breast cancer Paternal Aunt        early years   Cancer Maternal Grandfather    Stroke Maternal Grandfather     ALLERGIES:  is allergic to flagyl [metronidazole], cymbalta [duloxetine hcl], and tape.  MEDICATIONS:  Current Outpatient Medications  Medication Sig Dispense Refill   aspirin EC (ASPIRIN LOW DOSE) 81 MG tablet TAKE 1 TABLET BY MOUTH EVERY DAY 90 tablet 0   azelastine (ASTELIN) 0.1 % nasal spray Place  2 sprays into both nostrils 2 (two) times daily. Use in each nostril as directed 30 mL 11   bisacodyl (DULCOLAX) 5 MG EC tablet Take 5 mg by mouth daily as needed for moderate constipation.      butalbital-acetaminophen-caffeine (FIORICET) 50-325-40 MG tablet Take 1 tablet by mouth 2 (two) times daily as needed for headache.     Cholecalciferol 250 MCG (10000 UT) CAPS Take 10,000 Units by mouth once a week.     cyanocobalamin (VITAMIN B12) 1000 MCG/ML injection INJECT 1 ML (1,000 MCG TOTAL) INTO THE MUSCLE EVERY 30 DAYS. 3 mL 3   diclofenac Sodium (VOLTAREN) 1 % GEL Apply 2 g topically 4 (four) times daily as needed (pain).     estradiol (ESTRACE) 1 MG tablet Take 1 tablet (1 mg total) by mouth daily. 90 tablet 1   etodolac (LODINE) 500 MG tablet Take 1 tablet by mouth 2 (two) times daily.     ezetimibe (ZETIA) 10 MG tablet TAKE 1 TABLET BY MOUTH EVERY DAY 90 tablet 3   fluticasone (FLONASE) 50 MCG/ACT nasal spray SPRAY 2 SPRAYS INTO EACH NOSTRIL EVERY DAY 48 mL 0   furosemide (LASIX) 20 MG tablet TAKE 1 TABLET ONE TIME DAILY AS NEEDED FOR FLUID OR EDEMA 90 tablet 1   gabapentin (NEURONTIN) 600 MG tablet Take 1 tablet (600 mg total) by mouth in the morning, at noon, in the evening, and at bedtime. 360 tablet 3   [START ON 04/02/2023] hydrOXYzine (VISTARIL) 50 MG capsule Take 1 capsule (50 mg total) by mouth 2 (two) times daily as needed for anxiety. 180 capsule 1   omeprazole (PRILOSEC) 40 MG capsule Take 1 capsule (40 mg total) by mouth daily. 90 capsule 3   ondansetron (ZOFRAN ODT) 4 MG disintegrating tablet Take 1 tablet (4 mg total) by mouth every 8 (eight) hours as needed for nausea or vomiting. 30 tablet 2   Oxcarbazepine (TRILEPTAL) 300 MG tablet TAKE 1 TABLET BY MOUTH 2 TIMES DAILY. 180 tablet 1   oxyCODONE-acetaminophen (PERCOCET) 7.5-325 MG tablet Take 1 tablet by mouth every 6 (six) hours as needed for severe pain.     rizatriptan (MAXALT-MLT) 10 MG disintegrating tablet Take 1 tablet (10 mg total) by mouth as needed for migraine. May repeat in 2 hours if needed, that is max dose for 24 hours. 10 tablet 3   rosuvastatin (CRESTOR) 40 MG tablet TAKE 1 TABLET BY MOUTH EVERY DAY 90  tablet 1   tiZANidine (ZANAFLEX) 4 MG tablet TAKE 1 TABLET BY MOUTH EVERY 6 HOURS 120 tablet 5   traZODone (DESYREL) 100 MG tablet Take 1-1.5 tablets (100-150 mg total) by mouth at bedtime as needed for sleep. 135 tablet 2   triamcinolone ointment (KENALOG) 0.5 % Apply 1 Application topically 2 (two) times daily. Apply to external ear for 2 weeks 30 g 2   valACYclovir (VALTREX) 1000 MG tablet TAKE 1 TABLET (1,000 MG TOTAL) BY MOUTH DAILY. FOR 5-7 DAYS, MAY REPEAT IF NEED FOR COLD SORE 90 tablet 1   venlafaxine XR (EFFEXOR-XR) 37.5 MG 24 hr capsule TAKE 1 CAPSULE (37.5 MG TOTAL) BY MOUTH DAILY WITH BREAKFAST. TAKE ALONG WITH 75 MG DAILY 90 capsule 1   venlafaxine XR (EFFEXOR-XR) 75 MG 24 hr capsule Take 1 capsule (75 mg total) by mouth daily with breakfast. 90 capsule 1   Zoledronic Acid (ZOMETA) 4 MG/100ML IVPB Once per year from Orthopedic 100 mL    No current facility-administered medications for this visit.  PHYSICAL EXAMINATION:   Vitals:   03/20/23 1306  BP: 131/80  Pulse: 73  Resp: 18  Temp: (!) 96.7 F (35.9 C)  SpO2: 98%   Filed Weights   03/20/23 1306  Weight: 149 lb (67.6 kg)   3 to 4 cm mass noted left of the umbilicus.  No significant tenderness.   Physical Exam HENT:     Head: Normocephalic and atraumatic.     Mouth/Throat:     Pharynx: No oropharyngeal exudate.  Eyes:     Pupils: Pupils are equal, round, and reactive to light.  Cardiovascular:     Rate and Rhythm: Normal rate and regular rhythm.  Pulmonary:     Effort: Pulmonary effort is normal. No respiratory distress.     Breath sounds: Normal breath sounds. No wheezing.  Abdominal:     General: Bowel sounds are normal. There is no distension.     Palpations: Abdomen is soft. There is no mass.     Tenderness: There is no abdominal tenderness. There is no guarding or rebound.  Musculoskeletal:        General: No tenderness. Normal range of motion.     Cervical back: Normal range of motion and neck  supple.  Skin:    General: Skin is warm.  Neurological:     Mental Status: She is alert and oriented to person, place, and time.  Psychiatric:        Mood and Affect: Affect normal.     LABORATORY DATA:  I have reviewed the data as listed Lab Results  Component Value Date   WBC 4.8 03/19/2023   HGB 14.4 03/19/2023   HCT 43.3 03/19/2023   MCV 92.3 03/19/2023   PLT 177 03/19/2023   Recent Labs    05/01/22 0851 09/17/22 1334 03/19/23 0955  NA 142 139 137  K 4.9 4.5 4.5  CL 103 103 102  CO2 32 29 26  GLUCOSE 86 91 100*  BUN 19 17 15   CREATININE 0.88 0.85 0.98  CALCIUM 9.7 8.8* 8.6*  GFRNONAA  --  >60 >60  PROT 7.2  --   --   AST 22  --   --   ALT 13  --   --   BILITOT 0.7  --   --      MR ABDOMEN WWO CONTRAST Result Date: 02/22/2023 CLINICAL DATA:  Follow-up possible abdominal wall seroma seen by prior CT, palpable abnormality EXAM: MRI ABDOMEN WITHOUT AND WITH CONTRAST TECHNIQUE: Multiplanar multisequence MR imaging of the abdomen was performed both before and after the administration of intravenous contrast. CONTRAST:  7mL GADAVIST GADOBUTROL 1 MMOL/ML IV SOLN COMPARISON:  CT abdomen pelvis, 09/28/2022 FINDINGS: Lower chest: No acute abnormality.  Bilateral breast implants. Hepatobiliary: No focal liver abnormality is seen. Status post cholecystectomy. No biliary dilatation. Pancreas: Unremarkable. No pancreatic ductal dilatation or surrounding inflammatory changes. Spleen: Normal in size without significant abnormality. Adrenals/Urinary Tract: Adrenal glands are unremarkable. Kidneys are normal, without renal calculi, solid lesion, or hydronephrosis. Stomach/Bowel: Stomach is within normal limits. No evidence of bowel wall thickening, distention, or inflammatory changes. Vascular/Lymphatic: Infrarenal IVC filter. No enlarged abdominal lymph nodes. Other: No abdominal wall hernia. Evidence of prior abdominoplasty. Unchanged hemorrhagic or proteinaceous fluid collection  overlying the left rectus musculature at the level of the umbilicus, measuring 2.9 x 1.7 cm (series 10, image 37). No ascites. Musculoskeletal: No acute or significant osseous findings. IMPRESSION: 1. Evidence of prior abdominoplasty. Unchanged hemorrhagic or proteinaceous fluid collection in the superficial  soft tissues of the ventral abdomen overlying the left rectus musculature at the level of the umbilicus, measuring 2.9 x 1.7 cm. This is consistent with a small chronic postoperative seroma. 2. Status post cholecystectomy. 3. Infrarenal IVC filter. Electronically Signed   By: Jearld Lesch M.D.   On: 02/22/2023 07:10    Acquired iron deficiency anemia due to decreased absorption #Iron deficiency-secondary to gastric malabsorption status post gastric bypass.  Currently on maintenance IV venofer-  HOLD off venofer- I sat 20%.    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  #Vitamin D deficiency-malabsorption;on vitamin D 5000 units; FEB Vit D- 76 levels.  Stable  # Mild intermittent thrombocytopenia-178- normal.   # Knot located to left of belly button [since dec 2023; since back surgery]-CT scan SEP 2024-  3 x 1.8 cm fluid collection in the anterior abdominal wall to the left of the umbilicus, likely postoperative seroma. Otherwise no intra-abdominal mass-likely secondary to prior tummy tuck in 2017.  Given otherwise asymptomatic nature of the fluid collection would not recommend any aspiration.  Again unrelated to her more recently December 2023 back surgery.  Monitor clinically for now.  Personally discussed with radiology.- Dr.Cintron- surgical excision, which involves removing the entire cavity to reduce the risk of recurrence.   # Fatigue: labile- causing fatigue- ? Related to GLP-1 agonist [stopped tirzepatide]- Awaiting cardiology evaluation [Dr.Gollan]  # DISPOSITION:  # HOLD venofer #  follow up  in 6 months   MD; 2-3 days prior- labs- cbc/cmp; iron studies; ferritin; B12- Dr.B     All questions were answered. The patient knows to call the clinic with any problems, questions or concerns.    Earna Coder, MD 03/20/2023 1:43 PM

## 2023-03-20 NOTE — Progress Notes (Signed)
 Patient to go for surgery on April 08, 2023 to have a knot removed that is located on the front upper left part of her abdomen. She has been waking up in the mornings with her BP being on the low side, like 70/50, and then recking it in the afternoon it will be on the high side, like 160/80, so I told her to reach out to her Cardiologist. She wakes up with her hand trembling and would like to see what could be causing that.

## 2023-03-27 ENCOUNTER — Telehealth: Payer: Self-pay | Admitting: Cardiovascular Disease

## 2023-03-27 ENCOUNTER — Other Ambulatory Visit: Payer: Self-pay | Admitting: Family Medicine

## 2023-03-27 DIAGNOSIS — Z90710 Acquired absence of both cervix and uterus: Secondary | ICD-10-CM

## 2023-03-27 NOTE — Telephone Encounter (Signed)
 Pt has appt 04/01/23 with Charlsie Quest, NP.   I will update all parties involved.

## 2023-03-27 NOTE — Telephone Encounter (Signed)
   Pre-operative Risk Assessment    Patient Name: Ashley Fields  DOB: May 16, 1955 MRN: [id number]   Date of last office visit: 02/05/22 Date of next office visit: 04/01/23   Request for Surgical Clearance    Procedure:  Excision of Abd mass  Date of Surgery:  Clearance 04/08/23                                Surgeon:  Dr. Greer Ee Group or Practice Name:  [facility] Surgical Clearance Phone number:  [phone] Fax number:  [phone]   Type of Clearance Requested:  Pharmacy, hold Aspirin 5days    Type of Anesthesia:  Not Indicated   Additional requests/questions:    Signed, Narda Amber   03/27/2023, 11:28 AM

## 2023-03-27 NOTE — Telephone Encounter (Signed)
    Primary Cardiologist:None  Chart reviewed as part of pre-operative protocol coverage. Because of Ashley Fields's past medical history and time since last visit, he/she will require a follow-up visit in order to better assess preoperative cardiovascular risk.  Pre-op covering staff: - Please schedule in office appointment and call patient to inform them. - Please contact requesting surgeon's office via preferred method (i.e, phone, fax) to inform them of need for appointment prior to surgery.  If applicable, this message will also be routed to pharmacy pool and/or primary cardiologist for input on holding anticoagulant/antiplatelet agent as requested below so that this information is available at time of patient's appointment.   Ronney Asters, NP  03/27/2023, 11:51 AM

## 2023-03-28 ENCOUNTER — Other Ambulatory Visit: Payer: Self-pay | Admitting: Family Medicine

## 2023-03-28 DIAGNOSIS — H6993 Unspecified Eustachian tube disorder, bilateral: Secondary | ICD-10-CM

## 2023-03-28 DIAGNOSIS — J329 Chronic sinusitis, unspecified: Secondary | ICD-10-CM

## 2023-03-28 NOTE — Telephone Encounter (Signed)
 Rx 10/31/22 #90 1RF- too soon Requested Prescriptions  Pending Prescriptions Disp Refills   estradiol (ESTRACE) 1 MG tablet [Pharmacy Med Name: ESTRADIOL 1 MG TABLET] 90 tablet 1    Sig: TAKE 1 TABLET BY MOUTH EVERY DAY     OB/GYN:  Estrogens Passed - 03/28/2023  8:24 AM      Passed - Mammogram is up-to-date per Health Maintenance      Passed - Last BP in normal range    BP Readings from Last 1 Encounters:  03/20/23 131/80         Passed - Valid encounter within last 12 months    Recent Outpatient Visits           4 months ago Chronic pain of left knee   [place] [facility] [place], Netta Neat, DO   10 months ago Ventral hernia without obstruction or gangrene   [place] [facility] Smitty Cords, DO   11 months ago Annual physical exam   [place] [facility] Smitty Cords, DO   1 year ago Eustachian tube dysfunction, bilateral   [place] [facility] [place], Netta Neat, DO   1 year ago Spinal stenosis of lumbar region without neurogenic claudication   [place] [facility] [place], Netta Neat, DO       Future Appointments             In 4 days Charlsie Quest, NP [facility] [place]   In 1 month Althea Charon, Netta Neat, DO [place] [facility], [facility]

## 2023-03-29 ENCOUNTER — Encounter
Admission: RE | Admit: 2023-03-29 | Discharge: 2023-03-29 | Disposition: A | Discharge status: Home / Self Care | Source: Ambulatory Visit | Attending: General Surgery | Admitting: General Surgery

## 2023-03-29 ENCOUNTER — Other Ambulatory Visit: Payer: Self-pay

## 2023-03-29 ENCOUNTER — Other Ambulatory Visit: Payer: Self-pay | Admitting: Family Medicine

## 2023-03-29 DIAGNOSIS — I25118 Atherosclerotic heart disease of native coronary artery with other forms of angina pectoris: Secondary | ICD-10-CM

## 2023-03-29 DIAGNOSIS — R0602 Shortness of breath: Secondary | ICD-10-CM

## 2023-03-29 DIAGNOSIS — I739 Peripheral vascular disease, unspecified: Secondary | ICD-10-CM

## 2023-03-29 DIAGNOSIS — Z90710 Acquired absence of both cervix and uterus: Secondary | ICD-10-CM

## 2023-03-29 HISTORY — DX: Other postprocedural complications and disorders of digestive system: K91.89

## 2023-03-29 NOTE — Telephone Encounter (Signed)
 Requested Prescriptions  Pending Prescriptions Disp Refills   Azelastine HCl 137 MCG/SPRAY SOLN [Pharmacy Med Name: AZELASTINE 0.1% (137 MCG) SPRY] 48 mL 0    Sig: PLACE 2 SPRAYS INTO BOTH NOSTRILS 2 (TWO) TIMES DAILY. USE IN EACH NOSTRIL AS DIRECTED     Ear, Nose, and Throat: Nasal Preparations - Antiallergy Passed - 03/29/2023  8:32 AM      Passed - Valid encounter within last 12 months    Recent Outpatient Visits           4 months ago Chronic pain of left knee   [place] [facility] [place], Netta Neat, DO   10 months ago Ventral hernia without obstruction or gangrene   [place] [facility] Smitty Cords, DO   11 months ago Annual physical exam   [place] [facility] Smitty Cords, DO   1 year ago Eustachian tube dysfunction, bilateral   [place] [facility] Smitty Cords, DO   1 year ago Spinal stenosis of lumbar region without neurogenic claudication   [place] [facility] [place], Netta Neat, DO       Future Appointments             In 3 days Charlsie Quest, NP [facility] [place]   In 1 month Althea Charon, Netta Neat, DO [place] [facility]

## 2023-03-29 NOTE — Patient Instructions (Addendum)
 Your procedure is scheduled on: 04/08/23 - Monday Report to the Registration Desk on the 1st floor of the Medical Mall. To find out your arrival time, please call [phone] between 1PM - 3PM on: 04/05/23 - Friday If your arrival time is 6:00 am, do not arrive before that time as the Medical Mall entrance doors do not open until 6:00 am.  REMEMBER: Instructions that are not followed completely may result in serious medical risk, up to and including death; or upon the discretion of your surgeon and anesthesiologist your surgery may need to be rescheduled.  Do not eat food or drink any liquids after midnight the night before surgery.  No gum chewing or hard candies.  One week prior to surgery: Stop Anti-inflammatories (NSAIDS) such asdiclofenac Sodium (VOLTAREN) , etodolac (LODINE) ,Advil, Aleve, Ibuprofen, Motrin, Naproxen, Naprosyn and Aspirin based products such as Excedrin, Goody's Powder, BC Powder.   You may take Tylenol if needed for pain up until the day of surgery.  Stop ANY OVER THE COUNTER supplements until after surgery.  HOLD aspirin EC (ASPIRIN LOW DOSE) beginning 04/04/23.  ON THE DAY OF SURGERY ONLY TAKE THESE MEDICATIONS WITH SIPS OF WATER:  Azelastine  estradiol (ESTRACE)  ezetimibe (ZETIA)  gabapentin (NEURONTIN)  omeprazole (PRILOSEC)  oxyCODONE-acetaminophen if needed Oxcarbazepine (TRILEPTAL)  tiZANidine (ZANAFLEX)  venlafaxine XR (EFFEXOR-XR)    No Alcohol for 24 hours before or after surgery.  No Smoking including e-cigarettes for 24 hours before surgery.  No chewable tobacco products for at least 6 hours before surgery.  No nicotine patches on the day of surgery.  Do not use any "recreational" drugs for at least a week (preferably 2 weeks) before your surgery.  Please be advised that the combination of cocaine and anesthesia may have negative outcomes, up to and including death. If you test positive for cocaine, your surgery will be cancelled.  On  the morning of surgery brush your teeth with toothpaste and water, you may rinse your mouth with mouthwash if you wish. Do not swallow any toothpaste or mouthwash.  Use CHG Soap or wipes as directed on instruction sheet.  Do not wear jewelry, make-up, hairpins, clips or nail polish.  For welded (permanent) jewelry: bracelets, anklets, waist bands, etc.  Please have this removed prior to surgery.  If it is not removed, there is a chance that hospital personnel will need to cut it off on the day of surgery.  Do not wear lotions, powders, or perfumes.   Do not shave body hair from the neck down 48 hours before surgery.  Contact lenses, hearing aids and dentures may not be worn into surgery.  Do not bring valuables to the hospital. [facility] is not responsible for any missing/lost belongings or valuables.   Notify your doctor if there is any change in your medical condition (cold, fever, infection).  Wear comfortable clothing (specific to your surgery type) to the hospital.  After surgery, you can help prevent lung complications by doing breathing exercises.  Take deep breaths and cough every 1-2 hours. Your doctor may order a device called an Incentive Spirometer to help you take deep breaths.  When coughing or sneezing, hold a pillow firmly against your incision with both hands. This is called "splinting." Doing this helps protect your incision. It also decreases belly discomfort.  If you are being admitted to the hospital overnight, leave your suitcase in the car. After surgery it may be brought to your room.  In case of increased patient  census, it may be necessary for you, the patient, to continue your postoperative care in the Same Day Surgery department.  If you are being discharged the day of surgery, you will not be allowed to drive home. You will need a responsible individual to drive you home and stay with you for 24 hours after surgery.   If you are taking public  transportation, you will need to have a responsible individual with you.  Please call the Pre-admissions Testing Dept. at [phone] if you have any questions about these instructions.  Surgery Visitation Policy:  Patients having surgery or a procedure may have two visitors.  Children under the age of 19 must have an adult with them who is not the patient.  Temporary Visitor Restrictions Due to increasing cases of flu, RSV and COVID-19: Children ages 12 and under will not be able to visit patients in [facility] hospitals under most circumstances.  Inpatient Visitation:    Visiting hours are 7 a.m. to 8 p.m. Up to four visitors are allowed at one time in a patient room. The visitors may rotate out with other people during the day.  One visitor age 41 or older may stay with the patient overnight and must be in the room by 8 p.m.    Preparing for Surgery with CHLORHEXIDINE GLUCONATE (CHG) Soap  Chlorhexidine Gluconate (CHG) Soap  o An antiseptic cleaner that kills germs and bonds with the skin to continue killing germs even after washing  o Used for showering the night before surgery and morning of surgery  Before surgery, you can play an important role by reducing the number of germs on your skin.  CHG (Chlorhexidine gluconate) soap is an antiseptic cleanser which kills germs and bonds with the skin to continue killing germs even after washing.  Please do not use if you have an allergy to CHG or antibacterial soaps. If your skin becomes reddened/irritated stop using the CHG.  1. Shower the NIGHT BEFORE SURGERY and the MORNING OF SURGERY with CHG soap.  2. If you choose to wash your hair, wash your hair first as usual with your normal shampoo.  3. After shampooing, rinse your hair and body thoroughly to remove the shampoo.  4. Use CHG as you would any other liquid soap. You can apply CHG directly to the skin and wash gently with a scrungie or a clean washcloth.  5. Apply  the CHG soap to your body only from the neck down. Do not use on open wounds or open sores. Avoid contact with your eyes, ears, mouth, and genitals (private parts). Wash face and genitals (private parts) with your normal soap.  6. Wash thoroughly, paying special attention to the area where your surgery will be performed.  7. Thoroughly rinse your body with warm water.  8. Do not shower/wash with your normal soap after using and rinsing off the CHG soap.  9. Pat yourself dry with a clean towel.  10. Wear clean pajamas to bed the night before surgery.  12. Place clean sheets on your bed the night of your first shower and do not sleep with pets.  13. Shower again with the CHG soap on the day of surgery prior to arriving at the hospital.  14. Do not apply any deodorants/lotions/powders.  15. Please wear clean clothes to the hospital.

## 2023-04-01 ENCOUNTER — Ambulatory Visit: Attending: Cardiology | Admitting: Cardiology

## 2023-04-01 ENCOUNTER — Encounter: Payer: Self-pay | Admitting: Cardiology

## 2023-04-01 VITALS — BP 126/70 | HR 67 | Ht 62.0 in | Wt 149.8 lb

## 2023-04-01 DIAGNOSIS — R011 Cardiac murmur, unspecified: Secondary | ICD-10-CM | POA: Diagnosis not present

## 2023-04-01 DIAGNOSIS — Z01810 Encounter for preprocedural cardiovascular examination: Secondary | ICD-10-CM

## 2023-04-01 DIAGNOSIS — I2510 Atherosclerotic heart disease of native coronary artery without angina pectoris: Secondary | ICD-10-CM

## 2023-04-01 DIAGNOSIS — R002 Palpitations: Secondary | ICD-10-CM

## 2023-04-01 DIAGNOSIS — E782 Mixed hyperlipidemia: Secondary | ICD-10-CM

## 2023-04-01 NOTE — Progress Notes (Signed)
 Cardiology Office Note:  .   Date:  04/01/2023  ID:  Ashley Fields, DOB May 27, 1955, MRN [id number] PCP: Smitty Cords, DO  [facility] Providers Cardiologist:  None    History of Present Illness: .   Ashley Fields is a 68 y.o. female with a past medical history of coronary calcification on CT, aortic atherosclerosis, bipolar disorder, prior tobacco abuse, hyperlipidemia, gastric bypass, Hollenhorst plaque (right eye), mild carotid artery disease, arthritis, depression, GERD, hepatitis C, who presents today for follow-up.  Prior history of coronary calcification on CT scan with coronary calcium score 2456, 99 percentile for age and sex matched control, negative stress test in 2019 and August 2022.  She was last seen in clinic 02/05/2022 where she had a fusion and fracture and S2 with continued back pain.  She was treated conservatively for the fracture.  She was continued on aspirin 81 mg daily ezetimibe, Crestor, and propranolol.  She was scheduled for an updated echocardiogram and be placed on a heart monitor.  She returns to clinic today stating that she has noted that she has been having labile blood pressures.  States her blood pressure has been relatively low in the morning when she wakes up and is very high in the afternoon.  She denies any chest pain, shortness of breath, but does have occasional peripheral edema.  She notes that with bouts of low blood pressure she has had dizziness and lightheadedness but denies any syncope or near syncopal episodes.  She denies any visual changes as well.  She states that she does have upcoming surgery and has concerns due to prior elevated coronary calcium scoring.  He stated that with the fluctuations in her blood pressure she was advised that she needed to have cardiovascular follow-up prior to surgical procedure.  She states that she has been compliant with her current medication regimen without any adverse effects.  She denies any  hospitalizations or visits to the emergency department.  She does note that with a recent diagnosis of her husband's cancer she has been under an increased amount of stress lately.  ROS: 10 point review of system has been reviewed and considered negative with exception was been listed in the HPI  Studies Reviewed: Marland Kitchen   EKG Interpretation Date/Time:  Monday April 01 2023 10:20:02 EDT Ventricular Rate:  67 PR Interval:  208 QRS Duration:  96 QT Interval:  390 QTC Calculation: 412 R Axis:   46  Text Interpretation: Normal sinus rhythm Normal ECG When compared with ECG of 11-Mar-2021 23:09, PREVIOUS ECG IS PRESENT Confirmed by Charlsie Quest ([id number]) on 04/01/2023 10:27:13 AM    Event Monitor (Zio) 03/09/2022 Conclusion Occasional paroxysmal SVT. No significant sustained arrhythmias. Paroxysmal SVT coincided with patient triggered events.  2D echo 02/28/2022 1. Left ventricular ejection fraction, by estimation, is 60 to 65%. The  left ventricle has normal function. The left ventricle has no regional  wall motion abnormalities. There is mild left ventricular hypertrophy.  Left ventricular diastolic function  could not be evaluated.   2. Right ventricular systolic function is normal. The right ventricular  size is normal.   3. The mitral valve is normal in structure. No evidence of mitral valve  regurgitation.   4. The aortic valve was not well visualized. Aortic valve regurgitation  is not visualized.   5. The inferior vena cava is normal in size with greater than 50%  respiratory variability, suggesting right atrial pressure of 3 mmHg.   Myoview Demetrius Revel 08/2020 Narrative &  Impression  There was no ST segment deviation noted during stress. No T wave inversion was noted during stress. Defect 1: There is a small defect of mild severity present in the apex location likely artifact. The study is normal. This is a low risk study. The left ventricular ejection fraction is normal  (55-65%). CT attenuation images shows moderate aortic and coronary calcifications.     Myoview lexiscan 2019 Narrative & Impression  Pharmacological myocardial perfusion imaging study with no significant  ischemia Normal wall motion, EF estimated at 83% No EKG changes concerning for ischemia at peak stress or in recovery. Low risk scan    Cardiac CT scoring 2019 IMPRESSION: Coronary calcium score of 2456 . This was 32 th percentile for age and sex matched control.   Consider f/u heart catheterization or perfusion study given how extensive the coronary calcification is Risk Assessment/Calculations:             Physical Exam:   VS:  BP 126/70   Pulse 67   Ht 5\' 2"  (1.575 m)   Wt 149 lb 12.8 oz (67.9 kg)   SpO2 96%   BMI 27.40 kg/m    Wt Readings from Last 3 Encounters:  04/01/23 149 lb 12.8 oz (67.9 kg)  03/20/23 149 lb (67.6 kg)  03/14/23 147 lb (66.7 kg)    GEN: Well nourished, well developed in no acute distress NECK: No JVD; No carotid bruits CARDIAC: RRR, II/VI systolic murmur RUSB, without rubs or gallops RESPIRATORY:  Clear to auscultation without rales, wheezing or rhonchi  ABDOMEN: Soft, non-tender, non-distended EXTREMITIES: Trace pretibial edema; No deformity   ASSESSMENT AND PLAN: .   Nonobstructive coronary artery disease with prior calcium score of 2456 which was the 99th percentile for age and sex matched control.  Negative Myoview Lexiscan in 2019 and 2022.  EKG today reveals sinus rhythm with no ischemic changes.  She is continued on aspirin 81 mg daily, ezetimibe 10 mg daily and rosuvastatin 40 mg daily.  With her upcoming surgery and issues with her blood pressure and increase amount of stress she has been scheduled for a cardiac PET stress.  Hyperlipidemia with an LDL of 43 in 2024.  She is continued on ezetimibe and rosuvastatin.  Heart murmur noted on exam with recent echocardiogram revealing an LVEF of 60 to 65%, no RWMA, mild left ventricular  hypertrophy, right ventricular function and size was normal, and no valvular abnormalities.   Preoperative cardiovascular examination this patient states that she has a mass in her abdomen about having to be removed.  Surgical clearance did not offer at the time of anesthesia that she is having.  According to ACC/AHA guidelines there is no further cardiovascular testing that is needed and the patient may proceed to surgery at acceptable risk.    Ms. Ceballos perioperative risk of a major cardiac event is 0.9% according to the Revised Cardiac Risk Index (RCRI).  Therefore, she is at high risk for perioperative complications.   Her functional capacity is good at 6.27 METs according to the Duke Activity Status Index (DASI). Recommendations: According to ACC/AHA guidelines, no further cardiovascular testing needed.  The patient may proceed to surgery at acceptable risk.      Informed Consent   Shared Decision Making/Informed Consent The risks [chest pain, shortness of breath, cardiac arrhythmias, dizziness, blood pressure fluctuations, myocardial infarction, stroke/transient ischemic attack, nausea, vomiting, allergic reaction, radiation exposure, metallic taste sensation and life-threatening complications (estimated to be 1 in 10,000)], benefits (risk stratification,  diagnosing coronary artery disease, treatment guidance) and alternatives of a cardiac PET stress test were discussed in detail with Ms. Bernales and she agrees to proceed.     Dispo: Patient return to clinic to see MD/APP in 6 weeks or sooner if needed for reevaluation of symptoms.  Signed, Abanoub Hanken, NP

## 2023-04-01 NOTE — Patient Instructions (Signed)
 Medication Instructions:  Your physician recommends that you continue on your current medications as directed. Please refer to the Current Medication list given to you today.  *If you need a refill on your cardiac medications before your next appointment, please call your pharmacy*   Lab Work: No labs ordered today  If you have labs (blood work) drawn today and your tests are completely normal, you will receive your results only by: MyChart Message (if you have MyChart) OR A paper copy in the mail If you have any lab test that is abnormal or we need to change your treatment, we will call you to review the results.   Testing/Procedures:    Please report to Radiology at the [facility] Main Entrance 30 minutes early for your test.  [address] [place], [place] [address]                         OR   Please report to Radiology at [facility] Main Entrance, medical mall, 30 mins prior to your test.  [address]  [place], [place]  How to Prepare for Your Cardiac PET/CT Stress Test:  Nothing to eat or drink, except water, 3 hours prior to arrival time.  NO caffeine/decaffeinated products, or chocolate 12 hours prior to arrival. (Please note decaffeinated beverages (teas/coffees) still contain caffeine).  If you have caffeine within 12 hours prior, the test will need to be rescheduled.  Medication instructions: Do not take nitrates (isosorbide mononitrate, Ranexa) the day before or day of test Do not take tamsulosin the day before or morning of test Hold theophylline containing medications for 12 hours. Hold Dipyridamole 48 hours prior to the test.  Diabetic Preparation: If able to eat breakfast prior to 3 hour fasting, you may take all medications, including your insulin. Do not worry if you miss your breakfast dose of insulin - start at your next meal. If you do not eat prior to 3 hour fast-Hold all diabetes (oral and insulin)  medications. Patients who wear a continuous glucose monitor MUST remove the device prior to scanning.  You may take your remaining medications with water.  NO perfume, cologne or lotion on chest or abdomen area. FEMALES - Please avoid wearing dresses to this appointment.  Total time is 1 to 2 hours; you may want to bring reading material for the waiting time.  IF YOU THINK YOU MAY BE PREGNANT, OR ARE NURSING PLEASE INFORM THE TECHNOLOGIST.  In preparation for your appointment, medication and supplies will be purchased.  Appointment availability is limited, so if you need to cancel or reschedule, please call the Radiology Department Scheduler at [phone] 24 hours in advance to avoid a cancellation fee of $100.00  What to Expect When you Arrive:  Once you arrive and check in for your appointment, you will be taken to a preparation room within the Radiology Department.  A technologist or Nurse will obtain your medical history, verify that you are correctly prepped for the exam, and explain the procedure.  Afterwards, an IV will be started in your arm and electrodes will be placed on your skin for EKG monitoring during the stress portion of the exam. Then you will be escorted to the PET/CT scanner.  There, staff will get you positioned on the scanner and obtain a blood pressure and EKG.  During the exam, you will continue to be connected to the EKG and blood pressure machines.  A  small, safe amount of a radioactive tracer will be injected in your IV to obtain a series of pictures of your heart along with an injection of a stress agent.    After your Exam:  It is recommended that you eat a meal and drink a caffeinated beverage to counter act any effects of the stress agent.  Drink plenty of fluids for the remainder of the day and urinate frequently for the first couple of hours after the exam.  Your doctor will inform you of your test results within 7-10 business days.  For more information and  frequently asked questions, please visit our website: [email or web address]  For questions about your test or how to prepare for your test, please call: Cardiac Imaging Nurse Navigators Office: [phone]    Follow-Up: At [facility], you and your health needs are our priority.  As part of our continuing mission to provide you with exceptional heart care, we have created designated Provider Care Teams.  These Care Teams include your primary Cardiologist (physician) and Advanced Practice Providers (APPs -  Physician Assistants and Nurse Practitioners) who all work together to provide you with the care you need, when you need it.    Your next appointment:   6 week(s)  Provider:   You may see Charlsie Quest, NP or one of the following Advanced Practice Providers on your designated Care Team:   Nicolasa Ducking, NP Eula Listen, PA-C Cadence Fransico Michael, PA-C Charlsie Quest, NP Carlos Levering, NP

## 2023-04-01 NOTE — Telephone Encounter (Signed)
 Requested Prescriptions  Pending Prescriptions Disp Refills   estradiol (ESTRACE) 1 MG tablet [Pharmacy Med Name: ESTRADIOL 1 MG TABLET] 90 tablet 0    Sig: TAKE 1 TABLET BY MOUTH EVERY DAY     OB/GYN:  Estrogens Passed - 04/01/2023  8:58 AM      Passed - Mammogram is up-to-date per Health Maintenance      Passed - Last BP in normal range    BP Readings from Last 1 Encounters:  03/20/23 131/80         Passed - Valid encounter within last 12 months    Recent Outpatient Visits           5 months ago Chronic pain of left knee   [place] [place], Netta Neat, DO   10 months ago Ventral hernia without obstruction or gangrene   [place] [facility] Smitty Cords, DO   11 months ago Annual physical exam   [place] [facility] Smitty Cords, DO   1 year ago Eustachian tube dysfunction, bilateral   [place] [facility] [place], Netta Neat, DO   1 year ago Spinal stenosis of lumbar region without neurogenic claudication   [place] [facility] [place], Netta Neat, DO       Future Appointments             Today Charlsie Quest, NP [facility]   In 1 month Althea Charon, Netta Neat, DO [facility]

## 2023-04-02 ENCOUNTER — Encounter: Payer: Self-pay | Admitting: General Surgery

## 2023-04-02 ENCOUNTER — Inpatient Hospital Stay: Admission: RE | Admit: 2023-04-02 | Source: Ambulatory Visit

## 2023-04-02 NOTE — Progress Notes (Addendum)
 Perioperative / Anesthesia Services  Pre-Admission Testing Clinical Review / Pre-Operative Anesthesia Consult  Date: 04/02/23  Patient Demographics:  Name: Ashley Fields DOB: [birth year] MRN:   [id number]  Planned Surgical Procedure(s):    Case: [id number] Date/Time: 04/08/23 0715   Procedure: EXCISION, NEOPLASM, ABDOMINAL WALL (Abdomen)   Anesthesia type: General   Pre-op diagnosis: R19.05 Abdominal wall massof periumbilical region   Location: [facility] OR ROOM 08 / [facility]   Surgeons: Carolan Shiver, MD      NOTE: Available PAT nursing documentation and vital signs have been reviewed. Clinical nursing staff has updated patient's PMH/PSHx, current medication list, and drug allergies/intolerances to ensure comprehensive history available to assist in medical decision making as it pertains to the aforementioned surgical procedure and anticipated anesthetic course. Extensive review of available clinical information personally performed. [place] [facility] and PSHx updated with any diagnoses/procedures that  may have been inadvertently omitted during his intake with the pre-admission testing department's nursing staff.  Clinical Discussion:  Ashley Fields is a 68 y.o. female who is submitted for pre-surgical anesthesia review and clearance prior to her undergoing the above procedure. Patient is a Former Smoker (2.5 pack years; quit 11/1995). Pertinent PMH includes: CAD, PSVT, aortic atherosclerosis, BILATERAL carotid artery disease, cardiac murmur, HTN, HLD, thyroid disease, CKD, GERD (on daily PPI), IDA, abdominal wall seroma, OA, fibromyalgia, chronic back pain, cervical DDD, insomnia, depression, anxiety, bipolar disorder.  Patient is followed by cardiology Mariah Milling, MD). She was last seen in the cardiology clinic on 04/01/2023; notes reviewed. At the time of her clinic visit, patient doing well overall from a cardiovascular perspective. She reported labile blood pressures that  tend to run lower in the morning. Due to her lower blood pressures, patient was experiencing episodes lightheadedness associated with position changes. Patient also with episodes of peripheral edema that were stable and at baseline overall. Patient denied any chest pain, shortness of breath, PND, orthopnea, palpitations, weakness, fatigue,or presyncope/syncope. Patient with a past medical history significant for cardiovascular diagnoses. Documented physical exam was grossly benign, providing no evidence of acute exacerbation and/or decompensation of the patient's known cardiovascular conditions.  Patient with history of BILATERAL carotid artery disease.  Most recent carotid Doppler was performed on 05/23/2017 revealing a 1-39% stenosis of the BILATERAL carotid arteries.  Vertebrals demonstrated antegrade flow.  There was normal flow hemodynamics in the BILATERAL subclavian arteries.  Coronary CTA was performed on 06/06/2017 that demonstrated an Agatston coronary artery calcium score of 2456. This placed patient in the 99th percentile for age, sex, and race matched controls.  Three-vessel calcium depositions observed.  Thoracic aorta also noted to have atherosclerotic changes.  Study demonstrates normal coronary origin with RIGHT dominance.  Most recent myocardial perfusion imaging study was performed on 08/08/2020 revealing a normal left ventricular systolic function with an EF of 55-65%.  There were no regional wall motion abnormalities.  No artifact or left ventricular cavity size enlargement appreciated on review of imaging. SPECT images demonstrated a small perfusion defect of mild severity present in the apex location.  Finding felt to likely represent artifact.  CT attenuation correction images showed moderate aortic and coronary calcifications.  TID ratio = 1.03. Study determined to be normal and low risk.  Most recent TTE performed on 02/28/2022 revealed a normal left ventricular systolic function  with an EF of 60-65%. There was mild LVH.  There were no regional wall motion abnormalities.  Left ventricular diastolic Doppler parameters were normal. Right ventricular size and function normal.  There was no significant valvular regurgitation. All transvalvular gradients were noted to be normal providing no evidence suggestive of valvular stenosis. Aorta normal in size with no evidence of ectasia or aneurysmal dilatation.  Long-term cardiac event monitor study performed on 03/09/2022 showed a predominant underlying sinus rhythm at an average rate of 75 bpm; range 48-193 bpm.  There were 8 runs of PSVT with the fastest lasting 6 beats at a maximum rate of 193, and the longest lasting 13 beats at an average rate of 112 bpm.  Atrial and ventricular ectopy was noted.  There were no sustained arrhythmias or prolonged pauses noted.  PSVT coincided with patient triggered events.  Blood pressure well controlled at 126/70 mmHg on currently prescribed diuretic (furosemide) monotherapy. Patient is on rosuvastatin + ezetimibe for her HLD diagnosis and ASCVD prevention. Patient is not diabetic. She does not have an OSAH diagnosis. Patient is able to complete all of her  ADL/IADLs without cardiovascular limitation.  Per the DASI, patient is able to achieve in excess of 4 METS of physical activity without experiencing any significant degree of angina/anginal equivalent symptoms.  Given patient's blood pressure lability, increased stress, and extremity swelling, she was scheduled for a cardiac PET stress test.  No changes were made to her medication regimen during her visit with cardiology.  Patient scheduled to follow-up with outpatient cardiology in 6 weeks or sooner if needed.  Ashley Fields is scheduled for an elective EXCISION, NEOPLASM, ABDOMINAL WALL (Abdomen) on 04/08/2023 with Dr. Carolan Shiver, MD. Given patient's past medical history significant for cardiovascular diagnoses, presurgical cardiac clearance was  sought by the PAT team. Per cardiology, "Ms. Hullum's perioperative risk of a major cardiac event is 0.9% according to the Revised Cardiac Risk Index (RCRI).  Therefore, she is at high risk for perioperative complications.   Her functional capacity is good at 6.27 METs according to the [place] Activity Status Index (DASI). According to ACC/AHA guidelines, no further cardiovascular testing needed.  The patient may proceed to surgery at ACCEPTABLE risk".    In review of the patient's chart, it is noted that she is on daily oral antithrombotic therapy. She has been instructed on recommendations for holding her low-dose ASA for 5 days prior to her procedure with plans to restart as soon as postoperative bleeding risk felt to be minimized by his attending surgeon. The patient has been instructed that her last dose of ASA should be on 04/02/2023.  Patient denies previous perioperative complications with anesthesia in the past. In review her EMR, it is noted that patient underwent a general anesthetic course at [facility] ([facility]) in 12/2021 without documented complications.      04/01/2023   10:15 AM 03/20/2023    1:06 PM 03/14/2023   11:40 AM  Vitals with BMI  Height 5\' 2"   5\' 2"   Weight 149 lbs 13 oz 149 lbs 147 lbs  BMI 27.39 27.25 26.88  Systolic 126 131 132  Diastolic 70 80 72  Pulse 67 73 80   Providers/Specialists:  NOTE: Primary physician provider listed below. Patient may have been seen by APP or partner within same practice.   PROVIDER ROLE / SPECIALTY LAST Beverely Pace, MD  General Surgery (Surgeon) 03/19/2023  Smitty Cords, DO Primary Care Provider 03/14/2023  Julien Nordmann, MD Cardiology 04/01/2023  Louretta Shorten, MD Hematology 03/20/2023  Jomarie Longs, MD Psychiatry 01/08/2023   Allergies:   Allergies  Allergen Reactions   Flagyl [Metronidazole] Anaphylaxis   Cymbalta [Duloxetine  Hcl] Anxiety   Tape Rash    Paper tape okay to use per  patient.   Current Home Medications:   No current facility-administered medications for this encounter.    aspirin EC (ASPIRIN LOW DOSE) 81 MG tablet   bisacodyl (DULCOLAX) 5 MG EC tablet   butalbital-acetaminophen-caffeine (FIORICET) 50-325-40 MG tablet   Cholecalciferol 250 MCG (10000 UT) CAPS   cyanocobalamin (VITAMIN B12) 1000 MCG/ML injection   diclofenac Sodium (VOLTAREN) 1 % GEL   etodolac (LODINE) 500 MG tablet   ezetimibe (ZETIA) 10 MG tablet   fluticasone (FLONASE) 50 MCG/ACT nasal spray   furosemide (LASIX) 20 MG tablet   gabapentin (NEURONTIN) 600 MG tablet   hydrOXYzine (VISTARIL) 50 MG capsule   omeprazole (PRILOSEC) 40 MG capsule   ondansetron (ZOFRAN ODT) 4 MG disintegrating tablet   Oxcarbazepine (TRILEPTAL) 300 MG tablet   oxyCODONE-acetaminophen (PERCOCET) 7.5-325 MG tablet   rizatriptan (MAXALT-MLT) 10 MG disintegrating tablet   rosuvastatin (CRESTOR) 40 MG tablet   tiZANidine (ZANAFLEX) 4 MG tablet   traZODone (DESYREL) 100 MG tablet   triamcinolone ointment (KENALOG) 0.5 %   venlafaxine XR (EFFEXOR-XR) 37.5 MG 24 hr capsule   venlafaxine XR (EFFEXOR-XR) 75 MG 24 hr capsule   Azelastine HCl 137 MCG/SPRAY SOLN   estradiol (ESTRACE) 1 MG tablet   valACYclovir (VALTREX) 1000 MG tablet   Zoledronic Acid (ZOMETA) 4 MG/100ML IVPB   History:   Past Medical History:  Diagnosis Date   Abdominal wall seroma (postoperative) 2025   Allergy    Anxiety    Aortic atherosclerosis (HCC)    Arthritis    Back pain    a.) s/p L4-L5 fusion 2008; b.) s/p PLIF L2-L3 and ALIF L5-S1 12/2021   Bilateral carotid artery disease (HCC)    Bipolar disorder (HCC)    Blood transfusion without reported diagnosis 1977   Transfusions from miscarriage & hemorrhaging   CAD (coronary artery disease)    Chronic kidney disease    DDD (degenerative disc disease), cervical    a.) s/p ACDF C4-C7   Depression    Disp fx of cuboid bone of right foot, init for clos fx 01/01/2017    Displacement of breast implant 2025   Fibromyalgia    GERD (gastroesophageal reflux disease)    Headache    Hepatitis C virus infection cured after antiviral drug therapy    a.) s/p Tx with ledipasvir/sofosbuvir   History of 2019 novel coronavirus disease (COVID-19) 01/23/2020   Hollenhorst plaque, right eye    Hyperlipidemia    Insomnia    a.) uses trazodone PRN   Iron deficiency anemia following bariatric surgery    Long-term use of aspirin therapy    Lumbar radiculopathy    Neck pain    Neuromuscular disorder (HCC) 1997   Falling to much was an eye-opener   Osteoporosis    PSVT (paroxysmal supraventricular tachycardia) (HCC)    Systolic murmur    Thyroid disease    Urine incontinence    Past Surgical History:  Procedure Laterality Date   5 miscarriages     1977-1985, Blood transfusion s/p miscarriage 1977   ABDOMINAL EXPOSURE N/A 12/18/2021   Procedure: ABDOMINAL EXPOSURE;  Surgeon: Cephus Shelling, MD;  Location[facility] OR;  Service: Vascular;  Laterality: N/A;   ACDF C4-C7  02/10/2015   ANTERIOR LUMBAR FUSION N/A 12/18/2021   Procedure: Anterior Lumbar Interbody Fusion  - Lumbar Five-Sacral One;  Surgeon: Tia Alert, MD;  Location: [facility] OR;  Service: Neurosurgery;  Laterality: N/A;   APPENDECTOMY  12/2008   AUGMENTATION MAMMAPLASTY Bilateral 2015   AUGMENTATION MAMMAPLASTY Bilateral 2018   implants redone w/ placement of implants under muscle   breast lift bilateral, implants  05/18/2015   bilateral, silicon naturel   BREAST REDUCTION SURGERY Bilateral 1997   CESAREAN SECTION  07/27/1981   Placenta Previa   COLONOSCOPY WITH PROPOFOL N/A 06/13/2020   Procedure: COLONOSCOPY WITH PROPOFOL;  Surgeon: Pasty Spillers, MD;  Location: [facility] ENDOSCOPY;  Service: Endoscopy;  Laterality: N/A;   COSMETIC SURGERY     Breast implants w/ lift, tummy tuck, upper & lower Blepharop   DILATION AND CURETTAGE OF UTERUS     ESOPHAGOGASTRODUODENOSCOPY (EGD) WITH PROPOFOL N/A  06/13/2020   Procedure: ESOPHAGOGASTRODUODENOSCOPY (EGD) WITH PROPOFOL;  Surgeon: Pasty Spillers, MD;  Location: [facility] ENDOSCOPY;  Service: Endoscopy;  Laterality: N/A;   EYE SURGERY  2017   Cataract surgery & lasik   GASTRIC BYPASS  12/24/2003   IVC FILTER INSERTION  12/2003   TrapEase Vena Cava Filter   LAPAROSCOPIC CHOLECYSTECTOMY N/A 03/2004   LUMBAR LAMINECTOMY  06/2006   L4-L5 (spinal fusion)   mini tummy tuck  05/07/2016   Bilateral bra/back roll lift skin removal   REDUCTION MAMMAPLASTY Bilateral 1997   SHOULDER ACROMIOPLASTY Left 03/27/2013   w/ labral debridement   SPINAL CORD STIMULATOR IMPLANT  11/2010   removed 05/2011   TOTAL ABDOMINAL HYSTERECTOMY N/A 1987   Family History  Problem Relation Age of Onset   COPD Mother    Lung cancer Mother 71   Diabetes Mother    Heart disease Mother    Stroke Mother    Heart attack Mother    Arthritis Mother    Hypertension Mother    Obesity Mother    Varicose Veins Mother    Heart disease Father    Heart attack Father 40   Early death Father    Depression Sister        x 5 sisters   COPD Sister    Hypertension Sister    Colon polyps Sister    Hearing loss Sister    Heart attack Sister    Arthritis Sister    Varicose Veins Sister    COPD Sister    Obesity Sister    Depression Sister    Depression Sister    Heart disease Sister    Hypertension Sister    Hypertension Sister    Obesity Sister    Heart disease Brother        x 3 brothers   Diabetes Brother    Heart attack Brother    Arthritis Brother    Heart disease Brother    Hypertension Brother    Diabetes Brother    Heart disease Brother    Hypertension Brother    Obesity Brother    Varicose Veins Brother    Heart disease Brother    Hypertension Brother    Obesity Brother    Miscarriages / Stillbirths Maternal Aunt    Miscarriages / Stillbirths Paternal Aunt    Breast cancer Paternal Aunt        early years   Cancer Maternal Grandfather     Stroke Maternal Grandfather    Social History   Tobacco Use   Smoking status: Former    Current packs/day: 0.00    Average packs/day: 0.3 packs/day for 10.0 years (2.5 ttl pk-yrs)    Types: Cigarettes    Start date: 11/28/1985    Quit date: 11/29/1995  Years since quitting: 27.3    Passive exposure: Past   Smokeless tobacco: Former   Tobacco comments:    Quite 1997, didnt smoke hardly at all when I was smoking.  Substance Use Topics   Alcohol use: No   Pertinent Clinical Results:  LABS:  Lab Results  Component Value Date   WBC 4.8 03/19/2023   HGB 14.4 03/19/2023   HCT 43.3 03/19/2023   MCV 92.3 03/19/2023   PLT 177 03/19/2023   Lab Results  Component Value Date   NA 137 03/19/2023   CL 102 03/19/2023   K 4.5 03/19/2023   CO2 26 03/19/2023   BUN 15 03/19/2023   CREATININE 0.98 03/19/2023   GFRNONAA >60 03/19/2023   CALCIUM 8.6 (L) 03/19/2023   ALBUMIN 4.1 11/15/2021   GLUCOSE 100 (H) 03/19/2023    ECG: Date: 03/29/2023  Time ECG obtained: 1020 AM Rate: 67 bpm Rhythm: normal sinus Axis (leads I and aVF): normal Intervals: PR 208 ms. QRS 96 ms. QTc 412 ms. ST segment and T wave changes: No evidence of acute T wave abnormalities or significant ST segment elevation or depression.  Evidence of a possible, age undetermined, prior infarct:  No Comparison: Similar to previous tracing obtained on 02/05/2022   IMAGING / PROCEDURES: MR ABDOMEN WWO CONTRAST performed on 02/21/2023 Evidence of prior abdominoplasty.  Unchanged hemorrhagic or proteinaceous fluid collection in the superficial soft tissues of the ventral abdomen overlying the left rectus musculature at the level of the umbilicus, measuring 2.9 x 1.7 cm. This is consistent with a small chronic postoperative seroma. Status post cholecystectomy. Infrarenal IVC filter.  CT ABDOMEN PELVIS W CONTRAST performed on 09/28/2022 3 x 1.8 cm fluid collection in the anterior abdominal wall to the left of the  umbilicus, likely postoperative seroma. Otherwise no intra-abdominal mass. Prior gastric bypass.  No complicating feature. No acute findings.  LONG TERM CARDIAC EVENT MONITOR STUDY performed on  03/09/2022 Patch Wear Time:  13 days and 14 hours (2024-02-08T15:55:53-0500 to 2024-02-22T05:57:36-0500) Patient had a min HR of 48 bpm, max HR of 193 bpm, and avg HR of 75 bpm.  Predominant underlying rhythm was Sinus Rhythm.  8 Supraventricular Tachycardia runs occurred, the run with the fastest interval lasting 6 beats with a max rate of 193 bpm, the longest lasting 13 beats with an avg rate of 112 bpm.  Supraventricular Tachycardia was detected within +/- 45 seconds of symptomatic patient event(s).  Isolated SVEs were rare (<1.0%), SVE Couplets were rare (<1.0%), and SVE Triplets were rare (<1.0%).  Isolated VEs were rare (<1.0%), and no VE Couplets or VE Triplets were present. Ventricular Bigeminy and Trigeminy were present.   TRANSTHORACIC ECHOCARDIOGRAM performed on 02/28/2022 Left ventricular ejection fraction, by estimation, is 60 to 65%. The left ventricle has normal function. The left ventricle has no regional wall motion abnormalities. There is mild left ventricular hypertrophy. Left ventricular diastolic function could not be evaluated.  Right ventricular systolic function is normal. The right ventricular size is normal.  The mitral valve is normal in structure. No evidence of mitral valve regurgitation.  The aortic valve was not well visualized. Aortic valve regurgitation is not visualized.  The inferior vena cava is normal in size with greater than 50% respiratory variability, suggesting right atrial pressure of 3 mmHg.   MYOCARDIAL PERFUSION IMAGING STUDY (LEXISCAN) performed on 08/08/2020 There was no ST segment deviation noted during stress. No T wave inversion was noted during stress. There is a small defect of mild severity present  in the apex location likely artifact. The left  ventricular ejection fraction is normal (55-65%). CT attenuation images shows moderate aortic and coronary calcifications. The study is normal and low risk.  CT CARDIAC SCORING performed on 06/06/2017 Aortic atherosclerosis. No acute extra cardiac abnormality.  Coronary calcium score of 2456 . This was 99th percentile for age and sex matched control. Consider f/u heart catheterization or perfusion study given how extensive the coronary calcification is.  VAS US CAROTID performed on 05/23/2017 Velocities in the right ICA are consistent with a 1-39% stenosis. Non-hemodynamically significant plaque <50% noted in the CCA.  Velocities in the left ICA are consistent with a 1-39% stenosis. Non-hemodynamically significant plaque noted in the CCA.  Bilateral vertebral arteries demonstrate antegrade flow.  Normal flow hemodynamics were seen in bilateral subclavian arteries.   Impression and Plan:  Julena Barbour has been referred for pre-anesthesia review and clearance prior to her undergoing the planned anesthetic and procedural courses. Available labs, pertinent testing, and imaging results were personally reviewed by me in preparation for upcoming operative/procedural course. [facility] medical record has been updated following extensive record review and patient interview with PAT staff.   This patient has been appropriately cleared by cardiology with an overall ACCEPTABLE risk of experiencing significant perioperative cardiovascular complications. Based on clinical review performed today (04/02/23), barring any significant acute changes in the patient's overall condition, it is anticipated that she will be able to proceed with the planned surgical intervention. Any acute changes in clinical condition may necessitate her procedure being postponed and/or cancelled. Patient will meet with anesthesia team (MD and/or CRNA) on the day of her procedure for preoperative evaluation/assessment. Questions regarding  anesthetic course will be fielded at that time.   Pre-surgical instructions were reviewed with the patient during his PAT appointment, and questions were fielded to satisfaction by PAT clinical staff. She has been instructed on which medications that she will need to hold prior to surgery, as well as the ones that have been deemed safe/appropriate to take on the day of his procedure. As part of the general education provided by PAT, patient made aware both verbally and in writing, that she would need to abstain from the use of any illegal substances during his perioperative course. She was advised that failure to follow the provided instructions could necessitate case cancellation or result in serious perioperative complications up to and including death. Patient encouraged to contact PAT and/or her surgeon's office to discuss any questions or concerns that may arise prior to surgery; verbalized understanding.   Quentin Mulling, MSN, APRN, FNP-C, CEN [facility]  Perioperative Services Nurse Practitioner Phone: [phone] Fax: [phone] 04/02/23 3:04 PM  NOTE: This note has been prepared using Dragon dictation software. Despite my best ability to proofread, there is always the potential that unintentional transcriptional errors may still occur from this process.

## 2023-04-05 ENCOUNTER — Encounter: Payer: Self-pay | Admitting: Internal Medicine

## 2023-04-05 ENCOUNTER — Telehealth: Payer: Medicare HMO | Admitting: Psychiatry

## 2023-04-05 ENCOUNTER — Encounter: Payer: Self-pay | Admitting: Psychiatry

## 2023-04-05 DIAGNOSIS — F3178 Bipolar disorder, in full remission, most recent episode mixed: Secondary | ICD-10-CM | POA: Diagnosis not present

## 2023-04-05 DIAGNOSIS — F4310 Post-traumatic stress disorder, unspecified: Secondary | ICD-10-CM | POA: Diagnosis not present

## 2023-04-05 DIAGNOSIS — F410 Panic disorder [episodic paroxysmal anxiety] without agoraphobia: Secondary | ICD-10-CM

## 2023-04-05 DIAGNOSIS — G4701 Insomnia due to medical condition: Secondary | ICD-10-CM

## 2023-04-05 MED ORDER — VENLAFAXINE HCL ER 150 MG PO CP24: 150.0000 mg | ORAL_CAPSULE | Freq: Every day | ORAL | 0 refills | Status: DC

## 2023-04-05 MED ORDER — TRAZODONE HCL 100 MG PO TABS: 100.0000 mg | ORAL_TABLET | Freq: Every evening | ORAL | Status: DC | PRN

## 2023-04-05 NOTE — Progress Notes (Signed)
 Virtual Visit via Video Note  I connected with Ashley Fields on 04/05/23 at 11:20 AM EDT by a video enabled telemedicine application and verified that I am speaking with the correct person using two identifiers.  Location Provider Location : [facility] Patient Location : Home  Participants: Patient , Provider    I discussed the limitations of evaluation and management by telemedicine and the availability of in person appointments. The patient expressed understanding and agreed to proceed.   I discussed the assessment and treatment plan with the patient. The patient was provided an opportunity to ask questions and all were answered. The patient agreed with the plan and demonstrated an understanding of the instructions.   The patient was advised to call back or seek an in-person evaluation if the symptoms worsen or if the condition fails to improve as anticipated.   BH MD OP Progress Note  04/05/2023 2:18 PM Carlyann Placide  MRN:  [id number]  Chief Complaint:  Chief Complaint  Patient presents with   Follow-up   Depression   Anxiety   Medication Refill   HPI: Ashley Fields is a 68 year old Caucasian female, married, on disability, lives in [facility], has a history of bipolar disorder, PTSD, panic disorder, insomnia, chronic pain, migraine headaches, history of laparoscopic gastric bypass was evaluated by telemedicine today.  She has been experiencing significant difficulty with sleep onset, noting that she did not sleep at all one night and only managed to fall asleep at 2:30 AM the following night. These sleep disturbances began approximately a week to a week and a half ago. Despite taking her prescribed medications, including trazodone, she continues to experience these issues.  She has a history of bipolar disorder, PTSD, panic attacks, and sleep problems. Recently, she experienced a panic attack while discussing a traumatic incident involving her dog with a person named Theodoro Grist ( the dog's  previous owner). During the attack, she felt on the verge of losing her breath. She attempted to manage the panic attack with hydroxyzine, which provided limited relief. Her sister advised her to sit down and drink water during these episodes, but she finds this strategy ineffective.  Denies suicidality, homicidality or perceptual disturbances.  She is scheduled for surgery on March 31st to remove a knot in her abdomen, identified as a chronic seroma. The mass is located on the left side and has been increasing in size over the past few months. Attempts to aspirate the mass were unsuccessful as it contained no fluid. She reports pain associated with the mass, which prevents her from wearing pants.    Visit Diagnosis:    ICD-10-CM   1. Bipolar disorder, in full remission, most recent episode mixed (HCC)  F31.78 venlafaxine XR (EFFEXOR XR) 150 MG 24 hr capsule    2. PTSD (post-traumatic stress disorder)  F43.10 venlafaxine XR (EFFEXOR XR) 150 MG 24 hr capsule    3. Panic disorder  F41.0 venlafaxine XR (EFFEXOR XR) 150 MG 24 hr capsule    4. Insomnia due to medical condition  G47.01 traZODone (DESYREL) 100 MG tablet   pain, lack of sleep hygiene      Past Psychiatric History: I have reviewed past psychiatric history from progress note on 05/15/2018.  Past trials of Prozac, Wellbutrin, Lexapro, Cymbalta, Rexulti, Ambien-nightmares, Lunesta, Lamictal, risperidone.  Past Medical History:  Past Medical History:  Diagnosis Date   Abdominal wall seroma (postoperative) 2025   Allergy    Anxiety    Aortic atherosclerosis (HCC)    Arthritis  Back pain    a.) s/p L4-L5 fusion 2008; b.) s/p PLIF L2-L3 and ALIF L5-S1 12/2021   Bilateral carotid artery disease (HCC)    Bipolar disorder (HCC)    Blood transfusion without reported diagnosis 1977   Transfusions from miscarriage & hemorrhaging   CAD (coronary artery disease)    Chronic kidney disease    DDD (degenerative disc disease), cervical     a.) s/p ACDF C4-C7   Depression    Disp fx of cuboid bone of right foot, init for clos fx 01/01/2017   Displacement of breast implant 2025   Fibromyalgia    GERD (gastroesophageal reflux disease)    Headache    Hepatitis C virus infection cured after antiviral drug therapy    a.) s/p Tx with ledipasvir/sofosbuvir   History of 2019 novel coronavirus disease (COVID-19) 01/23/2020   Hollenhorst plaque, right eye    Hyperlipidemia    Insomnia    a.) uses trazodone PRN   Iron deficiency anemia following bariatric surgery    Long-term use of aspirin therapy    Lumbar radiculopathy    Neck pain    Neuromuscular disorder (HCC) 1997   Falling to much was an eye-opener   Osteoporosis    PSVT (paroxysmal supraventricular tachycardia) (HCC)    Systolic murmur    Thyroid disease    Urine incontinence     Past Surgical History:  Procedure Laterality Date   5 miscarriages     1977-1985, Blood transfusion s/p miscarriage 1977   ABDOMINAL EXPOSURE N/A 12/18/2021   Procedure: ABDOMINAL EXPOSURE;  Surgeon: Cephus Shelling, MD;  Location: [facility];  Service: Vascular;  Laterality: N/A;   ACDF C4-C7  02/10/2015   ANTERIOR LUMBAR FUSION N/A 12/18/2021   Procedure: Anterior Lumbar Interbody Fusion  - Lumbar Five-Sacral One;  Surgeon: Tia Alert, MD;  Location: [facility] OR;  Service: Neurosurgery;  Laterality: N/A;   APPENDECTOMY  12/2008   AUGMENTATION MAMMAPLASTY Bilateral 2015   AUGMENTATION MAMMAPLASTY Bilateral 2018   implants redone w/ placement of implants under muscle   breast lift bilateral, implants  05/18/2015   bilateral, silicon naturel   BREAST REDUCTION SURGERY Bilateral 1997   CESAREAN SECTION  07/27/1981   Placenta Previa   COLONOSCOPY WITH PROPOFOL N/A 06/13/2020   Procedure: COLONOSCOPY WITH PROPOFOL;  Surgeon: Pasty Spillers, MD;  Location: [facility] ENDOSCOPY;  Service: Endoscopy;  Laterality: N/A;   COSMETIC SURGERY     Breast implants w/ lift, tummy tuck, upper & lower  Blepharop   DILATION AND CURETTAGE OF UTERUS     ESOPHAGOGASTRODUODENOSCOPY (EGD) WITH PROPOFOL N/A 06/13/2020   Procedure: ESOPHAGOGASTRODUODENOSCOPY (EGD) WITH PROPOFOL;  Surgeon: Pasty Spillers, MD;  Location: [facility] ENDOSCOPY;  Service: Endoscopy;  Laterality: N/A;   EYE SURGERY  2017   Cataract surgery & lasik   GASTRIC BYPASS  12/24/2003   IVC FILTER INSERTION  12/2003   TrapEase Vena Cava Filter   LAPAROSCOPIC CHOLECYSTECTOMY N/A 03/2004   LUMBAR LAMINECTOMY  06/2006   L4-L5 (spinal fusion)   mini tummy tuck  05/07/2016   Bilateral bra/back roll lift skin removal   REDUCTION MAMMAPLASTY Bilateral 1997   SHOULDER ACROMIOPLASTY Left 03/27/2013   w/ labral debridement   SPINAL CORD STIMULATOR IMPLANT  11/2010   removed 05/2011   TOTAL ABDOMINAL HYSTERECTOMY N/A 1987    Family Psychiatric History: I have reviewed family psychiatric history from progress note on 05/15/2018.  Family History:  Family History  Problem Relation Age of Onset  COPD Mother    Lung cancer Mother 71   Diabetes Mother    Heart disease Mother    Stroke Mother    Heart attack Mother    Arthritis Mother    Hypertension Mother    Obesity Mother    Varicose Veins Mother    Heart disease Father    Heart attack Father 63   Early death Father    Depression Sister        x 5 sisters   COPD Sister    Hypertension Sister    Colon polyps Sister    Hearing loss Sister    Heart attack Sister    Arthritis Sister    Varicose Veins Sister    COPD Sister    Obesity Sister    Depression Sister    Depression Sister    Heart disease Sister    Hypertension Sister    Hypertension Sister    Obesity Sister    Heart disease Brother        x 3 brothers   Diabetes Brother    Heart attack Brother    Arthritis Brother    Heart disease Brother    Hypertension Brother    Diabetes Brother    Heart disease Brother    Hypertension Brother    Obesity Brother    Varicose Veins Brother    Heart disease  Brother    Hypertension Brother    Obesity Brother    Miscarriages / Stillbirths Maternal Aunt    Miscarriages / Stillbirths Paternal Aunt    Breast cancer Paternal Aunt        early years   Cancer Maternal Grandfather    Stroke Maternal Grandfather     Social History: I have reviewed social history from progress note on 05/15/2018. Social History   Socioeconomic History   Marital status: Married    Spouse name: Czarina, Gingras (Spouse) [phone] ([place])   Number of children: 1   Years of education: High School   Highest education level: GED or equivalent  Occupational History   Occupation: disability  Tobacco Use   Smoking status: Former    Current packs/day: 0.00    Average packs/day: 0.3 packs/day for 10.0 years (2.5 ttl pk-yrs)    Types: Cigarettes    Start date: 11/28/1985    Quit date: 11/29/1995    Years since quitting: 27.3    Passive exposure: Past   Smokeless tobacco: Former   Tobacco comments:    Quite 1997, didnt smoke hardly at all when I was smoking.  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]   Social Drivers of Health   Financial Resource Strain: Medium Risk (03/19/2023)   [facility] from [facility]   Overall Financial Resource Strain (CARDIA)    Difficulty of Paying Living Expenses: Somewhat hard  Food Insecurity: Food Insecurity Present (03/19/2023)   [facility] from [facility]   Hunger Vital Sign    Worried About Running Out of Food in the Last Year: Sometimes true    Ran Out of Food in the Last Year: Sometimes true  Transportation  Needs: No Transportation  Needs (03/19/2023)   [facility]    In the past 12 months, has lack of transportation kept you from medical appointments or from getting medications?: No    Lack of Transportation (Non-Medical): No  Physical Activity: Insufficiently Active (02/11/2023)   Exercise Vital Sign    Days of Exercise per Week: 4 days    Minutes of Exercise per Session: 20 min  Stress: Stress Concern Present (02/11/2023)   [employer] of Occupational Health - Occupational Stress Questionnaire    Feeling of Stress : To some extent  Social Connections: Unknown (02/11/2023)   Social Connection and Isolation Panel [NHANES]    Frequency of Communication with Friends and Family: More than three times a week    Frequency of Social Gatherings with Friends and Family: Three times a week    Attends Religious Services: Patient declined    Active Member of Clubs or Organizations: No    Attends Banker Meetings: Never    Marital Status: Married    Allergies:  Allergies  Allergen Reactions   Flagyl [Metronidazole] Anaphylaxis   Cymbalta [Duloxetine Hcl] Anxiety   Tape Rash    Paper tape okay to use per patient.    Metabolic Disorder Labs: Lab Results  Component Value Date   HGBA1C 5.4 05/01/2022   MPG 108 05/01/2022   MPG 91 04/28/2021   No results found for: "PROLACTIN" Lab Results  Component Value Date   CHOL 133 05/01/2022   TRIG 130 05/01/2022   HDL 69 05/01/2022   CHOLHDL 1.9 05/01/2022   LDLCALC 43 05/01/2022   LDLCALC 40 04/28/2021   Lab Results  Component Value Date   TSH 2.11 05/01/2022   TSH 3.29 04/28/2021    Therapeutic Level Labs: No results found for: "LITHIUM" No results found for: "VALPROATE" No results found for: "CBMZ"  Current Medications: Current Outpatient Medications  Medication Sig Dispense Refill   aspirin EC (ASPIRIN LOW DOSE) 81 MG tablet TAKE 1 TABLET BY MOUTH EVERY DAY 90 tablet 0   Azelastine  HCl 137 MCG/SPRAY SOLN PLACE 2 SPRAYS INTO BOTH NOSTRILS 2 (TWO) TIMES DAILY. USE IN EACH NOSTRIL AS DIRECTED 48 mL 0   bisacodyl (DULCOLAX) 5 MG EC tablet Take 5 mg by mouth daily as needed for moderate constipation.     butalbital-acetaminophen-caffeine (FIORICET) 50-325-40 MG tablet Take 1 tablet by mouth 2 (two) times daily as needed for headache.     Cholecalciferol 250 MCG (10000 UT) CAPS Take 10,000 Units by mouth once a week.     cyanocobalamin (VITAMIN B12) 1000 MCG/ML injection INJECT 1 ML (1,000 MCG TOTAL) INTO THE MUSCLE EVERY 30 DAYS. 3 mL 3   diclofenac Sodium (VOLTAREN) 1 % GEL Apply 2 g topically 4 (four) times daily as needed (pain).     estradiol (ESTRACE) 1 MG tablet TAKE 1 TABLET BY MOUTH EVERY DAY 90 tablet 0   etodolac (LODINE) 500 MG tablet Take 1 tablet by mouth 2 (two) times daily.     ezetimibe (ZETIA) 10 MG tablet TAKE 1 TABLET BY MOUTH EVERY DAY 90 tablet 3   fluticasone (FLONASE) 50 MCG/ACT nasal spray SPRAY 2 SPRAYS INTO EACH NOSTRIL EVERY DAY (Patient taking differently: Place 2 sprays into both nostrils daily as needed for allergies.) 48 mL 0   furosemide (LASIX) 20 MG tablet TAKE 1 TABLET ONE TIME DAILY AS NEEDED FOR FLUID OR EDEMA 90 tablet 1   gabapentin (  NEURONTIN) 600 MG tablet Take 1 tablet (600 mg total) by mouth in the morning, at noon, in the evening, and at bedtime. 360 tablet 3   hydrOXYzine (VISTARIL) 50 MG capsule Take 1 capsule (50 mg total) by mouth 2 (two) times daily as needed for anxiety. 180 capsule 1   omeprazole (PRILOSEC) 40 MG capsule Take 1 capsule (40 mg total) by mouth daily. 90 capsule 3   ondansetron (ZOFRAN ODT) 4 MG disintegrating tablet Take 1 tablet (4 mg total) by mouth every 8 (eight) hours as needed for nausea or vomiting. 30 tablet 2   Oxcarbazepine (TRILEPTAL) 300 MG tablet TAKE 1 TABLET BY MOUTH 2 TIMES DAILY. 180 tablet 1   oxyCODONE-acetaminophen (PERCOCET) 7.5-325 MG tablet Take 1 tablet by mouth every 6 (six) hours as needed  for severe pain.     rizatriptan (MAXALT-MLT) 10 MG disintegrating tablet Take 1 tablet (10 mg total) by mouth as needed for migraine. May repeat in 2 hours if needed, that is max dose for 24 hours. 10 tablet 3   rosuvastatin (CRESTOR) 40 MG tablet TAKE 1 TABLET BY MOUTH EVERY DAY 90 tablet 1   tiZANidine (ZANAFLEX) 4 MG tablet TAKE 1 TABLET BY MOUTH EVERY 6 HOURS 120 tablet 5   triamcinolone ointment (KENALOG) 0.5 % Apply 1 Application topically 2 (two) times daily. Apply to external ear for 2 weeks 30 g 2   valACYclovir (VALTREX) 1000 MG tablet TAKE 1 TABLET (1,000 MG TOTAL) BY MOUTH DAILY. FOR 5-7 DAYS, MAY REPEAT IF NEED FOR COLD SORE 90 tablet 1   venlafaxine XR (EFFEXOR XR) 150 MG 24 hr capsule Take 1 capsule (150 mg total) by mouth daily with breakfast. DOSE INCREASE 90 capsule 0   Zoledronic Acid (ZOMETA) 4 MG/100ML IVPB Once per year from Orthopedic 100 mL    traZODone (DESYREL) 100 MG tablet Take 1-2 tablets (100-200 mg total) by mouth at bedtime as needed for sleep. Has supplies     No current facility-administered medications for this visit.     Musculoskeletal: Strength & Muscle Tone:  UTA Gait & Station:  Seated Patient leans: N/A  Psychiatric Specialty Exam: Review of Systems  Psychiatric/Behavioral:  Positive for sleep disturbance. The patient is nervous/anxious.     There were no vitals taken for this visit.There is no height or weight on file to calculate BMI.  General Appearance: Fairly Groomed  Eye Contact:  Good  Speech:  Clear and Coherent  Volume:  Normal  Mood:  Anxious  Affect:  Appropriate  Thought Process:  Goal Directed and Descriptions of Associations: Intact  Orientation:  Full (Time, Place, and Person)  Thought Content: Logical   Suicidal Thoughts:  No  Homicidal Thoughts:  No  Memory:  Immediate;   Fair Recent;   Fair Remote;   Fair  Judgement:  Fair  Insight:  Fair  Psychomotor Activity:  Normal  Concentration:  Concentration: Fair and  Attention Span: Fair  Recall:  [employer] of Knowledge: Fair  Language: Fair  Akathisia:  No  Handed:  Right  AIMS (if indicated): not done  Assets:  Desire for Improvement Housing Social Support Transportation  ADL's:  Intact  Cognition: WNL  Sleep:  Poor   Screenings: AIMS    Flowsheet Row Video Visit from 06/13/2021 in [facility] Video Visit from 05/16/2021 in [facility]  AIMS Total Score 0 0      GAD-7    Flowsheet Row  Office Visit from 03/14/2023 in [facility] Office Visit from 10/31/2022 in [facility] Office Visit from 10/15/2022 in [facility] Office Visit from 09/13/2022 in [facility] Office Visit from 05/01/2022 in [facility]  Total GAD-7 Score 3 4 7 12  0      PHQ2-9    Flowsheet Row Office Visit from 03/14/2023 in [facility] Office Visit from 10/31/2022 in [facility] Office Visit from 10/15/2022 in [facility] Office Visit from 09/13/2022 in [facility] Clinical Support from 07/20/2022 in [facility]  PHQ-2 Total Score 1 2 2 2  0  PHQ-9 Total Score 3 3 3 4  0      Flowsheet Row Video Visit from 04/05/2023 in [facility] Video Visit from 01/08/2023 in [facility] Office Visit from 10/15/2022 in [facility]  C-SSRS RISK CATEGORY No Risk No Risk No Risk        Assessment and Plan: Eller Sweis is a 68 year old Caucasian female who has a history of PTSD, bipolar disorder, panic disorder, migraine headaches, chronic seroma of abdominal wall with  upcoming surgery was evaluated by telemedicine today.  Discussed assessment and plan as noted below.  Insomnia-unstable Difficulty falling asleep, with onset as late as 2:30 AM or not at all on some nights, began about a week to a week and a half ago. Likely related to situational stressors, including upcoming surgery and a recent traumatic event involving her dog. Currently taking trazodone. - Increase Trazodone to 150 mg (one and a half tablets) at night. If ineffective, increase to 200 mg (two tablets). - Hold off on increasing Venlafaxine since Trazodone is being increased at least for the next 7 to 10 days.  This is to avoid drug-drug interaction with increasing both medications together at the same time.  Panic disorder-unstable Experiences panic attacks triggered by a traumatic event involving her dog and stress of upcoming surgery. Feels on the verge of a panic attack when discussing the event with the dog's previous owner. Hydroxyzine is used as needed but is only somewhat effective. Emphasis on coping strategies and potential benefit of therapy was discussed. Venlafaxine dosage increase is considered if anxiety persists after addressing sleep issues. - Refer to a therapist for one-on-one therapy. Schedule an appointment with a therapist . - Consider increasing Venlafaxine to 150 mg if anxiety persists after addressing sleep issues. - Continue Hydroxyzine 50 mg twice a day as needed  PTSD-unstable Currently reports having anxiety attacks.  Recent traumatic event involving her dog. - Increase Venlafaxine extended release 150 mg as noted above. - Refer for CBT, patient to establish care with therapist.  Bipolar Disorder in remission Current symptoms do not indicate a resurgence of bipolar disorder, but monitoring for changes is necessary. Currently on Trileptal and venlafaxine. Increasing venlafaxine dosage is considered with caution due to potential impact on bipolar spectrum. - Monitor  for any signs of bipolar disorder exacerbation. - Continue Oxcarbazepine 300 mg twice daily - Continue Gabapentin 600 mg 4 times a day.  This is being prescribed by primary care provider for pain although it is also a mood stabilizer.  Follow-up in clinic in 4 to 6 weeks or sooner if needed.  Collaboration of Care: Collaboration of  Care: Referral or follow-up with counselor/therapist AEB patient to establish care with therapist I have communicated with staff.  Patient/Guardian was advised Release of Information must be obtained prior to any record release in order to collaborate their care with an outside provider. Patient/Guardian was advised if they have not already done so to contact the registration department to sign all necessary forms in order for [place] to release information regarding their care.   Consent: Patient/Guardian gives verbal consent for treatment and assignment of benefits for services provided during this visit. Patient/Guardian expressed understanding and agreed to proceed.   This note was generated in part or whole with voice recognition software. Voice recognition is usually quite accurate but there are transcription errors that can and very often do occur. I apologize for any typographical errors that were not detected and corrected.   Discussed the use of a AI scribe software for clinical note transcription with the patient, who gave verbal consent to proceed.   Jomarie Longs, MD 04/05/2023, 2:18 PM

## 2023-04-07 MED ORDER — CHLORHEXIDINE GLUCONATE 0.12 % MT SOLN
15.0000 mL | Freq: Once | OROMUCOSAL | Status: AC
  Administered 2023-04-08: 15 mL via OROMUCOSAL

## 2023-04-07 MED ORDER — LACTATED RINGERS IV SOLN: INTRAVENOUS | Status: DC

## 2023-04-07 MED ORDER — CEFAZOLIN SODIUM-DEXTROSE 2-4 GM/100ML-% IV SOLN
2.0000 g | INTRAVENOUS | Status: AC
  Administered 2023-04-08: 2 g via INTRAVENOUS

## 2023-04-07 MED ORDER — ORAL CARE MOUTH RINSE: 15.0000 mL | Freq: Once | OROMUCOSAL | Status: AC

## 2023-04-08 ENCOUNTER — Other Ambulatory Visit: Payer: Self-pay

## 2023-04-08 ENCOUNTER — Ambulatory Visit: Payer: Self-pay | Admitting: Urgent Care

## 2023-04-08 ENCOUNTER — Encounter: Payer: Self-pay | Admitting: General Surgery

## 2023-04-08 ENCOUNTER — Encounter
Admission: RE | Disposition: A | Discharge status: Home / Self Care | Payer: Self-pay | Source: Home / Self Care | Attending: General Surgery

## 2023-04-08 ENCOUNTER — Ambulatory Visit
Admission: RE | Admit: 2023-04-08 | Discharge: 2023-04-08 | Disposition: A | Discharge status: Home / Self Care | Attending: General Surgery | Admitting: General Surgery

## 2023-04-08 DIAGNOSIS — R1905 Periumbilic swelling, mass or lump: Secondary | ICD-10-CM | POA: Diagnosis not present

## 2023-04-08 DIAGNOSIS — K219 Gastro-esophageal reflux disease without esophagitis: Secondary | ICD-10-CM | POA: Insufficient documentation

## 2023-04-08 DIAGNOSIS — Z87891 Personal history of nicotine dependence: Secondary | ICD-10-CM | POA: Diagnosis not present

## 2023-04-08 DIAGNOSIS — I129 Hypertensive chronic kidney disease with stage 1 through stage 4 chronic kidney disease, or unspecified chronic kidney disease: Secondary | ICD-10-CM | POA: Diagnosis not present

## 2023-04-08 DIAGNOSIS — I739 Peripheral vascular disease, unspecified: Secondary | ICD-10-CM | POA: Diagnosis not present

## 2023-04-08 DIAGNOSIS — I2510 Atherosclerotic heart disease of native coronary artery without angina pectoris: Secondary | ICD-10-CM | POA: Insufficient documentation

## 2023-04-08 DIAGNOSIS — R0602 Shortness of breath: Secondary | ICD-10-CM

## 2023-04-08 DIAGNOSIS — G4700 Insomnia, unspecified: Secondary | ICD-10-CM | POA: Diagnosis not present

## 2023-04-08 DIAGNOSIS — N189 Chronic kidney disease, unspecified: Secondary | ICD-10-CM | POA: Insufficient documentation

## 2023-04-08 DIAGNOSIS — F419 Anxiety disorder, unspecified: Secondary | ICD-10-CM | POA: Diagnosis not present

## 2023-04-08 DIAGNOSIS — M797 Fibromyalgia: Secondary | ICD-10-CM | POA: Diagnosis not present

## 2023-04-08 DIAGNOSIS — D1803 Hemangioma of intra-abdominal structures: Secondary | ICD-10-CM | POA: Insufficient documentation

## 2023-04-08 DIAGNOSIS — F319 Bipolar disorder, unspecified: Secondary | ICD-10-CM | POA: Diagnosis not present

## 2023-04-08 DIAGNOSIS — E785 Hyperlipidemia, unspecified: Secondary | ICD-10-CM | POA: Diagnosis not present

## 2023-04-08 DIAGNOSIS — E782 Mixed hyperlipidemia: Secondary | ICD-10-CM | POA: Diagnosis not present

## 2023-04-08 DIAGNOSIS — Z79899 Other long term (current) drug therapy: Secondary | ICD-10-CM | POA: Diagnosis not present

## 2023-04-08 DIAGNOSIS — I25118 Atherosclerotic heart disease of native coronary artery with other forms of angina pectoris: Secondary | ICD-10-CM

## 2023-04-08 DIAGNOSIS — D1809 Hemangioma of other sites: Secondary | ICD-10-CM | POA: Diagnosis not present

## 2023-04-08 HISTORY — DX: Bipolar disorder, unspecified: F31.9

## 2023-04-08 HISTORY — PX: EXCISION OF ABDOMINAL WALL TUMOR: SHX6687

## 2023-04-08 HISTORY — DX: Atherosclerotic heart disease of native coronary artery without angina pectoris: I25.10

## 2023-04-08 HISTORY — DX: Partial retinal artery occlusion, right eye: H34.211

## 2023-04-08 HISTORY — DX: Other cervical disc degeneration, unspecified cervical region: M50.30

## 2023-04-08 HISTORY — DX: Long term (current) use of aspirin: Z79.82

## 2023-04-08 HISTORY — DX: Cardiac murmur, unspecified: R01.1

## 2023-04-08 HISTORY — DX: Other iron deficiency anemias: D50.8

## 2023-04-08 HISTORY — DX: Personal history of other infectious and parasitic diseases: Z86.19

## 2023-04-08 HISTORY — DX: Insomnia, unspecified: G47.00

## 2023-04-08 HISTORY — DX: Disorder of arteries and arterioles, unspecified: I77.9

## 2023-04-08 HISTORY — DX: Other complications of other bariatric procedure: K95.89

## 2023-04-08 HISTORY — DX: Supraventricular tachycardia, unspecified: I47.10

## 2023-04-08 HISTORY — DX: Atherosclerosis of aorta: I70.0

## 2023-04-08 SURGERY — EXCISION, NEOPLASM, ABDOMINAL WALL
Anesthesia: General | Site: Abdomen

## 2023-04-08 MED ORDER — KETOROLAC TROMETHAMINE 30 MG/ML IJ SOLN
INTRAMUSCULAR | Status: AC
Start: 2023-04-08 — End: ?
  Filled 2023-04-08: qty 1

## 2023-04-08 MED ORDER — PHENYLEPHRINE 80 MCG/ML (10ML) SYRINGE FOR IV PUSH (FOR BLOOD PRESSURE SUPPORT)
PREFILLED_SYRINGE | INTRAVENOUS | Status: DC | PRN
  Administered 2023-04-08: 160 ug via INTRAVENOUS
  Administered 2023-04-08 (×3): 80 ug via INTRAVENOUS

## 2023-04-08 MED ORDER — ONDANSETRON HCL 4 MG/2ML IJ SOLN
INTRAMUSCULAR | Status: AC
  Filled 2023-04-08: qty 2

## 2023-04-08 MED ORDER — PROPOFOL 10 MG/ML IV BOLUS
INTRAVENOUS | Status: AC
  Filled 2023-04-08: qty 20

## 2023-04-08 MED ORDER — MIDAZOLAM HCL 2 MG/2ML IJ SOLN
INTRAMUSCULAR | Status: AC
  Filled 2023-04-08: qty 2

## 2023-04-08 MED ORDER — CHLORHEXIDINE GLUCONATE 0.12 % MT SOLN
OROMUCOSAL | Status: AC
  Filled 2023-04-08: qty 15

## 2023-04-08 MED ORDER — PHENYLEPHRINE 80 MCG/ML (10ML) SYRINGE FOR IV PUSH (FOR BLOOD PRESSURE SUPPORT)
PREFILLED_SYRINGE | INTRAVENOUS | Status: AC
  Filled 2023-04-08: qty 10

## 2023-04-08 MED ORDER — ACETAMINOPHEN 10 MG/ML IV SOLN
INTRAVENOUS | Status: AC
  Filled 2023-04-08: qty 100

## 2023-04-08 MED ORDER — OXYCODONE HCL 5 MG/5ML PO SOLN: 5.0000 mg | Freq: Once | ORAL | Status: DC | PRN

## 2023-04-08 MED ORDER — BUPIVACAINE-EPINEPHRINE 0.5% -1:200000 IJ SOLN
INTRAMUSCULAR | Status: DC | PRN
  Administered 2023-04-08: 20 mL

## 2023-04-08 MED ORDER — ROCURONIUM BROMIDE 10 MG/ML (PF) SYRINGE
PREFILLED_SYRINGE | INTRAVENOUS | Status: AC
  Filled 2023-04-08: qty 10

## 2023-04-08 MED ORDER — 0.9 % SODIUM CHLORIDE (POUR BTL) OPTIME
TOPICAL | Status: DC | PRN
  Administered 2023-04-08: 500 mL

## 2023-04-08 MED ORDER — BUPIVACAINE-EPINEPHRINE (PF) 0.5% -1:200000 IJ SOLN
INTRAMUSCULAR | Status: AC
  Filled 2023-04-08: qty 10

## 2023-04-08 MED ORDER — DEXAMETHASONE SODIUM PHOSPHATE 10 MG/ML IJ SOLN
INTRAMUSCULAR | Status: AC
  Filled 2023-04-08: qty 1

## 2023-04-08 MED ORDER — FENTANYL CITRATE (PF) 100 MCG/2ML IJ SOLN
INTRAMUSCULAR | Status: DC | PRN
  Administered 2023-04-08 (×2): 25 ug via INTRAVENOUS

## 2023-04-08 MED ORDER — DROPERIDOL 2.5 MG/ML IJ SOLN: 0.6250 mg | Freq: Once | INTRAMUSCULAR | Status: DC | PRN

## 2023-04-08 MED ORDER — PROPOFOL 10 MG/ML IV BOLUS
INTRAVENOUS | Status: DC | PRN
  Administered 2023-04-08: 100 mg via INTRAVENOUS

## 2023-04-08 MED ORDER — MIDAZOLAM HCL 2 MG/2ML IJ SOLN
INTRAMUSCULAR | Status: DC | PRN
  Administered 2023-04-08: 2 mg via INTRAVENOUS

## 2023-04-08 MED ORDER — FENTANYL CITRATE (PF) 100 MCG/2ML IJ SOLN
INTRAMUSCULAR | Status: AC
  Filled 2023-04-08: qty 2

## 2023-04-08 MED ORDER — BUPIVACAINE-EPINEPHRINE (PF) 0.5% -1:200000 IJ SOLN
INTRAMUSCULAR | Status: AC
  Filled 2023-04-08: qty 20

## 2023-04-08 MED ORDER — OXYCODONE HCL 5 MG PO TABS: 5.0000 mg | ORAL_TABLET | Freq: Once | ORAL | Status: DC | PRN

## 2023-04-08 MED ORDER — KETOROLAC TROMETHAMINE 30 MG/ML IJ SOLN
INTRAMUSCULAR | Status: DC | PRN
Start: 2023-04-08 — End: 2023-04-08
  Administered 2023-04-08: 30 mg via INTRAVENOUS

## 2023-04-08 MED ORDER — EPHEDRINE 5 MG/ML INJ
INTRAVENOUS | Status: AC
  Filled 2023-04-08: qty 5

## 2023-04-08 MED ORDER — EPHEDRINE SULFATE-NACL 50-0.9 MG/10ML-% IV SOSY
PREFILLED_SYRINGE | INTRAVENOUS | Status: DC | PRN
  Administered 2023-04-08 (×4): 5 mg via INTRAVENOUS

## 2023-04-08 MED ORDER — FENTANYL CITRATE (PF) 100 MCG/2ML IJ SOLN: 25.0000 ug | INTRAMUSCULAR | Status: DC | PRN

## 2023-04-08 MED ORDER — CEFAZOLIN SODIUM-DEXTROSE 2-4 GM/100ML-% IV SOLN
INTRAVENOUS | Status: AC
  Filled 2023-04-08: qty 100

## 2023-04-08 MED ORDER — LIDOCAINE HCL (CARDIAC) PF 100 MG/5ML IV SOSY
PREFILLED_SYRINGE | INTRAVENOUS | Status: DC | PRN
  Administered 2023-04-08: 100 mg via INTRAVENOUS

## 2023-04-08 MED ORDER — ONDANSETRON HCL 4 MG/2ML IJ SOLN
INTRAMUSCULAR | Status: DC | PRN
  Administered 2023-04-08: 4 mg via INTRAVENOUS

## 2023-04-08 MED ORDER — DEXAMETHASONE SODIUM PHOSPHATE 10 MG/ML IJ SOLN
INTRAMUSCULAR | Status: DC | PRN
  Administered 2023-04-08: 8 mg via INTRAVENOUS

## 2023-04-08 MED ORDER — LIDOCAINE HCL (PF) 2 % IJ SOLN
INTRAMUSCULAR | Status: AC
  Filled 2023-04-08: qty 5

## 2023-04-08 MED ORDER — ACETAMINOPHEN 10 MG/ML IV SOLN: 1000.0000 mg | Freq: Once | INTRAVENOUS | Status: DC | PRN

## 2023-04-08 MED ORDER — ACETAMINOPHEN 10 MG/ML IV SOLN
INTRAVENOUS | Status: DC | PRN
  Administered 2023-04-08: 1000 mg via INTRAVENOUS

## 2023-04-08 SURGICAL SUPPLY — 23 items
CHLORAPREP W/TINT 26 (MISCELLANEOUS) IMPLANT
DERMABOND ADVANCED .7 DNX12 (GAUZE/BANDAGES/DRESSINGS) ×1 IMPLANT
DRAPE LAPAROTOMY 100X77 ABD (DRAPES) ×1 IMPLANT
ELECT REM PT RETURN 9FT ADLT (ELECTROSURGICAL) ×1 IMPLANT
ELECTRODE REM PT RTRN 9FT ADLT (ELECTROSURGICAL) ×1 IMPLANT
GLOVE BIO SURGEON STRL SZ 6.5 (GLOVE) ×1 IMPLANT
GLOVE BIOGEL PI IND STRL 6.5 (GLOVE) ×1 IMPLANT
GOWN STRL REUS W/ TWL LRG LVL3 (GOWN DISPOSABLE) ×2 IMPLANT
KIT TURNOVER KIT A (KITS) ×1 IMPLANT
LABEL OR SOLS (LABEL) ×1 IMPLANT
MANIFOLD NEPTUNE II (INSTRUMENTS) ×1 IMPLANT
NDL HYPO 25X1 1.5 SAFETY (NEEDLE) ×1 IMPLANT
NEEDLE HYPO 25X1 1.5 SAFETY (NEEDLE) ×1 IMPLANT
NS IRRIG 500ML POUR BTL (IV SOLUTION) ×1 IMPLANT
PACK BASIN MINOR ARMC (MISCELLANEOUS) ×1 IMPLANT
SUT ETHILON 3-0 (SUTURE) IMPLANT
SUT MNCRL 4-0 27 PS-2 XMFL (SUTURE) ×1 IMPLANT
SUT VIC AB 2-0 SH 27XBRD (SUTURE) IMPLANT
SUT VIC AB 3-0 SH 27X BRD (SUTURE) ×1 IMPLANT
SUTURE MNCRL 4-0 27XMF (SUTURE) ×1 IMPLANT
SYR 10ML LL (SYRINGE) ×1 IMPLANT
TRAP FLUID SMOKE EVACUATOR (MISCELLANEOUS) ×1 IMPLANT
WATER STERILE IRR 500ML POUR (IV SOLUTION) ×1 IMPLANT

## 2023-04-08 NOTE — Op Note (Signed)
 OPERATION REPORT  Pre Operative Diagnosis: Abdominal wall mass  Post operative diagnosis: Same  Anesthesia: MAC and Local   Surgeon: Dr. Hazle Quant   Indication: This 68 y.o. year old female with a soft tissue mass that is causing   Description of procedure: after orienting patient about the procedure steps and benefits and patient agreed to proceed. Time out was done identifying correct patient and location of procedure. After induction of general anesthesia, local anesthesia was infiltrated around the palpable abdominal wall mass. With a blade #15, an elliptical incision was made using the skin lines. Sharp dissection was carried down deep into the anterior fascia and mass was excised completely. The mass measured 7 cm. Deep abdominal wall tissue was approximated with 2-0 Vicryl stitches and skin closed with Monocryl 4-0 in subcuticular fashion. Specimen sent to pathology.    Complications: none   EBL: minimal  Carolan Shiver, MD, FACS

## 2023-04-08 NOTE — Anesthesia Preprocedure Evaluation (Addendum)
 Anesthesia Evaluation  Patient identified by MRN, date of birth, ID band Patient awake    Reviewed: Allergy & Precautions, NPO status , Patient's Chart, lab work & pertinent test results  Airway Mallampati: I  TM Distance: >3 FB Neck ROM: Full    Dental  (+) Upper Dentures, Lower Dentures, Dental Advisory Given   Pulmonary neg shortness of breath, former smoker   Pulmonary exam normal breath sounds clear to auscultation       Cardiovascular Exercise Tolerance: Good (-) angina (no recent chest pain) + CAD (coronary calcification on CT, aortic atherosclerosis) and + Peripheral Vascular Disease (Bilateral carotid artery disease)  Normal cardiovascular exam+ Valvular Problems/Murmurs  Rhythm:Regular Rate:Normal   Patient with history of BILATERAL carotid artery disease.  Most recent carotid Doppler was performed on 05/23/2017 revealing a 1-39% stenosis of the BILATERAL carotid arteries.  Vertebrals demonstrated antegrade flow.  There was normal flow hemodynamics in the BILATERAL subclavian arteries.    Coronary CTA was performed on 06/06/2017 that demonstrated an Agatston coronary artery calcium score of 2456. This placed patient in the 99th percentile for age, sex, and race matched controls.  Three-vessel calcium depositions observed.  Thoracic aorta also noted to have atherosclerotic changes.  Study demonstrates normal coronary origin with RIGHT dominance.    Most recent myocardial perfusion imaging study was performed on 08/08/2020 revealing a normal left ventricular systolic function with an EF of 55-65%.  There were no regional wall motion abnormalities.  No artifact or left ventricular cavity size enlargement appreciated on review of imaging. SPECT images demonstrated a small perfusion defect of mild severity present in the apex location.  Finding felt to likely represent artifact.  CT attenuation correction images showed moderate aortic  and coronary calcifications.  TID ratio = 1.03. Study determined to be normal and low risk.    Most recent TTE performed on 02/28/2022 revealed a normal left ventricular systolic function with an EF of 60-65%. There was mild LVH.  There were no regional wall motion abnormalities.  Left ventricular diastolic Doppler parameters were normal. Right ventricular size and function normal. There was no significant valvular regurgitation. All transvalvular gradients were noted to be normal providing no evidence suggestive of valvular stenosis. Aorta normal in size with no evidence of ectasia or aneurysmal dilatation.    Long-term cardiac event monitor study performed on 03/09/2022 showed a predominant underlying sinus rhythm at an average rate of 75 bpm; range 48-193 bpm.  There were 8 runs of PSVT with the fastest lasting 6 beats at a maximum rate of 193, and the longest lasting 13 beats at an average rate of 112 bpm.  Atrial and ventricular ectopy was noted.  There were no sustained arrhythmias or prolonged pauses noted.  PSVT coincided with patient triggered events.       Neuro/Psych  PSYCHIATRIC DISORDERS Anxiety Depression     s/p L4-L5 fusion 2008; b.) s/p PLIF L2-L3 and ALIF L5-S1 12/2021  Neuromuscular disease    GI/Hepatic ,GERD  Medicated and Controlled,,(+) Hepatitis -, CTreated w harvoni   Endo/Other    Renal/GU Renal InsufficiencyRenal diseaseLab Results      Component                Value               Date                      CREATININE  0.69                11/15/2021                BUN                      16                  11/15/2021                NA                       143                 11/15/2021                K                        4.2                 11/15/2021                    Musculoskeletal  (+) Arthritis ,  Fibromyalgia -  Abdominal  (+) + obese (BMI 32.6)  Peds  Hematology  (+) Blood dyscrasia Lab Results      Component                 Value               Date                            HGB                      14.5                11/15/2021                HCT                      45.0                11/15/2021                PLT                      171                 11/15/2021              Anesthesia Other Findings ALL: Flagyl, Cymbalta  Reproductive/Obstetrics                              Anesthesia Physical Anesthesia Plan  ASA: 3  Anesthesia Plan: General   Post-op Pain Management: Ofirmev IV (intra-op)*, Regional block* and Toradol IV (intra-op)*   Induction: Intravenous  PONV Risk Score and Plan: 4 or greater and Treatment may vary due to age or medical condition, Ondansetron, Midazolam and Dexamethasone  Airway Management Planned: LMA  Additional Equipment:   Intra-op Plan:   Post-operative Plan: Extubation in OR  Informed Consent: I have reviewed the patients History and Physical, chart, labs and discussed the procedure including the risks, benefits and alternatives for the proposed anesthesia with the patient or authorized representative who has  indicated his/her understanding and acceptance.     Dental advisory given  Plan Discussed with: CRNA and Anesthesiologist  Anesthesia Plan Comments: (  )        Anesthesia Quick Evaluation

## 2023-04-08 NOTE — Anesthesia Procedure Notes (Signed)
 Procedure Name: LMA Insertion Date/Time: 04/08/2023 7:36 AM  Performed by: Rich Brave, CRNAPre-anesthesia Checklist: Patient identified, Emergency Drugs available, Suction available, Patient being monitored and Timeout performed Patient Re-evaluated:Patient Re-evaluated prior to induction Oxygen Delivery Method: Circle system utilized Preoxygenation: Pre-oxygenation with 100% oxygen Induction Type: IV induction Ventilation: Mask ventilation without difficulty LMA: LMA inserted LMA Size: 4.0 Number of attempts: 1 Placement Confirmation: ETT inserted through vocal cords under direct vision, positive ETCO2 and breath sounds checked- equal and bilateral Tube secured with: Tape Dental Injury: Teeth and Oropharynx as per pre-operative assessment

## 2023-04-08 NOTE — Transfer of Care (Signed)
 Immediate Anesthesia Transfer of Care Note  Patient: Ashley Fields  Procedure(s) Performed: EXCISION, NEOPLASM, ABDOMINAL WALL (Abdomen)  Patient Location: PACU  Anesthesia Type:General  Level of Consciousness: awake, alert , oriented, and patient cooperative  Airway & Oxygen Therapy: Patient Spontanous Breathing and Patient connected to face mask oxygen  Post-op Assessment: Report given to RN, Post -op Vital signs reviewed and stable, and Patient moving all extremities X 4  Post vital signs: Reviewed and stable  Last Vitals:  Vitals Value Taken Time  BP 147/83 04/08/23 0830  Temp 97   Pulse 90 04/08/23 0832  Resp 14 04/08/23 0832  SpO2 100 % 04/08/23 0832  Vitals shown include unfiled device data.  Last Pain:  Vitals:   04/08/23 0631  TempSrc: Temporal  PainSc: 0-No pain         Complications: No notable events documented.

## 2023-04-08 NOTE — Interval H&P Note (Signed)
 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  The various methods of treatment have been discussed with the patient and family. After consideration of risks, benefits and other options for treatment, the patient has consented to  Procedure(s): EXCISION, NEOPLASM, ABDOMINAL WALL (N/A) as a surgical intervention.  The patient's history has been reviewed, patient examined, no change in status, stable for surgery.  I have reviewed the patient's chart and labs.  Questions were answered to the patient's satisfaction.     Carolan Shiver

## 2023-04-08 NOTE — Discharge Instructions (Addendum)
  Diet: Resume home heart healthy regular diet.   Activity: No heavy lifting >20 pounds (children, pets, laundry, garbage) or strenuous activity until follow-up, but light activity and walking are encouraged. Do not drive or drink alcohol if taking narcotic pain medications.  Wound care: May shower with soapy water and pat dry (do not rub incisions), but no baths or submerging incision underwater until follow-up. (no swimming)   Medications: Resume all home medications. For mild to moderate pain: acetaminophen (Tylenol) or ibuprofen (if no kidney disease). Combining Tylenol with alcohol can substantially increase your risk of causing liver disease.   May use your Oxycodone that you have at home for breakthrough pain as needed.   Call office [phone]) at any time if any questions, worsening pain, fevers/chills, bleeding, drainage from incision site, or other concerns.

## 2023-04-09 ENCOUNTER — Other Ambulatory Visit: Payer: Self-pay | Admitting: Emergency Medicine

## 2023-04-09 ENCOUNTER — Encounter: Payer: Self-pay | Admitting: General Surgery

## 2023-04-09 DIAGNOSIS — Z01810 Encounter for preprocedural cardiovascular examination: Secondary | ICD-10-CM

## 2023-04-09 DIAGNOSIS — I2510 Atherosclerotic heart disease of native coronary artery without angina pectoris: Secondary | ICD-10-CM

## 2023-04-09 NOTE — Anesthesia Postprocedure Evaluation (Signed)
 Anesthesia Post Note  Patient: Ashley Fields  Procedure(s) Performed: EXCISION, NEOPLASM, ABDOMINAL WALL (Abdomen)  Patient location during evaluation: PACU Anesthesia Type: General Level of consciousness: awake and alert Pain management: pain level controlled Vital Signs Assessment: post-procedure vital signs reviewed and stable Respiratory status: spontaneous breathing, nonlabored ventilation and respiratory function stable Cardiovascular status: blood pressure returned to baseline and stable Postop Assessment: no apparent nausea or vomiting Anesthetic complications: no   No notable events documented.   Last Vitals:  Vitals:   04/08/23 0901 04/08/23 0912  BP: (!) 143/74 (!) 148/80  Pulse: 87 88  Resp: 16 18  Temp: (!) 36.3 C 36.4 C  SpO2: 98% 100%    Last Pain:  Vitals:   04/08/23 0912  TempSrc: Temporal  PainSc: 2                  Foye Deer

## 2023-04-09 NOTE — Telephone Encounter (Signed)
 Yes please and thank you

## 2023-04-10 ENCOUNTER — Institutional Professional Consult (permissible substitution): Admitting: Plastic Surgery

## 2023-04-10 LAB — SURGICAL PATHOLOGY

## 2023-04-12 ENCOUNTER — Other Ambulatory Visit: Payer: Self-pay | Admitting: Family Medicine

## 2023-04-12 DIAGNOSIS — B379 Candidiasis, unspecified: Secondary | ICD-10-CM

## 2023-04-12 NOTE — Telephone Encounter (Signed)
 The original prescription was discontinued on 02/13/2023 by Smitty Cords, DO for the following reason: Completed Course.   Requested Prescriptions  Pending Prescriptions Disp Refills   fluconazole (DIFLUCAN) 150 MG tablet [Pharmacy Med Name: FLUCONAZOLE 150 MG TABLET] 2 tablet 0    Sig: TAKE ONE TABLET BY MOUTH ON DAY 1. REPEAT DOSE 2ND TABLET ON DAY 3.     Off-Protocol Failed - 04/12/2023  4:49 PM      Failed - Medication not assigned to a protocol, review manually.      Passed - Valid encounter within last 12 months    Recent Outpatient Visits           4 weeks ago Hypotension, unspecified hypotension type   [place] [facility] [place], Netta Neat, DO   1 month ago Acute non-recurrent frontal sinusitis   [place] [facility] [place], Netta Neat, DO       Future Appointments             In 2 weeks Althea Charon, Netta Neat, DO  [facility]   In 1 month Charlsie Quest, NP [facility] [place]

## 2023-04-14 ENCOUNTER — Other Ambulatory Visit: Payer: Self-pay | Admitting: Family Medicine

## 2023-04-14 DIAGNOSIS — J3089 Other allergic rhinitis: Secondary | ICD-10-CM

## 2023-04-16 NOTE — Telephone Encounter (Signed)
 Requested Prescriptions  Pending Prescriptions Disp Refills   fluticasone (FLONASE) 50 MCG/ACT nasal spray [Pharmacy Med Name: FLUTICASONE PROP 50 MCG SPRAY] 48 mL 0    Sig: SPRAY 2 SPRAYS INTO EACH NOSTRIL EVERY DAY     Ear, Nose, and Throat: Nasal Preparations - Corticosteroids Passed - 04/16/2023  8:02 AM      Passed - Valid encounter within last 12 months    Recent Outpatient Visits           1 month ago Hypotension, unspecified hypotension type   [facility] [place], Netta Neat, DO   2 months ago Acute non-recurrent frontal sinusitis   [place] [facility] [place], Netta Neat, DO       Future Appointments             In 2 weeks Althea Charon, Netta Neat, DO [facility]   In 4 weeks Charlsie Quest, NP [facility] [place]

## 2023-04-17 DIAGNOSIS — M461 Sacroiliitis, not elsewhere classified: Secondary | ICD-10-CM | POA: Diagnosis not present

## 2023-04-23 ENCOUNTER — Ambulatory Visit: Payer: Medicare HMO | Admitting: Cardiovascular Disease

## 2023-04-24 ENCOUNTER — Other Ambulatory Visit: Payer: Self-pay

## 2023-04-24 DIAGNOSIS — R7309 Other abnormal glucose: Secondary | ICD-10-CM | POA: Diagnosis not present

## 2023-04-24 DIAGNOSIS — E538 Deficiency of other specified B group vitamins: Secondary | ICD-10-CM | POA: Diagnosis not present

## 2023-04-24 DIAGNOSIS — D509 Iron deficiency anemia, unspecified: Secondary | ICD-10-CM

## 2023-04-24 DIAGNOSIS — Z0000 Encounter for general adult medical examination without abnormal findings: Secondary | ICD-10-CM | POA: Diagnosis not present

## 2023-04-24 DIAGNOSIS — E782 Mixed hyperlipidemia: Secondary | ICD-10-CM | POA: Diagnosis not present

## 2023-04-25 ENCOUNTER — Institutional Professional Consult (permissible substitution): Payer: Self-pay | Admitting: Plastic Surgery

## 2023-04-25 LAB — COMPLETE METABOLIC PANEL WITHOUT GFR
AG Ratio: 1.9 (calc) (ref 1.0–2.5)
ALT: 26 U/L (ref 6–29)
AST: 33 U/L (ref 10–35)
Albumin: 4 g/dL (ref 3.6–5.1)
Alkaline phosphatase (APISO): 43 U/L (ref 37–153)
BUN: 13 mg/dL (ref 7–25)
CO2: 31 mmol/L (ref 20–32)
Calcium: 8.8 mg/dL (ref 8.6–10.4)
Chloride: 105 mmol/L (ref 98–110)
Creat: 0.67 mg/dL (ref 0.50–1.05)
Globulin: 2.1 g/dL (ref 1.9–3.7)
Glucose, Bld: 90 mg/dL (ref 65–99)
Potassium: 4.3 mmol/L (ref 3.5–5.3)
Sodium: 142 mmol/L (ref 135–146)
Total Bilirubin: 0.6 mg/dL (ref 0.2–1.2)
Total Protein: 6.1 g/dL (ref 6.1–8.1)

## 2023-04-25 LAB — LIPID PANEL
Cholesterol: 136 mg/dL (ref <200)
HDL: 76 mg/dL (ref >=50)
LDL Cholesterol (Calc): 46 mg/dL
Non-HDL Cholesterol (Calc): 60 mg/dL (ref <130)
Total CHOL/HDL Ratio: 1.8 (calc) (ref <5.0)
Triglycerides: 59 mg/dL (ref <150)

## 2023-04-25 LAB — VITAMIN B12: Vitamin B-12: 1146 pg/mL — ABNORMAL HIGH (ref 200–1100)

## 2023-04-25 LAB — CBC WITH DIFFERENTIAL/PLATELET
Absolute Lymphocytes: 1719 {cells}/uL (ref 850–3900)
Absolute Monocytes: 279 {cells}/uL (ref 200–950)
Basophils Absolute: 32 {cells}/uL (ref 0–200)
Basophils Relative: 0.7 %
Eosinophils Absolute: 158 {cells}/uL (ref 15–500)
Eosinophils Relative: 3.5 %
HCT: 40.2 % (ref 35.0–45.0)
Hemoglobin: 13.1 g/dL (ref 11.7–15.5)
MCH: 30.3 pg (ref 27.0–33.0)
MCHC: 32.6 g/dL (ref 32.0–36.0)
MCV: 92.8 fL (ref 80.0–100.0)
MPV: 9.8 fL (ref 7.5–12.5)
Monocytes Relative: 6.2 %
Neutro Abs: 2313 {cells}/uL (ref 1500–7800)
Neutrophils Relative %: 51.4 %
Platelets: 190 10*3/uL (ref 140–400)
RBC: 4.33 10*6/uL (ref 3.80–5.10)
RDW: 12.3 % (ref 11.0–15.0)
Total Lymphocyte: 38.2 %
WBC: 4.5 10*3/uL (ref 3.8–10.8)

## 2023-04-25 LAB — IRON,TIBC AND FERRITIN PANEL
%SAT: 29 % (ref 16–45)
Ferritin: 61 ng/mL (ref 16–288)
Iron: 93 ug/dL (ref 45–160)
TIBC: 316 ug/dL (ref 250–450)

## 2023-04-25 LAB — HEMOGLOBIN A1C
Hgb A1c MFr Bld: 5.1 % (ref <5.7)
Mean Plasma Glucose: 100 mg/dL
eAG (mmol/L): 5.5 mmol/L

## 2023-04-25 LAB — TSH: TSH: 1.61 m[IU]/L (ref 0.40–4.50)

## 2023-04-29 ENCOUNTER — Encounter: Payer: Self-pay | Admitting: Family Medicine

## 2023-04-29 LAB — HM DEXA SCAN

## 2023-05-02 ENCOUNTER — Ambulatory Visit (INDEPENDENT_AMBULATORY_CARE_PROVIDER_SITE_OTHER): Payer: Self-pay | Admitting: Family Medicine

## 2023-05-02 ENCOUNTER — Encounter: Payer: Self-pay | Admitting: Family Medicine

## 2023-05-02 VITALS — BP 98/52 | HR 67 | Resp 16 | Ht 62.0 in | Wt 148.8 lb

## 2023-05-02 DIAGNOSIS — M5442 Lumbago with sciatica, left side: Secondary | ICD-10-CM

## 2023-05-02 DIAGNOSIS — E538 Deficiency of other specified B group vitamins: Secondary | ICD-10-CM

## 2023-05-02 DIAGNOSIS — R7309 Other abnormal glucose: Secondary | ICD-10-CM

## 2023-05-02 DIAGNOSIS — M5441 Lumbago with sciatica, right side: Secondary | ICD-10-CM

## 2023-05-02 DIAGNOSIS — E782 Mixed hyperlipidemia: Secondary | ICD-10-CM | POA: Diagnosis not present

## 2023-05-02 DIAGNOSIS — K219 Gastro-esophageal reflux disease without esophagitis: Secondary | ICD-10-CM | POA: Diagnosis not present

## 2023-05-02 DIAGNOSIS — R110 Nausea: Secondary | ICD-10-CM | POA: Diagnosis not present

## 2023-05-02 DIAGNOSIS — I739 Peripheral vascular disease, unspecified: Secondary | ICD-10-CM | POA: Diagnosis not present

## 2023-05-02 DIAGNOSIS — G8929 Other chronic pain: Secondary | ICD-10-CM

## 2023-05-02 DIAGNOSIS — D509 Iron deficiency anemia, unspecified: Secondary | ICD-10-CM

## 2023-05-02 DIAGNOSIS — G894 Chronic pain syndrome: Secondary | ICD-10-CM

## 2023-05-02 DIAGNOSIS — Z0000 Encounter for general adult medical examination without abnormal findings: Secondary | ICD-10-CM | POA: Diagnosis not present

## 2023-05-02 MED ORDER — OMEPRAZOLE 40 MG PO CPDR: 40.0000 mg | DELAYED_RELEASE_CAPSULE | Freq: Every day | ORAL | 3 refills | Status: AC

## 2023-05-02 MED ORDER — GABAPENTIN 600 MG PO TABS
600.0000 mg | ORAL_TABLET | Freq: Four times a day (QID) | ORAL | 3 refills | Status: AC
Start: 2023-05-02 — End: ?

## 2023-05-02 MED ORDER — ONDANSETRON 4 MG PO TBDP
4.0000 mg | ORAL_TABLET | Freq: Three times a day (TID) | ORAL | 2 refills | Status: DC | PRN
Start: 2023-05-02 — End: 2023-09-10

## 2023-05-02 NOTE — Progress Notes (Signed)
 Subjective:    Patient ID: Ashley Fields, female    DOB: 1955/04/04, 68 y.o.   MRN: [id number]  Ashley Fields is a 68 y.o. female presenting on 05/02/2023 for Annual Exam   HPI  Discussed the use of AI scribe software for clinical note transcription with the patient, who gave verbal consent to proceed.  History of Present Illness   Ashley Fields is a 68 year old female who presents for an annual physical exam.  She uses Valtrex  occasionally for flare-ups, has trazodone  and tizanidine  recently ordered, and uses rizatriptan  for migraines. She requested a refill for Zofran  due to its age. She takes omeprazole  40 mg and gabapentin  600 mg four times a day.      Left Knee Pain, Chronic Reports symptoms >3 months with Left inner Believes it may related to increased demand or strain on left knee while back was being treated. She follows with [facility] for pain management, she is on Oxycodone  7.5/325 for her back and spine and it does help the Left knee. Upcoming considering a Knee injection. - Also on Tizanidine , Gabapentin , Etolodac She wears inserts in shoes to help shock absorb. Not wearing knee brace or sleeve. The medication appears to provide some relief for both the back and knee pain.    History of Morbid Obesity Weight >300, in 2005 she had gastric bypass. History of Tirzepatide Compound. No longer on med. Continues to maintain weight loss stable 148 lbs She has issues with lower blood pressure and dehydration at times. Rehydration powder Add salt, Core drinks   She experiences low blood pressure, which she attributes to not eating or drinking enough, and dizziness when moving around. She has not been monitoring her blood pressure regularly and has not taken her fluid pill for almost a year.   Vitamin B12 Elevated lab >1000 due to injection of dose.   HYPERLIPIDEMIA: - Reports no concerns. Last lipid panel 04/2023, LDL controlled at 46 - Currently taking Zetia ,  Rosuvastatin  40mg , tolerating well without side effects or myalgias Followed by Cardiology Upcoming Cardiac Stress Test next month  Osteopenia   Anemia She has a history of low iron  levels but currently has stable labs saturation of 29%, which is an improvement from previous levels in the teens. Her ferritin is 60, and her iron  count is 93. Followed by Hematology w Iron  Infusion every 6 months   Lumbar Spine DDD S/p L5-S1 Anterior Spinal Fusion, per Dr Waymond Hailey - significant improvement overall.   Insomnia Tiredness. stays up reading    Health Maintenance:  Prevnar-20 06/2020 done   Completed Mammogram 09/07/22 negative, repeat yearly   Colonoscopy 06/13/20, Dr Tully Gainer AGI, next due 10 years. Reconsider Cologuard  Past Surgical History:  Procedure Laterality Date   5 miscarriages     1977-1985, Blood transfusion s/p miscarriage 1977   ABDOMINAL EXPOSURE N/A 12/18/2021   Procedure: ABDOMINAL EXPOSURE;  Surgeon: Young Hensen, MD;  Location: [facility] OR;  Service: Vascular;  Laterality: N/A;   ACDF C4-C7  02/10/2015   ANTERIOR LUMBAR FUSION N/A 12/18/2021   Procedure: Anterior Lumbar Interbody Fusion  - Lumbar Five-Sacral One;  Surgeon: Isadora Mar, MD;  Location: [facility] OR;  Service: Neurosurgery;  Laterality: N/A;   APPENDECTOMY  12/2008   AUGMENTATION MAMMAPLASTY Bilateral 2015   AUGMENTATION MAMMAPLASTY Bilateral 2018   implants redone w/ placement of implants under muscle   breast lift bilateral, implants  05/18/2015   bilateral, silicon naturel   BREAST REDUCTION SURGERY Bilateral  1997   CESAREAN SECTION  07/27/1981   Placenta Previa   COLONOSCOPY WITH PROPOFOL  N/A 06/13/2020   Procedure: COLONOSCOPY WITH PROPOFOL ;  Surgeon: Irby Mannan, MD;  Location: [facility] ENDOSCOPY;  Service: Endoscopy;  Laterality: N/A;   COSMETIC SURGERY     Breast implants w/ lift, tummy tuck, upper & lower Blepharop   DILATION AND CURETTAGE OF UTERUS     ESOPHAGOGASTRODUODENOSCOPY  (EGD) WITH PROPOFOL  N/A 06/13/2020   Procedure: ESOPHAGOGASTRODUODENOSCOPY (EGD) WITH PROPOFOL ;  Surgeon: Irby Mannan, MD;  Location: [facility] ENDOSCOPY;  Service: Endoscopy;  Laterality: N/A;   EXCISION OF ABDOMINAL WALL TUMOR N/A 04/08/2023   Procedure: EXCISION, NEOPLASM, ABDOMINAL WALL;  Surgeon: Eldred Grego, MD;  Location: [facility] ORS;  Service: General;  Laterality: N/A;   EYE SURGERY  2017   Cataract surgery & lasik   GASTRIC BYPASS  12/24/2003   IVC FILTER INSERTION  12/2003   TrapEase Vena Cava Filter   LAPAROSCOPIC CHOLECYSTECTOMY N/A 03/2004   LUMBAR LAMINECTOMY  06/2006   L4-L5 (spinal fusion)   mini tummy tuck  05/07/2016   Bilateral bra/back roll lift skin removal   REDUCTION MAMMAPLASTY Bilateral 1997   SHOULDER ACROMIOPLASTY Left 03/27/2013   w/ labral debridement   SPINAL CORD STIMULATOR IMPLANT  11/2010   removed 05/2011   TOTAL ABDOMINAL HYSTERECTOMY N/A 1987        03/14/2023   11:44 AM 10/31/2022    8:35 AM 10/15/2022   12:01 PM  Depression screen PHQ 2/9  Decreased Interest 1 1   Down, Depressed, Hopeless 0 1   PHQ - 2 Score 1 2   Altered sleeping 1 0   Tired, decreased energy 1 1   Change in appetite 0 0   Feeling bad or failure about yourself  0 0   Trouble concentrating 0 0   Moving slowly or fidgety/restless 0 0   Suicidal thoughts 0 0   PHQ-9 Score 3 3   Difficult doing work/chores Somewhat difficult Not difficult at all      Information is confidential and restricted. Go to Review Flowsheets to unlock data.       03/14/2023   11:44 AM 10/31/2022    8:35 AM 10/15/2022   12:02 PM 09/13/2022   11:31 AM  GAD 7 : Generalized Anxiety Score  Nervous, Anxious, on Edge 1 1    Control/stop worrying 0 0    Worry too much - different things 0 1    Trouble relaxing 1 1    Restless 0 0    Easily annoyed or irritable 1 1    Afraid - awful might happen 0 0    Total GAD 7 Score 3 4    Anxiety Difficulty Not difficult at all        Information  is confidential and restricted. Go to Review Flowsheets to unlock data.     Past Medical History:  Diagnosis Date   Abdominal wall seroma (postoperative) 2025   Allergy    Anxiety    Aortic atherosclerosis (HCC)    Arthritis    Back pain    a.) s/p L4-L5 fusion 2008; b.) s/p PLIF L2-L3 and ALIF L5-S1 12/2021   Bilateral carotid artery disease (HCC)    Bipolar disorder (HCC)    Blood transfusion without reported diagnosis 1977   Transfusions from miscarriage & hemorrhaging   CAD (coronary artery disease)    Chronic kidney disease    DDD (degenerative disc disease), cervical    a.) s/p  ACDF C4-C7   Depression    Disp fx of cuboid bone of right foot, init for clos fx 01/01/2017   Displacement of breast implant 2025   Fibromyalgia    GERD (gastroesophageal reflux disease)    Headache    Hepatitis C virus infection cured after antiviral drug therapy    a.) s/p Tx with ledipasvir/sofosbuvir   History of 2019 novel coronavirus disease (COVID-19) 01/23/2020   Hollenhorst plaque, right eye    Hyperlipidemia    Insomnia    a.) uses trazodone  PRN   Iron  deficiency anemia following bariatric surgery    Long-term use of aspirin  therapy    Lumbar radiculopathy    Neck pain    Neuromuscular disorder (HCC) 1997   Falling to much was an eye-opener   Osteoporosis    PSVT (paroxysmal supraventricular tachycardia) (HCC)    Systolic murmur    Thyroid  disease    Urine incontinence    Past Surgical History:  Procedure Laterality Date   5 miscarriages     1977-1985, Blood transfusion s/p miscarriage 1977   ABDOMINAL EXPOSURE N/A 12/18/2021   Procedure: ABDOMINAL EXPOSURE;  Surgeon: Young Hensen, MD;  Location[facility] OR;  Service: Vascular;  Laterality: N/A;   ACDF C4-C7  02/10/2015   ANTERIOR LUMBAR FUSION N/A 12/18/2021   Procedure: Anterior Lumbar Interbody Fusion  - Lumbar Five-Sacral One;  Surgeon: Isadora Mar, MD;  Location: [facility] OR;  Service: Neurosurgery;  Laterality: N/A;    APPENDECTOMY  12/2008   AUGMENTATION MAMMAPLASTY Bilateral 2015   AUGMENTATION MAMMAPLASTY Bilateral 2018   implants redone w/ placement of implants under muscle   breast lift bilateral, implants  05/18/2015   bilateral, silicon naturel   BREAST REDUCTION SURGERY Bilateral 1997   CESAREAN SECTION  07/27/1981   Placenta Previa   COLONOSCOPY WITH PROPOFOL  N/A 06/13/2020   Procedure: COLONOSCOPY WITH PROPOFOL ;  Surgeon: Irby Mannan, MD;  Location: [facility] ENDOSCOPY;  Service: Endoscopy;  Laterality: N/A;   COSMETIC SURGERY     Breast implants w/ lift, tummy tuck, upper & lower Blepharop   DILATION AND CURETTAGE OF UTERUS     ESOPHAGOGASTRODUODENOSCOPY (EGD) WITH PROPOFOL  N/A 06/13/2020   Procedure: ESOPHAGOGASTRODUODENOSCOPY (EGD) WITH PROPOFOL ;  Surgeon: Irby Mannan, MD;  Location: [facility] ENDOSCOPY;  Service: Endoscopy;  Laterality: N/A;   EXCISION OF ABDOMINAL WALL TUMOR N/A 04/08/2023   Procedure: EXCISION, NEOPLASM, ABDOMINAL WALL;  Surgeon: Eldred Grego, MD;  Location: [facility] ORS;  Service: General;  Laterality: N/A;   EYE SURGERY  2017   Cataract surgery & lasik   GASTRIC BYPASS  12/24/2003   IVC FILTER INSERTION  12/2003   TrapEase Vena Cava Filter   LAPAROSCOPIC CHOLECYSTECTOMY N/A 03/2004   LUMBAR LAMINECTOMY  06/2006   L4-L5 (spinal fusion)   mini tummy tuck  05/07/2016   Bilateral bra/back roll lift skin removal   REDUCTION MAMMAPLASTY Bilateral 1997   SHOULDER ACROMIOPLASTY Left 03/27/2013   w/ labral debridement   SPINAL CORD STIMULATOR IMPLANT  11/2010   removed 05/2011   TOTAL ABDOMINAL HYSTERECTOMY N/A 1987   Social History   Socioeconomic History   Marital status: Married    Spouse name: Jarita, Raval (Spouse) [phone] ([place])   Number of children: 1   Years of education: High School   Highest education level: GED or equivalent  Occupational History   Occupation: disability  Tobacco Use   Smoking status: Former    Current packs/day:  0.00    Average packs/day: 0.3 packs/day for  10.0 years (2.5 ttl pk-yrs)    Types: Cigarettes    Start date: 11/28/1985    Quit date: 11/29/1995    Years since quitting: 27.4    Passive exposure: Past   Smokeless tobacco: Former   Tobacco comments:    Quite 1997, didnt smoke hardly at all when I was smoking.  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]   Social Drivers of Health   Financial Resource Strain: Medium Risk (03/19/2023)   Received from [facility]   Overall Financial Resource Strain (CARDIA)    Difficulty of Paying Living Expenses: Somewhat hard  Food Insecurity: Food Insecurity Present (03/19/2023)   Received from [facility]   Hunger Vital Sign    Worried About Running Out of Food in the Last Year: Sometimes true    Ran Out of Food in the Last Year: Sometimes true  Transportation Needs: No Transportation Needs (03/19/2023)   Received from [facility] - Transportation    In the past 12 months, has lack of transportation kept you from medical appointments or from getting medications?: No    Lack of Transportation (Non-Medical): No  Physical Activity: Insufficiently Active (02/11/2023)   Exercise Vital Sign    Days of Exercise per Week: 4 days    Minutes of Exercise per Session: 20 min  Stress: Stress Concern Present (02/11/2023)   [employer] of Occupational [employer] - Occupational Stress Questionnaire    Feeling of Stress : To some extent  Social Connections: Unknown (02/11/2023)   Social Connection and Isolation Panel [NHANES]    Frequency of  Communication with Friends and Family: More than three times a week    Frequency of Social Gatherings with Friends and Family: Three times a week    Attends Religious Services: Patient declined    Active Member of Clubs or Organizations: No    Attends Banker Meetings: Never    Marital Status: Married  Catering manager Violence: Not At Risk (07/20/2022)   Humiliation, Afraid, Rape, and Kick questionnaire    Fear of Current or Ex-Partner: No    Emotionally Abused: No    Physically Abused: No    Sexually Abused: No   Family History  Problem Relation Age of Onset   COPD Mother    Lung cancer Mother 66   Diabetes Mother    Heart disease Mother    Stroke Mother    Heart attack Mother    Arthritis Mother    Hypertension Mother    Obesity Mother    Varicose Veins Mother    Heart disease Father    Heart attack Father 30   Early death Father    Depression Sister        x 5 sisters   COPD Sister    Hypertension Sister    Colon polyps Sister    Hearing loss Sister    Heart attack Sister    Arthritis Sister    Varicose Veins Sister    COPD Sister    Obesity Sister    Depression Sister  Depression Sister    Heart disease Sister    Hypertension Sister    Hypertension Sister    Obesity Sister    Heart disease Brother        x 3 brothers   Diabetes Brother    Heart attack Brother    Arthritis Brother    Heart disease Brother    Hypertension Brother    Diabetes Brother    Heart disease Brother    Hypertension Brother    Obesity Brother    Varicose Veins Brother    Heart disease Brother    Hypertension Brother    Obesity Brother    Miscarriages / Stillbirths Maternal Aunt    Miscarriages / Stillbirths Paternal Aunt    Breast cancer Paternal Aunt        early years   Cancer Maternal Grandfather    Stroke Maternal Grandfather    Current Outpatient Medications on File Prior to Visit  Medication Sig   aspirin  EC (ASPIRIN  LOW DOSE) 81 MG tablet TAKE 1  TABLET BY MOUTH EVERY DAY   Azelastine  HCl 137 MCG/SPRAY SOLN PLACE 2 SPRAYS INTO BOTH NOSTRILS 2 (TWO) TIMES DAILY. USE IN EACH NOSTRIL AS DIRECTED   bisacodyl  (DULCOLAX) 5 MG EC tablet Take 5 mg by mouth daily as needed for moderate constipation.   Cholecalciferol 250 MCG (10000 UT) CAPS Take 10,000 Units by mouth once a week.   cyanocobalamin  (VITAMIN B12) 1000 MCG/ML injection INJECT 1 ML (1,000 MCG TOTAL) INTO THE MUSCLE EVERY 30 DAYS.   diclofenac Sodium (VOLTAREN) 1 % GEL Apply 2 g topically 4 (four) times daily as needed (pain).   estradiol  (ESTRACE ) 1 MG tablet TAKE 1 TABLET BY MOUTH EVERY DAY   etodolac  (LODINE ) 500 MG tablet Take 1 tablet by mouth 2 (two) times daily.   ezetimibe  (ZETIA ) 10 MG tablet TAKE 1 TABLET BY MOUTH EVERY DAY   fluticasone  (FLONASE ) 50 MCG/ACT nasal spray SPRAY 2 SPRAYS INTO EACH NOSTRIL EVERY DAY   furosemide  (LASIX ) 20 MG tablet TAKE 1 TABLET ONE TIME DAILY AS NEEDED FOR FLUID OR EDEMA   hydrOXYzine  (VISTARIL ) 50 MG capsule Take 1 capsule (50 mg total) by mouth 2 (two) times daily as needed for anxiety.   Oxcarbazepine  (TRILEPTAL ) 300 MG tablet TAKE 1 TABLET BY MOUTH 2 TIMES DAILY.   oxyCODONE -acetaminophen  (PERCOCET) 7.5-325 MG tablet Take 1 tablet by mouth every 6 (six) hours as needed for severe pain.   rosuvastatin  (CRESTOR ) 40 MG tablet TAKE 1 TABLET BY MOUTH EVERY DAY   tiZANidine  (ZANAFLEX ) 4 MG tablet TAKE 1 TABLET BY MOUTH EVERY 6 HOURS   traZODone  (DESYREL ) 100 MG tablet Take 1-2 tablets (100-200 mg total) by mouth at bedtime as needed for sleep. Has supplies   triamcinolone  ointment (KENALOG ) 0.5 % Apply 1 Application topically 2 (two) times daily. Apply to external ear for 2 weeks   venlafaxine  XR (EFFEXOR  XR) 150 MG 24 hr capsule Take 1 capsule (150 mg total) by mouth daily with breakfast. DOSE INCREASE   butalbital -acetaminophen -caffeine  (FIORICET) 50-325-40 MG tablet Take 1 tablet by mouth 2 (two) times daily as needed for headache. (Patient not  taking: Reported on 05/02/2023)   rizatriptan  (MAXALT -MLT) 10 MG disintegrating tablet Take 1 tablet (10 mg total) by mouth as needed for migraine. May repeat in 2 hours if needed, that is max dose for 24 hours. (Patient not taking: Reported on 05/02/2023)   valACYclovir  (VALTREX ) 1000 MG tablet TAKE 1 TABLET (1,000 MG TOTAL) BY MOUTH DAILY. FOR  5-7 DAYS, MAY REPEAT IF NEED FOR COLD SORE (Patient not taking: Reported on 05/02/2023)   Zoledronic  Acid (ZOMETA ) 4 MG/100ML IVPB Once per year from Orthopedic (Patient not taking: Reported on 05/02/2023)   No current facility-administered medications on file prior to visit.    Review of Systems  Constitutional:  Negative for activity change, appetite change, chills, diaphoresis, fatigue and fever.  HENT:  Negative for congestion and hearing loss.   Eyes:  Negative for visual disturbance.  Respiratory:  Negative for cough, chest tightness, shortness of breath and wheezing.   Cardiovascular:  Negative for chest pain, palpitations and leg swelling.  Gastrointestinal:  Negative for abdominal pain, constipation, diarrhea, nausea and vomiting.  Genitourinary:  Negative for dysuria, frequency and hematuria.  Musculoskeletal:  Negative for arthralgias and neck pain.  Skin:  Negative for rash.  Neurological:  Negative for dizziness, weakness, light-headedness, numbness and headaches.  Hematological:  Negative for adenopathy.  Psychiatric/Behavioral:  Negative for behavioral problems, dysphoric mood and sleep disturbance.    Per HPI unless specifically indicated above     Objective:    BP (!) 98/52 (BP Location: Right Arm, Patient Position: Sitting, Cuff Size: Normal)   Pulse 67   Resp 16   Ht 5\' 2"  (1.575 m)   Wt 148 lb 12.8 oz (67.5 kg)   SpO2 97%   BMI 27.22 kg/m   Wt Readings from Last 3 Encounters:  05/02/23 148 lb 12.8 oz (67.5 kg)  04/08/23 146 lb (66.2 kg)  04/01/23 149 lb 12.8 oz (67.9 kg)    Physical Exam Vitals and nursing note  reviewed.  Constitutional:      General: She is not in acute distress.    Appearance: She is well-developed. She is not diaphoretic.     Comments: Well-appearing, comfortable, cooperative  HENT:     Head: Normocephalic and atraumatic.  Eyes:     General:        Right eye: No discharge.        Left eye: No discharge.     Conjunctiva/sclera: Conjunctivae normal.     Pupils: Pupils are equal, round, and reactive to light.  Neck:     Thyroid : No thyromegaly.  Cardiovascular:     Rate and Rhythm: Normal rate and regular rhythm.     Pulses: Normal pulses.     Heart sounds: Normal heart sounds. No murmur heard. Pulmonary:     Effort: Pulmonary effort is normal. No respiratory distress.     Breath sounds: Normal breath sounds. No wheezing or rales.  Abdominal:     General: Bowel sounds are normal. There is no distension.     Palpations: Abdomen is soft. There is no mass.     Tenderness: There is no abdominal tenderness.     Comments: Abdominal scar healing.  Musculoskeletal:        General: No tenderness. Normal range of motion.     Cervical back: Normal range of motion and neck supple.     Right lower leg: No edema.     Left lower leg: No edema.     Comments: Upper / Lower Extremities: - Normal muscle tone, strength bilateral upper extremities 5/5, lower extremities 5/5  Lymphadenopathy:     Cervical: No cervical adenopathy.  Skin:    General: Skin is warm and dry.     Findings: No erythema or rash.  Neurological:     Mental Status: She is alert and oriented to person, place, and time.  Comments: Distal sensation intact to light touch all extremities  Psychiatric:        Mood and Affect: Mood normal.        Behavior: Behavior normal.        Thought Content: Thought content normal.     Comments: Well groomed, good eye contact, normal speech and thoughts     Results for orders placed or performed in visit on 04/24/23  Iron , TIBC and Ferritin Panel   Collection Time:  04/24/23  7:56 AM  Result Value Ref Range   Iron  93 45 - 160 mcg/dL   TIBC 161 096 - 045 mcg/dL (calc)   %SAT 29 16 - 45 % (calc)   Ferritin 61 16 - 288 ng/mL  Vitamin B12   Collection Time: 04/24/23  7:56 AM  Result Value Ref Range   Vitamin B-12 1,146 (H) 200 - 1,100 pg/mL  TSH   Collection Time: 04/24/23  7:56 AM  Result Value Ref Range   TSH 1.61 0.40 - 4.50 mIU/L  CBC with Differential/Platelet   Collection Time: 04/24/23  7:56 AM  Result Value Ref Range   WBC 4.5 3.8 - 10.8 Thousand/uL   RBC 4.33 3.80 - 5.10 Million/uL   Hemoglobin 13.1 11.7 - 15.5 g/dL   HCT 40.9 81.1 - 91.4 %   MCV 92.8 80.0 - 100.0 fL   MCH 30.3 27.0 - 33.0 pg   MCHC 32.6 32.0 - 36.0 g/dL   RDW 78.2 95.6 - 21.3 %   Platelets 190 140 - 400 Thousand/uL   MPV 9.8 7.5 - 12.5 fL   Neutro Abs 2,313 1,500 - 7,800 cells/uL   Absolute Lymphocytes 1,719 850 - 3,900 cells/uL   Absolute Monocytes 279 200 - 950 cells/uL   Eosinophils Absolute 158 15 - 500 cells/uL   Basophils Absolute 32 0 - 200 cells/uL   Neutrophils Relative % 51.4 %   Total Lymphocyte 38.2 %   Monocytes Relative 6.2 %   Eosinophils Relative 3.5 %   Basophils Relative 0.7 %  COMPLETE METABOLIC PANEL WITH GFR   Collection Time: 04/24/23  7:56 AM  Result Value Ref Range   Glucose, Bld 90 65 - 99 mg/dL   BUN 13 7 - 25 mg/dL   Creat 0.86 5.78 - 4.69 mg/dL   BUN/Creatinine Ratio SEE NOTE: 6 - 22 (calc)   Sodium 142 135 - 146 mmol/L   Potassium 4.3 3.5 - 5.3 mmol/L   Chloride 105 98 - 110 mmol/L   CO2 31 20 - 32 mmol/L   Calcium  8.8 8.6 - 10.4 mg/dL   Total Protein 6.1 6.1 - 8.1 g/dL   Albumin 4.0 3.6 - 5.1 g/dL   Globulin 2.1 1.9 - 3.7 g/dL (calc)   AG Ratio 1.9 1.0 - 2.5 (calc)   Total Bilirubin 0.6 0.2 - 1.2 mg/dL   Alkaline phosphatase (APISO) 43 37 - 153 U/L   AST 33 10 - 35 U/L   ALT 26 6 - 29 U/L  Lipid panel   Collection Time: 04/24/23  7:56 AM  Result Value Ref Range   Cholesterol 136 <200 mg/dL   HDL 76 > OR = 50 mg/dL    Triglycerides 59 <629 mg/dL   LDL Cholesterol (Calc) 46 mg/dL (calc)   Total CHOL/HDL Ratio 1.8 <5.0 (calc)   Non-HDL Cholesterol (Calc) 60 <528 mg/dL (calc)  Hemoglobin U1L   Collection Time: 04/24/23  7:56 AM  Result Value Ref Range   Hgb A1c MFr Bld 5.1 <5.7 %  Mean Plasma Glucose 100 mg/dL   eAG (mmol/L) 5.5 mmol/L   *Note: Due to a large number of results and/or encounters for the requested time period, some results have not been displayed. A complete set of results can be found in Results Review.      Assessment & Plan:   Problem List Items Addressed This Visit     Chronic Low back pain (Primary Area of Pain) (Bilateral)  (R>L) (Chronic)   Relevant Medications   gabapentin  (NEURONTIN ) 600 MG tablet   Chronic pain syndrome (Chronic)   Relevant Medications   gabapentin  (NEURONTIN ) 600 MG tablet   Mixed hyperlipidemia   PAD (peripheral artery disease) (HCC)   Other Visit Diagnoses       Annual physical exam    -  Primary     Iron  deficiency anemia, unspecified iron  deficiency anemia type         Vitamin B12 deficiency         Elevated hemoglobin A1c         Nausea       Relevant Medications   ondansetron  (ZOFRAN  ODT) 4 MG disintegrating tablet     Gastro-esophageal reflux disease without esophagitis       Relevant Medications   ondansetron  (ZOFRAN  ODT) 4 MG disintegrating tablet   omeprazole  (PRILOSEC) 40 MG capsule        Updated Health Maintenance information Reviewed recent lab results with patient Encouraged improvement to lifestyle with diet and exercise Goal of weight loss   Wellness Visit Annual wellness visit with all routine screenings and vaccinations up to date. Lab results normal except for slightly elevated B12 due to recent injection. Current medications and refills discussed. - Continue current medication regimen. - Refill Zofran  for nausea. - Refill omeprazole  for gastroesophageal reflux disease. - Refill gabapentin  for neuropathic  pain. - Encourage home blood pressure monitoring. - Advise maintaining hydration and salt intake to manage hypotension.  Hypotension Blood pressure low, likely due to dehydration and inadequate food intake. Symptoms include fatigue and orthostatic dizziness. Discussed increasing fluid and salt intake. - Encourage increased fluid and salt intake. - Advise home blood pressure monitoring.  Calcium  deficiency Calcium  levels well-managed with current supplementation. - Continue calcium  supplementation.  Sleep disturbance Insufficient sleep due to late-night reading. Discussed impact on sleep quality and energy levels. - Encourage setting a consistent bedtime. - Limit nighttime reading to improve sleep duration.         No orders of the defined types were placed in this encounter.   Meds ordered this encounter  Medications   ondansetron  (ZOFRAN  ODT) 4 MG disintegrating tablet    Sig: Take 1 tablet (4 mg total) by mouth every 8 (eight) hours as needed for nausea or vomiting.    Dispense:  30 tablet    Refill:  2   omeprazole  (PRILOSEC) 40 MG capsule    Sig: Take 1 capsule (40 mg total) by mouth daily.    Dispense:  90 capsule    Refill:  3    Add refills   gabapentin  (NEURONTIN ) 600 MG tablet    Sig: Take 1 tablet (600 mg total) by mouth in the morning, at noon, in the evening, and at bedtime.    Dispense:  360 tablet    Refill:  3     Follow up plan: Return in about 6 months (around 11/01/2023) for 6 month follow-up updates anemia Hematology.  Domingo Friend, DO [facility] 05/02/2023, 9:31  AM

## 2023-05-02 NOTE — Patient Instructions (Addendum)
 Thank you for coming to the office today.  Keep up the good work!  Please schedule a Follow-up Appointment to: Return in about 6 months (around 11/01/2023) for 6 month follow-up updates anemia Hematology.  If you have any other questions or concerns, please feel free to call the office or send a message through MyChart. You may also schedule an earlier appointment if necessary.  Additionally, you may be receiving a survey about your experience at our office within a few days to 1 week by e-mail or mail. We value your feedback.  Domingo Friend, DO [facility], [place]

## 2023-05-05 ENCOUNTER — Other Ambulatory Visit: Payer: Self-pay | Admitting: Cardiovascular Disease

## 2023-05-08 ENCOUNTER — Encounter: Payer: Self-pay | Admitting: Family Medicine

## 2023-05-09 ENCOUNTER — Other Ambulatory Visit: Payer: Self-pay

## 2023-05-09 MED ORDER — FEXOFENADINE HCL 180 MG PO TABS
180.0000 mg | ORAL_TABLET | Freq: Every day | ORAL | Status: AC
Start: 2023-05-09 — End: ?

## 2023-05-14 ENCOUNTER — Ambulatory Visit: Admitting: Cardiology

## 2023-05-20 ENCOUNTER — Ambulatory Visit: Admitting: Cardiology

## 2023-05-23 ENCOUNTER — Ambulatory Visit

## 2023-05-27 ENCOUNTER — Telehealth: Admitting: Psychiatry

## 2023-05-27 ENCOUNTER — Encounter: Payer: Self-pay | Admitting: Family Medicine

## 2023-05-27 ENCOUNTER — Encounter: Payer: Self-pay | Admitting: Psychiatry

## 2023-05-27 DIAGNOSIS — G4701 Insomnia due to medical condition: Secondary | ICD-10-CM

## 2023-05-27 DIAGNOSIS — F4310 Post-traumatic stress disorder, unspecified: Secondary | ICD-10-CM | POA: Diagnosis not present

## 2023-05-27 DIAGNOSIS — F410 Panic disorder [episodic paroxysmal anxiety] without agoraphobia: Secondary | ICD-10-CM

## 2023-05-27 DIAGNOSIS — F3161 Bipolar disorder, current episode mixed, mild: Secondary | ICD-10-CM

## 2023-05-27 MED ORDER — OXCARBAZEPINE 300 MG PO TABS: 750.0000 mg | ORAL_TABLET | ORAL | 1 refills | Status: DC

## 2023-05-27 NOTE — Progress Notes (Signed)
 Virtual Visit via Video Note  I connected with Ashley Fields on 05/27/23 at  1:20 PM EDT by a video enabled telemedicine application and verified that I am speaking with the correct person using two identifiers.  Location Provider Location : [facility] Patient Location : Car  Participants: Patient , Provider    I discussed the limitations of evaluation and management by telemedicine and the availability of in person appointments. The patient expressed understanding and agreed to proceed.   I discussed the assessment and treatment plan with the patient. The patient was provided an opportunity to ask questions and all were answered. The patient agreed with the plan and demonstrated an understanding of the instructions.   The patient was advised to call back or seek an in-person evaluation if the symptoms worsen or if the condition fails to improve as anticipated.   BH MD OP Progress Note  05/27/2023 2:48 PM Ashley Fields  MRN:  [id number]  Chief Complaint:  Chief Complaint  Patient presents with   Follow-up   Depression   Anxiety   Medication Refill   Discussed the use of AI scribe software for clinical note transcription with the patient, who gave verbal consent to proceed.  History of Present Illness Ashley Fields is a 68 year old Caucasian female, married, on disability, lives in [place], has a history of bipolar disorder, PTSD, panic disorder, insomnia, chronic pain, migraine headaches, history of laparoscopic gastric bypass was evaluated by telemedicine today.  Patient presents with mood disturbances and sleep issues.  She has been experiencing significant mood disturbances over the past few weeks, characterized by irritability and a desire to isolate herself. Frequent arguments with her husband have occurred, which she attributes to her mood. The anniversary of her sister Ashley Fields's death, which occurred six years ago, has been particularly difficult, leading to increased emotional  distress.  Denies suicidal thoughts or thoughts of harming others.  She has an upcoming appointment with a therapist, Miss Junior Olea, scheduled for June 19, 2023, which will be her first visit.  She reports ongoing sleep disturbances, stating difficulty in going to sleep and often delaying taking her nighttime medication, trazodone , which helps her sleep. There is a change in her sleep pattern, noting that she used to wake up at 4:30 AM but now sleeps until 9 or 10 AM, leaving her feeling 'dragging' throughout the day. She does not have a consistent bedtime routine.  She is currently taking oxcarbazepine  300 mg twice daily, with a previous increase from once to twice daily.   In her social history, she shares a supportive relationship with her siblings, particularly with her sister who is a year younger. She mentions having baby goats, which she tends to when needing a break from her husband. She also shares that she got a tattoo in memory of her sister Ashley Fields, who passed away in the same year as another sibling, both of whom were her favorites.    Visit Diagnosis:    ICD-10-CM   1. Bipolar 1 disorder, mixed, mild (HCC)  F31.61 Oxcarbazepine  (TRILEPTAL ) 300 MG tablet    2. PTSD (post-traumatic stress disorder)  F43.10     3. Panic disorder  F41.0 Oxcarbazepine  (TRILEPTAL ) 300 MG tablet    4. Insomnia due to medical condition  G47.01 Oxcarbazepine  (TRILEPTAL ) 300 MG tablet   pain, mood ,lack of sleep hygiene      Past Psychiatric History: I have reviewed past psychiatric history from progress note on 05/15/2018.  Past trials of Prozac,  Wellbutrin, Lexapro, Cymbalta, Rexulti, Ambien -nightmares, Lunesta , Lamictal , risperidone .  Past Medical History:  Past Medical History:  Diagnosis Date   Abdominal wall seroma (postoperative) 2025   Allergy    Anxiety    Aortic atherosclerosis (HCC)    Arthritis    Back pain    a.) s/p L4-L5 fusion 2008; b.) s/p PLIF L2-L3 and ALIF L5-S1 12/2021    Bilateral carotid artery disease (HCC)    Bipolar disorder (HCC)    Blood transfusion without reported diagnosis 1977   Transfusions from miscarriage & hemorrhaging   CAD (coronary artery disease)    Chronic kidney disease    DDD (degenerative disc disease), cervical    a.) s/p ACDF C4-C7   Depression    Disp fx of cuboid bone of right foot, init for clos fx 01/01/2017   Displacement of breast implant 2025   Fibromyalgia    GERD (gastroesophageal reflux disease)    Headache    Hepatitis C virus infection cured after antiviral drug therapy    a.) s/p Tx with ledipasvir/sofosbuvir   History of 2019 novel coronavirus disease (COVID-19) 01/23/2020   Hollenhorst plaque, right eye    Hyperlipidemia    Insomnia    a.) uses trazodone  PRN   Iron  deficiency anemia following bariatric surgery    Long-term use of aspirin  therapy    Lumbar radiculopathy    Neck pain    Neuromuscular disorder (HCC) 1997   Falling to much was an eye-opener   Osteoporosis    PSVT (paroxysmal supraventricular tachycardia) (HCC)    Systolic murmur    Thyroid  disease    Urine incontinence     Past Surgical History:  Procedure Laterality Date   5 miscarriages     1977-1985, Blood transfusion s/p miscarriage 1977   ABDOMINAL EXPOSURE N/A 12/18/2021   Procedure: ABDOMINAL EXPOSURE;  Surgeon: Young Hensen, MD;  Location: [facility] OR;  Service: Vascular;  Laterality: N/A;   ACDF C4-C7  02/10/2015   ANTERIOR LUMBAR FUSION N/A 12/18/2021   Procedure: Anterior Lumbar Interbody Fusion  - Lumbar Five-Sacral One;  Surgeon: Isadora Mar, MD;  Location: [facility] OR;  Service: Neurosurgery;  Laterality: N/A;   APPENDECTOMY  12/2008   AUGMENTATION MAMMAPLASTY Bilateral 2015   AUGMENTATION MAMMAPLASTY Bilateral 2018   implants redone w/ placement of implants under muscle   breast lift bilateral, implants  05/18/2015   bilateral, silicon naturel   BREAST REDUCTION SURGERY Bilateral 1997   CESAREAN SECTION  07/27/1981    Placenta Previa   COLONOSCOPY WITH PROPOFOL  N/A 06/13/2020   Procedure: COLONOSCOPY WITH PROPOFOL ;  Surgeon: Irby Mannan, MD;  Location: [facility] ENDOSCOPY;  Service: Endoscopy;  Laterality: N/A;   COSMETIC SURGERY     Breast implants w/ lift, tummy tuck, upper & lower Blepharop   DILATION AND CURETTAGE OF UTERUS     ESOPHAGOGASTRODUODENOSCOPY (EGD) WITH PROPOFOL  N/A 06/13/2020   Procedure: ESOPHAGOGASTRODUODENOSCOPY (EGD) WITH PROPOFOL ;  Surgeon: Irby Mannan, MD;  Location: [facility] ENDOSCOPY;  Service: Endoscopy;  Laterality: N/A;   EXCISION OF ABDOMINAL WALL TUMOR N/A 04/08/2023   Procedure: EXCISION, NEOPLASM, ABDOMINAL WALL;  Surgeon: Eldred Grego, MD;  Location: [facility] ORS;  Service: General;  Laterality: N/A;   EYE SURGERY  2017   Cataract surgery & lasik   GASTRIC BYPASS  12/24/2003   IVC FILTER INSERTION  12/2003   TrapEase Vena Cava Filter   LAPAROSCOPIC CHOLECYSTECTOMY N/A 03/2004   LUMBAR LAMINECTOMY  06/2006   L4-L5 (spinal fusion)   mini tummy  tuck  05/07/2016   Bilateral bra/back roll lift skin removal   REDUCTION MAMMAPLASTY Bilateral 1997   SHOULDER ACROMIOPLASTY Left 03/27/2013   w/ labral debridement   SPINAL CORD STIMULATOR IMPLANT  11/2010   removed 05/2011   TOTAL ABDOMINAL HYSTERECTOMY N/A 1987    Family Psychiatric History: I have reviewed family psychiatric history from progress note on 05/15/2018.  Family History:  Family History  Problem Relation Age of Onset   COPD Mother    Lung cancer Mother 20   Diabetes Mother    Heart disease Mother    Stroke Mother    Heart attack Mother    Arthritis Mother    Hypertension Mother    Obesity Mother    Varicose Veins Mother    Heart disease Father    Heart attack Father 88   Early death Father    Depression Sister        x 5 sisters   COPD Sister    Hypertension Sister    Colon polyps Sister    Hearing loss Sister    Heart attack Sister    Arthritis Sister    Varicose Veins Sister     COPD Sister    Obesity Sister    Depression Sister    Depression Sister    Heart disease Sister    Hypertension Sister    Hypertension Sister    Obesity Sister    Heart disease Brother        x 3 brothers   Diabetes Brother    Heart attack Brother    Arthritis Brother    Heart disease Brother    Hypertension Brother    Diabetes Brother    Heart disease Brother    Hypertension Brother    Obesity Brother    Varicose Veins Brother    Heart disease Brother    Hypertension Brother    Obesity Brother    Miscarriages / Stillbirths Maternal Aunt    Miscarriages / Stillbirths Paternal Aunt    Breast cancer Paternal Aunt        early years   Cancer Maternal Grandfather    Stroke Maternal Grandfather     Social History: I have reviewed social history from progress note on 05/15/2018. Social History   Socioeconomic History   Marital status: Married    Spouse name: Preslei, Blakley (Spouse) [phone] ([place])   Number of children: 1   Years of education: High School   Highest education level: GED or equivalent  Occupational History   Occupation: disability  Tobacco Use   Smoking status: Former    Current packs/day: 0.00    Average packs/day: 0.3 packs/day for 10.0 years (2.5 ttl pk-yrs)    Types: Cigarettes    Start date: 11/28/1985    Quit date: 11/29/1995    Years since quitting: 27.5    Passive exposure: Past   Smokeless tobacco: Former   Tobacco comments:    Quite 1997, didnt smoke hardly at all when I was smoking.  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]   Social Drivers of Corporate investment banker  Strain: Medium Risk (03/19/2023)   Received from [name]   Overall Financial Resource Strain (CARDIA)    Difficulty of Paying Living Expenses: Somewhat hard  Food Insecurity: Food Insecurity Present (03/19/2023)   [facility]   Hunger Vital Sign    Worried About Running Out of Food in the Last Year: Sometimes true    Ran Out of Food in the Last Year: Sometimes true  Transportation Needs: No Transportation Needs (03/19/2023)   Received from [facility] - Transportation    In the past 12 months, has lack of transportation kept you from medical appointments or from getting medications?: No    Lack of Transportation (Non-Medical): No  Physical Activity: Insufficiently Active (02/11/2023)   Exercise Vital Sign    Days of Exercise per Week: 4 days    Minutes of Exercise per Session: 20 min  Stress: Stress Concern Present (02/11/2023)   [employer] of Occupational Health - Occupational Stress Questionnaire    Feeling of Stress : To some extent  Social Connections: Unknown (02/11/2023)   Social Connection and Isolation Panel [NHANES]    Frequency of Communication with Friends and Family: More than three times a week    Frequency of Social Gatherings with Friends and Family: Three times a week    Attends Religious Services: Patient declined    Active Member of Clubs or Organizations: No    Attends Banker Meetings: Never    Marital Status: Married    Allergies:  Allergies  Allergen Reactions   Flagyl [Metronidazole] Anaphylaxis   Cymbalta [Duloxetine Hcl] Anxiety   Tape Rash    Paper tape okay to use per patient.    Metabolic Disorder Labs: Lab Results  Component Value Date   HGBA1C 5.1 04/24/2023   MPG 100 04/24/2023   MPG 108 05/01/2022   No results found for: "PROLACTIN" Lab Results  Component Value Date   CHOL 136 04/24/2023   TRIG 59 04/24/2023   HDL 76 04/24/2023   CHOLHDL 1.8  04/24/2023   LDLCALC 46 04/24/2023   LDLCALC 43 05/01/2022   Lab Results  Component Value Date   TSH 1.61 04/24/2023   TSH 2.11 05/01/2022    Therapeutic Level Labs: No results found for: "LITHIUM" No results found for: "VALPROATE" No results found for: "CBMZ"  Current Medications: Current Outpatient Medications  Medication Sig Dispense Refill   Oxcarbazepine  (TRILEPTAL ) 300 MG tablet Take 2.5 tablets (750 mg total) by mouth as directed. Take 1 tablet daily at 8 AM and half tablet at noon and 1 tablet daily at 8 PM 75 tablet 1   aspirin  EC (ASPIRIN  LOW DOSE) 81 MG tablet TAKE 1 TABLET BY MOUTH EVERY DAY 90 tablet 0   Azelastine  HCl 137 MCG/SPRAY SOLN PLACE 2 SPRAYS INTO BOTH NOSTRILS 2 (TWO) TIMES DAILY. USE IN EACH NOSTRIL AS DIRECTED 48 mL 0   bisacodyl  (DULCOLAX) 5 MG EC tablet Take 5 mg by mouth daily as needed for moderate constipation.     butalbital -acetaminophen -caffeine  (FIORICET) 50-325-40 MG tablet Take 1 tablet by mouth 2 (two) times daily as needed for headache. (Patient not taking: Reported on 05/02/2023)     Cholecalciferol 250 MCG (10000 UT) CAPS Take 10,000 Units by mouth once a week.     cyanocobalamin  (VITAMIN B12) 1000 MCG/ML injection INJECT 1 ML (1,000 MCG TOTAL) INTO THE MUSCLE EVERY 30 DAYS. 3 mL 3   diclofenac Sodium (VOLTAREN) 1 % GEL  Apply 2 g topically 4 (four) times daily as needed (pain).     estradiol  (ESTRACE ) 1 MG tablet TAKE 1 TABLET BY MOUTH EVERY DAY 90 tablet 0   etodolac  (LODINE ) 500 MG tablet Take 1 tablet by mouth 2 (two) times daily.     ezetimibe  (ZETIA ) 10 MG tablet TAKE 1 TABLET BY MOUTH EVERY DAY 90 tablet 3   fexofenadine  (ALLEGRA ) 180 MG tablet Take 1 tablet (180 mg total) by mouth daily.     fluticasone  (FLONASE ) 50 MCG/ACT nasal spray SPRAY 2 SPRAYS INTO EACH NOSTRIL EVERY DAY 48 mL 0   furosemide  (LASIX ) 20 MG tablet TAKE 1 TABLET ONE TIME DAILY AS NEEDED FOR FLUID OR EDEMA 90 tablet 1   gabapentin  (NEURONTIN ) 600 MG tablet Take 1  tablet (600 mg total) by mouth in the morning, at noon, in the evening, and at bedtime. 360 tablet 3   hydrOXYzine  (VISTARIL ) 50 MG capsule Take 1 capsule (50 mg total) by mouth 2 (two) times daily as needed for anxiety. 180 capsule 1   omeprazole  (PRILOSEC) 40 MG capsule Take 1 capsule (40 mg total) by mouth daily. 90 capsule 3   ondansetron  (ZOFRAN  ODT) 4 MG disintegrating tablet Take 1 tablet (4 mg total) by mouth every 8 (eight) hours as needed for nausea or vomiting. 30 tablet 2   oxyCODONE -acetaminophen  (PERCOCET) 7.5-325 MG tablet Take 1 tablet by mouth every 6 (six) hours as needed for severe pain.     rizatriptan  (MAXALT -MLT) 10 MG disintegrating tablet Take 1 tablet (10 mg total) by mouth as needed for migraine. May repeat in 2 hours if needed, that is max dose for 24 hours. (Patient not taking: Reported on 05/02/2023) 10 tablet 3   rosuvastatin  (CRESTOR ) 40 MG tablet TAKE 1 TABLET BY MOUTH EVERY DAY 90 tablet 3   tiZANidine  (ZANAFLEX ) 4 MG tablet TAKE 1 TABLET BY MOUTH EVERY 6 HOURS 120 tablet 5   traZODone  (DESYREL ) 100 MG tablet Take 1-2 tablets (100-200 mg total) by mouth at bedtime as needed for sleep. Has supplies     triamcinolone  ointment (KENALOG ) 0.5 % Apply 1 Application topically 2 (two) times daily. Apply to external ear for 2 weeks 30 g 2   valACYclovir  (VALTREX ) 1000 MG tablet TAKE 1 TABLET (1,000 MG TOTAL) BY MOUTH DAILY. FOR 5-7 DAYS, MAY REPEAT IF NEED FOR COLD SORE (Patient not taking: Reported on 05/02/2023) 90 tablet 1   venlafaxine  XR (EFFEXOR  XR) 150 MG 24 hr capsule Take 1 capsule (150 mg total) by mouth daily with breakfast. DOSE INCREASE 90 capsule 0   Zoledronic  Acid (ZOMETA ) 4 MG/100ML IVPB Once per year from Orthopedic (Patient not taking: Reported on 05/02/2023) 100 mL    No current facility-administered medications for this visit.     Musculoskeletal: Strength & Muscle Tone: UTA Gait & Station: Seated Patient leans: N/A  Psychiatric Specialty Exam: Review  of Systems  Psychiatric/Behavioral:  Positive for decreased concentration, dysphoric mood and sleep disturbance. The patient is nervous/anxious.     There were no vitals taken for this visit.There is no height or weight on file to calculate BMI.  General Appearance: Casual  Eye Contact:  Fair  Speech:  Clear and Coherent  Volume:  Normal  Mood:  Anxious, Depressed, and Irritable  Affect:  Congruent  Thought Process:  Goal Directed and Descriptions of Associations: Intact  Orientation:  Full (Time, Place, and Person)  Thought Content: Logical   Suicidal Thoughts:  No  Homicidal Thoughts:  No  Memory:  Immediate;   Fair Recent;   Fair Remote;   Fair  Judgement:  Fair  Insight:  Fair  Psychomotor Activity:  Normal  Concentration:  Concentration: Fair and Attention Span: Fair  Recall:  [employer] of Knowledge: Fair  Language: Fair  Akathisia:  No  Handed:  Right  AIMS (if indicated): not done  Assets:  Desire for Improvement Financial Resources/Insurance Housing Intimacy Social Support Talents/Skills Transportation  ADL's:  Intact  Cognition: WNL  Sleep:  Poor   Screenings: AIMS    Flowsheet Row Video Visit from 06/13/2021 in [facility] Video Visit from 05/16/2021 in [facility]  AIMS Total Score 0 0      GAD-7    Flowsheet Row Office Visit from 03/14/2023 in [facility] Office Visit from 10/31/2022 in [facility] Office Visit from 10/15/2022 in [facility] Office Visit from 09/13/2022 in [facility] Office Visit from 05/01/2022 in [facility]  Total GAD-7 Score 3 4 7 12  0      PHQ2-9    Flowsheet Row Office Visit from 03/14/2023 in [facility] Office Visit from 10/31/2022 in [facility] Office Visit from 10/15/2022 in [facility] Office Visit from 09/13/2022 in [facility] Clinical Support from 07/20/2022 in [facility]  PHQ-2 Total Score 1 2 2 2  0  PHQ-9 Total Score 3 3 3 4  0      Flowsheet Row Video Visit from 05/27/2023 in [facility] Admission (Discharged) from 04/08/2023 in [facility] Video Visit from 04/05/2023 in [facility]  C-SSRS RISK CATEGORY No Risk No Risk No Risk        Assessment and Plan: [name] is a 68 year old Caucasian female who has a history of PTSD, bipolar disorder, panic disorder, insomnia was evaluated by telemedicine today.  Discussed assessment and plan as noted below.  Insomnia-unstable Patient currently struggles with lack of sleep hygiene and hence sleep has been disrupted with grogginess during the day as well as mood changes including irritability.  Trazodone  200 mg does help her when she takes it however she often delays taking it. - Discussed the importance of having a set bedtime routine and sleep schedule.  Patient to work on that. - Continue Trazodone  200 mg at bedtime.  Panic disorder-improving Denies any significant panic attacks.  She had her surgery completed in March which did not go well. - Continue Venlafaxine  150 mg daily. - Keep psychotherapy visit with Ms. Deetta Farrow which is coming up. - Continue Hydroxyzine  50 mg twice a day as needed.  PTSD-unstable Recent mood symptoms and sleep problems likely contributed by recent traumatic events as well as lack of sleep hygiene. - Continue Venlafaxine  150 mg daily - Has upcoming appointment for psychotherapy with Ms. Deetta Farrow.  Bipolar disorder type I mixed moderate-unstable Current mood symptoms likely due to lack of sleep hygiene, sleep  problems. - Increase Oxcarbazepine  to 750 mg daily in divided dosage - Continue Gabapentin  600 mg 4 times a day. - Continue Trazodone  as prescribed and advised to work on sleep hygiene.  Reviewed and discussed labs dated 04/24/2023-TSH-1.61-within normal limits, CBC with differential-within normal limits, CMP-within normal limits.  Follow-up  Follow-up in clinic in 3 to 4 weeks or sooner if needed.    Collaboration of Care: Collaboration of Care: Referral or follow-up with counselor/therapist AEB encouraged to keep appointment with therapist.  Patient/Guardian was advised Release of Information must be obtained prior to any record release in order to collaborate their care with an outside provider. Patient/Guardian was advised if they have not already done so to contact the registration department to sign all necessary forms in order for us  to release information regarding their care.   Consent: Patient/Guardian gives verbal consent for treatment and assignment of benefits for services provided during this visit. Patient/Guardian expressed understanding and agreed to proceed.   This note was generated in part or whole with voice recognition software. Voice recognition is usually quite accurate but there are transcription errors that can and very often do occur. I apologize for any typographical errors that were not detected and corrected.    Johnathon Olden, MD 05/28/2023, 9:48 AM

## 2023-05-29 ENCOUNTER — Telehealth: Payer: Self-pay

## 2023-05-29 DIAGNOSIS — I25118 Atherosclerotic heart disease of native coronary artery with other forms of angina pectoris: Secondary | ICD-10-CM

## 2023-05-29 DIAGNOSIS — M961 Postlaminectomy syndrome, not elsewhere classified: Secondary | ICD-10-CM | POA: Diagnosis not present

## 2023-05-29 DIAGNOSIS — M4807 Spinal stenosis, lumbosacral region: Secondary | ICD-10-CM | POA: Diagnosis not present

## 2023-05-29 DIAGNOSIS — Z9884 Bariatric surgery status: Secondary | ICD-10-CM | POA: Diagnosis not present

## 2023-05-29 DIAGNOSIS — M818 Other osteoporosis without current pathological fracture: Secondary | ICD-10-CM | POA: Diagnosis not present

## 2023-05-29 DIAGNOSIS — M533 Sacrococcygeal disorders, not elsewhere classified: Secondary | ICD-10-CM | POA: Diagnosis not present

## 2023-05-29 DIAGNOSIS — D508 Other iron deficiency anemias: Secondary | ICD-10-CM | POA: Diagnosis not present

## 2023-05-29 NOTE — Telephone Encounter (Signed)
 ----- Message from [facility] sent at 05/29/2023  2:22 PM EDT ----- Regarding: FW: PEER TO PEER REQUEST Ashley Fields, Will you check and see if a Lexiscan  was ordered and if not order one and let the patient know. The peer to peer was to be done when I was out with my mom surgery. So we need to pivot and get her what she needs done. Thanks, Sheri ----- Message ----- From: Ashley Blitz, RN Sent: 05/22/2023   9:04 AM EDT To: Ashley Cockayne, NP; Ashley Fields Subject: RE: PEER TO PEER REQUEST                       Hello,  Could we reorder the PET (since the case was closed) and do a peer to peer for this?   Sheri, thoughts on a Lexi?  Ashley Fields ----- Message ----- From: Ashley Cockayne, NP Sent: 05/21/2023   4:30 PM EDT To: Ashley Blitz, RN Subject: FW: PEER TO PEER REQUEST                        ----- Message ----- From: Ashley Fields Sent: 05/21/2023   1:50 PM EDT To: Ashley Cockayne, NP Subject: RE: PEER TO PEER REQUEST                       Hello,   A peer to peer discussion was emailed. If it was never done. The Ashley Fields is still denied. ----- Message ----- From: Ashley Cockayne, NP Sent: 05/21/2023   1:19 PM EDT To: Ashley Fields Subject: FW: PEER TO PEER REQUEST                       Ashley Fields,  Where are we with this one? Thanks, Sheri ----- Message ----- From: Ashley Cockayne, NP Sent: 05/16/2023   8:30 AM EDT To: Ashley Fields Subject: RE: PEER TO PEER REQUEST                       Good morning. I am not in the office this week. In her note it states that she has back pain from a fracture that was being treated medically. That would make her unable to walk on the treadmill. I will be more than happy to speak with them next week when I return. What do we need to do to make that happen? Thanks,  Ashley Fields ----- Message ----- From: Ashley Fields Sent: 05/15/2023  10:43 AM EDT To: Ashley Cockayne, NP; Ashley Due, RN; # Subject:  PEER TO PEER REQUEST                           Good morning,   Ashley Fields is asking for more clinicals or a peer to peer discussion to approve auth. Are there any specific clinicals to send with this information?  Following are the details.   Urgent Action Needed: Information needed in order to approve request for member Ashley Fields. Respond Before 05/19/2023. This notice is not a denial of coverage notice. This notice is an opportunity to provide additional information, not previously provided, or to engage in a Peer-to-Peer discussion, prior to issuing a denial decision.  This fax is to inform you that our Medical Director has reviewed your request for: Ashley Fields, procedure code:  Requested [phone] Myocardial (heart) imaging, positron emission  tomography with computed tomography transmission (PET/CT) perfusion study, is a type of picture study that looks at blood supply to the heart under stress conditions (similar to exercise) or at rest.  If we do not receive additional information, or a request for a Peer-to-Peer discussion, to support an approval by the date specified, the information below will be used in making a determination. This information contains the reason why the request does not currently meet medical necessity, and the information needed to support an approval: Your doctor told us  that there is a concern related to your heart. An imaging study was asked for. We cannot approve this request because: This test is supported for any of these reasons.  To recheck your known CAD (coronary artery disease) because you are having chest pain. This chest pain is not normal for your problem. CAD is a disease of the blood vessels that provide blood to your heart muscle.  Results of your stress ECG (electrocardiogram) test were not normal, or your doctor thinks that the results that showed a problem are false. A stress ECG is a tracing of your heart's actions that is done while you  walk or jog on a treadmill.  To see if your CAD could be causing symptoms that could be related to your heart. These symptoms could include passing out for an unknown reason or a rapid heart rate that starts in the lower part of your heart.  To check your heart muscle's function after the blood flow has been restored or your doctor is using drugs to treat you.  One of the other reasons listed in this guideline. The details sent to us  do not show any of these reasons. We told your doctor about this. Please talk to your doctor if you have questions.  Please provide the requested clinical information Before 05/19/2023. Please call [phone], select the prompt associated with the request type and enter in reference number [id number] to speak with a clinician about this request. You may fax additional clinical information to support this request to [phone] for review. You may also schedule a Peer-to-Peer discussion regarding the case. You may schedule a discussion with a Wellsite geologist by calling [phone], select the prompt associated with the request type and enter in reference number [id number]. You also have the opportunity to schedule a Peer to Peer discussion online at [email or web address]. Search for the case by using the Authorization Lookup tab, and then go to P2P Availability, to schedule the discussion.  Thanks, [employer]

## 2023-05-29 NOTE — Telephone Encounter (Signed)
 Called patient to advise of change in procedure (PET to Lexiscan ) due to insurance issues  Left message on voicemail to return call to the office  Order is entered for Lexiscan  - patient will need to schedule

## 2023-06-13 ENCOUNTER — Other Ambulatory Visit: Payer: Self-pay | Admitting: Family Medicine

## 2023-06-13 DIAGNOSIS — M5412 Radiculopathy, cervical region: Secondary | ICD-10-CM | POA: Diagnosis not present

## 2023-06-13 DIAGNOSIS — M47896 Other spondylosis, lumbar region: Secondary | ICD-10-CM | POA: Diagnosis not present

## 2023-06-13 DIAGNOSIS — M461 Sacroiliitis, not elsewhere classified: Secondary | ICD-10-CM | POA: Diagnosis not present

## 2023-06-13 DIAGNOSIS — Z79899 Other long term (current) drug therapy: Secondary | ICD-10-CM | POA: Diagnosis not present

## 2023-06-13 DIAGNOSIS — Z79891 Long term (current) use of opiate analgesic: Secondary | ICD-10-CM | POA: Diagnosis not present

## 2023-06-13 DIAGNOSIS — H00021 Hordeolum internum right upper eyelid: Secondary | ICD-10-CM

## 2023-06-13 DIAGNOSIS — M48061 Spinal stenosis, lumbar region without neurogenic claudication: Secondary | ICD-10-CM | POA: Diagnosis not present

## 2023-06-13 DIAGNOSIS — M791 Myalgia, unspecified site: Secondary | ICD-10-CM | POA: Diagnosis not present

## 2023-06-13 DIAGNOSIS — G894 Chronic pain syndrome: Secondary | ICD-10-CM | POA: Diagnosis not present

## 2023-06-13 DIAGNOSIS — Z981 Arthrodesis status: Secondary | ICD-10-CM | POA: Diagnosis not present

## 2023-06-13 DIAGNOSIS — M533 Sacrococcygeal disorders, not elsewhere classified: Secondary | ICD-10-CM | POA: Diagnosis not present

## 2023-06-13 DIAGNOSIS — M542 Cervicalgia: Secondary | ICD-10-CM | POA: Diagnosis not present

## 2023-06-14 NOTE — Telephone Encounter (Signed)
 Requested medications are due for refill today.  unsure  Requested medications are on the active medications list.  no  Last refill. 04/16/2022  Future visit scheduled.   yes  Notes to clinic.  Medication was discontinued. Medication not assigned to a protocol. Please review for refill.    Requested Prescriptions  Pending Prescriptions Disp Refills   trimethoprim -polymyxin b  (POLYTRIM ) ophthalmic solution [Pharmacy Med Name: POLYMYXIN B -TMP EYE DROPS] 10 mL 0    Sig: PLACE 1 DROP INTO THE RIGHT EYE EVERY 6 HOURS.     Off-Protocol Failed - 06/14/2023 12:44 PM      Failed - Medication not assigned to a protocol, review manually.      Passed - Valid encounter within last 12 months    Recent Outpatient Visits           1 month ago Annual physical exam   [place] [place], Kayleen Party, DO   3 months ago Hypotension, unspecified hypotension type   [facility] [place], Kayleen Party, DO   4 months ago Acute non-recurrent frontal sinusitis   [place] [facility] [place], Kayleen Party, DO       Future Appointments             In 1 week Ronald Cockayne, NP [facility] [place]   In 4 months [place], Kayleen Party, DO [facility]

## 2023-06-18 ENCOUNTER — Other Ambulatory Visit

## 2023-06-18 ENCOUNTER — Encounter: Payer: Self-pay | Admitting: Internal Medicine

## 2023-06-19 ENCOUNTER — Ambulatory Visit (INDEPENDENT_AMBULATORY_CARE_PROVIDER_SITE_OTHER): Admitting: Professional Counselor

## 2023-06-19 ENCOUNTER — Other Ambulatory Visit: Payer: Self-pay | Admitting: Family Medicine

## 2023-06-19 DIAGNOSIS — F3162 Bipolar disorder, current episode mixed, moderate: Secondary | ICD-10-CM | POA: Diagnosis not present

## 2023-06-19 DIAGNOSIS — F410 Panic disorder [episodic paroxysmal anxiety] without agoraphobia: Secondary | ICD-10-CM

## 2023-06-19 DIAGNOSIS — Z1231 Encounter for screening mammogram for malignant neoplasm of breast: Secondary | ICD-10-CM

## 2023-06-20 ENCOUNTER — Other Ambulatory Visit: Payer: Self-pay | Admitting: Family Medicine

## 2023-06-20 DIAGNOSIS — J329 Chronic sinusitis, unspecified: Secondary | ICD-10-CM

## 2023-06-20 DIAGNOSIS — H6993 Unspecified Eustachian tube disorder, bilateral: Secondary | ICD-10-CM
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
## 2023-06-21 ENCOUNTER — Ambulatory Visit
Admission: RE | Admit: 2023-06-21 | Discharge: 2023-06-21 | Disposition: A | Discharge status: Home / Self Care | Source: Ambulatory Visit | Attending: Cardiology | Admitting: Cardiology

## 2023-06-21 ENCOUNTER — Encounter: Payer: Self-pay | Admitting: Internal Medicine

## 2023-06-21 DIAGNOSIS — I25118 Atherosclerotic heart disease of native coronary artery with other forms of angina pectoris: Secondary | ICD-10-CM | POA: Diagnosis not present

## 2023-06-21 LAB — NM MYOCAR MULTI W/SPECT W/WALL MOTION / EF
Estimated workload: 1
Exercise duration (min): 0 min
Exercise duration (sec): 0 s
LV dias vol: 75 mL (ref 46–106)
LV sys vol: 19 mL (ref 3.8–5.2)
MPHR: 153 {beats}/min
Nuc Stress EF: 75 %
Peak HR: 92 {beats}/min
Percent HR: 60 %
Rest HR: 71 {beats}/min
Rest Nuclear Isotope Dose: 10.6 mCi
SDS: 3
SRS: 5
SSS: 0
ST Depression (mm): 0 mm
Stress Nuclear Isotope Dose: 31.8 mCi
TID: 1.06

## 2023-06-21 MED ORDER — TECHNETIUM TC 99M TETROFOSMIN IV KIT
10.5800 | PACK | Freq: Once | INTRAVENOUS | Status: AC | PRN
  Administered 2023-06-21: 10.58 via INTRAVENOUS

## 2023-06-21 MED ORDER — TECHNETIUM TC 99M TETROFOSMIN IV KIT
31.8000 | PACK | Freq: Once | INTRAVENOUS | Status: AC | PRN
  Administered 2023-06-21: 31.8 via INTRAVENOUS

## 2023-06-21 MED ORDER — REGADENOSON 0.4 MG/5ML IV SOLN
0.4000 mg | Freq: Once | INTRAVENOUS | Status: AC
  Administered 2023-06-21: 0.4 mg via INTRAVENOUS

## 2023-06-21 NOTE — Telephone Encounter (Signed)
 Requested Prescriptions  Pending Prescriptions Disp Refills   Azelastine  HCl 137 MCG/SPRAY SOLN [Pharmacy Med Name: AZELASTINE  0.1% (137 MCG) SPRY] 48 mL 1    Sig: PLACE 2 SPRAYS INTO BOTH NOSTRILS 2 (TWO) TIMES DAILY. USE IN EACH NOSTRIL AS DIRECTED     Ear, Nose, and Throat: Nasal Preparations - Antiallergy Passed - 06/21/2023  5:16 PM      Passed - Valid encounter within last 12 months    Recent Outpatient Visits           1 month ago Annual physical exam   [facility] [place], Kayleen Party, DO   3 months ago Hypotension, unspecified hypotension type   [facility] [place], Kayleen Party, DO   4 months ago Acute non-recurrent frontal sinusitis   [facility] [place], Kayleen Party, DO       Future Appointments             In 4 days Ronald Cockayne, NP [facility] [place]   In 4 months [place], Kayleen Party, DO  [facility], [facility]

## 2023-06-24 DIAGNOSIS — H43393 Other vitreous opacities, bilateral: Secondary | ICD-10-CM | POA: Diagnosis not present

## 2023-06-24 DIAGNOSIS — H04123 Dry eye syndrome of bilateral lacrimal glands: Secondary | ICD-10-CM | POA: Diagnosis not present

## 2023-06-24 DIAGNOSIS — H35371 Puckering of macula, right eye: Secondary | ICD-10-CM | POA: Diagnosis not present

## 2023-06-24 DIAGNOSIS — H16223 Keratoconjunctivitis sicca, not specified as Sjogren's, bilateral: Secondary | ICD-10-CM | POA: Diagnosis not present

## 2023-06-24 DIAGNOSIS — H43813 Vitreous degeneration, bilateral: Secondary | ICD-10-CM | POA: Diagnosis not present

## 2023-06-25 ENCOUNTER — Ambulatory Visit: Attending: Cardiology | Admitting: Cardiology

## 2023-06-25 ENCOUNTER — Ambulatory Visit: Payer: Self-pay | Admitting: Cardiology

## 2023-06-25 ENCOUNTER — Encounter: Payer: Self-pay | Admitting: Cardiology

## 2023-06-25 VITALS — BP 92/56 | HR 70 | Ht 62.0 in | Wt 151.6 lb

## 2023-06-25 DIAGNOSIS — R9439 Abnormal result of other cardiovascular function study: Secondary | ICD-10-CM

## 2023-06-25 DIAGNOSIS — I959 Hypotension, unspecified: Secondary | ICD-10-CM

## 2023-06-25 DIAGNOSIS — E782 Mixed hyperlipidemia: Secondary | ICD-10-CM | POA: Diagnosis not present

## 2023-06-25 DIAGNOSIS — Z79899 Other long term (current) drug therapy: Secondary | ICD-10-CM

## 2023-06-25 DIAGNOSIS — I2510 Atherosclerotic heart disease of native coronary artery without angina pectoris: Secondary | ICD-10-CM | POA: Diagnosis not present

## 2023-06-25 DIAGNOSIS — H34211 Partial retinal artery occlusion, right eye: Secondary | ICD-10-CM | POA: Diagnosis not present

## 2023-06-25 DIAGNOSIS — R011 Cardiac murmur, unspecified: Secondary | ICD-10-CM

## 2023-06-25 NOTE — Progress Notes (Signed)
Discussed results during appointment

## 2023-06-25 NOTE — Patient Instructions (Signed)
 Medication Instructions:  Your physician recommends that you continue on your current medications as directed. Please refer to the Current Medication list given to you today.   *If you need a refill on your cardiac medications before your next appointment, please call your pharmacy*  Lab Work: Your provider would like for you to have following labs drawn today CBC, BMP.   If you have labs (blood work) drawn today and your tests are completely normal, you will receive your results only by: MyChart Message (if you have MyChart) OR A paper copy in the mail If you have any lab test that is abnormal or we need to change your treatment, we will call you to review the results.  Testing/Procedures:  Ashley Fields A DEPT OF [place].  [facility] [place] [address] Ashley Fields [address] [place] [address] Dept: [phone] Loc: [phone]  Ashley Fields  06/25/2023  You are scheduled for a Cardiac Catheterization on Tuesday, June 24 with Dr. Veryl Gottron End.  1. Please arrive at the [facility] Entrance of [facility], [address], [place] [address] at 8:30 AM (This is 1 hour(s) prior to your procedure time).  Proceed to the Check-In Desk directly inside the entrance.  Procedure Parking: Use the entrance off of the [address] side of the hospital. Turn right upon entering and follow the driveway to parking that is directly in front of the Heart & Vascular Center. There is no valet parking available at this entrance, however there is an awning directly in front of the Heart & Vascular Center for drop off/ pick up for patients.  Special note: Every effort is made to have your procedure done on time. Please understand that emergencies sometimes delay scheduled procedures.  2. Diet: Do not eat solid foods after midnight.  The patient may have clear liquids until 5am upon the day of the procedure.  3. Labs: You will need to have  blood drawn on Tuesday, June 17 at [facility] Entrance, Go to 1st desk on your right to register.  Address: [address] [place], [place] [address]  Open: 8am - 5pm  Phone: [phone]. You do not need to be fasting.  4. Medication instructions in preparation for your procedure:   Contrast Allergy: No  Stop taking, Lasix  (Furosemide )  Sunday, June 22,  On the morning of your procedure, take your Aspirin  81 mg and any morning medicines NOT listed above.  You may use sips of water .  5. Plan to go home the same day, you will only stay overnight if medically necessary. 6. Bring a current list of your medications and current insurance cards. 7. You MUST have a responsible person to drive you home. 8. Someone MUST be with you the first 24 hours after you arrive home or your discharge will be delayed. 9. Please wear clothes that are easy to get on and off and wear slip-on shoes.  Thank you for allowing us  to care for you!   -- [place] [facility]   Follow-Up: At [facility], you and your health needs are our priority.  As part of our continuing mission to provide you with exceptional heart care, our providers are all part of one team.  This team includes your primary Cardiologist (physician) and Advanced Practice Providers or APPs (Physician Assistants and Nurse Practitioners) who all work together to provide you with the care you need, when you need it.  Your  next appointment:   2 - 3 week(s)  Provider:   Ronald Cockayne, NP

## 2023-06-25 NOTE — Progress Notes (Signed)
 Cardiology Office Note   Date:  06/25/2023  ID:  Mauriah Mcmillen, DOB November 18, 1955, MRN [id number] PCP: [name], DO  [facility] Providers Cardiologist:  None     History of Present Illness Ashley Fields is a 68 y.o. female with a past medical history of coronary calcification on CT, aortic atherosclerosis, bipolar disorder, prior tobacco abuse, hyperlipidemia, gastric bypass, Hollenhorst plaque (right eye), mild carotid artery disease, arthritis, depression, GERD, hepatitis C, presents today for follow-up.  Prior history of coronary calcification on CT scan with coronary calcium  score 2456, 99 percentile.  Since medical control, negative stress testing in 2019 and August 2022.  Echocardiogram completed 02/2022 with an LVEF 60 to 60%, no RWMA, mild LVH, and no evidence of valvular heart disease.  ZIO monitor completed 03/09/2022 revealed occasional paroxysmal SVT, no significant sustained arrhythmia.  She was last seen in clinic 03/28/2023 states that she had been having labile blood pressures.  Blood pressure relatively low in the mornings she wakes up with very high in the afternoon.  She denies chest pain, shortness of breath but does have occasional peripheral edema.  She notes that the bouts of low blood pressure she had dizziness and lightheadedness but denies any syncope or near syncopal episodes.  She also had an upcoming surgical procedure.  She was scheduled for a cardiac PET stress for surgical clearance which was denied by her insurance and she was changed to a Lexiscan  Myoview .   She returns to clinic today stating overall she has been doing well.  Unfortunately she continues to suffer from labile blood pressures.  She states that she does not have surgery that went well without postprocedure complications.  She states that she had already [facility] stress testing report on her chart and with her background is working in the [employer] for 10 years prior to moving to [place] from [place]  she is aware of the need for left heart catheterization.  She states that her brothers have all had multiple stents placed.  States that she has been compliant with her current medication regimen with any adverse side effects.  Denies any hospitalizations or visits to the emergency department.  ROS: 10 point review of system has been reviewed and considered negative with exception was been listed in the HPI  Studies Reviewed EKG Interpretation Date/Time:  Tuesday June 25 2023 09:02:26 EDT Ventricular Rate:  70 PR Interval:  194 QRS Duration:  90 QT Interval:  390 QTC Calculation: 421 R Axis:   27  Text Interpretation: Normal sinus rhythm Normal ECG When compared with ECG of 01-Apr-2023 10:20, No significant change was found Confirmed by Ronald Cockayne ([id number]) on 06/25/2023 9:06:46 AM    Lexiscan  MPI 06/21/2023   Abnormal, probably low risk pharmacologic myocardial perfusion stress test.   There is a moderate in size, mild in severity, partially reversible defect.  The apical septal and apical defects are fixed and most likely represent artifact.  The apical anterior defect is reversible and suspicious for ischemia.   Left ventricular systolic function is normal (LVEF > 65%).   Coronary artery calcification is noted on the attenuation correction CT.   Sensitivity and specificity of the study are degraded by attenuation from breast implants and extracardiac activity.   Compared to the prior study from 08/08/2020, the apical anterior reversible defect appears new  Event Monitor (Zio) 03/09/2022 Conclusion Occasional paroxysmal SVT. No significant sustained arrhythmias. Paroxysmal SVT coincided with patient triggered events.   2D echo 02/28/2022 1. Left  ventricular ejection fraction, by estimation, is 60 to 65%. The  left ventricle has normal function. The left ventricle has no regional  wall motion abnormalities. There is mild left ventricular hypertrophy.  Left  ventricular diastolic function  could not be evaluated.   2. Right ventricular systolic function is normal. The right ventricular  size is normal.   3. The mitral valve is normal in structure. No evidence of mitral valve  regurgitation.   4. The aortic valve was not well visualized. Aortic valve regurgitation  is not visualized.   5. The inferior vena cava is normal in size with greater than 50%  respiratory variability, suggesting right atrial pressure of 3 mmHg.    Myoview  Lexicsan 08/2020 Narrative & Impression  There was no ST segment deviation noted during stress. No T wave inversion was noted during stress. Defect 1: There is a small defect of mild severity present in the apex location likely artifact. The study is normal. This is a low risk study. The left ventricular ejection fraction is normal (55-65%). CT attenuation images shows moderate aortic and coronary calcifications.     Myoview  lexiscan  2019 Narrative & Impression  Pharmacological myocardial perfusion imaging study with no significant  ischemia Normal wall motion, EF estimated at 83% No EKG changes concerning for ischemia at peak stress or in recovery. Low risk scan    Cardiac CT scoring 2019 IMPRESSION: Coronary calcium  score of 2456 . This was 66 th percentile for age and sex matched control.   Consider f/u heart catheterization or perfusion study given how extensive the coronary calcification is Risk Assessment/Calculations           Physical Exam VS:  BP (!) 92/56   Pulse 70   Ht 5' 2 (1.575 m)   Wt 151 lb 9.6 oz (68.8 kg)   SpO2 96%   BMI 27.73 kg/m    Wt Readings from Last 3 Encounters:  06/25/23 151 lb 9.6 oz (68.8 kg)  05/02/23 148 lb 12.8 oz (67.5 kg)  04/08/23 146 lb (66.2 kg)    GEN: Well nourished, well developed in no acute distress NECK: No JVD; No carotid bruits CARDIAC: RRR, II/VI systolic murmur RUSB, without rubs or gallops RESPIRATORY:  Clear to auscultation without rales,  wheezing or rhonchi  ABDOMEN: Soft, non-tender, non-distended EXTREMITIES: Trace pretibial edema; No deformity   ASSESSMENT AND PLAN Nonobstructive coronary artery disease with prior calcium  score 2456 which is 90 percentile for age and sex matched control.  Negative Myoview  Lexiscan  in 2019 and 2022.  EKG today reveals sinus rhythm with a rate of 70 with no acute changes noted.  Unfortunately recent Lexiscan  Myoview  stress testing revealed abnormal findings.  Abnormal, probable low risk pharmacological myocardial perfusion stress testing.  There is moderate in size, mild in severity, partially reversible defect.  The apical septal and apical defects are fixed and most likely represent artifact.  The apical anterior defect is reversible and suspicious for ischemia.  Coronary calcification is noted on the attenuation correction CT.  Since significance persisted study were degraded by attenuation from breast implants and extracardiac activity.  Compared to prior study from 08/08/2020 the apical anterior reversible defect appears new.  Previously patient been scheduled for cardiac PET stress testing to get better pictures but unfortunately insurance declined her bed and she was scheduled for Myoview .  With her defect that appears to be reversible and is suspicious for ischemia she is being scheduled for a left heart catheterization with Dr. Nolan Battle on 06/28/2022.  She has been sent for a CBC and a BMP today.  She has been continued on aspirin  81 mg daily, ezetimibe  10 mg daily and rosuvastatin  40 mg daily.  Mixed hyperlipidemia with last LDL 46 which remains at goal of less than 70.  She is continued on ezetimibe  10 mg daily rosuvastatin  40 mg daily.  Hypotension with labile blood pressures with a blood pressure today 92/56.  Blood pressure remains soft.  She is continued on furosemide  20 mg as needed for swelling or edema.  She continues off of all antihypertensive medications and has been encouraged to continue to  monitor pressures at home 2-3 times per week.  Heart murmur noted on exam with last echocardiogram 02/2022 revealed LVEF 60 to 65%, no RWMA, mild left ventricle hypertrophy, right ventricular function and size were normal without valvular abnormalities.  Will continue to monitor with surveillance studies.  Hollenhorst plaque to the right eye where carotids with minimal bilateral disease.  Cholesterol remains at goal.  She is continued on statin.      Informed Consent   Shared Decision Making/Informed Consent The risks [stroke (1 in 1000), death (1 in 1000), kidney failure [usually temporary] (1 in 500), bleeding (1 in 200), allergic reaction [possibly serious] (1 in 200)], benefits (diagnostic support and management of coronary artery disease) and alternatives of a cardiac catheterization were discussed in detail with Ms. Leflore and she is willing to proceed.     Dispo: Patient return to clinic to see MD/APP in 2 to 3 weeks or sooner following procedure.  Signed, Ileane Sando, NP

## 2023-06-25 NOTE — H&P (View-Only) (Signed)
 Cardiology Office Note   Date:  06/25/2023  ID:  Mauriah Mcmillen, DOB November 18, 1955, MRN [id number] PCP: [name], DO  [facility] Providers Cardiologist:  None     History of Present Illness Ashley Fields is a 68 y.o. female with a past medical history of coronary calcification on CT, aortic atherosclerosis, bipolar disorder, prior tobacco abuse, hyperlipidemia, gastric bypass, Hollenhorst plaque (right eye), mild carotid artery disease, arthritis, depression, GERD, hepatitis C, presents today for follow-up.  Prior history of coronary calcification on CT scan with coronary calcium  score 2456, 99 percentile.  Since medical control, negative stress testing in 2019 and August 2022.  Echocardiogram completed 02/2022 with an LVEF 60 to 60%, no RWMA, mild LVH, and no evidence of valvular heart disease.  ZIO monitor completed 03/09/2022 revealed occasional paroxysmal SVT, no significant sustained arrhythmia.  She was last seen in clinic 03/28/2023 states that she had been having labile blood pressures.  Blood pressure relatively low in the mornings she wakes up with very high in the afternoon.  She denies chest pain, shortness of breath but does have occasional peripheral edema.  She notes that the bouts of low blood pressure she had dizziness and lightheadedness but denies any syncope or near syncopal episodes.  She also had an upcoming surgical procedure.  She was scheduled for a cardiac PET stress for surgical clearance which was denied by her insurance and she was changed to a Lexiscan  Myoview .   She returns to clinic today stating overall she has been doing well.  Unfortunately she continues to suffer from labile blood pressures.  She states that she does not have surgery that went well without postprocedure complications.  She states that she had already [facility] stress testing report on her chart and with her background is working in the [employer] for 10 years prior to moving to [place] from [place]  she is aware of the need for left heart catheterization.  She states that her brothers have all had multiple stents placed.  States that she has been compliant with her current medication regimen with any adverse side effects.  Denies any hospitalizations or visits to the emergency department.  ROS: 10 point review of system has been reviewed and considered negative with exception was been listed in the HPI  Studies Reviewed EKG Interpretation Date/Time:  Tuesday June 25 2023 09:02:26 EDT Ventricular Rate:  70 PR Interval:  194 QRS Duration:  90 QT Interval:  390 QTC Calculation: 421 R Axis:   27  Text Interpretation: Normal sinus rhythm Normal ECG When compared with ECG of 01-Apr-2023 10:20, No significant change was found Confirmed by Ronald Cockayne ([id number]) on 06/25/2023 9:06:46 AM    Lexiscan  MPI 06/21/2023   Abnormal, probably low risk pharmacologic myocardial perfusion stress test.   There is a moderate in size, mild in severity, partially reversible defect.  The apical septal and apical defects are fixed and most likely represent artifact.  The apical anterior defect is reversible and suspicious for ischemia.   Left ventricular systolic function is normal (LVEF > 65%).   Coronary artery calcification is noted on the attenuation correction CT.   Sensitivity and specificity of the study are degraded by attenuation from breast implants and extracardiac activity.   Compared to the prior study from 08/08/2020, the apical anterior reversible defect appears new  Event Monitor (Zio) 03/09/2022 Conclusion Occasional paroxysmal SVT. No significant sustained arrhythmias. Paroxysmal SVT coincided with patient triggered events.   2D echo 02/28/2022 1. Left  ventricular ejection fraction, by estimation, is 60 to 65%. The  left ventricle has normal function. The left ventricle has no regional  wall motion abnormalities. There is mild left ventricular hypertrophy.  Left  ventricular diastolic function  could not be evaluated.   2. Right ventricular systolic function is normal. The right ventricular  size is normal.   3. The mitral valve is normal in structure. No evidence of mitral valve  regurgitation.   4. The aortic valve was not well visualized. Aortic valve regurgitation  is not visualized.   5. The inferior vena cava is normal in size with greater than 50%  respiratory variability, suggesting right atrial pressure of 3 mmHg.    Myoview  Lexicsan 08/2020 Narrative & Impression  There was no ST segment deviation noted during stress. No T wave inversion was noted during stress. Defect 1: There is a small defect of mild severity present in the apex location likely artifact. The study is normal. This is a low risk study. The left ventricular ejection fraction is normal (55-65%). CT attenuation images shows moderate aortic and coronary calcifications.     Myoview  lexiscan  2019 Narrative & Impression  Pharmacological myocardial perfusion imaging study with no significant  ischemia Normal wall motion, EF estimated at 83% No EKG changes concerning for ischemia at peak stress or in recovery. Low risk scan    Cardiac CT scoring 2019 IMPRESSION: Coronary calcium  score of 2456 . This was 66 th percentile for age and sex matched control.   Consider f/u heart catheterization or perfusion study given how extensive the coronary calcification is Risk Assessment/Calculations           Physical Exam VS:  BP (!) 92/56   Pulse 70   Ht 5' 2 (1.575 m)   Wt 151 lb 9.6 oz (68.8 kg)   SpO2 96%   BMI 27.73 kg/m    Wt Readings from Last 3 Encounters:  06/25/23 151 lb 9.6 oz (68.8 kg)  05/02/23 148 lb 12.8 oz (67.5 kg)  04/08/23 146 lb (66.2 kg)    GEN: Well nourished, well developed in no acute distress NECK: No JVD; No carotid bruits CARDIAC: RRR, II/VI systolic murmur RUSB, without rubs or gallops RESPIRATORY:  Clear to auscultation without rales,  wheezing or rhonchi  ABDOMEN: Soft, non-tender, non-distended EXTREMITIES: Trace pretibial edema; No deformity   ASSESSMENT AND PLAN Nonobstructive coronary artery disease with prior calcium  score 2456 which is 90 percentile for age and sex matched control.  Negative Myoview  Lexiscan  in 2019 and 2022.  EKG today reveals sinus rhythm with a rate of 70 with no acute changes noted.  Unfortunately recent Lexiscan  Myoview  stress testing revealed abnormal findings.  Abnormal, probable low risk pharmacological myocardial perfusion stress testing.  There is moderate in size, mild in severity, partially reversible defect.  The apical septal and apical defects are fixed and most likely represent artifact.  The apical anterior defect is reversible and suspicious for ischemia.  Coronary calcification is noted on the attenuation correction CT.  Since significance persisted study were degraded by attenuation from breast implants and extracardiac activity.  Compared to prior study from 08/08/2020 the apical anterior reversible defect appears new.  Previously patient been scheduled for cardiac PET stress testing to get better pictures but unfortunately insurance declined her bed and she was scheduled for Myoview .  With her defect that appears to be reversible and is suspicious for ischemia she is being scheduled for a left heart catheterization with Dr. Nolan Battle on 06/28/2022.  She has been sent for a CBC and a BMP today.  She has been continued on aspirin  81 mg daily, ezetimibe  10 mg daily and rosuvastatin  40 mg daily.  Mixed hyperlipidemia with last LDL 46 which remains at goal of less than 70.  She is continued on ezetimibe  10 mg daily rosuvastatin  40 mg daily.  Hypotension with labile blood pressures with a blood pressure today 92/56.  Blood pressure remains soft.  She is continued on furosemide  20 mg as needed for swelling or edema.  She continues off of all antihypertensive medications and has been encouraged to continue to  monitor pressures at home 2-3 times per week.  Heart murmur noted on exam with last echocardiogram 02/2022 revealed LVEF 60 to 65%, no RWMA, mild left ventricle hypertrophy, right ventricular function and size were normal without valvular abnormalities.  Will continue to monitor with surveillance studies.  Hollenhorst plaque to the right eye where carotids with minimal bilateral disease.  Cholesterol remains at goal.  She is continued on statin.      Informed Consent   Shared Decision Making/Informed Consent The risks [stroke (1 in 1000), death (1 in 1000), kidney failure [usually temporary] (1 in 500), bleeding (1 in 200), allergic reaction [possibly serious] (1 in 200)], benefits (diagnostic support and management of coronary artery disease) and alternatives of a cardiac catheterization were discussed in detail with Ms. Leflore and she is willing to proceed.     Dispo: Patient return to clinic to see MD/APP in 2 to 3 weeks or sooner following procedure.  Signed, Ileane Sando, NP

## 2023-06-26 ENCOUNTER — Ambulatory Visit: Payer: Self-pay | Admitting: Cardiology

## 2023-06-26 LAB — BASIC METABOLIC PANEL WITH GFR
BUN/Creatinine Ratio: 17 (ref 12–28)
BUN: 13 mg/dL (ref 8–27)
CO2: 23 mmol/L (ref 20–29)
Calcium: 8.6 mg/dL — ABNORMAL LOW (ref 8.7–10.3)
Chloride: 97 mmol/L (ref 96–106)
Creatinine, Ser: 0.78 mg/dL (ref 0.57–1.00)
Glucose: 79 mg/dL (ref 70–99)
Potassium: 4.6 mmol/L (ref 3.5–5.2)
Sodium: 135 mmol/L (ref 134–144)
eGFR: 83 mL/min/{1.73_m2} (ref >59)

## 2023-06-26 LAB — CBC
Hematocrit: 39.5 % (ref 34.0–46.6)
Hemoglobin: 13 g/dL (ref 11.1–15.9)
MCH: 31.6 pg (ref 26.6–33.0)
MCHC: 32.9 g/dL (ref 31.5–35.7)
MCV: 96 fL (ref 79–97)
Platelets: 141 10*3/uL — ABNORMAL LOW (ref 150–450)
RBC: 4.12 x10E6/uL (ref 3.77–5.28)
RDW: 11.9 % (ref 11.7–15.4)
WBC: 4.7 10*3/uL (ref 3.4–10.8)

## 2023-06-26 NOTE — Progress Notes (Signed)
 Pre-procedure labs prior to cath. Labs have remained stable. Continue current medication regimen without any changes at this time.

## 2023-06-27 ENCOUNTER — Telehealth: Payer: Self-pay | Admitting: *Deleted

## 2023-06-27 NOTE — Telephone Encounter (Signed)
   Scheduled for: Left heart cath Date: 07/02/23 Time: 09:30 am Arrival Time: 08:30 am Location: [facility] Instructions: Pending call back  Patient did not answer so voicemail message was left with arrival time and request to call back so that we may review her instructions.

## 2023-06-28 ENCOUNTER — Encounter: Payer: Self-pay | Admitting: Psychiatry

## 2023-06-28 ENCOUNTER — Telehealth: Admitting: Psychiatry

## 2023-06-28 DIAGNOSIS — F410 Panic disorder [episodic paroxysmal anxiety] without agoraphobia: Secondary | ICD-10-CM | POA: Diagnosis not present

## 2023-06-28 DIAGNOSIS — F4310 Post-traumatic stress disorder, unspecified: Secondary | ICD-10-CM | POA: Diagnosis not present

## 2023-06-28 DIAGNOSIS — F3162 Bipolar disorder, current episode mixed, moderate: Secondary | ICD-10-CM | POA: Diagnosis not present

## 2023-06-28 DIAGNOSIS — G4701 Insomnia due to medical condition: Secondary | ICD-10-CM

## 2023-06-28 MED ORDER — VENLAFAXINE HCL ER 150 MG PO CP24: 150.0000 mg | ORAL_CAPSULE | Freq: Every day | ORAL | 0 refills | Status: DC

## 2023-06-28 MED ORDER — OXCARBAZEPINE 300 MG PO TABS: 300.0000 mg | ORAL_TABLET | Freq: Three times a day (TID) | ORAL | 1 refills | Status: DC

## 2023-06-28 NOTE — Progress Notes (Signed)
 Virtual Visit via Video Note  I connected with Ashley Fields on 06/28/23 at 10:20 AM EDT by a video enabled telemedicine application and verified that I am speaking with the correct person using two identifiers.  Location Provider Location : [facility] Patient Location : Home  Participants: Patient , Provider   I discussed the limitations of evaluation and management by telemedicine and the availability of in person appointments. The patient expressed understanding and agreed to proceed.   I discussed the assessment and treatment plan with the patient. The patient was provided an opportunity to ask questions and all were answered. The patient agreed with the plan and demonstrated an understanding of the instructions.   The patient was advised to call back or seek an in-person evaluation if the symptoms worsen or if the condition fails to improve as anticipated.   BH MD OP Progress Note  06/29/2023 8:22 AM Ashley Fields  MRN:  [id number]  Chief Complaint:  Chief Complaint  Patient presents with   Follow-up   Anxiety   Medication Refill   Insomnia   Bipolar symptoms    Discussed the use of AI scribe software for clinical note transcription with the patient, who gave verbal consent to proceed.  History of Present Illness Ashley Fields is a 68 year old Caucasian female, married, on disability, lives in [place], has a history of bipolar disorder, PTSD, panic disorder, insomnia, chronic pain, migraine headaches, history of laparoscopic gastric bypass was evaluated by telemedicine today.   She is experiencing significant stress and anxiety due to an upcoming cardiac catheterization scheduled for July 02, 2023, following an abnormal stress test. She feels 'on the edge all the time' and attributes her anxiety to the upcoming procedure.  She has difficulty sleeping, stating she has not slept since the previous morning and has been staying up until 2 or 3 AM over the past few days. She attributes  her sleep disturbances to anxiety about the procedure. She has been taking trazodone  200 mg and hydroxyzine  50 mg (two tablets) to aid sleep, but these have not been effective recently. She also uses strategies such as going outside and working in her garden to manage her anxiety.  Her current medications include oxycodone , gabapentin , trileptal  (oxcarbazepine ) 750 mg, and venlafaxine  150 mg.  Her relationship with her husband is currently good, and she is engaged in therapy sessions with a therapist she likes, although she has only met her once.   She experiences headaches, which she attributes to anxiety.  She is not interested in making any changes with her trazodone  at this time and would like to continue the same.  She will let this provider know if she is interested in changing her sleep medication.  Agreeable to dosage increase of her mood stabilizer, Trileptal .  She had most recent labs completed by her primary care and all her labs came back okay except for calcium  which was low.  Sodium level was within acceptable range.     Visit Diagnosis:    ICD-10-CM   1. Bipolar 1 disorder, mixed, moderate (HCC)  F31.62     2. PTSD (post-traumatic stress disorder)  F43.10 venlafaxine  XR (EFFEXOR  XR) 150 MG 24 hr capsule    3. Panic disorder  F41.0 Oxcarbazepine  (TRILEPTAL ) 300 MG tablet    venlafaxine  XR (EFFEXOR  XR) 150 MG 24 hr capsule    4. Insomnia due to medical condition  G47.01 Oxcarbazepine  (TRILEPTAL ) 300 MG tablet   pain, mood ,lack of sleep hygiene  Past Psychiatric History: I have reviewed past psychiatric history from progress note on 05/15/2018.  Past trials of Prozac, Wellbutrin, Lexapro, Cymbalta, Rexulti, Ambien -nightmares, Lunesta , Lamictal , risperidone   Past Medical History:  Past Medical History:  Diagnosis Date   Abdominal wall seroma (postoperative) 2025   Allergy    Anxiety    Aortic atherosclerosis (HCC)    Arthritis    Back pain    a.) s/p L4-L5 fusion  2008; b.) s/p PLIF L2-L3 and ALIF L5-S1 12/2021   Bilateral carotid artery disease (HCC)    Bipolar disorder (HCC)    Blood transfusion without reported diagnosis 1977   Transfusions from miscarriage & hemorrhaging   CAD (coronary artery disease)    Chronic kidney disease    DDD (degenerative disc disease), cervical    a.) s/p ACDF C4-C7   Depression    Disp fx of cuboid bone of right foot, init for clos fx 01/01/2017   Displacement of breast implant 2025   Fibromyalgia    GERD (gastroesophageal reflux disease)    Headache    Hepatitis C virus infection cured after antiviral drug therapy    a.) s/p Tx with ledipasvir/sofosbuvir   History of 2019 novel coronavirus disease (COVID-19) 01/23/2020   Hollenhorst plaque, right eye    Hyperlipidemia    Insomnia    a.) uses trazodone  PRN   Iron  deficiency anemia following bariatric surgery    Long-term use of aspirin  therapy    Lumbar radiculopathy    Neck pain    Neuromuscular disorder (HCC) 1997   Falling to much was an eye-opener   Osteoporosis    PSVT (paroxysmal supraventricular tachycardia) (HCC)    Systolic murmur    Thyroid  disease    Urine incontinence     Past Surgical History:  Procedure Laterality Date   5 miscarriages     1977-1985, Blood transfusion s/p miscarriage 1977   ABDOMINAL EXPOSURE N/A 12/18/2021   Procedure: ABDOMINAL EXPOSURE;  Surgeon: Gretta Lonni PARAS, MD;  [facility] OR;  Service: Vascular;  Laterality: N/A;   ACDF C4-C7  02/10/2015   ANTERIOR LUMBAR FUSION N/A 12/18/2021   Procedure: Anterior Lumbar Interbody Fusion  - Lumbar Five-Sacral One;  Surgeon: Joshua Alm RAMAN, MD;  Location: [facility] OR;  Service: Neurosurgery;  Laterality: N/A;   APPENDECTOMY  12/2008   AUGMENTATION MAMMAPLASTY Bilateral 2015   AUGMENTATION MAMMAPLASTY Bilateral 2018   implants redone w/ placement of implants under muscle   breast lift bilateral, implants  05/18/2015   bilateral, silicon naturel   BREAST REDUCTION SURGERY  Bilateral 1997   CESAREAN SECTION  07/27/1981   Placenta Previa   COLONOSCOPY WITH PROPOFOL  N/A 06/13/2020   Procedure: COLONOSCOPY WITH PROPOFOL ;  Surgeon: Janalyn Keene NOVAK, MD;  Location: [facility] ENDOSCOPY;  Service: Endoscopy;  Laterality: N/A;   COSMETIC SURGERY     Breast implants w/ lift, tummy tuck, upper & lower Blepharop   DILATION AND CURETTAGE OF UTERUS     ESOPHAGOGASTRODUODENOSCOPY (EGD) WITH PROPOFOL  N/A 06/13/2020   Procedure: ESOPHAGOGASTRODUODENOSCOPY (EGD) WITH PROPOFOL ;  Surgeon: Janalyn Keene NOVAK, MD;  Location: [facility] ENDOSCOPY;  Service: Endoscopy;  Laterality: N/A;   EXCISION OF ABDOMINAL WALL TUMOR N/A 04/08/2023   Procedure: EXCISION, NEOPLASM, ABDOMINAL WALL;  Surgeon: Rodolph Romano, MD;  Location: [facility] ORS;  Service: General;  Laterality: N/A;   EYE SURGERY  2017   Cataract surgery & lasik   GASTRIC BYPASS  12/24/2003   IVC FILTER INSERTION  12/2003   TrapEase Vena Cava Filter  LAPAROSCOPIC CHOLECYSTECTOMY N/A 03/2004   LUMBAR LAMINECTOMY  06/2006   L4-L5 (spinal fusion)   mini tummy tuck  05/07/2016   Bilateral bra/back roll lift skin removal   REDUCTION MAMMAPLASTY Bilateral 1997   SHOULDER ACROMIOPLASTY Left 03/27/2013   w/ labral debridement   SPINAL CORD STIMULATOR IMPLANT  11/2010   removed 05/2011   TOTAL ABDOMINAL HYSTERECTOMY N/A 1987    Family Psychiatric History: I have reviewed family psychiatric history from progress note on 05/15/2018.  Family History:  Family History  Problem Relation Age of Onset   COPD Mother    Lung cancer Mother 101   Diabetes Mother    Heart disease Mother    Stroke Mother    Heart attack Mother    Arthritis Mother    Hypertension Mother    Obesity Mother    Varicose Veins Mother    Heart disease Father    Heart attack Father 27   Early death Father    Depression Sister        x 5 sisters   COPD Sister    Hypertension Sister    Colon polyps Sister    Hearing loss Sister    Heart attack Sister     Arthritis Sister    Varicose Veins Sister    COPD Sister    Obesity Sister    Depression Sister    Depression Sister    Heart disease Sister    Hypertension Sister    Hypertension Sister    Obesity Sister    Heart disease Brother        x 3 brothers   Diabetes Brother    Heart attack Brother    Arthritis Brother    Heart disease Brother    Hypertension Brother    Diabetes Brother    Heart disease Brother    Hypertension Brother    Obesity Brother    Varicose Veins Brother    Heart disease Brother    Hypertension Brother    Obesity Brother    Miscarriages / Stillbirths Maternal Aunt    Miscarriages / Stillbirths Paternal Aunt    Breast cancer Paternal Aunt        early years   Cancer Maternal Grandfather    Stroke Maternal Grandfather     Social History: I have reviewed social history from progress note on 05/15/2018. Social History   Socioeconomic History   Marital status: Married    Spouse name: Embry, Manrique (Spouse) [phone] ([place])   Number of children: 1   Years of education: High School   Highest education level: GED or equivalent  Occupational History   Occupation: disability  Tobacco Use   Smoking status: Former    Current packs/day: 0.00    Average packs/day: 0.3 packs/day for 10.0 years (2.5 ttl pk-yrs)    Types: Cigarettes    Start date: 11/28/1985    Quit date: 11/29/1995    Years since quitting: 27.6    Passive exposure: Past   Smokeless tobacco: Former   Tobacco comments:    Quite 1997, didnt smoke hardly at all when I was smoking.  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]    Social Drivers of Corporate investment banker Strain: Low Risk  (06/19/2023)   Overall Financial Resource Strain (CARDIA)    Difficulty of Paying Living Expenses: Not hard at all  Food Insecurity: No Food Insecurity (06/19/2023)   Hunger Vital Sign    Worried About Running Out of Food in the Last Year: Never true    Ran Out of Food in the Last Year: Never true  Transportation Needs: No Transportation Needs (06/19/2023)   PRAPARE - Administrator, Civil Service (Medical): No    Lack of Transportation (Non-Medical): No  Physical Activity: Sufficiently Active (06/19/2023)   Exercise Vital Sign    Days of Exercise per Week: 7 days    Minutes of Exercise per Session: 60 min  Stress: Stress Concern Present (06/19/2023)   [employer] of Occupational Health - Occupational Stress Questionnaire    Feeling of Stress : Very much  Social Connections: Moderately Isolated (06/19/2023)   Social Connection and Isolation Panel    Frequency of Communication with Friends and Family: More than three times a week    Frequency of Social Gatherings with Friends and Family: Once a week    Attends Religious Services: Never    Database administrator or Organizations: No    Attends Banker Meetings: Never    Marital Status: Married    Allergies:  Allergies  Allergen Reactions   Flagyl [Metronidazole] Anaphylaxis   Cymbalta [Duloxetine Hcl] Anxiety   Tape Rash    Paper tape okay to use per patient.    Metabolic Disorder Labs: Lab Results  Component Value Date   HGBA1C 5.1 04/24/2023   MPG 100 04/24/2023   MPG 108 05/01/2022   No results found for: PROLACTIN Lab Results  Component Value Date   CHOL 136 04/24/2023   TRIG 59 04/24/2023   HDL 76 04/24/2023   CHOLHDL 1.8 04/24/2023   LDLCALC 46 04/24/2023   LDLCALC 43 05/01/2022   Lab Results  Component Value Date   TSH 1.61 04/24/2023   TSH 2.11 05/01/2022    Therapeutic Level Labs: No results found for:  LITHIUM No results found for: VALPROATE No results found for: CBMZ  Current Medications: Current Outpatient Medications  Medication Sig Dispense Refill   aspirin  EC (ASPIRIN  LOW DOSE) 81 MG tablet TAKE 1 TABLET BY MOUTH EVERY DAY 90 tablet 0   Azelastine  HCl 137 MCG/SPRAY SOLN PLACE 2 SPRAYS INTO BOTH NOSTRILS 2 (TWO) TIMES DAILY. USE IN EACH NOSTRIL AS DIRECTED 48 mL 1   bisacodyl  (DULCOLAX) 5 MG EC tablet Take 5 mg by mouth daily as needed for moderate constipation.     butalbital -acetaminophen -caffeine  (FIORICET) 50-325-40 MG tablet Take 1 tablet by mouth 2 (two) times daily as needed for headache.     Cholecalciferol 250 MCG (10000 UT) CAPS Take 10,000 Units by mouth once a week.     cyanocobalamin  (VITAMIN B12) 1000 MCG/ML injection INJECT 1 ML (1,000 MCG TOTAL) INTO THE MUSCLE EVERY 30 DAYS. 3 mL 3   diclofenac Sodium (VOLTAREN) 1 % GEL Apply 2 g topically 4 (four) times daily as needed (pain).     estradiol  (ESTRACE ) 1 MG tablet TAKE 1 TABLET BY MOUTH EVERY DAY 90 tablet 0   etodolac  (LODINE ) 500 MG tablet Take 1 tablet by mouth 2 (two) times daily.     ezetimibe  (ZETIA ) 10 MG tablet TAKE 1 TABLET BY MOUTH EVERY DAY 90 tablet  3   fexofenadine  (ALLEGRA ) 180 MG tablet Take 1 tablet (180 mg total) by mouth daily.     fluticasone  (FLONASE ) 50 MCG/ACT nasal spray SPRAY 2 SPRAYS INTO EACH NOSTRIL EVERY DAY 48 mL 0   furosemide  (LASIX ) 20 MG tablet TAKE 1 TABLET ONE TIME DAILY AS NEEDED FOR FLUID OR EDEMA 90 tablet 1   gabapentin  (NEURONTIN ) 600 MG tablet Take 1 tablet (600 mg total) by mouth in the morning, at noon, in the evening, and at bedtime. 360 tablet 3   hydrOXYzine  (VISTARIL ) 50 MG capsule Take 1 capsule (50 mg total) by mouth 2 (two) times daily as needed for anxiety. 180 capsule 1   omeprazole  (PRILOSEC) 40 MG capsule Take 1 capsule (40 mg total) by mouth daily. 90 capsule 3   ondansetron  (ZOFRAN  ODT) 4 MG disintegrating tablet Take 1 tablet (4 mg total) by mouth every 8  (eight) hours as needed for nausea or vomiting. 30 tablet 2   Oxcarbazepine  (TRILEPTAL ) 300 MG tablet Take 1 tablet (300 mg total) by mouth 3 (three) times daily. 90 tablet 1   oxyCODONE -acetaminophen  (PERCOCET) 7.5-325 MG tablet Take 1 tablet by mouth every 6 (six) hours as needed for severe pain.     rizatriptan  (MAXALT -MLT) 10 MG disintegrating tablet Take 1 tablet (10 mg total) by mouth as needed for migraine. May repeat in 2 hours if needed, that is max dose for 24 hours. 10 tablet 3   rosuvastatin  (CRESTOR ) 40 MG tablet TAKE 1 TABLET BY MOUTH EVERY DAY 90 tablet 3   tiZANidine  (ZANAFLEX ) 4 MG tablet TAKE 1 TABLET BY MOUTH EVERY 6 HOURS 120 tablet 5   traZODone  (DESYREL ) 100 MG tablet Take 1-2 tablets (100-200 mg total) by mouth at bedtime as needed for sleep. Has supplies     triamcinolone  ointment (KENALOG ) 0.5 % Apply 1 Application topically 2 (two) times daily. Apply to external ear for 2 weeks 30 g 2   valACYclovir  (VALTREX ) 1000 MG tablet TAKE 1 TABLET (1,000 MG TOTAL) BY MOUTH DAILY. FOR 5-7 DAYS, MAY REPEAT IF NEED FOR COLD SORE (Patient not taking: Reported on 06/25/2023) 90 tablet 1   venlafaxine  XR (EFFEXOR  XR) 150 MG 24 hr capsule Take 1 capsule (150 mg total) by mouth daily with breakfast. DOSE INCREASE 90 capsule 0   Zoledronic  Acid (ZOMETA ) 4 MG/100ML IVPB Once per year from Orthopedic 100 mL    No current facility-administered medications for this visit.     Musculoskeletal: Strength & Muscle Tone: UTA Gait & Station: Seated Patient leans: N/A  Psychiatric Specialty Exam: Review of Systems  Psychiatric/Behavioral:  Positive for sleep disturbance. The patient is nervous/anxious.     There were no vitals taken for this visit.There is no height or weight on file to calculate BMI.  General Appearance: Casual  Eye Contact:  Fair  Speech:  Normal Rate  Volume:  Normal  Mood:  Anxious  Affect:  Congruent  Thought Process:  Goal Directed and Descriptions of Associations:  Intact  Orientation:  Full (Time, Place, and Person)  Thought Content: Logical   Suicidal Thoughts:  No  Homicidal Thoughts:  No  Memory:  Immediate;   Fair Recent;   Fair Remote;   Fair  Judgement:  Fair  Insight:  Fair  Psychomotor Activity:  Normal  Concentration:  Concentration: Fair and Attention Span: Fair  Recall:  [employer] of Knowledge: Fair  Language: Fair  Akathisia:  No  Handed:  Right  AIMS (if indicated): not done  Assets:  Manufacturing systems engineer Desire for Improvement Housing Social Support Transportation  ADL's:  Intact  Cognition: WNL  Sleep:  Poor   Screenings: AIMS    Flowsheet Row Video Visit from 06/13/2021 in [facility] Video Visit from 05/16/2021 in [facility]  AIMS Total Score 0 0   GAD-7    Flowsheet Row Counselor from 06/19/2023 in [facility] Office Visit from 03/14/2023 in [facility] Office Visit from 10/31/2022 in [facility] Office Visit from 10/15/2022 in [facility] Office Visit from 09/13/2022 in [facility]  Total GAD-7 Score 12 3 4 7 12    PHQ2-9    Flowsheet Row Counselor from 06/19/2023 in [facility] Office Visit from 03/14/2023 in [facility] Office Visit from 10/31/2022 in [facility] Office Visit from 10/15/2022 in [facility] Office Visit from 09/13/2022 in [facility]  PHQ-2 Total Score 6 1 2 2 2   PHQ-9 Total Score 17 3 3 3 4    Flowsheet Row Video Visit from 06/28/2023 in [facility] Counselor from 06/19/2023 in [facility] Video Visit  from 05/27/2023 in [facility]  C-SSRS RISK CATEGORY No Risk No Risk No Risk     Assessment and Plan: Ashley Fields is a 69 year old Caucasian female who has a history of PTSD, bipolar disorder, panic disorder, insomnia was evaluated by telemedicine today.  Discussed assessment and plan as noted below.  Insomnia-unstable Currently struggling with sleep problems mostly due to anxiety related to upcoming cardiac procedure.  She is currently on trazodone  200 mg however is not interested in changing the sleep medication and would like to work on sleep hygiene and anxiety symptoms at this time.  She has established care with her therapist and is motivated to stay in therapy Continue Trazodone  200 mg at bedtime Continue Hydroxyzine  as prescribed. Advised to work on sleep hygiene and relaxation techniques.  Panic disorder-unstable Currently struggling with panic symptoms due to upcoming cardiac procedure. Continue CBT with her therapist Continue Venlafaxine  150 mg daily Continue Hydroxyzine  50 mg twice a day as needed  PTSD-unstable Recent anxiety symptoms as well as sleep issues. Continue Venlafaxine  150 mg daily Continue psychotherapy sessions, motivated to stay in therapy  Bipolar disorder type I mixed moderate-unstable Although reports irritability has improved continues to struggle with anxiety and sleep issues. Declines medication changes to address her sleep. Will increase Trileptal  to 300 mg 3 times daily Continue Gabapentin  600 mg 4 times a day.  Could take a higher dosage at bedtime to assist with sleep. Continue Trazodone  as prescribed for now. Reviewed and discussed sodium level dated 06/25/2023-135.  This needs to be monitored while on Oxcarbazepine /Trileptal .  Follow-up Follow-up in clinic in 2 weeks or sooner if needed.   Collaboration of Care: Collaboration of Care: Referral or follow-up with counselor/therapist AEB patient encouraged  to continue CBT, I have reviewed notes per Ms. Veva dated 06/19/2023, patient has upcoming appointment.  Patient/Guardian was advised Release of Information must be obtained prior to any record release in order to collaborate their care with an outside provider. Patient/Guardian was advised if they have not already done so to contact the registration department to sign all necessary forms in  order for us  to release information regarding their care.   Consent: Patient/Guardian gives verbal consent for treatment and assignment of benefits for services provided during this visit. Patient/Guardian expressed understanding and agreed to proceed.   This note was generated in part or whole with voice recognition software. Voice recognition is usually quite accurate but there are transcription errors that can and very often do occur. I apologize for any typographical errors that were not detected and corrected.    Dera Vanaken, MD 06/29/2023, 8:22 AM

## 2023-06-28 NOTE — Telephone Encounter (Signed)
 Patient is returning call. Please advise?

## 2023-06-28 NOTE — Telephone Encounter (Signed)
 Reviewed instructions for heart cath with patient - no further questions

## 2023-07-02 ENCOUNTER — Encounter: Payer: Self-pay | Admitting: Internal Medicine

## 2023-07-02 ENCOUNTER — Ambulatory Visit
Admission: RE | Admit: 2023-07-02 | Discharge: 2023-07-02 | Disposition: A | Discharge status: Home / Self Care | Attending: Internal Medicine | Admitting: Internal Medicine

## 2023-07-02 ENCOUNTER — Other Ambulatory Visit: Payer: Self-pay

## 2023-07-02 ENCOUNTER — Encounter
Admission: RE | Disposition: A | Discharge status: Home / Self Care | Source: Home / Self Care | Attending: Internal Medicine

## 2023-07-02 DIAGNOSIS — E782 Mixed hyperlipidemia: Secondary | ICD-10-CM | POA: Diagnosis not present

## 2023-07-02 DIAGNOSIS — Z9884 Bariatric surgery status: Secondary | ICD-10-CM | POA: Insufficient documentation

## 2023-07-02 DIAGNOSIS — I2510 Atherosclerotic heart disease of native coronary artery without angina pectoris: Secondary | ICD-10-CM | POA: Diagnosis not present

## 2023-07-02 DIAGNOSIS — Z87891 Personal history of nicotine dependence: Secondary | ICD-10-CM | POA: Insufficient documentation

## 2023-07-02 DIAGNOSIS — F319 Bipolar disorder, unspecified: Secondary | ICD-10-CM | POA: Diagnosis not present

## 2023-07-02 DIAGNOSIS — I2584 Coronary atherosclerosis due to calcified coronary lesion: Secondary | ICD-10-CM | POA: Diagnosis not present

## 2023-07-02 DIAGNOSIS — I700 Atherosclerosis of aorta: Secondary | ICD-10-CM | POA: Diagnosis not present

## 2023-07-02 DIAGNOSIS — K219 Gastro-esophageal reflux disease without esophagitis: Secondary | ICD-10-CM | POA: Diagnosis not present

## 2023-07-02 DIAGNOSIS — I959 Hypotension, unspecified: Secondary | ICD-10-CM | POA: Insufficient documentation

## 2023-07-02 DIAGNOSIS — Z7982 Long term (current) use of aspirin: Secondary | ICD-10-CM | POA: Diagnosis not present

## 2023-07-02 DIAGNOSIS — Z79899 Other long term (current) drug therapy: Secondary | ICD-10-CM | POA: Diagnosis not present

## 2023-07-02 DIAGNOSIS — R9439 Abnormal result of other cardiovascular function study: Secondary | ICD-10-CM | POA: Diagnosis not present

## 2023-07-02 HISTORY — PX: LEFT HEART CATH AND CORONARY ANGIOGRAPHY: CATH118249

## 2023-07-02 SURGERY — LEFT HEART CATH AND CORONARY ANGIOGRAPHY
Anesthesia: Moderate Sedation | Laterality: Left

## 2023-07-02 MED ORDER — HEPARIN SODIUM (PORCINE) 1000 UNIT/ML IJ SOLN
INTRAMUSCULAR | Status: DC | PRN
  Administered 2023-07-02: 3500 [IU] via INTRAVENOUS

## 2023-07-02 MED ORDER — HEPARIN (PORCINE) IN NACL 1000-0.9 UT/500ML-% IV SOLN
INTRAVENOUS | Status: DC | PRN
Start: 2023-07-02 — End: 2023-07-02
  Administered 2023-07-02: 1000 mL

## 2023-07-02 MED ORDER — VERAPAMIL HCL 2.5 MG/ML IV SOLN
INTRAVENOUS | Status: DC | PRN
  Administered 2023-07-02 (×2): 2.5 mg via INTRAVENOUS

## 2023-07-02 MED ORDER — ACETAMINOPHEN 325 MG PO TABS: 650.0000 mg | ORAL_TABLET | ORAL | Status: DC | PRN

## 2023-07-02 MED ORDER — SODIUM CHLORIDE 0.9 % IV SOLN: 250.0000 mL | INTRAVENOUS | Status: DC | PRN

## 2023-07-02 MED ORDER — SODIUM CHLORIDE 0.9% FLUSH: 3.0000 mL | Freq: Two times a day (BID) | INTRAVENOUS | Status: DC

## 2023-07-02 MED ORDER — ASPIRIN 81 MG PO CHEW: 81.0000 mg | CHEWABLE_TABLET | ORAL | Status: DC

## 2023-07-02 MED ORDER — SODIUM CHLORIDE 0.9% FLUSH: 3.0000 mL | INTRAVENOUS | Status: DC | PRN

## 2023-07-02 MED ORDER — FENTANYL CITRATE (PF) 100 MCG/2ML IJ SOLN
INTRAMUSCULAR | Status: AC
  Filled 2023-07-02: qty 2

## 2023-07-02 MED ORDER — LIDOCAINE HCL 1 % IJ SOLN
INTRAMUSCULAR | Status: AC
Start: 2023-07-02 — End: 2023-07-02
  Filled 2023-07-02: qty 20

## 2023-07-02 MED ORDER — LABETALOL HCL 5 MG/ML IV SOLN: 10.0000 mg | INTRAVENOUS | Status: DC | PRN

## 2023-07-02 MED ORDER — HYDRALAZINE HCL 20 MG/ML IJ SOLN: 10.0000 mg | INTRAMUSCULAR | Status: DC | PRN

## 2023-07-02 MED ORDER — ONDANSETRON HCL 4 MG/2ML IJ SOLN: 4.0000 mg | Freq: Four times a day (QID) | INTRAMUSCULAR | Status: DC | PRN

## 2023-07-02 MED ORDER — SODIUM CHLORIDE 0.9 % IV SOLN: INTRAVENOUS | Status: DC

## 2023-07-02 MED ORDER — MIDAZOLAM HCL 2 MG/2ML IJ SOLN
INTRAMUSCULAR | Status: AC
  Filled 2023-07-02: qty 2

## 2023-07-02 MED ORDER — IOHEXOL 300 MG/ML  SOLN
INTRAMUSCULAR | Status: DC | PRN
Start: 2023-07-02 — End: 2023-07-02
  Administered 2023-07-02: 50 mL

## 2023-07-02 MED ORDER — FENTANYL CITRATE (PF) 100 MCG/2ML IJ SOLN
INTRAMUSCULAR | Status: DC | PRN
  Administered 2023-07-02: 25 ug via INTRAVENOUS

## 2023-07-02 MED ORDER — VERAPAMIL HCL 2.5 MG/ML IV SOLN
INTRAVENOUS | Status: AC
  Filled 2023-07-02: qty 2

## 2023-07-02 MED ORDER — MIDAZOLAM HCL 2 MG/2ML IJ SOLN
INTRAMUSCULAR | Status: DC | PRN
Start: 2023-07-02 — End: 2023-07-02
  Administered 2023-07-02: 1 mg via INTRAVENOUS

## 2023-07-02 MED ORDER — HEPARIN SODIUM (PORCINE) 1000 UNIT/ML IJ SOLN
INTRAMUSCULAR | Status: AC
  Filled 2023-07-02: qty 10

## 2023-07-02 MED ORDER — LIDOCAINE HCL (PF) 1 % IJ SOLN
INTRAMUSCULAR | Status: DC | PRN
  Administered 2023-07-02: 2 mL

## 2023-07-02 MED ORDER — HEPARIN (PORCINE) IN NACL 1000-0.9 UT/500ML-% IV SOLN
INTRAVENOUS | Status: AC
Start: 2023-07-02 — End: 2023-07-02
  Filled 2023-07-02: qty 1000

## 2023-07-02 SURGICAL SUPPLY — 10 items
CATH INFINITI 5FR ANG PIGTAIL (CATHETERS) IMPLANT
CATH INFINITI AMBI 5FR TG (CATHETERS) IMPLANT
DEVICE RAD TR BAND REGULAR (VASCULAR PRODUCTS) IMPLANT
DRAPE BRACHIAL (DRAPES) IMPLANT
GLIDESHEATH SLEND SS 6F .021 (SHEATH) IMPLANT
GUIDEWIRE INQWIRE 1.5J.035X260 (WIRE) IMPLANT
KIT SYRINGE INJ CVI SPIKEX1 (MISCELLANEOUS) IMPLANT
PACK CARDIAC CATH (CUSTOM PROCEDURE TRAY) ×1 IMPLANT
SET ATX-X65L (MISCELLANEOUS) IMPLANT
STATION PROTECTION PRESSURIZED (MISCELLANEOUS) IMPLANT

## 2023-07-02 NOTE — Interval H&P Note (Signed)
 History and Physical Interval Note:  07/02/2023 9:21 AM  Ashley Fields  has presented today for surgery, with the diagnosis of coronary artery calcification and abnormal stress test.  The various methods of treatment have been discussed with the patient and family. After consideration of risks, benefits and other options for treatment, the patient has consented to  Procedure(s): LEFT HEART CATH AND CORONARY ANGIOGRAPHY (Left) as a surgical intervention.  The patient's history has been reviewed, patient examined, no change in status, stable for surgery.  I have reviewed the patient's chart and labs.  Questions were answered to the patient's satisfaction.    Cath Lab Visit (complete for each Cath Lab visit)  Clinical Evaluation Leading to the Procedure:   ACS: No.  Non-ACS:    Anginal Classification: CCS I  Anti-ischemic medical therapy: No Therapy  Non-Invasive Test Results: Low-risk stress test findings: cardiac mortality <1%/year  Prior CABG: No previous CABG  Elvie Maines

## 2023-07-12 ENCOUNTER — Other Ambulatory Visit: Payer: Self-pay | Admitting: Family Medicine

## 2023-07-12 DIAGNOSIS — J3089 Other allergic rhinitis: Secondary | ICD-10-CM

## 2023-07-14 ENCOUNTER — Other Ambulatory Visit: Payer: Self-pay | Admitting: Family Medicine

## 2023-07-14 DIAGNOSIS — T3695XA Adverse effect of unspecified systemic antibiotic, initial encounter: Secondary | ICD-10-CM

## 2023-07-14 DIAGNOSIS — B379 Candidiasis, unspecified: Secondary | ICD-10-CM

## 2023-07-15 ENCOUNTER — Other Ambulatory Visit: Payer: Self-pay | Admitting: Family Medicine

## 2023-07-15 DIAGNOSIS — G8929 Other chronic pain: Secondary | ICD-10-CM

## 2023-07-16 ENCOUNTER — Other Ambulatory Visit: Payer: Self-pay | Admitting: Family Medicine

## 2023-07-16 DIAGNOSIS — H6993 Unspecified Eustachian tube disorder, bilateral: Secondary | ICD-10-CM

## 2023-07-16 DIAGNOSIS — J329 Chronic sinusitis, unspecified: Secondary | ICD-10-CM

## 2023-07-16 NOTE — Telephone Encounter (Signed)
 Requested Prescriptions  Refused Prescriptions Disp Refills   fluconazole  (DIFLUCAN ) 150 MG tablet [Pharmacy Med Name: FLUCONAZOLE  150 MG TABLET] 2 tablet 0    Sig: TAKE ONE TABLET BY MOUTH ON DAY 1. REPEAT DOSE 2ND TABLET ON DAY 3.     Off-Protocol Failed - 07/16/2023  3:17 PM      Failed - Medication not assigned to a protocol, review manually.      Passed - Valid encounter within last 12 months    Recent Outpatient Visits           2 months ago Annual physical exam   [facility] [place], Marsa PARAS, DO   4 months ago Hypotension, unspecified hypotension type   [facility] [place], Marsa PARAS, DO   5 months ago Acute non-recurrent frontal sinusitis   [facility] [place], Marsa PARAS, DO       Future Appointments             In 6 days Gerard Frederick, NP [facility] [place]   In 3 months [place], Marsa PARAS, DO [facility]

## 2023-07-16 NOTE — Telephone Encounter (Signed)
 Requested medication (s) are due for refill today: yes  Requested medication (s) are on the active medication list: yes  Last refill:  01/28/23 #120 5 RF  Future visit scheduled: no  Notes to clinic:  med not delegated to NT to RF   Requested Prescriptions  Pending Prescriptions Disp Refills   tiZANidine  (ZANAFLEX ) 4 MG tablet [Pharmacy Med Name: TIZANIDINE  HCL 4 MG TABLET] 120 tablet 5    Sig: TAKE 1 TABLET BY MOUTH EVERY 6 HOURS     Not Delegated - Cardiovascular:  Alpha-2 Agonists - tizanidine  Failed - 07/16/2023  3:56 PM      Failed - This refill cannot be delegated      Passed - Valid encounter within last 6 months    Recent Outpatient Visits           2 months ago Annual physical exam   [facility] [place], Marsa PARAS, DO   4 months ago Hypotension, unspecified hypotension type   [facility] [place], Marsa PARAS, DO   5 months ago Acute non-recurrent frontal sinusitis   [facility] [place], Marsa PARAS, DO       Future Appointments             In 6 days Gerard Frederick, NP [facility] [place]   In 3 months Edman, Marsa PARAS, DO [facility], [facility]

## 2023-07-16 NOTE — Telephone Encounter (Signed)
 Requested Prescriptions  Pending Prescriptions Disp Refills   fluticasone  (FLONASE ) 50 MCG/ACT nasal spray [Pharmacy Med Name: FLUTICASONE  PROP 50 MCG SPRAY] 48 mL 0    Sig: SPRAY 2 SPRAYS INTO EACH NOSTRIL EVERY DAY     Ear, Nose, and Throat: Nasal Preparations - Corticosteroids Passed - 07/16/2023 10:42 AM      Passed - Valid encounter within last 12 months    Recent Outpatient Visits           2 months ago Annual physical exam   [facility] [place], Marsa PARAS, DO   4 months ago Hypotension, unspecified hypotension type   [facility] [place], Marsa PARAS, DO   5 months ago Acute non-recurrent frontal sinusitis   [facility] [place], Marsa PARAS, DO       Future Appointments             In 6 days Gerard Frederick, NP [facility] [place]   In 3 months Edman, Marsa PARAS, DO [facility], [facility]

## 2023-07-17 DIAGNOSIS — Z87891 Personal history of nicotine dependence: Secondary | ICD-10-CM | POA: Diagnosis not present

## 2023-07-17 DIAGNOSIS — Z7982 Long term (current) use of aspirin: Secondary | ICD-10-CM | POA: Diagnosis not present

## 2023-07-17 DIAGNOSIS — E785 Hyperlipidemia, unspecified: Secondary | ICD-10-CM | POA: Diagnosis not present

## 2023-07-17 DIAGNOSIS — I700 Atherosclerosis of aorta: Secondary | ICD-10-CM | POA: Diagnosis not present

## 2023-07-17 DIAGNOSIS — Z833 Family history of diabetes mellitus: Secondary | ICD-10-CM | POA: Diagnosis not present

## 2023-07-17 DIAGNOSIS — Z8249 Family history of ischemic heart disease and other diseases of the circulatory system: Secondary | ICD-10-CM | POA: Diagnosis not present

## 2023-07-17 DIAGNOSIS — F319 Bipolar disorder, unspecified: Secondary | ICD-10-CM | POA: Diagnosis not present

## 2023-07-17 DIAGNOSIS — M461 Sacroiliitis, not elsewhere classified: Secondary | ICD-10-CM | POA: Diagnosis not present

## 2023-07-17 DIAGNOSIS — F419 Anxiety disorder, unspecified: Secondary | ICD-10-CM | POA: Diagnosis not present

## 2023-07-17 DIAGNOSIS — I739 Peripheral vascular disease, unspecified: Secondary | ICD-10-CM | POA: Diagnosis not present

## 2023-07-17 DIAGNOSIS — K219 Gastro-esophageal reflux disease without esophagitis: Secondary | ICD-10-CM | POA: Diagnosis not present

## 2023-07-17 DIAGNOSIS — M3500 Sicca syndrome, unspecified: Secondary | ICD-10-CM | POA: Diagnosis not present

## 2023-07-18 NOTE — Telephone Encounter (Signed)
 Requested Prescriptions  Pending Prescriptions Disp Refills   Azelastine  HCl 137 MCG/SPRAY SOLN [Pharmacy Med Name: AZELASTINE  0.1% (137 MCG) SPRY] 48 mL 1    Sig: PLACE 2 SPRAYS INTO BOTH NOSTRILS 2 (TWO) TIMES DAILY. USE IN EACH NOSTRIL AS DIRECTED     Ear, Nose, and Throat: Nasal Preparations - Antiallergy Passed - 07/18/2023 11:52 AM      Passed - Valid encounter within last 12 months    Recent Outpatient Visits           2 months ago Annual physical exam   [facility] [place], Marsa PARAS, DO   4 months ago Hypotension, unspecified hypotension type   [facility] [place], Marsa PARAS, DO   5 months ago Acute non-recurrent frontal sinusitis   [facility] [place], Marsa PARAS, DO       Future Appointments             In 4 days Gerard Frederick, NP [facility] [place]   In 3 months Edman, Marsa PARAS, DO [facility], [facility]

## 2023-07-22 ENCOUNTER — Telehealth (INDEPENDENT_AMBULATORY_CARE_PROVIDER_SITE_OTHER): Admitting: Psychiatry

## 2023-07-22 ENCOUNTER — Ambulatory Visit: Attending: Cardiology | Admitting: Cardiology

## 2023-07-22 ENCOUNTER — Encounter: Payer: Self-pay | Admitting: Cardiology

## 2023-07-22 ENCOUNTER — Encounter: Payer: Self-pay | Admitting: Psychiatry

## 2023-07-22 VITALS — BP 100/60 | HR 66 | Ht 62.0 in | Wt 148.0 lb

## 2023-07-22 DIAGNOSIS — R52 Pain, unspecified: Secondary | ICD-10-CM | POA: Diagnosis not present

## 2023-07-22 DIAGNOSIS — F410 Panic disorder [episodic paroxysmal anxiety] without agoraphobia: Secondary | ICD-10-CM

## 2023-07-22 DIAGNOSIS — G4701 Insomnia due to medical condition: Secondary | ICD-10-CM | POA: Diagnosis not present

## 2023-07-22 DIAGNOSIS — H34211 Partial retinal artery occlusion, right eye: Secondary | ICD-10-CM

## 2023-07-22 DIAGNOSIS — R0989 Other specified symptoms and signs involving the circulatory and respiratory systems: Secondary | ICD-10-CM

## 2023-07-22 DIAGNOSIS — E782 Mixed hyperlipidemia: Secondary | ICD-10-CM

## 2023-07-22 DIAGNOSIS — I2510 Atherosclerotic heart disease of native coronary artery without angina pectoris: Secondary | ICD-10-CM | POA: Diagnosis not present

## 2023-07-22 DIAGNOSIS — Z79899 Other long term (current) drug therapy: Secondary | ICD-10-CM | POA: Diagnosis not present

## 2023-07-22 DIAGNOSIS — F4310 Post-traumatic stress disorder, unspecified: Secondary | ICD-10-CM

## 2023-07-22 DIAGNOSIS — F3178 Bipolar disorder, in full remission, most recent episode mixed: Secondary | ICD-10-CM | POA: Diagnosis not present

## 2023-07-22 DIAGNOSIS — R011 Cardiac murmur, unspecified: Secondary | ICD-10-CM

## 2023-07-22 NOTE — Progress Notes (Addendum)
 Virtual Visit via Video Note  I connected with Ashley Fields on 07/22/23 at  4:20 PM EDT by a video enabled telemedicine application and verified that I am speaking with the correct person using two identifiers.  Location Provider Location : [facility] Patient Location : Home  Participants: Patient , Provider  I discussed the limitations of evaluation and management by telemedicine and the availability of in person appointments. The patient expressed understanding and agreed to proceed.   I discussed the assessment and treatment plan with the patient. The patient was provided an opportunity to ask questions and all were answered. The patient agreed with the plan and demonstrated an understanding of the instructions.   The patient was advised to call back or seek an in-person evaluation if the symptoms worsen or if the condition fails to improve as anticipated.   BH MD OP Progress Note  07/24/2023 10:56 AM Ashley Fields  MRN:  [id number]  Chief Complaint:  Chief Complaint  Patient presents with   Follow-up   Anxiety   Depression   Medication Refill   Discussed the use of AI scribe software for clinical note transcription with the patient, who gave verbal consent to proceed.  History of Present Illness Ashley Fields is a 68 year old Caucasian female, married, on disability, lives in [place], has a history of bipolar disorder, PTSD, panic disorder, insomnia, chronic pain, migraine headaches, history of laparoscopic gastric bypass was evaluated by telemedicine today.  She experiences irritability and anxiety, which she attributes to spending more time with her husband since his retirement. She also experiences situational depression related to her current living situation and wants to return to [place] , where she plans to visit in September. She feels stressed and irritable but has no thoughts of self-harm or harm to others.  She takes Trazodone  to aid her sleep, which is effective only when  taken. She is also on Oxcarbazepine  (Trileptal ) for mood stabilization. She prefers hydroxyzine  tablets over capsules. She is in contact with her therapist, with upcoming sessions scheduled for the end of July and August.  She is planning a trip to [place]  in September to visit her sister, Ashley Fields, and feels anxious about flying, as she has only flown once before. She is not currently planning to move back to [place]  permanently.  Denies any other concerns today.   Visit Diagnosis:    ICD-10-CM   1. Bipolar disorder, in full remission, most recent episode mixed (HCC)  F31.78    Type I    2. PTSD (post-traumatic stress disorder)  F43.10     3. Panic disorder  F41.0     4. Insomnia due to medical condition  G47.01    mood , lack of sleep hygiene, pain      Past Psychiatric History: I have reviewed past psychiatric history from progress note on 05/15/2018.  Past trials of Prozac, Wellbutrin, Lexapro, Cymbalta, Rexulti, Ambien -nightmares, Lunesta , Lamictal , risperidone .  Past Medical History:  Past Medical History:  Diagnosis Date   Abdominal wall seroma (postoperative) 2025   Allergy    Anxiety    Aortic atherosclerosis (HCC)    Arthritis    Back pain    a.) s/p L4-L5 fusion 2008; b.) s/p PLIF L2-L3 and ALIF L5-S1 12/2021   Bilateral carotid artery disease (HCC)    Bipolar disorder (HCC)    Blood transfusion without reported diagnosis 1977   Transfusions from miscarriage & hemorrhaging   CAD (coronary artery disease)    Chronic kidney disease  DDD (degenerative disc disease), cervical    a.) s/p ACDF C4-C7   Depression    Disp fx of cuboid bone of right foot, init for clos fx 01/01/2017   Displacement of breast implant 2025   Fibromyalgia    GERD (gastroesophageal reflux disease)    Headache    Hepatitis C virus infection cured after antiviral drug therapy    a.) s/p Tx with ledipasvir/sofosbuvir   History of 2019 novel coronavirus disease (COVID-19) 01/23/2020    Hollenhorst plaque, right eye    Hyperlipidemia    Insomnia    a.) uses trazodone  PRN   Iron  deficiency anemia following bariatric surgery    Long-term use of aspirin  therapy    Lumbar radiculopathy    Neck pain    Neuromuscular disorder (HCC) 1997   Falling to much was an eye-opener   Osteoporosis    PSVT (paroxysmal supraventricular tachycardia) (HCC)    Systolic murmur    Thyroid  disease    Urine incontinence     Past Surgical History:  Procedure Laterality Date   5 miscarriages     1977-1985, Blood transfusion s/p miscarriage 1977   ABDOMINAL EXPOSURE N/A 12/18/2021   Procedure: ABDOMINAL EXPOSURE;  Surgeon: Gretta Lonni PARAS, MD;  [facility];  Service: Vascular;  Laterality: N/A;   ACDF C4-C7  02/10/2015   ANTERIOR LUMBAR FUSION N/A 12/18/2021   Procedure: Anterior Lumbar Interbody Fusion  - Lumbar Five-Sacral One;  Surgeon: Joshua Alm RAMAN, MD;  Location: [facility] OR;  Service: Neurosurgery;  Laterality: N/A;   APPENDECTOMY  12/2008   AUGMENTATION MAMMAPLASTY Bilateral 2015   AUGMENTATION MAMMAPLASTY Bilateral 2018   implants redone w/ placement of implants under muscle   breast lift bilateral, implants  05/18/2015   bilateral, silicon naturel   BREAST REDUCTION SURGERY Bilateral 1997   CESAREAN SECTION  07/27/1981   Placenta Previa   COLONOSCOPY WITH PROPOFOL  N/A 06/13/2020   Procedure: COLONOSCOPY WITH PROPOFOL ;  Surgeon: Janalyn Keene NOVAK, MD;  Location: [facility] ENDOSCOPY;  Service: Endoscopy;  Laterality: N/A;   COSMETIC SURGERY     Breast implants w/ lift, tummy tuck, upper & lower Blepharop   DILATION AND CURETTAGE OF UTERUS     ESOPHAGOGASTRODUODENOSCOPY (EGD) WITH PROPOFOL  N/A 06/13/2020   Procedure: ESOPHAGOGASTRODUODENOSCOPY (EGD) WITH PROPOFOL ;  Surgeon: Janalyn Keene NOVAK, MD;  Location: [facility] ENDOSCOPY;  Service: Endoscopy;  Laterality: N/A;   EXCISION OF ABDOMINAL WALL TUMOR N/A 04/08/2023   Procedure: EXCISION, NEOPLASM, ABDOMINAL WALL;  Surgeon:  Rodolph Romano, MD;  Location: [facility] ORS;  Service: General;  Laterality: N/A;   EYE SURGERY  2017   Cataract surgery & lasik   GASTRIC BYPASS  12/24/2003   IVC FILTER INSERTION  12/2003   TrapEase Vena Cava Filter   LAPAROSCOPIC CHOLECYSTECTOMY N/A 03/2004   LEFT HEART CATH AND CORONARY ANGIOGRAPHY Left 07/02/2023   Procedure: LEFT HEART CATH AND CORONARY ANGIOGRAPHY;  Surgeon: Mady Lonni, MD;  Location: [facility] INVASIVE CV LAB;  Service: Cardiovascular;  Laterality: Left;   LUMBAR LAMINECTOMY  06/2006   L4-L5 (spinal fusion)   mini tummy tuck  05/07/2016   Bilateral bra/back roll lift skin removal   REDUCTION MAMMAPLASTY Bilateral 1997   SHOULDER ACROMIOPLASTY Left 03/27/2013   w/ labral debridement   SPINAL CORD STIMULATOR IMPLANT  11/2010   removed 05/2011   TOTAL ABDOMINAL HYSTERECTOMY N/A 1987    Family Psychiatric History: I have reviewed family psychiatric history from progress note on 05/15/2018.  Family History:  Family History  Problem  Relation Age of Onset   COPD Mother    Lung cancer Mother 78   Diabetes Mother    Heart disease Mother    Stroke Mother    Heart attack Mother    Arthritis Mother    Hypertension Mother    Obesity Mother    Varicose Veins Mother    Heart disease Father    Heart attack Father 30   Early death Father    Depression Sister        x 5 sisters   COPD Sister    Hypertension Sister    Colon polyps Sister    Hearing loss Sister    Heart attack Sister    Arthritis Sister    Varicose Veins Sister    COPD Sister    Obesity Sister    Depression Sister    Depression Sister    Heart disease Sister    Hypertension Sister    Hypertension Sister    Obesity Sister    Heart disease Brother        x 3 brothers   Diabetes Brother    Heart attack Brother    Arthritis Brother    Heart disease Brother    Hypertension Brother    Diabetes Brother    Heart disease Brother    Hypertension Brother    Obesity Brother    Varicose  Veins Brother    Heart disease Brother    Hypertension Brother    Obesity Brother    Miscarriages / Stillbirths Maternal Aunt    Miscarriages / Stillbirths Paternal Aunt    Breast cancer Paternal Aunt        early years   Cancer Maternal Grandfather    Stroke Maternal Grandfather     Social History: I have reviewed social history from progress note on 05/15/2018. Social History   Socioeconomic History   Marital status: Married    Spouse name: Ashley Fields, Ashley Fields (Spouse) [phone] ([place])   Number of children: 1   Years of education: High School   Highest education level: GED or equivalent  Occupational History   Occupation: disability  Tobacco Use   Smoking status: Former    Current packs/day: 0.00    Average packs/day: 0.3 packs/day for 10.0 years (2.5 ttl pk-yrs)    Types: Cigarettes    Start date: 11/28/1985    Quit date: 11/29/1995    Years since quitting: 27.6    Passive exposure: Past   Smokeless tobacco: Former   Tobacco comments:    Quite 1997, didnt smoke hardly at all when I was smoking.  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]   Social Drivers of Corporate investment banker Strain: Low Risk  (06/19/2023)   Overall Financial Resource Strain (CARDIA)    Difficulty of Paying Living Expenses: Not hard at all  Food Insecurity: No Food Insecurity (06/19/2023)   Hunger Vital Sign    Worried About Running Out of Food in the Last Year: Never true    Ran Out of Food in the Last Year: Never true  Transportation Needs: No Transportation Needs (06/19/2023)  PRAPARE - Administrator, Civil Service  (Medical): No    Lack of Transportation (Non-Medical): No  Physical Activity: Sufficiently Active (06/19/2023)   Exercise Vital Sign    Days of Exercise per Week: 7 days    Minutes of Exercise per Session: 60 min  Stress: Stress Concern Present (06/19/2023)   [employer] of Occupational Health - Occupational Stress Questionnaire    Feeling of Stress : Very much  Social Connections: Moderately Isolated (06/19/2023)   Social Connection and Isolation Panel    Frequency of Communication with Friends and Family: More than three times a week    Frequency of Social Gatherings with Friends and Family: Once a week    Attends Religious Services: Never    Database administrator or Organizations: No    Attends Banker Meetings: Never    Marital Status: Married    Allergies:  Allergies  Allergen Reactions   Flagyl [Metronidazole] Anaphylaxis   Cymbalta [Duloxetine Hcl] Anxiety   Tape Rash    Paper tape okay to use per patient.    Metabolic Disorder Labs: Lab Results  Component Value Date   HGBA1C 5.1 04/24/2023   MPG 100 04/24/2023   MPG 108 05/01/2022   No results found for: PROLACTIN Lab Results  Component Value Date   CHOL 136 04/24/2023   TRIG 59 04/24/2023   HDL 76 04/24/2023   CHOLHDL 1.8 04/24/2023   LDLCALC 46 04/24/2023   LDLCALC 43 05/01/2022   Lab Results  Component Value Date   TSH 1.61 04/24/2023   TSH 2.11 05/01/2022    Therapeutic Level Labs: No results found for: LITHIUM No results found for: VALPROATE No results found for: CBMZ  Current Medications: Current Outpatient Medications  Medication Sig Dispense Refill   aspirin  EC (ASPIRIN  LOW DOSE) 81 MG tablet TAKE 1 TABLET BY MOUTH EVERY DAY 90 tablet 0   Azelastine  HCl 137 MCG/SPRAY SOLN PLACE 2 SPRAYS INTO BOTH NOSTRILS 2 (TWO) TIMES DAILY. USE IN EACH NOSTRIL AS DIRECTED 48 mL 1   bisacodyl  (DULCOLAX) 5 MG EC tablet Take 5 mg by mouth daily as needed for moderate constipation.      butalbital -acetaminophen -caffeine  (FIORICET) 50-325-40 MG tablet Take 1 tablet by mouth 2 (two) times daily as needed for headache.     CALCIUM  PO Take 1 tablet by mouth 2 (two) times daily. Mini     Cholecalciferol 250 MCG (10000 UT) CAPS Take 10,000 Units by mouth once a week.     cyanocobalamin  (VITAMIN B12) 1000 MCG/ML injection INJECT 1 ML (1,000 MCG TOTAL) INTO THE MUSCLE EVERY 30 DAYS. 3 mL 3   diclofenac Sodium (VOLTAREN) 1 % GEL Apply 2 g topically 4 (four) times daily as needed (pain).     estradiol  (ESTRACE ) 1 MG tablet TAKE 1 TABLET BY MOUTH EVERY DAY 90 tablet 0   etodolac  (LODINE ) 500 MG tablet Take 1 tablet by mouth 2 (two) times daily.     Evolocumab  (REPATHA  SURECLICK) 140 MG/ML SOAJ Inject 140 mg into the skin every 14 (fourteen) days. 6 mL 3   ezetimibe  (ZETIA ) 10 MG tablet TAKE 1 TABLET BY MOUTH EVERY DAY 90 tablet 3   fexofenadine  (ALLEGRA ) 180 MG tablet Take 1 tablet (180 mg total) by mouth daily.     fluticasone  (FLONASE ) 50 MCG/ACT nasal spray SPRAY 2 SPRAYS INTO EACH NOSTRIL EVERY DAY 48 mL 0   furosemide  (LASIX ) 20 MG tablet TAKE 1 TABLET ONE TIME DAILY AS NEEDED FOR  FLUID OR EDEMA 90 tablet 1   gabapentin  (NEURONTIN ) 600 MG tablet Take 1 tablet (600 mg total) by mouth in the morning, at noon, in the evening, and at bedtime. 360 tablet 3   hydrOXYzine  (VISTARIL ) 50 MG capsule Take 1 capsule (50 mg total) by mouth 2 (two) times daily as needed for anxiety. 180 capsule 1   Multiple Vitamins-Minerals (MULTIVITAMIN WITH MINERALS) tablet Take 1 tablet by mouth daily.     omeprazole  (PRILOSEC) 40 MG capsule Take 1 capsule (40 mg total) by mouth daily. 90 capsule 3   ondansetron  (ZOFRAN  ODT) 4 MG disintegrating tablet Take 1 tablet (4 mg total) by mouth every 8 (eight) hours as needed for nausea or vomiting. 30 tablet 2   Oxcarbazepine  (TRILEPTAL ) 300 MG tablet Take 1 tablet (300 mg total) by mouth 3 (three) times daily. (Patient taking differently: Take 150-300 mg by mouth See  admin instructions. Take 300 mg in the morning and bedtime, Take 150 mg at noon) 90 tablet 1   oxyCODONE -acetaminophen  (PERCOCET) 7.5-325 MG tablet Take 1 tablet by mouth every 6 (six) hours.     RESTASIS 0.05 % ophthalmic emulsion Place 1 drop into both eyes 2 (two) times daily.     rizatriptan  (MAXALT -MLT) 10 MG disintegrating tablet Take 1 tablet (10 mg total) by mouth as needed for migraine. May repeat in 2 hours if needed, that is max dose for 24 hours. 10 tablet 3   rosuvastatin  (CRESTOR ) 40 MG tablet TAKE 1 TABLET BY MOUTH EVERY DAY 90 tablet 3   tiZANidine  (ZANAFLEX ) 4 MG tablet Take 1 tablet (4 mg total) by mouth 4 (four) times daily. 120 tablet 5   traZODone  (DESYREL ) 100 MG tablet Take 1-2 tablets (100-200 mg total) by mouth at bedtime as needed for sleep. Has supplies     triamcinolone  ointment (KENALOG ) 0.5 % Apply 1 Application topically 2 (two) times daily. Apply to external ear for 2 weeks 30 g 2   valACYclovir  (VALTREX ) 1000 MG tablet TAKE 1 TABLET (1,000 MG TOTAL) BY MOUTH DAILY. FOR 5-7 DAYS, MAY REPEAT IF NEED FOR COLD SORE (Patient taking differently: Take 1,000 mg by mouth daily as needed (For 5-7 days, may repeat if need for cold sore).) 90 tablet 1   venlafaxine  XR (EFFEXOR  XR) 150 MG 24 hr capsule Take 1 capsule (150 mg total) by mouth daily with breakfast. DOSE INCREASE 90 capsule 0   Zoledronic  Acid (ZOMETA ) 4 MG/100ML IVPB Once per year from Orthopedic 100 mL    No current facility-administered medications for this visit.     Musculoskeletal: Strength & Muscle Tone: UTA Gait & Station: Seated Patient leans: N/A  Psychiatric Specialty Exam: Review of Systems  Psychiatric/Behavioral:  The patient is nervous/anxious.     There were no vitals taken for this visit.There is no height or weight on file to calculate BMI.  General Appearance: Casual  Eye Contact:  Fair  Speech:  Clear and Coherent  Volume:  Normal  Mood:  Anxious  Affect:  Appropriate  Thought  Process:  Goal Directed and Descriptions of Associations: Intact  Orientation:  Full (Time, Place, and Person)  Thought Content: Logical   Suicidal Thoughts:  No  Homicidal Thoughts:  No  Memory:  Immediate;   Fair Recent;   Fair Remote;   Fair  Judgement:  Fair  Insight:  Fair  Psychomotor Activity:  Normal  Concentration:  Concentration: Fair and Attention Span: Fair  Recall:  [employer] of Knowledge: Fair  Language: Fair  Akathisia:  No  Handed:  Right  AIMS (if indicated): not done  Assets:  Communication Skills Desire for Improvement Housing Social Support Transportation  ADL's:  Intact  Cognition: WNL  Sleep:  Fair   Screenings: AIMS    Flowsheet Row Video Visit from 06/13/2021 in [facility] Video Visit from 05/16/2021 in [facility]  AIMS Total Score 0 0   GAD-7    Flowsheet Row Counselor from 06/19/2023 in [facility] Office Visit from 03/14/2023 in [facility] Office Visit from 10/31/2022 in [facility] Office Visit from 10/15/2022 in [facility] Office Visit from 09/13/2022 in [facility]  Total GAD-7 Score 12 3 4 7 12    PHQ2-9    Flowsheet Row Counselor from 06/19/2023 in [facility] Office Visit from 03/14/2023 in [facility] Office Visit from 10/31/2022 in [facility] Office Visit from 10/15/2022 in [facility] Office Visit from 09/13/2022 in [facility]  PHQ-2 Total Score 6 1 2 2 2   PHQ-9 Total Score 17 3 3 3 4    Flowsheet Row Video Visit from 07/22/2023 in [facility] Video Visit from 06/28/2023 in [facility] Counselor from 06/19/2023 in [facility]  C-SSRS RISK CATEGORY No Risk No Risk No Risk     Assessment and Plan: Ashley Fields is a 68 year old Caucasian female who has a history of PTSD, bipolar disorder, panic disorder, insomnia was evaluated by telemedicine today.  Discussed assessment and plan as noted below.  Insomnia-improving Currently sleep better on the trazodone .  Continues to have chronic pain could affect sleep. Continue Trazodone  200 mg at bedtime Continue Hydroxyzine  50 mg twice a day as needed  Panic disorder-improving Continues to have anxiety especially around her spouse.  Currently with relationship struggles.  Will benefit from continued psychotherapy sessions. Continue Venlafaxine  150 mg daily Continue Hydroxyzine  50 mg twice a day as needed Continue CBT with therapist.  PTSD-improving Currently anxiety is mostly related to situational stressors and denies any significant intrusive memories or flashbacks. Continue psychotherapy sessions Continue Venlafaxine  as prescribed Continue Trazodone  200 mg at bedtime  Bipolar disorder in remission most recent episode mixed Denies any significant manic, hypomanic or depression symptoms Continue Trileptal  750 mg daily Continue Gabapentin  600 mg 4 times a day Reviewed and discussed labs including sodium level dated 06/25/2023-135.  Follow-up Follow-up in clinic in 3 months or sooner in person.   Collaboration of Care: Collaboration of Care: Referral or follow-up with counselor/therapist [employer] patient to keep appointment with therapist.  Patient/Guardian was advised Release of Information must be obtained prior to any record release in order to collaborate their care with an outside provider. Patient/Guardian was advised if they have not already done so to contact the registration department to sign all necessary forms in order for us  to  release information regarding their care.   Consent: Patient/Guardian gives verbal consent for treatment and assignment of benefits for services provided during this visit. Patient/Guardian expressed understanding and agreed to proceed.   This note was generated in part or whole with voice recognition software. Voice recognition is usually quite accurate but there are transcription errors that can and very often  do occur. I apologize for any typographical errors that were not detected and corrected.    Ashley Perris, MD 07/24/2023, 10:56 AM

## 2023-07-22 NOTE — Patient Instructions (Signed)
 Medication Instructions:  Your physician recommends that you continue on your current medications as directed. Please refer to the Current Medication list given to you today.   *If you need a refill on your cardiac medications before your next appointment, please call your pharmacy*  Lab Work: Your provider would like for you to have following labs drawn today BMP, Lpa.   If you have labs (blood work) drawn today and your tests are completely normal, you will receive your results only by: MyChart Message (if you have MyChart) OR A paper copy in the mail If you have any lab test that is abnormal or we need to change your treatment, we will call you to review the results.  Testing/Procedures: No test ordered today   Follow-Up: At [facility], you and your health needs are our priority.  As part of our continuing mission to provide you with exceptional heart care, our providers are all part of one team.  This team includes your primary Cardiologist (physician) and Advanced Practice Providers or APPs (Physician Assistants and Nurse Practitioners) who all work together to provide you with the care you need, when you need it.  Your next appointment:   3 month(s)  Provider:   Timothy Gollan, MD or Tylene Lunch, NP

## 2023-07-22 NOTE — Progress Notes (Signed)
 Cardiology Office Note   Date:  07/22/2023  ID:  Jupiter Boys, DOB [birth year], MRN [id number] PCP: Edman Marsa PARAS, DO  [facility] Providers Cardiologist:  Evalene Lunger, MD Cardiology APP:  Gerard Frederick, NP     History of Present Illness Ashley Fields is a 68 y.o. female with a past medical history of coronary calcification on CT, aorta, cirrhosis, bipolar disorder,  prior tobacco abuse, hyperlipidemia, gastric bypass, Hollenhorst plaque (right eye), mild carotid artery disease, arthritis, depression, GERD, hepatitis C, who presents today for follow-up.   Prior history of coronary calcification on CT scan with coronary calcium  score 2456, 99 percentile.  Since medical control, negative stress testing in 2019 and August 2022.  Echocardiogram completed 02/2022 with an LVEF 60 to 60%, no RWMA, mild LVH, and no evidence of valvular heart disease.  ZIO monitor completed 03/09/2022 revealed occasional paroxysmal SVT, no significant sustained arrhythmia.   She was last seen in clinic 03/28/2023 states that she had been having labile blood pressures.  Blood pressure relatively low in the mornings she wakes up with very high in the afternoon.  She denies chest pain, shortness of breath but does have occasional peripheral edema.  She notes that the bouts of low blood pressure she had dizziness and lightheadedness but denies any syncope or near syncopal episodes.  She also had an upcoming surgical procedure.  She was scheduled for a cardiac PET stress for surgical clearance which was denied by her insurance and she was changed to a Lexiscan  Myoview .    She was last seen in clinic 06/24/2021 and overall she been doing well.  Unfortunately she continues to suffer from labile blood pressures.  She previously undergone Lexiscan  MPI which unfortunately revealed abnormal open probably low risk.  With abnormal results she was scheduled for a left heart catheterization.  Catheterization revealed mild to  moderate nonobstructive coronary artery disease with heavy mural calcification, normal left ventricular systolic function with an LVEF 55 to 65% with mildly elevated filling pressure LVEDP 20 mmHg.  Continue medical therapy and risk factor modification to prevent progression of coronary artery disease was recommended.  She returns to clinic today stating that overall she has been doing well from a cardiac perspective.  She denies any chest pain, shortness of breath, palpitations or peripheral edema.  States that she did not have any complications from her recent heart catheterization.  She has questions today related on how to prevent progression.  She also would like to see exactly on the heart diagram where her blockages are.  States that she has been compliant with her current medication regimen without any undue side effects.  Denies any hospitalizations or visits to the emergency department.  ROS: 10 point review of system is reviewed and considered negative exception was been listed in the HPI  Studies Reviewed EKG Interpretation Date/Time:  Monday July 22 2023 09:01:44 EDT Ventricular Rate:  66 PR Interval:  186 QRS Duration:  90 QT Interval:  398 QTC Calculation: 417 R Axis:   51  Text Interpretation: Normal sinus rhythm Normal ECG When compared with ECG of 25-Jun-2023 09:02, No significant change was found Confirmed by Gerard Frederick ([id number]) on 07/22/2023 9:03:10 AM    LHC 07/02/2023 Conclusions: Mild to moderate, non-obstructive coronary artery disease with heavy mural calcification, as detailed below. Normal left ventricular systolic function (LVEF 55-65%) with mildly elevated filling pressure (LVEDP 20 mmHg).   Recommendations: Continue medical therapy and risk factor modification to prevent progression of coronary artery disease.  Lexiscan  MPI 06/21/2023   Abnormal, probably low risk pharmacologic myocardial perfusion stress test.   There is a moderate in size, mild in severity,  partially reversible defect.  The apical septal and apical defects are fixed and most likely represent artifact.  The apical anterior defect is reversible and suspicious for ischemia.   Left ventricular systolic function is normal (LVEF > 65%).   Coronary artery calcification is noted on the attenuation correction CT.   Sensitivity and specificity of the study are degraded by attenuation from breast implants and extracardiac activity.   Compared to the prior study from 08/08/2020, the apical anterior reversible defect appears new   Event Monitor (Zio) 03/09/2022 Conclusion Occasional paroxysmal SVT. No significant sustained arrhythmias. Paroxysmal SVT coincided with patient triggered events.   2D echo 02/28/2022 1. Left ventricular ejection fraction, by estimation, is 60 to 65%. The  left ventricle has normal function. The left ventricle has no regional  wall motion abnormalities. There is mild left ventricular hypertrophy.  Left ventricular diastolic function  could not be evaluated.   2. Right ventricular systolic function is normal. The right ventricular  size is normal.   3. The mitral valve is normal in structure. No evidence of mitral valve  regurgitation.   4. The aortic valve was not well visualized. Aortic valve regurgitation  is not visualized.   5. The inferior vena cava is normal in size with greater than 50%  respiratory variability, suggesting right atrial pressure of 3 mmHg.    Myoview  Lexicsan 08/2020 Narrative & Impression  There was no ST segment deviation noted during stress. No T wave inversion was noted during stress. Defect 1: There is a small defect of mild severity present in the apex location likely artifact. The study is normal. This is a low risk study. The left ventricular ejection fraction is normal (55-65%). CT attenuation images shows moderate aortic and coronary calcifications.     Myoview  lexiscan  2019 Narrative & Impression  Pharmacological  myocardial perfusion imaging study with no significant  ischemia Normal wall motion, EF estimated at 83% No EKG changes concerning for ischemia at peak stress or in recovery. Low risk scan    Cardiac CT scoring 2019 IMPRESSION: Coronary calcium  score of 2456 . This was 55 th percentile for age and sex matched control.   Consider f/u heart catheterization or perfusion study given how extensive the coronary calcification is Risk Assessment/Calculations           Physical Exam VS:  BP 100/60 (BP Location: Left Arm, Patient Position: Sitting, Cuff Size: Normal)   Pulse 66   Ht 5' 2 (1.575 m)   Wt 148 lb (67.1 kg)   SpO2 99%   BMI 27.07 kg/m        Wt Readings from Last 3 Encounters:  07/22/23 148 lb (67.1 kg)  07/02/23 147 lb 9.6 oz (67 kg)  06/25/23 151 lb 9.6 oz (68.8 kg)    GEN: Well nourished, well developed in no acute distress NECK: No JVD; No carotid bruits CARDIAC: RRR, II/VI systolic murmur RUSB, without rubs or gallops RESPIRATORY:  Clear to auscultation without rales, wheezing or rhonchi  ABDOMEN: Soft, non-tender, non-distended EXTREMITIES: Trace pretibial edema; No deformity   ASSESSMENT AND PLAN Nonobstructive coronary artery disease with recent left heart catheterization revealing mild to moderate nonobstructive coronary artery disease with heavy mural calcification.  Normal left ventricular systolic function with an LVEF 55 to 60% with mildly elevated filling pressure with an LVEDP of 20 mmHg.  Left anterior descending had mild diffuse disease throughout the vessel and was severely calcified and, left circumflex had mild diffuse disease and was severely calcified, the RCA showed proximal RCA lesion 55% stenosis, proximal RCA lesion 40% stenosed, mid RCA lesion is 60% stenosed recommendation was to continue medical therapy and risk factor modification to prevent progression of coronary artery disease.  She is continued on aspirin  81 mg daily, ezetimibe  10 mg daily,  and simvastatin 40 mg daily.  She is being sent for an updated BMP post her procedure today and Lp(a).  EKG also revealed sinus rhythm with rate of 66 with no acute ischemic changes noted.  Mixed hyperlipidemia with an LDL of 46.  Remains at goal of less than 55.  She has been continued on rosuvastatin  and ezetimibe .  She is also being sent for an updated LP(a) today.  Labile blood pressures with blood pressure today at home of 100/60 which has remained stable.  She is continued on as needed furosemide  and but no regularly scheduled antihypertensive medications.  She has been encouraged to continue to monitor pressures at home as well.  Heart murmur noted on exam with echocardiogram completed 02/2022 revealed LVEF of 60 to 65%, no RWMA, mild LVH, right ventricular function and size were normal, there were no valvular abnormalities.  Will continue to monitor with surveillance studies.  Hollenhorst plaque to the right with carotid duplex revealing minimal bilateral disease.  Cholesterol remains at goal.  She is continued on aspirin  and statin therapy.       Dispo: Patient to return to clinic to see MD/APP in 3 months or sooner if needed for further evaluation  Signed, Reveca Desmarais, NP

## 2023-07-23 ENCOUNTER — Telehealth: Payer: Self-pay | Admitting: Cardiology

## 2023-07-23 ENCOUNTER — Ambulatory Visit: Payer: Self-pay | Admitting: Cardiology

## 2023-07-23 ENCOUNTER — Telehealth: Payer: Self-pay | Admitting: Pharmacy Technician

## 2023-07-23 DIAGNOSIS — Z79899 Other long term (current) drug therapy: Secondary | ICD-10-CM

## 2023-07-23 LAB — BASIC METABOLIC PANEL WITH GFR
BUN/Creatinine Ratio: 17 (ref 12–28)
BUN: 13 mg/dL (ref 8–27)
CO2: 22 mmol/L (ref 20–29)
Calcium: 8.6 mg/dL — ABNORMAL LOW (ref 8.7–10.3)
Chloride: 102 mmol/L (ref 96–106)
Creatinine, Ser: 0.77 mg/dL (ref 0.57–1.00)
Glucose: 64 mg/dL — ABNORMAL LOW (ref 70–99)
Potassium: 4.3 mmol/L (ref 3.5–5.2)
Sodium: 140 mmol/L (ref 134–144)
eGFR: 84 mL/min/1.73 (ref >59)

## 2023-07-23 LAB — LIPOPROTEIN A (LPA): Lipoprotein (a): 165 nmol/L — ABNORMAL HIGH (ref <75.0)

## 2023-07-23 MED ORDER — REPATHA SURECLICK 140 MG/ML ~~LOC~~ SOAJ: 140.0000 mg | SUBCUTANEOUS | 3 refills | Status: AC

## 2023-07-23 NOTE — Telephone Encounter (Signed)
 Called and spoke with the patient.  Patient stated that she had not realized that I had responded to her MyChart message when she called, but had read the message by the time I returned her call.  Patient expressed gratitude for sending in the prescription.  Patient verbalized understanding with all questions and concerns addressed at this time.

## 2023-07-23 NOTE — Telephone Encounter (Signed)
 Pt stated she was returning nurse call and is now requesting a callback regarding labs after discussion in MyChart. Please advise

## 2023-07-23 NOTE — Telephone Encounter (Signed)
 Pharmacy Patient Advocate Encounter  Received notification from [facility] that Prior Authorization for repatha  has been APPROVED from 07/23/23 to 01/08/24   PA #/Case ID/Reference #: [id number]

## 2023-07-23 NOTE — Progress Notes (Signed)
 Recommend starting Repatha  140mg  every 2 weeks auto injection and repeat lipid and hepatic panel in 3 months.

## 2023-07-23 NOTE — Telephone Encounter (Signed)
 Pharmacy Patient Advocate Encounter   Received notification from Onbase that prior authorization for repatha  is required/requested.   Insurance verification completed.   The patient is insured through [facility] .   Per test claim: PA required; PA submitted to above mentioned insurance via latent Key/confirmation #/EOC [id number] Status is pending

## 2023-07-24 ENCOUNTER — Other Ambulatory Visit: Payer: Self-pay | Admitting: Psychiatry

## 2023-07-24 DIAGNOSIS — F410 Panic disorder [episodic paroxysmal anxiety] without agoraphobia: Secondary | ICD-10-CM

## 2023-07-24 DIAGNOSIS — G4701 Insomnia due to medical condition: Secondary | ICD-10-CM

## 2023-07-25 ENCOUNTER — Ambulatory Visit: Payer: Medicare HMO

## 2023-07-25 DIAGNOSIS — I2510 Atherosclerotic heart disease of native coronary artery without angina pectoris: Secondary | ICD-10-CM

## 2023-07-26 ENCOUNTER — Telehealth: Payer: Self-pay

## 2023-07-26 DIAGNOSIS — F410 Panic disorder [episodic paroxysmal anxiety] without agoraphobia: Secondary | ICD-10-CM

## 2023-07-26 NOTE — Telephone Encounter (Signed)
 Called patient for more information regarding CP and N. Patient stated that it slowly relieved after taking anxiety medication. She is not having CP, N or HA this morning; only feeling very tired.  Advised patient to go to ED if any of the S&S she reported last night come back today. Patient verbalized understanding and agreement with plan.

## 2023-07-29 ENCOUNTER — Ambulatory Visit (INDEPENDENT_AMBULATORY_CARE_PROVIDER_SITE_OTHER): Admitting: Professional Counselor

## 2023-07-29 DIAGNOSIS — F4310 Post-traumatic stress disorder, unspecified: Secondary | ICD-10-CM | POA: Diagnosis not present

## 2023-07-29 DIAGNOSIS — F3178 Bipolar disorder, in full remission, most recent episode mixed: Secondary | ICD-10-CM

## 2023-07-29 DIAGNOSIS — F410 Panic disorder [episodic paroxysmal anxiety] without agoraphobia: Secondary | ICD-10-CM | POA: Diagnosis not present

## 2023-07-29 MED ORDER — HYDROXYZINE HCL 25 MG PO TABS: 25.0000 mg | ORAL_TABLET | Freq: Three times a day (TID) | ORAL | 0 refills | Status: DC | PRN

## 2023-07-29 NOTE — Progress Notes (Signed)
  THERAPIST PROGRESS NOTE  Session Time: 9:03 AM - 9:53 AM   Participation Level: Active  Behavioral Response: Well Groomed, Alert, Dysphoric  Type of Therapy: Individual Therapy  Treatment Goals addressed: Active BH CCP BIPOLAR DISORDER  LTG: When I don't have to take all this medication. I just want to feel better, not feel so depressed, so anxious. I want to be able to have a normal day. Just not feeling anxious, depressed, other people have normal days, they look happy, laughing.     Start:  07/29/23    Expected End:  07/27/24     STG: My daughter-in-law and I might get along one day. To improve relationships AEB self-report of improvement by using interpersonal effectiveness skills over the next 90 days.    STG: To not have this level of anxiousness I feel to not have panic attacks so often. To reduce sxs of anxiety AEB reduction of GAD7 scores by using coping mechanisms over the next 90 days.    STG: If I let that anger go and I get depressed and I can get so mad, I'll lose control. I just want to destroy everything. To improve emotion regulation AEB self-report reduction in outbursts by using coping mechanisms over the next 90 days.   ProgressTowards Goals: Initial  Interventions: Motivational Interviewing and Supportive  Summary: Aleece Loyd is a 68 y.o. female who presents with a history of panic disorder, bipolar disorder, and PTSD. She appeared alert and oriented x5. Latoyia provided updates since her initial CCA. She noted some health issues and side effects from medications. She reported depressive symptoms and continues to miss home ([place] ). She plans to go visit in September. [place] noted some frustrations with her husband and neighbor. She engaged in developing her treatment plan.   Therapist Response: Conducted session with [employer]. Began session with check-in/update since previous session. Utilized empathetic and reflective listening. Used open-ended questions to  facilitate discussion and summarized thoughts/feelings. Developed treatment plan with input from [place] on current strengths, needs, and progress towards goals. Scheduled additional appointment and concluded session.   Suicidal/Homicidal: No  Plan: Return again in 2 weeks.  Diagnosis: Bipolar disorder, in full remission, most recent episode mixed (HCC)  Panic disorder  PTSD (post-traumatic stress disorder)  Collaboration of Care: Medication Management AEB chart review  Patient/Guardian was advised Release of Information must be obtained prior to any record release in order to collaborate their care with an outside provider. Patient/Guardian was advised if they have not already done so to contact the registration department to sign all necessary forms in order for us  to release information regarding their care.   Consent: Patient/Guardian gives verbal consent for treatment and assignment of benefits for services provided during this visit. Patient/Guardian expressed understanding and agreed to proceed.   Almarie JONETTA Ligas, [facility] 07/29/2023

## 2023-07-29 NOTE — Telephone Encounter (Signed)
 I have sent hydroxyzine  25 mg 3 times a day as needed to pharmacy.

## 2023-08-01 ENCOUNTER — Other Ambulatory Visit: Payer: Self-pay | Admitting: Family Medicine

## 2023-08-01 DIAGNOSIS — Z90710 Acquired absence of both cervix and uterus: Secondary | ICD-10-CM

## 2023-08-02 NOTE — Telephone Encounter (Signed)
 Requested Prescriptions  Pending Prescriptions Disp Refills   estradiol  (ESTRACE ) 1 MG tablet [Pharmacy Med Name: ESTRADIOL  1 MG TABLET] 90 tablet 1    Sig: TAKE 1 TABLET BY MOUTH EVERY DAY     OB/GYN:  Estrogens Passed - 08/02/2023 11:16 AM      Passed - Mammogram is up-to-date per Health Maintenance      Passed - Last BP in normal range    BP Readings from Last 1 Encounters:  07/22/23 100/60         Passed - Valid encounter within last 12 months    Recent Outpatient Visits           3 months ago Annual physical exam   [place] [facility] [place], Marsa PARAS, DO   4 months ago Hypotension, unspecified hypotension type   [facility] [place], Marsa PARAS, DO   5 months ago Acute non-recurrent frontal sinusitis   [facility] [place], Marsa PARAS, DO       Future Appointments             In 3 weeks Gerard Frederick, NP [facility] [place]   In 2 months Gerard Frederick, NP [facility] [place]   In 3 months Edman, Marsa PARAS, DO [facility], [facility]

## 2023-08-05 DIAGNOSIS — H04123 Dry eye syndrome of bilateral lacrimal glands: Secondary | ICD-10-CM | POA: Diagnosis not present

## 2023-08-05 DIAGNOSIS — H16223 Keratoconjunctivitis sicca, not specified as Sjogren's, bilateral: Secondary | ICD-10-CM | POA: Diagnosis not present

## 2023-08-05 DIAGNOSIS — H16143 Punctate keratitis, bilateral: Secondary | ICD-10-CM | POA: Diagnosis not present

## 2023-08-05 MED ORDER — METOPROLOL SUCCINATE ER 25 MG PO TB24: 12.5000 mg | ORAL_TABLET | Freq: Every day | ORAL | 3 refills | Status: DC

## 2023-08-05 NOTE — Telephone Encounter (Signed)
 If she continues to have episodes of chest discomfort with her nonobstructive coronary artery disease we can start low dose Toprol  XL 12.5 mg daily for antianginal effect. Would recommend she continue to monitor her blood pressure and hear rate 1-2 hours after taking the medication. I would avoid Imdur with her history of headaches.

## 2023-08-09 ENCOUNTER — Other Ambulatory Visit: Payer: Self-pay | Admitting: Family Medicine

## 2023-08-09 DIAGNOSIS — J329 Chronic sinusitis, unspecified: Secondary | ICD-10-CM

## 2023-08-09 DIAGNOSIS — H6993 Unspecified Eustachian tube disorder, bilateral: Secondary | ICD-10-CM

## 2023-08-09 NOTE — Telephone Encounter (Signed)
 Too soon for refill, LRF 07/18/23.  Requested Prescriptions  Pending Prescriptions Disp Refills   Azelastine  HCl 137 MCG/SPRAY SOLN [Pharmacy Med Name: AZELASTINE  0.1% (137 MCG) SPRY]  1    Sig: PLACE 2 SPRAYS INTO BOTH NOSTRILS 2 (TWO) TIMES DAILY. USE IN EACH NOSTRIL AS DIRECTED     Ear, Nose, and Throat: Nasal Preparations - Antiallergy Passed - 08/09/2023  3:27 PM      Passed - Valid encounter within last 12 months    Recent Outpatient Visits           3 months ago Annual physical exam   [place] [facility] [place], Marsa PARAS, DO   4 months ago Hypotension, unspecified hypotension type   [facility] [place], Marsa PARAS, DO   5 months ago Acute non-recurrent frontal sinusitis   [facility], Marsa PARAS, DO       Future Appointments             In 2 weeks Gerard Frederick, NP [facility] [place]   In 2 months Gerard Frederick, NP [facility] [place]   In 2 months Edman, Marsa PARAS, DO [place] [facility], [facility]

## 2023-08-13 ENCOUNTER — Ambulatory Visit (INDEPENDENT_AMBULATORY_CARE_PROVIDER_SITE_OTHER): Admitting: Professional Counselor

## 2023-08-13 DIAGNOSIS — F3178 Bipolar disorder, in full remission, most recent episode mixed: Secondary | ICD-10-CM

## 2023-08-13 DIAGNOSIS — F4310 Post-traumatic stress disorder, unspecified: Secondary | ICD-10-CM

## 2023-08-13 NOTE — Progress Notes (Signed)
  THERAPIST PROGRESS NOTE  Session Time: 9:05 AM - 9:55 AM  Participation Level: Active  Behavioral Response: Well Groomed, Alert, Dysphoric  Type of Therapy: Individual Therapy  Treatment Goals addressed: Active BH CCP BIPOLAR DISORDER  LTG: When I don't have to take all this medication. I just want to feel better, not feel so depressed, so anxious. I want to be able to have a normal day. Just not feeling anxious, depressed, other people have normal days, they look happy, laughing.                 Start:  07/29/23    Expected End:  07/27/24      STG: My daughter-in-law and I might get along one day. To improve relationships AEB self-report of improvement by using interpersonal effectiveness skills over the next 90 days.     STG: To not have this level of anxiousness I feel to not have panic attacks so often. To reduce sxs of anxiety AEB reduction of GAD7 scores by using coping mechanisms over the next 90 days.     STG: If I let that anger go and I get depressed and I can get so mad, I'll lose control. I just want to destroy everything. To improve emotion regulation AEB self-report reduction in outbursts by using coping mechanisms over the next 90 days.   ProgressTowards Goals: Progressing  Interventions: CBT, Motivational Interviewing, Assertiveness Training, and Supportive  Summary: Ashley Fields is a 68 y.o. female who presents with a history of panic disorder, bipolar disorder, and PTSD. She appeared alert and oriented x5. She reported she has been restless and overwhelmed. She shared contributing factors. Ashley Fields reported communication skills may be helpful with these stressors. She was receptive to [employer] and identified a way she will use this with her husband. She noted she has given herself two years to find a place in [place]  if things don't change at home.   Therapist Response: Conducted session with Ashley Fields. Began session with check-in/update since previous session. Utilized  empathetic and reflective listening. Used open-ended questions to facilitate discussion and summarized thoughts/feelings. Explained and modeled DEAR MAN skill. Provided feedback on Ashley Fields's practice of skill. Scheduled additional appointment and concluded session.   Suicidal/Homicidal: No  Plan: Return again in 2 weeks.  Diagnosis: Bipolar disorder, in full remission, most recent episode mixed (HCC)  PTSD (post-traumatic stress disorder)  Collaboration of Care: Medication Management AEB chart review  Patient/Guardian was advised Release of Information must be obtained prior to any record release in order to collaborate their care with an outside provider. Patient/Guardian was advised if they have not already done so to contact the registration department to sign all necessary forms in order for us  to release information regarding their care.   Consent: Patient/Guardian gives verbal consent for treatment and assignment of benefits for services provided during this visit. Patient/Guardian expressed understanding and agreed to proceed.   Ashley Fields, [facility] 08/13/2023

## 2023-08-14 ENCOUNTER — Other Ambulatory Visit: Payer: Self-pay | Admitting: Family Medicine

## 2023-08-14 DIAGNOSIS — E538 Deficiency of other specified B group vitamins: Secondary | ICD-10-CM

## 2023-08-15 NOTE — Telephone Encounter (Signed)
 Requested Prescriptions  Pending Prescriptions Disp Refills   cyanocobalamin  (VITAMIN B12) 1000 MCG/ML injection [Pharmacy Med Name: CYANOCOBALAMIN  1,000 MCG/ML VL] 3 mL 0    Sig: INJECT 1 ML (1,000 MCG TOTAL) INTO THE MUSCLE EVERY 30 DAYS.     Endocrinology:  Vitamins - Vitamin B12 Failed - 08/15/2023  2:58 PM      Failed - B12 Level in normal range and within 360 days    Vitamin B-12  Date Value Ref Range Status  04/24/2023 1,146 (H) 200 - 1,100 pg/mL Final         Passed - HCT in normal range and within 360 days    Hematocrit  Date Value Ref Range Status  06/25/2023 39.5 34.0 - 46.6 % Final         Passed - HGB in normal range and within 360 days    Hemoglobin  Date Value Ref Range Status  06/25/2023 13.0 11.1 - 15.9 g/dL Final         Passed - Valid encounter within last 12 months    Recent Outpatient Visits           3 months ago Annual physical exam   [facility] [place], Marsa PARAS, DO   5 months ago Hypotension, unspecified hypotension type   [facility] [place], Marsa PARAS, DO   6 months ago Acute non-recurrent frontal sinusitis   [facility] [place], Marsa PARAS, DO       Future Appointments             In 1 week Gerard Frederick, NP [facility] [place]   In 2 months Gerard Frederick, NP [facility] [place]   In 2 months Edman, Marsa PARAS, DO [facility], [facility]

## 2023-08-22 ENCOUNTER — Other Ambulatory Visit: Payer: Self-pay | Admitting: Psychiatry

## 2023-08-22 DIAGNOSIS — F410 Panic disorder [episodic paroxysmal anxiety] without agoraphobia: Secondary | ICD-10-CM

## 2023-08-25 ENCOUNTER — Emergency Department

## 2023-08-25 ENCOUNTER — Other Ambulatory Visit: Payer: Self-pay

## 2023-08-25 ENCOUNTER — Encounter: Payer: Self-pay | Admitting: Family Medicine

## 2023-08-25 ENCOUNTER — Emergency Department
Admission: EM | Admit: 2023-08-25 | Discharge: 2023-08-25 | Disposition: A | Discharge status: Home / Self Care | Attending: Emergency Medicine | Admitting: Emergency Medicine

## 2023-08-25 DIAGNOSIS — S199XXA Unspecified injury of neck, initial encounter: Secondary | ICD-10-CM | POA: Diagnosis not present

## 2023-08-25 DIAGNOSIS — N179 Acute kidney failure, unspecified: Secondary | ICD-10-CM | POA: Diagnosis not present

## 2023-08-25 DIAGNOSIS — R55 Syncope and collapse: Secondary | ICD-10-CM | POA: Diagnosis not present

## 2023-08-25 DIAGNOSIS — E860 Dehydration: Secondary | ICD-10-CM | POA: Insufficient documentation

## 2023-08-25 DIAGNOSIS — I700 Atherosclerosis of aorta: Secondary | ICD-10-CM | POA: Diagnosis not present

## 2023-08-25 DIAGNOSIS — S0990XA Unspecified injury of head, initial encounter: Secondary | ICD-10-CM | POA: Diagnosis not present

## 2023-08-25 DIAGNOSIS — M545 Low back pain, unspecified: Secondary | ICD-10-CM | POA: Insufficient documentation

## 2023-08-25 DIAGNOSIS — R9389 Abnormal findings on diagnostic imaging of other specified body structures: Secondary | ICD-10-CM | POA: Diagnosis not present

## 2023-08-25 DIAGNOSIS — Z981 Arthrodesis status: Secondary | ICD-10-CM | POA: Diagnosis not present

## 2023-08-25 HISTORY — DX: Hypoglycemia, unspecified: E16.2

## 2023-08-25 LAB — CBC
HCT: 37.4 % (ref 36.0–46.0)
Hemoglobin: 12.4 g/dL (ref 12.0–15.0)
MCH: 30.6 pg (ref 26.0–34.0)
MCHC: 33.2 g/dL (ref 30.0–36.0)
MCV: 92.3 fL (ref 80.0–100.0)
Platelets: 151 K/uL (ref 150–400)
RBC: 4.05 MIL/uL (ref 3.87–5.11)
RDW: 12.8 % (ref 11.5–15.5)
WBC: 4.9 K/uL (ref 4.0–10.5)
nRBC: 0 % (ref 0.0–0.2)

## 2023-08-25 LAB — URINALYSIS, ROUTINE W REFLEX MICROSCOPIC
Glucose, UA: NEGATIVE mg/dL
Hgb urine dipstick: NEGATIVE
Ketones, ur: 5 mg/dL — AB
Leukocytes,Ua: NEGATIVE
Nitrite: NEGATIVE
Protein, ur: NEGATIVE mg/dL
Specific Gravity, Urine: 1.027 (ref 1.005–1.030)
pH: 5 (ref 5.0–8.0)

## 2023-08-25 LAB — COMPREHENSIVE METABOLIC PANEL WITH GFR
ALT: 30 U/L (ref 0–44)
AST: 38 U/L (ref 15–41)
Albumin: 3.7 g/dL (ref 3.5–5.0)
Alkaline Phosphatase: 41 U/L (ref 38–126)
Anion gap: 8 (ref 5–15)
BUN: 23 mg/dL (ref 8–23)
CO2: 28 mmol/L (ref 22–32)
Calcium: 8.8 mg/dL — ABNORMAL LOW (ref 8.9–10.3)
Chloride: 101 mmol/L (ref 98–111)
Creatinine, Ser: 1.15 mg/dL — ABNORMAL HIGH (ref 0.44–1.00)
GFR, Estimated: 52 mL/min — ABNORMAL LOW (ref >60)
Glucose, Bld: 99 mg/dL (ref 70–99)
Potassium: 4.6 mmol/L (ref 3.5–5.1)
Sodium: 137 mmol/L (ref 135–145)
Total Bilirubin: 0.8 mg/dL (ref 0.0–1.2)
Total Protein: 6.6 g/dL (ref 6.5–8.1)

## 2023-08-25 LAB — TROPONIN I (HIGH SENSITIVITY)
Troponin I (High Sensitivity): 3 ng/L (ref <18)
Troponin I (High Sensitivity): <2 ng/L (ref <18)

## 2023-08-25 LAB — LACTIC ACID, PLASMA: Lactic Acid, Venous: 0.6 mmol/L (ref 0.5–1.9)

## 2023-08-25 MED ORDER — LACTATED RINGERS IV BOLUS
1000.0000 mL | Freq: Once | INTRAVENOUS | Status: AC
  Administered 2023-08-25: 1000 mL via INTRAVENOUS

## 2023-08-25 MED ORDER — SODIUM CHLORIDE 0.9 % IV BOLUS
1000.0000 mL | Freq: Once | INTRAVENOUS | Status: AC
  Administered 2023-08-25: 1000 mL via INTRAVENOUS

## 2023-08-25 NOTE — ED Triage Notes (Signed)
 Pt to ED with husband for hypotension, pt has been taking BP all day because felt dizzy and weak and was 60s/40s every time. Pt had syncopal episode today, hit head when she fell. No thinners. No other injuries but has low back pain and HA. Pt ambulated to triage room. Pt is diuzzy. BP is 68/51 in triage. Starting 1L bolus. Placed in wheelchair. Respirations are unlabored, skin dry.  Recently started on Rapatha IM med.  Tongue is dry. Had cardioversion 1 month ago.

## 2023-08-25 NOTE — Discharge Instructions (Addendum)
 Make sure to keep yourself hydrated.  Please make sure to follow-up with primary care doctor this week to get reassessed and to recheck your blood pressures as well as to get repeat renal function labs

## 2023-08-25 NOTE — ED Provider Notes (Signed)
 [facility] Provider Note    Event Date/Time   First MD Initiated Contact with Patient 08/25/23 1524     (approximate)   History   Hypotension and Loss of Consciousness   HPI  Ashley Fields is a 68 y.o. female with history of bipolar disorder, fibromyalgia, depression, hyperlipidemia, chronic pain, presenting with syncopal episode and lightheadedness.  Patient states that she was taking her blood pressure today, noted it was low, felt dizzy and lightheaded, passed out.  States that her blood pressures were typically in the systolic 120s, was found to be systolic 60s.  States that she just started on a new intramuscular medication called Repatha .  Did say that she feels a little bit dehydrated.  She denies any chest pain or shortness of breath, does note some chest tightness right now.  Denies any abdominal pain, no nausea vomiting diarrhea or urinary symptoms, no new weakness or numbness.  Does note some headache after the fall.  And also mild lower back pain.  No incontinence.  No saddle anesthesia.  States that she takes the aspirin  daily, is not on any other blood thinning medication.  Is supposed to be on metoprolol  but has not taken it due to reading about blood pressure side effects.  No recent travel or surgeries, no history of DVTs, not on any hormones.  Denies history of malignancy.  Per independent history from EMS, they found her to be hypotensive to the systolic 60s, started her on 1 L bolus.     Physical Exam   Triage Vital Signs: ED Triage Vitals  Encounter Vitals Group     BP 08/25/23 1509 (!) 68/51     Girls Systolic BP Percentile --      Girls Diastolic BP Percentile --      Boys Systolic BP Percentile --      Boys Diastolic BP Percentile --      Pulse Rate 08/25/23 1509 65     Resp 08/25/23 1508 20     Temp 08/25/23 1509 (!) 97.5 F (36.4 C)     Temp Source 08/25/23 1509 Oral     SpO2 08/25/23 1509 98 %     Weight 08/25/23 1512 144 lb  (65.3 kg)     Height 08/25/23 1512 5' 2 (1.575 m)     Head Circumference --      Peak Flow --      Pain Score 08/25/23 1511 7     Pain Loc --      Pain Education --      Exclude from Growth Chart --     Most recent vital signs: Vitals:   08/25/23 1710 08/25/23 1720  BP: 139/83 (!) 141/67  Pulse: 63 76  Resp: 18 18  Temp:    SpO2: 100% 99%     General: Awake, no distress.  CV:  Good peripheral perfusion.  Resp:  Normal effort.  No tachypnea or respiratory distress Abd:  No distention.  Soft nontender Other:  Dry mucous membranes, no palpable skull deformities or tenderness, no midline spinal tenderness, no focal weakness or numbness, no facial droop or slurred speech, pupils are equal.  No unilateral calf sign or tenderness. patient was using a blood pressure cuff that was too large, after the nurse switch her over, repeat blood pressure were in the 90s.   ED Results / Procedures / Treatments   Labs (all labs ordered are listed, but only abnormal results are displayed) Labs Reviewed  COMPREHENSIVE METABOLIC PANEL WITH GFR - Abnormal; Notable for the following components:      Result Value   Creatinine, Ser 1.15 (*)    Calcium  8.8 (*)    GFR, Estimated 52 (*)    All other components within normal limits  URINALYSIS, ROUTINE W REFLEX MICROSCOPIC - Abnormal; Notable for the following components:   Color, Urine YELLOW (*)    APPearance HAZY (*)    Bilirubin Urine MODERATE (*)    Ketones, ur 5 (*)    All other components within normal limits  CBC  LACTIC ACID, PLASMA  CBG MONITORING, ED  TROPONIN I (HIGH SENSITIVITY)  TROPONIN I (HIGH SENSITIVITY)     EKG  EKG shows, sinus [place] 70, normal QS, normal QTc, no obvious ischemic ST elevation, T wave inversion to aVL, T wave changes new compared to prior   RADIOLOGY On my independent interpretation, chest x-ray without obvious consolidation   PROCEDURES:  Critical Care performed:  No  Procedures   MEDICATIONS ORDERED IN ED: Medications  sodium chloride  0.9 % bolus 1,000 mL (0 mLs Intravenous Stopped 08/25/23 1607)  lactated ringers  bolus 1,000 mL (1,000 mLs Intravenous New Bag/Given 08/25/23 1607)     IMPRESSION / MDM / ASSESSMENT AND PLAN / ED COURSE  I reviewed the triage vital signs and the nursing notes.                              Differential diagnosis includes, but is not limited to, dehydration, medication side effect, electrolyte derangements, occult infection, arrhythmia, atypical ACS.  Considered PE but patient has no shortness of breath, hypoxia, tachycardia, unilateral capsular tenderness, no other risk factors for it.  For the fall, will make sure that there is no intracranial hemorrhage or fracture, will get a CT head, cervical spine, will get labs, EKG, troponin, chest x-ray, lactic acid, will give her some IV fluids, reassess.  Patient's presentation is most consistent with acute presentation with potential threat to life or bodily function.  Independent interpretation of labs and imaging below.  On reassessment patient is feeling significantly better after the fluids.  Blood pressure has normalized.  Was able to ambulate with steady gait without assistance.  Discussed with her about imaging and lab results including incidental findings, instructed her to follow-up with her primary care doctor this week to get repeat labs as well as get her blood pressure checked again.  Encouraged hydration, considered but no indication for inpatient admission at this time, she safe for outpatient management.  Will discharge with strict precautions.  Shared decision making with patient and family and they are agreeable with this plan.  The patient is on the cardiac monitor to evaluate for evidence of arrhythmia and/or significant heart rate changes.   Clinical Course as of 08/25/23 1741  Sun Aug 25, 2023  1718 DG Chest 1 View No acute chest findings. Chronic  elevation of right hemidiaphragm.  [TT]  1718 CT HEAD WO CONTRAST IMPRESSION: 1. No evidence of acute intracranial abnormality. 2. No acute cervical spine fracture. 3. C4-C7 ACDF.   [TT]  1719 CT Cervical Spine Wo Contrast IMPRESSION: 1. No evidence of acute intracranial abnormality. 2. No acute cervical spine fracture. 3. C4-C7 ACDF.   [TT]  1719 Independent review of labs, troponins not elevated, electrolytes not severely deranged, she has a mild AKI, UA is not consistent with UTI, no leukocytosis, lactate is normal. [TT]  1730 Troponin x 2  is negative. [TT]    Clinical Course User Index [TT] Waymond, Lorelle Cummins, MD     FINAL CLINICAL IMPRESSION(S) / ED DIAGNOSES   Final diagnoses:  Dehydration  Syncope, unspecified syncope type  AKI (acute kidney injury) (HCC)     Rx / DC Orders   ED Discharge Orders     None        Note:  This document was prepared using Dragon voice recognition software and may include unintentional dictation errors.    Waymond Lorelle Cummins, MD 08/25/23 [phone]

## 2023-08-25 NOTE — ED Notes (Signed)
 Pt ambulated to the in-room toilet and back with assistance

## 2023-08-26 NOTE — Telephone Encounter (Signed)
 Moving the appointment to another day or time is reasonable. She will need an EKG on return and would benefit from a Zio XT monitor for 14 days. That can be discussed at the appointment. I think there are opening on Thursday of this week.

## 2023-08-26 NOTE — Telephone Encounter (Signed)
 Patient was scheduled for tomorrow and it was changed to Thursday. Patient wants to keep her appt for tomorrow. Appointment date corrected and had her repeat appointment information back. No further needs

## 2023-08-27 ENCOUNTER — Ambulatory Visit: Admitting: Cardiology

## 2023-08-27 ENCOUNTER — Ambulatory Visit: Admitting: Family Medicine

## 2023-08-27 ENCOUNTER — Ambulatory Visit: Attending: Cardiology | Admitting: Cardiology

## 2023-08-27 ENCOUNTER — Encounter: Payer: Self-pay | Admitting: Cardiology

## 2023-08-27 ENCOUNTER — Ambulatory Visit (INDEPENDENT_AMBULATORY_CARE_PROVIDER_SITE_OTHER)

## 2023-08-27 ENCOUNTER — Ambulatory Visit (INDEPENDENT_AMBULATORY_CARE_PROVIDER_SITE_OTHER): Admitting: Professional Counselor

## 2023-08-27 VITALS — BP 126/70 | HR 80 | Ht 62.0 in | Wt 147.2 lb

## 2023-08-27 DIAGNOSIS — H34211 Partial retinal artery occlusion, right eye: Secondary | ICD-10-CM | POA: Diagnosis not present

## 2023-08-27 DIAGNOSIS — F3178 Bipolar disorder, in full remission, most recent episode mixed: Secondary | ICD-10-CM | POA: Diagnosis not present

## 2023-08-27 DIAGNOSIS — I2510 Atherosclerotic heart disease of native coronary artery without angina pectoris: Secondary | ICD-10-CM | POA: Diagnosis not present

## 2023-08-27 DIAGNOSIS — E782 Mixed hyperlipidemia: Secondary | ICD-10-CM

## 2023-08-27 DIAGNOSIS — R55 Syncope and collapse: Secondary | ICD-10-CM | POA: Diagnosis not present

## 2023-08-27 DIAGNOSIS — R0989 Other specified symptoms and signs involving the circulatory and respiratory systems: Secondary | ICD-10-CM | POA: Diagnosis not present

## 2023-08-27 DIAGNOSIS — F4310 Post-traumatic stress disorder, unspecified: Secondary | ICD-10-CM | POA: Diagnosis not present

## 2023-08-27 DIAGNOSIS — R011 Cardiac murmur, unspecified: Secondary | ICD-10-CM

## 2023-08-27 NOTE — Patient Instructions (Addendum)
 Medication Instructions:  Your physician recommends that you continue on your current medications as directed. Please refer to the Current Medication list given to you today.   *If you need a refill on your cardiac medications before your next appointment, please call your pharmacy*  Lab Work: Your provider would like for you to have following labs drawn today BMP.   If you have labs (blood work) drawn today and your tests are completely normal, you will receive your results only by: MyChart Message (if you have MyChart) OR A paper copy in the mail If you have any lab test that is abnormal or we need to change your treatment, we will call you to review the results.  Testing/Procedures: Your physician has requested that you have a carotid duplex. This test is an ultrasound of the carotid arteries in your neck. It looks at blood flow through these arteries that supply the brain with blood.   Allow one hour for this exam.  There are no restrictions or special instructions.  This will take place at [address] [facility]) [address], [place] [address]  Please note: We ask at that you not bring children with you during ultrasound (echo/ vascular) testing. Due to room size and safety concerns, children are not allowed in the ultrasound rooms during exams. Our front office staff cannot provide observation of children in our lobby area while testing is being conducted. An adult accompanying a patient to their appointment will only be allowed in the ultrasound room at the discretion of the ultrasound technician under special circumstances. We apologize for any inconvenience.   ZIO XT- Long Term Monitor Instructions  Your physician has requested you wear a ZIO patch monitor for 14 days.  This is a single patch monitor. [employer] supplies one patch monitor per enrollment. Additional stickers are not available. Please do not apply patch if you will be having a Nuclear Stress Test,   Echocardiogram, Cardiac CT, MRI, or Chest Xray during the period you would be wearing the  monitor. The patch cannot be worn during these tests. You cannot remove and re-apply the  ZIO XT patch monitor.  Your ZIO patch monitor will be mailed 3 day USPS to your address on file. It may take 3-5 days  to receive your monitor after you have been enrolled.  Once you have received your monitor, please review the enclosed instructions. Your monitor  has already been registered assigning a specific monitor serial # to you.  Billing and Patient Assistance Program Information  We have supplied [employer] with any of your insurance information on file for billing purposes. [employer] offers a sliding scale Patient Assistance Program for patients that do not have  insurance, or whose insurance does not completely cover the cost of the ZIO monitor.  You must apply for the Patient Assistance Program to qualify for this discounted rate.  To apply, please call [employer] at [phone], select option 4, select option 2, ask to apply for  Patient Assistance Program. Meredeth will ask your household income, and how many people  are in your household. They will quote your out-of-pocket cost based on that information.  [employer] will also be able to set up a 34-month, interest-free payment plan if needed.  Applying the monitor   Shave hair from upper left chest.  Hold abrader disc by orange tab. Rub abrader in 40 strokes over the upper left chest as  indicated in your monitor instructions.  Clean area with 4 enclosed alcohol pads.  Let dry.  Apply patch as indicated in monitor instructions. Patch will be placed under collarbone on left  side of chest with arrow pointing upward.  Rub patch adhesive wings for 2 minutes. Remove white label marked 1. Remove the white  label marked 2. Rub patch adhesive wings for 2 additional minutes.  While looking in a mirror, press and release button in center of patch. A small  green light will  flash 3-4 times. This will be your only indicator that the monitor has been turned on.  Do not shower for the first 24 hours. You may shower after the first 24 hours.  Press the button if you feel a symptom. You will hear a small click. Record Date, Time and  Symptom in the Patient Logbook.  When you are ready to remove the patch, follow instructions on the last 2 pages of Patient  Logbook. Stick patch monitor onto the last page of Patient Logbook.  Place Patient Logbook in the blue and white box. Use locking tab on box and tape box closed  securely. The blue and white box has prepaid postage on it. Please place it in the mailbox as  soon as possible. Your physician should have your test results approximately 7 days after the  monitor has been mailed back to [facility].  Call [facility] Customer Care at [phone] if you have questions regarding  your ZIO XT patch monitor. Call them immediately if you see an orange light blinking on your  monitor.  If your monitor falls off in less than 4 days, contact our Monitor department at [phone].  If your monitor becomes loose or falls off after 4 days call [employer] at [phone] for  suggestions on securing your monitor    Follow-Up: At [facility], you and your health needs are our priority.  As part of our continuing mission to provide you with exceptional heart care, our providers are all part of one team.  This team includes your primary Cardiologist (physician) and Advanced Practice Providers or APPs (Physician Assistants and Nurse Practitioners) who all work together to provide you with the care you need, when you need it.  Your next appointment:   6 week(s)  Provider:   Evalene Lunger, MD or Tylene Lunch, NP

## 2023-08-27 NOTE — Progress Notes (Signed)
  THERAPIST PROGRESS NOTE  Session Time: 10:03 AM - 11:00 AM   Participation Level: Active  Behavioral Response: Well Groomed, Alert, Anxious and Dysphoric  Type of Therapy: Individual Therapy  Treatment Goals addressed: Active BH CCP BIPOLAR DISORDER  LTG: When I don't have to take all this medication. I just want to feel better, not feel so depressed, so anxious. I want to be able to have a normal day. Just not feeling anxious, depressed, other people have normal days, they look happy, laughing.                 Start:  07/29/23    Expected End:  07/27/24      STG: My daughter-in-law and I might get along one day. To improve relationships AEB self-report of improvement by using interpersonal effectiveness skills over the next 90 days.     STG: To not have this level of anxiousness I feel to not have panic attacks so often. To reduce sxs of anxiety AEB reduction of GAD7 scores by using coping mechanisms over the next 90 days.     STG: If I let that anger go and I get depressed and I can get so mad, I'll lose control. I just want to destroy everything. To improve emotion regulation AEB self-report reduction in outbursts by using coping mechanisms over the next 90 days.   ProgressTowards Goals: Progressing  Interventions: Motivational Interviewing and Supportive  Summary: Aysa Larivee is a 68 y.o. female who presents with a history of panic disorder, bipolar disorder, and PTSD. She appeared alert and oriented x5. Albie engaged in discussion about current dynamics with her husband. She also discussed some of her behaviors, like impulsive shopping. Lacie processed memories from childhood and touched on traumatic events. She was tearful as she brought up certain topics. She jumped from topic to topic but seems like she wants to discuss trauma more in-depth in a future session.   Therapist Response: Conducted session with [employer]. Began session with check-in/update since previous session.  Utilized empathetic and reflective listening. Used open-ended questions to facilitate discussion and summarized Latresa's thoughts/feelings. Scheduled additional appointment and concluded session.   Suicidal/Homicidal: No  Plan: Return again in 2 weeks.  Diagnosis: Bipolar disorder, in full remission, most recent episode mixed (HCC)  PTSD (post-traumatic stress disorder)  Collaboration of Care: Medication Management AEB chart review  Patient/Guardian was advised Release of Information must be obtained prior to any record release in order to collaborate their care with an outside provider. Patient/Guardian was advised if they have not already done so to contact the registration department to sign all necessary forms in order for us  to release information regarding their care.   Consent: Patient/Guardian gives verbal consent for treatment and assignment of benefits for services provided during this visit. Patient/Guardian expressed understanding and agreed to proceed.   Almarie JONETTA Ligas, [facility] 08/27/2023

## 2023-08-27 NOTE — Progress Notes (Signed)
 Cardiology Office Note   Date:  08/27/2023  ID:  Ashley Fields, DOB 11/03/1955, MRN [id number] PCP: Edman Marsa PARAS, DO  [facility] Providers Cardiologist:  Evalene Lunger, MD Cardiology APP:  Gerard Frederick, NP     History of Present Illness Ashley Fields is a 68 y.o. female with history of coronary calcification on CT, aorta, cirrhosis, bipolar disorder, prior tobacco abuse, hyperlipidemia, gastric bypass, Hollenhorst plaque (right eye), mild carotid artery disease, arthritis, depression, GERD, hepatitis C, who presents today for follow-up.   Prior history of coronary calcification on CT scan with coronary calcium  score 2456, 99 percentile.  Since medical control, negative stress testing in 2019 and August 2022.  Echocardiogram completed 02/2022 with an LVEF 60 to 60%, no RWMA, mild LVH, and no evidence of valvular heart disease.  ZIO monitor completed 03/09/2022 revealed occasional paroxysmal SVT, no significant sustained arrhythmia.   She was last seen in clinic 03/28/2023 states that she had been having labile blood pressures.  Blood pressure relatively low in the mornings she wakes up with very high in the afternoon.  She denies chest pain, shortness of breath but does have occasional peripheral edema.  She notes that the bouts of low blood pressure she had dizziness and lightheadedness but denies any syncope or near syncopal episodes.  She also had an upcoming surgical procedure.  She was scheduled for a cardiac PET stress for surgical clearance which was denied by her insurance and she was changed to a Lexiscan  Myoview .   She was seen in clinic 06/24/2021 and been doing well.  Continues to suffer from labile blood pressures.  He previously undergone Lexiscan  MPI which unfortunately revealed abnormal probably primary ischemia.  With abnormal results she underwent a left heart catheterization via catheterization for mild to moderate nonobstructive coronary disease with heavy mural  calcification, normal left ventricular systolic function with LVEF 55-85% with mildly elevated filling pressure LVEDP 20 mmHg.  Continue medical therapy and risk factor modification to prevent progression of coronary artery disease was recommended.  She was evaluated in clinic 07/22/2023 where she had been doing fairly well since her heart catheterization.  She was sent for follow-up labs after having her procedure.  There were no other medication changes that were made and further testing that was ordered at that time.   After her appointment she called into the triage line with episodes of chest discomfort.  With her nonobstructive coronary artery disease she was started on Toprol -XL 12.5 mg daily for antianginal effect.  She presented to the [facility] emergency department on 08/25/2023 of with hypotension and loss of consciousness.  She denied any chest pain or shortness of breath.  Had recently started on Repatha  and Toprol -XL.  Patient stated that she was taking her blood pressure today and noted it was low felt dizzy and lightheaded and passed out.  She states that her blood pressures were typically in the systolic 120s and systolic was 68.  Stated that she had previously been prescribed metoprolol  but had not taken it due to reading about blood pressure side effects.  EMS found her hypotensive with systolic in the 60s and she was given 1 L normal saline bolus.  Blood pressure was found to be 68/51, pulse of 65, respiration of 20, temperature 97.5.  Labs are reviewed and show a mild AKI, high-sensitivity troponin negative x 2, serum creatinine 1.15 of up from her baseline of 0.7.  GFR was down to 52.  She was diagnosed with dehydration CT of the  cervical spine/CT with no evidence of acute intracranial abnormality no evidence of cervical spine fracture.  She was considered stable for discharge and was able to be discharged home.  She returns to clinic today after recent visit to the emergency department where  she suffered a syncopal episode from dehydration.  Blood pressure was noted to be in the 60s on arrival to the emergency department.  Patient states that she had gotten up around 4:30 in the morning and her blood pressure was low.  She had been pushing fluids at home and her blood pressure continued to remain low.  After she had fallen in the kitchen and hit her head she requested that her husband take her to the emergency room for further evaluation.  Since that time she has had no further episodes.  She continues to monitor pressures at home regularly without any recurrent drops in pressure.  She is currently not on any antihypertensive medications.  She has had no further recurrent episodes of syncope.  She denies any other symptoms of chest pain, shortness of breath, peripheral edema, lightheadedness or dizziness.  ROS: 10 point review of systems has been reviewed and considered negative the exception was been listed in the HPI  Studies Reviewed EKG Interpretation Date/Time:  Tuesday August 27 2023 08:25:59 EDT Ventricular Rate:  80 PR Interval:  170 QRS Duration:  86 QT Interval:  380 QTC Calculation: 438 R Axis:   43  Text Interpretation: Normal sinus rhythm Normal ECG When compared with ECG of 25-Aug-2023 15:16, No significant change was found Confirmed by Gerard Frederick ([id number]) on 08/27/2023 8:27:23 AM    [facility] 07/02/2023 Conclusions: Mild to moderate, non-obstructive coronary artery disease with heavy mural calcification, as detailed below. Normal left ventricular systolic function (LVEF 55-65%) with mildly elevated filling pressure (LVEDP 20 mmHg).   Recommendations: Continue medical therapy and risk factor modification to prevent progression of coronary artery disease.   Lexiscan  MPI 06/21/2023   Abnormal, probably low risk pharmacologic myocardial perfusion stress test.   There is a moderate in size, mild in severity, partially reversible defect.  The apical septal and apical defects  are fixed and most likely represent artifact.  The apical anterior defect is reversible and suspicious for ischemia.   Left ventricular systolic function is normal (LVEF > 65%).   Coronary artery calcification is noted on the attenuation correction CT.   Sensitivity and specificity of the study are degraded by attenuation from breast implants and extracardiac activity.   Compared to the prior study from 08/08/2020, the apical anterior reversible defect appears new   Event Monitor (Zio) 03/09/2022 Conclusion Occasional paroxysmal SVT. No significant sustained arrhythmias. Paroxysmal SVT coincided with patient triggered events.   2D echo 02/28/2022 1. Left ventricular ejection fraction, by estimation, is 60 to 65%. The  left ventricle has normal function. The left ventricle has no regional  wall motion abnormalities. There is mild left ventricular hypertrophy.  Left ventricular diastolic function  could not be evaluated.   2. Right ventricular systolic function is normal. The right ventricular  size is normal.   3. The mitral valve is normal in structure. No evidence of mitral valve  regurgitation.   4. The aortic valve was not well visualized. Aortic valve regurgitation  is not visualized.   5. The inferior vena cava is normal in size with greater than 50%  respiratory variability, suggesting right atrial pressure of 3 mmHg.    Myoview  Lexicsan 08/2020 Narrative & Impression  There was no ST  segment deviation noted during stress. No T wave inversion was noted during stress. Defect 1: There is a small defect of mild severity present in the apex location likely artifact. The study is normal. This is a low risk study. The left ventricular ejection fraction is normal (55-65%). CT attenuation images shows moderate aortic and coronary calcifications.     Myoview  lexiscan  2019 Narrative & Impression  Pharmacological myocardial perfusion imaging study with no significant  ischemia Normal  wall motion, EF estimated at 83% No EKG changes concerning for ischemia at peak stress or in recovery. Low risk scan    Cardiac CT scoring 2019 IMPRESSION: Coronary calcium  score of 2456 . This was 80 th percentile for age and sex matched control.   Consider f/u heart catheterization or perfusion study given how extensive the coronary calcification is Risk Assessment/Calculations           Physical Exam VS:  BP 126/70   Pulse 80   Ht 5' 2 (1.575 m)   Wt 147 lb 3.2 oz (66.8 kg)   SpO2 98%   BMI 26.92 kg/m        Wt Readings from Last 3 Encounters:  08/27/23 147 lb 3.2 oz (66.8 kg)  08/25/23 144 lb (65.3 kg)  07/22/23 148 lb (67.1 kg)    GEN: Well nourished, well developed in no acute distress NECK: No JVD; No carotid bruits CARDIAC: RRR, II/VI systolic murmur RUSB, without rubs or gallops RESPIRATORY:  Clear to auscultation without rales, wheezing or rhonchi  ABDOMEN: Soft, non-tender, non-distended EXTREMITIES:  No edema; No deformity   ASSESSMENT AND PLAN Syncope with collapse related to hypotension with systolic blood pressures in the 60s. Evaluated in the ED and treated for dehydration given 2L of IVF's.  She was noted to have an AKI in the emergency room with a serum creatinine that jumped to 1.15.  With that she has not had any reoccurrence of syncope since that time and her blood pressures remain stable in the 120s.  She has been sent for an updated BMP.  She is also being scheduled for ZIO XT monitor to wear for 2 weeks and an updated carotid duplex with the last study being done in 2019.  Patient has been advised that per Department of Transportation that she should not be driving for 6 months or until the cause of her syncopal event has not been found and treated.  Nonobstructive coronary artery disease with recent left heart catheterization revealing mild to moderate nonobstructive coronary artery disease with heavy mural calcification.  Normal left ventricular  systolic function at 55 to 60%, mildly elevated blood pressure with LVEDP of 20 mmHg.  Left anterior descending had mild diffuse disease throughout the vessel was severely calcified left circumflex had mild diffuse disease was severely calcified, RCA showed proximal RCA lesion 55% stenosis, additional 40% stenosis in mid RCA lesion is 60% stenosis recommendation was to continue medical therapy and risk factor modification to prevent progression of coronary artery disease.  She is continue aspirin  81 mg daily, ezetimibe  10 mg daily and Repatha  140 mg every 2 weeks.  EKG today reveals sinus rhythm with a rate of 80 with no acute ischemic changes noted.  Mixed hyperlipidemia with an LDL 46.  Remains at goal less than 55.  She is continued on ezetimibe  10 mg daily and Repatha  140 milligrams every 2 weeks, recommend reducing rosuvastatin  to 20 mg daily and repeat lipid on return.    Labile blood pressures with recent drop  in pressure to 60 during recent emergency department visit.  After 2 L of IV fluid blood pressure is back to 120s systolic at home.  Of note patient is not on any antihypertensive medications.  She has been advised to continue to monitor pressure during the day.  Heart murmur previously noted on exam with echocardiogram that revealed LVEF of 60 to 60%, no RWMA, mild LVH, and no valvular abnormalities.  Continue to monitor with surveillance studies.  Hollenhorst plaque on the right with carotid duplex revealing minimal bilateral disease.  Cholesterol remains at goal.  With recent hypotension and syncopal episode a repeat study has been ordered.  She has been continued on aspirin  and statin therapy.    Dispo: Patient to return to see MD/APP in 6 weeks or sooner if needed for further evaluation  Signed, Catrell Morrone, NP

## 2023-08-28 LAB — BASIC METABOLIC PANEL WITH GFR
BUN/Creatinine Ratio: 13 (ref 12–28)
BUN: 10 mg/dL (ref 8–27)
CO2: 25 mmol/L (ref 20–29)
Calcium: 9.2 mg/dL (ref 8.7–10.3)
Chloride: 102 mmol/L (ref 96–106)
Creatinine, Ser: 0.79 mg/dL (ref 0.57–1.00)
Glucose: 78 mg/dL (ref 70–99)
Potassium: 5.2 mmol/L (ref 3.5–5.2)
Sodium: 142 mmol/L (ref 134–144)
eGFR: 82 mL/min/1.73 (ref >59)

## 2023-08-29 ENCOUNTER — Ambulatory Visit: Payer: Self-pay | Admitting: Cardiology

## 2023-08-29 ENCOUNTER — Encounter: Payer: Self-pay | Admitting: Family Medicine

## 2023-08-29 ENCOUNTER — Ambulatory Visit (INDEPENDENT_AMBULATORY_CARE_PROVIDER_SITE_OTHER): Admitting: Family Medicine

## 2023-08-29 ENCOUNTER — Ambulatory Visit: Admitting: Cardiology

## 2023-08-29 VITALS — BP 108/64 | HR 80 | Ht 62.0 in | Wt 147.2 lb

## 2023-08-29 DIAGNOSIS — I959 Hypotension, unspecified: Secondary | ICD-10-CM | POA: Diagnosis not present

## 2023-08-29 DIAGNOSIS — L570 Actinic keratosis: Secondary | ICD-10-CM | POA: Diagnosis not present

## 2023-08-29 DIAGNOSIS — N179 Acute kidney failure, unspecified: Secondary | ICD-10-CM | POA: Diagnosis not present

## 2023-08-29 DIAGNOSIS — R55 Syncope and collapse: Secondary | ICD-10-CM | POA: Diagnosis not present

## 2023-08-29 NOTE — Progress Notes (Signed)
 Subjective:    Patient ID: Ashley Fields, female    DOB: 08-22-1955, 68 y.o.   MRN: [id number]  Ashley Fields is a 68 y.o. female presenting on 08/29/2023 for Low Blood Pressure, ED Follow-up Syncope   HPI  Discussed the use of AI scribe software for clinical note transcription with the patient, who gave verbal consent to proceed.  History of Present Illness   Ashley Fields is a 68 year old female who presents with episodes of syncope and hypotension.   ED FOLLOW-UP VISIT  Hospital/Location: [facility] Date of ED Visit: 08/25/23  Reason for Presenting to ED: Dehydration, Syncope  FOLLOW-UP  - ED provider note and record have been reviewed - Patient presents today about 4 days after recent ED visit.   Syncope and hypotension - Experienced a significant episode of syncope on Sunday 4 days ago, resulting in emergency room evaluation - Blood pressure during episode was reported to be 60s/40s - Prior to syncope, experienced lightheadedness and dizziness - Sustained a fall with head impact, resulting in bruises and a hematoma; no recollection of the fall itself, but recalls pain from head impact - She was taken to ED  Emergency department evaluation and diagnostic workup - Comprehensive workup included blood tests, EKG, and imaging studies - Blood panel indicated dehydration - Heart enzymes were normal, troponin and delta lab - CT scan of the brain and spine performed - Chest x-ray showed R elevated hemidiaphragm unrelated  - Improves BP overall but has still had some mild low BP in AM, despite efforts with hydration - Blood pressure tends to be low in the mornings and higher in the afternoons, but remained low throughout the day of the syncope episode - Not currently taking antihypertensive medications; declined metoprolol  due to concerns about further lowering blood pressure - Currently taking a opioid therapy that may lower BP  - Currently using a Zio patch monitor for cardiac rhythm  monitoring - Scheduled for carotid ultrasound for further evaluation  Seen already by Cardiology on 8/19  Lab repeat from Cardiologist shows normalized kidney function, electrolytes  - New medications on discharge: none - Changes to current meds on discharge: none  I have reviewed the discharge medication list, and have reconciled the current and discharge medications today.      06/19/2023   10:05 AM 03/14/2023   11:44 AM 10/31/2022    8:35 AM  Depression screen PHQ 2/9  Decreased Interest  1 1  Down, Depressed, Hopeless  0 1  PHQ - 2 Score  1 2  Altered sleeping  1 0  Tired, decreased energy  1 1  Change in appetite  0 0  Feeling bad or failure about yourself   0 0  Trouble concentrating  0 0  Moving slowly or fidgety/restless  0 0  Suicidal thoughts  0 0  PHQ-9 Score  3 3  Difficult doing work/chores  Somewhat difficult Not difficult at all     Information is confidential and restricted. Go to Review Flowsheets to unlock data.       06/19/2023   10:04 AM 03/14/2023   11:44 AM 10/31/2022    8:35 AM 10/15/2022   12:02 PM  GAD 7 : Generalized Anxiety Score  Nervous, Anxious, on Edge  1 1   Control/stop worrying  0 0   Worry too much - different things  0 1   Trouble relaxing  1 1   Restless  0 0   Easily annoyed or irritable  1 1   Afraid - awful might happen  0 0   Total GAD 7 Score  3 4   Anxiety Difficulty  Not difficult at all       Information is confidential and restricted. Go to Review Flowsheets to unlock data.    Social History   Tobacco Use   Smoking status: Former    Current packs/day: 0.00    Average packs/day: 0.3 packs/day for 10.0 years (2.5 ttl pk-yrs)    Types: Cigarettes    Start date: 11/28/1985    Quit date: 11/29/1995    Years since quitting: 27.7    Passive exposure: Past   Smokeless tobacco: Former   Tobacco comments:    Quite 1997, didnt smoke hardly at all when I was smoking.  Vaping Use   Vaping status: Never Used  Substance  Use Topics   Alcohol use: No   Drug use: No    Review of Systems Per HPI unless specifically indicated above     Objective:    BP 108/64 (BP Location: Right Arm, Patient Position: Sitting, Cuff Size: Normal)   Pulse 80   Ht 5' 2 (1.575 m)   Wt 147 lb 4 oz (66.8 kg)   SpO2 96%   BMI 26.93 kg/m   Wt Readings from Last 3 Encounters:  08/29/23 147 lb 4 oz (66.8 kg)  08/27/23 147 lb 3.2 oz (66.8 kg)  08/25/23 144 lb (65.3 kg)    Physical Exam Vitals and nursing note reviewed.  Constitutional:      General: She is not in acute distress.    Appearance: She is well-developed. She is not diaphoretic.     Comments: Well-appearing, comfortable, cooperative  HENT:     Head: Normocephalic and atraumatic.  Eyes:     General:        Right eye: No discharge.        Left eye: No discharge.     Conjunctiva/sclera: Conjunctivae normal.  Neck:     Thyroid : No thyromegaly.  Cardiovascular:     Rate and Rhythm: Normal rate and regular rhythm.     Heart sounds: Normal heart sounds. No murmur heard. Pulmonary:     Effort: Pulmonary effort is normal. No respiratory distress.     Breath sounds: Normal breath sounds. No wheezing or rales.  Musculoskeletal:        General: Normal range of motion.     Cervical back: Normal range of motion and neck supple.  Lymphadenopathy:     Cervical: No cervical adenopathy.  Skin:    General: Skin is warm and dry.     Findings: No erythema or rash.  Neurological:     Mental Status: She is alert and oriented to person, place, and time.  Psychiatric:        Behavior: Behavior normal.     Comments: Well groomed, good eye contact, normal speech and thoughts     I have personally reviewed the radiology report from 08/25/23 on CT Head CT Neck.  Study Result CLINICAL DATA: Head and neck trauma. Syncopal episode with head strike. Dizziness and hypotension.  EXAM: CT HEAD WITHOUT CONTRAST  CT CERVICAL SPINE WITHOUT  CONTRAST  TECHNIQUE: Multidetector CT imaging of the head and cervical spine was performed following the standard protocol without intravenous contrast. Multiplanar CT image reconstructions of the cervical spine were also generated.  RADIATION DOSE REDUCTION: This exam was performed according to the departmental dose-optimization program which includes automated exposure control, adjustment of  the mA and/or kV according to patient size and/or use of iterative reconstruction technique.  COMPARISON: Head CT 03/11/2021  FINDINGS: CT HEAD FINDINGS  Brain: There is no evidence of an acute infarct, intracranial hemorrhage, mass, midline shift, or extra-axial fluid collection. Cerebral volume is normal. The ventricles are normal in size.  Vascular: No hyperdense vessel.  Skull: No acute fracture or suspicious lesion.  Sinuses/Orbits: Visualized paranasal sinuses and mastoid air cells are clear. Bilateral cataract extraction.  Other: None.  CT CERVICAL SPINE FINDINGS  Alignment: Straightening of the normal cervical lordosis. No significant listhesis.  Skull base and vertebrae: No acute fracture or suspicious lesion. Moderate atlantal dental degenerative changes.  Soft tissues and spinal canal: No prevertebral fluid or swelling. No visible canal hematoma.  Disc levels: C4-C7 ACDF with solid arthrodesis. Disc degeneration at C3-4 with prominent anterior vertebral osteophytes, left greater than right uncovertebral spurring, and severe left facet arthrosis likely resulting in mild spinal stenosis and mild-to-moderate left neural foraminal stenosis.  Upper chest: Clear lung apices.  Other: Mild atherosclerotic calcification at the carotid bifurcations.  IMPRESSION: 1. No evidence of acute intracranial abnormality. 2. No acute cervical spine fracture. 3. C4-C7 ACDF.   Electronically Signed By: Dasie Hamburg M.D. On: 08/25/2023 16:16  Results for orders placed or  performed in visit on 08/27/23  Basic metabolic panel with GFR   Collection Time: 08/27/23  9:08 AM  Result Value Ref Range   Glucose 78 70 - 99 mg/dL   BUN 10 8 - 27 mg/dL   Creatinine, Ser 9.20 0.57 - 1.00 mg/dL   eGFR 82 >40 fO/fpw/8.26   BUN/Creatinine Ratio 13 12 - 28   Sodium 142 134 - 144 mmol/L   Potassium 5.2 3.5 - 5.2 mmol/L   Chloride 102 96 - 106 mmol/L   CO2 25 20 - 29 mmol/L   Calcium  9.2 8.7 - 10.3 mg/dL      Assessment & Plan:   Problem List Items Addressed This Visit   None Visit Diagnoses       Hypotension, unspecified hypotension type    -  Primary     AKI (acute kidney injury) (HCC)         Vasovagal syncope         Actinic keratoses       Relevant Orders   Ambulatory referral to Dermatology         Syncope and hypotension with resolved dehydration and acute kidney injury ED Follow-up from 8/17 Recent syncope and hypotension with low blood pressure, leading to fall and head injury. Dehydration resolved, kidney function normalized. Morning hypotension persists. Pain medication may contribute. Cardiology evaluation ongoing.  BP normalized now.  Repeat labs from 2 days ago shows normalized AKI  Discussion today after review of ED and Cardiology notes. Provided reassurance that she has had a thorough work up labs, imaging, EKG, and has upcoming scheduled ZIO patch (has patch now just not placed yet) and Carotid dopplers. We discussed that she has no anti hypertension therapy so there are limited options remaining to avoid lowering her BP, goal is to maintain fluid balance, appropriate rehydration sodium to keep volume.  - Follow up with cardiology for Zio patch and carotid ultrasound results. - Maintain hydration with electrolyte solutions. - Avoid medications that lower blood pressure. Advised some caution with her chronic opioid use, as pain medication may be reducing her BP. - Discuss potential midodrine use with cardiology if hypotension  persists.  Head injury without loss of consciousness  Sustained head injury during syncopal episode. CT scan showed no acute injury. - Monitor for new or worsening neurological symptoms. - Ensure fall precautions are in place.  Suspected actinic keratosis of the face Presence of rough spot on face, suspected actinic keratosis, with increased size. - Refer to dermatology for evaluation of facial lesion.        Orders Placed This Encounter  Procedures   Ambulatory referral to Dermatology    Referral Priority:   Routine    Referral Type:   Consultation    Referral Reason:   Specialty Services Required    Requested Specialty:   Dermatology    Number of Visits Requested:   1    No orders of the defined types were placed in this encounter.   Follow up plan: Return if symptoms worsen or fail to improve.   Marsa Officer, DO [facility] 08/29/2023, 11:42 AM

## 2023-08-29 NOTE — Progress Notes (Signed)
 Kidney function is back to baseline.  No medication changes needed at this time.

## 2023-08-29 NOTE — Patient Instructions (Addendum)
 Thank you for coming to the office today.  Keep up with heart doctors, ZIO patch monitor and further testing  Keep rehydrating with the solution / electrolytes / sodium etc.  Referral Dermatology stay tuned.  Please schedule a Follow-up Appointment to: Return if symptoms worsen or fail to improve.  If you have any other questions or concerns, please feel free to call the office or send a message through MyChart. You may also schedule an earlier appointment if necessary.  Additionally, you may be receiving a survey about your experience at our office within a few days to 1 week by e-mail or mail. We value your feedback.  Marsa Officer, DO [facility], [place]

## 2023-09-05 ENCOUNTER — Ambulatory Visit
Admission: RE | Admit: 2023-09-05 | Discharge: 2023-09-05 | Disposition: A | Discharge status: Home / Self Care | Source: Ambulatory Visit

## 2023-09-05 ENCOUNTER — Ambulatory Visit: Payer: Self-pay

## 2023-09-05 ENCOUNTER — Ambulatory Visit (INDEPENDENT_AMBULATORY_CARE_PROVIDER_SITE_OTHER)

## 2023-09-05 VITALS — BP 138/70 | HR 63 | Ht 62.0 in | Wt 147.1 lb

## 2023-09-05 DIAGNOSIS — R0781 Pleurodynia: Secondary | ICD-10-CM | POA: Diagnosis not present

## 2023-09-05 DIAGNOSIS — R079 Chest pain, unspecified: Secondary | ICD-10-CM | POA: Diagnosis not present

## 2023-09-05 DIAGNOSIS — I700 Atherosclerosis of aorta: Secondary | ICD-10-CM | POA: Diagnosis not present

## 2023-09-05 NOTE — Progress Notes (Signed)
 Acute Patient Visit  Physician: Alaster Asfaw A Caileigh Canche, MD  Patient: Ashley Fields MRN: [id number] DOB: [birth year] PCP: Edman Marsa PARAS, DO     Subjective:   Chief Complaint  Patient presents with   Acute Visit    Pain in back when her husband adjusted her back. Stated there is pain when she takes a deep breath. Stated it started 3 days ago.     HPI: The patient is a 68 y.o. female who presents today for:   Discussed the use of AI scribe software for clinical note transcription with the patient, who gave verbal consent to proceed.  History of Present Illness   Ashley Fields is a 68 year old female who presents with severe chest pain following a back adjustment  Chest pain - Severe,pain in the right side of the chest - Onset three days ago  - Described a popping sensation in the chest at onset, followed by intense pain - Pain radiates through the bones on the right side of the chest - Pain worsens with deep breathing - Pain has progressively worsened over the past three days - No associated cough   Musculoskeletal history and pain management - History of multiple back surgeries - Currently managed with oxycodone  7.5/325 mg for pain - Concerned about possible rib fracture or soft tissue injury - Previous bone density scan showed good bone density per patient report     ROS:   As noted in the HPI    ASSESMENT/PLAN:  Encounter Diagnoses  Name Primary?   Rib pain Yes    No orders of the defined types were placed in this encounter.   Assessment and Plan    Right-sided chest wall pain (possible rib injury) Acute right-sided chest wall pain following a chiropractic adjustment, characterized by a popping sensation and searing pain, exacerbated by deep breathing and affecting sleep. Differential diagnosis includes rib fracture, soft tissue strain, or bruising.   Good bone density reduces the likelihood of osteopenia-related fracture. The pain is not  proximal enough to affect her breast implant unless significant displacement occurs. Rib fractures can occur from minimal force, especially in women with smaller bones, and soft tissue injury or nerve irritation are also possible causes. - Order chest X-ray to evaluate for rib fracture or other abnormalities. - Advise use of regular pain medication and acetaminophen  for pain management. - Recommend using a low back brace for additional support and stability. - Discuss the option of using a compression shirt or ACE bandage for comfort.      OBJECTIVE: Vitals:   09/05/23 1425  BP: 138/70  Pulse: 63  SpO2: 98%  Weight: 147 lb 2 oz (66.7 kg)  Height: 5' 2 (1.575 m)    Body mass index is 26.91 kg/m.   Physical Exam Vitals reviewed.  Constitutional:      Appearance: Normal appearance. Well-developed with normal weight.  Cardiovascular:     Rate and Rhythm: Normal rate and regular rhythm. Normal heart sounds. Normal peripheral pulses Chest:  Pain with palpation of 4th 5th rib lateral Pulmonary:     Normal breath sounds with normal effort Skin:    General: Skin is warm and dry without noticeable rash. Neurological:     General: No focal deficit present.  Psychiatric:        Mood and Affect: Mood, behavior and cognition normal       Allergies Patient is allergic to flagyl [metronidazole], cymbalta [duloxetine hcl], and tape.  Past Medical History  Patient  has a past medical history of Abdominal wall seroma (postoperative) (2025), Allergy, Anxiety, Aortic atherosclerosis (HCC), Arthritis, Back pain, Bilateral carotid artery disease (HCC), Bipolar disorder (HCC), Blood transfusion without reported diagnosis (1977), CAD (coronary artery disease), Chronic kidney disease, DDD (degenerative disc disease), cervical, Depression, Disp fx of cuboid bone of right foot, init for clos fx (01/01/2017), Displacement of breast implant (2025), Fibromyalgia, GERD (gastroesophageal reflux disease),  Headache, Hepatitis C virus infection cured after antiviral drug therapy, History of 2019 novel coronavirus disease (COVID-19) (01/23/2020), Hollenhorst plaque, right eye, Hyperlipidemia, Hypoglycemia, Insomnia, Iron  deficiency anemia following bariatric surgery, Long-term use of aspirin  therapy, Lumbar radiculopathy, Neck pain, Neuromuscular disorder (HCC) (1997), Osteoporosis, PSVT (paroxysmal supraventricular tachycardia) (HCC), Systolic murmur, Thyroid  disease, and Urine incontinence.  Surgical History Patient  has a past surgical history that includes Cesarean section (07/27/1981); Total abdominal hysterectomy (N/A, 1987); Breast reduction surgery (Bilateral, 1997); Laparoscopic cholecystectomy (N/A, 03/2004); Appendectomy (12/2008); Gastric bypass (12/24/2003); mini tummy tuck (05/07/2016); breast lift bilateral, implants (05/18/2015); Lumbar laminectomy (06/2006); 5 miscarriages; ACDF C4-C7 (02/10/2015); IVC FILTER INSERTION (12/2003); Spinal cord stimulator implant (11/2010); Shoulder acromioplasty (Left, 03/27/2013); Reduction mammaplasty (Bilateral, 1997); Augmentation mammaplasty (Bilateral, 2015); Augmentation mammaplasty (Bilateral, 2018); Cosmetic surgery; Eye surgery (2017); Colonoscopy with propofol  (N/A, 06/13/2020); Esophagogastroduodenoscopy (egd) with propofol  (N/A, 06/13/2020); Anterior lumbar fusion (N/A, 12/18/2021); Abdominal exposure (N/A, 12/18/2021); Dilation and curettage of uterus; Excision of abdominal wall tumor (N/A, 04/08/2023); LEFT HEART CATH AND CORONARY ANGIOGRAPHY (Left, 07/02/2023); and Cardioversion.  Family History Pateint's family history includes Arthritis in her brother, mother, and sister; Breast cancer in her paternal aunt; COPD in her mother, sister, and sister; Cancer in her maternal grandfather; Colon polyps in her sister; Depression in her sister, sister, and sister; Diabetes in her brother, brother, and mother; Early death in her father; Hearing loss in her  sister; Heart attack in her brother, mother, and sister; Heart attack (age of onset: 65) in her father; Heart disease in her brother, brother, brother, brother, father, mother, and sister; Hypertension in her brother, brother, brother, mother, sister, sister, and sister; Lung cancer (age of onset: 12) in her mother; Miscarriages / Stillbirths in her maternal aunt and paternal aunt; Obesity in her brother, brother, mother, sister, and sister; Stroke in her maternal grandfather and mother; Varicose Veins in her brother, mother, and sister.  Social History Patient  reports that she quit smoking about 27 years ago. Her smoking use included cigarettes. She started smoking about 37 years ago. She has a 2.5 pack-year smoking history. She has been exposed to tobacco smoke. She has quit using smokeless tobacco. She reports that she does not drink alcohol and does not use drugs.    09/05/2023
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
## 2023-09-06 ENCOUNTER — Ambulatory Visit: Payer: Self-pay

## 2023-09-09 ENCOUNTER — Other Ambulatory Visit: Payer: Self-pay | Admitting: Family Medicine

## 2023-09-09 DIAGNOSIS — R110 Nausea: Secondary | ICD-10-CM

## 2023-09-10 ENCOUNTER — Telehealth: Payer: Self-pay

## 2023-09-10 DIAGNOSIS — T85848A Pain due to other internal prosthetic devices, implants and grafts, initial encounter: Secondary | ICD-10-CM

## 2023-09-10 DIAGNOSIS — N644 Mastodynia: Secondary | ICD-10-CM

## 2023-09-10 NOTE — Telephone Encounter (Signed)
 Copied from CRM [id number]. Topic: Referral - Question >> Sep 10, 2023 10:03 AM DeAngela L wrote: Reason for CRM: patient would like to ask if she could have a referral for imaging to scan her left breast, patient is asking, cause she was told that Dr Edman has to say yes or no, what's she needs to do to help her imaging  Patient is ok with MyChart  Pt num [phone] (M)

## 2023-09-10 NOTE — Telephone Encounter (Signed)
 I attempted to call patient back. Did not reach her. I understand the situation. I sent her a detailed mychart message to understand more about the location of her left chest or breast symptoms so I can order the proper imaging. She can reply on mychart.  Basically I need to figure out if she has actual breast symptoms or if it is more chest wall and in an area not involving breast.  If the breast is involved, I would need to know which part of the breast and then I can order diagnostic mammogram / ultrasound.  If the breast is not involved, and she would like to do the routine mammogram, we can re order, and she can call to schedule but tell them that there is no active breast symptom or complaint  Marsa Officer, DO [facility] 09/10/2023, 5:06 PM

## 2023-09-10 NOTE — Telephone Encounter (Signed)
 Requested medication (s) are due for refill today: routing for review  Requested medication (s) are on the active medication list: yes  Last refill:  05/02/23  Future visit scheduled: {no  Notes to clinic:  Unable to refill per protocol, cannot delegate.      Requested Prescriptions  Pending Prescriptions Disp Refills   ondansetron  (ZOFRAN -ODT) 4 MG disintegrating tablet [Pharmacy Med Name: ONDANSETRON  ODT 4 MG TABLET] 30 tablet 2    Sig: TAKE 1 TABLET BY MOUTH EVERY 8 HOURS AS NEEDED FOR NAUSEA AND VOMITING     Not Delegated - Gastroenterology: Antiemetics - ondansetron  Failed - 09/10/2023  3:26 PM      Failed - This refill cannot be delegated      Passed - AST in normal range and within 360 days    AST  Date Value Ref Range Status  08/25/2023 38 15 - 41 U/L Final         Passed - ALT in normal range and within 360 days    ALT  Date Value Ref Range Status  08/25/2023 30 0 - 44 U/L Final         Passed - Valid encounter within last 6 months    Recent Outpatient Visits           5 days ago Rib pain   [facility] [place], Dolores A, MD   1 week ago Hypotension, unspecified hypotension type   [facility] Edman, Marsa PARAS, DO   4 months ago Annual physical exam   [facility] Edman Marsa PARAS, DO   6 months ago Hypotension, unspecified hypotension type   [facility] Edman Marsa PARAS, DO   6 months ago Acute non-recurrent frontal sinusitis   [facility] [place], Marsa PARAS, DO       Future Appointments             In 6 days Raymund, Lauraine BROCKS, MD [facility]   In 4 weeks Gerard Frederick, NP [facility] [place]   In 1 month Hammock, Frederick, NP [facility] [place]   In 1 month Edman, Marsa PARAS, DO  [facility] [place]

## 2023-09-11 ENCOUNTER — Ambulatory Visit (INDEPENDENT_AMBULATORY_CARE_PROVIDER_SITE_OTHER): Admitting: Professional Counselor

## 2023-09-11 DIAGNOSIS — F3162 Bipolar disorder, current episode mixed, moderate: Secondary | ICD-10-CM | POA: Diagnosis not present

## 2023-09-11 DIAGNOSIS — F4310 Post-traumatic stress disorder, unspecified: Secondary | ICD-10-CM

## 2023-09-11 NOTE — Addendum Note (Signed)
 Addended by: EDMAN MARSA PARAS on: 09/11/2023 07:23 PM   Modules accepted: Orders
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
## 2023-09-12 DIAGNOSIS — Z981 Arthrodesis status: Secondary | ICD-10-CM | POA: Diagnosis not present

## 2023-09-12 DIAGNOSIS — M48061 Spinal stenosis, lumbar region without neurogenic claudication: Secondary | ICD-10-CM | POA: Diagnosis not present

## 2023-09-12 DIAGNOSIS — M542 Cervicalgia: Secondary | ICD-10-CM | POA: Diagnosis not present

## 2023-09-12 DIAGNOSIS — Z79891 Long term (current) use of opiate analgesic: Secondary | ICD-10-CM | POA: Diagnosis not present

## 2023-09-12 DIAGNOSIS — G894 Chronic pain syndrome: Secondary | ICD-10-CM | POA: Diagnosis not present

## 2023-09-12 DIAGNOSIS — Z79899 Other long term (current) drug therapy: Secondary | ICD-10-CM | POA: Diagnosis not present

## 2023-09-12 DIAGNOSIS — M791 Myalgia, unspecified site: Secondary | ICD-10-CM | POA: Diagnosis not present

## 2023-09-12 DIAGNOSIS — M461 Sacroiliitis, not elsewhere classified: Secondary | ICD-10-CM | POA: Diagnosis not present

## 2023-09-12 DIAGNOSIS — M47896 Other spondylosis, lumbar region: Secondary | ICD-10-CM | POA: Diagnosis not present

## 2023-09-12 DIAGNOSIS — M5412 Radiculopathy, cervical region: Secondary | ICD-10-CM | POA: Diagnosis not present

## 2023-09-12 DIAGNOSIS — M533 Sacrococcygeal disorders, not elsewhere classified: Secondary | ICD-10-CM | POA: Diagnosis not present

## 2023-09-14 DIAGNOSIS — R55 Syncope and collapse: Secondary | ICD-10-CM | POA: Diagnosis not present

## 2023-09-16 ENCOUNTER — Ambulatory Visit (INDEPENDENT_AMBULATORY_CARE_PROVIDER_SITE_OTHER)

## 2023-09-16 ENCOUNTER — Ambulatory Visit: Admitting: Family Medicine

## 2023-09-16 DIAGNOSIS — L820 Inflamed seborrheic keratosis: Secondary | ICD-10-CM

## 2023-09-16 DIAGNOSIS — W908XXA Exposure to other nonionizing radiation, initial encounter: Secondary | ICD-10-CM | POA: Diagnosis not present

## 2023-09-16 DIAGNOSIS — D1801 Hemangioma of skin and subcutaneous tissue: Secondary | ICD-10-CM

## 2023-09-16 DIAGNOSIS — Z1283 Encounter for screening for malignant neoplasm of skin: Secondary | ICD-10-CM | POA: Diagnosis not present

## 2023-09-16 DIAGNOSIS — D229 Melanocytic nevi, unspecified: Secondary | ICD-10-CM

## 2023-09-16 DIAGNOSIS — L814 Other melanin hyperpigmentation: Secondary | ICD-10-CM | POA: Diagnosis not present

## 2023-09-16 DIAGNOSIS — L578 Other skin changes due to chronic exposure to nonionizing radiation: Secondary | ICD-10-CM | POA: Diagnosis not present

## 2023-09-16 DIAGNOSIS — L821 Other seborrheic keratosis: Secondary | ICD-10-CM | POA: Diagnosis not present

## 2023-09-16 NOTE — Progress Notes (Addendum)
   New Patient Visit   Subjective  Ashley Fields is a 68 y.o. female who presents for the following: Place at R cheek that became raised, went away on its own but came back a few months ago. Patient states when her PCP looked at it was raised. No personal hx of skin cancer, hx of skin cancer in siblings.    The following portions of the chart were reviewed this encounter and updated as appropriate: medications, allergies, medical history  Review of Systems:  No other skin or systemic complaints except as noted in HPI or Assessment and Plan.  Objective  Well appearing patient in no apparent distress; mood and affect are within normal limits.  A focused examination was performed of the following areas: Face, arms  - Angioma(s): Scattered red vascular papule(s) on the trunk - Lentigo/lentigines: Scattered pigmented macules that are tan to brown in color and are somewhat non-uniform in shape and concentrated in the sun-exposed areas of the trunk and upper extremities - Nevus/nevi: Scattered well-demarcated, regular, pigmented macule(s) and/or papule(s) on the scattered diffusely - Seborrheic Keratosis(es): Stuck-on appearing keratotic papule(s) on the trunk, some irritated with redness, crusting, edema, and/or partial avulsion  - Actinic Elastosis: chronic sun damage: dyspigmentation, telangiectasia, and wrinkling   Relevant exam findings are noted in the Assessment and Plan.  R cheek x1 Stuck on waxy paps with erythema  Assessment & Plan   SKIN CANCER SCREENING PERFORMED TODAY.  BENIGN SKIN FINDINGS  - Lentigines  - Seborrheic keratoses - inflamed   - Hemangiomas   - Nevus/Multiple Benign Nevi - Reassurance provided regarding the benign appearance of lesions noted on exam today; no treatment is indicated in the absence of symptoms/changes. - Reinforced importance of photoprotective strategies including liberal and frequent sunscreen use of a broad-spectrum SPF 30 or greater, use of  protective clothing, and sun avoidance for prevention of cutaneous malignancy and photoaging.  Counseled patient on the importance of regular self-skin monitoring as well as routine clinical skin examinations as scheduled.  - Given irritation and symptoms, will proceed with cryotherapy as below  ACTINIC DAMAGE - Chronic condition, secondary to cumulative UV/sun exposure - Recommend daily broad spectrum sunscreen SPF 30+ to sun-exposed areas, reapply every 2 hours as needed.  - Staying in the shade or wearing long sleeves, sun glasses (UVA+UVB protection) and wide brim hats (4-inch brim around the entire circumference of the hat) are also recommended for sun protection.  - Call for new or changing lesions.  INFLAMED SEBORRHEIC KERATOSIS R cheek x1 Symptomatic, irritating, patient would like treated. Destruction of lesion - R cheek x1 Complexity: simple   Destruction method: cryotherapy   Informed consent: discussed and consent obtained   Timeout:  patient name, date of birth, surgical site, and procedure verified Lesion destroyed using liquid nitrogen: Yes   Region frozen until ice ball extended beyond lesion: Yes   Cryo cycles: 1 or 2. Outcome: patient tolerated procedure well with no complications   Post-procedure details: wound care instructions given   Additional details:  Prior to procedure, discussed risks of blister formation, small wound, skin dyspigmentation, or rare scar following cryotherapy. Recommend Vaseline ointment to treated areas while healing.    Return for PRN, w/ Dr. Raymund TBSE.  I, Jacquelynn V. Wilfred, CMA, am acting as scribe for Lauraine JAYSON Raymund, MD .   Documentation: I have reviewed the above documentation for accuracy and completeness, and I agree with the above.  Lauraine JAYSON Raymund, MD

## 2023-09-16 NOTE — Patient Instructions (Addendum)
 Cryotherapy Aftercare  Wash gently with soap and water  everyday.   Apply Vaseline and Band-Aid daily until healed.    Melanoma ABCDEs  Melanoma is the most dangerous type of skin cancer, and is the leading cause of death from skin disease.  You are more likely to develop melanoma if you: Have light-colored skin, light-colored eyes, or red or blond hair Spend a lot of time in the sun Tan regularly, either outdoors or in a tanning bed Have had blistering sunburns, especially during childhood Have a close family member who has had a melanoma Have atypical moles or large birthmarks  Early detection of melanoma is key since treatment is typically straightforward and cure rates are extremely high if we catch it early.   The first sign of melanoma is often a change in a mole or a new dark spot.  The ABCDE system is a way of remembering the signs of melanoma.  A for asymmetry:  The two halves do not match. B for border:  The edges of the growth are irregular. C for color:  A mixture of colors are present instead of an even brown color. D for diameter:  Melanomas are usually (but not always) greater than 6mm - the size of a pencil eraser. E for evolution:  The spot keeps changing in size, shape, and color.  Please check your skin once per month between visits. You can use a small mirror in front and a large mirror behind you to keep an eye on the back side or your body.   If you see any new or changing lesions before your next follow-up, please call to schedule a visit.  Please continue daily skin protection including broad spectrum sunscreen SPF 30+ to sun-exposed areas, reapplying every 2 hours as needed when you're outdoors.   Staying in the shade or wearing long sleeves, sun glasses (UVA+UVB protection) and wide brim hats (4-inch brim around the entire circumference of the hat) are also recommended for sun protection.     Sunscreen  Who needs sunscreen? Everyone. Sunscreen use can  help prevent skin cancer by protecting you from the sun's harmful ultraviolet rays. Anyone can get skin cancer, regardless of age, gender or race. In fact, it is estimated that one in five Americans will develop skin cancer in their lifetime.  Sunscreen alone cannot fully protect you. In addition to wearing sunscreen, dermatologists recommend taking the following steps to protect your skin and find skin cancer early:  Seek shade when appropriate, remembering that the sun's rays are strongest between 10 a.m. and 2 p.m. If your shadow is shorter than you are, seek shade. Dress to protect yourself from the sun by wearing a lightweight long-sleeved shirt, pants, a wide-brimmed hat and sunglasses, when possible.  Use extra caution near water , snow and sand as they reflect the damaging rays of the sun, which can increase your chance of sunburn.  Get vitamin D  safely through a healthy diet that may include vitamin supplements. Don't seek the sun. Avoid tanning beds. Ultraviolet light from the sun and tanning beds can cause skin cancer and wrinkling. If you want to look tan, you may wish to use a self-tanning product, but continue to use sunscreen with it.  When should I use sunscreen? Every day you go outside--even if you're just walking to and from your form of transportation. The sun emits harmful UV rays year-round. Even on cloudy days, up to 80 percent of the sun's harmful UV rays can penetrate your  skin. Snow, sand and water  increase the need for sunscreen because they reflect the sun's rays.  How much sunscreen should I use, and how often should I apply it? Most people only apply 25-50 percent of the recommended amount of sunscreen. Apply enough sunscreen to cover all exposed skin. Most adults need about 1 ounce -- or enough to fill a shot glass -- to fully cover their body.  Don't forget to apply to the tops of your feet, your neck, your ears and the top of your head. Apply sunscreen to dry skin 15  minutes before going outdoors.  Skin cancer also can form on the lips. To protect your lips, apply a lip balm or lipstick that contains sunscreen with an SPF of 30 or higher.  When outdoors, reapply sunscreen approximately every two hours, or after swimming or sweating, according to the directions on the bottle.   Broad-spectrum sunscreens protect against both UVA and UVB rays. What is the difference between the rays? Sunlight consists of two types of harmful rays that reach the earth -- UVA rays and UVB rays. Overexposure to either can lead to skin cancer. In addition to causing skin cancer, here's what each of these rays do:  UVA rays (or aging rays) can prematurely age your skin, causing wrinkles and age spots, and can pass through window glass. UVB rays (or burning rays) are the primary cause of sunburn and are blocked by window glass  There is no safe way to tan. Every time you tan, you damage your skin. As this damage builds, you speed up the aging of your skin and increase your risk for all types of skin cancer.  What is the difference between chemical and physical sunscreens? Chemical sunscreens work like a sponge, absorbing the sun's rays. They contain one or more of the following active ingredients: oxybenzone, avobenzone, octisalate, octocrylene, homosalate and octinoxate. These formulations tend to be easier to rub into the skin without leaving a white residue.   Physical sunscreens work like a shield, sitting sit on the surface of your skin and deflecting the sun's rays. They contain the active ingredients zinc oxide and/or titanium dioxide. Use this sunscreen if you have sensitive skin.   What type of sunscreen should I use? The best type of sunscreen is the one you will use again and again. Just make sure it offers broad-spectrum (UVA and UVB) protection, has an SPF of 30+, and is water -resistant. The kind of sunscreen you use is a matter of personal choice, and may vary depending on the  area of the body to be protected. Available sunscreen options include lotions, creams, gels, ointments, wax sticks and sprays.  Recommended physical sunscreens for face: - Neutrogena Sheer Zinc - Aveeno Positively Mineral Sensitive - CeraVe Hydrating Mineral (also has a tinted version) - La Roche-Posay Anthelios Mineral Face (comes as a cream, lotion, light fluid, and there is also a tinted version).  - EltaMD UV Clear (also has a tinted version)  Recommended physical sunscreens for body: - Neutrogena Sheer Zinc Dry-Touch Sunscreen Sensitive Skin Lotion Broad Spectrum SPF 50 - Aveeno Positively Mineral Sensitive Skin Sunscreen Broad Spectrum SPF 50 - La Roche-Posay Anthelios SPF 50 Mineral Sunscreen - Gentle Lotion - CeraVe Hydrating Mineral Sunscreen SPF 50  Recommended chemical sunscreens for face: - Anthelios UV Correct Face Sunscreen SPF 70 with Niacinamide - Neutrogena Clear Face Oil-Free SPF 50 with Helioplex - Neutrogena Sport Face Oil-Free SPF 70+ with Helioplex - Aveeno Protect + Hydrate Sunscreen For  Face SPF 70 - La Roche-Posay Anthelios Light Fluid Sunscreen for Face SPF 60  Recommended chemical sunscreens for body: - Neutrogena Ultra Sheer Dry-Touch Sunscreen SPF 70 - Aveeno Protect + Hydrate Broad Spectrum All-Day Hydration SPF 60 (comes in a big pump) - La Roche-Posay Anthelios Melt-In Milk Sunscreen SPF 60    Due to recent changes in healthcare laws, you may see results of your pathology and/or laboratory studies on MyChart before the doctors have had a chance to review them. We understand that in some cases there may be results that are confusing or concerning to you. Please understand that not all results are received at the same time and often the doctors may need to interpret multiple results in order to provide you with the best plan of care or course of treatment. Therefore, we ask that you please give us  2 business days to thoroughly review all your results before  contacting the office for clarification. Should we see a critical lab result, you will be contacted sooner.   If You Need Anything After Your Visit  If you have any questions or concerns for your doctor, please call our main line at [phone] and press option 4 to reach your doctor's medical assistant. If no one answers, please leave a voicemail as directed and we will return your call as soon as possible. Messages left after 4 pm will be answered the following business day.   You may also send us  a message via MyChart. We typically respond to MyChart messages within 1-2 business days.  For prescription refills, please ask your pharmacy to contact our office. Our fax number is [phone].  If you have an urgent issue when the clinic is closed that cannot wait until the next business day, you can page your doctor at the number below.    Please note that while we do our best to be available for urgent issues outside of office hours, we are not available 24/7.   If you have an urgent issue and are unable to reach us , you may choose to seek medical care at your doctor's office, retail clinic, urgent care center, or emergency room.  If you have a medical emergency, please immediately call 911 or go to the emergency department.  Pager Numbers  - Dr. Hester: [phone]  - Dr. Jackquline: [phone]  - Dr. Claudene: [phone]   In the event of inclement weather, please call our main line at [phone] for an update on the status of any delays or closures.  Dermatology Medication Tips: Please keep the boxes that topical medications come in in order to help keep track of the instructions about where and how to use these. Pharmacies typically print the medication instructions only on the boxes and not directly on the medication tubes.   If your medication is too expensive, please contact our office at [phone] option 4 or send us  a message through MyChart.   We are unable to tell  what your co-pay for medications will be in advance as this is different depending on your insurance coverage. However, we may be able to find a substitute medication at lower cost or fill out paperwork to get insurance to cover a needed medication.   If a prior authorization is required to get your medication covered by your insurance company, please allow us  1-2 business days to complete this process.  Drug prices often vary depending on where the prescription is filled and some pharmacies may offer cheaper prices.  The website  www.goodrx.com contains coupons for medications through different pharmacies. The prices here do not account for what the cost may be with help from insurance (it may be cheaper with your insurance), but the website can give you the price if you did not use any insurance.  - You can print the associated coupon and take it with your prescription to the pharmacy.  - You may also stop by our office during regular business hours and pick up a GoodRx coupon card.  - If you need your prescription sent electronically to a different pharmacy, notify our office through [facility] or by phone at [phone] option 4.     Si Usted Necesita Algo Despus de Su Visita  Tambin puede enviarnos un mensaje a travs de Clinical cytogeneticist. Por lo general respondemos a los mensajes de MyChart en el transcurso de 1 a 2 das hbiles.  Para renovar recetas, por favor pida a su farmacia que se ponga en contacto con nuestra oficina. Randi lakes de fax es [place] [phone].  Si tiene un asunto urgente cuando la clnica est cerrada y que no puede esperar hasta el siguiente da hbil, puede llamar/localizar a su doctor(a) al nmero que aparece a continuacin.   Por favor, tenga en cuenta que aunque hacemos todo lo posible para estar disponibles para asuntos urgentes fuera del horario de [place], no estamos disponibles las 24 horas del da, los 7 [address]  [address] de la [place].   Si tiene un problema  urgente y no puede comunicarse con nosotros, puede optar por buscar atencin mdica  en el consultorio de su doctor(a), en una clnica privada, en un centro de atencin urgente o en una sala de emergencias.  Si tiene Engineer, drilling, por favor llame inmediatamente al 911 o vaya a la sala de emergencias.  Nmeros de bper  - Dr. Hester: [phone]  - Dra. Jackquline: [phone]  - Dr. Claudene: [phone]   En caso de inclemencias del tiempo, por favor llame a landry capes principal al [phone] para una actualizacin sobre el [place] de cualquier retraso o cierre.  Consejos para la medicacin en dermatologa: Por favor, guarde las cajas en las que vienen los medicamentos de uso tpico para ayudarle a seguir las instrucciones sobre dnde y cmo usarlos. Las farmacias generalmente imprimen las instrucciones del medicamento slo en las cajas y no directamente en los tubos del [place].   Si su medicamento es muy caro, por favor, pngase en contacto con landry rieger llamando al [phone] y presione la opcin 4 o envenos un mensaje a travs de Clinical cytogeneticist.   No podemos decirle cul ser su copago por los medicamentos por adelantado ya que esto es diferente dependiendo de la cobertura de su seguro. Sin embargo, es posible que podamos encontrar un medicamento sustituto a Audiological scientist un formulario para que el seguro cubra el medicamento que se considera necesario.   Si se requiere una autorizacin previa para que su compaa de seguros malta su medicamento, por favor permtanos de 1 a 2 das hbiles para completar este proceso.  Los precios de los medicamentos varan con frecuencia dependiendo del Environmental consultant de dnde se surte la receta y alguna farmacias pueden ofrecer precios ms baratos.  El sitio web www.goodrx.com tiene cupones para medicamentos de Health and safety inspector. Los precios aqu no tienen en cuenta lo que podra costar con la ayuda del seguro (puede ser ms barato  con su seguro), pero el sitio web puede darle el precio si no utiliz Tourist information centre manager.  - Puede  imprimir el cupn correspondiente y llevarlo con su receta a la farmacia.  - Tambin puede pasar por nuestra oficina durante el horario de atencin regular y Education officer, museum una tarjeta de cupones de GoodRx.  - Si necesita que su receta se enve electrnicamente a una farmacia diferente, informe a nuestra oficina a travs de MyChart de [place] o por telfono llamando al [phone] y presione la opcin 4.

## 2023-09-17 ENCOUNTER — Inpatient Hospital Stay

## 2023-09-18 ENCOUNTER — Inpatient Hospital Stay: Attending: Internal Medicine

## 2023-09-18 ENCOUNTER — Ambulatory Visit
Admission: RE | Admit: 2023-09-18 | Discharge: 2023-09-18 | Disposition: A | Discharge status: Home / Self Care | Source: Ambulatory Visit | Attending: Family Medicine | Admitting: Family Medicine

## 2023-09-18 DIAGNOSIS — Z9884 Bariatric surgery status: Secondary | ICD-10-CM | POA: Diagnosis not present

## 2023-09-18 DIAGNOSIS — K912 Postsurgical malabsorption, not elsewhere classified: Secondary | ICD-10-CM | POA: Diagnosis not present

## 2023-09-18 DIAGNOSIS — D508 Other iron deficiency anemias: Secondary | ICD-10-CM

## 2023-09-18 DIAGNOSIS — R92323 Mammographic fibroglandular density, bilateral breasts: Secondary | ICD-10-CM | POA: Diagnosis not present

## 2023-09-18 DIAGNOSIS — T85848A Pain due to other internal prosthetic devices, implants and grafts, initial encounter: Secondary | ICD-10-CM | POA: Insufficient documentation

## 2023-09-18 DIAGNOSIS — N644 Mastodynia: Secondary | ICD-10-CM | POA: Insufficient documentation

## 2023-09-18 DIAGNOSIS — D696 Thrombocytopenia, unspecified: Secondary | ICD-10-CM | POA: Insufficient documentation

## 2023-09-18 DIAGNOSIS — D509 Iron deficiency anemia, unspecified: Secondary | ICD-10-CM | POA: Insufficient documentation

## 2023-09-18 LAB — CBC WITH DIFFERENTIAL (CANCER CENTER ONLY)
Abs Immature Granulocytes: 0.02 K/uL (ref 0.00–0.07)
Basophils Absolute: 0 K/uL (ref 0.0–0.1)
Basophils Relative: 1 %
Eosinophils Absolute: 0.2 K/uL (ref 0.0–0.5)
Eosinophils Relative: 4 %
HCT: 40.2 % (ref 36.0–46.0)
Hemoglobin: 13.4 g/dL (ref 12.0–15.0)
Immature Granulocytes: 0 %
Lymphocytes Relative: 27 %
Lymphs Abs: 1.4 K/uL (ref 0.7–4.0)
MCH: 30.8 pg (ref 26.0–34.0)
MCHC: 33.3 g/dL (ref 30.0–36.0)
MCV: 92.4 fL (ref 80.0–100.0)
Monocytes Absolute: 0.4 K/uL (ref 0.1–1.0)
Monocytes Relative: 8 %
Neutro Abs: 3 K/uL (ref 1.7–7.7)
Neutrophils Relative %: 60 %
Platelet Count: 142 K/uL — ABNORMAL LOW (ref 150–400)
RBC: 4.35 MIL/uL (ref 3.87–5.11)
RDW: 12.6 % (ref 11.5–15.5)
WBC Count: 5 K/uL (ref 4.0–10.5)
nRBC: 0 % (ref 0.0–0.2)

## 2023-09-18 LAB — FERRITIN: Ferritin: 41 ng/mL (ref 11–307)

## 2023-09-18 LAB — IRON AND TIBC
Iron: 80 ug/dL (ref 28–170)
Saturation Ratios: 23 % (ref 10.4–31.8)
TIBC: 353 ug/dL (ref 250–450)
UIBC: 273 ug/dL

## 2023-09-18 LAB — CMP (CANCER CENTER ONLY)
ALT: 21 U/L (ref 0–44)
AST: 34 U/L (ref 15–41)
Albumin: 3.8 g/dL (ref 3.5–5.0)
Alkaline Phosphatase: 46 U/L (ref 38–126)
Anion gap: 9 (ref 5–15)
BUN: 19 mg/dL (ref 8–23)
CO2: 28 mmol/L (ref 22–32)
Calcium: 8.9 mg/dL (ref 8.9–10.3)
Chloride: 104 mmol/L (ref 98–111)
Creatinine: 0.88 mg/dL (ref 0.44–1.00)
GFR, Estimated: >60 mL/min (ref >60)
Glucose, Bld: 87 mg/dL (ref 70–99)
Potassium: 4.9 mmol/L (ref 3.5–5.1)
Sodium: 141 mmol/L (ref 135–145)
Total Bilirubin: 0.6 mg/dL (ref 0.0–1.2)
Total Protein: 6.6 g/dL (ref 6.5–8.1)

## 2023-09-18 LAB — VITAMIN B12: Vitamin B-12: 788 pg/mL (ref 180–914)

## 2023-09-20 ENCOUNTER — Inpatient Hospital Stay (HOSPITAL_BASED_OUTPATIENT_CLINIC_OR_DEPARTMENT_OTHER): Admitting: Internal Medicine

## 2023-09-20 ENCOUNTER — Inpatient Hospital Stay

## 2023-09-20 ENCOUNTER — Encounter: Payer: Self-pay | Admitting: Internal Medicine

## 2023-09-20 VITALS — BP 86/57 | HR 71 | Temp 98.6°F | Resp 18 | Wt 144.5 lb

## 2023-09-20 VITALS — BP 90/51 | HR 67

## 2023-09-20 DIAGNOSIS — D508 Other iron deficiency anemias: Secondary | ICD-10-CM

## 2023-09-20 DIAGNOSIS — K912 Postsurgical malabsorption, not elsewhere classified: Secondary | ICD-10-CM | POA: Diagnosis not present

## 2023-09-20 DIAGNOSIS — D696 Thrombocytopenia, unspecified: Secondary | ICD-10-CM | POA: Diagnosis not present

## 2023-09-20 DIAGNOSIS — D509 Iron deficiency anemia, unspecified: Secondary | ICD-10-CM | POA: Diagnosis not present

## 2023-09-20 DIAGNOSIS — Z9884 Bariatric surgery status: Secondary | ICD-10-CM | POA: Diagnosis not present

## 2023-09-20 MED ORDER — IRON SUCROSE 20 MG/ML IV SOLN
200.0000 mg | Freq: Once | INTRAVENOUS | Status: AC
  Administered 2023-09-20: 200 mg via INTRAVENOUS
  Filled 2023-09-20: qty 10

## 2023-09-20 NOTE — Progress Notes (Signed)
 [facility] CONSULT NOTE  Patient Care Team: Ashley Marsa PARAS, DO as PCP - General (Family Medicine) Ashley Evalene PARAS, MD as PCP - Cardiology (Cardiology) Ashley Cindy SAUNDERS, MD as Consulting Physician (Oncology) Ashley Frederick, NP as Nurse Practitioner (Cardiology)  CHIEF COMPLAINTS/PURPOSE OF CONSULTATION: Iron  deficiency   HEMATOLOGY HISTORY  # ANEMIA IRON  DEFICIENCY GASTRIC BYPASS [2005 [place], [place]] EGD-none; colonoscopy-2015- few polyps snared [[place]]; IV venofer  x3; fall 2020/ PO B12 [intol to SL b12]    Latest Reference Range & Units 03/03/21 10:15  Iron  28 - 170 ug/dL 877  UIBC ug/dL 614  TIBC 749 - 549 ug/dL 492 (H)  Saturation Ratios 10.4 - 31.8 % 24  Ferritin 11 - 307 ng/mL 15  Vitamin B12 180 - 914 pg/mL 573  (H): Data is abnormally high  HISTORY OF PRESENTING ILLNESS: Patient ambulating-independently. Alone.   Pinkey Fields 69 y.o.  female history of iron  deficiency secondary to gastric bypass-on home B12 injection and also intermittent iron  infusions is here for follow-up.   Complains of ongoing fatigue.  She does take her B12 injections.  Otherwise no blood in stools or black or stools.  Review of Systems  Constitutional:  Positive for malaise/fatigue. Negative for chills, diaphoresis, fever and weight loss.  HENT:  Negative for nosebleeds and sore throat.   Eyes:  Negative for double vision.  Respiratory:  Negative for cough, hemoptysis, sputum production and wheezing.   Cardiovascular:  Negative for chest pain, palpitations, orthopnea and leg swelling.  Gastrointestinal:  Negative for abdominal pain, blood in stool, constipation, diarrhea, heartburn, melena, nausea and vomiting.  Genitourinary:  Negative for dysuria, frequency and urgency.  Musculoskeletal:  Negative for back pain and joint pain.  Skin: Negative.  Negative for itching and rash.  Neurological:  Negative for dizziness, tingling, focal weakness, weakness and headaches.   Endo/Heme/Allergies:  Does not bruise/bleed easily.  Psychiatric/Behavioral:  Negative for depression. The patient is not nervous/anxious and does not have insomnia.     MEDICAL HISTORY:  Past Medical History:  Diagnosis Date   Abdominal wall seroma (postoperative) 2025   Allergy    Anxiety    Aortic atherosclerosis (HCC)    Arthritis    Back pain    a.) s/p L4-L5 fusion 2008; b.) s/p PLIF L2-L3 and ALIF L5-S1 12/2021   Bilateral carotid artery disease (HCC)    Bipolar disorder (HCC)    Blood transfusion without reported diagnosis 1977   Transfusions from miscarriage & hemorrhaging   CAD (coronary artery disease)    Chronic kidney disease    DDD (degenerative disc disease), cervical    a.) s/p ACDF C4-C7   Depression    Disp fx of cuboid bone of right foot, init for clos fx 01/01/2017   Displacement of breast implant 2025   Fibromyalgia    GERD (gastroesophageal reflux disease)    Headache    Hepatitis C virus infection cured after antiviral drug therapy    a.) s/p Tx with ledipasvir/sofosbuvir   History of 2019 novel coronavirus disease (COVID-19) 01/23/2020   Hollenhorst plaque, right eye    Hyperlipidemia    Hypoglycemia    Insomnia    a.) uses trazodone  PRN   Iron  deficiency anemia following bariatric surgery    Long-term use of aspirin  therapy    Lumbar radiculopathy    Neck pain    Neuromuscular disorder (HCC) 1997   Falling to much was an eye-opener   Osteoporosis    PSVT (paroxysmal supraventricular tachycardia) (HCC)  Systolic murmur    Thyroid  disease    Urine incontinence     SURGICAL HISTORY: Past Surgical History:  Procedure Laterality Date   5 miscarriages     1977-1985, Blood transfusion s/p miscarriage 1977   ABDOMINAL EXPOSURE N/A 12/18/2021   Procedure: ABDOMINAL EXPOSURE;  Surgeon: Ashley Fields PARAS, MD;  Location: [facility] OR;  Service: Vascular;  Laterality: N/A;   ACDF C4-C7  02/10/2015   ANTERIOR LUMBAR FUSION N/A 12/18/2021    Procedure: Anterior Lumbar Interbody Fusion  - Lumbar Five-Sacral One;  Surgeon: Ashley Alm RAMAN, MD;  Location: [facility] OR;  Service: Neurosurgery;  Laterality: N/A;   APPENDECTOMY  12/2008   AUGMENTATION MAMMAPLASTY Bilateral 2015   AUGMENTATION MAMMAPLASTY Bilateral 2018   implants redone w/ placement of implants under muscle   breast lift bilateral, implants  05/18/2015   bilateral, silicon naturel   BREAST REDUCTION SURGERY Bilateral 1997   CARDIOVERSION     2025   CESAREAN SECTION  07/27/1981   Placenta Previa   COLONOSCOPY WITH PROPOFOL  N/A 06/13/2020   Procedure: COLONOSCOPY WITH PROPOFOL ;  Surgeon: Ashley Keene NOVAK, MD;  Location: [facility] ENDOSCOPY;  Service: Endoscopy;  Laterality: N/A;   COSMETIC SURGERY     Breast implants w/ lift, tummy tuck, upper & lower Blepharop   DILATION AND CURETTAGE OF UTERUS     ESOPHAGOGASTRODUODENOSCOPY (EGD) WITH PROPOFOL  N/A 06/13/2020   Procedure: ESOPHAGOGASTRODUODENOSCOPY (EGD) WITH PROPOFOL ;  Surgeon: Ashley Keene NOVAK, MD;  Location: [facility] ENDOSCOPY;  Service: Endoscopy;  Laterality: N/A;   EXCISION OF ABDOMINAL WALL TUMOR N/A 04/08/2023   Procedure: EXCISION, NEOPLASM, ABDOMINAL WALL;  Surgeon: Ashley Romano, MD;  Location: [facility] ORS;  Service: General;  Laterality: N/A;   EYE SURGERY  2017   Cataract surgery & lasik   GASTRIC BYPASS  12/24/2003   IVC FILTER INSERTION  12/2003   TrapEase Vena Cava Filter   LAPAROSCOPIC CHOLECYSTECTOMY N/A 03/2004   LEFT HEART CATH AND CORONARY ANGIOGRAPHY Left 07/02/2023   Procedure: LEFT HEART CATH AND CORONARY ANGIOGRAPHY;  Surgeon: Ashley Lonni, MD;  Location: [facility] INVASIVE CV LAB;  Service: Cardiovascular;  Laterality: Left;   LUMBAR LAMINECTOMY  06/2006   L4-L5 (spinal fusion)   mini tummy tuck  05/07/2016   Bilateral bra/back roll lift skin removal   REDUCTION MAMMAPLASTY Bilateral 1997   SHOULDER ACROMIOPLASTY Left 03/27/2013   w/ labral debridement   SPINAL CORD STIMULATOR IMPLANT   11/2010   removed 05/2011   TOTAL ABDOMINAL HYSTERECTOMY N/A 1987    SOCIAL HISTORY: Social History   Socioeconomic History   Marital status: Married    Spouse name: Ashley Fields (Spouse) [phone] ([place])   Number of children: 1   Years of education: High School   Highest education level: Some college, no degree  Occupational History   Occupation: disability  Tobacco Use   Smoking status: Former    Current packs/day: 0.00    Average packs/day: 0.3 packs/day for 10.0 years (2.5 ttl pk-yrs)    Types: Cigarettes    Start date: 11/28/1985    Quit date: 11/29/1995    Years since quitting: 27.8    Passive exposure: Past   Smokeless tobacco: Former   Tobacco comments:    Quite 1997, didnt smoke hardly at all when I was smoking.  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]   Social Drivers of Corporate investment banker Strain: Low Risk  (08/25/2023)   Overall Financial Resource Strain (CARDIA)    Difficulty of Paying Living Expenses: Not very hard  Food Insecurity: Food Insecurity Present (08/25/2023)   Hunger Vital Sign    Worried About Running Out of Food in the Last Year: Sometimes true    Ran Out of Food in the Last Year: Sometimes true  Transportation Needs: No Transportation Needs (08/25/2023)   PRAPARE - Administrator, Civil Service (Medical): No    Lack of Transportation (Non-Medical): No  Physical Activity: Sufficiently Active (08/25/2023)   Exercise Vital Sign    Days of Exercise per Week: 7 days    Minutes of Exercise per Session: 30 min  Stress: No Stress Concern Present (08/25/2023)   [employer] of Occupational Health - Occupational Stress Questionnaire    Feeling of Stress: Only a little  Recent Concern: Stress - Stress Concern Present (06/19/2023)   [employer] of Occupational Health - Occupational Stress Questionnaire    Feeling of Stress : Very much  Social Connections: Moderately Isolated (08/25/2023)   Social Connection and Isolation Panel    Frequency of Communication with Friends and Family: Three times a week    Frequency of Social Gatherings with Friends and Family: Once a week    Attends Religious Services: Never    Database administrator or Organizations: No    Attends Engineer, structural: Not on file    Marital Status: Married  Catering manager Violence: Not At Risk (06/19/2023)   Humiliation, Afraid, Rape, and Kick questionnaire    Fear of Current or Ex-Partner: No    Emotionally Abused: No    Physically Abused: No    Sexually Abused: No    FAMILY HISTORY: Family History  Problem Relation Age of Onset   COPD Mother    Lung cancer Mother 33   Diabetes Mother    Heart disease Mother    Stroke Mother    Heart attack Mother    Arthritis Mother    Hypertension Mother    Obesity Mother    Varicose Veins Mother    Heart disease Father    Heart attack Father 62   Early death Father    Depression Sister        x 5 sisters   COPD Sister    Hypertension Sister    Colon polyps Sister    Hearing loss Sister    Heart attack Sister    Arthritis Sister    Varicose Veins Sister    COPD Sister    Obesity Sister    Depression Sister    Depression Sister    Heart disease Sister    Hypertension Sister    Hypertension Sister    Obesity Sister    Heart disease Brother        x 3 brothers   Diabetes Brother    Heart attack Brother    Arthritis Brother    Heart disease Brother    Hypertension Brother    Diabetes Brother    Heart disease Brother    Hypertension Brother    Obesity Brother    Varicose Veins Brother    Heart disease  Brother    Hypertension Brother  Obesity Brother    Miscarriages / Stillbirths Maternal Aunt    Miscarriages / Stillbirths Paternal Aunt    Breast cancer Paternal Aunt        early years   Cancer Maternal Grandfather    Stroke Maternal Grandfather     ALLERGIES:  is allergic to flagyl [metronidazole], cymbalta [duloxetine hcl], and tape.  MEDICATIONS:  Current Outpatient Medications  Medication Sig Dispense Refill   aspirin  EC (ASPIRIN  LOW DOSE) 81 MG tablet TAKE 1 TABLET BY MOUTH EVERY DAY 90 tablet 0   Azelastine  HCl 137 MCG/SPRAY SOLN PLACE 2 SPRAYS INTO BOTH NOSTRILS 2 (TWO) TIMES DAILY. USE IN EACH NOSTRIL AS DIRECTED 48 mL 1   bisacodyl  (DULCOLAX) 5 MG EC tablet Take 5 mg by mouth daily as needed for moderate constipation.     butalbital -acetaminophen -caffeine  (FIORICET) 50-325-40 MG tablet Take 1 tablet by mouth 2 (two) times daily as needed for headache.     CALCIUM  PO Take 1 tablet by mouth 2 (two) times daily. Mini     Cholecalciferol 250 MCG (10000 UT) CAPS Take 10,000 Units by mouth once a week.     cyanocobalamin  (VITAMIN B12) 1000 MCG/ML injection INJECT 1 ML (1,000 MCG TOTAL) INTO THE MUSCLE EVERY 30 DAYS. 3 mL 0   diclofenac Sodium (VOLTAREN) 1 % GEL Apply 2 g topically 4 (four) times daily as needed (pain).     estradiol  (ESTRACE ) 1 MG tablet TAKE 1 TABLET BY MOUTH EVERY DAY 90 tablet 1   etodolac  (LODINE ) 500 MG tablet Take 1 tablet by mouth 2 (two) times daily.     Evolocumab  (REPATHA  SURECLICK) 140 MG/ML SOAJ Inject 140 mg into the skin every 14 (fourteen) days. 6 mL 3   ezetimibe  (ZETIA ) 10 MG tablet TAKE 1 TABLET BY MOUTH EVERY DAY 90 tablet 3   fexofenadine  (ALLEGRA ) 180 MG tablet Take 1 tablet (180 mg total) by mouth daily.     fluticasone  (FLONASE ) 50 MCG/ACT nasal spray SPRAY 2 SPRAYS INTO EACH NOSTRIL EVERY DAY 48 mL 0   gabapentin  (NEURONTIN ) 600 MG tablet Take 1 tablet (600 mg total) by mouth in the morning, at noon, in the evening, and at bedtime. 360  tablet 3   hydrOXYzine  (ATARAX ) 25 MG tablet Take 1 tablet (25 mg total) by mouth 3 (three) times daily as needed for anxiety. 270 tablet 0   Multiple Vitamins-Minerals (MULTIVITAMIN WITH MINERALS) tablet Take 1 tablet by mouth daily.     omeprazole  (PRILOSEC) 40 MG capsule Take 1 capsule (40 mg total) by mouth daily. 90 capsule 3   ondansetron  (ZOFRAN -ODT) 4 MG disintegrating tablet TAKE 1 TABLET BY MOUTH EVERY 8 HOURS AS NEEDED FOR NAUSEA AND VOMITING 30 tablet 2   Oxcarbazepine  (TRILEPTAL ) 300 MG tablet TAKE 2.5 TABS BY MOUTH AS DIRECTED. TAKE 1 TABLET DAILY AT 8 AM AND HALF TABLET AT NOON AND 1 TABLET DAILY AT 8 PM 75 tablet 2   oxyCODONE -acetaminophen  (PERCOCET) 7.5-325 MG tablet Take 1 tablet by mouth every 6 (six) hours.     RESTASIS 0.05 % ophthalmic emulsion Place 1 drop into both eyes 2 (two) times daily.     rizatriptan  (MAXALT -MLT) 10 MG disintegrating tablet Take 1 tablet (10 mg total) by mouth as needed for migraine. May repeat in 2 hours if needed, that is max dose for 24 hours. 10 tablet 3   rosuvastatin  (CRESTOR ) 40 MG tablet TAKE 1 TABLET BY MOUTH EVERY DAY 90 tablet 3   tiZANidine  (ZANAFLEX ) 4 MG tablet Take  1 tablet (4 mg total) by mouth 4 (four) times daily. 120 tablet 5   traZODone  (DESYREL ) 100 MG tablet Take 1-2 tablets (100-200 mg total) by mouth at bedtime as needed for sleep. Has supplies     triamcinolone  ointment (KENALOG ) 0.5 % Apply 1 Application topically 2 (two) times daily. Apply to external ear for 2 weeks 30 g 2   valACYclovir  (VALTREX ) 1000 MG tablet TAKE 1 TABLET (1,000 MG TOTAL) BY MOUTH DAILY. FOR 5-7 DAYS, MAY REPEAT IF NEED FOR COLD SORE 90 tablet 1   venlafaxine  XR (EFFEXOR  XR) 150 MG 24 hr capsule Take 1 capsule (150 mg total) by mouth daily with breakfast. DOSE INCREASE 90 capsule 0   Zoledronic  Acid (ZOMETA ) 4 MG/100ML IVPB Once per year from Orthopedic 100 mL    furosemide  (LASIX ) 20 MG tablet TAKE 1 TABLET ONE TIME DAILY AS NEEDED FOR FLUID OR EDEMA  (Patient not taking: Reported on 09/20/2023) 90 tablet 1   No current facility-administered medications for this visit.   Facility-Administered Medications Ordered in Other Visits  Medication Dose Route Frequency Provider Last Rate Last Admin   iron  sucrose (VENOFER ) injection 200 mg  200 mg Intravenous Once [name] R, MD          PHYSICAL EXAMINATION:   Vitals:   09/20/23 1305  BP: (!) 86/57  Pulse: 71  Resp: 18  Temp: 98.6 F (37 C)  SpO2: 99%   Filed Weights   09/20/23 1305  Weight: 144 lb 8 oz (65.5 kg)   3 to 4 cm mass noted left of the umbilicus.  No significant tenderness.   Physical Exam HENT:     Head: Normocephalic and atraumatic.     Mouth/Throat:     Pharynx: No oropharyngeal exudate.  Eyes:     Pupils: Pupils are equal, round, and reactive to light.  Cardiovascular:     Rate and Rhythm: Normal rate and regular rhythm.  Pulmonary:     Effort: Pulmonary effort is normal. No respiratory distress.     Breath sounds: Normal breath sounds. No wheezing.  Abdominal:     General: Bowel sounds are normal. There is no distension.     Palpations: Abdomen is soft. There is no mass.     Tenderness: There is no abdominal tenderness. There is no guarding or rebound.  Musculoskeletal:        General: No tenderness. Normal range of motion.     Cervical back: Normal range of motion and neck supple.  Skin:    General: Skin is warm.  Neurological:     Mental Status: She is alert and oriented to person, place, and time.  Psychiatric:        Mood and Affect: Affect normal.     LABORATORY DATA:  I have reviewed the data as listed Lab Results  Component Value Date   WBC 5.0 09/18/2023   HGB 13.4 09/18/2023   HCT 40.2 09/18/2023   MCV 92.4 09/18/2023   PLT 142 (L) 09/18/2023   Recent Labs    03/19/23 0955 04/24/23 0756 06/25/23 0930 08/25/23 1516 08/27/23 0908 09/18/23 0938  NA 137 142   < > 137 142 141  K 4.5 4.3   < > 4.6 5.2 4.9  CL 102  105   < > 101 102 104  CO2 26 31   < > 28 25 28   GLUCOSE 100* 90   < > 99 78 87  BUN 15 13   < >  23 10 19   CREATININE 0.98 0.67   < > 1.15* 0.79 0.88  CALCIUM  8.6* 8.8   < > 8.8* 9.2 8.9  GFRNONAA >60  --   --  52*  --  >60  PROT  --  6.1  --  6.6  --  6.6  ALBUMIN  --   --   --  3.7  --  3.8  AST  --  33  --  38  --  34  ALT  --  26  --  30  --  21  ALKPHOS  --   --   --  41  --  46  BILITOT  --  0.6  --  0.8  --  0.6   < > = values in this interval not displayed.     MM 3D DIAGNOSTIC MAMMOGRAM BILATERAL BREAST W/IMPLANT Result Date: 09/18/2023 CLINICAL DATA:  Focal LATERAL LEFT breast pain, concern for implant shift. Due for annual. EXAM: DIGITAL DIAGNOSTIC BILATERAL MAMMOGRAM WITH IMPLANTS, TOMOSYNTHESIS AND CAD; ULTRASOUND LEFT BREAST LIMITED TECHNIQUE: Bilateral digital diagnostic mammography and breast tomosynthesis was performed. Standard and/or implant displaced views were performed. The images were evaluated with computer-aided detection. ; Targeted ultrasound examination of the left breast was performed. COMPARISON:  Previous exam(s). ACR Breast Density Category b: There are scattered areas of fibroglandular density. FINDINGS: The patient has retropectoral implants. Spot compression tomosynthesis views were obtained over the area of focal pain in the LATERAL LEFT breast. No suspicious mammographic finding is identified in this area. A questioned asymmetry in the MEDIAL RIGHT breast reflects tortuous blood vessels on spot compression view. No suspicious mass, microcalcification, or other finding is identified in EITHER breast. Targeted LEFT breast ultrasound was performed in the area of pain at 2-3 o'clock in the LEFT breast. No suspicious solid or cystic mass is identified. The underlying implant appears intact in this area. Representative negative pictures were taken. IMPRESSION: No mammographic or sonographic etiology for focal LEFTbreast painidentified. There is no mammographic  evidence of malignancy in either breast. Breast pain is a common condition, which will often resolve on its own without intervention. It can be affected by hormonal changes, medication side effect, weight changes and fit of the bra. Pain may also be referred from other adjacent areas of the body. Breast pain may be improved by wearing adequate well-fitting support, over-the-counter topical and oral NSAID medication, low-fat diet, and ice/heat as needed. Studies have shown an improvement in cyclic pain with use of evening primrose oil, vitamin D  and vitamin E. Clinical follow-up recommended to discuss any further work-up recommendations and appropriate treatment, which should be based on the clinical assessment. Findings and recommendations were discussed with the patient in person. RECOMMENDATION: Clinical follow-up of LEFT breast pain. Although today's examination was normal, if there are continued clinical concerns for implant integrity, MRI without contrast (implant protocol) could be considered. Patient may return to routine screening mammogram in 1 year.(Code:SM-B-01Y) I have discussed the findings and recommendations with the patient. If applicable, a reminder letter will be sent to the patient regarding the next appointment. BI-RADS CATEGORY  1: Negative. Electronically Signed   By: Norleen Croak M.D.   On: 09/18/2023 09:01   US  LIMITED ULTRASOUND INCLUDING AXILLA LEFT BREAST  Result Date: 09/18/2023 CLINICAL DATA:  Focal LATERAL LEFT breast pain, concern for implant shift. Due for annual. EXAM: DIGITAL DIAGNOSTIC BILATERAL MAMMOGRAM WITH IMPLANTS, TOMOSYNTHESIS AND CAD; ULTRASOUND LEFT BREAST LIMITED TECHNIQUE: Bilateral digital diagnostic mammography and breast tomosynthesis was  performed. Standard and/or implant displaced views were performed. The images were evaluated with computer-aided detection. ; Targeted ultrasound examination of the left breast was performed. COMPARISON:  Previous exam(s). ACR  Breast Density Category b: There are scattered areas of fibroglandular density. FINDINGS: The patient has retropectoral implants. Spot compression tomosynthesis views were obtained over the area of focal pain in the LATERAL LEFT breast. No suspicious mammographic finding is identified in this area. A questioned asymmetry in the MEDIAL RIGHT breast reflects tortuous blood vessels on spot compression view. No suspicious mass, microcalcification, or other finding is identified in EITHER breast. Targeted LEFT breast ultrasound was performed in the area of pain at 2-3 o'clock in the LEFT breast. No suspicious solid or cystic mass is identified. The underlying implant appears intact in this area. Representative negative pictures were taken. IMPRESSION: No mammographic or sonographic etiology for focal LEFTbreast painidentified. There is no mammographic evidence of malignancy in either breast. Breast pain is a common condition, which will often resolve on its own without intervention. It can be affected by hormonal changes, medication side effect, weight changes and fit of the bra. Pain may also be referred from other adjacent areas of the body. Breast pain may be improved by wearing adequate well-fitting support, over-the-counter topical and oral NSAID medication, low-fat diet, and ice/heat as needed. Studies have shown an improvement in cyclic pain with use of evening primrose oil, vitamin D  and vitamin E. Clinical follow-up recommended to discuss any further work-up recommendations and appropriate treatment, which should be based on the clinical assessment. Findings and recommendations were discussed with the patient in person. RECOMMENDATION: Clinical follow-up of LEFT breast pain. Although today's examination was normal, if there are continued clinical concerns for implant integrity, MRI without contrast (implant protocol) could be considered. Patient may return to routine screening mammogram in 1 year.(Code:SM-B-01Y)  I have discussed the findings and recommendations with the patient. If applicable, a reminder letter will be sent to the patient regarding the next appointment. BI-RADS CATEGORY  1: Negative. Electronically Signed   By: Norleen Croak M.D.   On: 09/18/2023 09:01   DG Ribs Unilateral W/Chest Left Result Date: 09/05/2023 CLINICAL DATA:  Chest pain EXAM: LEFT RIBS AND CHEST - 3+ VIEW COMPARISON:  August 25, 2023 FINDINGS: Chronically elevated right hemidiaphragm. No focal airspace consolidation, pleural effusion, or pneumothorax. No cardiomegaly. Aortic atherosclerosis. No acute, displaced rib fracture or destructive lesion. Cervical and lumbar fusion hardware. Multilevel degenerative disc disease of the spine. IVC filter. IMPRESSION: No acute, displaced rib fracture. Otherwise, clear lungs. Electronically Signed   By: Rogelia Myers M.D.   On: 09/05/2023 15:41   DG Chest 1 View Result Date: 08/25/2023 CLINICAL DATA:  Syncope. EXAM: CHEST  1 VIEW COMPARISON:  09/12/2021 FINDINGS: Chronic elevation of right hemidiaphragm.The cardiomediastinal contours are normal. Aortic atherosclerosis. Pulmonary vasculature is normal. No consolidation, pleural effusion, or pneumothorax. No acute osseous abnormalities are seen. IMPRESSION: No acute chest findings. Chronic elevation of right hemidiaphragm. Electronically Signed   By: Andrea Gasman M.D.   On: 08/25/2023 16:18   CT HEAD WO CONTRAST Result Date: 08/25/2023 CLINICAL DATA:  Head and neck trauma. Syncopal episode with head strike. Dizziness and hypotension. EXAM: CT HEAD WITHOUT CONTRAST CT CERVICAL SPINE WITHOUT CONTRAST TECHNIQUE: Multidetector CT imaging of the head and cervical spine was performed following the standard protocol without intravenous contrast. Multiplanar CT image reconstructions of the cervical spine were also generated. RADIATION DOSE REDUCTION: This exam was performed according to the departmental dose-optimization program  which includes  automated exposure control, adjustment of the mA and/or kV according to patient size and/or use of iterative reconstruction technique. COMPARISON:  Head CT 03/11/2021 FINDINGS: CT HEAD FINDINGS Brain: There is no evidence of an acute infarct, intracranial hemorrhage, mass, midline shift, or extra-axial fluid collection. Cerebral volume is normal. The ventricles are normal in size. Vascular: No hyperdense vessel. Skull: No acute fracture or suspicious lesion. Sinuses/Orbits: Visualized paranasal sinuses and mastoid air cells are clear. Bilateral cataract extraction. Other: None. CT CERVICAL SPINE FINDINGS Alignment: Straightening of the normal cervical lordosis. No significant listhesis. Skull base and vertebrae: No acute fracture or suspicious lesion. Moderate atlantal dental degenerative changes. Soft tissues and spinal canal: No prevertebral fluid or swelling. No visible canal hematoma. Disc levels: C4-C7 ACDF with solid arthrodesis. Disc degeneration at C3-4 with prominent anterior vertebral osteophytes, left greater than right uncovertebral spurring, and severe left facet arthrosis likely resulting in mild spinal stenosis and mild-to-moderate left neural foraminal stenosis. Upper chest: Clear lung apices. Other: Mild atherosclerotic calcification at the carotid bifurcations. IMPRESSION: 1. No evidence of acute intracranial abnormality. 2. No acute cervical spine fracture. 3. C4-C7 ACDF. Electronically Signed   By: Dasie Hamburg M.D.   On: 08/25/2023 16:16   CT Cervical Spine Wo Contrast Result Date: 08/25/2023 CLINICAL DATA:  Head and neck trauma. Syncopal episode with head strike. Dizziness and hypotension. EXAM: CT HEAD WITHOUT CONTRAST CT CERVICAL SPINE WITHOUT CONTRAST TECHNIQUE: Multidetector CT imaging of the head and cervical spine was performed following the standard protocol without intravenous contrast. Multiplanar CT image reconstructions of the cervical spine were also generated. RADIATION DOSE  REDUCTION: This exam was performed according to the departmental dose-optimization program which includes automated exposure control, adjustment of the mA and/or kV according to patient size and/or use of iterative reconstruction technique. COMPARISON:  Head CT 03/11/2021 FINDINGS: CT HEAD FINDINGS Brain: There is no evidence of an acute infarct, intracranial hemorrhage, mass, midline shift, or extra-axial fluid collection. Cerebral volume is normal. The ventricles are normal in size. Vascular: No hyperdense vessel. Skull: No acute fracture or suspicious lesion. Sinuses/Orbits: Visualized paranasal sinuses and mastoid air cells are clear. Bilateral cataract extraction. Other: None. CT CERVICAL SPINE FINDINGS Alignment: Straightening of the normal cervical lordosis. No significant listhesis. Skull base and vertebrae: No acute fracture or suspicious lesion. Moderate atlantal dental degenerative changes. Soft tissues and spinal canal: No prevertebral fluid or swelling. No visible canal hematoma. Disc levels: C4-C7 ACDF with solid arthrodesis. Disc degeneration at C3-4 with prominent anterior vertebral osteophytes, left greater than right uncovertebral spurring, and severe left facet arthrosis likely resulting in mild spinal stenosis and mild-to-moderate left neural foraminal stenosis. Upper chest: Clear lung apices. Other: Mild atherosclerotic calcification at the carotid bifurcations. IMPRESSION: 1. No evidence of acute intracranial abnormality. 2. No acute cervical spine fracture. 3. C4-C7 ACDF. Electronically Signed   By: Dasie Hamburg M.D.   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  Currently on maintenance IV venofer -  HOLD off venofer - I sat 20%.    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 .   # DISPOSITION:  # HOLD venofer  #   follow up  in 6 months  MD; 2-3 days prior- labs- cbc/cmp; iron  studies; ferritin; B12- Dr.B     All questions were answered. The patient knows to call the clinic with any problems,  questions or concerns.    Cindy JONELLE Joe, MD 09/20/2023 1:46 PM

## 2023-09-20 NOTE — Assessment & Plan Note (Addendum)
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  Currently on maintenance IV venofer - ok  venofer .   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 .   # DISPOSITION:  # proceed with venofer  #  follow up  in 6 months  MD; 2-3 days prior- labs- cbc/cmp; iron  studies; ferritin; B12; possible venofer - - Dr.B

## 2023-09-20 NOTE — Progress Notes (Signed)
 Patient has no concerns

## 2023-09-20 NOTE — Patient Instructions (Signed)

## 2023-09-22 ENCOUNTER — Other Ambulatory Visit: Payer: Self-pay | Admitting: Psychiatry

## 2023-09-22 DIAGNOSIS — F410 Panic disorder [episodic paroxysmal anxiety] without agoraphobia: Secondary | ICD-10-CM

## 2023-09-22 DIAGNOSIS — F4310 Post-traumatic stress disorder, unspecified: Secondary | ICD-10-CM

## 2023-09-26 ENCOUNTER — Ambulatory Visit (HOSPITAL_COMMUNITY)
Admission: RE | Admit: 2023-09-26 | Discharge: 2023-09-26 | Disposition: A | Discharge status: Home / Self Care | Source: Ambulatory Visit | Attending: Cardiology | Admitting: Cardiology

## 2023-09-26 DIAGNOSIS — R55 Syncope and collapse: Secondary | ICD-10-CM | POA: Insufficient documentation

## 2023-09-30 DIAGNOSIS — R55 Syncope and collapse: Secondary | ICD-10-CM | POA: Diagnosis not present

## 2023-10-01 NOTE — Progress Notes (Signed)
 Average heart rate of 76 bpm, 1 run of elevated heart rate of 152 bpm that lasted for 4 beats, isolated early beats, triggered events associated with normal sinus rhythm, no atrial fibrillation or atrial flutter, arrhythmia, or pauses.  Overall reassuring study without significant findings.

## 2023-10-02 DIAGNOSIS — M4807 Spinal stenosis, lumbosacral region: Secondary | ICD-10-CM | POA: Diagnosis not present

## 2023-10-02 DIAGNOSIS — Z9884 Bariatric surgery status: Secondary | ICD-10-CM | POA: Diagnosis not present

## 2023-10-02 DIAGNOSIS — D508 Other iron deficiency anemias: Secondary | ICD-10-CM | POA: Diagnosis not present

## 2023-10-02 DIAGNOSIS — M961 Postlaminectomy syndrome, not elsewhere classified: Secondary | ICD-10-CM | POA: Diagnosis not present

## 2023-10-02 DIAGNOSIS — M818 Other osteoporosis without current pathological fracture: Secondary | ICD-10-CM | POA: Diagnosis not present

## 2023-10-02 DIAGNOSIS — M533 Sacrococcygeal disorders, not elsewhere classified: Secondary | ICD-10-CM | POA: Diagnosis not present

## 2023-10-03 ENCOUNTER — Encounter

## 2023-10-05 ENCOUNTER — Other Ambulatory Visit: Payer: Self-pay | Admitting: Family Medicine

## 2023-10-05 DIAGNOSIS — H60543 Acute eczematoid otitis externa, bilateral: Secondary | ICD-10-CM

## 2023-10-07 NOTE — Progress Notes (Deleted)
 Cardiology Office Note   Date:  10/07/2023  ID:  Ashley Fields, DOB 1955/11/05, MRN [id number] PCP: Edman Marsa PARAS, DO  [facility] Providers Cardiologist:  Evalene Lunger, MD Cardiology APP:  Gerard Frederick, NP { Click to update primary MD,subspecialty MD or APP then REFRESH:1}    History of Present Illness Ashley Fields is a 68 y.o. female with a past medical history of coronary calcification on CT, cirrhosis, bipolar disorder, prior tobacco abuse, hyperlipidemia, gastric bypass, Hollenhorst plaque (right eye), mild carotid artery disease, arthritis, depression, gastroesophageal reflux disease, hepatitis C, aortic atherosclerosis, who presents today for follow-up.   Prior history of coronary calcification on CT scan with coronary calcium  score 2456, 99 percentile.  Since medical control, negative stress testing in 2019 and August 2022.  Echocardiogram completed 02/2022 with an LVEF 60 to 60%, no RWMA, mild LVH, and no evidence of valvular heart disease.  ZIO monitor completed 03/09/2022 revealed occasional paroxysmal SVT, no significant sustained arrhythmia.   She was last seen in clinic 03/28/2023 states that she had been having labile blood pressures.  Blood pressure relatively low in the mornings she wakes up with very high in the afternoon.  She denies chest pain, shortness of breath but does have occasional peripheral edema.  She notes that the bouts of low blood pressure she had dizziness and lightheadedness but denies any syncope or near syncopal episodes.  She also had an upcoming surgical procedure.  She was scheduled for a cardiac PET stress for surgical clearance which was denied by her insurance and she was changed to a Lexiscan  Myoview .   She was seen in clinic 06/24/2021 and been doing well.  Continues to suffer from labile blood pressures.  He previously undergone Lexiscan  MPI which unfortunately revealed abnormal probably primary ischemia.  With abnormal results she  underwent a left heart catheterization via catheterization for mild to moderate nonobstructive coronary disease with heavy mural calcification, normal left ventricular systolic function with LVEF 55-85% with mildly elevated filling pressure LVEDP 20 mmHg.  Continue medical therapy and risk factor modification to prevent progression of coronary artery disease was recommended.  She was evaluated in clinic 07/22/2023 where she had been doing fairly well since her heart catheterization.  She was sent for follow-up labs after having her procedure.  There were no other medication changes that were made and further testing that was ordered at that time.  She presented to the [facility] emergency department on 08/25/2023 of with hypotension and loss of consciousness.  She denied any chest pain or shortness of breath.  Had recently started on Repatha  and Toprol -XL.  Patient stated that she was taking her blood pressure today and noted it was low felt dizzy and lightheaded and passed out.  She states that her blood pressures were typically in the systolic 120s and systolic was 68.  Stated that she had previously been prescribed metoprolol  but had not taken it due to reading about blood pressure side effects.  EMS found her hypotensive with systolic in the 60s and she was given 1 L normal saline bolus.  Blood pressure was found to be 68/51, pulse of 65, respiration of 20, temperature 97.5.  Labs are reviewed and show a mild AKI, high-sensitivity troponin negative x 2, serum creatinine 1.15 of up from her baseline of 0.7.  GFR was down to 52.  She was diagnosed with dehydration CT of the cervical spine/CT with no evidence of acute intracranial abnormality no evidence of cervical spine fracture.  She was  considered stable for discharge and was able to be discharged home.    She was last seen in clinic 08/27/2023 after her recent emergency department visit where she had suffered from syncopal episode from dehydration.  Blood  pressure was noted to be in the 60s.  She was scheduled for a repeat carotid duplex and ZIO XT monitoring.  She returns to clinic today     ROS: 10 point review of system has been reviewed and considered negative with the exception was listed in the HPI  Studies Reviewed     Carotid Duplex 09/26/2023 Summary:  Right Carotid: Velocities in the right ICA are consistent with a 1-39%  stenosis.   Left Carotid: Velocities in the left ICA are consistent with a 1-39%  stenosis.   Vertebrals: Bilateral vertebral arteries demonstrate antegrade flow.  Subclavians: Normal flow hemodynamics were seen in bilateral subclavian               arteries.   Event Monitor (Zio) 09/16/2023 Normal sinus rhythm Patient had a min HR of 57 bpm, max HR of 152 bpm, and avg HR of 76 bpm.    1 run of Supraventricular Tachycardia occurred lasting 4 beats with a max rate of 152 bpm (avg 126 bpm). Isolated SVEs were rare (<1.0%), SVE Couplets were rare (<1.0%), and no SVE Triplets were present.    Isolated VEs were rare (<1.0%), and no VE Couplets or VE Triplets were present. Inverted QRS complexes possibly due to inverted placement of device.   Patient triggered events (18) associated with normal sinus rhythm   LHC 07/02/2023 Conclusions: Mild to moderate, non-obstructive coronary artery disease with heavy mural calcification, as detailed below. Normal left ventricular systolic function (LVEF 55-65%) with mildly elevated filling pressure (LVEDP 20 mmHg).   Recommendations: Continue medical therapy and risk factor modification to prevent progression of coronary artery disease.   Lexiscan  MPI 06/21/2023   Abnormal, probably low risk pharmacologic myocardial perfusion stress test.   There is a moderate in size, mild in severity, partially reversible defect.  The apical septal and apical defects are fixed and most likely represent artifact.  The apical anterior defect is reversible and suspicious for ischemia.    Left ventricular systolic function is normal (LVEF > 65%).   Coronary artery calcification is noted on the attenuation correction CT.   Sensitivity and specificity of the study are degraded by attenuation from breast implants and extracardiac activity.   Compared to the prior study from 08/08/2020, the apical anterior reversible defect appears new   Event Monitor (Zio) 03/09/2022 Conclusion Occasional paroxysmal SVT. No significant sustained arrhythmias. Paroxysmal SVT coincided with patient triggered events.   2D echo 02/28/2022 1. Left ventricular ejection fraction, by estimation, is 60 to 65%. The  left ventricle has normal function. The left ventricle has no regional  wall motion abnormalities. There is mild left ventricular hypertrophy.  Left ventricular diastolic function  could not be evaluated.   2. Right ventricular systolic function is normal. The right ventricular  size is normal.   3. The mitral valve is normal in structure. No evidence of mitral valve  regurgitation.   4. The aortic valve was not well visualized. Aortic valve regurgitation  is not visualized.   5. The inferior vena cava is normal in size with greater than 50%  respiratory variability, suggesting right atrial pressure of 3 mmHg.    Myoview  Lexicsan 08/2020 Narrative & Impression  There was no ST segment deviation noted during stress. No T  wave inversion was noted during stress. Defect 1: There is a small defect of mild severity present in the apex location likely artifact. The study is normal. This is a low risk study. The left ventricular ejection fraction is normal (55-65%). CT attenuation images shows moderate aortic and coronary calcifications.     Myoview  lexiscan  2019 Narrative & Impression  Pharmacological myocardial perfusion imaging study with no significant  ischemia Normal wall motion, EF estimated at 83% No EKG changes concerning for ischemia at peak stress or in recovery. Low risk scan     Cardiac CT scoring 2019 IMPRESSION: Coronary calcium  score of 2456 . This was 53 th percentile for age and sex matched control.   Consider f/u heart catheterization or perfusion study given how extensive the coronary calcification is  Risk Assessment/Calculations   No BP recorded.  {Refresh Note OR Click here to enter BP  :1}***       Physical Exam VS:  There were no vitals taken for this visit.       Wt Readings from Last 3 Encounters:  09/20/23 144 lb 8 oz (65.5 kg)  09/05/23 147 lb 2 oz (66.7 kg)  08/29/23 147 lb 4 oz (66.8 kg)    GEN: Well nourished, well developed in no acute distress NECK: No JVD; No carotid bruits CARDIAC: ***RRR, II/VI systolic murmur, rubs, gallops RESPIRATORY:  Clear to auscultation without rales, wheezing or rhonchi  ABDOMEN: Soft, non-tender, non-distended EXTREMITIES:  No edema; No deformity   ASSESSMENT AND PLAN ***    {Are you ordering a CV Procedure (e.g. stress test, cath, DCCV, TEE, etc)?   Press F2        :[id number]}  Dispo: ***  Signed, Johnedward Brodrick, NP

## 2023-10-08 ENCOUNTER — Ambulatory Visit: Attending: Cardiology | Admitting: Cardiology

## 2023-10-08 ENCOUNTER — Encounter: Payer: Self-pay | Admitting: Cardiology

## 2023-10-08 ENCOUNTER — Ambulatory Visit: Admitting: Cardiology

## 2023-10-08 VITALS — BP 130/79 | HR 82 | Ht 62.0 in | Wt 147.0 lb

## 2023-10-08 DIAGNOSIS — E782 Mixed hyperlipidemia: Secondary | ICD-10-CM

## 2023-10-08 DIAGNOSIS — R0989 Other specified symptoms and signs involving the circulatory and respiratory systems: Secondary | ICD-10-CM

## 2023-10-08 DIAGNOSIS — R011 Cardiac murmur, unspecified: Secondary | ICD-10-CM

## 2023-10-08 DIAGNOSIS — Z87898 Personal history of other specified conditions: Secondary | ICD-10-CM | POA: Diagnosis not present

## 2023-10-08 DIAGNOSIS — I2510 Atherosclerotic heart disease of native coronary artery without angina pectoris: Secondary | ICD-10-CM

## 2023-10-08 DIAGNOSIS — H34211 Partial retinal artery occlusion, right eye: Secondary | ICD-10-CM

## 2023-10-08 NOTE — Progress Notes (Signed)
 Cardiology Office Note   Date:  10/08/2023  ID:  Ashley Fields, Ashley Fields Nov 21, 1955, MRN [id number] PCP: Edman Marsa PARAS, DO  [facility] Providers Cardiologist:  Evalene Lunger, MD Cardiology APP:  Gerard Frederick, NP     History of Present Illness Ashley Fields is a 68 y.o. female with a past medical history of coronary calcification on CT, cirrhosis, bipolar disorder, prior tobacco abuse, hyperlipidemia, gastric bypass, Hollenhorst plaque (right eye), mild carotid artery disease, arthritis, depression, gastroesophageal reflux disease, hepatitis C, aortic atherosclerosis, who presents today for follow-up.   Prior history of coronary calcification on CT scan with coronary calcium  score 2456, 99 percentile.  Since medical control, negative stress testing in 2019 and August 2022.  Echocardiogram completed 02/2022 with an LVEF 60 to 60%, no RWMA, mild LVH, and no evidence of valvular heart disease.  ZIO monitor completed 03/09/2022 revealed occasional paroxysmal SVT, no significant sustained arrhythmia.   She was last seen in clinic 03/28/2023 states that she had been having labile blood pressures.  Blood pressure relatively low in the mornings she wakes up with very high in the afternoon.  She denies chest pain, shortness of breath but does have occasional peripheral edema.  She notes that the bouts of low blood pressure she had dizziness and lightheadedness but denies any syncope or near syncopal episodes.  She also had an upcoming surgical procedure.  She was scheduled for a cardiac PET stress for surgical clearance which was denied by her insurance and she was changed to a Lexiscan  Myoview .   She was seen in clinic 06/24/2021 and been doing well.  Continues to suffer from labile blood pressures.  He previously undergone Lexiscan  MPI which unfortunately revealed abnormal probably primary ischemia.  With abnormal results she underwent a left heart catheterization via catheterization for mild to  moderate nonobstructive coronary disease with heavy mural calcification, normal left ventricular systolic function with LVEF 55-85% with mildly elevated filling pressure LVEDP 20 mmHg.  Continue medical therapy and risk factor modification to prevent progression of coronary artery disease was recommended.  She was evaluated in clinic 07/22/2023 where she had been doing fairly well since her heart catheterization.  She was sent for follow-up labs after having her procedure.  There were no other medication changes that were made and further testing that was ordered at that time.  She presented to the [facility] emergency department on 08/25/2023 of with hypotension and loss of consciousness.  She denied any chest pain or shortness of breath.  Had recently started on Repatha  and Toprol -XL.  Patient stated that she was taking her blood pressure today and noted it was low felt dizzy and lightheaded and passed out.  She states that her blood pressures were typically in the systolic 120s and systolic was 68.  Stated that she had previously been prescribed metoprolol  but had not taken it due to reading about blood pressure side effects.  EMS found her hypotensive with systolic in the 60s and she was given 1 L normal saline bolus.  Blood pressure was found to be 68/51, pulse of 65, respiration of 20, temperature 97.5.  Labs are reviewed and show a mild AKI, high-sensitivity troponin negative x 2, serum creatinine 1.15 of up from her baseline of 0.7.  GFR was down to 52.  She was diagnosed with dehydration CT of the cervical spine/CT with no evidence of acute intracranial abnormality no evidence of cervical spine fracture.  She was considered stable for discharge and was able to be discharged  home.    She was last seen in clinic 08/27/2023 after her recent emergency department visit where she had suffered from syncopal episode from dehydration.  Blood pressure was noted to be in the 60s.  She was scheduled for a repeat carotid  duplex and ZIO XT monitoring.  She returns to clinic today suffering from a migraine.  She denies any other syncopal episodes.  Denies any shortness of breath.  Continues to have sharp stabbing chest discomfort under her left breast into the left axilla area on occasion with movement.  States it has not changed in intensity or frequency.  Has been working to stay hydrated and has been adding electrolytes to her fluid.  She states that she continues to have labile blood pressures and.  Has not exactly been compliant with her compression therapy.  States that she has been compliant with her current medication regimen without any adverse effects.  Denies any recent hospitalizations or visits to the emergency department.  ROS: 10 point review of system has been reviewed and considered negative with the exception was listed in the HPI  Studies Reviewed EKG Interpretation Date/Time:  Tuesday October 08 2023 08:29:23 EDT Ventricular Rate:  82 PR Interval:  172 QRS Duration:  90 QT Interval:  380 QTC Calculation: 443 R Axis:   54  Text Interpretation: Normal sinus rhythm Normal ECG When compared with ECG of 27-Aug-2023 08:25, No significant change was found Confirmed by Gerard Frederick ([id number]) on 10/08/2023 8:32:50 AM    Carotid Duplex 09/26/2023 Summary:  Right Carotid: Velocities in the right ICA are consistent with a 1-39%  stenosis.   Left Carotid: Velocities in the left ICA are consistent with a 1-39%  stenosis.   Vertebrals: Bilateral vertebral arteries demonstrate antegrade flow.  Subclavians: Normal flow hemodynamics were seen in bilateral subclavian               arteries.   Event Monitor (Zio) 09/16/2023 Normal sinus rhythm Patient had a min HR of 57 bpm, max HR of 152 bpm, and avg HR of 76 bpm.    1 run of Supraventricular Tachycardia occurred lasting 4 beats with a max rate of 152 bpm (avg 126 bpm). Isolated SVEs were rare (<1.0%), SVE Couplets were rare (<1.0%), and no SVE  Triplets were present.    Isolated VEs were rare (<1.0%), and no VE Couplets or VE Triplets were present. Inverted QRS complexes possibly due to inverted placement of device.   Patient triggered events (18) associated with normal sinus rhythm   LHC 07/02/2023 Conclusions: Mild to moderate, non-obstructive coronary artery disease with heavy mural calcification, as detailed below. Normal left ventricular systolic function (LVEF 55-65%) with mildly elevated filling pressure (LVEDP 20 mmHg).   Recommendations: Continue medical therapy and risk factor modification to prevent progression of coronary artery disease.   Lexiscan  MPI 06/21/2023   Abnormal, probably low risk pharmacologic myocardial perfusion stress test.   There is a moderate in size, mild in severity, partially reversible defect.  The apical septal and apical defects are fixed and most likely represent artifact.  The apical anterior defect is reversible and suspicious for ischemia.   Left ventricular systolic function is normal (LVEF > 65%).   Coronary artery calcification is noted on the attenuation correction CT.   Sensitivity and specificity of the study are degraded by attenuation from breast implants and extracardiac activity.   Compared to the prior study from 08/08/2020, the apical anterior reversible defect appears new   Event Monitor (Zio)  03/09/2022 Conclusion Occasional paroxysmal SVT. No significant sustained arrhythmias. Paroxysmal SVT coincided with patient triggered events.   2D echo 02/28/2022 1. Left ventricular ejection fraction, by estimation, is 60 to 65%. The  left ventricle has normal function. The left ventricle has no regional  wall motion abnormalities. There is mild left ventricular hypertrophy.  Left ventricular diastolic function  could not be evaluated.   2. Right ventricular systolic function is normal. The right ventricular  size is normal.   3. The mitral valve is normal in structure. No evidence  of mitral valve  regurgitation.   4. The aortic valve was not well visualized. Aortic valve regurgitation  is not visualized.   5. The inferior vena cava is normal in size with greater than 50%  respiratory variability, suggesting right atrial pressure of 3 mmHg.    Myoview  Lexicsan 08/2020 Narrative & Impression  There was no ST segment deviation noted during stress. No T wave inversion was noted during stress. Defect 1: There is a small defect of mild severity present in the apex location likely artifact. The study is normal. This is a low risk study. The left ventricular ejection fraction is normal (55-65%). CT attenuation images shows moderate aortic and coronary calcifications.     Myoview  lexiscan  2019 Narrative & Impression  Pharmacological myocardial perfusion imaging study with no significant  ischemia Normal wall motion, EF estimated at 83% No EKG changes concerning for ischemia at peak stress or in recovery. Low risk scan    Cardiac CT scoring 2019 IMPRESSION: Coronary calcium  score of 2456 . This was 35 th percentile for age and sex matched control.   Consider f/u heart catheterization or perfusion study given how extensive the coronary calcification is  Risk Assessment/Calculations           Physical Exam VS:  BP 130/79 (BP Location: Left Arm, Patient Position: Sitting, Cuff Size: Normal)   Pulse 82   Ht 5' 2 (1.575 m)   Wt 147 lb (66.7 kg)   SpO2 98%   BMI 26.89 kg/m        Wt Readings from Last 3 Encounters:  10/08/23 147 lb (66.7 kg)  09/20/23 144 lb 8 oz (65.5 kg)  09/05/23 147 lb 2 oz (66.7 kg)    GEN: Well nourished, well developed in no acute distress NECK: No JVD; No carotid bruits CARDIAC: RRR, II/VI systolic murmur RUSB, without rubs or gallops RESPIRATORY:  Clear to auscultation without rales, wheezing or rhonchi  ABDOMEN: Soft, non-tender, non-distended EXTREMITIES:  No edema; No deformity   ASSESSMENT AND PLAN Nonobstructive  coronary artery disease with previous left heart catheterization revealing mild to moderate nonobstructive disease with heavy mural calcification.  Normal left ventricular systolic function of 55 to 60%, moderately elevated LVEDP of 20 mmHg.  Left anterior descending and mild diffuse disease throughout the vessel with severely calcified left circumflex with mild diffuse disease and severe which was severely calcified, RCA showed proximal RCA lesion 55% stenosis, additional 40% stenosis mid RCA lesion and 6% stenosis recommendation was to continue medical therapy risk factor modification to prevent progression of coronary disease.  She is continue aspirin  81 mg daily, rosuvastatin  40 mg, ezetimibe  10 mg daily Repatha  140 mg every 2 weeks injection.  EKG today reveals sinus rhythm with rate of 82 with no ischemic changes noted.  Labile blood pressures with blood pressure today 130/79.  She was continued on furosemide  20 mg as needed for swelling when she is not required to use it.  She has been encouraged to continue to monitor pressures at home.  Mixed hyperlipidemia cholesterol 46.  She remains at goal less than 55.  She is continued on rosuvastatin , ezetimibe  and Repatha .  Recommend updated lipid panel in October which has already been ordered.  Heart murmur previously noted on exam with echocardiogram revealed an LVEF of 60 to 65%, no RWMA, mild LVH, and no valvular abnormalities.  Continue to monitor with surveillance studies.  Hollenhorst plaque on the right with carotid duplex revealing minimal (1-39% stenosis)bilateral disease.  Cholesterol mains at goal.  Continue aspirin  and statin therapy will continue with surveillance duplex studies.  History of syncope likely vasovagal.  Workup has been unrevealing with the exception of a candidate in the emergency room that has resolved.       Dispo: Patient to return to clinic to see MD/APP in 3 to 4 months or sooner if needed for further  evaluation  Signed, Iasha Mccalister, NP

## 2023-10-08 NOTE — Patient Instructions (Signed)
 Medication Instructions:  Your physician recommends that you continue on your current medications as directed. Please refer to the Current Medication list given to you today.  *If you need a refill on your cardiac medications before your next appointment, please call your pharmacy*  Lab Work: No labs ordered today  If you have labs (blood work) drawn today and your tests are completely normal, you will receive your results only by: MyChart Message (if you have MyChart) OR A paper copy in the mail If you have any lab test that is abnormal or we need to change your treatment, we will call you to review the results.  Testing/Procedures: No test ordered today   Follow-Up: At [facility], you and your health needs are our priority.  As part of our continuing mission to provide you with exceptional heart care, our providers are all part of one team.  This team includes your primary Cardiologist (physician) and Advanced Practice Providers or APPs (Physician Assistants and Nurse Practitioners) who all work together to provide you with the care you need, when you need it.  Your next appointment:   3-4  month(s)  Provider:   You may see Timothy Gollan, MD or one of the following Advanced Practice Providers on your designated Care Team:   Laneta Pintos, NP Gildardo Labrador, PA-C Varney Gentleman, PA-C Cadence [place], PA-C Ronald Cockayne, NP Morey Ar, NP

## 2023-10-08 NOTE — Telephone Encounter (Signed)
 Requested medication (s) are due for refill today: yes  Requested medication (s) are on the active medication list: yes  Last refill:  03/14/23 30 g 2 RF  Future visit scheduled: yes  Notes to clinic:  med not delegated to NT to RF   Requested Prescriptions  Pending Prescriptions Disp Refills   triamcinolone  ointment (KENALOG ) 0.5 % [Pharmacy Med Name: TRIAMCINOLONE  0.5% OINTMENT] 30 g 2    Sig: Apply 1 Application topically 2 (two) times daily. Apply to external ear for 2 weeks     Not Delegated - Dermatology:  Corticosteroids Failed - 10/08/2023 10:59 AM      Failed - This refill cannot be delegated      Passed - Valid encounter within last 12 months    Recent Outpatient Visits           1 month ago Rib pain   [place] [facility] [place], [place] A, MD   1 month ago Hypotension, unspecified hypotension type   [facility] Edman Marsa PARAS, DO   5 months ago Annual physical exam   [place] [facility] Edman Marsa PARAS, DO   6 months ago Hypotension, unspecified hypotension type   [facility] Edman Marsa PARAS, DO   7 months ago Acute non-recurrent frontal sinusitis   [facility] Edman, Marsa PARAS, DO       Future Appointments             In 3 weeks Edman, Marsa PARAS, DO [facility], [place]   In 1 month Gollan, Timothy J, MD [facility]

## 2023-10-09 ENCOUNTER — Ambulatory Visit: Admitting: Professional Counselor

## 2023-10-09 DIAGNOSIS — F3162 Bipolar disorder, current episode mixed, moderate: Secondary | ICD-10-CM | POA: Diagnosis not present

## 2023-10-09 DIAGNOSIS — F4310 Post-traumatic stress disorder, unspecified: Secondary | ICD-10-CM

## 2023-10-09 NOTE — Progress Notes (Unsigned)
  THERAPIST PROGRESS NOTE  Session Time: 9:08 AM - 9:58 AM   Participation Level: {BHH PARTICIPATION LEVEL:22264}  Behavioral Response: {Appearance:22683}{BHH LEVEL OF CONSCIOUSNESS:22305}{BHH MOOD:22306}  Type of Therapy: Individual Therapy  Treatment Goals addressed: ***  ProgressTowards Goals: {Progress Towards Goals:21014066}  Interventions: {CHL AMB BH Type of Intervention:21022753}  Summary: Ashley Fields is a 68 y.o. female who presents with ***.   Suicidal/Homicidal: {BHH YES OR NO:22294}{yes/no/with/without intent/plan:22693}  Therapist Response: Conducted session with . Began session with check-in/update since previous session. Utilized empathetic and reflective listening. Used open-ended questions to facilitate discussion and summarized thoughts/feelings. Scheduled additional appointment and concluded session.   Plan: Return again in *** weeks.  Diagnosis: Bipolar 1 disorder, mixed, moderate (HCC)  PTSD (post-traumatic stress disorder)  Collaboration of Care: Medication Management AEB chart review  Patient/Guardian was advised Release of Information must be obtained prior to any record release in order to collaborate their care with an outside provider. Patient/Guardian was advised if they have not already done so to contact the registration department to sign all necessary forms in order for us  to release information regarding their care.   Consent: Patient/Guardian gives verbal consent for treatment and assignment of benefits for services provided during this visit. Patient/Guardian expressed understanding and agreed to proceed.   Ashley Fields, [facility] 10/09/2023

## 2023-10-12 ENCOUNTER — Other Ambulatory Visit: Payer: Self-pay | Admitting: Family Medicine

## 2023-10-12 DIAGNOSIS — J3089 Other allergic rhinitis: Secondary | ICD-10-CM

## 2023-10-14 ENCOUNTER — Ambulatory Visit: Admitting: Psychiatry

## 2023-10-14 DIAGNOSIS — Z0279 Encounter for issue of other medical certificate: Secondary | ICD-10-CM

## 2023-10-15 NOTE — Telephone Encounter (Signed)
 Requested Prescriptions  Pending Prescriptions Disp Refills   fluticasone  (FLONASE ) 50 MCG/ACT nasal spray [Pharmacy Med Name: FLUTICASONE  PROP 50 MCG SPRAY] 48 mL 0    Sig: SPRAY 2 SPRAYS INTO EACH NOSTRIL EVERY DAY     Ear, Nose, and Throat: Nasal Preparations - Corticosteroids Passed - 10/15/2023  7:58 AM      Passed - Valid encounter within last 12 months    Recent Outpatient Visits           1 month ago Rib pain   [place] [facility] [place], [place] A, MD   1 month ago Hypotension, unspecified hypotension type   [facility] [place]man, Marsa PARAS, DO   5 months ago Annual physical exam   [place] [facility] Edman Marsa PARAS, DO   7 months ago Hypotension, unspecified hypotension type   [place] [facility] Edman Marsa PARAS, DO   8 months ago Acute non-recurrent frontal sinusitis   [place] [facility] [place], Marsa PARAS, DO       Future Appointments             In 2 weeks Edman, Marsa PARAS, DO [place] [facility] [place]   In 2 months Gollan, Timothy J, MD [facility]

## 2023-10-16 ENCOUNTER — Ambulatory Visit (INDEPENDENT_AMBULATORY_CARE_PROVIDER_SITE_OTHER): Admitting: Family Medicine

## 2023-10-16 ENCOUNTER — Encounter: Payer: Self-pay | Admitting: Family Medicine

## 2023-10-16 VITALS — BP 122/68 | HR 76 | Temp 98.3°F | Ht 62.0 in | Wt 144.2 lb

## 2023-10-16 DIAGNOSIS — H6993 Unspecified Eustachian tube disorder, bilateral: Secondary | ICD-10-CM | POA: Diagnosis not present

## 2023-10-16 DIAGNOSIS — J0110 Acute frontal sinusitis, unspecified: Secondary | ICD-10-CM | POA: Diagnosis not present

## 2023-10-16 LAB — POC COVID19 BINAXNOW: SARS Coronavirus 2 Ag: NEGATIVE

## 2023-10-16 MED ORDER — BENZONATATE 100 MG PO CAPS: 100.0000 mg | ORAL_CAPSULE | Freq: Three times a day (TID) | ORAL | 0 refills | Status: DC | PRN

## 2023-10-16 MED ORDER — AZITHROMYCIN 250 MG PO TABS: ORAL_TABLET | ORAL | 0 refills | Status: DC

## 2023-10-16 MED ORDER — PREDNISONE 20 MG PO TABS: ORAL_TABLET | ORAL | 0 refills | Status: DC

## 2023-10-16 NOTE — Patient Instructions (Addendum)
 Thank you for coming to the office today.  Covid negative  Start Azithromycin  Z pak (antibiotic) 2 tabs day 1, then 1 tab x 4 days, complete entire course even if improved  Try the Sudafed behind the counter if need  Keep on allergy treatments  Prednisone  if persistent pressure Can stop ibuprofen   Please schedule a Follow-up Appointment to: Return if symptoms worsen or fail to improve.  If you have any other questions or concerns, please feel free to call the office or send a message through MyChart. You may also schedule an earlier appointment if necessary.  Additionally, you may be receiving a survey about your experience at our office within a few days to 1 week by e-mail or mail. We value your feedback.  Marsa Officer, DO [facility], [place]

## 2023-10-16 NOTE — Progress Notes (Signed)
 Subjective:    Patient ID: Ashley Fields, female    DOB: 1955-12-23, 68 y.o.   MRN: [id number]  Ashley Fields is a 68 y.o. female presenting on 10/16/2023 for Sore Throat  Patient presents for a same day appointment.   HPI  Discussed the use of AI scribe software for clinical note transcription with the patient, who gave verbal consent to proceed.  History of Present Illness   Ashley Fields is a 68 year old female who presents with upper respiratory sinus symptoms.  Sinusitis Upper respiratory and sinus symptoms - Duration of symptoms: over 6 days - Crackling sensation in the ears - Sore throat - Diminished taste - Cough - Nausea - Sensation of pressure and fluid in the sinuses, primarily on the left side of the head - Symptoms worsen at night - Slight wheeze and rattling noise in the chest - No history of asthma - Symptoms occur annually during winter months  Allergic rhinitis and prior treatments - History of allergies with similar symptoms in the past - Current use of Flonase , Astelin , and eye drops for symptom relief - Tolerates Sudafed and ibuprofen, which she uses for pressure relief - No use of steroids since February - Took two leftover antibiotic pills three days ago without significant symptom relief     06/19/2023   10:05 AM 03/14/2023   11:44 AM 10/31/2022    8:35 AM  Depression screen PHQ 2/9  Decreased Interest  1 1  Down, Depressed, Hopeless  0 1  PHQ - 2 Score  1 2  Altered sleeping  1 0  Tired, decreased energy  1 1  Change in appetite  0 0  Feeling bad or failure about yourself   0 0  Trouble concentrating  0 0  Moving slowly or fidgety/restless  0 0  Suicidal thoughts  0 0  PHQ-9 Score  3 3  Difficult doing work/chores  Somewhat difficult Not difficult at all     Information is confidential and restricted. Go to Review Flowsheets to unlock data.       06/19/2023   10:04 AM 03/14/2023   11:44 AM 10/31/2022    8:35 AM 10/15/2022   12:02 PM  GAD 7  : Generalized Anxiety Score  Nervous, Anxious, on Edge  1 1   Control/stop worrying  0 0   Worry too much - different things  0 1   Trouble relaxing  1 1   Restless  0 0   Easily annoyed or irritable  1 1   Afraid - awful might happen  0 0   Total GAD 7 Score  3 4   Anxiety Difficulty  Not difficult at all       Information is confidential and restricted. Go to Review Flowsheets to unlock data.    Social History   Tobacco Use   Smoking status: Former    Current packs/day: 0.00    Average packs/day: 0.3 packs/day for 10.0 years (2.5 ttl pk-yrs)    Types: Cigarettes    Start date: 11/28/1985    Quit date: 11/29/1995    Years since quitting: 27.8    Passive exposure: Past   Smokeless tobacco: Former   Tobacco comments:    Quite 1997, didnt smoke hardly at all when I was smoking.  Vaping Use   Vaping status: Never Used  Substance Use Topics   Alcohol use: No   Drug use: No    Review of Systems Per HPI unless specifically indicated  above     Objective:    BP 122/68 (BP Location: Right Arm, Patient Position: Sitting, Cuff Size: Normal)   Pulse 76   Temp 98.3 F (36.8 C) (Oral)   Ht 5' 2 (1.575 m)   Wt 144 lb 4 oz (65.4 kg)   SpO2 99%   BMI 26.38 kg/m   Wt Readings from Last 3 Encounters:  10/16/23 144 lb 4 oz (65.4 kg)  10/08/23 147 lb (66.7 kg)  09/20/23 144 lb 8 oz (65.5 kg)    Physical Exam Vitals and nursing note reviewed.  Constitutional:      General: She is not in acute distress.    Appearance: She is well-developed. She is not diaphoretic.     Comments: Well-appearing, comfortable, cooperative  HENT:     Head: Normocephalic and atraumatic.     Right Ear: Tympanic membrane, ear canal and external ear normal. There is no impacted cerumen.     Left Ear: Ear canal and external ear normal. There is no impacted cerumen.     Ears:     Comments: Left TM effusion clear no erythema bulging Eyes:     General:        Right eye: No discharge.        Left  eye: No discharge.     Conjunctiva/sclera: Conjunctivae normal.  Neck:     Thyroid : No thyromegaly.  Cardiovascular:     Rate and Rhythm: Normal rate and regular rhythm.     Heart sounds: Normal heart sounds. No murmur heard. Pulmonary:     Effort: Pulmonary effort is normal. No respiratory distress.     Breath sounds: Normal breath sounds. No wheezing or rales.  Musculoskeletal:        General: Normal range of motion.     Cervical back: Normal range of motion and neck supple.  Lymphadenopathy:     Cervical: No cervical adenopathy.  Skin:    General: Skin is warm and dry.     Findings: No erythema or rash.  Neurological:     Mental Status: She is alert and oriented to person, place, and time.  Psychiatric:        Behavior: Behavior normal.     Comments: Well groomed, good eye contact, normal speech and thoughts     Results for orders placed or performed in visit on 10/16/23  POC COVID-19 BinaxNow   Collection Time: 10/16/23 10:02 AM  Result Value Ref Range   SARS Coronavirus 2 Ag Negative Negative      Assessment & Plan:   Problem List Items Addressed This Visit   None Visit Diagnoses       Acute non-recurrent frontal sinusitis    -  Primary   Relevant Medications   predniSONE  (DELTASONE ) 20 MG tablet   azithromycin  (ZITHROMAX  Z-PAK) 250 MG tablet   benzonatate (TESSALON) 100 MG capsule   Other Relevant Orders   POC COVID-19 BinaxNow (Completed)     Eustachian tube dysfunction, bilateral            Acute sinusitis with left-sided sinus pressure and fluid Acute sinusitis with predominant left-sided sinus pressure and fluid accumulation for six days. Negative COVID test. Main symptom is sinus pressure due to fluid backup. - No AOM ear infection  - Recommend behind-the-counter Sudafed for pressure relief. - Prescribe prednisone  if Sudafed is insufficient. - Advise ibuprofen with Sudafed if tolerated, discontinue if switching to prednisone . - Prescribe  azithromycin  (Z-Pak) if symptoms persist today or tomorrow. -  Prescribe Tessalon Perles for cough management.  Allergic rhinitis Allergic rhinitis with watery, itchy eyes and ear pressure. Managed with Flonase , Astelin , and eye drops. - Continue current allergy medications: Flonase , Astelin , and eye drops.  Follow-Up Symptom management and medication efficacy discussion. Plans to return to [place]  by October 30th, necessitating symptom resolution before travel. - Instruct to keep in touch regarding symptom progression. - Schedule follow-up after the weekend if symptoms persist.        Orders Placed This Encounter  Procedures   POC COVID-19 BinaxNow    Meds ordered this encounter  Medications   predniSONE  (DELTASONE ) 20 MG tablet    Sig: Take daily with food. Start with 60mg  (3 pills) x 2 days, then reduce to 40mg  (2 pills) x 2 days, then 20mg  (1 pill) x 3 days    Dispense:  13 tablet    Refill:  0   azithromycin  (ZITHROMAX  Z-PAK) 250 MG tablet    Sig: Take 2 tabs (500mg  total) on Day 1. Take 1 tab (250mg ) daily for next 4 days.    Dispense:  6 tablet    Refill:  0   benzonatate (TESSALON) 100 MG capsule    Sig: Take 1 capsule (100 mg total) by mouth 3 (three) times daily as needed for cough.    Dispense:  30 capsule    Refill:  0    Follow up plan: Return if symptoms worsen or fail to improve.   Marsa Officer, DO [facility] 10/16/2023, 10:02 AM

## 2023-10-20 ENCOUNTER — Other Ambulatory Visit: Payer: Self-pay | Admitting: Family Medicine

## 2023-10-20 DIAGNOSIS — H6993 Unspecified Eustachian tube disorder, bilateral: Secondary | ICD-10-CM

## 2023-10-20 DIAGNOSIS — J329 Chronic sinusitis, unspecified: Secondary | ICD-10-CM

## 2023-10-22 ENCOUNTER — Ambulatory Visit: Admitting: Cardiology

## 2023-10-22 NOTE — Telephone Encounter (Signed)
 Requested Prescriptions  Pending Prescriptions Disp Refills   Azelastine  HCl 137 MCG/SPRAY SOLN [Pharmacy Med Name: AZELASTINE  0.1% (137 MCG) SPRY] 30 mL 0    Sig: PLACE 2 SPRAYS INTO BOTH NOSTRILS 2 (TWO) TIMES DAILY. USE IN EACH NOSTRIL AS DIRECTED     Ear, Nose, and Throat: Nasal Preparations - Antiallergy Passed - 10/22/2023  2:19 PM      Passed - Valid encounter within last 12 months    Recent Outpatient Visits           6 days ago Acute non-recurrent frontal sinusitis   [place] [facility] [place], Marsa PARAS, DO   1 month ago Rib pain   [place] [facility], [place] A, MD   1 month ago Hypotension, unspecified hypotension type   [facility] [place] Marsa PARAS, DO   5 months ago Annual physical exam   [place] [facility] [place] Marsa PARAS, DO   7 months ago Hypotension, unspecified hypotension type   [facility] [place], Marsa PARAS, DO       Future Appointments             In 1 week [place], Marsa PARAS, DO [place] [place]   In 2 months Perla, Timothy J, MD [facility]

## 2023-10-23 ENCOUNTER — Ambulatory Visit (INDEPENDENT_AMBULATORY_CARE_PROVIDER_SITE_OTHER): Admitting: Professional Counselor

## 2023-10-23 DIAGNOSIS — F3162 Bipolar disorder, current episode mixed, moderate: Secondary | ICD-10-CM | POA: Diagnosis not present

## 2023-10-23 DIAGNOSIS — F4310 Post-traumatic stress disorder, unspecified: Secondary | ICD-10-CM

## 2023-10-23 NOTE — Progress Notes (Unsigned)
  THERAPIST PROGRESS NOTE  Session Time: 8:30 AM - 9:00 AM   Participation Level: Active  Behavioral Response: Well Groomed, Alert, Irritable  Type of Therapy: Individual Therapy  Treatment Goals addressed: Active BH CCP BIPOLAR DISORDER  LTG: When I don't have to take all this medication. I just want to feel better, not feel so depressed, so anxious. I want to be able to have a normal day. Just not feeling anxious, depressed, other people have normal days, they look happy, laughing.                 Start:  07/29/23    Expected End:  07/27/24      STG: My daughter-in-law and I might get along one day. To improve relationships AEB self-report of improvement by using interpersonal effectiveness skills over the next 90 days.     STG: To not have this level of anxiousness I feel to not have panic attacks so often. To reduce sxs of anxiety AEB reduction of GAD7 scores by using coping mechanisms over the next 90 days.     STG: If I let that anger go and I get depressed and I can get so mad, I'll lose control. I just want to destroy everything. To improve emotion regulation AEB self-report reduction in outbursts by using coping mechanisms over the next 90 days.    ProgressTowards Goals: Progressing  Interventions: Motivational Interviewing and Supportive  Summary: Shanasia Ibrahim is a 68 y.o. female who presents with a history of bipolar disorder and PTSD. She appeared alert and oriented x5. She stated things have improved at home since her husband has not been drinking as much. Buna discussed her upcoming trip to [place]  to visit her son and work on her house. She shared about family dynamics with her daughter-in-law and expressed her frustration with her not allowing her to have a relationship with her grandchildren. She reported that she'd rather not cause commotion about it since it puts her son in the middle. Fatimah was receptive to [employer] and plans to write out a statement to  share with her son and daughter-in-law.   Therapist Response: Conducted session with [employer]. Began session with check-in/update since previous session. Utilized empathetic and reflective listening. Used open-ended questions to facilitate discussion and summarized Crystallynn's thoughts/feelings. Reviewed [employer] and provided additional handout to help Rumor shape conversations for objective effectiveness. Scheduled additional appointment and concluded session. (Additional note - Janise arrived on time for session but due to staff/technical error it was not showing her as arrived and this is what caused the late start time on her session.)  Suicidal/Homicidal: No  Plan: Return again in 4 weeks.  Diagnosis: Bipolar 1 disorder, mixed, moderate (HCC)  PTSD (post-traumatic stress disorder)  Collaboration of Care: Medication Management AEB chart review  Patient/Guardian was advised Release of Information must be obtained prior to any record release in order to collaborate their care with an outside provider. Patient/Guardian was advised if they have not already done so to contact the registration department to sign all necessary forms in order for us  to release information regarding their care.   Consent: Patient/Guardian gives verbal consent for treatment and assignment of benefits for services provided during this visit. Patient/Guardian expressed understanding and agreed to proceed.   Almarie JONETTA Ligas, [facility] 10/24/2023

## 2023-10-29 ENCOUNTER — Other Ambulatory Visit: Payer: Self-pay | Admitting: Family Medicine

## 2023-10-29 ENCOUNTER — Encounter: Payer: Self-pay | Admitting: Psychiatry

## 2023-10-29 ENCOUNTER — Other Ambulatory Visit: Payer: Self-pay

## 2023-10-29 ENCOUNTER — Ambulatory Visit (INDEPENDENT_AMBULATORY_CARE_PROVIDER_SITE_OTHER): Admitting: Psychiatry

## 2023-10-29 VITALS — BP 120/68 | HR 75 | Ht 62.0 in | Wt 142.8 lb

## 2023-10-29 DIAGNOSIS — F4310 Post-traumatic stress disorder, unspecified: Secondary | ICD-10-CM | POA: Diagnosis not present

## 2023-10-29 DIAGNOSIS — Z9189 Other specified personal risk factors, not elsewhere classified: Secondary | ICD-10-CM | POA: Diagnosis not present

## 2023-10-29 DIAGNOSIS — F410 Panic disorder [episodic paroxysmal anxiety] without agoraphobia: Secondary | ICD-10-CM

## 2023-10-29 DIAGNOSIS — F3132 Bipolar disorder, current episode depressed, moderate: Secondary | ICD-10-CM | POA: Diagnosis not present

## 2023-10-29 DIAGNOSIS — G4701 Insomnia due to medical condition: Secondary | ICD-10-CM

## 2023-10-29 DIAGNOSIS — E538 Deficiency of other specified B group vitamins: Secondary | ICD-10-CM

## 2023-10-29 MED ORDER — BREXPIPRAZOLE 0.25 MG PO TABS: 0.2500 mg | ORAL_TABLET | Freq: Every day | ORAL | 0 refills | Status: DC

## 2023-10-29 NOTE — Progress Notes (Signed)
 BH MD OP Progress Note  10/29/2023 4:03 PM Ashley Fields  MRN:  [id number]  Chief Complaint:  Chief Complaint  Patient presents with   Follow-up   Anxiety   Depression   Medication Refill   Discussed the use of AI scribe software for clinical note transcription with the patient, who gave verbal consent to proceed.  History of Present Illness Ashley Fields is a 68 year old Caucasian female, married, on disability, lives in [place], has a history of bipolar disorder, PTSD, panic disorder, insomnia, chronic pain, migraine headaches, history of laparoscopic gastric bypass was evaluated in office today for a follow-up appointment.  Ongoing marital discord and the process of moving to [place]  contribute to her feelings of stress and overwhelm. She describes persistent anxiety, stating that her whole body feels like it is shaking inside, and she links her increased anxiety to significant life changes. She reports that daily use of hydroxyzine  helps her manage anxiety.  Depressive symptoms include a lack of joy in her current environment, low energy, and emotional detachment from her husband. She reports no feelings for her husband, has not been sexually intimate for 2 years, and no longer expresses affection toward him. She finds no joy in life where she currently lives and hopes that being in [place]  with her family will improve her mood.  Sleep disturbances, including non-restorative sleep and frequent bad dreams, affect her nightly. She takes trazodone  nightly, which helps her fall asleep, but she does not feel rested upon waking and often wants to return to bed during the day. She describes waking up shaking and sometimes crying after these dreams, which have occurred nightly for the past 3 days.  She denies hallucinations, paranoia, and thoughts of hurting herself or others.  Her current medication regimen includes trazodone  at night for sleep, venlafaxine  150 mg at night, and oxcarbazepine   (Trileptal ) 750 mg . She also uses hydroxyzine  daily for anxiety.    Visit Diagnosis:    ICD-10-CM   1. Bipolar 1 disorder, depressed, moderate (HCC)  F31.32 brexpiprazole  (REXULTI ) 0.25 MG TABS tablet    2. PTSD (post-traumatic stress disorder)  F43.10     3. Panic disorder  F41.0     4. Insomnia due to medical condition  G47.01    mood , lack of sleep hygiene, pain    5. At risk for prolonged QT interval syndrome  Z91.89 EKG 12-Lead      Past Psychiatric History: I have reviewed past psychiatric history from progress note on 05/15/2018.  Past trials of Prozac, Wellbutrin, Lexapro, Cymbalta, Rexulti , Ambien -nightmares, Lunesta , Lamictal , risperidone   Past Medical History:  Past Medical History:  Diagnosis Date   Abdominal wall seroma (postoperative) 2025   Allergy    Anxiety    Aortic atherosclerosis    Arthritis    Back pain    a.) s/p L4-L5 fusion 2008; b.) s/p PLIF L2-L3 and ALIF L5-S1 12/2021   Bilateral carotid artery disease    Bipolar disorder (HCC)    Blood transfusion without reported diagnosis 1977   Transfusions from miscarriage & hemorrhaging   CAD (coronary artery disease)    Chronic kidney disease    DDD (degenerative disc disease), cervical    a.) s/p ACDF C4-C7   Depression    Disp fx of cuboid bone of right foot, init for clos fx 01/01/2017   Displacement of breast implant 2025   Fibromyalgia    GERD (gastroesophageal reflux disease)    Headache    Hepatitis C virus infection  cured after antiviral drug therapy    a.) s/p Tx with ledipasvir/sofosbuvir   History of 2019 novel coronavirus disease (COVID-19) 01/23/2020   Hollenhorst plaque, right eye    Hyperlipidemia    Hypoglycemia    Insomnia    a.) uses trazodone  PRN   Iron  deficiency anemia following bariatric surgery    Long-term use of aspirin  therapy    Lumbar radiculopathy    Neck pain    Neuromuscular disorder (HCC) 1997   Falling to much was an eye-opener   Osteoporosis    PSVT  (paroxysmal supraventricular tachycardia)    Systolic murmur    Thyroid  disease    Urine incontinence     Past Surgical History:  Procedure Laterality Date   5 miscarriages     1977-1985, Blood transfusion s/p miscarriage 1977   ABDOMINAL EXPOSURE N/A 12/18/2021   Procedure: ABDOMINAL EXPOSURE;  Surgeon: Gretta Lonni PARAS, MD;  Location: [facility] OR;  Service: Vascular;  Laterality: N/A;   ACDF C4-C7  02/10/2015   ANTERIOR LUMBAR FUSION N/A 12/18/2021   Procedure: Anterior Lumbar Interbody Fusion  - Lumbar Five-Sacral One;  Surgeon: Joshua Alm RAMAN, MD;  Location[facility] OR;  Service: Neurosurgery;  Laterality: N/A;   APPENDECTOMY  12/2008   AUGMENTATION MAMMAPLASTY Bilateral 2015   AUGMENTATION MAMMAPLASTY Bilateral 2018   implants redone w/ placement of implants under muscle   breast lift bilateral, implants  05/18/2015   bilateral, silicon naturel   BREAST REDUCTION SURGERY Bilateral 1997   CARDIOVERSION     2025   CESAREAN SECTION  07/27/1981   Placenta Previa   COLONOSCOPY WITH PROPOFOL  N/A 06/13/2020   Procedure: COLONOSCOPY WITH PROPOFOL ;  Surgeon: Janalyn Keene NOVAK, MD;  Location: [facility] ENDOSCOPY;  Service: Endoscopy;  Laterality: N/A;   COSMETIC SURGERY     Breast implants w/ lift, tummy tuck, upper & lower Blepharop   DILATION AND CURETTAGE OF UTERUS     ESOPHAGOGASTRODUODENOSCOPY (EGD) WITH PROPOFOL  N/A 06/13/2020   Procedure: ESOPHAGOGASTRODUODENOSCOPY (EGD) WITH PROPOFOL ;  Surgeon: Janalyn Keene NOVAK, MD;  Location: [facility] ENDOSCOPY;  Service: Endoscopy;  Laterality: N/A;   EXCISION OF ABDOMINAL WALL TUMOR N/A 04/08/2023   Procedure: EXCISION, NEOPLASM, ABDOMINAL WALL;  Surgeon: Rodolph Romano, MD;  Location: [facility] ORS;  Service: General;  Laterality: N/A;   EYE SURGERY  2017   Cataract surgery & lasik   GASTRIC BYPASS  12/24/2003   IVC FILTER INSERTION  12/2003   TrapEase Vena Cava Filter   LAPAROSCOPIC CHOLECYSTECTOMY N/A 03/2004   LEFT HEART CATH AND CORONARY  ANGIOGRAPHY Left 07/02/2023   Procedure: LEFT HEART CATH AND CORONARY ANGIOGRAPHY;  Surgeon: Mady Lonni, MD;  Location: [facility] INVASIVE CV LAB;  Service: Cardiovascular;  Laterality: Left;   LUMBAR LAMINECTOMY  06/2006   L4-L5 (spinal fusion)   mini tummy tuck  05/07/2016   Bilateral bra/back roll lift skin removal   REDUCTION MAMMAPLASTY Bilateral 1997   SHOULDER ACROMIOPLASTY Left 03/27/2013   w/ labral debridement   SPINAL CORD STIMULATOR IMPLANT  11/2010   removed 05/2011   TOTAL ABDOMINAL HYSTERECTOMY N/A 1987    Family Psychiatric History: I have reviewed family psychiatric history from progress note on 05/15/2018.  Family History:  Family History  Problem Relation Age of Onset   COPD Mother    Lung cancer Mother 69   Diabetes Mother    Heart disease Mother    Stroke Mother    Heart attack Mother    Arthritis Mother    Hypertension Mother  Obesity Mother    Varicose Veins Mother    Heart disease Father    Heart attack Father 42   Early death Father    Depression Sister        x 5 sisters   COPD Sister    Hypertension Sister    Colon polyps Sister    Hearing loss Sister    Heart attack Sister    Arthritis Sister    Varicose Veins Sister    COPD Sister    Obesity Sister    Depression Sister    Depression Sister    Heart disease Sister    Hypertension Sister    Hypertension Sister    Obesity Sister    Heart disease Brother        x 3 brothers   Diabetes Brother    Heart attack Brother    Arthritis Brother    Heart disease Brother    Hypertension Brother    Diabetes Brother    Heart disease Brother    Hypertension Brother    Obesity Brother    Varicose Veins Brother    Heart disease Brother    Hypertension Brother    Obesity Brother    Miscarriages / Stillbirths Maternal Aunt    Miscarriages / Stillbirths Paternal Aunt    Breast cancer Paternal Aunt        early years   Cancer Maternal Grandfather    Stroke Maternal Grandfather     Social  History: I have reviewed social history from progress note on 05/15/2018. Social History   Socioeconomic History   Marital status: Married    Spouse name: Jama, Mcmiller (Spouse) [phone] ([place])   Number of children: 1   Years of education: High School   Highest education level: Some college, no degree  Occupational History   Occupation: disability  Tobacco Use   Smoking status: Former    Current packs/day: 0.00    Average packs/day: 0.3 packs/day for 10.0 years (2.5 ttl pk-yrs)    Types: Cigarettes    Start date: 11/28/1985    Quit date: 11/29/1995    Years since quitting: 27.9    Passive exposure: Past   Smokeless tobacco: Former   Tobacco comments:    Quite 1997, didnt smoke hardly at all when I was smoking.  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]   Social Drivers of Corporate Investment Banker Strain: Low Risk  (08/25/2023)   Overall Financial Resource Strain (CARDIA)    Difficulty of Paying Living Expenses: Not very hard  Food Insecurity: Food Insecurity Present (08/25/2023)   Hunger Vital Sign    Worried About Running Out of Food in the Last Year: Sometimes true    Ran Out of Food in the Last Year: Sometimes true  Transportation Needs: No Transportation Needs (08/25/2023)   PRAPARE - Administrator, Civil Service (Medical): No    Lack of Transportation (Non-Medical): No  Physical Activity: Sufficiently Active (08/25/2023)   Exercise Vital Sign    Days of Exercise per Week: 7 days    Minutes of Exercise per Session: 30  min  Stress: No Stress Concern Present (08/25/2023)   [employer] of  Occupational Health - Occupational Stress Questionnaire    Feeling of Stress: Only a little  Recent Concern: Stress - Stress Concern Present (06/19/2023)   [employer] of Occupational Health - Occupational Stress Questionnaire    Feeling of Stress : Very much  Social Connections: Moderately Isolated (08/25/2023)   Social Connection and Isolation Panel    Frequency of Communication with Friends and Family: Three times a week    Frequency of Social Gatherings with Friends and Family: Once a week    Attends Religious Services: Never    Database Administrator or Organizations: No    Attends Engineer, Structural: Not on file    Marital Status: Married    Allergies:  Allergies  Allergen Reactions   Flagyl [Metronidazole] Anaphylaxis   Cymbalta [Duloxetine Hcl] Anxiety   Tape Rash    Paper tape okay to use per patient.    Metabolic Disorder Labs: Lab Results  Component Value Date   HGBA1C 5.1 04/24/2023   MPG 100 04/24/2023   MPG 108 05/01/2022   No results found for: PROLACTIN Lab Results  Component Value Date   CHOL 136 04/24/2023   TRIG 59 04/24/2023   HDL 76 04/24/2023   CHOLHDL 1.8 04/24/2023   LDLCALC 46 04/24/2023   LDLCALC 43 05/01/2022   Lab Results  Component Value Date   TSH 1.61 04/24/2023   TSH 2.11 05/01/2022    Therapeutic Level Labs: No results found for: LITHIUM No results found for: VALPROATE No results found for: CBMZ  Current Medications: Current Outpatient Medications  Medication Sig Dispense Refill   aspirin  EC (ASPIRIN  LOW DOSE) 81 MG tablet TAKE 1 TABLET BY MOUTH EVERY DAY 90 tablet 0   Azelastine  HCl 137 MCG/SPRAY SOLN PLACE 2 SPRAYS INTO BOTH NOSTRILS 2 (TWO) TIMES DAILY. USE IN EACH NOSTRIL AS DIRECTED 30 mL 0   azithromycin  (ZITHROMAX  Z-PAK) 250 MG tablet Take 2 tabs (500mg  total) on Day 1. Take 1 tab (250mg ) daily for next 4 days. 6 tablet 0   benzonatate  (TESSALON ) 100 MG capsule Take 1 capsule (100 mg total) by  mouth 3 (three) times daily as needed for cough. 30 capsule 0   bisacodyl  (DULCOLAX) 5 MG EC tablet Take 5 mg by mouth daily as needed for moderate constipation.     brexpiprazole  (REXULTI ) 0.25 MG TABS tablet Take 1 tablet (0.25 mg total) by mouth daily. 90 tablet 0   butalbital -acetaminophen -caffeine  (FIORICET) 50-325-40 MG tablet Take 1 tablet by mouth 2 (two) times daily as needed for headache.     CALCIUM  PO Take 1 tablet by mouth 2 (two) times daily. Mini     Cholecalciferol 250 MCG (10000 UT) CAPS Take 10,000 Units by mouth once a week.     cyanocobalamin  (VITAMIN B12) 1000 MCG/ML injection INJECT 1 ML (1,000 MCG TOTAL) INTO THE MUSCLE EVERY 30 DAYS. 3 mL 0   diclofenac Sodium (VOLTAREN) 1 % GEL Apply 2 g topically 4 (four) times daily as needed (pain).     estradiol  (ESTRACE ) 1 MG tablet TAKE 1 TABLET BY MOUTH EVERY DAY 90 tablet 1   etodolac  (LODINE ) 500 MG tablet Take 1 tablet by mouth 2 (two) times daily.     Evolocumab  (REPATHA  SURECLICK) 140 MG/ML SOAJ Inject 140 mg into the skin every 14 (fourteen) days. 6 mL 3   ezetimibe  (ZETIA ) 10 MG tablet TAKE 1 TABLET BY MOUTH EVERY DAY 90  tablet 3   fexofenadine  (ALLEGRA ) 180 MG tablet Take 1 tablet (180 mg total) by mouth daily.     fluticasone  (FLONASE ) 50 MCG/ACT nasal spray SPRAY 2 SPRAYS INTO EACH NOSTRIL EVERY DAY 48 mL 0   gabapentin  (NEURONTIN ) 600 MG tablet Take 1 tablet (600 mg total) by mouth in the morning, at noon, in the evening, and at bedtime. 360 tablet 3   hydrOXYzine  (ATARAX ) 25 MG tablet Take 1 tablet (25 mg total) by mouth 3 (three) times daily as needed for anxiety. 270 tablet 0   Multiple Vitamins-Minerals (MULTIVITAMIN WITH MINERALS) tablet Take 1 tablet by mouth daily.     omeprazole  (PRILOSEC) 40 MG capsule Take 1 capsule (40 mg total) by mouth daily. 90 capsule 3   ondansetron  (ZOFRAN -ODT) 4 MG disintegrating tablet TAKE 1 TABLET BY MOUTH EVERY 8 HOURS AS NEEDED FOR NAUSEA AND VOMITING 30 tablet 2   Oxcarbazepine   (TRILEPTAL ) 300 MG tablet TAKE 2.5 TABS BY MOUTH AS DIRECTED. TAKE 1 TABLET DAILY AT 8 AM AND HALF TABLET AT NOON AND 1 TABLET DAILY AT 8 PM 75 tablet 2   oxyCODONE -acetaminophen  (PERCOCET) 7.5-325 MG tablet Take 1 tablet by mouth every 6 (six) hours.     predniSONE  (DELTASONE ) 20 MG tablet Take daily with food. Start with 60mg  (3 pills) x 2 days, then reduce to 40mg  (2 pills) x 2 days, then 20mg  (1 pill) x 3 days 13 tablet 0   RESTASIS 0.05 % ophthalmic emulsion Place 1 drop into both eyes 2 (two) times daily.     rizatriptan  (MAXALT -MLT) 10 MG disintegrating tablet Take 1 tablet (10 mg total) by mouth as needed for migraine. May repeat in 2 hours if needed, that is max dose for 24 hours. 10 tablet 3   rosuvastatin  (CRESTOR ) 40 MG tablet TAKE 1 TABLET BY MOUTH EVERY DAY 90 tablet 3   tiZANidine  (ZANAFLEX ) 4 MG tablet Take 1 tablet (4 mg total) by mouth 4 (four) times daily. 120 tablet 5   traZODone  (DESYREL ) 100 MG tablet Take 1-2 tablets (100-200 mg total) by mouth at bedtime as needed for sleep. Has supplies     triamcinolone  ointment (KENALOG ) 0.5 % APPLY 1 APPLICATION TOPICALLY 2 (TWO) TIMES DAILY. APPLY TO EXTERNAL EAR FOR 2 WEEKS 30 g 2   valACYclovir  (VALTREX ) 1000 MG tablet TAKE 1 TABLET (1,000 MG TOTAL) BY MOUTH DAILY. FOR 5-7 DAYS, MAY REPEAT IF NEED FOR COLD SORE 90 tablet 1   venlafaxine  XR (EFFEXOR -XR) 150 MG 24 hr capsule TAKE 1 CAPSULE (150 MG TOTAL) BY MOUTH DAILY WITH BREAKFAST. DOSE INCREASE 90 capsule 1   Zoledronic  Acid (ZOMETA ) 4 MG/100ML IVPB Once per year from Orthopedic 100 mL    furosemide  (LASIX ) 20 MG tablet TAKE 1 TABLET ONE TIME DAILY AS NEEDED FOR FLUID OR EDEMA (Patient not taking: Reported on 10/29/2023) 90 tablet 1   No current facility-administered medications for this visit.     Musculoskeletal: Strength & Muscle Tone: within normal limits Gait & Station: normal Patient leans: N/A  Psychiatric Specialty Exam: Review of Systems  Psychiatric/Behavioral:   Positive for dysphoric mood and sleep disturbance. The patient is nervous/anxious.     Blood pressure 120/68, pulse 75, height 5' 2 (1.575 m), weight 142 lb 12.8 oz (64.8 kg), SpO2 99%.Body mass index is 26.12 kg/m.  General Appearance: Casual  Eye Contact:  Fair  Speech:  Normal Rate  Volume:  Normal  Mood:  Anxious and Depressed  Affect:  Congruent  Thought Process:  Goal Directed and Descriptions of Associations: Intact  Orientation:  Full (Time, Place, and Person)  Thought Content: Logical   Suicidal Thoughts:  No  Homicidal Thoughts:  No  Memory:  Immediate;   Fair Recent;   Fair Remote;   Fair  Judgement:  Fair  Insight:  Fair  Psychomotor Activity:  Normal  Concentration:  Concentration: Fair and Attention Span: Fair  Recall:  [employer] of Knowledge: Fair  Language: Fair  Akathisia:  No  Handed:  Right  AIMS (if indicated): done  Assets:  Communication Skills Desire for Improvement Housing Social Support Talents/Skills  ADL's:  Intact  Cognition: WNL  Sleep:  Poor   Screenings: Geneticist, Molecular Office Visit from 10/29/2023 in [facility] Video Visit from 06/13/2021 in [facility] Video Visit from 05/16/2021 in [facility]  AIMS Total Score 0 0 0   GAD-7    Flowsheet Row Office Visit from 10/29/2023 in [facility] Counselor from 06/19/2023 in [facility] Office Visit from 03/14/2023 in [facility] Office Visit from 10/31/2022 in [facility] Office Visit from 10/15/2022 in [facility]  Total GAD-7 Score 8 12 3 4 7    PHQ2-9    Flowsheet Row Office Visit from 10/29/2023 in [facility] Counselor from 06/19/2023 in [facility] Office Visit from 03/14/2023 in [facility] Office Visit from 10[facility]31/2022 in [facility] Office Visit from 10/15/2022 in [facility]  PHQ-2 Total Score 1 6 1 2 2   PHQ-9 Total Score 7 17 3 3 3    Flowsheet Row Office Visit from 10/29/2023 in [facility] ED from 08/25/2023 in [facility] Video Visit from 07/22/2023 in [facility]  C-SSRS RISK CATEGORY No Risk No Risk No Risk     Assessment and Plan: Ashley Fields is a 68 year old Caucasian female who has a history of bipolar disorder, PTSD, panic disorder, insomnia was evaluated in office today, discussed assessment and plan as noted below.  1. Bipolar 1 disorder, depressed, moderate (HCC)-unstable Currently with ongoing depression symptoms as well as anxiety mostly situational.  Would like to try Rexulti  which she previously tried and could not afford at that time.  The other option is Latuda. Start Rexulti  0.25 mg daily Continue Trileptal  750 mg daily (sodium level dated 09/18/2023-141-normal range, platelet count low at 142 dated 09/18/2023 needs to be repeated) Continue Gabapentin  600 mg 4 times a day.  2. PTSD (post-traumatic stress disorder)-unstable Currently reports worsening anxiety and nightmares although she is unable to remember them still disrupts her sleep. Encouraged to continue psychotherapy sessions. Continue Venlafaxine  150 mg daily Continue Hydroxyzine  50 mg twice a day as needed Continue Trazodone  200 mg at bedtime   3. Panic disorder-unstable Recent worsening of anxiety symptoms likely situational. Will reevaluate in future sessions.  She is relocating to [place]  and she believes that might help. Continue Venlafaxine  150 mg daily Continue Hydroxyzine  as prescribed  4.  Insomnia due to medical condition-unstable Recent worsening sleep problems mostly due to situational anxiety, moving to [place]  and nightmares. Continue Trazodone  200 mg at bedtime for now. Encouraged to use relaxation techniques mindfulness strategies.  5. At risk for prolonged QT interval syndrome Will repeat EKG after starting Rexulti .  Patient to call [phone] to get this done with her primary care provider.  Reviewed and discussed most recent EKG dated 10/08/2023-normal sinus rhythm, QTc 443.  Follow-up Follow-up in clinic in 6 weeks or sooner in person.  Collaboration of Care: Collaboration of Care: Referral or follow-up with counselor/therapist AEB encouraged to continue psychotherapy sessions.  Patient currently following up with Ms.Gainey last appointment-10/23/2023.  Patient/Guardian was advised Release of Information must be obtained prior to any record release in order to collaborate their care with an outside provider. Patient/Guardian was advised if they have not already done so to contact the registration department to sign all necessary forms in order for us  to release information regarding their care.   Consent: Patient/Guardian gives verbal consent for treatment and assignment of benefits for services provided during this visit. Patient/Guardian expressed understanding and agreed to proceed.  This note was generated in part or whole with voice recognition software. Voice recognition is usually quite accurate but there are transcription errors that can and very often do occur. I apologize for any typographical errors that were not detected and corrected.     Ashley Gassen, MD 10/30/2023, 12:24 PM

## 2023-10-29 NOTE — Patient Instructions (Signed)
 Please call for EKG - [phone]   Brexpiprazole Tablets What is this medication? BREXPIPRAZOLE (brex PIP ray zole) treats schizophrenia. It may also be used with antidepressant medication to treat depression. It can also be used to treat agitation caused by Alzheimer disease. It works by balancing the levels of dopamine and serotonin in your brain, substances that help regulate mood, behaviors, and thoughts. It belongs to a group of medications called antipsychotics. Antipsychotic medications can be used to treat several kinds of mental health conditions. This medicine may be used for other purposes; ask your health care provider or pharmacist if you have questions. COMMON BRAND NAME(S): REXULTI What should I tell my care team before I take this medication? They need to know if you have any of these conditions: Dementia Diabetes Have trouble controlling your muscles Heart disease High cholesterol History of breast cancer History of stroke Kidney disease Liver disease Low blood cell levels (white cells, red cells, and platelets) Low blood pressure Parkinson disease Seizures Suicidal thoughts, plans, or attempt by you or a family member Trouble swallowing Urges to engage in impulsive behaviors in ways that are unusual for you An unusual or allergic reaction to brexpiprazole, other medications, foods, dyes, or preservatives Pregnant or trying to get pregnant Breastfeeding How should I use this medication? Take this medication by mouth with water. Take it as directed on the prescription label at the same time every day. You can take it with or without food. If it upsets your stomach, take it with food. Keep taking this medication unless your care team tells you to stop. Stopping it too quickly can cause serious side effects. It can also make your condition worse. A special MedGuide will be given to you by the pharmacist with each prescription and refill. Be sure to read this  information carefully each time. Talk to your care team about the use of this medication in children. While it may be prescribed for children as young as 13 years for selected conditions, precautions do apply. Overdosage: If you think you have taken too much of this medicine contact a poison control center or emergency room at once. NOTE: This medicine is only for you. Do not share this medicine with others. What if I miss a dose? If you miss a dose, take it as soon as you can. If it is almost time for your next dose, take only that dose. Do not take double or extra doses. What may interact with this medication? Do not take this medication with any of the following: Aripiprazole Metoclopramide This medication may also interact with the following: Antihistamines for allergy, cough, and cold Certain medications for anxiety or sleep Certain medications for depression, such as amitriptyline, duloxetine, fluoxetine, paroxetine, sertraline Certain medications for fungal infections, such as fluconazole, itraconazole, ketoconazole Certain medications for Parkinson disease, such as levodopa Clarithromycin General anesthetics, such as halothane, isoflurane, methoxyflurane, propofol  Medications for blood pressure Medications that relax muscles for surgery Medications for seizures Opioid medications for pain Phenothiazines, such as chlorpromazine, prochlorperazine, thioridazine Quinidine Rifampin St. John's wort This list may not describe all possible interactions. Give your health care provider a list of all the medicines, herbs, non-prescription drugs, or dietary supplements you use. Also tell them if you smoke, drink alcohol, or use illegal drugs. Some items may interact with your medicine. What should I watch for while using this medication? Visit your care team for regular checks on your progress. Tell your care team if your symptoms do not start  to get better or if they get worse. Do not  suddenly stop taking this medication. You may develop a severe reaction. Your care team will tell you how much medication to take. If your care team wants you to stop the medication, the dose may be slowly lowered over time to avoid any side effects. This medication may cause thoughts of suicide or depression. This includes sudden changes in mood, behaviors, or thoughts. These changes can happen at any time but are more common in the beginning of treatment or after a change in dose. Call your care team right away if you experience these thoughts or worsening depression. This medication may affect your coordination, reaction time, or judgment. Do not drive or operate machinery until you know how this medication affects you. Sit up or stand slowly to reduce the risk of dizzy or fainting spells. Drinking alcohol with this medication can increase the risk of these side effects. There have been reports of increased sexual urges or other strong urges, such as gambling while taking this medication. If you experience any of these while taking this medication, you should report this to your care team as soon as possible. This medication may cause dry eyes and blurred vision. If you wear contact lenses, you may feel some discomfort. Lubricating eye drops may help. See your care team if the problem does not go away or is severe. This medication may increase blood sugar. Ask your care team if changes in diet or medications are needed if you have diabetes. This medication can cause problems with controlling your body temperature. It can lower the response of your body to cold temperatures. If possible, stay indoors during cold weather. If you must go outdoors, wear warm clothes. It can also lower the response of your body to heat. Do not overheat. Do not over-exercise. Stay out of the sun when possible. If you must be in the sun, wear cool clothing. Drink plenty of water. If you have trouble controlling your body  temperature, call your care team right away. What side effects may I notice from receiving this medication? Side effects that you should report to your care team as soon as possible: Allergic reactions--skin rash, itching, hives, swelling of the face, lips, tongue, or throat High blood sugar (hyperglycemia)--increased thirst or amount of urine, unusual weakness or fatigue, blurry vision High fever, stiff muscles, increased sweating, fast or irregular heartbeat, and confusion, which may be signs of neuroleptic malignant syndrome Infection--fever, chills, cough, sore throat Low blood pressure--dizziness, feeling faint or lightheaded, blurry vision Pain or trouble swallowing Seizures Stroke--sudden numbness or weakness of the face, arm, or leg, trouble speaking, confusion, trouble walking, loss of balance or coordination, dizziness, severe headache, change in vision Thoughts of suicide or self-harm, worsening mood, feelings of depression Uncontrolled and repetitive body movements, muscle stiffness or spasms, tremors or shaking, loss of balance or coordination, restlessness, shuffling walk, which may be signs of extrapyramidal symptoms (EPS) Urges to engage in impulsive behaviors such as gambling, binge eating, sexual activity, or shopping in ways that are unusual for you Side effects that usually do not require medical attention (report to your care team if they continue or are bothersome): Constipation Drowsiness Headache Weight gain This list may not describe all possible side effects. Call your doctor for medical advice about side effects. You may report side effects to FDA at 1-800-FDA-1088. Where should I keep my medication? Keep out of the reach of children and pets. Store at room temperature between  20 and 25 degrees C (68 and 77 degrees F). Get rid of any unused medication after the expiration date. To get rid of medications that are no longer needed or have expired: Take the medication  to a medication take-back program. Check with your pharmacy or law enforcement to find a location. If you cannot return the medication, check the label or package insert to see if the medication should be thrown out in the garbage or flushed down the toilet. If you are not sure, ask your care team. If it is safe to put it in the trash, take the medication out of the container. Mix the medication with cat litter, dirt, coffee grounds, or other unwanted substance. Seal the mixture in a bag or container. Put it in the trash. NOTE: This sheet is a summary. It may not cover all possible information. If you have questions about this medicine, talk to your doctor, pharmacist, or health care provider.  2024 Elsevier/Gold Standard (2022-06-25 00:00:00)

## 2023-10-30 ENCOUNTER — Other Ambulatory Visit: Payer: Self-pay | Admitting: Psychiatry

## 2023-10-30 DIAGNOSIS — G4701 Insomnia due to medical condition: Secondary | ICD-10-CM

## 2023-10-31 ENCOUNTER — Ambulatory Visit: Admitting: Family Medicine

## 2023-10-31 NOTE — Telephone Encounter (Signed)
 Requested Prescriptions  Pending Prescriptions Disp Refills   cyanocobalamin  (VITAMIN B12) 1000 MCG/ML injection [Pharmacy Med Name: CYANOCOBALAMIN  1,000 MCG/ML VL] 3 mL 0    Sig: INJECT 1 ML (1,000 MCG TOTAL) INTO THE MUSCLE EVERY 30 DAYS.     Endocrinology:  Vitamins - Vitamin B12 Passed - 10/31/2023  1:18 PM      Passed - HCT in normal range and within 360 days    HCT  Date Value Ref Range Status  09/18/2023 40.2 36.0 - 46.0 % Final   Hematocrit  Date Value Ref Range Status  06/25/2023 39.5 34.0 - 46.6 % Final         Passed - HGB in normal range and within 360 days    Hemoglobin  Date Value Ref Range Status  09/18/2023 13.4 12.0 - 15.0 g/dL Final  93/82/7974 86.9 11.1 - 15.9 g/dL Final         Passed - B12 Level in normal range and within 360 days    Vitamin B-12  Date Value Ref Range Status  09/18/2023 788 180 - 914 pg/mL Final    Comment:    (NOTE) This assay is not validated for testing neonatal or myeloproliferative syndrome specimens for Vitamin B12 levels. Performed at [facility], [address], [place], [place] [address]          Passed - Valid encounter within last 12 months    Recent Outpatient Visits           2 weeks ago Acute non-recurrent frontal sinusitis   [place] [place], Marsa PARAS, DO   1 month ago Rib pain   [place] [facility], [place] A, MD   2 months ago Hypotension, unspecified hypotension type   [facility] [place] Marsa PARAS, DO   6 months ago Annual physical exam   [place] [facility] [place] Marsa PARAS, DO   7 months ago Hypotension, unspecified hypotension type   [facility] [place], Marsa PARAS, DO       Future Appointments             In 1 month [place], Marsa PARAS, DO  [facility], [place]   In 2 months Perla, Timothy J, MD [facility] at [facility]

## 2023-11-01 ENCOUNTER — Ambulatory Visit: Admitting: Family Medicine

## 2023-11-05 ENCOUNTER — Other Ambulatory Visit: Payer: Self-pay | Admitting: Psychiatry

## 2023-11-05 DIAGNOSIS — F410 Panic disorder [episodic paroxysmal anxiety] without agoraphobia: Secondary | ICD-10-CM

## 2023-11-05 DIAGNOSIS — G4701 Insomnia due to medical condition: Secondary | ICD-10-CM

## 2023-11-06 ENCOUNTER — Ambulatory Visit: Admitting: Professional Counselor

## 2023-11-14 ENCOUNTER — Other Ambulatory Visit: Payer: Self-pay | Admitting: Family Medicine

## 2023-11-14 DIAGNOSIS — R110 Nausea: Secondary | ICD-10-CM

## 2023-11-15 ENCOUNTER — Other Ambulatory Visit: Payer: Self-pay | Admitting: Family Medicine

## 2023-11-15 DIAGNOSIS — J329 Chronic sinusitis, unspecified: Secondary | ICD-10-CM

## 2023-11-15 DIAGNOSIS — H6993 Unspecified Eustachian tube disorder, bilateral: Secondary | ICD-10-CM

## 2023-11-15 NOTE — Telephone Encounter (Signed)
 Requested medication (s) are due for refill today: na  Requested medication (s) are on the active medication list: yes   Last refill:  09/10/23 #30 2 refills  Future visit scheduled: yes in 1 month  Notes to clinic:  not delegated per protocol. Do you want to refill Rx?     Requested Prescriptions  Pending Prescriptions Disp Refills   ondansetron  (ZOFRAN -ODT) 4 MG disintegrating tablet [Pharmacy Med Name: ONDANSETRON  ODT 4 MG TABLET] 30 tablet 2    Sig: TAKE 1 TABLET BY MOUTH EVERY 8 HOURS AS NEEDED FOR NAUSEA AND VOMITING     Not Delegated - Gastroenterology: Antiemetics - ondansetron  Failed - 11/15/2023  1:57 PM      Failed - This refill cannot be delegated      Passed - AST in normal range and within 360 days    AST  Date Value Ref Range Status  09/18/2023 34 15 - 41 U/L Final         Passed - ALT in normal range and within 360 days    ALT  Date Value Ref Range Status  09/18/2023 21 0 - 44 U/L Final         Passed - Valid encounter within last 6 months    Recent Outpatient Visits           1 month ago Acute non-recurrent frontal sinusitis   [place] [facility] [place] Marsa PARAS, DO   2 months ago Rib pain   [place] [facility] [place] [place] A, MD   2 months ago Hypotension, unspecified hypotension type   [facility] [place] [facility]a PARAS, DO   6 months ago Annual physical exam   [place] [place] Marsa PARAS, DO   8 months ago Hypotension, unspecified hypotension type   [facility] [place], Marsa PARAS, DO       Future Appointments             In 1 month [place], Marsa PARAS, DO [facility], [place]   In 1 month Gollan, Timothy J, MD [facility]

## 2023-11-18 NOTE — Telephone Encounter (Signed)
 Requested Prescriptions  Pending Prescriptions Disp Refills   Azelastine  HCl 137 MCG/SPRAY SOLN [Pharmacy Med Name: AZELASTINE  0.1% (137 MCG) SPRY] 30 mL 0    Sig: PLACE 2 SPRAYS INTO BOTH NOSTRILS 2 (TWO) TIMES DAILY. USE IN EACH NOSTRIL AS DIRECTED     Ear, Nose, and Throat: Nasal Preparations - Antiallergy Passed - 11/18/2023 12:42 PM      Passed - Valid encounter within last 12 months    Recent Outpatient Visits           1 month ago Acute non-recurrent frontal sinusitis   [place] [facility] [place], Marsa PARAS, DO   2 months ago Rib pain   [facility] [place] A, MD   2 months ago Hypotension, unspecified hypotension type   [facility] [place] Marsa PARAS, DO   6 months ago Annual physical exam   [facility] [place] Marsa PARAS, DO   8 months ago Hypotension, unspecified hypotension type   [facility] [place], Marsa PARAS, DO       Future Appointments             In 1 month Edman, Marsa PARAS, DO [facility], [place]   In 1 month Gollan, Timothy J, MD [facility]

## 2023-11-19 ENCOUNTER — Ambulatory Visit: Admitting: Cardiovascular Disease

## 2023-11-20 ENCOUNTER — Ambulatory Visit: Admitting: Professional Counselor

## 2023-11-21 ENCOUNTER — Encounter: Payer: Self-pay | Admitting: Internal Medicine

## 2023-11-25 ENCOUNTER — Other Ambulatory Visit: Payer: Self-pay | Admitting: Psychiatry

## 2023-11-25 DIAGNOSIS — F3132 Bipolar disorder, current episode depressed, moderate: Secondary | ICD-10-CM

## 2023-12-11 ENCOUNTER — Encounter: Payer: Self-pay | Admitting: Cardiovascular Disease

## 2023-12-11 ENCOUNTER — Ambulatory Visit (INDEPENDENT_AMBULATORY_CARE_PROVIDER_SITE_OTHER): Admitting: Professional Counselor

## 2023-12-11 DIAGNOSIS — F410 Panic disorder [episodic paroxysmal anxiety] without agoraphobia: Secondary | ICD-10-CM | POA: Diagnosis not present

## 2023-12-11 DIAGNOSIS — F3132 Bipolar disorder, current episode depressed, moderate: Secondary | ICD-10-CM | POA: Diagnosis not present

## 2023-12-11 NOTE — Progress Notes (Signed)
  THERAPIST PROGRESS NOTE  Session Time: 9:00 AM - 9:59 AM   Participation Level: Active  Behavioral Response: Well Groomed, Alert, Anxious  Type of Therapy: Individual Therapy  Treatment Goals addressed: Active BH CCP BIPOLAR DISORDER  LTG: When I don't have to take all this medication. I just want to feel better, not feel so depressed, so anxious. I want to be able to have a normal day. Just not feeling anxious, depressed, other people have normal days, they look happy, laughing.                 Start:  07/29/23    Expected End:  07/27/24      STG: My daughter-in-law and I might get along one day. To improve relationships AEB self-report of improvement by using interpersonal effectiveness skills over the next 90 days.     STG: To not have this level of anxiousness I feel to not have panic attacks so often. To reduce sxs of anxiety AEB reduction of GAD7 scores by using coping mechanisms over the next 90 days.     STG: If I let that anger go and I get depressed and I can get so mad, I'll lose control. I just want to destroy everything. To improve emotion regulation AEB self-report reduction in outbursts by using coping mechanisms over the next 90 days.   ProgressTowards Goals: Progressing  Interventions: Motivational Interviewing, Conservator, Museum/gallery, and Supportive  Summary: Shoua Ulloa is a 68 y.o. female who presents with a history of panic disorder and bipolar disorder. She appeared alert and oriented x5. She reported she has scheduled a pod for moving. She plans to move at the end of the month. Rhema became anxious in session and initially thought it was due to talking about things but through exploration, it may be due to a lack of windows in the room. She noted a history of claustrophobia. Sharissa engaged in discussion about her marriage. She also noted family dynamics with her son and daughter-in-law. She wrote a portion of a letter to her daughter-in-law but hasn't  completed/sent it yet. She plans to write it out fully as she wishes to be able to have a relationship with her grandchildren.   Therapist Response: Conducted session with [employer]. Began session with check-in/update since previous session. Utilized empathetic and reflective listening. Used open-ended questions to facilitate discussion and summarized Indya's thoughts/feelings. Explored triggers for anxiety. Engaged in discussion about family dynamics and encouraged change talk around making amends with daughter-in-law. Scheduled additional appointment and concluded session.   Suicidal/Homicidal: No  Plan: Return again in 2 weeks.  Diagnosis: Panic disorder  Bipolar 1 disorder, depressed, moderate (HCC)  Collaboration of Care: Medication Management AEB chart review  Patient/Guardian was advised Release of Information must be obtained prior to any record release in order to collaborate their care with an outside provider. Patient/Guardian was advised if they have not already done so to contact the registration department to sign all necessary forms in order for us  to release information regarding their care.   Consent: Patient/Guardian gives verbal consent for treatment and assignment of benefits for services provided during this visit. Patient/Guardian expressed understanding and agreed to proceed.   Almarie JONETTA Ligas, [facility] 12/11/2023

## 2023-12-12 DIAGNOSIS — G894 Chronic pain syndrome: Secondary | ICD-10-CM | POA: Diagnosis not present

## 2023-12-12 DIAGNOSIS — M48061 Spinal stenosis, lumbar region without neurogenic claudication: Secondary | ICD-10-CM | POA: Diagnosis not present

## 2023-12-12 DIAGNOSIS — M791 Myalgia, unspecified site: Secondary | ICD-10-CM | POA: Diagnosis not present

## 2023-12-12 DIAGNOSIS — M533 Sacrococcygeal disorders, not elsewhere classified: Secondary | ICD-10-CM | POA: Diagnosis not present

## 2023-12-12 DIAGNOSIS — M47896 Other spondylosis, lumbar region: Secondary | ICD-10-CM | POA: Diagnosis not present

## 2023-12-12 DIAGNOSIS — M5412 Radiculopathy, cervical region: Secondary | ICD-10-CM | POA: Diagnosis not present

## 2023-12-12 DIAGNOSIS — Z981 Arthrodesis status: Secondary | ICD-10-CM | POA: Diagnosis not present

## 2023-12-12 DIAGNOSIS — Z79891 Long term (current) use of opiate analgesic: Secondary | ICD-10-CM | POA: Diagnosis not present

## 2023-12-12 DIAGNOSIS — M461 Sacroiliitis, not elsewhere classified: Secondary | ICD-10-CM | POA: Diagnosis not present

## 2023-12-12 DIAGNOSIS — Z79899 Other long term (current) drug therapy: Secondary | ICD-10-CM | POA: Diagnosis not present

## 2023-12-12 DIAGNOSIS — M542 Cervicalgia: Secondary | ICD-10-CM | POA: Diagnosis not present

## 2023-12-16 ENCOUNTER — Other Ambulatory Visit: Payer: Self-pay | Admitting: Family Medicine

## 2023-12-16 DIAGNOSIS — J329 Chronic sinusitis, unspecified: Secondary | ICD-10-CM

## 2023-12-16 DIAGNOSIS — H6993 Unspecified Eustachian tube disorder, bilateral: Secondary | ICD-10-CM

## 2023-12-17 ENCOUNTER — Other Ambulatory Visit: Payer: Self-pay

## 2023-12-17 ENCOUNTER — Ambulatory Visit: Admitting: Psychiatry

## 2023-12-17 ENCOUNTER — Encounter: Payer: Self-pay | Admitting: Psychiatry

## 2023-12-17 VITALS — BP 120/64 | HR 76 | Temp 97.8°F | Ht 62.0 in | Wt 149.2 lb

## 2023-12-17 DIAGNOSIS — F410 Panic disorder [episodic paroxysmal anxiety] without agoraphobia: Secondary | ICD-10-CM

## 2023-12-17 DIAGNOSIS — F3176 Bipolar disorder, in full remission, most recent episode depressed: Secondary | ICD-10-CM

## 2023-12-17 DIAGNOSIS — G4701 Insomnia due to medical condition: Secondary | ICD-10-CM | POA: Diagnosis not present

## 2023-12-17 DIAGNOSIS — F4310 Post-traumatic stress disorder, unspecified: Secondary | ICD-10-CM | POA: Diagnosis not present

## 2023-12-17 MED ORDER — OXCARBAZEPINE 300 MG PO TABS: 300.0000 mg | ORAL_TABLET | Freq: Every day | ORAL | Status: AC

## 2023-12-17 NOTE — Patient Instructions (Signed)
 Please call for [phone]

## 2023-12-17 NOTE — Progress Notes (Unsigned)
 BH MD OP Progress Note  12/17/2023 11:59 AM Ashley Fields  MRN:  [id number]  Chief Complaint:  Chief Complaint  Patient presents with   Medication Refill   Follow-up   Anxiety   Discussed the use of AI scribe software for clinical note transcription with the patient, who gave verbal consent to proceed.  History of Present Illness Ashley Fields is a 68 year old Caucasian female, married, disability, lives in [place], has a history of bipolar disorder, PTSD, panic disorder, insomnia, chronic pain, migraine headaches, history of laparoscopic gastric bypass was evaluated in office today for a follow-up appointment.  Significant stress related to her upcoming move to [place]  affects her, as she cites ongoing logistical challenges with her new residence and financial concerns. She feels overwhelmed by the process, including delays in home renovations and uncertainty about where she will stay until the move is complete. She tries to avoid conflict with her son, who assists with the move, and expresses frustration about the situation.  Recent family losses, including the death of her stepbrother and her cousin's son suffering a stroke, add to her emotional strain. She tries to keep her body from shaking internally and feels that these events have contributed to her stress over the past few months.  During times of heightened stress, she finds that talking to her therapist helps her unwind.  Ongoing marital difficulties persist, as she and her husband have not been physically intimate since his prostate cancer diagnosis, and he has been sleeping in a separate bedroom for a couple of years. Her husband now considers selling their farm and moving to [place] , but she expresses ambivalence about him joining her and notes that her son does not want her husband involved in his life or around his children.  She takes trazodone  for sleep and states that she is able to sleep. She continues to take oxcarbazepine   and has reduced the dose to twice daily due to issues with prescription approval and supply. She also takes gabapentin , which she receives from primary provider for pain management.  She is aware that gabapentin  is also a mood stabilizer and may help with anxiety symptoms.  She has had difficulty obtaining Rexulti  from the pharmacy, which has led her to run out, and she has not been able to refill her prescription due to supply issues. She previously received a partial prescription and awaits a refill that is not due until the fourteenth of the month. She expresses a desire to resume Rexulti .  She denies any thoughts of hurting herself or others.    Visit Diagnosis:    ICD-10-CM   1. Bipolar disorder, in full remission, most recent episode depressed  F31.76    Type I    2. PTSD (post-traumatic stress disorder)  F43.10     3. Panic disorder  F41.0     4. Insomnia due to medical condition  G47.01    mood , lack of sleep hygiene, pain      Past Psychiatric History: I have reviewed past psychiatric history from progress note on 05/15/2018.  Past trials of Prozac, Wellbutrin, Lexapro, Cymbalta, Rexulti , Ambien -nightmares, Lunesta , Lamictal , risperidone .  Past Medical History:  Past Medical History:  Diagnosis Date   Abdominal wall seroma (postoperative) 2025   Allergy    Anxiety    Aortic atherosclerosis    Arthritis    Back pain    a.) s/p L4-L5 fusion 2008; b.) s/p PLIF L2-L3 and ALIF L5-S1 12/2021   Bilateral carotid artery disease  Bipolar disorder (HCC)    Blood transfusion without reported diagnosis 1977   Transfusions from miscarriage & hemorrhaging   CAD (coronary artery disease)    Chronic kidney disease    DDD (degenerative disc disease), cervical    a.) s/p ACDF C4-C7   Depression    Disp fx of cuboid bone of right foot, init for clos fx 01/01/2017   Displacement of breast implant 2025   Fibromyalgia    GERD (gastroesophageal reflux disease)    Headache     Hepatitis C virus infection cured after antiviral drug therapy    a.) s/p Tx with ledipasvir/sofosbuvir   History of 2019 novel coronavirus disease (COVID-19) 01/23/2020   Hollenhorst plaque, right eye    Hyperlipidemia    Hypoglycemia    Insomnia    a.) uses trazodone  PRN   Iron  deficiency anemia following bariatric surgery    Long-term use of aspirin  therapy    Lumbar radiculopathy    Neck pain    Neuromuscular disorder (HCC) 1997   Falling to much was an eye-opener   Osteoporosis    PSVT (paroxysmal supraventricular tachycardia)    Systolic murmur    Thyroid  disease    Urine incontinence     Past Surgical History:  Procedure Laterality Date   5 miscarriages     1977-1985, Blood transfusion s/p miscarriage 1977   ABDOMINAL EXPOSURE N/A 12/18/2021   Procedure: ABDOMINAL EXPOSURE;  Surgeon: Gretta Lonni PARAS, MD;  Location: [facility] OR;  Service: Vascular;  Laterality: N/A;   ACDF C4-C7  02/10/2015   ANTERIOR LUMBAR FUSION N/A 12/18/2021   Procedure: Anterior Lumbar Interbody Fusion  - Lumbar Five-Sacral One;  Surgeon: Joshua Alm RAMAN, MD;  Location: [facility] OR;  Service: Neurosurgery;  Laterality: N/A;   APPENDECTOMY  12/2008   AUGMENTATION MAMMAPLASTY Bilateral 2015   AUGMENTATION MAMMAPLASTY Bilateral 2018   implants redone w/ placement of implants under muscle   breast lift bilateral, implants  05/18/2015   bilateral, silicon naturel   BREAST REDUCTION SURGERY Bilateral 1997   CARDIOVERSION     2025   CESAREAN SECTION  07/27/1981   Placenta Previa   COLONOSCOPY WITH PROPOFOL  N/A 06/13/2020   Procedure: COLONOSCOPY WITH PROPOFOL ;  Surgeon: Janalyn Keene NOVAK, MD;  Location: [facility] ENDOSCOPY;  Service: Endoscopy;  Laterality: N/A;   COSMETIC SURGERY     Breast implants w/ lift, tummy tuck, upper & lower Blepharop   DILATION AND CURETTAGE OF UTERUS     ESOPHAGOGASTRODUODENOSCOPY (EGD) WITH PROPOFOL  N/A 06/13/2020   Procedure: ESOPHAGOGASTRODUODENOSCOPY (EGD) WITH PROPOFOL ;   Surgeon: Janalyn Keene NOVAK, MD;  Location: [facility] ENDOSCOPY;  Service: Endoscopy;  Laterality: N/A;   EXCISION OF ABDOMINAL WALL TUMOR N/A 04/08/2023   Procedure: EXCISION, NEOPLASM, ABDOMINAL WALL;  Surgeon: Rodolph Romano, MD;  Location: [facility] ORS;  Service: General;  Laterality: N/A;   EYE SURGERY  2017   Cataract surgery & lasik   GASTRIC BYPASS  12/24/2003   IVC FILTER INSERTION  12/2003   TrapEase Vena Cava Filter   LAPAROSCOPIC CHOLECYSTECTOMY N/A 03/2004   LEFT HEART CATH AND CORONARY ANGIOGRAPHY Left 07/02/2023   Procedure: LEFT HEART CATH AND CORONARY ANGIOGRAPHY;  Surgeon: Mady Lonni, MD;  Location: [facility] INVASIVE CV LAB;  Service: Cardiovascular;  Laterality: Left;   LUMBAR LAMINECTOMY  06/2006   L4-L5 (spinal fusion)   mini tummy tuck  05/07/2016   Bilateral bra/back roll lift skin removal   REDUCTION MAMMAPLASTY Bilateral 1997   SHOULDER ACROMIOPLASTY Left 03/27/2013   w/ labral  debridement   SPINAL CORD STIMULATOR IMPLANT  11/2010   removed 05/2011   TOTAL ABDOMINAL HYSTERECTOMY N/A 1987    Family Psychiatric History: I have reviewed family psychiatric history from progress note on 05/15/2018.  Family History:  Family History  Problem Relation Age of Onset   COPD Mother    Lung cancer Mother 62   Diabetes Mother    Heart disease Mother    Stroke Mother    Heart attack Mother    Arthritis Mother    Hypertension Mother    Obesity Mother    Varicose Veins Mother    Heart disease Father    Heart attack Father 59   Early death Father    Depression Sister        x 5 sisters   COPD Sister    Hypertension Sister    Colon polyps Sister    Hearing loss Sister    Heart attack Sister    Arthritis Sister    Varicose Veins Sister    COPD Sister    Obesity Sister    Depression Sister    Depression Sister    Heart disease Sister    Hypertension Sister    Hypertension Sister    Obesity Sister    Heart disease Brother        x 3 brothers   Diabetes  Brother    Heart attack Brother    Arthritis Brother    Heart disease Brother    Hypertension Brother    Diabetes Brother    Heart disease Brother    Hypertension Brother    Obesity Brother    Varicose Veins Brother    Heart disease Brother    Hypertension Brother    Obesity Brother    Miscarriages / Stillbirths Maternal Aunt    Miscarriages / Stillbirths Paternal Aunt    Breast cancer Paternal Aunt        early years   Cancer Maternal Grandfather    Stroke Maternal Grandfather     Social History: I have reviewed social history from progress note on 05/15/2018. Social History   Socioeconomic History   Marital status: Married    Spouse name: Kiani, Wurtzel (Spouse) [phone] ([place])   Number of children: 1   Years of education: High School   Highest education level: GED or equivalent  Occupational History   Occupation: disability  Tobacco Use   Smoking status: Former    Current packs/day: 0.00    Average packs/day: 0.3 packs/day for 10.0 years (2.5 ttl pk-yrs)    Types: Cigarettes    Start date: 11/28/1985    Quit date: 11/29/1995    Years since quitting: 28.0    Passive exposure: Past   Smokeless tobacco: Former   Tobacco comments:    Quite 1997, didnt smoke hardly at all when I was smoking.  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]   Social Drivers of Health   Financial Resource Strain: Medium Risk (12/16/2023)   Overall Financial Resource Strain (CARDIA)    Difficulty of Paying Living Expenses: Somewhat hard  Food Insecurity: No Food Insecurity (12/16/2023)  Hunger Vital Sign    Worried About Running  Out of Food in the Last Year: Never true    Ran Out of Food in the Last Year: Never true  Transportation Needs: No Transportation Needs (12/16/2023)   PRAPARE - Administrator, Civil Service (Medical): No    Lack of Transportation (Non-Medical): No  Physical Activity: Insufficiently Active (12/16/2023)   Exercise Vital Sign    Days of Exercise per Week: 7 days    Minutes of Exercise per Session: 20 min  Stress: Stress Concern Present (12/16/2023)   [employer] of Occupational Health - Occupational Stress Questionnaire    Feeling of Stress: Very much  Social Connections: Moderately Isolated (12/16/2023)   Social Connection and Isolation Panel    Frequency of Communication with Friends and Family: Three times a week    Frequency of Social Gatherings with Friends and Family: Once a week    Attends Religious Services: Never    Database Administrator or Organizations: No    Attends Engineer, Structural: Not on file    Marital Status: Married    Allergies:  Allergies  Allergen Reactions   Flagyl [Metronidazole] Anaphylaxis   Cymbalta [Duloxetine Hcl] Anxiety   Tape Rash    Paper tape okay to use per patient.    Metabolic Disorder Labs: Lab Results  Component Value Date   HGBA1C 5.1 04/24/2023   MPG 100 04/24/2023   MPG 108 05/01/2022   No results found for: PROLACTIN Lab Results  Component Value Date   CHOL 136 04/24/2023   TRIG 59 04/24/2023   HDL 76 04/24/2023   CHOLHDL 1.8 04/24/2023   LDLCALC 46 04/24/2023   LDLCALC 43 05/01/2022   Lab Results  Component Value Date   TSH 1.61 04/24/2023   TSH 2.11 05/01/2022    Therapeutic Level Labs: No results found for: LITHIUM No results found for: VALPROATE No results found for: CBMZ  Current Medications: Current Outpatient Medications  Medication Sig Dispense Refill   aspirin  EC (ASPIRIN  LOW DOSE) 81 MG tablet TAKE 1 TABLET BY MOUTH EVERY DAY 90 tablet 0   Azelastine  HCl 137 MCG/SPRAY  SOLN PLACE 2 SPRAYS INTO BOTH NOSTRILS 2 (TWO) TIMES DAILY. USE IN EACH NOSTRIL AS DIRECTED 30 mL 0   bisacodyl  (DULCOLAX) 5 MG EC tablet Take 5 mg by mouth daily as needed for moderate constipation.     brexpiprazole  (REXULTI ) 0.25 MG TABS tablet TAKE 1 TABLET BY MOUTH EVERY DAY 90 tablet 0   butalbital -acetaminophen -caffeine  (FIORICET) 50-325-40 MG tablet Take 1 tablet by mouth 2 (two) times daily as needed for headache.     CALCIUM  PO Take 1 tablet by mouth 2 (two) times daily. Mini     Cholecalciferol 250 MCG (10000 UT) CAPS Take 10,000 Units by mouth once a week.     cyanocobalamin  (VITAMIN B12) 1000 MCG/ML injection INJECT 1 ML (1,000 MCG TOTAL) INTO THE MUSCLE EVERY 30 DAYS. 3 mL 0   diclofenac Sodium (VOLTAREN) 1 % GEL Apply 2 g topically 4 (four) times daily as needed (pain).     estradiol  (ESTRACE ) 1 MG tablet TAKE 1 TABLET BY MOUTH EVERY DAY 90 tablet 1   etodolac  (LODINE ) 500 MG tablet Take 1 tablet by mouth 2 (two) times daily.     Evolocumab  (REPATHA  SURECLICK) 140 MG/ML SOAJ Inject 140 mg into the skin every 14 (fourteen) days. 6 mL 3   ezetimibe  (ZETIA ) 10 MG tablet TAKE 1 TABLET BY MOUTH EVERY  DAY 90 tablet 3   fexofenadine  (ALLEGRA ) 180 MG tablet Take 1 tablet (180 mg total) by mouth daily.     fluticasone  (FLONASE ) 50 MCG/ACT nasal spray SPRAY 2 SPRAYS INTO EACH NOSTRIL EVERY DAY 48 mL 0   furosemide  (LASIX ) 20 MG tablet TAKE 1 TABLET ONE TIME DAILY AS NEEDED FOR FLUID OR EDEMA (Patient not taking: Reported on 10/29/2023) 90 tablet 1   gabapentin  (NEURONTIN ) 600 MG tablet Take 1 tablet (600 mg total) by mouth in the morning, at noon, in the evening, and at bedtime. 360 tablet 3   Multiple Vitamins-Minerals (MULTIVITAMIN WITH MINERALS) tablet Take 1 tablet by mouth daily.     omeprazole  (PRILOSEC) 40 MG capsule Take 1 capsule (40 mg total) by mouth daily. 90 capsule 3   ondansetron  (ZOFRAN -ODT) 4 MG disintegrating tablet TAKE 1 TABLET BY MOUTH EVERY 8 HOURS AS NEEDED FOR NAUSEA  AND VOMITING 30 tablet 2   Oxcarbazepine  (TRILEPTAL ) 300 MG tablet TAKE 2.5 TABS BY MOUTH AS DIRECTED. TAKE 1 TABLET DAILY AT 8 AM AND HALF TABLET AT NOON AND 1 TABLET DAILY AT 8 PM 75 tablet 2   oxyCODONE -acetaminophen  (PERCOCET) 7.5-325 MG tablet Take 1 tablet by mouth every 6 (six) hours.     RESTASIS 0.05 % ophthalmic emulsion Place 1 drop into both eyes 2 (two) times daily.     rizatriptan  (MAXALT -MLT) 10 MG disintegrating tablet Take 1 tablet (10 mg total) by mouth as needed for migraine. May repeat in 2 hours if needed, that is max dose for 24 hours. 10 tablet 3   rosuvastatin  (CRESTOR ) 40 MG tablet TAKE 1 TABLET BY MOUTH EVERY DAY 90 tablet 3   tiZANidine  (ZANAFLEX ) 4 MG tablet Take 1 tablet (4 mg total) by mouth 4 (four) times daily. 120 tablet 5   traZODone  (DESYREL ) 100 MG tablet Take 1-2 tablets (100-200 mg total) by mouth at bedtime as needed for sleep. 180 tablet 1   triamcinolone  ointment (KENALOG ) 0.5 % APPLY 1 APPLICATION TOPICALLY 2 (TWO) TIMES DAILY. APPLY TO EXTERNAL EAR FOR 2 WEEKS 30 g 2   valACYclovir  (VALTREX ) 1000 MG tablet TAKE 1 TABLET (1,000 MG TOTAL) BY MOUTH DAILY. FOR 5-7 DAYS, MAY REPEAT IF NEED FOR COLD SORE 90 tablet 1   venlafaxine  XR (EFFEXOR -XR) 150 MG 24 hr capsule TAKE 1 CAPSULE (150 MG TOTAL) BY MOUTH DAILY WITH BREAKFAST. DOSE INCREASE 90 capsule 1   Zoledronic  Acid (ZOMETA ) 4 MG/100ML IVPB Once per year from Orthopedic 100 mL    No current facility-administered medications for this visit.     Musculoskeletal: Strength & Muscle Tone: within normal limits Gait & Station: normal Patient leans: N/A  Psychiatric Specialty Exam: Review of Systems  Psychiatric/Behavioral:  The patient is nervous/anxious.     Blood pressure 120/64, pulse 76, temperature 97.8 F (36.6 C), temperature source Temporal, height 5' 2 (1.575 m), weight 149 lb 3.2 oz (67.7 kg).Body mass index is 27.29 kg/m.  General Appearance: Casual  Eye Contact:  Fair  Speech:  Clear and  Coherent  Volume:  Normal  Mood:  Anxious  Affect:  Full Range  Thought Process:  Goal Directed and Descriptions of Associations: Intact  Orientation:  Full (Time, Place, and Person)  Thought Content: Logical   Suicidal Thoughts:  No  Homicidal Thoughts:  No  Memory:  Immediate;   Fair Recent;   Fair Remote;   Fair  Judgement:  Fair  Insight:  Fair  Psychomotor Activity:  Normal  Concentration:  Concentration: Fair  and Attention Span: Fair  Recall:  [employer] of Knowledge: Fair  Language: Fair  Akathisia:  No  Handed:  Right  AIMS (if indicated): Denies any abnormal movements, tremors, muscle spasms  Assets:  Communication Skills Desire for Improvement Housing Talents/Skills Transportation  ADL's:  Intact  Cognition: WNL  Sleep:  Improving   Screenings: Geneticist, Molecular Office Visit from 10/29/2023 in [facility] Video Visit from 06/13/2021 in [facility] Video Visit from 05/16/2021 in [facility]  AIMS Total Score 0 0 0   GAD-7    Flowsheet Row Office Visit from 10/29/2023 in [facility] Counselor from 06/19/2023 in [facility] Office Visit from 03/14/2023 in [facility] Office Visit from 10/31/2022 in [facility] Office Visit from 10/15/2022 in [facility]  Total GAD-7 Score 8 12 3 4 7    EYV7-0    Flowsheet Row Office Visit from 10/29/2023 in [facility] Counselor from 06/19/2023 in [facility] Office Visit from 03/14/2023 in [facility] Office Visit from 10/31/2022 in [facility] Office Visit from 10/15/2022 in [facility]  PHQ-2 Total Score 1 6 1 2 2   PHQ-9 Total Score 7 17 3 3 3    Flowsheet Row Office Visit from 10/29/2023 in [facility] ED from 08/25/2023 in [facility] at [facility] Video Visit from 07/22/2023 in [facility]  C-SSRS RISK CATEGORY No Risk No Risk No Risk     Assessment and Plan: Ashley Fields is a 68 year old Caucasian female who presented for follow-up appointment, discussed assessment and plan as noted below.  1. Bipolar disorder, in full remission, most recent episode depressed Currently denies any significant depression or manic symptoms.  Ran out of Rexulti  due to issues with pharmacy however agrees to get in touch with pharmacy to figure this out. Continue Rexulti  0.25 mg daily Taper off Trileptal , currently weaning off due to cost Take Trileptal  300 mg daily for a week, take 300 mg every other day for another week and stop taking it. Continue Gabapentin  600 mg 4 times a day.  Prescribed by primary care provider for pain although it is a mood stabilizer as well.  2. PTSD (post-traumatic stress disorder)-improving Currently denies any significant nightmares and reports sleep is improving Continue psychotherapy sessions with Ms. Almarie Ligas Continue venlafaxine  150 mg daily Continue trazodone  200 mg at bedtime  3. Panic disorder-improving Currently reports anxiety symptoms as due to recent situational stressors and therapy as beneficial. Continue venlafaxine  150 mg daily Continue hydroxyzine  50 mg twice a day as needed Continue CBT  4. Insomnia due to medical condition-improving Currently reports sleep is improving Continue trazodone  200 mg at bedtime Continue sufficient pain management  Follow-up Follow-up in clinic as needed.  Patient to establish care with a new provider in [place]  if she relocates soon.  Collaboration of Care:  Collaboration of Care: Referral or follow-up with counselor/therapist AEB  encouraged to continue psychotherapy sessions in the meantime.  Patient/Guardian was advised Release of Information must be obtained prior to any record release in order to collaborate their care with an outside provider. Patient/Guardian was advised if they have not already done  so to contact the registration department to sign all necessary forms in order for us  to release information regarding their care.   Consent: Patient/Guardian gives verbal consent for treatment and assignment of benefits for services provided during this visit. Patient/Guardian expressed understanding and agreed to proceed.  This note was generated in part or whole with voice recognition software. Voice recognition is usually quite accurate but there are transcription errors that can and very often do occur. I apologize for any typographical errors that were not detected and corrected.     Ashley Langelier, MD 12/17/2023, 11:59 AM

## 2023-12-17 NOTE — Telephone Encounter (Signed)
 Too soon for refill.  Requested Prescriptions  Pending Prescriptions Disp Refills   Azelastine  HCl 137 MCG/SPRAY SOLN [Pharmacy Med Name: AZELASTINE  0.1% (137 MCG) SPRY]      Sig: PLACE 2 SPRAYS INTO BOTH NOSTRILS 2 (TWO) TIMES DAILY. USE IN EACH NOSTRIL AS DIRECTED     Ear, Nose, and Throat: Nasal Preparations - Antiallergy Passed - 12/17/2023  3:50 PM      Passed - Valid encounter within last 12 months    Recent Outpatient Visits           2 months ago Acute non-recurrent frontal sinusitis   [facility] [place], Marsa PARAS, DO   3 months ago Rib pain   [facility], [place] A, MD   3 months ago Hypotension, unspecified hypotension type   [facility] [place] Marsa PARAS, DO   7 months ago Annual physical exam   [facility] [place] Marsa PARAS, DO   9 months ago Hypotension, unspecified hypotension type   [facility] [place], Marsa PARAS, DO       Future Appointments             In 2 days Ed[place], Marsa PARAS, DO [facility], [place]   In 3 weeks Perla, Timothy J, MD [facility]

## 2023-12-19 ENCOUNTER — Encounter: Payer: Self-pay | Admitting: Family Medicine

## 2023-12-19 ENCOUNTER — Ambulatory Visit (INDEPENDENT_AMBULATORY_CARE_PROVIDER_SITE_OTHER): Admitting: Family Medicine

## 2023-12-19 ENCOUNTER — Telehealth: Payer: Self-pay | Admitting: Psychiatry

## 2023-12-19 ENCOUNTER — Other Ambulatory Visit: Payer: Self-pay | Admitting: Family Medicine

## 2023-12-19 VITALS — BP 120/68 | HR 69 | Ht 62.0 in | Wt 149.0 lb

## 2023-12-19 DIAGNOSIS — Z79899 Other long term (current) drug therapy: Secondary | ICD-10-CM | POA: Diagnosis not present

## 2023-12-19 DIAGNOSIS — F4310 Post-traumatic stress disorder, unspecified: Secondary | ICD-10-CM | POA: Diagnosis not present

## 2023-12-19 DIAGNOSIS — R7309 Other abnormal glucose: Secondary | ICD-10-CM

## 2023-12-19 DIAGNOSIS — E538 Deficiency of other specified B group vitamins: Secondary | ICD-10-CM

## 2023-12-19 DIAGNOSIS — I10 Essential (primary) hypertension: Secondary | ICD-10-CM

## 2023-12-19 DIAGNOSIS — F3176 Bipolar disorder, in full remission, most recent episode depressed: Secondary | ICD-10-CM | POA: Diagnosis not present

## 2023-12-19 DIAGNOSIS — E559 Vitamin D deficiency, unspecified: Secondary | ICD-10-CM

## 2023-12-19 DIAGNOSIS — I739 Peripheral vascular disease, unspecified: Secondary | ICD-10-CM | POA: Diagnosis not present

## 2023-12-19 DIAGNOSIS — B379 Candidiasis, unspecified: Secondary | ICD-10-CM

## 2023-12-19 DIAGNOSIS — E782 Mixed hyperlipidemia: Secondary | ICD-10-CM | POA: Diagnosis not present

## 2023-12-19 DIAGNOSIS — Z0000 Encounter for general adult medical examination without abnormal findings: Secondary | ICD-10-CM

## 2023-12-19 DIAGNOSIS — Z9189 Other specified personal risk factors, not elsewhere classified: Secondary | ICD-10-CM | POA: Diagnosis not present

## 2023-12-19 DIAGNOSIS — J0110 Acute frontal sinusitis, unspecified: Secondary | ICD-10-CM

## 2023-12-19 MED ORDER — FLUCONAZOLE 150 MG PO TABS: ORAL_TABLET | ORAL | 0 refills | Status: AC

## 2023-12-19 MED ORDER — AMOXICILLIN-POT CLAVULANATE 875-125 MG PO TABS: 1.0000 | ORAL_TABLET | Freq: Two times a day (BID) | ORAL | 0 refills | Status: AC

## 2023-12-19 NOTE — Telephone Encounter (Signed)
 I have reviewed the EKG dated 12/19/2023-ventricular rate-69 bpm.  QTc-384.  Normal ECG.

## 2023-12-19 NOTE — Patient Instructions (Addendum)
 Thank you for coming to the office today.  EKG today for QTc monitoring, will fax to Dr Coby when completed  DUE for FASTING BLOOD WORK (no food or drink after midnight before the lab appointment, only water  or coffee without cream/sugar on the morning of)  SCHEDULE Lab Only visit in the morning at the clinic for lab draw in 5 MONTHS   - Make sure Lab Only appointment is at about 1 week before your next appointment, so that results will be available  For Lab Results, once available within 2-3 days of blood draw, you can can log in to MyChart online to view your results and a brief explanation. Also, we can discuss results at next follow-up visit.   Please schedule a Follow-up Appointment to: Return in about 5 months (around 05/18/2024) for 5 month fasting lab > 1 week later Annual Physical.  If you have any other questions or concerns, please feel free to call the office or send a message through MyChart. You may also schedule an earlier appointment if necessary.  Additionally, you may be receiving a survey about your experience at our office within a few days to 1 week by e-mail or mail. We value your feedback.  Marsa Officer, DO [facility], [place]

## 2023-12-19 NOTE — Progress Notes (Signed)
 Subjective:    Patient ID: Ashley Fields, female    DOB: 02/06/1955, 68 y.o.   MRN: [id number]  Ashley Fields is a 68 y.o. female presenting on 12/19/2023 for Medical Management of Chronic Issues (Bipolar Disorder, due for EKG for eval of QTc)   HPI  Discussed the use of AI scribe software for clinical note transcription with the patient, who gave verbal consent to proceed.  History of Present Illness   Ashley Fields is a 68 year old female who presents for a six-month follow-up to address updates on her anemia, thyroid  nodule, and other care gaps.  Updates Thyroid  nodule and thyroid  function - Thyroid  nodule previously measured less than 7 mm. - No history of levothyroxine use, despite it being listed in her medical history. - Thyroid  function tests were normal earlier this year.  Anemia Followed by Dr Rennie No new concerns  Hyperlipidemia management Coronary Artery Disease Followed by Cardiology - Currently managing cholesterol with Repatha  injections, rosuvastatin , and Zetia . - No cholesterol panel obtained since starting Repatha .  Blood pressure abnormalities - Blood pressure readings sometimes as low as 80/60 mmHg. - Declined metoprolol  due to concerns about hypotension. - Scheduled to see cardiologist in January.  Bipolar Disorder / PTSD Psychiatric medication monitoring - Currently taking Rexulti , started in October. - Psychiatrist requested EKG to monitor QTC interval due to potential medication effects. 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  Mental health follow-up - Therapy appointments scheduled.          12/19/2023    5:40 PM 12/17/2023   12:05 PM 10/29/2023    4:00 PM  Depression screen PHQ 2/9  Decreased Interest 1 1 1   Down, Depressed, Hopeless 1 1 0  PHQ - 2 Score 2 2 1   Altered sleeping 0 0 1  Tired, decreased energy 1 1 2   Change in appetite 1 1 1   Feeling bad or failure about yourself  1 1 0  Trouble  concentrating 1 1 1   Moving slowly or fidgety/restless 1 1 1   Suicidal thoughts 0 0 0  PHQ-9 Score 7 7 7    Difficult doing work/chores Somewhat difficult  Somewhat difficult     Data saved with a previous flowsheet row definition       12/17/2023   12:05 PM 10/29/2023    4:00 PM 06/19/2023   10:04 AM 03/14/2023   11:44 AM  GAD 7 : Generalized Anxiety Score  Nervous, Anxious, on Edge 1 2 3 1   Control/stop worrying 1 1 3  0  Worry too much - different things 1 1 1  0  Trouble relaxing 1 1 1 1   Restless 0 1 1 0  Easily annoyed or irritable 1 1 3 1   Afraid - awful might happen 1 1 0 0  Total GAD 7 Score 6 8 12 3   Anxiety Difficulty  Somewhat difficult Extremely difficult Not difficult at all    Social History[1]  Review of Systems Per HPI unless specifically indicated above     Objective:    BP 120/68 (BP Location: Left Arm, Patient Position: Sitting, Cuff Size: Normal)   Pulse 69   Ht 5' 2 (1.575 m)   Wt 149 lb (67.6 kg)   SpO2 96%   BMI 27.25 kg/m   Wt Readings from Last 3 Encounters:  12/19/23 149 lb (67.6 kg)  12/17/23 149 lb 3.2 oz (67.7 kg)  10/29/23 142 lb 12.8 oz (64.8 kg)    Physical Exam  Vitals and nursing note reviewed.  Constitutional:      General: She is not in acute distress.    Appearance: Normal appearance. She is well-developed. She is not diaphoretic.     Comments: Well-appearing, comfortable, cooperative  HENT:     Head: Normocephalic and atraumatic.  Eyes:     General:        Right eye: No discharge.        Left eye: No discharge.     Conjunctiva/sclera: Conjunctivae normal.  Cardiovascular:     Rate and Rhythm: Normal rate.  Pulmonary:     Effort: Pulmonary effort is normal.  Skin:    General: Skin is warm and dry.     Findings: No erythema or rash.  Neurological:     Mental Status: She is alert and oriented to person, place, and time.  Psychiatric:        Mood and Affect: Mood normal.        Behavior: Behavior normal.         Thought Content: Thought content normal.     Comments: Well groomed, good eye contact, normal speech and thoughts     EKG - performed in office today  Date: 12/19/23  Rate: 69  Rhythm: normal sinus rhythm  QRS Axis: normal  Intervals: normal  ST/T Wave abnormalities: normal  Conduction Disutrbances:none  Additional Narrative Interpretation: QTc 384 compared to prior EKG 443 10/08/23  Old EKG Reviewed: unchanged    Results for orders placed or performed in visit on 10/16/23  POC COVID-19 BinaxNow   Collection Time: 10/16/23 10:02 AM  Result Value Ref Range   SARS Coronavirus 2 Ag Negative Negative   *Note: Due to a large number of results and/or encounters for the requested time period, some results have not been displayed. A complete set of results can be found in Results Review.      Assessment & Plan:   Problem List Items Addressed This Visit     At risk for prolonged QT interval syndrome   Relevant Orders   EKG 12-Lead   Bipolar disorder, in full remission, most recent episode depressed - Primary   High risk medication use   Relevant Orders   EKG 12-Lead   Mixed hyperlipidemia   Relevant Orders   Lipid panel   Comprehensive metabolic panel with GFR   PAD (peripheral artery disease)   Relevant Orders   Lipid panel   PTSD (post-traumatic stress disorder)   Other Visit Diagnoses       Vitamin B12 deficiency         Elevated hemoglobin A1c         Essential hypertension            Evaluation of Prolonged QT interval risk monitoring High Risk Medication use per Psychiatry for Bipolar Disorder in remission Monitoring for prolonged QT interval due to Rexulti  use. Previous EKGs used as baseline for comparison. Current EKG ordered to ensure QTC remains normal to prevent arrhythmia. - Ordered EKG for QTC monitoring. Results are in normal range for QTc today - Faxed EKG results to Dr. Coby  Mixed hyperlipidemia Coronary Artery Disease Followed by  Cardiology Managed with Repatha  and two statins. No recent cholesterol check since starting Repatha . Goal to reduce statin use while maintaining control with Repatha . - Ordered cholesterol panel. - Ordered liver panel and chemistry.  Essential hypertension Blood pressure well-controlled without metoprolol . She prefers to avoid metoprolol  due to low readings.  General Health Maintenance Routine health  maintenance discussed. No thyroid  monitoring needed. Ongoing blood work and cholesterol monitoring. - Scheduled follow-up appointment in May for annual physical. - Ensured all medications are up to date with pharmacy.      Sinusitis Recent onset symptoms Discussed may warrant further treatment. She will contact me if worsening.  [facility] [address] [place],  [place]  [address] Main: [phone] Fax: [phone]  FAX TO Dr Coby RE: Requested EKG result   Orders Placed This Encounter  Procedures   Lipid panel    Has the patient fasted?:   Yes   Comprehensive metabolic panel with GFR    Has the patient fasted?:   Yes   EKG 12-Lead    No orders of the defined types were placed in this encounter.   Follow up plan: Return in about 5 months (around 05/18/2024) for 5 month fasting lab > 1 week later Annual Physical.  Future labs ordered for 05/2024  Marsa Officer, DO [facility] 12/19/2023, 9:31 AM     [1]  Social History Tobacco Use   Smoking status: Former    Current packs/day: 0.00    Average packs/day: 0.3 packs/day for 10.0 years (2.5 ttl pk-yrs)    Types: Cigarettes    Start date: 11/28/1985    Quit date: 11/29/1995    Years since quitting: 28.0    Passive exposure: Past   Smokeless tobacco: Former   Tobacco comments:    Quite 1997, didnt smoke hardly at all when I was smoking.  Vaping Use   Vaping status: Never Used  Substance Use Topics    Alcohol use: No   Drug use: No

## 2023-12-20 LAB — COMPREHENSIVE METABOLIC PANEL WITH GFR
AG Ratio: 2.1 (calc) (ref 1.0–2.5)
ALT: 31 U/L — ABNORMAL HIGH (ref 6–29)
AST: 35 U/L (ref 10–35)
Albumin: 4.4 g/dL (ref 3.6–5.1)
Alkaline phosphatase (APISO): 48 U/L (ref 37–153)
BUN: 16 mg/dL (ref 7–25)
CO2: 29 mmol/L (ref 20–32)
Calcium: 9.2 mg/dL (ref 8.6–10.4)
Chloride: 104 mmol/L (ref 98–110)
Creat: 0.79 mg/dL (ref 0.50–1.05)
Globulin: 2.1 g/dL (ref 1.9–3.7)
Glucose, Bld: 76 mg/dL (ref 65–99)
Potassium: 4.4 mmol/L (ref 3.5–5.3)
Sodium: 143 mmol/L (ref 135–146)
Total Bilirubin: 0.5 mg/dL (ref 0.2–1.2)
Total Protein: 6.5 g/dL (ref 6.1–8.1)
eGFR: 82 mL/min/1.73m2 (ref >=60)

## 2023-12-20 LAB — LIPID PANEL
Cholesterol: 102 mg/dL (ref <200)
HDL: 91 mg/dL (ref >=50)
Non-HDL Cholesterol (Calc): 11 mg/dL (ref <130)
Total CHOL/HDL Ratio: 1.1 (calc) (ref <5.0)
Triglycerides: 49 mg/dL (ref <150)

## 2023-12-21 ENCOUNTER — Other Ambulatory Visit: Payer: Self-pay | Admitting: Psychiatry

## 2023-12-21 DIAGNOSIS — F410 Panic disorder [episodic paroxysmal anxiety] without agoraphobia: Secondary | ICD-10-CM

## 2023-12-24 ENCOUNTER — Ambulatory Visit: Payer: Self-pay | Admitting: Family Medicine

## 2023-12-25 ENCOUNTER — Encounter: Payer: Self-pay | Admitting: Cardiology

## 2023-12-25 ENCOUNTER — Ambulatory Visit: Attending: Cardiology | Admitting: Cardiology

## 2023-12-25 VITALS — BP 108/62 | HR 77 | Ht 62.0 in | Wt 142.0 lb

## 2023-12-25 DIAGNOSIS — I2510 Atherosclerotic heart disease of native coronary artery without angina pectoris: Secondary | ICD-10-CM

## 2023-12-25 DIAGNOSIS — H34211 Partial retinal artery occlusion, right eye: Secondary | ICD-10-CM

## 2023-12-25 DIAGNOSIS — R42 Dizziness and giddiness: Secondary | ICD-10-CM

## 2023-12-25 DIAGNOSIS — E782 Mixed hyperlipidemia: Secondary | ICD-10-CM

## 2023-12-25 DIAGNOSIS — R011 Cardiac murmur, unspecified: Secondary | ICD-10-CM

## 2023-12-25 DIAGNOSIS — R0989 Other specified symptoms and signs involving the circulatory and respiratory systems: Secondary | ICD-10-CM | POA: Diagnosis not present

## 2023-12-25 DIAGNOSIS — Z87898 Personal history of other specified conditions: Secondary | ICD-10-CM

## 2023-12-25 MED ORDER — ROSUVASTATIN CALCIUM 20 MG PO TABS: 20.0000 mg | ORAL_TABLET | Freq: Every day | ORAL | Status: AC

## 2023-12-25 MED ORDER — EZETIMIBE 10 MG PO TABS: 10.0000 mg | ORAL_TABLET | Freq: Every day | ORAL | 3 refills | Status: AC

## 2023-12-25 NOTE — Patient Instructions (Signed)
 Medication Instructions:  Your physician recommends the following medication changes.  DECREASE: Rosuvastatin  to 20 mg  *If you need a refill on your cardiac medications before your next appointment, please call your pharmacy*  Lab Work: No labs ordered today  If you have labs (blood work) drawn today and your tests are completely normal, you will receive your results only by: MyChart Message (if you have MyChart) OR A paper copy in the mail If you have any lab test that is abnormal or we need to change your treatment, we will call you to review the results.  Testing/Procedures: No test ordered today   Follow-Up: At [facility], you and your health needs are our priority.  As part of our continuing mission to provide you with exceptional heart care, our providers are all part of one team.  This team includes your primary Cardiologist (physician) and Advanced Practice Providers or APPs (Physician Assistants and Nurse Practitioners) who all work together to provide you with the care you need, when you need it.  Your next appointment:   March - same day as pain clinic  Provider:   You may see Timothy Gollan, MD or one of the following Advanced Practice Providers on your designated Care Team:   Tylene Lunch, NP

## 2023-12-25 NOTE — Progress Notes (Signed)
 Cardiology Office Note   Date:  12/25/2023  ID:  Ashley Fields, DOB 04/28/1955, MRN [id number] PCP: Edman Marsa PARAS, DO  [facility] Providers Cardiologist:  Evalene Lunger, MD Cardiology APP:  Gerard Frederick, NP     History of Present Illness Ashley Fields is a 68 y.o. female with a past medical history of coronary calcification on CT, cirrhosis, bipolar disorder, prior tobacco abuse, hyperlipidemia, gastric bypass, Hollenhorst plaque (right eye), mild carotid artery disease, arthritis, depression, GERD, hepatitis C, aortic atherosclerosis, who presents today for follow-up.   Prior history of coronary calcification on CT scan with coronary calcium  score 2456, 99 percentile.  Since medical control, negative stress testing in 2019 and August 2022.  Echocardiogram completed 02/2022 with an LVEF 60 to 60%, no RWMA, mild LVH, and no evidence of valvular heart disease.  ZIO monitor completed 03/09/2022 revealed occasional paroxysmal SVT, no significant sustained arrhythmia.   She was last seen in clinic 03/28/2023 states that she had been having labile blood pressures.  Blood pressure relatively low in the mornings she wakes up with very high in the afternoon.  She denies chest pain, shortness of breath but does have occasional peripheral edema.  She notes that the bouts of low blood pressure she had dizziness and lightheadedness but denies any syncope or near syncopal episodes.  She also had an upcoming surgical procedure.  She was scheduled for a cardiac PET stress for surgical clearance which was denied by her insurance and she was changed to a Lexiscan  Myoview .   She was seen in clinic 06/24/2021 and been doing well.  Continues to suffer from labile blood pressures.  He previously undergone Lexiscan  MPI which unfortunately revealed abnormal probably primary ischemia.  With abnormal results she underwent a left heart catheterization via catheterization for mild to moderate nonobstructive  coronary disease with heavy mural calcification, normal left ventricular systolic function with LVEF 55-85% with mildly elevated filling pressure LVEDP 20 mmHg.  Continue medical therapy and risk factor modification to prevent progression of coronary artery disease was recommended.  She was evaluated in clinic 07/22/2023 where she had been doing fairly well since her heart catheterization.  She was sent for follow-up labs after having her procedure.  There were no other medication changes that were made and further testing that was ordered at that time.  She presented to [facility] department on 08/25/2023 of with hypotension and loss of consciousness.  She denied any chest pain or shortness of breath.  Had recently started on Repatha  and Toprol -XL.  Patient stated that she was taking her blood pressure today and noted it was low felt dizzy and lightheaded and passed out.  She states that her blood pressures were typically in the systolic 120s and systolic was 68.  Stated that she had previously been prescribed metoprolol  but had not taken it due to reading about blood pressure side effects.  EMS found her hypotensive with systolic in the 60s and she was given 1 L normal saline bolus.  Blood pressure was found to be 68/51, pulse of 65, respiration of 20, temperature 97.5.  Labs are reviewed and show a mild AKI, high-sensitivity troponin negative x 2, serum creatinine 1.15 of up from her baseline of 0.7.  GFR was down to 52.  She was diagnosed with dehydration CT of the cervical spine/CT with no evidence of acute intracranial abnormality no evidence of cervical spine fracture.  She was considered stable for discharge and was able to be discharged home.  She was last seen in clinic 09/28/2023 suffering from a migraine.  Denied any recurrent syncopal episodes.  Continues to have sharp stabbing chest discomfort in the left breast into the left axillary area on occasion with movement.  There were no  medication changes that were made or further testing that was ordered.    She returns to clinic today stating overall she has been doing well.  She has noted that when she walks for extended period of time she has had some lightheadedness and some dizziness.  She denies any syncopal episodes, shortness of breath, or chest pain.  States that she is working on staying hydrated and has added electrolytes to her fluids.  Blood pressure has been stable.  States that she has been compliant with her current medication regimen.  Denies any hospitalizations or visits to the emergency department.  ROS: 10 point review of system has been reviewed and considered negative the exception was been listed in the HPI  Studies Reviewed EKG Interpretation Date/Time:  Wednesday December 25 2023 14:05:12 EST Ventricular Rate:  77 PR Interval:  172 QRS Duration:  88 QT Interval:  372 QTC Calculation: 420 R Axis:   63  Text Interpretation: Normal sinus rhythm Normal ECG When compared with ECG of 08-Oct-2023 08:29, No significant change was found Confirmed by Gerard Frederick ([id number]) on 12/25/2023 2:09:55 PM    Carotid Duplex 09/26/2023 Summary:  Right Carotid: Velocities in the right ICA are consistent with a 1-39%  stenosis.   Left Carotid: Velocities in the left ICA are consistent with a 1-39%  stenosis.   Vertebrals: Bilateral vertebral arteries demonstrate antegrade flow.  Subclavians: Normal flow hemodynamics were seen in bilateral subclavian               arteries.    Event Monitor (Zio) 09/16/2023 Normal sinus rhythm Patient had a min HR of 57 bpm, max HR of 152 bpm, and avg HR of 76 bpm.    1 run of Supraventricular Tachycardia occurred lasting 4 beats with a max rate of 152 bpm (avg 126 bpm). Isolated SVEs were rare (<1.0%), SVE Couplets were rare (<1.0%), and no SVE Triplets were present.    Isolated VEs were rare (<1.0%), and no VE Couplets or VE Triplets were present. Inverted QRS complexes  possibly due to inverted placement of device.   Patient triggered events (18) associated with normal sinus rhythm   L[facility] 07/02/2023 Conclusions: Mild to moderate, non-obstructive coronary artery disease with heavy mural calcification, as detailed below. Normal left ventricular systolic function (LVEF 55-65%) with mildly elevated filling pressure (LVEDP 20 mmHg).   Recommendations: Continue medical therapy and risk factor modification to prevent progression of coronary artery disease.   Lexiscan  MPI 06/21/2023   Abnormal, probably low risk pharmacologic myocardial perfusion stress test.   There is a moderate in size, mild in severity, partially reversible defect.  The apical septal and apical defects are fixed and most likely represent artifact.  The apical anterior defect is reversible and suspicious for ischemia.   Left ventricular systolic function is normal (LVEF > 65%).   Coronary artery calcification is noted on the attenuation correction CT.   Sensitivity and specificity of the study are degraded by attenuation from breast implants and extracardiac activity.   Compared to the prior study from 08/08/2020, the apical anterior reversible defect appears new   Event Monitor (Zio) 03/09/2022 Conclusion Occasional paroxysmal SVT. No significant sustained arrhythmias. Paroxysmal SVT coincided with patient triggered events.   2D echo 02/28/2022  1. Left ventricular ejection fraction, by estimation, is 60 to 65%. The  left ventricle has normal function. The left ventricle has no regional  wall motion abnormalities. There is mild left ventricular hypertrophy.  Left ventricular diastolic function  could not be evaluated.   2. Right ventricular systolic function is normal. The right ventricular  size is normal.   3. The mitral valve is normal in structure. No evidence of mitral valve  regurgitation.   4. The aortic valve was not well visualized. Aortic valve regurgitation  is not visualized.    5. The inferior vena cava is normal in size with greater than 50%  respiratory variability, suggesting right atrial pressure of 3 mmHg.    Myoview  Lexicsan 08/2020 Narrative & Impression  There was no ST segment deviation noted during stress. No T wave inversion was noted during stress. Defect 1: There is a small defect of mild severity present in the apex location likely artifact. The study is normal. This is a low risk study. The left ventricular ejection fraction is normal (55-65%). CT attenuation images shows moderate aortic and coronary calcifications.     Myoview  lexiscan  2019 Narrative & Impression  Pharmacological myocardial perfusion imaging study with no significant  ischemia Normal wall motion, EF estimated at 83% No EKG changes concerning for ischemia at peak stress or in recovery. Low risk scan    Cardiac CT scoring 2019 IMPRESSION: Coronary calcium  score of 2456 . This was 27 th percentile for age and sex matched control.   Consider f/u heart catheterization or perfusion study given how extensive the coronary calcification is  Risk Assessment/Calculations       Physical Exam VS:  BP 108/62 (BP Location: Left Arm, Patient Position: Sitting, Cuff Size: Normal)   Pulse 77   Ht 5' 2 (1.575 m)   Wt 142 lb (64.4 kg)   SpO2 99%   BMI 25.97 kg/m         Wt Readings from Last 3 Encounters:  12/25/23 142 lb (64.4 kg)  12/19/23 149 lb (67.6 kg)  10/16/23 144 lb 4 oz (65.4 kg)    GEN: Well nourished, well developed in no acute distress NECK: No JVD; No carotid bruits CARDIAC: RRR, II/VI systolic murmur RUSB,without rubs or gallops RESPIRATORY:  Clear to auscultation without rales, wheezing or rhonchi  ABDOMEN: Soft, non-tender, non-distended EXTREMITIES:  No edema; No deformity   ASSESSMENT AND PLAN Nonobstructive coronary artery disease with previous left heart catheterization revealing mild to moderate nonobstructive disease with a heavy mural calcification.   She denies any anginal or anginal equivalents today.  EKG reveals sinus rhythm with rate of 77 with no acute ischemic changes.  She is continued on aspirin  81 mg daily rosuvastatin  which has been reduced to 20 mg daily, ezetimibe  10 mg daily rather to 40 mg every 2 weeks.  No further ischemic evaluation needed at this time.  Lightheadedness and dizziness with labile blood pressure pressure which she is not orthostatic today.  She has continued on furosemide  20 mg as needed for swelling but is not required using it.  Mixed hyperlipidemia with her LDL unable to be measured on her last lipid panel with her PCP.  She has continuously remained at goal less than 55.  Rosuvastatin  was decreased from 40 mg daily to 20 mg daily, she is continued on ezetimibe  10 mg daily and Repatha  140 mg every 2 weeks.  Heart murmur with prior echocardiogram revealed an LVEF of 60 to 65%, no RWMA, mild  LVH, no valvular abnormalities.  Will continue to monitor with surveillance studies.  Hollenhorst plaque on the right with previous carotid duplex revealing minimal 1% stenosis bilateral disease.  Cholesterol remains at goal she is continued on aspirin  and statin therapy.  Will need to continue with surveillance studies.  History of vasovagal syncope workup has been unrevealing.  No recurrent episodes.       Dispo: Patient to return to clinic to see me/APP in 3 months or sooner if needed for further evaluation.  Signed, Alanna Storti, NP

## 2023-12-26 ENCOUNTER — Ambulatory Visit: Admitting: Professional Counselor

## 2023-12-26 DIAGNOSIS — F410 Panic disorder [episodic paroxysmal anxiety] without agoraphobia: Secondary | ICD-10-CM

## 2023-12-26 NOTE — Progress Notes (Unsigned)
°  THERAPIST PROGRESS NOTE  Session Time: 1:00 PM - 1:55 PM   Participation Level: Active  Behavioral Response: Well Groomed, Alert, Anxious  Type of Therapy: Individual Therapy  Treatment Goals addressed: Active BH CCP BIPOLAR DISORDER  LTG: When I don't have to take all this medication. I just want to feel better, not feel so depressed, so anxious. I want to be able to have a normal day. Just not feeling anxious, depressed, other people have normal days, they look happy, laughing.                 Start:  07/29/23    Expected End:  07/27/24      STG: My daughter-in-law and I might get along one day. To improve relationships AEB self-report of improvement by using interpersonal effectiveness skills over the next 90 days.     STG: To not have this level of anxiousness I feel to not have panic attacks so often. To reduce sxs of anxiety AEB reduction of GAD7 scores by using coping mechanisms over the next 90 days.     STG: If I let that anger go and I get depressed and I can get so mad, I'll lose control. I just want to destroy everything. To improve emotion regulation AEB self-report reduction in outbursts by using coping mechanisms over the next 90 days.   ProgressTowards Goals: Progressing  Interventions: Motivational Interviewing and Supportive  Summary: Tzippy Testerman is a 68 y.o. female who presents with a history of panic disorder and bipolar disorder. .   Therapist Response: Conducted session with . Began session with check-in/update since previous session. Utilized empathetic and reflective listening. Used open-ended questions to facilitate discussion and summarized thoughts/feelings. Scheduled additional appointment and concluded session.   Suicidal/Homicidal: No  Plan: Discharge from therapy due to relocation to [place]   Diagnosis: Panic disorder  Collaboration of Care: Medication Management AEB chart review  Patient/Guardian was advised Release of Information must be  obtained prior to any record release in order to collaborate their care with an outside provider. Patient/Guardian was advised if they have not already done so to contact the registration department to sign all necessary forms in order for us  to release information regarding their care.   Consent: Patient/Guardian gives verbal consent for treatment and assignment of benefits for services provided during this visit. Patient/Guardian expressed understanding and agreed to proceed.   Almarie JONETTA Ligas, [facility] 12/26/2023

## 2023-12-27 ENCOUNTER — Other Ambulatory Visit: Payer: Self-pay | Admitting: Family Medicine

## 2023-12-27 DIAGNOSIS — J329 Chronic sinusitis, unspecified: Secondary | ICD-10-CM

## 2023-12-27 DIAGNOSIS — H6993 Unspecified Eustachian tube disorder, bilateral: Secondary | ICD-10-CM

## 2023-12-31 NOTE — Telephone Encounter (Signed)
 Too soon for refill.  Requested Prescriptions  Pending Prescriptions Disp Refills   Azelastine  HCl 137 MCG/SPRAY SOLN [Pharmacy Med Name: AZELASTINE  0.1% (137 MCG) SPRY]  1    Sig: PLACE 2 SPRAYS INTO BOTH NOSTRILS 2 (TWO) TIMES DAILY. USE IN EACH NOSTRIL AS DIRECTED     Ear, Nose, and Throat: Nasal Preparations - Antiallergy Passed - 12/31/2023  4:05 PM      Passed - Valid encounter within last 12 months    Recent Outpatient Visits           1 week ago Bipolar disorder, in full remission, most recent episode depressed   [place] [facility] [place], Marsa PARAS, DO   2 months ago Acute non-recurrent frontal sinusitis   [place] [facility] [place], Marsa PARAS, DO   3 months ago Rib pain   [place] [facility] [place],  A, MD   4 months ago Hypotension, unspecified hypotension type   [facility] [place] Marsa PARAS, DO   8 months ago Annual physical exam   [place] [facility] [place] Marsa PARAS, DO       Future Appointments             In 2 months Gollan, Timothy J, MD [facility]

## 2024-01-01 DIAGNOSIS — M461 Sacroiliitis, not elsewhere classified: Secondary | ICD-10-CM | POA: Diagnosis not present

## 2024-01-05 ENCOUNTER — Other Ambulatory Visit: Payer: Self-pay | Admitting: Family Medicine

## 2024-01-05 DIAGNOSIS — Z90710 Acquired absence of both cervix and uterus: Secondary | ICD-10-CM

## 2024-01-05 DIAGNOSIS — G8929 Other chronic pain: Secondary | ICD-10-CM

## 2024-01-07 ENCOUNTER — Ambulatory Visit: Admitting: Cardiovascular Disease

## 2024-01-07 NOTE — Telephone Encounter (Signed)
 Requested medication (s) are due for refill today: Yes  Requested medication (s) are on the active medication list: Yes  Last refill:  07/16/23  Future visit scheduled: No  Notes to clinic:  Not delegated.    Requested Prescriptions  Pending Prescriptions Disp Refills   tiZANidine  (ZANAFLEX ) 4 MG tablet [Pharmacy Med Name: TIZANIDINE  HCL 4 MG TABLET] 360 tablet 1    Sig: TAKE 1 TABLET (4 MG TOTAL) BY MOUTH 4 (FOUR) TIMES DAILY.     Not Delegated - Cardiovascular:  Alpha-2 Agonists - tizanidine  Failed - 01/07/2024 12:36 PM      Failed - This refill cannot be delegated      Passed - Valid encounter within last 6 months    Recent Outpatient Visits           2 weeks ago Bipolar disorder, in full remission, most recent episode depressed   [place] [facility] [place], Marsa PARAS, DO   2 months ago Acute non-recurrent frontal sinusitis   [place] [facility] PARAS, DO   4 months ago Rib pain   [place] [facility] [place] A, MD   4 months ago Hypotension, unspecified hypotension type   [place] [facility] [place] Marsa PARAS, DO   8 months ago Annual physical exam   [place] [facility] [place] [facility]a PARAS, DO       Future Appointments             In 2 months Gollan, Timothy J, MD [facility]            Signed Prescriptions Disp Refills   estradiol  (ESTRACE ) 1 MG tablet 90 tablet 1    Sig: TAKE 1 TABLET BY MOUTH EVERY DAY     OB/GYN:  Estrogens Passed - 01/07/2024 12:36 PM      Passed - Mammogram is up-to-date per Health Maintenance      Passed - Last BP in normal range    BP Readings from Last 1 Encounters:  12/25/23 108/62         Passed - Valid encounter within last 12 months    Recent Outpatient Visits           2 weeks ago Bipolar disorder, in full remission, most recent episode depressed    [place] [facility] [place], Marsa PARAS, DO   2 months ago Acute non-recurrent frontal sinusitis   [place] [facility] [place], Marsa PARAS, DO   4 months ago Rib pain   [place] [facility] [place], [place] A, MD   4 months ago Hypotension, unspecified hypotension type   [facility] [place] Marsa PARAS, DO   8 months ago Annual physical exam   [place] [facility] [place] Marsa PARAS, DO       Future Appointments             In 2 months Gollan, Timothy J, MD [facility]

## 2024-01-07 NOTE — Telephone Encounter (Signed)
 Requested Prescriptions  Pending Prescriptions Disp Refills   estradiol  (ESTRACE ) 1 MG tablet [Pharmacy Med Name: ESTRADIOL  1 MG TABLET] 90 tablet 1    Sig: TAKE 1 TABLET BY MOUTH EVERY DAY     OB/GYN:  Estrogens Passed - 01/07/2024 12:35 PM      Passed - Mammogram is up-to-date per Health Maintenance      Passed - Last BP in normal range    BP Readings from Last 1 Encounters:  12/25/23 108/62         Passed - Valid encounter within last 12 months    Recent Outpatient Visits           2 weeks ago Bipolar disorder, in full remission, most recent episode depressed   [place] [facility] [place], Marsa PARAS, DO   2 months ago Acute non-recurrent frontal sinusitis   [place] [facility] [place], Marsa PARAS, DO   4 months ago Rib pain   [place] [facility] [place], [place] A, MD   4 months ago Hypotension, unspecified hypotension type   [place] [facility] [place] [facility]a PARAS, DO   8 months ago Annual physical exam   [place] [facility] [place] Marsa PARAS, DO       Future Appointments             In 2 months Gollan, Timothy J, MD [place] [facility]             tiZANidine  (ZANAFLEX ) 4 MG tablet [Pharmacy Med Name: TIZANIDINE  HCL 4 MG TABLET] 360 tablet 1    Sig: TAKE 1 TABLET (4 MG TOTAL) BY MOUTH 4 (FOUR) TIMES DAILY.     Not Delegated - Cardiovascular:  Alpha-2 Agonists - tizanidine  Failed - 01/07/2024 12:35 PM      Failed - This refill cannot be delegated      Passed - Valid encounter within last 6 months    Recent Outpatient Visits           2 weeks ago Bipolar disorder, in full remission, most recent episode depressed   [place] [facility] [place], Marsa PARAS, DO   2 months ago Acute non-recurrent frontal sinusitis   [place] [facility] [place] [facility]a PARAS, DO   4 months ago Rib  pain   [place] [facility] [place] A, MD   4 months ago Hypotension, unspecified hypotension type   [employer] [facility] [place] Marsa PARAS, DO   8 months ago Annual physical exam   [place] [facility] [facility]a PARAS, DO       Future Appointments             In 2 months Gollan, Timothy J, MD [facility]

## 2024-01-11 ENCOUNTER — Other Ambulatory Visit: Payer: Self-pay | Admitting: Family Medicine

## 2024-01-11 DIAGNOSIS — J3089 Other allergic rhinitis: Secondary | ICD-10-CM

## 2024-01-12 ENCOUNTER — Other Ambulatory Visit: Payer: Self-pay | Admitting: Family Medicine

## 2024-01-12 DIAGNOSIS — H60543 Acute eczematoid otitis externa, bilateral: Secondary | ICD-10-CM

## 2024-01-13 NOTE — Telephone Encounter (Signed)
 Requested Prescriptions  Pending Prescriptions Disp Refills   fluticasone  (FLONASE ) 50 MCG/ACT nasal spray [Pharmacy Med Name: FLUTICASONE  PROP 50 MCG SPRAY] 48 mL 0    Sig: SPRAY 2 SPRAYS INTO EACH NOSTRIL EVERY DAY     Ear, Nose, and Throat: Nasal Preparations - Corticosteroids Passed - 01/13/2024  2:37 PM      Passed - Valid encounter within last 12 months    Recent Outpatient Visits           3 weeks ago Bipolar disorder, in full remission, most recent episode depressed   [place] [facility] [place], Marsa PARAS, DO   2 months ago Acute non-recurrent frontal sinusitis   [place] [facility] [place], Marsa PARAS, DO   4 months ago Rib pain   [place] [facility] [place], [place] A, MD   4 months ago Hypotension, unspecified hypotension type   [facility] [place] [facility]a PARAS, DO   8 months ago Annual physical exam   [place] [facility] [place] Marsa PARAS, DO       Future Appointments             In 2 months Gollan, Timothy J, MD [facility] at [facility]

## 2024-01-14 NOTE — Telephone Encounter (Signed)
 Requested medication (s) are due for refill today: yes  Requested medication (s) are on the active medication list: yes  Last refill:  10/08/23  Future visit scheduled: {no  Notes to clinic:  Unable to refill per protocol, cannot delegate.      Requested Prescriptions  Pending Prescriptions Disp Refills   triamcinolone  ointment (KENALOG ) 0.5 % [Pharmacy Med Name: TRIAMCINOLONE  0.5% OINTMENT] 30 g 2    Sig: APPLY 1 APPLICATION TOPICALLY 2 (TWO) TIMES DAILY. APPLY TO EXTERNAL EAR FOR 2 WEEKS     Not Delegated - Dermatology:  Corticosteroids Failed - 01/14/2024 10:48 AM      Failed - This refill cannot be delegated      Passed - Valid encounter within last 12 months    Recent Outpatient Visits           3 weeks ago Bipolar disorder, in full remission, most recent episode depressed   [place] [facility] [place], Marsa PARAS, DO   3 months ago Acute non-recurrent frontal sinusitis   [place] [facility] [place] [facility]a PARAS, DO   4 months ago Rib pain   [place] [facility] [place] [place] A, MD   4 months ago Hypotension, unspecified hypotension type   [facility] [place] [facility] PARAS, DO   8 months ago Annual physical exam   [place] [facility] [place] [facility]a PARAS, DO       Future Appointments             In 2 months Gollan, Timothy J, MD [facility]

## 2024-01-23 ENCOUNTER — Other Ambulatory Visit: Payer: Self-pay | Admitting: Family Medicine

## 2024-01-23 DIAGNOSIS — E538 Deficiency of other specified B group vitamins: Secondary | ICD-10-CM

## 2024-01-23 NOTE — Telephone Encounter (Signed)
 Requested Prescriptions  Pending Prescriptions Disp Refills   cyanocobalamin  (VITAMIN B12) 1000 MCG/ML injection [Pharmacy Med Name: CYANOCOBALAMIN  1,000 MCG/ML VL] 3 mL 0    Sig: INJECT 1 ML (1,000 MCG TOTAL) INTO THE MUSCLE EVERY 30 DAYS.     Endocrinology:  Vitamins - Vitamin B12 Passed - 01/23/2024  4:26 PM      Passed - HCT in normal range and within 360 days    HCT  Date Value Ref Range Status  09/18/2023 40.2 36.0 - 46.0 % Final   Hematocrit  Date Value Ref Range Status  06/25/2023 39.5 34.0 - 46.6 % Final         Passed - HGB in normal range and within 360 days    Hemoglobin  Date Value Ref Range Status  09/18/2023 13.4 12.0 - 15.0 g/dL Final  93/82/7974 86.9 11.1 - 15.9 g/dL Final         Passed - B12 Level in normal range and within 360 days    Vitamin B-12  Date Value Ref Range Status  09/18/2023 788 180 - 914 pg/mL Final    Comment:    (NOTE) This assay is not validated for testing neonatal or myeloproliferative syndrome specimens for Vitamin B12 levels. Performed at [facility], [address], [place], [place] [address]          Passed - Valid encounter within last 12 months    Recent Outpatient Visits           1 month ago Bipolar disorder, in full remission, most recent episode depressed   [place] [facility] [place], Marsa PARAS, DO   3 months ago Acute non-recurrent frontal sinusitis   [place] [facility] [place] [facility] PARAS, DO   4 months ago Rib pain   [place] [facility] [place] A, MD   4 months ago Hypotension, unspecified hypotension type   [facility] [place] Marsa PARAS, DO   8 months ago Annual physical exam   [place] [facility] [place] Marsa PARAS, DO       Future Appointments             In 1 month Gollan, Timothy J, MD [facility]

## 2024-01-24 ENCOUNTER — Other Ambulatory Visit: Payer: Self-pay | Admitting: Family Medicine

## 2024-01-24 DIAGNOSIS — R110 Nausea: Secondary | ICD-10-CM

## 2024-01-24 DIAGNOSIS — J0110 Acute frontal sinusitis, unspecified: Secondary | ICD-10-CM

## 2024-01-24 NOTE — Telephone Encounter (Signed)
 Requested medication (s) are due for refill today: yes  Requested medication (s) are on the active medication list: yes  Last refill:  multiple dates  Future visit scheduled: yes  Notes to clinic:  Unable to refill per protocol, cannot delegate.      Requested Prescriptions  Pending Prescriptions Disp Refills   amoxicillin -clavulanate (AUGMENTIN ) 875-125 MG tablet [Pharmacy Med Name: AMOXICILLIN -CLAV 875-125MG  TAB] 20 tablet 0    Sig: TAKE 1 TABLET BY MOUTH TWICE A DAY     Off-Protocol Failed - 01/24/2024  1:35 PM      Failed - Medication not assigned to a protocol, review manually.      Passed - Valid encounter within last 12 months    Recent Outpatient Visits           1 month ago Bipolar disorder, in full remission, most recent episode depressed   [place] [facility] [place], Marsa PARAS, DO   3 months ago Acute non-recurrent frontal sinusitis   [place] [facility] [place], Marsa PARAS, DO   4 months ago Rib pain   [place] [facility] [place] A, MD   4 months ago Hypotension, unspecified hypotension type   [place] [facility] [place], Marsa PARAS, DO   8 months ago Annual physical exam   [place] [facility] [place] Marsa PARAS, DO       Future Appointments             In 1 month Gollan, Evalene PARAS, MD [place] [facility]             ondansetron  (ZOFRAN -ODT) 4 MG disintegrating tablet [Pharmacy Med Name: ONDANSETRON  ODT 4 MG TABLET] 30 tablet 2    Sig: TAKE 1 TABLET BY MOUTH EVERY 8 HOURS AS NEEDED FOR NAUSEA AND VOMITING     Not Delegated - Gastroenterology: Antiemetics - ondansetron  Failed - 01/24/2024  1:35 PM      Failed - This refill cannot be delegated      Failed - ALT in normal range and within 360 days    ALT  Date Value Ref Range Status  12/19/2023 31 (H) 6 - 29 U/L Final  09/18/2023 21 0 - 44 U/L Final          Passed - AST in normal range and within 360 days    AST  Date Value Ref Range Status  12/19/2023 35 10 - 35 U/L Final  09/18/2023 34 15 - 41 U/L Final         Passed - Valid encounter within last 6 months    Recent Outpatient Visits           1 month ago Bipolar disorder, in full remission, most recent episode depressed   [place] [facility] [place], Marsa PARAS, DO   3 months ago Acute non-recurrent frontal sinusitis   [place] [facility] [place], Marsa PARAS, DO   4 months ago Rib pain   [place] [facility] [place], [place] A, MD   4 months ago Hypotension, unspecified hypotension type   [facility] [place] Marsa PARAS, DO   8 months ago Annual physical exam   [place] [facility] [facility]a PARAS, DO       Future Appointments             In 1 month Gollan, Timothy  J, MD [place] [facility]

## 2024-01-30 ENCOUNTER — Ambulatory Visit: Admitting: Nurse Practitioner

## 2024-02-07 ENCOUNTER — Encounter: Payer: Self-pay | Admitting: Family Medicine

## 2024-02-13 DIAGNOSIS — F3162 Bipolar disorder, current episode mixed, moderate: Secondary | ICD-10-CM

## 2024-02-13 MED ORDER — REXULTI 0.5 MG PO TABS: 0.5000 mg | ORAL_TABLET | Freq: Every day | ORAL | 0 refills | Status: AC

## 2024-02-13 NOTE — Telephone Encounter (Signed)
 I have sent a higher dose of Rexulti  per request by patient to local pharmacy at [facility].  A MyChart message has been sent to patient regarding this.  I have also sent message to staff regarding her request for psychotherapy visit.

## 2024-03-17 ENCOUNTER — Other Ambulatory Visit

## 2024-03-17 ENCOUNTER — Ambulatory Visit: Admitting: Psychiatry

## 2024-03-17 ENCOUNTER — Ambulatory Visit: Admitting: Cardiovascular Disease

## 2024-03-19 ENCOUNTER — Ambulatory Visit: Admitting: Psychiatry

## 2024-03-20 ENCOUNTER — Ambulatory Visit

## 2024-03-20 ENCOUNTER — Ambulatory Visit: Admitting: Internal Medicine

## 2024-05-19 ENCOUNTER — Other Ambulatory Visit

## 2024-05-26 ENCOUNTER — Encounter: Admitting: Family Medicine
